# Patient Record
Sex: Female | Born: 1955
Health system: Southern US, Community
[De-identification: ages and names within clinical notes are randomized; demographics above are authoritative.]

## PROBLEM LIST (undated history)

## (undated) DIAGNOSIS — N951 Menopausal and female climacteric states: Secondary | ICD-10-CM

## (undated) DIAGNOSIS — N926 Irregular menstruation, unspecified: Secondary | ICD-10-CM

## (undated) DIAGNOSIS — IMO0001 Reserved for inherently not codable concepts without codable children: Secondary | ICD-10-CM

## (undated) DIAGNOSIS — N762 Acute vulvitis: Secondary | ICD-10-CM

## (undated) DIAGNOSIS — D869 Sarcoidosis, unspecified: Secondary | ICD-10-CM

## (undated) DIAGNOSIS — E538 Deficiency of other specified B group vitamins: Secondary | ICD-10-CM

## (undated) DIAGNOSIS — I872 Venous insufficiency (chronic) (peripheral): Secondary | ICD-10-CM

## (undated) DIAGNOSIS — I10 Essential (primary) hypertension: Secondary | ICD-10-CM

## (undated) DIAGNOSIS — B379 Candidiasis, unspecified: Secondary | ICD-10-CM

## (undated) DIAGNOSIS — Z8619 Personal history of other infectious and parasitic diseases: Secondary | ICD-10-CM

## (undated) DIAGNOSIS — R42 Dizziness and giddiness: Secondary | ICD-10-CM

## (undated) DIAGNOSIS — R51 Headache: Secondary | ICD-10-CM

## (undated) DIAGNOSIS — F419 Anxiety disorder, unspecified: Secondary | ICD-10-CM

## (undated) DIAGNOSIS — I219 Acute myocardial infarction, unspecified: Secondary | ICD-10-CM

## (undated) DIAGNOSIS — K589 Irritable bowel syndrome without diarrhea: Secondary | ICD-10-CM

## (undated) DIAGNOSIS — J209 Acute bronchitis, unspecified: Secondary | ICD-10-CM

## (undated) DIAGNOSIS — H8109 Meniere's disease, unspecified ear: Secondary | ICD-10-CM

## (undated) DIAGNOSIS — G4733 Obstructive sleep apnea (adult) (pediatric): Secondary | ICD-10-CM

## (undated) DIAGNOSIS — E119 Type 2 diabetes mellitus without complications: Secondary | ICD-10-CM

## (undated) DIAGNOSIS — K635 Polyp of colon: Secondary | ICD-10-CM

## (undated) DIAGNOSIS — Z8742 Personal history of other diseases of the female genital tract: Secondary | ICD-10-CM

## (undated) DIAGNOSIS — N200 Calculus of kidney: Secondary | ICD-10-CM

## (undated) DIAGNOSIS — N760 Acute vaginitis: Secondary | ICD-10-CM

## (undated) DIAGNOSIS — B009 Herpesviral infection, unspecified: Secondary | ICD-10-CM

## (undated) DIAGNOSIS — E7800 Pure hypercholesterolemia, unspecified: Secondary | ICD-10-CM

## (undated) DIAGNOSIS — I2510 Atherosclerotic heart disease of native coronary artery without angina pectoris: Secondary | ICD-10-CM

## (undated) DIAGNOSIS — M797 Fibromyalgia: Secondary | ICD-10-CM

## (undated) DIAGNOSIS — J309 Allergic rhinitis, unspecified: Secondary | ICD-10-CM

## (undated) DIAGNOSIS — B9689 Other specified bacterial agents as the cause of diseases classified elsewhere: Secondary | ICD-10-CM

## (undated) DIAGNOSIS — D649 Anemia, unspecified: Secondary | ICD-10-CM

## (undated) DIAGNOSIS — D219 Benign neoplasm of connective and other soft tissue, unspecified: Secondary | ICD-10-CM

## (undated) HISTORY — DX: Meniere's disease, unspecified ear: H81.09

## (undated) HISTORY — DX: Fibromyalgia: M79.7

## (undated) HISTORY — DX: Sarcoidosis, unspecified: D86.9

## (undated) HISTORY — DX: Calculus of kidney: N20.0

## (undated) HISTORY — PX: COLONOSCOPY: SHX174

## (undated) HISTORY — DX: Herpesviral infection, unspecified: B00.9

## (undated) HISTORY — DX: Anxiety disorder, unspecified: F41.9

## (undated) HISTORY — DX: Polyp of colon: K63.5

## (undated) HISTORY — DX: Essential (primary) hypertension: I10

## (undated) HISTORY — DX: Candidiasis, unspecified: B37.9

## (undated) HISTORY — DX: Atherosclerotic heart disease of native coronary artery without angina pectoris: I25.10

## (undated) HISTORY — DX: Acute myocardial infarction, unspecified: I21.9

## (undated) HISTORY — DX: Venous insufficiency (chronic) (peripheral): I87.2

## (undated) HISTORY — DX: Obstructive sleep apnea (adult) (pediatric): G47.33

## (undated) HISTORY — DX: Type 2 diabetes mellitus without complications: E11.9

## (undated) HISTORY — DX: Acute bronchitis, unspecified: J20.9

## (undated) HISTORY — PX: OTHER SURGICAL HISTORY: SHX169

## (undated) HISTORY — DX: Irritable bowel syndrome, unspecified: K58.9

## (undated) HISTORY — DX: Headache: R51

## (undated) HISTORY — DX: Other specified bacterial agents as the cause of diseases classified elsewhere: B96.89

## (undated) HISTORY — DX: Deficiency of other specified B group vitamins: E53.8

## (undated) HISTORY — DX: Pure hypercholesterolemia, unspecified: E78.00

## (undated) HISTORY — DX: Menopausal and female climacteric states: N95.1

## (undated) HISTORY — DX: Reserved for inherently not codable concepts without codable children: IMO0001

## (undated) HISTORY — DX: Anemia, unspecified: D64.9

## (undated) HISTORY — DX: Personal history of other infectious and parasitic diseases: Z86.19

## (undated) HISTORY — DX: Acute vaginitis: N76.0

## (undated) HISTORY — DX: Benign neoplasm of connective and other soft tissue, unspecified: D21.9

## (undated) HISTORY — PX: TONSILLECTOMY: SUR1361

## (undated) HISTORY — DX: Acute vulvitis: N76.2

## (undated) HISTORY — DX: Personal history of other diseases of the female genital tract: Z87.42

## (undated) HISTORY — DX: Allergic rhinitis, unspecified: J30.9

## (undated) HISTORY — DX: Dizziness and giddiness: R42

## (undated) HISTORY — DX: Irregular menstruation, unspecified: N92.6

---

## 1996-06-22 DIAGNOSIS — B9689 Other specified bacterial agents as the cause of diseases classified elsewhere: Secondary | ICD-10-CM

## 1996-06-22 HISTORY — DX: Other specified bacterial agents as the cause of diseases classified elsewhere: B96.89

## 1999-03-26 ENCOUNTER — Ambulatory Visit (HOSPITAL_COMMUNITY): Admission: RE | Admit: 1999-03-26 | Discharge: 1999-03-26 | Payer: Self-pay | Admitting: *Deleted

## 2000-11-10 ENCOUNTER — Ambulatory Visit (HOSPITAL_COMMUNITY): Admission: RE | Admit: 2000-11-10 | Discharge: 2000-11-10 | Payer: Self-pay | Admitting: *Deleted

## 2000-11-10 ENCOUNTER — Encounter: Payer: Self-pay | Admitting: *Deleted

## 2001-02-01 ENCOUNTER — Encounter: Payer: Self-pay | Admitting: Emergency Medicine

## 2001-02-01 ENCOUNTER — Emergency Department (HOSPITAL_COMMUNITY): Admission: EM | Admit: 2001-02-01 | Discharge: 2001-02-01 | Payer: Self-pay | Admitting: Emergency Medicine

## 2001-11-15 DIAGNOSIS — N951 Menopausal and female climacteric states: Secondary | ICD-10-CM

## 2001-11-15 DIAGNOSIS — D219 Benign neoplasm of connective and other soft tissue, unspecified: Secondary | ICD-10-CM

## 2001-11-15 DIAGNOSIS — N926 Irregular menstruation, unspecified: Secondary | ICD-10-CM

## 2001-11-15 HISTORY — DX: Menopausal and female climacteric states: N95.1

## 2001-11-15 HISTORY — DX: Irregular menstruation, unspecified: N92.6

## 2001-11-15 HISTORY — DX: Benign neoplasm of connective and other soft tissue, unspecified: D21.9

## 2002-08-02 ENCOUNTER — Other Ambulatory Visit: Admission: RE | Admit: 2002-08-02 | Discharge: 2002-08-02 | Payer: Self-pay | Admitting: Obstetrics and Gynecology

## 2002-08-15 ENCOUNTER — Ambulatory Visit (HOSPITAL_COMMUNITY): Admission: RE | Admit: 2002-08-15 | Discharge: 2002-08-15 | Payer: Self-pay | Admitting: Obstetrics and Gynecology

## 2002-08-15 ENCOUNTER — Encounter: Payer: Self-pay | Admitting: Obstetrics and Gynecology

## 2002-10-15 ENCOUNTER — Encounter: Payer: Self-pay | Admitting: Pulmonary Disease

## 2002-10-15 ENCOUNTER — Ambulatory Visit (HOSPITAL_COMMUNITY): Admission: RE | Admit: 2002-10-15 | Discharge: 2002-10-15 | Payer: Self-pay | Admitting: Pulmonary Disease

## 2003-11-07 ENCOUNTER — Other Ambulatory Visit: Admission: RE | Admit: 2003-11-07 | Discharge: 2003-11-07 | Payer: Self-pay | Admitting: Obstetrics and Gynecology

## 2004-10-23 ENCOUNTER — Ambulatory Visit: Payer: Self-pay | Admitting: Adult Health

## 2004-11-02 ENCOUNTER — Encounter: Admission: RE | Admit: 2004-11-02 | Discharge: 2005-01-31 | Payer: Self-pay | Admitting: Pulmonary Disease

## 2004-11-30 ENCOUNTER — Ambulatory Visit: Payer: Self-pay | Admitting: Pulmonary Disease

## 2004-12-21 ENCOUNTER — Other Ambulatory Visit: Admission: RE | Admit: 2004-12-21 | Discharge: 2004-12-21 | Payer: Self-pay | Admitting: Obstetrics and Gynecology

## 2004-12-21 ENCOUNTER — Ambulatory Visit: Payer: Self-pay | Admitting: Pulmonary Disease

## 2005-02-18 ENCOUNTER — Encounter: Admission: RE | Admit: 2005-02-18 | Discharge: 2005-05-19 | Payer: Self-pay | Admitting: Pulmonary Disease

## 2005-02-23 ENCOUNTER — Ambulatory Visit: Payer: Self-pay | Admitting: Pulmonary Disease

## 2005-04-30 ENCOUNTER — Ambulatory Visit (HOSPITAL_BASED_OUTPATIENT_CLINIC_OR_DEPARTMENT_OTHER): Admission: RE | Admit: 2005-04-30 | Discharge: 2005-04-30 | Payer: Self-pay | Admitting: Pulmonary Disease

## 2005-05-14 ENCOUNTER — Ambulatory Visit: Payer: Self-pay | Admitting: Pulmonary Disease

## 2005-12-01 ENCOUNTER — Ambulatory Visit: Payer: Self-pay | Admitting: Pulmonary Disease

## 2006-01-21 ENCOUNTER — Other Ambulatory Visit: Admission: RE | Admit: 2006-01-21 | Discharge: 2006-01-21 | Payer: Self-pay | Admitting: Obstetrics and Gynecology

## 2006-01-28 ENCOUNTER — Ambulatory Visit: Payer: Self-pay | Admitting: Pulmonary Disease

## 2006-02-16 ENCOUNTER — Ambulatory Visit: Payer: Self-pay | Admitting: Pulmonary Disease

## 2006-03-01 ENCOUNTER — Ambulatory Visit: Payer: Self-pay | Admitting: Pulmonary Disease

## 2006-03-14 ENCOUNTER — Ambulatory Visit: Payer: Self-pay

## 2006-05-11 ENCOUNTER — Ambulatory Visit (HOSPITAL_BASED_OUTPATIENT_CLINIC_OR_DEPARTMENT_OTHER): Admission: RE | Admit: 2006-05-11 | Discharge: 2006-05-11 | Payer: Self-pay | Admitting: Pulmonary Disease

## 2006-05-13 ENCOUNTER — Ambulatory Visit: Payer: Self-pay | Admitting: Pulmonary Disease

## 2006-06-10 ENCOUNTER — Ambulatory Visit: Payer: Self-pay | Admitting: Pulmonary Disease

## 2006-06-17 ENCOUNTER — Ambulatory Visit: Payer: Self-pay | Admitting: Pulmonary Disease

## 2006-08-01 ENCOUNTER — Ambulatory Visit: Payer: Self-pay | Admitting: Internal Medicine

## 2006-08-09 ENCOUNTER — Ambulatory Visit: Payer: Self-pay | Admitting: Cardiovascular Disease

## 2006-08-17 ENCOUNTER — Ambulatory Visit: Payer: Self-pay | Admitting: Pulmonary Disease

## 2006-09-21 ENCOUNTER — Ambulatory Visit: Payer: Self-pay | Admitting: Pulmonary Disease

## 2006-10-03 ENCOUNTER — Ambulatory Visit: Payer: Self-pay | Admitting: Pulmonary Disease

## 2006-11-10 ENCOUNTER — Ambulatory Visit: Payer: Self-pay | Admitting: Pulmonary Disease

## 2006-11-15 DIAGNOSIS — Z8742 Personal history of other diseases of the female genital tract: Secondary | ICD-10-CM

## 2006-11-15 HISTORY — DX: Personal history of other diseases of the female genital tract: Z87.42

## 2007-03-21 ENCOUNTER — Ambulatory Visit: Payer: Self-pay | Admitting: Pulmonary Disease

## 2007-04-26 ENCOUNTER — Emergency Department (HOSPITAL_COMMUNITY): Admission: EM | Admit: 2007-04-26 | Discharge: 2007-04-26 | Payer: Self-pay | Admitting: Emergency Medicine

## 2007-04-27 ENCOUNTER — Ambulatory Visit: Payer: Self-pay | Admitting: Pulmonary Disease

## 2007-04-27 LAB — CONVERTED CEMR LAB
ALT: 15 units/L (ref 0–40)
AST: 21 units/L (ref 0–37)
Alkaline Phosphatase: 55 units/L (ref 39–117)
BUN: 11 mg/dL (ref 6–23)
Basophils Relative: 0.7 % (ref 0.0–1.0)
Bilirubin, Direct: 0.1 mg/dL (ref 0.0–0.3)
CO2: 26 meq/L (ref 19–32)
Calcium: 9.3 mg/dL (ref 8.4–10.5)
Chloride: 110 meq/L (ref 96–112)
Eosinophils Relative: 0.7 % (ref 0.0–5.0)
GFR calc Af Amer: 113 mL/min
Glucose, Bld: 100 mg/dL — ABNORMAL HIGH (ref 70–99)
Lymphocytes Relative: 31.4 % (ref 12.0–46.0)
Monocytes Relative: 12.8 % — ABNORMAL HIGH (ref 3.0–11.0)
Platelets: 279 10*3/uL (ref 150–400)
RBC: 4.05 M/uL (ref 3.87–5.11)
RDW: 13.5 % (ref 11.5–14.6)
TSH: 1.7 microintl units/mL (ref 0.35–5.50)
Total Protein: 6.4 g/dL (ref 6.0–8.3)
WBC: 5.4 10*3/uL (ref 4.5–10.5)

## 2007-04-28 ENCOUNTER — Ambulatory Visit: Payer: Self-pay

## 2007-05-05 ENCOUNTER — Emergency Department (HOSPITAL_COMMUNITY): Admission: EM | Admit: 2007-05-05 | Discharge: 2007-05-05 | Payer: Self-pay | Admitting: Emergency Medicine

## 2007-05-10 ENCOUNTER — Encounter (HOSPITAL_BASED_OUTPATIENT_CLINIC_OR_DEPARTMENT_OTHER): Admission: RE | Admit: 2007-05-10 | Discharge: 2007-05-26 | Payer: Self-pay | Admitting: Internal Medicine

## 2007-07-31 ENCOUNTER — Ambulatory Visit: Payer: Self-pay | Admitting: Pulmonary Disease

## 2007-07-31 LAB — CONVERTED CEMR LAB
Bilirubin Urine: NEGATIVE
Calcium: 9.2 mg/dL (ref 8.4–10.5)
Chloride: 111 meq/L (ref 96–112)
Creatinine, Ser: 0.7 mg/dL (ref 0.4–1.2)
GFR calc non Af Amer: 93 mL/min
Hgb A1c MFr Bld: 6.6 % — ABNORMAL HIGH (ref 4.6–6.0)
Leukocytes, UA: NEGATIVE
Specific Gravity, Urine: <=1.005 (ref 1.000–1.03)
Total Protein, Urine: NEGATIVE mg/dL
Urobilinogen, UA: 0.2 (ref 0.0–1.0)
pH: 6.5 (ref 5.0–8.0)

## 2007-08-02 ENCOUNTER — Ambulatory Visit: Payer: Self-pay | Admitting: Internal Medicine

## 2007-09-16 DIAGNOSIS — K589 Irritable bowel syndrome without diarrhea: Secondary | ICD-10-CM | POA: Insufficient documentation

## 2007-09-16 DIAGNOSIS — IMO0001 Reserved for inherently not codable concepts without codable children: Secondary | ICD-10-CM | POA: Insufficient documentation

## 2007-09-16 DIAGNOSIS — F411 Generalized anxiety disorder: Secondary | ICD-10-CM | POA: Insufficient documentation

## 2007-09-16 DIAGNOSIS — E7800 Pure hypercholesterolemia, unspecified: Secondary | ICD-10-CM | POA: Insufficient documentation

## 2007-09-16 DIAGNOSIS — I10 Essential (primary) hypertension: Secondary | ICD-10-CM | POA: Insufficient documentation

## 2007-09-16 DIAGNOSIS — J309 Allergic rhinitis, unspecified: Secondary | ICD-10-CM | POA: Insufficient documentation

## 2007-11-07 ENCOUNTER — Encounter: Payer: Self-pay | Admitting: Pulmonary Disease

## 2007-11-16 DIAGNOSIS — B009 Herpesviral infection, unspecified: Secondary | ICD-10-CM

## 2007-11-16 HISTORY — DX: Herpesviral infection, unspecified: B00.9

## 2007-12-12 ENCOUNTER — Ambulatory Visit: Payer: Self-pay | Admitting: Pulmonary Disease

## 2007-12-12 DIAGNOSIS — R42 Dizziness and giddiness: Secondary | ICD-10-CM | POA: Insufficient documentation

## 2007-12-12 DIAGNOSIS — I872 Venous insufficiency (chronic) (peripheral): Secondary | ICD-10-CM | POA: Insufficient documentation

## 2007-12-12 DIAGNOSIS — N200 Calculus of kidney: Secondary | ICD-10-CM | POA: Insufficient documentation

## 2007-12-13 ENCOUNTER — Ambulatory Visit: Payer: Self-pay | Admitting: Pulmonary Disease

## 2007-12-13 LAB — CONVERTED CEMR LAB
ALT: 14 units/L (ref 0–35)
Albumin: 3.4 g/dL — ABNORMAL LOW (ref 3.5–5.2)
BUN: 7 mg/dL (ref 6–23)
Basophils Absolute: 0.1 10*3/uL (ref 0.0–0.1)
Bilirubin, Direct: 0.2 mg/dL (ref 0.0–0.3)
Calcium: 9 mg/dL (ref 8.4–10.5)
Eosinophils Absolute: 0.1 10*3/uL (ref 0.0–0.6)
GFR calc Af Amer: 96 mL/min
GFR calc non Af Amer: 80 mL/min
Glucose, Bld: 112 mg/dL — ABNORMAL HIGH (ref 70–99)
HDL: 48.5 mg/dL (ref >39.0)
Lymphocytes Relative: 27.1 % (ref 12.0–46.0)
MCHC: 33.3 g/dL (ref 30.0–36.0)
MCV: 88.2 fL (ref 78.0–100.0)
Monocytes Relative: 9.9 % (ref 3.0–11.0)
Neutro Abs: 3.8 10*3/uL (ref 1.4–7.7)
Platelets: 293 10*3/uL (ref 150–400)
RBC: 4.44 M/uL (ref 3.87–5.11)
Total CHOL/HDL Ratio: 3.2
Triglycerides: 122 mg/dL (ref 0–149)

## 2007-12-15 ENCOUNTER — Ambulatory Visit: Payer: Self-pay | Admitting: Cardiology

## 2008-02-20 ENCOUNTER — Encounter: Payer: Self-pay | Admitting: Pulmonary Disease

## 2008-02-28 ENCOUNTER — Encounter: Payer: Self-pay | Admitting: Pulmonary Disease

## 2008-03-06 ENCOUNTER — Telehealth: Payer: Self-pay | Admitting: Pulmonary Disease

## 2008-03-08 ENCOUNTER — Telehealth (INDEPENDENT_AMBULATORY_CARE_PROVIDER_SITE_OTHER): Payer: Self-pay | Admitting: *Deleted

## 2008-04-10 ENCOUNTER — Encounter: Payer: Self-pay | Admitting: Pulmonary Disease

## 2008-07-18 ENCOUNTER — Ambulatory Visit (HOSPITAL_COMMUNITY): Admission: RE | Admit: 2008-07-18 | Discharge: 2008-07-18 | Payer: Self-pay | Admitting: Urology

## 2008-07-25 ENCOUNTER — Encounter: Payer: Self-pay | Admitting: Pulmonary Disease

## 2008-09-05 ENCOUNTER — Telehealth (INDEPENDENT_AMBULATORY_CARE_PROVIDER_SITE_OTHER): Payer: Self-pay | Admitting: *Deleted

## 2008-09-09 ENCOUNTER — Telehealth: Payer: Self-pay | Admitting: Pulmonary Disease

## 2008-09-10 ENCOUNTER — Ambulatory Visit: Payer: Self-pay | Admitting: Pulmonary Disease

## 2008-09-10 DIAGNOSIS — J209 Acute bronchitis, unspecified: Secondary | ICD-10-CM | POA: Insufficient documentation

## 2008-11-15 DIAGNOSIS — N762 Acute vulvitis: Secondary | ICD-10-CM

## 2008-11-15 HISTORY — DX: Acute vulvitis: N76.2

## 2008-11-15 LAB — HM COLONOSCOPY: HM Colonoscopy: NORMAL

## 2008-12-04 ENCOUNTER — Telehealth (INDEPENDENT_AMBULATORY_CARE_PROVIDER_SITE_OTHER): Payer: Self-pay | Admitting: *Deleted

## 2008-12-23 ENCOUNTER — Encounter: Payer: Self-pay | Admitting: Pulmonary Disease

## 2009-02-05 ENCOUNTER — Ambulatory Visit: Payer: Self-pay | Admitting: Pulmonary Disease

## 2009-02-05 DIAGNOSIS — E559 Vitamin D deficiency, unspecified: Secondary | ICD-10-CM | POA: Insufficient documentation

## 2009-02-07 ENCOUNTER — Encounter (INDEPENDENT_AMBULATORY_CARE_PROVIDER_SITE_OTHER): Payer: Self-pay | Admitting: *Deleted

## 2009-02-18 ENCOUNTER — Encounter: Payer: Self-pay | Admitting: Pulmonary Disease

## 2009-02-24 ENCOUNTER — Telehealth (INDEPENDENT_AMBULATORY_CARE_PROVIDER_SITE_OTHER): Payer: Self-pay | Admitting: *Deleted

## 2009-02-24 ENCOUNTER — Encounter: Payer: Self-pay | Admitting: Pulmonary Disease

## 2009-02-24 LAB — CONVERTED CEMR LAB
BUN: 10 mg/dL (ref 6–23)
CO2: 26 meq/L (ref 19–32)
Chloride: 105 meq/L (ref 96–112)
Cholesterol: 170 mg/dL (ref 0–200)
HDL: 58.7 mg/dL (ref >39.00)
Hgb A1c MFr Bld: 6.6 % — ABNORMAL HIGH (ref 4.6–6.5)
Potassium: 3.8 meq/L (ref 3.5–5.1)
VLDL: 18.8 mg/dL (ref 0.0–40.0)

## 2009-02-27 ENCOUNTER — Encounter: Payer: Self-pay | Admitting: Pulmonary Disease

## 2009-03-07 ENCOUNTER — Inpatient Hospital Stay (HOSPITAL_BASED_OUTPATIENT_CLINIC_OR_DEPARTMENT_OTHER): Admission: RE | Admit: 2009-03-07 | Discharge: 2009-03-07 | Payer: Self-pay | Admitting: Cardiology

## 2009-04-16 ENCOUNTER — Encounter: Payer: Self-pay | Admitting: Pulmonary Disease

## 2009-04-30 ENCOUNTER — Ambulatory Visit: Payer: Self-pay | Admitting: Pulmonary Disease

## 2009-04-30 DIAGNOSIS — H8109 Meniere's disease, unspecified ear: Secondary | ICD-10-CM | POA: Insufficient documentation

## 2009-05-07 ENCOUNTER — Encounter: Payer: Self-pay | Admitting: Pulmonary Disease

## 2009-08-04 ENCOUNTER — Telehealth (INDEPENDENT_AMBULATORY_CARE_PROVIDER_SITE_OTHER): Payer: Self-pay | Admitting: *Deleted

## 2009-08-27 ENCOUNTER — Ambulatory Visit: Payer: Self-pay | Admitting: Pulmonary Disease

## 2009-09-16 ENCOUNTER — Telehealth: Payer: Self-pay | Admitting: Pulmonary Disease

## 2009-11-24 ENCOUNTER — Encounter: Payer: Self-pay | Admitting: Pulmonary Disease

## 2010-01-22 ENCOUNTER — Encounter: Payer: Self-pay | Admitting: Pulmonary Disease

## 2010-02-08 ENCOUNTER — Emergency Department (HOSPITAL_COMMUNITY): Admission: EM | Admit: 2010-02-08 | Discharge: 2010-02-08 | Payer: Self-pay | Admitting: Family Medicine

## 2010-02-10 ENCOUNTER — Encounter: Payer: Self-pay | Admitting: Pulmonary Disease

## 2010-02-13 ENCOUNTER — Ambulatory Visit: Payer: Self-pay | Admitting: Pulmonary Disease

## 2010-02-16 ENCOUNTER — Telehealth: Payer: Self-pay | Admitting: Pulmonary Disease

## 2010-02-16 LAB — CONVERTED CEMR LAB
BUN: 9 mg/dL (ref 6–23)
Basophils Absolute: 0 10*3/uL (ref 0.0–0.1)
CO2: 28 meq/L (ref 19–32)
Calcium: 9.2 mg/dL (ref 8.4–10.5)
Glucose, Bld: 124 mg/dL — ABNORMAL HIGH (ref 70–99)
HDL: 67.5 mg/dL (ref >39.00)
LDL Cholesterol: 63 mg/dL (ref 0–99)
Lymphocytes Relative: 25.7 % (ref 12.0–46.0)
Monocytes Relative: 7.7 % (ref 3.0–12.0)
Neutrophils Relative %: 65.5 % (ref 43.0–77.0)
Platelets: 312 10*3/uL (ref 150.0–400.0)
Potassium: 3.6 meq/L (ref 3.5–5.1)
RDW: 13.9 % (ref 11.5–14.6)
Sodium: 136 meq/L (ref 135–145)
TSH: 1.13 microintl units/mL (ref 0.35–5.50)
Total CHOL/HDL Ratio: 2
Triglycerides: 74 mg/dL (ref 0.0–149.0)
WBC: 7.6 10*3/uL (ref 4.5–10.5)

## 2010-02-20 ENCOUNTER — Encounter: Admission: RE | Admit: 2010-02-20 | Discharge: 2010-05-13 | Payer: Self-pay | Admitting: Pulmonary Disease

## 2010-03-02 ENCOUNTER — Encounter: Admission: RE | Admit: 2010-03-02 | Discharge: 2010-05-31 | Payer: Self-pay | Admitting: Pulmonary Disease

## 2010-03-02 ENCOUNTER — Encounter: Payer: Self-pay | Admitting: Pulmonary Disease

## 2010-03-03 ENCOUNTER — Encounter: Payer: Self-pay | Admitting: Pulmonary Disease

## 2010-03-09 ENCOUNTER — Encounter: Payer: Self-pay | Admitting: Pulmonary Disease

## 2010-04-20 ENCOUNTER — Telehealth: Payer: Self-pay | Admitting: Pulmonary Disease

## 2010-04-23 ENCOUNTER — Telehealth (INDEPENDENT_AMBULATORY_CARE_PROVIDER_SITE_OTHER): Payer: Self-pay | Admitting: *Deleted

## 2010-05-08 ENCOUNTER — Encounter: Payer: Self-pay | Admitting: Pulmonary Disease

## 2010-05-08 ENCOUNTER — Ambulatory Visit: Payer: Self-pay | Admitting: Endocrinology

## 2010-06-16 ENCOUNTER — Encounter: Payer: Self-pay | Admitting: Pulmonary Disease

## 2010-08-14 ENCOUNTER — Ambulatory Visit: Payer: Self-pay | Admitting: Pulmonary Disease

## 2010-08-15 LAB — CONVERTED CEMR LAB
Basophils Relative: 0.6 % (ref 0.0–3.0)
Chloride: 103 meq/L (ref 96–112)
Creatinine, Ser: 0.7 mg/dL (ref 0.4–1.2)
Eosinophils Relative: 1.7 % (ref 0.0–5.0)
HCT: 35 % — ABNORMAL LOW (ref 36.0–46.0)
Hemoglobin: 11.6 g/dL — ABNORMAL LOW (ref 12.0–15.0)
MCV: 87.2 fL (ref 78.0–100.0)
Monocytes Absolute: 0.7 10*3/uL (ref 0.1–1.0)
Neutrophils Relative %: 66.6 % (ref 43.0–77.0)
Potassium: 4 meq/L (ref 3.5–5.1)
RBC: 4.01 M/uL (ref 3.87–5.11)
Sodium: 138 meq/L (ref 135–145)
WBC: 7.3 10*3/uL (ref 4.5–10.5)

## 2010-08-17 ENCOUNTER — Ambulatory Visit: Payer: Self-pay | Admitting: Endocrinology

## 2010-09-23 ENCOUNTER — Encounter
Admission: RE | Admit: 2010-09-23 | Discharge: 2010-09-23 | Payer: Self-pay | Source: Home / Self Care | Attending: Pulmonary Disease | Admitting: Pulmonary Disease

## 2010-10-12 ENCOUNTER — Telehealth (INDEPENDENT_AMBULATORY_CARE_PROVIDER_SITE_OTHER): Payer: Self-pay | Admitting: *Deleted

## 2010-11-23 ENCOUNTER — Ambulatory Visit
Admission: RE | Admit: 2010-11-23 | Discharge: 2010-11-23 | Payer: Self-pay | Source: Home / Self Care | Attending: Endocrinology | Admitting: Endocrinology

## 2010-11-23 ENCOUNTER — Other Ambulatory Visit: Payer: Self-pay | Admitting: Endocrinology

## 2010-11-23 LAB — HEMOGLOBIN A1C: Hgb A1c MFr Bld: 7.2 % — ABNORMAL HIGH (ref 4.6–6.5)

## 2010-12-07 ENCOUNTER — Telehealth (INDEPENDENT_AMBULATORY_CARE_PROVIDER_SITE_OTHER): Payer: Self-pay | Admitting: *Deleted

## 2010-12-17 NOTE — Assessment & Plan Note (Signed)
Summary: f/u appt/#?cd   Vital Signs:  Patient profile:   55 year old female Height:      62 inches (157.48 cm) Weight:      197.4 pounds (89.73 kg) O2 Sat:      96 % on Room air Temp:     98.0 degrees F (36.67 degrees C) oral Pulse rate:   78 / minute BP sitting:   120 / 72  (left arm) Cuff size:   large  Vitals Entered By: Orlan Leavens RMA (August 17, 2010 3:29 PM)  O2 Flow:  Room air CC: follow-up visit Is Patient Diabetic? Yes Did you bring your meter with you today? No   Referring Provider:  nadel  CC:  follow-up visit.  History of Present Illness: pt stopped januvia due to nausea.  she is back on the amaryl.  she did not tolerate actos due to weight gain.  she wants generic only.  no cbg record, but states cbg's are well-controlled.  Current Medications (verified): 1)  Xopenex Hfa 45 Mcg/act Aero (Levalbuterol Tartrate) .... Inhale 1-2 Sprays Every 6 H As Needed For Wheezing... 2)  Adult Aspirin Low Strength 81 Mg  Tbdp (Aspirin) .[name].. 1 Tab Daily.Marland KitchenMarland Kitchen 3)  Isosorbide Mononitrate Cr 120 Mg Xr24h-Tab (Isosorbide Mononitrate) .... Take 1 Tablet By Mouth Once A Day 4)  Metoprolol Succinate 50 Mg Xr24h-Tab (Metoprolol Succinate) .... Take 1 1/2  Tab By Mouth Once Daily.Marland KitchenMarland Kitchen 5)  Cardizem Cd 240 Mg Xr24h-Cap (Diltiazem Hcl Coated Beads) .... Take 1 Tablet By Mouth Once A Day 6)  Lasix 20 Mg Tabs (Furosemide) .... Take 1 Tab By Mouth Once Daily.Marland KitchenMarland Kitchen 7)  Lipitor 40 Mg Tabs (Atorvastatin Calcium) .... Take 1 Tablet By Mouth Once A Day 8)  Zetia 10 Mg Tabs (Ezetimibe) .... Take 1 Tab By Mouth Once Daily.Marland KitchenMarland Kitchen 9)  Coq10 100 Mg Caps (Coenzyme Q10) .... Take 2 Capsule By Mouth Once Daily 10)  Metformin Hcl 500 Mg Xr24h-Tab (Metformin Hcl) .... Pill Two Times A Day 11)  Protonix 40 Mg Tbec (Pantoprazole Sodium) .... Take 1 Tab By Mouth Once Daily- 30 Min Before The First Meal of The Day... 12)  Alprazolam 0.5 Mg  Tabs (Alprazolam) .... Take 1/2 Tab By Mouth Mid Morning & Mid Afternoon, The 1 Tab  At Bedtime... 13)  Antivert 25 Mg  Tabs (Meclizine Hcl) .... 1/2 To 1 Tab By Mouth Q4h As Needed For Dizziness... 14)  Augmentin 875-125 Mg Tabs (Amoxicillin-Pot Clavulanate) .... Take 1 Tab By Mouth Two Times A Day... 15)  Promethazine-Codeine 6.25-10 Mg/7ml Syrp (Promethazine-Codeine) .... Take 1 Tsp By Mouth Every 6 H As Needed For Cough  Allergies (verified): 1)  ! Actos (Pioglitazone Hcl) 2)  Codeine 3)  Morphine  Past History:  Past Medical History: Last updated: 08/14/2010 MENIERE'S DISEASE (ICD-386.00) ALLERGIC RHINITIS (ICD-477.9) ASTHMATIC BRONCHITIS, ACUTE (ICD-466.0) Hx of SARCOIDOSIS (ICD-135) Hx of OBSTRUCTIVE SLEEP APNEA (ICD-327.23) HYPERTENSION, BENIGN (ICD-401.1) CORONARY ARTERY DISEASE (ICD-414.00) VENOUS INSUFFICIENCY (ICD-459.81) HYPERCHOLESTEROLEMIA (ICD-272.0) DIABETES MELLITUS (ICD-250.00) IRRITABLE BOWEL SYNDROME (ICD-564.1) RENAL CALCULUS (ICD-592.0) FIBROMYALGIA (ICD-729.1) VITAMIN D DEFICIENCY (ICD-268.9) DIZZINESS (ICD-780.4) ANXIETY (ICD-300.00)  Review of Systems       she has lost a few lbs, due to her efforts  Physical Exam  General:  normal appearance.   Extremities:  trace right pedal edema and trace left pedal edema.   Additional Exam:   Hemoglobin A1C       [H]  7.3 %          Impression &  Recommendations:  Problem # 1:  DIABETES MELLITUS (ICD-250.00) needs increased rx  Medications Added to Medication List This Visit: 1)  Bromocriptine Mesylate 2.5 Mg Tabs (Bromocriptine mesylate) .[name].. 1 tab at bedtime  Other Orders: Est. Patient Level III ([address])  Patient Instructions: 1)  change amaryl to bromocriptine 2.5 mg at bedtime.  to start with, just take 1/2 pill, until you are sure there is no nausea. 2)  another option is to change zetia to welchol. 3)  return january 2012 Prescriptions: BROMOCRIPTINE MESYLATE 2.5 MG TABS (BROMOCRIPTINE MESYLATE) 1 tab at bedtime  #30 x 11   Entered and Authorized by:   Minus Breeding MD    Signed by:   Minus Breeding MD on 08/17/2010   Method used:   Electronically to        [facility]. [phone]* (retail)       [address]       [place], [place]  [address]       Ph: [phone]       Fax: [phone]   RxID:   [id number]

## 2010-12-17 NOTE — Assessment & Plan Note (Signed)
Summary: new endo/diabetes/nadel/#/cd   Vital Signs:  Patient profile:   55 year old female Height:      62 inches (157.48 cm) Weight:      197.25 pounds (89.66 kg) BMI:     36.21 O2 Sat:      96 % on Room air Temp:     98.8 degrees F (37.11 degrees C) oral Pulse rate:   76 / minute BP sitting:   132 / 84  (left arm) Cuff size:   large  Vitals Entered By: Brenton Grills MA (May 08, 2010 2:19 PM)  O2 Flow:  Room air CC: new endo pt/diabetes/pt states she is no longer taking xopenex/pt has stopped taking glimepiride because it has dropped bs/aj   Referring Provider:  nadel  CC:  new endo pt/diabetes/pt states she is no longer taking xopenex/pt has stopped taking glimepiride because it has dropped bs/aj.  History of Present Illness: pt states 4 years h/o dm.  it is complicated by cad.  she has never been on insulin .  she had to reduce metformin due to dyspeptic sxs, and had to go off glimepiride due to (mild) hypoglycemia.   pt says her diet and exercise are "better now."   symptomatically, pt states few years of slight discoloration of the feet, but no assoc numbness.   Current Medications (verified): 1)  Xopenex Hfa 45 Mcg/act Aero (Levalbuterol Tartrate) .... Inhale 1-2 Sprays Every 6 H As Needed For Wheezing... 2)  Adult Aspirin Low Strength 81 Mg  Tbdp (Aspirin) .[name].. 1 Tab Daily.Marland KitchenMarland Kitchen 3)  Imdur 30 Mg Xr24h-Tab (Isosorbide Mononitrate) .[name].. 1 By Mouth Once Daily 4)  Metoprolol Succinate 50 Mg Xr24h-Tab (Metoprolol Succinate) .... Take 1 1/2  Tab By Mouth Once Daily.Marland KitchenMarland Kitchen 5)  Cardizem Cd 240 Mg Xr24h-Cap (Diltiazem Hcl Coated Beads) .... Take 1 Tablet By Mouth Once A Day 6)  Lasix 20 Mg Tabs (Furosemide) .... Take 1 Tab By Mouth Once Daily.Marland KitchenMarland Kitchen 7)  Lipitor 20 Mg Tabs (Atorvastatin Calcium) .... Take 1 Tablet By Mouth Once A Day 8)  Metformin Hcl 500 Mg  Tabs (Metformin Hcl) .... Take 1 Tablet By Mouth Two Times A Day 9)  Alprazolam 0.5 Mg  Tabs (Alprazolam) .... Take 1/2 Tab By  Mouth Mid Morning & Mid Afternoon, The 1 Tab At Bedtime... 10)  Antivert 25 Mg  Tabs (Meclizine Hcl) .... 1/2 To 1 Tab By Mouth Q4h As Needed For Dizziness... 11)  Protonix 40 Mg Tbec (Pantoprazole Sodium) .... Take 1 Tab By Mouth Once Daily- 30 Min Before The First Meal of The Day... 12)  Glimepiride 1 Mg Tabs (Glimepiride) .... Take 1 Tablet By Mouth Once A Day  Allergies (verified): 1)  ! Actos (Pioglitazone Hcl) 2)  Codeine 3)  Morphine  Past History:  Past Medical History: Last updated: 02/13/2010  MENIERE'S DISEASE (ICD-386.00) ALLERGIC RHINITIS (ICD-477.9) ASTHMATIC BRONCHITIS, ACUTE (ICD-466.0) Hx of SARCOIDOSIS (ICD-135) Hx of OBSTRUCTIVE SLEEP APNEA (ICD-327.23) HYPERTENSION, BENIGN (ICD-401.1) CORONARY ARTERY DISEASE (ICD-414.00) VENOUS INSUFFICIENCY (ICD-459.81) HYPERCHOLESTEROLEMIA (ICD-272.0) DIABETES MELLITUS (ICD-250.00) IRRITABLE BOWEL SYNDROME (ICD-564.1) RENAL CALCULUS (ICD-592.0) FIBROMYALGIA (ICD-729.1) VITAMIN D DEFICIENCY (ICD-268.9) DIZZINESS (ICD-780.4) ANXIETY (ICD-300.00)  Family History: Reviewed history from 02/13/2010 and no changes required. pt is adopted  Social History: Reviewed history from 02/13/2010 and no changes required. single 1 child never smoked exercises 2-3 times weekly caffeine use  1 daily--tea or pepsi no alcohol use works at [employer]  Review of Systems       The patient complains of headaches.  denies blurry vision, sob, n/v, cramps, excessive diaphoresis, memory loss, menopausal sxs, rhinorrhea, and easy bruising.  she reports fatigue, polyuria (due to lasix), mild depression, and slight pedal edema.  she has lost a few lbs, due to her efforts.  cardiol has eval her chest pain.  Physical Exam  General:  obese.  no distress Head:  head: no deformity eyes: no periorbital swelling, no proptosis external nose and ears are normal mouth: no lesion seen Neck:  Supple without thyroid enlargement or tenderness.    Lungs:  Clear to auscultation bilaterally. Normal respiratory effort.  Heart:  Regular rate and rhythm without murmurs or gallops noted. Normal S1,S2.   Abdomen:  abdomen is soft, nontender.  no hepatosplenomegaly.   not distended.  no hernia  Msk:  muscle bulk and strength are grossly normal.  no obvious joint swelling.  gait is normal and steady  Pulses:  dorsalis pedis intact bilat.  no carotid bruit  Extremities:  no deformity.  no ulcer on the feet.  feet are of normal color (except for varicosities).  and temp.  trace right pedal edema and trace left pedal edema.   Neurologic:  cn 2-12 grossly intact.   readily moves all 4's.   sensation is intact to touch on the feet Skin:  normal texture and temp.  no rash.  not diaphoretic  Cervical Nodes:  No significant adenopathy.  Psych:  Alert and cooperative; normal mood and affect; normal attention span and concentration.   Additional Exam:  Hemoglobin A1C       [H]  7.6 %    Impression & Recommendations:  Problem # 1:  DIABETES MELLITUS (ICD-250.00) needs increased rx  Problem # 2:  CORONARY ARTERY DISEASE (ICD-414.00) in this context, it is important to avoid hypoglycemia  Problem # 3:  VENOUS INSUFFICIENCY (ICD-459.81) a relative contraindication to actos  Medications Added to Medication List This Visit: 1)  Metformin Hcl 500 Mg Xr24h-tab (Metformin hcl) .... Pill two times a day 2)  Januvia 100 Mg Tabs (Sitagliptin phosphate) .[name].. 1 once daily  Other Orders: Consultation Level IV ([address])  Patient Instructions: 1)  good diet and exercise habits significanly improve the control of your diabetes.  please let me know if you wish to be referred to a dietician.  high blood sugar is very risky to your health.  you should see an eye doctor every year. 2)  controlling your blood pressure and cholesterol drastically reduces the damage diabetes does to your body.  this also applies to quitting smoking.  please discuss these with your  doctor.  you should take an aspirin every day, unless you have been advised by a doctor not to. 3)  check your blood sugar 1 time a day.  vary the time of day when you check, between before the 3 meals, and at bedtime.  also check if you have symptoms of your blood sugar being too high or too low.  please keep a record of the readings and bring it to your next appointment here.  please call us sooner if you are having low blood sugar episodes. 4)  for now, change metformin to -xr 500 mg two times a day 5)  add januvia 100 mg each am. 6)  Please schedule a follow-up appointment in 3 months. Prescriptions: METFORMIN HCL 500 MG XR24H-TAB (METFORMIN HCL) pill two times a day  #60 x 11   Entered and Authorized by:   Minus Breeding MD   Signed by:  Minus Breeding MD on 05/08/2010   Method used:   Print then Give to Patient   RxID:   [id number] JANUVIA 100 MG TABS (SITAGLIPTIN PHOSPHATE) 1 once daily  #30 x 11   Entered and Authorized by:   Minus Breeding MD   Signed by:   Minus Breeding MD on 05/08/2010   Method used:   Electronically to        [facility]. [phone]* (retail)       [address]       [place], [place]  [address]       Ph: [phone]       Fax: [phone]   RxID:   [id number] METFORMIN HCL 500 MG XR24H-TAB (METFORMIN HCL) pill two times a day  #60 x 11   Entered and Authorized by:   Minus Breeding MD   Signed by:   Minus Breeding MD on 05/08/2010   Method used:   Electronically to        [facility]. [phone]* (retail)       [address]       [place], [place]  [address]       Ph: [phone]       Fax: [phone]   RxID:   [id number]

## 2010-12-17 NOTE — Medication Information (Signed)
Summary: Tax adviser   Imported By: Valinda Hoar 11/24/2009 11:09:12  _____________________________________________________________________  External Attachment:    Type:   Image     Comment:   External Document

## 2010-12-17 NOTE — Letter (Signed)
Summary: Care Consideration for screening/Health Smart  Care Consideration for screening/Health Smart   Imported By: Sherian Rein 03/30/2010 11:39:27  _____________________________________________________________________  External Attachment:    Type:   Image     Comment:   External Document

## 2010-12-17 NOTE — Assessment & Plan Note (Signed)
Summary: 6 months/apc   CC:  6 month ROV & review of mult medical problems....  History of Present Illness: 55 y/o [name] here for a follow up visit... she has multiple medical problems as noted below...     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 she suddenly got dizzy, felt like she was going to pass out (never lost consciousness), pulled car over but was still in the road partially blocking traffic, got nauseated, sweaty, no vomiting, no CP or palpit... co-workers came to her aide and got her car to work, checked her sugar (130), she felt exhausted, rested for awhile and the whole thing was over in about 2H... she rested and woke w/ a headache stating that her head "felt off" & sl dizzy... eval included:   Labs-  normal;  CTBrain-  negative;  Neurology eval by DrWillis- vertigo episode w/ HA;  MRI/ MRA-  we don't have these results from their office (MRI from 2003 showed sm vessel dis & otherw neg);  Brainstem Auditory Evoked Potentials-  WNL...  Rx= ASA daily.  ~  Oct09:  she has participated in a [facility] heart study w/ EKG= WNL, and labs- all look good w/ norm FLP, sl low VitD @ 30, XRays w/ 5mm left UPJ stone- s/p lithotripsy by DrNesi...   ~  Feb-Mar10:  further dizziness eval from ENT, DrBates= ?Meniere's dis... he rec treatment w/ low sodium diet, Dyazide, & Valium... sl better, she says... but BP's have been elevated in the 150/100 range, feeling chest tightness, and anxious... she would like eval by Cards & several co-workers see  DrTurner.  ~  Jun10:  she had thorough Cards eval by DrTurner w/ Myoview (neg- breast attenuation, no regional wall motion abn, EF= 75%) & Cath (90% small 2nd diag branch of LAD <too sm for PTCA> & luminal irreg up to 20% in RCA, +DD)> Med Rx w/ incr ToprolXL, +Cardizem, +Imdur, incr Lipitor, & ch Dyazide to Lasix20... DrTurner is planning f/u FLP...  ~  Oct10:  she's disappointed that she  hasn't lost weight after diet [place] [facility] [place]... she is sure that her cardiac meds are causing weight gain & she has discussed this w/ DrTurner who rec that she continue the same Rx... given encouragement & told to keep it up... she notes 2 new problems: 1) heartburn/ reflux symptoms- we will Rx w/ PROTONIX;  2) she notes swelling around the lateral malleoli bilat= ?lipomas (they are non-tender, no decr ROM, etc; but she would like ortho eval- refer to [facility])...   ~  February 13, 2010:  she was seen at [facility] 3/27 w/ Vertigo & given shot for nausea  & Rx for Zofran ODT 4mg  Prn... she has had prev thorough evals by Neuro & ENT for dizziness> wonders if Vestib Therapy would be helpful & we will refer... she had recent f/u DrTurner w/ labs- ?FLP & she added Zetia but pt is concerned about $$... BP remains controlled, but she is disappointed that she has gained 6# to 209# today- remains on Metformin 500/d & A1c= 7.6, therefore incr to Bid.   ~  August 14, 2010:  she had nutrition counselling to [facility] & wt is down to 199# now... she wanted Endocrine consult & saw DrEllison 6/11 w/ rec for adding Januvia 100mg /d... she states that she's intol to the Januvia & wonders about ch  to Tajenta since her daugh works for [employer] (she will discuss w/ DrEllison during up=coming visit)...  noted more chest tightness & had another Myoview from DrTurner 8/11- it too was neg, no ischemia, no infarct; & her IMDUR has been incr to 120mg /d... DrTurner follows her Lipids and also incr her Lipitor to 40mg /d & added Zetia 10mg  & CoQ10- she doesn't want [name] to check her FLP, DrTurner does this... she had further dizziness (Meniere's) & ENT sent her to [facility] 7/11- we don't have their records but pt says nothing was found...  today c/o incr chest congestion, cough, yellow sput, sl hoarse/ ST, etc... we discussed Rx w/ Augmentin, Mucinex, etc...   Current Problems:   MENIERE'S DISEASE  (ICD-3 - eval by ENT, DrBates- Rx w/ diuretic, low salt, Xanax vs Valium, & Antivert Prn... dizziness recurred & ENT sent her to [facility] 7/11- we don't have notes but pt reports that nothing was found.  ALLERGIC RHINITIS - increased allergy symptoms in the spring... rec> Claritin, Saline, Flonase...  Hx of ASTHMATIC BRONCHITIS, ACUTE  - requires occas antibiotics and Medrol for infectious exas- last Oct09 & resolved w/ Avelox/ Depo/ Medrol... has not been on regular inhalers, but uses XOPENEX MDI (w/ spacer), MUCINEX & TUSSIONEX Prn...  ~  essentially neg CTChest 9/08 after CXR suggested RULnodule.  ~  CXR 10/09 showed cardiomegaly, clear lungs, min scoliosis, NAD.  ~  CXR 9/11 showed cardiomeg, clear lungs, NAD...  Hx of SARCOIDOSIS  - Dx'd 1980's w/ bronch bx... Rx Pred transiently and improved... no active disease x years...  ~  baseline CXRs showed cardiomegaly, clear lungs, min scoliosis, NAD.  Hx of Mild OBSTRUCTIVE SLEEP APNEA - sleep eval DrClance 2006 w/ RDI= 5 only.  HYPERTENSION, BENIGN  - on TOPROL XL 50mg - 1.5 tabs daily, CARDIZEM 240mg /d, LASIX 20mg /d...  BP=140/80 today, and similar at home per pt... prev OV's 130-140's / 80's... prev noted sl HA, chest tightness, sl SOB, & mult somatic complaints... Labs & renal- all WNL.  CORONARY ARTERY DISEASE - now on ASA 81mg /d, IMDUR 120mg /d, Toprol XL, Cardizem, Lasix as above...  ~  Cath in 2000 showed non-obstructive CAD (20-30% lesions in all 3 vessels) w/ rec for risk factor modification.    ~  NuclearStressTest 4/07 showed no ischemia & EF=73%.  ~  Mar10:  she's concerned about BP and chest tightness, requests Cardiac eval DrTurner & we will refer...  ~  4/10 eval by DrTurner w/ Celine Ahr- neg- breast attenuation, no regional wall motion abn, EF= 75%  ~  4/10 Cath by DrTurner w/ 90% small 2nd diag branch of LAD <too sm for PTCA> & luminal irreg up to 20% in RCA, +DD- Med Rx.  ~  Myoview 8/11 by DrTurner was neg- no ischemia, no  infarct.  VENOUS INSUFFICIENCY - she was referred to the [facility] foot clinic by DrWWoods and seen by DrSevier 6/08= ven insuffic Rx w/ TED's, no salt, elevation, etc.  ~  10/10: notes bilat ankle ?lipoma in front of the lat malleoli- asymptomatic but several people have noticed them and she request refer to Ortho (rec DrBednarz when she is ready).  HYPERCHOLESTEROLEMIA  - on LIPITOR 40mg /d & ZETIA 10mg /d + CoQ10 per DrTurner... Diet & exercise stressed to the pt.  ~  FLP here 12/07 on Lip10 showed TChol 156, TG 94, HDL 51, LDL 86  ~  FLP 1/09 on Lip10 showed TChol 157, TG 122, HDL 49, LDL 84  ~  FLP at [facility] study 5/09  on Lip10 showed TChol 176, TG 116, HDL Jocelyn, LDL 98  ~  FLP 3/10 on Lip10 showed TChol 170, TG 94, HDL 59, LDL 93  ~  FLP 6/10 by DrTurner on Lip20 showed TChol 134, TG 82, HDL Jocelyn, LDL 73  ~  FLP 4/11 here on Lip20+Zetia? showed TChol 145, TG 74, HDL 68, LDL 63  ~  9/11:  she reports that DrTurner incr Lipitor to 40mg , plus the Zetia 10mg , and she does her FLPs.  DIABETES MELLITUS  - on Metformin 500mg Bid + JANUVIA 100mg /d per DrEllison, but she has stopped the Januvia due to side effects & she wonders about Tajenta mentioned by her daughter who works for [employer]... NOTE: Actos pre caused edema.  ~  labs 9/08 showed FBS=119, HgA1c=6.6  ~  labs 5/09 at [facility] study showed BS= 108, HgA1c= 6.5  ~  labs 3/10 showed BS= 113, A1c= 6.6  ~  labs 6/10 by DrTurner showed BS= 92; and 3/11 BS= 127  ~  labs 4/11 showed BS= 124, A1c= 7.6.[name]. rec> incr Metform to Bid.  ~  6/11:  pt requested Endocrine consult & seen by DrEllison w/ Januvia 100mg /d added.  ~  labs 9/11 showed BS= 98, A1c= 7.3.Marland KitchenMarland Kitchen she stopped Januvia due to side effects & wonders about using Tajenta instead.  GERD SYMPTOMS - pt placed on PROTONIX 40mg /d w/ improvement in reflux symptoms...  IRRITABLE BOWEL SYNDROME  - colonoscopy 7/03 by [name] was WNL.  RENAL CALCULUS  - sm stone seen in L kidney on CTScan... she had lithotripsy by  [facility]  ~9/09.  FIBROMYALGIA  - prev Rx by [name] in 2006.  VITAMIN D DEFICIENCY - Vit D level was 30 at [facility] study in fall 2009... supplemented w/ OTC Vit D 1000 u daily.  ANXIETY - on ALPRAZOLAM 0.5mg  Prn...  ~  3/10: w/ her ?Meniere's suggest 1/2 tab Bid days, and 1 tab Qhs.   Preventive Screening-Counseling & Management  Alcohol-Tobacco     Smoking Status: never  Allergies: 1)  ! Actos (Pioglitazone Hcl) 2)  Codeine 3)  Morphine  Comments:  Nurse/Medical Assistant: The patient's medications and allergies were reviewed with the patient and were updated in the Medication and Allergy Lists.  Past History:  Past Medical History: MENIERE'S DISEASE (ICD-386.00) ALLERGIC RHINITIS (ICD-477.9) ASTHMATIC BRONCHITIS, ACUTE (ICD-466.0) Hx of SARCOIDOSIS (ICD-135) Hx of OBSTRUCTIVE SLEEP APNEA (ICD-327.23) HYPERTENSION, BENIGN (ICD-401.1) CORONARY ARTERY DISEASE (ICD-414.00) VENOUS INSUFFICIENCY (ICD-459.81) HYPERCHOLESTEROLEMIA (ICD-272.0) DIABETES MELLITUS (ICD-250.00) IRRITABLE BOWEL SYNDROME (ICD-564.1) RENAL CALCULUS (ICD-592.0) FIBROMYALGIA (ICD-729.1) VITAMIN D DEFICIENCY (ICD-268.9) DIZZINESS (ICD-780.4) ANXIETY (ICD-300.00)  Family History: Reviewed history from 02/13/2010 and no changes required. pt is adopted  Social History: Reviewed history from 05/08/2010 and no changes required. single 1 child never smoked exercises 2-3 times weekly caffeine use  1 daily--tea or [employer]eps[employer] no alcohol use works at [employer]  Review of Systems      See HPI       The patient complains of weight gain, dyspnea on exertion, and peripheral edema.  The patient denies anorexia, fever, weight loss, vision loss, decreased hearing, hoarseness, chest pain, syncope, prolonged cough, headaches, hemoptysis, abdominal pain, melena, hematochezia, severe indigestion/heartburn, hematuria, incontinence, genital sores, suspicious skin lesions, transient blindness, difficulty walking,  depression, unusual weight change, abnormal bleeding, enlarged lymph nodes, and angioedema.    Vital Signs:  Patient profile:   55 year old female Height:      62 inches Weight:      199 pounds O2 Sat:  97 % on Room air Temp:     97.1 degrees F oral Pulse rate:   76 / minute BP sitting:   142 / 80  (right arm) Cuff size:   regular  Vitals Entered By: Randell Loop CMA (August 14, 2010 9:11 AM)  O2 Sat at Rest %:  97 O2 Flow:  Room air CC: 6 month ROV & review of mult medical problems... Is Patient Diabetic? Yes Pain Assessment Patient in pain? no      Comments meds updated today with pt   Physical Exam  Additional Exam:  WD, Overweight, 55 y/o [name] in NAD... GENERAL:  Alert & oriented; pleasant & cooperative. HEENT:  [place]/AT, EOM-wnl, PERRLA, EACs-clear, TMs-wnl, NOSE-clear, THROAT-clear & wnl. NECK:  Supple w/ fairROM; no JVD; normal carotid impulses w/o bruits; no thyromegaly or nodules palpated; no lymphadenopathy. CHEST:  Clear to P & A; without wheezes/ rales/ or rhonchi heard... HEART:  Regular Rhythm; without murmurs/ rubs/ or gallops. ABDOMEN:  Soft & nontender; normal bowel sounds; no organomegaly or masses detected. EXT: without deformities, mild arthritic changes; no varicose veins but +venous insuffic & tr edema. NEURO:  CN's intact;  no focal neuro deficits... DERM:  No lesions noted; no rash etc...    CXR  Procedure date:  08/14/2010  Findings:      CHEST - 2 VIEW Comparison: 09/10/2008   Findings: The heart remains enlarged.  The lungs are clear bilaterally without evidence of pulmonary edema, pleural effusions or confluent airspace opacities.  No adenopathy seen by plain film criteria.  The upper abdomen osseous structures are unchanged.   IMPRESSION: Cardiomegaly without edema.  No adenopathy by plain film.   Read By:  Henrene Pastor,  M.D.   MISC. Report  Procedure date:  08/14/2010  Findings:      BMP (METABOL)   Sodium                     138 mEq/L                   135-145   Potassium                 4.0 mEq/L                   3.5-5.1   Chloride                  103 mEq/L                   96-112   Carbon Dioxide            27 mEq/L                    19-32   Glucose                   98 mg/dL                    81-19   BUN                       14 mg/dL                    1-47   Creatinine                0.7 mg/dL  0.4-1.2   Calcium                   9.2 mg/dL                   1.6-10.9   GFR                       113.27 mL/min               >60  CBC Platelet w/Diff (CBCD)   White Cell Count          7.3 K/uL                    4.5-10.5   Red Cell Count            4.01 Mil/uL                 3.87-5.11   Hemoglobin           [L]  11.6 g/dL                   60.4-54.0   Hematocrit           [L]  35.0 %                      36.0-46.0   MCV                       87.2 fl                     78.0-100.0   Platelet Count            271.0 K/uL                  150.0-400.0   Neutrophil %              66.6 %                      43.0-77.0   Lymphocyte %              21.6 %                      12.0-46.0   Monocyte %                9.5 %                       3.0-12.0   Eosinophils%              1.7 %                       0.0-5.0   Basophils %               0.6 %                       0.0-3.0  Hemoglobin A1C (A1C)   Hemoglobin A1C       [H]  7.3 %                       4.6-6.5    Impression & Recommendations:  Problem # 1:  ACUTE BRONCHITIS (ICD-466.0) We discussed Rx w/ Augmentin & she wants cough syrup...  Her updated medication list for  this problem includes:    Xopenex Hfa 45 Mcg/act Aero (Levalbuterol tartrate) ..... Inhale 1-2 sprays every 6 h as needed for wheezing...    Augmentin 875-125 Mg Tabs (Amoxicillin-pot clavulanate) .[name]... Take 1 tab by mouth two times a day...    Promethazine-codeine 6.25-10 Mg/74ml Syrp (Promethazine-codeine) .[name]... Take 1 tsp by mouth every 6 h as needed for  cough  Orders: T-2 View CXR (71020TC) TLB-BMP (Basic Metabolic Panel-BMET) (80048-METABOL) TLB-CBC Platelet - w/Differential (85025-CBCD) TLB-A1C / Hgb A1C (Glycohemoglobin) (83036-A1C)  Problem # 2:  MENIERE'S DISEASE (ICD-386.00) Followed by DrBates for ENT w/ referral to [facility] for 2nd opinion, and she reports [facility] eval was neg- nothing found...  Problem # 3:  HYPERTENSION, BENIGN (ICD-401.1) Stable on meds from Cards- DrTTurner... continue same. Her updated medication list for this problem includes:    Metoprolol Succinate 50 Mg Xr24h-tab (Metoprolol succinate) .[name]... Take 1 1/2  tab by mouth once daily...    Cardizem Cd 240 Mg Xr24h-cap (Diltiazem hcl coated beads) .[name]... Take 1 tablet by mouth once a day    Lasix 20 Mg Tabs (Furosemide) .[name]... Take 1 tab by mouth once daily...  Problem # 4:  CORONARY ARTERY DISEASE (ICD-414.00) Stable w/ another neg Myoview recently... continue meds per Cards. Her updated medication list for this problem includes:    Adult Aspirin Low Strength 81 Mg Tbdp (Aspirin) .[name]... 1 tab daily...    Isosorbide Mononitrate Cr 120 Mg Xr24h-tab (Isosorbide mononitrate) .[name]... Take 1 tablet by mouth once a day    Metoprolol Succinate 50 Mg Xr24h-tab (Metoprolol succinate) .[name]... Take 1 1/2  tab by mouth once daily...    Cardizem Cd 240 Mg Xr24h-cap (Diltiazem hcl coated beads) .[name]... Take 1 tablet by mouth once a day    Lasix 20 Mg Tabs (Furosemide) .[name]... Take 1 tab by mouth once daily...  Problem # 5:  HYPERCHOLESTEROLEMIA (ICD-272.0) FLPs followed by Cards & she is on Lip40, Zetia10, etc under her direction... Her updated medication list for this problem includes:    Lipitor 40 Mg Tabs (Atorvastatin calcium) .[name]... Take 1 tablet by mouth once a day    Zetia 10 Mg Tabs (Ezetimibe) .[name]... Take 1 tab by mouth once daily...  Problem # 6:  DIABETES MELLITUS (ICD-250.00) Now followed by Drellison>  Glyptin added but she states intol & wants to change to Tajenta... she will  discuss w/ Endocrine during f/u visit soon... The following medications were removed from the medication list:    Januvia 100 Mg Tabs (Sitagliptin phosphate) .[name]... 1 once daily Her updated medication list for this problem includes:    Adult Aspirin Low Strength 81 Mg Tbdp (Aspirin) .[name]... 1 tab daily...    Metformin Hcl 500 Mg Xr24h-tab (Metformin hcl) .[name]... Pill two times a day  Problem # 7:  IRRITABLE BOWEL SYNDROME (ICD-564.1) GI is stable>  continue current RX.  Problem # 8:  OTHER MEDICAL PROBLEMS AS NOTED>>>  Continue same Rx...  Complete Medication List: 1)  Xopenex Hfa 45 Mcg/act Aero (Levalbuterol tartrate) .... Inhale 1-2 sprays every 6 h as needed for wheezing... 2)  Adult Aspirin Low Strength 81 Mg Tbdp (Aspirin) .[name].. 1 tab daily.Marland KitchenMarland Kitchen 3)  Isosorbide Mononitrate Cr 120 Mg Xr24h-tab (Isosorbide mononitrate) .... Take 1 tablet by mouth once a day 4)  Metoprolol Succinate 50 Mg Xr24h-tab (Metoprolol succinate) .... Take 1 1/2  tab by mouth once daily.Marland KitchenMarland Kitchen 5)  Cardizem Cd 240 Mg Xr24h-cap (Diltiazem hcl coated beads) .... Take 1 tablet by mouth once a  day 6)  Lasix 20 Mg Tabs (Furosemide) .... Take 1 tab by mouth once daily.Marland KitchenMarland Kitchen 7)  Lipitor 40 Mg Tabs (Atorvastatin calcium) .... Take 1 tablet by mouth once a day 8)  Zetia 10 Mg Tabs (Ezetimibe) .... Take 1 tab by mouth once daily.Marland KitchenMarland Kitchen 9)  Coq10 100 Mg Caps (Coenzyme q10) .... Take 2 capsule by mouth once daily 10)  Metformin Hcl 500 Mg Xr24h-tab (Metformin hcl) .... Pill two times a day 11)  Protonix 40 Mg Tbec (Pantoprazole sodium) .... Take 1 tab by mouth once daily- 30 min before the first meal of the day... 12)  Alprazolam 0.5 Mg Tabs (Alprazolam) .... Take 1/2 tab by mouth mid morning & mid afternoon, the 1 tab at bedtime... 13)  Antivert 25 Mg Tabs (Meclizine hcl) .... 1/2 to 1 tab by mouth q4h as needed for dizziness... 14)  Augmentin 875-125 Mg Tabs (Amoxicillin-pot clavulanate) .... Take 1 tab by mouth two times a day... 15)   Promethazine-codeine 6.25-10 Mg/47ml Syrp (Promethazine-codeine) .... Take 1 tsp by mouth every 6 h as needed for cough  Patient Instructions: 1)  Today we updated your med list- see below.... 2)  We wrote new perscriptions for AUGMENTIN, a cough syrup, & refilled your XOPENEX inhaler... 3)  Today we did a follow u[p CXR, & your metabolic panel & N5A blood tests.Marland KitchenMarland Kitchen 4)  please call the "phone tree" in a few days for your lab results.Marland KitchenMarland Kitchen 5)  we will send copies to DrTurner for her records... 6)  Keep your follow up appt w/ DrEllison & mention the TRADJENTA to him as an alternative DM medication from [place].Marland KitchenMarland Kitchen 7)  Call for any questions... 8)  Please schedule a follow-up appointment in 6 months. Prescriptions: PROMETHAZINE-CODEINE 6.25-10 MG/5ML SYRP (PROMETHAZINE-CODEINE) take 1 tsp by mouth every 6 H as needed for cough  #6 oz x 2   Entered and Authorized by:   Michele Mcalpine MD   Signed by:   Michele Mcalpine MD on 08/14/2010   Method used:   Print then Give to Patient   RxID:   [id number] AUGMENTIN 875-125 MG TABS (AMOXICILLIN-POT CLAVULANATE) take 1 tab by mouth two times a day...  #14 x 1   Entered and Authorized by:   Michele Mcalpine MD   Signed by:   Michele Mcalpine MD on 08/14/2010   Method used:   Print then Give to Patient   RxID:   [id number] HFA 45 MCG/ACT AERO (LEVALBUTEROL TARTRATE) inhale 1-2 sprays every 6 H as needed for wheezing...  #1 x 12   Entered and Authorized by:   Michele Mcalpine MD   Signed by:   Michele Mcalpine MD on 08/14/2010   Method used:   Print then Give to Patient   RxID:   [id number]

## 2010-12-17 NOTE — Miscellaneous (Signed)
Summary: [place]  [place]   Imported By: Lester Whitewood 06/17/2010 07:31:13  _____________________________________________________________________  External Attachment:    Type:   Image     Comment:   External Document

## 2010-12-17 NOTE — Assessment & Plan Note (Signed)
Summary: FU /BWS #   Vital Signs:  Patient profile:   55 year old female Height:      62 inches (157.48 cm) Weight:      195 pounds (88.64 kg) BMI:     35.79 O2 Sat:      94 % on Room air Temp:     98.8 degrees F (37.11 degrees C) oral Pulse rate:   71 / minute Pulse rhythm:   regular BP sitting:   108 / 68  (left arm) Cuff size:   large  Vitals Entered By: Brenton Grills CMA Duncan Dull) (November 23, 2010 3:16 PM)  O2 Flow:  Room air CC: Follow-up visit/aj Is Patient Diabetic? Yes Comments pt is due for mammogram   Referring Provider:  nadel  CC:  Follow-up visit/aj.  History of Present Illness: pt now takes parlodel two times a day.  she tolerates it well.  no cbg record, but states cbg's are well-controlled.    Current Medications (verified): 1)  Xopenex Hfa 45 Mcg/act Aero (Levalbuterol Tartrate) .... Inhale 1-2 Sprays Every 6 H As Needed For Wheezing... 2)  Adult Aspirin Low Strength 81 Mg  Tbdp (Aspirin) .[name].. 1 Tab Daily.Marland KitchenMarland Kitchen 3)  Isosorbide Mononitrate Cr 120 Mg Xr24h-Tab (Isosorbide Mononitrate) .... Take 1 Tablet By Mouth Once A Day 4)  Metoprolol Succinate 50 Mg Xr24h-Tab (Metoprolol Succinate) .... Take 1 1/2  Tab By Mouth Once Daily.Marland KitchenMarland Kitchen 5)  Cardizem Cd 240 Mg Xr24h-Cap (Diltiazem Hcl Coated Beads) .... Take 1 Tablet By Mouth Once A Day 6)  Lasix 20 Mg Tabs (Furosemide) .... Take 1 Tab By Mouth Once Daily.Marland KitchenMarland Kitchen 7)  Lipitor 40 Mg Tabs (Atorvastatin Calcium) .... Take 1 Tablet By Mouth Once A Day 8)  Zetia 10 Mg Tabs (Ezetimibe) .... Take 1 Tab By Mouth Once Daily.Marland KitchenMarland Kitchen 9)  Coq10 100 Mg Caps (Coenzyme Q10) .... Take 2 Capsule By Mouth Once Daily 10)  Metformin Hcl 500 Mg Xr24h-Tab (Metformin Hcl) .... Pill Two Times A Day 11)  Protonix 40 Mg Tbec (Pantoprazole Sodium) .... Take 1 Tab By Mouth Once Daily- 30 Min Before The First Meal of The Day... 12)  Alprazolam 0.5 Mg  Tabs (Alprazolam) .... Take 1/2 Tab By Mouth Mid Morning & Mid Afternoon, The 1 Tab At Bedtime... 13)  Antivert  25 Mg  Tabs (Meclizine Hcl) .... 1/2 To 1 Tab By Mouth Q4h As Needed For Dizziness... 14)  Promethazine-Codeine 6.25-10 Mg/21ml Syrp (Promethazine-Codeine) .... Take 1 Tsp By Mouth Every 6 H As Needed For Cough 15)  Bromocriptine Mesylate 2.5 Mg Tabs (Bromocriptine Mesylate) .[name].. 1 Tab At Bedtime 16)  Augmentin 875-125 Mg Tabs (Amoxicillin-Pot Clavulanate) .... Take 1 Tablet By Mouth Two Times A Day 17)  Mucinex 600 Mg Xr12h-Tab (Guaifenesin) .... 2 Tabs By Mouth Two Times A Day 18)  Saline Nasal Spray 0.65 % Soln (Saline) .... Per Bittle  Allergies (verified): 1)  ! Actos (Pioglitazone Hcl) 2)  ! Januvia (Sitagliptin Phosphate) 3)  Codeine 4)  Morphine  Past History:  Past Medical History: Last updated: 08/14/2010 MENIERE'S DISEASE (ICD-386.00) ALLERGIC RHINITIS (ICD-477.9) ASTHMATIC BRONCHITIS, ACUTE (ICD-466.0) Hx of SARCOIDOSIS (ICD-135) Hx of OBSTRUCTIVE SLEEP APNEA (ICD-327.23) HYPERTENSION, BENIGN (ICD-401.1) CORONARY ARTERY DISEASE (ICD-414.00) VENOUS INSUFFICIENCY (ICD-459.81) HYPERCHOLESTEROLEMIA (ICD-272.0) DIABETES MELLITUS (ICD-250.00) IRRITABLE BOWEL SYNDROME (ICD-564.1) RENAL CALCULUS (ICD-592.0) FIBROMYALGIA (ICD-729.1) VITAMIN D DEFICIENCY (ICD-268.9) DIZZINESS (ICD-780.4) ANXIETY (ICD-300.00)  Review of Systems  The patient denies syncope.         denies n/v  Physical Exam  General:  normal appearance.   Pulses:  dorsalis pedis intact bilat.   Extremities:  trace right pedal edema and trace left pedal edema. no deformity.  no ulcer on the feet.  feet are of normal color and temp.     Neurologic:  sensation is intact to touch on the feet Additional Exam:   Hemoglobin A1C       [H]  7.2 %      Impression & Recommendations:  Problem # 1:  DIABETES MELLITUS (ICD-250.00) needs increased rx  Medications Added to Medication List This Visit: 1)  Metformin Hcl 500 Mg Xr24h-tab (Metformin hcl) .[name].. 1 pill two times a day  Other Orders: TLB-A1C / Hgb  A1C (Glycohemoglobin) (83036-A1C) Est. Patient Level III (04540)  Patient Instructions: 1)  blood tests are being ordered for you today.  please call [phone] to hear your test results. 2)  if your a1c is high, options are to increase bromocriptine, add onglyza, ot change zetia to welchol. 3)  Please schedule a follow-up appointment in 3 months, with a1c prior 250.00 4)  i left message on phone tree.  i advised addtion of onglyza)   Orders Added: 1)  TLB-A1C / Hgb A1C (Glycohemoglobin) [83036-A1C] 2)  Est. Patient Level III [78295]   Immunization History:  Influenza Immunization History:    Influenza:  historical (08/15/2010)   Immunization History:  Influenza Immunization History:    Influenza:  Historical (08/15/2010)   Preventive Care Screening  Pap Smear:    Date:  11/15/2008    Results:  normal   Mammogram:    Date:  11/15/2008    Results:  normal

## 2010-12-17 NOTE — Progress Notes (Signed)
Summary: sinus infection  Phone Note Call from Patient Call back at Home Phone [phone]   Caller: Patient Call For: nadel Reason for Call: Talk to Nurse Summary of Call: Patient calling saying she has a sinus infection.  She had cough with green sputum and nasal congestion that is green as well.  Patient would like something called in. [facility] [phone] Initial call taken by: Lehman Prom,  December 07, 2010 8:42 AM  Follow-up for Phone Call        spoke with pt and she is c/o head congestion , sore throat, and nasal congestion with green phlegm x 3 days.pt has been using mucinex. please advise.Carron Curie CMA  December 07, 2010 10:45 AM   Additional Follow-up for Phone Call Additional follow up Details #1::        per SN----ok for pt to have augmentin 875mg   #20   1 by mouth two times a day .  thanks Randell Loop CMA  December 07, 2010 3:11 PM   called and spoke with pt and informed her of sn recs. pt aware augmentin was sent to pharmacy. pt verbalized understanding. Carver Fila  December 07, 2010 3:17 PM     New/Updated Medications: AUGMENTIN 875-125 MG TABS (AMOXICILLIN-POT CLAVULANATE) 1 by mouth two times a day Prescriptions: AUGMENTIN 875-125 MG TABS (AMOXICILLIN-POT CLAVULANATE) 1 by mouth two times a day  #20 x 0   Entered by:   Carver Fila   Authorized by:   Michele Mcalpine MD   Signed by:   Carver Fila on 12/07/2010   Method used:   Electronically to        [facility]. [phone]* (retail)       [address]       [place], [place]  [address]       Ph: [phone]       Fax: [phone]   RxID:   [id number]

## 2010-12-17 NOTE — Progress Notes (Signed)
Summary: talk to nurse  Phone Note Call from Patient Call back at Home Phone [phone]   Caller: Patient Call For: Ceri Mayer Summary of Call: States metformin hcl 500mg  not working, wants to try something else, pls advise.//[facility] Initial call taken by: Darletta Moll,  April 20, 2010 11:56 AM  Follow-up for Phone Call        Spoke with pt.  Pt states she is taking Metformin 500mg  two times a day but BS are still high in the am-148 or higher.  Does not have an appt with SN until Sept-would like his recs on this until then.  Would also like to know his thoughts on new BS med Tradjenta and if he thinks it would work for her.  Will forward message to SN-pls advise.  thanks! Follow-up by: Gweneth Dimitri RN,  April 20, 2010 12:04 PM  Additional Follow-up for Phone Call Additional follow up Details #1::        per SN---we dont stop the metformin--only add to it----rec adding glimeperide 1mg   #30  1 by mouth every morning and cont the metformin two times a day   and keep her rov with SN for follow up.  thanks Randell Loop CMA  April 20, 2010 12:54 PM   pt advised and rx sent. Carron Curie CMA  April 20, 2010 2:06 PM     New/Updated Medications: GLIMEPIRIDE 1 MG TABS (GLIMEPIRIDE) Take 1 tablet by mouth once a day Prescriptions: GLIMEPIRIDE 1 MG TABS (GLIMEPIRIDE) Take 1 tablet by mouth once a day  #30 x 0   Entered by:   Carron Curie CMA   Authorized by:   Michele Mcalpine MD   Signed by:   Carron Curie CMA on 04/20/2010   Method used:   Electronically to        [facility]. [phone]* (retail)       [address]       [place], [place]  [address]       Ph: [phone]       Fax: [phone]   RxID:   [id number]

## 2010-12-17 NOTE — Progress Notes (Signed)
Summary: prescription  Phone Note From Pharmacy   Caller: [facility]. #[id number]* Call For: Jocelyn [name]  Request: Speak with Nurse Summary of Call: Pharmacy says pt would like to change her metformin hcl 500mg  to metformin er because she does better on it. Initial call taken by: Darletta Moll,  February 16, 2010 10:12 AM  Follow-up for Phone Call        pt states she has been on Metformin 500mg  hcl ER, but her new rx she picked up was for metformin hcl. She states she was doing well on metformin ER and wants to know if she can stay on that or was rx changed for a reason. Please advise. Carron Curie CMA  February 16, 2010 11:11 AM   Additional Follow-up for Phone Call Additional follow up Details #1::        per SN---needed to increase the metformin dose to two times a day based on the a1c results of 7.6  this is high---but if she feels like she does better on the metformin once daily then we can leave it like that.  thanks Randell Loop CMA  February 16, 2010 2:20 PM   pt advised why med was changed, she is ok to stay on metformin twice daily. Carron Curie CMA  February 16, 2010 2:46 PM

## 2010-12-17 NOTE — Letter (Signed)
Summary: [facility]   Imported By: Lennie Odor 03/10/2010 12:04:57  _____________________________________________________________________  External Attachment:    Type:   Image     Comment:   External Document

## 2010-12-17 NOTE — Progress Notes (Signed)
Summary: talk to nurse  Phone Note Call from Patient Call back at Home Phone [phone]   Caller: Patient Call For: nadel Summary of Call: States glimepiride 1mg  is taking her blood sugars down too low, pls advise. Initial call taken by: Darletta Moll,  April 23, 2010 11:20 AM  Follow-up for Phone Call        called spoke with patient, she states that she began the glimiperide Tues w/ breakfast and metformin, then took it Wed at lunch with meal and metformin.  approx 4 hours later, pt was tired so she checked her BS = 80.  pt worried this med is too strong for her and also states that the metformin is still causing nausea/diarrhea.   please advise, thanks! Follow-up by: Boone Master MA,  April 23, 2010 12:37 PM  Additional Follow-up for Phone Call Additional follow up Details #1::        per SN: looks like it's time to send her to a DM specialist.  appt w/ Dr. Everardo All or her choice of endocrinologist in [place]. Boone Master CNA/MA  April 23, 2010 4:57 PM   pt ok to be referred to Dr. Everardo All, but she wants to know what to do in the meantime with her medications. Shoudl she cont as they are? Please advsie. Carron Curie CMA  April 23, 2010 5:08 PM  Appt scheduled with Dr. Everardo All on 05/08/10 at 2:00. Pt is aware of appt.Rhonda Cobb  April 24, 2010 9:12 AM     Additional Follow-up for Phone Call Additional follow up Details #2::    per SN: if glimiperide is "too strong" for her then stop for now.  continue with the metformin til appt w/ Everardo All. Boone Master CNA/MA  April 24, 2010 9:44 AM   Pt advised. she staets she might try half a tab of glimeperide to see how it does and if it is still too strong them she will stop untils he sees Dr. Everardo All. I will forward message back to SN just to make him aware of what pt said.  Carron Curie CMA  April 24, 2010 10:06 AM  SN aware and okay with this. Boone Master CNA/MA  April 24, 2010 10:29 AM

## 2010-12-17 NOTE — Letter (Signed)
Summary: [facility]  [facility]   Imported By: Sherian Rein 06/17/2010 07:47:10  _____________________________________________________________________  External Attachment:    Type:   Image     Comment:   External Document

## 2010-12-17 NOTE — Assessment & Plan Note (Signed)
Summary: rov 4 months w/ fast labs///kp   CC:  4 month ROV & review of mult medical problems... .  History of Present Illness: 56 y/o BF here for a follow up visit... she has multiple medical problems as noted below...     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 she suddenly got dizzy, felt like she was going to pass out (never lost consciousness), pulled car over but was still in the road partially blocking traffic, got nauseated, sweaty, no vomiting, no CP or palpit... co-workers came to her aide and got her car to work, checked her sugar (130), she felt exhausted, rested for awhile and the whole thing was over in about 2H... she rested and woke w/ a headache stating that her head "felt off" & sl dizzy... eval included:   Labs-  normal;  CTBrain-  negative;  Neurology eval by DrWillis- vertigo episode w/ HA;  MRI/ MRA-  we don't have these results from their office (MRI from 2003 showed sm vessel dis & otherw neg);  Brainstem Auditory Evoked Potentials-  WNL...  Rx= ASA daily.  ~  Oct09:  she has participated in a [facility] heart study w/ EKG= WNL, and labs- all look good w/ norm FLP, sl low VitD @ 30, XRays w/ 5mm left UPJ stone- s/p lithotripsy by DrNesi...   ~  Feb-Mar10:  further dizziness eval from ENT, DrBates= ?Meniere's dis... he rec treatment w/ low sodium diet, Dyazide, & Valium... sl better, she says... but BP's have been elevated in the 150/100 range, feeling chest tightness, and anxious... she would like eval by Cards & several co-workers see  DrTurner.  ~  Jun10:  she had thorough Cards eval by DrTurner w/ Myoview (neg- breast attenuation, no regional wall motion abn, EF= 75%) & Cath (90% small 2nd diag branch of LAD <too sm for PTCA> & luminal irreg up to 20% in RCA, +DD)> Med Rx w/ incr ToprolXL, +Cardizem, +Imdur, incr Lipitor, & ch Dyazide to Lasix20... DrTurner is planning f/u FLP...  ~  Oct10:  she's  disappointed that she hasn't lost weight after diet [place] [facility] [place] [facility] [place] [facility]... she is sure that her cardiac meds are causing weight gain & she has discussed this w/ DrTurner who rec that she continue the same Rx... given encouragement & told to keep it up... she notes 2 new problems: 1) heartburn/ reflux symptoms- we will Rx w/ PROTONIX;  2) she notes swelling around the lateral malleoli bilat= ?lipomas (they are non-tender, no decr ROM, etc; but she would like ortho eval- refer to [facility])...   ~  February 13, 2010:  she was seen at [facility] 3/27 w/ Vertigo & given shot for nausea  & Rx for Zofran ODT 4mg  Prn... she has had prev thorough evals by Neuro & ENT for dizziness> wonders if Vestib Therapy would be helpful & we will refer... she had recent f/u DrTurner w/ labs- ?FLP & she added Zetia but pt is concerned about $$... BP remains controlled, but she is disappointed that she has gained 6# to 209# today- remains on Metformin 500/d & we will f/u A1c...    Current Problems:   MENIERE'S DISEASE (ICD-3 - eval by ENT, DrBates- Rx w/ diuretic, low salt, Xanax vs Valium, & Antivert Prn.  ALLERGIC RHINITIS - increased allergy symptoms in the spring... rec> Claritin, Saline, Flonase...  Hx of ASTHMATIC BRONCHITIS, ACUTE  -  requires occas antibiotics and Medrol for infectious exas- last Oct09 & resolved w/ Avelox/ Depo/ Medrol... has not been on regular inhalers, but uses XOPENEX MDI (w/ spacer), MUCINEX & TUSSIONEX Prn...  ~  essentially neg CTChest 9/08 after CXR suggested RULnodule.  ~  last CXR 10/09 showed cardiomegaly, clear lungs, min scoliosis, NAD.  Hx of SARCOIDOSIS  - Dx'd 1980's w/ bronch bx... Rx Pred transiently and improved... no active disease x years...  ~  last CXR 10/09 showed cardiomegaly, clear lungs, min scoliosis, NAD.  Hx of Mild OBSTRUCTIVE SLEEP APNEA - sleep eval DrClance 2006 w/ RDI= 5 only.  HYPERTENSION, BENIGN  - on TOPROL XL 50mg - 1.5 tabs daily,  CARDIZEM 240mg /d, LASIX 20mg /d...  BP=110/60 today, and similar at home per pt... prev OV's 130-140's / 80's... prev noted sl HA, chest tightness, sl SOB, mult somatic complaints... Labs & renal- all WNL.  CORONARY ARTERY DISEASE - now on ASA 81mg /d, IMDUR 30mg /d, Toprol XL, Cardizem, Lasix as above...  ~  Cath in 2000 showed non-obstructive CAD (20-30% lesions in all 3 vessels) w/ rec for risk factor modification.    ~  NuclearStressTest 4/07 showed no ischemia & EF=73%.  ~  Mar10:  she's concerned about BP and chest tightness, requests Cardiac eval DrTurner & we will refer...  ~  4/10 eval by DrTurner w/ Celine Ahr- neg- breast attenuation, no regional wall motion abn, EF= 75%  ~  4/10 Cath by DrTurner w/ 90% small 2nd diag branch of LAD <too sm for PTCA> & luminal irreg up to 20% in RCA, +DD- Med Rx.  VENOUS INSUFFICIENCY - she was referred to the [facility] foot clinic by DrWWoods and seen by DrSevier 6/08= ven insuffic Rx w/ TED's, no salt, elevation, etc.  ~  10/10: notes bilat ankle ?lipoma in front of the lat malleoli- asymptomatic but several people have noticed them and she request refer to Ortho.  HYPERCHOLESTEROLEMIA  - on LIPITOR 20mg /d per DrTurner, + diet- but weight continues to climb despite her best efforts...  ~  FLP here 12/07 on Lip10 showed TChol 156, TG 94, HDL 51, LDL 86  ~  FLP 1/09 on Lip10 showed TChol 157, TG 122, HDL 49, LDL 84  ~  FLP at [facility] study 5/09 on Lip10 showed TChol 176, TG 116, HDL 55, LDL 98  ~  FLP 3/10 on Lip10 showed TChol 170, TG 94, HDL 59, LDL 93  ~  FLP 6/10 by DrTurner on Lip20 showed TChol 134, TG 82, HDL 55, LDL 73  ~  FLP 4/11 here on Lip20+Zetia? showed TChol 145, TG 74, HDL 68, LDL 63  DIABETES MELLITUS  - on diet + Metformin 500mg /d... weight up to 209#... BS at home have stabilized  ~120 range.  ~  labs 9/08 showed FBS=119, HgA1c=6.6  ~  labs 5/09 at [facility] study showed BS= 108, HgA1c= 6.5  ~  labs 3/10 showed BS= 113, A1c= 6.6  ~  labs 6/10 by  DrTurner showed BS= 92; and 3/11 BS= 127  ~  labs 4/11 showed BS= 124, A1c= 7.6.[name]. rec> incr Metform to Bid.  IRRITABLE BOWEL SYNDROME  - colonoscopy 7/03 by [name] was WNL.  RENAL CALCULUS  - sm stone seen in L kidney on CTScan... she had lithotripsy by [facility]  ~9/09.  FIBROMYALGIA  - prev Rx by [name] in 2006.  VITAMIN D DEFICIENCY - Vit D level was 30 at [facility] study in fall 2009... supplemented w/ OTC Vit D 1000 u daily.  ANXIETY - on ALPRAZOLAM 0.5mg  Prn...  ~  3/10: w/ her ?Meniere's suggest 1/2 tab Bid days, and 1 tab Qhs.    Allergies: 1)  ! Actos (Pioglitazone Hcl) 2)  Codeine 3)  Morphine  Comments:  Nurse/Medical Assistant: The patient's medications and allergies were reviewed with the patient and were updated in the Medication and Allergy Lists.  Past History:  Past Medical History:  MENIERE'S DISEASE (ICD-386.00) ALLERGIC RHINITIS (ICD-477.9) ASTHMATIC BRONCHITIS, ACUTE (ICD-466.0) Hx of SARCOIDOSIS (ICD-135) Hx of OBSTRUCTIVE SLEEP APNEA (ICD-327.23) HYPERTENSION, BENIGN (ICD-401.1) CORONARY ARTERY DISEASE (ICD-414.00) VENOUS INSUFFICIENCY (ICD-459.81) HYPERCHOLESTEROLEMIA (ICD-272.0) DIABETES MELLITUS (ICD-250.00) IRRITABLE BOWEL SYNDROME (ICD-564.1) RENAL CALCULUS (ICD-592.0) FIBROMYALGIA (ICD-729.1) VITAMIN D DEFICIENCY (ICD-268.9) DIZZINESS (ICD-780.4) ANXIETY (ICD-300.00)  Family History: pt is adopted  Social History: single 1 child never smoked exercises 2-3 times weekly caffeine use  1 daily--tea or pepsi no alcohol use  Review of Systems      See HPI       The patient complains of weight gain and dyspnea on exertion.  The patient denies anorexia, fever, weight loss, vision loss, decreased hearing, hoarseness, chest pain, syncope, peripheral edema, prolonged cough, headaches, hemoptysis, abdominal pain, melena, hematochezia, severe indigestion/heartburn, hematuria, incontinence, muscle weakness, suspicious skin lesions, transient  blindness, difficulty walking, depression, unusual weight change, abnormal bleeding, enlarged lymph nodes, and angioedema.    Vital Signs:  Patient profile:   55 year old female Height:      62 inches Weight:      209 pounds BMI:     38.36 O2 Sat:      97 % on Room air Temp:     97.3 degrees F oral Pulse rate:   76 / minute BP sitting:   144 / 84  (right arm) Cuff size:   regular  Vitals Entered By: Randell Loop CMA (February 13, 2010 10:39 AM)  O2 Sat at Rest %:  97 O2 Flow:  Room air CC: 4 month ROV & review of mult medical problems...  Is Patient Diabetic? Yes Pain Assessment Patient in pain? no      Comments meds updated today   Physical Exam  Additional Exam:  WD, Overweight, 55 y/o BF in NAD... GENERAL:  Alert & oriented; pleasant & cooperative. HEENT:  [place]/AT, EOM-wnl, PERRLA, EACs-clear, TMs-wnl, NOSE-clear, THROAT-clear & wnl. NECK:  Supple w/ fairROM; no JVD; normal carotid impulses w/o bruits; no thyromegaly or nodules palpated; no lymphadenopathy. CHEST:  Clear to P & A; without wheezes/ rales/ or rhonchi heard... HEART:  Regular Rhythm; without murmurs/ rubs/ or gallops. ABDOMEN:  Soft & nontender; normal bowel sounds; no organomegaly or masses detected. EXT: without deformities, mild arthritic changes; no varicose veins but +venous insuffic & tr edema. NEURO:  CN's intact;  no focal neuro deficits... DERM:  No lesions noted; no rash etc...    MISC. Report  Procedure date:  02/13/2010  Findings:      BMP (METABOL)   Sodium                    136 mEq/L                   135-145   Potassium                 3.6 mEq/L                   3.5-5.1   Chloride  100 mEq/L                   96-112   Carbon Dioxide            28 mEq/L                    19-32   Glucose              [H]  124 mg/dL                   16-10   BUN                       9 mg/dL                     9-60   Creatinine                0.7 mg/dL                   4.5-4.0    Calcium                   9.2 mg/dL                   9.8-11.9   GFR                       111.61 mL/min               >60  Hemoglobin A1C (A1C)   Hemoglobin A1C       [H]  7.6 %                       4.6-6.5   Lipid Panel (LIPID)   Cholesterol               145 mg/dL                   1-478   Triglycerides             74.0 mg/dL                  2.9-562.1   HDL                       30.86 mg/dL                 >57.84   LDL Cholesterol           63 mg/dL                    6-96  CBC Platelet w/Diff (CBCD)   White Cell Count          7.6 K/uL                    4.5-10.5   Red Cell Count            4.40 Mil/uL                 3.87-5.11   Hemoglobin                12.9 g/dL                   29.5-28.4   Hematocrit  38.4 %    MCV                       87.3 fl                     78.0-100.0   Neutrophil %              65.5 %                      43.0-77.0   Lymphocyte %              25.7 %                      12.0-46.0   Monocyte %                7.7 %                       3.0-12.0  TSH (TSH)   FastTSH                   1.13 uIU/mL                 0.35-5.50    Eosinophils%              0.6 %                       0.0-5.0   Basophils %               0.5 %    Impression & Recommendations:  Problem # 1:  MENIERE'S DISEASE (ICD-386.00) She c/o persist dizziness/ vertigo & went to the ER w/ N/V 3/27...  She's had thorough evals by Neuro & ENT in the past... Try Vestib Rehab therapy... Misc. Referral (Misc. Ref)  Problem # 2:  ASTHMATIC BRONCHITIS, ACUTE (ICD-466.0) She's been doing well-  no recent exac etc... Her updated medication list for this problem includes:    Xopenex Hfa 45 Mcg/act Aero (Levalbuterol tartrate) ..... Inhale 1-2 sprays every 6 h as needed for wheezing...  Orders: TLB-BMP (Basic Metabolic Panel-BMET) (80048-METABOL) TLB-A1C / Hgb A1C (Glycohemoglobin) (83036-A1C) TLB-Lipid Panel (80061-LIPID) TLB-CBC Platelet - w/Differential  (85025-CBCD) TLB-TSH (Thyroid Stimulating Hormone) (84443-TSH)  Problem # 3:  HYPERTENSION, BENIGN (ICD-401.1) BP is reasonable-  discussed the beneficial effect of weight reduction on BP control & she would rather work on weight reduction rather than take more meds. Her updated medication list for this problem includes:    Metoprolol Succinate 50 Mg Xr24h-tab (Metoprolol succinate) .[name]... Take 1 1/2  tab by mouth once daily...    Cardizem Cd 240 Mg Xr24h-cap (Diltiazem hcl coated beads) .[name]... Take 1 tablet by mouth once a day    Lasix 20 Mg Tabs (Furosemide) .[name]... Take 1 tab by mouth once daily...  Problem # 4:  CORONARY ARTERY DISEASE (ICD-414.00) Followed by DrTTurner... Her updated medication list for this problem includes:    Adult Aspirin Low Strength 81 Mg Tbdp (Aspirin) .[name]... 1 tab daily...    Imdur 30 Mg Xr24h-tab (Isosorbide mononitrate) .[name]... 1 by mouth once daily    Metoprolol Succinate 50 Mg Xr24h-tab (Metoprolol succinate) .[name]... Take 1 1/2  tab by mouth once daily...    Cardizem Cd 240 Mg Xr24h-cap (Diltiazem hcl coated beads) .[name]... Take 1 tablet by mouth once a day    Lasix 20 Mg  Tabs (Furosemide) .[name]... Take 1 tab by mouth once daily...  Problem # 5:  HYPERCHOLESTEROLEMIA (ICD-272.0) FLP today looks good- continue same meds, copy yo Cards. Her updated medication list for this problem includes:    Lipitor 20 Mg Tabs (Atorvastatin calcium) .[name]... Take 1 tablet by mouth once a day  Problem # 6:  DIABETES MELLITUS (ICD-250.00) Unfortunately A1c is up to 7.6 w/ her weight gain... REC> increase Metformin to 500mg  Bid... Get on diet & get weight down, then we might be able to decr med again... Her updated medication list for this problem includes:    Adult Aspirin Low Strength 81 Mg Tbdp (Aspirin) .[name]... 1 tab daily...    Metformin Hcl 500 Mg Tabs (Metformin hcl) .[name]... Take 1 tablet by mouth two times a day  Problem # 7:  OTHER MEDICAL PROBLEMS AS NOTED>>>  Complete Medication  List: 1)  Xopenex Hfa 45 Mcg/act Aero (Levalbuterol tartrate) .... Inhale 1-2 sprays every 6 h as needed for wheezing... 2)  Adult Aspirin Low Strength 81 Mg Tbdp (Aspirin) .[name].. 1 tab daily.Marland KitchenMarland Kitchen 3)  Imdur 30 Mg Xr24h-tab (Isosorbide mononitrate) .[name].. 1 by mouth once daily 4)  Metoprolol Succinate 50 Mg Xr24h-tab (Metoprolol succinate) .... Take 1 1/2  tab by mouth once daily.Marland KitchenMarland Kitchen 5)  Cardizem Cd 240 Mg Xr24h-cap (Diltiazem hcl coated beads) .... Take 1 tablet by mouth once a day 6)  Lasix 20 Mg Tabs (Furosemide) .... Take 1 tab by mouth once daily.Marland KitchenMarland Kitchen 7)  Lipitor 20 Mg Tabs (Atorvastatin calcium) .... Take 1 tablet by mouth once a day 8)  Metformin Hcl 500 Mg Tabs (Metformin hcl) .... Take 1 tablet by mouth two times a day 9)  Alprazolam 0.5 Mg Tabs (Alprazolam) .... Take 1/2 tab by mouth mid morning & mid afternoon, the 1 tab at bedtime... 10)  Antivert 25 Mg Tabs (Meclizine hcl) .... 1/2 to 1 tab by mouth q4h as needed for dizziness... 11)  Protonix 40 Mg Tbec (Pantoprazole sodium) .... Take 1 tab by mouth once daily- 30 min before the first meal of the day...  Other Orders: Nutrition Referral (Nutrition)  Patient Instructions: 1)  Today we updated your med list- see below.... 2)  Today we repeated your fasting blood work as discussed... please call the "phone tree" in a few days for your lab results.Marland KitchenMarland Kitchen 3)  Darina, let's get on track w/ our diet + exercise program... you MUST lose 15-20 lbs ASAP.Marland KitchenMarland Kitchen 4)  Please schedule a follow-up appointment in 6 months, sooner as needed. Prescriptions: METFORMIN HCL 500 MG  TABS (METFORMIN HCL) Take 1 tablet by mouth two times a day  #60 x prn   Entered and Authorized by:   Michele Mcalpine MD   Signed by:   Michele Mcalpine MD on 02/13/2010   Method used:   Electronically to        [facility]. [phone]* (retail)       [address]       [place], [place]  [address]       Ph: [phone]       Fax: [phone]   RxID:    [id number]

## 2010-12-17 NOTE — Medication Information (Signed)
Summary: Glucose Testing Supplies/[facility]  Glucose Testing Supplies/[facility]   Imported By: Sherian Rein 02/16/2010 11:00:56  _____________________________________________________________________  External Attachment:    Type:   Image     Comment:   External Document

## 2010-12-17 NOTE — Progress Notes (Signed)
Summary: sick--needs abx > augmentin 875mg   Phone Note Call from Patient Call back at [facility] Phone [phone]   Caller: Patient Call For: nadel Summary of Call: Patient calling c/o congestion, coughing up green mucus, head congestion.  [facility] Initial call taken by: Lehman Prom,  October 12, 2010 2:21 PM  Follow-up for Phone Call        called and spoke with pt---she stated that she has been sick since Thanksgiving---cough with green sputum and nasal congestion that is green as well---having some chills--seems to be getting worse.  requesting abx for this.  please advise.   Randell Loop CMA  October 12, 2010 3:45 PM   ALLERGIES---MORPHINE,CODEINE,ACTOS  Additional Follow-up for Phone Call Additional follow up Details #1::        per SN: augmentin 875mg  #20 sig: 1 by mouth two times a day; mucinex 600mg  sig: 2 by mouth two times a day and saline nasal spray.  called spoke with patient, advised of SN's recs as stated above.  pt verbalized her understanding.  Additional Follow-up by: Boone Master CNA/MA,  October 12, 2010 5:28 PM    New/Updated Medications: AUGMENTIN 875-125 MG TABS (AMOXICILLIN-POT CLAVULANATE) Take 1 tablet by mouth two times a day MUCINEX 600 MG XR12H-TAB (GUAIFENESIN) 2 tabs by mouth two times a day SALINE NASAL SPRAY 0.65 % SOLN (SALINE) per bittle Prescriptions: AUGMENTIN 875-125 MG TABS (AMOXICILLIN-POT CLAVULANATE) Take 1 tablet by mouth two times a day  #20 x 0   Entered by:   Boone Master CNA/MA   Authorized by:   Michele Mcalpine MD   Signed by:   Boone Master CNA/MA on 10/12/2010   Method used:   Electronically to        [facility]. [id number]* (retail)       [address]       [place], [place]  [address]       Ph: [phone]       Fax: [phone]   RxID:   [id number]

## 2010-12-24 ENCOUNTER — Telehealth (INDEPENDENT_AMBULATORY_CARE_PROVIDER_SITE_OTHER): Payer: Self-pay | Admitting: *Deleted

## 2010-12-31 NOTE — Progress Notes (Signed)
Summary: PA Approval for Protonix 40mg  through 12-24-2011  Phone Note From Pharmacy   Caller: Fax from [facility] [phone] Call For: Jodelle Green  Summary of Call: PA for Protonix 40mg  recieved from [facility] [address] [phone] Fax [phone]; I called [phone] and got PA approved from today through 12-24-2011.   Case ID # [id number]  Pharmacy is aware. Initial call taken by: Reynaldo Minium CMA,  December 24, 2010 4:59 PM

## 2011-01-03 ENCOUNTER — Encounter: Payer: Self-pay | Admitting: Pulmonary Disease

## 2011-01-21 NOTE — Medication Information (Signed)
Summary: Testing Supplies/[facility]  Testing Supplies/[facility]   Imported By: Lester [place] 01/12/2011 09:53:13  _____________________________________________________________________  External Attachment:    Type:   Image     Comment:   External Document

## 2011-02-08 LAB — POCT I-STAT, CHEM 8
BUN: 10 mg/dL (ref 6–23)
Calcium, Ion: 1.16 mmol/L (ref 1.12–1.32)
Chloride: 105 mEq/L (ref 96–112)
Sodium: 140 mEq/L (ref 135–145)

## 2011-02-08 LAB — POCT URINALYSIS DIP (DEVICE)
Bilirubin Urine: NEGATIVE
pH: 6.5 (ref 5.0–8.0)

## 2011-02-16 ENCOUNTER — Ambulatory Visit: Payer: Self-pay | Admitting: Pulmonary Disease

## 2011-02-18 ENCOUNTER — Encounter: Payer: Self-pay | Admitting: Pulmonary Disease

## 2011-02-22 ENCOUNTER — Other Ambulatory Visit (INDEPENDENT_AMBULATORY_CARE_PROVIDER_SITE_OTHER): Payer: BC Managed Care – PPO

## 2011-02-22 ENCOUNTER — Ambulatory Visit (INDEPENDENT_AMBULATORY_CARE_PROVIDER_SITE_OTHER): Payer: BC Managed Care – PPO | Admitting: Pulmonary Disease

## 2011-02-22 ENCOUNTER — Telehealth: Payer: Self-pay | Admitting: Pulmonary Disease

## 2011-02-22 ENCOUNTER — Encounter: Payer: Self-pay | Admitting: Pulmonary Disease

## 2011-02-22 DIAGNOSIS — Z23 Encounter for immunization: Secondary | ICD-10-CM

## 2011-02-22 DIAGNOSIS — E7800 Pure hypercholesterolemia, unspecified: Secondary | ICD-10-CM

## 2011-02-22 DIAGNOSIS — E119 Type 2 diabetes mellitus without complications: Secondary | ICD-10-CM

## 2011-02-22 DIAGNOSIS — F32A Depression, unspecified: Secondary | ICD-10-CM

## 2011-02-22 DIAGNOSIS — F3289 Other specified depressive episodes: Secondary | ICD-10-CM

## 2011-02-22 DIAGNOSIS — R531 Weakness: Secondary | ICD-10-CM | POA: Insufficient documentation

## 2011-02-22 DIAGNOSIS — I10 Essential (primary) hypertension: Secondary | ICD-10-CM

## 2011-02-22 DIAGNOSIS — I2510 Atherosclerotic heart disease of native coronary artery without angina pectoris: Secondary | ICD-10-CM

## 2011-02-22 DIAGNOSIS — F329 Major depressive disorder, single episode, unspecified: Secondary | ICD-10-CM

## 2011-02-22 DIAGNOSIS — R5383 Other fatigue: Secondary | ICD-10-CM

## 2011-02-22 DIAGNOSIS — F411 Generalized anxiety disorder: Secondary | ICD-10-CM

## 2011-02-22 DIAGNOSIS — R5381 Other malaise: Secondary | ICD-10-CM

## 2011-02-22 DIAGNOSIS — IMO0001 Reserved for inherently not codable concepts without codable children: Secondary | ICD-10-CM

## 2011-02-22 DIAGNOSIS — J209 Acute bronchitis, unspecified: Secondary | ICD-10-CM

## 2011-02-22 LAB — CBC WITH DIFFERENTIAL/PLATELET
Basophils Absolute: 0 10*3/uL (ref 0.0–0.1)
Basophils Relative: 0.5 % (ref 0.0–3.0)
Eosinophils Absolute: 0 10*3/uL (ref 0.0–0.7)
Hemoglobin: 13.1 g/dL (ref 12.0–15.0)
Lymphocytes Relative: 25.8 % (ref 12.0–46.0)
MCHC: 33.8 g/dL (ref 30.0–36.0)
MCV: 88.8 fl (ref 78.0–100.0)
Monocytes Absolute: 0.6 10*3/uL (ref 0.1–1.0)
Neutro Abs: 4 10*3/uL (ref 1.4–7.7)
RDW: 14.8 % — ABNORMAL HIGH (ref 11.5–14.6)

## 2011-02-22 LAB — HEPATIC FUNCTION PANEL
ALT: 25 U/L (ref 0–35)
Alkaline Phosphatase: 87 U/L (ref 39–117)
Bilirubin, Direct: 0.1 mg/dL (ref 0.0–0.3)
Total Bilirubin: 0.8 mg/dL (ref 0.3–1.2)
Total Protein: 7.1 g/dL (ref 6.0–8.3)

## 2011-02-22 LAB — BASIC METABOLIC PANEL
CO2: 27 mEq/L (ref 19–32)
Calcium: 9.4 mg/dL (ref 8.4–10.5)
Chloride: 103 mEq/L (ref 96–112)
Sodium: 139 mEq/L (ref 135–145)

## 2011-02-22 LAB — LIPID PANEL
LDL Cholesterol: 90 mg/dL (ref 0–99)
Total CHOL/HDL Ratio: 3

## 2011-02-22 MED ORDER — SERTRALINE HCL 50 MG PO TABS: ORAL_TABLET | ORAL | Status: DC

## 2011-02-22 NOTE — Progress Notes (Signed)
Subjective:    Patient ID: Jocelyn [name], female    DOB: 02-Sep-1956, 55 y.o.   MRN: [id number]  HPI 55 y/o BF here for a follow up visit... she has multiple medical problems including:  AR & Asthma;  Hx Sarcoid;  Mild OSA;  HBP;  CAD;  Ven Insuffic;  Hyperchol;  DM;  GERD/ IBS;  Hx Kidney stones;  FM;  Vit D defic;  Anxiety...  ~  August 14, 2010:  she had nutrition counselling to [facility] & wt is down to 199# now... she wanted Endocrine consult & saw DrEllison 6/11 w/ rec for adding Januvia 100mg /d... she states that she's intol to the Januvia & wonders about ch to [place] since her daugh works for [employer] (she will discuss w/ DrEllison during up=coming visit)...  noted more chest tightness & had another Myoview from DrTurner 8/11- it too was neg, no ischemia, no infarct; & her IMDUR has been incr to 120mg /d... DrTurner follows her Lipids and also incr her Lipitor to 40mg /d & added Zetia 10mg  & CoQ10- she doesn't want [name] to check her FLP, DrTurner does this... she had further dizziness (Meniere's) & ENT sent her to [facility] 7/11- we don't have their records but pt says nothing was found...  today c/o incr chest congestion, cough, yellow sput, sl hoarse/ ST, etc... we discussed Rx w/ Augmentin, Mucinex, etc...  ~  February 22, 2011:  48mo ROV- she has been seeing DrEllison for DM management & he placed her on Parlodel (Bromocryptine);  Last seen 1/12 w/ A1c= 7.2 and MetforminER 500mg Bid was added;  Today she c/o feeling sad & anxious all the time- weak, not resting well, under a lot of stress; rec to seek counselling thru her local church or let us refer to [facility], & start RX w/ Zoloft 50==>100mg  Qhs;  Due for fasting labs & TDAP today...        Problems List:  MENIERE'S DISEASE (ICD-3 - eval by ENT, DrBates- Rx w/ diuretic, low salt, Xanax vs Valium, & Antivert Prn... dizziness recurred & ENT sent her to [facility] 7/11- we don't have notes but pt reports that nothing was found.  ALLERGIC  RHINITIS - increased allergy symptoms in the spring... rec> Claritin, Saline, Flonase...  Hx of ASTHMATIC BRONCHITIS, ACUTE  - requires occas Antibiotics and Medrol for infectious exac- last Oct09 & resolved w/ Avelox/ Depo/ Medrol... has not been on regular inhalers, but uses XOPENEX MDI (w/ spacer), MUCINEX & TUSSIONEX Prn... ~  essentially neg CTChest 9/08 after CXR suggested RULnodule. ~  CXR 10/09 showed cardiomegaly, clear lungs, min scoliosis, NAD. ~  CXR 9/11 showed cardiomeg, clear lungs, NAD...  Hx of SARCOIDOSIS  - Dx'd 1980's w/ bronch bx... Rx Pred transiently and improved... no active disease x years... ~  baseline CXRs showed cardiomegaly, clear lungs, min scoliosis, NAD.  Hx of Mild OBSTRUCTIVE SLEEP APNEA - sleep eval DrClance 2006 w/ RDI= 5 only.  HYPERTENSION, BENIGN  - on TOPROL XL 50mg - 1.5 tabs daily, CARDIZEM 240mg /d, LASIX 20mg /d... ~  prev OV's 130-140's / 80's... prev noted sl HA, chest tightness, sl SOB, & mult somatic complaints... Labs & renal- all WNL. ~  4/12:  BP=122/76 today, and similar at home per pt... She denies CP, palpit, SOB, edema, etc... Labs & renal- all WNL.  CORONARY ARTERY DISEASE - now on ASA 81mg /d, IMDUR 120mg /d, Toprol XL, Cardizem, Lasix as above... Followed by DrTTurner. ~  Cath in 2000 showed non-obstructive CAD (20-30% lesions in all  3 vessels) w/ rec for risk factor modification.   ~  NuclearStressTest 4/07 showed no ischemia & EF=73%. ~  Mar10:  she's concerned about BP and chest tightness, requests Cardiac eval DrTurner & we will refer... ~  4/10 eval by DrTurner w/ Celine Ahr- neg- breast attenuation, no regional wall motion abn, EF= 75% ~  4/10 Cath by DrTurner w/ 90% small 2nd diag branch of LAD <too sm for PTCA> & luminal irreg up to 20% in RCA, +DD- Med Rx. ~  Myoview 8/11 by DrTurner was neg- no ischemia, no infarct.  VENOUS INSUFFICIENCY - she was referred to the [facility] foot clinic by DrWWoods and seen by DrSevier 6/08= ven insuffic Rx  w/ TED's, no salt, elevation, etc. ~  10/10: notes bilat ankle ?lipoma in front of the lat malleoli- asymptomatic but several people have noticed them and she request refer to Ortho (rec DrBednarz when she is ready).  HYPERCHOLESTEROLEMIA  - on LIPITOR 40mg /d & ZETIA 10mg /d + CoQ10 per DrTurner... Diet & exercise stressed to the pt. ~  FLP here 12/07 on Lip10 showed TChol 156, TG 94, HDL 51, LDL 86 ~  FLP 1/09 on Lip10 showed TChol 157, TG 122, HDL 49, LDL 84 ~  FLP at [facility] study 5/09 on Lip10 showed TChol 176, TG 116, HDL 55, LDL 98 ~  FLP 3/10 on Lip10 showed TChol 170, TG 94, HDL 59, LDL 93 ~  FLP 6/10 by DrTurner on Lip20 showed TChol 134, TG 82, HDL 55, LDL 73 ~  FLP 4/11 here on Lip20+Zetia? showed TChol 145, TG 74, HDL 68, LDL 63 ~  9/11:  she reports that DrTurner incr Lipitor to 40mg , plus the Zetia 10mg , and she does her FLPs. ~  FLP here 4/12 on Lip40+Zetia showed TChol 153, TG 54, HDL 53, LDL 90... Copy sent to DrTurner  DIABETES MELLITUS  - on Metformin 500mg Bid + PARLODEL 2.5mg /d per DrEllison... She is intol to Actos w/ edema, she stopped Januvia due to ?side effect, she wondered about Tajenta since her daugh works for [employer]. ~  labs 9/08 showed FBS=119, HgA1c=6.6 ~  labs 5/09 at [facility] study showed BS= 108, HgA1c= 6.5 ~  labs 3/10 showed BS= 113, A1c= 6.6 ~  labs 6/10 by DrTurner showed BS= 92; and 3/11 BS= 127 ~  labs 4/11 showed BS= 124, A1c= 7.6.[name]. rec> incr Metform to Bid. ~  6/11:  pt requested Endocrine consult & seen by DrEllison w/ Januvia 100mg /d added. ~  labs 9/11 showed BS= 98, A1c= 7.3.Marland KitchenMarland Kitchen she stopped Januvia due to side effects & wonders about using Tajenta instead. ~  DrEllison started [employer] 2.5mg /d... She is now taking this +Metform 500mg Bid... ~  Labs 4/12 showed Bs= 116, A1c= 7.1  GERD SYMPTOMS - pt placed on PROTONIX 40mg /d w/ improvement in reflux symptoms...  IRRITABLE BOWEL SYNDROME  - colonoscopy 7/03 by [name] was WNL.  RENAL CALCULUS  - sm  stone seen in L kidney on CTScan... she had lithotripsy by [facility] ~9/09.  FIBROMYALGIA  - prev Rx by [name] in 2006.  VITAMIN D DEFICIENCY - Vit D level was 30 at [facility] study in fall 2009... supplemented w/ OTC Vit D 1000 u daily.  ANXIETY - on ALPRAZOLAM 0.5mg  Prn... ~  3/10: w/ her ?Meniere's suggest 1/2 tab Bid days, and 1 tab Qhs.  HEALTH MAINTENANCE: ~  GI:  Followed by [name] & colon due 7/13... ~  GYN:  Followed by DrDillard & she gets yearly PAP, Mammogram, hasn't had  baseline BMD yet... ~  Immunizations:  She gets the yearly Flu shots at school;  Had PNEUMOVAX previously;  OK TDAP today 4/12...   No past surgical history on file - other than Lithotripsy for renal stone...   Outpatient Encounter Prescriptions as of 02/22/2011  Medication Sig Dispense Refill  . ALPRAZolam (XANAX) 0.5 MG tablet Take 0.5 mg by mouth. Take 1/2 tab mouth mid morning and mid afternoon, the 1 tab at bedtime       . aspirin 81 MG tablet Take 81 mg by mouth daily.        [name] atorvastatin (LIPITOR) 40 MG tablet Take 40 mg by mouth daily.        . bromocriptine (PARLODEL) 2.5 MG tablet Take 2.5 mg by mouth daily.        . Coenzyme Q10 (CO Q 10) 100 MG CAPS Take 2 capsules by mouth.        . diltiazem (CARDIZEM CD) 240 MG 24 hr capsule Take 240 mg by mouth daily.        [name] ezetimibe (ZETIA) 10 MG tablet Take 10 mg by mouth daily.        . furosemide (LASIX) 20 MG tablet Take 20 mg by mouth daily.        [name] guaiFENesin (MUCINEX) 600 MG 12 hr tablet Take 1,200 mg by mouth 2 (two) times daily.        . isosorbide mononitrate (IMDUR) 120 MG 24 hr tablet Take 120 mg by mouth daily.        [name] levalbuterol (XOPENEX HFA) 45 MCG/ACT inhaler Inhale 1-2 puffs into the lungs every 4 (four) hours as needed.        . meclizine (ANTIVERT) 25 MG tablet Take 25 mg by mouth. 1/2 to 1 tab by mouth every 4 hours as needed for dizziness       . metFORMIN (GLUCOPHAGE) 500 MG tablet Take 500 mg by mouth 2 (two) times daily with  a meal.        . metoprolol (TOPROL-XL) 50 MG 24 hr tablet Take 50 mg by mouth. 1 1/2 once daily       . pantoprazole (PROTONIX) 40 MG tablet Take 40 mg by mouth daily.        . sodium chloride (OCEAN NASAL SPRAY) 0.65 % nasal spray 1 spray by Nasal route as needed.        . promethazine-codeine (PHENERGAN WITH CODEINE) 6.25-10 MG/5ML syrup Take 5 mLs by mouth every 4 (four) hours as needed.          Allergies  Allergen Reactions  . Codeine     REACTION: vomiting  . Morphine     REACTION: vomiting  . Pioglitazone     REACTION: pt states INTOL w/ edema  . Sitagliptin Phosphate     REACTION: nausea    Review of Systems        See HPI - all other systems neg except as noted...      The patient complains of weight gain, dyspnea on exertion, and peripheral edema.  The patient denies anorexia, fever, weight loss, vision loss, decreased hearing, hoarseness, chest pain, syncope, prolonged cough, headaches, hemoptysis, abdominal pain, melena, hematochezia, severe indigestion/heartburn, hematuria, incontinence, genital sores, suspicious skin lesions, transient blindness, difficulty walking, depression, unusual weight change, abnormal bleeding, enlarged lymph nodes, and angioedema.     Objective:   Physical Exam     WD, Overweight, 54 y/o BF in NAD... GENERAL:  Alert &  oriented; pleasant & cooperative. HEENT:  [place]/AT, EOM-wnl, PERRLA, EACs-clear, TMs-wnl, NOSE-clear, THROAT-clear & wnl. NECK:  Supple w/ fairROM; no JVD; normal carotid impulses w/o bruits; no thyromegaly or nodules palpated; no lymphadenopathy. CHEST:  Clear to P & A; without wheezes/ rales/ or rhonchi heard... HEART:  Regular Rhythm; without murmurs/ rubs/ or gallops. ABDOMEN:  Soft & nontender; normal bowel sounds; no organomegaly or masses detected. EXT: without deformities, mild arthritic changes; no varicose veins but +venous insuffic & tr edema. NEURO:  CN's intact;  no focal neuro deficits... DERM:  No lesions noted;  no rash etc...   Assessment & Plan:   RESP>  Hx AR, Asthma, old sarcoid, mild OSA>  All stable, breathing OK, but she notes some wheezing w/ exercise;  On Mucinex & asked to use her Xopenex prior to exercise...  CARDS>  Followed by DrTTurner on meds liste> Hx HBP, CAD, VI, etc;  Doing satis & we reviewed exercise program etc...  CHOL>  Doing satis on the Lip + Zetia;  Needs better diet & get wt down...  DM>  Followed by DrEllison on Metformin + Parlodel;  BS= 116, A1c=7.1;  rec continue meds & f/u w/ Endocrine; low carb diet, get wt down...  GI>  Stable...  FM>  She states doing well...  Anxiety>  She remains on Alprazolam..[name]

## 2011-02-22 NOTE — Telephone Encounter (Signed)
I spoke with patient-she is requesting results for HA1C-Results are in EPIC but not sure that SN has signed/reviewed them at this time. Please advise.

## 2011-02-22 NOTE — Patient Instructions (Signed)
Today we updated your med list in our EPIC system...    We wrote a new prescription for Zoloft (Sertraline) 50mg - start w/ one at bedtime for 62month & incr to 2 at bedtime thereafter...  Today we did your follow up fasting blood work...    Please call the PHONE TREE in a few days for your results...    Dial [id number] & when prompted enter your patient number followed by the # symbol...    Your patient number is:  [id number]#  We also gave you a combination TETANUS vaccine today called the TDAP (this is good for 57yrs)...  You would also benefit from some counselling to help w/ your sadness & anxiety>    You could get this thru your local church affiliation or we could refer your to [facility] office, just let us know...  Call for any questions...  Let's sche a follow up in 6 months, sooner if necessary.Marland KitchenMarland Kitchen

## 2011-02-24 LAB — POCT I-STAT GLUCOSE: Operator id: 194801

## 2011-02-28 ENCOUNTER — Encounter: Payer: Self-pay | Admitting: Pulmonary Disease

## 2011-03-01 ENCOUNTER — Ambulatory Visit (INDEPENDENT_AMBULATORY_CARE_PROVIDER_SITE_OTHER): Payer: BC Managed Care – PPO | Admitting: Endocrinology

## 2011-03-01 ENCOUNTER — Encounter: Payer: Self-pay | Admitting: Endocrinology

## 2011-03-01 VITALS — BP 112/62 | HR 74 | Temp 98.2°F | Ht 62.0 in | Wt 192.4 lb

## 2011-03-01 DIAGNOSIS — E119 Type 2 diabetes mellitus without complications: Secondary | ICD-10-CM

## 2011-03-01 NOTE — Patient Instructions (Signed)
Add onglyza 1/2 of 2.5 mg each morning.  Here are some samples. Please make a follow-up appointment in 3 months

## 2011-03-01 NOTE — Telephone Encounter (Signed)
Phone tree message was done by SN

## 2011-03-01 NOTE — Progress Notes (Signed)
  Subjective:    Patient ID: Lucienne Minks, female    DOB: [birth year], 55 y.o.   MRN: [id number]  HPI pt states she feels well in general, except for fatigue.  She takes meds as rx'ed. Past Medical History  Diagnosis Date  . Meniere's disease, unspecified   . Allergic rhinitis, cause unspecified   . Acute bronchitis   . Sarcoidosis   . Obstructive sleep apnea (adult) (pediatric)   . Essential hypertension, benign   . Coronary atherosclerosis of unspecified type of vessel, native or graft   . Unspecified venous (peripheral) insufficiency   . Pure hypercholesterolemia   . Type II or unspecified type diabetes mellitus without mention of complication, not stated as uncontrolled   . Irritable bowel syndrome   . Calculus of kidney   . Fibromyalgia   . Vitamin D deficiency   . Dizziness   . Anxiety    No past surgical history on file.  reports that she has never smoked. She has never used smokeless tobacco. She reports that she does not drink alcohol or use illicit drugs. family history is not on file. Allergies  Allergen Reactions  . Codeine     REACTION: vomiting  . Morphine     REACTION: vomiting  . Pioglitazone     REACTION: pt states INTOL w/ edema  . Sitagliptin Phosphate     REACTION: nausea     Review of Systems She has lot a few lbs, due to her efforts.    Objective:   Physical Exam GENERAL: no distress.  Obese. Neck: i do not appreciate a nodule in the thyroid or elsewhere in the neck        Assessment & Plan:  Dm, needs increased rx

## 2011-03-20 ENCOUNTER — Other Ambulatory Visit: Payer: Self-pay | Admitting: Pulmonary Disease

## 2011-03-30 NOTE — Cardiovascular Report (Signed)
NAMEKONNI, KESINGER                ACCOUNT NO.:  [id number]   MEDICAL RECORD NO.:  [id number]          PATIENT TYPE:  OIB   LOCATION:  1963                         FACILITY:  [facility]   PHYSICIAN:  Armanda Magic, M.D.     DATE OF BIRTH:  06-17-1956   DATE OF PROCEDURE:  03/07/2009  DATE OF DISCHARGE:  03/07/2009                            CARDIAC CATHETERIZATION   REFERRING PHYSICIAN:  I do not have the referring physician's name.   PRIMARY PHYSICIAN:  Lonzo Cloud. Kriste Basque, MD   PROCEDURE:  Left heart catheterization, coronary angiography, and left  ventriculography.   OPERATOR:  Armanda Magic, MD   INDICATIONS:  Chest pain.   COMPLICATIONS:  None.   IV ACCESS:  Via right femoral artery 4-French sheath.   IV MEDICATIONS:  Versed 2 mg IV and fentanyl 25 mcg IV.   PROCEDURE:  This is a 55 year old female who presents with chest pain  and negative Cardiolite for inducible ischemia, but because of ongoing  chest pain now presents for cardiac catheterization.   The patient was brought to Cardiac Catheterization Laboratory in a  fasting nonsedated state.  Informed consent was obtained.  The patient  was connected to continuous heart rate, pulse oximetry monitoring, and  intermittent blood pressure might.  The right groin was prepped and  draped in a sterile fashion.  Xylocaine 1% was used for local  anesthesia.  Using modified Seldinger technique, a 4-French sheath was  placed in the right femoral artery.  Under fluoroscopic guidance, a 4-  [place] JL-4 catheter was placed in the left coronary artery.  Multiple  cine films taken at 30-degree RAO and 40-degree LAO views.  This  catheter was then exchanged out over a guidewire for a 4-French 3-D RCA  catheter which successfully engaged in the right coronary ostium.  Multiple cine films were taken at 30-degree RAO and 40-degree LAO views.  This catheter was then exchanged out over a guidewire for a 4-French  angled pigtail catheter which was  placed under fluoroscopic guidance in  the left ventricular cavity.  Left ventriculography was performed in the  30-degree RAO view using a total of 30 mL of contrast at 15 mL per  second.  The catheter was then pulled back across the aortic valve with  no significant gradient noted.  At the end of procedure, all catheters  and sheaths were removed.  Manual compression was performed until  adequate hemostasis was obtained.  The patient was transferred back to  room in stable condition.   RESULTS:  The left main coronary artery is extremely short and is  basically a common ostium for the LAD and left circumflex.   The left anterior descending artery is widely patent throughout its  course of the apex giving rise to 3 diagonal branches.  The first  diagonal branch is a moderate-sized vessel that is widely patent.  The  second diagonal is very small and has a 90% ostial stenosis.  Third  diagonal is widely patent and bifurcates into 2 daughter branches both  of which are widely patent.   The left circumflex  is widely patent and traverses the AV groove.  It  gives rise to a large first obtuse marginal branch which is widely  patent and terminates in a second obtuse marginal branch which is widely  patent and large.   The right coronary artery has luminal irregularities up to 20% in the  proximal and midportion and then distally bifurcates into posterior  descending artery and posterolateral artery both of which are widely  patent.   Left ventriculography shows normal LV function, EF 60%, LV pressure  149/18 mmHg, aortic pressure 141/49 mmHg, LVEDP is 25 mmHg.   ASSESSMENT:  1. One-vessel branch vessel coronary artery disease, 90% small      diagonal 2, too small for percutaneous coronary intervention.  2. Normal left ventricular function.  3. Elevated left ventricular end-diastolic pressure consistent with      diastolic dysfunction.   PLAN:  Start aspirin 81 mg a day.  Start Imdur  30 mg a day.  Change  Norvasc to Cardizem CD 180 mg a day for diastolic dysfunction.  We will  check a fasting lipid panel to make sure her lipids are adequately  controlled.  She will not have her metformin for 48 hours and she will  follow up with me in 2 weeks.      Armanda Magic, M.D.  Electronically Signed     TT/MEDQ  D:  03/07/2009  T:  03/07/2009  Job:  [id number]   cc:   Lonzo Cloud. Kriste Basque, MD

## 2011-03-30 NOTE — Assessment & Plan Note (Signed)
[facility]   NAME:  NAJAT, OLAZABAL                ACCOUNT NO.:  [id number]   MEDICAL RECORD NO.:  [id number]      DATE OF BIRTH:  1956/07/18   PHYSICIAN:  Theresia Majors. Tanda Rockers, M.D. VISIT DATE:  05/17/2007                                   OFFICE VISIT   SUBJECTIVE:  Ms. Sara returns for follow-up of bilateral stasis.  In  the interim she has worn external compression.  She removed the  stockings due to increased tightness.  There has been no laceration or  injury to the lower extremity.  She is accompanied by a friend.   OBJECTIVE:  Blood pressure 154/80, respirations 20, pulse rate 102,  temperature 98.3.  Capillary blood glucose is 95 mg percent.  Inspection  of the lower extremity shows that the legs are shiny and taut,  associated with  2+ edema.  There are no active ulcers, drainage or  signs of infection.   ASSESSMENT:  Chronic venous insufficiency with stasis.   RECOMMENDATIONS:  We will prescribe bilateral below-the-knee open toed  30-40 mm compression hose.  We have given the patient explanations of  the stasis management protocol.  We have given her an opportunity to ask  questions.  She seems to understand.  We are discharging her from active  management in the [facility].  We have indicated a willingness to see  her on a p.r.n. basis, if she develops complications of stasis (i.e.  infection and/or ulceration).  She expresses gratitude for having been  seen in the clinic and indicates that she will be compliant.  She is  discharged.      Harold A. Tanda Rockers, M.D.  Electronically Signed     HAN/MEDQ  D:  05/17/2007  T:  05/18/2007  Job:  [id number]

## 2011-03-30 NOTE — Consult Note (Signed)
Jocelyn [name], Jocelyn [name]                ACCOUNT NO.:  [id number]   MEDICAL RECORD NO.:  [id number]          PATIENT TYPE:  REC   LOCATION:  FOOT                         FACILITY:  [facility]   PHYSICIAN:  Jonelle Sports. Sevier, M.D. DATE OF BIRTH:  September 28, 1956   DATE OF CONSULTATION:  05/11/2007  DATE OF DISCHARGE:                                 CONSULTATION   HISTORY:  This 55 year old black female is seen at the courtesy of Dr.  Joseph Art for treatment of chronic venous insufficiency with edema on the  lower extremities at this time at least without ulceration.   The patient's history is somewhat unusual in that she has had  significant edema in her lower extremities only for about a week or so.  This began in association with some more generalized illness including  pruritus and redness of the skin and in other areas as well.  She was  seen by Dr. Kriste Basque who apparently did a workup including some blood tests  and sent her to Dr. Joseph Art.  He placed her on prednisone and Benadryl for  the pruritus and erythema and she has had considerable improvement.  Her  edema has lessened but persists.  In association with this edema she has  had several hardened and slightly tender areas overlying the veins of  the distal medial aspects of her calves.  She has undergone a venous  Doppler examination which is shown no evidence for venous thrombosis.   PAST MEDICAL HISTORY:  Is notable for tonsillectomy and a cardiac  catheterization, although she is not known to have any heart disease  other than hypertension.  She has had type 2 diabetes of 2 years  duration and that is under reasonable control and she has no  complications from it.   REGULAR MEDICATIONS:  1. Lipitor 10 mg daily.  2. Toprol 25 mg daily.  3. Metformin 500 mg daily.  4. The recent prednisone therapy.   ALLERGIES:  Include CODEINE and MORPHINE.   FAMILY HISTORY:  Again is notable for absence of chronic venous disease  at least of which she is  aware and is otherwise largely unremarkable as  well.   REVIEW OF SYSTEMS:  Shows that she does have hypertension which  apparently been well controlled with her medication.  She has had  diabetes for 2 years as previously indicated.  She has had asthma in the  past but not an active problem.  She has been chronically moderately  obese.   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  VITAL SIGNS:  Her blood pressure is 168/87 suggesting her control may be  suboptimal.  Her heart rate 64, respirations 18.  Her temperature 97.9  and her random blood glucose 103 mg/dL.  HEART:  In that she assures me that her heart has been well evaluated  recently I have not undertaken that portion of examination.  EXTREMITIES:  Her  lower extremities do show fairly tense edema of  moderate degree without pitting from the knees down to the feet and  there is slight discoloration particularly in the medial and gaiter  areas.  There are some darkened palpable slightly tender areas in those  locations which appear to overlie veins.   IMPRESSION:  Suspect that this lady has chronic venous insufficiency  related to her obesity and her edema and venous tenderness is largely on  this basis.  To me her nodularity is not a pattern of erythema nodosum  despite the other generalized symptomatology that went along with this  acute illness nor are they to me suggestive of periarteritis nodosa.  If  she fails respond to limited course of treatment for whatever her acute  pruritic illness is and/or if compressive therapy fails to resolve the  issues, she may need a more complex evaluation.   However, presently we recommend assuming that this is likely a chronic  venous insufficiency and discussion is held with her regarding the  importance of  dealing with it at this stage as opposed to when it has  been present for years and has caused major stasis dermatitis  ulcerations etc.   Accordingly and with her agreement she is today placed in bilateral  lower extremity Profore wraps.  We will plan to leave these for  approximately 6-7 days anticipating that this will probably rid those  lower extremities of edema. At that time she can be transitioned without  interruption into 30/40 compression stockings there knee high.  All of  this is discussed with her and she seems to understand.   Accordingly her return visit here is set for 1 week.           ______________________________  Jonelle Sports. Cheryll Cockayne, M.D.     RES/MEDQ  D:  05/11/2007  T:  05/11/2007  Job:  [id number]   cc:   Karie Soda. Joseph Art, M.D.  Lonzo Cloud. Kriste Basque, MD

## 2011-04-02 NOTE — Procedures (Signed)
[name], Jocelyn                ACCOUNT NO.:  [id number]   MEDICAL RECORD NO.:  [id number]          PATIENT TYPE:  OUT   LOCATION:  SLEEP CENTER                 FACILITY:  [facility]   PHYSICIAN:  Marcelyn Bruins, M.D. [facility] DATE OF BIRTH:  06-23-1956   DATE OF STUDY:  04/30/2005                              NOCTURNAL POLYSOMNOGRAM   REFERRING PHYSICIAN:  Dr. Alroy Dust.   INDICATIONS:  Persistent disorder of initiating and maintaining wakefulness  as well as difficulty initiating and maintaining sleep.   EPWORTH SCORE:  15.   SLEEP ARCHITECTURE:  The patient had a total sleep time of 323 minutes with  decreased REM and never achieved slow-wave sleep. Sleep onset latency was  normal at 25 minutes with REM onset being prolonged at 165 minutes. Sleep  efficiency was 76%.   IMPRESSION:  1.  Very mild obstructive sleep apnea/hypopnea syndrome with a respiratory      disturbance index of five events per hour and O2 desaturation as low as      88%.  All of her events occurred during supine REM. The patient was      noted to have very large numbers of nonspecific arousals and mild to      moderate snoring which raises the question of the upper airway resistant      syndrome.  2.  No clinically significant cardiac arrhythmias.  3.  No significant periodic leg movements noted.  4.  If the patient has significant inappropriate daytime sleepiness, then I      would suggest further sleep evaluation since the sleep study is not      consistent with her symptomatology.     ______________________________  Suzzette Righter    KC/MEDQ  D:  05/13/2005 10:30:22  T:  05/13/2005 11:13:44  Job:  [id number]   cc:   Jocelyn [name], M.D. [facility]

## 2011-04-02 NOTE — Cardiovascular Report (Signed)
[place]  Patient:    Jocelyn [name], Jocelyn [name]                       MRN: [id number] Proc. Date: 11/10/00 Adm. Date:  [id number] Attending:  Veneda Melter CC:         Lonzo Cloud. Kriste Basque, M.D. [facility]  Dietrich Pates, M.D. [facility]  [facility]   Cardiac Catheterization  PROCEDURES PERFORMED: 1. Left heart catheterization. 2. Left ventriculogram. 3. Selective coronary angiography. 4. Intravascular ultrasound, left circumflex artery.  DIAGNOSES: 1. Mild coronary artery disease by angiogram. 2. Normal left ventricular systolic function.  INDICATIONS:  Ms. Hitchman is a 55 year old, black female, with a history of chest discomfort and multiple cardiac risk factors.  The patient has undergone cardiac catheterization on Mar 26, 1999, showing mild coronary artery disease and well preserved LV function.  At that time, she underwent intravascular ultrasound of the left circumflex artery for participation in the REVERSAL study for treatment of hypercholesterolemia.  She presents now for follow-up angiogram and ultrasound.  TECHNIQUE:  After informed consent was obtained, the patient was brought to the cardiac catheterization lab.  Left heart catheterization was performed in the usual fashion via the right femoral artery, a 7 French sheath was used. Intravascular ultrasound was then performed using the Ultra-Cross catheter. Two catheters were used as the first catheter did not appear to be advanced into the distal section of the marginal branch.  The patient tolerated the procedure well and was transferred to the holding area in stable condition where the sheaths was removed and manual pressure applied until an adequate hemostasis was achieved.  FINDINGS:  Findings are as follows: 1. Left main trunk:  The left main trunk is short and normal. 2. Left anterior descending:  This is a medium caliber vessel that provides    three small diagonal branches.   There are mild irregularities in the left    anterior descending artery and its branches. 3. Left circumflex artery:  This is a large caliber vessel that provides a    bifurcating marginal branch in the mid section.  There is a mild narrowing    of 20-30% at the bifurcation.  Further irregularities are noted in the    AV circumflex. 4. Right coronary artery:  The right coronary artery is dominant.  This is a    medium caliber vessel that provides the posterior descending artery and a    small posterior ventricular branch terminally.  There are mild diffuse    disease of 20-30% in the proximal mid section of the RCA.  LEFT VENTRICULOGRAM:  Normal end-systolic and end-diastolic dimensions. Overall left ventricular function is well preserved, ejection fraction of greater than 60%.  No mitral regurgitation.  LV pressure 126/10, aortic is 126/83, LVEDP equals 18.  INTRAVASCULAR ULTRASOUND:  The left circumflex system was performed starting in the distal section of the inferior division of the bifurcating marginal branch.  This showed mild eccentric plaque with a mild amount of calcium.  No critical stenosis.  ASSESSMENT AND PLAN:  Mr. Pizzini is a 55 year old female, with mild coronary artery disease and multiple preserved left ventricular function.  She will be medically managed. DD:  11/10/00 TD:  11/10/00 Job: [id number]

## 2011-04-02 NOTE — Assessment & Plan Note (Signed)
[facility]                           GASTROENTEROLOGY OFFICE NOTE   FARIA, CASELLA                       MRN:          [id number]  DATE:08/01/2006                            DOB:          21-Sep-1956    Ms. Demarinis is a very nice 55 year old African-American female who we saw in  the past for H. 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  Subsequent endoscopy in July, 2003 did not confirm  the presence of granulomatosis.  Her sarcoidosis has been dormant.  Colonoscopy in 2003 was essentially normal.  She was treated with  antispasmodics for irritable bowel syndrome because of diarrhea.  There was  no evidence of inflammatory bowel disease.   She has recently been having a little bit of bloating gas and swelling over  the left part of the abdomen.  She feels it when standing up, that the left  side is pooching out more than the right side.  There has also been some  weight gain.   Her bowel habits are quite regular.  She denies any nausea or vomiting.   MEDICATIONS:  1. Toprol 25 mg daily.  2. Lipitor 10 mg p.o. daily.  3. Actos 30 mg daily.  4. Metformin 500 mg p.o. b.i.d.  5. Birth control pills.  6. Alprazolam 0.25 mg p.r.n.   PAST MEDICAL HISTORY:  Significant for hyperlipidemia, diabetes, high blood  pressure.   OPERATIONS:  Tonsillectomy.   FAMILY HISTORY:  Noncontributory.  Patient was adopted.   SOCIAL HISTORY:  She is single.  Has a college degree.  Works as a Runner, broadcasting/film/video.  She does not smoke or drink alcohol.   REVIEW OF SYSTEMS:  Positive for swelling of her feet, sleeping problems.   PHYSICAL EXAMINATION:  VITAL SIGNS:  Blood pressure 110/60, pulse 68,  respirations 16.  GENERAL:  Patient was in no distress.  She was clearly overweight.  LUNGS:  Clear to auscultation.  COR:  Normal S1, normal S2.  ABDOMEN:  Protuberant.  Obese but very soft.  There was minimal discomfort  diffusely  in the left upper and left middle quadrants but no palpable mass.  No __________ mass.  No CVA tenderness.  Bowel sounds were normoactive.  There was no tympany.  Left lower quadrant was normal.  RECTAL:  Normal rectal tone.  There was no stool specimen in the ampulla.  Mucus was heme negative.   IMPRESSION:  A 55 year old African-American female with nonspecific symptoms  of bloating, abdominal discomfort, and fullness.  The abdominal asymmetry is  questionable.  There is no surgical scar of the abdomen, and the asymmetry  may be just part of her natural habitus, but may need to rule out  possibilities, especially in an obese abdomen, of a pelvic mass.  She had a  pelvic ultrasound four years ago, which showed some ovarian cysts.  Another  problem may be irritable bowel syndrome causing gas and distention.  May  need to rule out bacterial overgrowth or plain constipation.   PLAN:  1. CT scan of the abdomen and pelvis.  2. Benefiber samples 2 tablespoons daily.  3. CT scan of the abdomen and pelvis.  4. Align samples 1 daily for bacterial overgrowth.  5. CBC, sed rate, and sprue panel today.                                   Hedwig Morton. Juanda Chance, MD   DMB/MedQ  DD:  08/01/2006  DT:  08/02/2006  Job #:  [id number]   cc:   Lonzo Cloud. Kriste Basque, MD

## 2011-04-02 NOTE — Procedures (Signed)
Jocelyn [name], Jocelyn [name]                ACCOUNT NO.:  [id number]   MEDICAL RECORD NO.:  [id number]          PATIENT TYPE:  OUT   LOCATION:  SLEEP CENTER                 FACILITY:  [facility]   PHYSICIAN:  Marcelyn Bruins, M.D. [facility] DATE OF BIRTH:  1956-07-15   DATE OF STUDY:  05/11/2006                            MULTIPLE SLEEP LATENCY TEST    REFERRING PHYSICIAN:  Dr. Marcelyn Bruins   INDICATIONS FOR THE STUDY:  Persistent disorder of initiating and  maintaining wakefulness.   EPWORTH SCORE:  23.   NAP TIME:  1.  8:00 a.m. - Sleep latency is 9 minutes.  REM latency is N/A.  2.  10:24 a.m. - Sleep latency 5.5 minutes.  REM latency is N/A.  3.  12:44 p.m. - Sleep latency is 4.6 minutes.  REM latency is N/A.  4.  3:00 p.m. - Sleep latency is 1.8 minutes.  REM latency is N/A.  5.  5:16 p.m. - Sleep latency is 2.5 minutes.  REM latency is N/A.   Mean sleep latency is 4.68 minutes.  Number of REM episodes is 0.   COMMENTS:  The patient has recently undergone nocturnal polysomnography  which showed a respiratory disturbance index less than 5 events per hour.  The patient failed to bring sleep logs to her MSLT.   IMPRESSION/RECOMMENDATION:  Objective daytime sleepiness, documented with a  mean sleep latency of 4.68 minutes.  There were no SOREM's noted.           ______________________________  Marcelyn Bruins, M.D. [facility]  Diplomate, American Board of Sleep  Medicine     KC/MEDQ  D:  06/06/2006 17:23:37  T:  06/06/2006 16:10:96  Job:  [id number]

## 2011-05-06 ENCOUNTER — Other Ambulatory Visit: Payer: Self-pay | Admitting: Endocrinology

## 2011-06-03 ENCOUNTER — Other Ambulatory Visit (INDEPENDENT_AMBULATORY_CARE_PROVIDER_SITE_OTHER): Payer: BC Managed Care – PPO

## 2011-06-03 ENCOUNTER — Other Ambulatory Visit: Payer: Self-pay | Admitting: *Deleted

## 2011-06-03 DIAGNOSIS — E119 Type 2 diabetes mellitus without complications: Secondary | ICD-10-CM

## 2011-06-03 LAB — HEMOGLOBIN A1C: Hgb A1c MFr Bld: 7.2 % — ABNORMAL HIGH (ref 4.6–6.5)

## 2011-06-07 ENCOUNTER — Telehealth: Payer: Self-pay | Admitting: *Deleted

## 2011-06-07 ENCOUNTER — Ambulatory Visit (INDEPENDENT_AMBULATORY_CARE_PROVIDER_SITE_OTHER): Payer: BC Managed Care – PPO | Admitting: Endocrinology

## 2011-06-07 ENCOUNTER — Encounter: Payer: Self-pay | Admitting: Endocrinology

## 2011-06-07 ENCOUNTER — Other Ambulatory Visit: Payer: BC Managed Care – PPO

## 2011-06-07 VITALS — BP 104/68 | HR 80 | Temp 98.7°F | Ht 62.0 in | Wt 185.6 lb

## 2011-06-07 DIAGNOSIS — E119 Type 2 diabetes mellitus without complications: Secondary | ICD-10-CM

## 2011-06-07 LAB — MICROALBUMIN / CREATININE URINE RATIO
Creatinine,U: 300.4 mg/dL
Microalb Creat Ratio: 1.2 mg/g (ref 0.0–30.0)

## 2011-06-07 NOTE — Telephone Encounter (Signed)
Pt is asking MD's advisement on whether Byetta would be an option for pt to try

## 2011-06-07 NOTE — Telephone Encounter (Signed)
i have avoided up till now, due to side-effect of nausea.  i would be happy to rx (this is bid drug.  An alternative is qd, which i would prefer).

## 2011-06-07 NOTE — Progress Notes (Signed)
Subjective:    Patient ID: Jocelyn [name], female    DOB: August 24, 1956, 55 y.o.   MRN: [id number]  HPI Pt says the onglyza did not cause nausea.  no cbg record, but states cbg's are 100-150.  She says her cardiologist advised against adding welchol.  Past Medical History  Diagnosis Date  . Meniere's disease, unspecified   . Allergic rhinitis, cause unspecified   . Acute bronchitis   . Sarcoidosis   . Obstructive sleep apnea (adult) (pediatric)   . Essential hypertension, benign   . Coronary atherosclerosis of unspecified type of vessel, native or graft   . Unspecified venous (peripheral) insufficiency   . Pure hypercholesterolemia   . Type II or unspecified type diabetes mellitus without mention of complication, not stated as uncontrolled   . Irritable bowel syndrome   . Calculus of kidney   . Fibromyalgia   . Vitamin D deficiency   . Dizziness   . Anxiety   . Myalgia and myositis, unspecified   . Calculus of kidney     No past surgical history on file.  History   Social History  . Marital Status: Single    Spouse Name: N/A    Number of Children: 1  . Years of Education: N/A   Occupational History  . Not on file.   Social History Main Topics  . Smoking status: Never Smoker   . Smokeless tobacco: Never Used   Comment: Daily Caffeine - 1  Exercise 2-3 times/weekly  . Alcohol Use: No  . Drug Use: No  . Sexually Active: Not on file   Other Topics Concern  . Not on file   Social History Narrative   Exercises 2-3 times weeklyCaffeine Use: 1 daily (tea or Pepsi)    Current Outpatient Prescriptions on File Prior to Visit  Medication Sig Dispense Refill  . ALPRAZolam (XANAX) 0.5 MG tablet Take 0.5 mg by mouth. Take 1/2 tab mouth mid morning and mid afternoon, the 1 tab at bedtime       . aspirin 81 MG tablet Take 81 mg by mouth daily.        [name] atorvastatin (LIPITOR) 40 MG tablet Take 40 mg by mouth daily.        . bromocriptine (PARLODEL) 2.5 MG tablet Take 2.5 mg  by mouth daily.        . Coenzyme Q10 (CO Q 10) 100 MG CAPS Take 2 capsules by mouth.        . diltiazem (CARDIZEM CD) 240 MG 24 hr capsule Take 240 mg by mouth daily.        [name] ezetimibe (ZETIA) 10 MG tablet Take 10 mg by mouth daily.        . furosemide (LASIX) 20 MG tablet Take 20 mg by mouth daily.        [name] guaiFENesin (MUCINEX) 600 MG 12 hr tablet Take 1,200 mg by mouth 2 (two) times daily.        . isosorbide mononitrate (IMDUR) 120 MG 24 hr tablet Take 120 mg by mouth daily.        [name] levalbuterol (XOPENEX HFA) 45 MCG/ACT inhaler Inhale 1-2 puffs into the lungs every 4 (four) hours as needed.        . meclizine (ANTIVERT) 25 MG tablet Take 25 mg by mouth. 1/2 to 1 tab by mouth every 4 hours as needed for dizziness       . metFORMIN (GLUCOPHAGE-XR) 500 MG 24 hr tablet take 1 tablet by  mouth twice a day  60 tablet  5  . metoprolol (TOPROL-XL) 50 MG 24 hr tablet Take 50 mg by mouth. 1 1/2 once daily       . pantoprazole (PROTONIX) 40 MG tablet TAKE 1 TABLET BY MOUTH ONCE DAILY  . 30 MIN BEFORE THE FIRST MEAL OF THE DAY.  30 tablet  2  . saxagliptin HCl (ONGLYZA) 2.5 MG TABS tablet Take by mouth daily. 1/2 tab each morning       . sertraline (ZOLOFT) 50 MG tablet Take as directed  60 tablet  11  . sodium chloride (OCEAN NASAL SPRAY) 0.65 % nasal spray 1 spray by Nasal route as needed.          Allergies  Allergen Reactions  . Codeine     REACTION: vomiting  . Morphine     REACTION: vomiting  . Pioglitazone     REACTION: pt states INTOL w/ edema  . Sitagliptin Phosphate     REACTION: nausea    Family History  Problem Relation Age of Onset  . Adopted: Yes    BP 104/68  Pulse 80  Temp(Src) 98.7 F (37.1 C) (Oral)  Ht 5\' 2"  (1.575 m)  Wt 185 lb 9.6 oz (84.188 kg)  BMI 33.95 kg/m2  SpO2 96%    Review of Systems Denies diarrhea.     Objective:   Physical Exam Pulses: dorsalis pedis intact bilat.   Feet: no deformity.  no ulcer on the feet.  feet are of normal color and  temp.  1+ bilat leg edema Neuro: sensation is intact to touch on the feet Lab Results  Component Value Date   HGBA1C 7.2* 06/03/2011      Assessment & Plan:  Dm, Needs increased rx, if it can be done with a regimen that avoids or minimizes hypoglycemia.

## 2011-06-07 NOTE — Patient Instructions (Addendum)
Increase onglyza to an entire pill each morning. Please make a follow-up appointment in 3 months check your blood sugar 1 time a day.  vary the time of day when you check, between before the 3 meals, and at bedtime.  also check if you have symptoms of your blood sugar being too high or too low.  please keep a record of the readings and bring it to your next appointment here.  please call us sooner if you are having low blood sugar episodes.

## 2011-06-08 NOTE — Telephone Encounter (Signed)
Pt states that she will try Onglyza first before switching to Byetta and will callback if she feels Onglyza is not working for her.

## 2011-06-10 ENCOUNTER — Other Ambulatory Visit: Payer: Self-pay | Admitting: Pulmonary Disease

## 2011-06-20 ENCOUNTER — Other Ambulatory Visit: Payer: Self-pay | Admitting: Pulmonary Disease

## 2011-07-07 ENCOUNTER — Encounter: Payer: Self-pay | Admitting: Pulmonary Disease

## 2011-08-11 ENCOUNTER — Telehealth: Payer: Self-pay | Admitting: Pulmonary Disease

## 2011-08-11 MED ORDER — AZITHROMYCIN 250 MG PO TABS: ORAL_TABLET | ORAL | Status: AC

## 2011-08-11 NOTE — Telephone Encounter (Signed)
Per SN---ok for pt to have zpak #1  Take as directed with no refills. And use mucinex 2 po bid with plenty of fluids. thanks

## 2011-08-11 NOTE — Telephone Encounter (Signed)
Pt aware of SN recs and rx was sent to the pharmacy

## 2011-08-11 NOTE — Telephone Encounter (Signed)
I spoke with pt and she c/p head congestion, sore throat, bilateral ear pain, frontal headache, sweats, hoarseness x 1 week. Pt is requesting something be called in for her. Pt states she has been taking mucinex BID and a cough syrup (couldn't remember the name). Please advise recs for pt Dr. Kriste Basque. Thanks  Allergies  Allergen Reactions  . Codeine     REACTION: vomiting  . Morphine     REACTION: vomiting  . Pioglitazone     REACTION: pt states INTOL w/ edema  . Sitagliptin Phosphate     REACTION: nausea    Carver Fila, CMA

## 2011-08-16 ENCOUNTER — Telehealth: Payer: Self-pay | Admitting: Pulmonary Disease

## 2011-08-16 DIAGNOSIS — H9209 Otalgia, unspecified ear: Secondary | ICD-10-CM

## 2011-08-16 MED ORDER — DIPHENHYD-HYDROCORT-NYSTATIN MT SUSP: OROMUCOSAL | Status: DC

## 2011-08-16 NOTE — Telephone Encounter (Signed)
I spoke with pt and she states he chest congestion is better. Pt states she is still having right sided ear and throat pain, cough w/ yellow phlem. Pt denies any fever, wheezing, chest tightness. Pt has finished the zpak and is still taking mucinex 2 po BID w/ plenty of fluids. Pt is requesting recs from Dr. Kriste Basque. Please advise Dr. Kriste Basque. Thanks  Allergies  Allergen Reactions  . Codeine     REACTION: vomiting  . Morphine     REACTION: vomiting  . Pioglitazone     REACTION: pt states INTOL w/ edema  . Sitagliptin Phosphate     REACTION: nausea    Carver Fila, CMA

## 2011-08-16 NOTE — Telephone Encounter (Signed)
[facility] [address]. [address]

## 2011-08-16 NOTE — Telephone Encounter (Signed)
Pt aware of SN recs. Order has been sent for ASAP apt and mmw sent to pharmacy. Nothing further was needed

## 2011-08-16 NOTE — Telephone Encounter (Signed)
Per SN----cont the mucinex and add mmw   #4oz  Gargle and swallow four times daily with refills.    She needs ent eval of ear pain to be sure that there is no fluid in middle ear.  Refer to ENT asap.  thanks

## 2011-08-18 ENCOUNTER — Other Ambulatory Visit: Payer: Self-pay | Admitting: Endocrinology

## 2011-08-27 ENCOUNTER — Ambulatory Visit: Payer: BC Managed Care – PPO | Admitting: Pulmonary Disease

## 2011-08-27 ENCOUNTER — Ambulatory Visit: Payer: BC Managed Care – PPO | Admitting: Endocrinology

## 2011-09-08 ENCOUNTER — Ambulatory Visit: Payer: BC Managed Care – PPO

## 2011-09-08 ENCOUNTER — Encounter: Payer: Self-pay | Admitting: Pulmonary Disease

## 2011-09-08 ENCOUNTER — Other Ambulatory Visit: Payer: Self-pay | Admitting: *Deleted

## 2011-09-08 ENCOUNTER — Ambulatory Visit (INDEPENDENT_AMBULATORY_CARE_PROVIDER_SITE_OTHER): Payer: BC Managed Care – PPO | Admitting: Pulmonary Disease

## 2011-09-08 DIAGNOSIS — K219 Gastro-esophageal reflux disease without esophagitis: Secondary | ICD-10-CM

## 2011-09-08 DIAGNOSIS — K589 Irritable bowel syndrome without diarrhea: Secondary | ICD-10-CM

## 2011-09-08 DIAGNOSIS — N200 Calculus of kidney: Secondary | ICD-10-CM

## 2011-09-08 DIAGNOSIS — I10 Essential (primary) hypertension: Secondary | ICD-10-CM

## 2011-09-08 DIAGNOSIS — F411 Generalized anxiety disorder: Secondary | ICD-10-CM

## 2011-09-08 DIAGNOSIS — E119 Type 2 diabetes mellitus without complications: Secondary | ICD-10-CM

## 2011-09-08 DIAGNOSIS — F329 Major depressive disorder, single episode, unspecified: Secondary | ICD-10-CM

## 2011-09-08 DIAGNOSIS — E7800 Pure hypercholesterolemia, unspecified: Secondary | ICD-10-CM

## 2011-09-08 DIAGNOSIS — F32A Depression, unspecified: Secondary | ICD-10-CM

## 2011-09-08 DIAGNOSIS — I2510 Atherosclerotic heart disease of native coronary artery without angina pectoris: Secondary | ICD-10-CM

## 2011-09-08 LAB — HEMOGLOBIN A1C: Hgb A1c MFr Bld: 6.7 % — ABNORMAL HIGH (ref 4.6–6.5)

## 2011-09-08 MED ORDER — ALPRAZOLAM 0.5 MG PO TABS: ORAL_TABLET | ORAL | Status: DC

## 2011-09-08 MED ORDER — PANTOPRAZOLE SODIUM 40 MG PO TBEC: 40.0000 mg | DELAYED_RELEASE_TABLET | Freq: Every day | ORAL | Status: DC

## 2011-09-08 MED ORDER — ESCITALOPRAM OXALATE 10 MG PO TABS: 10.0000 mg | ORAL_TABLET | Freq: Every day | ORAL | Status: DC

## 2011-09-08 NOTE — Progress Notes (Signed)
Subjective:    Patient ID: Jocelyn [name], female    DOB: 11-May-1956, 55 y.o.   MRN: [id number]  HPI  55 y/o BF here for a follow up visit... she has multiple medical problems including:  AR & Asthma;  Hx Sarcoid;  Mild OSA;  HBP;  CAD;  Ven Insuffic;  Hyperchol;  DM;  GERD/ IBS;  Hx Kidney stones;  FM;  Vit D defic;  Anxiety...  ~  August 14, 2010:  she had nutrition counselling to [facility] & wt is down to 199# now... she wanted Endocrine consult & saw DrEllison 6/11 w/ rec for adding Januvia 100mg /d... she states that she's intol to the Januvia & wonders about ch to [place] since her daugh works for [employer] (she will discuss w/ DrEllison during upcoming visit)...  noted more chest tightness & had another Myoview from DrTurner 8/11- it too was neg, no ischemia, no infarct; & her IMDUR has been incr to 120mg /d... DrTurner follows her Lipids and also incr her Lipitor to 40mg /d & added Zetia 10mg  & CoQ10- she doesn't want [name] to check her FLP, DrTurner does this... she had further dizziness (Meniere's) & ENT sent her to [facility] 7/11- we don't have their records but pt says nothing was found...  today c/o incr chest congestion, cough, yellow sput, sl hoarse/ ST, etc... we discussed Rx w/ Augmentin, Mucinex, etc...  ~  February 22, 2011:  79mo ROV- she has been seeing DrEllison for DM management & he placed her on Parlodel (Bromocryptine);  Last seen 1/12 w/ A1c= 7.2 and MetforminER 500mg Bid was added;  Today she c/o feeling sad & anxious all the time- weak, not resting well, under a lot of stress; rec to seek counselling thru her local church or let us refer to [facility], & start RX w/ Zoloft 50==>100mg  Qhs;  Due for fasting labs & TDAP today...  ~  September 08, 2011:  79mo ROV & she has been reasonably stable, no new complaints or concerns;     AR/ AB/ Hx Sarcoid> on Xopenex Prn, Mucinex & MMW; denies cough, sputum, hemoptysis, ch in dyspnea, chest discomfort...    HBP/ CAD> Followed by DrTTurner on  ASA81, Imdur120, ToprolXL75, Cardizem240, Lasix20; BP= 120/80, P= 78/reg; denies CP, palpit, syncope, SOB, edema; she reports check up 7/12 (we don't have note) & doing fine, no changes made...    CHOL> on Lip40, Zetia10, CoQ10; she has lost 9# on diet down to 186# today; FLP here 4/12 was wnl & reviewed w/ pt...    DM> Followed by DrEllison on Metform500Bid, Onglyza2.5, Parlodel2.5hs; A1c= 7.2 in July and repeated today at her request= 6.7; despite this good control she not happy w/ her care & requests second opinion from [facility] & we will refer...    GI> GERD, IBS> on Protonix40; denies abd pain, n/v, d/c/blood...    Anxiety> on Xanax0.5mg  Prn, she was INTOL to Zoloft...        Problems List:  MENIERE'S DISEASE (ICD-3 - eval by ENT, DrBates- Rx w/ diuretic, low salt, Xanax vs Valium, & Antivert Prn... dizziness recurred & ENT sent her to [facility] 7/11- we don't have notes but pt reports that nothing was found.  ALLERGIC RHINITIS - increased allergy symptoms in the spring... rec> Claritin, Saline, Flonase...  Hx of ASTHMATIC BRONCHITIS, ACUTE  - requires occas Antibiotics and Medrol for infectious exac- last Oct09 & resolved w/ Avelox/ Depo/ Medrol... has not been on regular inhalers, but uses XOPENEX MDI (w/ spacer),  MUCINEX & TUSSIONEX Prn... ~  essentially neg CTChest 9/08 after CXR suggested RULnodule. ~  CXR 10/09 showed cardiomegaly, clear lungs, min scoliosis, NAD. ~  CXR 9/11 showed cardiomeg, clear lungs, NAD...  Hx of SARCOIDOSIS  - Dx'd 1980's w/ bronch bx... Rx Pred transiently and improved... no active disease x years... ~  baseline CXRs showed cardiomegaly, clear lungs, min scoliosis, NAD.  Hx of Mild OBSTRUCTIVE SLEEP APNEA - sleep eval DrClance 2006 w/ RDI= 5 only.  HYPERTENSION, BENIGN  - on TOPROL XL 50mg - 1.5 tabs daily, CARDIZEM 240mg /d, LASIX 20mg /d... ~  prev OV's 130-140's / 80's... prev noted sl HA, chest tightness, sl SOB, & mult somatic complaints... Labs &  renal- all WNL. ~  4/12:  BP=122/76 today, and similar at home per pt... She denies CP, palpit, SOB, edema, etc... Labs & renal- all WNL.  CORONARY ARTERY DISEASE - now on ASA 81mg /d, IMDUR 120mg /d, Toprol XL, Cardizem, Lasix as above... Followed by DrTTurner. ~  Cath in 2000 showed non-obstructive CAD (20-30% lesions in all 3 vessels) w/ rec for risk factor modification.   ~  NuclearStressTest 4/07 showed no ischemia & EF=73%. ~  Mar10:  she's concerned about BP and chest tightness, requests Cardiac eval DrTurner & we will refer... ~  4/10 eval by DrTurner w/ Celine Ahr- neg- breast attenuation, no regional wall motion abn, EF= 75% ~  4/10 Cath by DrTurner w/ 90% small 2nd diag branch of LAD <too sm for PTCA> & luminal irreg up to 20% in RCA, +DD- Med Rx. ~  Myoview 8/11 by DrTurner was neg- no ischemia, no infarct.  VENOUS INSUFFICIENCY - she was referred to the [facility] foot clinic by DrWWoods and seen by DrSevier 6/08= ven insuffic Rx w/ TED's, no salt, elevation, etc. ~  10/10: notes bilat ankle ?lipoma in front of the lat malleoli- asymptomatic but several people have noticed them and she request refer to Ortho (rec DrBednarz when she is ready).  HYPERCHOLESTEROLEMIA  - on LIPITOR 40mg /d & ZETIA 10mg /d + CoQ10 per DrTurner... Diet & exercise stressed to the pt. ~  FLP here 12/07 on Lip10 showed TChol 156, TG 94, HDL 51, LDL 86 ~  FLP 1/09 on Lip10 showed TChol 157, TG 122, HDL 49, LDL 84 ~  FLP at [facility] study 5/09 on Lip10 showed TChol 176, TG 116, HDL 55, LDL 98 ~  FLP 3/10 on Lip10 showed TChol 170, TG 94, HDL 59, LDL 93 ~  FLP 6/10 by DrTurner on Lip20 showed TChol 134, TG 82, HDL 55, LDL 73 ~  FLP 4/11 here on Lip20+Zetia? showed TChol 145, TG 74, HDL 68, LDL 63 ~  9/11:  she reports that DrTurner incr Lipitor to 40mg , plus the Zetia 10mg , and she does her FLPs. ~  FLP here 4/12 on Lip40+Zetia showed TChol 153, TG 54, HDL 53, LDL 90... Copy sent to DrTurner  DIABETES MELLITUS  - on Metformin  500mg Bid + PARLODEL 2.5mg /d per DrEllison... She is intol to Actos w/ edema, she stopped Januvia due to ?side effect, she wondered about Tajenta since her daugh works for [employer]. ~  labs 9/08 showed FBS=119, HgA1c=6.6 ~  labs 5/09 at [facility] study showed BS= 108, HgA1c= 6.5 ~  labs 3/10 showed BS= 113, A1c= 6.6 ~  labs 6/10 by DrTurner showed BS= 92; and 3/11 BS= 127 ~  labs 4/11 showed BS= 124, A1c= 7.6.[name]. rec> incr Metform to Bid. ~  6/11:  pt requested Endocrine consult & seen by DrEllison  w/ Januvia 100mg /d added. ~  labs 9/11 showed BS= 98, A1c= 7.3.Marland KitchenMarland Kitchen she stopped Januvia due to side effects & wonders about using Tajenta instead. ~  DrEllison started [employer] 2.5mg /d... She is now taking this +Metform 500mg Bid... ~  Labs 4/12 (wt=195#) showed Bs= 116, A1c= 7.1 ~  Labs 7/12 showed A1c= 7.2;  Umicroalb= 3.5 ~  Labs 10/12 (wt=186#) showed A1c= 6.7 NOTE: She participated in a [facility] trial called the AfricanAmerican- Diabetes Heart Study in 2012; they sent a report indicating: 1) her memory was fine, but her MRI Brain showed sm vessel dis & mild sinus dis, otherw neg; 2) she may have treatable depression but we tried her on Zoloft & she was intol  GERD SYMPTOMS - pt placed on PROTONIX 40mg /d w/ improvement in reflux symptoms...  IRRITABLE BOWEL SYNDROME  - colonoscopy 7/03 by [name] was WNL.  RENAL CALCULUS  - sm stone seen in L kidney on CTScan... she had lithotripsy by [facility] ~9/09.  FIBROMYALGIA  - prev Rx by [name] in 2006.  VITAMIN D DEFICIENCY - Vit D level was 30 at [facility] study in fall 2009... supplemented w/ OTC Vit D 1000 u daily.  ANXIETY - on ALPRAZOLAM 0.5mg  Prn... ~  3/10: w/ her ?Meniere's suggest 1/2 tab Bid days, and 1 tab Qhs.  HEALTH MAINTENANCE: ~  GI:  Followed by [name] & colon due 7/13... ~  GYN:  Followed by DrDillard & she gets yearly PAP, Mammogram, hasn't had baseline BMD yet... ~  Immunizations:  She gets the yearly Flu shots at school;  Had PNEUMOVAX  previously; TDAP given 4/12...   No past surgical history on file - other than Lithotripsy for renal stone...   Outpatient Encounter Prescriptions as of 09/08/2011  Medication Sig Dispense Refill  . ALPRAZolam (XANAX) 0.5 MG tablet take 1/2 tablet by mouth EVERY MID MORNING AND 1/2 TABLET BY MOUTH EVERY MID AFTERNOON then 1 tablet by mouth every evening at bedtime  60 tablet  2  . aspirin 81 MG tablet Take 81 mg by mouth daily.        [name] atorvastatin (LIPITOR) 40 MG tablet Take 40 mg by mouth daily.        . bromocriptine (PARLODEL) 2.5 MG tablet take 1 tablet by mouth at bedtime  30 tablet  5  . Coenzyme Q10 (CO Q 10) 100 MG CAPS Take 2 capsules by mouth.        . diltiazem (CARDIZEM CD) 240 MG 24 hr capsule Take 240 mg by mouth daily.        . Diphenhyd-Hydrocort-Nystatin SUSP Gargle and swallow 4 times a day  4 oz  5  . ezetimibe (ZETIA) 10 MG tablet Take 10 mg by mouth daily.        . furosemide (LASIX) 20 MG tablet Take 20 mg by mouth daily.        [name] guaiFENesin (MUCINEX) 600 MG 12 hr tablet Take 1,200 mg by mouth 2 (two) times daily.        . isosorbide mononitrate (IMDUR) 120 MG 24 hr tablet Take 120 mg by mouth daily.        [name] levalbuterol (XOPENEX HFA) 45 MCG/ACT inhaler Inhale 1-2 puffs into the lungs every 4 (four) hours as needed.        . meclizine (ANTIVERT) 25 MG tablet Take 25 mg by mouth. 1/2 to 1 tab by mouth every 4 hours as needed for dizziness       . metFORMIN (GLUCOPHAGE-XR) 500  MG 24 hr tablet take 1 tablet by mouth twice a day  60 tablet  5  . metoprolol (TOPROL-XL) 50 MG 24 hr tablet Take 50 mg by mouth. 1 1/2 once daily       . pantoprazole (PROTONIX) 40 MG tablet TAKE 1 TABLET BY MOUTH ONCE DAILY  . 30 MIN BEFORE THE FIRST MEAL OF THE DAY.  30 tablet  2  . saxagliptin HCl (ONGLYZA) 2.5 MG TABS tablet Take by mouth daily. 1 tab each morning      . sodium chloride (OCEAN NASAL SPRAY) 0.65 % nasal spray 1 spray by Nasal route as needed.        [name] DISCONTD: sertraline  (ZOLOFT) 50 MG tablet Take as directed  60 tablet  11    Allergies  Allergen Reactions  . Codeine     REACTION: vomiting  . Morphine     REACTION: vomiting  . Pioglitazone     REACTION: pt states INTOL w/ edema  . Sitagliptin Phosphate     REACTION: nausea    Current Medications, Allergies, Past Medical History, Past Surgical History, Family History, and Social History were reviewed in [employer] record.   Review of Systems        See HPI - all other systems neg except as noted...      The patient complains of weight gain, dyspnea on exertion, and peripheral edema.  The patient denies anorexia, fever, weight loss, vision loss, decreased hearing, hoarseness, chest pain, syncope, prolonged cough, headaches, hemoptysis, abdominal pain, melena, hematochezia, severe indigestion/heartburn, hematuria, incontinence, genital sores, suspicious skin lesions, transient blindness, difficulty walking, depression, unusual weight change, abnormal bleeding, enlarged lymph nodes, and angioedema.     Objective:   Physical Exam     WD, Overweight, 54 y/o BF in NAD... GENERAL:  Alert & oriented; pleasant & cooperative. HEENT:  [place] /AT, EOM-wnl, PERRLA, EACs-clear, TMs-wnl, NOSE-clear, THROAT-clear & wnl. NECK:  Supple w/ fairROM; no JVD; normal carotid impulses w/o bruits; no thyromegaly or nodules palpated; no lymphadenopathy. CHEST:  Clear to P & A; without wheezes/ rales/ or rhonchi heard... HEART:  Regular Rhythm; without murmurs/ rubs/ or gallops. ABDOMEN:  Soft & nontender; normal bowel sounds; no organomegaly or masses detected. EXT: without deformities, mild arthritic changes; no varicose veins but +venous insuffic & tr edema. NEURO:  CN's intact;  no focal neuro deficits... DERM:  No lesions noted; no rash etc...   Assessment & Plan:   RESP>  Hx AR, Asthma, old sarcoid, mild OSA>  All stable, breathing OK, but she notes some wheezing w/ exercise;  On Mucinex & asked  to use her Xopenex prior to exercise; we could consider a leukotriene receptor modifier in the future if nec...  CARDS>  Followed by DrTTurner on meds listed> Hx HBP, CAD, VI, etc;  Doing satis & we reviewed exercise program etc...  CHOL>  Doing satis on the Lip + Zetia; good job w/ wt reduction, keep up the good work...  DM>  Followed by DrEllison on Metformin, Onglyza + Parlodel; A1c improved to 6.7 today;  rec continue meds & f/u w/ Endocrine; low carb diet, get wt down...  GI>  Stable on Protonix as needed...  FM>  She states doing well...  Anxiety>  She remains on Alprazolam as needed.Marland KitchenMarland Kitchen

## 2011-09-08 NOTE — Patient Instructions (Signed)
Today we updated your med list in our EPIC system...    Continue your current medications the same...    We decided to try LEXAPRO 10mg  one tab daily as an antidepressant...  Today we did your follow up A1c...    Please call the PHONE [facility]REE in a few days for your results...    Dial [id number] & when prompted enter your patient number followed by the # symbol...    Your patient number is:  [id number]#  Keep up the good work w/ diet/ exercise/ wt reduction!!!  We will seek a second opinion Endocrine consult per your request w/ the [facility] endocrine group...  Let's plan a follow up review in 6 months.Marland KitchenMarland Kitchen

## 2011-09-18 ENCOUNTER — Encounter: Payer: Self-pay | Admitting: Pulmonary Disease

## 2011-10-12 ENCOUNTER — Telehealth: Payer: Self-pay | Admitting: Pulmonary Disease

## 2011-10-12 MED ORDER — DIPHENOXYLATE-ATROPINE 2.5-0.025 MG PO TABS: 1.0000 | ORAL_TABLET | Freq: Four times a day (QID) | ORAL | Status: DC | PRN

## 2011-10-12 MED ORDER — CHOLESTYRAMINE 4 G PO PACK: 1.0000 | PACK | Freq: Every day | ORAL | Status: DC | PRN

## 2011-10-12 NOTE — Telephone Encounter (Signed)
Called and spoke with pt and she stated she has had chills, headache, nausea, diarrhea x 2 days---pt stated that the headache and chills are better today but unable to control the diarrhea with immodium---unable to eat but is taking in some ginger ale.  Pt stated that she thinks she has been running a fever but unable to check her temp.  SN please advise.  Thanks  Allergies  Allergen Reactions  . Codeine     REACTION: vomiting  . Morphine     REACTION: vomiting  . Pioglitazone     REACTION: pt states INTOL w/ edema  . Sitagliptin Phosphate     REACTION: nausea

## 2011-10-12 NOTE — Telephone Encounter (Signed)
Per SN---ok to send in [place] packets  #30   1 packet daily prn  And lomotil #12   1 every 6 hours prn for watery diarrhea.  These meds have been sent to the pharmacy and i called and spoke with pt and she is aware.

## 2011-10-22 ENCOUNTER — Telehealth: Payer: Self-pay | Admitting: Pulmonary Disease

## 2011-10-22 MED ORDER — AZITHROMYCIN 250 MG PO TABS: ORAL_TABLET | ORAL | Status: AC

## 2011-10-22 NOTE — Telephone Encounter (Signed)
Spoke with pt. She c/o prod cough with large amounts of green sputum x 2 days, hoarseness and chills/sweats. She denied any CP, fever, SOB. She would like abx called in. States taking mucinex without relief. Please advise, thanks! Allergies  Allergen Reactions  . Codeine     REACTION: vomiting  . Morphine     REACTION: vomiting  . Pioglitazone     REACTION: pt states INTOL w/ edema  . Sitagliptin Phosphate     REACTION: nausea

## 2011-10-22 NOTE — Telephone Encounter (Signed)
Zpack #1 take as directed, no refills  mucinex DM As needed   Fluids and rest  Tylenol As needed   Please contact office for sooner follow up if symptoms do not improve or worsen or seek emergency care

## 2011-10-22 NOTE — Telephone Encounter (Signed)
The patient is aware of TP recs and will call if her sxs do not improve or seek emergency help. RX sent to [facility].

## 2011-11-06 ENCOUNTER — Other Ambulatory Visit: Payer: Self-pay | Admitting: Endocrinology

## 2011-12-28 ENCOUNTER — Telehealth: Payer: Self-pay | Admitting: Pulmonary Disease

## 2011-12-28 MED ORDER — PREDNISONE (PAK) 5 MG PO TABS: ORAL_TABLET | ORAL | Status: DC

## 2011-12-28 MED ORDER — AMOXICILLIN-POT CLAVULANATE 875-125 MG PO TABS: 1.0000 | ORAL_TABLET | Freq: Two times a day (BID) | ORAL | Status: AC

## 2011-12-28 NOTE — Telephone Encounter (Signed)
Spoke with pt and notified of recs per SN. Pt verbalized understanding and rxs was called to pharm.

## 2011-12-28 NOTE — Telephone Encounter (Signed)
Spoke with pt. She states having sinus pressure/HA and chest congestion for approx 1 month now. She also c/o prod cough with large amounts of yellow to green sputum. I offered ov with TP in [place] and she refused due to location and states prefers recs from SN. Please advise, thanks! Allergies  Allergen Reactions  . Codeine     REACTION: vomiting  . Morphine     REACTION: vomiting  . Pioglitazone     REACTION: pt states INTOL w/ edema  . Sitagliptin Phosphate     REACTION: nausea

## 2011-12-28 NOTE — Telephone Encounter (Signed)
Per SN---ok for pt to have augmentin 875mg   #20   1 po bid until gone, mucinex otc  2 po bid with plenty of fluids,  And pred dosepak  5 mg   6 day pack.  thanks

## 2012-01-13 ENCOUNTER — Telehealth: Payer: Self-pay | Admitting: Pulmonary Disease

## 2012-01-13 NOTE — Telephone Encounter (Signed)
Pantoprazole Pa initiated with [employer] at [id number]. Member ID # [id number]. This has been APPROVED from 01/12/12 through 01/12/2013. Pt and pharmacy have been notified. Case # [id number].

## 2012-03-06 ENCOUNTER — Other Ambulatory Visit: Payer: Self-pay | Admitting: Endocrinology

## 2012-03-08 ENCOUNTER — Ambulatory Visit: Payer: BC Managed Care – PPO | Admitting: Pulmonary Disease

## 2012-03-08 ENCOUNTER — Telehealth: Payer: Self-pay | Admitting: Obstetrics and Gynecology

## 2012-03-08 DIAGNOSIS — Z780 Asymptomatic menopausal state: Secondary | ICD-10-CM

## 2012-03-08 NOTE — Telephone Encounter (Signed)
Routed to niccole 

## 2012-03-09 NOTE — Telephone Encounter (Signed)
Spoke with pt rgd msg pt to have FSH done per nd after 1 month offf bc pt had lab apt 03/09/12 at 3:00 pt voice understanding

## 2012-03-10 DIAGNOSIS — Z780 Asymptomatic menopausal state: Secondary | ICD-10-CM

## 2012-03-10 LAB — FOLLICLE STIMULATING HORMONE: FSH: 85.4 m[IU]/mL

## 2012-03-13 ENCOUNTER — Telehealth: Payer: Self-pay

## 2012-03-13 NOTE — Telephone Encounter (Signed)
Message copied by Rolla Plate on Mon Mar 13, 2012  9:01 AM ------      Message from: Jaymes Graff      Created: Sun Mar 12, 2012 10:36 PM       Please tell the patient that her labs are consistent with menopause and she can stop her birth control.  Please put her chart on my desk. thanks

## 2012-03-13 NOTE — Telephone Encounter (Signed)
Spoke with pt rgd labs informed pt labs consistent with menopause can stop bc pt voice understanding

## 2012-03-23 ENCOUNTER — Encounter: Payer: Self-pay | Admitting: Obstetrics and Gynecology

## 2012-03-23 ENCOUNTER — Ambulatory Visit (INDEPENDENT_AMBULATORY_CARE_PROVIDER_SITE_OTHER): Payer: BC Managed Care – PPO | Admitting: Obstetrics and Gynecology

## 2012-03-23 VITALS — BP 130/88 | Ht 62.0 in | Wt 187.0 lb

## 2012-03-23 DIAGNOSIS — N951 Menopausal and female climacteric states: Secondary | ICD-10-CM

## 2012-03-23 MED ORDER — CONJ ESTROG-MEDROXYPROGEST ACE 0.3-1.5 MG PO TABS: 1.0000 | ORAL_TABLET | Freq: Every day | ORAL | Status: DC

## 2012-03-23 NOTE — Patient Instructions (Signed)

## 2012-03-23 NOTE — Progress Notes (Signed)
Menopausal symptoms:anxiety, decreased libido, dry skin, hot flashes, insomnia, moodiness, no energy, vaginal dryness  The patient is not taking hormone replacement therapy The patient  is taking a Calcium supplement. The patient participates in regular exercise: no. Post-menopausal bleeding:no  The patient is sexually active.  Last Pap: was normal July  2012 Last mammogram: was normal October  2009 Last DEXA scan : T= n/a      History of DVT/PE: No Family history of breast cancer: No Family history of endometrial cancer:No Pt desires HRT Physical Examination: General appearance - alert, well appearing, and in no distress Mental status - alert, oriented to person, place, and time Heart - normal rate and regular rhythm Abdomen - soft, nontender, nondistended, no masses or organomegaly Back exam - full range of motion, no tenderness, palpable spasm or pain on motion Neurological - alert, oriented, normal speech, no focal findings or movement disorder noted Musculoskeletal - no joint tenderness, deformity or swelling Extremities - peripheral pulses normal, no pedal edema, no clubbing or cyanosis Skin - normal coloration and turgor, no rashes, no suspicious skin lesions noted Menopausal symptoms All treatments reviwed with pt Pt chose premro R&B reviewed with the pt rx given for prempro Melatonin prn sleep

## 2012-05-02 ENCOUNTER — Ambulatory Visit: Payer: BC Managed Care – PPO | Admitting: Pulmonary Disease

## 2012-05-19 ENCOUNTER — Other Ambulatory Visit: Payer: Self-pay | Admitting: Endocrinology

## 2012-06-02 ENCOUNTER — Ambulatory Visit (INDEPENDENT_AMBULATORY_CARE_PROVIDER_SITE_OTHER): Payer: BC Managed Care – PPO | Admitting: Pulmonary Disease

## 2012-06-02 ENCOUNTER — Encounter: Payer: Self-pay | Admitting: Pulmonary Disease

## 2012-06-02 VITALS — BP 110/68 | HR 93 | Temp 96.8°F | Ht 62.0 in | Wt 182.8 lb

## 2012-06-02 DIAGNOSIS — F329 Major depressive disorder, single episode, unspecified: Secondary | ICD-10-CM

## 2012-06-02 DIAGNOSIS — E559 Vitamin D deficiency, unspecified: Secondary | ICD-10-CM

## 2012-06-02 DIAGNOSIS — I2510 Atherosclerotic heart disease of native coronary artery without angina pectoris: Secondary | ICD-10-CM

## 2012-06-02 DIAGNOSIS — K589 Irritable bowel syndrome without diarrhea: Secondary | ICD-10-CM

## 2012-06-02 DIAGNOSIS — F411 Generalized anxiety disorder: Secondary | ICD-10-CM

## 2012-06-02 DIAGNOSIS — E119 Type 2 diabetes mellitus without complications: Secondary | ICD-10-CM

## 2012-06-02 DIAGNOSIS — F32A Depression, unspecified: Secondary | ICD-10-CM

## 2012-06-02 DIAGNOSIS — IMO0001 Reserved for inherently not codable concepts without codable children: Secondary | ICD-10-CM

## 2012-06-02 DIAGNOSIS — I872 Venous insufficiency (chronic) (peripheral): Secondary | ICD-10-CM

## 2012-06-02 DIAGNOSIS — K219 Gastro-esophageal reflux disease without esophagitis: Secondary | ICD-10-CM

## 2012-06-02 DIAGNOSIS — I10 Essential (primary) hypertension: Secondary | ICD-10-CM

## 2012-06-02 DIAGNOSIS — E7800 Pure hypercholesterolemia, unspecified: Secondary | ICD-10-CM

## 2012-06-02 MED ORDER — ONDANSETRON 4 MG PO TBDP: 4.0000 mg | ORAL_TABLET | Freq: Four times a day (QID) | ORAL | Status: AC | PRN

## 2012-06-02 MED ORDER — ESCITALOPRAM OXALATE 20 MG PO TABS: 20.0000 mg | ORAL_TABLET | Freq: Every day | ORAL | Status: AC

## 2012-06-02 MED ORDER — ALPRAZOLAM 0.5 MG PO TABS: ORAL_TABLET | ORAL | Status: DC

## 2012-06-02 NOTE — Patient Instructions (Addendum)
Today we updated your med list in our EPIC system...    Continue your current medications the same...  We decided to increase the LEXAPRO to 20mg  daily...  We refilled your Zofran ODT for nausea (as needed)...  Continue your regular follow up visits w/ your Cardiologist, DrTTurner; and your Endocrine, [name]...  Call for any questions or if we can be of service in any way.Marland KitchenMarland Kitchen

## 2012-06-04 ENCOUNTER — Encounter: Payer: Self-pay | Admitting: Pulmonary Disease

## 2012-06-04 NOTE — Progress Notes (Signed)
Subjective:    Patient ID: Jocelyn [name], female    DOB: 01-05-1956, 56 y.o.   MRN: [id number]  HPI 56 y/o BF here for a follow up visit... she has multiple medical problems including:  AR & Asthma;  Hx Sarcoid;  Mild OSA;  HBP;  CAD;  Ven Insuffic;  Hyperchol;  DM;  GERD/ IBS;  Hx Kidney stones;  FM;  Vit D defic;  Anxiety...  ~  August 14, 2010:  she had nutrition counselling to [facility] & wt is down to 199# now... she wanted Endocrine consult & saw DrEllison 6/11 w/ rec for adding Januvia 100mg /d... she states that she's intol to the Januvia & wonders about ch to [place] since her daugh works for [employer] (she will discuss w/ DrEllison during upcoming visit)...  noted more chest tightness & had another Myoview from DrTurner 8/11- it too was neg, no ischemia, no infarct; & her IMDUR has been incr to 120mg /d... DrTurner follows her Lipids and also incr her Lipitor to 40mg /d & added Zetia 10mg  & CoQ10- she doesn't want [name] to check her FLP, DrTurner does this... she had further dizziness (Meniere's) & ENT sent her to [facility] 7/11- we don't have their records but pt says nothing was found...  today c/o incr chest congestion, cough, yellow sput, sl hoarse/ ST, etc... we discussed Rx w/ Augmentin, Mucinex, etc...  ~  February 22, 2011:  42mo ROV- she has been seeing DrEllison for DM management & he placed her on Parlodel (Bromocryptine);  Last seen 1/12 w/ A1c= 7.2 and MetforminER 500mg Bid was added;  Today she c/o feeling sad & anxious all the time- weak, not resting well, under a lot of stress; rec to seek counselling thru her local church or let us refer to [facility], & start RX w/ Zoloft 50==>100mg  Qhs;  Due for fasting labs & TDAP today...  ~  September 08, 2011:  42mo ROV & she has been reasonably stable, no new complaints or concerns;     AR/ AB/ Hx Sarcoid> on Xopenex Prn, Mucinex & MMW; denies cough, sputum, hemoptysis, ch in dyspnea, chest discomfort...    HBP/ CAD> Followed by DrTTurner on  ASA81, Imdur120, ToprolXL75, Cardizem240, Lasix20; BP= 120/80, P= 78/reg; denies CP, palpit, syncope, SOB, edema; she reports check up 7/12 (we don't have note) & doing fine, no changes made...    CHOL> on Lip40, Zetia10, CoQ10; she has lost 9# on diet down to 186# today; FLP here 4/12 was wnl & reviewed w/ pt...    DM> Followed by DrEllison on Metform500Bid, Onglyza2.5, Parlodel2.5hs; A1c= 7.2 in July and repeated today at her request= 6.7; despite this good control she not happy w/ her care & requests second opinion from [facility] & we will refer...    GI> GERD, IBS> on Protonix40; denies abd pain, n/v, d/c/blood...    Anxiety> on Xanax0.5mg  Prn, she was INTOL to Zoloft...  ~  June 02, 2012:  110mo ROV & Jocelyn [name] says she is doing well w/o new complaints or concerns...    AR/ AB/ Hx Sarcoid> on Mucinex & MMW prn; denies cough, sputum, hemoptysis, ch in dyspnea, chest discomfort; she has SOB/DOE but is too sedentary & needs to incr exercise program...    HBP/ CAD> Followed by DrTTurner on ASA81, Imdur120, ToprolXL75, Cardizem240, Lasix20;  BP= 110/68 & denies CP, palpit, syncope, SOB, edema; she reports check up 5/13 (we don't have note) & they added RANEXA 500mg  Bid for her CP.Marland KitchenMarland Kitchen  CHOL> on Lip40, Zetia10, CoQ10; she has lost 3# on diet down to 183# today; FLP is followed by DrTTurner per pt but we don't have copies; pt reports concern for "particle size" & she was rec to enter into a drug trial...    DM> Now followed by [name] on Metform500Bid & Onglyza2.5; last A1c here= 6.7; we don't have notes from [name]; she reports doing ok...    GI> GERD, IBS> on Protonix40; denies abd pain, n/v, d/c/blood...    Anxiety> on Xanax0.5mg  Prn, she was INTOL to Zoloft & on Lexapro10mg  w/ rec to incr to 20mg  today... She saw DrDillard for GYN 5/13> mult menopausal symptoms & she was started on Prempro...        Problems List:  MENIERE'S DISEASE (ICD-3 - eval by ENT, DrBates- Rx w/ diuretic, low salt, Xanax  vs Valium, & Antivert Prn... dizziness recurred & ENT sent her to [facility] 7/11- we don't have notes but pt reports that nothing was found.  ALLERGIC RHINITIS - increased allergy symptoms in the spring... rec> Claritin, Saline, Flonase...  Hx of ASTHMATIC BRONCHITIS, ACUTE  - requires occas Antibiotics and Medrol for infectious exac- last Oct09 & resolved w/ Avelox/ Depo/ Medrol... has not been on regular inhalers, but uses XOPENEX MDI (w/ spacer), MUCINEX & TUSSIONEX Prn... ~  essentially neg CTChest 9/08 after CXR suggested RULnodule. ~  CXR 10/09 showed cardiomegaly, clear lungs, min scoliosis, NAD. ~  CXR 9/11 showed cardiomeg, clear lungs, NAD...  Hx of SARCOIDOSIS  - Dx'd 1980's w/ bronch bx... Rx Pred transiently and improved... no active disease x years... ~  baseline CXRs showed cardiomegaly, clear lungs, min scoliosis, NAD.  Hx of Mild OBSTRUCTIVE SLEEP APNEA - sleep eval DrClance 2006 w/ RDI= 5 only.  HYPERTENSION, BENIGN  - on TOPROL XL 50mg - 1.5 tabs daily, CARDIZEM 240mg /d, LASIX 20mg /d... ~  prev OV's 130-140's / 80's... prev noted sl HA, chest tightness, sl SOB, & mult somatic complaints... Labs & renal- all WNL. ~  4/12:  BP=122/76 today, and similar at home per pt... She denies CP, palpit, SOB, edema, etc... Labs & renal- all WNL.  CORONARY ARTERY DISEASE - now on ASA 81mg /d, IMDUR 120mg /d, Toprol XL, Cardizem, Lasix as above... Followed by DrTTurner. ~  Cath in 2000 showed non-obstructive CAD (20-30% lesions in all 3 vessels) w/ rec for risk factor modification.   ~  NuclearStressTest 4/07 showed no ischemia & EF=73%. ~  Mar10:  she's concerned about BP and chest tightness, requests Cardiac eval DrTurner & we will refer... ~  4/10 eval by DrTurner w/ Celine Ahr- neg- breast attenuation, no regional wall motion abn, EF= 75% ~  4/10 Cath by DrTurner w/ 90% small 2nd diag branch of LAD <too sm for PTCA> & luminal irreg up to 20% in RCA, +DD- Med Rx. ~  Myoview 8/11 by DrTurner was  neg- no ischemia, no infarct.  VENOUS INSUFFICIENCY - she was referred to the [facility] foot clinic by DrWWoods and seen by DrSevier 6/08= ven insuffic Rx w/ TED's, no salt, elevation, etc. ~  10/10: notes bilat ankle ?lipoma in front of the lat malleoli- asymptomatic but several people have noticed them and she request refer to Ortho (rec DrBednarz when she is ready).  HYPERCHOLESTEROLEMIA  - on LIPITOR 40mg /d & ZETIA 10mg /d + CoQ10 per DrTurner... Diet & exercise stressed to the pt. ~  FLP here 12/07 on Lip10 showed TChol 156, TG 94, HDL 51, LDL 86 ~  FLP 1/09 on Lip10 showed TChol  157, TG 122, HDL 49, LDL 84 ~  FLP at [facility] study 5/09 on Lip10 showed TChol 176, TG 116, HDL 55, LDL 98 ~  FLP 3/10 on Lip10 showed TChol 170, TG 94, HDL 59, LDL 93 ~  FLP 6/10 by DrTurner on Lip20 showed TChol 134, TG 82, HDL 55, LDL 73 ~  FLP 4/11 here on Lip20+Zetia? showed TChol 145, TG 74, HDL 68, LDL 63 ~  9/11:  she reports that DrTurner incr Lipitor to 40mg , plus the Zetia 10mg , and she does her FLPs. ~  FLP here 4/12 on Lip40+Zetia showed TChol 153, TG 54, HDL 53, LDL 90... Copy sent to DrTurner  DIABETES MELLITUS  - on Metformin 500mg Bid + PARLODEL 2.5mg /d per DrEllison... She is intol to Actos w/ edema, she stopped Januvia due to ?side effect, she wondered about Tajenta since her daugh works for [employer]. ~  labs 9/08 showed FBS=119, HgA1c=6.6 ~  labs 5/09 at [facility] study showed BS= 108, HgA1c= 6.5 ~  labs 3/10 showed BS= 113, A1c= 6.6 ~  labs 6/10 by DrTurner showed BS= 92; and 3/11 BS= 127 ~  labs 4/11 showed BS= 124, A1c= 7.6.[name]. rec> incr Metform to Bid. ~  6/11:  pt requested Endocrine consult & seen by DrEllison w/ Januvia 100mg /d added. ~  labs 9/11 showed BS= 98, A1c= 7.3.Marland KitchenMarland Kitchen she stopped Januvia due to side effects & wonders about using Tajenta instead. ~  DrEllison started Bromocyptine 2.5mg /d... She is now taking this +Metform 500mg Bid... ~  Labs 4/12 (wt=195#) showed Bs= 116, A1c= 7.1 ~  Labs 7/12 showed  A1c= 7.2;  Umicroalb= 3.5 ~  Labs 10/12 (wt=186#) showed A1c= 6.7 NOTE: She participated in a [facility] trial called the AfricanAmerican- Diabetes Heart Study in 2012; they sent a report indicating: 1) her memory was fine, but her MRI Brain showed sm vessel dis & mild sinus dis, otherw neg; 2) she may have treatable depression but we tried her on Zoloft & she was intol==> ch to Lexapro.  GERD SYMPTOMS - pt placed on PROTONIX 40mg /d w/ improvement in reflux symptoms...  IRRITABLE BOWEL SYNDROME  - colonoscopy 7/03 by [name] was WNL.  RENAL CALCULUS  - sm stone seen in L kidney on CTScan... she had lithotripsy by [facility] ~9/09.  FIBROMYALGIA  - prev Rx by [name] in 2006.  VITAMIN D DEFICIENCY - Vit D level was 30 at [facility] study in fall 2009... supplemented w/ OTC Vit D 1000 u daily.  ANXIETY - on ALPRAZOLAM 0.5mg  Prn... DEPRESSION - on LEXAPRO incr to 20mg /d...  HEALTH MAINTENANCE: ~  GI:  Followed by [name] & colon due 7/13... ~  GYN:  Followed by DrDillard & she gets yearly PAP, Mammogram, hasn't had baseline BMD yet, started on [facility] 5/13... ~  Immunizations:  She gets the yearly Flu shots at school;  Had PNEUMOVAX previously; TDAP given 4/12...   No past surgical history on file - other than Lithotripsy for renal stone...   Outpatient Encounter Prescriptions as of 06/02/2012  Medication Sig Dispense Refill  . ALPRAZolam (XANAX) 0.5 MG tablet Take 1/2 tab let by mouth every mid morning and 1/2 tablet every mid afternoon and 1 tablet at bedtime  60 tablet  5  . aspirin 81 MG tablet Take 81 mg by mouth daily.        [name] atorvastatin (LIPITOR) 40 MG tablet Take 40 mg by mouth daily.        . Coenzyme Q10 (CO Q 10) 100 MG CAPS Take  2 capsules by mouth.        . diltiazem (CARDIZEM CD) 240 MG 24 hr capsule Take 240 mg by mouth daily.        [name] estrogen, conjugated,-medroxyprogesterone (PREMPRO) 0.3-1.5 MG per tablet Take 1 tablet by mouth daily.  30 tablet  11  . ezetimibe (ZETIA) 10 MG  tablet Take 10 mg by mouth daily.        . furosemide (LASIX) 20 MG tablet Take 20 mg by mouth daily.        . isosorbide mononitrate (IMDUR) 120 MG 24 hr tablet Take 120 mg by mouth daily.        . meclizine (ANTIVERT) 25 MG tablet Take 25 mg by mouth. 1/2 to 1 tab by mouth every 4 hours as needed for dizziness       . metFORMIN (GLUCOPHAGE-XR) 500 MG 24 hr tablet take 1 tablet by mouth twice a day  60 tablet  2  . metoprolol (TOPROL-XL) 50 MG 24 hr tablet 1 1/2 once daily      . pantoprazole (PROTONIX) 40 MG tablet Take 1 tablet (40 mg total) by mouth daily. 30 minutes before the first meal of the day  30 tablet  11  . ranolazine (RANEXA) 500 MG 12 hr tablet Take 500 mg by mouth 2 (two) times daily. Per Dr. Mayford Knife      . saxagliptin HCl (ONGLYZA) 2.5 MG TABS tablet Take by mouth daily. 1 tab each morning      . sodium chloride (OCEAN NASAL SPRAY) 0.65 % nasal spray 1 spray by Nasal route as needed.        [name] DISCONTD: ALPRAZolam (XANAX) 0.5 MG tablet Take 1/2 tab let by mouth every mid morning and 1/2 tablet every mid afternoon and 1 tablet at bedtime  60 tablet  5  . escitalopram (LEXAPRO) 20 MG tablet Take 1 tablet (20 mg total) by mouth daily.  30 tablet  11  . ondansetron (ZOFRAN ODT) 4 MG disintegrating tablet Take 1 tablet (4 mg total) by mouth every 6 (six) hours as needed for nausea.  25 tablet  5  . DISCONTD: bromocriptine (PARLODEL) 2.5 MG tablet take 1 tablet by mouth at bedtime  30 tablet  2  . DISCONTD: cholestyramine (QUESTRAN) 4 G packet Take 1 packet by mouth daily as needed. Mix in water or juice as needed for diarrhea  30 each  5  . DISCONTD: Diphenhyd-Hydrocort-Nystatin SUSP Gargle and swallow 4 times a day  4 oz  5  . DISCONTD: diphenoxylate-atropine (LOMOTIL) 2.5-0.025 MG per tablet Take 1 tablet by mouth every 6 (six) hours as needed for diarrhea/loose stools.  12 tablet  0  . DISCONTD: escitalopram (LEXAPRO) 10 MG tablet Take 1 tablet (10 mg total) by mouth daily.  30 tablet   5  . DISCONTD: guaiFENesin (MUCINEX) 600 MG 12 hr tablet Take 1,200 mg by mouth 2 (two) times daily.        [name] DISCONTD: levalbuterol (XOPENEX HFA) 45 MCG/ACT inhaler Inhale 1-2 puffs into the lungs every 4 (four) hours as needed.        [name] DISCONTD: predniSONE (STERAPRED UNI-PAK) 5 MG TABS 6 day pack as directed  21 tablet  0    Allergies  Allergen Reactions  . Codeine     REACTION: vomiting  . Morphine     REACTION: vomiting  . Pioglitazone     REACTION: pt states INTOL w/ edema  . Sitagliptin Phosphate  REACTION: nausea    Current Medications, Allergies, Past Medical History, Past Surgical History, Family History, and Social History were reviewed in [employer] record.   Review of Systems        See HPI - all other systems neg except as noted...      The patient complains of weight gain, dyspnea on exertion, and peripheral edema.  The patient denies anorexia, fever, weight loss, vision loss, decreased hearing, hoarseness, chest pain, syncope, prolonged cough, headaches, hemoptysis, abdominal pain, melena, hematochezia, severe indigestion/heartburn, hematuria, incontinence, genital sores, suspicious skin lesions, transient blindness, difficulty walking, depression, unusual weight change, abnormal bleeding, enlarged lymph nodes, and angioedema.     Objective:   Physical Exam     WD, Overweight, 55 y/o BF in NAD... GENERAL:  Alert & oriented; pleasant & cooperative. HEENT:  /AT, EOM-wnl, PERRLA, EACs-clear, TMs-wnl, NOSE-clear, THROAT-clear & wnl. NECK:  Supple w/ fairROM; no JVD; normal carotid impulses w/o bruits; no thyromegaly or nodules palpated; no lymphadenopathy. CHEST:  Clear to P & A; without wheezes/ rales/ or rhonchi heard... HEART:  Regular Rhythm; without murmurs/ rubs/ or gallops. ABDOMEN:  Soft & nontender; normal bowel sounds; no organomegaly or masses detected. EXT: without deformities, mild arthritic changes; no varicose veins but  +venous insuffic & tr edema. NEURO:  CN's intact;  no focal neuro deficits... DERM:  No lesions noted; no rash etc...  RADIOLOGY DATA:  Reviewed in the EPIC EMR & discussed w/ the patient...  LABORATORY DATA:  Reviewed in the EPIC EMR & discussed w/ the patient...   Assessment & Plan:   RESP>  Hx AR, Asthma, old sarcoid, mild OSA>  All stable, breathing OK, but she notes some wheezing w/ exercise;  On Mucinex & asked to use her Xopenex prior to exercise which she didn't do; we could consider a leukotriene receptor modifier in the future if nec...  CARDS>  Followed by DrTTurner on meds listed> Hx HBP, CAD, VI, etc;  Doing satis & we reviewed exercise program etc...  CHOL>  Doing satis on the Lip + Zetia; good job w/ wt reduction, keep up the good work...  DM>  Followed by [name] on Metformin, Onglyzal; A1c improved to 6.7 in 2012;  rec continue meds & asked to have records sent to [name]...  GI>  Stable on Protonix as needed...  FM>  She states doing well...  Anxiety>  She remains on Alprazolam as needed; & we will increse the Lexapro to 20mg /d....   Patient's Medications  New Prescriptions   ESCITALOPRAM (LEXAPRO) 20 MG TABLET    Take 1 tablet (20 mg total) by mouth daily.   ONDANSETRON (ZOFRAN ODT) 4 MG DISINTEGRATING TABLET    Take 1 tablet (4 mg total) by mouth every 6 (six) hours as needed for nausea.  Previous Medications   ASPIRIN 81 MG TABLET    Take 81 mg by mouth daily.     ATORVASTATIN (LIPITOR) 40 MG TABLET    Take 40 mg by mouth daily.     COENZYME Q10 (CO Q 10) 100 MG CAPS    Take 2 capsules by mouth.     DILTIAZEM (CARDIZEM CD) 240 MG 24 HR CAPSULE    Take 240 mg by mouth daily.     ESTROGEN, CONJUGATED,-MEDROXYPROGESTERONE (PREMPRO) 0.3-1.5 MG PER TABLET    Take 1 tablet by mouth daily.   EZETIMIBE (ZETIA) 10 MG TABLET    Take 10 mg by mouth daily.     FUROSEMIDE (LASIX) 20  MG TABLET    Take 20 mg by mouth daily.     ISOSORBIDE MONONITRATE (IMDUR) 120 MG 24 HR TABLET     Take 120 mg by mouth daily.     MECLIZINE (ANTIVERT) 25 MG TABLET    Take 25 mg by mouth. 1/2 to 1 tab by mouth every 4 hours as needed for dizziness    METFORMIN (GLUCOPHAGE-XR) 500 MG 24 HR TABLET    take 1 tablet by mouth twice a day   METOPROLOL (TOPROL-XL) 50 MG 24 HR TABLET    1 1/2 once daily   PANTOPRAZOLE (PROTONIX) 40 MG TABLET    Take 1 tablet (40 mg total) by mouth daily. 30 minutes before the first meal of the day   RANOLAZINE (RANEXA) 500 MG 12 HR TABLET    Take 500 mg by mouth 2 (two) times daily. Per Dr. Mayford Knife   SAXAGLIPTIN HCL (ONGLYZA) 2.5 MG TABS TABLET    Take by mouth daily. 1 tab each morning   SODIUM CHLORIDE (OCEAN NASAL SPRAY) 0.65 % NASAL SPRAY    1 spray by Nasal route as needed.    Modified Medications   Modified Medication Previous Medication   ALPRAZOLAM (XANAX) 0.5 MG TABLET ALPRAZolam (XANAX) 0.5 MG tablet      Take 1/2 tab let by mouth every mid morning and 1/2 tablet every mid afternoon and 1 tablet at bedtime    Take 1/2 tab let by mouth every mid morning and 1/2 tablet every mid afternoon and 1 tablet at bedtime  Discontinued Medications   BROMOCRIPTINE (PARLODEL) 2.5 MG TABLET    take 1 tablet by mouth at bedtime   CHOLESTYRAMINE (QUESTRAN) 4 G PACKET    Take 1 packet by mouth daily as needed. Mix in water or juice as needed for diarrhea   DIPHENHYD-HYDROCORT-NYSTATIN SUSP    Gargle and swallow 4 times a day   DIPHENOXYLATE-ATROPINE (LOMOTIL) 2.5-0.025 MG PER TABLET    Take 1 tablet by mouth every 6 (six) hours as needed for diarrhea/loose stools.   ESCITALOPRAM (LEXAPRO) 10 MG TABLET    Take 1 tablet (10 mg total) by mouth daily.   GUAIFENESIN (MUCINEX) 600 MG 12 HR TABLET    Take 1,200 mg by mouth 2 (two) times daily.     LEVALBUTEROL (XOPENEX HFA) 45 MCG/ACT INHALER    Inhale 1-2 puffs into the lungs every 4 (four) hours as needed.     PREDNISONE (STERAPRED UNI-PAK) 5 MG TABS    6 day pack as directed

## 2012-06-05 ENCOUNTER — Ambulatory Visit: Payer: BC Managed Care – PPO | Admitting: Obstetrics and Gynecology

## 2012-06-06 ENCOUNTER — Ambulatory Visit: Payer: BC Managed Care – PPO | Admitting: Obstetrics and Gynecology

## 2012-06-06 ENCOUNTER — Encounter: Payer: Self-pay | Admitting: Obstetrics and Gynecology

## 2012-06-06 ENCOUNTER — Ambulatory Visit (INDEPENDENT_AMBULATORY_CARE_PROVIDER_SITE_OTHER): Payer: BC Managed Care – PPO | Admitting: Obstetrics and Gynecology

## 2012-06-06 VITALS — BP 120/80 | Ht 62.0 in | Wt 183.0 lb

## 2012-06-06 DIAGNOSIS — Z124 Encounter for screening for malignant neoplasm of cervix: Secondary | ICD-10-CM

## 2012-06-06 MED ORDER — VALACYCLOVIR HCL 500 MG PO TABS: 500.0000 mg | ORAL_TABLET | Freq: Two times a day (BID) | ORAL | Status: AC

## 2012-06-06 NOTE — Progress Notes (Signed)
Last Pap: 05/2011 WNL: Yes Regular Periods:no Contraception: n/a  Monthly Breast exam:yes Tetanus<57yrs:yes Nl.Bladder Function:yes Daily BMs:yes Healthy Diet:yes Calcium:no Mammogram:yes Date of Mammogram: 2012 wnl per pt Exercise:yes Have often Exercise: occ Seatbelt: yes Abuse at home: no Stressful work:no Sigmoid-colonoscopy: 6 yrs ago wnl per pt Bone Density: No PCP: Dr.Scott Nadel Change in PMH: no change Change in FMH:no change BP 120/80  Ht 5\' 2"  (1.575 m)  Wt 183 lb (83.008 kg)  BMI 33.47 kg/m2 Physical Examination: Neck - supple, no significant adenopathy Chest - clear to auscultation, no wheezes, rales or rhonchi, symmetric air entry Heart - normal rate, regular rhythm, normal S1, S2, no murmurs, rubs, clicks or gallops Abdomen - soft, nontender, nondistended, no masses or organomegaly Breasts - breasts appear normal, no suspicious masses, no skin or nipple changes or axillary nodes Pelvic - normal external genitalia, vulva, vagina, cervix, uterus and adnexa, atrophic.  Scant blood seen at the pts os Rectal - deferred per pt Musculoskeletal - no joint tenderness, deformity or swelling Extremities - peripheral pulses normal, no pedal edema, no clubbing or cyanosis Skin - normal coloration and turgor, no rashes, no suspicious skin lesions noted Normal AEX Pt due for mammogram yes.  It was scheduled colonoscopy due no  Pap done yes RT one year Diet and exercise discussed Pt will try to wean prempro.  If hot flashes return, she will re start it [name] @[place]  poss EMBX .  Spotting seen on exam today

## 2012-06-07 LAB — PAP IG W/ RFLX HPV ASCU

## 2012-06-15 ENCOUNTER — Telehealth: Payer: Self-pay

## 2012-06-15 NOTE — Telephone Encounter (Signed)
Spoke with pt rgd msg pt states need to r/s appt for 06/19/12 pt has appt with breast center for breast biopsy at the same time pt r/s appt 07/13/12 at 3:00 for u/s 3:45 with ND pt voice understanding pt also wanted to know if she should stop prempeo completely due to breast changes or continue advised pt will consult with ND and call her back pt voice understanding

## 2012-06-15 NOTE — Telephone Encounter (Signed)
Continue prempro until biopsy results are back or her next appointment

## 2012-06-16 NOTE — Telephone Encounter (Signed)
Spoke with pt rgd msg informed per ND continue taking Prempro until results of biopsy pt voice understanding

## 2012-06-19 ENCOUNTER — Encounter: Payer: BC Managed Care – PPO | Admitting: Obstetrics and Gynecology

## 2012-06-19 ENCOUNTER — Other Ambulatory Visit: Payer: BC Managed Care – PPO

## 2012-06-19 ENCOUNTER — Other Ambulatory Visit: Payer: Self-pay | Admitting: Radiology

## 2012-06-28 ENCOUNTER — Telehealth: Payer: Self-pay | Admitting: Obstetrics and Gynecology

## 2012-06-28 MED ORDER — VALACYCLOVIR HCL 500 MG PO TABS: 500.0000 mg | ORAL_TABLET | Freq: Every day | ORAL | Status: AC

## 2012-06-28 NOTE — Telephone Encounter (Signed)
Spoke with pt rgd msg pt states need refill on rx valtrex per protocol advised pt rx sent to pharm pt voice understanding

## 2012-06-28 NOTE — Telephone Encounter (Signed)
NICCOLE/RX QUEST.

## 2012-07-06 ENCOUNTER — Other Ambulatory Visit: Payer: BC Managed Care – PPO

## 2012-07-13 ENCOUNTER — Other Ambulatory Visit: Payer: BC Managed Care – PPO

## 2012-07-13 ENCOUNTER — Other Ambulatory Visit: Payer: Self-pay | Admitting: Obstetrics and Gynecology

## 2012-07-13 ENCOUNTER — Encounter: Payer: BC Managed Care – PPO | Admitting: Obstetrics and Gynecology

## 2012-07-13 DIAGNOSIS — N950 Postmenopausal bleeding: Secondary | ICD-10-CM

## 2012-08-17 ENCOUNTER — Other Ambulatory Visit: Payer: Self-pay | Admitting: Endocrinology

## 2012-08-17 NOTE — Telephone Encounter (Signed)
Pt stated that Dr. Kriste Basque was monitoring her meds and was going to get him to call in refill.

## 2012-09-12 ENCOUNTER — Other Ambulatory Visit: Payer: Self-pay | Admitting: Pulmonary Disease

## 2012-12-04 ENCOUNTER — Ambulatory Visit (INDEPENDENT_AMBULATORY_CARE_PROVIDER_SITE_OTHER)
Admission: RE | Admit: 2012-12-04 | Discharge: 2012-12-04 | Disposition: A | Discharge status: Home / Self Care | Payer: BC Managed Care – PPO | Source: Ambulatory Visit | Attending: Pulmonary Disease | Admitting: Pulmonary Disease

## 2012-12-04 ENCOUNTER — Ambulatory Visit (INDEPENDENT_AMBULATORY_CARE_PROVIDER_SITE_OTHER): Payer: BC Managed Care – PPO | Admitting: Pulmonary Disease

## 2012-12-04 ENCOUNTER — Encounter: Payer: Self-pay | Admitting: Pulmonary Disease

## 2012-12-04 VITALS — BP 128/80 | HR 64 | Temp 98.4°F | Ht 62.0 in | Wt 186.2 lb

## 2012-12-04 DIAGNOSIS — E119 Type 2 diabetes mellitus without complications: Secondary | ICD-10-CM

## 2012-12-04 DIAGNOSIS — I10 Essential (primary) hypertension: Secondary | ICD-10-CM

## 2012-12-04 DIAGNOSIS — G4733 Obstructive sleep apnea (adult) (pediatric): Secondary | ICD-10-CM

## 2012-12-04 DIAGNOSIS — N200 Calculus of kidney: Secondary | ICD-10-CM

## 2012-12-04 DIAGNOSIS — K219 Gastro-esophageal reflux disease without esophagitis: Secondary | ICD-10-CM

## 2012-12-04 DIAGNOSIS — F411 Generalized anxiety disorder: Secondary | ICD-10-CM

## 2012-12-04 DIAGNOSIS — I2510 Atherosclerotic heart disease of native coronary artery without angina pectoris: Secondary | ICD-10-CM

## 2012-12-04 DIAGNOSIS — F32A Depression, unspecified: Secondary | ICD-10-CM

## 2012-12-04 DIAGNOSIS — K589 Irritable bowel syndrome without diarrhea: Secondary | ICD-10-CM

## 2012-12-04 DIAGNOSIS — F329 Major depressive disorder, single episode, unspecified: Secondary | ICD-10-CM

## 2012-12-04 DIAGNOSIS — D869 Sarcoidosis, unspecified: Secondary | ICD-10-CM

## 2012-12-04 DIAGNOSIS — E7800 Pure hypercholesterolemia, unspecified: Secondary | ICD-10-CM

## 2012-12-04 DIAGNOSIS — E559 Vitamin D deficiency, unspecified: Secondary | ICD-10-CM

## 2012-12-04 DIAGNOSIS — IMO0001 Reserved for inherently not codable concepts without codable children: Secondary | ICD-10-CM

## 2012-12-04 DIAGNOSIS — I872 Venous insufficiency (chronic) (peripheral): Secondary | ICD-10-CM

## 2012-12-04 MED ORDER — TRAMADOL HCL 50 MG PO TABS: 50.0000 mg | ORAL_TABLET | Freq: Three times a day (TID) | ORAL | Status: DC | PRN

## 2012-12-04 MED ORDER — ESZOPICLONE 2 MG PO TABS: 2.0000 mg | ORAL_TABLET | Freq: Every day | ORAL | Status: DC

## 2012-12-04 NOTE — Patient Instructions (Addendum)
Today we updated your med list in our EPIC system...    Continue your current medications the same...  We decided to add LUNESTA 2mg  at bedtime to aide sleep & treat the Fibromyalgia... In addition we wrote for TRAMADOL 50mg  to take up to three times daily as needed for pain & inflammation...  Remember to add an OTC IRON tab (325mg /d) and an OTC Vit B12 tab (1000mg /d)  Today we did your follow up CXR to be sure the lungs remain clear...    We will call you w/ the results when avail...  For the Fibromyalgia> the rheumatologists tell us that a good night's sleep is imperative!!!    Also helpful is a regular low impact exercise program like yoga, tai chi, stretching, etc...  Call for any questions...  Let's plan a follow up visit in 3-4 months.Marland KitchenMarland Kitchen

## 2012-12-04 NOTE — Progress Notes (Addendum)
Subjective:    Patient ID: Jocelyn [name], female    DOB: [birth year], 57 y.o.   MRN: [id number]  HPI 57 y/o BF here for a follow up visit... she has multiple medical problems including:  AR & Asthma;  Hx Sarcoid;  Mild OSA;  HBP;  CAD;  Ven Insuffic;  Hyperchol;  DM;  GERD/ IBS;  Hx Kidney stones;  FM;  Vit D defic;  Anxiety...  ~  September 08, 2011:  23mo ROV & she has been reasonably stable, no new complaints or concerns;     AR/ AB/ Hx Sarcoid> on Xopenex Prn, Mucinex & MMW; denies cough, sputum, hemoptysis, ch in dyspnea, chest discomfort...    HBP/ CAD> Followed by DrTTurner on ASA81, Imdur120, ToprolXL75, Cardizem240, Lasix20; BP= 120/80, P= 78/reg; denies CP, palpit, syncope, SOB, edema; she reports check up 7/12 (we don't have note) & doing fine, no changes made...    CHOL> on Lip40, Zetia10, CoQ10; she has lost 9# on diet down to 186# today; FLP here 4/12 was wnl & reviewed w/ pt...    DM> Followed by DrEllison on Metform500Bid, Onglyza2.5, Parlodel2.5hs; A1c= 7.2 in July and repeated today at her request= 6.7; despite this good control she not happy w/ her care & requests second opinion from [facility] & we will refer...    GI> GERD, IBS> on Protonix40; denies abd pain, n/v, d/c/blood...    Anxiety> on Xanax0.5mg  Prn, she was INTOL to Zoloft...  ~  June 02, 2012:  77mo ROV & Jocelyn [name] says she is doing well w/o new complaints or concerns...    AR/ AB/ Hx Sarcoid> on Mucinex & MMW prn; denies cough, sputum, hemoptysis, ch in dyspnea, chest discomfort; she has SOB/DOE but is too sedentary & needs to incr exercise program...    HBP/ CAD> Followed by DrTTurner on ASA81, Imdur120, ToprolXL75, Cardizem240, Lasix20;  BP= 110/68 & denies CP, palpit, syncope, SOB, edema; she reports check up 5/13 (we don't have note) & they added RANEXA 500mg  Bid for her CP...    CHOL> on Lip40, Zetia10, CoQ10; she has lost 3# on diet down to 183# today; FLP is followed by DrTTurner per pt but we don't have  copies; pt reports concern for "particle size" & she was rec to enter into a drug trial...    DM> Now followed by [name] on Metform500Bid & Onglyza2.5; last A1c here= 6.7; we don't have notes from [name]; she reports doing ok...    GI> GERD, IBS> on Protonix40; denies abd pain, n/v, d/c/blood...    Anxiety> on Xanax0.5mg  Prn, she was INTOL to Zoloft & on Lexapro10mg  w/ rec to incr to 20mg  today... She saw DrDillard for GYN 5/13> mult menopausal symptoms & she was started on Prempro... LABS 7/13 by [name]> FLP- at goals on Lip+Zetia;  Chems- wnl;  CBC- Hg=12.4, Ferritin low at 14.9, B12= 211; TSH=1.53;   ~  December 04, 2012:  23mo ROV & Jocelyn [name] has mult somatic complaints and they all seem to revolve around not resting well & aching/ sore/ tender in chest wall; we reviewed FM diagnosis which she has carried for yrs and prev saw [name]- Rec trial Lunesta 2mg Qhs & Tramadol Tid prn... We reviewed the following medical problems during today's office visit >>      AR/ AB/ Hx Sarcoid> on Mucinex & MMW prn; denies cough, sputum, hemoptysis, ch in dyspnea- she has SOB/DOE but is too sedentary & needs to incr exercise program; her chest discomfort sounds like CWP from  her FM & we discussed FM rx below...    HBP/ CAD> on ASA81, Imdur120, ToprolXL75, Cardizem240, Lasix20prn (seldom takes); BP=128/80 & she has mult somatic c/o- CWP, tired/no energy, but denies palpit, ch in SOB, edema, etc; followed by [facility]- DrTurner/ [name] & seen recently but we don't have note; they prev added RANEXA 500mg  Bid for her CP (now off this med)...    CHOL> on Lip40, Zetia10, CoQ10; she has gained 4# on diet down to 186# today; FLP is followed by [facility] per pt- pt reports concern for "particle size" & she was rec to enter into a drug trial; last FLP 7/13 showed TChol 137, TG 89, HDL 42, LDL 73...    DM> followed by [name] on Metform500Bid & Onglyza2.5; last labs from [name] 9/13 showed BS=201, A1c=6.0; he rec same  meds...    GI> GERD, IBS> on Protonix40; denies abd pain, n/v, d/c/blood...    FM> This is her CC w/ not resting well, wakes tired, poor energy, aching/ sore/ tender (esp in chest in AM) & we discussed trial Lunesta 2mg Qhs, & Tramadol 50mg Tid; plus rest, heat, etc... States she's INTOL Ambien w/ weird dreams & Benedryl/ Melatonin w/o help...    Anxiety> on Xanax0.5mg  Prn, she was INTOL to Zoloft & on Lexapro10mg  w/ rec to incr to 20mg  today...    MISC> she tells me that [name] treated her low VitD w/ 50K weekly, then switched to daily OTC supplement; she says he wants me to address her other labs that he did 7/13- Ferritin sl low at 14.9 (Rec FeSO4 325mg /d), B12 borderline low at 211 (Rec OTC Vit B12 1000mg  daily) & we will f/u labs later...  We reviewed prob list, meds, xrays and labs> see below for updates >> LABS per [name], [facility]... CXR 1/14 showed cardiomeg, clear lungs, no adenopathy, mild scoliosis...         Problems List:  MENIERE'S DISEASE (ICD-3 - eval by ENT, DrBates- Rx w/ diuretic, low salt, Xanax vs Valium, & Antivert Prn... dizziness recurred & ENT sent her to [facility] 7/11- we don't have notes but pt reports that nothing was found.  ALLERGIC RHINITIS - increased allergy symptoms in the spring... rec> Claritin, Saline, Flonase...  Hx of ASTHMATIC BRONCHITIS, ACUTE  - requires occas Antibiotics and Medrol for infectious exac- last Oct09 & resolved w/ Avelox/ Depo/ Medrol... has not been on regular inhalers, but uses XOPENEX MDI (w/ spacer), MUCINEX & TUSSIONEX Prn... ~  essentially neg CTChest 9/08 after CXR suggested RULnodule. ~  CXR 10/09 showed cardiomegaly, clear lungs, min scoliosis, NAD. ~  CXR 9/11 showed cardiomeg, clear lungs, NAD.[name]. ~  CXR 1/14 showed cardiomeg, clear lungs, no adenopathy, mild scoliosis...  Hx of SARCOIDOSIS  - Dx'd 1980's w/ bronch bx... Rx Pred transiently and improved... no active disease x years... ~  baseline CXRs showed cardiomegaly,  clear lungs, min scoliosis, NAD.  Hx of Mild OBSTRUCTIVE SLEEP APNEA - sleep eval DrClance 2006 w/ RDI= 5 only.  HYPERTENSION, BENIGN  - on TOPROL XL 50mg - 1.5 tabs daily, CARDIZEM 240mg /d, LASIX 20mg  just as needed now... ~  prev OV's 130-140's / 80's... prev noted sl HA, chest tightness, sl SOB, & mult somatic complaints... Labs & renal- all WNL. ~  4/12:  BP=122/76 today, and similar at home per pt... She denies CP, palpit, SOB, edema, etc... Labs & renal- all WNL. ~  1/14: on ASA81, Imdur120, ToprolXL75, Cardizem240, Lasix20prn (seldom takes); BP=128/80 & she has mult somatic c/o- CWP,  tired/no energy, but denies palpit, ch in SOB, edema, etc; followed by [facility]- DrTurner/ [name] & seen recently but we don't have note; they prev added RANEXA 500mg  Bid for her CP (now off this med); we discussed trial Lunesta 2mg Qhs for sleep & Tramadol 50mg Tid for pain...  CORONARY ARTERY DISEASE - on ASA 81mg /d, IMDUR 120mg /d, Toprol XL, Cardizem, Lasix as above... Followed by DrTTurner. ~  Cath in 2000 showed non-obstructive CAD (20-30% lesions in all 3 vessels) w/ rec for risk factor modification.   ~  NuclearStressTest 4/07 showed no ischemia & EF=73%. ~  Mar10:  she's concerned about BP and chest tightness, requests Cardiac eval DrTurner & we will refer... ~  2DEcho 4/10 showed norm LVF w/ EF=55-60%, norm MV, norm AoV, Gr1DD ~  4/10 eval by DrTurner w/ MYOVIEW- neg- breast attenuation, no regional wall motion abn, EF= 75% ~  4/10 Cath by DrTurner w/ 90% small 2nd diag branch of LAD <too sm for PTCA> & luminal irreg up to 20% in RCA, +DD- Med Rx. ~  She had more chest tightness & another Myoview from DrTurner 8/12- it too was neg, no ischemia, no infarct; +breast attenuation; EF=70% & nno regional wall motion abnormalities;  IMDUR has been incr to 120mg /d. ~  1/14: she tells me [facility] to [facility] but we don't have notes from him> EKG sent showed NSR, rate72, wnl, NAD...  VENOUS  INSUFFICIENCY - she was referred to the [facility] foot clinic by DrWWoods and seen by DrSevier 6/08= ven insuffic Rx w/ TED's, no salt, elevation, etc. ~  10/10: notes bilat ankle ?lipoma in front of the lat malleoli- asymptomatic but several people have noticed them and she request refer to Ortho (rec DrBednarz when she is ready).  HYPERCHOLESTEROLEMIA  - on LIPITOR 40mg /d & ZETIA 10mg /d + CoQ10 per DrTurner... Diet & exercise stressed to the pt. ~  FLP here 12/07 on Lip10 showed TChol 156, TG 94, HDL 51, LDL 86 ~  FLP 1/09 on Lip10 showed TChol 157, TG 122, HDL 49, LDL 84 ~  FLP at [facility] study 5/09 on Lip10 showed TChol 176, TG 116, HDL 55, LDL 98 ~  FLP 3/10 on Lip10 showed TChol 170, TG 94, HDL 59, LDL 93 ~  FLP 6/10 by DrTurner on Lip20 showed TChol 134, TG 82, HDL 55, LDL 73 ~  FLP 4/11 here on Lip20+Zetia? showed TChol 145, TG 74, HDL 68, LDL 63 ~  9/11:  she reports that DrTurner incr Lipitor to 40mg , plus the Zetia 10mg , and she does her FLPs. ~  FLP here 4/12 on Lip40+Zetia showed TChol 153, TG 54, HDL 53, LDL 90... Copy sent to DrTurner ~  FLP by [name] 7/13 on Lip40+Zetia10 showed TChol 137, TG 89, HDL 42, LDL 73  DIABETES MELLITUS  - on Metformin 500mg Bid + PARLODEL 2.5mg /d per DrEllison... She is intol to Actos w/ edema, she stopped Januvia due to ?side effect, she wondered about Tajenta since her daugh works for [employer]. ~  labs 9/08 showed FBS=119, HgA1c=6.6 ~  labs 5/09 at [facility] study showed BS= 108, HgA1c= 6.5 ~  labs 3/10 showed BS= 113, A1c= 6.6 ~  labs 6/10 by DrTurner showed BS= 92; and 3/11 BS= 127 ~  labs 4/11 showed BS= 124, A1c= 7.6.[name]. rec> incr Metform to Bid; she had nutrition [facility] at [facility]... ~  6/11:  pt requested Endocrine consult & seen by DrEllison w/ Januvia 100mg /d added. ~  labs 9/11 showed BS= 98,  A1c= 7.3.Marland KitchenMarland Kitchen she stopped Januvia due to side effects & wonders about using Tajenta instead. ~  DrEllison started [employer] 2.5mg /d... She is now taking this  +Metform 500mg Bid... ~  Labs 4/12 (wt=195#) showed Bs= 116, A1c= 7.1 ~  Labs 7/12 showed A1c= 7.2;  Umicroalb= 3.5 ~  Labs 10/12 (wt=186#) showed A1c= 6.7 NOTE: She participated in a [facility] trial called the AfricanAmerican- Diabetes Heart Study in 2012; they sent a report indicating: 1) her memory was fine, but her MRI Brain showed sm vessel dis & mild sinus dis, otherw neg; 2) she may have treatable depression but we tried her on Zoloft & she was intol==> ch to Lexapro. ~  followed by [name] on Metform500Bid & Onglyza2.5; labs from [name] 9/13 showed BS=201, A1c=6.0; he rec same meds...  GERD SYMPTOMS - pt placed on PROTONIX 40mg /d w/ improvement in reflux symptoms...  IRRITABLE BOWEL SYNDROME  - colonoscopy 7/03 by [name] was WNL.  RENAL CALCULUS  - sm stone seen in L kidney on CTScan... she had lithotripsy by [facility] ~9/09.  FIBROMYALGIA  - prev Rx by [name] in 2006... ~  1/14: This is her CC w/ not resting well, wakes tired, poor energy, aching/ sore/ tender (esp in chest in AM) & we discussed trial Lunesta 2mg Qhs, & Tramadol 50mg Tid; plus rest, heat, etc... States she's INTOL Ambien w/ weird dreams & Benedryl/ Melatonin w/o help...  VITAMIN D DEFICIENCY - Vit D level was 30 at [facility] study in fall 2009... supplemented w/ OTC Vit D 1000 u daily. ~  Vit D level was lower from [name] & he treated w/ Vit D 50K weekly for awhile, then switched to OTC supplement...  ANXIETY - on ALPRAZOLAM 0.5mg  Prn... DEPRESSION - prev on Lexapro 20mg /d but she stopped on her own ~10/13 ?cost issues? ~  4/12: c/o feeling sad & anxious all the time- weak, not resting well, under a lot of stress; rec to seek counselling thru her local church or let us refer to [facility], & start RX w/ Zoloft 50==> but she was INTOL... ~  2013: switched to Lexapro & seems to be doing better but didn't stick w/ it due to cost issues...  HEALTH MAINTENANCE: ~  GI:  Followed by [name] & colon due 7/13... ~  GYN:  Followed by  DrDillard & she gets yearly PAP, Mammogram, hasn't had baseline BMD yet, started on [facility] 5/13... ~  Immunizations:  She gets the yearly Flu shots at school;  Had PNEUMOVAX previously; TDAP given 4/12...   No past surgical history on file - other than Lithotripsy for renal stone...   Outpatient Encounter Prescriptions as of 12/04/2012  Medication Sig Dispense Refill  . ALPRAZolam (XANAX) 0.5 MG tablet Take 1/2 tab let by mouth every mid morning and 1/2 tablet every mid afternoon and 1 tablet at bedtime  60 tablet  5  . aspirin 81 MG tablet Take 81 mg by mouth daily.        [name] atorvastatin (LIPITOR) 40 MG tablet Take 40 mg by mouth daily.        [name] diltiazem (CARDIZEM CD) 240 MG 24 hr capsule Take 240 mg by mouth daily.        [name] ezetimibe (ZETIA) 10 MG tablet Take 10 mg by mouth daily.        . furosemide (LASIX) 20 MG tablet Take 20 mg by mouth daily as needed.       . isosorbide mononitrate (IMDUR) 120 MG 24 hr tablet Take 120 mg  by mouth daily.        . meclizine (ANTIVERT) 25 MG tablet Take 25 mg by mouth. 1/2 to 1 tab by mouth every 4 hours as needed for dizziness       . metFORMIN (GLUCOPHAGE-XR) 500 MG 24 hr tablet take 1 tablet by mouth twice a day  60 tablet  2  . metoprolol (TOPROL-XL) 50 MG 24 hr tablet 1 1/2 once daily      . pantoprazole (PROTONIX) 40 MG tablet TAKE 1 TABLET BY MOUTH 30 MINUTES BEFORE THE FIRST MEAL OF THE DAY  30 tablet  5  . saxagliptin HCl (ONGLYZA) 2.5 MG TABS tablet Take by mouth daily. 1 tab each morning      . sodium chloride (OCEAN NASAL SPRAY) 0.65 % nasal spray 1 spray by Nasal route as needed.        [name] estrogen, conjugated,-medroxyprogesterone (PREMPRO) 0.3-1.5 MG per tablet Take 1 tablet by mouth daily.  30 tablet  11  . [DISCONTINUED] Coenzyme Q10 (CO Q 10) 100 MG CAPS Take 2 capsules by mouth.        . [DISCONTINUED] ranolazine (RANEXA) 500 MG 12 hr tablet Take 500 mg by mouth 2 (two) times daily. Per Dr. Mayford Knife        Allergies  Allergen  Reactions  . Codeine     REACTION: vomiting  . Morphine     REACTION: vomiting  . Pioglitazone     REACTION: pt states INTOL w/ edema  . Sitagliptin Phosphate     REACTION: nausea    Current Medications, Allergies, Past Medical History, Past Surgical History, Family History, and Social History were reviewed in [employer] record.   Review of Systems        See HPI - all other systems neg except as noted...      The patient complains of weight gain, dyspnea on exertion, and peripheral edema.  The patient denies anorexia, fever, weight loss, vision loss, decreased hearing, hoarseness, chest pain, syncope, prolonged cough, headaches, hemoptysis, abdominal pain, melena, hematochezia, severe indigestion/heartburn, hematuria, incontinence, genital sores, suspicious skin lesions, transient blindness, difficulty walking, depression, unusual weight change, abnormal bleeding, enlarged lymph nodes, and angioedema.     Objective:   Physical Exam     WD, Overweight, 56 y/o BF in NAD... GENERAL:  Alert & oriented; pleasant & cooperative. HEENT:  [place]/AT, EOM-wnl, PERRLA, EACs-clear, TMs-wnl, NOSE-clear, THROAT-clear & wnl. NECK:  Supple w/ fairROM; no JVD; normal carotid impulses w/o bruits; no thyromegaly or nodules palpated; no lymphadenopathy. CHEST:  Clear to P & A; without wheezes/ rales/ or rhonchi heard; sl tender on palpation of chest wall. HEART:  Regular Rhythm; without murmurs/ rubs/ or gallops. ABDOMEN:  Soft & nontender; normal bowel sounds; no organomegaly or masses detected. EXT: without deformities, mild arthritic changes; no varicose veins but +venous insuffic & tr edema; +trigger points. NEURO:  CN's intact;  no focal neuro deficits... DERM:  No lesions noted; no rash etc...  RADIOLOGY DATA:  Reviewed in the EPIC EMR & discussed w/ the patient...  LABORATORY DATA:  Reviewed in the EPIC EMR & discussed w/ the patient...   Assessment & Plan:    FM>   She has long standing FM which appears to have flaired up recently- not resting, no energy, aches & pains, etc; we discussed getting proper sleep- try Lunesta2mg  as she is intol to [place]; Add Tramadol50 prn pain...  RESP>  Hx AR, Asthma, old sarcoid, mild OSA>  All  stable, breathing OK, but she notes some wheezing w/ exercise;  On Mucinex & asked to use her Xopenex prior to exercise which she didn't do; we could consider a leukotriene receptor modifier in the future if nec... CXR remains stable/ NAD...  CARDS>  Followed by DrTTurner/ Eldridge Dace on meds listed> Hx HBP, CAD, VI, etc;  Chest pain is non-cardiac 7 likely related to her FM; Doing satis & we reviewed exercise program etc...  CHOL>  Doing satis on the Lip + Zetia; good job w/ wt reduction, keep up the good work...  DM>  Followed by [name] on Metformin, Onglyzal; A1c improved to 6.7 in 2012, then 6.0 in 2013;  rec continue meds & asked to have records sent to [name]...  GI>  Stable on Protonix as needed...  Anxiety>  She remains on Alprazolam as needed; & she stopped the Lexapro...   Patient's Medications  New Prescriptions   ESZOPICLONE (LUNESTA) 2 MG TABS    Take 1 tablet (2 mg total) by mouth at bedtime. Take immediately before bedtime   TRAMADOL (ULTRAM) 50 MG TABLET    Take 1 tablet (50 mg total) by mouth 3 (three) times daily as needed for pain.  Previous Medications   ALPRAZOLAM (XANAX) 0.5 MG TABLET    Take 1/2 tab let by mouth every mid morning and 1/2 tablet every mid afternoon and 1 tablet at bedtime   ASPIRIN 81 MG TABLET    Take 81 mg by mouth daily.     ATORVASTATIN (LIPITOR) 40 MG TABLET    Take 40 mg by mouth daily.     DILTIAZEM (CARDIZEM CD) 240 MG 24 HR CAPSULE    Take 240 mg by mouth daily.     ESTROGEN, CONJUGATED,-MEDROXYPROGESTERONE (PREMPRO) 0.3-1.5 MG PER TABLET    Take 1 tablet by mouth daily.   EZETIMIBE (ZETIA) 10 MG TABLET    Take 10 mg by mouth daily.     FERROUS SULFATE 325 (65 FE) MG TABLET    Take 325 mg  by mouth daily with breakfast.   FUROSEMIDE (LASIX) 20 MG TABLET    Take 20 mg by mouth daily as needed.    ISOSORBIDE MONONITRATE (IMDUR) 120 MG 24 HR TABLET    Take 120 mg by mouth daily.     MECLIZINE (ANTIVERT) 25 MG TABLET    Take 25 mg by mouth. 1/2 to 1 tab by mouth every 4 hours as needed for dizziness    METFORMIN (GLUCOPHAGE-XR) 500 MG 24 HR TABLET    take 1 tablet by mouth twice a day   METOPROLOL (TOPROL-XL) 50 MG 24 HR TABLET    1 1/2 once daily   PANTOPRAZOLE (PROTONIX) 40 MG TABLET    TAKE 1 TABLET BY MOUTH 30 MINUTES BEFORE THE FIRST MEAL OF THE DAY   SAXAGLIPTIN HCL (ONGLYZA) 2.5 MG TABS TABLET    Take by mouth daily. 1 tab each morning   SODIUM CHLORIDE (OCEAN NASAL SPRAY) 0.65 % NASAL SPRAY    1 spray by Nasal route as needed.     VITAMIN B-12 (CYANOCOBALAMIN) 1000 MCG TABLET    Take 1,000 mcg by mouth daily.  Modified Medications   No medications on file  Discontinued Medications   COENZYME Q10 (CO Q 10) 100 MG CAPS    Take 2 capsules by mouth.     RANOLAZINE (RANEXA) 500 MG 12 HR TABLET    Take 500 mg by mouth 2 (two) times daily. Per Dr. Mayford Knife

## 2012-12-05 ENCOUNTER — Telehealth: Payer: Self-pay | Admitting: Pulmonary Disease

## 2012-12-05 MED ORDER — TEMAZEPAM 15 MG PO CAPS: 15.0000 mg | ORAL_CAPSULE | Freq: Every day | ORAL | Status: DC

## 2012-12-05 NOTE — Telephone Encounter (Signed)
Per SN---there are no other generic options.   Can try temazepam 15 mg  1 qhs.  thanks

## 2012-12-05 NOTE — Telephone Encounter (Signed)
Spoke with patient informed her of Dr. Kirstie Mirza recs as listed below.  Patient would like to try this medication.  Per Dr. Kriste Basque I have called in Temazepam 15mg  tab #30 with 1 refill 1 tablet qhs.  Patient is aware and nothing further needed at this time.

## 2012-12-05 NOTE — Telephone Encounter (Signed)
Called and spoke with pt and she is aware of her cxr results.  Pt stated that the lunesta is not covered by her insurance and this will cost her $300 and the pharmacy told her she will need a generic medication.  SN please advise. thanks

## 2012-12-29 ENCOUNTER — Other Ambulatory Visit: Payer: Self-pay | Admitting: Pulmonary Disease

## 2013-02-19 ENCOUNTER — Telehealth: Payer: Self-pay | Admitting: Pulmonary Disease

## 2013-02-19 MED ORDER — PANTOPRAZOLE SODIUM 40 MG PO TBEC: 40.0000 mg | DELAYED_RELEASE_TABLET | Freq: Every day | ORAL | Status: DC

## 2013-02-19 NOTE — Telephone Encounter (Signed)
Spoke with patient, patient requesting refill for pantoprazole be called into verified pharmacy. Rx sent and nothing further needed at this time.

## 2013-03-01 ENCOUNTER — Telehealth: Payer: Self-pay | Admitting: Pulmonary Disease

## 2013-03-01 NOTE — Telephone Encounter (Signed)
Called and spoke with the pharmacy---they are aware that we are still waiting for the PA for the protonix.  Pharmacy has tried to run through omeprazole and all other medications but all need PA.  Will call later today to check on the PA.

## 2013-03-01 NOTE — Telephone Encounter (Signed)
Called and spoke with pt and she will call me back on Monday to discuss getting this medication from [facility].  She can get a 6 month supply for 37.00  Will hold message until then.

## 2013-03-05 ENCOUNTER — Telehealth: Payer: Self-pay | Admitting: Pulmonary Disease

## 2013-03-05 MED ORDER — PANTOPRAZOLE SODIUM 40 MG PO TBEC: 40.0000 mg | DELAYED_RELEASE_TABLET | Freq: Every day | ORAL | Status: DC

## 2013-03-05 NOTE — Telephone Encounter (Signed)
Duplicate message.  See other phone note that is open.

## 2013-03-05 NOTE — Telephone Encounter (Signed)
ATC call patient on home and mobile numbers. On both contact numbers received msg that "mailboxes are full and are unable to leave msgs" I have entered a call back number on patients mobile number. Will await return call

## 2013-03-05 NOTE — Telephone Encounter (Signed)
Pt called back again re: same. Jocelyn [name]  °

## 2013-03-05 NOTE — Telephone Encounter (Signed)
Spoke with patient, states she will use [facility] for protonix 6 month supply for 37$ I have sent Rx in and also given patient [facility] telephone number. [phone]) Nothing further needed at this time

## 2013-04-23 ENCOUNTER — Telehealth: Payer: Self-pay | Admitting: Pulmonary Disease

## 2013-04-23 MED ORDER — AMOXICILLIN-POT CLAVULANATE 875-125 MG PO TABS: 1.0000 | ORAL_TABLET | Freq: Two times a day (BID) | ORAL | Status: DC

## 2013-04-23 MED ORDER — METHYLPREDNISOLONE 4 MG PO KIT: PACK | ORAL | Status: DC

## 2013-04-23 NOTE — Telephone Encounter (Signed)
Called, spoke with pt.  C/o sneezing, a little sore throat head/sinus congestion with yellow mucus, bilateral ear pain, and a little wheezing since last Thursday.  Over the weekend, had fever up to 100 and chills with sweats.  No PND< increased SOB, chest pain.  Using mucinex, saline nasal spray, salt water gargles, and honey, lemon tea.  Requesting further recs.  Dr. Kriste Basque, pls advise.  Thank you.  Last OV with SN: 12/04/12; asked to f/u in 3-4 months. No pending OV  [facility] [address]  Allergies verified with pt: Allergies  Allergen Reactions  . Codeine     REACTION: vomiting  . Morphine     REACTION: vomiting  . Pioglitazone     REACTION: pt states INTOL w/ edema  . Sitagliptin Phosphate     REACTION: nausea

## 2013-04-23 NOTE — Telephone Encounter (Signed)
Per SN--  augmentin 875 mg  #14  1 po bid Align once daily Medrol dosepak  #1  Take as directed.  Called and spoke with pt and she is aware of SN recs.  Nothing further is needed.

## 2013-06-01 ENCOUNTER — Telehealth: Payer: Self-pay | Admitting: Pulmonary Disease

## 2013-06-01 NOTE — Telephone Encounter (Signed)
I spoke with the pt and she states [facility] ws supposed to fax over SCAN and lab reports for SN to review. Have you received these? The pt states she has copies and can bring them by if not received. Please advise. Carron Curie, CMA

## 2013-06-06 NOTE — Telephone Encounter (Signed)
I spoke with the pt and she is going to fax her copies to triage fax. I will pass these onto Leigh once received. Carron Curie, CMA

## 2013-06-06 NOTE — Telephone Encounter (Signed)
i have not seen anything come through on this pt. If she can bring by these copies and we can have them scanned into her chart. thanks

## 2013-06-06 NOTE — Telephone Encounter (Signed)
ATC her home number- NA and mailbox was full  Called cell number and also NA and unable to leave [employer] work number and was advised pt not employed there so I deleted this  [employer]

## 2013-06-07 NOTE — Telephone Encounter (Signed)
Called and spoke with pt and she is aware of SN recs from her reports that she faxed over.  appt has been scheduled for pt to see SN on 8-19.  Pt to call back if this does not work for her. Forms have been placed in the scan folder

## 2013-06-07 NOTE — Telephone Encounter (Signed)
Fax received and given to [facility]

## 2013-06-13 ENCOUNTER — Encounter: Payer: Self-pay | Admitting: Infectious Disease

## 2013-07-03 ENCOUNTER — Ambulatory Visit: Payer: BC Managed Care – PPO | Admitting: Pulmonary Disease

## 2013-08-01 ENCOUNTER — Encounter: Payer: Self-pay | Admitting: Pulmonary Disease

## 2013-08-08 ENCOUNTER — Ambulatory Visit (INDEPENDENT_AMBULATORY_CARE_PROVIDER_SITE_OTHER): Payer: BC Managed Care – PPO | Admitting: Pulmonary Disease

## 2013-08-08 ENCOUNTER — Encounter: Payer: Self-pay | Admitting: Pulmonary Disease

## 2013-08-08 ENCOUNTER — Other Ambulatory Visit (INDEPENDENT_AMBULATORY_CARE_PROVIDER_SITE_OTHER): Payer: BC Managed Care – PPO

## 2013-08-08 VITALS — BP 132/74 | HR 66 | Temp 98.5°F | Ht 62.0 in | Wt 190.4 lb

## 2013-08-08 DIAGNOSIS — E559 Vitamin D deficiency, unspecified: Secondary | ICD-10-CM

## 2013-08-08 DIAGNOSIS — E119 Type 2 diabetes mellitus without complications: Secondary | ICD-10-CM

## 2013-08-08 DIAGNOSIS — I872 Venous insufficiency (chronic) (peripheral): Secondary | ICD-10-CM

## 2013-08-08 DIAGNOSIS — I10 Essential (primary) hypertension: Secondary | ICD-10-CM

## 2013-08-08 DIAGNOSIS — G4733 Obstructive sleep apnea (adult) (pediatric): Secondary | ICD-10-CM

## 2013-08-08 DIAGNOSIS — F411 Generalized anxiety disorder: Secondary | ICD-10-CM

## 2013-08-08 DIAGNOSIS — E7800 Pure hypercholesterolemia, unspecified: Secondary | ICD-10-CM

## 2013-08-08 DIAGNOSIS — I2510 Atherosclerotic heart disease of native coronary artery without angina pectoris: Secondary | ICD-10-CM

## 2013-08-08 DIAGNOSIS — D509 Iron deficiency anemia, unspecified: Secondary | ICD-10-CM

## 2013-08-08 DIAGNOSIS — D869 Sarcoidosis, unspecified: Secondary | ICD-10-CM

## 2013-08-08 DIAGNOSIS — K589 Irritable bowel syndrome without diarrhea: Secondary | ICD-10-CM

## 2013-08-08 DIAGNOSIS — E538 Deficiency of other specified B group vitamins: Secondary | ICD-10-CM

## 2013-08-08 DIAGNOSIS — J069 Acute upper respiratory infection, unspecified: Secondary | ICD-10-CM

## 2013-08-08 DIAGNOSIS — IMO0001 Reserved for inherently not codable concepts without codable children: Secondary | ICD-10-CM

## 2013-08-08 DIAGNOSIS — K219 Gastro-esophageal reflux disease without esophagitis: Secondary | ICD-10-CM

## 2013-08-08 DIAGNOSIS — N200 Calculus of kidney: Secondary | ICD-10-CM

## 2013-08-08 LAB — CBC WITH DIFFERENTIAL/PLATELET
Basophils Absolute: 0 10*3/uL (ref 0.0–0.1)
Eosinophils Absolute: 0.2 10*3/uL (ref 0.0–0.7)
Eosinophils Relative: 2.2 % (ref 0.0–5.0)
Hemoglobin: 12.5 g/dL (ref 12.0–15.0)
Lymphocytes Relative: 23.8 % (ref 12.0–46.0)
Lymphs Abs: 1.9 10*3/uL (ref 0.7–4.0)
MCHC: 33.3 g/dL (ref 30.0–36.0)
Monocytes Absolute: 0.7 10*3/uL (ref 0.1–1.0)
Monocytes Relative: 9.4 % (ref 3.0–12.0)
Neutrophils Relative %: 64 % (ref 43.0–77.0)
Platelets: 306 10*3/uL (ref 150.0–400.0)
RDW: 15.1 % — ABNORMAL HIGH (ref 11.5–14.6)
WBC: 7.8 10*3/uL (ref 4.5–10.5)

## 2013-08-08 MED ORDER — ALPRAZOLAM 0.5 MG PO TABS: ORAL_TABLET | ORAL | Status: DC

## 2013-08-08 MED ORDER — PANTOPRAZOLE SODIUM 40 MG PO TBEC: 40.0000 mg | DELAYED_RELEASE_TABLET | Freq: Every day | ORAL | Status: DC

## 2013-08-08 MED ORDER — METHYLPREDNISOLONE ACETATE 80 MG/ML IJ SUSP
80.0000 mg | Freq: Once | INTRAMUSCULAR | Status: AC
  Administered 2013-08-08: 80 mg via INTRAMUSCULAR

## 2013-08-08 MED ORDER — AZITHROMYCIN 250 MG PO TABS: ORAL_TABLET | ORAL | Status: DC

## 2013-08-08 NOTE — Progress Notes (Signed)
Subjective:    Patient ID: Jocelyn [name], female    DOB: [birth year], 57 y.o.   MRN: [id number]  HPI 57 y/o BF here for a follow up visit... she has multiple medical problems including:  AR & Asthma;  Hx Sarcoid;  Mild OSA;  HBP;  CAD;  Ven Insuffic;  Hyperchol;  DM;  GERD/ IBS;  Hx Kidney stones;  FM;  Vit D defic;  Anxiety...  ~  September 08, 2011:  32mo ROV & she has been reasonably stable, no new complaints or concerns;     AR/ AB/ Hx Sarcoid> on Xopenex Prn, Mucinex & MMW; denies cough, sputum, hemoptysis, ch in dyspnea, chest discomfort...    HBP/ CAD> Followed by DrTTurner on ASA81, Imdur120, ToprolXL75, Cardizem240, Lasix20; BP= 120/80, P= 78/reg; denies CP, palpit, syncope, SOB, edema; she reports check up 7/12 (we don't have note) & doing fine, no changes made...    CHOL> on Lip40, Zetia10, CoQ10; she has lost 9# on diet down to 186# today; FLP here 4/12 was wnl & reviewed w/ pt...    DM> Followed by DrEllison on Metform500Bid, Onglyza2.5, Parlodel2.5hs; A1c= 7.2 in July and repeated today at her request= 6.7; despite this good control she not happy w/ her care & requests second opinion from [facility] & we will refer...    GI> GERD, IBS> on Protonix40; denies abd pain, n/v, d/c/blood...    Anxiety> on Xanax0.5mg  Prn, she was INTOL to Zoloft...  ~  June 02, 2012:  38mo ROV & Jocelyn [name] says she is doing well w/o new complaints or concerns...    AR/ AB/ Hx Sarcoid> on Mucinex & MMW prn; denies cough, sputum, hemoptysis, ch in dyspnea, chest discomfort; she has SOB/DOE but is too sedentary & needs to incr exercise program...    HBP/ CAD> Followed by DrTTurner on ASA81, Imdur120, ToprolXL75, Cardizem240, Lasix20;  BP= 110/68 & denies CP, palpit, syncope, SOB, edema; she reports check up 5/13 (we don't have note) & they added RANEXA 500mg  Bid for her CP...    CHOL> on Lip40, Zetia10, CoQ10; she has lost 3# on diet down to 183# today; FLP is followed by DrTTurner per pt but we don't have  copies; pt reports concern for "particle size" & she was rec to enter into a drug trial...    DM> Now followed by [name] on Metform500Bid & Onglyza2.5; last A1c here= 6.7; we don't have notes from [name]; she reports doing ok...    GI> GERD, IBS> on Protonix40; denies abd pain, n/v, d/c/blood...    Anxiety> on Xanax0.5mg  Prn, she was INTOL to Zoloft & on Lexapro10mg  w/ rec to incr to 20mg  today... She saw DrDillard for GYN 5/13> mult menopausal symptoms & she was started on Prempro... LABS 7/13 by [name]> FLP- at goals on Lip+Zetia;  Chems- wnl;  CBC- Hg=12.4, Ferritin low at 14.9, B12= 211; TSH=1.53;   ~  December 04, 2012:  32mo ROV & Jocelyn [name] has mult somatic complaints and they all seem to revolve around not resting well & aching/ sore/ tender in chest wall; we reviewed FM diagnosis which she has carried for yrs and prev saw [name]- Rec trial Lunesta 2mg Qhs & Tramadol Tid prn... We reviewed the following medical problems during today's office visit >>     AR/ AB/ Hx Sarcoid> on Mucinex & MMW prn; denies cough, sputum, hemoptysis, ch in dyspnea- she has SOB/DOE but is too sedentary & needs to incr exercise program; her chest discomfort sounds like CWP from her  FM & we discussed FM rx below...    HBP/ CAD> on ASA81, Imdur120, ToprolXL75, Cardizem240, Lasix20prn (seldom takes); BP=128/80 & she has mult somatic c/o- CWP, tired/no energy, but denies palpit, ch in SOB, edema, etc; followed by [facility]- DrTurner/ [name] & seen recently but we don't have note; they prev added RANEXA 500mg  Bid for her CP (now off this med)...    CHOL> on Lip40, Zetia10, CoQ10; she has gained 4# on diet down to 186# today; FLP is followed by [facility] per pt- pt reports concern for "particle size" & she was rec to enter into a drug trial; last FLP 7/13 showed TChol 137, TG 89, HDL 42, LDL 73...    DM> followed by [name] on Metform500Bid & Onglyza2.5; last labs from [name] 9/13 showed BS=201, A1c=6.0; he rec same  meds...    GI> GERD, IBS> on Protonix40; denies abd pain, n/v, d/c/blood...    FM> This is her CC w/ not resting well, wakes tired, poor energy, aching/ sore/ tender (esp in chest in AM) & we discussed trial Lunesta 2mg Qhs, & Tramadol 50mg Tid; plus rest, heat, etc... States she's INTOL Ambien w/ weird dreams & Benedryl/ Melatonin w/o help...    Anxiety> on Xanax0.5mg  Prn, she was INTOL to Zoloft & on Lexapro10mg  w/ rec to incr to 20mg  today...    MISC> she tells me that [name] treated her low VitD w/ 50K weekly, then switched to daily OTC supplement; she says he wants me to address her other labs that he did 7/13- Ferritin sl low at 14.9 (Rec FeSO4 325mg /d), B12 borderline low at 211 (Rec OTC Vit B12 1000mg  daily) & we will f/u labs later... We reviewed prob list, meds, xrays and labs> see below for updates >> LABS per [name], [facility]... CXR 1/14 showed cardiomeg, clear lungs, no adenopathy, mild scoliosis...   ~  August 08, 2013:  60mo ROV & Jocelyn [name] has several issues to discuss> 1) c/o sinus congestion- unresponsive to her OTC meds & rec to use Nasal saline mist, Mucinex 1200mg Bid, Fluids, and we will treat w/ ZPak & Depo80;  2) told by [name] that Iron was low & needed f/u here but we don't have these labs from him- repeat here today shows Hg=12.5, MCV=88, Fe=47 (12%sat), Ferritin=10.8; she is due for f/u colon w/ [name] & we will refer, rec to start FeSO4 325mg  Bid w/ VitC500 w/ plans for f/u CBC, Fe studies in 2-27mo;  She continues to f/u w/ [name] regularly- We reviewed the following medical problems during today's office visit >>     AR/ AB/ Hx Sarcoid> on Mucinex & MMW prn; denies cough, sputum, hemoptysis, or ch in dyspnea- she has SOB/DOE but is too sedentary & needs to incr exercise program; some CWP from her FM & we discussed FM rx below...    HBP/ CAD> on ASA81, Imdur120, Toprol50-1/2Bid, Cardizem240, Lasix20prn (seldom takes) & ?Amlodip5 from [employer]?; BP=132/74 & she has mult  somatic c/o- CWP, tired/no energy, but denies palpit, ch in SOB, edema, etc; followed by [facility]- DrTurner/ [name], no recent notes.    CHOL> on Lip40 => notes from [facility] says ?Lip80 +[facility] study drug?; she has gained 4# to 190# today; FLP is followed by [facility] per pt & she reports concern for "particle size" & last avail FLP was 7/14 by [name] w/ TChol 190, HDL 100, LDL 40    DM> followed by [name] on Metform500Bid & Onglyza2.5 => ?he changed to Kombiglyze5-1000 daily?; last labs from  [name] 7/14 showed BS=105, A1c=6.5; and she remains on the same meds...    GI> GERD, IBS> on Protonix40; denies abd pain, n/v, d/c, or blood seen; last colonoscopy was 7/03 by [name] and reported wnl; f/u due now esp in light of her low Ferritin level...    FM> This is her CC w/ not resting well, wakes tired, poor energy, aching/ sore/ tender (esp in chest in AM) & we discussed the importance of a good night's rest, plus Tramadol 50mg Tid; States she's INTOL Ambien w/ weird dreams & Benedryl/ Melatonin w/o help...    Anxiety> on Xanax0.5mg  Prn, she was INTOL to Zoloft & she stopped prev Lexapro Rx...    Vit D defic> she tells me that [name] treated her low VitD w/ 50K weekly, then switched to daily OTC supplement...    Vit B12 defic> [name] has her on B12 shots (weekly x4, then monthly) for B12 level done 8/14 = 154...    Borderline anemia & low Ferritin> pt reports that labs from [name] showed this & he wanted [name] to eval & treat; Labs 9/14 showed Hg= 12.5, MCV=88, Fe=47 (12%sat), Ferritin=10.8; she is due for f/u colon w/ [name] & we will refer, rec to start FeSO4 325mg  Bid w/ VitC500 w/ plans for f/u CBC, Fe studies in 2-35mo... We reviewed prob list, meds, xrays and labs> see below for updates >> SHE DID NOT BRING MED BOTTLES OR CURRENT MED LIST TO THE OV TODAY!!! She is not clear on wat meds have been changed by [name] Antony Haste...         Problems List:  MENIERE'S DISEASE (ICD-3 - eval by ENT,  DrBates- Rx w/ diuretic, low salt, Xanax vs Valium, & Antivert Prn... dizziness recurred & ENT sent her to [facility] 7/11- we don't have notes but pt reports that nothing was found.  ALLERGIC RHINITIS - increased allergy symptoms in the spring... rec> Claritin, Saline, Flonase...  Hx of ASTHMATIC BRONCHITIS, ACUTE  - requires occas Antibiotics and Medrol for infectious exac- last Oct09 & resolved w/ Avelox/ Depo/ Medrol... has not been on regular inhalers, but uses XOPENEX MDI (w/ spacer), MUCINEX & TUSSIONEX Prn... ~  essentially neg CTChest 9/08 after CXR suggested RULnodule. ~  CXR 10/09 showed cardiomegaly, clear lungs, min scoliosis, NAD. ~  CXR 9/11 showed cardiomeg, clear lungs, NAD.[name]. ~  CXR 1/14 showed cardiomeg, clear lungs, no adenopathy, mild scoliosis...  Hx of SARCOIDOSIS  - Dx'd 1980's w/ bronch bx... Rx Pred transiently and improved... no active disease x years... ~  baseline CXRs showed cardiomegaly, clear lungs, min scoliosis, NAD.  Hx of Mild OBSTRUCTIVE SLEEP APNEA - sleep eval DrClance 2006 w/ RDI= 5 only.  HYPERTENSION, BENIGN  - on TOPROL XL 50mg - 1.5 tabs daily, CARDIZEM 240mg /d, LASIX 20mg  just as needed now... ~  prev OV's 130-140's / 80's... prev noted sl HA, chest tightness, sl SOB, & mult somatic complaints... Labs & renal- all WNL. ~  4/12:  BP=122/76 today, and similar at home per pt... She denies CP, palpit, SOB, edema, etc... Labs & renal- all WNL. ~  1/14: on ASA81, Imdur120, ToprolXL75, Cardizem240, Lasix20prn (seldom takes); BP=128/80 & she has mult somatic c/o- CWP, tired/no energy, but denies palpit, ch in SOB, edema, etc; followed by [facility]- DrTurner/ [name] & seen recently but we don't have note; they prev added RANEXA 500mg  Bid for her CP (now off this med); we discussed trial Lunesta 2mg Qhs for sleep & Tramadol 50mg Tid for pain... ~  9/14:  on  ASA81, Imdur120, Toprol50-1/2Bid, Cardizem240, Lasix20prn (seldom takes); BP=132/74 & she has mult somatic  c/o- CWP, tired/no energy, but denies palpit, ch in SOB, edema, etc; followed by [facility]- DrTurner/ [name], no recent notes  CORONARY ARTERY DISEASE - on ASA 81mg /d, IMDUR 120mg /d, Toprol XL, Cardizem, Lasix as above... Followed by DrTTurner. ~  Cath in 2000 showed non-obstructive CAD (20-30% lesions in all 3 vessels) w/ rec for risk factor modification.   ~  NuclearStressTest 4/07 showed no ischemia & EF=73%. ~  Mar10:  she's concerned about BP and chest tightness, requests Cardiac eval DrTurner & we will refer... ~  2DEcho 4/10 showed norm LVF w/ EF=55-60%, norm MV, norm AoV, Gr1DD ~  4/10 eval by DrTurner w/ MYOVIEW- neg- breast attenuation, no regional wall motion abn, EF= 75% ~  4/10 Cath by DrTurner w/ 90% small 2nd diag branch of LAD <too sm for PTCA> & luminal irreg up to 20% in RCA, +DD- Med Rx. ~  She had more chest tightness & another Myoview from DrTurner 8/12- it too was neg, no ischemia, no infarct; +breast attenuation; EF=70% & nno regional wall motion abnormalities;  IMDUR has been incr to 120mg /d. ~  1/14: she tells me [facility] switched [facility] to [facility] but we don't have notes from him> EKG sent showed NSR, rate72, wnl, NAD... ~  EKG 5/14 showed NSR, rate 93, WNL, NAD...  VENOUS INSUFFICIENCY - she was referred to the [facility] foot clinic by DrWWoods and seen by DrSevier 6/08= ven insuffic Rx w/ TED's, no salt, elevation, etc. ~  10/10: notes bilat ankle ?lipoma in front of the lat malleoli- asymptomatic but several people have noticed them and she request refer to Ortho (rec DrBednarz when she is ready).  HYPERCHOLESTEROLEMIA  - on LIPITOR 40mg /d & ZETIA 10mg /d + CoQ10 per DrTurner... Diet & exercise stressed to the pt. ~  FLP here 12/07 on Lip10 showed TChol 156, TG 94, HDL 51, LDL 86 ~  FLP 1/09 on Lip10 showed TChol 157, TG 122, HDL 49, LDL 84 ~  FLP at [facility] study 5/09 on Lip10 showed TChol 176, TG 116, HDL 55, LDL 98 ~  FLP 3/10 on Lip10 showed TChol 170, TG 94, HDL 59, LDL  93 ~  FLP 6/10 by DrTurner on Lip20 showed TChol 134, TG 82, HDL 55, LDL 73 ~  FLP 4/11 here on Lip20+Zetia? showed TChol 145, TG 74, HDL 68, LDL 63 ~  9/11:  she reports that DrTurner incr Lipitor to 40mg , plus the Zetia 10mg , and she does her FLPs. ~  FLP here 4/12 on Lip40+Zetia showed TChol 153, TG 54, HDL 53, LDL 90... Copy sent to DrTurner ~  FLP by [name] 7/13 on Lip40+Zetia10 showed TChol 137, TG 89, HDL 42, LDL 73 ~  9/14: on Lip40 => notes from [place] says ?Lip80 +[facility] study drug?; she has gained 4# to 190# today; FLP is followed by [facility] per pt & she reports concern for "particle size" & last avail FLP was 7/14 by [name] w/ TChol 190, HDL 100, LDL 40  DIABETES MELLITUS  - on Metformin 500mg Bid + PARLODEL 2.5mg /d per DrEllison... She is intol to Actos w/ edema, she stopped Januvia due to ?side effect, she wondered about Tajenta since her daugh works for [employer]. ~  labs 9/08 showed FBS=119, HgA1c=6.6 ~  labs 5/09 at [facility] study showed BS= 108, HgA1c= 6.5 ~  labs 3/10 showed BS= 113, A1c= 6.6 ~  labs 6/10 by DrTurner showed BS= 92; and  3/11 BS= 127 ~  labs 4/11 showed BS= 124, A1c= 7.6.[name]. rec> incr Metform to Bid; she [facility] at [facility]... ~  6/11:  pt requested Endocrine consult & seen by DrEllison w/ Januvia 100mg /d added. ~  labs 9/11 showed BS= 98, A1c= 7.3.Marland KitchenMarland Kitchen she stopped Januvia due to side effects & wonders about using Tajenta instead. ~  DrEllison started [employer] 2.5mg /d... She is now taking this +Metform 500mg Bid... ~  Labs 4/12 (wt=195#) showed Bs= 116, A1c= 7.1 ~  Labs 7/12 showed A1c= 7.2;  Umicroalb= 3.5 ~  Labs 10/12 (wt=186#) showed A1c= 6.7 NOTE: She participated in a [facility] trial called the AfricanAmerican- Diabetes Heart Study in 2012; they sent a report indicating: 1) her memory was fine, but her MRI Brain showed sm vessel dis & mild sinus dis, otherw neg; 2) she may have treatable depression but we tried her on Zoloft & she was intol==>  ch to Lexapro. ~  followed by [name] on Metform500Bid & Onglyza2.5; labs from [name] 9/13 showed BS=201, A1c=6.0; he rec same meds... ~  9/14: followed by [name] on Metform500Bid & Onglyza2.5 => ?he changed to Kombiglyze5-1000 daily?; last labs from [name] 7/14 showed BS=105, A1c=6.5; and she remains on the same meds...  GERD SYMPTOMS - pt placed on PROTONIX 40mg /d w/ improvement in reflux symptoms...  IRRITABLE BOWEL SYNDROME  - colonoscopy 7/03 by [name] was WNL.  RENAL CALCULUS  - sm stone seen in L kidney on CTScan... she had lithotripsy by [facility] ~9/09.  FIBROMYALGIA  - prev Rx by [name] in 2006... ~  1/14: This is her CC w/ not resting well, wakes tired, poor energy, aching/ sore/ tender (esp in chest in AM) & we discussed trial Lunesta 2mg Qhs, & Tramadol 50mg Tid; plus rest, heat, etc... States she's INTOL Ambien w/ weird dreams & Benedryl/ Melatonin w/o help...  VITAMIN D DEFICIENCY - Vit D level was 30 at [facility] Dt study in fall 2009... supplemented w/ OTC Vit D 1000 u daily. ~  Vit D level was lower from [name] & he treated w/ Vit D 50K weekly for awhile, then switched to OTC supplement...  ANXIETY - on ALPRAZOLAM 0.5mg  Prn... DEPRESSION - prev on Lexapro 20mg /d but she stopped on her own ~10/13 ?cost issues? ~  4/12: c/o feeling sad & anxious all the time- weak, not resting well, under a lot of stress; rec to seek counselling thru her local church or let us refer to [facility] & start RX w/ Zoloft 50==> but she was INTOL... ~  2013: switched to Lexapro & seems to be doing better but didn't stick w/ it due to cost issues...  ANEMIA & LOW FERRITIN LEVEL >>  ~  9/14: she tells me that Ferritin is low & [name] tried her on Oral Fe prep w/o improvement & wants me to f/u and treat this problem; we discussed need to recheck labs here- LABS 9/14 showed Hg=12.5, MCV=88, Fe=47 (12%sat), Ferritin=10.8; she is due for f/u colon w/ [name] & we will refer, rec to start FeSO4 325mg  Bid w/  VitC500 w/ plans for f/u CBC, Fe studies in 2-80mo;  HEALTH MAINTENANCE: ~  GI:  Followed by [name] & colon due 7/13... ~  GYN:  Followed by DrDillard & she gets yearly PAP, Mammogram, hasn't had baseline BMD yet, started on [facility] 5/13... ~  Immunizations:  She gets the yearly Flu shots at school;  Had PNEUMOVAX previously; TDAP given 4/12...   No past surgical history on file - other than Lithotripsy  for renal stone...   Outpatient Encounter Prescriptions as of 08/08/2013  Medication Sig Dispense Refill  . ALPRAZolam (XANAX) 0.5 MG tablet take 1/2 tablet by mouth MID MORNING AND MID AFTERNOON, and 1 tablet by mouth at bedtime  60 tablet  5  . aspirin 81 MG tablet Take 81 mg by mouth daily.        [name] atorvastatin (LIPITOR) 40 MG tablet Take 40 mg by mouth daily.        . Cyanocobalamin 1000 MCG/ML KIT Inject 1,000 mg as directed every 30 (thirty) days.      [name] diltiazem (CARDIZEM CD) 240 MG 24 hr capsule Take 240 mg by mouth daily.        [name] estrogen, conjugated,-medroxyprogesterone (PREMPRO) 0.3-1.5 MG per tablet Take 1 tablet by mouth daily.  30 tablet  11  . furosemide (LASIX) 20 MG tablet Take 20 mg by mouth daily as needed.       . isosorbide mononitrate (IMDUR) 120 MG 24 hr tablet Take 120 mg by mouth daily.        . meclizine (ANTIVERT) 25 MG tablet Take 25 mg by mouth. 1/2 to 1 tab by mouth every 4 hours as needed for dizziness       . metFORMIN (GLUCOPHAGE-XR) 500 MG 24 hr tablet take 1 tablet by mouth twice a day  60 tablet  2  . metoprolol (TOPROL-XL) 50 MG 24 hr tablet Take 25 mg by mouth 2 (two) times daily. 1 1/2 once daily      . pantoprazole (PROTONIX) 40 MG tablet Take 1 tablet (40 mg total) by mouth daily.  180 tablet  0  . saxagliptin HCl (ONGLYZA) 2.5 MG TABS tablet Take by mouth daily. 1 tab each morning      . sodium chloride (OCEAN NASAL SPRAY) 0.65 % nasal spray 1 spray by Nasal route as needed.        . ferrous sulfate 325 (65 FE) MG tablet Take 325 mg by mouth  daily with breakfast.      . [DISCONTINUED] amoxicillin-clavulanate (AUGMENTIN) 875-125 MG per tablet Take 1 tablet by mouth 2 (two) times daily.  14 tablet  0  . [DISCONTINUED] eszopiclone (LUNESTA) 2 MG TABS Take 1 tablet (2 mg total) by mouth at bedtime. Take immediately before bedtime  30 tablet  5  . [DISCONTINUED] ezetimibe (ZETIA) 10 MG tablet Take 10 mg by mouth daily.        . [DISCONTINUED] methylPREDNISolone (MEDROL, PAK,) 4 MG tablet follow package directions  21 tablet  0  . [DISCONTINUED] temazepam (RESTORIL) 15 MG capsule Take 1 capsule (15 mg total) by mouth at bedtime.  30 capsule  1  . [DISCONTINUED] traMADol (ULTRAM) 50 MG tablet Take 1 tablet (50 mg total) by mouth 3 (three) times daily as needed for pain.  90 tablet  5  . [DISCONTINUED] vitamin B-12 (CYANOCOBALAMIN) 1000 MCG tablet Take 1,000 mcg by mouth daily.       No facility-administered encounter medications on file as of 08/08/2013.    Allergies  Allergen Reactions  . Codeine     REACTION: vomiting  . Morphine     REACTION: vomiting  . Pioglitazone     REACTION: pt states INTOL w/ edema  . Sitagliptin Phosphate     REACTION: nausea    Current Medications, Allergies, Past Medical History, Past Surgical History, Family History, and Social History were reviewed in [employer] record.   Review of Systems  See HPI - all other systems neg except as noted...      The patient complains of weight gain, dyspnea on exertion, and peripheral edema.  The patient denies anorexia, fever, weight loss, vision loss, decreased hearing, hoarseness, chest pain, syncope, prolonged cough, headaches, hemoptysis, abdominal pain, melena, hematochezia, severe indigestion/heartburn, hematuria, incontinence, genital sores, suspicious skin lesions, transient blindness, difficulty walking, depression, unusual weight change, abnormal bleeding, enlarged lymph nodes, and angioedema.     Objective:   Physical  Exam     WD, Overweight, 56 y/o BF in NAD... GENERAL:  Alert & oriented; pleasant & cooperative. HEENT:  [place]/AT, EOM-wnl, PERRLA, EACs-clear, TMs-wnl, NOSE-clear, THROAT-clear & wnl. NECK:  Supple w/ fairROM; no JVD; normal carotid impulses w/o bruits; no thyromegaly or nodules palpated; no lymphadenopathy. CHEST:  Clear to P & A; without wheezes/ rales/ or rhonchi heard; sl tender on palpation of chest wall. HEART:  Regular Rhythm; without murmurs/ rubs/ or gallops. ABDOMEN:  Soft & nontender; normal bowel sounds; no organomegaly or masses detected. EXT: without deformities, mild arthritic changes; no varicose veins but +venous insuffic & tr edema; +trigger points. NEURO:  CN's intact;  no focal neuro deficits... DERM:  No lesions noted; no rash etc...  RADIOLOGY DATA:  Reviewed in the EPIC EMR & discussed w/ the patient...  LABORATORY DATA:  Reviewed in the EPIC EMR & discussed w/ the patient...   Assessment & Plan:    RESP>  Hx AR, Asthma, old sarcoid, mild OSA>  All stable, breathing OK, but she notes some wheezing w/ exercise;  On Mucinex & asked to use her Xopenex prior to exercise which she didn't do; we could consider a leukotriene receptor modifier in the future if nec... CXR remains stable/ NAD...  CARDS>  Followed by DrTTurner/ Leron Croak ?on meds listed? SHE DID NOT BRING MEDS TO THE OV> Hx HBP, CAD, VI, etc;  Chest pain is non-cardiac & likely related to her FM; Doing satis & we reviewed exercise program etc...  CHOL>  Doing satis on the Lip ?40 ?80 + off Zetia; ? Taking a study drug from [facility]...  DM>  Followed by [name] on Metformin, Onglyzal; A1c improved to 6.7 in 2012, then 6.0 in 2013, & 6.5 now;  rec continue meds & asked to have records sent to [name]...  GI>  Stable on Protonix as needed...  FM>  She has long standing FM which appears to have flaired up recently- not resting, no energy, aches & pains, etc; we discussed getting proper sleep- she is intol to  [place]; on Tramadol50 prn pain...  Anxiety>  She remains on Alprazolam as needed; & she stopped the Lexapro..  Anemia, Low Ferritin> .we will initiate therapy w/ FeSO4 325mg  Bid & VitC500Bid, plan f/u LABS in 3 mo & consider systemic therapy if not improving...   Patient's Medications  New Prescriptions   AZITHROMYCIN (ZITHROMAX) 250 MG TABLET    Take as directed  Previous Medications   ASPIRIN 81 MG TABLET    Take 81 mg by mouth daily.     ATORVASTATIN (LIPITOR) 40 MG TABLET    Take 40 mg by mouth daily.     CYANOCOBALAMIN 1000 MCG/ML KIT    Inject 1,000 mg as directed every 30 (thirty) days.   DILTIAZEM (CARDIZEM CD) 240 MG 24 HR CAPSULE    Take 240 mg by mouth daily.     ESTROGEN, CONJUGATED,-MEDROXYPROGESTERONE (PREMPRO) 0.3-1.5 MG PER TABLET    Take 1 tablet by mouth daily.  FERROUS SULFATE 325 (65 FE) MG TABLET    Take 325 mg by mouth daily with breakfast.   FUROSEMIDE (LASIX) 20 MG TABLET    Take 20 mg by mouth daily as needed.    ISOSORBIDE MONONITRATE (IMDUR) 120 MG 24 HR TABLET    Take 120 mg by mouth daily.     MECLIZINE (ANTIVERT) 25 MG TABLET    Take 25 mg by mouth. 1/2 to 1 tab by mouth every 4 hours as needed for dizziness    METFORMIN (GLUCOPHAGE-XR) 500 MG 24 HR TABLET    take 1 tablet by mouth twice a day   METOPROLOL (TOPROL-XL) 50 MG 24 HR TABLET    Take 25 mg by mouth 2 (two) times daily. 1 1/2 once daily   SAXAGLIPTIN HCL (ONGLYZA) 2.5 MG TABS TABLET    Take by mouth daily. 1 tab each morning   SODIUM CHLORIDE (OCEAN NASAL SPRAY) 0.65 % NASAL SPRAY    1 spray by Nasal route as needed.    Modified Medications   Modified Medication Previous Medication   ALPRAZOLAM (XANAX) 0.5 MG TABLET ALPRAZolam (XANAX) 0.5 MG tablet      take 1/2 tablet by mouth MID MORNING AND MID AFTERNOON, and 1 tablet by mouth at bedtime    take 1/2 tablet by mouth MID MORNING AND MID AFTERNOON, and 1 tablet by mouth at bedtime   PANTOPRAZOLE (PROTONIX) 40 MG TABLET pantoprazole (PROTONIX) 40 MG  tablet      Take 1 tablet (40 mg total) by mouth daily.    Take 1 tablet (40 mg total) by mouth daily.  Discontinued Medications   AMOXICILLIN-CLAVULANATE (AUGMENTIN) 875-125 MG PER TABLET    Take 1 tablet by mouth 2 (two) times daily.   ESZOPICLONE (LUNESTA) 2 MG TABS    Take 1 tablet (2 mg total) by mouth at bedtime. Take immediately before bedtime   EZETIMIBE (ZETIA) 10 MG TABLET    Take 10 mg by mouth daily.     METHYLPREDNISOLONE (MEDROL, PAK,) 4 MG TABLET    follow package directions   TEMAZEPAM (RESTORIL) 15 MG CAPSULE    Take 1 capsule (15 mg total) by mouth at bedtime.   TRAMADOL (ULTRAM) 50 MG TABLET    Take 1 tablet (50 mg total) by mouth 3 (three) times daily as needed for pain.   VITAMIN B-12 (CYANOCOBALAMIN) 1000 MCG TABLET    Take 1,000 mcg by mouth daily.

## 2013-08-08 NOTE — Patient Instructions (Addendum)
Today we updated your med list in our EPIC system...    Continue your current medications the same...  For your Upper resp tract>>    Take the ZPak antibiotic...    We gave you a depo shot for the inflammation...    Use the MUCINEX 1200mg  twice daily (2 of the 600mg  tabs twice daily) plus lots of water by mouth...    Continue the nasal saline mist...  For the Anemia (per [name])>>    We are rechecking your blood work today...       We will contact you w/ the results when available... Then we will decide on medication for this & appropriate follow up.    We will refer your cheart to [name] for consideration of your colonoscopy which is due...  Call for any questions.Marland KitchenMarland Kitchen

## 2013-08-09 LAB — IBC PANEL
Iron: 47 ug/dL (ref 42–145)
Transferrin: 288.1 mg/dL (ref 212.0–360.0)

## 2013-08-09 LAB — FERRITIN: Ferritin: 10.8 ng/mL (ref 10.0–291.0)

## 2013-08-24 ENCOUNTER — Telehealth: Payer: Self-pay | Admitting: Pulmonary Disease

## 2013-08-24 NOTE — Telephone Encounter (Signed)
Spoke with pt  She states that someone called her this am and she is unsure why Her mailbox is full so no msg was left  She has already received her lab results and so I can not tell why she would have been called Leigh, please advise, thanks!

## 2013-08-27 NOTE — Telephone Encounter (Signed)
Leigh, did you call patient about anything else besides lab work results? Jocelyn [name] spoke with patient and scheduled her appointment with SN already. Thanks.

## 2013-08-27 NOTE — Telephone Encounter (Signed)
Nothing further was needed.

## 2013-09-04 ENCOUNTER — Telehealth: Payer: Self-pay | Admitting: Pharmacist

## 2013-09-04 NOTE — Telephone Encounter (Signed)
Email from Arsenio Loader:   "Jocelyn [name] (DOB 10-12-2056) came in for her one year visit in the [employer] ACCELERATE study. She was noted to have an elevated CPK of 754. She denied any change in activity, recent injuries, injections etc. I pulled out her previous values and they have been consistently creeping up. V2 07/07/12: 298 (prior to starting study drug) V3 08/11/13: 363 V5 01/05/13: 524 V7 08/29/13: 754 This pattern is concerning for a study drug effect, although I really have not seen much reported about CK elevations with CETP inhibitors. She is on Lipitor 80 mg, so you wonder about the statin or maybe the combination. My thought was to give her a drug holiday from the study medication for 4 weeks and then re-check the value. If no change, then we could address the Lipitor."  Jocelyn [name]'s response:  Dr. Eldridge Dace, I think this is the reasonable intervention to make as patient had a slightly elevated CK at start of study, is on Lipitor 80 mg qd, and little report of CETP  (Evacetrapib in this case) causing this effect.  I agree with holding study drug for 4 weeks and if CK still trending up consider holding lipitor.  Please let me know if you would like anything done differently. I will then let Dr. Dayton Scrape know.  Thanks.

## 2013-09-04 NOTE — Telephone Encounter (Signed)
I agree with the plan.

## 2013-09-05 NOTE — Telephone Encounter (Signed)
[facility] notified.

## 2013-09-20 ENCOUNTER — Other Ambulatory Visit: Payer: Self-pay

## 2013-10-06 ENCOUNTER — Encounter (HOSPITAL_COMMUNITY): Payer: Self-pay | Admitting: Emergency Medicine

## 2013-10-06 ENCOUNTER — Emergency Department (HOSPITAL_COMMUNITY)
Admission: EM | Admit: 2013-10-06 | Discharge: 2013-10-06 | Disposition: A | Discharge status: Home / Self Care | Payer: BC Managed Care – PPO | Source: Home / Self Care | Attending: Family Medicine | Admitting: Family Medicine

## 2013-10-06 DIAGNOSIS — S39012A Strain of muscle, fascia and tendon of lower back, initial encounter: Secondary | ICD-10-CM

## 2013-10-06 DIAGNOSIS — S335XXA Sprain of ligaments of lumbar spine, initial encounter: Secondary | ICD-10-CM

## 2013-10-06 LAB — POCT URINALYSIS DIP (DEVICE)
Bilirubin Urine: NEGATIVE
Glucose, UA: NEGATIVE mg/dL
Hgb urine dipstick: NEGATIVE
Leukocytes, UA: NEGATIVE
Specific Gravity, Urine: 1.01 (ref 1.005–1.030)

## 2013-10-06 MED ORDER — METAXALONE 800 MG PO TABS: 800.0000 mg | ORAL_TABLET | Freq: Three times a day (TID) | ORAL | Status: DC

## 2013-10-06 NOTE — ED Notes (Signed)
57 yr old woman is here today with complaints of lower back pain x last night. She states she has not fallen or been injured. No Hx of UTI; Hx of Kidney Stone x 7 yrs ago - really did not have any pain - showed up on a scan.

## 2013-10-06 NOTE — ED Provider Notes (Signed)
CSN: [id number]     Arrival date & time 10/06/13  1827 History   First MD Initiated Contact with Patient 10/06/13 1838     Chief Complaint  Patient presents with  . Back Pain   (Consider location/radiation/quality/duration/timing/severity/associated sxs/prior Treatment) HPI Comments: Patient reports that when she went to get up from a chair yesterday she developed pain across the lower part of her back. States she took some Aleve and then went to bed. This morning she felt well enough to go to work, but pain returned as she was working. 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 Took another dose of Aleve and once she was finished at work, came to [facility] for evaluation. Denies any changes in strength or sensation in her lower extremities. No incontinence. No fever, flank pain or hematuria.   The history is provided by the patient.    Past Medical History  Diagnosis Date  . Meniere's disease, unspecified   . Allergic rhinitis, cause unspecified   . Acute bronchitis   . Sarcoidosis   . Obstructive sleep apnea (adult) (pediatric)   . Essential hypertension, benign   . Coronary atherosclerosis of unspecified type of vessel, native or graft   . Unspecified venous (peripheral) insufficiency   . Pure hypercholesterolemia   . Type II or unspecified type diabetes mellitus without mention of complication, not stated as uncontrolled   . Irritable bowel syndrome   . Calculus of kidney   . Fibromyalgia   . Vitamin D deficiency   . Dizziness   . Anxiety   . Myalgia and myositis, unspecified   . Calculus of kidney   . BV (bacterial vaginosis) 06/22/1996  . H/O varicella   . Headache(784.0)     frequently  . Yeast infection   . Menses, irregular 2003  . Perimenopausal symptoms 2003  . Fibroid 2003  . H/O dysmenorrhea 2008  . HSV-2 infection 2009  . Vulvitis 2010   History reviewed. No pertinent past surgical history. Family History  Problem  Relation Age of Onset  . Adopted: Yes   History  Substance Use Topics  . Smoking status: Never Smoker   . Smokeless tobacco: Never Used     Comment: Daily Caffeine - 1  Exercise 2-3 times/weekly  . Alcohol Use: No   OB History   Grav Para Term Preterm Abortions TAB SAB Ect Mult Living   1 1 1       1      Review of Systems  Constitutional: Negative.   HENT: Negative.   Respiratory: Negative.   Cardiovascular: Negative.   Gastrointestinal: Negative.   Genitourinary: Negative.   Musculoskeletal: Positive for back pain.  Skin: Negative.   Neurological: Negative.   Psychiatric/Behavioral: Negative.     Allergies  Codeine; Morphine; Pioglitazone; and Sitagliptin phosphate  Home Medications   Current Outpatient Rx  Name  Route  Sig  Dispense  Refill  . ALPRAZolam (XANAX) 0.5 MG tablet      take 1/2 tablet by mouth MID MORNING AND MID AFTERNOON, and 1 tablet by mouth at bedtime   60 tablet   5   . aspirin 81 MG tablet   Oral   Take 81 mg by mouth daily.           [name] atorvastatin (LIPITOR) 40 MG tablet   Oral   Take 40 mg by mouth daily.           . Cyanocobalamin 1000 MCG/ML  KIT   Injection   Inject 1,000 mg as directed every 30 (thirty) days.         [name] diltiazem (CARDIZEM CD) 240 MG 24 hr capsule   Oral   Take 240 mg by mouth daily.           [name] estrogen, conjugated,-medroxyprogesterone (PREMPRO) 0.3-1.5 MG per tablet   Oral   Take 1 tablet by mouth daily.   30 tablet   11   . furosemide (LASIX) 20 MG tablet   Oral   Take 20 mg by mouth daily as needed.          . isosorbide mononitrate (IMDUR) 120 MG 24 hr tablet   Oral   Take 120 mg by mouth daily.           . meclizine (ANTIVERT) 25 MG tablet   Oral   Take 25 mg by mouth. 1/2 to 1 tab by mouth every 4 hours as needed for dizziness          . metoprolol (TOPROL-XL) 50 MG 24 hr tablet   Oral   Take 25 mg by mouth 2 (two) times daily. 1 1/2 once daily         . pantoprazole  (PROTONIX) 40 MG tablet   Oral   Take 1 tablet (40 mg total) by mouth daily.   180 tablet   3   . saxagliptin HCl (ONGLYZA) 2.5 MG TABS tablet   Oral   Take by mouth daily. 1 tab each morning         . Saxagliptin-Metformin (KOMBIGLYZE XR) 03-999 MG TB24   Oral   Take 1 tablet by mouth once.         . sodium chloride (OCEAN NASAL SPRAY) 0.65 % nasal spray   Nasal   1 spray by Nasal route as needed.           [name] azithromycin (ZITHROMAX) 250 MG tablet      Take as directed   6 tablet   0   . ferrous sulfate 325 (65 FE) MG tablet   Oral   Take 325 mg by mouth daily with breakfast.         . metaxalone (SKELAXIN) 800 MG tablet   Oral   Take 1 tablet (800 mg total) by mouth 3 (three) times daily.   21 tablet   0   . metFORMIN (GLUCOPHAGE-XR) 500 MG 24 hr tablet      take 1 tablet by mouth twice a day   60 tablet   2    BP 136/74  Pulse 109  Temp(Src) 97.9 F (36.6 C) (Oral)  Resp 18  SpO2 100% Physical Exam  Nursing note and vitals reviewed. Constitutional: She is oriented to person, place, and time. She appears well-developed and well-nourished. No distress.  Ambulatory into UC without difficulty or assistance.  HENT:  Head: Normocephalic and atraumatic.  Eyes: Conjunctivae are normal.  Neck: Normal range of motion. Neck supple.  Cardiovascular: Regular rhythm and normal heart sounds.   Pulmonary/Chest: Effort normal and breath sounds normal.  Abdominal: Soft. Bowel sounds are normal. She exhibits no distension. There is no tenderness. There is no guarding.  Musculoskeletal:       Lumbar back: She exhibits decreased range of motion and pain.       Back:       Arms: CSM exam of both lower extremities normal/intact.  Neurological: She is alert and oriented to person, place, and time.  Skin: Skin is warm and dry. No pallor.  Psychiatric: She has a normal mood and affect. Her behavior is normal.    ED Course  Procedures (including critical care  time) Labs Review Labs Reviewed  POCT URINALYSIS DIP (DEVICE)   Imaging Review No results found.  EKG Interpretation    Date/Time:    Ventricular Rate:    PR Interval:    QRS Duration:   QT Interval:    QTC Calculation:   R Axis:     Text Interpretation:              MDM   1. Lumbar strain, initial encounter        Jess Barters [place], [place] 10/06/13 2021

## 2013-10-08 NOTE — ED Provider Notes (Signed)
Medical screening examination/treatment/procedure(s) were performed by a resident physician or non-physician practitioner and as the supervising physician I was immediately available for consultation/collaboration.  Anner Baity, MD    Desmond Tufano S Aune Adami, MD 10/08/13 0802 

## 2013-10-09 ENCOUNTER — Telehealth: Payer: Self-pay | Admitting: Pulmonary Disease

## 2013-10-09 MED ORDER — TRAMADOL HCL 50 MG PO TABS: 50.0000 mg | ORAL_TABLET | Freq: Three times a day (TID) | ORAL | Status: DC | PRN

## 2013-10-09 NOTE — Telephone Encounter (Signed)
Called spoke with patient, advised of SN's recs as stated below.  Pt verbalized her understanding and wrote the recommendations down.  Tramadol 50mg  #90, 1 po TID prn telephoned to [facility] to pharmacist Cassie.  Nothing further needed at this time; will sign off.

## 2013-10-09 NOTE — Telephone Encounter (Signed)
Per SN---  Rest Heating pad Apply deep heat cream like myoflex otc Call in tramadol 50 mg  1  Po tid prn pain  #90.  thanks

## 2013-10-09 NOTE — Telephone Encounter (Signed)
I called and spoke with pt. She c/o lower back pain x Friday night. She went to [facility] Saturday night and was giving skelaxin. She reports the pain is sharpe and constant. She reports the skelaxin is not helping. Please advise Dr. Kriste Basque thanks  Allergies  Allergen Reactions  . Codeine     REACTION: vomiting  . Morphine     REACTION: vomiting  . Pioglitazone     REACTION: pt states INTOL w/ edema  . Sitagliptin Phosphate     REACTION: nausea

## 2013-10-09 NOTE — Telephone Encounter (Signed)
Spoke to pt she can not set up GI appt right now will call back later Jocelyn [name]

## 2013-10-16 ENCOUNTER — Encounter: Payer: Self-pay | Admitting: *Deleted

## 2013-10-17 ENCOUNTER — Telehealth: Payer: Self-pay | Admitting: Pharmacist

## 2013-10-17 NOTE — Telephone Encounter (Signed)
Per Dr. Dayton Scrape at [facility]: As you remember, Ms Donati is in the ACCELERATE study (CETP inhibitor). We had recently given her a drug holiday due to a progressive elevation in her CK. 07/07/12 (Screen) 298 08/11/12 363 01/05/13 524 08/29/13 754 We stopped the study drug on 09/03/13. She now returns after being off the study drug for 1 month. Her CK on 10/08/13 is 722. She is currently on Lipitor 80 mg daily. It looks like she has been on Lipitor since 2011.  She remains asymptomatic.  Dr. Eldridge Dace, I have suggested continue to hold study drug, and to hold Lipitor 80 mg for 2-4 weeks, and then rechecking her CK at that time - the study team has agreed to this.  If the CK comes down we may restart study drug but at a lower dose of lipitor.  Patient is not having any muscle aches or weakness.  Mrs. Pritts's sister is in health care, and put the bug in her ear that her elevated CK may be from a heart attach.  Dr. Dayton Scrape has tried to reassure her this isn't the case, but Dr. Dayton Scrape felt a phone call from [name] may help reassure her.    First of all, are you okay with plan of holding statin for 2-4 weeks and rechecking CK at that time? Secondly, do you want me to relay any particular information to patient about elevated CK vs heart disease?  Thanks, Riki Rusk

## 2013-10-17 NOTE — Telephone Encounter (Signed)
I am OK with holding statin for 2-4 weeks and rechecking CK at that time.  I agree that this CK is not from a heart attack. Please explain to her that CK can come from any kind of muscle, anywhere in her body.  If she would feel better, we can check a CK-MB as well.  I personally do not think it is necessary.

## 2013-10-18 NOTE — Telephone Encounter (Signed)
Spoke with patient and she is agreeable with plan to hold lipitor for a few weeks and recheck CK.  I explained the difference b/t CK and CK-MB and she feels better.  She is due to see you anyway in 10 days, so if still having issues or questions at that time, blood work could be drawn then.  To Dr. Eldridge Dace as Lorain Childes.

## 2013-10-23 ENCOUNTER — Ambulatory Visit: Payer: BC Managed Care – PPO | Admitting: Interventional Cardiology

## 2013-10-26 ENCOUNTER — Telehealth: Payer: Self-pay | Admitting: Pulmonary Disease

## 2013-10-26 MED ORDER — AZITHROMYCIN 250 MG PO TABS: 250.0000 mg | ORAL_TABLET | Freq: Every day | ORAL | Status: DC

## 2013-10-26 NOTE — Telephone Encounter (Signed)
Spoke with patient-states she has had increased chest congestion, wheezing/SOB, cough-productive-yellow for about 3-4 days; Denies any fever or chills. Has been taking Mucinex DM 2 po BID since last Friday. Would like Rx called to pharmacy on file.  Allergies  Allergen Reactions  . Codeine     REACTION: vomiting  . Morphine     REACTION: vomiting  . Pioglitazone     REACTION: pt states INTOL w/ edema  . Sitagliptin Phosphate     REACTION: nausea

## 2013-10-26 NOTE — Telephone Encounter (Signed)
Per SN- lets call in Zpak #1 take as directed no refills, Mucinex 2 po BID, rest , fluids, and Tylenol. Pt is aware of recs and that Rx has been sen to pharmacy.

## 2013-10-29 ENCOUNTER — Ambulatory Visit: Payer: BC Managed Care – PPO | Admitting: Interventional Cardiology

## 2013-10-29 ENCOUNTER — Encounter: Payer: Self-pay | Admitting: Interventional Cardiology

## 2013-10-29 ENCOUNTER — Ambulatory Visit (INDEPENDENT_AMBULATORY_CARE_PROVIDER_SITE_OTHER): Payer: BC Managed Care – PPO | Admitting: Interventional Cardiology

## 2013-10-29 VITALS — BP 120/80 | HR 100 | Ht 62.0 in | Wt 190.0 lb

## 2013-10-29 DIAGNOSIS — E782 Mixed hyperlipidemia: Secondary | ICD-10-CM

## 2013-10-29 DIAGNOSIS — I10 Essential (primary) hypertension: Secondary | ICD-10-CM

## 2013-10-29 DIAGNOSIS — I2510 Atherosclerotic heart disease of native coronary artery without angina pectoris: Secondary | ICD-10-CM

## 2013-10-29 DIAGNOSIS — E785 Hyperlipidemia, unspecified: Secondary | ICD-10-CM | POA: Insufficient documentation

## 2013-10-29 DIAGNOSIS — E669 Obesity, unspecified: Secondary | ICD-10-CM

## 2013-10-29 MED ORDER — NITROGLYCERIN 0.4 MG SL SUBL: 0.4000 mg | SUBLINGUAL_TABLET | SUBLINGUAL | Status: DC | PRN

## 2013-10-29 MED ORDER — METOPROLOL TARTRATE 25 MG PO TABS: 25.0000 mg | ORAL_TABLET | Freq: Two times a day (BID) | ORAL | Status: DC

## 2013-10-29 NOTE — Progress Notes (Signed)
Patient ID: Jocelyn [name], female   DOB: 1956-08-18, 57 y.o.   MRN: [id number]    [address] [place], [place]  [address] Phone: [phone] Fax:  [phone]  Date:  10/29/2013   ID:  Jocelyn [name], DOB 10-17-1956, MRN [id number]  PCP:  Michele Mcalpine, MD      History of Present Illness: Jocelyn [name] is a 57 y.o. female who has had mild CAD in a branch vessel. She uses NTG occasionally. SHe does Zumba less than once a week and does walk on the other days. She gets [facility] with this but no CP.  Limited by aching in her muscles.  Lipitor was stopped recently.   BP outside of the MDs office are close to 120/80. CAD/ASCVD:  Denies : Diaphoresis.  Dizziness.  Leg edema.  Palpitations.  Syncope.     Wt Readings from Last 3 Encounters:  10/29/13 190 lb (86.183 kg)  08/08/13 190 lb 6.4 oz (86.365 kg)  12/04/12 186 lb 3.2 oz (84.46 kg)     Past Medical History  Diagnosis Date  . Meniere's disease, unspecified   . Allergic rhinitis, cause unspecified   . Acute bronchitis   . Sarcoidosis   . Obstructive sleep apnea (adult) (pediatric)   . Essential hypertension, benign   . Coronary atherosclerosis of unspecified type of vessel, native or graft   . Unspecified venous (peripheral) insufficiency   . Pure hypercholesterolemia   . Type II or unspecified type diabetes mellitus without mention of complication, not stated as uncontrolled   . Irritable bowel syndrome   . Calculus of kidney   . Fibromyalgia   . Vitamin D deficiency   . Dizziness   . Anxiety   . Myalgia and myositis, unspecified   . Calculus of kidney   . BV (bacterial vaginosis) 06/22/1996  . H/O varicella   . Headache(784.0)     frequently  . Yeast infection   . Menses, irregular 2003  . Perimenopausal symptoms 2003  . Fibroid 2003  . H/O dysmenorrhea 2008  . HSV-2 infection 2009  . Vulvitis 2010  . B12 deficiency   . Anemia     Current Outpatient Prescriptions  Medication Sig Dispense  Refill  . ALPRAZolam (XANAX) 0.5 MG tablet take 1/2 tablet by mouth MID MORNING AND MID AFTERNOON, and 1 tablet by mouth at bedtime  60 tablet  5  . amLODipine (NORVASC) 5 MG tablet Take 5 mg by mouth daily.       [name] aspirin 81 MG tablet Take 81 mg by mouth daily.        [name] azithromycin (ZITHROMAX) 250 MG tablet Take 1 tablet (250 mg total) by mouth daily.  6 tablet  0  . Cyanocobalamin 1000 MCG/ML KIT Inject 1,000 mg as directed every 30 (thirty) days.      [name] diltiazem (CARDIZEM CD) 240 MG 24 hr capsule Take 240 mg by mouth daily.        . ferrous sulfate 325 (65 FE) MG tablet Take 325 mg by mouth daily with breakfast.      . furosemide (LASIX) 20 MG tablet Take 20 mg by mouth daily as needed.       . meclizine (ANTIVERT) 25 MG tablet Take 25 mg by mouth. 1/2 to 1 tab by mouth every 4 hours as needed for dizziness       . pantoprazole (PROTONIX) 40 MG tablet Take 1 tablet (40 mg total) by mouth daily.  180 tablet  3  . Saxagliptin-Metformin (KOMBIGLYZE XR) 03-999 MG TB24 Take 1 tablet by mouth once.      . sodium chloride (OCEAN NASAL SPRAY) 0.65 % nasal spray 1 spray by Nasal route as needed.        . traMADol (ULTRAM) 50 MG tablet Take 1 tablet (50 mg total) by mouth 3 (three) times daily as needed.  90 tablet  0  . valACYclovir (VALTREX) 500 MG tablet Take 500 mg by mouth daily.       [name] estrogen, conjugated,-medroxyprogesterone (PREMPRO) 0.3-1.5 MG per tablet Take 1 tablet by mouth daily.  30 tablet  11  . metoprolol (TOPROL-XL) 50 MG 24 hr tablet Take 25 mg by mouth 2 (two) times daily. 1/2 bid       No current facility-administered medications for this visit.    Allergies:    Allergies  Allergen Reactions  . Codeine     REACTION: vomiting  . Morphine     REACTION: vomiting  . Pioglitazone     REACTION: pt states INTOL w/ edema  . Sitagliptin Phosphate     REACTION: nausea    Social History:  The patient  reports that she has never smoked. She has never used smokeless tobacco.  She reports that she does not drink alcohol or use illicit drugs.   Family History:  The patient's She was adopted. Family history is unknown by patient.   ROS:  Please see the history of present illness.  No nausea, vomiting.  No fevers, chills.  No focal weakness.  No dysuria.    All other systems reviewed and negative.   PHYSICAL EXAM: VS:  BP 120/80  Pulse 100  Ht 5\' 2"  (1.575 m)  Wt 190 lb (86.183 kg)  BMI 34.74 kg/m2 Well nourished, well developed, in no acute distress HEENT: normal Neck: no JVD, no carotid bruits Cardiac:  normal S1, S2; RRR;  Lungs:  clear to auscultation bilaterally, no wheezing, rhonchi or rales Abd: soft, nontender, no hepatomegaly Ext: no edema Skin: warm and dry Neuro:   no focal abnormalities noted      ASSESSMENT AND PLAN:  Coronary atherosclerosis of native coronary artery  Continue Lopressor Tablet, 25 MG, 1 tablet, Orally, 1 tablet in am and 1 tablet in PM Notes: Branch vessel disease. She is off of Ranexa. No angina. Restart Imdur if CP returns. Hold off Imdur at this time. She had headaches in the past with this medicine.    2. Benign hypertensive heart disease without heart failure  Continue Cardizem CD Capsule Extended Release 24 Hour, 240 MG, 1 capsule, Orally, once a day Continue Amlodipine Besylate Tablet, 5 MG, 1 tablet, Orally, Once a day Notes: COntrolled.    3. Hypercholesteremia, pure  stopped Lipitor Tablet, 80 MG, 1 tablet, Orally, once a day stopped Co Q-10 Capsule, 200 MG, 1 capsule with a meal, Orally, Once a day Notes: She is in a [employer] study. Did not tolerate Zetia.Lipitor stopped this due to leg pain, increased CPK. . she is very concerned about her research studies. She is also in a study at [facility]. She received some news from them which she found her alarming. This clearly makes her anxious. I encouraged her to stay in the [employer] study.   Our Pharmacist ,Riki Rusk is following her lab work closely.   4.  obesity: She wanted to start on some type of weight loss drug. She states that her insurance company denied this. She would like to  appeal if we prove it.  I asked her to Call us with the name of the medicine. Preventive Medicine  Adult topics discussed:  Diet: healthy diet.  Exercise: at least 30 minutes of aerobic exercise, 5 days a week.      Signed, Fredric Mare, MD, [facility] 10/29/2013 4:14 PM

## 2013-10-29 NOTE — Patient Instructions (Addendum)
Your physician wants you to follow-up in: 6 months with Dr. Eldridge Dace. You will receive a reminder letter in the mail two months in advance. If you don't receive a letter, please call our office to schedule the follow-up appointment.  Your physician has recommended you make the following change in your medication:   1. Start Metoprolol Tartrate 25 mg 1 tablet by mouth daily.  2. Continue all other medications.   Refilled Nitroglycerin 0.4 mg to use as needed.

## 2013-11-02 ENCOUNTER — Encounter: Payer: Self-pay | Admitting: Internal Medicine

## 2013-11-02 ENCOUNTER — Encounter: Payer: Self-pay | Admitting: Cardiology

## 2013-11-02 ENCOUNTER — Encounter: Payer: Self-pay | Admitting: Pharmacist

## 2013-11-06 ENCOUNTER — Encounter: Payer: Self-pay | Admitting: Pulmonary Disease

## 2013-11-13 ENCOUNTER — Ambulatory Visit: Payer: BC Managed Care – PPO | Admitting: Pulmonary Disease

## 2013-12-07 ENCOUNTER — Other Ambulatory Visit: Payer: Self-pay | Admitting: Interventional Cardiology

## 2013-12-19 ENCOUNTER — Telehealth: Payer: Self-pay | Admitting: Pharmacist

## 2013-12-19 NOTE — Telephone Encounter (Signed)
Patient is in the [employer] [place] study receiving either Evacetrapib or placebo, and also on Lipitor 80 mg qd.  Patient has a h/o CAD and diabetes.  She enrolled into study 06/2012 and CK was 298 at baseline on lipitor 80 mg qd.  Over next few months CK trended up and in 08/2013 it was 754 and the study drug was stopped.  Four weeks later CK was 722 so lipitor was also d/c.  Now (12/2013) CK is 697 despite being off both agents for 2 months.  Patient is asymptomatic.  Don't have any other CK measurements on file from prior to 06/2012 when she was started on study med.  Patient does have a h/o fibromyalgis and sarcoidosis both which could explain some level of elevated CK.    I discussed with Dr. Irish Lack, and want to hold both lipitor and study med for another 2 months and recheck CK at that time.  If CK comes back ~ 400 or less, will likely restart atorvastatin at that time.  Unlikely to restart study med unless we determine a likely etiology for her elevated CK.  Dr. Valere Dross ([place]) is going to call patient today to discuss.  He is also going to make sure she isn't in another study, as there is a mention of another study she may be in that is in [facility].  He will keep of posted of results.  To Dr. Irish Lack as Juluis Rainier.

## 2014-01-04 ENCOUNTER — Ambulatory Visit: Payer: BC Managed Care – PPO | Admitting: Internal Medicine

## 2014-01-18 ENCOUNTER — Telehealth: Payer: Self-pay | Admitting: *Deleted

## 2014-01-18 MED ORDER — AZITHROMYCIN 250 MG PO TABS: ORAL_TABLET | ORAL | Status: AC

## 2014-01-18 MED ORDER — METHYLPREDNISOLONE 4 MG PO KIT: PACK | ORAL | Status: DC

## 2014-01-18 NOTE — Telephone Encounter (Signed)
Per SN---  Cont the same meds that she has been taking zpak #1  Take as directed Medrol dosepak #1  Take as directed

## 2014-01-18 NOTE — Telephone Encounter (Signed)
Pt is aware of SN's recs. Both rx's have been sent in. Nothing further is needed. 

## 2014-01-18 NOTE — Telephone Encounter (Signed)
I attempted to call the pt. Called all the numbers we have on file for the pt. Didn't receive an answer, mail box was full.

## 2014-01-18 NOTE — Telephone Encounter (Signed)
Called and spoke with pt and she has been having nasal congestion and sore throat with light yellow sputum x 2 days.   Pt has been using nasal saline, mucinex, lozenges with out any relief.  Pt has been having headaches as well with no fever.   Pt is requesting something be called in to her pharmacy.   SN please advise. Thanks  Pt to call back to let us know who she would like to be set up with as new primary care.    Allergies  Allergen Reactions  . Codeine     REACTION: vomiting  . Morphine     REACTION: vomiting  . Pioglitazone     REACTION: pt states INTOL w/ edema  . Sitagliptin Phosphate     REACTION: nausea     Current Outpatient Prescriptions on File Prior to Visit  Medication Sig Dispense Refill  . ALPRAZolam (XANAX) 0.5 MG tablet take 1/2 tablet by mouth MID MORNING AND MID AFTERNOON, and 1 tablet by mouth at bedtime  60 tablet  5  . amLODipine (NORVASC) 5 MG tablet Take 5 mg by mouth daily.       [name] aspirin 81 MG tablet Take 81 mg by mouth daily.        [name] azithromycin (ZITHROMAX) 250 MG tablet Take 1 tablet (250 mg total) by mouth daily.  6 tablet  0  . Cyanocobalamin 1000 MCG/ML KIT Inject 1,000 mg as directed every 30 (thirty) days.      [name] diltiazem (CARDIZEM CD) 240 MG 24 hr capsule take 1 capsule by mouth once daily  30 capsule  4  . estrogen, conjugated,-medroxyprogesterone (PREMPRO) 0.3-1.5 MG per tablet Take 1 tablet by mouth daily.  30 tablet  11  . ferrous sulfate 325 (65 FE) MG tablet Take 325 mg by mouth daily with breakfast.      . furosemide (LASIX) 20 MG tablet Take 20 mg by mouth daily as needed.       . meclizine (ANTIVERT) 25 MG tablet Take 25 mg by mouth. 1/2 to 1 tab by mouth every 4 hours as needed for dizziness       . metoprolol tartrate (LOPRESSOR) 25 MG tablet Take 1 tablet (25 mg total) by mouth 2 (two) times daily.  60 tablet  6  . nitroGLYCERIN (NITROSTAT) 0.4 MG SL tablet Place 1 tablet (0.4 mg total) under the tongue every 5 (five) minutes as  needed for chest pain.  25 tablet  5  . pantoprazole (PROTONIX) 40 MG tablet Take 1 tablet (40 mg total) by mouth daily.  180 tablet  3  . Saxagliptin-Metformin (KOMBIGLYZE XR) 03-999 MG TB24 Take 1 tablet by mouth once.      . sodium chloride (OCEAN NASAL SPRAY) 0.65 % nasal spray 1 spray by Nasal route as needed.        . STUDY MEDICATION Evacetrapib (CETP inhibitor) lipid lowering study with [employer]      . traMADol (ULTRAM) 50 MG tablet Take 1 tablet (50 mg total) by mouth 3 (three) times daily as needed.  90 tablet  0  . valACYclovir (VALTREX) 500 MG tablet Take 500 mg by mouth daily.        No current facility-administered medications on file prior to visit.

## 2014-01-23 ENCOUNTER — Ambulatory Visit: Payer: BC Managed Care – PPO | Admitting: Pulmonary Disease

## 2014-02-10 ENCOUNTER — Other Ambulatory Visit: Payer: Self-pay | Admitting: Pulmonary Disease

## 2014-02-13 ENCOUNTER — Ambulatory Visit (INDEPENDENT_AMBULATORY_CARE_PROVIDER_SITE_OTHER): Payer: BC Managed Care – PPO | Admitting: Pulmonary Disease

## 2014-02-13 ENCOUNTER — Other Ambulatory Visit (INDEPENDENT_AMBULATORY_CARE_PROVIDER_SITE_OTHER): Payer: BC Managed Care – PPO

## 2014-02-13 ENCOUNTER — Encounter: Payer: Self-pay | Admitting: Pulmonary Disease

## 2014-02-13 VITALS — BP 124/76 | HR 91 | Temp 98.2°F | Ht 62.0 in | Wt 198.0 lb

## 2014-02-13 DIAGNOSIS — D509 Iron deficiency anemia, unspecified: Secondary | ICD-10-CM

## 2014-02-13 DIAGNOSIS — K589 Irritable bowel syndrome without diarrhea: Secondary | ICD-10-CM

## 2014-02-13 DIAGNOSIS — K219 Gastro-esophageal reflux disease without esophagitis: Secondary | ICD-10-CM

## 2014-02-13 DIAGNOSIS — R0600 Dyspnea, unspecified: Secondary | ICD-10-CM

## 2014-02-13 DIAGNOSIS — E119 Type 2 diabetes mellitus without complications: Secondary | ICD-10-CM

## 2014-02-13 DIAGNOSIS — F411 Generalized anxiety disorder: Secondary | ICD-10-CM

## 2014-02-13 DIAGNOSIS — R609 Edema, unspecified: Secondary | ICD-10-CM | POA: Insufficient documentation

## 2014-02-13 DIAGNOSIS — I10 Essential (primary) hypertension: Secondary | ICD-10-CM

## 2014-02-13 DIAGNOSIS — N200 Calculus of kidney: Secondary | ICD-10-CM

## 2014-02-13 DIAGNOSIS — I2510 Atherosclerotic heart disease of native coronary artery without angina pectoris: Secondary | ICD-10-CM

## 2014-02-13 DIAGNOSIS — E7800 Pure hypercholesterolemia, unspecified: Secondary | ICD-10-CM

## 2014-02-13 DIAGNOSIS — R0989 Other specified symptoms and signs involving the circulatory and respiratory systems: Secondary | ICD-10-CM

## 2014-02-13 DIAGNOSIS — R0609 Other forms of dyspnea: Secondary | ICD-10-CM

## 2014-02-13 DIAGNOSIS — D869 Sarcoidosis, unspecified: Secondary | ICD-10-CM

## 2014-02-13 DIAGNOSIS — IMO0001 Reserved for inherently not codable concepts without codable children: Secondary | ICD-10-CM

## 2014-02-13 DIAGNOSIS — I872 Venous insufficiency (chronic) (peripheral): Secondary | ICD-10-CM

## 2014-02-13 LAB — CBC WITH DIFFERENTIAL/PLATELET
BASOS ABS: 0 10*3/uL (ref 0.0–0.1)
Basophils Relative: 0.3 % (ref 0.0–3.0)
EOS ABS: 0.1 10*3/uL (ref 0.0–0.7)
Eosinophils Relative: 1.1 % (ref 0.0–5.0)
HEMATOCRIT: 37.8 % (ref 36.0–46.0)
HEMOGLOBIN: 12.5 g/dL (ref 12.0–15.0)
LYMPHS ABS: 1.7 10*3/uL (ref 0.7–4.0)
Lymphocytes Relative: 33.7 % (ref 12.0–46.0)
MCHC: 33.2 g/dL (ref 30.0–36.0)
MCV: 90 fl (ref 78.0–100.0)
Monocytes Absolute: 0.6 10*3/uL (ref 0.1–1.0)
Monocytes Relative: 11.3 % (ref 3.0–12.0)
NEUTROS ABS: 2.6 10*3/uL (ref 1.4–7.7)
Neutrophils Relative %: 53.6 % (ref 43.0–77.0)
Platelets: 328 10*3/uL (ref 150.0–400.0)
RBC: 4.2 Mil/uL (ref 3.87–5.11)
RDW: 13.1 % (ref 11.5–14.6)
WBC: 4.9 10*3/uL (ref 4.5–10.5)

## 2014-02-13 LAB — ANGIOTENSIN CONVERTING ENZYME: ANGIOTENSIN-CONVERTING ENZYME: 56 U/L — AB (ref 8–52)

## 2014-02-13 MED ORDER — ONDANSETRON HCL 4 MG PO TABS: 4.0000 mg | ORAL_TABLET | Freq: Three times a day (TID) | ORAL | Status: DC | PRN

## 2014-02-13 MED ORDER — FUROSEMIDE 20 MG PO TABS: ORAL_TABLET | ORAL | Status: DC

## 2014-02-13 MED ORDER — LOSARTAN POTASSIUM 50 MG PO TABS: 50.0000 mg | ORAL_TABLET | Freq: Every day | ORAL | Status: DC

## 2014-02-13 NOTE — Progress Notes (Signed)
Subjective:    Patient ID: Jocelyn [name], female    DOB: 02-14-1956, 58 y.o.   MRN: [id number]  HPI 58 y/o BF here for a follow up visit... she has multiple medical problems including:  AR & Asthma;  Hx Sarcoid;  Mild OSA;  HBP;  CAD;  Ven Insuffic;  Hyperchol;  DM;  GERD/ IBS;  Hx Kidney stones;  FM;  Vit D defic;  Anxiety...  ~  September 08, 2011:  54moROV & she has been reasonably stable, no new complaints or concerns;     AR/ AB/ Hx Sarcoid> on Xopenex Prn, Mucinex & MMW; denies cough, sputum, hemoptysis, ch in dyspnea, chest discomfort...    HBP/ CAD> Followed by DrTTurner on ASA81, Imdur120, ToprolXL75, Cardizem240, Lasix20; BP= 120/80, P= 78/reg; denies CP, palpit, syncope, SOB, edema; she reports check up 7/12 (we don't have note) & doing fine, no changes made...    CHOL> on Lip40, Zetia10, CoQ10; she has lost 9# on diet down to 186# today; FLP here 4/12 was wnl & reviewed w/ pt...    DM> Followed by DrEllison on Metform500Bid, Onglyza2.5, Parlodel2.5hs; A1c= 7.2 in July and repeated today at her request= 6.7; despite this good control she not happy w/ her care & requests second opinion from [place] refer...    GI> GERD, IBS> on Protonix40; denies abd pain, n/v, d/c/blood...    Anxiety> on Xanax0.551mPrn, she was INTOL to Zoloft...  ~  June 02, 2012:  55m655moV & Jocelyn [name] says she is doing well w/o new complaints or concerns...    AR/ AB/ Hx Sarcoid> on Mucinex & MMW prn; denies cough, sputum, hemoptysis, ch in dyspnea, chest discomfort; she has SOB/DOE but is too sedentary & needs to incr exercise program...    HBP/ CAD> Followed by DrTTurner on ASA81, Imdur120, ToprolXL75, Cardizem240, Lasix20;  BP= 110/68 & denies CP, palpit, syncope, SOB, edema; she reports check up 5/13 (we don't have note) & they added RANEXA 500m25md for her CP...    CHOL> on Lip40, Zetia10, CoQ10; she has lost 3# on diet down to 183# today; FLP is followed by DrTTurner per pt but we don't have  copies; pt reports concern for "particle size" & she was rec to enter into a drug trial...    DM> Now followed by [name] on Metform500Bid & Onglyza2.5; last A1c here= 6.7; we don't have notes from DrKeBurke Centree reports doing ok...    GI> GERD, IBS> on Protonix40; denies abd pain, n/v, d/c/blood...    Anxiety> on Xanax0.5mg 57m, she was INTOL to Zoloft & on Lexapro10mg 67mec to incr to 20mg t55m... She saw DrDillard for GYN 5/13> mult menopausal symptoms & she was started on Prempro...  LABS 7/13 by [name]> FLP- at goals on Lip+Zetia;  Chems- wnl;  CBC- Hg=12.4, Ferritin low at 14.9, B12= 211; TSH=1.53;   ~  December 04, 2012:  78mo ROV74moenise has mult somatic complaints and they all seem to revolve around not resting well & aching/ sore/ tender in chest wall; we reviewed FM diagnosis which she has carried for yrs and prev saw [name]- Rec trial Lunesta 2mgQhs &65mamadol Tid prn... We reviewed the following medical problems during today's office visit >>     AR/ AB/ Hx Sarcoid> on Mucinex & MMW prn; denies cough, sputum, hemoptysis, ch in dyspnea- she has SOB/DOE but is too sedentary & needs to incr exercise program; her chest discomfort sounds like CWP from  her FM & we discussed FM rx below...    HBP/ CAD> on ASA81, Imdur120, ToprolXL75, Cardizem240, Lasix20prn (seldom takes); BP=128/80 & she has mult somatic c/o- CWP, tired/no energy, but denies palpit, ch in SOB, edema, etc; followed by [facility]- DrTurner/ [name] & seen recently but we don't have note; they prev added RANEXA 558m Bid for her CP (now off this med)...    CHOL> on Lip40, Zetia10, CoQ10; she has gained 4# on diet down to 186# today; FLP is followed by [facility] per pt- pt reports concern for "particle size" & she was rec to enter into a drug trial; last FLP 7/13 showed TChol 137, TG 89, HDL 42, LDL 73...    DM> followed by [name] on Metform500Bid & Onglyza2.5; last labs from [facility] showed BS=201, A1c=6.0; he rec same  meds...    GI> GERD, IBS> on Protonix40; denies abd pain, n/v, d/c/blood...    FM> This is her CC w/ not resting well, wakes tired, poor energy, aching/ sore/ tender (esp in chest in AM) & we discussed trial Lunesta 244mhs, & Tramadol 5016md; plus rest, heat, etc... States she's INTOL Ambien w/ weird dreams & Benedryl/ Melatonin w/o help...    Anxiety> on Xanax0.5mg54mn, she was INTOL to Zoloft & on Lexapro10mg23mrec to incr to 20mg 13my...    MISC> she tells me that [name] treated her low VitD w/ 50K weekly, then switched to daily OTC supplement; she says he wants me to address her other labs that he did 7/13- Ferritin sl low at 14.9 (Rec FeSO4 325mg/d10m12 borderline low at 211 (Rec OTC Vit B12 1000mg da80m & we will f/u labs later... We reviewed prob list, meds, xrays and labs> see below for updates >> LABS per [name], [facility]...  CXR 1/14 showed cardiomeg, clear lungs, no adenopathy, mild scoliosis...   ~  August 08, 2013:  21mo ROV 31monise haTamiyahral issues to discuss> 1) c/o sinus congestion- unresponsive to her OTC meds & rec to use Nasal saline mist, Mucinex 1200mgBid, 63mds, and we will treat w/ ZPak & Depo80;  2) told by [name] that Iron was low & needed f/u here but we don't have these labs from him- repeat here today shows Hg=12.5, MCV=88, Fe=47 (12%sat), Ferritin=10.8; she is due for f/u colon w/ [name] & we will refer, rec to start FeSO4 325mg Bid w26mtC500 w/ plans for f/u CBC, Fe studies in 2-30mo;  She c70monues to f/u w/ [name] regularly- We reviewed the following medical problems during today's office visit >>     AR/ AB/ Hx Sarcoid> on Mucinex & MMW prn; denies cough, sputum, hemoptysis, or ch in dyspnea- she has SOB/DOE but is too sedentary & needs to incr exercise program; some CWP from her FM & we discussed FM rx below...    HBP/ CAD> on ASA81, Imdur120, Toprol50-1/2Bid, Cardizem240, Lasix20prn (seldom takes) & ?Amlodip5 from [facility]?; BP=13[employer] & she has mult  somatic c/o- CWP, tired/no energy, but denies palpit, ch in SOB, edema, etc; [facility]/ [name], no recent notes.    CHOL> on Lip40 => notes from [facility] says ?[place] [employer] +[employer] study drug?; she has gained 4# to 190# today; [facility] & she reports concern for "particle size" & last avail FLP was 7/14 by [name] w/ TChol 190, HDL 100, LDL 40    DM> followed by [name] on Metform500Bid & Onglyza2.5 => ?he changed to Kombiglyze5-1000 daily?; last  labs from [place] 7/14 showed BS=105, A1c=6.5; and she remains on the same meds...    GI> GERD, IBS> on Protonix40; denies abd pain, n/v, d/c, or blood seen; last colonoscopy was 7/03 by [name] and reported wnl; f/u due now esp in light of her low Ferritin level...    FM> This is her CC w/ not resting well, wakes tired, poor energy, aching/ sore/ tender (esp in chest in AM) & we discussed the importance of a good night's rest, plus Tramadol 83mTid; States she's INTOL Ambien w/ weird dreams & Benedryl/ Melatonin w/o help...    Anxiety> on Xanax0.523mPrn, she was INTOL to Zoloft & she stopped prev Lexapro Rx...    Vit D defic> she tells me that [name] treated her low VitD w/ 50K weekly, then switched to daily OTC supplement...    Vit B12 defic> [name] has her on B12 shots (weekly x4, then monthly) for B12 level done 8/14 = 154...    Borderline anemia & low Ferritin> pt reports that labs from DrMillerhowed this & he wanted usK[place] eval & treat; Labs 9/14 showed Hg= 12.5, MCV=88, Fe=47 (12%sat), Ferritin=10.8; she is due for f/u colon w/ [name] & we will refer, rec to start FeSO4 32545mid w/ VitC500 w/ plans for f/u CBC, Fe studies in 2-61mo70moWe reviewed prob list, meds, xrays and labs> see below for updates >> SHE DID NOT BRING MED BOTTLES OR CURRENT MED LIST TO THE OV TODAY!!! She is not clear on wat meds have been changed by [name] etalHarriett [name] ~  February 13, 2014:  103mo 6103mo& Jocelyn [name] has mult somatic complaints-  recent gastroenteritis (on Zofran & Immodium), c/o swelling all over "they don't know what it is" states she watches salt & exercises by walking 3-4d/wk... We reviewed the following medical problems during today's office visit >>     AR/ AB/ Hx Sarcoid> on Mucinex & MMW prn; denies cough, sputum, hemoptysis, or ch in dyspnea- she has SOB/DOE but is too sedentary & needs to incr exercise program; some CWP from her FM & we discussed FM rx below...    HBP/ CAD> on ASA81, Toprol50-1/2Bid, Cardizem240, Lasix20prn (ave 1/d) & ?Amlodip5 from [facility]?[employer]=124/76 & she has mult somatic c/o- CWP, tired/no energy, but denies palpit, ch in SOB, edema, etc; followed by [facility]- [name], no recent notes.    CHOL> off Lipitor => notes from [facility] [employer] ?Lip80 +[employer] study drug? (both stopped due to elev CPK); she has gained 8# to 198# today; [facility] & she reports concern for "particle size" & last avail FLP was 7/14 by [name] w/ TChol 190, HDL 100, LDL 40...    DM> followed by [name] on Metform500Bid & Onglyza5 => ?he changed to Kombiglyze5-1000 daily?; [place]=105, A1c=6.5; and she remains on the same meds...    GI> GERD, IBS> on Protonix40; denies abd pain, n/v, d/c, or blood seen; last colonoscopy was 7/03 by [name] and reported wnl; f/u due now esp in light of her low Ferritin level...    FM> This is her CC w/ not resting well, wakes tired, poor energy, aching/ sore/ tender (esp in chest in AM) & we discussed the importance of a good night's rest- states she's INTOL Ambien w/ weird dreams & Benedryl/ Melatonin w/o help...    Anxiety> on Xanax0.5mg P29m she was INTOL to Zoloft & she stopped prev Lexapro Rx...    Vit D  defic> she tells me that [name] treated her low VitD w/ 50K weekly, then switched to daily OTC supplement...    Vit B12 defic> [name] has her on B12 shots (weekly x4, then monthly) for B12 level done 8/14 = 154...    Borderline anemia &  low Ferritin> pt reports that labs from [place] showed this & he wanted [name] to eval & treat; Labs 9/14 showed Hg= 12.5, MCV=88, Fe=47 (12%sat), Ferritin=10.8; she is due for f/u colon w/ [name] & we will refer, rec to start FeSO4 $RemoveBef'325mg'adhtOeugGo$  Bid w/ VitC500 w/ plans for f/u CBC- done 4/15 w/ Hg=12.5, Fe=31 (8%sat) but only taking Iron once daily; rec to increase to Bid... We reviewed prob list, meds, xrays and labs> see below for updates >>   CXR 4/15 showed mild cardiomeg, clear lungs, NAD...  LABS 4/15:  Chems- wnl;  CBC- ok w/ Hg=12.5 but Fe=31 (8%);  TSH=1.62;  ACE=56 (8-52);  BNP=26...          Problems List:  MENIERE'S DISEASE (ICD-3 - eval by ENT, DrBates- Rx w/ diuretic, low salt, Xanax vs Valium, & Antivert Prn... dizziness recurred & ENT sent her to [facility] 7/11- we don't have notes but pt reports that nothing was found.  ALLERGIC RHINITIS - increased allergy symptoms in the spring... rec> Claritin, Saline, Flonase...  Hx of ASTHMATIC BRONCHITIS, ACUTE  - requires occas Antibiotics and Medrol for infectious exac- last Oct09 & resolved w/ Avelox/ Depo/ Medrol... has not been on regular inhalers, but uses XOPENEX MDI (w/ spacer), [place] Prn... ~  essentially neg CTChest 9/08 after CXR suggested RULnodule. ~  CXR 10/09 showed cardiomegaly, clear lungs, min scoliosis, NAD. ~  CXR 9/11 showed cardiomeg, clear lungs, NAD.[name]. ~  CXR 1/14 showed cardiomeg, clear lungs, no adenopathy, mild scoliosis... ~  CXR 4/15 showed mild cardiomeg, clear lungs, NAD...  Hx of SARCOIDOSIS  - Dx'd 1980's w/ bronch bx... Rx Pred transiently and improved... no active disease x years... ~  baseline CXRs showed cardiomegaly, clear lungs, min scoliosis, NAD.  Hx of Mild OBSTRUCTIVE SLEEP APNEA - sleep eval DrClance 2006 w/ RDI= 5 only.  HYPERTENSION, BENIGN  - on TOPROL XL $RemoveB'50mg'hcexMGwv$ - 1.5 tabs daily, CARDIZEM $RemoveBeforeD'240mg'DfkuqcjJQywKRA$ /d, LASIX $RemoveB'20mg'iOJsYcvz$  just as needed now... ~  prev OV's 130-140's / 80's... prev noted sl HA, chest  tightness, sl SOB, & mult somatic complaints... Labs & renal- all WNL. ~  4/12:  BP=122/76 today, and similar at home per pt... She denies CP, palpit, SOB, edema, etc... Labs & renal- all WNL. ~  1/14: on ASA81, Imdur120, ToprolXL75, Cardizem240, Lasix20prn (seldom takes); BP=128/80 & she has mult somatic c/o- CWP, tired/no energy, but denies palpit, ch in SOB, edema, etc; followed by [facility]- DrTurner/ [name] & seen recently but we don't have note; they prev added RANEXA $RemoveBefo'500mg'zWGsWQZXfUk$  Bid for her CP (now off this med); we discussed trial Lunesta $RemoveBefor'2mg'IyFzjzXhZXOs$ Qhs for sleep & Tramadol $RemoveBe'50mg'KMakNspqd$ Tid for pain... ~  9/14:  on ASA81, Imdur120, Toprol50-1/2Bid, Cardizem240, Lasix20prn (seldom takes); BP=132/74 & she has mult somatic c/o- CWP, tired/no energy, but denies palpit, ch in SOB, edema, etc; followed by [facility]- DrTurner/ [name], no recent notes ~  4/15:  on ASA81, Toprol50-1/2Bid, Cardizem240, Lasix20prn (ave 1/d) & ?Amlodip5 from [employer]?; BP=124/76 & she has mult somatic c/o- CWP, tired/no energy, but denies palpit, ch in SOB, edema, etc; followed by [facility]- [name], no recent notes.  CORONARY ARTERY DISEASE - on ASA $Remo'81mg'bgKek$ /d, IMDUR $RemoveB'120mg'BKVaLQwN$ /d, Toprol XL, Cardizem, Lasix as above.Marland KitchenMarland Kitchen  Followed by DrTTurner. ~  Cath in 2000 showed non-obstructive CAD (20-30% lesions in all 3 vessels) w/ rec for risk factor modification.   ~  NuclearStressTest 4/07 showed no ischemia & EF=73%. ~  Mar10:  she's concerned about BP and chest tightness, requests Cardiac eval DrTurner & we will refer... ~  2DEcho 4/10 showed norm LVF w/ EF=55-60%, norm MV, norm AoV, Gr1DD ~  4/10 eval by DrTurner w/ MYOVIEW- neg- breast attenuation, no regional wall motion abn, EF= 75% ~  4/10 Cath by DrTurner w/ 90% small 2nd diag branch of LAD <too sm for PTCA> & luminal irreg up to 20% in RCA, +DD- Med Rx. ~  She had more chest tightness & another Myoview from DrTurner 8/12- it too was neg, no ischemia, no infarct; +breast attenuation; EF=70% &  nno regional wall motion abnormalities;  IMDUR has been incr to $Remov'120mg'FECJgo$ /d. ~  1/14: she tells me [facility] switched [facility] to [facility] but we don't have notes from him> EKG sent showed NSR, rate72, wnl, NAD... ~  EKG 5/14 showed NSR, rate 93, WNL, NAD... ~  12/14: she saw [name] for Cards f/u (note reviewed)> HBP, CAD, Chol; off Imdur w/o angina, continue same meds including both Cardizem & Amlodipine, encouraged to continue [employer] study...  VENOUS INSUFFICIENCY - she was referred to the [facility] foot clinic by DrWWoods and seen by DrSevier 6/08= ven insuffic Rx w/ TED's, no salt, elevation, etc. ~  10/10: notes bilat ankle ?lipoma in front of the lat malleoli- asymptomatic but several people have noticed them and she request refer to Ortho (rec DrBednarz when she is ready).  HYPERCHOLESTEROLEMIA  - on LIPITOR $RemoveBe'40mg'ZVrKrexzX$ /d & ZETIA $Remov'10mg'cRKCkF$ /d + CoQ10 per DrTurner... Diet & exercise stressed to the pt. ~  FLP here 12/07 on Lip10 showed TChol 156, TG 94, HDL 51, LDL 86 ~  FLP 1/09 on Lip10 showed TChol 157, TG 122, HDL 49, LDL 84 ~  FLP at [place] study 5/09 on Lip10 showed TChol 176, TG 116, HDL 55, LDL 98 ~  FLP 3/10 on Lip10 showed TChol 170, TG 94, HDL 59, LDL 93 ~  FLP 6/10 by DrTurner on Lip20 showed TChol 134, TG 82, HDL 55, LDL 73 ~  FLP 4/11 here on Lip20+Zetia? showed TChol 145, TG 74, HDL 68, LDL 63 ~  9/11:  she reports that DrTurner incr Lipitor to $RemoveBe'40mg'aqAqTstyd$ , plus the Zetia $RemoveB'10mg'IeoeoExi$ , and she does her FLPs. ~  FLP here 4/12 on Lip40+Zetia showed TChol 153, TG 54, HDL 53, LDL 90... Copy sent to DrTurner ~  [place] by [name] 7/13 on Lip40+Zetia10 showed TChol 137, TG 89, HDL 42, LDL 73 ~  9/14: on Lip40 => notes from [place] says ?Lip80 +[employer] study drug?; she has gained 4# to 190# today; FLP is followed by [facility] per pt & she reports concern for "particle size" & last avail FLP was 7/14 by [name] w/ TChol 190, HDL 100, LDL 40  DIABETES MELLITUS  - on Metformin $RemoveBefo'500mg'OCiaRwdiAmC$ Bid + PARLODEL 2.$RemoveBefo'5mg'FTvVGxeJUZj$ /d per DrEllison... She is  intol to Actos w/ edema, she stopped Januvia due to ?side effect, she wondered about Tajenta since her daugh works for [employer]. ~  labs 9/08 showed FBS=119, HgA1c=6.6 ~  labs 5/09 at [facility] study showed BS= 108, HgA1c= 6.5 ~  labs 3/10 showed BS= 113, A1c= 6.6 ~  labs 6/10 by DrTurner showed BS= 92; and 3/11 BS= 127 ~  labs 4/11 showed BS= 124, A1c= 7.6.[name]. rec> incr Metform to Bid; she had  [facility] at [facility]... ~  6/11:  pt requested Endocrine consult & seen by DrEllison w/ Januvia 100mg /d added. ~  labs 9/11 showed BS= 98, A1c= 7.3.Marland KitchenMarland Kitchen she stopped Januvia due to side effects & wonders about using Tajenta instead. ~  DrEllison started Bromocyptine 2.5mg /d... She is now taking this +Metform 500mg Bid... ~  Labs 4/12 (wt=195#) showed Bs= 116, A1c= 7.1 ~  Labs 7/12 showed A1c= 7.2;  Umicroalb= 3.5 ~  Labs 10/12 (wt=186#) showed A1c= 6.7 NOTE: She participated in a [facility] trial called the AfricanAmerican- Diabetes Heart Study in 2012; they sent a report indicating: 1) her memory was fine, but her MRI Brain showed sm vessel dis & mild sinus dis, otherw neg; 2) she may have treatable depression but we tried her on Zoloft & she was intol==> ch to Lexapro. ~  followed by [name] on Metform500Bid & Onglyza2.5; labs from [place] 9/13 showed BS=201, A1c=6.0; he rec same meds... ~  9/14: followed by [name] on Metform500Bid & Onglyza2.5 => ?he changed to Kombiglyze5-1000 daily?; last labs from [facility] 7/14 showed BS=105, A1c=6.5; and she remains on the same meds... ~  1/15: she had f/u [name] for endocrine> DM, Obesity, Adrenal adenoma, VitB12 defic; labs reviewed, note reviewed- he tried Belviq but she was intol...  GERD SYMPTOMS - pt placed on PROTONIX 40mg /d w/ improvement in reflux symptoms...  IRRITABLE BOWEL SYNDROME  - colonoscopy 7/03 by [name] was WNL. ~  9/14: she was referred to GI for f/u colon but never did it... ~  4/15: we will refer her to GI again...  RENAL CALCULUS  - sm stone  seen in L kidney on CTScan... she had lithotripsy by [facility] ~9/09.  FIBROMYALGIA  - prev Rx by [name] in 2006... ~  1/14: This is her CC w/ not resting well, wakes tired, poor energy, aching/ sore/ tender (esp in chest in AM) & we discussed trial Lunesta 2mg Qhs, & Tramadol 50mg Tid; plus rest, heat, etc... States she's INTOL Ambien w/ weird dreams & Benedryl/ Melatonin w/o help... ~  11/14: she went to the ER w/ LBP> felt to be a lumbar strain...  VITAMIN D DEFICIENCY - Vit D level was 30 at [facility] study in fall 2009... supplemented w/ OTC Vit D 1000 u daily. ~  Vit D level was lower from [name] & he treated w/ Vit D 50K weekly for awhile, then switched to OTC supplement...  ANXIETY - on ALPRAZOLAM 0.5mg  Prn... DEPRESSION - prev on Lexapro 20mg /d but she stopped on her own ~10/13 ?cost issues? ~  4/12: c/o feeling sad & anxious all the time- weak, not resting well, under a lot of stress; rec to seek counselling thru her local church or let us refer to [facility], & start RX w/ Zoloft 50==> but she was INTOL... ~  2013: switched to Lexapro & seems to be doing better but didn't stick w/ it due to cost issues...  ANEMIA & LOW FERRITIN LEVEL >>  B12 DEFICIENCY >> treated by [name] w/ B12 shots ~  9/14: she tells me that Ferritin is low & [name] tried her on Oral Fe prep w/o improvement & wants me to f/u and treat this problem; we discussed need to recheck labs here- LABS 9/14 showed Hg=12.5, MCV=88, Fe=47 (12%sat), Ferritin=10.8; she is due for f/u colon w/ [name] & we will refer, rec to start FeSO4 325mg  Bid w/ VitC500 w/ plans for f/u CBC, Fe studies in 2-62mo; ~  4/15:  Labs showed Hg= 12.5 but Fe=31 (8%);  she was only taking the Fe once daily 7 advised to incr to Bid w/ vitC500; needs f/u colon & we will refer to [name] again...  HEALTH MAINTENANCE: ~  GI:  Followed by [name] & colon due 7/13... ~  GYN:  Followed by DrDillard & she gets yearly PAP, Mammogram, hasn't had baseline BMD  yet, started on [facility] 5/13... ~  Immunizations:  She gets the yearly Flu shots at school;  Had PNEUMOVAX previously; TDAP given 4/12...   No past surgical history on file - other than Lithotripsy for renal stone...   Outpatient Encounter Prescriptions as of 02/13/2014  Medication Sig  . ALPRAZolam (XANAX) 0.5 MG tablet take 1/2 tablet by mouth MID MORNING AND MID AFTERNOON, and 1 tablet by mouth at bedtime  . amLODipine (NORVASC) 5 MG tablet Take 5 mg by mouth daily.   [name] aspirin 81 MG tablet Take 81 mg by mouth daily.    . Cyanocobalamin 1000 MCG/ML KIT Inject 1,000 mg as directed every 30 (thirty) days.  [name] diltiazem (CARDIZEM CD) 240 MG 24 hr capsule take 1 capsule by mouth once daily  . estrogen, conjugated,-medroxyprogesterone (PREMPRO) 0.3-1.5 MG per tablet Take 1 tablet by mouth daily.  . ferrous sulfate 325 (65 FE) MG tablet Take 325 mg by mouth daily with breakfast.  . furosemide (LASIX) 20 MG tablet Take 20 mg by mouth daily as needed.   . meclizine (ANTIVERT) 25 MG tablet Take 25 mg by mouth. 1/2 to 1 tab by mouth every 4 hours as needed for dizziness   . metoprolol tartrate (LOPRESSOR) 25 MG tablet Take 1 tablet (25 mg total) by mouth 2 (two) times daily.  . nitroGLYCERIN (NITROSTAT) 0.4 MG SL tablet Place 1 tablet (0.4 mg total) under the tongue every 5 (five) minutes as needed for chest pain.  . pantoprazole (PROTONIX) 40 MG tablet Take 1 tablet (40 mg total) by mouth daily.  . Saxagliptin-Metformin (KOMBIGLYZE XR) 03-999 MG TB24 Take 1 tablet by mouth once.  . sodium chloride (OCEAN NASAL SPRAY) 0.65 % nasal spray 1 spray by Nasal route as needed.    . valACYclovir (VALTREX) 500 MG tablet Take 500 mg by mouth daily.   . [DISCONTINUED] methylPREDNISolone (MEDROL DOSEPAK) 4 MG tablet follow package directions  . [DISCONTINUED] STUDY MEDICATION Evacetrapib (CETP inhibitor) lipid lowering study with [employer]  . [DISCONTINUED] traMADol (ULTRAM) 50 MG tablet Take 1 tablet (50 mg  total) by mouth 3 (three) times daily as needed.    Allergies  Allergen Reactions  . Codeine     REACTION: vomiting  . Morphine     REACTION: vomiting  . Pioglitazone     REACTION: pt states INTOL w/ edema  . Sitagliptin Phosphate     REACTION: nausea    Current Medications, Allergies, Past Medical History, Past Surgical History, Family History, and Social History were reviewed in [employer] record.   Review of Systems        See HPI - all other systems neg except as noted...      The patient complains of weight gain, dyspnea on exertion, and peripheral edema.  The patient denies anorexia, fever, weight loss, vision loss, decreased hearing, hoarseness, chest pain, syncope, prolonged cough, headaches, hemoptysis, abdominal pain, melena, hematochezia, severe indigestion/heartburn, hematuria, incontinence, genital sores, suspicious skin lesions, transient blindness, difficulty walking, depression, unusual weight change, abnormal bleeding, enlarged lymph nodes, and angioedema.     Objective:   Physical Exam     WD, Overweight, 58  y/o BF in NAD... GENERAL:  Alert & oriented; pleasant & cooperative. HEENT:  [place]/AT, EOM-wnl, PERRLA, EACs-clear, TMs-wnl, NOSE-clear, THROAT-clear & wnl. NECK:  Supple w/ fairROM; no JVD; normal carotid impulses w/o bruits; no thyromegaly or nodules palpated; no lymphadenopathy. CHEST:  Clear to P & A; without wheezes/ rales/ or rhonchi heard; sl tender on palpation of chest wall. HEART:  Regular Rhythm; without murmurs/ rubs/ or gallops. ABDOMEN:  Soft & nontender; normal bowel sounds; no organomegaly or masses detected. EXT: without deformities, mild arthritic changes; no varicose veins but +venous insuffic & tr edema; +trigger points. NEURO:  CN's intact;  no focal neuro deficits... DERM:  No lesions noted; no rash etc...  RADIOLOGY DATA:  Reviewed in the EPIC EMR & discussed w/ the patient...  LABORATORY DATA:  Reviewed in the  EPIC EMR & discussed w/ the patient...   Assessment & Plan:    RESP>  Hx AR, Asthma, old sarcoid, mild OSA>  All stable, breathing OK, but she notes some wheezing w/ exercise;  On Mucinex & asked to use her Xopenex prior to exercise which she didn't do; we could consider a leukotriene receptor modifier in the future if nec... CXR remains stable/ NAD...   CARDS>  Followed by DrTTurner/ [place] [facility] ?on meds listed? SHE DID NOT BRING MEDS TO THE OV> Hx HBP, CAD, VI, etc;  Chest pain is non-cardiac & likely related to her FM; Doing satis & we reviewed exercise program etc...  CHOL>  Managed by [name] for Cards- off Lip + off Zetia; ? Taking a study drug from [place]...  DM>  Followed by [name] on Metformin, Onglyzal; A1c improved to 6.7 in 2012, then 6.0 in 2013, & 6.5  In 2014;  rec continue meds & asked to have records sent to [name]...  GI>  Stable on Protonix as needed...  FM>  She has long standing FM which appears to have flaired up recently- not resting, no energy, aches & pains, etc; we discussed getting proper sleep- she is intol to [place]; on Tramadol50 prn pain...  Anxiety>  She remains on Alprazolam as needed; & she stopped the Lexapro..  Anemia, Low Ferritin> on FeSO4 $Remove'325mg'[id number]$  & VitC500, needs to incr meds to Bid & f/u w/ GI for colonoscopy...   Patient's Medications  New Prescriptions   LOSARTAN (COZAAR) 50 MG TABLET    Take 1 tablet (50 mg total) by mouth daily.   ONDANSETRON (ZOFRAN) 4 MG TABLET    Take 1 tablet (4 mg total) by mouth every 8 (eight) hours as needed for nausea or vomiting.  Previous Medications   ALPRAZOLAM (XANAX) 0.5 MG TABLET    take 1/2 tablet by mouth MID MORNING AND MID AFTERNOON, and 1 tablet by mouth at bedtime   ASPIRIN 81 MG TABLET    Take 81 mg by mouth daily.     CYANOCOBALAMIN 1000 MCG/ML KIT    Inject 1,000 mg as directed every 30 (thirty) days.   DILTIAZEM (CARDIZEM CD) 240 MG 24 HR CAPSULE    take 1 capsule by mouth once daily    ESTROGEN, CONJUGATED,-MEDROXYPROGESTERONE (PREMPRO) 0.3-1.5 MG PER TABLET    Take 1 tablet by mouth daily.   FERROUS SULFATE 325 (65 FE) MG TABLET    Take 325 mg by mouth 2 (two) times daily with a meal.    MECLIZINE (ANTIVERT) 25 MG TABLET    Take 25 mg by mouth. 1/2 to 1 tab by mouth every 4 hours as needed for dizziness  METOPROLOL TARTRATE (LOPRESSOR) 25 MG TABLET    Take 1 tablet (25 mg total) by mouth 2 (two) times daily.   NITROGLYCERIN (NITROSTAT) 0.4 MG SL TABLET    Place 1 tablet (0.4 mg total) under the tongue every 5 (five) minutes as needed for chest pain.   PANTOPRAZOLE (PROTONIX) 40 MG TABLET    Take 1 tablet (40 mg total) by mouth daily.   SAXAGLIPTIN-METFORMIN (KOMBIGLYZE XR) 03-999 MG TB24    Take 1 tablet by mouth once.   SODIUM CHLORIDE (OCEAN NASAL SPRAY) 0.65 % NASAL SPRAY    1 spray by Nasal route as needed.     VALACYCLOVIR (VALTREX) 500 MG TABLET    Take 500 mg by mouth daily.    VITAMIN C (ASCORBIC ACID) 500 MG TABLET    Take 500 mg by mouth 2 (two) times daily. With the iron tablets  Modified Medications   Modified Medication Previous Medication   FUROSEMIDE (LASIX) 20 MG TABLET furosemide (LASIX) 20 MG tablet      Take 2 tablets by mouth every morning    Take 20 mg by mouth daily as needed.   Discontinued Medications   AMLODIPINE (NORVASC) 5 MG TABLET    Take 5 mg by mouth daily.    METHYLPREDNISOLONE (MEDROL DOSEPAK) 4 MG TABLET    follow package directions   STUDY MEDICATION    Evacetrapib (CETP inhibitor) lipid lowering study with [employer]   TRAMADOL (ULTRAM) 50 MG TABLET    Take 1 tablet (50 mg total) by mouth 3 (three) times daily as needed.

## 2014-02-13 NOTE — Patient Instructions (Addendum)
Today we updated your med list in our EPIC system...    We decided to STOP the NORVASC (Amlodipine) due to your edema...  We are adding COZAAR (Losartan) 50mg  one tab daily in it's place to help w/ the swelling and BP etc...  Continue the LASIX (Furosemide) 20mg  tabs- 2 tabs each AM til we seen you back in about 2mo...  Keep up the great work w/ [employer] and exercise...  Remember> no salt, elev the legs when poss and wear support hose...  Today we did your follow up CXR & blood work...    We will contact you w/ the results when available...   Call for any questions.Marland KitchenMarland Kitchen

## 2014-02-14 ENCOUNTER — Telehealth: Payer: Self-pay | Admitting: Pulmonary Disease

## 2014-02-14 ENCOUNTER — Ambulatory Visit (INDEPENDENT_AMBULATORY_CARE_PROVIDER_SITE_OTHER)
Admission: RE | Admit: 2014-02-14 | Discharge: 2014-02-14 | Disposition: A | Discharge status: Home / Self Care | Payer: BC Managed Care – PPO | Source: Ambulatory Visit | Attending: Pulmonary Disease | Admitting: Pulmonary Disease

## 2014-02-14 DIAGNOSIS — D869 Sarcoidosis, unspecified: Secondary | ICD-10-CM

## 2014-02-14 LAB — IBC PANEL
Iron: 31 ug/dL — ABNORMAL LOW (ref 42–145)
Saturation Ratios: 8.2 % — ABNORMAL LOW (ref 20.0–50.0)
TRANSFERRIN: 269.2 mg/dL (ref 212.0–360.0)

## 2014-02-14 LAB — BASIC METABOLIC PANEL
BUN: 11 mg/dL (ref 6–23)
CALCIUM: 8.9 mg/dL (ref 8.4–10.5)
CO2: 26 mEq/L (ref 19–32)
CREATININE: 0.7 mg/dL (ref 0.4–1.2)
Chloride: 107 mEq/L (ref 96–112)
GFR: 103.2 mL/min (ref >60.00)
Glucose, Bld: 98 mg/dL (ref 70–99)
Potassium: 4.2 mEq/L (ref 3.5–5.1)
SODIUM: 142 meq/L (ref 135–145)

## 2014-02-14 LAB — HEPATIC FUNCTION PANEL
ALK PHOS: 82 U/L (ref 39–117)
ALT: 25 U/L (ref 0–35)
AST: 29 U/L (ref 0–37)
Albumin: 3.6 g/dL (ref 3.5–5.2)
BILIRUBIN DIRECT: 0.1 mg/dL (ref 0.0–0.3)
BILIRUBIN TOTAL: 0.5 mg/dL (ref 0.3–1.2)
Total Protein: 6.9 g/dL (ref 6.0–8.3)

## 2014-02-14 LAB — BRAIN NATRIURETIC PEPTIDE: Pro B Natriuretic peptide (BNP): 26 pg/mL (ref 0.0–100.0)

## 2014-02-14 LAB — TSH: TSH: 1.62 u[IU]/mL (ref 0.35–5.50)

## 2014-02-14 NOTE — Telephone Encounter (Signed)
Pt came by to let us know that the rx we called in for her Zofran was incorrect. The rx that was called in was for the tablet and she needs the sublingual tablet. I could not find this medication in Epic. Verbal has been given to the pharmacist at [facility].

## 2014-03-04 ENCOUNTER — Other Ambulatory Visit: Payer: Self-pay | Admitting: Pulmonary Disease

## 2014-03-04 DIAGNOSIS — K219 Gastro-esophageal reflux disease without esophagitis: Secondary | ICD-10-CM

## 2014-03-18 ENCOUNTER — Encounter: Payer: Self-pay | Admitting: Pulmonary Disease

## 2014-03-18 ENCOUNTER — Ambulatory Visit (INDEPENDENT_AMBULATORY_CARE_PROVIDER_SITE_OTHER): Payer: BC Managed Care – PPO | Admitting: Pulmonary Disease

## 2014-03-18 ENCOUNTER — Encounter: Payer: Self-pay | Admitting: *Deleted

## 2014-03-18 VITALS — BP 134/80 | HR 64 | Temp 97.7°F | Ht 62.0 in | Wt 197.4 lb

## 2014-03-18 DIAGNOSIS — D509 Iron deficiency anemia, unspecified: Secondary | ICD-10-CM

## 2014-03-18 DIAGNOSIS — E119 Type 2 diabetes mellitus without complications: Secondary | ICD-10-CM

## 2014-03-18 DIAGNOSIS — F411 Generalized anxiety disorder: Secondary | ICD-10-CM

## 2014-03-18 DIAGNOSIS — K219 Gastro-esophageal reflux disease without esophagitis: Secondary | ICD-10-CM

## 2014-03-18 DIAGNOSIS — I10 Essential (primary) hypertension: Secondary | ICD-10-CM

## 2014-03-18 DIAGNOSIS — I872 Venous insufficiency (chronic) (peripheral): Secondary | ICD-10-CM

## 2014-03-18 DIAGNOSIS — IMO0001 Reserved for inherently not codable concepts without codable children: Secondary | ICD-10-CM

## 2014-03-18 DIAGNOSIS — R609 Edema, unspecified: Secondary | ICD-10-CM

## 2014-03-18 DIAGNOSIS — E782 Mixed hyperlipidemia: Secondary | ICD-10-CM

## 2014-03-18 DIAGNOSIS — I2510 Atherosclerotic heart disease of native coronary artery without angina pectoris: Secondary | ICD-10-CM

## 2014-03-18 DIAGNOSIS — K589 Irritable bowel syndrome without diarrhea: Secondary | ICD-10-CM

## 2014-03-18 DIAGNOSIS — D869 Sarcoidosis, unspecified: Secondary | ICD-10-CM

## 2014-03-18 MED ORDER — ALPRAZOLAM 0.5 MG PO TABS: ORAL_TABLET | ORAL | Status: DC

## 2014-03-18 MED ORDER — PANTOPRAZOLE SODIUM 40 MG PO TBEC: 40.0000 mg | DELAYED_RELEASE_TABLET | Freq: Every day | ORAL | Status: DC

## 2014-03-18 NOTE — Progress Notes (Addendum)
Subjective:    Patient ID: Jocelyn [name] [name], female    DOB: 02-14-1956, 58 [name]   MRN: [id number]  HPI 47 [name]/o BF here for a follow up visit... she has multiple medical problems including:  AR & Asthma;  Hx Sarcoid;  Mild OSA;  HBP;  CAD;  Ven Insuffic;  Hyperchol;  DM;  GERD/ IBS;  Hx Kidney stones;  FM;  Vit D defic;  Anxiety...  ~  September 08, 2011:  54moROV & she has been reasonably stable, no new complaints or concerns;     AR/ AB/ Hx Sarcoid> on Xopenex Prn, Mucinex & MMW; denies cough, sputum, hemoptysis, ch in dyspnea, chest discomfort...    HBP/ CAD> Followed by Jocelyn [name] [name] on ASA81, Imdur120, ToprolXL75, Cardizem240, Lasix20; BP= 120/80, P= 78/reg; denies CP, palpit, syncope, SOB, edema; she reports check up 7/12 (we don't have note) & doing fine, no changes made...    CHOL> on Lip40, Zetia10, CoQ10; she has lost 9# on diet down to 186# today; FLP here 4/12 was wnl & reviewed w/ pt...    DM> Followed by Jocelyn [name] [name] on Metform500Bid, Onglyza2.5, Parlodel2.5hs; A1c= 7.2 in July and repeated today at her request= 6.7; despite this good control she not happy w/ her care & requests second opinion from [place] refer...    GI> GERD, IBS> on Protonix40; denies abd pain, n/v, d/c/blood...    Anxiety> on Xanax0.551mPrn, she was INTOL to Zoloft...  ~  June 02, 2012:  55m655moV & Jocelyn [name] [name] says she is doing well w/o new complaints or concerns...    AR/ AB/ Hx Sarcoid> on Mucinex & MMW prn; denies cough, sputum, hemoptysis, ch in dyspnea, chest discomfort; she has SOB/DOE but is too sedentary & needs to incr exercise program...    HBP/ CAD> Followed by Jocelyn [name] [name] on ASA81, Imdur120, ToprolXL75, Cardizem240, Lasix20;  BP= 110/68 & denies CP, palpit, syncope, SOB, edema; she reports check up 5/13 (we don't have note) & they added RANEXA 500m25md for her CP...    CHOL> on Lip40, Zetia10, CoQ10; she has lost 3# on diet down to 183# today; FLP is followed by Jocelyn [name] [name] per pt but we don't have  copies; pt reports concern for "particle size" & she was rec to enter into a drug trial...    DM> Now followed by Jocelyn [name] on Metform500Bid & Onglyza2.5; last A1c here= 6.7; we don't have notes from DrKeBurke Centree reports doing ok...    GI> GERD, IBS> on Protonix40; denies abd pain, n/v, d/c/blood...    Anxiety> on Xanax0.5mg 57m, she was INTOL to Zoloft & on Lexapro10mg 67mec to incr to 20mg t55m... She saw Jocelyn [name] [name] for GYN 5/13> mult menopausal symptoms & she was started on Prempro...  LABS 7/13 by Jocelyn [name]> FLP- at goals on Lip+Zetia;  Chems- wnl;  CBC- Hg=12.4, Ferritin low at 14.9, B12= 211; TSH=1.53;   ~  December 04, 2012:  78mo ROV74moenise has mult somatic complaints and they all seem to revolve around not resting well & aching/ sore/ tender in chest wall; we reviewed FM diagnosis which she has carried for yrs and prev saw Jocelyn [name] [name]- Rec trial Lunesta 2mgQhs &65mamadol Tid prn... We reviewed the following medical problems during today's office visit >>     AR/ AB/ Hx Sarcoid> on Mucinex & MMW prn; denies cough, sputum, hemoptysis, ch in dyspnea- she has SOB/DOE but is too sedentary & needs to incr exercise program; her chest discomfort sounds like CWP from  her FM & we discussed FM rx below...    HBP/ CAD> on ASA81, Imdur120, ToprolXL75, Cardizem240, Lasix20prn (seldom takes); BP=128/80 & she has mult somatic c/o- CWP, tired/no energy, but denies palpit, ch in SOB, edema, etc; followed by Jocelyn [name] [name] cards- Jocelyn [name] [name]/ Jocelyn [name] [name] & seen recently but we don't have note; they prev added RANEXA 558m Bid for her CP (now off this med)...    CHOL> on Lip40, Zetia10, CoQ10; she has gained 4# on diet down to 186# today; FLP is followed by ESadie HaberCards per pt- pt reports concern for "particle size" & she was rec to enter into a drug trial; last FLP 7/13 showed TChol 137, TG 89, HDL 42, LDL 73...    DM> followed by Jocelyn [name] on Metform500Bid & Onglyza2.5; last labs from [facility]9/13 showed BS=201, A1c=6.0; he rec same  meds...    GI> GERD, IBS> on Protonix40; denies abd pain, n/v, d/c/blood...    FM> This is her CC w/ not resting well, wakes tired, poor energy, aching/ sore/ tender (esp in chest in AM) & we discussed trial Lunesta 244mhs, & Tramadol 5016md; plus rest, heat, etc... States she's INTOL Ambien w/ weird dreams & Benedryl/ Melatonin w/o help...    Anxiety> on Xanax0.5mg54mn, she was INTOL to Zoloft & on Lexapro10mg23mrec to incr to 20mg 13my...    MISC> she tells me that Jocelyn [name] treated her low VitD w/ 50K weekly, then switched to daily OTC supplement; she says he wants me to address her other labs that he did 7/13- Ferritin sl low at 14.9 (Rec FeSO4 325mg/d10m12 borderline low at 211 (Rec OTC Vit B12 1000mg da80m & we will f/u labs later... We reviewed prob list, meds, xrays and labs> see below for updates >> LABS per Jocelyn [name], Jocelyn EnSadie Haberne...  CXR 1/14 showed cardiomeg, clear lungs, no adenopathy, mild scoliosis...   ~  August 08, 2013:  21mo ROV 31monise haTamiyahral issues to discuss> 1) c/o sinus congestion- unresponsive to her OTC meds & rec to use Nasal saline mist, Mucinex 1200mgBid, 63mds, and we will treat w/ ZPak & Depo80;  2) told by Jocelyn [name] that Iron was low & needed f/u here but we don't have these labs from him- repeat here today shows Hg=12.5, MCV=88, Fe=47 (12%sat), Ferritin=10.8; she is due for f/u colon w/ Jocelyn [name] [name] & we will refer, rec to start FeSO4 325mg Bid w26mtC500 w/ plans for f/u CBC, Fe studies in 2-30mo;  She c70monues to f/u w/ Jocelyn [name] regularly- We reviewed the following medical problems during today's office visit >>     AR/ AB/ Hx Sarcoid> on Mucinex & MMW prn; denies cough, sputum, hemoptysis, or ch in dyspnea- she has SOB/DOE but is too sedentary & needs to incr exercise program; some CWP from her FM & we discussed FM rx below...    HBP/ CAD> on ASA81, Imdur120, Toprol50-1/2Bid, Cardizem240, Lasix20prn (seldom takes) & ?Amlodip5 from Jocelyn [name]?; BP=[employer] & she has mult  somatic c/o- CWP, tired/no energy, but denies palpit, ch in SOB, edema, etc; followed by Jocelyn cards-Jocelyn Haberrner/ Jocelyn [name] [name], no recent notes.    CHOL> on Lip40 => notes from Jocelyn [name] says ?[place] Duty +[employer]arm[employer] study drug?; she has gained 4# to 190# today; FLP is followed by Jocelyn Cards [facility] & she reports concern for "particle size" & last avail FLP was 7/14 by Jocelyn [name] w/ TChol 190, HDL 100, LDL 40    DM> followed by Jocelyn [name] on Metform500Bid & Onglyza2.5 => ?he changed to Kombiglyze5-1000 daily?; last  labs from [place] 7/14 showed BS=105, A1c=6.5; and she remains on the same meds...    GI> GERD, IBS> on Protonix40; denies abd pain, n/v, d/c, or blood seen; last colonoscopy was 7/03 by [name] and reported wnl; f/u due now esp in light of her low Ferritin level...    FM> This is her CC w/ not resting well, wakes tired, poor energy, aching/ sore/ tender (esp in chest in AM) & we discussed the importance of a good night's rest, plus Tramadol 83mTid; States she's INTOL Ambien w/ weird dreams & Benedryl/ Melatonin w/o help...    Anxiety> on Xanax0.523mPrn, she was INTOL to Zoloft & she stopped prev Lexapro Rx...    Vit D defic> she tells me that Jocelyn [name] treated her low VitD w/ 50K weekly, then switched to daily OTC supplement...    Vit B12 defic> Jocelyn [name] has her on B12 shots (weekly x4, then monthly) for B12 level done 8/14 = 154...    Borderline anemia & low Ferritin> pt reports that labs from DrMillerhowed this & he wanted usKoreao eval & treat; Labs 9/14 showed Hg= 12.5, MCV=88, Fe=47 (12%sat), Ferritin=10.8; she is due for f/u colon w/ Jocelyn [name] [name] & we will refer, rec to start FeSO4 32545mid w/ VitC500 w/ plans for f/u CBC, Fe studies in 2-61mo70moWe reviewed prob list, meds, xrays and labs> see below for updates >> SHE DID NOT BRING MED BOTTLES OR CURRENT MED LIST TO THE OV TODAY!!! She is not clear on wat meds have been changed by Jocelyn [name] etalHarriett [name] ~  February 13, 2014:  103mo 6103mo& Jocelyn [name] [name] has mult somatic complaints-  recent gastroenteritis (on Zofran & Immodium), c/o swelling all over "they don't know what it is" states she watches salt & exercises by walking 3-4d/wk... We reviewed the following medical problems during today's office visit >>     AR/ AB/ Hx Sarcoid> on Mucinex & MMW prn; denies cough, sputum, hemoptysis, or ch in dyspnea- she has SOB/DOE but is too sedentary & needs to incr exercise program; some CWP from her FM & we discussed FM rx below...    HBP/ CAD> on ASA81, Toprol50-1/2Bid, Cardizem240, Lasix20prn (ave 1/d) & ?Amlodip5 from Jocelyn [name]?[employer]=124/76 & she has mult somatic c/o- CWP, tired/no energy, but denies palpit, ch in SOB, edema, etc; followed by EagleSadie Habers- Jocelyn [name] [name], no recent notes.    CHOL> off Lipitor => notes from Jocelyn [name] Buddy Duty ?Lip80 +Pharm[employer] study drug? (both stopped due to elev CPK); she has gained 8# to 198# today; [facility] & she reports concern for "particle size" & last avail FLP was 7/14 by Jocelyn [name] w/ TChol 190, HDL 100, LDL 40...    DM> followed by Jocelyn [name] on Metform500Bid & Onglyza5 => ?he changed to Kombiglyze5-1000 daily?; [place] showed BS=105, A1c=6.5; and she remains on the same meds...    GI> GERD, IBS> on Protonix40; denies abd pain, n/v, d/c, or blood seen; last colonoscopy was 7/03 by [name] and reported wnl; f/u due now esp in light of her low Ferritin level...    FM> This is her CC w/ not resting well, wakes tired, poor energy, aching/ sore/ tender (esp in chest in AM) & we discussed the importance of a good night's rest- states she's INTOL Ambien w/ weird dreams & Benedryl/ Melatonin w/o help...    Anxiety> on Xanax0.5mg P29m she was INTOL to Zoloft & she stopped prev Lexapro Rx...    Vit D  defic> she tells me that Jocelyn [name] treated her low VitD w/ 50K weekly, then switched to daily OTC supplement...    Vit B12 defic> Jocelyn [name] has her on B12 shots (weekly x4, then monthly) for B12 level done 8/14 = 154...    Borderline anemia &  low Ferritin> pt reports that labs from [place] showed this & he wanted [name] to eval & treat; Labs 9/14 showed Hg= 12.5, MCV=88, Fe=47 (12%sat), Ferritin=10.8; she is due for f/u colon w/ Jocelyn [name] [name] & we will refer, rec to start FeSO4 360m Bid w/ VitC500 w/ plans for f/u CBC- done 4/15 w/ Hg=12.5, Fe=31 (8%sat) but only taking Iron once daily; rec to increase to Bid... We reviewed prob list, meds, xrays and labs> see below for updates >>   CXR 4/15 showed mild cardiomeg, clear lungs, NAD...  LABS 4/15:  Chems- wnl;  CBC- ok w/ Hg=12.5 but Fe=31 (8%);  TSH=1.62;  ACE=56 (8-52);  BNP=26...  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  In the interval she has lost 1#,  BP= 134/80, & she has started FeSO4 1-2/d & GI appt w/ [name] is still pending...  She notes some constip on the Fe and we will rec Ferrosequels or similar product + laxatives prn...         Problems List:  MENIERE'S DISEASE (ICD-3 - eval by ENT, DrBates- Rx w/ diuretic, low salt, Xanax vs Valium, & Antivert Prn... dizziness recurred & ENT [facility]- we don't have notes but pt reports that nothing was found.  ALLERGIC RHINITIS - increased allergy symptoms in the spring... rec> Claritin, Saline, Flonase...  Hx of ASTHMATIC BRONCHITIS, ACUTE  - requires occas Antibiotics and Medrol for infectious exac- last Oct09 & resolved w/ Avelox/ Depo/ Medrol... has not been on regular inhalers, but uses XOPENEX MDI (w/ spacer), [place]rn... ~  essentially neg CTChest 9/08 after CXR suggested RULnodule. ~  CXR 10/09 showed cardiomegaly, clear lungs, min scoliosis, NAD. ~  CXR 9/11 showed cardiomeg, clear lungs, NAD...[name]~  CXR  1/14 showed cardiomeg, clear lungs, no adenopathy, mild scoliosis... ~  CXR 4/15 showed mild cardiomeg, clear lungs, NAD...  Hx of SARCOIDOSIS  - Dx'd 1980's w/ bronch bx... Rx Pred transiently and improved... no active disease x years... ~  baseline CXRs showed cardiomegaly, clear lungs, min scoliosis, NAD.  Hx of Mild OBSTRUCTIVE SLEEP APNEA - sleep eval DrClance 2006 w/ RDI= 5 only.  HYPERTENSION, BENIGN  - on TOPROL XL 5016m1.5 tabs daily, CARDIZEM 240m58m LASIX 20mg67mt as needed now... ~  prev OV's 130-140's / 80's... prev noted sl HA, chest tightness, sl SOB, & mult somatic complaints... Labs & renal- all WNL. ~  4/12:  BP=122/76 today, and similar at home per pt... She denies CP, palpit, SOB, edema, etc... Labs & renal- all WNL. ~  1/14: on ASA81, Imdur120, ToprolXL75, Cardizem240, Lasix20prn (seldom takes); BP=128/80 & she has mult somatic c/o- CWP, tired/no energy, but denies palpit, ch in SOB, edema, etc; followed by EagleSadie Habers- Jocelyn [name] [name]/ Jocelyn [name] [name] & seen recently but we don't have note; they prev added RANEXA 500mg 19mfor her CP (now off this med);  we discussed trial Lunesta 36mQhs for sleep & Tramadol 565mid for pain... ~  9/14:  on ASA81, Imdur120, Toprol50-1/2Bid, Cardizem240, Lasix20prn (seldom takes); BP=132/74 & she has mult somatic c/o- CWP, tired/no energy, but denies palpit, ch in SOB, edema, etc; followed by [name]- Jocelyn [name] [name]/ Jocelyn [name] [name], no recent notes ~  4/15:  on ASA81, Toprol50-1/2Bid, Cardizem240, Lasix20prn (ave 1/d) & ?Amlodip5 from [employer] BP=124/76 & she has mult somatic c/o- CWP, tired/no energy, but denies palpit, ch in SOB, edema, etc; followed by [name]- Jocelyn [name] [name], no recent notes.  CORONARY ARTERY DISEASE - on ASA 8146m, IMDUR 120m18m Toprol XL, Cardizem, Lasix as above... Followed by Jocelyn [name] [name]. ~  Cath in 2000 showed non-obstructive CAD (20-30% lesions in all 3 vessels) w/ rec for risk factor modification.   ~  NuclearStressTest 4/07 showed no  ischemia & EF=73%. ~  Mar10:  she's concerned about BP and chest tightness, requests Cardiac eval Jocelyn [name] [name] & we will refer... ~  2DEcho 4/10 showed norm LVF w/ EF=55-60%, norm MV, norm AoV, Gr1DD ~  4/10 eval by Jocelyn [name] [name] w/ MYOVIEW- neg- breast attenuation, no regional wall motion abn, EF= 75% ~  4/10 Cath by Jocelyn [name] [name] w/ 90% small 2nd diag branch of LAD <too sm for PTCA> & luminal irreg up to 20% in RCA, +DD- Med Rx. ~  She had more chest tightness & another Myoview from Jocelyn [name] [name] 8/12- it too was neg, no ischemia, no infarct; +breast attenuation; EF=70% & nno regional wall motion abnormalities;  IMDUR has been incr to 120mg78m~  1/14: she tells me Jocelyn [facility]'t have notes from him> EKG sent showed NSR, rate72, wnl, NAD... ~  EKG 5/14 showed NSR, rate 93, WNL, NAD... ~  12/14: she saw Jocelyn [name] [name] for Cards f/u (note reviewed)> HBP, CAD, Chol; off Imdur w/o angina, continue same meds including both Cardizem & Amlodipine, encouraged to continue [employer] study...  VENOUS INSUFFICIENCY - [facility] DrWWoods and seen by DrSevier 6/08= ven insuffic Rx w/ TED's, no salt, elevation, etc. ~  10/10: notes bilat ankle ?lipoma in front of the lat malleoli- asymptomatic but several people have noticed them and she request refer to Ortho (rec DrBednarz when she is ready).  HYPERCHOLESTEROLEMIA  - on LIPITOR 40mg/27mZETIA 10mg/d74moQ10 per Jocelyn [name] [name]... Diet & exercise stressed to the pt. ~  FLP here 12/07 on Lip10 showed TChol 156, TG 94, HDL 51, LDL 86 ~  FLP 1/09 on Lip10 showed TChol 157, TG 122, HDL 49, LDL 84 ~  [facility] showed TChol 176, TG 116, HDL 55, LDL 98 ~  FLP 3/10 on Lip10 showed TChol 170, TG 94, HDL 59, LDL 93 ~  FLP 6/10 by Jocelyn [name] [name] on Lip20 showed TChol 134, TG 82, HDL 55, LDL 73 ~  FLP 4/11 here on Lip20+Zetia? showed TChol 145, TG 74, HDL 68, LDL 63 ~  9/11:  she reports that Jocelyn [name] [name] incr Lipitor to  40mg, p22mthe Zetia 10mg, an52me does her FLPs. ~  FLP here 4/12 on Lip40+Zetia showed TChol 153, TG 54, HDL 53, LDL 90... Copy sent to Jocelyn [name] [name] ~  FLP by DrCarlinr 7/13 on Lip40+Zetia10 showed TChol 137, TG 89, HDL 42, LDL 73 ~  9/14: on Lip40 => notes from Jocelyn [name] saysSuttons Bayp80 +Pharm[employer] study drug?; she has gained 4# to 190# today; [facility] & she reports concern for "particle size" &  last avail FLP was 7/14 by Jocelyn [name] w/ TChol 190, HDL 100, LDL 40  DIABETES MELLITUS  - on Metformin 531mBid + PARLODEL 2.526md per Jocelyn [name] [name]... She is intol to Actos w/ edema, she stopped Januvia due to ?side effect, she wondered about Tajenta since her daugh works for [employer]~  labs 9/08 showed FBS=119, HgA1c=6.6 ~  [facility] BS= 108, HgA1c= 6.5 ~  labs 3/10 showed BS= 113, A1c= 6.6 ~  labs 6/10 by Jocelyn [name] [name] showed BS= 92; and 3/11 BS= 127 ~  labs 4/11 showed BS= 124, A1c= 7.6...Marland Kitchenrec> incr Metform to Bid; she [facility] at [facility]... ~  6/11:  pt requested Endocrine consult & seen by Jocelyn [name] [name] w/ Januvia 1007m added. ~  labs 9/11 showed BS= 98, A1c= 7.3... Marland KitchenMarland Kitchene stopped Januvia due to side effects & wonders about using Tajenta instead. ~  Jocelyn [name] [name] started Bro[employer]5mg25m.. She is now taking this +Metform 500mg15m.. ~  Labs 4/12 (wt=195#) showed Bs= 116, A1c= 7.1 ~  Labs 7/12 showed A1c= 7.2;  Umicroalb= 3.5 ~  Labs 10/12 (wt=186#) showed A1c= 6.7 NOTE: She participated in a [facility] trial called the AfricanAmerican- Diabetes Heart Study in 2012; they sent a report indicating: 1) her memory was fine, but her MRI Brain showed sm vessel dis & mild sinus dis, otherw neg; 2) she may have treatable depression but we tried her on Zoloft & she was intol==> ch to Lexapro. ~  followed by Jocelyn [name] on Metform500Bid & Onglyza2.5; labs from DrKerCloster showed BS=201, A1c=6.0; he rec same meds... ~  9/14: followed by Jocelyn [name] on Metform500Bid & Onglyza2.5 => ?he  changed to Kombiglyze5-1000 daily?; [place] showed BS=105, A1c=6.5; and she remains on the same meds... ~  1/15: she had f/u Jocelyn [name] for endocrine> DM, Obesity, Adrenal adenoma, VitB12 defic; labs reviewed, note reviewed- he tried Belviq but she was intol...  GERD SYMPTOMS - pt placed on PROTONIX 40mg/21m improvement in reflux symptoms...    ADDENDUM>> note from [name] indicates> upper endoscopy in July 1994 showed antral gastritis. 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  IRRITABLE BOWEL SYNDROME  - colonoscopy 7/03 by [name] was WNL. ~  9/14: she was referred to GI for f/u colon but never did it... ~  4/15: we will refer her to GI again...  RENAL CALCULUS  - sm stone seen in L kidney on CTScan... [facility]  FIBROMYALGIA  - prev Rx by Jocelyn [name] [name] in 2006... ~  1/14: This is her CC w/ not resting well, wakes tired, poor energy, aching/ sore/ tender (esp in chest in AM) & we discussed trial Lunesta 2mgQhs76m Tramadol 50mgTid96mus rest, heat, etc... States she's INTOL Ambien w/ weird dreams & Benedryl/ Melatonin w/o help... ~  11/14: she went to the ER w/ LBP> felt to be a lumbar strain...  VITAMIN D DEFICIENCY - [facility]... supplemented w/ OTC Vit D 1000 u daily. ~  Vit D level was lower from Jocelyn [name] & he treated w/ Vit D 50K weekly for awhile, then switched to OTC supplement...  ANXIETY - on ALPRAZOLAM 0.5mg Prn.65mDEPRESSION - prev on Lexapro 20mg/d bu24me stopped on her own ~10/13 ?cost issues? ~  4/12: c/o feeling sad & anxious all the time- weak, not resting well, under a lot of stress; rec to seek counselling thru her local church or let us refer[place]  to [place], & start RX w/ Zoloft 50==> but she was INTOL... ~  2013: switched to Lexapro & seems to be doing better but didn't stick w/ it due to cost issues...  ANEMIA & LOW FERRITIN LEVEL >>  B12 DEFICIENCY  >> treated by Jocelyn [name] w/ B12 shots ~  9/14: she tells me that Ferritin is low & Jocelyn [name] tried her on Oral Fe prep w/o improvement & wants me to f/u and treat this problem; we discussed need to recheck labs here- LABS 9/14 showed Hg=12.5, MCV=88, Fe=47 (12%sat), Ferritin=10.8; she is due for f/u colon w/ Jocelyn [name] [name] & we will refer, rec to start FeSO4 358m Bid w/ VitC500 w/ plans for f/u CBC, Fe studies in 2-361mo~  4/15:  Labs showed Hg= 12.5 but Fe=31 (8%); she was only taking the Fe once daily 7 advised to incr to Bid w/ vitC500; needs f/u colon & we will refer to [name] again...  HEALTH MAINTENANCE: ~  GI:  Followed by [name] & colon due 7/13... ~  GYN:  Followed by Jocelyn [name] [name] & she gets yearly PAP, Mammogram, hasn't had baseline BMD yet, [facility]... ~  Immunizations:  She gets the yearly Flu shots at school;  Had PNEUMOVAX previously; TDAP given 4/12...   No past surgical history on file - other than Lithotripsy for renal stone...   Outpatient Encounter Prescriptions as of 03/18/2014  Medication Sig  . ALPRAZolam (XANAX) 0.5 MG tablet take 1/2 tablet by mouth MID MORNING AND MID AFTERNOON, and 1 tablet by mouth at bedtime  . aspirin 81 MG tablet Take 81 mg by mouth daily.    . Cyanocobalamin 1000 MCG/ML KIT Inject 1,000 mg as directed every 30 (thirty) days.  . Marland Kitcheniltiazem (CARDIZEM CD) 240 MG 24 hr capsule take 1 capsule by mouth once daily  . estrogen, conjugated,-medroxyprogesterone (PREMPRO) 0.3-1.5 MG per tablet Take 1 tablet by mouth daily.  . ferrous sulfate 325 (65 FE) MG tablet Take 325 mg by mouth 2 (two) times daily with a meal.   . furosemide (LASIX) 20 MG tablet Take 2 tablets by mouth every morning  . losartan (COZAAR) 50 MG tablet Take 1 tablet (50 mg total) by mouth daily.  . meclizine (ANTIVERT) 25 MG tablet Take 25 mg by mouth. 1/2 to 1 tab by mouth every 4 hours as needed for dizziness   . metoprolol tartrate (LOPRESSOR) 25 MG tablet Take 1 tablet (25 mg  total) by mouth 2 (two) times daily.  . nitroGLYCERIN (NITROSTAT) 0.4 MG SL tablet Place 1 tablet (0.4 mg total) under the tongue every 5 (five) minutes as needed for chest pain.  . Marland Kitchenndansetron (ZOFRAN) 4 MG tablet Take 1 tablet (4 mg total) by mouth every 8 (eight) hours as needed for nausea or vomiting.  . pantoprazole (PROTONIX) 40 MG tablet Take 1 tablet (40 mg total) by mouth daily.  . Saxagliptin-Metformin (KOMBIGLYZE XR) 03-999 MG TB24 Take 1 tablet by mouth once.  . sodium chloride (OCEAN NASAL SPRAY) 0.65 % nasal spray 1 spray by Nasal route as needed.    . valACYclovir (VALTREX) 500 MG tablet Take 500 mg by mouth daily.   . vitamin C (ASCORBIC ACID) 500 MG tablet Take 500 mg by mouth 2 (two) times daily. With the iron tablets    Allergies  Allergen Reactions  . Codeine     REACTION: vomiting  . Morphine     REACTION: vomiting  . Pioglitazone     REACTION: pt states INTOL w/  edema  . Sitagliptin Phosphate     REACTION: nausea    Current Medications, Allergies, Past Medical History, Past Surgical History, Family History, and Social History were reviewed in [employer] record.   Review of Systems        See HPI - all other systems neg except as noted...      The patient complains of weight gain, dyspnea on exertion, and peripheral edema.  The patient denies anorexia, fever, weight loss, vision loss, decreased hearing, hoarseness, chest pain, syncope, prolonged cough, headaches, hemoptysis, abdominal pain, melena, hematochezia, severe indigestion/heartburn, hematuria, incontinence, genital sores, suspicious skin lesions, transient blindness, difficulty walking, depression, unusual weight change, abnormal bleeding, enlarged lymph nodes, and angioedema.     Objective:   Physical Exam     WD, Overweight, 65 [name]/o BF in NAD... GENERAL:  Alert & oriented; pleasant & cooperative. HEENT:  [place]/AT, EOM-wnl, PERRLA, EACs-clear, TMs-wnl, NOSE-clear, THROAT-clear &  wnl. NECK:  Supple w/ fairROM; no JVD; normal carotid impulses w/o bruits; no thyromegaly or nodules palpated; no lymphadenopathy. CHEST:  Clear to P & A; without wheezes/ rales/ or rhonchi heard; sl tender on palpation of chest wall. HEART:  Regular Rhythm; without murmurs/ rubs/ or gallops. ABDOMEN:  Soft & nontender; normal bowel sounds; no organomegaly or masses detected. EXT: without deformities, mild arthritic changes; no varicose veins but +venous insuffic & tr edema; +trigger points. NEURO:  CN's intact;  no focal neuro deficits... DERM:  No lesions noted; no rash etc...  RADIOLOGY DATA:  Reviewed in the EPIC EMR & discussed w/ the patient...  LABORATORY DATA:  Reviewed in the EPIC EMR & discussed w/ the patient...   Assessment & Plan:    RESP>  Hx AR, Asthma, old sarcoid, mild OSA>  All stable, breathing OK, but she notes some wheezing w/ exercise;  On Mucinex & asked to use her Xopenex prior to exercise which she didn't do; we could consider a leukotriene receptor modifier in the future if nec... CXR remains stable/ NAD...   CARDS>  Followed by Jocelyn [name] [name]/ [facility] Mote ?on meds listed? SHE DID NOT BRING MEDS TO THE OV> Hx HBP, CAD, VI, etc;  Chest pain is non-cardiac & likely related to her FM; Doing satis & we reviewed exercise program etc...  CHOL>  Managed by Jocelyn [name] [name] for Cards- off Lip + off Zetia; ? Taking a study drug from [place]...  DM>  Followed by Jocelyn [name] on Metformin, Onglyzal; A1c improved to 6.7 in 2012, then 6.0 in 2013, & 6.5  In 2014;  rec continue meds & asked to have records sent to [name]...  GI>  Stable on Protonix as needed...  FM>  She has long standing FM which appears to have flaired up recently- not resting, no energy, aches & pains, etc; we discussed getting proper sleep- she is intol to [place]; on Tramadol50 prn pain...  Anxiety>  She remains on Alprazolam as needed; & she stopped the Lexapro..  Anemia, Low Ferritin> on FeSO4 385m & VitC500,  needs to incr meds to Bid & f/u w/ GI for colonoscopy...   Patient's Medications  New Prescriptions   No medications on file  Previous Medications   ASPIRIN 81 MG TABLET    Take 81 mg by mouth daily.     CYANOCOBALAMIN 1000 MCG/ML KIT    Inject 1,000 mg as directed every 30 (thirty) days.   DILTIAZEM (CARDIZEM CD) 240 MG 24 HR CAPSULE    take 1 capsule by mouth once daily  ESTROGEN, CONJUGATED,-MEDROXYPROGESTERONE (PREMPRO) 0.3-1.5 MG PER TABLET    Take 1 tablet by mouth daily.   FERROUS SULFATE 325 (65 FE) MG TABLET    Take 325 mg by mouth 2 (two) times daily with a meal.    FUROSEMIDE (LASIX) 20 MG TABLET    Take 2 tablets by mouth every morning   GUAIFENESIN (MUCINEX MAXIMUM STRENGTH) 1200 MG TB12    Take 1 tablet by mouth daily.   LOSARTAN (COZAAR) 50 MG TABLET    Take 1 tablet (50 mg total) by mouth daily.   MECLIZINE (ANTIVERT) 25 MG TABLET    Take 25 mg by mouth. 1/2 to 1 tab by mouth every 4 hours as needed for dizziness    METOPROLOL TARTRATE (LOPRESSOR) 25 MG TABLET    Take 1 tablet (25 mg total) by mouth 2 (two) times daily.   NITROGLYCERIN (NITROSTAT) 0.4 MG SL TABLET    Place 1 tablet (0.4 mg total) under the tongue every 5 (five) minutes as needed for chest pain.   ONDANSETRON (ZOFRAN) 4 MG TABLET    Take 1 tablet (4 mg total) by mouth every 8 (eight) hours as needed for nausea or vomiting.   SAXAGLIPTIN-METFORMIN (KOMBIGLYZE XR) 03-999 MG TB24    Take 1 tablet by mouth once.   SODIUM CHLORIDE (OCEAN NASAL SPRAY) 0.65 % NASAL SPRAY    1 spray by Nasal route as needed.     VALACYCLOVIR (VALTREX) 500 MG TABLET    Take 500 mg by mouth daily.    VITAMIN C (ASCORBIC ACID) 500 MG TABLET    Take 500 mg by mouth 2 (two) times daily. With the iron tablets  Modified Medications   Modified Medication Previous Medication   ALPRAZOLAM (XANAX) 0.5 MG TABLET ALPRAZolam (XANAX) 0.5 MG tablet      take 1/2 tablet by mouth MID MORNING AND MID AFTERNOON, and 1 tablet by mouth at bedtime    take  1/2 tablet by mouth MID MORNING AND MID AFTERNOON, and 1 tablet by mouth at bedtime   PANTOPRAZOLE (PROTONIX) 40 MG TABLET pantoprazole (PROTONIX) 40 MG tablet      Take 1 tablet (40 mg total) by mouth daily.    Take 1 tablet (40 mg total) by mouth daily.  Discontinued Medications   No medications on file

## 2014-03-18 NOTE — Patient Instructions (Signed)
Today we updated your med list in our EPIC system...    Continue your current medications the same...  Let's plan an ROV for recheck of your Sarcoidosis in 4-6 months, sooner if needed for any reason.Marland KitchenMarland Kitchen

## 2014-03-29 ENCOUNTER — Telehealth: Payer: Self-pay | Admitting: Pulmonary Disease

## 2014-03-29 MED ORDER — AZITHROMYCIN 250 MG PO TABS: ORAL_TABLET | ORAL | Status: AC

## 2014-03-29 NOTE — Telephone Encounter (Signed)
Pt is aware of SN's recs. Rx has been sent in. Nothing further is needed.

## 2014-03-29 NOTE — Telephone Encounter (Signed)
Per NS: okay for ZPAK thanks

## 2014-03-29 NOTE — Telephone Encounter (Signed)
C/o chest congestion, prod cough yellow phlem, wheezing, chest tx x 1 week. She is taking mucinex DM daily. Requesting ZPAK. Please advise SN thanks  Allergies  Allergen Reactions  . Codeine     REACTION: vomiting  . Morphine     REACTION: vomiting  . Pioglitazone     REACTION: pt states INTOL w/ edema  . Sitagliptin Phosphate     REACTION: nausea

## 2014-04-12 ENCOUNTER — Encounter: Payer: Self-pay | Admitting: Internal Medicine

## 2014-04-12 ENCOUNTER — Other Ambulatory Visit (INDEPENDENT_AMBULATORY_CARE_PROVIDER_SITE_OTHER): Payer: BC Managed Care – PPO

## 2014-04-12 ENCOUNTER — Ambulatory Visit (INDEPENDENT_AMBULATORY_CARE_PROVIDER_SITE_OTHER): Payer: BC Managed Care – PPO | Admitting: Internal Medicine

## 2014-04-12 VITALS — BP 132/80 | HR 88 | Ht 62.0 in | Wt 202.0 lb

## 2014-04-12 DIAGNOSIS — Z1211 Encounter for screening for malignant neoplasm of colon: Secondary | ICD-10-CM

## 2014-04-12 DIAGNOSIS — K219 Gastro-esophageal reflux disease without esophagitis: Secondary | ICD-10-CM

## 2014-04-12 LAB — IBC PANEL
Iron: 103 ug/dL (ref 42–145)
SATURATION RATIOS: 29.1 % (ref 20.0–50.0)
TRANSFERRIN: 252.8 mg/dL (ref 212.0–360.0)

## 2014-04-12 LAB — CBC WITH DIFFERENTIAL/PLATELET
BASOS ABS: 0 10*3/uL (ref 0.0–0.1)
Basophils Relative: 0.3 % (ref 0.0–3.0)
EOS ABS: 0.1 10*3/uL (ref 0.0–0.7)
Eosinophils Relative: 0.9 % (ref 0.0–5.0)
HCT: 35.2 % — ABNORMAL LOW (ref 36.0–46.0)
Hemoglobin: 12 g/dL (ref 12.0–15.0)
LYMPHS PCT: 31.9 % (ref 12.0–46.0)
Lymphs Abs: 2.3 10*3/uL (ref 0.7–4.0)
MCHC: 34 g/dL (ref 30.0–36.0)
MCV: 88.4 fl (ref 78.0–100.0)
Monocytes Absolute: 0.7 10*3/uL (ref 0.1–1.0)
Monocytes Relative: 10.4 % (ref 3.0–12.0)
Neutro Abs: 4 10*3/uL (ref 1.4–7.7)
Neutrophils Relative %: 56.5 % (ref 43.0–77.0)
Platelets: 259 10*3/uL (ref 150.0–400.0)
RBC: 3.98 Mil/uL (ref 3.87–5.11)
RDW: 13.7 % (ref 11.5–15.5)
WBC: 7.1 10*3/uL (ref 4.0–10.5)

## 2014-04-12 MED ORDER — MOVIPREP 100 G PO SOLR: 1.0000 | Freq: Once | ORAL | Status: DC

## 2014-04-12 NOTE — Progress Notes (Signed)
Jocelyn [name] Apr 21, 1956 [id number]  Note: This dictation was prepared with Dragon digital system. Any transcriptional errors that result from this procedure are unintentional.   History of Present Illness:  This is a 58 year old postmenopausal female with severe iron deficiency. She had a serum iron of 31, iron saturation 8.2 %. She has a history of sarcoidosis. An upper endoscopy in July 1994 showed antral gastritis. 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 She has also had several colonoscopies; in 1994 and again in July 2003 which showed spastic colon but no other abnormalities. Random biopsies of the colon did not show any evidence of granulomas. She has a history of IBS with predominant diarrhea. Her last period was about one year ago. Her hemoglobin has been normal as well as her liver function tests. She denies visible blood per rectum, heartburn, indigestion or abdominal pain. She has been taking iron supplements twice a day which cause her to have severe constipation.    Past Medical History  Diagnosis Date  . Meniere's disease, unspecified   . Allergic rhinitis, cause unspecified   . Acute bronchitis   . Sarcoidosis   . Obstructive sleep apnea (adult) (pediatric)   . Essential hypertension, benign   . Coronary atherosclerosis of unspecified type of vessel, native or graft   . Unspecified venous (peripheral) insufficiency   . Pure hypercholesterolemia   . Type II or unspecified type diabetes mellitus without mention of complication, not stated as uncontrolled   . Irritable bowel syndrome   . Calculus of kidney   . Fibromyalgia   . Vitamin D deficiency   . Dizziness   . Anxiety   . Myalgia and myositis, unspecified   . Calculus of kidney   . BV (bacterial vaginosis) 06/22/1996  . H/O varicella   . Headache(784.0)     frequently  . Yeast infection   . Menses, irregular 2003  . Perimenopausal symptoms 2003  . Fibroid 2003  .  H/O dysmenorrhea 2008  . HSV-2 infection 2009  . Vulvitis 2010  . B12 deficiency   . Anemia     Past Surgical History  Procedure Laterality Date  . Tonsillectomy      Allergies  Allergen Reactions  . Actos [Pioglitazone]     REACTION: pt states INTOL w/ edema  . Codeine     REACTION: vomiting  . Januvia [Sitagliptin] Nausea Only  . Morphine     REACTION: vomiting  . Sitagliptin Phosphate     REACTION: nausea    Family history and social history have been reviewed.  Review of Systems: Denies dysphagia heartburn nausea vomiting or weight loss  The remainder of the 10 point ROS is negative except as outlined in the H&P  Physical Exam: General Appearance Well developed, in no distress Eyes  Non icteric  HEENT  Non traumatic, normocephalic  Mouth No lesion, tongue papillated, no cheilosis Neck Supple without adenopathy, thyroid not enlarged, no carotid bruits, no JVD Lungs Clear to auscultation bilaterally COR Normal S1, normal S2, regular rhythm, no murmur, quiet precordium Abdomen mildly protuberant soft. Nontender. Normoactive bowel sounds. No distention Rectal Brown Hemoccult-negative stool Extremities  No pedal edema Skin No lesions Neurological Alert and oriented x 3 Psychological Normal mood and affect  Assessment and Plan:   Problem #32 58 year old female with heme negative iron deficiency. She is postmenopausal. The hemoglobin is low  Normal. 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 Currently, her sarcoidosis is under good  control. She has no specific GI symptoms. Her last colonoscopy was in 2003. She is due for a recall colonoscopy. We will proceed with an upper endoscopy as well as colonoscopy to look for the source of bleeding to account for the iron deficiency. She will discontinue iron supplements one week prior to the test. She can take magnesium oxide pills daily for constipation caused by iron supplements. We  will be checking her iron levels today.    Lafayette Dragon 04/12/2014

## 2014-04-12 NOTE — Patient Instructions (Signed)
You have been scheduled for an endoscopy and colonoscopy with propofol. Please follow the written instructions given to you at your visit today. Please pick up your prep at the pharmacy within the next 1-3 days. If you use inhalers (even only as needed), please bring them with you on the day of your procedure. Your physician has requested that you go to www.startemmi.com and enter the access code given to you at your visit today. This web site gives a general overview about your procedure. However, you should still follow specific instructions given to you by our office regarding your preparation for the procedure.  Hold iron 5 days prior to your tst.  Your physician has requested that you go to the basement for the following lab work before leaving today: CBC, IBC  Please purchase the following medications over the counter and take as directed: Magnesium Oxide 500 mg-Take 2 tablets every night for constipation  CC:Dr Teressa Lower

## 2014-04-17 ENCOUNTER — Encounter: Payer: Self-pay | Admitting: Internal Medicine

## 2014-05-13 ENCOUNTER — Other Ambulatory Visit: Payer: Self-pay

## 2014-05-13 ENCOUNTER — Telehealth: Payer: Self-pay | Admitting: Interventional Cardiology

## 2014-05-13 MED ORDER — DILTIAZEM HCL ER COATED BEADS 240 MG PO CP24: ORAL_CAPSULE | ORAL | Status: DC

## 2014-05-13 NOTE — Telephone Encounter (Signed)
New Message:  Pt is requesting to be worked in to see Dr. Irish Lack. Pt would prefer to not see a PA. PT is requesting to be worked in to see Dr. Irish Lack sometime before 8/19.. That is the date the pt returns to school.

## 2014-05-14 ENCOUNTER — Telehealth: Payer: Self-pay | Admitting: Internal Medicine

## 2014-05-14 NOTE — Telephone Encounter (Signed)
Appt made for 06/10/14 at 2:45 pm.

## 2014-05-14 NOTE — Telephone Encounter (Signed)
Pt notified of appt 

## 2014-05-14 NOTE — Telephone Encounter (Signed)
No answer - mailbox full.

## 2014-05-14 NOTE — Telephone Encounter (Signed)
I have advised patient that she may keep her colonoscopy appointment on 05/16/14. She should not take any more iron starting today until after her procedure. She verbalizes understanding.

## 2014-05-16 ENCOUNTER — Ambulatory Visit (AMBULATORY_SURGERY_CENTER): Payer: BC Managed Care – PPO | Admitting: Internal Medicine

## 2014-05-16 ENCOUNTER — Encounter: Payer: Self-pay | Admitting: Internal Medicine

## 2014-05-16 VITALS — BP 155/101 | HR 83 | Temp 96.3°F | Resp 19 | Ht 62.0 in | Wt 202.0 lb

## 2014-05-16 DIAGNOSIS — D509 Iron deficiency anemia, unspecified: Secondary | ICD-10-CM

## 2014-05-16 DIAGNOSIS — D126 Benign neoplasm of colon, unspecified: Secondary | ICD-10-CM

## 2014-05-16 DIAGNOSIS — Z1211 Encounter for screening for malignant neoplasm of colon: Secondary | ICD-10-CM

## 2014-05-16 DIAGNOSIS — D131 Benign neoplasm of stomach: Secondary | ICD-10-CM

## 2014-05-16 DIAGNOSIS — K219 Gastro-esophageal reflux disease without esophagitis: Secondary | ICD-10-CM

## 2014-05-16 DIAGNOSIS — K635 Polyp of colon: Secondary | ICD-10-CM

## 2014-05-16 HISTORY — DX: Polyp of colon: K63.5

## 2014-05-16 LAB — GLUCOSE, CAPILLARY: Glucose-Capillary: 109 mg/dL — ABNORMAL HIGH (ref 70–99)

## 2014-05-16 MED ORDER — SODIUM CHLORIDE 0.9 % IV SOLN: 500.0000 mL | INTRAVENOUS | Status: DC

## 2014-05-16 NOTE — Progress Notes (Signed)
A/ox3, pleased with MAC, report to RN 

## 2014-05-16 NOTE — Op Note (Addendum)
[facility]  [address]. [place], [address]   ENDOSCOPY PROCEDURE REPORT  PATIENT: Jocelyn [name], Jocelyn [name]  MR#: [id number] BIRTHDATE: [birth year] , 57  yrs. old GENDER: Female ENDOSCOPIST: Lafayette Dragon, MD REFERRED BY:  Teressa Lower, M.D. PROCEDURE DATE:  05/16/2014 PROCEDURE:  EGD w/ directed submucosal injection(s), any substance and EGD w/ snare technique ASA CLASS:     Class II INDICATIONS:  history of sarcoidosis, granulomatous gastritis on endoscopy in 1994.  Iron deficiency anemia.  Iron saturation 8.2%. Gastroesophageal reflux. MEDICATIONS: MAC sedation, administered by CRNA and Propofol (Diprivan) 240 mg IV TOPICAL ANESTHETIC: none  DESCRIPTION OF PROCEDURE: After the risks benefits and alternatives of the procedure were thoroughly explained, informed consent was obtained.  The LB [id number] endoscope was introduced through the mouth and advanced to the second portion of the duodenum. Without limitations.  The instrument was slowly withdrawn as the mucosa was fully examined.      Esophagus: esophageal mucosa was normal in the proximal mid and distal esophagus. Squamocolumnar junction was normal. On the gastric side of the GE junction was a sessile polyp measuring 9-10 mm it appeared hemorrhagic and hyperemic  Stomach: endoscope traversed into the stomach without resistance. Retroflexion of the endoscope confirmed presence of large gastric polyp on the gastric side of squamocolumnar junction. It was injected with epinephrine, total of 2 cc 1: 10 000 sln and snared with the hot snare and delivered to specimen container. There was no bleeding from post-polypectomy site. The rest of the stomach appeared normal no evidence of gastritis. Gastric outlet was normal  Duodenum: duodenal bulb and descending duodenum was normal[ The scope was then withdrawn from the patient and the procedure completed.  COMPLICATIONS: There were no  complications. ENDOSCOPIC IMPRESSION: gastric polyp at the gastric cardia. Status post snare polypectomy and epinephrine injection with complete hemostasis Polyp may be Seidel for low-grade GI blood loss  RECOMMENDATIONS: 1.  Await pathology results 2.  Continue PPI Proceed with colonoscopy 3.  Continue PPI Proceed with colonoscopy  REPEAT EXAM: for EGD pending biopsy results.  eSigned:  Lafayette Dragon, MD 05/16/2014 3:12 PM   CC:  PATIENT NAME:  Celine, Dishman MR#: [id number]

## 2014-05-16 NOTE — Patient Instructions (Signed)
YOU HAD AN ENDOSCOPIC PROCEDURE TODAY AT THE [facility]: Refer to the procedure report that was given to you for any specific questions about what was found during the examination.  If the procedure report does not answer your questions, please call your gastroenterologist to clarify.  If you requested that your care partner not be given the details of your procedure findings, then the procedure report has been included in a sealed envelope for you to review at your convenience later.  YOU SHOULD EXPECT: Some feelings of bloating in the abdomen. Passage of more gas than usual.  Walking can help get rid of the air that was put into your GI tract during the procedure and reduce the bloating. If you had a lower endoscopy (such as a colonoscopy or flexible sigmoidoscopy) you may notice spotting of blood in your stool or on the toilet paper. If you underwent a bowel prep for your procedure, then you may not have a normal bowel movement for a few days.  DIET: Your first meal following the procedure should be a light meal and then it is ok to progress to your normal diet.  A half-sandwich or bowl of soup is an example of a good first meal.  Heavy or fried foods are harder to digest and may make you feel nauseous or bloated.  Likewise meals heavy in dairy and vegetables can cause extra gas to form and this can also increase the bloating.  Drink plenty of fluids but you should avoid alcoholic beverages for 24 hours.  ACTIVITY: Your care partner should take you home directly after the procedure.  You should plan to take it easy, moving slowly for the rest of the day.  You can resume normal activity the day after the procedure however you should NOT DRIVE or use heavy machinery for 24 hours (because of the sedation medicines used during the test).    SYMPTOMS TO REPORT IMMEDIATELY: A gastroenterologist can be reached at any hour.  During normal business hours, 8:30 AM to 5:00 PM Monday through Friday,  call [phone].  After hours and on weekends, please call the GI answering service at [phone] who will take a message and have the physician on call contact you.   Following lower endoscopy (colonoscopy or flexible sigmoidoscopy):  Excessive amounts of blood in the stool  Significant tenderness or worsening of abdominal pains  Swelling of the abdomen that is new, acute  Fever of 100F or higher  Following upper endoscopy (EGD)  Vomiting of blood or coffee ground material  New chest pain or pain under the shoulder blades  Painful or persistently difficult swallowing  New shortness of breath  Fever of 100F or higher  Black, tarry-looking stools  FOLLOW UP: If any biopsies were taken you will be contacted by phone or by letter within the next 1-3 weeks.  Call your gastroenterologist if you have not heard about the biopsies in 3 weeks.  Our staff will call the home number listed on your records the next business day following your procedure to check on you and address any questions or concerns that you may have at that time regarding the information given to you following your procedure. This is a courtesy call and so if there is no answer at the home number and we have not heard from you through the emergency physician on call, we will assume that you have returned to your regular daily activities without incident.  SIGNATURES/CONFIDENTIALITY: You and/or your care   partner have signed paperwork which will be entered into your electronic medical record.  These signatures attest to the fact that that the information above on your After Visit Summary has been reviewed and is understood.  Full responsibility of the confidentiality of this discharge information lies with you and/or your care-partner.   INFORMATION ON COLON POLYPS AND HIGH FIBER DIET GIVEN TO YOU TODAY  CONTINUE ANTI REFLUX MEDICATION

## 2014-05-16 NOTE — Progress Notes (Signed)
Called to room to assist during endoscopic procedure.  Patient ID and intended procedure confirmed with present staff. Received instructions for my participation in the procedure from the performing physician.  

## 2014-05-16 NOTE — Op Note (Signed)
[facility]  [address]. [place], [address]   COLONOSCOPY PROCEDURE REPORT  PATIENT: Jocelyn [name], Jocelyn [name]  MR#: [id number] BIRTHDATE: 1956/05/15 , 57  yrs. old GENDER: Female ENDOSCOPIST: Lafayette Dragon, MD REFERRED RD:EYCXK [name], M.D. PROCEDURE DATE:  05/16/2014 PROCEDURE:   Colonoscopy with cold biopsy polypectomy First Screening Colonoscopy - Avg.  risk and is 50 yrs.  old or older - No.  Prior Negative Screening - Now for repeat screening. 10 or more years since last screening  History of Adenoma - Now for follow-up colonoscopy & has been > or = to 3 yrs.  N/A  Polyps Removed Today? Yes. ASA CLASS:   Class II INDICATIONS:Average risk patient for colon cancer, Iron Deficiency Anemia, and last colonoscopy 2003 was normal.  history of sarcoidosis. MEDICATIONS: MAC sedation, administered by CRNA and Propofol (Diprivan) 50 mg IV  DESCRIPTION OF PROCEDURE:   After the risks benefits and alternatives of the procedure were thoroughly explained, informed consent was obtained.  A digital rectal exam revealed no abnormalities of the rectum.   The LB [id number]  endoscope was introduced through the anus and advanced to the cecum, which was identified by both the appendix and ileocecal valve. No adverse events experienced.   The quality of the prep was good, using MoviPrep  The instrument was then slowly withdrawn as the colon was fully examined.      COLON FINDINGS: Two sessile polyps ranging between 3-11mm in size were found in the sigmoid colon.  A polypectomy was performed with cold forceps.  The resection was complete and the polyp tissue was completely retrieved.  Retroflexed views revealed no abnormalities. The time to cecum=3 minutes 15 seconds.  Withdrawal time=6 minutes 52 seconds.  The scope was withdrawn and the procedure completed. COMPLICATIONS: There were no complications.  ENDOSCOPIC IMPRESSION: Two sessile polyps ranging between 3-39mm in size  were found in the sigmoid colon; polypectomy was performed with cold forceps  RECOMMENDATIONS: 1.  Await pathology results 2.  high fiber diet Recall colonoscopy pending path report   eSigned:  Lafayette Dragon, MD 05/16/2014 3:04 PM   cc:

## 2014-05-20 ENCOUNTER — Telehealth: Payer: Self-pay | Admitting: *Deleted

## 2014-05-20 LAB — GLUCOSE, CAPILLARY: Glucose-Capillary: 117 mg/dL — ABNORMAL HIGH (ref 70–99)

## 2014-05-20 NOTE — Telephone Encounter (Signed)
  Follow up Call-  Call back number 05/16/2014  Post procedure Call Back phone  # [phone]  Permission to leave phone message Yes     Patient questions:  Do you have a fever, pain , or abdominal swelling? No. Pain Score  0 *  Have you tolerated food without any problems? Yes.    Have you been able to return to your normal activities? Yes.    Do you have any questions about your discharge instructions: Diet   No. Medications  No. Follow up visit  No.  Do you have questions or concerns about your Care? No.  Actions: * If pain score is 4 or above: No action needed, pain <4.

## 2014-05-23 ENCOUNTER — Encounter: Payer: Self-pay | Admitting: Internal Medicine

## 2014-05-28 ENCOUNTER — Telehealth: Payer: Self-pay | Admitting: Pulmonary Disease

## 2014-05-28 DIAGNOSIS — M25569 Pain in unspecified knee: Secondary | ICD-10-CM

## 2014-05-28 NOTE — Telephone Encounter (Signed)
I called spoke with pt. She reports she slipped in the bath tub and hit her knee. She is asking for a referral to orthopedic doc. Please advise SN thanks

## 2014-05-28 NOTE — Telephone Encounter (Signed)
Per SN: okay to refer [place] ortho Dr. Veverly Fells, Dr. Maureen Ralphs. Dr. Maxie Better or Dr. Gerre Pebbles made pt aware. Referral placed. Nothing further needed

## 2014-05-29 ENCOUNTER — Encounter: Payer: Self-pay | Admitting: *Deleted

## 2014-06-10 ENCOUNTER — Ambulatory Visit: Payer: BC Managed Care – PPO | Admitting: Interventional Cardiology

## 2014-06-12 ENCOUNTER — Other Ambulatory Visit: Payer: Self-pay

## 2014-06-12 MED ORDER — METOPROLOL TARTRATE 25 MG PO TABS: 25.0000 mg | ORAL_TABLET | Freq: Two times a day (BID) | ORAL | Status: DC

## 2014-07-03 ENCOUNTER — Telehealth: Payer: Self-pay | Admitting: Pharmacist

## 2014-07-03 DIAGNOSIS — E782 Mixed hyperlipidemia: Secondary | ICD-10-CM

## 2014-07-03 NOTE — Telephone Encounter (Signed)
Amy, please notify patient to restart atorvastatin at low dose of 20 mg once daily.  Recheck lipid panel, hepatic panel, and CK in 4 weeks.   Please notify patient, update meds, and set up labs. Thanks.

## 2014-07-03 NOTE — Telephone Encounter (Signed)
I agree with the lower dose of lipitor.

## 2014-07-03 NOTE — Telephone Encounter (Signed)
Patient is in the [place] study receiving either Evacetrapib or placebo, and also on Lipitor 80 mg qd. Patient has a h/o CAD and diabetes. She enrolled into study 06/2012 and CK was 298 at baseline on lipitor 80 mg qd. Over next few months CK trended up and in 08/2013 it was 754 and the study drug was stopped. Four weeks later CK was 722 so lipitor was also d/c. In 12/2013 CK was 697 despite being off both agents for 2 months. Patient is asymptomatic. Don't have any other CK measurements on file from prior to 06/2012 when she was started on study med. Patient does have a h/o fibromyalgis and sarcoidosis both which could explain some level of elevated CK.  She has remained off statin and study drug since 09/2013.  Today (06/2014) CK is down to 477.  Her LDL is 228 mg/dL.  Given her baseline CK is ~ 300 and her h/o fibromyalgia, I think it is reasonable to restart lipitor but at lower dosage of 20 mg once daily and remain off study drug.  Let me know your thoughts, then we can let both patient and Dr. Valere Dross with [facility] know.  Thanks.

## 2014-07-10 NOTE — Telephone Encounter (Signed)
No answer/ No Vm at home and cell #'s.

## 2014-07-11 MED ORDER — ATORVASTATIN CALCIUM 20 MG PO TABS: 20.0000 mg | ORAL_TABLET | Freq: Every day | ORAL | Status: DC

## 2014-07-11 NOTE — Telephone Encounter (Signed)
Pt notified, rx sent in and labs ordered.

## 2014-07-14 ENCOUNTER — Other Ambulatory Visit: Payer: Self-pay | Admitting: Pulmonary Disease

## 2014-07-19 ENCOUNTER — Ambulatory Visit: Payer: BC Managed Care – PPO | Admitting: Pulmonary Disease

## 2014-08-01 ENCOUNTER — Ambulatory Visit (INDEPENDENT_AMBULATORY_CARE_PROVIDER_SITE_OTHER): Payer: BC Managed Care – PPO | Admitting: Interventional Cardiology

## 2014-08-01 ENCOUNTER — Encounter: Payer: Self-pay | Admitting: Cardiology

## 2014-08-01 ENCOUNTER — Encounter: Payer: Self-pay | Admitting: Interventional Cardiology

## 2014-08-01 VITALS — BP 140/82 | HR 80 | Ht 62.0 in | Wt 201.0 lb

## 2014-08-01 DIAGNOSIS — I2510 Atherosclerotic heart disease of native coronary artery without angina pectoris: Secondary | ICD-10-CM

## 2014-08-01 DIAGNOSIS — E7800 Pure hypercholesterolemia, unspecified: Secondary | ICD-10-CM

## 2014-08-01 DIAGNOSIS — I10 Essential (primary) hypertension: Secondary | ICD-10-CM

## 2014-08-01 DIAGNOSIS — E119 Type 2 diabetes mellitus without complications: Secondary | ICD-10-CM

## 2014-08-01 DIAGNOSIS — I059 Rheumatic mitral valve disease, unspecified: Secondary | ICD-10-CM

## 2014-08-01 DIAGNOSIS — E669 Obesity, unspecified: Secondary | ICD-10-CM

## 2014-08-01 DIAGNOSIS — I340 Nonrheumatic mitral (valve) insufficiency: Secondary | ICD-10-CM

## 2014-08-01 MED ORDER — LOSARTAN POTASSIUM 50 MG PO TABS: 100.0000 mg | ORAL_TABLET | Freq: Every day | ORAL | Status: DC

## 2014-08-01 NOTE — Progress Notes (Signed)
Patient ID: Jocelyn [name], female   DOB: 27-Jan-1956, 58 y.o.   MRN: [id number] Patient ID: Jocelyn [name], female   DOB: [birth year], 58 y.o.   MRN: [id number]    [place], [place] [employer], [place]  [address] Phone: [phone] Fax:  [phone]  Date:  08/01/2014   ID:  Jocelyn [name], DOB 30-Nov-1955, MRN [id number]  PCP:  Noralee Space, MD      History of Present Illness: Jocelyn [name] is a 58 y.o. female who has had mild CAD in a branch vessel. She uses NTG occasionally. Exercise limited by knee problem.   She gets [facility] with this but no CP.  Rare CP. Feels tight in the morning- better as the day goes on.   Lipitor was stopped recently but then restated at lower dose.   BP outside of the MDs office are close to 120/80. CAD/ASCVD:  Denies : Diaphoresis.  Dizziness.  Leg edema.  Palpitations.  Syncope.   Working as Automotive engineer.  Wt Readings from Last 3 Encounters:  08/01/14 201 lb (91.173 kg)  05/16/14 202 lb (91.627 kg)  04/12/14 202 lb (91.627 kg)     Past Medical History  Diagnosis Date  . Meniere's disease, unspecified   . Allergic rhinitis, cause unspecified   . Acute bronchitis   . Sarcoidosis   . Obstructive sleep apnea (adult) (pediatric)   . Essential hypertension, benign   . Coronary atherosclerosis of unspecified type of vessel, native or graft   . Unspecified venous (peripheral) insufficiency   . Pure hypercholesterolemia   . Type II or unspecified type diabetes mellitus without mention of complication, not stated as uncontrolled   . Irritable bowel syndrome   . Calculus of kidney   . Fibromyalgia   . Vitamin D deficiency   . Dizziness   . Anxiety   . Myalgia and myositis, unspecified   . Calculus of kidney   . BV (bacterial vaginosis) 06/22/1996  . H/O varicella   . Headache(784.0)     frequently  . Yeast infection   . Menses, irregular 2003  . Perimenopausal symptoms 2003  . Fibroid 2003  . H/O dysmenorrhea 2008  . HSV-2 infection 2009    . Vulvitis 2010  . B12 deficiency   . Anemia     Current Outpatient Prescriptions  Medication Sig Dispense Refill  . ALPRAZolam (XANAX) 0.5 MG tablet take 1/2 tablet by mouth MID MORNING AND MID AFTERNOON, and 1 tablet by mouth at bedtime  60 tablet  4  . aspirin 81 MG tablet Take 81 mg by mouth daily.        [name] atorvastatin (LIPITOR) 20 MG tablet Take 1 tablet (20 mg total) by mouth daily.  30 tablet  6  . BELVIQ 10 MG TABS       . Cyanocobalamin 1000 MCG/ML KIT Inject 1,000 mg as directed every 30 (thirty) days.      . ferrous sulfate 325 (65 FE) MG tablet Take 325 mg by mouth 2 (two) times daily with a meal.       . furosemide (LASIX) 20 MG tablet Take 2 tablets by mouth every morning  60 tablet  3  . losartan (COZAAR) 50 MG tablet take 1 tablet by mouth once daily  30 tablet  4  . meclizine (ANTIVERT) 25 MG tablet Take 25 mg by mouth. 1/2 to 1 tab by mouth every 4 hours as needed for dizziness       .  metoprolol tartrate (LOPRESSOR) 25 MG tablet Take 1 tablet (25 mg total) by mouth 2 (two) times daily.  60 tablet  6  . nitroGLYCERIN (NITROSTAT) 0.4 MG SL tablet Place 1 tablet (0.4 mg total) under the tongue every 5 (five) minutes as needed for chest pain.  25 tablet  5  . ondansetron (ZOFRAN) 4 MG tablet Take 1 tablet (4 mg total) by mouth every 8 (eight) hours as needed for nausea or vomiting.  20 tablet  1  . pantoprazole (PROTONIX) 40 MG tablet Take 1 tablet (40 mg total) by mouth daily.  180 tablet  1  . Saxagliptin-Metformin (KOMBIGLYZE XR) 03-999 MG TB24 Take 1 tablet by mouth once.      . sodium chloride (OCEAN NASAL SPRAY) 0.65 % nasal spray 1 spray by Nasal route as needed.        . valACYclovir (VALTREX) 500 MG tablet Take 500 mg by mouth daily.       . vitamin C (ASCORBIC ACID) 500 MG tablet Take 500 mg by mouth 2 (two) times daily. With the iron tablets      . diltiazem (CARDIZEM CD) 240 MG 24 hr capsule take 1 capsule by mouth once daily  30 capsule  4   No current  facility-administered medications for this visit.    Allergies:    Allergies  Allergen Reactions  . Actos [Pioglitazone]     REACTION: pt states INTOL w/ edema  . Codeine     REACTION: vomiting  . Januvia [Sitagliptin] Nausea Only  . Lactose Intolerance (Gi)   . Morphine Nausea Only    REACTION: vomiting  . Sitagliptin Phosphate     REACTION: nausea    Social History:  The patient  reports that she has never smoked. She has never used smokeless tobacco. She reports that she does not drink alcohol or use illicit drugs.   Family History:  The patient's family history is negative for Colon cancer, Esophageal cancer, Rectal cancer, and Stomach cancer. She was adopted.   ROS:  Please see the history of present illness.  No nausea, vomiting.  No fevers, chills.  No focal weakness.  No dysuria.    All other systems reviewed and negative.   PHYSICAL EXAM: VS:  BP 140/82  Pulse 80  Ht $R'5\' 2"'SM$  (1.575 m)  Wt 201 lb (91.173 kg)  BMI 36.75 kg/m2 Well nourished, well developed, in no acute distress HEENT: normal Neck: no JVD, no carotid bruits Cardiac:  normal S1, S2; RRR;  Lungs:  clear to auscultation bilaterally, no wheezing, rhonchi or rales Abd: soft, nontender, no hepatomegaly Ext: no edema Skin: warm and dry Neuro:   no focal abnormalities noted      ASSESSMENT AND PLAN:  Coronary atherosclerosis of native coronary artery  Continue Lopressor Tablet, 25 MG, 1 tablet, Orally, 1 tablet in am and 1 tablet in PM Notes: Branch vessel disease. She is off of Ranexa. No angina. Restart Imdur if CP returns. Hold off Imdur at this time. She had headaches in the past with this medicine.  CP now is atypical and does not sound cardiac.    2. Benign hypertensive heart disease without heart failure  Continue Cardizem CD Capsule Extended Release 24 Hour, 240 MG, 1 capsule, Orally, once a day Off Amlodipine Besylate Tablet, 5 MG, 1 tablet, Orally, Once a day Notes: .Above target. Increase  losartan to 100 mg daily. Was better on amlodipine.  Swelling persisting off of amlodipine.    3. Hypercholesteremia,  pure  stopped Lipitor Tablet, 80 MG, 1 tablet, Orally, once a day stopped Co Q-10 Capsule, 200 MG, 1 capsule with a meal, Orally, Once a day Notes: She is in a [employer] study. Did not tolerate Zetia.Lipitor stopped this due to leg pain, increased CPK. . She had increased CPK off of Lipitor as well.  I encouraged her to stay in the [place] study.  Back on low dose atorvastatin.   4. obesity: OK to start Belviq.  Check echo to follow MR which was mild in 2010. Refer to nutritionist.  5. Diabetes- managed by PMD. Preventive Medicine  Adult topics discussed:  Diet: healthy diet.  Exercise: at least 30 minutes of aerobic exercise, 5 days a week.      Signed, Mina Marble, MD, [facility] 08/01/2014 4:08 PM

## 2014-08-01 NOTE — Patient Instructions (Addendum)
Your physician has recommended you make the following change in your medication:   1. Increase Losartan 50 mg to 2 tablets daily.   Your physician recommends that you return for lab work on 08/14/14.  Your physician has requested that you have an echocardiogram. Echocardiography is a painless test that uses sound waves to create images of your heart. It provides your doctor with information about the size and shape of your heart and how well your heart's chambers and valves are working. This procedure takes approximately one hour. There are no restrictions for this procedure.  Your physician wants you to follow-up in: 6 months with Dr. Irish Lack. You will receive a reminder letter in the mail two months in advance. If you don't receive a letter, please call our office to schedule the follow-up appointment.  You have been referred to a Nutritionist.

## 2014-08-14 ENCOUNTER — Ambulatory Visit (HOSPITAL_COMMUNITY): Payer: BC Managed Care – PPO | Attending: Cardiovascular Disease | Admitting: Radiology

## 2014-08-14 ENCOUNTER — Other Ambulatory Visit (INDEPENDENT_AMBULATORY_CARE_PROVIDER_SITE_OTHER): Payer: BC Managed Care – PPO

## 2014-08-14 DIAGNOSIS — I059 Rheumatic mitral valve disease, unspecified: Secondary | ICD-10-CM | POA: Insufficient documentation

## 2014-08-14 DIAGNOSIS — E785 Hyperlipidemia, unspecified: Secondary | ICD-10-CM | POA: Diagnosis not present

## 2014-08-14 DIAGNOSIS — I10 Essential (primary) hypertension: Secondary | ICD-10-CM

## 2014-08-14 DIAGNOSIS — E669 Obesity, unspecified: Secondary | ICD-10-CM | POA: Diagnosis not present

## 2014-08-14 DIAGNOSIS — E119 Type 2 diabetes mellitus without complications: Secondary | ICD-10-CM | POA: Insufficient documentation

## 2014-08-14 DIAGNOSIS — I340 Nonrheumatic mitral (valve) insufficiency: Secondary | ICD-10-CM

## 2014-08-14 DIAGNOSIS — E782 Mixed hyperlipidemia: Secondary | ICD-10-CM

## 2014-08-14 LAB — HEPATIC FUNCTION PANEL
ALT: 26 U/L (ref 0–35)
AST: 31 U/L (ref 0–37)
Albumin: 4 g/dL (ref 3.5–5.2)
Alkaline Phosphatase: 95 U/L (ref 39–117)
BILIRUBIN DIRECT: 0 mg/dL (ref 0.0–0.3)
BILIRUBIN TOTAL: 0.5 mg/dL (ref 0.2–1.2)
Total Protein: 7.5 g/dL (ref 6.0–8.3)

## 2014-08-14 LAB — LIPID PANEL
CHOL/HDL RATIO: 4
Cholesterol: 167 mg/dL (ref 0–200)
HDL: 46.9 mg/dL (ref >39.00)
NonHDL: 120.1
Triglycerides: 215 mg/dL — ABNORMAL HIGH (ref 0.0–149.0)
VLDL: 43 mg/dL — ABNORMAL HIGH (ref 0.0–40.0)

## 2014-08-14 LAB — BASIC METABOLIC PANEL
BUN: 15 mg/dL (ref 6–23)
CO2: 28 meq/L (ref 19–32)
Calcium: 9.5 mg/dL (ref 8.4–10.5)
Chloride: 106 mEq/L (ref 96–112)
Creatinine, Ser: 0.8 mg/dL (ref 0.4–1.2)
GFR: 99.9 mL/min (ref >60.00)
GLUCOSE: 111 mg/dL — AB (ref 70–99)
POTASSIUM: 3.9 meq/L (ref 3.5–5.1)
Sodium: 141 mEq/L (ref 135–145)

## 2014-08-14 LAB — CK: Total CK: 737 U/L — ABNORMAL HIGH (ref 7–177)

## 2014-08-14 LAB — LDL CHOLESTEROL, DIRECT: Direct LDL: 91.6 mg/dL

## 2014-08-14 NOTE — Progress Notes (Signed)
Echocardiogram performed.  

## 2014-08-15 ENCOUNTER — Ambulatory Visit: Payer: BC Managed Care – PPO | Admitting: Pulmonary Disease

## 2014-08-19 ENCOUNTER — Ambulatory Visit: Payer: BC Managed Care – PPO | Admitting: Adult Health

## 2014-08-20 ENCOUNTER — Telehealth: Payer: Self-pay | Admitting: Cardiology

## 2014-08-20 DIAGNOSIS — R748 Abnormal levels of other serum enzymes: Secondary | ICD-10-CM

## 2014-08-20 NOTE — Telephone Encounter (Signed)
Message copied by Alcario Drought on Tue Aug 20, 2014  5:32 PM ------      Message from: Jettie Booze      Created: Tue Aug 20, 2014  5:24 PM       OK to refer to rheumatology ------

## 2014-08-20 NOTE — Telephone Encounter (Signed)
Pt notified. Referral placed

## 2014-08-29 ENCOUNTER — Telehealth: Payer: Self-pay | Admitting: Pulmonary Disease

## 2014-08-29 MED ORDER — AZITHROMYCIN 250 MG PO TABS: ORAL_TABLET | ORAL | Status: DC

## 2014-08-29 NOTE — Telephone Encounter (Signed)
Per SN---  zpak #1  Take as directed  mucinex 2 po bid Fluids Delsym 2 tsp bid Throat lozenges   i called and spoke with pt and she is aware.  Nothing further is needed.

## 2014-08-29 NOTE — Telephone Encounter (Signed)
Called and spoke with pt and she stated that she has been sick x 3 days.  She has congestion, cough and hoarseness.  She has been using the mucinex but this does not seem to be helping.  SN please advise. Thanks  Allergies  Allergen Reactions  . Actos [Pioglitazone]     REACTION: pt states INTOL w/ edema  . Codeine     REACTION: vomiting  . Januvia [Sitagliptin] Nausea Only  . Lactose Intolerance (Gi)   . Morphine Nausea Only    REACTION: vomiting  . Sitagliptin Phosphate     REACTION: nausea    Current Outpatient Prescriptions on File Prior to Visit  Medication Sig Dispense Refill  . ALPRAZolam (XANAX) 0.5 MG tablet take 1/2 tablet by mouth MID MORNING AND MID AFTERNOON, and 1 tablet by mouth at bedtime  60 tablet  4  . aspirin 81 MG tablet Take 81 mg by mouth daily.        [name] atorvastatin (LIPITOR) 20 MG tablet Take 1 tablet (20 mg total) by mouth daily.  30 tablet  6  . BELVIQ 10 MG TABS       . Cyanocobalamin 1000 MCG/ML KIT Inject 1,000 mg as directed every 30 (thirty) days.      [name] diltiazem (CARDIZEM CD) 240 MG 24 hr capsule take 1 capsule by mouth once daily  30 capsule  4  . ferrous sulfate 325 (65 FE) MG tablet Take 325 mg by mouth 2 (two) times daily with a meal.       . furosemide (LASIX) 20 MG tablet Take 2 tablets by mouth every morning  60 tablet  3  . losartan (COZAAR) 50 MG tablet Take 2 tablets (100 mg total) by mouth daily.  30 tablet  4  . meclizine (ANTIVERT) 25 MG tablet Take 25 mg by mouth. 1/2 to 1 tab by mouth every 4 hours as needed for dizziness       . metoprolol tartrate (LOPRESSOR) 25 MG tablet Take 1 tablet (25 mg total) by mouth 2 (two) times daily.  60 tablet  6  . nitroGLYCERIN (NITROSTAT) 0.4 MG SL tablet Place 1 tablet (0.4 mg total) under the tongue every 5 (five) minutes as needed for chest pain.  25 tablet  5  . ondansetron (ZOFRAN) 4 MG tablet Take 1 tablet (4 mg total) by mouth every 8 (eight) hours as needed for nausea or vomiting.  20 tablet  1   . pantoprazole (PROTONIX) 40 MG tablet Take 1 tablet (40 mg total) by mouth daily.  180 tablet  1  . Saxagliptin-Metformin (KOMBIGLYZE XR) 03-999 MG TB24 Take 1 tablet by mouth once.      . sodium chloride (OCEAN NASAL SPRAY) 0.65 % nasal spray 1 spray by Nasal route as needed.        . valACYclovir (VALTREX) 500 MG tablet Take 500 mg by mouth daily.       . vitamin C (ASCORBIC ACID) 500 MG tablet Take 500 mg by mouth 2 (two) times daily. With the iron tablets       No current facility-administered medications on file prior to visit.

## 2014-09-06 ENCOUNTER — Telehealth: Payer: Self-pay | Admitting: Pulmonary Disease

## 2014-09-09 ENCOUNTER — Ambulatory Visit (INDEPENDENT_AMBULATORY_CARE_PROVIDER_SITE_OTHER): Payer: BC Managed Care – PPO | Admitting: Pulmonary Disease

## 2014-09-09 ENCOUNTER — Encounter: Payer: Self-pay | Admitting: Pulmonary Disease

## 2014-09-09 VITALS — BP 138/80 | HR 80 | Temp 98.5°F | Ht 62.0 in | Wt 202.4 lb

## 2014-09-09 DIAGNOSIS — K2100 Gastro-esophageal reflux disease with esophagitis, without bleeding: Secondary | ICD-10-CM

## 2014-09-09 DIAGNOSIS — I10 Essential (primary) hypertension: Secondary | ICD-10-CM

## 2014-09-09 DIAGNOSIS — F411 Generalized anxiety disorder: Secondary | ICD-10-CM

## 2014-09-09 DIAGNOSIS — E7800 Pure hypercholesterolemia, unspecified: Secondary | ICD-10-CM

## 2014-09-09 DIAGNOSIS — D509 Iron deficiency anemia, unspecified: Secondary | ICD-10-CM

## 2014-09-09 DIAGNOSIS — J302 Other seasonal allergic rhinitis: Secondary | ICD-10-CM

## 2014-09-09 DIAGNOSIS — I2510 Atherosclerotic heart disease of native coronary artery without angina pectoris: Secondary | ICD-10-CM

## 2014-09-09 DIAGNOSIS — E119 Type 2 diabetes mellitus without complications: Secondary | ICD-10-CM

## 2014-09-09 DIAGNOSIS — J069 Acute upper respiratory infection, unspecified: Secondary | ICD-10-CM

## 2014-09-09 DIAGNOSIS — K589 Irritable bowel syndrome without diarrhea: Secondary | ICD-10-CM

## 2014-09-09 DIAGNOSIS — IMO0001 Reserved for inherently not codable concepts without codable children: Secondary | ICD-10-CM

## 2014-09-09 DIAGNOSIS — M609 Myositis, unspecified: Secondary | ICD-10-CM

## 2014-09-09 DIAGNOSIS — I872 Venous insufficiency (chronic) (peripheral): Secondary | ICD-10-CM

## 2014-09-09 DIAGNOSIS — M791 Myalgia: Secondary | ICD-10-CM

## 2014-09-09 DIAGNOSIS — D869 Sarcoidosis, unspecified: Secondary | ICD-10-CM

## 2014-09-09 MED ORDER — AZITHROMYCIN 250 MG PO TABS: ORAL_TABLET | ORAL | Status: DC

## 2014-09-09 NOTE — Patient Instructions (Signed)
Today we updated your med list in our EPIC system...    Continue your current medications the same...  We refilled your ZPak for as needed use...  Today we did a Pulmonary function Test >>    This test showed   We wrote a prescription for a Shingles vaccine...    You may get this shot at any time thru [facility] or [facility] shot clinics...  Let's get on tract w/ our diet & exercise program...    You know the low cholesterol/ low fat diet...    To lose the wt you also need a low carb diet...  Call for any questions...  Let's plan a follow up visit in 13mo, sooner if needed for problems.Marland KitchenMarland Kitchen

## 2014-09-09 NOTE — Progress Notes (Addendum)
Subjective:    Patient ID: Jocelyn [name], female    DOB: 02-14-1956, 58 y.o.   MRN: [id number]  HPI 58 y/o BF here for a follow up visit... she has multiple medical problems including:  AR & Asthma;  Hx Sarcoid;  Mild OSA;  HBP;  CAD;  Ven Insuffic;  Hyperchol;  DM;  GERD/ IBS;  Hx Kidney stones;  FM;  Vit D defic;  Anxiety...  ~  September 08, 2011:  54moROV & she has been reasonably stable, no new complaints or concerns;     AR/ AB/ Hx Sarcoid> on Xopenex Prn, Mucinex & MMW; denies cough, sputum, hemoptysis, ch in dyspnea, chest discomfort...    HBP/ CAD> Followed by DrTTurner on ASA81, Imdur120, ToprolXL75, Cardizem240, Lasix20; BP= 120/80, P= 78/reg; denies CP, palpit, syncope, SOB, edema; she reports check up 7/12 (we don't have note) & doing fine, no changes made...    CHOL> on Lip40, Zetia10, CoQ10; she has lost 9# on diet down to 186# today; FLP here 4/12 was wnl & reviewed w/ pt...    DM> Followed by DrEllison on Metform500Bid, Onglyza2.5, Parlodel2.5hs; A1c= 7.2 in July and repeated today at her request= 6.7; despite this good control she not happy w/ her care & requests second opinion from [place]we will refer...    GI> GERD, IBS> on Protonix40; denies abd pain, n/v, d/c/blood...    Anxiety> on Xanax0.551mPrn, she was INTOL to Zoloft...  ~  June 02, 2012:  55m655moV & Breanda says she is doing well w/o new complaints or concerns...    AR/ AB/ Hx Sarcoid> on Mucinex & MMW prn; denies cough, sputum, hemoptysis, ch in dyspnea, chest discomfort; she has SOB/DOE but is too sedentary & needs to incr exercise program...    HBP/ CAD> Followed by DrTTurner on ASA81, Imdur120, ToprolXL75, Cardizem240, Lasix20;  BP= 110/68 & denies CP, palpit, syncope, SOB, edema; she reports check up 5/13 (we don't have note) & they added RANEXA 500m25md for her CP...    CHOL> on Lip40, Zetia10, CoQ10; she has lost 3# on diet down to 183# today; FLP is followed by DrTTurner per pt but we don't have  copies; pt reports concern for "particle size" & she was rec to enter into a drug trial...    DM> Now followed by [name] on Metform500Bid & Onglyza2.5; last A1c here= 6.7; we don't have notes from DrKeBurke Centree reports doing ok...    GI> GERD, IBS> on Protonix40; denies abd pain, n/v, d/c/blood...    Anxiety> on Xanax0.5mg 57m, she was INTOL to Zoloft & on Lexapro10mg 67mec to incr to 20mg t55m... She saw DrDillard for GYN 5/13> mult menopausal symptoms & she was started on Prempro...  LABS 7/13 by [name]> FLP- at goals on Lip+Zetia;  Chems- wnl;  CBC- Hg=12.4, Ferritin low at 14.9, B12= 211; TSH=1.53;   ~  December 04, 2012:  78mo ROV74moenise has mult somatic complaints and they all seem to revolve around not resting well & aching/ sore/ tender in chest wall; we reviewed FM diagnosis which she has carried for yrs and prev saw [name]- Rec trial Lunesta 2mgQhs &65mamadol Tid prn... We reviewed the following medical problems during today's office visit >>     AR/ AB/ Hx Sarcoid> on Mucinex & MMW prn; denies cough, sputum, hemoptysis, ch in dyspnea- she has SOB/DOE but is too sedentary & needs to incr exercise program; her chest discomfort sounds like CWP from  her FM & we discussed FM rx below...    HBP/ CAD> on ASA81, Imdur120, ToprolXL75, Cardizem240, Lasix20prn (seldom takes); BP=128/80 & she has mult somatic c/o- CWP, tired/no energy, but denies palpit, ch in SOB, edema, etc; followed by [facility]- DrTurner/ [name] & seen recently but we don't have note; they prev added RANEXA 558m Bid for her CP (now off this med)...    CHOL> on Lip40, Zetia10, CoQ10; she has gained 4# on diet down to 186# today; FLP is followed by ES[facility]ie [facility] per pt- pt reports concern for "particle size" & she was rec to enter into a drug trial; last FLP 7/13 showed TChol 137, TG 89, HDL 42, LDL 73...    DM> followed by [name] on Metform500Bid & Onglyza2.5; last labs from DPurcell9/13 showed BS=201, A1c=6.0; he rec same  meds...    GI> GERD, IBS> on Protonix40; denies abd pain, n/v, d/c/blood...    FM> This is her CC w/ not resting well, wakes tired, poor energy, aching/ sore/ tender (esp in chest in AM) & we discussed trial Lunesta 244mhs, & Tramadol 5016md; plus rest, heat, etc... States she's INTOL Ambien w/ weird dreams & Benedryl/ Melatonin w/o help...    Anxiety> on Xanax0.5mg54mn, she was INTOL to Zoloft & on Lexapro10mg23mrec to incr to 20mg 13my...    MISC> she tells me that [name] treated her low VitD w/ 50K weekly, then switched to daily OTC supplement; she says he wants me to address her other labs that he did 7/13- Ferritin sl low at 14.9 (Rec FeSO4 325mg/d10m12 borderline low at 211 (Rec OTC Vit B12 1000mg da80m & we will f/u labs later... We reviewed prob list, meds, xrays and labs> see below for updates >> LABS per [name], [facility]...  CXR 1/14 showed cardiomeg, clear lungs, no adenopathy, mild scoliosis...   ~  August 08, 2013:  21mo ROV 31monise haTamiyahral issues to discuss> 1) c/o sinus congestion- unresponsive to her OTC meds & rec to use Nasal saline mist, Mucinex 1200mgBid, 63mds, and we will treat w/ ZPak & Depo80;  2) told by [name] that Iron was low & needed f/u here but we don't have these labs from him- repeat here today shows Hg=12.5, MCV=88, Fe=47 (12%sat), Ferritin=10.8; she is due for f/u colon w/ [name] & we will refer, rec to start FeSO4 325mg Bid w26mtC500 w/ plans for f/u CBC, Fe studies in 2-30mo;  She c70monues to f/u w/ [name] regularly- We reviewed the following medical problems during today's office visit >>     AR/ AB/ Hx Sarcoid> on Mucinex & MMW prn; denies cough, sputum, hemoptysis, or ch in dyspnea- she has SOB/DOE but is too sedentary & needs to incr exercise program; some CWP from her FM & we discussed FM rx below...    HBP/ CAD> on ASA81, Imdur120, Toprol50-1/2Bid, Cardizem240, Lasix20prn (seldom takes) & ?Amlodip5 from [facility]?; BP=[employer] & she has mult  somatic c/o- CWP, tired/no energy, but denies palpit, ch in SOB, edema, etc; [facility]/ [name], no recent notes.    CHOL> on Lip40 => notes from Kerr says ?[employer] [employer] +[facility] study drug?; she has gained 4# to 190# today; [facility] & she reports concern for "particle size" & last avail FLP was 7/14 by [name] w/ TChol 190, HDL 100, LDL 40    DM> followed by [name] on Metform500Bid & Onglyza2.5 => ?he changed to Kombiglyze5-1000 daily?; last  labs from [place] 7/14 showed BS=105, A1c=6.5; and she remains on the same meds...    GI> GERD, IBS> on Protonix40; denies abd pain, n/v, d/c, or blood seen; last colonoscopy was 7/03 by [name] and reported wnl; f/u due now esp in light of her low Ferritin level...    FM> This is her CC w/ not resting well, wakes tired, poor energy, aching/ sore/ tender (esp in chest in AM) & we discussed the importance of a good night's rest, plus Tramadol 83mTid; States she's INTOL Ambien w/ weird dreams & Benedryl/ Melatonin w/o help...    Anxiety> on Xanax0.523mPrn, she was INTOL to Zoloft & she stopped prev Lexapro Rx...    Vit D defic> she tells me that [name] treated her low VitD w/ 50K weekly, then switched to daily OTC supplement...    Vit B12 defic> [name] has her on B12 shots (weekly x4, then monthly) for B12 level done 8/14 = 154...    Borderline anemia & low Ferritin> pt reports that labs from DrMillerhowed this & he wanted usKoreao eval & treat; Labs 9/14 showed Hg= 12.5, MCV=88, Fe=47 (12%sat), Ferritin=10.8; she is due for f/u colon w/ [name] & we will refer, rec to start FeSO4 32545mid w/ VitC500 w/ plans for f/u CBC, Fe studies in 2-61mo70moWe reviewed prob list, meds, xrays and labs> see below for updates >> SHE DID NOT BRING MED BOTTLES OR CURRENT MED LIST TO THE OV TODAY!!! She is not clear on wat meds have been changed by [name] etalHarriett Sine ~  February 13, 2014:  103mo 6103mo& [place] has mult somatic complaints-  recent gastroenteritis (on Zofran & Immodium), c/o swelling all over "they don't know what it is" states she watches salt & exercises by walking 3-4d/wk... We reviewed the following medical problems during today's office visit >>     AR/ AB/ Hx Sarcoid> on Mucinex & MMW prn; denies cough, sputum, hemoptysis, or ch in dyspnea- she has SOB/DOE but is too sedentary & needs to incr exercise program; some CWP from her FM & we discussed FM rx below...    HBP/ CAD> on ASA81, Toprol50-1/2Bid, Cardizem240, Lasix20prn (ave 1/d) & ?Amlodip5 from Kerr?[employer]=124/76 & she has mult somatic c/o- CWP, tired/no energy, but denies palpit, ch in SOB, edema, etc; followed by [facility]- [name], no recent notes.    CHOL> off Lipitor => notes from [facility] Bud[employer] Duty ?Lip80 +[facility] study drug? (both stopped due to elev CPK); she has gained 8# to 198# today; [facility] & she reports concern for "particle size" & last avail FLP was 7/14 by [name] w/ TChol 190, HDL 100, LDL 40...    DM> followed by [name] on Metform500Bid & Onglyza5 => ?he changed to Kombiglyze5-1000 daily?; [place] showed BS=105, A1c=6.5; and she remains on the same meds...    GI> GERD, IBS> on Protonix40; denies abd pain, n/v, d/c, or blood seen; last colonoscopy was 7/03 by [name] and reported wnl; f/u due now esp in light of her low Ferritin level...    FM> This is her CC w/ not resting well, wakes tired, poor energy, aching/ sore/ tender (esp in chest in AM) & we discussed the importance of a good night's rest- states she's INTOL Ambien w/ weird dreams & Benedryl/ Melatonin w/o help...    Anxiety> on Xanax0.5mg P29m she was INTOL to Zoloft & she stopped prev Lexapro Rx...    Vit D  defic> she tells me that [name] treated her low VitD w/ 50K weekly, then switched to daily OTC supplement...    Vit B12 defic> [name] has her on B12 shots (weekly x4, then monthly) for B12 level done 8/14 = 154...    Borderline anemia &  low Ferritin> pt reports that labs from [place] showed this & he wanted [name] to eval & treat; Labs 9/14 showed Hg= 12.5, MCV=88, Fe=47 (12%sat), Ferritin=10.8; she is due for f/u colon w/ [name] & we will refer, rec to start FeSO4 335m Bid w/ VitC500 w/ plans for f/u CBC- done 4/15 w/ Hg=12.5, Fe=31 (8%sat) but only taking Iron once daily; rec to increase to Bid... We reviewed prob list, meds, xrays and labs> see below for updates >>   CXR 4/15 showed mild cardiomeg, clear lungs, NAD...  LABS 4/15:  Chems- wnl;  CBC- ok w/ Hg=12.5 but Fe=31 (8%);  TSH=1.62;  ACE=56 (8-52);  BNP=26...  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  In the interval she has lost 1#,  BP= 134/80, & she has started FeSO4 1-2/d & GI appt w/ [name] is still pending...  She notes some constip on the Fe and we will rec Ferrosequels or similar product + laxatives prn...  ~  September 09, 2014:  45m58moV & here for pulm follow up>  She has hx old Sarcoid in 1980's, no activity x yrs; CXR 4/15 stable, NAD;  She tells me that her daughter noted her to be wheezing this past summer, usually w/ activ but sometimes on the phone; she had recent URI, toook ZPak & improved;  Exam shows scat rhonchi and we discussed need for MUCINEX 1200m28m, Fluids, etc;  PFT today is ok ?min restriction, no signif obstructive dis...    [facility]> seen 9/15 w/ hx HBP, mild CAD in branch vessel, unable to exercise due to knees, c/o SOB but no CP; he increased her Losartan to 100mg82m Currently taking ASA81, Metoprolol25Bid, DiltiazemCD240, Losartan50-2/d, Lasix20-2Qam; BP= 138/80 & she denies CP, palpit, ch in SOB, edema, etc...      Lipid status is complex> she was on Liptor but this was stopped due to elev musc enzymes; then she entered drug study thru [facility] but apparently musc enz were elevated there too & med was stopped after a short time, subseq serial musc enz remained elev according to [name]; he challenged her w/ Lip10 & had to stop that too- he has referred her to Rheum but she hasn't gone yet; I have encouraged her to pursue Rheum consultation in light of elev musc enz off all statin & chol meds; she denies weakness but had some leg pain on statin...     She tried Belviq per [name], ok'd by [name]- but she tells me that it made her "feel funny" therefore she stopped it after just 2wks; wt is up 5# to 202# in our office today...    She continues regular f/u w/ [name] for Endocrine> DM & B12 defic;  last note in Epic is 4/15 w/ BS=85, A1c=6.1 on Kombiglyze 03-999 daily and B12 shots 1000mcg74mthly...     She had  GI f/u [name] 5/15> iron defic anemia, IBS, constip from Fe; she did EGD 7/15 showing gastric polyp in cardia (path=hyperplastic); and Colonoscopy 7/15 showing 2 sessile polyps in sigmoid (path=tubular adenoma) & rec to take Protonix40 & hi fiberr diet. We reviewed prob list, meds, xrays and labs> see below for updates >>   CXR 4/15 showed mild cardiomeg, clear lungs, NAD.Marland KitchenMarland Kitchen  EKG 9/15 showed NSR, rate80, 1st degree AVB, otherw norm EKG...  2DEcho 9/15 showed mild LVH, norm LVF w/ EF=60-65%, norm wall motion, Gr1DD, norm valves w/ trivMR...   PFT 10/15 showed FVC=1.93 (79%), FEV1=1.55 (79%), %1sec=80%, mid-flows=70% predicted...          Problems List:  MENIERE'S DISEASE (ICD-3 - eval by ENT, DrBates- Rx w/ diuretic, low salt, Xanax vs Valium, & Antivert Prn... dizziness recurred & ENT sent her to [facility] 7/11- we don't have notes but pt reports that nothing was found.  ALLERGIC RHINITIS - increased allergy symptoms in the spring... rec> Claritin, Saline, Flonase...  Hx of ASTHMATIC BRONCHITIS, ACUTE  -  requires occas Antibiotics and Medrol for infectious exac- last Oct09 & resolved w/ Avelox/ Depo/ Medrol... has not been on regular inhalers, but uses XOPENEX MDI (w/ spacer), [place] Prn... ~  essentially neg CTChest 9/08 after CXR suggested RULnodule. ~  CXR 10/09 showed cardiomegaly, clear lungs, min scoliosis, NAD. ~  CXR 9/11 showed cardiomeg, clear lungs, NAD.[name]. ~  CXR 1/14 showed cardiomeg, clear lungs, no adenopathy, mild scoliosis... ~  CXR 4/15 showed mild cardiomeg, clear lungs, NAD... ~  PFT 10/15 showed FVC=1.93 (79%), FEV1=1.55 (79%), %1sec=80%, mid-flows=70% predicted...   Hx of SARCOIDOSIS  - Dx'd 1980's w/ bronch bx... Rx Pred transiently and improved... no active disease x years... ~  baseline CXRs showed cardiomegaly, clear lungs, min scoliosis, NAD.  Hx of Mild OBSTRUCTIVE SLEEP APNEA - sleep eval DrClance 2006 w/ RDI= 5 only.   FOLLOWED by [facility] FOR CARDIOLOGY >>   HYPERTENSION, BENIGN  - on TOPROL XL 57m- 1.5 tabs daily, CARDIZEM 2458md, LASIX 2054must as needed now... ~  prev OV's 130-140's / 80's... prev noted sl HA, chest tightness, sl SOB, & mult somatic complaints... Labs & renal- all WNL. ~  4/12:  BP=122/76 today, and similar at home per pt... She denies CP, palpit, SOB, edema, etc... Labs & renal- all WNL. ~  1/14: on ASA81, Imdur120, ToprolXL75, Cardizem240, Lasix20prn (seldom takes); BP=128/80 & she has mult somatic c/o- CWP, tired/no energy, but denies palpit, ch in SOB, edema, etc; followed by [facility]- DrTurner/ [name] & seen recently but we don't have note; they prev added RANEXA 500m51md for her CP (now off this med); we discussed trial Lunesta 2mgQ18mfor sleep & Tramadol 50mgT59mor pain... ~  9/14:  on ASA81, Imdur120, Toprol50-1/2Bid, Cardizem240, Lasix20prn (seldom takes); BP=132/74 & she has mult somatic c/o- CWP, tired/no energy, but denies palpit, ch in SOB, edema, etc; followed by [facility]- DrTurner/ [name], no recent  notes ~  4/15:  on ASA81, Toprol50-1/2Bid, Cardizem240, Lasix20prn (ave 1/d) & ?Amlodip5 from [facility]?;[employer]124/76 & she has mult somatic c/o- CWP, tired/no energy, but denies palpit, ch in SOB, edema, etc; followed by [facility]- [name], no recent notes.  CORONARY ARTERY DISEASE - on ASA 81mg/d31mDUR 120mg/d,67mrol XL, Cardizem, Lasix as above... Followed by DrTTurner. ~  Cath in 2000 showed non-obstructive CAD (20-30% lesions in all 3 vessels) w/ rec for risk factor modification.   ~  NuclearStressTest 4/07 showed no ischemia &  EF=73%. ~  Mar10:  she's concerned about BP and chest tightness, requests Cardiac eval DrTurner & we will refer... ~  2DEcho 4/10 showed norm LVF w/ EF=55-60%, norm MV, norm AoV, Gr1DD ~  4/10 eval by DrTurner w/ MYOVIEW- neg- breast attenuation, no regional wall motion abn, EF= 75% ~  4/10 Cath by DrTurner w/ 90% small 2nd diag branch of LAD <too sm for PTCA> & luminal irreg up to 20% in RCA, +DD- Med Rx. ~  She had more chest tightness & another Myoview from DrTurner 8/12- it too was neg, no ischemia, no infarct; +breast attenuation; EF=70% & nno regional wall motion abnormalities;  IMDUR has been incr to 112m/d. ~  1/14: [facility]> EKG sent showed NSR, rate72, wnl, NAD... ~  EKG 5/14 showed NSR, rate 93, WNL, NAD... ~  12/14: she saw [name] for Cards f/u (note reviewed)> HBP, CAD, Chol; off Imdur w/o angina, continue same meds including both Cardizem & Amlodipine, encouraged to continue [facility] study...  VENOUS INSUFFICIENCY - [facility]WWoods and seen by DrSevier 6/08= ven insuffic Rx w/ TED's, no salt, elevation, etc. ~  10/10: notes bilat ankle ?lipoma in front of the lat malleoli- asymptomatic but several people have noticed them and she request refer to Ortho (rec DrBednarz when she is ready).  HYPERCHOLESTEROLEMIA  - on LIPITOR 473md & ZETIA 1072m +  CoQ10 per DrTurner... Diet & exercise stressed to the pt. ~  FLP here 12/07 on Lip10 showed TChol 156, TG 94, HDL 51, LDL 86 ~  FLP 1/09 on Lip10 showed TChol 157, TG 122, HDL 49, LDL 84 ~  [facility] showed TChol 176, TG 116, HDL 55, LDL 98 ~  FLP 3/10 on Lip10 showed TChol 170, TG 94, HDL 59, LDL 93 ~  FLP 6/10 by DrTurner on Lip20 showed TChol 134, TG 82, HDL 55, LDL 73 ~  FLP 4/11 here on Lip20+Zetia? showed TChol 145, TG 74, HDL 68, LDL 63 ~  9/11:  she reports that DrTurner incr Lipitor to 35m62mlus the Zetia 10mg41md she does her FLPs. ~  FLP here 4/12 on Lip40+Zetia showed TChol 153, TG 54, HDL 53, LDL 90... Copy sent to DrTurner ~  FLP bLawrencerKerr 7/13 on Lip40+Zetia10 showed TChol 137, TG 89, HDL 42, LDL 73 ~  9/14: on Lip40 => notes from [facility] Humphrey ?Lip80 +[facility] study drug?; she has gained 4# to 190# today; [facility] & she reports concern for "particle size" & last avail FLP was 7/14 by [name] w/ TChol 190, HDL 100, LDL 40   FOLLOWED by [name] FOR ENDOCRINOLOGY >>   DIABETES MELLITUS  - on Metformin 500mgB68m PARLODEL 2.5mg/d 62m DrEllison... She is intol to Actos w/ edema, she stopped Januvia due to ?side effect, she wondered about Tajenta since her daugh works for [employer] 9/08 showed FBS=119, HgA1c=6.6 ~  [facility], HgA1c= 6.5 ~  labs 3/10 showed BS= 113, A1c= 6.6 ~  labs 6/10 by DrTurner showed BS= 92; and 3/11 BS= 127 ~  labs 4/11 showed BS= 124, A1c= 7.6... rec>Marland Kitchenincr Metform to Bid; she [facility] nutrition [facility] at [facility]... ~  6/11:  pt requested Endocrine consult & seen by DrEllison w/ Januvia 100mg/d 81md. ~  labs 9/11  showed BS= 98, A1c= 7.3.Marland KitchenMarland Kitchen she stopped Januvia due to side effects & wonders about using Tajenta instead. ~  DrEllison started [employer] 2.18m/d... She is now taking this +Metform 5057mid... ~  Labs 4/12 (wt=195#) showed Bs= 116, A1c= 7.1 ~  Labs 7/12 showed  A1c= 7.2;  Umicroalb= 3.5 ~  Labs 10/12 (wt=186#) showed A1c= 6.7 NOTE: She participated in a [facility] trial called the AfricanAmerican- Diabetes Heart Study in 2012; they sent a report indicating: 1) her memory was fine, but her MRI Brain showed sm vessel dis & mild sinus dis, otherw neg; 2) she may have treatable depression but we tried her on Zoloft & she was intol==> ch to Lexapro. ~  followed by [name] on Metform500Bid & Onglyza2.5; [place] showed BS=201, A1c=6.0; he rec same meds... ~  9/14: followed by [name] on Metform500Bid & Onglyza2.5 => ?he changed to Kombiglyze5-1000 daily?; last labs from DrHalaula/14 showed BS=105, A1c=6.5; and she remains on the same meds... ~  1/15: she had f/u [name] for endocrine> DM, Obesity, Adrenal adenoma, VitB12 defic; labs reviewed, note reviewed- he tried Belviq but she was intol...   FOLLOWED by [name] for GASTORENTEROLOGY >>   GERD SYMPTOMS - pt placed on PROTONIX 4040m w/ improvement in reflux symptoms...    ADDENDUM>> note from [name] indicates> upper endoscopy in July 1994 showed antral gastritis. 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  IRRITABLE BOWEL SYNDROME  - colonoscopy 7/03 by [name] was WNL. ~  9/14: she was referred to GI for f/u colon but never did it... ~  4/15: we will refer her to GI again...  RENAL CALCULUS  - sm stone seen in L kidney on CTScan... [facility]  FIBROMYALGIA  - prev Rx by [name] in 2006... ~  1/14: This is her CC w/ not resting well, wakes tired, poor energy, aching/ sore/ tender (esp in chest in AM) & we discussed trial Lunesta 2mg28m, & Tramadol 50mg25m plus rest, heat, etc... States she's INTOL Ambien w/ weird dreams & Benedryl/ Melatonin w/o help... ~  11/14: she went to the ER w/ LBP> felt to be a lumbar strain...  VITAMIN D DEFICIENCY - [facility]... supplemented w/ OTC Vit D 1000  u daily. ~  Vit D level was lower from [name] & he treated w/ Vit D 50K weekly for awhile, then switched to OTC supplement...  ANXIETY - on ALPRAZOLAM 0.5mg P85m.. DEPRESSION - prev on Lexapro 20mg/d48m she stopped on her own ~10/13 ?cost issues? ~  4/12: c/o feeling sad & anxious all the time- weak, not resting well, under a lot of stress; rec to seek counselling thru her local church or let us refeKoreato [facility] w/ Zoloft 50==> but she was INTOL... ~  2013: switched to Lexapro & seems to be doing better but didn't stick w/ it due to cost issues...  ANEMIA & LOW FERRITIN LEVEL >>  B12 DEFICIENCY >> treated by [name] w/ B12 shots ~  9/14: she tells me that Ferritin is low & [name] tried her on Oral Fe prep w/o improvement & wants me to f/u and treat this problem; we discussed need to recheck labs here- LABS 9/14 showed Hg=12.5, MCV=88, Fe=47 (12%sat), Ferritin=10.8; she is due for f/u colon w/ [name] & we will refer, rec to start FeSO4 325mg Bi76m VitC500 w/ plans for f/u CBC, Fe studies in 2-71mo; ~21mo  4/15:  Labs showed Hg= 12.5 but Fe=31 (8%); she was only taking the Fe once daily 7 advised to incr to Bid w/ vitC500; needs f/u colon & we will refer to [name] again...  HEALTH MAINTENANCE: ~  GI:  Followed by [name] & colon due 7/13... ~  GYN:  Followed by DrDillard & she gets yearly PAP, Mammogram, hasn't had baseline BMD yet, started on [facility] 5/13... ~  Immunizations:  She gets the yearly Flu shots at school;  Had PNEUMOVAX previously; TDAP given 4/12...   No past surgical history on file - other than Lithotripsy for renal stone...   Outpatient Encounter Prescriptions as of 09/09/2014  Medication Sig  . ALPRAZolam (XANAX) 0.5 MG tablet take 1/2 tablet by mouth MID MORNING AND MID AFTERNOON, and 1 tablet by mouth at bedtime  . aspirin 81 MG tablet Take 81 mg by mouth daily.    [name] atorvastatin (LIPITOR) 20 MG tablet Take 1 tablet (20 mg total) by mouth daily.  [name]  azithromycin (ZITHROMAX) 250 MG tablet Take as directed  . BELVIQ 10 MG TABS   . Cyanocobalamin 1000 MCG/ML KIT Inject 1,000 mg as directed every 30 (thirty) days.  [name] diltiazem (CARDIZEM CD) 240 MG 24 hr capsule take 1 capsule by mouth once daily  . ferrous sulfate 325 (65 FE) MG tablet Take 325 mg by mouth 2 (two) times daily with a meal.   . furosemide (LASIX) 20 MG tablet Take 2 tablets by mouth every morning  . losartan (COZAAR) 50 MG tablet Take 2 tablets (100 mg total) by mouth daily.  . meclizine (ANTIVERT) 25 MG tablet Take 25 mg by mouth. 1/2 to 1 tab by mouth every 4 hours as needed for dizziness   . metoprolol tartrate (LOPRESSOR) 25 MG tablet Take 1 tablet (25 mg total) by mouth 2 (two) times daily.  . nitroGLYCERIN (NITROSTAT) 0.4 MG SL tablet Place 1 tablet (0.4 mg total) under the tongue every 5 (five) minutes as needed for chest pain.  [name] ondansetron (ZOFRAN) 4 MG tablet Take 1 tablet (4 mg total) by mouth every 8 (eight) hours as needed for nausea or vomiting.  . pantoprazole (PROTONIX) 40 MG tablet Take 1 tablet (40 mg total) by mouth daily.  . Saxagliptin-Metformin (KOMBIGLYZE XR) 03-999 MG TB24 Take 1 tablet by mouth once.  . sodium chloride (OCEAN NASAL SPRAY) 0.65 % nasal spray 1 spray by Nasal route as needed.    . valACYclovir (VALTREX) 500 MG tablet Take 500 mg by mouth daily.   . vitamin C (ASCORBIC ACID) 500 MG tablet Take 500 mg by mouth 2 (two) times daily. With the iron tablets    Allergies  Allergen Reactions  . Actos [Pioglitazone]     REACTION: pt states INTOL w/ edema  . Codeine     REACTION: vomiting  . Januvia [Sitagliptin] Nausea Only  . Lactose Intolerance (Gi)   . Morphine Nausea Only    REACTION: vomiting  . Sitagliptin Phosphate     REACTION: nausea    Current Medications, Allergies, Past Medical History, Past Surgical History, Family History, and Social History were reviewed in [employer] record.   Review of  Systems        See HPI - all other systems neg except as noted...      The patient complains of weight gain, dyspnea on exertion, and peripheral edema.  The patient denies anorexia, fever, weight loss, vision loss, decreased hearing, hoarseness, chest pain, syncope, prolonged cough,  headaches, hemoptysis, abdominal pain, melena, hematochezia, severe indigestion/heartburn, hematuria, incontinence, genital sores, suspicious skin lesions, transient blindness, difficulty walking, depression, unusual weight change, abnormal bleeding, enlarged lymph nodes, and angioedema.     Objective:   Physical Exam     WD, Overweight, 57 y/o BF in NAD... GENERAL:  Alert & oriented; pleasant & cooperative. HEENT:  [place]/AT, EOM-wnl, PERRLA, EACs-clear, TMs-wnl, NOSE-clear, THROAT-clear & wnl. NECK:  Supple w/ fairROM; no JVD; normal carotid impulses w/o bruits; no thyromegaly or nodules palpated; no lymphadenopathy. CHEST:  Clear to P & A; without wheezes or rales, +scat rhonchi; sl tender on palpation of chest wall. HEART:  Regular Rhythm; without murmurs/ rubs/ or gallops. ABDOMEN:  Soft & nontender; normal bowel sounds; no organomegaly or masses detected. EXT: without deformities, mild arthritic changes; no varicose veins but +venous insuffic & tr edema; +trigger points. NEURO:  CN's intact;  no focal neuro deficits... DERM:  No lesions noted; no rash etc...  RADIOLOGY DATA:  Reviewed in the EPIC EMR & discussed w/ the patient...  LABORATORY DATA:  Reviewed in the EPIC EMR & discussed w/ the patient...   Assessment & Plan:    RESP>  Hx AR, HxAsthma, old sarcoid, mild OSA>  All stable, breathing OK, but she notes some wheezing w/ activity; exam reveals sonme secretions in the airway & rec to take MUCINEX 1231m bid, Fluids, etc;. CXR remains stable/ NAD...   CARDS>  Followed by DrTTurner/ VRica Mote?on meds listed? SHE DID NOT BRING MEDS TO THE OV> Hx HBP, CAD, VI, etc;  Chest pain is non-cardiac &  likely related to her FM; Doing satis & we reviewed exercise program etc...  [facility]>  [facility] + off Zetia; ? Taking a study drug from [facility]..  DM>  Followed by [name] on Metformin, Onglyzal; A1c improved to 6.7 in 2012, then 6.0 in 2013, & 6.5  In 2014;  rec continue meds & [place]  GI>  Stable on Protonix as needed; see GI eval from [name] 7/15...  FM>  She has long standing FM which appears to have flaired up recently- not resting, no energy, aches & pains, etc; we discussed getting proper sleep- [place] pain...  Anxiety>  She remains on Alprazolam as needed; & she stopped the Lexapro..  Anemia, Low Ferritin> on FeSO4 3244m& VitC 500...    Patient's Medications  New Prescriptions   AZITHROMYCIN (ZITHROMAX) 250 MG TABLET    Take as directed  Previous Medications   ALPRAZOLAM (XANAX) 0.5 MG TABLET    take 1/2 tablet by mouth MID MORNING AND MID AFTERNOON, and 1 tablet by mouth at bedtime   ASPIRIN 81 MG TABLET    Take 81 mg by mouth daily.     BELVIQ 10 MG TABS       CYANOCOBALAMIN 1000 MCG/ML KIT    Inject 1,000 mg as directed every 30 (thirty) days.   DILTIAZEM (CARDIZEM CD) 240 MG 24 HR CAPSULE    take 1 capsule by mouth once daily   FERROUS SULFATE 325 (65 FE) MG TABLET    Take 325 mg by mouth daily with breakfast.    LOSARTAN (COZAAR) 50 MG TABLET    Take 2 tablets (100 mg total) by mouth daily.   MECLIZINE (ANTIVERT) 25 MG TABLET    Take 25 mg by mouth. 1/2 to 1 tab by mouth every 4 hours as needed  for dizziness    METOPROLOL TARTRATE (LOPRESSOR) 25 MG TABLET    Take 1 tablet (25 mg total) by mouth 2 (two) times daily.   NITROGLYCERIN (NITROSTAT) 0.4 MG SL TABLET    Place 1 tablet (0.4 mg total) under the tongue every 5 (five) minutes as needed for chest pain.   ONDANSETRON (ZOFRAN) 4 MG TABLET    Take 1 tablet (4 mg total) by mouth every 8 (eight) hours as needed for nausea or  vomiting.   PANTOPRAZOLE (PROTONIX) 40 MG TABLET    Take 1 tablet (40 mg total) by mouth daily.   SAXAGLIPTIN-METFORMIN (KOMBIGLYZE XR) 03-999 MG TB24    Take 1 tablet by mouth once.   SODIUM CHLORIDE (OCEAN NASAL SPRAY) 0.65 % NASAL SPRAY    1 spray by Nasal route as needed.     VALACYCLOVIR (VALTREX) 500 MG TABLET    Take 500 mg by mouth daily.    VITAMIN C (ASCORBIC ACID) 500 MG TABLET    Take 500 mg by mouth daily. With the iron tablets  Modified Medications   Modified Medication Previous Medication   FUROSEMIDE (LASIX) 20 MG TABLET furosemide (LASIX) 20 MG tablet      Take 2 tablets by mouth every morning as needed    Take 2 tablets by mouth every morning  Discontinued Medications   ATORVASTATIN (LIPITOR) 20 MG TABLET    Take 1 tablet (20 mg total) by mouth daily.   AZITHROMYCIN (ZITHROMAX) 250 MG TABLET    Take as directed

## 2014-09-10 NOTE — Telephone Encounter (Signed)
Patient requesting refill on Xanax.  Last refill: 03/18/14; LOV 09/09/14; No OV scheduled.

## 2014-09-16 ENCOUNTER — Encounter: Payer: Self-pay | Admitting: Pulmonary Disease

## 2014-09-19 ENCOUNTER — Other Ambulatory Visit: Payer: Self-pay | Admitting: Pulmonary Disease

## 2014-09-19 MED ORDER — ALPRAZOLAM 0.5 MG PO TABS: ORAL_TABLET | ORAL | Status: DC

## 2014-09-30 ENCOUNTER — Ambulatory Visit: Payer: BC Managed Care – PPO | Admitting: *Deleted

## 2014-10-22 ENCOUNTER — Telehealth: Payer: Self-pay | Admitting: Pharmacist

## 2014-10-22 DIAGNOSIS — IMO0001 Reserved for inherently not codable concepts without codable children: Secondary | ICD-10-CM

## 2014-10-22 MED ORDER — PITAVASTATIN CALCIUM 2 MG PO TABS: 2.0000 mg | ORAL_TABLET | Freq: Every day | ORAL | Status: DC

## 2014-10-22 NOTE — Telephone Encounter (Signed)
Called pt.  We received correspondence from Dr. Jodi Mourning office regarding her elevated CK levels.  Not stated that he did not think it was polymyositis given elevated CK level with normal AST and ALT.  He believes that it is an isolated increased CK and okay to restart statin medication.  Discussed his recommendations with patient.  She is willing to retry statin.  Will try Livalo 2mg  daily given it has less hepatic metabolism.  Will recheck CK in 1 month to ensure no extreme elevation and then lipid panel in 3 months.

## 2014-11-05 ENCOUNTER — Ambulatory Visit: Payer: BC Managed Care – PPO | Admitting: *Deleted

## 2014-11-05 ENCOUNTER — Telehealth: Payer: Self-pay | Admitting: Pulmonary Disease

## 2014-11-05 MED ORDER — AZITHROMYCIN 250 MG PO TABS
ORAL_TABLET | ORAL | Status: DC
Start: 2014-11-05 — End: 2015-01-29

## 2014-11-05 MED ORDER — PREDNISONE 5 MG PO KIT: PACK | ORAL | Status: DC

## 2014-11-05 NOTE — Telephone Encounter (Signed)
Pt aware of recs.  meds sent in.  Nothing further needed.  

## 2014-11-05 NOTE — Telephone Encounter (Signed)
Called and spoke to pt. Pt c/o eye drainage, head and chest congestion, ear pain, sore throat, prod cough with green mucus, mild chest tightness and chills and sweats, unknown if febrile-pt doesn't have thermometer x 7 days. Pt last seen on 09/09/14.   Dr. [name] please advise.  Allergies  Allergen Reactions  . Actos [Pioglitazone]     REACTION: pt states INTOL w/ edema  . Codeine     REACTION: vomiting  . Januvia [Sitagliptin] Nausea Only  . Lactose Intolerance (Gi)   . Morphine Nausea Only    REACTION: vomiting  . Sitagliptin Phosphate     REACTION: nausea    Current Outpatient Prescriptions on File Prior to Visit  Medication Sig Dispense Refill  . ALPRAZolam (XANAX) 0.5 MG tablet take 1/2 tablet by mouth MID MORNING AND MID AFTERNOON, and 1 tablet by mouth at bedtime 60 tablet 5  . aspirin 81 MG tablet Take 81 mg by mouth daily.      [name] azithromycin (ZITHROMAX) 250 MG tablet Take as directed 6 tablet 0  . BELVIQ 10 MG TABS     . Cyanocobalamin 1000 MCG/ML KIT Inject 1,000 mg as directed every 30 (thirty) days.    [name] diltiazem (CARDIZEM CD) 240 MG 24 hr capsule take 1 capsule by mouth once daily 30 capsule 4  . ferrous sulfate 325 (65 FE) MG tablet Take 325 mg by mouth daily with breakfast.     . furosemide (LASIX) 20 MG tablet Take 2 tablets by mouth every morning as needed    . losartan (COZAAR) 50 MG tablet Take 2 tablets (100 mg total) by mouth daily. 30 tablet 4  . meclizine (ANTIVERT) 25 MG tablet Take 25 mg by mouth. 1/2 to 1 tab by mouth every 4 hours as needed for dizziness     . metoprolol tartrate (LOPRESSOR) 25 MG tablet Take 1 tablet (25 mg total) by mouth 2 (two) times daily. 60 tablet 6  . nitroGLYCERIN (NITROSTAT) 0.4 MG SL tablet Place 1 tablet (0.4 mg total) under the tongue every 5 (five) minutes as needed for chest pain. 25 tablet 5  . ondansetron (ZOFRAN) 4 MG tablet Take 1 tablet (4 mg total) by mouth every 8 (eight) hours as needed for nausea or vomiting. 20  tablet 1  . pantoprazole (PROTONIX) 40 MG tablet Take 1 tablet (40 mg total) by mouth daily. 180 tablet 1  . Pitavastatin Calcium (LIVALO) 2 MG TABS Take 1 tablet (2 mg total) by mouth daily. 28 tablet 0  . Saxagliptin-Metformin (KOMBIGLYZE XR) 03-999 MG TB24 Take 1 tablet by mouth once.    . sodium chloride (OCEAN NASAL SPRAY) 0.65 % nasal spray 1 spray by Nasal route as needed.      . valACYclovir (VALTREX) 500 MG tablet Take 500 mg by mouth daily.     . vitamin C (ASCORBIC ACID) 500 MG tablet Take 500 mg by mouth daily. With the iron tablets     No current facility-administered medications on file prior to visit.

## 2014-11-05 NOTE — Telephone Encounter (Signed)
Per SN---  zpak #1  Take as directed pred dosepak  #1  TAD( if she wants this) otc mucinex 600 mg  1 po QID Increase water intake Warm compresses for her eyes Tylenol Throat lozenges

## 2014-11-27 ENCOUNTER — Other Ambulatory Visit (INDEPENDENT_AMBULATORY_CARE_PROVIDER_SITE_OTHER): Payer: BC Managed Care – PPO | Admitting: *Deleted

## 2014-11-27 DIAGNOSIS — M791 Myalgia: Secondary | ICD-10-CM

## 2014-11-27 DIAGNOSIS — M609 Myositis, unspecified: Secondary | ICD-10-CM

## 2014-11-27 DIAGNOSIS — IMO0001 Reserved for inherently not codable concepts without codable children: Secondary | ICD-10-CM

## 2014-11-28 ENCOUNTER — Other Ambulatory Visit: Payer: Self-pay | Admitting: *Deleted

## 2014-11-28 LAB — CK: Total CK: 748 U/L — ABNORMAL HIGH (ref 7–177)

## 2014-11-28 MED ORDER — DILTIAZEM HCL ER COATED BEADS 240 MG PO CP24: ORAL_CAPSULE | ORAL | Status: DC

## 2014-11-29 ENCOUNTER — Telehealth: Payer: Self-pay | Admitting: Interventional Cardiology

## 2014-11-29 NOTE — Telephone Encounter (Signed)
New message     Returning Sally's call regarding her labs

## 2014-12-02 MED ORDER — PITAVASTATIN CALCIUM 2 MG PO TABS: 2.0000 mg | ORAL_TABLET | Freq: Every day | ORAL | Status: DC

## 2014-12-02 NOTE — Telephone Encounter (Signed)
Spoke with pt.  Her CK is stable from last check while on Livalo.  She does not report any problems other than it tasting funny.  Will continue current dose.  She has follow up with Dr. Irish Lack in March and will repeat labs at that time.

## 2014-12-03 ENCOUNTER — Encounter: Payer: Self-pay | Admitting: Dietician

## 2014-12-03 ENCOUNTER — Encounter: Payer: BC Managed Care – PPO | Attending: Interventional Cardiology | Admitting: Dietician

## 2014-12-03 VITALS — Ht 62.0 in | Wt 200.0 lb

## 2014-12-03 DIAGNOSIS — Z713 Dietary counseling and surveillance: Secondary | ICD-10-CM | POA: Diagnosis not present

## 2014-12-03 DIAGNOSIS — E119 Type 2 diabetes mellitus without complications: Secondary | ICD-10-CM | POA: Insufficient documentation

## 2014-12-03 NOTE — Progress Notes (Signed)
  Medical Nutrition Therapy:  Appt start time: 4540 end time:  1700.   Assessment:  Primary concerns today: Patient wants to learn how to eat healthy and lose weight.  Hx of type 2 diabetes about 7 years ago.  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  There is often donuts at [employer].   Eats out with friends but they are not very supportive. Tried Weight Watchers in May of 2015 but did not do well because she "didn't know how to cook".   Has considered rejoining a weight loss group for support of others.  UBW 190's for several years.  Goes to the [facility] and walks about 3 times a week for about 30 minutes each.  Patient lives alone.  Does not keep much food at home.  Eats out 1-2 times per day.  Preferred Learning Style:   Hands on  Learning Readiness:   Ready  MEDICATIONS: see list   DIETARY INTAKE:  Patient is lactose intolerant.  Eats out 1-2 times daily.  24-hr recall:  B ( AM):  1 container instant oatmeal, hot tea with splenda and lemon or panera bagel and a cup of fruit Snk ( AM): none  L ( PM): sandwich from home or fast food with coworkers ([employer]ohn[employer], [place], mcdonalds, bojangles, or chik filet) Snk ( PM): none D ( PM): go out or pick up to go.  (cracker barrel, PJ changs, [employer], arby's, subway, [place] mikes) Snk ( PM): none Beverages: hot tea with splenda, sweet tea, regular pepsi, hot chocolate, water, milkshake rarely with lactaid  Usual physical activity: goes to the [facility] 3 times a week for 30 minutes each.  Estimated energy needs: 1200 calories 135 g carbohydrates 90 g protein 33 g fat  Progress Towards Goal(s):  In progress.   Nutritional Diagnosis:  NB-1.1 Food and nutrition-related knowledge deficit As related to balance of carbohydrates, protein, and fat.  As evidenced by diet hx.    Intervention:  Nutrition counseling and diabetes education initiated. Discussed Carb Counting by food group as method of portion control, reading  food labels, and benefits of increased activity. . Continue the exercise. Consider increasing to 30 minutes 5 x per week.  Rethink your drink.   Be mindful of portion sizes Ask:  what is in my food? Or How is it prepared? Aim for 2 Carb choices per meal (30 grams carb) 0-2 carb for a snack with protein  Teaching Method Utilized:  Visual Auditory Hands on  Handouts given during visit include: Food Label handouts Meal Plan Card My Plate handout Low Carb/protein snack list   Barriers to learning/adherence to lifestyle change: increased eating out secondary to inability to cook, lack of support of peers.  Demonstrated degree of understanding via:  Teach Back   Monitoring/Evaluation:  Dietary intake, exercise, label reading, and body weight in 6 week(s).

## 2014-12-03 NOTE — Patient Instructions (Signed)
Continue the exercise. Consider increasing to 30 minutes 5 x per week.  Rethink your drink.   Be mindful of portion sizes Ask:  what is in my food? Or How is it prepared? Aim for 2 Carb choices per meal (30 grams carb) 0-2 carb for a snack with protein

## 2014-12-05 ENCOUNTER — Telehealth: Payer: Self-pay | Admitting: *Deleted

## 2014-12-05 DIAGNOSIS — E782 Mixed hyperlipidemia: Secondary | ICD-10-CM

## 2014-12-05 NOTE — Telephone Encounter (Signed)
Called pt and spoke with them about lab results and orders, CK stable with addition of Livalo. Pt not having any symptoms of muscle aches. Plan to continue Livalo 2mg  daily and recheck labs in March with Dr. Hassell Done appointment.  Lab appt made for 01/27/15. Pt verbalized understanding and was in agreement with this plan.

## 2014-12-05 NOTE — Telephone Encounter (Signed)
-----   Message from Jettie Booze, MD sent at 12/05/2014 11:18 AM EST ----- Please set up labs per [facility] note.

## 2014-12-29 ENCOUNTER — Other Ambulatory Visit: Payer: Self-pay | Admitting: Pulmonary Disease

## 2015-01-06 ENCOUNTER — Other Ambulatory Visit: Payer: Self-pay

## 2015-01-06 MED ORDER — LOSARTAN POTASSIUM 50 MG PO TABS: 100.0000 mg | ORAL_TABLET | Freq: Every day | ORAL | Status: DC

## 2015-01-08 ENCOUNTER — Other Ambulatory Visit: Payer: Self-pay | Admitting: *Deleted

## 2015-01-08 MED ORDER — METOPROLOL TARTRATE 25 MG PO TABS: 25.0000 mg | ORAL_TABLET | Freq: Two times a day (BID) | ORAL | Status: DC

## 2015-01-13 ENCOUNTER — Encounter: Payer: BC Managed Care – PPO | Attending: Interventional Cardiology | Admitting: Dietician

## 2015-01-13 VITALS — Ht 62.0 in | Wt 201.0 lb

## 2015-01-13 DIAGNOSIS — E669 Obesity, unspecified: Secondary | ICD-10-CM

## 2015-01-13 DIAGNOSIS — E119 Type 2 diabetes mellitus without complications: Secondary | ICD-10-CM | POA: Diagnosis present

## 2015-01-13 DIAGNOSIS — Z713 Dietary counseling and surveillance: Secondary | ICD-10-CM | POA: Diagnosis not present

## 2015-01-13 NOTE — Patient Instructions (Signed)
Continue to drink more water.  Avoid sugar sweetened beverages. Consider increasing exercising to 30 minutes 5 days per week. Be mindful of portion sizes Ask:  what is in my food? Or How is it prepared? Aim for 2 Carb choices per meal (30 grams carb) 0-2 carb for a snack with protein Consider looking up the food facts on foods eaten out (CalorieKing.com or the restaurant's web site.   Breakfast ideas: English muffin with egg and cheese Oatmeal with almonds or walnuts Protein bar and fruit Toast with peanut butter and fruit   Lunch ideas: Sub on flat bread loaded with veges

## 2015-01-13 NOTE — Progress Notes (Signed)
Medical Nutrition Therapy:  Appt start time: 3825 end time:  1700.   Assessment:  12/03/14:  Primary concerns today: Patient wants to learn how to eat healthy and lose weight.  Hx of type 2 diabetes about 7 years ago.  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  There is often donuts at [employer].   Eats out with friends but they are not very supportive. Tried Weight Watchers in May of 2015 but did not do well because she "didn't know how to cook".   Has considered rejoining a weight loss group for support of others.  UBW 190's for several years.  Goes to the [facility] and walks about 3 times a week for about 30 minutes each.  Patient lives alone.  Does not keep much food at home.  Eats out 1-2 times per day.  01/13/15: Patient has not been exercising due to the weather and missing membership at [facility].  She has started to make changes in her diet and increase the amounts of vegetables eaten as well as trying to decrease her carbohydrate intake.  Weight is unchanged.  Patient states that she has considered gastric surgery but her daughter is against this.  TANITA  BODY COMP RESULTS     BMI (kg/m^2) 36.9   Fat Mass (lbs) 83   Fat Free Mass (lbs) 119   Total Body Water (lbs) 87   Preferred Learning Style:   Hands on Learning Readiness:   Ready  MEDICATIONS: see list  DIETARY INTAKE: Patient is lactose intolerant.  Eats out 1-2 times daily.  24-hr recall:  B ( AM):  1 container of oatmeal or protein bar Snk ( AM): none  L ( PM): takes something from home or out to eat Snk ( PM): none D ( PM): go out or pick up to go.  (cracker barrel, PJ changs, [employer], arby's, subway, [place] mikes) Snk ( PM): none Beverages: hot tea with splenda, decreased sweet tea, decreased regular pepsi, no hot chocolate, water Usual physical activity: goes to the [facility] 3 times a week for 30 minutes each.  Estimated energy needs: 1200 calories 135 g carbohydrates 90 g protein 33 g  fat  Progress Towards Goal(s):  In progress.   Nutritional Diagnosis:  NB-1.1 Food and nutrition-related knowledge deficit As related to balance of carbohydrates, protein, and fat.  As evidenced by diet hx.    Intervention:  Nutrition counseling.  Reviewed Carbohydrates and portion size as patient did not understand this.  Discussed eating out and began basics of meal planning.  Continue to drink more water.  Avoid sugar sweetened beverages. Consider increasing exercising to 30 minutes 5 days per week. Be mindful of portion sizes Ask:  what is in my food? Or How is it prepared? Aim for 2 Carb choices per meal (30 grams carb) 0-2 carb for a snack with protein Consider looking up the food facts on foods eaten out (CalorieKing.com or the restaurant's web site.)  Breakfast ideas: English muffin with egg and cheese Oatmeal with almonds or walnuts Protein bar and fruit Toast with peanut butter and fruit  Lunch ideas: Sub on flat bread loaded with veges  Teaching Method Utilized:  Visual Auditory  Handouts given during visit include: Food Label handouts Meal Plan Card My Plate handout  Barriers to learning/adherence to lifestyle change: increased eating out secondary to inability to cook, lack of support of peers.  Demonstrated degree of understanding via:  Teach Back  Monitoring/Evaluation:  Dietary intake, exercise, label reading, and body weight in 6 week(s).

## 2015-01-27 ENCOUNTER — Other Ambulatory Visit (INDEPENDENT_AMBULATORY_CARE_PROVIDER_SITE_OTHER): Payer: BC Managed Care – PPO | Admitting: *Deleted

## 2015-01-27 DIAGNOSIS — E782 Mixed hyperlipidemia: Secondary | ICD-10-CM

## 2015-01-27 LAB — HEPATIC FUNCTION PANEL
ALT: 36 U/L — ABNORMAL HIGH (ref 0–35)
AST: 43 U/L — ABNORMAL HIGH (ref 0–37)
Albumin: 3.9 g/dL (ref 3.5–5.2)
Alkaline Phosphatase: 77 U/L (ref 39–117)
BILIRUBIN DIRECT: 0.1 mg/dL (ref 0.0–0.3)
BILIRUBIN TOTAL: 0.4 mg/dL (ref 0.2–1.2)
Total Protein: 6.6 g/dL (ref 6.0–8.3)

## 2015-01-27 LAB — LIPID PANEL
CHOL/HDL RATIO: 4
CHOLESTEROL: 181 mg/dL (ref 0–200)
HDL: 44.4 mg/dL (ref >39.00)
LDL Cholesterol: 104 mg/dL — ABNORMAL HIGH (ref 0–99)
NonHDL: 136.6
Triglycerides: 162 mg/dL — ABNORMAL HIGH (ref 0.0–149.0)
VLDL: 32.4 mg/dL (ref 0.0–40.0)

## 2015-01-28 ENCOUNTER — Other Ambulatory Visit: Payer: Self-pay | Admitting: Pharmacist

## 2015-01-28 NOTE — Addendum Note (Signed)
Addended by: Elberta Leatherwood R on: 01/28/2015 03:01 PM   Modules accepted: Orders

## 2015-01-29 ENCOUNTER — Ambulatory Visit (INDEPENDENT_AMBULATORY_CARE_PROVIDER_SITE_OTHER): Payer: BC Managed Care – PPO | Admitting: Interventional Cardiology

## 2015-01-29 ENCOUNTER — Encounter: Payer: Self-pay | Admitting: Interventional Cardiology

## 2015-01-29 VITALS — BP 124/70 | HR 80 | Ht 62.0 in | Wt 204.0 lb

## 2015-01-29 DIAGNOSIS — E669 Obesity, unspecified: Secondary | ICD-10-CM

## 2015-01-29 DIAGNOSIS — E782 Mixed hyperlipidemia: Secondary | ICD-10-CM

## 2015-01-29 DIAGNOSIS — I2510 Atherosclerotic heart disease of native coronary artery without angina pectoris: Secondary | ICD-10-CM

## 2015-01-29 LAB — CK TOTAL AND CKMB (NOT AT ARMC): Total CK: 1329 U/L — ABNORMAL HIGH (ref 7–177)

## 2015-01-29 NOTE — Progress Notes (Signed)
Patient ID: Jocelyn [name], female   DOB: 1956-07-26, 59 y.o.   MRN: [id number]    [address] [place], [place]  [address] Phone: [phone] Fax:  [phone]  Date:  01/29/2015   ID:  Jocelyn [name], DOB 15-Dec-1955, MRN [id number]  PCP:  Noralee Space, MD      History of Present Illness: Jocelyn [name] is a 59 y.o. female who has had mild CAD in a branch vessel. She has not used NTG recently. Exercise limited by knee problem.   She gets [facility] with exertional but no CP.  Has an occasional tightness in her chest wall and her thigh muscles. She does use an exercise bike at the [facility].  She can only get there 2 days a week.  She can do it more frequently in the summer.   BP outside of the MDs office are close to 120/80. CAD/ASCVD:  Denies : Diaphoresis.  Dizziness.  Leg edema.  Palpitations.  Syncope.   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     Wt Readings from Last 3 Encounters:  01/29/15 204 lb (92.534 kg)  01/13/15 201 lb (91.173 kg)  12/03/14 200 lb (90.719 kg)     Past Medical History  Diagnosis Date  . Meniere's disease, unspecified   . Allergic rhinitis, cause unspecified   . Acute bronchitis   . Sarcoidosis   . Obstructive sleep apnea (adult) (pediatric)   . Essential hypertension, benign   . Coronary atherosclerosis of unspecified type of vessel, native or graft   . Unspecified venous (peripheral) insufficiency   . Pure hypercholesterolemia   . Type II or unspecified type diabetes mellitus without mention of complication, not stated as uncontrolled   . Irritable bowel syndrome   . Calculus of kidney   . Fibromyalgia   . Vitamin D deficiency   . Dizziness   . Anxiety   . Myalgia and myositis, unspecified   . Calculus of kidney   . BV (bacterial vaginosis) 06/22/1996  . H/O varicella   . Headache(784.0)     frequently  . Yeast infection   . Menses, irregular 2003  . Perimenopausal symptoms 2003  . Fibroid 2003  . H/O  dysmenorrhea 2008  . HSV-2 infection 2009  . Vulvitis 2010  . B12 deficiency   . Anemia     Current Outpatient Prescriptions  Medication Sig Dispense Refill  . ALPRAZolam (XANAX) 0.5 MG tablet take 1/2 tablet by mouth MID MORNING AND MID AFTERNOON, and 1 tablet by mouth at bedtime 60 tablet 5  . aspirin 81 MG tablet Take 81 mg by mouth daily.      . Cyanocobalamin 1000 MCG/ML KIT Inject 1,000 mg as directed every 30 (thirty) days.    [name] diltiazem (CARDIZEM CD) 240 MG 24 hr capsule take 1 capsule by mouth once daily 30 capsule 3  . ferrous sulfate 325 (65 FE) MG tablet Take 325 mg by mouth daily with breakfast.     . furosemide (LASIX) 20 MG tablet Take 2 tablets by mouth every morning as needed    . IRON-VITAMIN C PO Take by mouth daily.    [name] losartan (COZAAR) 50 MG tablet Take 2 tablets (100 mg total) by mouth daily. 60 tablet 4  . metoprolol tartrate (LOPRESSOR) 25 MG tablet Take 1 tablet (25 mg total) by mouth 2 (two) times daily. 60 tablet 3  . nitroGLYCERIN (NITROSTAT) 0.4 MG SL tablet Place 1 tablet (0.4  mg total) under the tongue every 5 (five) minutes as needed for chest pain. 25 tablet 5  . ondansetron (ZOFRAN) 4 MG tablet Take 1 tablet (4 mg total) by mouth every 8 (eight) hours as needed for nausea or vomiting. 20 tablet 1  . pantoprazole (PROTONIX) 40 MG tablet Take 1 tablet (40 mg total) by mouth daily. 180 tablet 1  . Saxagliptin-Metformin (KOMBIGLYZE XR) 03-999 MG TB24 Take 1 tablet by mouth once.    . valACYclovir (VALTREX) 500 MG tablet Take 500 mg by mouth daily.      No current facility-administered medications for this visit.    Allergies:    Allergies  Allergen Reactions  . Actos [Pioglitazone]     REACTION: pt states INTOL w/ edema  . Codeine     REACTION: vomiting  . Januvia [Sitagliptin] Nausea Only  . Lactose Intolerance (Gi)   . Morphine Nausea Only    REACTION: vomiting  . Sitagliptin Phosphate     REACTION: nausea    Social History:  The patient   reports that she has never smoked. She has never used smokeless tobacco. She reports that she does not drink alcohol or use illicit drugs.   Family History:  The patient's family history is negative for Colon cancer, Esophageal cancer, Rectal cancer, and Stomach cancer. She was adopted.   ROS:  Please see the history of present illness.  No nausea, vomiting.  No fevers, chills.  No focal weakness.  No dysuria. She reports some mild LE edema.    All other systems reviewed and negative.   PHYSICAL EXAM: VS:  BP 124/70 mmHg  Pulse 80  Ht 5' 2" (1.575 m)  Wt 204 lb (92.534 kg)  BMI 37.30 kg/m2 Well nourished, well developed, in no acute distress HEENT: normal Neck: no JVD, no carotid bruits Cardiac:  normal S1, S2; RRR;  Lungs:  clear to auscultation bilaterally, no wheezing, rhonchi or rales Abd: soft, nontender, no hepatomegaly Ext: no edema Skin: warm and dry Neuro:   no focal abnormalities noted Psych: normal affect      ASSESSMENT AND PLAN:  Coronary atherosclerosis of native coronary artery  Continue Lopressor Tablet, 25 MG, 1 tablet, Orally, 1 tablet in am and 1 tablet in PM Notes: Branch vessel disease. She is off of Ranexa. No angina. Restart Imdur if CP returns. Hold off Imdur at this time. She had headaches in the past with this medicine.  CP now is atypical , not related to exertion,and does not sound cardiac.    2. Benign hypertensive heart disease without heart failure  Continue Cardizem CD Capsule Extended Release 24 Hour, 240 MG, 1 capsule, Orally, once a day Off Amlodipine Besylate Tablet, 5 MG, 1 tablet, Orally, Once a day Notes: Controlled after increasing losartan to 100 mg daily.   Amlodipine was stopped due to swelling, but the swelling has persisted even off of amlodipine.    3. Hypercholesteremia, pure  stopped Lipitor Tablet, 80 MG, 1 tablet, Orally, once a day stopped Co Q-10 Capsule, 200 MG, 1 capsule with a meal, Orally, Once a day Notes: Stopping statins  for now due to elevated CK. Patient discussed this with our Pharm.D. CK now over thousand. She has had her rheumatology evaluation..  CK to be checked in a month.   4. obesity: No significant weight loss despite using Belviq.  Continue to follow with the nutritionist. Continue to try to increase exercise tolerance.  5. Diabetes- managed by PMD. Preventive Medicine  Adult   topics discussed:  Diet: healthy diet.  Exercise: at least 30 minutes of aerobic exercise, 5 days a week.      Signed, Jay S. Varanasi, MD, FACC 01/29/2015 4:48 PM   

## 2015-01-29 NOTE — Patient Instructions (Signed)
Your physician recommends that you continue on your current medications as directed. Please refer to the Current Medication list given to you today.  Your physician recommends that you return for lab work on 02-24-15 at 4:45 pm for repeat creatinine kinase.  Your physician recommends that you schedule a follow-up appointment as needed.

## 2015-02-24 ENCOUNTER — Other Ambulatory Visit (INDEPENDENT_AMBULATORY_CARE_PROVIDER_SITE_OTHER): Payer: BC Managed Care – PPO | Admitting: *Deleted

## 2015-02-24 ENCOUNTER — Ambulatory Visit: Payer: BC Managed Care – PPO | Admitting: Dietician

## 2015-02-24 DIAGNOSIS — E782 Mixed hyperlipidemia: Secondary | ICD-10-CM

## 2015-02-25 LAB — CK: Total CK: 1413 U/L — ABNORMAL HIGH (ref 7–177)

## 2015-02-26 ENCOUNTER — Telehealth: Payer: Self-pay | Admitting: Interventional Cardiology

## 2015-02-26 NOTE — Telephone Encounter (Signed)
New message      Pt request to talk to Gay Filler about her most recent lab results (CPK).  She said it is going up and Gay Filler has been helping her with this

## 2015-02-28 NOTE — Telephone Encounter (Signed)
Spoke with pt.  Her CK continues to be elevated despite stopping Livalo last month.  She states she is having the same normal aches and pains that she normally has- nothing different.  Told pt I would discuss with Dr. Irish Lack next week to determine the next steps.  She is agreeable to this.

## 2015-03-03 ENCOUNTER — Telehealth: Payer: Self-pay | Admitting: Internal Medicine

## 2015-03-03 ENCOUNTER — Encounter: Payer: Self-pay | Admitting: *Deleted

## 2015-03-03 NOTE — Telephone Encounter (Signed)
Spoke with patient and she started having rectal pain and knot at rectum this weekend. No hx of hemorrhoids. She is using Preparation H and witch hazel without relief. She will try warm soaks, wiping with baby wipes. Scheduled Ov with Nicoletta Ba, PA on 03/04/15 at 3:00 PM.

## 2015-03-04 ENCOUNTER — Encounter: Payer: Self-pay | Admitting: Physician Assistant

## 2015-03-04 ENCOUNTER — Ambulatory Visit (INDEPENDENT_AMBULATORY_CARE_PROVIDER_SITE_OTHER): Payer: BC Managed Care – PPO | Admitting: Physician Assistant

## 2015-03-04 VITALS — BP 150/88 | HR 76 | Ht 62.0 in | Wt 203.5 lb

## 2015-03-04 DIAGNOSIS — K648 Other hemorrhoids: Secondary | ICD-10-CM

## 2015-03-04 DIAGNOSIS — K6289 Other specified diseases of anus and rectum: Secondary | ICD-10-CM

## 2015-03-04 DIAGNOSIS — K644 Residual hemorrhoidal skin tags: Secondary | ICD-10-CM

## 2015-03-04 MED ORDER — HYDROCORTISONE 2.5 % RE CREA: 1.0000 "application " | TOPICAL_CREAM | Freq: Four times a day (QID) | RECTAL | Status: DC

## 2015-03-04 NOTE — Patient Instructions (Addendum)
Take Benefiber  Daily. We sent a prescription to [facility] , [address] 1. Anusol HC Cream  Also, You can get over the counter Recticare 5% ( lidocain).  Appy 4-5 times daily as needed. Use both the Anusol and the Recticare 7-10-days, then as needed.  Take tub soakes with warm water daily.  Due to an elevated blood pressure we suggest you make an appointment with Dr. [name].

## 2015-03-04 NOTE — Progress Notes (Signed)
Patient ID: Jocelyn [name], female   DOB: [birth year], 59 y.o.   MRN: [id number]   Subjective:    Patient ID: Jocelyn [name], female    DOB: January 02, 1956, 59 y.o.   MRN: [id number]  HPI Feliz is a pleasant 59 year old African-American female known to Dr. Delfin Edis. She has history of obesity hyperlipidemia coronary artery disease and sarcoidosis. She had undergone an endoscopy and colonoscopy in July 2015. At colonoscopy she had 2 sessile polyps which were removed and otherwise negative exam these were tubular adenomas. At EGD she had a gastric polyp at the cardia which was biopsied and removed and this was hyperplastic. Patient comes in today with acute complaint of rectal discomfort and a swollen tender bumper on the outside of her rectum. She says she started having symptoms about 4 days ago. She says she is uncomfortable, but not in any severe pain. She does not remember any straining constipation etc. prior to onset of her symptoms. He says she usually takes Benefiber but had run out and may have been constipated last week. She has no complaints of abdominal pain no rectal bleeding. She says she has a tender swollen Place on the outside of her rectum. He has been using over-the-counter witch hazel and preparation H, without any benefit thus far.  Review of Systems Pertinent positive and negative review of systems were noted in the above HPI section.  All other review of systems was otherwise negative.  Outpatient Encounter Prescriptions as of 03/04/2015  Medication Sig  . ALPRAZolam (XANAX) 0.5 MG tablet take 1/2 tablet by mouth MID MORNING AND MID AFTERNOON, and 1 tablet by mouth at bedtime  . aspirin 81 MG tablet Take 81 mg by mouth daily.    . Cyanocobalamin 1000 MCG/ML KIT Inject 1,000 mg as directed every 30 (thirty) days.  [name] diltiazem (CARDIZEM CD) 240 MG 24 hr capsule take 1 capsule by mouth once daily  . ferrous sulfate 325 (65 FE) MG tablet Take 325 mg by mouth daily with breakfast.  Iron supplement does contain vitamin C as well - pt uses OTC brand  . furosemide (LASIX) 20 MG tablet Take 2 tablets by mouth every morning as needed  . losartan (COZAAR) 50 MG tablet Take 2 tablets (100 mg total) by mouth daily.  . metoprolol tartrate (LOPRESSOR) 25 MG tablet Take 1 tablet (25 mg total) by mouth 2 (two) times daily.  . nitroGLYCERIN (NITROSTAT) 0.4 MG SL tablet Place 1 tablet (0.4 mg total) under the tongue every 5 (five) minutes as needed for chest pain.  [name] ondansetron (ZOFRAN) 4 MG tablet Take 1 tablet (4 mg total) by mouth every 8 (eight) hours as needed for nausea or vomiting.  . pantoprazole (PROTONIX) 40 MG tablet Take 1 tablet (40 mg total) by mouth daily.  . Saxagliptin-Metformin (KOMBIGLYZE XR) 03-999 MG TB24 Take 1 tablet by mouth once.  . valACYclovir (VALTREX) 500 MG tablet Take 500 mg by mouth daily.   . [DISCONTINUED] IRON-VITAMIN C PO Take by mouth daily.  . hydrocortisone (ANUSOL-HC) 2.5 % rectal cream Place 1 application rectally 4 (four) times daily.   Allergies  Allergen Reactions  . Actos [Pioglitazone]     REACTION: pt states INTOL w/ edema  . Codeine     REACTION: vomiting  . Januvia [Sitagliptin] Nausea Only  . Lactose Intolerance (Gi)   . Morphine Nausea Only    REACTION: vomiting  . Sitagliptin Phosphate     REACTION: nausea   Patient Active Problem  List   Diagnosis Date Noted  . Obesity 08/01/2014  . Edema 02/13/2014  . Coronary atherosclerosis of native coronary artery 10/29/2013  . Mixed hyperlipidemia 10/29/2013  . Upper respiratory tract infection 08/08/2013  . Iron (Fe) deficiency anemia 08/08/2013  . Vitamin B 12 deficiency 08/08/2013  . Menopausal symptoms 03/23/2012  . GERD (gastroesophageal reflux disease) 09/08/2011  . Weakness 02/22/2011  . Depression 02/22/2011  . [place] DISEASE 04/30/2009  . VITAMIN D DEFICIENCY 02/05/2009  . Acute bronchitis 09/10/2008  . SARCOIDOSIS 12/12/2007  . Diabetes mellitus type 2,  controlled 12/12/2007  . OBSTRUCTIVE SLEEP APNEA 12/12/2007  . Venous (peripheral) insufficiency 12/12/2007  . RENAL CALCULUS 12/12/2007  . DIZZINESS 12/12/2007  . HYPERCHOLESTEROLEMIA 09/16/2007  . Anxiety state 09/16/2007  . HYPERTENSION, BENIGN 09/16/2007  . Allergic rhinitis 09/16/2007  . IRRITABLE BOWEL SYNDROME 09/16/2007  . Myalgia and myositis 09/16/2007   History   Social History  . Marital Status: Single    Spouse Name: N/A  . Number of Children: 1  . Years of Education: N/A   Occupational History  . Not on file.   Social History Main Topics  . Smoking status: Never Smoker   . Smokeless tobacco: Never Used     Comment: Daily Caffeine - 1  Exercise 2-3 times/weekly  . Alcohol Use: No  . Drug Use: No  . Sexual Activity: Yes    Birth Control/ Protection: None   Other Topics Concern  . Not on file   Social History Narrative   Exercises 2-3 times weekly   Caffeine Use: 1 daily (tea or Pepsi)    Ms. Riggio family history is negative for Colon cancer, Esophageal cancer, Rectal cancer, and Stomach cancer. She was adopted.      Objective:    Filed Vitals:   03/04/15 1557  BP: 150/88  Pulse:     Physical Exam  well-developed African-American female in no acute distress, pleasant, blood pressure 150/88 pulse in the 80s. HEENT; nontraumatic normocephalic EOMI PERRLA sclera anicteric, Supple ;no JVD, Cardiovascular; regular rate and rhythm with S1-S2 no murmur or gallop, Pulmonary; clear bilaterally, Abdomen soft nontender nondistended bowel sounds are active, Rectal; exam patient has a small inflamed external hemorrhoid which is not thrombosed, Extremities; no clubbing cyanosis or edema skin warm and dry, Psych; mood and affect appropriate       Assessment & Plan:  #53 59 year old female with symptomatic external hemorrhoid ,edematous but not thrombosed #2 adenomatous colon polyps last colonoscopy July 2015 due for follow-up July 2020 #3 hyperplastic gastric  polyp #4 GERD #5 sarcoidosis #6 hyperlipidemia #7 AODM   #8 obesity #9 coronary artery disease  Plan; continue tub soaks at least twice daily Baby wipes Restart Benefiber one dose each daily with a large glass of water Start recticare 5% lidocaine cream 3-4 times daily over the next 7-10 days then as needed Start Anusol HC 1% cream 3-4 times daily along with lidocaine Patient is advised her symptoms should gradually resolve over the next couple of weeks and she will call should her pain increase. She'll follow up with Dr. Olevia Perches as needed   Oddie Kuhlmann Genia Harold PA-C 03/04/2015   Cc: Noralee Space, MD

## 2015-03-05 NOTE — Progress Notes (Signed)
Reviewed and agree with conservative treatment. Will see in the office if  Treatment not effective

## 2015-03-13 ENCOUNTER — Encounter: Payer: Self-pay | Admitting: Pulmonary Disease

## 2015-03-13 ENCOUNTER — Ambulatory Visit (INDEPENDENT_AMBULATORY_CARE_PROVIDER_SITE_OTHER)
Admission: RE | Admit: 2015-03-13 | Discharge: 2015-03-13 | Disposition: A | Discharge status: Home / Self Care | Payer: BC Managed Care – PPO | Source: Ambulatory Visit | Attending: Pulmonary Disease | Admitting: Pulmonary Disease

## 2015-03-13 ENCOUNTER — Ambulatory Visit (INDEPENDENT_AMBULATORY_CARE_PROVIDER_SITE_OTHER): Payer: BC Managed Care – PPO | Admitting: Pulmonary Disease

## 2015-03-13 VITALS — BP 146/88 | HR 92 | Temp 98.5°F | Wt 205.0 lb

## 2015-03-13 DIAGNOSIS — R748 Abnormal levels of other serum enzymes: Secondary | ICD-10-CM

## 2015-03-13 DIAGNOSIS — M609 Myositis, unspecified: Secondary | ICD-10-CM

## 2015-03-13 DIAGNOSIS — W19XXXD Unspecified fall, subsequent encounter: Secondary | ICD-10-CM

## 2015-03-13 DIAGNOSIS — J069 Acute upper respiratory infection, unspecified: Secondary | ICD-10-CM

## 2015-03-13 DIAGNOSIS — I2510 Atherosclerotic heart disease of native coronary artery without angina pectoris: Secondary | ICD-10-CM

## 2015-03-13 DIAGNOSIS — I872 Venous insufficiency (chronic) (peripheral): Secondary | ICD-10-CM

## 2015-03-13 DIAGNOSIS — IMO0001 Reserved for inherently not codable concepts without codable children: Secondary | ICD-10-CM

## 2015-03-13 DIAGNOSIS — J301 Allergic rhinitis due to pollen: Secondary | ICD-10-CM

## 2015-03-13 DIAGNOSIS — F411 Generalized anxiety disorder: Secondary | ICD-10-CM

## 2015-03-13 DIAGNOSIS — R296 Repeated falls: Secondary | ICD-10-CM | POA: Insufficient documentation

## 2015-03-13 DIAGNOSIS — I10 Essential (primary) hypertension: Secondary | ICD-10-CM

## 2015-03-13 DIAGNOSIS — D869 Sarcoidosis, unspecified: Secondary | ICD-10-CM

## 2015-03-13 DIAGNOSIS — K219 Gastro-esophageal reflux disease without esophagitis: Secondary | ICD-10-CM

## 2015-03-13 DIAGNOSIS — E7800 Pure hypercholesterolemia, unspecified: Secondary | ICD-10-CM

## 2015-03-13 DIAGNOSIS — E119 Type 2 diabetes mellitus without complications: Secondary | ICD-10-CM

## 2015-03-13 DIAGNOSIS — K589 Irritable bowel syndrome without diarrhea: Secondary | ICD-10-CM

## 2015-03-13 DIAGNOSIS — M791 Myalgia: Secondary | ICD-10-CM

## 2015-03-13 MED ORDER — PREDNISONE 5 MG (21) PO TBPK: 5.0000 mg | ORAL_TABLET | Freq: Every day | ORAL | Status: DC

## 2015-03-13 MED ORDER — LEVOFLOXACIN 500 MG PO TABS: 500.0000 mg | ORAL_TABLET | Freq: Every day | ORAL | Status: DC

## 2015-03-13 MED ORDER — PANTOPRAZOLE SODIUM 40 MG PO TBEC: 40.0000 mg | DELAYED_RELEASE_TABLET | Freq: Every day | ORAL | Status: DC

## 2015-03-13 NOTE — Progress Notes (Signed)
Subjective:    Patient ID: Jocelyn [name], female    DOB: [birth year], 59 y.o.   MRN: [id number]  HPI 59 y/o BF here for a follow up visit... she has multiple medical problems including:  AR & Asthma;  Hx Sarcoid;  Mild OSA;  HBP;  CAD;  Ven Insuffic;  Hyperchol;  DM;  GERD/ IBS;  Hx Kidney stones;  FM;  Vit D defic;  Anxiety... ~  SEE PREV EPIC NOTES FOR THE OLDER DATA >>    LABS 7/13 by Jocelyn [name]> FLP- at goals on Lip+Zetia;  Chems- wnl;  CBC- Hg=12.4, Ferritin low at 14.9, B12= 211; TSH=1.53;   December 04, 2012:  Jocelyn [name] has mult somatic complaints and they all seem to revolve around not resting well & aching/ sore/ tender in chest wall; we reviewed FM diagnosis which she has carried for yrs and prev saw Jocelyn [name]- Rec trial Lunesta 53mQhs & Tramadol Tid prn...      CXR 1/14 showed cardiomeg, clear lungs, no adenopathy, mild scoliosis...   ~  August 08, 2013:  8648moOV & [name] several issues to discuss> 1) c/o sinus congestion- unresponsive to her OTC meds & rec to use Nasal saline mist, Mucinex 120067md, Fluids, and we will treat w/ ZPak & Depo80;  2) told by Jocelyn [name] that Iron was low & needed f/u here but we don't have these labs from him- repeat here today shows Hg=12.5, MCV=88, Fe=47 (12%sat), Ferritin=10.8; she is due for f/u colon w/ Jocelyn [name] & we will refer, rec to start FeSO4 325m79md w/ VitC500 w/ plans for f/u CBC, Fe studies in 2-48mo;54moe continues to f/u w/ Jocelyn [name] regularly- We reviewed the following medical problems during today's office visit >>     AR/ AB/ Hx Sarcoid> on Mucinex & MMW prn; denies cough, sputum, hemoptysis, or ch in dyspnea- she has SOB/DOE but is too sedentary & needs to incr exercise program; some CWP from her FM & we discussed FM rx below...    HBP/ CAD> on ASA81, Imdur120, Toprol50-1/2Bid, Cardizem240, Lasix20prn (seldom takes) & ?Amlodip5 from [facility]?[employer]=132/74 & she has mult somatic c/o- CWP, tired/no energy, but denies palpit, ch in SOB, edema, etc;  followed by Jocelyn [name]- Jocelyn [name]/ Jocelyn [name], no recent notes.    CHOL> on Lip40 => notes from [facility] Jocelyn [name] ?Lip80 +[facility] study drug?; she has gained 4# to 190# today; FLP is followed by Jocelyn [name] per pt & she reports concern for "particle size" & last avail FLP was 7/14 by Jocelyn [name] w/ TChol 190, HDL 100, LDL 40    DM> followed by Jocelyn [name] on Metform500Bid & Onglyza2.5 => ?he changed to Kombiglyze5-1000 daily?; [place] showed BS=105, A1c=6.5; and she remains on the same meds...    GI> GERD, IBS> on Protonix40; denies abd pain, n/v, d/c, or blood seen; last colonoscopy was 7/03 by Jocelyn [name] and reported wnl; f/u due now esp in light of her low Ferritin level...    FM> This is her CC w/ not resting well, wakes tired, poor energy, aching/ sore/ tender (esp in chest in AM) & we discussed the importance of a good night's rest, plus Tramadol 50mgT46mStates she's INTOL Ambien w/ weird dreams & Benedryl/ Melatonin w/o help...    Anxiety> on Xanax0.5mg Pr57mshe was INTOL to Zoloft & she stopped prev Lexapro Rx...    Vit D defic> she tells me that Jocelyn [name] treated her low VitD w/ 50K weekly, then switched to daily OTC supplement...Marland KitchenMarland Kitchen  Vit B12 defic> Jocelyn [name] has her on B12 shots (weekly x4, then monthly) for B12 level done 8/14 = 154...    Borderline anemia & low Ferritin> pt reports that labs from [place] showed this & he wanted [name] to eval & treat; Labs 9/14 showed Hg= 12.5, MCV=88, Fe=47 (12%sat), Ferritin=10.8; she is due for f/u colon w/ Jocelyn [name] & we will refer, rec to start FeSO4 330m Bid w/ VitC500 w/ plans for f/u CBC, Fe studies in 2-329mo. We reviewed prob list, meds, xrays and labs> see below for updates >> SHE DID NOT BRING MED BOTTLES OR CURRENT MED LIST TO THE OV TODAY!!! She is not clear on wat meds have been changed by Jocelyn [name] etHarriett [name].  ~  February 13, 2014:  92m75moV & Jocelyn [name] has mult somatic complaints- recent gastroenteritis (on Zofran & Immodium), c/o swelling all over "they don't  know what it is" states she watches salt & exercises by walking 3-4d/wk... We reviewed the following medical problems during today's office visit >>     AR/ AB/ Hx Sarcoid> on Mucinex & MMW prn; denies cough, sputum, hemoptysis, or ch in dyspnea- she has SOB/DOE but is too sedentary & needs to incr exercise program; some CWP from her FM & we discussed FM rx below...    HBP/ CAD> on ASA81, Toprol50-1/2Bid, Cardizem240, Lasix20prn (ave 1/d) & ?Amlodip5 from [employer]=124/76 & she has mult somatic c/o- CWP, tired/no energy, but denies palpit, ch in SOB, edema, etc; followed by EagSadie Haberrds- Jocelyn [name], no recent notes.    CHOL> off Lipitor => notes from [facility] [employer] ?Lip80 +[facility] study drug? (both stopped due to elev CPK); she has gained 8# to 198# today; FLP is followed by Jocelyn [name] per pt & she reports concern for "particle size" & last avail FLP was 7/14 by Jocelyn [name] w/ TChol 190, HDL 100, LDL 40...    DM> followed by Jocelyn [name] on Metform500Bid & Onglyza5 => ?he changed to Kombiglyze5-1000 daily?; last labs from [place]14 showed BS=105, A1c=6.5; and she remains on the same meds...    GI> GERD, IBS> on Protonix40; denies abd pain, n/v, d/c, or blood seen; last colonoscopy was 7/03 by Jocelyn [name] and reported wnl; f/u due now esp in light of her low Ferritin level...    FM> This is her CC w/ not resting well, wakes tired, poor energy, aching/ sore/ tender (esp in chest in AM) & we discussed the importance of a good night's rest- states she's INTOL Ambien w/ weird dreams & Benedryl/ Melatonin w/o help...    Anxiety> on Xanax0.5mg34mn, she was INTOL to Zoloft & she stopped prev Lexapro Rx...    Vit D defic> she tells me that Jocelyn [name] treated her low VitD w/ 50K weekly, then switched to daily OTC supplement...    Vit B12 defic> Jocelyn [name] has her on B12 shots (weekly x4, then monthly) for B12 level done 8/14 = 154...    Borderline anemia & low Ferritin> pt reports that labs from DrKeMcCarrwed this & he wanted us tKoreaeval  & treat; Labs 9/14 showed Hg= 12.5, MCV=88, Fe=47 (12%sat), Ferritin=10.8; she is due for f/u colon w/ Jocelyn [name] & we will refer, rec to start FeSO4 325mg43m w/ VitC500 w/ plans for f/u CBC- done 4/15 w/ Hg=12.5, Fe=31 (8%sat) but only taking Iron once daily; rec to increase to Bid... We reviewed prob list, meds, xrays and labs> see below for updates >>   CXR 4/15 showed mild cardiomeg, clear lungs, NAD...  LABS 4/15:  Chems- wnl;  CBC- ok w/ Hg=12.5 but Fe=31 (8%);  TSH=1.62;  ACE=56 (8-52);  BNP=26...  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  In the interval she has lost 1#,  BP= 134/80, & she has started FeSO4 1-2/d & GI appt w/ Jocelyn [name] is still pending...  She notes some constip on the Fe and we will rec Ferrosequels or similar product + laxatives prn...  ~  September 09, 2014:  632moOV & here for pulm follow up>  She has hx old Sarcoid in 1980's, no activity x yrs; CXR 4/15 stable, NAD;  She tells me that her daughter noted her to be wheezing this past summer, usually w/ activ but sometimes on the phone; she had recent URI, toook ZPak & improved;  Exam shows scat rhonchi and we discussed need for MUCINEX 12009md, Fluids, etc;  PFT today is ok ?min restriction, no signif obstructive dis...    [facility]> seen 9/15 w/ hx HBP, mild CAD in branch vessel, unable to exercise due to knees, c/o SOB but no CP; he increased her Losartan to 100m32m  Currently taking ASA81, Metoprolol25Bid, DiltiazemCD240, Losartan50-2/d, Lasix20-2Qam; BP= 138/80 & she denies CP, palpit, ch in SOB, edema, etc...     Lipid status is complex> she was on Liptor but this was stopped due to elev musc  enzymes; then she entered drug study thru [facility] but apparently musc enz were elevated there too & med was stopped after a short time, subseq serial musc enz remained elev according to [name]; he challenged her w/ Lip10 & had to stop that too- he has referred her to Rheum but she hasn't gone yet; I have encouraged her to pursue Rheum consultation in light of elev musc enz off all statin & chol meds; she denies weakness but had some leg pain on statin...     She tried Belviq per Jocelyn [name], ok'd by [name]- but she tells me that it made her "feel funny" therefore she stopped it after just 2wks; wt is up 5# to 202# in our office today...    She continues regular f/u w/ Jocelyn [name] for Endocrine> DM & B12 defic;  last note in Epic is 4/15 w/ BS=85, A1c=6.1 on Kombiglyze 03-999 daily and B12 shots 1000mc79mnthly...     She had GI f/u Jocelyn [name] 5/15> iron defic anemia, IBS, constip from Fe; she did EGD 7/15 showing gastric polyp in cardia (path=hyperplastic); and Colonoscopy 7/15 showing 2 sessile polyps in sigmoid (path=tubular adenoma) & rec to take Protonix40 & hi fiberr diet. We reviewed prob list, meds, xrays and labs> see below for updates >>   CXR 4/15 showed mild cardiomeg, clear lungs, NAD...  EMarland KitchenMarland Kitchen 9/15 showed NSR, rate80, 1st degree AVB, otherw norm EKG...  2DEcho 9/15 showed mild LVH, norm LVF w/ EF=60-65%, norm wall motion, Gr1DD, norm valves w/ trivMR...   PFT 10/15 showed FVC=1.93 (79%), FEV1=1.55 (79%), %1sec=80%, mid-flows=70% predicted...   ~  March 13, 2015:  44mo R32mo Marymargaret indicates that she's "hanging in there"; today c/o 1wk hx increased congestion w/ cough/ yellow-green sput, +f/c/s, SOB & wheezing she says; she [facility]  and took a ZPak she had on hand but only min better=> we decided to treat w/ Levaquin, Medrol dosepak, Mucinex & fluids...  She has had numerous subspecialty follow up visits over the last 53mo>      She continues f/u w/ Jocelyn [name] for Cards> seen 3/16 for mild  CAD in a branch vessel, exercise lim by knee arthritis, DOE w/ exertion, exam was neg; rec to continue ASA81, Metop25Bid, CardizemCD240, Losar100, Lasix40; she is off Lipitor & CoQ10; she is concerned about just following her abn CPK values...      She continues to f/u w/ Jocelyn [name] for Endocrine> seen 10/15 for DM (A1c=6.3, Umicroalb=neg), left adrenal adenoma (12x169m& stable, no change over time), B12 defic (level now>1500 on shots); on ASA81, KombiglyzeXR5-1000 one daily, B12 shots monthly;  She has also been [facility] "to learn how to eat healthy & lose weight"; she tries to exercise by [employer] (low impact)...      She had GI f/u w/ AE for Jocelyn [name]> she had EGD & Colon 7/15 (as below); presented c/o ext hem- Rx w/ sitz, baby wipes, benefiberAnusolHC...      Rheum- [name] (seen 11/15- note reviewed), Ortho- [name]> bilat knee pain, XRays showed arthritis, prev given shot & knee brace; Rheum thought her elev CPK was an "isolated elevated CPK" as her strength was 5/5, didn't have polymyositis, norm AST/ALT, etc; they agreed w/ the Dx of fibromyalgia & he rec Flexeril & Alprazolam... The CPK issue appears complicated by Statin [facility] a [facility] clinical trial she enrolled in;  She is concerned due to other somatic complaints including right foot numb, & mult falls "I'm clumsy & fall for no reason" => refer to Neuro for their eval...      We reviewed prob list, meds, xrays and labs> see below for updates >>   CXR showed mild cardiomeg & sl tortuous Ao, clear lungs, DDD of Tspine, NAD...   IMP/ PLAN>>  She has a refractory URI/ bronchitis & we decided to treat w/ Levaquin50068m x7d, Align daily, and Medrol dosepak; she will continue her Mucinex600-2Bid + fluids;  She is still very concerned about the elev CPKs and not at all satisfied by Jocelyn [name]'s plan to just watch it, esp since it is rising she says; she has seen Rheum and  no answer forthcoming so she would like to see Neurology for their opinion & consultation=> [facility]         Problems List:  MENIERE'S DISEASE (ICD-3 - eval by ENT, DrBates- Rx w/ diuretic, low salt, Xanax vs Valium, & Antivert Prn... [facility]iness recurred & [facility] we don't have notes but pt reports that nothing was found.  ALLERGIC RHINITIS - increased allergy symptoms in the spring... rec> Claritin, Saline, Flonase...  Hx of ASTHMATIC BRONCHITIS, ACUTE  - requires occas Antibiotics and Medrol for infectious exac- last Oct09 & resolved w/ Avelox/ Depo/ Medrol... has not been on regular inhalers, but uses XOPENEX MDI (w/ spacer), MUCHomern... ~  essentially neg CTChest 9/08 after CXR suggested RULnodule. ~  CXR 10/09 showed cardiomegaly, clear lungs, min scoliosis, NAD. ~  CXR 9/11 showed cardiomeg, clear lungs, NAD... [name]  CXR 1/14 showed cardiomeg, clear lungs, no adenopathy, mild scoliosis... ~  CXR 4/15 showed mild cardiomeg, clear lungs, NAD... ~  PFT 10/15 showed FVC=1.93 (79%), FEV1=1.55 (79%), %1sec=80%, mid-flows=70% predicted...  ~  CXR  showed mild cardiomeg & sl tortuous Ao, clear lungs, DDD of Tspine, NAD...  Hx of SARCOIDOSIS  - Dx'd 1980's w/ bronch bx... Rx Pred transiently and improved... no active disease x years... ~  baseline CXRs showed cardiomegaly, clear lungs, min scoliosis, NAD.  Hx of Mild OBSTRUCTIVE SLEEP APNEA - sleep eval DrClance 2006 w/ RDI= 5 only.   FOLLOWED by [facility] FOR CARDIOLOGY >>   HYPERTENSION, BENIGN  >> followeed by Jocelyn [name] now for CARDS...  ~  on TOPROL XL 65m- 1.5 tabs daily, CARDIZEM 2416md, LASIX 2040must as needed now... ~  prev OV's 130-140's / 80's... prev noted sl HA, chest tightness, sl SOB, & mult somatic complaints... Labs & renal- all WNL. ~  4/12:  BP=122/76 today, and similar at home per pt... She denies CP, palpit, SOB, edema, etc... Labs & renal- all WNL. ~  1/14: on  ASA81, Imdur120, ToprolXL75, Cardizem240, Lasix20prn (seldom takes); BP=128/80 & she has mult somatic c/o- CWP, tired/no energy, but denies palpit, ch in SOB, edema, etc; followed by EagSadie Haberrds- Jocelyn [name]/ Jocelyn [name] & seen recently but we don't have note; they prev added RANEXA 500m37md for her CP (now off this med); we discussed trial Lunesta 2mgQ41mfor sleep & Tramadol 50mgT47mor pain... ~  9/14:  on ASA81, Imdur120, Toprol50-1/2Bid, Cardizem240, Lasix20prn (seldom takes); BP=132/74 & she has mult somatic c/o- CWP, tired/no energy, but denies palpit, ch in SOB, edema, etc; followed by [facility]oc[facility] Haber- Jocelyn [name]/ Jocelyn [name], no recent notes ~  4/15: on ASA81, Toprol50-1/2Bid, Cardizem240, Lasix20prn (ave 1/d) & ?Amlodip5 from [facility]?;[employer]124/76 & she has mult somatic c/o- CWP, tired/no energy, but denies palpit, ch in SOB, edema, etc; followed by [facility] Jocelyn Haber- Jocelyn [name], no recent notes. ~  4/16: on ASA81, Metop25Bid, CardizemCD240, Losar100, Lasix40; BP= 146/88 and reminded to elim sodium & get weight down...  CORONARY ARTERY DISEASE - on ASA 81mg/d3mDUR 120mg/d,57mrol XL, Cardizem, Lasix as above... Followed by DrTTurner. ~  Cath in 2000 showed non-obstructive CAD (20-30% lesions in all 3 vessels) w/ rec for risk factor modification.   ~  NuclearStressTest 4/07 showed no ischemia & EF=73%. ~  Mar10:  she's concerned about BP and chest tightness, requests Cardiac eval Jocelyn [name] & we will refer... ~  2DEcho 4/10 showed norm LVF w/ EF=55-60%, norm MV, norm AoV, Gr1DD ~  4/10 eval by Jocelyn [name] w/ MYOVIEW- neg- breast attenuation, no regional wall motion abn, EF= 75% ~  4/10 Cath by Jocelyn [name] w/ 90% small 2nd diag branch of LAD <too sm for PTCA> & luminal irreg up to 20% in RCA, +DD- Med Rx. ~  She had more chest tightness & another Myoview from Jocelyn [name] 8/12- it too was neg, no ischemia, no infarct; +breast attenuation; EF=70% & nno regional wall motion abnormalities;  IMDUR has been incr to 120mg/d. 87m1/14: she tells me [facility]VananasWilliam [facility] have [facility] from [facility]> EKG sent showed NSR, rate72, wnl, NAD... ~  EKG 5/14 showed NSR, rate 93, WNL, NAD... ~  12/14: she saw Jocelyn [name] for Cards f/u (note reviewed)> HBP, CAD, Chol; off Imdur w/o angina, continue same meds including both Cardizem & Amlodipine, encouraged to continue [facility] study... ~  She continues regular f/u visits w/ Jocelyn [name], CARDS> his notes are reviewed...  VENOUS INSUFFICIENCY - [facility] and seen by DrSevier 6/08= ven insuffic Rx w/ TED's, no salt, elevation, etc. ~  10/10: notes bilat ankle ?lipoma in front  of the lat malleoli- asymptomatic but several people have noticed them and she request refer to Ortho (rec DrBednarz when she is ready).  HYPERCHOLESTEROLEMIA  - on LIPITOR 45m/d & ZETIA 117md + CoQ10 per Jocelyn [name]... Diet & exercise stressed to the pt. ~  FLP here 12/07 on Lip10 showed TChol 156, TG 94, HDL 51, LDL 86 ~  FLP 1/09 on Lip10 showed TChol 157, TG 122, HDL 49, LDL 84 ~  [facility] showed TChol 176, TG 116, HDL 55, LDL 98 ~  FLP 3/10 on Lip10 showed TChol 170, TG 94, HDL 59, LDL 93 ~  FLP 6/10 by Jocelyn [name] on Lip20 showed TChol 134, TG 82, HDL 55, LDL 73 ~  FLP 4/11 here on Lip20+Zetia? showed TChol 145, TG 74, HDL 68, LDL 63 ~  9/11:  she reports that Jocelyn [name] incr Lipitor to 4064mplus the Zetia 77m54mnd she does her FLPs. ~  FLP here 4/12 on Lip40+Zetia showed TChol 153, TG 54, HDL 53, LDL 90... Copy sent to Jocelyn [name] ~  FLP [facility] 7/13 on Lip40+Zetia10 showed TChol 137, TG 89, HDL 42, LDL 73 ~  9/14: on Lip40 => [place] ?Lip80 +[facility] study drug?; she has gained 4# to 190# today; [facility] & she reports concern for "particle size" & last avail FLP was 7/14 by Jocelyn [name] w/ TChol 190, HDL 100, LDL 40 ~  FLP followed by Jocelyn [name] for CARDS, and Jocelyn [name] for  Endocrine...   FOLLOWED by Jocelyn [name] FOR ENDOCRINOLOGY >>   DIABETES MELLITUS  - on Metformin 500mg7m+ PARLODEL 2.5mg/d58mr DrEllison... She is intol to Actos w/ edema, she stopped Januvia due to ?side effect, she wondered about Tajenta since her daugh works for [employer] 9/08 showed FBS=119, HgA1c=6.6 ~  [facility]= 108, HgA1c= 6.5 ~  labs 3/10 showed BS= 113, A1c= 6.6 ~  labs 6/10 by Jocelyn [name] showed BS= 92; and 3/11 BS= 127 ~  labs 4/11 showed BS= 124, A1c= 7.6... recMarland Kitchen incr Metform to Bid; she [facility]... ~  6/11:  pt requested Endocrine consult & seen by DrEllison w/ Januvia 100mg/d53med. ~  labs 9/11 showed BS= 98, A1c= 7.3... she Marland KitchenMarland Kitchenopped Januvia due to side effects & wonders about using Tajenta instead. ~  DrEllison started BromocyAuto-Owners Insuranced..56mhe is now taking this +Metform 500mgBid.4m  Labs 4/12 (wt=195#) showed Bs= 116, A1c= 7.1 ~  Labs 7/12 showed A1c= 7.2;  Umicroalb= 3.5 ~  Labs 10/12 (wt=186#) showed A1c= 6.7 NOTE: She participated in a [facility] trial called the AfricanAmerican- Diabetes Heart Study in 2012; they sent a report indicating: 1) her memory was fine, but her MRI Brain showed sm vessel dis & mild sinus dis, otherw neg; 2) she may have treatable depression but we tried her on Zoloft & she was intol==> ch to Lexapro. ~  followed by Jocelyn [name] on Metform500Bid & Onglyza2.5; labs from Jocelyn [name] 9/[place] BS=201, A1c=6.0; he rec same meds... ~  9/14: followed by Jocelyn [name] on Metform500Bid & Onglyza2.5 => ?he changed to Kombiglyze5-1000 daily?; last labs from Jocelyn [name] 7/[place] BS=105, A1c=6.5; and she remains on the same meds... ~  1/15: she had f/u Jocelyn [name] for Endocrine> DM, Obesity, Adrenal adenoma, VitB12 defic; labs reviewed, note reviewed- he tried Belviq but she was intol... ~  She continues regular follow up w/ Jocelyn [name]...   FOLLOWED by Jocelyn [name] for  GASTORENTEROLOGY >>   GERD SYMPTOMS - pt placed on PROTONIX  7m/d w/ improvement in reflux symptoms...    ADDENDUM>> note from Jocelyn [name] indicates> upper endoscopy in July 1994 showed antral gastritis. 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  IRRITABLE BOWEL SYNDROME  - colonoscopy 7/03 by Jocelyn [name] was WNL. ~  9/14: she was referred to GI for f/u colon but never did it... ~  4/15: we will refer her to GI again...  RENAL CALCULUS  - sm stone seen in L kidney on CTScan... [facility].  FIBROMYALGIA  - prev Rx by Jocelyn [name] in 2006... ~  1/14: This is her CC w/ not resting well, wakes tired, poor energy, aching/ sore/ tender (esp in chest in AM) & we discussed trial Lunesta 273mhs, & Tramadol 5078md; plus rest, heat, etc... States she's INTOL Ambien w/ weird dreams & Benedryl/ Melatonin w/o help... ~  11/14: she went to the ER w/ LBP> felt to be a lumbar strain... ~  She was seen by [name] for Rheum [id number]   VITAMIN D DEFICIENCY - [facility]... supplemented w/ OTC Vit D 1000 u daily. ~  Vit D level was lower from Jocelyn [name] & he treated w/ Vit D 50K weekly for awhile, then switched to OTC supplement...  ANXIETY - on ALPRAZOLAM 0.5mg48mn... DEPRESSION - prev on Lexapro 20mg65mut she stopped on her own ~10/13 ?cost issues? ~  4/12: c/o feeling sad & anxious all the time- weak, not resting well, under a lot of stress; rec to seek counselling thru her local church or let us reKorear to [facility] w/ Zoloft 50==> but she was INTOL... ~  2013: switched to Lexapro & seems to be doing better but didn't stick w/ it due to cost issues...  ANEMIA & LOW FERRITIN LEVEL >>  B12 DEFICIENCY >> treated by Jocelyn [name] w/ B12 shots ~  9/14: she tells me that Ferritin is low & Jocelyn [name] tried her on Oral Fe prep w/o improvement & wants me to f/u and treat this problem; we discussed need to recheck labs here- LABS 9/14 showed Hg=12.5, MCV=88, Fe=47 (12%sat),  Ferritin=10.8; she is due for f/u colon w/ Jocelyn [name] & we will refer, rec to start FeSO4 325mg 75mw/ VitC500 w/ plans for f/u CBC, Fe studies in 2-78mo; ~72mo15:  Labs showed Hg= 12.5 but Fe=31 (8%); she was only taking the Fe once daily 7 advised to incr to Bid w/ vitC500; needs f/u colon & we will refer to Jocelyn [name] again...  HEALTH MAINTENANCE: ~  GI:  Followed by Jocelyn [name] & colon due 7/13... ~  GYN:  Followed by DrDillard & she gets yearly PAP, Mammogram, hasn't had baseline BMD yet, [facility]. ~  Immunizations:  She gets the yearly Flu shots at school;  Had PNEUMOVAX previously; TDAP given 4/12...   No past surgical history on file - other than Lithotripsy for renal stone...   Outpatient Encounter Prescriptions as of 03/13/2015  Medication Sig  . ALPRAZolam (XANAX) 0.5 MG tablet take 1/2 tablet by mouth MID MORNING AND MID AFTERNOON, and 1 tablet by mouth at bedtime  . aspirin 81 MG tablet Take 81 mg by mouth daily.    . Cyanocobalamin 1000 MCG/ML KIT Inject 1,000 mg as directed every 30 (thirty) days.  . diltiMarland Kitchenzem (CARDIZEM CD) 240 MG 24 hr capsule take 1 capsule by mouth once daily  .  ferrous sulfate 325 (65 FE) MG tablet Take 325 mg by mouth daily with breakfast. Iron supplement does contain vitamin C as well - pt uses OTC brand  . furosemide (LASIX) 20 MG tablet Take 2 tablets by mouth every morning as needed  . hydrocortisone (ANUSOL-HC) 2.5 % rectal cream Place 1 application rectally 4 (four) times daily.  [name] losartan (COZAAR) 50 MG tablet Take 2 tablets (100 mg total) by mouth daily.  . metoprolol tartrate (LOPRESSOR) 25 MG tablet Take 1 tablet (25 mg total) by mouth 2 (two) times daily.  . nitroGLYCERIN (NITROSTAT) 0.4 MG SL tablet Place 1 tablet (0.4 mg total) under the tongue every 5 (five) minutes as needed for chest pain.  [name] ondansetron (ZOFRAN) 4 MG tablet Take 1 tablet (4 mg total) by mouth every 8 (eight) hours as needed for nausea or vomiting.  .  pantoprazole (PROTONIX) 40 MG tablet Take 1 tablet (40 mg total) by mouth daily.  . Saxagliptin-Metformin (KOMBIGLYZE XR) 03-999 MG TB24 Take 1 tablet by mouth once.  . valACYclovir (VALTREX) 500 MG tablet Take 500 mg by mouth daily.     Allergies  Allergen Reactions  . Actos [Pioglitazone]     REACTION: pt states INTOL w/ edema  . Codeine     REACTION: vomiting  . Januvia [Sitagliptin] Nausea Only  . Lactose Intolerance (Gi)   . Morphine Nausea Only    REACTION: vomiting  . Sitagliptin Phosphate     REACTION: nausea    Current Medications, Allergies, Past Medical History, Past Surgical History, Family History, and Social History were reviewed in [employer] record.   Review of Systems        See HPI - all other systems neg except as noted...      The patient complains of weight gain, dyspnea on exertion, and peripheral edema.  The patient denies anorexia, fever, weight loss, vision loss, decreased hearing, hoarseness, chest pain, syncope, prolonged cough, headaches, hemoptysis, abdominal pain, melena, hematochezia, severe indigestion/heartburn, hematuria, incontinence, genital sores, suspicious skin lesions, transient blindness, difficulty walking, depression, unusual weight change, abnormal bleeding, enlarged lymph nodes, and angioedema.     Objective:   Physical Exam     WD, Overweight, 59 y/o BF in NAD... GENERAL:  Alert & oriented; pleasant & cooperative. HEENT:  [place]/AT, EOM-wnl, PERRLA, EACs-clear, TMs-wnl, NOSE-clear, THROAT-clear & wnl. NECK:  Supple w/ fairROM; no JVD; normal carotid impulses w/o bruits; no thyromegaly or nodules palpated; no lymphadenopathy. CHEST:  Clear to P & A; without wheezes, rales or rhonchi, sl tender on palpation of chest wall. HEART:  Regular Rhythm; without murmurs/ rubs/ or gallops. ABDOMEN:  Soft & nontender; normal bowel sounds; no organomegaly or masses detected. EXT: without deformities, mild arthritic changes; no  varicose veins but +venous insuffic & tr edema; +trigger points. NEURO:  CN's intact;  no focal neuro deficits... DERM:  No lesions noted; no rash etc...  RADIOLOGY DATA:  Reviewed in the EPIC EMR & discussed w/ the patient...  LABORATORY DATA:  Reviewed in the EPIC EMR & discussed w/ the patient...   Assessment & Plan:    RESP>  Hx AR, HxAsthma, old sarcoid, mild OSA>  All stable, breathing OK, notes some wheezing w/ activity; exam reveals some secretions in the airway & rec to take MUCINEX 1252m bid, Fluids, etc;. CXR remains stable/ NAD...   CARDS>  Followed by DrTTurner/ VRica Mote?on meds listed? SHE DID NOT BRING MEDS TO THE OV> Hx HBP, CAD, VI, etc;  Chest pain is non-cardiac & likely related to her FM; Doing satis & we reviewed exercise program etc...  CHOL>  Managed by [facility] for Cards- off Lip + off Zetia; ? Taking a study drug from [place]...  DM>  Followed by Jocelyn [name] on Metformin, Onglyzal; A1c improved to 6.7 in 2012, then 6.0 in 2013, & 6.5  In 2014;  rec continue meds & asked to have records sent to [name]...  GI>  Stable on Protonix as needed; see GI eval from Jocelyn [name] 7/15...  FM>  She has long standing FM which appears to have flaired up recently- not resting, no energy, aches & pains, etc; we discussed getting proper sleep- she is intol to [place]; on Tramadol50 prn pain...  Anxiety>  She remains on Alprazolam as needed; & she stopped the Lexapro..  Anemia, Low Ferritin> on FeSO4 382m & VitC 500...   B12 Defic> on B12 shots per Jocelyn [name]...   Patient's Medications  New Prescriptions   LEVOFLOXACIN (LEVAQUIN) 500 MG TABLET    Take 1 tablet (500 mg total) by mouth daily.   PREDNISONE (STERAPRED UNI-PAK 21 TAB) 5 MG (21) TBPK TABLET    Take 1 tablet (5 mg total) by mouth daily. Take as directed  Previous Medications   ALPRAZOLAM (XANAX) 0.5 MG TABLET    take 1/2 tablet by mouth MID MORNING AND MID AFTERNOON, and 1 tablet by mouth at bedtime   ASPIRIN 81 MG  TABLET    Take 81 mg by mouth daily.     CYANOCOBALAMIN 1000 MCG/ML KIT    Inject 1,000 mg as directed every 30 (thirty) days.   DILTIAZEM (CARDIZEM CD) 240 MG 24 HR CAPSULE    take 1 capsule by mouth once daily   FERROUS SULFATE 325 (65 FE) MG TABLET    Take 325 mg by mouth daily with breakfast. Iron supplement does contain vitamin C as well - pt uses OTC brand   FUROSEMIDE (LASIX) 20 MG TABLET    Take 2 tablets by mouth every morning as needed   HYDROCORTISONE (ANUSOL-HC) 2.5 % RECTAL CREAM    Place 1 application rectally 4 (four) times daily.   LOSARTAN (COZAAR) 50 MG TABLET    Take 2 tablets (100 mg total) by mouth daily.   METOPROLOL TARTRATE (LOPRESSOR) 25 MG TABLET    Take 1 tablet (25 mg total) by mouth 2 (two) times daily.   NITROGLYCERIN (NITROSTAT) 0.4 MG SL TABLET    Place 1 tablet (0.4 mg total) under the tongue every 5 (five) minutes as needed for chest pain.   ONDANSETRON (ZOFRAN) 4 MG TABLET    Take 1 tablet (4 mg total) by mouth every 8 (eight) hours as needed for nausea or vomiting.   SAXAGLIPTIN-METFORMIN (KOMBIGLYZE XR) 03-999 MG TB24    Take 1 tablet by mouth once.   VALACYCLOVIR (VALTREX) 500 MG TABLET    Take 500 mg by mouth daily.   Modified Medications   Modified Medication Previous Medication   PANTOPRAZOLE (PROTONIX) 40 MG TABLET pantoprazole (PROTONIX) 40 MG tablet      Take 1 tablet (40 mg total) by mouth daily.    Take 1 tablet (40 mg total) by mouth daily.  Discontinued Medications   No medications on file

## 2015-03-13 NOTE — Patient Instructions (Signed)
Today we updated your med list in our EPIC system...    Continue your current medications the same...  Today we checked your follow up CXR...  For your sinuses, cough, phlegm>    We prescribed LEVAQUIN 500mg  take one tab dailyy til gone...    Also take an OTC probiotic like ALIGN daily...    We have also prescribed a Prednisone dosepak for the inflammation- take as directed til gone...  You may continue the [facility] as well...  Work on weight reduction...  We will arrange for a NEUROLOGY consultation regarding your falling, weakness, & CPK problem...   Let's plan a follow up visit in 57mo, sooner if needed for problems.Marland KitchenMarland Kitchen

## 2015-03-14 ENCOUNTER — Ambulatory Visit: Payer: BC Managed Care – PPO | Admitting: Pulmonary Disease

## 2015-03-29 ENCOUNTER — Other Ambulatory Visit: Payer: Self-pay | Admitting: Pulmonary Disease

## 2015-03-31 ENCOUNTER — Other Ambulatory Visit: Payer: Self-pay

## 2015-03-31 ENCOUNTER — Other Ambulatory Visit: Payer: Self-pay | Admitting: Pulmonary Disease

## 2015-03-31 MED ORDER — DILTIAZEM HCL ER COATED BEADS 240 MG PO CP24: ORAL_CAPSULE | ORAL | Status: DC

## 2015-03-31 NOTE — Telephone Encounter (Signed)
Jettie Booze, MD at 01/29/2015 4:48 PM  diltiazem (CARDIZEM CD) 240 MG 24 hr capsule take 1 capsule by mouth once daily 2. Benign hypertensive heart disease without heart failure  Continue Cardizem CD Capsule Extended Release 24 Hour, 240 MG, 1 capsule, Orally, once a day Off Amlodipine Besylate Tablet, 5 MG, 1 tablet, Orally, Once a day Notes: Controlled after increasing losartan to 100 mg daily. Amlodipine was stopped due to swelling, but the swelling has persisted even off of amlodipine

## 2015-04-04 ENCOUNTER — Other Ambulatory Visit: Payer: Self-pay | Admitting: Pulmonary Disease

## 2015-04-04 NOTE — Telephone Encounter (Signed)
Please advise if okay to refill apraz  Last rx 09/19/14 # 60 with 5 rf  Thanks

## 2015-04-08 NOTE — Telephone Encounter (Signed)
Patient requesting refill on xanax 0.5mg  BID  Last filled 09/29/14 # 60 with 5 refills Last OV 03/14/15 Next OV 09/15/15  SN, please advise Thanks

## 2015-05-02 ENCOUNTER — Ambulatory Visit (INDEPENDENT_AMBULATORY_CARE_PROVIDER_SITE_OTHER): Payer: BC Managed Care – PPO | Admitting: Neurology

## 2015-05-02 ENCOUNTER — Other Ambulatory Visit (INDEPENDENT_AMBULATORY_CARE_PROVIDER_SITE_OTHER): Payer: BC Managed Care – PPO

## 2015-05-02 ENCOUNTER — Encounter: Payer: Self-pay | Admitting: Neurology

## 2015-05-02 VITALS — BP 140/78 | HR 77 | Ht 62.0 in | Wt 207.1 lb

## 2015-05-02 DIAGNOSIS — R748 Abnormal levels of other serum enzymes: Secondary | ICD-10-CM | POA: Diagnosis not present

## 2015-05-02 DIAGNOSIS — M791 Myalgia, unspecified site: Secondary | ICD-10-CM

## 2015-05-02 LAB — SEDIMENTATION RATE: Sed Rate: 28 mm/hr — ABNORMAL HIGH (ref 0–22)

## 2015-05-02 LAB — C-REACTIVE PROTEIN: CRP: 0.7 mg/dL (ref 0.5–20.0)

## 2015-05-02 LAB — CK: CK TOTAL: 1584 U/L — AB (ref 7–177)

## 2015-05-02 NOTE — Patient Instructions (Addendum)
1.  Check blood work 2.  Physical therapy 3.  Please call my office if you would like move forward with the nerve conduction studies and electromyography 4.  Return to clinic in 6 weeks

## 2015-05-02 NOTE — Progress Notes (Signed)
[facility] Clinic Note - Initial Visit   Date: 05/05/2015   Jocelyn [name] MRN: [id number] DOB: 1956/07/04   Dear Dr. [name]:  Thank you for your kind referral of Jocelyn [name] for consultation of mylagias and elevated CK. Although her history is well known to you, please allow [name] to reiterate it for the purpose of our medical record. The patient was accompanied to the clinic by self.    History of Present Illness: Jocelyn [name] is a 59 y.o. right-handed African American female with sarcoidosis, CAD, hyperCKemia, diabetes mellitus, GERD, iron deficient anemia, hyperlipidemia, OSA, and hypertension  presenting for evaluation of myalgias and elevated CK.    She reports being in a study with [employer] for cholesterol medication and was found to have elevated CK on surveillance blood work, so was taken off the study medication.  In 2015, her CK was 737 and it has slowly trended up with her last CK in May 1100 (tested elsewhere).  She was taken off lipitor in March.    For the past year, she has developed generalized muscle pain involving the arms, legs, chest, and back.  Since January 2016, she has been stumbling and falling twice per month which is getting more frequent.  She has not suffered any significant injuries.  She walks independently.    She has occasional numbness and tingling of the hands and feet.  There is no cramping, muscle stiffness, or twitches of the muscles.  She denies any dark tea colored urine, shortness of breath, or dysphagia/dysarthria.  She saw Dr. Charlestine Night, rheumatology, whose evaluation was negative.   She has also been evaluated by Dr. Theressa Stamps and was treated for possible fibromyalgia.    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    Out-side paper records, electronic medical record, and images have been reviewed where available and summarized as:   Component     Latest Ref Rng 08/14/2014 11/27/2014  02/24/2015  CK Total     7 - 177 U/L 737 (H) 748 (H) 1413 (H)   Lab Results  Component Value Date   HGBA1C 6.7* 09/08/2011    Lab Results  Component Value Date   ESRSEDRATE 28* 05/02/2015    Lab Results  Component Value Date   TSH 1.62 02/13/2014    CT head 12/18/2007:  normal   Past Medical History  Diagnosis Date  . Meniere's disease, unspecified   . Allergic rhinitis, cause unspecified   . Acute bronchitis   . Sarcoidosis   . Obstructive sleep apnea (adult) (pediatric)   . Essential hypertension, benign   . Coronary atherosclerosis of unspecified type of vessel, native or graft   . Unspecified venous (peripheral) insufficiency   . Pure hypercholesterolemia   . Type II or unspecified type diabetes mellitus without mention of complication, not stated as uncontrolled   . Irritable bowel syndrome   . Calculus of kidney   . Fibromyalgia   . Vitamin D deficiency   . Dizziness   . Anxiety   . Myalgia and myositis, unspecified   . Calculus of kidney   . BV (bacterial vaginosis) 06/22/1996  . H/O varicella   . Headache(784.0)     frequently  . Yeast infection   . Menses, irregular 2003  . Perimenopausal symptoms 2003  . Fibroid 2003  . H/O dysmenorrhea 2008  . HSV-2 infection 2009  . Vulvitis 2010  . B12 deficiency   . Anemia   .  Hyperplastic colon polyp 05/16/2014    Past Surgical History  Procedure Laterality Date  . Tonsillectomy    . Heart catherization       Medications:  Current Outpatient Prescriptions on File Prior to Visit  Medication Sig Dispense Refill  . ALPRAZolam (XANAX) 0.5 MG tablet take 1/2 tablet by mouth IN THE MID-MORNING AND MID-AFTERNOON, and take 1 tablet once daily at bedtime if needed 60 tablet 5  . aspirin 81 MG tablet Take 81 mg by mouth daily.      . Cyanocobalamin 1000 MCG/ML KIT Inject 1,000 mg as directed every 30 (thirty) days.    [name] diltiazem (CARDIZEM CD) 240 MG 24 hr capsule take 1 capsule by mouth once daily 90 capsule 2    . ferrous sulfate 325 (65 FE) MG tablet Take 325 mg by mouth daily with breakfast. Iron supplement does contain vitamin C as well - pt uses OTC brand    . furosemide (LASIX) 20 MG tablet Take 2 tablets by mouth every morning as needed    . hydrocortisone (ANUSOL-HC) 2.5 % rectal cream Place 1 application rectally 4 (four) times daily. 30 g 1  . losartan (COZAAR) 50 MG tablet Take 2 tablets (100 mg total) by mouth daily. 60 tablet 4  . metoprolol tartrate (LOPRESSOR) 25 MG tablet Take 1 tablet (25 mg total) by mouth 2 (two) times daily. 60 tablet 3  . nitroGLYCERIN (NITROSTAT) 0.4 MG SL tablet Place 1 tablet (0.4 mg total) under the tongue every 5 (five) minutes as needed for chest pain. 25 tablet 5  . ondansetron (ZOFRAN) 4 MG tablet Take 1 tablet (4 mg total) by mouth every 8 (eight) hours as needed for nausea or vomiting. 20 tablet 1  . pantoprazole (PROTONIX) 40 MG tablet Take 1 tablet (40 mg total) by mouth daily. 180 tablet 1  . Saxagliptin-Metformin (KOMBIGLYZE XR) 03-999 MG TB24 Take 1 tablet by mouth once.    . valACYclovir (VALTREX) 500 MG tablet Take 500 mg by mouth daily.      No current facility-administered medications on file prior to visit.    Allergies:  Allergies  Allergen Reactions  . Actos [Pioglitazone]     REACTION: pt states INTOL w/ edema  . Codeine     REACTION: vomiting  . Januvia [Sitagliptin] Nausea Only  . Lactose Intolerance (Gi)   . Morphine Nausea Only    REACTION: vomiting  . Sitagliptin Phosphate     REACTION: nausea    Family History: Family History  Problem Relation Age of Onset  . Adopted: Yes  . Colon cancer Neg Hx   . Esophageal cancer Neg Hx   . Rectal cancer Neg Hx   . Stomach cancer Neg Hx     Social History: History   Social History  . Marital Status: Single    Spouse Name: N/A  . Number of Children: 1  . Years of Education: N/A   Occupational History  . Not on file.   Social History Main Topics  . Smoking status: Never  Smoker   . Smokeless tobacco: Never Used     Comment: Daily Caffeine - 1  Exercise 2-3 times/weekly  . Alcohol Use: No  . Drug Use: No  . Sexual Activity: Yes    Birth Control/ Protection: None   Other Topics Concern  . Not on file   Social History Narrative   Exercises 2-3 times weekly   Caffeine Use: 1 daily (tea or [employer]epsi)   Lives alone in  a one story home.  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      Review of Systems:  CONSTITUTIONAL: No fevers, chills, night sweats, or weight loss.   EYES: No visual changes or eye pain ENT: No hearing changes.  No history of nose bleeds.   RESPIRATORY: No cough, wheezing and shortness of breath.   CARDIOVASCULAR: Negative for chest pain, and palpitations.   GI: Negative for abdominal discomfort, blood in stools or black stools.  No recent change in bowel habits.   GU:  No history of incontinence.   MUSCLOSKELETAL: No history of joint pain or swelling.  No myalgias.   SKIN: Negative for lesions, rash, and itching.   HEMATOLOGY/ONCOLOGY: Negative for prolonged bleeding, bruising easily, and swollen nodes.  No history of cancer.   ENDOCRINE: Negative for cold or heat intolerance, polydipsia or goiter.   PSYCH:  No depression or anxiety symptoms.   NEURO: As Above.   Vital Signs:  BP 140/78 mmHg  Pulse 77  Ht 5' 2"  (1.575 m)  Wt 207 lb 2 oz (93.951 kg)  BMI 37.87 kg/m2  SpO2 93%   General Medical Exam:   General:  Well appearing, comfortable.   Eyes/ENT: see cranial nerve examination.   Neck: No masses appreciated.  Full range of motion without tenderness.  No carotid bruits. Respiratory:  Clear to auscultation, good air entry bilaterally.   Cardiac:  Regular rate and rhythm, no murmur.   Extremities:  No deformities, edema, or skin discoloration.  Skin:  No rashes or lesions.  Neurological Exam: MENTAL STATUS including orientation to time, place, person, recent and remote memory, attention span and concentration, language, and  fund of knowledge is normal.  Speech is not dysarthric.  CRANIAL NERVES: II:  No visual field defects.  Unremarkable fundi.   III-IV-VI: Pupils equal round and reactive to light.  Normal conjugate, extra-ocular eye movements in all directions of gaze.  No nystagmus.  No ptosis.   V:  Normal facial sensation.    VII:  Normal facial symmetry and movements.  No pathologic facial reflexes.  VIII:  Normal hearing and vestibular function.   IX-X:  Normal palatal movement.   XI:  Normal shoulder shrug and head rotation.   XII:  Normal tongue strength and range of motion, no deviation or fasciculation.  MOTOR: Tenderness to palpation of nearly all the muscle groups.  Motor strength testing is limited by patient's effort/pain - all muscle groups are at least antigravity and able to resist (> 4+/5).  Neck flexion and extension is 5/5.  No atrophy, fasciculations or abnormal movements.  No pronator drift.  Tone is normal.  No myotonia on exam.  MSRs:  Right                                                                 Left brachioradialis 2+  brachioradialis 2+  biceps 2+  biceps 2+  triceps 2+  triceps 2+  patellar 2+  patellar 2+  ankle jerk 2+  ankle jerk 2+  Hoffman no  Hoffman no  plantar response down  plantar response down   SENSORY:  Normal and symmetric perception of light touch, pinprick, vibration, and proprioception.  Romberg's sign absent.   COORDINATION/GAIT: Normal finger-to- nose-finger and heel-to-shin.  Intact rapid alternating movements bilaterally.  Able to rise from a chair without using arms.  Gait narrow based and stable. Unsteady with tandem and stressed gait.    IMPRESSION: Mrs. Malveaux is a 59 year-old female presenting for evaluation of generalized myalgias and hyperCKemia.  Her exam is non-focal and motor strength testing is limited by patient's effort and pain.  To better investigate her symptoms, laboratory testing will be ordered.  EMG was discussed to look for  myopathic changes, as this may help a potential muscle biopsy, if needed, but patient wishes to hold on doing the EMG because of pain (she has previously had it performed for CTS). Infiltrative diseases such as sarcoidosis of the muscle is a consideration given her history.    PLAN/RECOMMENDATIONS:  1.  Check CK, ESR, CRP, ANA, SPEP/UPEP with IFE, ENA, myositis panel 2.  Discussed NCS/EMG of the right arm and leg, but patient would like to hold off on this 3.  Physical therapy  4.  Return to clinic in 6 weeks.   The duration of this appointment visit was 40 minutes of face-to-face time with the patient.  Greater than 50% of this time was spent in counseling, explanation of diagnosis, planning of further management, and coordination of care.   Thank you for allowing me to participate in patient's care.  If I can answer any additional questions, I would be pleased to do so.    Sincerely,    Lera Gaines K. Posey Pronto, DO

## 2015-05-05 LAB — ENA 9 PANEL
Centromere Ab Screen: <1
ENA SM Ab Ser-aCnc: <1
Jo-1 Antibody, IgG: <1
Ribosomal P Protein Ab: <1
SCLERODERMA (SCL-70) (ENA) ANTIBODY, IGG: NEGATIVE
SM/RNP: NEGATIVE
SSA (Ro) (ENA) Antibody, IgG: <1
SSB (La) (ENA) Antibody, IgG: <1

## 2015-05-05 LAB — ANA: ANA: NEGATIVE

## 2015-05-06 LAB — SPEP & IFE WITH QIG
ALBUMIN ELP: 4 g/dL (ref 3.8–4.8)
Alpha-1-Globulin: 0.3 g/dL (ref 0.2–0.3)
Alpha-2-Globulin: 0.9 g/dL (ref 0.5–0.9)
Beta 2: 0.4 g/dL (ref 0.2–0.5)
Beta Globulin: 0.4 g/dL (ref 0.4–0.6)
Gamma Globulin: 1 g/dL (ref 0.8–1.7)
IgA: 84 mg/dL (ref 69–380)
IgG (Immunoglobin G), Serum: 1060 mg/dL (ref 690–1700)
IgM, Serum: 86 mg/dL (ref 52–322)
Total Protein, Serum Electrophoresis: 6.9 g/dL (ref 6.1–8.1)

## 2015-05-06 LAB — UIFE/LIGHT CHAINS/TP QN, 24-HR UR
ALPHA 1 UR: DETECTED — AB
ALPHA 2 UR: DETECTED — AB
Albumin, U: DETECTED
Beta, Urine: DETECTED — AB
Gamma Globulin, Urine: DETECTED — AB
Total Protein, Urine: 9 mg/dL (ref 5–24)

## 2015-05-08 ENCOUNTER — Other Ambulatory Visit: Payer: Self-pay

## 2015-05-08 MED ORDER — METOPROLOL TARTRATE 25 MG PO TABS: 25.0000 mg | ORAL_TABLET | Freq: Two times a day (BID) | ORAL | Status: DC

## 2015-05-14 NOTE — Progress Notes (Signed)
Addendum  Labs 05/02/2015:  CK 1584*, ESR 28*, CRP 0.7, ANA neg, SPEP/UPEP with IFE no M protein, ENA neg, myositis panel positive for PM-Scl 100 antibody**.  Results discussed with patient.  She is antibody positive for PM-Scl 100 and has exam features consist with myositis.  She is not interested in electrodiagnostic testing or muscle biopsy.  We will refer her to rheumatology with [facility] (Drs. Birdie Sons, or Ouida Sills).  Jayland Null K. Posey Pronto, DO

## 2015-05-15 NOTE — Progress Notes (Signed)
Referral sent 

## 2015-05-22 ENCOUNTER — Telehealth: Payer: Self-pay | Admitting: Neurology

## 2015-05-22 NOTE — Telephone Encounter (Signed)
Called patient's daughter, but there was no answer.  Message left on voicemail to return call.

## 2015-05-22 NOTE — Telephone Encounter (Signed)
Called patient back and let her know that her appointment is going to be with [facility] instead of [facility].  She said that they had already called her with an appointment on August 1.  I will cancel the other appointment for August 30.  She was asking if you wouldn't mind calling her daughter.  [facility] [phone].

## 2015-05-22 NOTE — Telephone Encounter (Signed)
VM-Pt stated she missed a call from Dr Patel/Dawn [phone]

## 2015-05-22 NOTE — Telephone Encounter (Signed)
Pt called in regards top a referral to a rheumatologist/Dawn CB#[phone] or [phone]

## 2015-05-23 NOTE — Telephone Encounter (Signed)
Returned call and answered questions 

## 2015-05-23 NOTE — Telephone Encounter (Signed)
Noted.  Patient called back stating she missed a call from Dr. Posey Pronto.  Note routed to Dr. Posey Pronto.

## 2015-05-23 NOTE — Telephone Encounter (Signed)
Did you call patient?

## 2015-06-09 ENCOUNTER — Other Ambulatory Visit: Payer: Self-pay

## 2015-06-09 MED ORDER — LOSARTAN POTASSIUM 50 MG PO TABS: 100.0000 mg | ORAL_TABLET | Freq: Every day | ORAL | Status: DC

## 2015-06-19 ENCOUNTER — Ambulatory Visit (INDEPENDENT_AMBULATORY_CARE_PROVIDER_SITE_OTHER): Payer: BC Managed Care – PPO | Admitting: Neurology

## 2015-06-19 ENCOUNTER — Encounter: Payer: Self-pay | Admitting: Neurology

## 2015-06-19 VITALS — BP 140/80 | HR 82 | Wt 199.6 lb

## 2015-06-19 DIAGNOSIS — M609 Myositis, unspecified: Secondary | ICD-10-CM

## 2015-06-19 DIAGNOSIS — R748 Abnormal levels of other serum enzymes: Secondary | ICD-10-CM

## 2015-06-19 DIAGNOSIS — M791 Myalgia, unspecified site: Secondary | ICD-10-CM

## 2015-06-19 DIAGNOSIS — IMO0001 Reserved for inherently not codable concepts without codable children: Secondary | ICD-10-CM

## 2015-06-19 NOTE — Patient Instructions (Addendum)
EMG of the right arm and leg, August 23th at 3pm - please arrive 20 minutes earlier. I will call with the results of your testing and share with Dr. Amil Amen

## 2015-06-19 NOTE — Progress Notes (Signed)
Follow-up Visit   Date: 06/19/2015    Jocelyn [name] MRN: [id number] DOB: 1956-03-15   Interim History: Jocelyn [name] is a 59 y.o. right-handed African American female with sarcoidosis, CAD, hyperCKemia, diabetes mellitus, GERD, iron deficient anemia, hyperlipidemia, OSA, and hypertension returning to the clinic for follow-up of myalgias and hyperCKemia.  The patient was accompanied to the clinic by self.  History of present illness: She reports being in a study with [employer] for cholesterol medication and was found to have elevated CK on surveillance blood work, so was taken off the study medication.  In 2015, her CK was 737 and it has slowly trended up with her last CK in May 1100 (tested elsewhere).  She was taken off lipitor in March.    Since 2015, she has developed generalized muscle pain involving the arms, legs, chest, and back and starting in January 2016, she has been stumbling and falling twice per month which is getting more frequent.  She has not suffered any significant injuries.  She walks independently.    She has occasional numbness and tingling of the hands and feet.  There is no cramping, muscle stiffness, or twitches of the muscles.  She denies any dark tea colored urine, shortness of breath, or dysphagia/dysarthria.  She saw Dr. Charlestine Night, rheumatology, whose evaluation was negative.   She has also been evaluated by Dr. Theressa Stamps and was treated for possible fibromyalgia.    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  UPDATE 06/19/2015:  She reports having one fall in [place] this week and needed assistance to stand to raise.  Her last fall prior to that as a month ago. Myalgias seems improved and she is more comfortable today.  She is enjoying physical therapy and really liked her visit with Dr. Amil Amen.  Her recent lab studies continue to show elevated CK.  Her myositis panel was positive for PM-Scl 100 antibody and she was referred to  see Dr. Amil Amen in rheumatology, but I'm not certain that the results of her myositis panel were sent with our referral notes.  He did recommend that she pursue EMG, which she was reluctant about at the last visit.    Medications:  Current Outpatient Prescriptions on File Prior to Visit  Medication Sig Dispense Refill  . ALPRAZolam (XANAX) 0.5 MG tablet take 1/2 tablet by mouth IN THE MID-MORNING AND MID-AFTERNOON, and take 1 tablet once daily at bedtime if needed 60 tablet 5  . aspirin 81 MG tablet Take 81 mg by mouth daily.      . Cyanocobalamin 1000 MCG/ML KIT Inject 1,000 mg as directed every 30 (thirty) days.    [name] diltiazem (CARDIZEM CD) 240 MG 24 hr capsule take 1 capsule by mouth once daily 90 capsule 2  . ferrous sulfate 325 (65 FE) MG tablet Take 325 mg by mouth daily with breakfast. Iron supplement does contain vitamin C as well - pt uses OTC brand    . furosemide (LASIX) 20 MG tablet Take 2 tablets by mouth every morning as needed    . hydrocortisone (ANUSOL-HC) 2.5 % rectal cream Place 1 application rectally 4 (four) times daily. 30 g 1  . losartan (COZAAR) 50 MG tablet Take 2 tablets (100 mg total) by mouth daily. 60 tablet 4  . metoprolol tartrate (LOPRESSOR) 25 MG tablet Take 1 tablet (25 mg total) by mouth 2 (two) times daily. 60 tablet 6  . nitroGLYCERIN (NITROSTAT) 0.4 MG SL tablet Place  1 tablet (0.4 mg total) under the tongue every 5 (five) minutes as needed for chest pain. 25 tablet 5  . ondansetron (ZOFRAN) 4 MG tablet Take 1 tablet (4 mg total) by mouth every 8 (eight) hours as needed for nausea or vomiting. 20 tablet 1  . pantoprazole (PROTONIX) 40 MG tablet Take 1 tablet (40 mg total) by mouth daily. 180 tablet 1  . Saxagliptin-Metformin (KOMBIGLYZE XR) 03-999 MG TB24 Take 1 tablet by mouth once.    . valACYclovir (VALTREX) 500 MG tablet Take 500 mg by mouth daily.      No current facility-administered medications on file prior to visit.    Allergies:  Allergies    Allergen Reactions  . Actos [Pioglitazone]     REACTION: pt states INTOL w/ edema  . Codeine     REACTION: vomiting  . Januvia [Sitagliptin] Nausea Only  . Lactose Intolerance (Gi)   . Morphine Nausea Only    REACTION: vomiting  . Sitagliptin Phosphate     REACTION: nausea    Review of Systems:  CONSTITUTIONAL: No fevers, chills, night sweats, or weight loss.  EYES: No visual changes or eye pain ENT: No hearing changes.  No history of nose bleeds.   RESPIRATORY: No cough, wheezing and shortness of breath.   CARDIOVASCULAR: Negative for chest pain, and palpitations.   GI: Negative for abdominal discomfort, blood in stools or black stools.  No recent change in bowel habits.   GU:  No history of incontinence.   MUSCLOSKELETAL: No history of joint pain or swelling.  +myalgias.   SKIN: Negative for lesions, rash, and itching.   ENDOCRINE: Negative for cold or heat intolerance, polydipsia or goiter.   PSYCH:  No depression or anxiety symptoms.   NEURO: As Above.   Vital Signs:  BP 140/80 mmHg  Pulse 82  Wt 199 lb 9 oz (90.521 kg)  SpO2 98%  Neurological Exam: MENTAL STATUS including orientation to time, place, person, recent and remote memory, attention span and concentration, language, and fund of knowledge is normal.  Speech is not dysarthric.  CRANIAL NERVES: No visual field defects.  Pupils equal round and reactive to light.  Normal conjugate, extra-ocular eye movements in all directions of gaze.  No ptosis.  Face is symmetric. Facial movements intact.  Palate elevates symmetrically.  Tongue is midline.  MOTOR:  No atrophy, fasciculations or abnormal movements.  No pronator drift.  Tone is normal.  There is tenderness to palpation over the proximal limb girdle muscles.   Neck flexion and extension is 5/5 Right Upper Extremity:    Left Upper Extremity:    Deltoid  5-/5   Deltoid  5-/5   Biceps  5-/5   Biceps  5-/5   Triceps  5/5   Triceps  5/5   Wrist extensors  5/5    Wrist extensors  5/5   Wrist flexors  5/5   Wrist flexors  5/5   Finger extensors  5/5   Finger extensors  5/5   Finger flexors  5/5   Finger flexors  5/5   Dorsal interossei  5/5   Dorsal interossei  5/5   Abductor pollicis  5/5   Abductor pollicis  5/5   Tone (Ashworth scale)  0  Tone (Ashworth scale)  0   Right Lower Extremity:    Left Lower Extremity:    Hip flexors  4+/5   Hip flexors  4+/5   Hip extensors  5-/5   Hip extensors  5-/5  Knee flexors  5-/5   Knee flexors  5-/5   Knee extensors  5-/5   Knee extensors  5-/5   Dorsiflexors  5/5   Dorsiflexors  5/5   Plantarflexors  5/5   Plantarflexors  5/5   Toe extensors  5/5   Toe extensors  5/5   Toe flexors  5/5   Toe flexors  5/5   Tone (Ashworth scale)  0  Tone (Ashworth scale)  0   MSRs:  Reflexes are 2+/4 throughout  COORDINATION/GAIT:  She is able to rise from chair without using arms.   Gait narrow based and stable.   Data: Labs 06/16/2015:  CK 1861* Labs 05/02/2015:  CK 1584*, ESR 28*, CRP 0.7, ANA neg, SPEP/UPEP with IFE no M protein, ENA neg, myositis panel positive for PM-Scl 100 antibody**. Component     Latest Ref Rng 08/14/2014 11/27/2014 01/27/2015 02/24/2015 05/02/2015  CK Total     7 - 177 U/L 737 (H) 748 (H) 1329 (H) 1413 (H) 1584 (H)    IMPRESSION/PLAN: Ms. Tinner is a pleasant 60 year-old female returning for evaluation of generalized myalgias and hyperCKemia.  Her recent lab testing indicates PM-Scl 100 antibody positivity, which is seen in individuals with polymyositis/scleroderma. ESR was mildly elevated at 28 and CRP is normal.   I will share these results with Dr. Melissa Noon office to see if he feels symptoms are consistent with polymyosistis. She does not have any skin changes.    At her previous visit, we discussed obtaining EMG but she had declined due to pain.  She is ready to move forward with NCS/EMG of the right arm and leg.  Infiltrative diseases such as sarcoidosis of the muscle is another consideration  given her history.     Results of the EMG will be communicated with patient and Dr. Amil Amen.  I will gladly see patient back after her rheumatological evaluation as needed.    The duration of this appointment visit was 30 minutes of face-to-face time with the patient.  Greater than 50% of this time was spent in counseling, explanation of diagnosis, planning of further management, and coordination of care.   Thank you for allowing me to participate in patient's care.  If I can answer any additional questions, I would be pleased to do so.    Sincerely,    Vashti Bolanos K. Posey Pronto, DO

## 2015-06-20 NOTE — Progress Notes (Signed)
Note and labs faxed

## 2015-07-08 ENCOUNTER — Ambulatory Visit: Payer: BC Managed Care – PPO | Admitting: Neurology

## 2015-07-15 ENCOUNTER — Ambulatory Visit (INDEPENDENT_AMBULATORY_CARE_PROVIDER_SITE_OTHER): Payer: BC Managed Care – PPO | Admitting: Neurology

## 2015-07-15 DIAGNOSIS — M791 Myalgia, unspecified site: Secondary | ICD-10-CM

## 2015-07-15 DIAGNOSIS — R748 Abnormal levels of other serum enzymes: Secondary | ICD-10-CM | POA: Diagnosis not present

## 2015-07-15 DIAGNOSIS — G729 Myopathy, unspecified: Secondary | ICD-10-CM

## 2015-07-16 NOTE — Procedures (Signed)
[facility]  [address], [address]  [place], [place] [address] Tel: [phone] Fax:  [phone] Test Date:  07/15/2015  Patient: Jocelyn [name] DOB: Apr 29, 1956 Physician: Narda Amber  Sex: Female Height: 5\' 2"  Ref Phys: Narda Amber, D.O.  ID#: [id number] Temp: 34.7C Technician: Jerilynn Mages. Dean   Patient Complaints: This is a 59 year old female presenting for evaluation of generalized myalgias, falls, and hyperCKemia.  NCV & EMG Findings: Extensive electrodiagnostic testing of the right upper and lower extremity shows:  1. Right median, radial, sural, and superficial peroneal sensory responses are within normal limits. 2. Bilateral peroneal motor responses recording at the extensor digitorum brevis are reduced, however when recording at the right tibialis anterior, the amplitude is within normal limits. Right median and ulnar motor responses are within normal limits. 3. In the upper extremities, there is no evidence of active or chronic motor axon loss changes affecting any of the tested muscles.  4. In the lower extremities, sparse small, complex, polyphasic motor units are seen affecting the muscles of the lower extremities, which is more prominent proximally. There is no evidence of muscle membrane irritability or early recruitment.  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 Overall, these findings are mild in degree electrically.  Clinical correlation recommended.   ___________________________ Narda Amber, DO    Nerve Conduction Studies Anti Sensory Summary Table   Stim Site NR Peak (ms) Norm Peak (ms) P-T Amp (V) Norm P-T Amp  Right Median Anti Sensory (2nd Digit)  Wrist    3.5 <3.8 18.1 >10  Right Radial Anti Sensory (Base 1st Digit)  34.7C  Wrist    1.8 <2.8 13.3 >10  Right Sup Peroneal Anti Sensory (Ant Lat Mall)  34.7C  12 cm    4.0 <4.6 11.2 >3  Right Sural Anti  Sensory (Lat Mall)  34.7C  Calf    2.7 <4.6 11.2 >3   Motor Summary Table   Stim Site NR Onset (ms) Norm Onset (ms) O-P Amp (mV) Norm O-P Amp Site1 Site2 Delta-0 (ms) Dist (cm) Vel (m/s) Norm Vel (m/s)  Right Median Motor (Abd Poll Brev)  34.7C  Wrist    3.9 <4.0 6.2 >5 Elbow Wrist 3.9 22.0 56 >50  Elbow    7.8  5.8         Left Peroneal Motor (Ext Dig Brev)  34.7C  Ankle    4.4 <6.0 1.5 >2.5 B Fib Ankle 6.3 30.0 48 >40  B Fib    10.7  1.2  Poplt B Fib 1.6 9.0 56 >40  Poplt    12.3  1.2         Right Peroneal Motor (Ext Dig Brev)  Ankle    4.8 <6.0 0.2 >2.5 B Fib Ankle 4.8 23.5 49 >40  B Fib    9.6  0.2  Poplt B Fib 1.8 9.0 50 >40  Poplt    11.4  0.2         Right Peroneal TA Motor (Tib Ant)  Fib Head    2.3 <4.5 3.4 >3 Poplit Fib Head 1.8 10.0 56 >40  Poplit    4.1  3.1         Right Ulnar Motor (Abd Dig Minimi)  34.7C  Wrist    2.1 <3.1 7.3 >7 B Elbow Wrist 3.3 19.0 58 >50  B Elbow    5.4  6.4  A Elbow B Elbow 1.7 10.0 59 >50  A Elbow    7.1  6.2          EMG   Side Muscle Ins Act Fibs Psw Fasc Number Recrt Dur Dur. Amp Amp. Poly Poly. Comment  Right AntTibialis Nml Nml Nml Nml 1+ Nml Few 1+ Few 1- Few 1+ N/A  Right FlexPolLong Nml Nml Nml Nml Nml Nml Nml Nml Nml Nml Nml Nml N/A  Right 1stDorInt Nml Nml Nml Nml Nml Nml Nml Nml Nml Nml Nml Nml N/A  Right BrachioRad Nml Nml Nml Nml Nml Nml Nml Nml Nml Nml Nml Nml N/A  Right PronatorTeres Nml Nml Nml Nml Nml Nml Nml Nml Nml Nml Nml Nml N/A  Right Biceps Nml Nml Nml Nml Nml Nml Nml Nml Nml Nml Nml Nml N/A  Right Triceps Nml Nml Nml Nml Nml Nml Nml Nml Nml Nml Nml Nml N/A  Right Deltoid Nml Nml Nml Nml Nml Nml Nml Nml Nml Nml Nml Nml N/A  Right Ext Indicis Nml Nml Nml Nml Nml Nml Nml Nml Nml Nml Nml Nml N/A  Right Cervical Parasp Low Nml Nml Nml Nml Nml Nml Nml Nml Nml Nml Nml Nml N/A  Right Lumbo Parasp Low Nml Nml Nml Nml Nml Nml Nml Nml Nml Nml Nml Nml N/A  Right Gastroc Nml Nml Nml Nml 1+ Nml Some 1+ Few 1- Few 1+ N/A    Right RectFemoris Nml Nml Nml Nml Nml Nml Some 1+ Some 1- Some 1+ N/A  Right Iliacus Nml Nml Nml Nml Nml Nml Few 1+ Few 1- Nml Nml N/A  Right GluteusMed Nml Nml Nml Nml Nml Nml Some 1+ Some 1- Some 1+ N/A      Waveforms:

## 2015-07-30 LAB — HM DIABETES EYE EXAM

## 2015-09-15 ENCOUNTER — Encounter: Payer: Self-pay | Admitting: Pulmonary Disease

## 2015-09-15 ENCOUNTER — Ambulatory Visit: Payer: BC Managed Care – PPO | Admitting: Pulmonary Disease

## 2015-09-15 ENCOUNTER — Ambulatory Visit (INDEPENDENT_AMBULATORY_CARE_PROVIDER_SITE_OTHER): Payer: BC Managed Care – PPO | Admitting: Pulmonary Disease

## 2015-09-15 VITALS — BP 136/82 | HR 75 | Temp 98.6°F | Wt 205.2 lb

## 2015-09-15 DIAGNOSIS — I10 Essential (primary) hypertension: Secondary | ICD-10-CM | POA: Diagnosis not present

## 2015-09-15 DIAGNOSIS — Z862 Personal history of diseases of the blood and blood-forming organs and certain disorders involving the immune mechanism: Secondary | ICD-10-CM | POA: Diagnosis not present

## 2015-09-15 DIAGNOSIS — F411 Generalized anxiety disorder: Secondary | ICD-10-CM

## 2015-09-15 DIAGNOSIS — E1121 Type 2 diabetes mellitus with diabetic nephropathy: Secondary | ICD-10-CM

## 2015-09-15 DIAGNOSIS — M3320 Polymyositis, organ involvement unspecified: Secondary | ICD-10-CM | POA: Diagnosis not present

## 2015-09-15 DIAGNOSIS — IMO0001 Reserved for inherently not codable concepts without codable children: Secondary | ICD-10-CM

## 2015-09-15 DIAGNOSIS — M791 Myalgia: Secondary | ICD-10-CM

## 2015-09-15 DIAGNOSIS — E7800 Pure hypercholesterolemia, unspecified: Secondary | ICD-10-CM

## 2015-09-15 DIAGNOSIS — I2510 Atherosclerotic heart disease of native coronary artery without angina pectoris: Secondary | ICD-10-CM

## 2015-09-15 DIAGNOSIS — M609 Myositis, unspecified: Secondary | ICD-10-CM

## 2015-09-15 DIAGNOSIS — I872 Venous insufficiency (chronic) (peripheral): Secondary | ICD-10-CM

## 2015-09-15 DIAGNOSIS — E669 Obesity, unspecified: Secondary | ICD-10-CM

## 2015-09-15 MED ORDER — ALBUTEROL SULFATE HFA 108 (90 BASE) MCG/ACT IN AERS: 1.0000 | INHALATION_SPRAY | Freq: Four times a day (QID) | RESPIRATORY_TRACT | Status: DC | PRN

## 2015-09-15 MED ORDER — PANTOPRAZOLE SODIUM 40 MG PO TBEC: 40.0000 mg | DELAYED_RELEASE_TABLET | Freq: Every day | ORAL | Status: DC

## 2015-09-15 NOTE — Patient Instructions (Signed)
Today we updated your med list in our EPIC system...    Continue your current medications the same...  We reviewed your recent eval from Neurology, Rheumatology & Endocrine...    Continue the Prednisone, MTX, & Folic under the direction of DrBeekman...  We wrote for an ALBUTEROL inhaler to use as needed for wheezing...  Continue the Mucinex for the congestion...  Add a PROBIOTIC like ALIGN, [employer] colon health, Digestive advantage, etc for the lower abd discomfort...  Call for any questions...  Let's plan a follow up visit in 56mo, sooner if needed for problems.Marland KitchenMarland Kitchen

## 2015-09-15 NOTE — Progress Notes (Signed)
Subjective:    Patient ID: Jocelyn [name], female    DOB: 10/08/1956, 59 y.o.   MRN: [id number]  HPI 59 y/o BF here for a follow up visit... she has multiple medical problems including:  AR & Asthma;  Hx Sarcoid;  Mild OSA;  HBP;  CAD;  Ven Insuffic;  Hyperchol;  DM;  GERD/ IBS;  Hx Kidney stones;  FM;  Vit D defic;  Anxiety... ~  SEE PREV EPIC NOTES FOR THE OLDER DATA >>    LABS 7/13 by [name]> FLP- at goals on Lip+Zetia;  Chems- wnl;  CBC- Hg=12.4, Ferritin low at 14.9, B12= 211; TSH=1.53;   December 04, 2012:  Wania has mult somatic complaints and they all seem to revolve around not resting well & aching/ sore/ tender in chest wall; we reviewed FM diagnosis which she has carried for yrs and prev saw [name]- Rec trial Lunesta 61mQhs & Tramadol Tid prn...      CXR 1/14 showed cardiomeg, clear lungs, no adenopathy, mild scoliosis..[name]   ~  February 13, 2014:  61moOV & Daune has mult somatic complaints- recent gastroenteritis (on Zofran & Immodium), c/o swelling all over "they don't know what it is" states she watches salt & exercises by walking 3-4d/wk... We reviewed the following medical problems during today's office visit >>     AR/ AB/ Hx Sarcoid> on Mucinex & MMW prn; denies cough, sputum, hemoptysis, or ch in dyspnea- she has SOB/DOE but is too sedentary & needs to incr exercise program; some CWP from her FM & we discussed FM rx below...    HBP/ CAD> on ASA81, Toprol50-1/2Bid, Cardizem240, Lasix20prn (ave 1/d) & ?Amlodip5 from [employer] BP=124/76 & she has mult somatic c/o- CWP, tired/no energy, but denies palpit, ch in SOB, edema, etc; followed by [name]- [name], no recent notes.    CHOL> off Lipitor => notes from [employer] ?Lip80 +[employer] study drug? (both stopped due to elev CPK); she has gained 8# to 198# today; [facility] & she reports concern for "particle size" & last avail FLP was 7/14 by [name] w/ TChol 190, HDL 100, LDL 40...    DM> followed by  [name] on Metform500Bid & Onglyza5 => ?he changed to Kombiglyze5-1000 daily?; last labs from DrChapin/14 showed BS=105, A1c=6.5; and she remains on the same meds...    GI> GERD, IBS> on Protonix40; denies abd pain, n/v, d/c, or blood seen; last colonoscopy was 7/03 by [name] and reported wnl; f/u due now esp in light of her low Ferritin level...    FM> This is her CC w/ not resting well, wakes tired, poor energy, aching/ sore/ tender (esp in chest in AM) & we discussed the importance of a good night's rest- states she's INTOL Ambien w/ weird dreams & Benedryl/ Melatonin w/o help...    Anxiety> on Xanax0.37m63mrn, she was INTOL to Zoloft & she stopped prev Lexapro Rx...    Vit D defic> she tells me that [name] treated her low VitD w/ 50K weekly, then switched to daily OTC supplement...    Vit B12 defic> [name] has her on B12 shots (weekly x4, then monthly) for B12 level done 8/14 = 154...    Borderline anemia & low Ferritin> pt reports that labs from DrKFluvannaowed this & he wanted us [name] eval & treat; Labs 9/14 showed Hg= 12.5, MCV=88, Fe=47 (12%sat), Ferritin=10.8; she is due for f/u colon w/ [name] & we will refer, rec to start  FeSO4 367m Bid w/ VitC500 w/ plans for f/u CBC- done 4/15 w/ Hg=12.5, Fe=31 (8%sat) but only taking Iron once daily; rec to increase to Bid... We reviewed prob list, meds, xrays and labs> see below for updates >>   CXR 4/15 showed mild cardiomeg, clear lungs, NAD...  LABS 4/15:  Chems- wnl;  CBC- ok w/ Hg=12.5 but Fe=31 (8%);  TSH=1.62;  ACE=56 (8-52);  BNP=26...  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  In the interval she has lost 1#,  BP= 134/80, & she has started FeSO4  1-2/d & GI appt w/ [name] is still pending...  She notes some constip on the Fe and we will rec Ferrosequels or similar product + laxatives prn...  ~  September 09, 2014:  61m23moV & here for pulm follow up>  She has hx old Sarcoid in 1980's, no activity x yrs; CXR 4/15 stable, NAD;  She tells me that her daughter noted her to be wheezing this past summer, usually w/ activ but sometimes on the phone; she had recent URI, toook ZPak & improved;  Exam shows scat rhonchi and we discussed need for MUCINEX 1200m476m, Fluids, etc;  PFT today is ok ?min restriction, no signif obstructive dis...    [facility] seen 9/15 w/ hx HBP, mild CAD in branch vessel, unable to exercise due to knees, c/o SOB but no CP; he increased her Losartan to 100mg276m Currently taking ASA81, Metoprolol25Bid, DiltiazemCD240, Losartan50-2/d, Lasix20-2Qam; BP= 138/80 & she denies CP, palpit, ch in SOB, edema, etc...     Lipid status is complex> she was on Liptor but this was stopped due to elev musc enzymes; then she entered drug study thru [facility] but apparently musc enz were elevated there too & med was stopped after a short time, subseq serial musc enz remained elev according to [name]; he challenged her w/ Lip10 & had to stop that too- he has referred her to Rheum but she hasn't gone yet; I have encouraged her to pursue Rheum consultation in light of elev musc enz off all statin & chol meds; she denies weakness but had some leg pain on statin...     She tried Belviq per [name], ok'd by [name]- but she tells me that it made her "feel funny" therefore she stopped it after just 2wks; wt is up 5# to 202# in our office today...    She continues regular f/u w/ [name] for Endocrine> DM & B12 defic;  last note in Epic is 4/15 w/ BS=85, A1c=6.1 on Kombiglyze 03-999 daily and B12 shots 1000mcg40mthly...     She had GI f/u [name] 5/15> iron defic anemia, IBS, constip from Fe; she did EGD 7/15 showing gastric polyp in  cardia (path=hyperplastic); and Colonoscopy 7/15 showing 2 sessile polyps in sigmoid (path=tubular adenoma) & rec to take Protonix40 & hi fiberr diet. We reviewed prob list, meds, xrays and labs> see below for updates >>   CXR 4/15 showed mild cardiomeg, clear lungs, NAD...  EKMarland KitchenMarland Kitchen9/15 showed NSR, rate80, 1st degree AVB, otherw norm EKG...  2DEcho 9/15 showed mild LVH, norm LVF w/ EF=60-65%, norm wall motion, Gr1DD,  norm valves w/ trivMR...   PFT 10/15 showed FVC=1.93 (79%), FEV1=1.55 (79%), %1sec=80%, mid-flows=70% predicted...   ~  March 13, 2015:  30moROV & Bijal indicates that she's "hanging in there"; today c/o 1wk hx increased congestion w/ cough/ yellow-green sput, +f/c/s, SOB & wheezing she says; she started on OTC MUCINEX and took a ZPak she had on hand but only min better=> we decided to treat w/ Levaquin, Medrol dosepak, Mucinex & fluids...  She has had numerous subspecialty follow up visits over the last 63mo      She continues f/u w/ [name] for Cards> seen 3/16 for mild CAD in a branch vessel, exercise lim by knee arthritis, DOE w/ exertion, exam was neg; rec to continue ASA81, Metop25Bid, CardizemCD240, Losar100, Lasix40; she is off Lipitor & CoQ10; she is concerned about just following her abn CPK values...      She continues to f/u w/ [name] for Endocrine> seen 10/15 for DM (A1c=6.3, Umicroalb=neg), left adrenal adenoma (12x1444m stable, no change over time), B12 defic (level now>1500 on shots); on ASA81, KombiglyzeXR5-1000 one daily, B12 shots monthly;  She has also been evaluated by [facility] "to learn how to eat healthy & lose weight"; she tries to exercise by [employer] (low impact)...      She had GI f/u w/ AE for [name]> she had EGD & Colon 7/15 (as below); presented c/o ext hem- Rx w/ sitz, baby wipes, benefiberAnusolHC...      Rheum- [name] (seen 11/15- note reviewed), Ortho- [name]> bilat knee pain, XRays showed arthritis,  prev given shot & knee brace; Rheum thought her elev CPK was an "isolated elevated CPK" as her strength was 5/5, didn't have polymyositis, norm AST/ALT, etc; they agreed w/ the Dx of fibromyalgia & he rec Flexeril & Alprazolam... The CPK issue appears complicated by Statin rx [facility] trial she enrolled in;  She is concerned due to other somatic complaints including right foot numb, & mult falls "I'm clumsy & fall for no reason" => refer to Neuro for their eval...      We reviewed prob list, meds, xrays and labs> see below for updates >>   CXR showed mild cardiomeg & sl tortuous Ao, clear lungs, DDD of Tspine, NAD... IMP/ PLAN>>  She has a refractory URI/ bronchitis & we decided to treat w/ Levaquin500m42mx7d, Align daily, and Medrol dosepak; she will continue her Mucinex600-2Bid + fluids;  She is still very concerned about the elev CPKs and not at all satisfied by [name]'s plan to just watch it, esp since it is rising she says; she has seen Rheum and no answer forthcoming so she would like to see Neurology for their opinion & consultation=> [facility] ~  September 15, 2015:  53mo 67mo& Henya saw [name] in Neuro- felt to have a myopathy and labs showed elev CPK (as hi as 1860, and a pos PM-Scl 100 antibody;  Clinically she had musc aches and some falling episodes, some numbness & tingling, no skin changes;  EMG/NCV was performed (most consistent with a non-necrotizing myopathy affecting the proximal leg muscles)- [facility] who felt she has polymyositis or a PM/scleroderma overlap syndrome, he wanted to get a musc bx but decided to start Rx rather than wait & she is now on Pred & MTX w/ Folic... [name] manages her Endocrine system- DM, adrenal adenoma, B12 defic, VitD defic,  Hx low Fe (seen last 03/2015 w/ A1c=6.3, Hg=12.8, Ferritin =19, B12=551, VitD=20... Since she has started on the Pred & MTX her CPK & aldolase enzymes have  normalized & she has not fallen...     AR/ AB/ Hx Sarcoid> on Mucinex & MMW prn; denies cough, sputum, hemoptysis, or ch in dyspnea- she has SOB/DOE but is too sedentary & needs to incr exercise program; she requests a rescue inhaler for occas wheezing she encounters=> ProairHFA written for prn use...     HBP/ CAD> on ASA81, Toprol50-1/2Bid, Cardizem240, Cozaar50-2/d, Lasix20-2/d; BP=136/82 & she notes some chest discomfort & edema, denies palpit/ ch in SOB etc ; followed by [facility]- [name], no recent notes.    CHOL> off Lipitor w/ hx elev CPK (on diet alone); FLP is followed by [facility] per pt & she reports concern for "particle size" & last avail FLP was 3/16 showed TChol 181, TG 162, HDL 44, LDL 104    DM> followed by [name] on Kombiglyze => [name] follows her labs and his notes are reviewed...    GI> GERD, IBS> on Protonix40; denies abd pain, n/v, d/c, or blood seen; last colonoscopy was 7/03 by [name] and reported wnl; f/u due now esp in light of her low Ferritin level...    Polymyositis, HxFM> SEE ABOVE and notes from [name] & DrBeekman now on Pred, MTX, & Folic...     Anxiety> on Xanax0.17m Prn, she was INTOL to Zoloft & she stopped prev Lexapro Rx...    Vit D defic> she tells me that [name] treated her low VitD w/ 50K weekly, then switched to daily OTC supplement...    Vit B12 defic> [name] has her on B12 shots (weekly x4, then monthly) for B12 level done 8/14 = 154...    Borderline anemia & low Ferritin> she was treated w/ Fe from [name] & improved... EXAM shows Afeb, VSS, O2sat=96% on RA;  HEENT- neg, mallampati2;  Chest- decr BS but clear w/o w/r/r;  Heart- RR gr1/6 SEM w/o r/g;  Abd- soft, neg;  Ext- VI, tr edema... IMP/PLAN>>  DDeshantahas been diagnosed w/ polymyositis & started on Pred+MTX by DGlean Salen  She will continue Rx from him, add a Probiotic daily to aide digestion & we wrote for a rescue inhaler per her request...          Problems List:  MENIERE'S DISEASE  (ICD-3 - eval by ENT, DrBates- Rx w/ diuretic, low salt, Xanax vs Valium, & Antivert Prn... dizziness recurred & [facility] we don't have notes but pt reports that nothing was found.  ALLERGIC RHINITIS - increased allergy symptoms in the spring... rec> Claritin, Saline, Flonase...  Hx of ASTHMATIC BRONCHITIS, ACUTE  - requires occas Antibiotics and Medrol for infectious exac- last Oct09 & resolved w/ Avelox/ Depo/ Medrol... has not been on regular inhalers, but uses XOPENEX MDI (w/ spacer), MAthensPrn... ~  essentially neg CTChest 9/08 after CXR suggested RULnodule. ~  CXR 10/09 showed cardiomegaly, clear lungs, min scoliosis, NAD. ~  CXR 9/11 showed cardiomeg, clear lungs, NAD..[name] ~  CXR 1/14 showed cardiomeg, clear lungs, no adenopathy, mild scoliosis... ~  CXR 4/15 showed mild cardiomeg, clear lungs, NAD... ~  PFT 10/15 showed FVC=1.93 (79%), FEV1=1.55 (79%), %1sec=80%, mid-flows=70% predicted...  ~  CXR showed mild cardiomeg & sl tortuous Ao, clear lungs, DDD of Tspine, NAD... ~  10/16:  We wrote for an AlbutHFA inhaler for prn use at her request for wheezing...  Hx  of SARCOIDOSIS  - Dx'd 1980's w/ bronch bx... Rx Pred transiently and improved... no active disease x years... ~  baseline CXRs showed cardiomegaly, clear lungs, min scoliosis, NAD.  Hx of Mild OBSTRUCTIVE SLEEP APNEA - sleep eval DrClance 2006 w/ RDI= 5 only.   FOLLOWED by [facility] FOR CARDIOLOGY >>   HYPERTENSION, BENIGN  >> followeed by [name] now for CARDS...  ~  on TOPROL XL 26m- 1.5 tabs daily, CARDIZEM 2491md, LASIX 2044must as needed now... ~  prev OV's 130-140's / 80's... prev noted sl HA, chest tightness, sl SOB, & mult somatic complaints... Labs & renal- all WNL. ~  4/12:  BP=122/76 today, and similar at home per pt... She denies CP, palpit, SOB, edema, etc... Labs & renal- all WNL. ~  1/14: on ASA81, Imdur120, ToprolXL75, Cardizem240, Lasix20prn (seldom takes); BP=128/80 & she  has mult somatic c/o- CWP, tired/no energy, but denies palpit, ch in SOB, edema, etc; followed by [facility]- DrTurner/ [name] & seen recently but we don't have note; they prev added RANEXA 500m42md for her CP (now off this med); we discussed trial Lunesta 2mgQ92mfor sleep & Tramadol 50mgT30mor pain... ~  9/14:  on ASA81, Imdur120, Toprol50-1/2Bid, Cardizem240, Lasix20prn (seldom takes); BP=132/74 & she has mult somatic c/o- CWP, tired/no energy, but denies palpit, ch in SOB, edema, etc; followed by [facility]- DrTurner/ [name], no recent notes ~  4/15: on ASA81, Toprol50-1/2Bid, Cardizem240, Lasix20prn (ave 1/d) & ?Amlodip5 from [facility]?[employer]124/76 & she has mult somatic c/o- CWP, tired/no energy, but denies palpit, ch in SOB, edema, etc; followed by [facility]- [name], no recent notes. ~  4/16: on ASA81, Metop25Bid, CardizemCD240, Losar100, Lasix40; BP= 146/88 and reminded to elim sodium & get weight down...  CORONARY ARTERY DISEASE - on ASA 81mg/d8mDUR 120mg/d,88mrol XL, Cardizem, Lasix as above... Followed by DrTTurner. ~  Cath in 2000 showed non-obstructive CAD (20-30% lesions in all 3 vessels) w/ rec for risk factor modification.   ~  NuclearStressTest 4/07 showed no ischemia & EF=73%. ~  Mar10:  she's concerned about BP and chest tightness, requests Cardiac eval DrTurner & we will refer... ~  2DEcho 4/10 showed norm LVF w/ EF=55-60%, norm MV, norm AoV, Gr1DD ~  4/10 eval by DrTurner w/ MYOVIEW- neg- breast attenuation, no regional wall motion abn, EF= 75% ~  4/10 Cath by DrTurner w/ 90% small 2nd diag branch of LAD <too sm for PTCA> & luminal irreg up to 20% in RCA, +DD- Med Rx. ~  She had more chest tightness & another Myoview from DrTurner 8/12- it too was neg, no ischemia, no infarct; +breast attenuation; EF=70% & nno regional wall motion abnormalities;  IMDUR has been incr to 120mg/d. 50m/14: she tells me [facility]anasL[facility]'t have notes from him> EKG  sent showed NSR, rate72, wnl, NAD... ~  EKG 5/14 showed NSR, rate 93, WNL, NAD... ~  12/14: she saw [name] for Cards f/u (note reviewed)> HBP, CAD, Chol; off Imdur w/o angina, continue same meds including both Cardizem & Amlodipine, encouraged to continue [employer] study... ~  She continues regular f/u visits w/ [name], CARDS> his notes are reviewed...  VENOUS INSUFFICIENCY - [facility] by DrWWoods and seen by DrSevier 6/08= ven insuffic Rx w/ TED's, no salt, elevation, etc. ~  10/10: notes bilat ankle ?lipoma in front of the lat malleoli- asymptomatic but several people have noticed them and she request refer  to Ortho Heritage manager when she is ready).  HYPERCHOLESTEROLEMIA  - on LIPITOR 85m/d & ZETIA 174md + CoQ10 per DrTurner... Diet & exercise stressed to the pt. ~  FLP here 12/07 on Lip10 showed TChol 156, TG 94, HDL 51, LDL 86 ~  FLP 1/09 on Lip10 showed TChol 157, TG 122, HDL 49, LDL 84 ~  [facility] Lip10 showed TChol 176, TG 116, HDL 55, LDL 98 ~  FLP 3/10 on Lip10 showed TChol 170, TG 94, HDL 59, LDL 93 ~  FLP 6/10 by DrTurner on Lip20 showed TChol 134, TG 82, HDL 55, LDL 73 ~  FLP 4/11 here on Lip20+Zetia? showed TChol 145, TG 74, HDL 68, LDL 63 ~  9/11:  she reports that DrTurner incr Lipitor to 4037mplus the Zetia 71m56mnd she does her FLPs. ~  FLP here 4/12 on Lip40+Zetia showed TChol 153, TG 54, HDL 53, LDL 90... Copy sent to DrTurner ~  FLP [place] 7/13 on Lip40+Zetia10 showed TChol 137, TG 89, HDL 42, LDL 73 ~  9/14: on Lip40 => notes from KerrDanas ?Lip80 +[facility] study drug?; she has gained 4# to 190# today; [facility] & she reports concern for "particle size" & last avail FLP was 7/14 by [name] w/ TChol 190, HDL 100, LDL 40 ~  FLP followed by [name] for CARDS, and [name] for Endocrine...   FOLLOWED by [name] FOR ENDOCRINOLOGY >>   DIABETES MELLITUS  - on Metformin 500mg23m+  PARLODEL 2.5mg/d83mr DrEllison... She is intol to Actos w/ edema, she stopped Januvia due to ?side effect, she wondered about Tajenta since her daugh works for [employer] 9/08 showed FBS=119, HgA1c=6.6 ~  [facility]= 108, HgA1c= 6.5 ~  labs 3/10 showed BS= 113, A1c= 6.6 ~  labs 6/10 by DrTurner showed BS= 92; and 3/11 BS= 127 ~  labs 4/11 showed BS= 124, A1c= 7.6... recMarland Kitchen incr Metform to Bid; she [facility]... ~  6/11:  pt requested Endocrine consult & seen by DrEllison w/ Januvia 100mg/d99med. ~  labs 9/11 showed BS= 98, A1c= 7.3... she Marland KitchenMarland Kitchenopped Januvia due to side effects & wonders about using Tajenta instead. ~  DrEllison started BromocyAuto-Owners Insuranced..20mhe is now taking this +Metform 500mgBid.88m  Labs 4/12 (wt=195#) showed Bs= 116, A1c= 7.1 ~  Labs 7/12 showed A1c= 7.2;  Umicroalb= 3.5 ~  Labs 10/12 (wt=186#) showed A1c= 6.7 NOTE: She participated in a [facility] trial called the AfricanAmerican- Diabetes Heart Study in 2012; they sent a report indicating: 1) her memory was fine, but her MRI Brain showed sm vessel dis & mild sinus dis, otherw neg; 2) she may have treatable depression but we tried her on Zoloft & she was intol==> ch to Lexapro. ~  followed by [name] on Metform500Bid & Onglyza2.5; labs from [name] 9/Wadewed BS=201, A1c=6.0; he rec same meds... ~  9/14: followed by [name] on Metform500Bid & Onglyza2.5 => ?he changed to Kombiglyze5-1000 daily?; last labs from [name] 7/[place] Summitwed BS=105, A1c=6.5; and she remains on the same meds... ~  1/15: she had f/u [name] for Endocrine> DM, Obesity, Adrenal adenoma, VitB12 defic; labs reviewed, note reviewed- he tried Belviq but she was intol... ~  She continues regular follow up w/ [name]...   FOLLOWED by [name] for GASTORENTEROLOGY >>   GERD SYMPTOMS - pt placed on PROTONIX 40mg/d w/38mrovement in  reflux symptoms...    ADDENDUM>> note from [name] indicates> upper endoscopy in  July 1994 showed antral gastritis. 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  IRRITABLE BOWEL SYNDROME  - colonoscopy 7/03 by [name] was WNL. ~  9/14: she was referred to GI for f/u colon but never did it... ~  4/15: we will refer her to GI again...  RENAL CALCULUS  - sm stone seen in L kidney on CTScan... she had lithotripsy by [facility] ~9/09.  POLYMYOSITIS >>  FIBROMYALGIA  - prev Rx by [name] in 2006... ~  1/14: This is her CC w/ not resting well, wakes tired, poor energy, aching/ sore/ tender (esp in chest in AM) & we discussed trial Lunesta 757mQhs, & Tramadol 57mid; plus rest, heat, etc... States she's INTOL Ambien w/ weird dreams & Benedryl/ Melatonin w/o help... ~  11/14: she went to the ER w/ LBP> felt to be a lumbar strain... ~  She was seen by [name] for Rheum [id number].  ~  2016> DeLangley Gaussaw [name] in Neuro- felt to have a myopathy and labs showed elev CPK (as hi as 1860, and a pos PM-Scl 100 antibody;  Clinically she had musc aches and some falling episodes, some numbness & tingling, no skin changes;  EMG/NCV was performed (most consistent with a non-necrotizing myopathy affecting the proximal leg muscles)- she was [facility] who felt she has polymyositis or a PM/scleroderma overlap syndrome, he wanted to get a musc bx but decided to start Rx rather than wait & she is now on Pred & MTX w/ Folic  VITAMIN D DEFICIENCY - [facility]... supplemented w/ OTC Vit D 1000 u daily. ~  Vit D level was lower from [name] & he treated w/ Vit D 50K weekly for awhile, then switched to OTC supplement...  ANXIETY - on ALPRAZOLAM 0.57m62mrn... DEPRESSION - prev on Lexapro 13m11mbut she stopped on her own ~10/13 ?cost issues? ~  4/12: c/o feeling sad & anxious all the time- weak, not resting well, under a lot of stress; rec to seek counselling thru her local church or [facility] w/ Zoloft 50==> but she was INTOL... ~  2013: switched to Lexapro & seems to be doing better but didn't stick w/ it due to cost issues...  ANEMIA & LOW FERRITIN LEVEL >>  B12 DEFICIENCY >> treated by [name] w/ B12 shots ~  9/14: she tells me that Ferritin is low & [name] tried her on Oral Fe prep w/o improvement & wants me to f/u and treat this problem; we discussed need to recheck labs here- LABS 9/14 showed Hg=12.5, MCV=88, Fe=47 (12%sat), Ferritin=10.8; she is due for f/u colon w/ [name] & we will refer, rec to start FeSO4 3257mg357m w/ VitC500 w/ plans for f/u CBC, Fe studies in 2-62mo; 562mo/15:  Labs showed Hg= 12.5 but Fe=31 (8%); she was only taking the Fe once daily 7 advised to incr to Bid w/ vitC500; needs f/u colon & we will refer to [name] again...  HEALTH MAINTENANCE: ~  GI:  Followed by [name] & colon due 7/13... ~  GYN:  Followed by DrDillard & she gets yearly PAP, Mammogram, hasn't had baseline BMD yet, [facility] ~  Immunizations:  She gets the yearly Flu shots at school;  Had PNEUMOVAX previously; TDAP given 4/12...   No past surgical history on file -  other than Lithotripsy for renal stone...   Outpatient Encounter Prescriptions as of 09/15/2015  Medication Sig  . ALPRAZolam (XANAX) 0.5 MG tablet take 1/2 tablet by mouth IN THE MID-MORNING AND MID-AFTERNOON, and take 1 tablet once daily at bedtime if needed  . aspirin 81 MG tablet Take 81 mg by mouth daily.    . Cyanocobalamin 1000 MCG/ML KIT Inject 1,000 mg as directed every 30 (thirty) days.  [name] diltiazem (CARDIZEM CD) 240 MG 24 hr capsule take 1 capsule by mouth once daily  . ferrous sulfate 325 (65 FE) MG tablet Take 325 mg by mouth daily with breakfast. Iron supplement does contain vitamin C as well - pt uses OTC brand  . folic acid (FOLVITE) 1 MG tablet Take 1 mg by mouth daily.  . furosemide (LASIX) 20 MG tablet Take 2 tablets by mouth every morning as needed  . losartan (COZAAR)  50 MG tablet Take 2 tablets (100 mg total) by mouth daily.  . methotrexate (RHEUMATREX) 2.5 MG tablet Take 12.5 mg by mouth once a week. Caution:Chemotherapy. Protect from light.   PREDNISONE 56m >>                    <<Take as diected by DrBeekman >>   . metoprolol tartrate (LOPRESSOR) 25 MG tablet Take 1 tablet (25 mg total) by mouth 2 (two) times daily.  . nitroGLYCERIN (NITROSTAT) 0.4 MG SL tablet Place 1 tablet (0.4 mg total) under the tongue every 5 (five) minutes as needed for chest pain.  .Marland Kitchenondansetron (ZOFRAN) 4 MG tablet Take 1 tablet (4 mg total) by mouth every 8 (eight) hours as needed for nausea or vomiting.  . pantoprazole (PROTONIX) 40 MG tablet Take 1 tablet (40 mg total) by mouth daily.  . valACYclovir (VALTREX) 500 MG tablet Take 500 mg by mouth daily.   . hydrocortisone (ANUSOL-HC) 2.5 % rectal cream Place 1 application rectally 4 (four) times daily. (Patient not taking: Reported on 09/15/2015)  . Saxagliptin-Metformin (KOMBIGLYZE XR) 03-999 MG TB24 Take 1 tablet by mouth once.   No facility-administered encounter medications on file as of 09/15/2015.    Allergies  Allergen Reactions  . Actos [Pioglitazone]     REACTION: pt states INTOL w/ edema  . Codeine     REACTION: vomiting  . Januvia [Sitagliptin] Nausea Only  . Lactose Intolerance (Gi)   . Morphine Nausea Only    REACTION: vomiting  . Sitagliptin Phosphate     REACTION: nausea    Current Medications, Allergies, Past Medical History, Past Surgical History, Family History, and Social History were reviewed in [employer].   Review of Systems        See HPI - all other systems neg except as noted...      The patient complains of weight gain, dyspnea on exertion, and peripheral edema.  The patient denies anorexia, fever, weight loss, vision loss, decreased hearing, hoarseness, chest pain, syncope, prolonged cough, headaches, hemoptysis, abdominal pain, melena, hematochezia, severe  indigestion/heartburn, hematuria, incontinence, genital sores, suspicious skin lesions, transient blindness, difficulty walking, depression, unusual weight change, abnormal bleeding, enlarged lymph nodes, and angioedema.     Objective:   Physical Exam     WD, Overweight, 59y/o BF in NAD... GENERAL:  Alert & oriented; pleasant & cooperative. HEENT:  [place]/AT, EOM-wnl, PERRLA, EACs-clear, TMs-wnl, NOSE-clear, THROAT-clear & wnl. NECK:  Supple w/ fairROM; no JVD; normal carotid impulses w/o bruits; no thyromegaly or nodules palpated; no lymphadenopathy. CHEST:  Clear to P & A; without wheezes, rales or rhonchi... HEART:  Regular Rhythm; without murmurs/ rubs/ or gallops. ABDOMEN:  Soft & nontender; normal bowel sounds; no organomegaly or masses detected. EXT: without deformities, mild arthritic changes; no varicose veins but +venous insuffic & tr edema; +trigger points. NEURO:  CN's intact;  no focal neuro deficits... DERM:  No lesions noted; no rash etc...  RADIOLOGY DATA:  Reviewed in the EPIC EMR & discussed w/ the patient...  LABORATORY DATA:  Reviewed in the EPIC EMR & discussed w/ the patient...   Assessment & Plan:    RESP>  Hx AR, HxAsthma, old sarcoid, mild OSA>  All stable, breathing OK, notes some wheezing w/ activity; exam reveals some secretions in the airway & rec to take MUCINEX 1243m bid, Fluids, etc;. CXR remains stable/ NAD...   CARDS>  Followed by DrTTurner/ VRica Mote?on meds listed? SHE DID NOT BRING MEDS TO THE OV> Hx HBP, CAD, VI, etc;  Chest pain is non-cardiac & likely related to her FM; Doing satis & we reviewed exercise program etc...  CHOL>  [facility] + off Zet[facility]; ? Taking a [facility] drug [facility]  DM>  Followed by [name] on Metformin, Onglyzal; A1c improved to 6.7 in 2012, then 6.0 in 2013, & 6.5  In 2014;  rec continue meds & [place]  GI>  Stable on Protonix as needed; see GI eval from  [name] 7/15...  POLYMYOSITIS/ FM>  Diagnoses w/ Polymyositis recently (2016) & started on Pred, MTX, folate by DrBeekman...  Anxiety>  She remains on Alprazolam as needed; & she stopped the Lexapro..  Anemia, Low Ferritin> on FeSO4 3250m& VitC 500...   B12 Defic> on B12 shots per [name]...   Patient's Medications  New Prescriptions   ALBUTEROL (PROVENTIL HFA;VENTOLIN HFA) 108 (90 BASE) MCG/ACT INHALER    Inhale 1-2 puffs into the lungs every 6 (six) hours as needed for wheezing or shortness of breath.  Previous Medications   ALPRAZOLAM (XANAX) 0.5 MG TABLET    take 1/2 tablet by mouth IN THE MID-MORNING AND MID-AFTERNOON, and take 1 tablet once daily at bedtime if needed   ASPIRIN 81 MG TABLET    Take 81 mg by mouth daily.     CYANOCOBALAMIN 1000 MCG/ML KIT    Inject 1,000 mg as directed every 30 (thirty) days.   DILTIAZEM (CARDIZEM CD) 240 MG 24 HR CAPSULE    take 1 capsule by mouth once daily   FERROUS SULFATE 325 (65 FE) MG TABLET    Take 325 mg by mouth daily with breakfast. Iron supplement does contain vitamin C as well - pt uses OTC brand   FOLIC ACID (FOLVITE) 1 MG TABLET    Take 1 mg by mouth daily.   FUROSEMIDE (LASIX) 20 MG TABLET    Take 2 tablets by mouth every morning as needed   HYDROCORTISONE (ANUSOL-HC) 2.5 % RECTAL CREAM    Place 1 application rectally 4 (four) times daily.   LOSARTAN (COZAAR) 50 MG TABLET    Take 2 tablets (100 mg total) by mouth daily.   METHOTREXATE (RHEUMATREX) 2.5 MG TABLET    Take 12.5 mg by mouth once a week. Caution:Chemotherapy. Protect from light.                       <<PREDNISONE 20104mabs>>                <<  Take as diected by DrBeekman>>    METOPROLOL TARTRATE (LOPRESSOR) 25 MG TABLET    Take 1 tablet (25 mg total) by mouth 2 (two) times daily.   NITROGLYCERIN (NITROSTAT) 0.4 MG SL TABLET    Place 1 tablet (0.4 mg total) under the tongue every 5 (five) minutes as needed for chest pain.   ONDANSETRON (ZOFRAN) 4 MG TABLET    Take 1 tablet  (4 mg total) by mouth every 8 (eight) hours as needed for nausea or vomiting.   SAXAGLIPTIN-METFORMIN (KOMBIGLYZE XR) 03-999 MG TB24    Take 1 tablet by mouth once.   VALACYCLOVIR (VALTREX) 500 MG TABLET    Take 500 mg by mouth daily.   Modified Medications   Modified Medication Previous Medication   PANTOPRAZOLE (PROTONIX) 40 MG TABLET pantoprazole (PROTONIX) 40 MG tablet      Take 1 tablet (40 mg total) by mouth daily.    Take 1 tablet (40 mg total) by mouth daily.  Discontinued Medications   No medications on file

## 2015-10-10 ENCOUNTER — Other Ambulatory Visit: Payer: Self-pay | Admitting: Pulmonary Disease

## 2015-10-10 NOTE — Telephone Encounter (Signed)
Pt requesting refill on alprazolam . Last refilled 03/2015 #60 x 5 refills : take 1/2 tablet by mouth IN THE MID-MORNING AND MID-AFTERNOON, and take 1 tablet once daily at bedtime if needed  Please advise Dr. [name] thanks

## 2015-10-14 ENCOUNTER — Telehealth: Payer: Self-pay | Admitting: Pulmonary Disease

## 2015-10-14 MED ORDER — AZITHROMYCIN 250 MG PO TABS: ORAL_TABLET | ORAL | Status: DC

## 2015-10-14 NOTE — Telephone Encounter (Signed)
Pt was here 10/31 and was fine.  She is now very congested, chest tight, cough, chest rattling.  Patient is currently taking Prednisone 10mg daily.   [facility] [address]  Allergies  Allergen Reactions  . Actos [Pioglitazone]     REACTION: pt states INTOL w/ edema  . Codeine     REACTION: vomiting  . Januvia [Sitagliptin] Nausea Only  . Lactose Intolerance (Gi)   . Morphine Nausea Only    REACTION: vomiting  . Sitagliptin Phosphate     REACTION: nausea   Current Outpatient Prescriptions on File Prior to Visit  Medication Sig Dispense Refill  . albuterol (PROVENTIL HFA;VENTOLIN HFA) 108 (90 BASE) MCG/ACT inhaler Inhale 1-2 puffs into the lungs every 6 (six) hours as needed for wheezing or shortness of breath. 8 g 2  . ALPRAZolam (XANAX) 0.5 MG tablet take 1/2 tablet by mouth IN THE MID-MORNING AND MID-AFTERNOON, and take 1 tablet once daily at bedtime if needed 60 tablet 5  . aspirin 81 MG tablet Take 81 mg by mouth daily.      . Cyanocobalamin 1000 MCG/ML KIT Inject 1,000 mg as directed every 30 (thirty) days.    . diltiazem (CARDIZEM CD) 240 MG 24 hr capsule take 1 capsule by mouth once daily 90 capsule 2  . ferrous sulfate 325 (65 FE) MG tablet Take 325 mg by mouth daily with breakfast. Iron supplement does contain vitamin C as well - pt uses OTC brand    . folic acid (FOLVITE) 1 MG tablet Take 1 mg by mouth daily.    . furosemide (LASIX) 20 MG tablet Take 2 tablets by mouth every morning as needed    . hydrocortisone (ANUSOL-HC) 2.5 % rectal cream Place 1 application rectally 4 (four) times daily. (Patient not taking: Reported on 09/15/2015) 30 g 1  . losartan (COZAAR) 50 MG tablet Take 2 tablets (100 mg total) by mouth daily. 60 tablet 4  . methotrexate (RHEUMATREX) 2.5 MG tablet Take 12.5 mg by mouth once a week. Caution:Chemotherapy. Protect from light.    . metoprolol tartrate (LOPRESSOR) 25 MG tablet Take 1 tablet (25 mg total) by mouth 2 (two) times daily. 60 tablet 6  .  nitroGLYCERIN (NITROSTAT) 0.4 MG SL tablet Place 1 tablet (0.4 mg total) under the tongue every 5 (five) minutes as needed for chest pain. 25 tablet 5  . ondansetron (ZOFRAN) 4 MG tablet Take 1 tablet (4 mg total) by mouth every 8 (eight) hours as needed for nausea or vomiting. 20 tablet 1  . pantoprazole (PROTONIX) 40 MG tablet Take 1 tablet (40 mg total) by mouth daily. 180 tablet 1  . Saxagliptin-Metformin (KOMBIGLYZE XR) 03-999 MG TB24 Take 1 tablet by mouth once.    . valACYclovir (VALTREX) 500 MG tablet Take 500 mg by mouth daily.      No current facility-administered medications on file prior to visit.    

## 2015-10-14 NOTE — Telephone Encounter (Signed)
Per SN>> Call in [place] to pt's pharmacy and inform pt to OTC mucinex 600mg  and albuterol HFA to help alleviate symptoms. Inform pt to call office back if not feeling better  Pt called and notified of SN rec and medication instructions Pt voiced understanding of rec Orders sent electronically to pt's pharmacy  Nothing further is needed at this time

## 2015-10-20 ENCOUNTER — Encounter: Payer: Self-pay | Admitting: Pulmonary Disease

## 2015-10-20 ENCOUNTER — Ambulatory Visit (INDEPENDENT_AMBULATORY_CARE_PROVIDER_SITE_OTHER): Payer: BC Managed Care – PPO | Admitting: Pulmonary Disease

## 2015-10-20 VITALS — BP 142/84 | HR 99 | Temp 98.1°F | Wt 207.4 lb

## 2015-10-20 DIAGNOSIS — J209 Acute bronchitis, unspecified: Secondary | ICD-10-CM | POA: Diagnosis not present

## 2015-10-20 DIAGNOSIS — J069 Acute upper respiratory infection, unspecified: Secondary | ICD-10-CM

## 2015-10-20 DIAGNOSIS — G4733 Obstructive sleep apnea (adult) (pediatric): Secondary | ICD-10-CM | POA: Diagnosis not present

## 2015-10-20 LAB — HM DIABETES EYE EXAM

## 2015-10-20 MED ORDER — LEVOFLOXACIN 500 MG PO TABS: 500.0000 mg | ORAL_TABLET | Freq: Every day | ORAL | Status: DC

## 2015-10-20 MED ORDER — METHYLPREDNISOLONE ACETATE 80 MG/ML IJ SUSP: 80.0000 mg | Freq: Once | INTRAMUSCULAR | Status: DC

## 2015-10-20 NOTE — Patient Instructions (Signed)
Today we updated your med list in our EPIC system...    Continue your current medications the same...  Today we gave you a Depo shot & a sample of ADVAIR- one inhalation twice daily on a regular basis...  Continue to use the PROAIR-HFA rescue inhaler as needed but do this thru the SPACER as demonstarted in the office.  We are prescribing LEVAQUIN antibiotic- one tab daily til gone...    Be sure to take a probiotic like ALIGN one daily when you are on the antibiotics...  Finally- continue the [facility] 600mg  tabs- one tab 4 times daily followed by a glass of water    This should help loosen up the thick mucus (along w/ the hot tea w/ lemon & honey, plus the chicken soup)!  Rest at home and call for any questions.Marland KitchenMarland Kitchen

## 2015-10-20 NOTE — Telephone Encounter (Signed)
Per SN>> Ok to refill as previously rx'd  Order called into pt's pharmacy  Nothing further is needed

## 2015-10-20 NOTE — Progress Notes (Signed)
Subjective:    Patient ID: Jocelyn [name], female    DOB: [birth year], 59 y.o.   MRN: [id number]  HPI 59 y/o BF here for a follow up visit... she has multiple medical problems including:  AR & Asthma;  Hx Sarcoid;  Mild OSA;  HBP;  CAD;  Ven Insuffic;  Hyperchol;  DM;  GERD/ IBS;  Hx Kidney stones;  FM;  Vit D defic;  Anxiety... ~  SEE PREV EPIC NOTES FOR THE OLDER DATA >>    LABS 7/13 by [name]> FLP- at goals on Lip+Zetia;  Chems- wnl;  CBC- Hg=12.4, Ferritin low at 14.9, B12= 211; TSH=1.53;   December 04, 2012:  [name] has mult somatic complaints and they all seem to revolve around not resting well & aching/ sore/ tender in chest wall; we reviewed FM diagnosis which she has carried for yrs and prev saw [name]- Rec trial Lunesta 30mQhs & Tramadol Tid prn...      CXR 1/14 showed cardiomeg, clear lungs, no adenopathy, mild scoliosis..[name]   ~  February 13, 2014:  647moOV & [name] has mult somatic complaints- recent gastroenteritis (on Zofran & Immodium), c/o swelling all over "they don't know what it is" states she watches salt & exercises by walking 3-4d/wk... We reviewed the following medical problems during today's office visit >>     AR/ AB/ Hx Sarcoid> on Mucinex & MMW prn; denies cough, sputum, hemoptysis, or ch in dyspnea- she has SOB/DOE but is too sedentary & needs to incr exercise program; some CWP from her FM & we discussed FM rx below...    HBP/ CAD> on ASA81, Toprol50-1/2Bid, Cardizem240, Lasix20prn (ave 1/d) & ?Amlodip5 from [employer] BP=124/76 & she has mult somatic c/o- CWP, tired/no energy, but denies palpit, ch in SOB, edema, etc; followed by [name]- [name], no recent notes.    CHOL> off Lipitor => notes from [employer] ?Lip80 +[facility] study drug? (both stopped due to elev CPK); she has gained 8# to 198# today; [facility] & she reports concern for "particle size" & last avail FLP was 7/14 by [name] w/ TChol 190, HDL 100, LDL 40...    DM> followed by  [name] on Metform500Bid & Onglyza5 => ?he changed to Kombiglyze5-1000 daily?; last labs from [name]/14 showed BS=105, A1c=6.5; and she remains on the same meds...    GI> GERD, IBS> on Protonix40; denies abd pain, n/v, d/c, or blood seen; last colonoscopy was 7/03 by [name] and reported wnl; f/u due now esp in light of her low Ferritin level...    FM> This is her CC w/ not resting well, wakes tired, poor energy, aching/ sore/ tender (esp in chest in AM) & we discussed the importance of a good night's rest- states she's INTOL Ambien w/ weird dreams & Benedryl/ Melatonin w/o help...    Anxiety> on Xanax0.8m37mrn, she was INTOL to Zoloft & she stopped prev Lexapro Rx...    Vit D defic> she tells me that [name] treated her low VitD w/ 50K weekly, then switched to daily OTC supplement...    Vit B12 defic> [name] has her on B12 shots (weekly x4, then monthly) for B12 level done 8/14 = 154...    Borderline anemia & low Ferritin> pt reports that labs from DrKRound Topowed this & he wanted us [name] eval & treat; Labs 9/14 showed Hg= 12.5, MCV=88, Fe=47 (12%sat), Ferritin=10.8; she is due for f/u colon w/ [name] & we will refer, rec to start  FeSO4 354m Bid w/ VitC500 w/ plans for f/u CBC- done 4/15 w/ Hg=12.5, Fe=31 (8%sat) but only taking Iron once daily; rec to increase to Bid... We reviewed prob list, meds, xrays and labs> see below for updates >>   CXR 4/15 showed mild cardiomeg, clear lungs, NAD...  LABS 4/15:  Chems- wnl;  CBC- ok w/ Hg=12.5 but Fe=31 (8%);  TSH=1.62;  ACE=56 (8-52);  BNP=26...  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  In the interval she has lost 1#,  BP= 134/80, & she has started FeSO4  1-2/d & GI appt w/ [name] is still pending...  She notes some constip on the Fe and we will rec Ferrosequels or similar product + laxatives prn...  ~  September 09, 2014:  21m51moV & here for pulm follow up>  She has hx old Sarcoid in 1980's, no activity x yrs; CXR 4/15 stable, NAD;  She tells me that her daughter noted her to be wheezing this past summer, usually w/ activ but sometimes on the phone; she had recent URI, toook ZPak & improved;  Exam shows scat rhonchi and we discussed need for MUCINEX 1200m44m, Fluids, etc;  PFT today is ok ?min restriction, no signif obstructive dis...    [name]> seen 9/15 w/ hx HBP, mild CAD in branch vessel, unable to exercise due to knees, c/o SOB but no CP; he increased her Losartan to 100mg35m Currently taking ASA81, Metoprolol25Bid, DiltiazemCD240, Losartan50-2/d, Lasix20-2Qam; BP= 138/80 & she denies CP, palpit, ch in SOB, edema, etc...     Lipid status is complex> she was on Liptor but this was stopped due to elev musc enzymes; then she entered drug study thru [facility] but apparently musc enz were elevated there too & med was stopped after a short time, subseq serial musc enz remained elev according to [name]; he challenged her w/ Lip10 & had to stop that too- he has referred her to Rheum but she hasn't gone yet; I have encouraged her to pursue Rheum consultation in light of elev musc enz off all statin & chol meds; she denies weakness but had some leg pain on statin...     She tried Belviq per [name], ok'd by [name]- but she tells me that it made her "feel funny" therefore she stopped it after just 2wks; wt is up 5# to 202# in our office today...    She continues regular f/u w/ [name] for Endocrine> DM & B12 defic;  last note in Epic is 4/15 w/ BS=85, A1c=6.1 on Kombiglyze 03-999 daily and B12 shots 1000mcg37mthly...     She had GI f/u [name] 5/15> iron defic anemia, IBS, constip from Fe; she did EGD 7/15 showing gastric polyp in  cardia (path=hyperplastic); and Colonoscopy 7/15 showing 2 sessile polyps in sigmoid (path=tubular adenoma) & rec to take Protonix40 & hi fiberr diet. We reviewed prob list, meds, xrays and labs> see below for updates >>   CXR 4/15 showed mild cardiomeg, clear lungs, NAD...  EKMarland KitchenMarland Kitchen9/15 showed NSR, rate80, 1st degree AVB, otherw norm EKG...  2DEcho 9/15 showed mild LVH, norm LVF w/ EF=60-65%, norm wall motion, Gr1DD,  norm valves w/ trivMR...   PFT 10/15 showed FVC=1.93 (79%), FEV1=1.55 (79%), %1sec=80%, mid-flows=70% predicted...   ~  March 13, 2015:  21moROV & Malorie indicates that she's "hanging in there"; today c/o 1wk hx increased congestion w/ cough/ yellow-green sput, +f/c/s, SOB & wheezing she says; she started on OTC MUCINEX and took a ZPak she had on hand but only min better=> we decided to treat w/ Levaquin, Medrol dosepak, Mucinex & fluids...  She has had numerous subspecialty follow up visits over the last 644mo      She continues f/u w/ [name] for Cards> seen 3/16 for mild CAD in a branch vessel, exercise lim by knee arthritis, DOE w/ exertion, exam was neg; rec to continue ASA81, Metop25Bid, CardizemCD240, Losar100, Lasix40; she is off Lipitor & CoQ10; she is concerned about just following her abn CPK values...      She continues to f/u w/ [name] for Endocrine> seen 10/15 for DM (A1c=6.3, Umicroalb=neg), left adrenal adenoma (12x1410m stable, no change over time), B12 defic (level now>1500 on shots); on ASA81, KombiglyzeXR5-1000 one daily, B12 shots monthly;  She has also been evaluated by [facility], [facility]ba[facility] "to learn how to eat healthy & lose weight"; she tries to exercise by [employer] (low impact)...      She had GI f/u w/ AE for [name]> she had EGD & Colon 7/15 (as below); presented c/o ext hem- Rx w/ sitz, baby wipes, benefiberAnusolHC...      Rheum- [name] (seen 11/15- note reviewed), Ortho- [name]> bilat knee pain, XRays showed arthritis,  prev given shot & knee brace; Rheum thought her elev CPK was an "isolated elevated CPK" as her strength was 5/5, didn't have polymyositis, norm AST/ALT, etc; they agreed w/ the Dx of fibromyalgia & he rec Flexeril & Alprazolam... The CPK issue appears complicated by [facility] trial she enrolled in;  She is concerned due to other somatic complaints including right foot numb, & mult falls "I'm clumsy & fall for no reason" => refer to Neuro for their eval...      We reviewed prob list, meds, xrays and labs> see below for updates >>   CXR showed mild cardiomeg & sl tortuous Ao, clear lungs, DDD of Tspine, NAD... IMP/ PLAN>>  She has a refractory URI/ bronchitis & we decided to treat w/ Levaquin500m31mx7d, Align daily, and Medrol dosepak; she will continue her Mucinex600-2Bid + fluids;  She is still very concerned about the elev CPKs and not at all satisfied by [name]'s plan to just watch it, esp since it is rising she says; she has seen Rheum and no answer forthcoming so she would like to see Neurology for their opinion & consultation=> [facility] ~  September 15, 2015:  21mo 58mo& Aubrianne saw [name] in Neuro- felt to have a myopathy and labs showed elev CPK (as hi as 1860, and a pos PM-Scl 100 antibody;  Clinically she had musc aches and some falling episodes, some numbness & tingling, no skin changes;  EMG/NCV was performed (most consistent with a non-necrotizing myopathy affecting the proximal leg muscles)- [facility] who felt she has polymyositis or a PM/scleroderma overlap syndrome, he wanted to get a musc bx but decided to start Rx rather than wait & she is now on Pred & MTX w/ Folic... [name] manages her Endocrine system- DM, adrenal adenoma, B12 defic, VitD defic,  Hx low Fe (seen last 03/2015 w/ A1c=6.3, Hg=12.8, Ferritin =19, B12=551, VitD=20... Since she has started on the Pred & MTX her CPK & aldolase enzymes have  normalized & she has not fallen...     AR/ AB/ Hx Sarcoid> on Mucinex & MMW prn; denies cough, sputum, hemoptysis, or ch in dyspnea- she has SOB/DOE but is too sedentary & needs to incr exercise program; she requests a rescue inhaler for occas wheezing she encounters=> ProairHFA written for prn use...     HBP/ CAD> on ASA81, Toprol50-1/2Bid, Cardizem240, Cozaar50-2/d, Lasix20-2/d; BP=136/82 & she notes some chest discomfort & edema, denies palpit/ ch in SOB etc ; followed by [facility]- [name], no recent notes.    CHOL> off Lipitor w/ hx elev CPK (on diet alone); FLP is followed by [facility] per pt & she reports concern for "particle size" & last avail FLP was 3/16 showed TChol 181, TG 162, HDL 44, LDL 104    DM> followed by [name] on Kombiglyze => [name] follows her labs and his notes are reviewed...    GI> GERD, IBS> on Protonix40; denies abd pain, n/v, d/c, or blood seen; last colonoscopy was 7/03 by [name] and reported wnl; f/u due now esp in light of her low Ferritin level...    Polymyositis, HxFM> SEE ABOVE and notes from [name] & DrBeekman now on Pred, MTX, & Folic...     Anxiety> on Xanax0.70m Prn, she was INTOL to Zoloft & she stopped prev Lexapro Rx...    Vit D defic> she tells me that [name] treated her low VitD w/ 50K weekly, then switched to daily OTC supplement...    Vit B12 defic> [name] has her on B12 shots (weekly x4, then monthly) for B12 level done 8/14 = 154...    Borderline anemia & low Ferritin> she was treated w/ Fe from [name] & improved... EXAM shows Afeb, VSS, O2sat=96% on RA;  HEENT- neg, mallampati2;  Chest- decr BS but clear w/o w/r/r;  Heart- RR gr1/6 SEM w/o r/g;  Abd- soft, neg;  Ext- VI, tr edema... IMP/PLAN>>  DMarleenahas been diagnosed w/ polymyositis & started on Pred+MTX by DGlean Salen  She will continue Rx from him, add a Probiotic daily to aide digestion & we wrote for a rescue inhaler per her request...   ~  October 20, 2015:  135moOV & add-on appt  requested for persistent URI symptoms>  Cough, congestion, wheezing, some yellow mucus, assoc w/ chills & sweats (no fever reported)- "I feel awful", and she's already on Pred from Rheum for her PM/scleroderma overlap syndrome; ZPak called in & no better she says, has Proventil-HFA for prn use;  See prob list above...    EXAM shows Afeb, VSS, O2sat=96% on RA;  HEENT- neg, mallampati3;  Chest- decr BS & few bibasilar rhonchi w/ end-exp wheezing;  Heart- RR gr1/6 SEM w/o r/g;  Abd- soft, neg;  Ext- VI, tr edema. IMP/PLAN>>  We don't want to have to incr Pred- given Depo80, adding Levaquin500 x7d, ADVAIR100Bid, Mucinex600-2Bid + fluids...         Problems List:  MENIERE'S DISEASE (ICD-3 - eval by ENT, DrBates- Rx w/ diuretic, low salt, Xanax vs Valium, & Antivert Prn... dizziness recurred & [facility] we don't have notes but pt reports that nothing was found.  ALLERGIC RHINITIS - increased allergy symptoms in the spring... rec> Claritin, Saline, Flonase...  Hx of ASTHMATIC BRONCHITIS, ACUTE  - requires occas Antibiotics and Medrol for infectious exac-  last Oct09 & resolved w/ Avelox/ Depo/ Medrol... has not been on regular inhalers, but uses XOPENEX MDI (w/ spacer), [place] Prn... ~  essentially neg CTChest 9/08 after CXR suggested RULnodule. ~  CXR 10/09 showed cardiomegaly, clear lungs, min scoliosis, NAD. ~  CXR 9/11 showed cardiomeg, clear lungs, NAD.[name]. ~  CXR 1/14 showed cardiomeg, clear lungs, no adenopathy, mild scoliosis... ~  CXR 4/15 showed mild cardiomeg, clear lungs, NAD... ~  PFT 10/15 showed FVC=1.93 (79%), FEV1=1.55 (79%), %1sec=80%, mid-flows=70% predicted...  ~  CXR showed mild cardiomeg & sl tortuous Ao, clear lungs, DDD of Tspine, NAD... ~  10/16:  We wrote for an AlbutHFA inhaler for prn use at her request for wheezing... ~  12/16: presents w/ refractory AB- given Levaquin, already on Pred per Rheum, added Advair100Bid + Mucinex600-2Bid...  Hx of  SARCOIDOSIS  - Dx'd 1980's w/ bronch bx... Rx Pred transiently and improved... no active disease x years... ~  baseline CXRs showed cardiomegaly, clear lungs, min scoliosis, NAD.  Hx of Mild OBSTRUCTIVE SLEEP APNEA - sleep eval DrClance 2006 w/ RDI= 5 only.   FOLLOWED by [facility] FOR [facility]ARDIOLOGY >>   HYPERTENSION, BENIGN  >> followeed by [name] now for CARDS...  ~  on TOPROL XL 86m- 1.5 tabs daily, CARDIZEM 2458md, LASIX 2032must as needed now... ~  prev OV's 130-140's / 80's... prev noted sl HA, chest tightness, sl SOB, & mult somatic complaints... Labs & renal- all WNL. ~  4/12:  BP=122/76 today, and similar at home per pt... She denies CP, palpit, SOB, edema, etc... Labs & renal- all WNL. ~  1/14: on ASA81, Imdur120, ToprolXL75, Cardizem240, Lasix20prn (seldom takes); BP=128/80 & she has mult somatic c/o- CWP, tired/no energy, but denies palpit, ch in SOB, edema, etc; followed by [facility]- DrTurner/ [name] & seen recently but we don't have note; they prev added RANEXA 500m19md for her CP (now off this med); we discussed trial Lunesta 2mgQ10mfor sleep & Tramadol 50mgT45mor pain... ~  9/14:  on ASA81, Imdur120, Toprol50-1/2Bid, Cardizem240, Lasix20prn (seldom takes); BP=132/74 & she has mult somatic c/o- CWP, tired/no energy, but denies palpit, ch in SOB, edema, etc; followed by [facility]- DrTurner/ [name], no recent notes ~  4/15: on ASA81, Toprol50-1/2Bid, Cardizem240, Lasix20prn (ave 1/d) & ?Amlodip5 from [facility]?[employer]/76 & she has mult somatic c/o- CWP, tired/no energy, but denies palpit, ch in SOB, edema, etc; followed by [facility]- [name], no recent notes. ~  4/16: on ASA81, Metop25Bid, CardizemCD240, Losar100, Lasix40; BP= 146/88 and reminded to elim sodium & get weight down...  CORONARY ARTERY DISEASE - on ASA 81mg/d39mDUR 120mg/d,65mrol XL, Cardizem, Lasix as above... Followed by DrTTurner. ~  Cath in 2000 showed non-obstructive CAD (20-30% lesions in all 3  vessels) w/ rec for risk factor modification.   ~  NuclearStressTest 4/07 showed no ischemia & EF=73%. ~  Mar10:  she's concerned about BP and chest tightness, requests Cardiac eval DrTurner & we will refer... ~  2DEcho 4/10 showed norm LVF w/ EF=55-60%, norm MV, norm AoV, Gr1DD ~  4/10 eval by DrTurner w/ MYOVIEW- neg- breast attenuation, no regional wall motion abn, EF= 75% ~  4/10 Cath by DrTurner w/ 90% small 2nd diag branch of LAD <too sm for PTCA> & luminal irreg up to 20% in RCA, +DD- Med Rx. ~  She had more chest tightness & another Myoview from DrTurner 8/12- it too was neg, no ischemia, no infarct; +breast attenuation; EF=70% & nno  regional wall motion abnormalities;  IMDUR has been incr to 176m/d. ~  1/14: she tells me [facility] have [facility] from [facility]> EKG sent showed NSR, rate72, wnl, NAD... ~  EKG 5/14 showed NSR, rate 93, WNL, NAD... ~  12/14: she saw [name] for Cards f/u (note reviewed)> HBP, CAD, Chol; off Imdur w/o angina, continue same meds including both Cardizem & Amlodipine, encouraged to continue [employer] study... ~  She continues regular f/u visits w/ [name], CARDS> his notes are reviewed...  VENOUS INSUFFICIENCY - [facility] DrWWoods and seen by DrSevier 6/08= ven insuffic Rx w/ TED's, no salt, elevation, etc. ~  10/10: notes bilat ankle ?lipoma in front of the lat malleoli- asymptomatic but several people have noticed them and she request refer to Ortho (rec DrBednarz when she is ready).  HYPERCHOLESTEROLEMIA  - on LIPITOR 432md & ZETIA 1062m + CoQ10 per DrTurner... Diet & exercise stressed to the pt. ~  FLP here 12/07 on Lip10 showed TChol 156, TG 94, HDL 51, LDL 86 ~  FLP 1/09 on Lip10 showed TChol 157, TG 122, HDL 49, LDL 84 ~  [facility] showed TChol 176, TG 116, HDL 55, LDL 98 ~  FLP 3/10 on Lip10 showed TChol 170, TG 94, HDL 59, LDL 93 ~  FLP 6/10 by DrTurner on Lip20  showed TChol 134, TG 82, HDL 55, LDL 73 ~  FLP 4/11 here on Lip20+Zetia? showed TChol 145, TG 74, HDL 68, LDL 63 ~  9/11:  she reports that DrTurner incr Lipitor to 9m18mlus the Zetia 10mg29md she does her FLPs. ~  FLP here 4/12 on Lip40+Zetia showed TChol 153, TG 54, HDL 53, LDL 90... Copy sent to DrTurner ~  FLP [place] 7/13 on Lip40+Zetia10 showed TChol 137, TG 89, HDL 42, LDL 73 ~  9/14: on Lip40 => notes from [facility] ?Lip80 +[facility] study drug?; she has gained 4# to 190# today; [facility] & she reports concern for "particle size" & last avail FLP was 7/14 by [name] w/ TChol 190, HDL 100, LDL 40 ~  FLP followed by [name] for CARDS, and [name] for Endocrine...   FOLLOWED by [name] FOR ENDOCRINOLOGY >>   DIABETES MELLITUS  - on Metformin 500mgB19m PARLODEL 2.5mg/d 34m DrEllison... She is intol to Actos w/ edema, she stopped Januvia due to ?side effect, she wondered about Tajenta since her daugh works for [employer] 9/08 showed FBS=119, HgA1c=6.6 ~  [facility]= 108, HgA1c= 6.5 ~  labs 3/10 showed BS= 113, A1c= 6.6 ~  labs 6/10 by DrTurner showed BS= 92; and 3/11 BS= 127 ~  labs 4/11 showed BS= 124, A1c= 7.6... rec>Marland Kitchenincr Metform to Bid; she [facility]... ~  6/11:  pt requested Endocrine consult & seen by DrEllison w/ Januvia 100mg/d 94md. ~  labs 9/11 showed BS= 98, A1c= 7.3... she sMarland KitchenMarland Kitchenpped Januvia due to side effects & wonders about using Tajenta instead. ~  DrEllison started [employer]...84me is now taking this +Metform 500mgBid..96m Labs 4/12 (wt=195#) showed Bs= 116, A1c= 7.1 ~  Labs 7/12 showed A1c= 7.2;  Umicroalb= 3.5 ~  Labs 10/12 (wt=186#) showed A1c= 6.7 NOTE: She participated in a [facility] trial called the AfricanAmerican- Diabetes Heart Study in 2012; they sent a report indicating: 1) her  memory was fine, but her MRI Brain showed sm vessel dis & mild sinus dis, otherw neg; 2)  she may have treatable depression but we tried her on Zoloft & she was intol==> ch to Lexapro. ~  followed by [name] on Metform500Bid & Onglyza2.5; labs from [place] 9/13 showed BS=201, A1c=6.0; he rec same meds... ~  9/14: followed by [name] on Metform500Bid & Onglyza2.5 => ?he changed to Kombiglyze5-1000 daily?; last labs from [place] 7/14 showed BS=105, A1c=6.5; and she remains on the same meds... ~  1/15: she had f/u [name] for Endocrine> DM, Obesity, Adrenal adenoma, VitB12 defic; labs reviewed, note reviewed- he tried Belviq but she was intol... ~  She continues regular follow up w/ [name]...   FOLLOWED by [name] for GASTORENTEROLOGY >>   GERD SYMPTOMS - pt placed on PROTONIX 39m/d w/ improvement in reflux symptoms...    ADDENDUM>> note from [name] indicates> upper endoscopy in July 1994 showed antral gastritis. 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  IRRITABLE BOWEL SYNDROME  - colonoscopy 7/03 by [name] was WNL. ~  9/14: she was referred to GI for f/u colon but never did it... ~  4/15: we will refer her to GI again...  RENAL CALCULUS  - sm stone seen in L kidney on CTScan... [facility]  POLYMYOSITIS >>  FIBROMYALGIA  - prev Rx by [name] in 2006... ~  1/14: This is her CC w/ not resting well, wakes tired, poor energy, aching/ sore/ tender (esp in chest in AM) & we discussed trial Lunesta 292mhs, & Tramadol 5026md; plus rest, heat, etc... States she's INTOL Ambien w/ weird dreams & Benedryl/ Melatonin w/o help... ~  11/14: she went to the ER w/ LBP> felt to be a lumbar strain... ~  She was seen by [name] for Rheum [id number]  ~  2016> DenLangley Gaussw [name] in Neuro- felt to have a myopathy and labs showed elev CPK (as hi as 1860, and a pos PM-Scl 100 antibody;  Clinically she had musc aches and some falling episodes, some numbness & tingling, no skin changes;  EMG/NCV was performed (most consistent  with a non-necrotizing myopathy affecting the proximal leg muscles)- she was [facility] who felt she has polymyositis or a PM/scleroderma overlap syndrome, he wanted to get a musc bx but decided to start Rx rather than wait & she is now on Pred & MTX w/ Folic  VITAMIN D DEFICIENCY - [facility]... supplemented w/ OTC Vit D 1000 u daily. ~  Vit D level was lower from [name] & he treated w/ Vit D 50K weekly for awhile, then switched to OTC supplement...  ANXIETY - on ALPRAZOLAM 0.5mg66mn... DEPRESSION - prev on Lexapro 20mg68mut she stopped on her own ~10/13 ?cost issues? ~  4/12: c/o feeling sad & anxious all the time- weak, not resting well, under a lot of stress; rec to seek counselling thru her local church or let us reKorear to [facility] w/ Zoloft 50==> but she was INTOL... ~  2013: switched to Lexapro & seems to be doing better but didn't stick w/ it due to cost issues...  ANEMIA & LOW FERRITIN LEVEL >>  B12 DEFICIENCY >> treated by [name] w/ B12 shots ~  9/14: she tells me that Ferritin is low & [name] tried her on Oral Fe prep w/o improvement & wants me to f/u and treat this problem; we  discussed need to recheck labs here- LABS 9/14 showed Hg=12.5, MCV=88, Fe=47 (12%sat), Ferritin=10.8; she is due for f/u colon w/ [name] & we will refer, rec to start FeSO4 323m Bid w/ VitC500 w/ plans for f/u CBC, Fe studies in 2-374mo~  4/15:  Labs showed Hg= 12.5 but Fe=31 (8%); she was only taking the Fe once daily 7 advised to incr to Bid w/ vitC500; needs f/u colon & we will refer to [name] again...  HEALTH MAINTENANCE: ~  GI:  Followed by [name] & colon due 7/13... ~  GYN:  Followed by DrDillard & she gets yearly PAP, Mammogram, hasn't had baseline BMD yet, [facility]... ~  Immunizations:  She gets the yearly Flu shots at school;  Had PNEUMOVAX previously; TDAP given 4/12...   No past surgical history on file -  other than Lithotripsy for renal stone...   Outpatient Encounter Prescriptions as of 10/20/2015  Medication Sig  . albuterol (PROVENTIL HFA;VENTOLIN HFA) 108 (90 BASE) MCG/ACT inhaler Inhale 1-2 puffs into the lungs every 6 (six) hours as needed for wheezing or shortness of breath.  . ALPRAZolam (XANAX) 0.5 MG tablet take 1/2 tablet by mouth IN THE MID-MORNING AND MID-AFTERNOON, and take 1 tablet once daily at bedtime if needed  . aspirin 81 MG tablet Take 81 mg by mouth daily.    . Cyanocobalamin 1000 MCG/ML KIT Inject 1,000 mg as directed every 30 (thirty) days.  . Marland Kitcheniltiazem (CARDIZEM CD) 240 MG 24 hr capsule take 1 capsule by mouth once daily  . ferrous sulfate 325 (65 FE) MG tablet Take 325 mg by mouth daily with breakfast. Iron supplement does contain vitamin C as well - pt uses OTC brand  . folic acid (FOLVITE) 1 MG tablet Take 1 mg by mouth daily.  . furosemide (LASIX) 20 MG tablet Take 2 tablets by mouth every morning as needed  . losartan (COZAAR) 50 MG tablet Take 2 tablets (100 mg total) by mouth daily.  . methotrexate (RHEUMATREX) 2.5 MG tablet Take 12.5 mg by mouth once a week. Caution:Chemotherapy. Protect from light.   PREDNISONE 1059mablets               << Take one tab daily per DrBeekman >>   . metoprolol tartrate (LOPRESSOR) 25 MG tablet Take 1 tablet (25 mg total) by mouth 2 (two) times daily.  . nitroGLYCERIN (NITROSTAT) 0.4 MG SL tablet Place 1 tablet (0.4 mg total) under the tongue every 5 (five) minutes as needed for chest pain.  . oMarland Kitchendansetron (ZOFRAN) 4 MG tablet Take 1 tablet (4 mg total) by mouth every 8 (eight) hours as needed for nausea or vomiting.  . pantoprazole (PROTONIX) 40 MG tablet Take 1 tablet (40 mg total) by mouth daily.  . Saxagliptin-Metformin (KOMBIGLYZE XR) 03-999 MG TB24 Take 1 tablet by mouth once.  . valACYclovir (VALTREX) 500 MG tablet Take 500 mg by mouth daily.   . hydrocortisone (ANUSOL-HC) 2.5 % rectal cream Place 1 application rectally 4  (four) times daily. (Patient not taking: Reported on 10/20/2015)  . [DISCONTINUED] azithromycin (ZITHROMAX Z-PAK) 250 MG tablet Use as directed (Patient not taking: Reported on 10/20/2015)    Allergies  Allergen Reactions  . Actos [Pioglitazone]     REACTION: pt states INTOL w/ edema  . Codeine     REACTION: vomiting  . Januvia [Sitagliptin] Nausea Only  . Lactose Intolerance (Gi)   . Morphine Nausea Only    REACTION: vomiting  . Sitagliptin Phosphate  REACTION: nausea    Current Medications, Allergies, Past Medical History, Past Surgical History, Family History, and Social History were reviewed in [employer] record.   Review of Systems        See HPI - all other systems neg except as noted...      The patient complains of weight gain, dyspnea on exertion, and peripheral edema.  The patient denies anorexia, fever, weight loss, vision loss, decreased hearing, hoarseness, chest pain, syncope, prolonged cough, headaches, hemoptysis, abdominal pain, melena, hematochezia, severe indigestion/heartburn, hematuria, incontinence, genital sores, suspicious skin lesions, transient blindness, difficulty walking, depression, unusual weight change, abnormal bleeding, enlarged lymph nodes, and angioedema.     Objective:   Physical Exam     WD, Overweight, 59 y/o BF in NAD... GENERAL:  Alert & oriented; pleasant & cooperative. HEENT:  [place]/AT, EOM-wnl, PERRLA, EACs-clear, TMs-wnl, NOSE-clear, THROAT-clear & wnl. NECK:  Supple w/ fairROM; no JVD; normal carotid impulses w/o bruits; no thyromegaly or nodules palpated; no lymphadenopathy. CHEST:  Clear to P & A; without wheezes, rales or rhonchi... HEART:  Regular Rhythm; without murmurs/ rubs/ or gallops. ABDOMEN:  Soft & nontender; normal bowel sounds; no organomegaly or masses detected. EXT: without deformities, mild arthritic changes; no varicose veins but +venous insuffic & tr edema; +trigger points. NEURO:  CN's  intact;  no focal neuro deficits... DERM:  No lesions noted; no rash etc...  RADIOLOGY DATA:  Reviewed in the EPIC EMR & discussed w/ the patient...  LABORATORY DATA:  Reviewed in the EPIC EMR & discussed w/ the patient...   Assessment & Plan:    RESP>  Hx AR, HxAsthma, old sarcoid, mild OSA>  All stable, breathing OK, notes some wheezing w/ activity; exam reveals some secretions in the airway & rec to take MUCINEX 1215m bid, Fluids, etc;. CXR remains stable/ NAD... 10/20/15>  Presented w/ refractory AB- already on Pred- given Depo80, adding Levaquin500 x7d, ADVAIR100Bid, Mucinex600-2Bid + fluids...   CARDS>  Followed by DrTTurner/ VRica Mote?on meds listed? SHE DID NOT BRING MEDS TO THE OV> Hx HBP, CAD, VI, etc;  Chest pain is non-cardiac & likely related to her FM; Doing satis & we reviewed exercise program etc...  [facility]>  [facility]; ? [place]  DM>  Followed by [name] on Metformin, Onglyzal; A1c improved to 6.7 in 2012, then 6.0 in 2013, & 6.5  In 2014;  rec continue meds & [place]  GI>  Stable on Protonix as needed; see GI eval from [name] 7/15...  POLYMYOSITIS/ FM>  Diagnoses w/ Polymyositis recently (2016) & started on Pred, MTX, folate by DrBeekman...  Anxiety>  She remains on Alprazolam as needed; & she stopped the Lexapro..  Anemia, Low Ferritin> on FeSO4 3279m& VitC 500...   B12 Defic> on B12 shots per [name]...   Patient's Medications  New Prescriptions   LEVOFLOXACIN (LEVAQUIN) 500 MG TABLET    Take 1 tablet (500 mg total) by mouth daily.  Previous Medications   ALBUTEROL (PROVENTIL HFA;VENTOLIN HFA) 108 (90 BASE) MCG/ACT INHALER    Inhale 1-2 puffs into the lungs every 6 (six) hours as needed for wheezing or shortness of breath.   ALPRAZOLAM (XANAX) 0.5 MG TABLET    take 1/2 tablet by mouth IN THE MID-MORNING AND MID-AFTERNOON, and take 1 tablet once daily at  bedtime if needed   ASPIRIN 81 MG TABLET  Take 81 mg by mouth daily.     CYANOCOBALAMIN 1000 MCG/ML KIT    Inject 1,000 mg as directed every 30 (thirty) days.   DILTIAZEM (CARDIZEM CD) 240 MG 24 HR CAPSULE    take 1 capsule by mouth once daily   FERROUS SULFATE 325 (65 FE) MG TABLET    Take 325 mg by mouth daily with breakfast. Iron supplement does contain vitamin C as well - pt uses OTC brand   FOLIC ACID (FOLVITE) 1 MG TABLET    Take 1 mg by mouth daily.   FUROSEMIDE (LASIX) 20 MG TABLET    Take 2 tablets by mouth every morning as needed   HYDROCORTISONE (ANUSOL-HC) 2.5 % RECTAL CREAM    Place 1 application rectally 4 (four) times daily.   LOSARTAN (COZAAR) 50 MG TABLET    Take 2 tablets (100 mg total) by mouth daily.   METHOTREXATE (RHEUMATREX) 2.5 MG TABLET    Take 12.5 mg by mouth once a week. Caution:Chemotherapy. Protect from light.   METOPROLOL TARTRATE (LOPRESSOR) 25 MG TABLET    Take 1 tablet (25 mg total) by mouth 2 (two) times daily.   NITROGLYCERIN (NITROSTAT) 0.4 MG SL TABLET    Place 1 tablet (0.4 mg total) under the tongue every 5 (five) minutes as needed for chest pain.   ONDANSETRON (ZOFRAN) 4 MG TABLET    Take 1 tablet (4 mg total) by mouth every 8 (eight) hours as needed for nausea or vomiting.   PANTOPRAZOLE (PROTONIX) 40 MG TABLET    Take 1 tablet (40 mg total) by mouth daily.   SAXAGLIPTIN-METFORMIN (KOMBIGLYZE XR) 03-999 MG TB24    Take 1 tablet by mouth once.   VALACYCLOVIR (VALTREX) 500 MG TABLET    Take 500 mg by mouth daily.   Modified Medications   No medications on file  Discontinued Medications   AZITHROMYCIN (ZITHROMAX Z-PAK) 250 MG TABLET    Use as directed

## 2015-11-11 ENCOUNTER — Other Ambulatory Visit: Payer: Self-pay

## 2015-11-11 MED ORDER — LOSARTAN POTASSIUM 50 MG PO TABS: 100.0000 mg | ORAL_TABLET | Freq: Every day | ORAL | Status: DC

## 2015-11-27 ENCOUNTER — Encounter: Payer: Self-pay | Admitting: Pulmonary Disease

## 2015-12-09 ENCOUNTER — Other Ambulatory Visit: Payer: Self-pay | Admitting: Interventional Cardiology

## 2015-12-09 NOTE — Telephone Encounter (Signed)
Jettie Booze, MD at 01/29/2015 4:48 PM  metoprolol tartrate (LOPRESSOR) 25 MG tabletTake 1 tablet (25 mg total) by mouth 2 (two) times da Coronary atherosclerosis of native coronary artery  Continue Lopressor Tablet, 25 MG, 1 tablet, Orally, 1 tablet in am and 1 tablet in PM Notes: Branch vessel disease. She is off of Ranexa. No angina. Restart Imdur if CP returns. Hold off Imdur at this time. She had headaches in the past with this medicine. CP now is atypical , not related to exertion,and does not sound cardiac

## 2016-01-09 ENCOUNTER — Other Ambulatory Visit: Payer: Self-pay | Admitting: Interventional Cardiology

## 2016-01-09 ENCOUNTER — Other Ambulatory Visit: Payer: Self-pay

## 2016-01-09 MED ORDER — DILTIAZEM HCL ER COATED BEADS 240 MG PO CP24: ORAL_CAPSULE | ORAL | Status: DC

## 2016-01-23 ENCOUNTER — Telehealth: Payer: Self-pay | Admitting: Pulmonary Disease

## 2016-01-23 MED ORDER — METHYLPREDNISOLONE 4 MG PO TBPK: ORAL_TABLET | ORAL | Status: DC

## 2016-01-23 MED ORDER — LEVOFLOXACIN 500 MG PO TABS: 500.0000 mg | ORAL_TABLET | Freq: Every day | ORAL | Status: DC

## 2016-01-23 NOTE — Telephone Encounter (Signed)
Patient returned call, CB [phone] or [phone]
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
## 2016-01-23 NOTE — Telephone Encounter (Signed)
Attempted to contact patient, no answer.  Mailbox full, could not leave message. Will call back.

## 2016-01-23 NOTE — Telephone Encounter (Signed)
Spoke with pt, aware of recs.  rx's sent to preferred pharmacy.  Nothing further needed.  

## 2016-01-23 NOTE — Telephone Encounter (Signed)
Pt returned call and can be reached @ same #.Stanley A Dalton ° °

## 2016-01-23 NOTE — Telephone Encounter (Signed)
Per SN:  Please give Levaquin 500mg  #7 1 tab QD. Medrol dose pak 4mg  - 6 day pack. OTC Mucinex 600mg  2 tabs PO BID. OTC Align 1 tab QD and drink plenty of fluids.   LMTCB for pt.

## 2016-02-02 ENCOUNTER — Ambulatory Visit (INDEPENDENT_AMBULATORY_CARE_PROVIDER_SITE_OTHER): Payer: BC Managed Care – PPO | Admitting: Pulmonary Disease

## 2016-02-02 ENCOUNTER — Other Ambulatory Visit (INDEPENDENT_AMBULATORY_CARE_PROVIDER_SITE_OTHER): Payer: BC Managed Care – PPO

## 2016-02-02 ENCOUNTER — Encounter: Payer: Self-pay | Admitting: Pulmonary Disease

## 2016-02-02 ENCOUNTER — Ambulatory Visit (INDEPENDENT_AMBULATORY_CARE_PROVIDER_SITE_OTHER)
Admission: RE | Admit: 2016-02-02 | Discharge: 2016-02-02 | Disposition: A | Discharge status: Home / Self Care | Payer: BC Managed Care – PPO | Source: Ambulatory Visit | Attending: Pulmonary Disease | Admitting: Pulmonary Disease

## 2016-02-02 VITALS — BP 150/92 | HR 82 | Temp 98.3°F | Ht 62.0 in | Wt 210.6 lb

## 2016-02-02 DIAGNOSIS — E119 Type 2 diabetes mellitus without complications: Secondary | ICD-10-CM

## 2016-02-02 DIAGNOSIS — J069 Acute upper respiratory infection, unspecified: Secondary | ICD-10-CM | POA: Diagnosis not present

## 2016-02-02 DIAGNOSIS — G4733 Obstructive sleep apnea (adult) (pediatric): Secondary | ICD-10-CM | POA: Diagnosis not present

## 2016-02-02 DIAGNOSIS — R609 Edema, unspecified: Secondary | ICD-10-CM

## 2016-02-02 DIAGNOSIS — I872 Venous insufficiency (chronic) (peripheral): Secondary | ICD-10-CM

## 2016-02-02 DIAGNOSIS — J209 Acute bronchitis, unspecified: Secondary | ICD-10-CM | POA: Diagnosis not present

## 2016-02-02 DIAGNOSIS — I2510 Atherosclerotic heart disease of native coronary artery without angina pectoris: Secondary | ICD-10-CM

## 2016-02-02 DIAGNOSIS — M3320 Polymyositis, organ involvement unspecified: Secondary | ICD-10-CM

## 2016-02-02 DIAGNOSIS — I10 Essential (primary) hypertension: Secondary | ICD-10-CM | POA: Diagnosis not present

## 2016-02-02 LAB — BASIC METABOLIC PANEL
BUN: 12 mg/dL (ref 6–23)
CALCIUM: 9.6 mg/dL (ref 8.4–10.5)
CO2: 32 meq/L (ref 19–32)
CREATININE: 0.62 mg/dL (ref 0.40–1.20)
Chloride: 103 mEq/L (ref 96–112)
GFR: 125.74 mL/min (ref >60.00)
GLUCOSE: 137 mg/dL — AB (ref 70–99)
Potassium: 4.4 mEq/L (ref 3.5–5.1)
SODIUM: 142 meq/L (ref 135–145)

## 2016-02-02 LAB — CBC WITH DIFFERENTIAL/PLATELET
BASOS ABS: 0 10*3/uL (ref 0.0–0.1)
Basophils Relative: 0.4 % (ref 0.0–3.0)
EOS ABS: 0.1 10*3/uL (ref 0.0–0.7)
Eosinophils Relative: 1.5 % (ref 0.0–5.0)
HCT: 37.3 % (ref 36.0–46.0)
Hemoglobin: 12.6 g/dL (ref 12.0–15.0)
LYMPHS ABS: 1.6 10*3/uL (ref 0.7–4.0)
Lymphocytes Relative: 18.7 % (ref 12.0–46.0)
MCHC: 33.7 g/dL (ref 30.0–36.0)
MCV: 93.8 fl (ref 78.0–100.0)
Monocytes Absolute: 0.7 10*3/uL (ref 0.1–1.0)
Monocytes Relative: 8.3 % (ref 3.0–12.0)
NEUTROS ABS: 6 10*3/uL (ref 1.4–7.7)
NEUTROS PCT: 71.1 % (ref 43.0–77.0)
PLATELETS: 321 10*3/uL (ref 150.0–400.0)
RBC: 3.98 Mil/uL (ref 3.87–5.11)
RDW: 14.6 % (ref 11.5–15.5)
WBC: 8.5 10*3/uL (ref 4.0–10.5)

## 2016-02-02 MED ORDER — AMOXICILLIN-POT CLAVULANATE 875-125 MG PO TABS: 1.0000 | ORAL_TABLET | Freq: Two times a day (BID) | ORAL | Status: DC

## 2016-02-02 MED ORDER — METHYLPREDNISOLONE ACETATE 80 MG/ML IJ SUSP
80.0000 mg | Freq: Once | INTRAMUSCULAR | Status: AC
  Administered 2016-02-02: 80 mg via INTRAMUSCULAR

## 2016-02-02 MED ORDER — METHYLPREDNISOLONE 8 MG PO TABS: ORAL_TABLET | ORAL | Status: DC

## 2016-02-02 NOTE — Patient Instructions (Signed)
Today we updated your med list in our EPIC system...    Continue your current medications the same...  Today we cheked some follow up blood work & a CXR...    We will contact you w/ the results when available...   In the meanwhile>    Continue the [facility]...    We gave you a Depo shot today    We wrote for a diff antibiotic-- AUGMENTIN 875mg  - one tab twice daily til gone...    Be sure to stay on the ALIGN & Activia yogurt to help your bowels...  We also wrote for MEDROL 8mg  tabs-- take as follows>    Start 3/21/ AM w/ one tab twice daily for 3 days...    Then decrease to 1 tab each AM for 3 days...    Then decrease to 1/2 tab each AM for 3 days...    Then decrease to 1/2 tab every other day til gone (1/2, 0, 1/2, 0, etc)  Call for any questions...  Let's keep the planned f/u appt 03/15/16.Marland KitchenMarland Kitchen

## 2016-02-06 NOTE — Progress Notes (Signed)
Quick Note:  Called and spoke to pt. Informed her of the results and recs per SN. Pt verbalized understanding and denied any further questions or concerns at this time. ______ 

## 2016-02-07 NOTE — Progress Notes (Signed)
Subjective:    Patient ID: Jocelyn [name], female    DOB: 1956-04-12, 60 y.o.   MRN: [id number]  HPI 60 y/o BF here for a follow up visit... she has multiple medical problems including:  AR & Asthma;  Hx Sarcoid;  Mild OSA;  HBP;  CAD;  Ven Insuffic;  Hyperchol;  DM;  GERD/ IBS;  Hx Kidney stones;  FM & DX w/ polymyositis/ Scleroderma overlap syndrome in 2016;  Vit D defic;  Anxiety... ~  SEE PREV EPIC NOTES FOR THE OLDER DATA >>    LABS 7/13 by [name]> FLP- at goals on Lip+Zetia;  Chems- wnl;  CBC- Hg=12.4, Ferritin low at 14.9, B12= 211; TSH=1.53;   December 04, 2012:  [name] has mult somatic complaints and they all seem to revolve around not resting well & aching/ sore/ tender in chest wall; we reviewed FM diagnosis which she has carried for yrs and prev saw [name]- Rec trial Lunesta 534mQhs & Tramadol Tid prn...      CXR 1/14 showed cardiomeg, clear lungs, no adenopathy, mild scoliosis..[name]   ~  February 13, 2014:  6234moOV & [name] has mult somatic complaints- recent gastroenteritis (on Zofran & Immodium), c/o swelling all over "they don't know what it is" states she watches salt & exercises by walking 3-4d/wk... We reviewed the following medical problems during today's office visit >>     AR/ AB/ Hx Sarcoid> on Mucinex & MMW prn; denies cough, sputum, hemoptysis, or ch in dyspnea- she has SOB/DOE but is too sedentary & needs to incr exercise program; some CWP from her FM & we discussed FM rx below...    HBP/ CAD> on ASA81, Toprol50-1/2Bid, Cardizem240, Lasix20prn (ave 1/d) & ?Amlodip5 from [employer] BP=124/76 & she has mult somatic c/o- CWP, tired/no energy, but denies palpit, ch in SOB, edema, etc; followed by [name]- [name], no recent notes.    CHOL> off Lipitor => notes from [employer] ?Lip80 +[employer] study drug? (both stopped due to elev CPK); she has gained 8# to 198# today; [facility] & she reports concern for "particle size" & last avail FLP was 7/14 by  [name] w/ TChol 190, HDL 100, LDL 40...    DM> followed by [name] on Metform500Bid & Onglyza5 => ?he changed to Kombiglyze5-1000 daily?; [place] labs [place] showed BS=105, A1c=6.5; and she remains on the same meds...    GI> GERD, IBS> on Protonix40; denies abd pain, n/v, d/c, or blood seen; last colonoscopy was 7/03 by [name] and reported wnl; f/u due now esp in light of her low Ferritin level...    FM> This is her CC w/ not resting well, wakes tired, poor energy, aching/ sore/ tender (esp in chest in AM) & we discussed the importance of a good night's rest- states she's INTOL Ambien w/ weird dreams & Benedryl/ Melatonin w/o help...    Anxiety> on Xanax0.34m28mrn, she was INTOL to Zoloft & she stopped prev Lexapro Rx...    Vit D defic> she tells me that [name] treated her low VitD w/ 50K weekly, then switched to daily OTC supplement...    Vit B12 defic> [name] has her on B12 shots (weekly x4, then monthly) for B12 level done 8/14 = 154...    Borderline anemia & low Ferritin> pt reports that labs from DrKAultowed this & he wanted us [name] eval & treat; Labs 9/14 showed Hg= 12.5, MCV=88, Fe=47 (12%sat), Ferritin=10.8; she is due for f/u colon  w/ [name] & we will refer, rec to start FeSO4 315m Bid w/ VitC500 w/ plans for f/u CBC- done 4/15 w/ Hg=12.5, Fe=31 (8%sat) but only taking Iron once daily; rec to increase to Bid... We reviewed prob list, meds, xrays and labs> see below for updates >>   CXR 4/15 showed mild cardiomeg, clear lungs, NAD...  LABS 4/15:  Chems- wnl;  CBC- ok w/ Hg=12.5 but Fe=31 (8%);  TSH=1.62;  ACE=56 (8-52);  BNP=26...  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  In the  interval she has lost 1#,  BP= 134/80, & she has started FeSO4 1-2/d & GI appt w/ [name] is still pending...  She notes some constip on the Fe and we will rec Ferrosequels or similar product + laxatives prn...  ~  September 09, 2014:  63m103moV & here for pulm follow up>  She has hx old Sarcoid in 1980's, no activity x yrs; CXR 4/15 stable, NAD;  She tells me that her daughter noted her to be wheezing this past summer, usually w/ activ but sometimes on the phone; she had recent URI, toook ZPak & improved;  Exam shows scat rhonchi and we discussed need for MUCINEX 1200m67m, Fluids, etc;  PFT today is ok ?min restriction, no signif obstructive dis...    [facility] seen 9/15 w/ hx HBP, mild CAD in branch vessel, unable to exercise due to knees, c/o SOB but no CP; he increased her Losartan to 100mg64m Currently taking ASA81, Metoprolol25Bid, DiltiazemCD240, Losartan50-2/d, Lasix20-2Qam; BP= 138/80 & she denies CP, palpit, ch in SOB, edema, etc...     Lipid status is complex> she was on Liptor but this was stopped due to elev musc enzymes; then she entered drug study thru [employer] but apparently musc enz were elevated there too & med was stopped after a short time, subseq serial musc enz remained elev according to [name]; he challenged her w/ Lip10 & had to stop that too- he has referred her to Rheum but she hasn't gone yet; I have encouraged her to pursue Rheum consultation in light of elev musc enz off all statin & chol meds; she denies weakness but had some leg pain on statin...     She tried Belviq per [name], ok'd by [name]- but she tells me that it made her "feel funny" therefore she stopped it after just 2wks; wt is up 5# to 202# in our office today...    She continues regular f/u w/ [name] for Endocrine> DM & B12 defic;  last note in Epic is 4/15 w/ BS=85, A1c=6.1 on Kombiglyze 03-999 daily and B12 shots 1000mcg63mthly...     She had GI f/u [name] 5/15> iron defic anemia,  IBS, constip from Fe; she did EGD 7/15 showing gastric polyp in cardia (path=hyperplastic); and Colonoscopy 7/15 showing 2 sessile polyps in sigmoid (path=tubular adenoma) & rec to take Protonix40 & hi fiberr diet. We reviewed prob list, meds, xrays and labs> see below for updates >>   CXR 4/15 showed mild cardiomeg, clear lungs, NAD...  EKMarland KitchenMarland Kitchen9/15 showed NSR, rate80, 1st degree AVB, otherw norm EKG...  2DEcho 9/15 showed mild  LVH, norm LVF w/ EF=60-65%, norm wall motion, Gr1DD, norm valves w/ trivMR...   PFT 10/15 showed FVC=1.93 (79%), FEV1=1.55 (79%), %1sec=80%, mid-flows=70% predicted...   ~  March 13, 2015:  12moROV & Verneal indicates that she's "hanging in there"; today c/o 1wk hx increased congestion w/ cough/ yellow-green sput, +f/c/s, SOB & wheezing she says; she started on OTC MUCINEX and took a ZPak she had on hand but only min better=> we decided to treat w/ Levaquin, Medrol dosepak, Mucinex & fluids...  She has had numerous subspecialty follow up visits over the last 6376mo      She continues f/u w/ [name] for Cards> seen 3/16 for mild CAD in a branch vessel, exercise lim by knee arthritis, DOE w/ exertion, exam was neg; rec to continue ASA81, Metop25Bid, CardizemCD240, Losar100, Lasix40; she is off Lipitor & CoQ10; she is concerned about just following her abn CPK values...      She continues to f/u w/ [name] for Endocrine> seen 10/15 for DM (A1c=6.3, Umicroalb=neg), left adrenal adenoma (12x1468m stable, no change over time), B12 defic (level now>1500 on shots); on ASA81, KombiglyzeXR5-1000 one daily, B12 shots monthly;  She has also been evaluated by the [facility], [facility] "to learn how to eat healthy & lose weight"; she tries to exercise by [employer] (low impact)...      She had GI f/u w/ AE for [name]> she had EGD & Colon 7/15 (as below); presented c/o ext hem- Rx w/ sitz, baby wipes, benefiberAnusolHC...      Rheum- [name] (seen 11/15- note  reviewed), Ortho- [name]> bilat knee pain, XRays showed arthritis, prev given shot & knee brace; Rheum thought her elev CPK was an "isolated elevated CPK" as her strength was 5/5, didn't have polymyositis, norm AST/ALT, etc; they agreed w/ the Dx of fibromyalgia & he rec Flexeril & Alprazolam... The CPK issue appears complicated by Statin rx [facility] trial she enrolled in;  She is concerned due to other somatic complaints including right foot numb, & mult falls "I'm clumsy & fall for no reason" => refer to Neuro for their eval...      We reviewed prob list, meds, xrays and labs> see below for updates >>   CXR showed mild cardiomeg & sl tortuous Ao, clear lungs, DDD of Tspine, NAD... IMP/ PLAN>>  She has a refractory URI/ bronchitis & we decided to treat w/ Levaquin500m68mx7d, Align daily, and Medrol dosepak; she will continue her Mucinex600-2Bid + fluids;  She is still very concerned about the elev CPKs and not at all satisfied by [name]'s plan to just watch it, esp since it is rising she says; she has seen Rheum and no answer forthcoming so she would like to see Neurology for their opinion & consultation=> we will [facility] ~  September 15, 2015:  76mo 28mo& Doris saw [name] in Neuro- felt to have a myopathy and labs showed elev CPK (as hi as 1860, and a pos PM-Scl 100 antibody;  Clinically she had musc aches and some falling episodes, some numbness & tingling, no skin changes;  EMG/NCV was performed (most consistent with a non-necrotizing myopathy affecting the proximal leg muscles)- she was referred to [facility] who felt she has polymyositis or a PM/scleroderma overlap syndrome, he wanted to get a musc bx but decided to start Rx rather than wait & she is now on Pred & MTX w/ Folic... [name] manages her  Endocrine system- DM, adrenal adenoma, B12 defic, VitD defic, Hx low Fe (seen last 03/2015 w/ A1c=6.3, Hg=12.8, Ferritin =19, B12=551, VitD=20...  Since she has started on the Pred & MTX her CPK & aldolase enzymes have normalized & she has not fallen...     AR/ AB/ Hx Sarcoid> on Mucinex & MMW prn; denies cough, sputum, hemoptysis, or ch in dyspnea- she has SOB/DOE but is too sedentary & needs to incr exercise program; she requests a rescue inhaler for occas wheezing she encounters=> ProairHFA written for prn use...     HBP/ CAD> on ASA81, Toprol50-1/2Bid, Cardizem240, Cozaar50-2/d, Lasix20-2/d; BP=136/82 & she notes some chest discomfort & edema, denies palpit/ ch in SOB etc ; followed by [facility]- [name], no recent notes.    CHOL> off Lipitor w/ hx elev CPK (on diet alone); FLP is followed by [facility] Cards per pt & she reports concern for "particle size" & last avail FLP was 3/16 showed TChol 181, TG 162, HDL 44, LDL 104    DM> followed by [name] on Kombiglyze => [name] follows her labs and his notes are reviewed...    GI> GERD, IBS> on Protonix40; denies abd pain, n/v, d/c, or blood seen; last colonoscopy was 7/03 by [name] and reported wnl; f/u due now esp in light of her low Ferritin level...    Polymyositis, HxFM> SEE ABOVE and notes from [name] & DrBeekman now on Pred, MTX, & Folic...     Anxiety> on Xanax0.48m Prn, she was INTOL to Zoloft & she stopped prev Lexapro Rx...    Vit D defic> she tells me that [name] treated her low VitD w/ 50K weekly, then switched to daily OTC supplement...    Vit B12 defic> [name] has her on B12 shots (weekly x4, then monthly) for B12 level done 8/14 = 154...    Borderline anemia & low Ferritin> she was treated w/ Fe from [name] & improved... EXAM shows Afeb, VSS, O2sat=96% on RA;  HEENT- neg, mallampati2;  Chest- decr BS but clear w/o w/r/r;  Heart- RR gr1/6 SEM w/o r/g;  Abd- soft, neg;  Ext- VI, tr edema... IMP/PLAN>>  DKaneeshahas been diagnosed w/ polymyositis & started on Pred+MTX by DGlean Salen  She will continue Rx from him, add a Probiotic daily to aide digestion & we wrote for a rescue inhaler  per her request...   ~  October 20, 2015:  129moOV & add-on appt requested for persistent URI symptoms>  Cough, congestion, wheezing, some yellow mucus, assoc w/ chills & sweats (no fever reported)- "I feel awful", and she's already on Pred from Rheum for her PM/scleroderma overlap syndrome; ZPak called in & no better she says, has Proventil-HFA for prn use;  See prob list above...    EXAM shows Afeb, VSS, O2sat=96% on RA;  HEENT- neg, mallampati3;  Chest- decr BS & few bibasilar rhonchi w/ end-exp wheezing;  Heart- RR gr1/6 SEM w/o r/g;  Abd- soft, neg;  Ext- VI, tr edema. IMP/PLAN>>  We don't want to have to incr Pred- given Depo80, adding Levaquin500 x7d, ADVAIR100Bid, Mucinex600-2Bid + fluids...  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  Rheum recently stopped her PRED & MTX  in favor of IMURAN (we do not have recent notes)...    AR/ AB/ Hx Sarcoid> on Mucinex & Albut-HFA prn; recently w/ recurrent URIs=> refractory AB that's been hard to shake- she has baseline SOB/DOE & is too sedentary & needs to incr exercise program; treated 10/2015 w/ refractoryAB & adding Advair100Bid helped (pt stopped on her own)...     EXAM shows Afeb, VSS, O2sat=96% on RA; Wt=211#, 5'2"Tall, BMI=38; Heent- neg, mallampati3; Chest- decr BS & few bibasilar rhonchi w/ end-exp wheezing; Heart- RR gr1/6 SEM w/o r/g; Abd- soft, neg; Ext- VI, tr edema...  CXR 02/02/16> cardiomeg, mild vasc prom, no focal infiltrate/ NAD....  LABS 02/02/16>  Chems- wnl x BS=137;  CBC- wnl w/ Hg=12.6, WBC=8.5.Marland KitchenMarland Kitchen IMP/PLAN>>  We decided to Rx w/ Depo80, Medrol43m-3d taper sched, Augmentin875Bid & Align; she knows to continue Mucinex600-2Bid,  fluids, & rescue inhaler prn, may need the Advair restarted but holding off for now ($$)...         Problems List:  MENIERE'S DISEASE (ICD-3 - eval by ENT, DrBates- Rx w/ diuretic, low salt, Xanax vs Valium, & Antivert Prn... dizziness recurred & [facility] we don't have notes but pt reports that nothing was found.  ALLERGIC RHINITIS - increased allergy symptoms in the spring... rec> Claritin, Saline, Flonase...  Hx of ASTHMATIC BRONCHITIS, ACUTE  - requires occas Antibiotics and Medrol for infectious exac- last Oct09 & resolved w/ Avelox/ Depo/ Medrol... has not been on regular inhalers, but uses XOPENEX MDI (w/ spacer), MLyndon StationPrn... ~  essentially neg CTChest 9/08 after CXR suggested RULnodule. ~  CXR 10/09 showed cardiomegaly, clear lungs, min scoliosis, NAD. ~  CXR 9/11 showed cardiomeg, clear lungs, NAD..[name] ~  CXR 1/14 showed cardiomeg, clear lungs, no adenopathy, mild scoliosis... ~  CXR 4/15 showed mild cardiomeg, clear lungs, NAD... ~  PFT 10/15 showed FVC=1.93 (79%), FEV1=1.55 (79%), %1sec=80%, mid-flows=70% predicted...  ~  CXR showed mild cardiomeg & sl tortuous Ao, clear lungs, DDD of Tspine, NAD... ~  10/16:  We wrote for an AlbutHFA inhaler for prn use at her request for wheezing... ~  12/16: presents w/ refractory AB- given Levaquin, already on Pred per Rheum, added Advair100Bid + Mucinex600-2Bid... ~  02/02/16: another bout of refractory AB- treated w/ Depo, Medrol taper, Augmentin, Mucinex, Proventil...  Hx of SARCOIDOSIS  - Dx'd 1980's w/ bronch bx... Rx Pred transiently and improved... no active disease x years... ~  baseline CXRs showed cardiomegaly, clear lungs, min scoliosis, NAD.  Hx of Mild OBSTRUCTIVE SLEEP APNEA - sleep eval DrClance 2006 w/ RDI= 5 only.   [facility] >>   HYPERTENSION, BENIGN  >> followeed by [name] now for CARDS...  ~  on TOPROL XL 551m 1.5 tabs daily, CARDIZEM 24032m, LASIX 22m13must as needed now... ~  prev OV's 130-140's / 80's... prev noted sl HA, chest tightness, sl SOB, & mult somatic complaints... Labs & renal- all WNL. ~  4/12:  BP=122/76 today, and similar at home per pt... She denies CP, palpit, SOB, edema, etc... Labs & renal- all WNL. ~  1/14: on ASA81, Imdur120, ToprolXL75, Cardizem240, Lasix20prn (seldom takes); BP=128/80 & she has mult somatic c/o- CWP, tired/no energy, but denies palpit, ch in SOB, edema, etc; followed by [facility]- DrTurner/ [name] & seen recently but we don't have note; they prev added RANEXA 500mg17m for her CP (now off this med); we discussed trial Lunesta 2mgQh58mor sleep & Tramadol 50mgTi75mr  pain... ~  9/14:  on ASA81, Imdur120, Toprol50-1/2Bid, Cardizem240, Lasix20prn (seldom takes); BP=132/74 & she has mult somatic c/o- CWP, tired/no energy, but denies palpit, ch in SOB, edema, etc; followed by [facility]- DrTurner/ [name], no recent notes ~  4/15: on ASA81, Toprol50-1/2Bid, Cardizem240, Lasix20prn (ave 1/d) & ?Amlodip5 from [employer]?; BP=124/76 & she has mult somatic c/o- CWP, tired/no energy, but denies palpit, ch in SOB, edema, etc; followed by [facility]- [name], no recent notes. ~  4/16: on ASA81, Metop25Bid, CardizemCD240, Losar100, Lasix40; BP= 146/88 and reminded to elim sodium & get weight down...  CORONARY ARTERY DISEASE - on ASA 35m/d, IMDUR 121md, Toprol XL, Cardizem, Lasix as above... Followed by DrTTurner. ~  Cath in 2000 showed non-obstructive CAD (20-30% lesions in all 3 vessels) w/ rec for risk factor modification.   ~  NuclearStressTest 4/07 showed no ischemia & EF=73%. ~  Mar10:  she's concerned about BP and chest tightness, requests Cardiac eval DrTurner & we will refer... ~  2DEcho 4/10 showed norm LVF w/ EF=55-60%, norm MV, norm AoV, Gr1DD ~  4/10 eval by DrTurner w/ MYOVIEW- neg- breast attenuation, no regional wall motion abn, EF= 75% ~  4/10 Cath by DrTurner w/ 90% small 2nd diag branch of LAD  <too sm for PTCA> & luminal irreg up to 20% in RCA, +DD- Med Rx. ~  She had more chest tightness & another Myoview from DrTurner 8/12- it too was neg, no ischemia, no infarct; +breast attenuation; EF=70% & nno regional wall motion abnormalities;  IMDUR has been incr to 12058m. ~  1/14: she tells [facility] we don't have notes from him> EKG sent showed NSR, rate72, wnl, NAD... ~  EKG 5/14 showed NSR, rate 93, WNL, NAD... ~  12/14: she saw [name] for Cards f/u (note reviewed)> HBP, CAD, Chol; off Imdur w/o angina, continue same meds including both Cardizem & Amlodipine, encouraged to continue [employer] study... ~  She continues regular f/u visits w/ [name], CARDS> his notes are reviewed...  VENOUS INSUFFICIENCY - [facility] DrWWoods and seen by DrSevier 6/08= ven insuffic Rx w/ TED's, no salt, elevation, etc. ~  10/10: notes bilat ankle ?lipoma in front of the lat malleoli- asymptomatic but several people have noticed them and she request refer to Ortho (rec DrBednarz when she is ready).  HYPERCHOLESTEROLEMIA  - on LIPITOR 46m86m& ZETIA 10mg39m CoQ10 per DrTurner... Diet & exercise stressed to the pt. ~  FLP here 12/07 on Lip10 showed TChol 156, TG 94, HDL 51, LDL 86 ~  FLP 1/09 on Lip10 showed TChol 157, TG 122, HDL 49, LDL 84 ~  [facility] on Lip10 showed TChol 176, TG 116, HDL 55, LDL 98 ~  FLP 3/10 on Lip10 showed TChol 170, TG 94, HDL 59, LDL 93 ~  FLP 6/10 by DrTurner on Lip20 showed TChol 134, TG 82, HDL 55, LDL 73 ~  FLP 4/11 here on Lip20+Zetia? showed TChol 145, TG 74, HDL 68, LDL 63 ~  9/11:  she reports that DrTurner incr Lipitor to 46mg,69ms the Zetia 10mg, 106mshe does her FLPs. ~  FLP here 4/12 on Lip40+Zetia showed TChol 153, TG 54, HDL 53, LDL 90... Copy sent to DrTurner ~  FLP by [place] 7/13 on Lip40+Zetia10 showed TChol 137, TG 89, HDL 42, LDL 73 ~  9/14: on Lip40 => notes from Kerr saBurchinalLip80  +[employer] study drug?; she  has gained 4# to 190# today; FLP is followed by [facility] Cards per pt & she reports concern for "particle size" & last avail FLP was 7/14 by [name] w/ TChol 190, HDL 100, LDL 40 ~  FLP followed by [name] for CARDS, and [name] for Endocrine...   FOLLOWED by [name] FOR ENDOCRINOLOGY >>   DIABETES MELLITUS  - on Metformin 560mBid + PARLODEL 2.553md per DrEllison... She is intol to Actos w/ edema, she stopped Januvia due to ?side effect, she wondered about Tajenta since her daugh works for [employer]~  labs 9/08 showed FBS=119, HgA1c=6.6 ~  [facility] BS= 108, HgA1c= 6.5 ~  labs 3/10 showed BS= 113, A1c= 6.6 ~  labs 6/10 by DrTurner showed BS= 92; and 3/11 BS= 127 ~  labs 4/11 showed BS= 124, A1c= 7.6...Marland Kitchenrec> incr Metform to Bid; [facility]... ~  6/11:  pt requested Endocrine consult & seen by DrEllison w/ Januvia 10020m added. ~  labs 9/11 showed BS= 98, A1c= 7.3... Marland KitchenMarland Kitchene stopped Januvia due to side effects & wonders about using Tajenta instead. ~  DrEllison started Bro[employer]5mg24m.. She is now taking this +Metform 500mg74m.. ~  Labs 4/12 (wt=195#) showed Bs= 116, A1c= 7.1 ~  Labs 7/12 showed A1c= 7.2;  Umicroalb= 3.5 ~  Labs 10/12 (wt=186#) showed A1c= 6.7 NOTE: She participated in a [facility] trial called the AfricanAmerican- Diabetes Heart Study in 2012; they sent a report indicating: 1) her memory was fine, but her MRI Brain showed sm vessel dis & mild sinus dis, otherw neg; 2) she may have treatable depression but we tried her on Zoloft & she was intol==> ch to Lexapro. ~  followed by [name] on Metform500Bid & Onglyza2.5; labs from DrKerTalbotton showed BS=201, A1c=6.0; he rec same meds... ~  9/14: followed by [name] on Metform500Bid & Onglyza2.5 => ?he changed to Kombiglyze5-1000 daily?; last labs from DrKerSlater-Marietta showed BS=105, A1c=6.5; and she remains on the same meds... ~  1/15: she had f/u [name] for  Endocrine> DM, Obesity, Adrenal adenoma, VitB12 defic; labs reviewed, note reviewed- he tried Belviq but she was intol... ~  She continues regular follow up w/ [name]...   FOLLOWED by [name] for GASTORENTEROLOGY >>   GERD SYMPTOMS - pt placed on PROTONIX 40mg/42m improvement in reflux symptoms...    ADDENDUM>> note from [name] indicates> upper endoscopy in July 1994 showed antral gastritis. 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  IRRITABLE BOWEL SYNDROME  - colonoscopy 7/03 by [name] was WNL. ~  9/14: she was referred to GI for f/u colon but never did it... ~  4/15: we will refer her to GI again...  RENAL CALCULUS  - sm stone seen in L kidney on CTScan... [facility]  POLYMYOSITIS >> now followed by DrBeekman FIBROMYALGIA  - prev Rx by [name] in 2006... ~  1/14: This is her CC w/ not resting well, wakes tired, poor energy, aching/ sore/ tender (esp in chest in AM) & we discussed trial Lunesta 2mgQhs68m Tramadol 50mgTid22mus rest, heat, etc... States she's INTOL Ambien w/ weird dreams & Benedryl/ Melatonin w/o help... ~  11/14: she went to the ER w/ LBP> felt to be a lumbar strain... ~  She was seen by [name] for Rheum [id number]> Silas sLangley Gaussatel in Neuro- felt to have a myopathy and labs showed elev CPK (as hi as 1860, and a  pos PM-Scl 100 antibody;  Clinically she had musc aches and some falling episodes, some numbness & tingling, no skin changes;  EMG/NCV was performed (most consistent with a non-necrotizing myopathy affecting the proximal leg muscles)- she was referred to [facility], Rheum who felt she has polymyositis or a PM/scleroderma overlap syndrome, he wanted to get a musc bx but decided to start Rx rather than wait & she is now on Pred & MTX w/ Folic  VITAMIN D DEFICIENCY - Vit D level was 30 at [facility] study in fall 2009... supplemented w/ OTC Vit D 1000 u daily. ~  Vit D level was  lower from [name] & he treated w/ Vit D 50K weekly for awhile, then switched to OTC supplement...  ANXIETY - on ALPRAZOLAM 0.77m Prn... DEPRESSION - prev on Lexapro 246md but she stopped on her own ~10/13 ?cost issues? ~  4/12: c/o feeling sad & anxious all the time- weak, not resting well, under a lot of stress; rec to seek counselling thru her local church or [facility] Zoloft [facility]> but she was INTOL... ~  2013: switched to Lexapro & seems to be doing better but didn't stick w/ it due to cost issues...  ANEMIA & LOW FERRITIN LEVEL >>  B12 DEFICIENCY >> treated by [name] w/ B12 shots ~  9/14: she tells me that Ferritin is low & [name] tried her on Oral Fe prep w/o improvement & wants me to f/u and treat this problem; we discussed need to recheck labs here- LABS 9/14 showed Hg=12.5, MCV=88, Fe=47 (12%sat), Ferritin=10.8; she is due for f/u colon w/ [name] & we will refer, rec to start FeSO4 32516mid w/ VitC500 w/ plans for f/u CBC, Fe studies in 2-31mo22mo 4/15:  Labs showed Hg= 12.5 but Fe=31 (8%); she was only taking the Fe once daily 7 advised to incr to Bid w/ vitC500; needs f/u colon & we will refer to [name] again...  HEALTH MAINTENANCE: ~  GI:  Followed by [name] & colon due 7/13... ~  GYN:  Followed by DrDillard & she gets yearly PAP, Mammogram, hasn't had baseline BMD yet, [facility] ~  Immunizations:  She gets the yearly Flu shots at school;  Had PNEUMOVAX previously; TDAP given 4/12...   No past surgical history on file - other than Lithotripsy for renal stone...   Outpatient Encounter Prescriptions as of 02/02/2016  Medication Sig  . albuterol (PROVENTIL HFA;VENTOLIN HFA) 108 (90 BASE) MCG/ACT inhaler Inhale 1-2 puffs into the lungs every 6 (six) hours as needed for wheezing or shortness of breath.  . ALPRAZolam (XANAX) 0.5 MG tablet take 1/2 tablet by mouth IN THE MID-MORNING AND MID-AFTERNOON, and take 1 tablet once daily  at bedtime if needed  . azaTHIOprine (IMURAN) 50 MG tablet Take 100 mg by mouth 2 (two) times daily.  . Cyanocobalamin 1000 MCG/ML KIT Inject 1,000 mg as directed every 30 (thirty) days.  . diMarland Kitchentiazem (CARDIZEM CD) 240 MG 24 hr capsule take 1 capsule by mouth once daily  . folic acid (FOLVITE) 1 MG tablet Take 1 mg by mouth daily.  . loMarland Kitchenartan (COZAAR) 50 MG tablet Take 2 tablets (100 mg total) by mouth daily.  . metoprolol tartrate (LOPRESSOR) 25 MG tablet take 1 tablet by mouth twice a day  . ondansetron (ZOFRAN) 4 MG tablet Take 1 tablet (4 mg total) by mouth every 8 (eight) hours as needed for nausea or vomiting.  . pantoprazole (  PROTONIX) 40 MG tablet Take 1 tablet (40 mg total) by mouth daily.  . sitaGLIPtin-metformin (JANUMET) 50-1000 MG tablet Take 1 tablet by mouth 2 (two) times daily with a meal.  . valACYclovir (VALTREX) 500 MG tablet Take 500 mg by mouth daily.   . [DISCONTINUED] aspirin 81 MG tablet Take 81 mg by mouth daily.    . [DISCONTINUED] ferrous sulfate 325 (65 FE) MG tablet Take 325 mg by mouth daily with breakfast. Iron supplement does contain vitamin C as well - pt uses OTC brand  . [DISCONTINUED] furosemide (LASIX) 20 MG tablet Take 2 tablets by mouth every morning as needed  . [DISCONTINUED] hydrocortisone (ANUSOL-HC) 2.5 % rectal cream Place 1 application rectally 4 (four) times daily. (Patient not taking: Reported on 10/20/2015)  . [DISCONTINUED] levofloxacin (LEVAQUIN) 500 MG tablet Take 1 tablet (500 mg total) by mouth daily.  . [DISCONTINUED] levofloxacin (LEVAQUIN) 500 MG tablet Take 1 tablet (500 mg total) by mouth daily.  . [DISCONTINUED] methotrexate (RHEUMATREX) 2.5 MG tablet Take 12.5 mg by mouth once a week. Caution:Chemotherapy. Protect from light.  . [DISCONTINUED] methylPREDNISolone (MEDROL DOSEPAK) 4 MG TBPK tablet Take as directed  . [DISCONTINUED] nitroGLYCERIN (NITROSTAT) 0.4 MG SL tablet Place 1 tablet (0.4 mg total) under the tongue every 5 (five)  minutes as needed for chest pain.  . [DISCONTINUED] Saxagliptin-Metformin (KOMBIGLYZE XR) 03-999 MG TB24 Take 1 tablet by mouth once.    Allergies  Allergen Reactions  . Actos [Pioglitazone]     REACTION: pt states INTOL w/ edema  . Codeine     REACTION: vomiting  . Januvia [Sitagliptin] Nausea Only  . Lactose Intolerance (Gi)   . Morphine Nausea Only    REACTION: vomiting  . Sitagliptin Phosphate     REACTION: nausea    Current Medications, Allergies, Past Medical History, Past Surgical History, Family History, and Social History were reviewed in [employer] record.   Review of Systems        See HPI - all other systems neg except as noted...      The patient complains of weight gain, dyspnea on exertion, and peripheral edema.  The patient denies anorexia, fever, weight loss, vision loss, decreased hearing, hoarseness, chest pain, syncope, prolonged cough, headaches, hemoptysis, abdominal pain, melena, hematochezia, severe indigestion/heartburn, hematuria, incontinence, genital sores, suspicious skin lesions, transient blindness, difficulty walking, depression, unusual weight change, abnormal bleeding, enlarged lymph nodes, and angioedema.     Objective:   Physical Exam     WD, Overweight, 60 y/o BF in NAD... GENERAL:  Alert & oriented; pleasant & cooperative. HEENT:  [place]/AT, EOM-wnl, PERRLA, EACs-clear, TMs-wnl, NOSE-clear, THROAT-clear & wnl. NECK:  Supple w/ fairROM; no JVD; normal carotid impulses w/o bruits; no thyromegaly or nodules palpated; no lymphadenopathy. CHEST:  decrBS at bases, few scar rhonchi & end-exp wheezing, no rales, no consolidation... HEART:  Regular Rhythm; without murmurs/ rubs/ or gallops. ABDOMEN:  Soft & nontender; normal bowel sounds; no organomegaly or masses detected. EXT: without deformities, mild arthritic changes; no varicose veins but +venous insuffic & tr edema; +trigger points. NEURO:  CN's intact;  no focal neuro  deficits... DERM:  No lesions noted; no rash etc...  RADIOLOGY DATA:  Reviewed in the EPIC EMR & discussed w/ the patient...  LABORATORY DATA:  Reviewed in the EPIC EMR & discussed w/ the patient...   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 CXR remains stable/ NAD... 10/20/15>  Presented w/ refractory AB- already on Pred- given Depo80, adding Levaquin500 x7d, ADVAIR100Bid, Mucinex600-2Bid + fluids... 02/02/16>  Presents w/ another refractory AB- currently off Pred & MTX, on Imuran; treated w/ Depo, Medrol taper, Augmentin, Mucinex, Albut=HFA...    CARDS>  Followed by DrTTurner/ VRica Mote?on meds listed? SHE DID NOT BRING MEDS TO THE OV> Hx HBP, CAD, VI, etc;  Chest pain is non-cardiac & likely related to her FM; Doing satis & we reviewed exercise program etc...  CHOL>  [facility]- off Lip + off Zetia; ? [place]  DM>  Followed by [name] on Metformin, Onglyzal; A1c improved to 6.7 in 2012, then 6.0 in 2013, & 6.5  In 2014;  rec continue meds & [place]  GI>  Stable on Protonix as needed; see GI eval from [name] 7/15...  POLYMYOSITIS/ FM>  Diagnoses w/ Polymyositis recently (2016) & started on Pred, MTX, folate by DrBeekman...  Anxiety>  She remains on Alprazolam as needed; & she stopped the Lexapro..  Anemia, Low Ferritin> on FeSO4 3292m& VitC 500...   B12 Defic> on B12 shots per [name]...   Patient's Medications  New Prescriptions   AMOXICILLIN-CLAVULANATE (AUGMENTIN) 875-125 MG TABLET    Take 1 tablet by mouth 2 (two) times daily.   METHYLPREDNISOLONE (MEDROL) 8 MG TABLET    Take as directed  Previous Medications   ALBUTEROL (PROVENTIL HFA;VENTOLIN HFA) 108 (90 BASE) MCG/ACT INHALER    Inhale 1-2 puffs into the  lungs every 6 (six) hours as needed for wheezing or shortness of breath.   ALPRAZOLAM (XANAX) 0.5 MG TABLET    take 1/2 tablet by mouth IN THE MID-MORNING AND MID-AFTERNOON, and take 1 tablet once daily at bedtime if needed   AZATHIOPRINE (IMURAN) 50 MG TABLET    Take 100 mg by mouth 2 (two) times daily.   CYANOCOBALAMIN 1000 MCG/ML KIT    Inject 1,000 mg as directed every 30 (thirty) days.   DILTIAZEM (CARDIZEM CD) 240 MG 24 HR CAPSULE    take 1 capsule by mouth once daily   FOLIC ACID (FOLVITE) 1 MG TABLET    Take 1 mg by mouth daily.   LOSARTAN (COZAAR) 50 MG TABLET    Take 2 tablets (100 mg total) by mouth daily.   METOPROLOL TARTRATE (LOPRESSOR) 25 MG TABLET    take 1 tablet by mouth twice a day   ONDANSETRON (ZOFRAN) 4 MG TABLET    Take 1 tablet (4 mg total) by mouth every 8 (eight) hours as needed for nausea or vomiting.   PANTOPRAZOLE (PROTONIX) 40 MG TABLET    Take 1 tablet (40 mg total) by mouth daily.   SITAGLIPTIN-METFORMIN (JANUMET) 50-1000 MG TABLET    Take 1 tablet by mouth 2 (two) times daily with a meal.   VALACYCLOVIR (VALTREX) 500 MG TABLET    Take 500 mg by mouth daily.   Modified Medications   No medications on file  Discontinued Medications   ASPIRIN 81 MG TABLET    Take 81 mg by mouth daily.     FERROUS SULFATE 325 (65 FE) MG TABLET    Take 325 mg by mouth daily with breakfast. Iron supplement does contain vitamin C as well - pt uses OTC brand   FUROSEMIDE (LASIX) 20 MG TABLET  Take 2 tablets by mouth every morning as needed   HYDROCORTISONE (ANUSOL-HC) 2.5 % RECTAL CREAM    Place 1 application rectally 4 (four) times daily.   LEVOFLOXACIN (LEVAQUIN) 500 MG TABLET    Take 1 tablet (500 mg total) by mouth daily.   LEVOFLOXACIN (LEVAQUIN) 500 MG TABLET    Take 1 tablet (500 mg total) by mouth daily.   METHOTREXATE (RHEUMATREX) 2.5 MG TABLET    Take 12.5 mg by mouth once a week. Caution:Chemotherapy. Protect from light.   METHYLPREDNISOLONE (MEDROL DOSEPAK) 4 MG TBPK  TABLET    Take as directed   NITROGLYCERIN (NITROSTAT) 0.4 MG SL TABLET    Place 1 tablet (0.4 mg total) under the tongue every 5 (five) minutes as needed for chest pain.   SAXAGLIPTIN-METFORMIN (KOMBIGLYZE XR) 03-999 MG TB24    Take 1 tablet by mouth once.

## 2016-02-09 ENCOUNTER — Other Ambulatory Visit: Payer: Self-pay | Admitting: Interventional Cardiology

## 2016-02-25 ENCOUNTER — Encounter: Payer: Self-pay | Admitting: Podiatry

## 2016-02-25 ENCOUNTER — Ambulatory Visit: Payer: Self-pay

## 2016-02-25 ENCOUNTER — Ambulatory Visit (INDEPENDENT_AMBULATORY_CARE_PROVIDER_SITE_OTHER): Payer: BC Managed Care – PPO | Admitting: Podiatry

## 2016-02-25 VITALS — BP 128/82 | HR 83 | Resp 16

## 2016-02-25 DIAGNOSIS — L309 Dermatitis, unspecified: Secondary | ICD-10-CM

## 2016-02-25 DIAGNOSIS — L600 Ingrowing nail: Secondary | ICD-10-CM

## 2016-02-25 DIAGNOSIS — M779 Enthesopathy, unspecified: Secondary | ICD-10-CM | POA: Diagnosis not present

## 2016-02-25 DIAGNOSIS — M79675 Pain in left toe(s): Secondary | ICD-10-CM

## 2016-02-25 MED ORDER — TERBINAFINE HCL 250 MG PO TABS: ORAL_TABLET | ORAL | Status: DC

## 2016-02-25 MED ORDER — NEOMYCIN-POLYMYXIN-HC 1 % OT SOLN: OTIC | Status: DC

## 2016-02-25 NOTE — Progress Notes (Signed)
   Subjective:    Patient ID: Jocelyn [name], female    DOB: Jul 10, 1956, 60 y.o.   MRN: [id number]  HPI  Chief Complaint  Patient presents with  . Toe Pain    1st toe left - tender for couple months, more noticeable at the tip of toe and around the toenail, no injury, no treatment      Review of Systems  Respiratory: Positive for wheezing.   All other systems reviewed and are negative.      Objective:   Physical Exam        Assessment & Plan:

## 2016-02-25 NOTE — Patient Instructions (Signed)
ANTIBACTERIAL SOAP INSTRUCTIONS  THE DAY AFTER PROCEDURE   For the first dressing change (soak) Place 3-4 drops of antibacterial liquid soap in a quart of warm tap water.  Submerge foot into water for 20 minutes.  If bandage was applied after your procedure, leave on to allow for easy lift off, then remove and continue with soak for the remaining time.  Next, blot area dry with a soft cloth and apply antibiotic ointment such as polysporin, neosporin, or triple antibiotic ointment.  You may then switch to the instructions below    Shower as usual. Before getting out, place a drop of antibacterial liquid soap (Dial) on a wet, clean washcloth.  Gently wipe washcloth over affected area.  Afterward, rinse the area with warm water.  Blot the area dry with a soft cloth and cover with antibiotic ointment (neosporin, polysporin, bacitracin) and band aid or gauze and tape    Long Term Care Instructions-Post Nail Surgery  You have had your ingrown toenail and root treated with a chemical.  This chemical causes a burn that will drain and ooze like a blister.  1-2 weeks after the procedure you may leave the area open to air at night to help dry it up.  During the day,  It is important to keep this area clean and covered until the toe dries out and forms a scab. Once the scab forms you no longer need to soak or apply a dressing.  If at any time you experience an increase in pain, redness, swelling, or drainage, you should contact the office as soon as possible.    

## 2016-02-25 NOTE — Progress Notes (Signed)
Subjective:     Patient ID: Jocelyn [name], female   DOB: October 27, 1956, 60 y.o.   MRN: [id number]  HPI patient presents with pain in the left big toe of approximate 2 months duration with damage to the big toenail over all   Review of Systems  All other systems reviewed and are negative.      Objective:   Physical Exam  Constitutional: She is oriented to person, place, and time.  Cardiovascular: Intact distal pulses.   Musculoskeletal: Normal range of motion.  Neurological: She is oriented to person, place, and time.  Skin: Skin is warm.  Nursing note and vitals reviewed.  neurovascular status intact muscle strength adequate range of motion within normal limits with incurvated left hallux lateral border with thickness of the nailbed itself. It is very tender when I pressed the lateral border and appears were her pain is coming from with distal redness but no active drainage. The digital perfusion and well oriented 3     Assessment:     Ingrown toenail deformity left hallux lateral border with pain    Plan:     H&P condition reviewed and recommended removal of the corner. Explained procedure to patient and risk and today I infiltrated the left hallux 60 Milligan times like Marcaine mixture remove the lateral corner exposed matrix and applied phenol 3 applications 30 seconds followed by alcohol lavage and sterile dressing. Gave instructions on soaks and reappoint

## 2016-03-03 ENCOUNTER — Other Ambulatory Visit: Payer: Self-pay | Admitting: Physician Assistant

## 2016-03-05 ENCOUNTER — Telehealth: Payer: Self-pay | Admitting: *Deleted

## 2016-03-05 NOTE — Telephone Encounter (Signed)
Called patient at [phone] (Home #) to check to see how they were doing from their ingrown toenail procedure that was performed on Wednesday, February 25, 2016. Pt stated, "Toe is doing okay, but still a little sore". Pt is soaking their toe with some relief. Patient said that she has been wrapping her toe up in coflex stretch tape. I recommended to patient to switch over to a band-aid and wrap around toe loosely. I also recommended to patient to let some air get on the toe when they do not need a band-aid to help with healing. Pt stated she understood.

## 2016-03-15 ENCOUNTER — Ambulatory Visit: Payer: BC Managed Care – PPO | Admitting: Pulmonary Disease

## 2016-03-17 ENCOUNTER — Ambulatory Visit (INDEPENDENT_AMBULATORY_CARE_PROVIDER_SITE_OTHER): Payer: BC Managed Care – PPO | Admitting: *Deleted

## 2016-03-17 DIAGNOSIS — M779 Enthesopathy, unspecified: Secondary | ICD-10-CM

## 2016-03-17 NOTE — Progress Notes (Signed)
Patient ID: Jocelyn [name], female   DOB: [birth year], 60 y.o.   MRN: [id number] Patient presents for orthotic pick up.  Verbal and written break in and wear instructions given.  Patient will follow up in 4 weeks if symptoms worsen or fail to improve.

## 2016-03-17 NOTE — Patient Instructions (Signed)

## 2016-03-23 ENCOUNTER — Telehealth: Payer: Self-pay | Admitting: Interventional Cardiology

## 2016-03-23 NOTE — Telephone Encounter (Signed)
New message       Pt c/o swelling: STAT is pt has developed SOB within 24 hours  1. How long have you been experiencing swelling? 1 wk  2. Where is the swelling located?  Ankles, feet, legs 3.  Are you currently taking a "fluid pill"? Lasix 10mg  daily  4.  Are you currently SOB?  yes 5.  Have you traveled recently? no Pt thinks she needs to be seen sooner than the 04-08-16 appt that was offered

## 2016-03-23 NOTE — Telephone Encounter (Signed)
Pt states she  noticed LE edema the last week.  Dr Hassell Done note from March 2016 notes amlodipine stopped due to LE edema, but the swelling has persisted off amlodipine. Pt does not recall if LE edema is better or worse now than in March 2016. Pt states she noticed LE edema over the weekend that when she took her shoes. Pt states she may have slight increase in shortness of breath, pt does not weigh daily. Pt states she saw her rheumatologist last week. Pt states she had LE edema at that time, the rheumatologist told her she may have some swelling, did not have any new recommendations. Pt states her heart rate and BP were good at that time. Pt states she has lasix to take prn and has taken extra lasix this week without improvement in LE edema.  Pt states there have been no changes in diet or medications in the last week. Pt states she does not eat any salt. Pt states she has had a hard time getting a follow up appt and is asking for an appt to be checked.   Pt advised I have scheduled her to see Kathrene Alu 03/26/16 at [place]. Pt advised to limit salt in her diet, keep feet and legs elevated as much as possible, seek medical care if symptoms get change.

## 2016-03-26 ENCOUNTER — Ambulatory Visit (INDEPENDENT_AMBULATORY_CARE_PROVIDER_SITE_OTHER): Payer: BC Managed Care – PPO | Admitting: Nurse Practitioner

## 2016-03-26 ENCOUNTER — Encounter: Payer: Self-pay | Admitting: Nurse Practitioner

## 2016-03-26 VITALS — BP 144/90 | HR 75 | Ht 62.0 in | Wt 214.0 lb

## 2016-03-26 DIAGNOSIS — R0789 Other chest pain: Secondary | ICD-10-CM | POA: Diagnosis not present

## 2016-03-26 LAB — CBC
HCT: 37.5 % (ref 35.0–45.0)
Hemoglobin: 12.8 g/dL (ref 11.7–15.5)
MCH: 30.5 pg (ref 27.0–33.0)
MCHC: 34.1 g/dL (ref 32.0–36.0)
MCV: 89.5 fL (ref 80.0–100.0)
MPV: 9.5 fL (ref 7.5–12.5)
Platelets: 252 10*3/uL (ref 140–400)
RBC: 4.19 MIL/uL (ref 3.80–5.10)
RDW: 13.6 % (ref 11.0–15.0)
WBC: 6.2 10*3/uL (ref 3.8–10.8)

## 2016-03-26 MED ORDER — FUROSEMIDE 20 MG PO TABS: 20.0000 mg | ORAL_TABLET | Freq: Every day | ORAL | Status: DC

## 2016-03-26 NOTE — Patient Instructions (Addendum)
We will be checking the following labs today - BMET, CBC and BNP   Medication Instructions:    Continue with your current medicines.   Lasix 20 mg daily - I sent this refill in for you    Testing/Procedures To Be Arranged:  Echocardiogram  Follow-Up:   See me in about a month    Other Special Instructions:   No more eating out - really try to restrict your salt  No more Pepsi  Knee high support stockings  No Advil/Aleve   Here are my tips to lose weight:  1. Drink only water. You do not need milk, juice, tea, soda or diet soda.  2. Do not eat anything "white". This includes white bread, potatoes, rice or mayo  3. Stay away from fried foods and sweets  4. Your portion should be the size of the palm of your hand.  5. Know what your weaknesses are and avoid.  6. Find an exercise you like and do it every day for 45 to 60 minutes.           If you need a refill on your cardiac medications before your next appointment, please call your pharmacy.   Call the [place] office at [phone] if you have any questions, problems or concerns.

## 2016-03-26 NOTE — Progress Notes (Signed)
CARDIOLOGY OFFICE NOTE  Date:  03/26/2016    Jocelyn [name] Date of Birth: 1956-08-26 Medical Record #[id number]  PCP:  Noralee Space, MD  Cardiologist:  Irish Lack    Chief Complaint  Patient presents with  . Edema    Work in visit - seen for Dr. Irish Lack    History of Present Illness: Jocelyn [name] is a 60 y.o. female who presents today for a work in visit. Seen for Dr. Irish Lack.   She has a history of obesity, HLD, OSA, sarcoidosis, DM, fibromyalgia and CAD (noted as "mild branch disease and otherwise is undefined).   Has not been seen here since March of 2016.   Comes in today. Here alone. Here for swelling. Was on Norvasc long time ago- this has been stopped. She feels like her BP is well controlled. Diabetes is not. She drinks Pepsi. Eats out most meals. Does not feel like she is using salt - but she is getting too much. No real chest pain. She has DOE. Weight continues to climb. She is quite swollen and notes her legs are discolored. Not using support stockings. Does not really elevate her legs. She is taking one to two lasix every day with no real improvement.   Past Medical History  Diagnosis Date  . Meniere's disease, unspecified   . Allergic rhinitis, cause unspecified   . Acute bronchitis   . Sarcoidosis ([place])   . Obstructive sleep apnea (adult) (pediatric)   . Essential hypertension, benign   . Coronary atherosclerosis of unspecified type of vessel, native or graft   . Unspecified venous (peripheral) insufficiency   . Pure hypercholesterolemia   . Type II or unspecified type diabetes mellitus without mention of complication, not stated as uncontrolled   . Irritable bowel syndrome   . Calculus of kidney   . Fibromyalgia   . Vitamin D deficiency   . Dizziness   . Anxiety   . Myalgia and myositis, unspecified   . Calculus of kidney   . BV (bacterial vaginosis) 06/22/1996  . H/O varicella   . Headache(784.0)     frequently  . Yeast infection   . Menses,  irregular 2003  . Perimenopausal symptoms 2003  . Fibroid 2003  . H/O dysmenorrhea 2008  . HSV-2 infection 2009  . Vulvitis 2010  . B12 deficiency   . Anemia   . Hyperplastic colon polyp 05/16/2014    Past Surgical History  Procedure Laterality Date  . Tonsillectomy    . Heart catherization       Medications: Current Outpatient Prescriptions  Medication Sig Dispense Refill  . albuterol (PROVENTIL HFA;VENTOLIN HFA) 108 (90 BASE) MCG/ACT inhaler Inhale 1-2 puffs into the lungs every 6 (six) hours as needed for wheezing or shortness of breath. 8 g 2  . ALPRAZolam (XANAX) 0.5 MG tablet take 1/2 tablet by mouth IN THE MID-MORNING AND MID-AFTERNOON, and take 1 tablet once daily at bedtime if needed 60 tablet 5  . azaTHIOprine (IMURAN) 50 MG tablet Take 100 mg by mouth 2 (two) times daily.    . Cyanocobalamin 1000 MCG/ML KIT Inject 1,000 mg as directed every 30 (thirty) days.    [name] diltiazem (CARDIZEM CD) 240 MG 24 hr capsule take 1 capsule by mouth once daily 90 capsule 3  . furosemide (LASIX) 20 MG tablet Take 20 mg by mouth as needed for edema.     . hydrocortisone (PROCTOSOL HC) 2.5 % rectal cream Place rectally 4 (four) times daily. 28.35  g 1  . losartan (COZAAR) 50 MG tablet Take 2 tablets (100 mg total) by mouth daily. 60 tablet 11  . metoprolol tartrate (LOPRESSOR) 25 MG tablet take 1 tablet by mouth twice a day 60 tablet 1  . NEOMYCIN-POLYMYXIN-HYDROCORTISONE (CORTISPORIN) 1 % SOLN otic solution Apply 1-2 drops to toe BID after soaking 10 mL 1  . ondansetron (ZOFRAN) 4 MG tablet Take 1 tablet (4 mg total) by mouth every 8 (eight) hours as needed for nausea or vomiting. 20 tablet 1  . pantoprazole (PROTONIX) 40 MG tablet Take 1 tablet (40 mg total) by mouth daily. 180 tablet 1  . predniSONE (DELTASONE) 20 MG tablet Take 60 mg by mouth daily.  0  . PREMPRO 0.3-1.5 MG tablet Take 1 tablet by mouth daily.  0  . sitaGLIPtin-metformin (JANUMET) 50-1000 MG tablet Take 1 tablet by mouth 2  (two) times daily with a meal.    . terbinafine (LAMISIL) 250 MG tablet Take one tablet once daily x 7 days, then repeat every month x 4 months 28 tablet 0  . valACYclovir (VALTREX) 500 MG tablet Take 500 mg by mouth daily.      No current facility-administered medications for this visit.    Allergies: Allergies  Allergen Reactions  . Actos [Pioglitazone]     REACTION: pt states INTOL w/ edema  . Codeine     REACTION: vomiting  . Januvia [Sitagliptin] Nausea Only  . Lactose Intolerance (Gi)   . Morphine Nausea Only    REACTION: vomiting  . Sitagliptin Phosphate     REACTION: nausea    Social History: The patient  reports that she has never smoked. She has never used smokeless tobacco. She reports that she does not drink alcohol or use illicit drugs.   Family History: The patient's family history is negative for Colon cancer, Esophageal cancer, Rectal cancer, and Stomach cancer. She was adopted.   Review of Systems: Please see the history of present illness.   Otherwise, the review of systems is positive for none.   All other systems are reviewed and negative.   Physical Exam: VS:  BP 144/90 mmHg  Pulse 75  Ht 5' 2"  (1.575 m)  Wt 214 lb (97.07 kg)  BMI 39.13 kg/m2 .  BMI Body mass index is 39.13 kg/(m^2).  Wt Readings from Last 3 Encounters:  03/26/16 214 lb (97.07 kg)  02/02/16 210 lb 9.6 oz (95.528 kg)  10/20/15 207 lb 6.4 oz (94.076 kg)    General: Pleasant. Obese black female who looks older than her stated age. She is alert and in no acute distress.  HEENT: Normal. Neck: Supple, no JVD, carotid bruits, or masses noted.  Cardiac: Regular rate and rhythm. Heart tones are distant. 2+ edema. Brawny stasis changes noted.  Respiratory:  Lungs are clear to auscultation bilaterally with normal work of breathing.  GI: Soft and nontender. Obese. MS: No deformity or atrophy. Gait and ROM intact. Skin: Warm and dry. Color is normal.  Neuro:  Strength and sensation are  intact and no gross focal deficits noted.  Psych: Alert, appropriate and with normal affect.   LABORATORY DATA:  EKG:  EKG is ordered today. This demonstrates NSR.  Lab Results  Component Value Date   WBC 8.5 02/02/2016   HGB 12.6 02/02/2016   HCT 37.3 02/02/2016   PLT 321.0 02/02/2016   GLUCOSE 137* 02/02/2016   CHOL 181 01/27/2015   TRIG 162.0* 01/27/2015   HDL 44.40 01/27/2015   LDLDIRECT 91.6 08/14/2014  LDLCALC 104* 01/27/2015   ALT 36* 01/27/2015   AST 43* 01/27/2015   NA 142 02/02/2016   K 4.4 02/02/2016   CL 103 02/02/2016   CREATININE 0.62 02/02/2016   BUN 12 02/02/2016   CO2 32 02/02/2016   TSH 1.62 02/13/2014   HGBA1C 6.7* 09/08/2011   MICROALBUR 3.5* 06/07/2011    BNP (last 3 results) No results for input(s): BNP in the last 8760 hours.  ProBNP (last 3 results) No results for input(s): PROBNP in the last 8760 hours.   Other Studies Reviewed Today:   Cardiac Cath from 2010 ASSESSMENT: 1. One-vessel branch vessel coronary artery disease, 90% small  diagonal 2, too small for percutaneous coronary intervention. 2. Normal left ventricular function. 3. Elevated left ventricular end-diastolic pressure consistent with  diastolic dysfunction.  PLAN: Start aspirin 81 mg a day. Start Imdur 30 mg a day. Change Norvasc to Cardizem CD 180 mg a day for diastolic dysfunction. We will check a fasting lipid panel to make sure her lipids are adequately controlled. She will not have her metformin for 48 hours and she will follow up with me in 2 weeks.     Fransico Him, M.D. Electronically Signed   Echo Study Conclusions from 2015  - Left ventricle: The cavity size was normal. Wall thickness was increased in a pattern of mild LVH. Systolic function was normal. The estimated ejection fraction was in the range of 60% to 65%. Wall motion was normal; there were no regional wall motion abnormalities. Doppler parameters are  consistent with abnormal left ventricular relaxation (grade 1 diastolic dysfunction).  Assessment/Plan: 1. Swelling - most likely from getting too much salt - needs to use support stockings. Discussed diet in depth.  Will let her use her lasix. Stop NSAID. Check echo. Has known diastolic dysfunction. She is on CCB - may need to address on return. I think she could really improve with life style changes.   2. Mild CAD - no active chest pain but lots of risk factors.   3. Obesity - needs to start working on this - discussed at length.   4. Sarcoid  5. DM - uncontrolled.   Current medicines are reviewed with the patient today.  The patient does not have concerns regarding medicines other than what has been noted above.  The following changes have been made:  See above.  Labs/ tests ordered today include:   No orders of the defined types were placed in this encounter.     Disposition:   FU with me in 4 weeks. See Dr. Irish Lack as planned in July. Lab today. Echo ordered.   Patient is agreeable to this plan and will call if any problems develop in the interim.   Signed: Burtis Junes, RN, ANP-C 03/26/2016 3:32 PM  [facility] [address] [place], [place]  [address] Phone: [phone] Fax: [phone]

## 2016-03-27 LAB — BRAIN NATRIURETIC PEPTIDE: Brain Natriuretic Peptide: 8 pg/mL (ref <100)

## 2016-03-27 LAB — BASIC METABOLIC PANEL
BUN: 13 mg/dL (ref 7–25)
CO2: 25 mmol/L (ref 20–31)
Calcium: 9.4 mg/dL (ref 8.6–10.4)
Chloride: 106 mmol/L (ref 98–110)
Creat: 0.7 mg/dL (ref 0.50–0.99)
Glucose, Bld: 125 mg/dL — ABNORMAL HIGH (ref 65–99)
Potassium: 4.1 mmol/L (ref 3.5–5.3)
Sodium: 140 mmol/L (ref 135–146)

## 2016-04-09 ENCOUNTER — Telehealth: Payer: Self-pay | Admitting: Pulmonary Disease

## 2016-04-09 ENCOUNTER — Other Ambulatory Visit: Payer: Self-pay

## 2016-04-09 ENCOUNTER — Other Ambulatory Visit: Payer: Self-pay | Admitting: Interventional Cardiology

## 2016-04-09 ENCOUNTER — Ambulatory Visit (HOSPITAL_COMMUNITY): Payer: BC Managed Care – PPO | Attending: Cardiology

## 2016-04-09 DIAGNOSIS — D869 Sarcoidosis, unspecified: Secondary | ICD-10-CM | POA: Insufficient documentation

## 2016-04-09 DIAGNOSIS — I119 Hypertensive heart disease without heart failure: Secondary | ICD-10-CM | POA: Diagnosis not present

## 2016-04-09 DIAGNOSIS — I340 Nonrheumatic mitral (valve) insufficiency: Secondary | ICD-10-CM | POA: Insufficient documentation

## 2016-04-09 DIAGNOSIS — E119 Type 2 diabetes mellitus without complications: Secondary | ICD-10-CM | POA: Diagnosis not present

## 2016-04-09 DIAGNOSIS — R0789 Other chest pain: Secondary | ICD-10-CM | POA: Insufficient documentation

## 2016-04-09 DIAGNOSIS — E7800 Pure hypercholesterolemia, unspecified: Secondary | ICD-10-CM | POA: Diagnosis not present

## 2016-04-09 DIAGNOSIS — G4733 Obstructive sleep apnea (adult) (pediatric): Secondary | ICD-10-CM | POA: Diagnosis not present

## 2016-04-09 MED ORDER — DOXYCYCLINE HYCLATE 100 MG PO TABS: 100.0000 mg | ORAL_TABLET | Freq: Two times a day (BID) | ORAL | Status: DC

## 2016-04-09 MED ORDER — PREDNISONE 10 MG PO TABS: ORAL_TABLET | ORAL | Status: DC

## 2016-04-09 NOTE — Telephone Encounter (Signed)
Can send script for prednisone 10 mg pill >> 3 pills daily for 2 days, 2 pills daily for 2 days, 1 pill daily for 2 days.  Please send script for doxycycline 100 mg bid for 7 days.

## 2016-04-09 NOTE — Telephone Encounter (Signed)
Spoke with pt. States that she has come down with possible bronchitis. Reports chest congestion, SOB, cough, wheezing and chest tightness. Cough is producing green mucus. Denies fever. Onset of symptoms started 2 days ago. Has been taking Mucinex with no relief. Would like to have something called in.  VS - please advise. Thanks.

## 2016-04-09 NOTE — Telephone Encounter (Signed)
Called spoke with pt. Reviewed VS's recs and verified pharmacy as [facility] on [address]. Pt voiced understanding and had no further questions. Nothing further needed.

## 2016-05-03 ENCOUNTER — Encounter: Payer: Self-pay | Admitting: Nurse Practitioner

## 2016-05-03 ENCOUNTER — Ambulatory Visit (INDEPENDENT_AMBULATORY_CARE_PROVIDER_SITE_OTHER): Payer: BC Managed Care – PPO | Admitting: Nurse Practitioner

## 2016-05-03 VITALS — BP 108/62 | HR 68 | Ht 62.0 in | Wt 201.0 lb

## 2016-05-03 DIAGNOSIS — E669 Obesity, unspecified: Secondary | ICD-10-CM

## 2016-05-03 DIAGNOSIS — R609 Edema, unspecified: Secondary | ICD-10-CM | POA: Diagnosis not present

## 2016-05-03 NOTE — Patient Instructions (Addendum)
We will be checking the following labs today - NONE   Medication Instructions:    Continue with your current medicines.     Testing/Procedures To Be Arranged:  N/A  Follow-Up:   See Dr. Irish Lack in July as planned    Other Special Instructions:   Congrats on all your hard work!!    If you need a refill on your cardiac medications before your next appointment, please call your pharmacy.   Call the [place] office at [phone] if you have any questions, problems or concerns.

## 2016-05-03 NOTE — Progress Notes (Signed)
CARDIOLOGY OFFICE NOTE  Date:  05/03/2016    Jocelyn [name] Date of Birth: Sep 21, 1956 Medical Record #[id number]  PCP:  Noralee Space, MD  Cardiologist:  Starr Lake    Chief Complaint  Patient presents with  . Edema    One month check - seen for Dr. Irish Lack    History of Present Illness: Jocelyn [name] is a 60 y.o. female who presents today for a one month check. Seen for Dr. Irish Lack.   She has a history of obesity, HLD, OSA, sarcoidosis, DM, fibromyalgia and CAD (noted as "mild branch disease and otherwise is undefined).   Had not been seen here since March of 2016. I saw her back in May with swelling and DOE. Lots of excess salt. Also taking NSAID  Comes in today. Here with her daughter again. She says she is "working hard". She is restricting her salt. Has stopped her NSAID. Swelling has improved. Has used the compression stockings. Successful with weight loss. Off Prednisone and getting some other type of therapy for her polymyalgia. Unfortunately, not able to exercise at this time based on recommendations from her rheumatologist - CK levels remain high already. No chest pain. Breathing has improved.   Past Medical History  Diagnosis Date  . Meniere's disease, unspecified   . Allergic rhinitis, cause unspecified   . Acute bronchitis   . Sarcoidosis ([place])   . Obstructive sleep apnea (adult) (pediatric)   . Essential hypertension, benign   . Coronary atherosclerosis of unspecified type of vessel, native or graft   . Unspecified venous (peripheral) insufficiency   . Pure hypercholesterolemia   . Type II or unspecified type diabetes mellitus without mention of complication, not stated as uncontrolled   . Irritable bowel syndrome   . Calculus of kidney   . Fibromyalgia   . Vitamin D deficiency   . Dizziness   . Anxiety   . Myalgia and myositis, unspecified   . Calculus of kidney   . BV (bacterial vaginosis) 06/22/1996  . H/O varicella   . Headache(784.0)      frequently  . Yeast infection   . Menses, irregular 2003  . Perimenopausal symptoms 2003  . Fibroid 2003  . H/O dysmenorrhea 2008  . HSV-2 infection 2009  . Vulvitis 2010  . B12 deficiency   . Anemia   . Hyperplastic colon polyp 05/16/2014    Past Surgical History  Procedure Laterality Date  . Tonsillectomy    . Heart catherization       Medications: Current Outpatient Prescriptions  Medication Sig Dispense Refill  . albuterol (PROVENTIL HFA;VENTOLIN HFA) 108 (90 BASE) MCG/ACT inhaler Inhale 1-2 puffs into the lungs every 6 (six) hours as needed for wheezing or shortness of breath. 8 g 2  . ALPRAZolam (XANAX) 0.5 MG tablet take 1/2 tablet by mouth IN THE MID-MORNING AND MID-AFTERNOON, and take 1 tablet once daily at bedtime if needed 60 tablet 5  . Cyanocobalamin 1000 MCG/ML KIT Inject 1,000 mg as directed every 30 (thirty) days.    [name] diltiazem (CARDIZEM CD) 240 MG 24 hr capsule take 1 capsule by mouth once daily 90 capsule 3  . furosemide (LASIX) 20 MG tablet Take 1 tablet (20 mg total) by mouth daily. 90 tablet 3  . losartan (COZAAR) 50 MG tablet Take 2 tablets (100 mg total) by mouth daily. 60 tablet 11  . metoprolol tartrate (LOPRESSOR) 25 MG tablet take 1 tablet by mouth twice a day 60 tablet 11  .  ondansetron (ZOFRAN) 4 MG tablet Take 1 tablet (4 mg total) by mouth every 8 (eight) hours as needed for nausea or vomiting. 20 tablet 1  . pantoprazole (PROTONIX) 40 MG tablet Take 1 tablet (40 mg total) by mouth daily. 180 tablet 1  . Saxagliptin-Metformin (KOMBIGLYZE XR) 03-999 MG TB24 Take by mouth daily with lunch.    . valACYclovir (VALTREX) 500 MG tablet Take 500 mg by mouth daily.     . NEOMYCIN-POLYMYXIN-HYDROCORTISONE (CORTISPORIN) 1 % SOLN otic solution Apply 1-2 drops to toe BID after soaking 10 mL 1   No current facility-administered medications for this visit.    Allergies: Allergies  Allergen Reactions  . Actos [Pioglitazone]     REACTION: pt states INTOL  w/ edema  . Codeine     REACTION: vomiting  . Januvia [Sitagliptin] Nausea Only  . Lactose Intolerance (Gi)   . Morphine Nausea Only    REACTION: vomiting  . Sitagliptin Phosphate     REACTION: nausea    Social History: The patient  reports that she has never smoked. She has never used smokeless tobacco. She reports that she does not drink alcohol or use illicit drugs.   Family History: The patient's family history is negative for Colon cancer, Esophageal cancer, Rectal cancer, and Stomach cancer. She was adopted.   Review of Systems: Please see the history of present illness.   Otherwise, the review of systems is positive for none.   All other systems are reviewed and negative.   Physical Exam: VS:  BP 108/62 mmHg  Pulse 68  Ht 5' 2"  (1.575 m)  Wt 201 lb (91.173 kg)  BMI 36.75 kg/m2 .  BMI Body mass index is 36.75 kg/(m^2).  Wt Readings from Last 3 Encounters:  05/03/16 201 lb (91.173 kg)  03/26/16 214 lb (97.07 kg)  02/02/16 210 lb 9.6 oz (95.528 kg)    General: Pleasant. Well developed, well nourished and in no acute distress. She has lost 13 pounds since last seen.  HEENT: Normal. Neck: Supple, no JVD, carotid bruits, or masses noted.  Cardiac: Regular rate and rhythm. No murmurs, rubs, or gallops. Trace edema. Has brawny stasis changes. Respiratory:  Lungs are clear to auscultation bilaterally with normal work of breathing.  GI: Soft and nontender.  MS: No deformity or atrophy. Gait and ROM intact. Skin: Warm and dry. Color is normal.  Neuro:  Strength and sensation are intact and no gross focal deficits noted.  Psych: Alert, appropriate and with normal affect.   LABORATORY DATA:  EKG:  EKG is not ordered today.  Lab Results  Component Value Date   WBC 6.2 03/26/2016   HGB 12.8 03/26/2016   HCT 37.5 03/26/2016   PLT 252 03/26/2016   GLUCOSE 125* 03/26/2016   CHOL 181 01/27/2015   TRIG 162.0* 01/27/2015   HDL 44.40 01/27/2015   LDLDIRECT 91.6 08/14/2014     LDLCALC 104* 01/27/2015   ALT 36* 01/27/2015   AST 43* 01/27/2015   NA 140 03/26/2016   K 4.1 03/26/2016   CL 106 03/26/2016   CREATININE 0.70 03/26/2016   BUN 13 03/26/2016   CO2 25 03/26/2016   TSH 1.62 02/13/2014   HGBA1C 6.7* 09/08/2011   MICROALBUR 3.5* 06/07/2011    BNP (last 3 results)  Recent Labs  03/26/16 1609  BNP 8.0    ProBNP (last 3 results) No results for input(s): PROBNP in the last 8760 hours.   Other Studies Reviewed Today:  Echo Study Conclusions from 03/2016  -  Left ventricle: The cavity size was normal. There was moderate  concentric hypertrophy. Systolic function was vigorous. The  estimated ejection fraction was in the range of 65% to 70%. Wall  motion was normal; there were no regional wall motion  abnormalities. Doppler parameters are consistent with abnormal  left ventricular relaxation (grade 1 diastolic dysfunction).  There was no evidence of elevated ventricular filling pressure by  Doppler parameters. - Aortic valve: Trileaflet; normal thickness leaflets. There was no  regurgitation. - Aortic root: The aortic root was normal in size. - Mitral valve: Structurally normal valve. There was trivial  regurgitation. - Right ventricle: The cavity size was normal. Wall thickness was  normal. Systolic function was normal. - Right atrium: The atrium was normal in size. - Tricuspid valve: There was no regurgitation. - Pulmonary arteries: Systolic pressure was within the normal  range. - Inferior vena cava: The vessel was normal in size. - Pericardium, extracardiac: There was no pericardial effusion.  Cardiac Cath from 2010 ASSESSMENT: 1. One-vessel branch vessel coronary artery disease, 90% small  diagonal 2, too small for percutaneous coronary intervention. 2. Normal left ventricular function. 3. Elevated left ventricular end-diastolic pressure consistent with  diastolic dysfunction.  PLAN: Start aspirin 81 mg  a day. Start Imdur 30 mg a day. Change Norvasc to Cardizem CD 180 mg a day for diastolic dysfunction. We will check a fasting lipid panel to make sure her lipids are adequately controlled. She will not have her metformin for 48 hours and she will follow up with me in 2 weeks.   Fransico Him, M.D. Electronically Signed   Assessment/Plan: 1. Swelling - improved with lifestyle changes.   2. Mild CAD - no active chest pain but lots of risk factors. Would continue with CV risk factor modification.   3. Obesity - now actively losing weight.   4. Sarcoid/PMR  5. DM - uncontrolled. Hopefully will improve with her dietary changes.   Current medicines are reviewed with the patient today.  The patient does not have concerns regarding medicines other than what has been noted above.  The following changes have been made:  See above.  Labs/ tests ordered today include:   No orders of the defined types were placed in this encounter.     Disposition:   FU with Dr. Irish Lack in July for her regular visit.   Patient is agreeable to this plan and will call if any problems develop in the interim.   Signed: Burtis Junes, RN, ANP-C 05/03/2016 10:42 AM  [facility] [address] [place], [place]  [address] Phone: [phone] Fax: [phone]

## 2016-05-06 ENCOUNTER — Other Ambulatory Visit: Payer: Self-pay | Admitting: Pulmonary Disease

## 2016-05-06 MED ORDER — ALPRAZOLAM 0.5 MG PO TABS: ORAL_TABLET | ORAL | Status: DC

## 2016-05-10 ENCOUNTER — Ambulatory Visit (INDEPENDENT_AMBULATORY_CARE_PROVIDER_SITE_OTHER): Payer: BC Managed Care – PPO | Admitting: Pulmonary Disease

## 2016-05-10 ENCOUNTER — Encounter: Payer: Self-pay | Admitting: Pulmonary Disease

## 2016-05-10 VITALS — BP 102/68 | HR 69 | Temp 98.7°F | Ht 62.0 in | Wt 198.2 lb

## 2016-05-10 DIAGNOSIS — J449 Chronic obstructive pulmonary disease, unspecified: Secondary | ICD-10-CM

## 2016-05-10 DIAGNOSIS — J45909 Unspecified asthma, uncomplicated: Secondary | ICD-10-CM

## 2016-05-10 DIAGNOSIS — I10 Essential (primary) hypertension: Secondary | ICD-10-CM | POA: Diagnosis not present

## 2016-05-10 DIAGNOSIS — Z862 Personal history of diseases of the blood and blood-forming organs and certain disorders involving the immune mechanism: Secondary | ICD-10-CM | POA: Diagnosis not present

## 2016-05-10 DIAGNOSIS — I872 Venous insufficiency (chronic) (peripheral): Secondary | ICD-10-CM

## 2016-05-10 DIAGNOSIS — I2510 Atherosclerotic heart disease of native coronary artery without angina pectoris: Secondary | ICD-10-CM

## 2016-05-10 DIAGNOSIS — M3320 Polymyositis, organ involvement unspecified: Secondary | ICD-10-CM

## 2016-05-10 DIAGNOSIS — G4733 Obstructive sleep apnea (adult) (pediatric): Secondary | ICD-10-CM

## 2016-05-10 DIAGNOSIS — J4489 Other specified chronic obstructive pulmonary disease: Secondary | ICD-10-CM | POA: Insufficient documentation

## 2016-05-10 NOTE — Patient Instructions (Signed)
Today we updated your med list in our EPIC system...    Continue your current medications the same...  For your allergies>>    Use an OTC Antihistamine like Claritin-10/ Zyrtek-10/ ALLEGRA-180 once daily...    Use the Saline nasal spray as needed for nasal congestion & drainage, but use the OTC FLONASE (Fluticasone) 2 sprays in each nostril at bedtime...  Continue your ADVAIR but try one puff each AM regularly  For your venous insuffic & edema>    NO SALT, elevate your legs when able, wear compression hose whern up 7 about...    Try the 15-20 mmHg compression stockings on-line from [facility] [employer]...  Call for any questions...  Let's plan a follow up visit in 86mo, sooner if needed for problems.Marland KitchenMarland Kitchen

## 2016-05-10 NOTE — Progress Notes (Signed)
Subjective:    Patient ID: Jocelyn [name], female    DOB: 1956/08/04, 60 y.o.   MRN: [id number]  HPI 60 y/o BF here for a follow up visit... she has multiple medical problems including:  AR & Asthma;  Hx Sarcoid;  Mild OSA;  HBP;  CAD;  Ven Insuffic;  Hyperchol;  DM;  GERD/ IBS;  Hx Kidney stones;  FM & DX w/ polymyositis/ Scleroderma overlap syndrome in 2016;  Vit D defic;  Anxiety... ~  SEE PREV EPIC NOTES FOR THE OLDER DATA >>    LABS 7/13 by [name]> FLP- at goals on Lip+Zetia;  Chems- wnl;  CBC- Hg=12.4, Ferritin low at 14.9, B12= 211; TSH=1.53;   December 04, 2012:  Jocelyn [name] has mult somatic complaints and they all seem to revolve around not resting well & aching/ sore/ tender in chest wall; we reviewed FM diagnosis which she has carried for yrs and prev saw [name]- Rec trial Lunesta 19mQhs & Tramadol Tid prn...      CXR 1/14 showed cardiomeg, clear lungs, no adenopathy, mild scoliosis...   CXR 4/15 showed mild cardiomeg, clear lungs, NAD...  LABS 4/15:  Chems- wnl;  CBC- ok w/ Hg=12.5 but Fe=31 (8%);  TSH=1.62;  ACE=56 (8-52);  BNP=26...  CXR 4/15 showed mild cardiomeg, clear lungs, NAD..Marland KitchenMarland Kitchen EKG 9/15 showed NSR, rate80, 1st degree AVB, otherw norm EKG...  2DEcho 9/15 showed mild LVH, norm LVF w/ EF=60-65%, norm wall motion, Gr1DD, norm valves w/ trivMR...   PFT 10/15 showed FVC=1.93 (79%), FEV1=1.55 (79%), %1sec=80%, mid-flows=70% predicted...   ~  March 13, 2015:  680moOV & Ettamae indicates that she's "hanging in there"; today c/o 1wk hx increased congestion w/ cough/ yellow-green sput, +f/c/s, SOB & wheezing she says; she started on OTC MUCINEX and took a ZPak she had on hand but only min better=> we decided to treat w/ Levaquin, Medrol dosepak, Mucinex & fluids...  She has had numerous subspecialty follow up visits over the last 5m45mo     She continues f/u w/ [name] for Cards> seen 3/16 for mild CAD in a branch vessel, exercise lim by knee arthritis, DOE w/ exertion, exam  was neg; rec to continue ASA81, Metop25Bid, CardizemCD240, Losar100, Lasix40; she is off Lipitor & CoQ10; she is concerned about just following her abn CPK values...      She continues to f/u w/ [name] for Endocrine> seen 10/15 for DM (A1c=6.3, Umicroalb=neg), left adrenal adenoma (12x14m55mstable, no change over time), B12 defic (level now>1500 on shots); on ASA81, KombiglyzeXR5-1000 one daily, B12 shots monthly;  She has also been evaluated by [facility], LaurAntonieta Iba "to learn how to eat healthy & lose weight"; she tries to exercise by [employer] (low impact)...      She had GI f/u w/ AE for [name]> she had EGD & Colon 7/15 (as below); presented c/o ext hem- Rx w/ sitz, baby wipes, benefiberAnusolHC...      Rheum- [name] (seen 11/15- note reviewed), Ortho- [name]> bilat knee pain, XRays showed arthritis, prev given shot & knee brace; Rheum thought her elev CPK was an "isolated elevated CPK" as her strength was 5/5, didn't have polymyositis, norm AST/ALT, etc; they agreed w/ the Dx of fibromyalgia & he rec Flexeril & Alprazolam... The CPK issue appears complicated [facility];  She is concerned due to other somatic complaints including right foot numb, & mult falls "I'm  clumsy & fall for no reason" => refer to Neuro for their eval...      We reviewed prob list, meds, xrays and labs> see below for updates >>   CXR showed mild cardiomeg & sl tortuous Ao, clear lungs, DDD of Tspine, NAD... IMP/ PLAN>>  She has a refractory URI/ bronchitis & we decided to treat w/ Levaquin5591m/d x7d, Align daily, and Medrol dosepak; she will continue her Mucinex600-2Bid + fluids;  She is still very concerned about the elev CPKs and not at all satisfied by [name]'s plan to just watch it, esp since it is rising she says; she has seen Rheum and no answer forthcoming so she would like to see Neurology for their opinion &  consultation=> [facility]  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 [name] manages her Endocrine system- DM, adrenal adenoma, B12 defic, VitD defic, Hx low Fe (seen last 03/2015 w/ A1c=6.3, Hg=12.8, Ferritin =19, B12=551, VitD=20... Since she has started on the Pred & MTX her CPK & aldolase enzymes have normalized & she has not fallen...     AR/ AB/ Hx Sarcoid> on Mucinex & MMW prn; denies cough, sputum, hemoptysis, or ch in dyspnea- she has SOB/DOE but is too sedentary & needs to incr exercise program; she requests a rescue inhaler for occas wheezing she encounters=> ProairHFA written for prn use...     HBP/ CAD> on ASA81, Toprol50-1/2Bid, Cardizem240, Cozaar50-2/d, Lasix20-2/d; BP=136/82 & she notes some chest discomfort & edema, denies palpit/ ch in SOB etc ; followed by [name]- [name], no recent notes.    CHOL> off Lipitor w/ hx elev CPK (on diet alone); [name] & she reports concern for "particle size" & last avail FLP was 3/16 showed TChol 181, TG 162, HDL 44, LDL 104    DM> followed by [name] on Kombiglyze => [name] follows her labs and his notes are reviewed...    GI> GERD, IBS> on Protonix40; denies abd pain, n/v, d/c, or blood seen; last colonoscopy was 7/03 by [name] and reported wnl; f/u due now esp in light of her low Ferritin level...    Polymyositis, HxFM> SEE ABOVE and notes from [name] & DrBeekman now on  Pred, MTX, & Folic...     Anxiety> on Xanax0.91m44mrn, she was INTOL to Zoloft & she stopped prev Lexapro Rx...    Vit D defic> she tells me that [name] treated her low VitD w/ 50K weekly, then switched to daily OTC supplement...    Vit B12 defic> [name] has her on B12 shots (weekly x4, then monthly) for B12 level done 8/14 = 154 & f/u level >1500 on the shots...    Borderline anemia & low Ferritin> she was treated w/ Fe from [name] & improved... EXAM shows Afeb, VSS, O2sat=96% on RA;  HEENT- neg, mallampati2;  Chest- decr BS but clear w/o w/r/r;  Heart- RR  gr1/6 SEM w/o r/g;  Abd- soft, neg;  Ext- VI, tr edema... IMP/PLAN>>  Jocelyn [name] has been diagnosed w/ polymyositis & started on Pred+MTX by Glean Salen;  She will continue Rx from him, add a Probiotic daily to aide digestion & we wrote for a rescue inhaler per her request...   ~  October 20, 2015:  50moROV & add-on appt requested for persistent URI symptoms>  Cough, congestion, wheezing, some yellow mucus, assoc w/ chills & sweats (no fever reported)- "I feel awful", and she's already on Pred from Rheum for her PM/scleroderma overlap syndrome; ZPak called in & no better she says, has Proventil-HFA for prn use;  See prob list above...    EXAM shows Afeb, VSS, O2sat=96% on RA;  HEENT- neg, mallampati3;  Chest- decr BS & few bibasilar rhonchi w/ end-exp wheezing;  Heart- RR gr1/6 SEM w/o r/g;  Abd- soft, neg;  Ext- VI, tr edema. IMP/PLAN>>  We don't want to have to incr Pred- given Depo80, adding Levaquin500 x7d, ADVAIR100Bid, Mucinex600-2Bid + fluids...  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  Rheum recently stopped her PRED & MTX in favor of  IMURAN (we do not have recent notes)...    AR/ AB/ Hx Sarcoid> on Mucinex & Albut-HFA prn; recently w/ recurrent URIs=> refractory AB that's been hard to shake- she has baseline SOB/DOE & is too sedentary & needs to incr exercise program; treated 10/2015 w/ refractoryAB & adding Advair100Bid helped (pt stopped on her own)...     EXAM shows Afeb, VSS, O2sat=96% on RA; Wt=211#, 5'2"Tall, BMI=38; Heent- neg, mallampati3; Chest- decr BS & few bibasilar rhonchi w/ end-exp wheezing; Heart- RR gr1/6 SEM w/o r/g; Abd- soft, neg; Ext- VI, tr edema...  CXR 02/02/16> cardiomeg, mild vasc prom, no focal infiltrate/ NAD....  LABS 02/02/16>  Chems- wnl x BS=137;  CBC- wnl w/ Hg=12.6, WBC=8.5...Marland KitchenMarland KitchenMP/PLAN>>  We decided to Rx w/ Depo80, Medrol8m2md taper sched, Augmentin875Bid & Align; she knows to continue Mucinex600-2Bid, fluids, & rescue inhaler prn, may need the Advair restarted but holding off for now.   ~  May 10, 2016:  94mo70mo & she tells me that Rheum is trying ACTHAR for her polymyositis, instead of Pred, but the copay is $2000 per vial (we don't have recent notes from Rheum);  She notes her breathing is about the same- SOB/DOE off & on w/ activities, min cough, small amt whitish sput, no hemoptysis, ?sl tightness but no CP etc...     AR/ AB/ Hx Sarcoid> on Mucinex & Albut-HFA prn; hx refractory AB that's been hard to shake- she has baseline SOB/DOE & is too sedentary & needs to incr exercise program; treated 10/2015 w/ refractoryAB & adding Advair100Bid helped (pt stopped on her own)...     HBP/ CAD> on ASA81, Toprol50-1/2Bid, Cardizem240, Cozaar50-2/d, Lasix20/d; BP=110/68 & she notes some chest discomfort & edema, denies palpit/ ch in SOB etc ; followed by EaglSadie Haberds- [name]/ TTurner et al & 05/03/16 note reviewed=> edema improved, weight down to 201#, 2DEcho 5/17 showed concLVH, norm LVF w/ EF=65-70%, norm wall motion, Gr1DD, valves ok, PAsys ok; theymade several changes to her meds...    CHOL> off  Lipitor w/  hx elev CPK (on diet alone); FLP is followed by [facility] per pt & she reports concern for "particle size" & last avail FLP was 3/16 showed TChol 181, TG 162, HDL 44, LDL 104; but FLP 09/2015 by [name] showed TChol 291, TG 179, HDL 55, LDL 200...    DM> followed by [name] on Kombiglyze => [name] follows her labs and his notes are reviewed=> 04/2016 A1c=7.2.Marland KitchenMarland Kitchen EXAM shows Afeb, VSS, O2sat=96% on RA; Wt=198#, 5'2"Tall, BMI=36; Heent- neg, mallampati3; Chest- decr BS & few bibasilar rhonchi w/ end-exp wheezing; Heart- RR gr1/6 SEM w/o r/g; Abd- soft, neg; Ext- VI, tr edema... IMP/PLAN>>  We reviewed allergy Rx w/ antihist, Flonase, Saline; reminded to use her AdvairBid; fir her VI/edema- no salt, elevation, compression hose, etc; keep up the good work w/ wt reduction, ROV in 7mo...          Problems List:  MENIERE'S DISEASE (ICD-3 - eval by ENT, DrBates- Rx w/ diuretic, low salt, Xanax vs Valium, & Antivert Prn... dizziness recurred & [facility] we don't have notes but pt reports that nothing was found.  ALLERGIC RHINITIS - increased allergy symptoms in the spring... rec> Claritin, Saline, Flonase...  Hx of ASTHMATIC BRONCHITIS, ACUTE  - requires occas Antibiotics and Medrol for infectious exac- last Oct09 & resolved w/ Avelox/ Depo/ Medrol... has not been on regular inhalers, but uses XOPENEX MDI (w/ spacer), MCecilPrn... ~  essentially neg CTChest 9/08 after CXR suggested RULnodule. ~  CXR 10/09 showed cardiomegaly, clear lungs, min scoliosis, NAD. ~  CXR 9/11 showed cardiomeg, clear lungs, NAD..[name] ~  CXR 1/14 showed cardiomeg, clear lungs, no adenopathy, mild scoliosis... ~  CXR 4/15 showed mild cardiomeg, clear lungs, NAD... ~  PFT 10/15 showed FVC=1.93 (79%), FEV1=1.55 (79%), %1sec=80%, mid-flows=70% predicted...  ~  CXR showed mild cardiomeg & sl tortuous Ao, clear lungs, DDD of Tspine, NAD... ~  10/16:  We wrote for an AlbutHFA inhaler for prn use at  her request for wheezing... ~  12/16: presents w/ refractory AB- given Levaquin, already on Pred per Rheum, added Advair100Bid + Mucinex600-2Bid... ~  02/02/16: another bout of refractory AB- treated w/ Depo, Medrol taper, Augmentin, Mucinex, Proventil...  Hx of SARCOIDOSIS  - Dx'd 1980's w/ bronch bx... Rx Pred transiently and improved... no active disease x years... ~  baseline CXRs showed cardiomegaly, clear lungs, min scoliosis, NAD.  Hx of Mild OBSTRUCTIVE SLEEP APNEA - sleep eval DrClance 2006 w/ RDI= 5 only.   [facility] >>   HYPERTENSION, BENIGN  >> followeed by [name] now for CARDS...  ~  on TOPROL XL 530m 1.5 tabs daily, CARDIZEM 24038m, LASIX 58m72mst as needed now... ~  prev OV's 130-140's / 80's... prev noted sl HA, chest tightness, sl SOB, & mult somatic complaints... Labs & renal- all WNL. ~  4/12:  BP=122/76 today, and similar at home per pt... She denies CP, palpit, SOB, edema, etc... Labs & renal- all WNL. ~  1/14: on ASA81, Imdur120, ToprolXL75, Cardizem240, Lasix20prn (seldom takes); BP=128/80 & she has mult somatic c/o- CWP, tired/no energy, but denies palpit, ch in SOB, edema, etc; followed by EaglSadie Haberds- DrTurner/ [name] & seen recently but we don't have note; they prev added RANEXA 500mg28m for her CP (now off this med); we discussed trial Lunesta 2mgQh60mor sleep & Tramadol 50mgTi72mr pain... ~  9/14:  on ASA81, Imdur120, Toprol50-1/2Bid, Cardizem240, Lasix20prn (seldom takes); BP=132/74 & she has  mult somatic c/o- CWP, tired/no energy, but denies palpit, ch in SOB, edema, etc; followed by Sadie Haber cards- DrTurner/ [name], no recent notes ~  4/15: on ASA81, Toprol50-1/2Bid, Cardizem240, Lasix20prn (ave 1/d) & ?Amlodip5 from [employer]?; BP=124/76 & she has mult somatic c/o- CWP, tired/no energy, but denies palpit, ch in SOB, edema, etc; followed by Sadie Haber cards- [name], no recent notes. ~  4/16: on ASA81, Metop25Bid, CardizemCD240,  Losar100, Lasix40; BP= 146/88 and reminded to elim sodium & get weight down...  CORONARY ARTERY DISEASE - on ASA 61m/d, IMDUR 1243md, Toprol XL, Cardizem, Lasix as above... Followed by DrTTurner. ~  Cath in 2000 showed non-obstructive CAD (20-30% lesions in all 3 vessels) w/ rec for risk factor modification.   ~  NuclearStressTest 4/07 showed no ischemia & EF=73%. ~  Mar10:  she's concerned about BP and chest tightness, requests Cardiac eval DrTurner & we will refer... ~  2DEcho 4/10 showed norm LVF w/ EF=55-60%, norm MV, norm AoV, Gr1DD ~  4/10 eval by DrTurner w/ MYOVIEW- neg- breast attenuation, no regional wall motion abn, EF= 75% ~  4/10 Cath by DrTurner w/ 90% small 2nd diag branch of LAD <too sm for PTCA> & luminal irreg up to 20% in RCA, +DD- Med Rx. ~  She had more chest tightness & another Myoview from DrTurner 8/12- it too was neg, no ischemia, no infarct; +breast attenuation; EF=70% & nno regional wall motion abnormalities;  IMDUR has been incr to 12051m. ~  1/14: [facility] have notes from [facility]> EKG sent showed NSR, rate72, wnl, NAD... ~  EKG 5/14 showed NSR, rate 93, WNL, NAD... ~  12/14: she saw [name] for Cards f/u (note reviewed)> HBP, CAD, Chol; off Imdur w/o angina, continue same meds including both Cardizem & Amlodipine, encouraged to continue [employer] study... ~  She continues regular f/u visits w/ [name], CARDS> his notes are reviewed...  VENOUS INSUFFICIENCY - [facility] DrWWoods and seen by DrSevier 6/08= ven insuffic Rx w/ TED's, no salt, elevation, etc. ~  10/10: notes bilat ankle ?lipoma in front of the lat malleoli- asymptomatic but several people have noticed them and she request refer to Ortho (rec DrBednarz when she is ready).  HYPERCHOLESTEROLEMIA  - on LIPITOR 38m36m& ZETIA 10mg78m CoQ10 per DrTurner... Diet & exercise stressed to the pt. ~  FLP here 12/07 on Lip10 showed  TChol 156, TG 94, HDL 51, LDL 86 ~  FLP 1/09 on Lip10 showed TChol 157, TG 122, HDL 49, LDL 84 ~  [facility] showed TChol 176, TG 116, HDL 55, LDL 98 ~  FLP 3/10 on Lip10 showed TChol 170, TG 94, HDL 59, LDL 93 ~  FLP 6/10 by DrTurner on Lip20 showed TChol 134, TG 82, HDL 55, LDL 73 ~  FLP 4/11 here on Lip20+Zetia? showed TChol 145, TG 74, HDL 68, LDL 63 ~  9/11:  she reports that DrTurner incr Lipitor to 38mg,52ms the Zetia 10mg, 95mshe does her FLPs. ~  FLP here 4/12 on Lip40+Zetia showed TChol 153, TG 54, HDL 53, LDL 90... Copy sent to DrTurner ~  FLP by [place] 7/13 on Lip40+Zetia10 showed TChol 137, TG 89, HDL 42, LDL 73 ~  9/14: on Lip40 => notes from Kerr saPortiaLip80 +PharmQuest study drug?; she has gained 4# to 190# today; [facility] & she reports  concern for "particle size" & last avail FLP was 7/14 by [name] w/ TChol 190, HDL 100, LDL 40 ~  FLP followed by [name] for CARDS, and [name] for Endocrine...   FOLLOWED by [name] FOR ENDOCRINOLOGY >>   DIABETES MELLITUS  - on Metformin 566mBid + PARLODEL 2.566md per DrEllison... She is intol to Actos w/ edema, she stopped Januvia due to ?side effect, she wondered about Tajenta since her daugh works for [employer]~  labs 9/08 showed FBS=119, HgA1c=6.6 ~  [facility] BS= 108, HgA1c= 6.5 ~  labs 3/10 showed BS= 113, A1c= 6.6 ~  labs 6/10 by DrTurner showed BS= 92; and 3/11 BS= 127 ~  labs 4/11 showed BS= 124, A1c= 7.6...Marland Kitchenrec> incr Metform to Bid; [facility]... ~  6/11:  pt requested Endocrine consult & seen by DrEllison w/ Januvia 10023m added. ~  labs 9/11 showed BS= 98, A1c= 7.3... Marland KitchenMarland Kitchene stopped Januvia due to side effects & wonders about using Tajenta instead. ~  DrEllison started Bro[employer]5mg50m.. She is now taking this +Metform 500mg26m.. ~  Labs 4/12 (wt=195#) showed Bs= 116, A1c= 7.1 ~  Labs 7/12 showed A1c= 7.2;  Umicroalb=  3.5 ~  Labs 10/12 (wt=186#) showed A1c= 6.7 NOTE: She participated in a [facility] trial called the AfricanAmerican- Diabetes Heart Study in 2012; they sent a report indicating: 1) her memory was fine, but her MRI Brain showed sm vessel dis & mild sinus dis, otherw neg; 2) she may have treatable depression but we tried her on Zoloft & she was intol==> ch to Lexapro. ~  followed by [name] on Metform500Bid & Onglyza2.5; [place] showed BS=201, A1c=6.0; he rec same meds... ~  9/14: followed by [name] on Metform500Bid & Onglyza2.5 => ?he changed to Kombiglyze5-1000 daily?; last labs from DrKerPyote showed BS=105, A1c=6.5; and she remains on the same meds... ~  1/15: she had f/u [name] for Endocrine> DM, Obesity, Adrenal adenoma, VitB12 defic; labs reviewed, note reviewed- he tried Belviq but she was intol... ~  She continues regular follow up w/ [name]...   FOLLOWED by [name] for GASTORENTEROLOGY >>   GERD SYMPTOMS - pt placed on PROTONIX 40mg/56m improvement in reflux symptoms...    ADDENDUM>> note from [name] indicates> upper endoscopy in July 1994 showed antral gastritis. 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  IRRITABLE BOWEL SYNDROME  - colonoscopy 7/03 by [name] was WNL. ~  9/14: she was referred to GI for f/u colon but never did it... ~  4/15: we will refer her to GI again...  RENAL CALCULUS  - sm stone seen in L kidney on CTScan... [facility]  POLYMYOSITIS >> now followed by DrBeekman FIBROMYALGIA  - prev Rx by [name] in 2006... ~  1/14: This is her CC w/ not resting well, wakes tired, poor energy, aching/ sore/ tender (esp in chest in AM) & we discussed trial Lunesta 2mgQhs60m Tramadol 50mgTid80mus rest, heat, etc... States she's INTOL Ambien w/ weird dreams & Benedryl/ Melatonin w/o help... ~  11/14: she went to the ER w/ LBP> felt to be a lumbar strain... ~  She was seen by [name] for  Rheum [id number]> Jocelyn [name] in Neuro- felt to have a myopathy and labs showed elev CPK (as hi as 1860, and a pos PM-Scl 100 antibody;  Clinically she had musc aches and some falling episodes, some numbness &  tingling, no skin changes;  EMG/NCV was performed (most consistent with a non-necrotizing myopathy affecting the proximal leg muscles)- she was referred to [facility], Rheum who felt she has polymyositis or a PM/scleroderma overlap syndrome, he wanted to get a musc bx but decided to start Rx rather than wait & she is now on Pred & MTX w/ Folic  VITAMIN D DEFICIENCY - Vit D level was 30 at [facility] study in fall 2009... supplemented w/ OTC Vit D 1000 u daily. ~  Vit D level was lower from [name] & he treated w/ Vit D 50K weekly for awhile, then switched to OTC supplement...  ANXIETY - on ALPRAZOLAM 0.27m Prn... DEPRESSION - prev on Lexapro 249md but she stopped on her own ~10/13 ?cost issues? ~  4/12: c/o feeling sad & anxious all the time- weak, not resting well, under a lot of stress; rec to seek counselling thru her local church or let usKoreaefer [facility] start RX w/ Zoloft 50==> but she was INTOL... ~  2013: switched to Lexapro & seems to be doing better but didn't stick w/ it due to cost issues...  ANEMIA & LOW FERRITIN LEVEL >>  B12 DEFICIENCY >> treated by [name] w/ B12 shots ~  9/14: she tells me that Ferritin is low & [name] tried her on Oral Fe prep w/o improvement & wants me to f/u and treat this problem; we discussed need to recheck labs here- LABS 9/14 showed Hg=12.5, MCV=88, Fe=47 (12%sat), Ferritin=10.8; she is due for f/u colon w/ [name] & we will refer, rec to start FeSO4 32519mid w/ VitC500 w/ plans for f/u CBC, Fe studies in 2-58mo48mo 4/15:  Labs showed Hg= 12.5 but Fe=31 (8%); she was only taking the Fe once daily 7 advised to incr to Bid w/ vitC500; needs f/u colon & we will refer to [name] again...  HEALTH MAINTENANCE: ~  GI:  Followed by [name]  & colon due 7/13... ~  GYN:  Followed by DrDillard & she gets yearly PAP, Mammogram, hasn't had baseline BMD yet, [facility]... ~  Immunizations:  She gets the yearly Flu shots at school;  Had PNEUMOVAX previously; TDAP given 4/12...   No past surgical history on file - other than Lithotripsy for renal stone...   Outpatient Encounter Prescriptions as of 05/10/2016  Medication Sig  . albuterol (PROVENTIL HFA;VENTOLIN HFA) 108 (90 BASE) MCG/ACT inhaler Inhale 1-2 puffs into the lungs every 6 (six) hours as needed for wheezing or shortness of breath.  . ALPRAZolam (XANAX) 0.5 MG tablet take 1/2 tablet by mouth IN THE MID-MORNING AND MID-AFTERNOON, and take 1 tablet once daily at bedtime if needed  . Cyanocobalamin 1000 MCG/ML KIT Inject 1,000 mg as directed every 30 (thirty) days.  . diMarland Kitchentiazem (CARDIZEM CD) 240 MG 24 hr capsule take 1 capsule by mouth once daily  . furosemide (LASIX) 20 MG tablet Take 1 tablet (20 mg total) by mouth daily.  . loMarland Kitchenartan (COZAAR) 50 MG tablet Take 2 tablets (100 mg total) by mouth daily.  . metoprolol tartrate (LOPRESSOR) 25 MG tablet take 1 tablet by mouth twice a day  . NEOMYCIN-POLYMYXIN-HYDROCORTISONE (CORTISPORIN) 1 % SOLN otic solution Apply 1-2 drops to toe BID after soaking  . ondansetron (ZOFRAN) 4 MG tablet Take 1 tablet (4 mg total) by mouth every 8 (eight) hours as needed for nausea or vomiting.  . pantoprazole (PROTONIX) 40 MG tablet Take 1 tablet (40 mg total) by mouth daily.  .[name]  Saxagliptin-Metformin (KOMBIGLYZE XR) 03-999 MG TB24 Take by mouth daily with lunch.  . valACYclovir (VALTREX) 500 MG tablet Take 500 mg by mouth daily.   [name] PREMPRO 0.3-1.5 MG tablet Take 1 tablet by mouth daily.   No facility-administered encounter medications on file as of 05/10/2016.    Allergies  Allergen Reactions  . Actos [Pioglitazone]     REACTION: pt states INTOL w/ edema  . Codeine     REACTION: vomiting  . Januvia [Sitagliptin] Nausea Only  .  Lactose Intolerance (Gi)   . Morphine Nausea Only    REACTION: vomiting  . Sitagliptin Phosphate     REACTION: nausea    Current Medications, Allergies, Past Medical History, Past Surgical History, Family History, and Social History were reviewed in [employer] record.   Review of Systems        See HPI - all other systems neg except as noted...      The patient complains of weight gain, dyspnea on exertion, and peripheral edema.  The patient denies anorexia, fever, weight loss, vision loss, decreased hearing, hoarseness, chest pain, syncope, prolonged cough, headaches, hemoptysis, abdominal pain, melena, hematochezia, severe indigestion/heartburn, hematuria, incontinence, genital sores, suspicious skin lesions, transient blindness, difficulty walking, depression, unusual weight change, abnormal bleeding, enlarged lymph nodes, and angioedema.     Objective:   Physical Exam     WD, Overweight, 60 y/o BF in NAD... GENERAL:  Alert & oriented; pleasant & cooperative. HEENT:  Roy/AT, EOM-wnl, PERRLA, EACs-clear, TMs-wnl, NOSE-clear, THROAT-clear & wnl. NECK:  Supple w/ fairROM; no JVD; normal carotid impulses w/o bruits; no thyromegaly or nodules palpated; no lymphadenopathy. CHEST:  decrBS at bases, few scar rhonchi & end-exp wheezing, no rales, no consolidation... HEART:  Regular Rhythm; without murmurs/ rubs/ or gallops. ABDOMEN:  Soft & nontender; normal bowel sounds; no organomegaly or masses detected. EXT: without deformities, mild arthritic changes; no varicose veins but +venous insuffic & tr edema; +trigger points. NEURO:  CN's intact;  no focal neuro deficits... DERM:  No lesions noted; no rash etc...  RADIOLOGY DATA:  Reviewed in the EPIC EMR & discussed w/ the patient...  LABORATORY DATA:  Reviewed in the EPIC EMR & discussed w/ the patient...   Assessment & Plan:    RESP>  Hx AR, HxAsthma, old sarcoid, mild OSA>  All stable, breathing OK, notes some  wheezing w/ activity; exam reveals some secretions in the airway & rec to take MUCINEX 1294m bid, Fluids, etc;. CXR remains stable/ NAD... 10/20/15>  Presented w/ refractory AB- already on Pred- given Depo80, adding Levaquin500 x7d, ADVAIR100Bid, Mucinex600-2Bid + fluids... 02/02/16>  Presents w/ another refractory AB- currently off Pred & MTX, on Imuran; treated w/ Depo, Medrol taper, Augmentin, Mucinex, Albut=HFA...  04/2016>  Back to baseline   CARDS>  Followed by DrTTurner/ VRica Mote?on meds listed? SHE DID NOT BRING MEDS TO THE OV> Hx HBP, CAD, VI, etc;  Chest pain is non-cardiac & likely related to her FM; Doing satis & we reviewed exercise program etc...  CH[facility]>  [facility] off [facility] + off Zetia; ? [place]  DM>  Followed by [name] on Metformin, Onglyzal; A1c improved to 6.7 in 2012, then 6.0 in 2013, & 6.5  In 2014;  rec continue meds & [place]  GI>  Stable on Protonix as needed; see GI eval from [name] 7/15...  POLYMYOSITIS/ FM>  Diagnoses w/ Polymyositis recently (2016) &  started on Pred, MTX, folate by DrBeekman...  Anxiety>  She remains on Alprazolam as needed; & she stopped the Lexapro..  Anemia, Low Ferritin> on FeSO4 35m & VitC 500...   B12 Defic> on B12 shots per [name]...   Patient's Medications  New Prescriptions   No medications on file  Previous Medications   ALBUTEROL (PROVENTIL HFA;VENTOLIN HFA) 108 (90 BASE) MCG/ACT INHALER    Inhale 1-2 puffs into the lungs every 6 (six) hours as needed for wheezing or shortness of breath.   ALPRAZOLAM (XANAX) 0.5 MG TABLET    take 1/2 tablet by mouth IN THE MID-MORNING AND MID-AFTERNOON, and take 1 tablet once daily at bedtime if needed   CYANOCOBALAMIN 1000 MCG/ML KIT    Inject 1,000 mg as directed every 30 (thirty) days.   DILTIAZEM (CARDIZEM CD) 240 MG 24 HR CAPSULE    take 1 capsule by mouth once daily   FUROSEMIDE (LASIX) 20 MG TABLET     Take 1 tablet (20 mg total) by mouth daily.   LOSARTAN (COZAAR) 50 MG TABLET    Take 2 tablets (100 mg total) by mouth daily.   METOPROLOL TARTRATE (LOPRESSOR) 25 MG TABLET    take 1 tablet by mouth twice a day   NEOMYCIN-POLYMYXIN-HYDROCORTISONE (CORTISPORIN) 1 % SOLN OTIC SOLUTION    Apply 1-2 drops to toe BID after soaking   ONDANSETRON (ZOFRAN) 4 MG TABLET    Take 1 tablet (4 mg total) by mouth every 8 (eight) hours as needed for nausea or vomiting.   PANTOPRAZOLE (PROTONIX) 40 MG TABLET    Take 1 tablet (40 mg total) by mouth daily.   PREMPRO 0.3-1.5 MG TABLET    Take 1 tablet by mouth daily.   SAXAGLIPTIN-METFORMIN (KOMBIGLYZE XR) 03-999 MG TB24    Take by mouth daily with lunch.   VALACYCLOVIR (VALTREX) 500 MG TABLET    Take 500 mg by mouth daily.   Modified Medications   No medications on file  Discontinued Medications   No medications on file

## 2016-06-01 ENCOUNTER — Ambulatory Visit (INDEPENDENT_AMBULATORY_CARE_PROVIDER_SITE_OTHER): Payer: BC Managed Care – PPO | Admitting: Interventional Cardiology

## 2016-06-01 ENCOUNTER — Encounter: Payer: Self-pay | Admitting: Interventional Cardiology

## 2016-06-01 VITALS — BP 110/60 | HR 80 | Ht 62.0 in | Wt 194.0 lb

## 2016-06-01 DIAGNOSIS — I2510 Atherosclerotic heart disease of native coronary artery without angina pectoris: Secondary | ICD-10-CM | POA: Diagnosis not present

## 2016-06-01 DIAGNOSIS — R600 Localized edema: Secondary | ICD-10-CM

## 2016-06-01 DIAGNOSIS — I10 Essential (primary) hypertension: Secondary | ICD-10-CM | POA: Diagnosis not present

## 2016-06-01 DIAGNOSIS — E7800 Pure hypercholesterolemia, unspecified: Secondary | ICD-10-CM | POA: Diagnosis not present

## 2016-06-01 LAB — COMPREHENSIVE METABOLIC PANEL
ALBUMIN: 4.1 g/dL (ref 3.6–5.1)
ALT: 21 U/L (ref 6–29)
AST: 20 U/L (ref 10–35)
Alkaline Phosphatase: 65 U/L (ref 33–130)
BUN: 18 mg/dL (ref 7–25)
CALCIUM: 9.6 mg/dL (ref 8.6–10.4)
CHLORIDE: 103 mmol/L (ref 98–110)
CO2: 26 mmol/L (ref 20–31)
Creat: 0.83 mg/dL (ref 0.50–0.99)
Glucose, Bld: 152 mg/dL — ABNORMAL HIGH (ref 65–99)
POTASSIUM: 4.4 mmol/L (ref 3.5–5.3)
SODIUM: 143 mmol/L (ref 135–146)
Total Bilirubin: 0.4 mg/dL (ref 0.2–1.2)
Total Protein: 6.9 g/dL (ref 6.1–8.1)

## 2016-06-01 LAB — LIPID PANEL
CHOL/HDL RATIO: 5.6 ratio — AB (ref <=5.0)
Cholesterol: 296 mg/dL — ABNORMAL HIGH (ref 125–200)
HDL: 53 mg/dL (ref >=46)
LDL CALC: 202 mg/dL — AB (ref <130)
TRIGLYCERIDES: 205 mg/dL — AB (ref <150)
VLDL: 41 mg/dL — AB (ref <30)

## 2016-06-01 NOTE — Patient Instructions (Signed)
Medication Instructions:  Same-no changes  Labwork: Today-Lipids and CMET  Testing/Procedures: None  Follow-Up: Your physician wants you to follow-up in: 6 months. You will receive a reminder letter in the mail two months in advance. If you don't receive a letter, please call our office to schedule the follow-up appointment.     If you need a refill on your cardiac medications before your next appointment, please call your pharmacy.

## 2016-06-01 NOTE — Progress Notes (Signed)
Cardiology Office Note   Date:  06/01/2016   ID:  Jocelyn [name], DOB September 05, 1956, MRN [id number]  PCP:  Noralee Space, MD    No chief complaint on file. CAD   Wt Readings from Last 3 Encounters:  06/01/16 194 lb (87.998 kg)  05/10/16 198 lb 3.2 oz (89.903 kg)  05/03/16 201 lb (91.173 kg)       History of Present Illness: Jocelyn [name] is a 60 y.o. female  who has had mild CAD in a branch vessel. She has not used NTG recently. Exercise limited by knee problem. No longer going to [facility].    She was diagnosed with polymyositis. SHe is gettting injections.  HEr CK was increased.  She has also taken prednisone as well.    She is a Automotive engineer.  No chest pain.  She has not used NTG.    Intolerant of statins.  SHe was in a study through [employer] that was stopped early.      Past Medical History  Diagnosis Date  . Meniere's disease, unspecified   . Allergic rhinitis, cause unspecified   . Acute bronchitis   . Sarcoidosis ([place])   . Obstructive sleep apnea (adult) (pediatric)   . Essential hypertension, benign   . Coronary atherosclerosis of unspecified type of vessel, native or graft   . Unspecified venous (peripheral) insufficiency   . Pure hypercholesterolemia   . Type II or unspecified type diabetes mellitus without mention of complication, not stated as uncontrolled   . Irritable bowel syndrome   . Calculus of kidney   . Fibromyalgia   . Vitamin D deficiency   . Dizziness   . Anxiety   . Myalgia and myositis, unspecified   . Calculus of kidney   . BV (bacterial vaginosis) 06/22/1996  . H/O varicella   . Headache(784.0)     frequently  . Yeast infection   . Menses, irregular 2003  . Perimenopausal symptoms 2003  . Fibroid 2003  . H/O dysmenorrhea 2008  . HSV-2 infection 2009  . Vulvitis 2010  . B12 deficiency   . Anemia   . Hyperplastic colon polyp 05/16/2014    Past Surgical History  Procedure Laterality Date  . Tonsillectomy    . Heart  catherization       Current Outpatient Prescriptions  Medication Sig Dispense Refill  . albuterol (PROVENTIL HFA;VENTOLIN HFA) 108 (90 BASE) MCG/ACT inhaler Inhale 1-2 puffs into the lungs every 6 (six) hours as needed for wheezing or shortness of breath. 8 g 2  . ALPRAZolam (XANAX) 0.5 MG tablet Take 0.5 mg by mouth at bedtime as needed for anxiety or sleep (take 1/2 tablet by mouth IN THE MID-MORNING AND MID-AFTERNOON, and take 1 tablet once daily at bedtime if needed).    . Cyanocobalamin 1000 MCG/ML KIT Inject 1,000 mg as directed every 30 (thirty) days.    [name] diltiazem (CARDIZEM CD) 240 MG 24 hr capsule take 1 capsule by mouth once daily 90 capsule 3  . furosemide (LASIX) 20 MG tablet Take 1 tablet (20 mg total) by mouth daily. 90 tablet 3  . HP ACTHAR 80 UNIT/ML injectable gel Take 80 Units by mouth 3 (three) times a week.     . losartan (COZAAR) 50 MG tablet Take 2 tablets (100 mg total) by mouth daily. 60 tablet 11  . metoprolol tartrate (LOPRESSOR) 25 MG tablet take 1 tablet by mouth twice a day 60 tablet 11  . NEOMYCIN-POLYMYXIN-HYDROCORTISONE (CORTISPORIN) 1 %  SOLN otic solution Apply 1-2 drops to toe BID after soaking 10 mL 1  . ondansetron (ZOFRAN) 4 MG tablet Take 1 tablet (4 mg total) by mouth every 8 (eight) hours as needed for nausea or vomiting. 20 tablet 1  . pantoprazole (PROTONIX) 40 MG tablet Take 1 tablet (40 mg total) by mouth daily. 180 tablet 1  . PREMPRO 0.3-1.5 MG tablet Take 1 tablet by mouth daily.  0  . Saxagliptin-Metformin (KOMBIGLYZE XR) 03-999 MG TB24 Take by mouth daily with lunch.    . valACYclovir (VALTREX) 500 MG tablet Take 500 mg by mouth daily.      No current facility-administered medications for this visit.    Allergies:   Actos; Codeine; Januvia; Lactose intolerance (gi); Morphine; and Sitagliptin phosphate    Social History:  The patient  reports that she has never smoked. She has never used smokeless tobacco. She reports that she does not drink  alcohol or use illicit drugs.   Family History:  The patient's family history is negative for Colon cancer, Esophageal cancer, Rectal cancer, and Stomach cancer. She was adopted.    ROS:  Please see the history of present illness.   Otherwise, review of systems are positive for feet itching.   All other systems are reviewed and negative.    PHYSICAL EXAM: VS:  BP 110/60 mmHg  Pulse 80  Ht 5' 2"  (1.575 m)  Wt 194 lb (87.998 kg)  BMI 35.47 kg/m2 , BMI Body mass index is 35.47 kg/(m^2). GEN: Well nourished, well developed, in no acute distress HEENT: normal Neck: no JVD, carotid bruits, or masses Cardiac: RRR; no murmurs, rubs, or gallops,no edema  Respiratory:  clear to auscultation bilaterally, normal work of breathing GI: soft, nontender, nondistended, + BS MS: no deformity or atrophy Skin: warm and dry, no rash Neuro:  Strength and sensation are intact Psych: euthymic mood, full affect   EKG:   The ekg ordered today demonstrates NSR, no ST segment changes   Recent Labs: 03/26/2016: Brain Natriuretic Peptide 8.0; BUN 13; Creat 0.70; Hemoglobin 12.8; Platelets 252; Potassium 4.1; Sodium 140   Lipid Panel    Component Value Date/Time   CHOL 181 01/27/2015 1100   TRIG 162.0* 01/27/2015 1100   HDL 44.40 01/27/2015 1100   CHOLHDL 4 01/27/2015 1100   VLDL 32.4 01/27/2015 1100   LDLCALC 104* 01/27/2015 1100   LDLDIRECT 91.6 08/14/2014 0822     Other studies Reviewed: Additional studies/ records that were reviewed today with results demonstrating: cath results reviewed.   ASSESSMENT AND PLAN:  1. CAD: branch vessel disease.  No angina. Continue aggressive secondary prevention. 2. Hyperlipidemia: Was in a study that was stopped.  Has not been on a cholesterol medicine for some time. Will recheck lipids. If they're elevated, we'll have to reconsider starting some medication. 3. DM: followed by primary care. Continue aggressive diabetes management. 4. Obesity: she has lost  20 pounds by cutting out sugary drinks. I encouraged her to continue with these lifestyle changes. 5. HTN: blood pressure well controlled. Continue current medicines. 6. Edema: elevate legs. Continue use compression stockings and diuretic.   Current medicines are reviewed at length with the patient today.  The patient concerns regarding her medicines were addressed.  The following changes have been made:  No change  Labs/ tests ordered today include: C minute and lipids No orders of the defined types were placed in this encounter.    Recommend 150 minutes/week of aerobic exercise Low fat, low carb,  high fiber diet recommended  Disposition:   FU in 6  months   Signed, Larae Grooms, MD  06/01/2016 12:22 PM    [facility] [place], [place], [place]  [address] Phone: [phone]; Fax: [phone]

## 2016-06-21 ENCOUNTER — Ambulatory Visit (INDEPENDENT_AMBULATORY_CARE_PROVIDER_SITE_OTHER): Payer: BC Managed Care – PPO | Admitting: Pharmacist

## 2016-06-21 DIAGNOSIS — E7800 Pure hypercholesterolemia, unspecified: Secondary | ICD-10-CM | POA: Diagnosis not present

## 2016-06-21 NOTE — Progress Notes (Addendum)
Patient ID: Jocelyn [name]                 DOB: Sep 26, 1956                    MRN: [id number]     HPI: Jocelyn [name] is a 60 y.o. female patient referred to lipid clinic by Dr. Irish Lack. PMH is significant for CAD, HLD, DM2, IBS, and HTN. Cardiac catheterization from 2010 showed one-vessel branch CAD with 90% occlusion, too small for PCI. Goal LDL < 94m/dL. Patient has history of statin intolerance including elevated CK > 10x ULN while on statin therapy and presents today for further management.  Patient was previously in AFreestonelipid lowering study in 2015 where she received either evavetrapib or placebo. She was also on Lipitor 853mdaily at that time. Her CK trended up to 754 and study drug was stopped. 4 weeks later CK was still 722 so Lipitor was also discontinued. Patient later rechallenged with pitavastatin in 2016 - her CK at that time rose to 1,584. Patient reported severe myalgias that resulted in falls during this time. Patient also tried Zetia but experienced myalgias.  Current Medications: none Intolerances: atorvastatin 208mnd 94m23mily - 2014 and 2015, pitavastatin 2mg 23mly - 2015 and 2016, Zetia 10mg 68my - 2014. Myalgias with all and CK elevations > 10x ULN on statins including rechallenges Risk Factors: CAD with 90% occlusion, DM, LDL > 200 LDL goal: < 70mg/d62mamily History: The patient's family history is negative for Colon cancer, Esophageal cancer, Rectal cancer, and Stomach cancer. She was adopted.   Social History: The patient  reports that she has never smoked. She has never used smokeless tobacco. She reports that she does not drink alcohol or use illicit drugs.   Labs: 06/01/16: TC 296, TG 205, HDL 53, LDL 202 (no lipid lowering therapy) CK elevations to 1,584 while on statins  Past Medical History:  Diagnosis Date  . Acute bronchitis   . Allergic rhinitis, cause unspecified   . Anemia   . Anxiety   . B12 deficiency   . BV (bacterial vaginosis)  06/22/1996  . Calculus of kidney   . Calculus of kidney   . Coronary atherosclerosis of unspecified type of vessel, native or graft   . Dizziness   . Essential hypertension, benign   . Fibroid 2003  . Fibromyalgia   . H/O dysmenorrhea 2008  . H/O varicella   . Headache(784.0)    frequently  . HSV-2 infection 2009  . Hyperplastic colon polyp 05/16/2014  . Irritable bowel syndrome   . Meniere's disease, unspecified   . Menses, irregular 2003  . Myalgia and myositis, unspecified   . Obstructive sleep apnea (adult) (pediatric)   . Perimenopausal symptoms 2003  . Pure hypercholesterolemia   . Sarcoidosis ([facility])   [place] or unspecified type diabetes mellitus without mention of complication, not stated as uncontrolled   . Unspecified venous (peripheral) insufficiency   . Vitamin D deficiency   . Vulvitis 2010  . Yeast infection     Current Outpatient Prescriptions on File Prior to Visit  Medication Sig Dispense Refill  . albuterol (PROVENTIL HFA;VENTOLIN HFA) 108 (90 BASE) MCG/ACT inhaler Inhale 1-2 puffs into the lungs every 6 (six) hours as needed for wheezing or shortness of breath. 8 g 2  . ALPRAZolam (XANAX) 0.5 MG tablet Take 0.5 mg by mouth at bedtime as needed for anxiety or sleep (take 1/2 tablet by mouth  IN THE MID-MORNING AND MID-AFTERNOON, and take 1 tablet once daily at bedtime if needed).    . Cyanocobalamin 1000 MCG/ML KIT Inject 1,000 mg as directed every 30 (thirty) days.    [name] diltiazem (CARDIZEM CD) 240 MG 24 hr capsule take 1 capsule by mouth once daily 90 capsule 3  . furosemide (LASIX) 20 MG tablet Take 1 tablet (20 mg total) by mouth daily. 90 tablet 3  . HP ACTHAR 80 UNIT/ML injectable gel Take 80 Units by mouth 3 (three) times a week.     . losartan (COZAAR) 50 MG tablet Take 2 tablets (100 mg total) by mouth daily. 60 tablet 11  . metoprolol tartrate (LOPRESSOR) 25 MG tablet take 1 tablet by mouth twice a day 60 tablet 11  . NEOMYCIN-POLYMYXIN-HYDROCORTISONE  (CORTISPORIN) 1 % SOLN otic solution Apply 1-2 drops to toe BID after soaking 10 mL 1  . ondansetron (ZOFRAN) 4 MG tablet Take 1 tablet (4 mg total) by mouth every 8 (eight) hours as needed for nausea or vomiting. 20 tablet 1  . pantoprazole (PROTONIX) 40 MG tablet Take 1 tablet (40 mg total) by mouth daily. 180 tablet 1  . PREMPRO 0.3-1.5 MG tablet Take 1 tablet by mouth daily.  0  . Saxagliptin-Metformin (KOMBIGLYZE XR) 03-999 MG TB24 Take by mouth daily with lunch.    . valACYclovir (VALTREX) 500 MG tablet Take 500 mg by mouth daily.      No current facility-administered medications on file prior to visit.     Allergies  Allergen Reactions  . Actos [Pioglitazone] Other (See Comments)    REACTION: pt states INTOL w/ edema  . Codeine Other (See Comments)    REACTION: vomiting  . Januvia [Sitagliptin] Nausea Only  . Lactose Intolerance (Gi) Diarrhea  . Morphine Nausea Only    REACTION: vomiting  . Sitagliptin Phosphate Other (See Comments)    REACTION: nausea    Assessment/Plan:  1. Hyperlipidemia - baseline LDL > 200 on no therapy and LDL goal < 47m/dL given CAD with 90% vessel occlusion. Patient intolerant to pitavastatin 237mand atorvastatin 2022mnd 26m22mily - CK elevated to 1,500 while on statin therapy. Also intolerant to Zetia with severe myalgias. Discussed PCSK9i including injection technique, adverse effects, and expected benefits. Will initiate paperwork for Repatha.   Ladeja Pelham E. Oscar Forman, PharmD, CPP [id number] [id number]: [phone]: [phone] 4:00 PM

## 2016-06-23 ENCOUNTER — Telehealth: Payer: Self-pay | Admitting: Pharmacist

## 2016-06-23 MED ORDER — EVOLOCUMAB 140 MG/ML ~~LOC~~ SOAJ: 1.0000 "pen " | SUBCUTANEOUS | 11 refills | Status: DC

## 2016-06-23 NOTE — Telephone Encounter (Signed)
Repatha approved for 1 year through 06/22/17. Pt is aware and will call once she receives shipment so we can set up lab work.

## 2016-07-02 ENCOUNTER — Telehealth: Payer: Self-pay | Admitting: Pharmacist

## 2016-07-02 DIAGNOSIS — E785 Hyperlipidemia, unspecified: Secondary | ICD-10-CM

## 2016-07-02 NOTE — Telephone Encounter (Signed)
Patient injected first Repatha injection into abdomen in clinic. Lab work scheduled for the beginning of October.

## 2016-07-22 ENCOUNTER — Emergency Department (HOSPITAL_COMMUNITY): Payer: No Typology Code available for payment source

## 2016-07-22 ENCOUNTER — Other Ambulatory Visit: Payer: Self-pay

## 2016-07-22 ENCOUNTER — Emergency Department (HOSPITAL_COMMUNITY)
Admission: EM | Admit: 2016-07-22 | Discharge: 2016-07-22 | Disposition: A | Discharge status: Home / Self Care | Payer: No Typology Code available for payment source | Attending: Emergency Medicine | Admitting: Emergency Medicine

## 2016-07-22 ENCOUNTER — Encounter (HOSPITAL_COMMUNITY): Payer: Self-pay | Admitting: *Deleted

## 2016-07-22 DIAGNOSIS — Y92410 Unspecified street and highway as the place of occurrence of the external cause: Secondary | ICD-10-CM | POA: Insufficient documentation

## 2016-07-22 DIAGNOSIS — J449 Chronic obstructive pulmonary disease, unspecified: Secondary | ICD-10-CM | POA: Diagnosis not present

## 2016-07-22 DIAGNOSIS — I2510 Atherosclerotic heart disease of native coronary artery without angina pectoris: Secondary | ICD-10-CM | POA: Insufficient documentation

## 2016-07-22 DIAGNOSIS — I10 Essential (primary) hypertension: Secondary | ICD-10-CM | POA: Diagnosis not present

## 2016-07-22 DIAGNOSIS — S8000XA Contusion of unspecified knee, initial encounter: Secondary | ICD-10-CM | POA: Diagnosis not present

## 2016-07-22 DIAGNOSIS — E119 Type 2 diabetes mellitus without complications: Secondary | ICD-10-CM | POA: Insufficient documentation

## 2016-07-22 DIAGNOSIS — S20219A Contusion of unspecified front wall of thorax, initial encounter: Secondary | ICD-10-CM

## 2016-07-22 DIAGNOSIS — S299XXA Unspecified injury of thorax, initial encounter: Secondary | ICD-10-CM | POA: Diagnosis present

## 2016-07-22 DIAGNOSIS — Y999 Unspecified external cause status: Secondary | ICD-10-CM | POA: Diagnosis not present

## 2016-07-22 DIAGNOSIS — Y939 Activity, unspecified: Secondary | ICD-10-CM | POA: Insufficient documentation

## 2016-07-22 MED ORDER — METHOCARBAMOL 500 MG PO TABS: 500.0000 mg | ORAL_TABLET | Freq: Two times a day (BID) | ORAL | 0 refills | Status: DC

## 2016-07-22 MED ORDER — NAPROXEN 500 MG PO TABS: 500.0000 mg | ORAL_TABLET | Freq: Two times a day (BID) | ORAL | 0 refills | Status: DC

## 2016-07-22 MED ORDER — METHOCARBAMOL 500 MG PO TABS
1000.0000 mg | ORAL_TABLET | Freq: Once | ORAL | Status: AC
  Administered 2016-07-22: 1000 mg via ORAL
  Filled 2016-07-22: qty 2

## 2016-07-22 MED ORDER — IBUPROFEN 800 MG PO TABS
800.0000 mg | ORAL_TABLET | Freq: Once | ORAL | Status: AC
  Administered 2016-07-22: 800 mg via ORAL
  Filled 2016-07-22: qty 1
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
## 2016-08-18 ENCOUNTER — Other Ambulatory Visit: Payer: BC Managed Care – PPO | Admitting: *Deleted

## 2016-08-18 DIAGNOSIS — E785 Hyperlipidemia, unspecified: Secondary | ICD-10-CM

## 2016-08-18 LAB — HEPATIC FUNCTION PANEL
ALBUMIN: 3.9 g/dL (ref 3.6–5.1)
ALK PHOS: 56 U/L (ref 33–130)
ALT: 17 U/L (ref 6–29)
AST: 21 U/L (ref 10–35)
Bilirubin, Direct: 0.1 mg/dL (ref <=0.2)
Indirect Bilirubin: 0.2 mg/dL (ref 0.2–1.2)
Total Bilirubin: 0.3 mg/dL (ref 0.2–1.2)
Total Protein: 6.1 g/dL (ref 6.1–8.1)

## 2016-08-18 LAB — LIPID PANEL
CHOL/HDL RATIO: 4.7 ratio (ref <=5.0)
CHOLESTEROL: 191 mg/dL (ref 125–200)
HDL: 41 mg/dL — AB (ref >=46)
LDL Cholesterol: 107 mg/dL (ref <130)
Triglycerides: 214 mg/dL — ABNORMAL HIGH (ref <150)
VLDL: 43 mg/dL — ABNORMAL HIGH (ref <30)

## 2016-09-13 ENCOUNTER — Telehealth: Payer: Self-pay | Admitting: Pulmonary Disease

## 2016-09-13 MED ORDER — AZITHROMYCIN 250 MG PO TABS: ORAL_TABLET | ORAL | 0 refills | Status: DC

## 2016-09-13 NOTE — Telephone Encounter (Signed)
Per MW- ok to call in zpak. OV if symptoms aren't improved.  Spoke with pt, aware of recs.  rx sent to preferred pharmacy.  Nothing further needed.

## 2016-09-13 NOTE — Telephone Encounter (Signed)
Pt c/o increased SOB, chest congestion, sinus congestion, PND, hoarseness, chills, sweats X3 days  Pt has been taking mucinex and drinking warm liquids. Requesting further recs.    Pt uses [facility] on [address]   Sending to MW as Bethann Goo is unavailable and RB has left.  MW please advise.  Thanks!   Patient Instructions   Today we updated your med list in our EPIC system...    Continue your current medications the same...   For your allergies>>    Use an OTC Antihistamine like Claritin-10/ Zyrtek-10/ ALLEGRA-180 once daily...    Use the Saline nasal spray as needed for nasal congestion & drainage, but use the OTC FLONASE (Fluticasone) 2 sprays in each nostril at bedtime...   Continue your ADVAIR but try one puff each AM regularly   For your venous insuffic & edema>    NO SALT, elevate your legs when able, wear compression hose whern up 7 about...    Try the 15-20 mmHg compression stockings on-line from [facility] [employer]...   Call for any questions...   Let's plan a follow up visit in 51mo, sooner if needed for problems.Marland KitchenMarland Kitchen

## 2016-10-06 ENCOUNTER — Other Ambulatory Visit: Payer: Self-pay | Admitting: Pulmonary Disease

## 2016-11-04 ENCOUNTER — Ambulatory Visit (INDEPENDENT_AMBULATORY_CARE_PROVIDER_SITE_OTHER): Payer: BC Managed Care – PPO | Admitting: Pulmonary Disease

## 2016-11-04 ENCOUNTER — Encounter: Payer: Self-pay | Admitting: Pulmonary Disease

## 2016-11-04 VITALS — BP 124/80 | HR 76 | Temp 97.4°F | Ht 62.0 in | Wt 193.0 lb

## 2016-11-04 DIAGNOSIS — E7800 Pure hypercholesterolemia, unspecified: Secondary | ICD-10-CM

## 2016-11-04 DIAGNOSIS — E1121 Type 2 diabetes mellitus with diabetic nephropathy: Secondary | ICD-10-CM

## 2016-11-04 DIAGNOSIS — J449 Chronic obstructive pulmonary disease, unspecified: Secondary | ICD-10-CM | POA: Diagnosis not present

## 2016-11-04 DIAGNOSIS — G4733 Obstructive sleep apnea (adult) (pediatric): Secondary | ICD-10-CM

## 2016-11-04 DIAGNOSIS — F411 Generalized anxiety disorder: Secondary | ICD-10-CM

## 2016-11-04 DIAGNOSIS — I872 Venous insufficiency (chronic) (peripheral): Secondary | ICD-10-CM

## 2016-11-04 DIAGNOSIS — Z862 Personal history of diseases of the blood and blood-forming organs and certain disorders involving the immune mechanism: Secondary | ICD-10-CM

## 2016-11-04 DIAGNOSIS — I10 Essential (primary) hypertension: Secondary | ICD-10-CM

## 2016-11-04 DIAGNOSIS — I2510 Atherosclerotic heart disease of native coronary artery without angina pectoris: Secondary | ICD-10-CM

## 2016-11-04 DIAGNOSIS — M3320 Polymyositis, organ involvement unspecified: Secondary | ICD-10-CM

## 2016-11-04 MED ORDER — AZITHROMYCIN 250 MG PO TABS: ORAL_TABLET | ORAL | 2 refills | Status: DC

## 2016-11-04 MED ORDER — ONDANSETRON HCL 4 MG PO TABS: 4.0000 mg | ORAL_TABLET | Freq: Three times a day (TID) | ORAL | 2 refills | Status: DC | PRN

## 2016-11-04 MED ORDER — PANTOPRAZOLE SODIUM 40 MG PO TBEC: 40.0000 mg | DELAYED_RELEASE_TABLET | Freq: Every day | ORAL | 11 refills | Status: DC

## 2016-11-04 NOTE — Patient Instructions (Signed)
Today we updated your med list in our EPIC system...    Continue your current medications the same...    We refilled the meds you requested...  We wrote for a ZPak to use for the upper resp infection...   Keep up the good work w/ diet, exercise, & your physical therapy...  Call for any questions...  Let's plan a follow up visit in 69mo, sooner if needed for problems.Marland KitchenMarland Kitchen

## 2016-11-05 ENCOUNTER — Telehealth: Payer: Self-pay | Admitting: Pulmonary Disease

## 2016-11-05 NOTE — Telephone Encounter (Signed)
Called and spoke with pharmacy and they will change the zofran over to the odt.  Med list has been updated and nothing further is needed.

## 2016-11-10 NOTE — Progress Notes (Signed)
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 yearly Flu shots at school;  Had PNEUMOVAX previously; TDAP given 4/12...   No past surgical history on file - other than Lithotripsy for renal stone...   Outpatient Encounter Prescriptions as of 11/04/2016  Medication Sig  . albuterol (PROVENTIL HFA;VENTOLIN HFA) 108 (90 BASE) MCG/ACT inhaler Inhale 1-2 puffs into the lungs every 6 (six) hours as needed for wheezing or shortness of breath.  . ALPRAZolam (XANAX) 0.5 MG tablet take 1/2 tablet by mouth IN THE MID-MORNING AND MID-AFTERNOON, and then take 1 tablet once daily at bedtime if needed  . Cyanocobalamin 1000 MCG/ML KIT Inject 1,000 mg as directed every 30 (thirty) days.  .Marland Kitchendiltiazem (CARDIZEM CD) 240 MG 24 hr capsule take 1 capsule by mouth once daily  . Evolocumab (REPATHA SURECLICK) 1509MG/ML SOAJ Inject 1 pen into the skin every 14 (fourteen) days.  . furosemide (LASIX) 20 MG tablet Take 1 tablet (20 mg total) by mouth daily.  .Marland KitchenHP ACTHAR 80 UNIT/ML injectable gel Inject 80 Units into the skin 3 (three) times a week.   . losartan (COZAAR) 50 MG tablet Take 2 tablets (100 mg total) by mouth daily.  . metoprolol tartrate (LOPRESSOR) 25 MG tablet take 1 tablet by mouth twice a day  . pantoprazole (PROTONIX) 40 MG tablet Take 1 tablet (40 mg total) by mouth daily.  .Marland KitchenPREMPRO 0.3-1.5 MG tablet Take 1 tablet by mouth daily.  .  Saxagliptin-Metformin (KOMBIGLYZE XR) 03-999 MG TB24 Take by mouth daily with lunch.  . valACYclovir (VALTREX) 500 MG tablet Take 500 mg by mouth daily.   . [DISCONTINUED] ondansetron (ZOFRAN) 4 MG tablet Take 1 tablet (4 mg total) by mouth every 8 (eight) hours as needed for nausea or vomiting.  . [DISCONTINUED] ondansetron (ZOFRAN) 4 MG tablet Take 1 tablet (4 mg total) by mouth every 8 (eight) hours as needed for nausea or vomiting. (Patient not taking: Reported on 11/05/2016)  . [DISCONTINUED] pantoprazole (PROTONIX) 40 MG tablet Take 1 tablet (40 mg total) by mouth daily.  .Marland Kitchenazithromycin (ZITHROMAX) 250 MG tablet Take 2 today, then 1 daily until gone. (Patient not taking: Reported on 11/04/2016)  . azithromycin (ZITHROMAX) 250 MG  tablet Take as directed  . [DISCONTINUED] methocarbamol (ROBAXIN) 500 MG tablet Take 1 tablet (500 mg total) by mouth 2 (two) times daily. (Patient not taking: Reported on 11/04/2016)  . [DISCONTINUED] naproxen (NAPROSYN) 500 MG tablet Take 1 tablet (500 mg total) by mouth 2 (two) times daily. (Patient not taking: Reported on 11/04/2016)   No facility-administered encounter medications on file as of 11/04/2016.     Allergies  Allergen Reactions  . Actos [Pioglitazone] Other (See Comments)    REACTION: pt states INTOL w/ edema  . Codeine Other (See Comments)    REACTION: vomiting  . Januvia [Sitagliptin] Nausea Only  . Lactose Intolerance (Gi) Diarrhea  . Morphine Nausea Only    REACTION: vomiting    Current Medications, Allergies, Past Medical History, Past Surgical History, Family History, and Social History were reviewed in [employer] record.   Review of Systems        See HPI - all other systems neg except as noted...      The patient complains of weight gain, dyspnea on exertion, and peripheral edema.  The patient denies anorexia, fever, weight loss, vision loss, decreased hearing, hoarseness, chest pain, syncope, prolonged  cough, headaches, hemoptysis, abdominal pain, melena, hematochezia, severe indigestion/heartburn, hematuria, incontinence, genital sores, suspicious skin lesions, transient blindness, difficulty walking, depression, unusual weight change, abnormal bleeding, enlarged lymph nodes, and angioedema.     Objective:   Physical Exam     WD, Overweight, 60 y/o BF in NAD... GENERAL:  Alert & oriented; pleasant & cooperative. HEENT:  [place]/AT, EOM-wnl, PERRLA, EACs-clear, TMs-wnl, NOSE-clear, THROAT-clear & wnl. NECK:  Supple w/ fairROM; no JVD; normal carotid impulses w/o bruits; no thyromegaly or nodules palpated; no lymphadenopathy. CHEST:  decrBS at bases, few scar rhonchi & end-exp wheezing, no rales, no consolidation... HEART:  Regular Rhythm; without murmurs/ rubs/ or gallops. ABDOMEN:  Soft & nontender; normal bowel sounds; no organomegaly or masses detected. EXT: without deformities, mild arthritic changes; no varicose veins but +venous insuffic & tr edema; +trigger points. NEURO:  CN's intact;  no focal neuro deficits... DERM:  No lesions noted; no rash etc...  RADIOLOGY DATA:  Reviewed in the EPIC EMR & discussed w/ the patient...  LABORATORY DATA:  Reviewed in the EPIC EMR & discussed w/ the patient...   Assessment & Plan:    RESP>  Hx AR, HxAsthma, old sarcoid, mild OSA>  All stable, breathing OK, notes some wheezing w/ activity; exam reveals some secretions in the airway & rec to take [facility] '1200mg'$  bid, Fluids, etc;. CXR remains stable/ NAD... 10/20/15>  Presented w/ refractory AB- already on Pred- given Depo80, adding Levaquin500 x7d, ADVAIR100Bid, Mucinex600-2Bid + fluids... 02/02/16>  Presents w/ another refractory AB- currently off Pred & MTX, on Imuran; treated w/ Depo, Medrol taper, Augmentin, Mucinex, Albut=HFA...  04/2016>  Back to baseline 11/04/16>  She has URI & we wrote for ZPak...   CARDS>  Followed by DrTTurner/ Rica Mote ?on meds listed? SHE DID NOT BRING MEDS TO THE  OV> Hx HBP, CAD, VI, etc;  Chest pain is non-cardiac & likely related to her FM; Doing satis & we reviewed exercise program etc...  CHOL>  Managed by [facility] for Cards- off Lip + off Zetia; ? Taking a study drug from [place]...  DM>  Followed by [name] on Metformin, Onglyzal; A1c improved to 6.7 in 2012, then 6.0 in 2013, & 6.5  In 2014;  rec continue meds & asked to have records sent to [name].Marland KitchenMarland Kitchen  GI>  Stable on Protonix as needed; see GI eval from [name] 7/15...  POLYMYOSITIS/ FM>  Diagnoses w/ Polymyositis recently (2016) & started on Pred, MTX, folate by DrBeekman=> switched to [employer] but insurance doesn't want to pay.  Anxiety>  She remains on Alprazolam as needed; & she stopped the Lexapro..  Anemia, Low Ferritin> on FeSO4 '325mg'$  & VitC 500...   B12 Defic> on B12 shots per [name]...   Patient's Medications  New Prescriptions   AZITHROMYCIN (ZITHROMAX) 250 MG TABLET    Take as directed  Previous Medications   ALBUTEROL (PROVENTIL HFA;VENTOLIN HFA) 108 (90 BASE) MCG/ACT INHALER    Inhale 1-2 puffs into the lungs every 6 (six) hours as needed for wheezing or shortness of breath.   ALPRAZOLAM (XANAX) 0.5 MG TABLET    take 1/2 tablet by mouth IN THE MID-MORNING AND MID-AFTERNOON, and then take 1 tablet once daily at bedtime if needed   AZITHROMYCIN (ZITHROMAX) 250 MG TABLET    Take 2 today, then 1 daily until gone.   CYANOCOBALAMIN 1000 MCG/ML KIT    Inject 1,000 mg as directed every 30 (thirty) days.   DILTIAZEM (CARDIZEM CD) 240 MG 24 HR CAPSULE    take 1 capsule by mouth once daily   EVOLOCUMAB (REPATHA SURECLICK) 505 MG/ML SOAJ    Inject 1 pen into the skin every 14 (fourteen) days.   FUROSEMIDE (LASIX) 20 MG TABLET    Take 1 tablet (20 mg total) by mouth daily.   HP ACTHAR 80 UNIT/ML INJECTABLE GEL    Inject 80 Units into the skin 3 (three) times a week.    LOSARTAN (COZAAR) 50 MG TABLET    Take 2 tablets (100 mg total) by mouth daily.   METOPROLOL TARTRATE (LOPRESSOR) 25 MG  TABLET    take 1 tablet by mouth twice a day   ONDANSETRON (ZOFRAN-ODT) 4 MG DISINTEGRATING TABLET    Take 4 mg by mouth every 8 (eight) hours as needed for nausea or vomiting.   PREMPRO 0.3-1.5 MG TABLET    Take 1 tablet by mouth daily.   SAXAGLIPTIN-METFORMIN (KOMBIGLYZE XR) 03-999 MG TB24    Take by mouth daily with lunch.   VALACYCLOVIR (VALTREX) 500 MG TABLET    Take 500 mg by mouth daily.   Modified Medications   Modified Medication Previous Medication   PANTOPRAZOLE (PROTONIX) 40 MG TABLET pantoprazole (PROTONIX) 40 MG tablet      Take 1 tablet (40 mg total) by mouth daily.    Take 1 tablet (40 mg total) by mouth daily.  Discontinued Medications   METHOCARBAMOL (ROBAXIN) 500 MG TABLET    Take 1 tablet (500 mg total) by mouth 2 (two) times daily.   NAPROXEN (NAPROSYN) 500 MG TABLET    Take 1 tablet (500 mg total) by mouth 2 (two) times daily.   ONDANSETRON (ZOFRAN) 4 MG TABLET    Take 1 tablet (4 mg total) by mouth every 8 (eight) hours as needed for nausea or vomiting.   ONDANSETRON (ZOFRAN) 4 MG TABLET    Take 1 tablet (4 mg total) by mouth every 8 (eight) hours as needed for nausea or vomiting.

## 2016-11-11 ENCOUNTER — Ambulatory Visit: Payer: BC Managed Care – PPO | Admitting: Pulmonary Disease

## 2016-11-11 ENCOUNTER — Other Ambulatory Visit: Payer: Self-pay | Admitting: Interventional Cardiology
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
## 2016-12-09 ENCOUNTER — Other Ambulatory Visit: Payer: Self-pay | Admitting: *Deleted

## 2016-12-09 ENCOUNTER — Encounter: Payer: Self-pay | Admitting: Interventional Cardiology

## 2016-12-09 ENCOUNTER — Ambulatory Visit (INDEPENDENT_AMBULATORY_CARE_PROVIDER_SITE_OTHER): Payer: BC Managed Care – PPO | Admitting: Interventional Cardiology

## 2016-12-09 VITALS — BP 142/84 | HR 99 | Ht 62.0 in | Wt 193.2 lb

## 2016-12-09 DIAGNOSIS — R609 Edema, unspecified: Secondary | ICD-10-CM | POA: Diagnosis not present

## 2016-12-09 DIAGNOSIS — I2510 Atherosclerotic heart disease of native coronary artery without angina pectoris: Secondary | ICD-10-CM | POA: Diagnosis not present

## 2016-12-09 DIAGNOSIS — G4733 Obstructive sleep apnea (adult) (pediatric): Secondary | ICD-10-CM

## 2016-12-09 DIAGNOSIS — R400 Somnolence: Secondary | ICD-10-CM

## 2016-12-09 DIAGNOSIS — E7800 Pure hypercholesterolemia, unspecified: Secondary | ICD-10-CM

## 2016-12-09 DIAGNOSIS — I10 Essential (primary) hypertension: Secondary | ICD-10-CM

## 2016-12-09 MED ORDER — NITROGLYCERIN 0.4 MG SL SUBL: 0.4000 mg | SUBLINGUAL_TABLET | SUBLINGUAL | 3 refills | Status: DC | PRN

## 2016-12-09 NOTE — Patient Instructions (Addendum)
Medication Instructions:  Start using Nitroglycerin as needed for chest discomfort. Please follow instructions on medication bottle and as directed at today's visit. All other medications remain the same.  Labwork: None  Testing/Procedures: None  Follow-Up: Your physician wants you to follow-up in: 6 months. You will receive a reminder letter in the mail two months in advance. If you don't receive a letter, please call our office to schedule the follow-up appointment.  Special instructions: We will be contacting you concerning your sleep study.     If you need a refill on your cardiac medications before your next appointment, please call your pharmacy.

## 2016-12-16 ENCOUNTER — Encounter: Payer: Self-pay | Admitting: Pulmonary Disease

## 2016-12-16 ENCOUNTER — Ambulatory Visit (INDEPENDENT_AMBULATORY_CARE_PROVIDER_SITE_OTHER): Payer: BC Managed Care – PPO | Admitting: Pulmonary Disease

## 2016-12-16 VITALS — BP 140/80 | HR 72 | Temp 97.9°F | Wt 192.1 lb

## 2016-12-16 DIAGNOSIS — Z862 Personal history of diseases of the blood and blood-forming organs and certain disorders involving the immune mechanism: Secondary | ICD-10-CM | POA: Diagnosis not present

## 2016-12-16 DIAGNOSIS — J449 Chronic obstructive pulmonary disease, unspecified: Secondary | ICD-10-CM

## 2016-12-16 DIAGNOSIS — E119 Type 2 diabetes mellitus without complications: Secondary | ICD-10-CM | POA: Insufficient documentation

## 2016-12-16 DIAGNOSIS — J3089 Other allergic rhinitis: Secondary | ICD-10-CM

## 2016-12-16 DIAGNOSIS — F411 Generalized anxiety disorder: Secondary | ICD-10-CM

## 2016-12-16 DIAGNOSIS — G4733 Obstructive sleep apnea (adult) (pediatric): Secondary | ICD-10-CM

## 2016-12-16 DIAGNOSIS — M3320 Polymyositis, organ involvement unspecified: Secondary | ICD-10-CM

## 2016-12-16 MED ORDER — FLUTICASONE PROPIONATE 50 MCG/ACT NA SUSP: 2.0000 | Freq: Every day | NASAL | 2 refills | Status: DC

## 2016-12-16 MED ORDER — AMOXICILLIN-POT CLAVULANATE 875-125 MG PO TABS: 1.0000 | ORAL_TABLET | Freq: Two times a day (BID) | ORAL | 0 refills | Status: DC

## 2016-12-16 NOTE — Progress Notes (Signed)
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 (esp in chest in AM) & we discussed trial Lunesta '2mg'$ Qhs, & Tramadol '50mg'$ Tid; plus rest, heat, etc... States she's INTOL Ambien w/ weird dreams & Benedryl/ Melatonin w/o help... ~  11/14: she went to the ER w/ LBP> felt to be a lumbar strain... ~  She was seen by [name] for Rheum [id number]...  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 supplemented w/ OTC Vit D 1000 u daily. ~  Vit D level was lower from [name] & he treated w/ Vit D 50K weekly for awhile, then switched to OTC supplement...  ANXIETY - on ALPRAZOLAM 0.'5mg'$  Prn... DEPRESSION - prev on Lexapro '20mg'$ /d but she stopped on her own ~10/13 ?cost issues? ~  4/12: c/o feeling sad & anxious all the time- weak, not resting well, under a lot of stress; rec to seek counselling thru her local church or let us refer to  [facility], & start RX w/ Zoloft 50==> but she was INTOL... ~  2013: switched to Lexapro & seems to be doing better but didn't stick w/ it due to cost issues...  ANEMIA & LOW FERRITIN LEVEL >>  B12 DEFICIENCY >> treated by [name] w/ B12 shots ~  9/14: she tells me that Ferritin is low & [name] tried her on Oral Fe prep w/o improvement & wants me to f/u and treat this problem; we discussed need to recheck labs here- LABS 9/14 showed Hg=12.5, MCV=88, Fe=47 (12%sat), Ferritin=10.8; she is due for f/u colon w/ [name] & we will refer, rec to start FeSO4 '325mg'$  Bid w/ VitC500 w/ plans for f/u CBC, Fe studies in 2-16mo ~  4/15:  Labs showed Hg= 12.5 but Fe=31 (8%); she was only taking the Fe once daily 7 advised to incr to Bid w/ vitC500; needs f/u  colon & we will refer to [name] again...  HEALTH MAINTENANCE: ~  GI:  Followed by [name] & colon due 7/13... ~  GYN:  Followed by DrDillard & she gets yearly PAP, Mammogram, hasn't had baseline BMD yet, started on [facility] 5/13... ~  Immunizations:  She gets the yearly Flu shots at school;  Had PNEUMOVAX previously; TDAP given 4/12...   Past Surgical History:  Procedure Laterality Date  . heart catherization    . TONSILLECTOMY    No other past surgical history on file - other than Lithotripsy for renal stone...   Outpatient Encounter Prescriptions as of 12/16/2016  Medication Sig  . albuterol (PROVENTIL HFA;VENTOLIN HFA) 108 (90 BASE) MCG/ACT inhaler Inhale 1-2 puffs into the lungs every 6 (six) hours as needed for wheezing or shortness of breath.  . ALPRAZolam (XANAX) 0.5 MG tablet take 1/2 tablet by mouth IN THE MID-MORNING AND MID-AFTERNOON, and then take 1 tablet once daily at bedtime if needed  . Cyanocobalamin 1000 MCG/ML KIT Inject 1,000 mg as directed every 30 (thirty) days.  [name] diltiazem (CARDIZEM CD) 240 MG 24 hr capsule take 1 capsule by mouth once daily  . Evolocumab (REPATHA SURECLICK) 244 MG/ML SOAJ Inject 1 pen into the skin every  14 (fourteen) days.  . furosemide (LASIX) 20 MG tablet Take 1 tablet (20 mg total) by mouth daily.  [name] HP ACTHAR 80 UNIT/ML injectable gel Inject 80 Units into the skin 3 (three) times a week.   . losartan (COZAAR) 50 MG tablet take 2 tablets by mouth daily  . metoprolol tartrate (LOPRESSOR) 25 MG tablet take 1 tablet by mouth twice a day  . nitroGLYCERIN (NITROSTAT) 0.4 MG SL tablet Place 1 tablet (0.4 mg total) under the tongue every 5 (five) minutes as needed for chest pain.  [name] ondansetron (ZOFRAN-ODT) 4 MG disintegrating tablet Take 4 mg by mouth every 8 (eight) hours as needed for nausea or vomiting.  . pantoprazole (PROTONIX) 40 MG tablet Take 1 tablet (40 mg total) by mouth daily.  [name] PREMPRO 0.3-1.5 MG tablet Take 1 tablet by mouth daily.  . Saxagliptin-Metformin (KOMBIGLYZE XR) 03-999 MG TB24 Take by mouth daily with lunch.  . valACYclovir (VALTREX) 500 MG tablet Take 500 mg by mouth daily.   [name] amoxicillin-clavulanate (AUGMENTIN) 875-125 MG tablet Take 1 tablet by mouth 2 (two) times daily.  . fluticasone (FLONASE) 50 MCG/ACT nasal spray Place 2 sprays into both nostrils at bedtime.   No facility-administered encounter medications on file as of 12/16/2016.     Allergies  Allergen Reactions  . Actos [Pioglitazone] Other (See Comments)    REACTION: pt states INTOL w/ edema  . Codeine Other (See Comments)    REACTION: vomiting  . Januvia [Sitagliptin] Nausea Only  . Lactose Intolerance (Gi) Diarrhea  . Morphine Nausea Only    REACTION: vomiting    Immunization History  Administered Date(s) Administered  . Influenza Split 08/16/2011, 08/16/2012, 08/16/2013, 09/04/2014  . Influenza Whole 09/16/2009, 08/15/2010  . Influenza,inj,Quad PF,36+ Mos 08/30/2016  . Influenza-Unspecified 08/19/2015  . Pneumococcal Polysaccharide-23 11/15/2004  . Tdap 02/22/2011    Current Medications, Allergies, Past Medical History, Past Surgical History, Family History, and Social History were reviewed  in [employer] record.   Review of Systems        See HPI - all other systems neg except as noted...      The patient complains of weight gain, dyspnea on exertion, and peripheral edema.  The patient denies anorexia, fever,  weight loss, vision loss, decreased hearing, hoarseness, chest pain, syncope, prolonged cough, headaches, hemoptysis, abdominal pain, melena, hematochezia, severe indigestion/heartburn, hematuria, incontinence, genital sores, suspicious skin lesions, transient blindness, difficulty walking, depression, unusual weight change, abnormal bleeding, enlarged lymph nodes, and angioedema.     Objective:   Physical Exam     WD, Overweight, 61 y/o BF in NAD... GENERAL:  Alert & oriented; pleasant & cooperative. HEENT:  [place]/AT, EOM-wnl, PERRLA, EACs-clear, TMs-wnl, NOSE-clear, THROAT-clear & wnl. NECK:  Supple w/ fairROM; no JVD; normal carotid impulses w/o bruits; no thyromegaly or nodules palpated; no lymphadenopathy. CHEST:  decrBS at bases, few scar rhonchi & end-exp wheezing, no rales, no consolidation... HEART:  Regular Rhythm; without murmurs/ rubs/ or gallops. ABDOMEN:  Soft & nontender; normal bowel sounds; no organomegaly or masses detected. EXT: without deformities, mild arthritic changes; no varicose veins but +venous insuffic & tr edema; +trigger points. NEURO:  CN's intact;  no focal neuro deficits... DERM:  No lesions noted; no rash etc...  RADIOLOGY DATA:  Reviewed in the EPIC EMR & discussed w/ the patient...  LABORATORY DATA:  Reviewed in the EPIC EMR & discussed w/ the patient...   Assessment & Plan:    RESP>  Hx AR, HxAsthma, old sarcoid, mild OSA>  All stable, breathing OK, notes some wheezing w/ activity; exam reveals some secretions in the airway & rec to take [facility] '1200mg'$  bid, Fluids, etc;. CXR remains stable/ NAD... 10/20/15>  Presented w/ refractory AB- already on Pred- given Depo80, adding Levaquin500 x7d, ADVAIR100Bid,  Mucinex600-2Bid + fluids... 02/02/16>  Presents w/ another refractory AB- currently off Pred & MTX, on Imuran; treated w/ Depo, Medrol taper, Augmentin, Mucinex, Albut=HFA...  04/2016>  Back to baseline 11/04/16>  She has URI & we wrote for ZPak => subseq Augmentin, Saline, flonase, Mucinex...   CARDS>  Followed by DrTTurner/ [place] Mote ?on meds listed? SHE DID NOT BRING MEDS TO THE OV> Hx HBP, CAD, VI, etc;  Chest pain is non-cardiac & likely related to her FM; Doing satis & we reviewed exercise program etc...  CHOL>  Managed by [facility] for Cards- off Lip + off Zetia; ? Taking a study drug from [place]...  DM>  Followed by [name] on Metformin, Onglyzal; A1c improved to 6.7 in 2012, then 6.0 in 2013, & 6.5  In 2014;  rec continue meds & asked to have records sent to [name]...  GI>  Stable on Protonix as needed; see GI eval from [name] 7/15...  POLYMYOSITIS/ FM>  Diagnoses w/ Polymyositis recently (2016) & started on Pred, MTX, folate by DrBeekman=> switched to [employer] but insurance doesn't want to pay.  Anxiety>  She remains on Alprazolam as needed; & she stopped the Lexapro..  Anemia, Low Ferritin> on FeSO4 '325mg'$  & VitC 500...   B12 Defic> on B12 shots per [name]...  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); due to this stress and her mult medical issues including polymyositis (DrBeekman), DM2 ([name]), heart problems ([name])- she tells me she is taking a LOA for the rest of this school yr & planning on retirement...   Patient's Medications  New Prescriptions   AMOXICILLIN-CLAVULANATE (AUGMENTIN) 875-125 MG TABLET    Take 1 tablet by mouth 2 (two) times daily.   FLUTICASONE (FLONASE) 50 MCG/ACT NASAL SPRAY    Place 2 sprays into both nostrils at bedtime.  Previous Medications   ALBUTEROL (PROVENTIL HFA;VENTOLIN HFA) 108 (90 BASE) MCG/ACT INHALER    Inhale 1-2 puffs  into the lungs every 6 (six) hours as  needed for wheezing or shortness of breath.   ALPRAZOLAM (XANAX) 0.5 MG TABLET    take 1/2 tablet by mouth IN THE MID-MORNING AND MID-AFTERNOON, and then take 1 tablet once daily at bedtime if needed   CYANOCOBALAMIN 1000 MCG/ML KIT    Inject 1,000 mg as directed every 30 (thirty) days.   DILTIAZEM (CARDIZEM CD) 240 MG 24 HR CAPSULE    take 1 capsule by mouth once daily   EVOLOCUMAB (REPATHA SURECLICK) 403 MG/ML SOAJ    Inject 1 pen into the skin every 14 (fourteen) days.   FUROSEMIDE (LASIX) 20 MG TABLET    Take 1 tablet (20 mg total) by mouth daily.   HP ACTHAR 80 UNIT/ML INJECTABLE GEL    Inject 80 Units into the skin 3 (three) times a week.    LOSARTAN (COZAAR) 50 MG TABLET    take 2 tablets by mouth daily   METOPROLOL TARTRATE (LOPRESSOR) 25 MG TABLET    take 1 tablet by mouth twice a day   NITROGLYCERIN (NITROSTAT) 0.4 MG SL TABLET    Place 1 tablet (0.4 mg total) under the tongue every 5 (five) minutes as needed for chest pain.   ONDANSETRON (ZOFRAN-ODT) 4 MG DISINTEGRATING TABLET    Take 4 mg by mouth every 8 (eight) hours as needed for nausea or vomiting.   PANTOPRAZOLE (PROTONIX) 40 MG TABLET    Take 1 tablet (40 mg total) by mouth daily.   PREMPRO 0.3-1.5 MG TABLET    Take 1 tablet by mouth daily.   SAXAGLIPTIN-METFORMIN (KOMBIGLYZE XR) 03-999 MG TB24    Take by mouth daily with lunch.   VALACYCLOVIR (VALTREX) 500 MG TABLET    Take 500 mg by mouth daily.   Modified Medications   No medications on file  Discontinued Medications   No medications on file

## 2016-12-16 NOTE — Patient Instructions (Signed)
Today we updated your med list in our EPIC system...    Continue your current medications the same...  We wrote a new prescription for AUGMENTIN a stronger antibiotic for your upper resp infection...    Take one tab twice daily til gone...    Be sure to use on OTC Probiotic like align one cap daily while you are taking the antibiotic...  For your nasal passages>    Use an OTC SALINE MIST like Ocean nasal spray & spray 1-2 sprays in each nostril every 1-2H    Use the FLONASE nasal cortisone spray by spraying 2 puffs in each nostril at bedtime...  I agree that your myositis, DM, & other medical issues makes it very difficult for you to function in your job...    I will write a letter recommending that you take a leave of absence, while you consider your options such as retirement.   Start working on DIET, EXERCISE, WEIGHT REDUCTION as we discussed...  Call for any questions or if we can be of service in any way.Marland KitchenMarland Kitchen

## 2016-12-22 ENCOUNTER — Telehealth: Payer: Self-pay | Admitting: Pulmonary Disease

## 2016-12-22 NOTE — Telephone Encounter (Signed)
Spoke with pt. She states that she dropped forms off with Leigh on Friday. Pt is wanting an update on these forms.  Leigh - please advise. Thanks.

## 2016-12-24 NOTE — Telephone Encounter (Signed)
Patient waiting in the clinic and left dates pertaining to the forms that were left last Friday...  Leave begins- December 27 2016 Leave end- April 29 2017 or end of school year.  Patient states employer is requesting the form as soon as possible...  Contact # [phone].Jocelyn [name]

## 2016-12-24 NOTE — Telephone Encounter (Signed)
Martin Majestic out and spoke with pt and she is aware that she will need to come back on Monday to pick up these forms.  SN will complete them this weekend. Pt voiced her understanding.

## 2016-12-27 ENCOUNTER — Telehealth: Payer: Self-pay | Admitting: Pulmonary Disease

## 2016-12-27 NOTE — Telephone Encounter (Signed)
Leigh do you have papers?

## 2016-12-28 NOTE — Telephone Encounter (Signed)
Spoke with Leigh. She states that SN has completed these froms. Marliss Czar will take these forms to the pt in the lobby.

## 2016-12-28 NOTE — Telephone Encounter (Signed)
Patient is waiting in the clinic to pick up forms.Jocelyn [name]

## 2017-01-02 ENCOUNTER — Encounter (HOSPITAL_BASED_OUTPATIENT_CLINIC_OR_DEPARTMENT_OTHER): Payer: BC Managed Care – PPO

## 2017-01-05 ENCOUNTER — Other Ambulatory Visit: Payer: Self-pay | Admitting: Interventional Cardiology

## 2017-02-16 ENCOUNTER — Encounter (HOSPITAL_BASED_OUTPATIENT_CLINIC_OR_DEPARTMENT_OTHER): Payer: BC Managed Care – PPO

## 2017-03-23 ENCOUNTER — Other Ambulatory Visit: Payer: Self-pay | Admitting: Pulmonary Disease

## 2017-03-23 NOTE — Telephone Encounter (Signed)
Patient is asking for a refill on her alprazolam 0.5mg . Her last refill was on 10/06/16 for 60 tabs with 4 refills.   Her last OV was on 12/16/16.   Is it ok for her to receive a refill? If so, patient wishes to use [facility] on [address]. Thanks!

## 2017-04-07 ENCOUNTER — Other Ambulatory Visit: Payer: Self-pay | Admitting: Interventional Cardiology

## 2017-04-12 ENCOUNTER — Ambulatory Visit (INDEPENDENT_AMBULATORY_CARE_PROVIDER_SITE_OTHER): Payer: BC Managed Care – PPO | Admitting: Adult Health

## 2017-04-12 ENCOUNTER — Ambulatory Visit (INDEPENDENT_AMBULATORY_CARE_PROVIDER_SITE_OTHER)
Admission: RE | Admit: 2017-04-12 | Discharge: 2017-04-12 | Disposition: A | Discharge status: Home / Self Care | Payer: BC Managed Care – PPO | Source: Ambulatory Visit | Attending: Adult Health | Admitting: Adult Health

## 2017-04-12 ENCOUNTER — Encounter: Payer: Self-pay | Admitting: Adult Health

## 2017-04-12 DIAGNOSIS — J449 Chronic obstructive pulmonary disease, unspecified: Secondary | ICD-10-CM

## 2017-04-12 MED ORDER — PREDNISONE 10 MG PO TABS: ORAL_TABLET | ORAL | 0 refills | Status: DC

## 2017-04-12 MED ORDER — BUDESONIDE-FORMOTEROL FUMARATE 80-4.5 MCG/ACT IN AERO: 2.0000 | INHALATION_SPRAY | Freq: Two times a day (BID) | RESPIRATORY_TRACT | 12 refills | Status: DC

## 2017-04-12 MED ORDER — LEVALBUTEROL HCL 0.63 MG/3ML IN NEBU
0.6300 mg | INHALATION_SOLUTION | Freq: Once | RESPIRATORY_TRACT | Status: AC
  Administered 2017-04-12: 0.63 mg via RESPIRATORY_TRACT

## 2017-04-12 MED ORDER — AMOXICILLIN-POT CLAVULANATE 875-125 MG PO TABS: 1.0000 | ORAL_TABLET | Freq: Two times a day (BID) | ORAL | 0 refills | Status: AC

## 2017-04-12 NOTE — Addendum Note (Signed)
Addended by: Chase Picket A on: 04/12/2017 05:47 PM   Modules accepted: Orders

## 2017-04-12 NOTE — Addendum Note (Signed)
Addended by: Chase Picket A on: 04/12/2017 05:29 PM   Modules accepted: Orders

## 2017-04-12 NOTE — Assessment & Plan Note (Signed)
Flare  Check cxr  xopenex neb x 1  Trial of symbicort   Plan  Patient Instructions  Chest xray today .  Augmentin 875mg  Twice daily  For 1 week . Take with food .  Eat yogurt .  Mucinex DM Twice daily  As needed  Cough /congestion  Prednisone taper over next week.  Begin Symbicort 2 puffs Twice daily  Until sample is gone.  Please contact office for sooner follow up if symptoms do not improve or worsen or seek emergency care  follow up Dr. [name]  In 1 month and As needed   Call if Blood sugar is >250 .

## 2017-04-12 NOTE — Patient Instructions (Addendum)
Chest xray today .  Augmentin 875mg  Twice daily  For 1 week . Take with food .  Eat yogurt .  Mucinex DM Twice daily  As needed  Cough /congestion  Prednisone taper over next week.  Begin Symbicort 2 puffs Twice daily  Until sample is gone.  Please contact office for sooner follow up if symptoms do not improve or worsen or seek emergency care  follow up Dr. [name]  In 1 month and As needed   Call if Blood sugar is >250 .

## 2017-04-12 NOTE — Progress Notes (Signed)
_0  ID: Jocelyn [name], female    DOB: June 18, 1956, 61 y.o.   MRN: [id number]  Chief Complaint  Patient presents with  . Acute Visit    Cough     Referring provider: Noralee Space, MD  HPI: 61 year old female with known asthma, allergic rhinitis sarcoid She has polymyositis and scleroderma overlap syndrome  04/12/2017 Acute OV  Pt presents for an acute office visit. Complains of 1 week of cough , congestion , wheezing , rattling cough . Feels achy . Ear pressure. Pain with coughing . Coughing up thick white/yellow mucus .  Took left over zpack , last dose today . Did not help much .  Taking mucinex and claritin D . Advised to avoid sudafed as b/p is elevated today .  Good appetite w/ no n/vd..  No chest pain, orthopnea, edema or fever or hemoptysis.      Allergies  Allergen Reactions  . Actos [Pioglitazone] Other (See Comments)    REACTION: pt states INTOL w/ edema  . Codeine Other (See Comments)    REACTION: vomiting  . Januvia [Sitagliptin] Nausea Only  . Lactose Intolerance (Gi) Diarrhea  . Morphine Nausea Only    REACTION: vomiting    Immunization History  Administered Date(s) Administered  . Influenza Split 08/16/2011, 08/16/2012, 08/16/2013, 09/04/2014  . Influenza Whole 09/16/2009, 08/15/2010  . Influenza,inj,Quad PF,36+ Mos 08/30/2016  . Influenza-Unspecified 08/19/2015  . Pneumococcal Polysaccharide-23 11/15/2004  . Tdap 02/22/2011    Past Medical History:  Diagnosis Date  . Acute bronchitis   . Allergic rhinitis, cause unspecified   . Anemia   . Anxiety   . B12 deficiency   . BV (bacterial vaginosis) 06/22/1996  . Calculus of kidney   . Calculus of kidney   . Coronary atherosclerosis of unspecified type of vessel, native or graft   . Dizziness   . Essential hypertension, benign   . Fibroid 2003  . Fibromyalgia   . H/O dysmenorrhea 2008  . H/O varicella   . Headache(784.0)    frequently  . HSV-2 infection 2009  . Hyperplastic colon  polyp 05/16/2014  . Irritable bowel syndrome   . Meniere's disease, unspecified   . Menses, irregular 2003  . Myalgia and myositis, unspecified   . Obstructive sleep apnea (adult) (pediatric)   . Perimenopausal symptoms 2003  . Pure hypercholesterolemia   . Sarcoidosis   . Type II or unspecified type diabetes mellitus without mention of complication, not stated as uncontrolled   . Unspecified venous (peripheral) insufficiency   . Vitamin D deficiency   . Vulvitis 2010  . Yeast infection     Tobacco History: History  Smoking Status  . Never Smoker  Smokeless Tobacco  . Never Used    Comment: Daily Caffeine - 1  Exercise 2-3 times/weekly   Counseling given: Not Answered   Outpatient Encounter Prescriptions as of 04/12/2017  Medication Sig  . albuterol (PROVENTIL HFA;VENTOLIN HFA) 108 (90 BASE) MCG/ACT inhaler Inhale 1-2 puffs into the lungs every 6 (six) hours as needed for wheezing or shortness of breath.  . ALPRAZolam (XANAX) 0.5 MG tablet take 1/2 tablet by mouth IN THE MID-MORNING AND MID-AFTERNOON, and then take 1 tablet once daily at bedtime if needed  . CARTIA XT 240 MG 24 hr capsule take 1 capsule by mouth once daily  . Cyanocobalamin 1000 MCG/ML KIT Inject 1,000 mg as directed every 30 (thirty) days.  . Evolocumab (REPATHA SURECLICK) 893 MG/ML SOAJ Inject 1 pen into the skin every 14 (  fourteen) days.  . fluticasone (FLONASE) 50 MCG/ACT nasal spray Place 2 sprays into both nostrils at bedtime.  . furosemide (LASIX) 20 MG tablet Take 1 tablet (20 mg total) by mouth daily.  [name] HP ACTHAR 80 UNIT/ML injectable gel Inject 80 Units into the skin 3 (three) times a week.   . losartan (COZAAR) 50 MG tablet take 2 tablets by mouth daily  . metoprolol tartrate (LOPRESSOR) 25 MG tablet take 1 tablet by mouth twice a day  . nitroGLYCERIN (NITROSTAT) 0.4 MG SL tablet Place 1 tablet (0.4 mg total) under the tongue every 5 (five) minutes as needed for chest pain.  [name] ondansetron  (ZOFRAN-ODT) 4 MG disintegrating tablet Take 4 mg by mouth every 8 (eight) hours as needed for nausea or vomiting.  . pantoprazole (PROTONIX) 40 MG tablet Take 1 tablet (40 mg total) by mouth daily.  [name] PREMPRO 0.3-1.5 MG tablet Take 1 tablet by mouth daily.  . Saxagliptin-Metformin (KOMBIGLYZE XR) 03-999 MG TB24 Take by mouth daily with lunch.  . valACYclovir (VALTREX) 500 MG tablet Take 500 mg by mouth daily.   [name] amoxicillin-clavulanate (AUGMENTIN) 875-125 MG tablet Take 1 tablet by mouth 2 (two) times daily.  . predniSONE (DELTASONE) 10 MG tablet 4 tabs for 2 days, then 3 tabs for 2 days, 2 tabs for 2 days, then 1 tab for 2 days, then stop  . [DISCONTINUED] amoxicillin-clavulanate (AUGMENTIN) 875-125 MG tablet Take 1 tablet by mouth 2 (two) times daily.   No facility-administered encounter medications on file as of 04/12/2017.      Review of Systems  Constitutional:   No  weight loss, night sweats,  Fevers, chills,  +fatigue, or  lassitude.  HEENT:   No headaches,  Difficulty swallowing,  Tooth/dental problems, or  Sore throat,                No sneezing, itching, ear ache +, nasal congestion, post nasal drip,   CV:  No chest pain,  Orthopnea, PND, swelling in lower extremities, anasarca, dizziness, palpitations, syncope.   GI  No heartburn, indigestion, abdominal pain, nausea, vomiting, diarrhea, change in bowel habits, loss of appetite, bloody stools.   Resp:    No chest wall deformity  Skin: no rash or lesions.  GU: no dysuria, change in color of urine, no urgency or frequency.  No flank pain, no hematuria   MS:  No joint pain or swelling.  No decreased range of motion.  No back pain.    Physical Exam  BP (!) 150/88 (BP Location: Right Arm, Cuff Size: Normal)   Pulse 78   Ht _0  (1.575 m)   Wt 184 lb 12.8 oz (83.8 kg)   SpO2 97%   BMI 33.80 kg/m   GEN: A/Ox3; pleasant , NAD    HEENT:  [place]/AT,  EACs-clear, TMs-wnl, NOSE-clear, THROAT-clear, no lesions, no postnasal  drip or exudate noted.   NECK:  Supple w/ fair ROM; no JVD; normal carotid impulses w/o bruits; no thyromegaly or nodules palpated; no lymphadenopathy.    RESP  Few rhonchi and exp wheezing  no accessory muscle use, no dullness to percussion Speaks in full sentences   CARD:  RRR, no m/r/g, no peripheral edema, pulses intact, no cyanosis or clubbing.  GI:   Soft & nt; nml bowel sounds; no organomegaly or masses detected.   Musco: Warm bil, no deformities or joint swelling noted.   Neuro: alert, no focal deficits noted.    Skin: Warm, no lesions or  rashes    Lab Results:  CBC    Component Value Date/Time   WBC 6.2 03/26/2016 1609   RBC 4.19 03/26/2016 1609   HGB 12.8 03/26/2016 1609   HCT 37.5 03/26/2016 1609   PLT 252 03/26/2016 1609   MCV 89.5 03/26/2016 1609   MCH 30.5 03/26/2016 1609   MCHC 34.1 03/26/2016 1609   RDW 13.6 03/26/2016 1609   LYMPHSABS 1.6 02/02/2016 1229   MONOABS 0.7 02/02/2016 1229   EOSABS 0.1 02/02/2016 1229   BASOSABS 0.0 02/02/2016 1229    BMET    Component Value Date/Time   NA 143 06/01/2016 1254   K 4.4 06/01/2016 1254   CL 103 06/01/2016 1254   CO2 26 06/01/2016 1254   GLUCOSE 152 (H) 06/01/2016 1254   BUN 18 06/01/2016 1254   CREATININE 0.83 06/01/2016 1254   CALCIUM 9.6 06/01/2016 1254   GFRNONAA 113.27 08/14/2010 1013   GFRAA 96 12/13/2007 0820    BNP    Component Value Date/Time   BNP 8.0 03/26/2016 1609    ProBNP    Component Value Date/Time   PROBNP 26.0 02/13/2014 1726    Imaging: No results found.   Assessment & Plan:   Asthmatic bronchitis , chronic (HCC) Flare  Check cxr  xopenex neb x 1  Trial of symbicort   Plan  Patient Instructions  Chest xray today .  Augmentin 826m Twice daily  For 1 week . Take with food .  Eat yogurt .  Mucinex DM Twice daily  As needed  Cough /congestion  Prednisone taper over next week.  Begin Symbicort 2 puffs Twice daily  Until sample is gone.  Please contact office  for sooner follow up if symptoms do not improve or worsen or seek emergency care  follow up Dr. NLenna Gilford In 1 month and As needed   Call if Blood sugar is >250 .          TRexene Edison NP 04/12/2017

## 2017-04-12 NOTE — Addendum Note (Signed)
Addended by: Chase Picket A on: 04/12/2017 05:44 PM   Modules accepted: Orders

## 2017-04-19 ENCOUNTER — Other Ambulatory Visit: Payer: Self-pay | Admitting: Interventional Cardiology

## 2017-04-27 ENCOUNTER — Telehealth: Payer: Self-pay | Admitting: Pulmonary Disease

## 2017-04-27 MED ORDER — ALPRAZOLAM 0.5 MG PO TABS: ORAL_TABLET | ORAL | 5 refills | Status: DC

## 2017-04-27 NOTE — Telephone Encounter (Signed)
Called and spoke with Cassy and she stated that she received the fax this morning and cannot read what SN wrote on the rx that was sent back.  She did say that the pt has not filled this rx for the xanax as of yet.  Advised cassy I will try and find the rx that was sent in and figure out what was written on it and call her back.

## 2017-04-27 NOTE — Telephone Encounter (Signed)
Called and spoke with Jocelyn [name] and she is aware of ok to refill the alprazolam.  I have called this in for the pt.  Nothing further is needed.

## 2017-05-05 ENCOUNTER — Ambulatory Visit (INDEPENDENT_AMBULATORY_CARE_PROVIDER_SITE_OTHER): Payer: BC Managed Care – PPO | Admitting: Pulmonary Disease

## 2017-05-05 ENCOUNTER — Encounter: Payer: Self-pay | Admitting: Pulmonary Disease

## 2017-05-05 VITALS — BP 118/60 | HR 66 | Temp 97.1°F | Ht 62.0 in | Wt 181.5 lb

## 2017-05-05 DIAGNOSIS — M3320 Polymyositis, organ involvement unspecified: Secondary | ICD-10-CM

## 2017-05-05 DIAGNOSIS — F411 Generalized anxiety disorder: Secondary | ICD-10-CM | POA: Diagnosis not present

## 2017-05-05 DIAGNOSIS — I10 Essential (primary) hypertension: Secondary | ICD-10-CM | POA: Diagnosis not present

## 2017-05-05 DIAGNOSIS — J449 Chronic obstructive pulmonary disease, unspecified: Secondary | ICD-10-CM | POA: Diagnosis not present

## 2017-05-05 DIAGNOSIS — E663 Overweight: Secondary | ICD-10-CM

## 2017-05-05 DIAGNOSIS — I2510 Atherosclerotic heart disease of native coronary artery without angina pectoris: Secondary | ICD-10-CM

## 2017-05-05 DIAGNOSIS — E119 Type 2 diabetes mellitus without complications: Secondary | ICD-10-CM

## 2017-05-05 DIAGNOSIS — E7800 Pure hypercholesterolemia, unspecified: Secondary | ICD-10-CM | POA: Diagnosis not present

## 2017-05-05 NOTE — Progress Notes (Signed)
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 134, TG 82, HDL 55, LDL 73 ~  FLP 4/11 here on Lip20+Zetia? showed TChol 145, TG 74, HDL 68, LDL 63 ~  9/11:  she reports that DrTurner incr Lipitor to '40mg'$ , plus the Zetia '10mg'$ , and she does her FLPs. ~  FLP here 4/12 on Lip40+Zetia showed TChol 153, TG 54, HDL 53, LDL 90... Copy sent to DrTurner ~  [place] by [name] 7/13 on Lip40+Zetia10 showed TChol 137, TG 89, HDL 42, LDL 73 ~  9/14: on Lip40 => notes from [place] says ?Lip80 +[facility] study drug?; she has gained 4# to 190# today; FLP is followed by [facility] per pt & she reports concern for "particle size" & last avail FLP was 7/14 by [name] w/ TChol 190, HDL 100, LDL 40 ~  FLP followed by [name] for CARDS, and [name] for Endocrine...   FOLLOWED by [name] FOR ENDOCRINOLOGY >>   DIABETES MELLITUS  - on Metformin '500mg'$ Bid + PARLODEL 2.'5mg'$ /d per DrEllison... She is intol to Actos w/ edema, she stopped Januvia due to ?side effect, she wondered about Tajenta since her daugh works for [employer]. ~  labs 9/08 showed FBS=119, HgA1c=6.6 ~  labs 5/09 at [facility] study showed BS= 108, HgA1c= 6.5 ~  labs 3/10 showed BS= 113, A1c= 6.6 ~  labs 6/10 by DrTurner showed BS= 92; and 3/11 BS= 127 ~  labs 4/11 showed BS= 124, A1c= 7.6.[name]. rec> incr Metform to Bid; she had nutrition [facility] at [facility]... ~  6/11:  pt requested Endocrine  consult & seen by DrEllison w/ Januvia '100mg'$ /d added. ~  labs 9/11 showed BS= 98, A1c= 7.3.Marland KitchenMarland Kitchen she stopped Januvia due to side effects & wonders about using Tajenta instead. ~  DrEllison started Bromocyptine 2.'5mg'$ /d... She is now taking this +Metform '500mg'$ Bid... ~  Labs 4/12 (wt=195#) showed Bs= 116, A1c= 7.1 ~  Labs 7/12 showed A1c= 7.2;  Umicroalb= 3.5 ~  Labs 10/12 (wt=186#) showed A1c= 6.7 NOTE: She participated in a [facility] trial called the AfricanAmerican- Diabetes Heart Study in 2012; they sent a report indicating: 1) her memory was fine, but her MRI Brain showed sm vessel dis & mild sinus dis, otherw neg; 2) she may have treatable depression but we tried her on Zoloft & she  was intol==> ch to Lexapro. ~  followed by [name] on Metform500Bid & Onglyza2.5; labs from [place] 9/13 showed BS=201, A1c=6.0; he rec same meds... ~  9/14: followed by [name] on Metform500Bid & Onglyza2.5 => ?he changed to Kombiglyze5-1000 daily?; last labs from [place] 7/14 showed BS=105, A1c=6.5; and she remains on the same meds... ~  1/15: she had f/u [name] for Endocrine> DM, Obesity, Adrenal adenoma, VitB12 defic; labs reviewed, note reviewed- he tried Belviq but she was intol... ~  She continues regular follow up w/ [name]...   FOLLOWED by [name] for GASTORENTEROLOGY >>   GERD SYMPTOMS - pt placed on PROTONIX '40mg'$ /d w/ improvement in reflux symptoms...    ADDENDUM>> note from [name] indicates> upper endoscopy in July 1994 showed antral gastritis. 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  IRRITABLE BOWEL SYNDROME  - colonoscopy 7/03 by [name] was WNL. ~  9/14: she was referred to GI for f/u colon but never did it... ~  4/15: we will refer her to GI again...  RENAL CALCULUS  - sm stone seen in L kidney on CTScan... she had lithotripsy by [facility] ~9/09.  POLYMYOSITIS >> now followed by DrBeekman FIBROMYALGIA  - prev Rx by [name] in 2006... ~  1/14: This is  her CC w/ not resting well, wakes tired, poor energy, aching/ sore/ tender (esp in chest in AM) & we discussed trial Lunesta '2mg'$ Qhs, & Tramadol '50mg'$ Tid; plus rest, heat, etc... States she's INTOL Ambien w/ weird dreams & Benedryl/ Melatonin w/o help... ~  11/14: she went to the ER w/ LBP> felt to be a lumbar strain... ~  She was seen by [name] for Rheum [id number]...  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 supplemented w/ OTC Vit D 1000 u daily. ~  Vit D level was lower from [name] & he treated w/ Vit D 50K weekly for awhile, then switched to OTC supplement...  ANXIETY - on ALPRAZOLAM 0.'5mg'$  Prn... DEPRESSION - prev on Lexapro '20mg'$ /d but she stopped on her own ~10/13 ?cost issues? ~  4/12: c/o feeling sad & anxious all the time- weak, not resting well, under a lot of stress; rec to seek counselling thru her local church or let us refer to [facility], & start RX w/ Zoloft 50==> but she was INTOL... ~  2013: switched to Lexapro & seems to be doing better but didn't stick w/ it due to cost issues...  ANEMIA & LOW FERRITIN LEVEL >>  B12 DEFICIENCY >> treated by [name] w/ B12 shots ~  9/14: she tells me that Ferritin is low & [name] tried her on Oral Fe prep w/o improvement & wants me to f/u and treat this problem; we discussed need to recheck labs here- LABS 9/14 showed Hg=12.5, MCV=88, Fe=47 (12%sat), Ferritin=10.8; she is due for  f/u colon w/ [name] & we will refer, rec to  start FeSO4 '325mg'$  Bid w/ VitC500 w/ plans for f/u CBC, Fe studies in 2-44mo ~  4/15:  Labs showed Hg= 12.5 but Fe=31 (8%); she was only taking the Fe once daily 7 advised to incr to Bid w/ vitC500; needs f/u colon & we will refer to [name] again...  HEALTH MAINTENANCE: ~  GI:  Followed by [name] & colon due 7/13... ~  GYN:  Followed by DrDillard & she gets yearly PAP, Mammogram, hasn't had baseline BMD yet, [facility]... ~  Immunizations:  She gets the yearly Flu shots at school;  Had PNEUMOVAX previously; TDAP given 4/12...   Past Surgical History:  Procedure Laterality Date  . heart catherization    . TONSILLECTOMY    No other past surgical history on file - other than Lithotripsy for renal stone...   Outpatient Encounter Prescriptions as of 05/05/2017  Medication Sig  . albuterol (PROVENTIL HFA;VENTOLIN HFA) 108 (90 BASE) MCG/ACT inhaler Inhale 1-2 puffs into the lungs every 6 (six) hours as needed for wheezing or shortness of breath.  . ALPRAZolam (XANAX) 0.5 MG tablet take 1/2 tablet by mouth IN THE MID-MORNING AND MID-AFTERNOON, and then take 1 tablet once daily at bedtime if needed  . budesonide-formoterol (SYMBICORT) 80-4.5 MCG/ACT inhaler Inhale 2 puffs into the lungs 2 (two) times daily.  .Marland KitchenCARTIA XT 240 MG 24 hr capsule take 1 capsule by mouth once daily  . Cyanocobalamin 1000 MCG/ML KIT Inject 1,000 mg as directed every 30 (thirty) days.  . Evolocumab (REPATHA SURECLICK) 1253MG/ML SOAJ Inject 1 pen into the skin every 14 (fourteen) days.  . fluticasone (FLONASE) 50 MCG/ACT nasal spray Place 2 sprays into both nostrils at bedtime.  . furosemide (LASIX) 20 MG tablet Take 1 tablet (20 mg total) by mouth daily.  .Marland KitchenHP ACTHAR 80 UNIT/ML injectable gel Inject 80 Units into the skin 3 (three) times a week.   . Linagliptin-Metformin HCl (JENTADUETO) 2.03-999 MG TABS Take 2 tablets by mouth daily.  .Marland Kitchenlosartan (COZAAR)  50 MG tablet take 2 tablets by mouth daily  . metoprolol tartrate (LOPRESSOR) 25 MG tablet take 1 tablet by mouth twice a day  . nitroGLYCERIN (NITROSTAT) 0.4 MG SL tablet Place 1 tablet (0.4 mg total) under the tongue every 5 (five) minutes as needed for chest pain.  .Marland Kitchenondansetron (ZOFRAN-ODT) 4 MG disintegrating tablet Take 4 mg by mouth every 8 (eight) hours as needed for nausea or vomiting.  . pantoprazole (PROTONIX) 40 MG tablet Take 1 tablet (40 mg total) by mouth daily.  .Marland KitchenPREMPRO 0.3-1.5 MG tablet Take 1 tablet by mouth daily.  . valACYclovir (VALTREX) 500 MG tablet Take 500 mg by mouth daily.   . [DISCONTINUED] predniSONE (DELTASONE) 10 MG tablet 4 tabs for 2 days, then 3 tabs for 2 days, 2 tabs for 2 days, then 1 tab for 2 days, then stop (Patient not taking: Reported on 05/05/2017)  . [DISCONTINUED] Saxagliptin-Metformin (KOMBIGLYZE XR) 03-999 MG TB24 Take by mouth daily with lunch.   No facility-administered encounter medications on file as of 05/05/2017.     Allergies  Allergen Reactions  . Actos [Pioglitazone] Other (See Comments)    REACTION: pt states INTOL w/ edema  . Codeine Other (See Comments)    REACTION: vomiting  . Januvia [Sitagliptin] Nausea Only  . Lactose Intolerance (Gi) Diarrhea  . Morphine Nausea Only    REACTION: vomiting    Immunization History  Administered Date(s) Administered  . Influenza Split 08/16/2011, 08/16/2012, 08/16/2013,  09/04/2014  . Influenza Whole 09/16/2009, 08/15/2010  . Influenza,inj,Quad PF,36+ Mos 08/30/2016  . Influenza-Unspecified 08/19/2015  . Pneumococcal Polysaccharide-23 11/15/2004  . Tdap 02/22/2011    Current Medications, Allergies, Past Medical History, Past Surgical History, Family History, and Social History were reviewed in [employer] record.   Review of Systems        See HPI - all other systems neg except as noted...      The patient complains of weight gain, dyspnea on exertion, and  peripheral edema.  The patient denies anorexia, fever, weight loss, vision loss, decreased hearing, hoarseness, chest pain, syncope, prolonged cough, headaches, hemoptysis, abdominal pain, melena, hematochezia, severe indigestion/heartburn, hematuria, incontinence, genital sores, suspicious skin lesions, transient blindness, difficulty walking, depression, unusual weight change, abnormal bleeding, enlarged lymph nodes, and angioedema.     Objective:   Physical Exam     WD, Overweight, 61 y/o BF in NAD... GENERAL:  Alert & oriented; pleasant & cooperative. HEENT:  [place]/AT, EOM-wnl, PERRLA, EACs-clear, TMs-wnl, NOSE-clear, THROAT-clear & wnl. NECK:  Supple w/ fairROM; no JVD; normal carotid impulses w/o bruits; no thyromegaly or nodules palpated; no lymphadenopathy. CHEST:  decrBS at bases, few scar rhonchi & end-exp wheezing, no rales, no consolidation... HEART:  Regular Rhythm; without murmurs/ rubs/ or gallops. ABDOMEN:  Soft & nontender; normal bowel sounds; no organomegaly or masses detected. EXT: without deformities, mild arthritic changes; no varicose veins but +venous insuffic & tr edema; +trigger points. NEURO:  CN's intact;  no focal neuro deficits... DERM:  No lesions noted; no rash etc...  RADIOLOGY DATA:  Reviewed in the EPIC EMR & discussed w/ the patient...  LABORATORY DATA:  Reviewed in the EPIC EMR & discussed w/ the patient...   Assessment & Plan:    RESP>  Hx AR, HxAsthma, old sarcoid, mild OSA>  All stable, breathing OK, notes some wheezing w/ activity; exam reveals some secretions in the airway & rec to take [facility] '1200mg'$  bid, Fluids, etc;. CXR remains stable/ NAD... 10/20/15>  Presented w/ refractory AB- already on Pred- given Depo80, adding Levaquin500 x7d, ADVAIR100Bid, Mucinex600-2Bid + fluids... 02/02/16>  Presents w/ another refractory AB- currently off Pred & MTX, on Imuran; treated w/ Depo, Medrol taper, Augmentin, Mucinex, Albut=HFA...  04/2016>  Back to  baseline 11/04/16>  She has URI & we wrote for ZPak => subseq Augmentin, Saline, flonase, Mucinex... 05/05/17>   Jocelyn [name] is improved s/p Augmentin & Pred Rx for her URI & AB exac;  She remains on Symbicort80-2spBid + Mucinex600- 1to2Bid w/ extra fluids;  I reminded her to be sure to requests notes sent to [name] from [place] as they are not on Epic;  She will maintain f/u w/ these specialists + [name] for CARDS;  We plan recheck in 3-58mo  CARDS>  Followed by DrTTurner/ VRica Mote?on meds listed? SHE DID NOT BRING MEDS TO THE OV> Hx HBP, CAD, VI, etc;  Chest pain is felt to be non-cardiac & likely related to her FM; Doing satis & we reviewed exercise program etc...  CHOL>  [facility] + Zetia; she took part in a study drug from [facility] => [facility]..  DM>  Followed by [name] on Metformin, Onglyzal; A1c improved to 6.7 in 2012, then 6.0 in 2013, & 6.5  In 2014;  rec continue meds & [place]  GI>  Stable on Protonix as needed; see GI eval from [name]  7/15...  POLYMYOSITIS/ FM>  Diagnoses w/ Polymyositis recently (2016) & started on Pred, MTX, folate by DrBeekman=> switched to [employer] but insurance doesn't want to pay, she is reminded to have records sent to [name]...  Anxiety>  She remains on Alprazolam as needed; & she stopped the Lexapro..  Anemia, Low Ferritin> on FeSO4 '325mg'$  & VitC 500...   B12 Defic> on B12 shots per [name]...  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); due to this stress and her mult medical issues including polymyositis (DrBeekman), DM2 ([name]), heart problems ([name])- she tells me she is taking a LOA for the rest of this school yr & planning on retirement...   Patient's Medications  New Prescriptions   No medications on file  Previous Medications   ALBUTEROL (PROVENTIL HFA;VENTOLIN HFA) 108 (90  BASE) MCG/ACT INHALER    Inhale 1-2 puffs into the lungs every 6 (six) hours as needed for wheezing or shortness of breath.   ALPRAZOLAM (XANAX) 0.5 MG TABLET    take 1/2 tablet by mouth IN THE MID-MORNING AND MID-AFTERNOON, and then take 1 tablet once daily at bedtime if needed   BUDESONIDE-FORMOTEROL (SYMBICORT) 80-4.5 MCG/ACT INHALER    Inhale 2 puffs into the lungs 2 (two) times daily.   CARTIA XT 240 MG 24 HR CAPSULE    take 1 capsule by mouth once daily   CYANOCOBALAMIN 1000 MCG/ML KIT    Inject 1,000 mg as directed every 30 (thirty) days.   EVOLOCUMAB (REPATHA SURECLICK) 614 MG/ML SOAJ    Inject 1 pen into the skin every 14 (fourteen) days.   FLUTICASONE (FLONASE) 50 MCG/ACT NASAL SPRAY    Place 2 sprays into both nostrils at bedtime.   FUROSEMIDE (LASIX) 20 MG TABLET    Take 1 tablet (20 mg total) by mouth daily.   HP ACTHAR 80 UNIT/ML INJECTABLE GEL    Inject 80 Units into the skin 3 (three) times a week.    LINAGLIPTIN-METFORMIN HCL (JENTADUETO) 2.03-999 MG TABS    Take 2 tablets by mouth daily.   LOSARTAN (COZAAR) 50 MG TABLET    take 2 tablets by mouth daily   METOPROLOL TARTRATE (LOPRESSOR) 25 MG TABLET    take 1 tablet by mouth twice a day   NITROGLYCERIN (NITROSTAT) 0.4 MG SL TABLET    Place 1 tablet (0.4 mg total) under the tongue every 5 (five) minutes as needed for chest pain.   ONDANSETRON (ZOFRAN-ODT) 4 MG DISINTEGRATING TABLET    Take 4 mg by mouth every 8 (eight) hours as needed for nausea or vomiting.   PANTOPRAZOLE (PROTONIX) 40 MG TABLET    Take 1 tablet (40 mg total) by mouth daily.   PREMPRO 0.3-1.5 MG TABLET    Take 1 tablet by mouth daily.   VALACYCLOVIR (VALTREX) 500 MG TABLET    Take 500 mg by mouth daily.   Modified Medications   No medications on file  Discontinued Medications   PREDNISONE (DELTASONE) 10 MG TABLET    4 tabs for 2 days, then 3 tabs for 2 days, 2 tabs for 2 days, then 1 tab for 2 days, then stop   SAXAGLIPTIN-METFORMIN (KOMBIGLYZE XR) 03-999 MG  TB24    Take by mouth daily with lunch.

## 2017-05-05 NOTE — Patient Instructions (Signed)
Today we updated your med list in our EPIC system...    Continue your current medications the same...  Continue the SYMBICORT-80 at 2 inhalations twice daily...  For the congestion-- continue the [facility] 1200mg  twice daily w/ extra water by mouth...  Call for any questions...  Let's plan a follow up visit in 3-23mo, sooner if needed for any acute issues.Marland KitchenMarland Kitchen

## 2017-05-09 ENCOUNTER — Encounter: Payer: Self-pay | Admitting: Pulmonary Disease

## 2017-05-09 LAB — HM DIABETES EYE EXAM

## 2017-05-17 ENCOUNTER — Other Ambulatory Visit: Payer: Self-pay | Admitting: Interventional Cardiology

## 2017-06-15 ENCOUNTER — Inpatient Hospital Stay (HOSPITAL_COMMUNITY)
Admission: EM | Admit: 2017-06-15 | Discharge: 2017-06-18 | DRG: 247 | Disposition: A | Discharge status: Home / Self Care | Payer: BC Managed Care – PPO | Attending: Interventional Cardiology | Admitting: Interventional Cardiology

## 2017-06-15 ENCOUNTER — Encounter (HOSPITAL_COMMUNITY)
Admission: EM | Disposition: A | Discharge status: Home / Self Care | Payer: Self-pay | Source: Home / Self Care | Attending: Interventional Cardiology

## 2017-06-15 ENCOUNTER — Encounter (HOSPITAL_COMMUNITY): Payer: Self-pay | Admitting: *Deleted

## 2017-06-15 DIAGNOSIS — F419 Anxiety disorder, unspecified: Secondary | ICD-10-CM | POA: Diagnosis not present

## 2017-06-15 DIAGNOSIS — E782 Mixed hyperlipidemia: Secondary | ICD-10-CM | POA: Diagnosis not present

## 2017-06-15 DIAGNOSIS — E1159 Type 2 diabetes mellitus with other circulatory complications: Secondary | ICD-10-CM | POA: Diagnosis not present

## 2017-06-15 DIAGNOSIS — E119 Type 2 diabetes mellitus without complications: Secondary | ICD-10-CM

## 2017-06-15 DIAGNOSIS — M797 Fibromyalgia: Secondary | ICD-10-CM | POA: Diagnosis present

## 2017-06-15 DIAGNOSIS — E7800 Pure hypercholesterolemia, unspecified: Secondary | ICD-10-CM | POA: Diagnosis present

## 2017-06-15 DIAGNOSIS — I10 Essential (primary) hypertension: Secondary | ICD-10-CM | POA: Diagnosis present

## 2017-06-15 DIAGNOSIS — I340 Nonrheumatic mitral (valve) insufficiency: Secondary | ICD-10-CM | POA: Diagnosis not present

## 2017-06-15 DIAGNOSIS — E669 Obesity, unspecified: Secondary | ICD-10-CM | POA: Diagnosis present

## 2017-06-15 DIAGNOSIS — I2119 ST elevation (STEMI) myocardial infarction involving other coronary artery of inferior wall: Secondary | ICD-10-CM | POA: Diagnosis not present

## 2017-06-15 DIAGNOSIS — Z955 Presence of coronary angioplasty implant and graft: Secondary | ICD-10-CM | POA: Diagnosis not present

## 2017-06-15 DIAGNOSIS — Z79899 Other long term (current) drug therapy: Secondary | ICD-10-CM | POA: Diagnosis not present

## 2017-06-15 DIAGNOSIS — Z6832 Body mass index (BMI) 32.0-32.9, adult: Secondary | ICD-10-CM

## 2017-06-15 DIAGNOSIS — G4733 Obstructive sleep apnea (adult) (pediatric): Secondary | ICD-10-CM | POA: Diagnosis present

## 2017-06-15 DIAGNOSIS — Z7984 Long term (current) use of oral hypoglycemic drugs: Secondary | ICD-10-CM | POA: Diagnosis not present

## 2017-06-15 DIAGNOSIS — I2111 ST elevation (STEMI) myocardial infarction involving right coronary artery: Principal | ICD-10-CM | POA: Diagnosis present

## 2017-06-15 DIAGNOSIS — I2510 Atherosclerotic heart disease of native coronary artery without angina pectoris: Secondary | ICD-10-CM | POA: Diagnosis present

## 2017-06-15 DIAGNOSIS — I213 ST elevation (STEMI) myocardial infarction of unspecified site: Secondary | ICD-10-CM | POA: Diagnosis not present

## 2017-06-15 DIAGNOSIS — Z9889 Other specified postprocedural states: Secondary | ICD-10-CM

## 2017-06-15 DIAGNOSIS — E118 Type 2 diabetes mellitus with unspecified complications: Secondary | ICD-10-CM

## 2017-06-15 HISTORY — PX: CORONARY STENT INTERVENTION: CATH118234

## 2017-06-15 HISTORY — PX: LEFT HEART CATH AND CORONARY ANGIOGRAPHY: CATH118249

## 2017-06-15 LAB — CBC WITH DIFFERENTIAL/PLATELET
Basophils Absolute: 0 10*3/uL (ref 0.0–0.1)
Basophils Relative: 0 %
EOS PCT: 0 %
Eosinophils Absolute: 0 10*3/uL (ref 0.0–0.7)
HCT: 39 % (ref 36.0–46.0)
Hemoglobin: 13.4 g/dL (ref 12.0–15.0)
LYMPHS ABS: 2.8 10*3/uL (ref 0.7–4.0)
LYMPHS PCT: 34 %
MCH: 30.8 pg (ref 26.0–34.0)
MCHC: 34.4 g/dL (ref 30.0–36.0)
MCV: 89.7 fL (ref 78.0–100.0)
MONO ABS: 0.6 10*3/uL (ref 0.1–1.0)
MONOS PCT: 8 %
Neutro Abs: 4.8 10*3/uL (ref 1.7–7.7)
Neutrophils Relative %: 58 %
PLATELETS: 245 10*3/uL (ref 150–400)
RBC: 4.35 MIL/uL (ref 3.87–5.11)
RDW: 13.6 % (ref 11.5–15.5)
WBC: 8.2 10*3/uL (ref 4.0–10.5)

## 2017-06-15 LAB — GLUCOSE, CAPILLARY
GLUCOSE-CAPILLARY: 124 mg/dL — AB (ref 65–99)
Glucose-Capillary: 170 mg/dL — ABNORMAL HIGH (ref 65–99)

## 2017-06-15 LAB — LIPID PANEL
CHOL/HDL RATIO: 3.3 ratio
CHOLESTEROL: 182 mg/dL (ref 0–200)
HDL: 56 mg/dL (ref >40)
LDL Cholesterol: 93 mg/dL (ref 0–99)
TRIGLYCERIDES: 163 mg/dL — AB (ref <150)
VLDL: 33 mg/dL (ref 0–40)

## 2017-06-15 LAB — POCT I-STAT, CHEM 8
BUN: 13 mg/dL (ref 6–20)
CREATININE: 0.5 mg/dL (ref 0.44–1.00)
Calcium, Ion: 1.14 mmol/L — ABNORMAL LOW (ref 1.15–1.40)
Chloride: 103 mmol/L (ref 101–111)
Glucose, Bld: 148 mg/dL — ABNORMAL HIGH (ref 65–99)
HCT: 30 % — ABNORMAL LOW (ref 36.0–46.0)
HEMOGLOBIN: 10.2 g/dL — AB (ref 12.0–15.0)
Potassium: 3.2 mmol/L — ABNORMAL LOW (ref 3.5–5.1)
Sodium: 138 mmol/L (ref 135–145)
TCO2: 22 mmol/L (ref 0–100)

## 2017-06-15 LAB — POCT I-STAT TROPONIN I: Troponin i, poc: 0.01 ng/mL (ref 0.00–0.08)

## 2017-06-15 LAB — BASIC METABOLIC PANEL
ANION GAP: 14 (ref 5–15)
BUN: 13 mg/dL (ref 6–20)
CALCIUM: 9.2 mg/dL (ref 8.9–10.3)
CO2: 19 mmol/L — AB (ref 22–32)
CREATININE: 0.84 mg/dL (ref 0.44–1.00)
Chloride: 108 mmol/L (ref 101–111)
GFR calc Af Amer: >60 mL/min (ref >60)
GFR calc non Af Amer: >60 mL/min (ref >60)
GLUCOSE: 173 mg/dL — AB (ref 65–99)
Potassium: 3.9 mmol/L (ref 3.5–5.1)
Sodium: 141 mmol/L (ref 135–145)

## 2017-06-15 LAB — TROPONIN I

## 2017-06-15 LAB — PROTIME-INR
INR: 1.07
Prothrombin Time: 13.9 seconds (ref 11.4–15.2)

## 2017-06-15 LAB — POCT ACTIVATED CLOTTING TIME: Activated Clotting Time: 307 seconds

## 2017-06-15 LAB — MRSA PCR SCREENING: MRSA BY PCR: NEGATIVE

## 2017-06-15 LAB — APTT: aPTT: 20 seconds — ABNORMAL LOW (ref 24–36)

## 2017-06-15 SURGERY — LEFT HEART CATH AND CORONARY ANGIOGRAPHY
Anesthesia: LOCAL

## 2017-06-15 MED ORDER — MIDAZOLAM HCL 2 MG/2ML IJ SOLN
INTRAMUSCULAR | Status: AC
  Filled 2017-06-15: qty 2

## 2017-06-15 MED ORDER — NITROGLYCERIN 1 MG/10 ML FOR IR/CATH LAB
INTRA_ARTERIAL | Status: AC
  Filled 2017-06-15: qty 10

## 2017-06-15 MED ORDER — NITROGLYCERIN 0.4 MG SL SUBL: 0.4000 mg | SUBLINGUAL_TABLET | SUBLINGUAL | Status: DC | PRN

## 2017-06-15 MED ORDER — ASPIRIN 81 MG PO CHEW
324.0000 mg | CHEWABLE_TABLET | Freq: Once | ORAL | Status: AC
  Administered 2017-06-15: 324 mg via ORAL

## 2017-06-15 MED ORDER — LIDOCAINE HCL 1 % IJ SOLN
INTRAMUSCULAR | Status: AC
  Filled 2017-06-15: qty 20

## 2017-06-15 MED ORDER — ACETAMINOPHEN 325 MG PO TABS
650.0000 mg | ORAL_TABLET | ORAL | Status: DC | PRN
  Administered 2017-06-16 – 2017-06-18 (×4): 650 mg via ORAL
  Filled 2017-06-15 (×3): qty 2

## 2017-06-15 MED ORDER — TIROFIBAN HCL IN NACL 5-0.9 MG/100ML-% IV SOLN
INTRAVENOUS | Status: AC | PRN
  Administered 2017-06-15: 0.15 ug/kg/min via INTRAVENOUS

## 2017-06-15 MED ORDER — TICAGRELOR 90 MG PO TABS
ORAL_TABLET | ORAL | Status: DC | PRN
Start: 2017-06-15 — End: 2017-06-15
  Administered 2017-06-15: 180 mg via ORAL

## 2017-06-15 MED ORDER — ONDANSETRON 4 MG PO TBDP
4.0000 mg | ORAL_TABLET | Freq: Three times a day (TID) | ORAL | Status: DC | PRN
  Filled 2017-06-15: qty 1

## 2017-06-15 MED ORDER — VALACYCLOVIR HCL 500 MG PO TABS
500.0000 mg | ORAL_TABLET | Freq: Every day | ORAL | Status: DC
  Administered 2017-06-15 – 2017-06-18 (×4): 500 mg via ORAL
  Filled 2017-06-15 (×4): qty 1

## 2017-06-15 MED ORDER — NITROGLYCERIN 1 MG/10 ML FOR IR/CATH LAB
INTRA_ARTERIAL | Status: DC | PRN
  Administered 2017-06-15: 400 ug via INTRA_ARTERIAL
  Administered 2017-06-15: 200 ug via INTRACORONARY

## 2017-06-15 MED ORDER — HEPARIN SODIUM (PORCINE) 5000 UNIT/ML IJ SOLN
4000.0000 [IU] | Freq: Once | INTRAMUSCULAR | Status: AC
  Administered 2017-06-15: 4000 [IU] via INTRAVENOUS
  Filled 2017-06-15: qty 1

## 2017-06-15 MED ORDER — ONDANSETRON HCL 4 MG/2ML IJ SOLN
4.0000 mg | Freq: Four times a day (QID) | INTRAMUSCULAR | Status: DC | PRN
  Administered 2017-06-15 – 2017-06-17 (×2): 4 mg via INTRAVENOUS
  Filled 2017-06-15: qty 2

## 2017-06-15 MED ORDER — TICAGRELOR 90 MG PO TABS
ORAL_TABLET | ORAL | Status: AC
  Filled 2017-06-15: qty 2

## 2017-06-15 MED ORDER — HEPARIN (PORCINE) IN NACL 2-0.9 UNIT/ML-% IJ SOLN
INTRAMUSCULAR | Status: DC | PRN
  Administered 2017-06-15 (×2): 10 mL via INTRA_ARTERIAL

## 2017-06-15 MED ORDER — SODIUM CHLORIDE 0.9% FLUSH
3.0000 mL | Freq: Two times a day (BID) | INTRAVENOUS | Status: DC
  Administered 2017-06-15 – 2017-06-17 (×4): 3 mL via INTRAVENOUS

## 2017-06-15 MED ORDER — TIROFIBAN HCL IN NACL 5-0.9 MG/100ML-% IV SOLN: 0.1500 ug/kg/min | INTRAVENOUS | Status: AC

## 2017-06-15 MED ORDER — FENTANYL CITRATE (PF) 100 MCG/2ML IJ SOLN
INTRAMUSCULAR | Status: AC
  Filled 2017-06-15: qty 2

## 2017-06-15 MED ORDER — LABETALOL HCL 5 MG/ML IV SOLN: 10.0000 mg | INTRAVENOUS | Status: AC | PRN

## 2017-06-15 MED ORDER — HEPARIN SODIUM (PORCINE) 1000 UNIT/ML IJ SOLN
INTRAMUSCULAR | Status: AC
  Filled 2017-06-15: qty 1

## 2017-06-15 MED ORDER — HYDRALAZINE HCL 20 MG/ML IJ SOLN: 5.0000 mg | INTRAMUSCULAR | Status: AC | PRN

## 2017-06-15 MED ORDER — PANTOPRAZOLE SODIUM 40 MG PO TBEC
40.0000 mg | DELAYED_RELEASE_TABLET | Freq: Every day | ORAL | Status: DC
  Administered 2017-06-16 – 2017-06-18 (×3): 40 mg via ORAL
  Filled 2017-06-15 (×3): qty 1

## 2017-06-15 MED ORDER — HEPARIN (PORCINE) IN NACL 2-0.9 UNIT/ML-% IJ SOLN
INTRAMUSCULAR | Status: AC | PRN
  Administered 2017-06-15: 1000 mL

## 2017-06-15 MED ORDER — TICAGRELOR 90 MG PO TABS
90.0000 mg | ORAL_TABLET | Freq: Two times a day (BID) | ORAL | Status: DC
  Administered 2017-06-15 – 2017-06-18 (×6): 90 mg via ORAL
  Filled 2017-06-15 (×6): qty 1

## 2017-06-15 MED ORDER — ALBUTEROL SULFATE (2.5 MG/3ML) 0.083% IN NEBU: 2.5000 mg | INHALATION_SOLUTION | Freq: Four times a day (QID) | RESPIRATORY_TRACT | Status: DC | PRN

## 2017-06-15 MED ORDER — VERAPAMIL HCL 2.5 MG/ML IV SOLN
INTRAVENOUS | Status: AC
  Filled 2017-06-15: qty 2

## 2017-06-15 MED ORDER — DILTIAZEM HCL ER COATED BEADS 240 MG PO CP24
240.0000 mg | ORAL_CAPSULE | Freq: Every day | ORAL | Status: DC
  Administered 2017-06-15 – 2017-06-18 (×4): 240 mg via ORAL
  Filled 2017-06-15 (×4): qty 1

## 2017-06-15 MED ORDER — CORTICOTROPIN 80 UNIT/ML IJ GEL: 80.0000 [IU] | INTRAMUSCULAR | Status: DC

## 2017-06-15 MED ORDER — CONJ ESTROG-MEDROXYPROGEST ACE 0.3-1.5 MG PO TABS: 1.0000 | ORAL_TABLET | Freq: Every day | ORAL | Status: DC

## 2017-06-15 MED ORDER — TIROFIBAN HCL IN NACL 5-0.9 MG/100ML-% IV SOLN
INTRAVENOUS | Status: AC
  Filled 2017-06-15: qty 100

## 2017-06-15 MED ORDER — HEPARIN SODIUM (PORCINE) 1000 UNIT/ML IJ SOLN
INTRAMUSCULAR | Status: DC | PRN
  Administered 2017-06-15: 5000 [IU] via INTRAVENOUS

## 2017-06-15 MED ORDER — IOPAMIDOL (ISOVUE-370) INJECTION 76%
INTRAVENOUS | Status: AC
  Filled 2017-06-15: qty 125

## 2017-06-15 MED ORDER — SODIUM CHLORIDE 0.9 % IV SOLN: INTRAVENOUS | Status: AC

## 2017-06-15 MED ORDER — ALPRAZOLAM 0.5 MG PO TABS
0.5000 mg | ORAL_TABLET | Freq: Every evening | ORAL | Status: DC | PRN
  Administered 2017-06-15: 0.5 mg via ORAL
  Filled 2017-06-15: qty 1

## 2017-06-15 MED ORDER — ALPRAZOLAM 0.25 MG PO TABS: 5.0000 mg | ORAL_TABLET | Freq: Every evening | ORAL | Status: DC | PRN

## 2017-06-15 MED ORDER — TIROFIBAN (AGGRASTAT) BOLUS VIA INFUSION
INTRAVENOUS | Status: DC | PRN
  Administered 2017-06-15: 2040 ug via INTRAVENOUS

## 2017-06-15 MED ORDER — INSULIN ASPART 100 UNIT/ML ~~LOC~~ SOLN
0.0000 [IU] | Freq: Three times a day (TID) | SUBCUTANEOUS | Status: DC
  Administered 2017-06-15 – 2017-06-17 (×4): 2 [IU] via SUBCUTANEOUS

## 2017-06-15 MED ORDER — MIDAZOLAM HCL 2 MG/2ML IJ SOLN
INTRAMUSCULAR | Status: DC | PRN
  Administered 2017-06-15: 1 mg via INTRAVENOUS

## 2017-06-15 MED ORDER — METOPROLOL TARTRATE 25 MG PO TABS
25.0000 mg | ORAL_TABLET | Freq: Two times a day (BID) | ORAL | Status: DC
  Administered 2017-06-15 – 2017-06-17 (×5): 25 mg via ORAL
  Filled 2017-06-15 (×5): qty 1

## 2017-06-15 MED ORDER — IOPAMIDOL (ISOVUE-370) INJECTION 76%
INTRAVENOUS | Status: DC | PRN
Start: 2017-06-15 — End: 2017-06-15
  Administered 2017-06-15: 100 mL via INTRA_ARTERIAL

## 2017-06-15 MED ORDER — MOMETASONE FURO-FORMOTEROL FUM 100-5 MCG/ACT IN AERO
2.0000 | INHALATION_SPRAY | Freq: Two times a day (BID) | RESPIRATORY_TRACT | Status: DC
  Administered 2017-06-15 – 2017-06-18 (×5): 2 via RESPIRATORY_TRACT
  Filled 2017-06-15: qty 8.8

## 2017-06-15 MED ORDER — FENTANYL CITRATE (PF) 100 MCG/2ML IJ SOLN
INTRAMUSCULAR | Status: DC | PRN
  Administered 2017-06-15 (×2): 25 ug via INTRAVENOUS

## 2017-06-15 MED ORDER — ASPIRIN 81 MG PO CHEW
81.0000 mg | CHEWABLE_TABLET | Freq: Every day | ORAL | Status: DC
  Administered 2017-06-16 – 2017-06-18 (×3): 81 mg via ORAL
  Filled 2017-06-15 (×3): qty 1

## 2017-06-15 MED ORDER — SODIUM CHLORIDE 0.9 % IV SOLN
INTRAVENOUS | Status: DC
  Administered 2017-06-15: 250 mL via INTRAVENOUS
  Administered 2017-06-15: 12:00:00 via INTRAVENOUS

## 2017-06-15 MED ORDER — ASPIRIN 81 MG PO CHEW
CHEWABLE_TABLET | ORAL | Status: AC
  Filled 2017-06-15: qty 4

## 2017-06-15 MED ORDER — HEPARIN (PORCINE) IN NACL 2-0.9 UNIT/ML-% IJ SOLN
INTRAMUSCULAR | Status: AC
  Filled 2017-06-15: qty 1000

## 2017-06-15 MED ORDER — LIDOCAINE HCL (PF) 1 % IJ SOLN
INTRAMUSCULAR | Status: DC | PRN
  Administered 2017-06-15: 2 mL

## 2017-06-15 MED ORDER — SODIUM CHLORIDE 0.9 % IV SOLN: 250.0000 mL | INTRAVENOUS | Status: DC | PRN

## 2017-06-15 MED ORDER — FLUTICASONE PROPIONATE 50 MCG/ACT NA SUSP
2.0000 | Freq: Every day | NASAL | Status: DC
  Administered 2017-06-15 – 2017-06-17 (×3): 2 via NASAL
  Filled 2017-06-15 (×2): qty 16

## 2017-06-15 MED ORDER — SODIUM CHLORIDE 0.9% FLUSH: 3.0000 mL | INTRAVENOUS | Status: DC | PRN

## 2017-06-15 SURGICAL SUPPLY — 16 items
BALLN SAPPHIRE ~~LOC~~ 3.75X12 (BALLOONS) ×2 IMPLANT
CATH EXTRAC PRONTO 5.5F 138CM (CATHETERS) ×2 IMPLANT
CATH INFINITI 5 FR JL3.5 (CATHETERS) ×2 IMPLANT
CATH LAUNCHER 6FR JR4 (CATHETERS) ×2 IMPLANT
CATHETER LAUNCHER 6FR NOTO (CATHETERS) ×2 IMPLANT
DEVICE RAD COMP TR BAND LRG (VASCULAR PRODUCTS) ×2 IMPLANT
GLIDESHEATH SLEND SS 6F .021 (SHEATH) ×2 IMPLANT
GUIDEWIRE INQWIRE 1.5J.035X260 (WIRE) ×2 IMPLANT
INQWIRE 1.5J .035X260CM (WIRE) ×4
KIT HEART LEFT (KITS) ×2 IMPLANT
PACK CARDIAC CATHETERIZATION (CUSTOM PROCEDURE TRAY) ×2 IMPLANT
STENT PROMUS PREM MR 3.5X16 (Permanent Stent) ×2 IMPLANT
TRANSDUCER W/STOPCOCK (MISCELLANEOUS) ×2 IMPLANT
TUBING CIL FLEX 10 FLL-RA (TUBING) ×2 IMPLANT
VALVE GUARDIAN II ~~LOC~~ HEMO (MISCELLANEOUS) ×2 IMPLANT
WIRE ASAHI PROWATER 180CM (WIRE) ×2 IMPLANT

## 2017-06-15 NOTE — ED Triage Notes (Signed)
Pt reports mid chest discomfort since this am. Has discomfort in both arms. Denies sob or swelling to extremities. Airway intact and ekg done.

## 2017-06-15 NOTE — H&P (Signed)
History & Physical    Patient ID: Jocelyn [name] MRN: [id number], DOB/AGE: 1956-09-22   Admit date: 06/15/2017  Primary Physician: Noralee Space, MD Primary Cardiologist: Irish Lack  Patient Profile    61 yo female with PMH of nonobstructive CAD, HTN, NIDDM, HL and obesity who presented with chest pain and Code STEMI activated.   Past Medical History   Past Medical History:  Diagnosis Date  . Acute bronchitis   . Allergic rhinitis, cause unspecified   . Anemia   . Anxiety   . B12 deficiency   . BV (bacterial vaginosis) 06/22/1996  . Calculus of kidney   . Calculus of kidney   . Coronary atherosclerosis of unspecified type of vessel, native or graft   . Dizziness   . Essential hypertension, benign   . Fibroid 2003  . Fibromyalgia   . H/O dysmenorrhea 2008  . H/O varicella   . Headache(784.0)    frequently  . HSV-2 infection 2009  . Hyperplastic colon polyp 05/16/2014  . Irritable bowel syndrome   . Meniere's disease, unspecified   . Menses, irregular 2003  . Myalgia and myositis, unspecified   . Obstructive sleep apnea (adult) (pediatric)   . Perimenopausal symptoms 2003  . Pure hypercholesterolemia   . Sarcoidosis   . Type II or unspecified type diabetes mellitus without mention of complication, not stated as uncontrolled   . Unspecified venous (peripheral) insufficiency   . Vitamin D deficiency   . Vulvitis 2010  . Yeast infection     Past Surgical History:  Procedure Laterality Date  . heart catherization    . TONSILLECTOMY       Allergies  Allergies  Allergen Reactions  . Actos [Pioglitazone] Other (See Comments)    REACTION: pt states INTOL w/ edema  . Codeine Other (See Comments)    REACTION: vomiting  . Januvia [Sitagliptin] Nausea Only  . Lactose Intolerance (Gi) Diarrhea  . Morphine Nausea Only    REACTION: vomiting    History of Present Illness    Jocelyn [name] is a 61 yo female with PMH of nonobstructive CAD, HTN, NIDDM, HL and obesity. She  is followed by Dr. Irish Lack in the outpatient setting. Previous cath in 2015 with possible disease in branch vessel. Last seen in the office on 1/18 and reported being at her baseline. Hx of statin intolerance and was on Repatha, but stopped 2/2 to cost. Was suppose to resumed once back on insurance.   Reports she developed chest pain last evening that worsened throughout the evening. States the pain woke her up this morning around 4am, and then worsened again at 9am. Presented to the [facility] with symptoms. EKG showed acute ST elevation in the inferior leads. She was given 34m asa, and 4000 units of heparin and brought to the cath lab for emergent cath.   Home Medications    Prior to Admission medications   Medication Sig Start Date End Date Taking? Authorizing Provider  albuterol (PROVENTIL HFA;VENTOLIN HFA) 108 (90 BASE) MCG/ACT inhaler Inhale 1-2 puffs into the lungs every 6 (six) hours as needed for wheezing or shortness of breath. 09/15/15   NNoralee Space MD  ALPRAZolam (Duanne Moron 0.5 MG tablet take 1/2 tablet by mouth IN THE MID-MORNING AND MID-AFTERNOON, and then take 1 tablet once daily at bedtime if needed 04/27/17   NNoralee Space MD  budesonide-formoterol ([facility] 80-4.5 MCG/ACT inhaler Inhale 2 puffs into the lungs 2 (two) times daily. 04/12/17   Parrett, TFonnie Mu  NP  CARTIA XT 240 MG 24 hr capsule take 1 capsule by mouth once daily 01/06/17   Jettie Booze, MD  Cyanocobalamin 1000 MCG/ML KIT Inject 1,000 mg as directed every 30 (thirty) days.    [provider]  Evolocumab (REPATHA SURECLICK) 694 MG/ML SOAJ Inject 1 pen into the skin every 14 (fourteen) days. 04/07/17   Jettie Booze, MD  fluticasone [facility]) 50 MCG/ACT nasal spray Place 2 sprays into both nostrils at bedtime. 12/16/16   Noralee Space, MD  furosemide (LASIX) 20 MG tablet Take 1 tablet (20 mg total) by mouth daily. 03/26/16   Burtis Junes, NP  HP ACTHAR 80 UNIT/ML injectable gel Inject 80  Units into the skin 3 (three) times a week.  05/27/16   [provider]  Linagliptin-Metformin HCl (JENTADUETO) 2.03-999 MG TABS Take 2 tablets by mouth daily.    [provider]  losartan (COZAAR) 50 MG tablet take 2 tablets by mouth once daily 05/17/17   Jettie Booze, MD  metoprolol tartrate (LOPRESSOR) 25 MG tablet take 1 tablet by mouth twice a day 04/19/17   Jettie Booze, MD  nitroGLYCERIN (NITROSTAT) 0.4 MG SL tablet Place 1 tablet (0.4 mg total) under the tongue every 5 (five) minutes as needed for chest pain. 12/09/16   Jettie Booze, MD  ondansetron (ZOFRAN-ODT) 4 MG disintegrating tablet Take 4 mg by mouth every 8 (eight) hours as needed for nausea or vomiting.    [provider]  pantoprazole (PROTONIX) 40 MG tablet Take 1 tablet (40 mg total) by mouth daily. 11/04/16   Noralee Space, MD  PREMPRO 0.3-1.5 MG tablet Take 1 tablet by mouth daily. 05/09/16   [provider]  valACYclovir (VALTREX) 500 MG tablet Take 500 mg by mouth daily.  10/01/13   [provider]    Family History    Family History  Problem Relation Age of Onset  . Adopted: Yes  . Other Unknown        ADOPTED  . Colon cancer Neg Hx   . Esophageal cancer Neg Hx   . Rectal cancer Neg Hx   . Stomach cancer Neg Hx     Social History    Social History   Social History  . Marital status: Single    Spouse name: N/A  . Number of children: 1  . Years of education: N/A   Occupational History  . Not on file.   Social History Main Topics  . Smoking status: Never Smoker  . Smokeless tobacco: Never Used     Comment: Daily Caffeine - 1  Exercise 2-3 times/weekly  . Alcohol use No  . Drug use: No  . Sexual activity: Yes    Birth control/ protection: None   Other Topics Concern  . Not on file   Social History Narrative   Exercises 2-3 times weekly   Caffeine Use: 1 daily (tea or Pepsi)   Lives alone in a one story home.  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   Teaches kindergarten.       Review of Systems    See HPI  All other systems reviewed and are otherwise negative except as noted above.  Physical Exam    Blood pressure (!) 152/99, pulse 86, temperature 98.3 F (36.8 C), temperature source Oral, resp. rate 18, height 5' 2" (1.575 m), weight 180 lb (81.6 kg), SpO2 95 %.  General: Older obese AAF NAD Psych: Normal affect. Neuro: Alert and oriented X 3.  Moves all extremities spontaneously. HEENT: Normal  Neck: Supple without bruits or JVD. Lungs:  Resp regular and unlabored, CTA. Heart: RRR no s3, s4, or murmurs. Abdomen: Soft, non-tender, non-distended, BS + x 4.  Extremities: No clubbing, cyanosis or edema. DP/PT/Radials 2+ and equal bilaterally.  Labs    Troponin (Point of Care Test) No results for input(s): TROPIPOC in the last 72 hours. No results for input(s): CKTOTAL, CKMB, TROPONINI in the last 72 hours. Lab Results  Component Value Date   WBC 6.2 03/26/2016   HGB 12.8 03/26/2016   HCT 37.5 03/26/2016   MCV 89.5 03/26/2016   PLT 252 03/26/2016   No results for input(s): NA, K, CL, CO2, BUN, CREATININE, CALCIUM, PROT, BILITOT, ALKPHOS, ALT, AST, GLUCOSE in the last 168 hours.  Invalid input(s): LABALBU Lab Results  Component Value Date   CHOL 191 08/18/2016   HDL 41 (L) 08/18/2016   LDLCALC 107 08/18/2016   TRIG 214 (H) 08/18/2016   No results found for: [facility]   Radiology Studies    No results found.  ECG & Cardiac Imaging    EKG: SR with acute ST elevation in inferior leads  Echo: 04/09/16  Study Conclusions  - Left ventricle: The cavity size was normal. There was moderate   concentric hypertrophy. Systolic function was vigorous. The   estimated ejection fraction was in the range of 65% to 70%. Wall   motion was normal; there were no regional wall motion   abnormalities. Doppler parameters are consistent with abnormal   left ventricular relaxation (grade 1 diastolic dysfunction).   There was no  evidence of elevated ventricular filling pressure by   Doppler parameters. - Aortic valve: Trileaflet; normal thickness leaflets. There was no   regurgitation. - Aortic root: The aortic root was normal in size. - Mitral valve: Structurally normal valve. There was trivial   regurgitation. - Right ventricle: The cavity size was normal. Wall thickness was   normal. Systolic function was normal. - Right atrium: The atrium was normal in size. - Tricuspid valve: There was no regurgitation. - Pulmonary arteries: Systolic pressure was within the normal   range. - Inferior vena cava: The vessel was normal in size. - Pericardium, extracardiac: There was no pericardial effusion.  Assessment & Plan    61 yo female with PMH of nonobstructive CAD, HTN, NIDDM, HL and obesity who presented with chest pain and Code STEMI activated.  1.STEMI: Chest pain that started last evening and continued into this morning. EKG showed ST elevation in the inferior leads. Given 355m ASA, and 4000units heparin in the ED. Brought to the lab for emergent cardiac cath with Dr. VIrish Lack  -- further recommendations post cath.   2. HTN: Home medication include BB, ARB. Resume post cath if blood pressure stable  3. HL: statin intolerant, on repatha as outpatient  4. NIDDM: hold jentadueto (linagliptin/metformin) with recent contrast dye -- SSI while inpatient  Signed, LReino Bellis NP-C Pager [phone] 09268/11/2016, 12:23 PM   I have examined the patient and reviewed assessment and plan and discussed with patient.  Agree with above as stated.  Patient with acute inferior ST elevation MI. I personally reviewed the ECG prior to her cardiac cath. She was having pain that started last night but that resolved. She then had another episode at about 11 AM which prompted her to come to the emergency room. She has been having some back pain over the past few weeks. The pain that she had bring her to  the hospital was chest pain  associated with significant diaphoresis.  In the Cath Lab, she underwent PCI of the RCA. She now has a drug-eluting stent to distal RCA. She will continue IV tirofiban in the ICU for 2 hours. Continue aggressive secondary prevention as her blood pressure allows. Blood pressure was somewhat low so she was not initially started on a beta blocker. ARB is being held. She has been statin intolerant. She is taking Repatha. Last dose of Repatha was yesterday.  Larae Grooms

## 2017-06-15 NOTE — ED Provider Notes (Signed)
[place] DEPT Provider Note   CSN: [id number] Arrival date & time: 06/15/17  1145     History   Chief Complaint Chief Complaint  Patient presents with  . Code STEMI  . Shortness of Breath  . Chest Pain    HPI Jocelyn [name] is a 61 y.o. female.  Patient w hx cad, states awoke with severe mid chest pain this AM. Pain constant, dull, severe, non radiating, not pleuritic.  +mild sob. Nausea. No vomiting. States had cath many years ago with blockage, but no stent.  No other recent cp but intermittent mid back for 1 month.     The history is provided by the patient.    Past Medical History:  Diagnosis Date  . Acute bronchitis   . Allergic rhinitis, cause unspecified   . Anemia   . Anxiety   . B12 deficiency   . BV (bacterial vaginosis) 06/22/1996  . Calculus of kidney   . Calculus of kidney   . Coronary atherosclerosis of unspecified type of vessel, native or graft   . Dizziness   . Essential hypertension, benign   . Fibroid 2003  . Fibromyalgia   . H/O dysmenorrhea 2008  . H/O varicella   . Headache(784.0)    frequently  . HSV-2 infection 2009  . Hyperplastic colon polyp 05/16/2014  . Irritable bowel syndrome   . Meniere's disease, unspecified   . Menses, irregular 2003  . Myalgia and myositis, unspecified   . Obstructive sleep apnea (adult) (pediatric)   . Perimenopausal symptoms 2003  . Pure hypercholesterolemia   . Sarcoidosis   . Type II or unspecified type diabetes mellitus without mention of complication, not stated as uncontrolled   . Unspecified venous (peripheral) insufficiency   . Vitamin D deficiency   . Vulvitis 2010  . Yeast infection     Patient Active Problem List   Diagnosis Date Noted  . Overweight 05/05/2017  . Diabetes mellitus type 2, controlled, without complications ([place]) 84/53/6468  . Asthmatic bronchitis , chronic ([place]) 05/10/2016  . Polymyositis ([place]) 09/15/2015  . History of sarcoidosis 09/15/2015  . Falling 03/13/2015  .  Elevated CPK 03/13/2015  . Edema 02/13/2014  . Coronary atherosclerosis of native coronary artery 10/29/2013  . Mixed hyperlipidemia 10/29/2013  . Upper respiratory tract infection 08/08/2013  . Iron (Fe) deficiency anemia 08/08/2013  . Vitamin B 12 deficiency 08/08/2013  . Menopausal symptoms 03/23/2012  . GERD (gastroesophageal reflux disease) 09/08/2011  . Weakness 02/22/2011  . Depression 02/22/2011  . [place] DISEASE 04/30/2009  . VITAMIN D DEFICIENCY 02/05/2009  . Acute bronchitis 09/10/2008  . Obstructive sleep apnea 12/12/2007  . Venous (peripheral) insufficiency 12/12/2007  . RENAL CALCULUS 12/12/2007  . DIZZINESS 12/12/2007  . HYPERCHOLESTEROLEMIA 09/16/2007  . Anxiety state 09/16/2007  . HYPERTENSION, BENIGN 09/16/2007  . Allergic rhinitis 09/16/2007  . IRRITABLE BOWEL SYNDROME 09/16/2007  . Myalgia and myositis 09/16/2007    Past Surgical History:  Procedure Laterality Date  . heart catherization    . TONSILLECTOMY      OB History    Gravida Para Term Preterm AB Living   _0 SAB TAB Ectopic Multiple Live Births           1       Home Medications    Prior to Admission medications   Medication Sig Start Date End Date Taking? Authorizing Provider  albuterol (PROVENTIL HFA;VENTOLIN HFA) 108 (90 BASE) MCG/ACT inhaler Inhale 1-2 puffs  into the lungs every 6 (six) hours as needed for wheezing or shortness of breath. 09/15/15   Noralee Space, MD  ALPRAZolam Duanne Moron) 0.5 MG tablet take 1/2 tablet by mouth IN THE MID-MORNING AND MID-AFTERNOON, and then take 1 tablet once daily at bedtime if needed 04/27/17   Noralee Space, MD  budesonide-formoterol [facility]) 80-4.5 MCG/ACT inhaler Inhale 2 puffs into the lungs 2 (two) times daily. 04/12/17   Parrett, Fonnie Mu, NP  CARTIA XT 240 MG 24 hr capsule take 1 capsule by mouth once daily 01/06/17   Jettie Booze, MD  Cyanocobalamin 1000 MCG/ML KIT Inject 1,000 mg as directed every 30 (thirty) days.     [provider]  Evolocumab (REPATHA SURECLICK) 416 MG/ML SOAJ Inject 1 pen into the skin every 14 (fourteen) days. 04/07/17   Jettie Booze, MD  fluticasone [facility]) 50 MCG/ACT nasal spray Place 2 sprays into both nostrils at bedtime. 12/16/16   Noralee Space, MD  furosemide (LASIX) 20 MG tablet Take 1 tablet (20 mg total) by mouth daily. 03/26/16   Burtis Junes, NP  HP ACTHAR 80 UNIT/ML injectable gel Inject 80 Units into the skin 3 (three) times a week.  05/27/16   [provider]  Linagliptin-Metformin HCl (JENTADUETO) 2.03-999 MG TABS Take 2 tablets by mouth daily.    [provider]  losartan (COZAAR) 50 MG tablet take 2 tablets by mouth once daily 05/17/17   Jettie Booze, MD  metoprolol tartrate (LOPRESSOR) 25 MG tablet take 1 tablet by mouth twice a day 04/19/17   Jettie Booze, MD  nitroGLYCERIN (NITROSTAT) 0.4 MG SL tablet Place 1 tablet (0.4 mg total) under the tongue every 5 (five) minutes as needed for chest pain. 12/09/16   Jettie Booze, MD  ondansetron (ZOFRAN-ODT) 4 MG disintegrating tablet Take 4 mg by mouth every 8 (eight) hours as needed for nausea or vomiting.    [provider]  pantoprazole (PROTONIX) 40 MG tablet Take 1 tablet (40 mg total) by mouth daily. 11/04/16   Noralee Space, MD  PREMPRO 0.3-1.5 MG tablet Take 1 tablet by mouth daily. 05/09/16   [provider]  valACYclovir (VALTREX) 500 MG tablet Take 500 mg by mouth daily.  10/01/13   [provider]    Family History Family History  Problem Relation Age of Onset  . Adopted: Yes  . Other Unknown        ADOPTED  . Colon cancer Neg Hx   . Esophageal cancer Neg Hx   . Rectal cancer Neg Hx   . Stomach cancer Neg Hx     Social History Social History  Substance Use Topics  . Smoking status: Never Smoker  . Smokeless tobacco: Never Used     Comment: Daily Caffeine - 1  Exercise 2-3 times/weekly  . Alcohol use No     Allergies    Actos [pioglitazone]; Codeine; Januvia [sitagliptin]; Lactose intolerance (gi); and Morphine   Review of Systems Review of Systems  Constitutional: Negative for chills and fever.  Eyes: Negative for redness.  Respiratory: Positive for shortness of breath. Negative for cough.   Cardiovascular: Positive for chest pain.  Gastrointestinal: Positive for nausea. Negative for abdominal pain.  Endocrine: Negative for polyuria.  Genitourinary: Negative for flank pain.  Musculoskeletal: Negative for back pain and neck pain.  Skin: Negative for rash.  Neurological: Negative for headaches.  Hematological: Does not bruise/bleed easily.  Psychiatric/Behavioral: Negative for confusion.  Physical Exam Updated Vital Signs BP (!) 152/99 (BP Location: Right Arm)   Pulse 86   Temp 98.3 F (36.8 C) (Oral)   Resp 18   Ht 1.575 m (_0 )   Wt 81.6 kg (180 lb)   SpO2 98%   BMI 32.92 kg/m   Physical Exam  Constitutional: She appears well-developed and well-nourished.  HENT:  Mouth/Throat: Oropharynx is clear and moist.  Eyes: Conjunctivae are normal. No scleral icterus.  Neck: Neck supple. No tracheal deviation present.  Cardiovascular: Normal rate, regular rhythm, normal heart sounds and intact distal pulses.  Exam reveals no gallop and no friction rub.   No murmur heard. Pulmonary/Chest: Effort normal and breath sounds normal. No respiratory distress.  Abdominal: Soft. Normal appearance and bowel sounds are normal. She exhibits no distension. There is no tenderness.  Genitourinary:  Genitourinary Comments: No cva tenderness  Musculoskeletal: She exhibits no edema.  Neurological: She is alert.  Skin: Skin is warm and dry. No rash noted.  Psychiatric: She has a normal mood and affect.  Nursing note and vitals reviewed.    ED Treatments / Results  Labs (all labs ordered are listed, but only abnormal results are displayed) Results for orders placed or performed in visit on 05/09/17    HM DIABETES EYE EXAM  Result Value Ref Range   HM Diabetic Eye Exam No Retinopathy No Retinopathy    EKG St elev inferiorly/lat c/w stemi.   Radiology No results found.  Procedures Procedures (including critical care time)  Medications Ordered in ED Medications  0.9 %  sodium chloride infusion ( Intravenous New Bag/Given 06/15/17 1210)  aspirin 81 MG chewable tablet (not administered)  aspirin chewable tablet 324 mg (324 mg Oral Given 06/15/17 1209)  heparin injection 4,000 Units (4,000 Units Intravenous Given 06/15/17 1209)     Initial Impression / Assessment and Plan / ED Course  I have reviewed the triage vital signs and the nursing notes.  Pertinent labs & imaging results that were available during my care of the patient were reviewed by me and considered in my medical decision making (see chart for details).  Code stemi called.  Chewable asa.   Labs.   Cath lab activated.  Cardiology evaluated in ED.  Heparin per pharmacy.    Reviewed nursing notes and prior charts for additional history.     Final Clinical Impressions(s) / ED Diagnoses   Final diagnoses:  Acute ST elevation myocardial infarction (STEMI), unspecified artery [facility])    New Prescriptions New Prescriptions   No medications on file     Lajean Saver, MD 06/15/17 1214

## 2017-06-16 ENCOUNTER — Inpatient Hospital Stay (HOSPITAL_COMMUNITY): Payer: BC Managed Care – PPO

## 2017-06-16 DIAGNOSIS — I340 Nonrheumatic mitral (valve) insufficiency: Secondary | ICD-10-CM

## 2017-06-16 DIAGNOSIS — E1159 Type 2 diabetes mellitus with other circulatory complications: Secondary | ICD-10-CM

## 2017-06-16 LAB — GLUCOSE, CAPILLARY
GLUCOSE-CAPILLARY: 107 mg/dL — AB (ref 65–99)
GLUCOSE-CAPILLARY: 143 mg/dL — AB (ref 65–99)
GLUCOSE-CAPILLARY: 124 mg/dL — AB (ref 65–99)
Glucose-Capillary: 130 mg/dL — ABNORMAL HIGH (ref 65–99)

## 2017-06-16 LAB — BASIC METABOLIC PANEL
ANION GAP: 9 (ref 5–15)
BUN: 7 mg/dL (ref 6–20)
CALCIUM: 8.3 mg/dL — AB (ref 8.9–10.3)
CO2: 23 mmol/L (ref 22–32)
Chloride: 106 mmol/L (ref 101–111)
Creatinine, Ser: 0.71 mg/dL (ref 0.44–1.00)
GFR calc Af Amer: >60 mL/min (ref >60)
Glucose, Bld: 129 mg/dL — ABNORMAL HIGH (ref 65–99)
POTASSIUM: 3.3 mmol/L — AB (ref 3.5–5.1)
SODIUM: 138 mmol/L (ref 135–145)

## 2017-06-16 LAB — CBC
HCT: 34.8 % — ABNORMAL LOW (ref 36.0–46.0)
Hemoglobin: 11.6 g/dL — ABNORMAL LOW (ref 12.0–15.0)
MCH: 29.9 pg (ref 26.0–34.0)
MCHC: 33.3 g/dL (ref 30.0–36.0)
MCV: 89.7 fL (ref 78.0–100.0)
Platelets: 235 10*3/uL (ref 150–400)
RBC: 3.88 MIL/uL (ref 3.87–5.11)
RDW: 13.8 % (ref 11.5–15.5)
WBC: 9.5 10*3/uL (ref 4.0–10.5)

## 2017-06-16 LAB — ECHOCARDIOGRAM COMPLETE
HEIGHTINCHES: 62 in
Weight: 2880 oz

## 2017-06-16 LAB — TROPONIN I: TROPONIN I: 36.83 ng/mL — AB (ref <0.03)

## 2017-06-16 MED ORDER — ALPRAZOLAM 0.5 MG PO TABS: 0.5000 mg | ORAL_TABLET | Freq: Every evening | ORAL | Status: DC | PRN

## 2017-06-16 MED ORDER — POTASSIUM CHLORIDE CRYS ER 20 MEQ PO TBCR
40.0000 meq | EXTENDED_RELEASE_TABLET | Freq: Once | ORAL | Status: AC
  Administered 2017-06-16: 40 meq via ORAL
  Filled 2017-06-16: qty 2

## 2017-06-16 MED ORDER — LOSARTAN POTASSIUM 25 MG PO TABS
25.0000 mg | ORAL_TABLET | Freq: Every day | ORAL | Status: DC
  Administered 2017-06-16 – 2017-06-18 (×3): 25 mg via ORAL
  Filled 2017-06-16 (×3): qty 1

## 2017-06-16 MED FILL — Gentamicin Sulfate Inj 40 MG/ML: INTRAMUSCULAR | Qty: 2 | Status: AC

## 2017-06-16 MED FILL — Sodium Chloride Irrigation Soln 0.9%: Qty: 500 | Status: AC

## 2017-06-16 NOTE — Progress Notes (Signed)
Per Clinical biochemist for [employer] # [facility]. S/W [place] @ [facility] # [phone]    BRILINTA  90 MG BID   COVER- YES  CO-PAY- ZERO DOLLARS  PRIOR APPROVAL- NO

## 2017-06-16 NOTE — Care Management Note (Signed)
Case Management Note Marvetta Gibbons RN, BSN Unit 4E-Case Manager-- [place] coverage [phone]  Patient Details  Name: RYLAH FUKUDA MRN: [id number] Date of Birth: 26-Jul-1956  Subjective/Objective:  Pt admitted with STEMI s/p DES                  Action/Plan: PTA Pt lived at home- anticipate return home, has good family support- started on Brilinta- insurance coverage checked- copay $0 - spoke with pt at bedside-coverage info shared - per pt she uses [facility] for some specialty meds- otherwise local pharmacy is [facility] on [address]-  She has a Scientist, forensic with [facility] prescription plan- pt given the Brilinta card for info will not need to use copay assist card and 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   Expected Discharge Date:                  Expected Discharge Plan:  Home/Self Care  In-House Referral:     Discharge planning Services  CM Consult, Medication Assistance  Post Acute Care Choice:  NA Choice offered to:  NA  DME Arranged:    DME Agency:     HH Arranged:    [place] Agency:     Status of Service:  Completed, signed off  If discussed at [employer] of Stay Meetings, dates discussed:    Additional Comments:  Dawayne Patricia, RN 06/16/2017, 4:33 PM

## 2017-06-16 NOTE — Progress Notes (Signed)
CARDIAC REHAB PHASE I   PRE:  Rate/Rhythm: 88 SR    BP: sitting 114/70    SaO2:   MODE:  Ambulation: 410 ft   POST:  Rate/Rhythm: 107 ST    BP: sitting 106/86     SaO2:   Pt tolerated fairly well with slow pace. She does c/o right mid back pain that wraps around her ribs to front. Sts its 6/10 at times. She sts this has actually been present 2 weeks and she had initially saw her urologist without findings. She thought this pain went away with her stent but now feels it with walking. Worse with some movements. Ed began with pt with good understanding. Will refer to [place]. Understands importance of Brilinta. [place], ACSM 06/16/2017 1:20 PM

## 2017-06-16 NOTE — Progress Notes (Signed)
Progress Note  Patient Name: Jocelyn [name] Date of Encounter: 06/16/2017  Primary Cardiologist: Irish Lack  Subjective   Minimal tightness in her chest. Nothing compared to what brought her into the hospital.  Inpatient Medications    Scheduled Meds: . aspirin  81 mg Oral Daily  . corticotropin  80 Units Subcutaneous Once per day on Mon Wed Fri  . diltiazem  240 mg Oral Daily  . estrogen (conjugated)-medroxyprogesterone  1 tablet Oral Daily  . fluticasone  2 spray Each Nare QHS  . insulin aspart  0-15 Units Subcutaneous TID WC  . metoprolol tartrate  25 mg Oral BID  . mometasone-formoterol  2 puff Inhalation BID  . pantoprazole  40 mg Oral Daily  . sodium chloride flush  3 mL Intravenous Q12H  . ticagrelor  90 mg Oral BID  . valACYclovir  500 mg Oral Daily   Continuous Infusions: . sodium chloride 250 mL (06/15/17 1256)  . sodium chloride     PRN Meds: sodium chloride, acetaminophen, albuterol, ALPRAZolam, nitroGLYCERIN, ondansetron (ZOFRAN) IV, ondansetron, sodium chloride flush   Vital Signs    Vitals:   06/16/17 0600 06/16/17 0700 06/16/17 0716 06/16/17 0833  BP:      Pulse:    74  Resp: (!) 24 (!) 27  (!) 24  Temp:   98 F (36.7 C)   TempSrc:   Oral   SpO2:    96%  Weight:      Height:        Intake/Output Summary (Last 24 hours) at 06/16/17 0952 Last data filed at 06/15/17 1800  Gross per 24 hour  Intake              640 ml  Output              750 ml  Net             -110 ml   Filed Weights   06/15/17 1151  Weight: 180 lb (81.6 kg)    Telemetry    Normal sinus rhythm, rare PVCs - Personally Reviewed  ECG     Normal sinus rhythm, inferior Q waves with mild residual ST elevation inferiorly- Personally Reviewed  Physical Exam   GEN: No acute distress.   Neck: No JVD Cardiac: RRR, no murmurs, rubs, or gallops.  Respiratory: Clear to auscultation bilaterally. GI: Soft, nontender, non-distended  MS: No edema; No deformity. Right wrist  bruised. 2+ pulse Neuro:  Nonfocal  Psych: Normal affect   Labs    Chemistry Recent Labs Lab 06/15/17 1205 06/15/17 1251 06/16/17 0150  NA 141 138 138  K 3.9 3.2* 3.3*  CL 108 103 106  CO2 19*  --  23  GLUCOSE 173* 148* 129*  BUN 13 13 7   CREATININE 0.84 0.50 0.71  CALCIUM 9.2  --  8.3*  GFRNONAA >60  --  >60  GFRAA >60  --  >60  ANIONGAP 14  --  9     Hematology Recent Labs Lab 06/15/17 1205 06/15/17 1251 06/16/17 0150  WBC 8.2  --  9.5  RBC 4.35  --  3.88  HGB 13.4 10.2* 11.6*  HCT 39.0 30.0* 34.8*  MCV 89.7  --  89.7  MCH 30.8  --  29.9  MCHC 34.4  --  33.3  RDW 13.6  --  13.8  PLT 245  --  235    Cardiac Enzymes Recent Labs Lab 06/15/17 1205 06/16/17 0150  TROPONINI <0.03 36.83*  Recent Labs Lab 06/15/17 1210  TROPIPOC 0.01     BNPNo results for input(s): BNP, PROBNP in the last 168 hours.   DDimer No results for input(s): DDIMER in the last 168 hours.   Radiology    No results found.  Cardiac Studies   Cardiac cath showing occluded RCA and chronic disease in a diagonal  Patient Profile     61 y.o. female s/p RCA PCI  Assessment & Plan    1) CAD: Doing well status post inferior MI. Continue dual antiplatelet therapy along with aggressive secondary prevention. Metoprolol added. Continue aspirin and Brilinta. She is on Repatha for lipid lowering therapy. Will have her walk with cardiac rehabilitation.  2) diabetes: Continue sliding scale insulin. Resume home meds tomorrow. She will need to try for aggressive diabetes control.  3) hypertension: Add back Cozaar at low dose today. Difficult stick. We'll try to minimize blood draws.  Plan to move out to the telemetry floor today. Possible discharge tomorrow depending on how she's feeling.  Signed, Larae Grooms, MD  06/16/2017, 9:52 AM

## 2017-06-16 NOTE — Progress Notes (Signed)
  Echocardiogram 2D Echocardiogram has been performed.  Jocelyn [name] 06/16/2017, 1:32 PM

## 2017-06-17 ENCOUNTER — Telehealth: Payer: Self-pay | Admitting: Interventional Cardiology

## 2017-06-17 LAB — GLUCOSE, CAPILLARY
GLUCOSE-CAPILLARY: 119 mg/dL — AB (ref 65–99)
GLUCOSE-CAPILLARY: 129 mg/dL — AB (ref 65–99)
GLUCOSE-CAPILLARY: 127 mg/dL — AB (ref 65–99)
GLUCOSE-CAPILLARY: 146 mg/dL — AB (ref 65–99)

## 2017-06-17 MED ORDER — METOPROLOL TARTRATE 25 MG PO TABS
25.0000 mg | ORAL_TABLET | Freq: Once | ORAL | Status: AC
  Administered 2017-06-17: 25 mg via ORAL
  Filled 2017-06-17: qty 1

## 2017-06-17 MED ORDER — ACETAMINOPHEN 325 MG PO TABS
650.0000 mg | ORAL_TABLET | ORAL | Status: DC | PRN
  Filled 2017-06-17: qty 2

## 2017-06-17 MED ORDER — ONDANSETRON HCL 4 MG/2ML IJ SOLN
4.0000 mg | Freq: Four times a day (QID) | INTRAMUSCULAR | Status: DC | PRN
  Filled 2017-06-17: qty 2

## 2017-06-17 MED ORDER — METOPROLOL TARTRATE 50 MG PO TABS
50.0000 mg | ORAL_TABLET | Freq: Two times a day (BID) | ORAL | Status: DC
  Administered 2017-06-17 – 2017-06-18 (×2): 50 mg via ORAL
  Filled 2017-06-17 (×2): qty 1

## 2017-06-17 NOTE — Plan of Care (Signed)
Problem: Physical Regulation: Goal: Ability to maintain clinical measurements within normal limits will improve Outcome: Progressing VSS remain stable throughout evening. Patient has had no complaints of chest discomfort this evening. Bruising to radial site, Level 1, soft to touch.

## 2017-06-17 NOTE — Progress Notes (Signed)
Progress Note  Patient Name: Jocelyn [name] Date of Encounter: 06/17/2017  Primary Cardiologist: Irish Lack  Subjective   No significant chest pain.  Did well walking with cardiac rehab.  Inpatient Medications    Scheduled Meds: . aspirin  81 mg Oral Daily  . diltiazem  240 mg Oral Daily  . fluticasone  2 spray Each Nare QHS  . insulin aspart  0-15 Units Subcutaneous TID WC  . losartan  25 mg Oral Daily  . metoprolol tartrate  50 mg Oral BID  . mometasone-formoterol  2 puff Inhalation BID  . pantoprazole  40 mg Oral Daily  . sodium chloride flush  3 mL Intravenous Q12H  . ticagrelor  90 mg Oral BID  . valACYclovir  500 mg Oral Daily   Continuous Infusions: . sodium chloride 250 mL (06/15/17 1256)  . sodium chloride     PRN Meds: sodium chloride, acetaminophen, albuterol, ALPRAZolam, nitroGLYCERIN, ondansetron (ZOFRAN) IV, ondansetron, sodium chloride flush   Vital Signs    Vitals:   06/16/17 2055 06/17/17 0400 06/17/17 0839 06/17/17 1257  BP: 114/80 111/65 (!) 132/96 116/83  Pulse: 78 82    Resp: 16 16    Temp: 98.2 F (36.8 C) 98.2 F (36.8 C)    TempSrc: Oral Oral    SpO2: 97% 99%    Weight:  183 lb 6.4 oz (83.2 kg)    Height:        Intake/Output Summary (Last 24 hours) at 06/17/17 1328 Last data filed at 06/17/17 0900  Gross per 24 hour  Intake              640 ml  Output                0 ml  Net              640 ml   Filed Weights   06/15/17 1151 06/17/17 0400  Weight: 180 lb (81.6 kg) 183 lb 6.4 oz (83.2 kg)    Telemetry    NSR - Personally Reviewed  ECG    NSR, inferior Q waves, inferior T wave changes, ST changes improved  Physical Exam   GEN: No acute distress.   Neck: No JVD Cardiac: RRR, no murmurs, rubs, or gallops.  Respiratory: Clear to auscultation bilaterally. GI: Soft, nontender, non-distended  MS: No edema; No deformity. Right wrist bruised with palpable pulse Neuro:  Nonfocal  Psych: Normal affect   Labs     Chemistry Recent Labs Lab 06/15/17 1205 06/15/17 1251 06/16/17 0150  NA 141 138 138  K 3.9 3.2* 3.3*  CL 108 103 106  CO2 19*  --  23  GLUCOSE 173* 148* 129*  BUN 13 13 7   CREATININE 0.84 0.50 0.71  CALCIUM 9.2  --  8.3*  GFRNONAA >60  --  >60  GFRAA >60  --  >60  ANIONGAP 14  --  9     Hematology Recent Labs Lab 06/15/17 1205 06/15/17 1251 06/16/17 0150  WBC 8.2  --  9.5  RBC 4.35  --  3.88  HGB 13.4 10.2* 11.6*  HCT 39.0 30.0* 34.8*  MCV 89.7  --  89.7  MCH 30.8  --  29.9  MCHC 34.4  --  33.3  RDW 13.6  --  13.8  PLT 245  --  235    Cardiac Enzymes Recent Labs Lab 06/15/17 1205 06/16/17 0150  TROPONINI <0.03 36.83*    Recent Labs Lab 06/15/17 1210  TROPIPOC  0.01     BNPNo results for input(s): BNP, PROBNP in the last 168 hours.   DDimer No results for input(s): DDIMER in the last 168 hours.   Radiology    No results found.  Cardiac Studies   Cath with DES to the RCA.  Patient Profile     61 y.o. female with inferior STEMI  Assessment & Plan    CAD/MI: increase beta blocker to 50 mg twice daily. Repatha for lipid lowering therapy.  She has been on this for some time. She is considering the [place] study.   Encourage cardiac rehabilitation. Stressed the importance of dual antiplatelet therapy. She needs aggressive diabetes control.  Consider discharge later today depending on how she is feeling otherwise, she should be ready for discharge tomorrow.  Signed, Larae Grooms, MD  06/17/2017, 1:28 PM

## 2017-06-17 NOTE — Discharge Summary (Signed)
Discharge Summary    Patient ID: Jocelyn [name],  MRN: [id number], DOB/AGE: 11/17/1955 61 y.o.  Admit date: 06/15/2017 Discharge date: 06/18/2017  Primary Care Provider: Noralee Space Primary Cardiologist: Dr. Irish Lack  Discharge Diagnoses    Active Problems:   Acute ST elevation myocardial infarction (STEMI) involving right coronary artery [facility])   Acute MI, inferior wall, initial episode of care [facility])   Allergies Allergies  Allergen Reactions  . Actos [Pioglitazone] Other (See Comments)    REACTION: pt states INTOL w/ edema  . Codeine Other (See Comments)    REACTION: vomiting  . Januvia [Sitagliptin] Nausea Only  . Lactose Intolerance (Gi) Diarrhea  . Morphine Nausea Only    REACTION: vomiting    Diagnostic Studies/Procedures    CORONARY STENT INTERVENTION   06/15/17  LEFT HEART CATH AND CORONARY ANGIOGRAPHY  Conclusion     Mid LAD lesion, 40 %stenosed.  Ost 2nd Diag to 2nd Diag lesion, 80 %stenosed. This appears to be old based on prior cath report.  Dist RCA-1 lesion, 20 %stenosed.  Mid RCA lesion, 10 %stenosed.  Dist RCA-2 lesion, 100 %stenosed. This is the culprit lesion and was treated with aspiration thrombectomy and stent placement.  A STENT PROMUS PREM MR [id number] drug eluting stent was successfully placed, postdilated to 3.8 mm.  Post intervention, there is a 0% residual stenosis.  LV end diastolic pressure is normal.  There is no aortic valve stenosis.  Severe radial spasm and subclavian tortuosity. Would consider femoral approach if angiogram was needed in the future.   Continue DAPT for at least a year along with secondary prevention.    Statin intolerant.  Taking Repatha.    Will need DM control.    Watch in ICU.  Continue tirofiban for 2 hours.  Will start beta blocker as BP tolerates.  Holding ARB.   Cath 06/16/17 Study Conclusions  - Left ventricle: The cavity size was normal. There was mild   concentric hypertrophy. Systolic  function was normal. The   estimated ejection fraction was in the range of 50% to 55%. Wall   motion was normal; there were no regional wall motion   abnormalities. Doppler parameters are consistent with abnormal   left ventricular relaxation (grade 1 diastolic dysfunction). - Aortic valve: There was no regurgitation. - Mitral valve: There was mild regurgitation. - Right ventricle: The cavity size was normal. Wall thickness was   normal. Systolic function was normal. - Atrial septum: No defect or patent foramen ovale was identified   by color flow Doppler. - Tricuspid valve: There was no regurgitation.   History of Present Illness     61 yo female with PMH of nonobstructive CAD, HTN, NIDDM, HL and obesity who presented 06/15/17 with chest pain and Code STEMI activated.   Previous cath in 2015 with possible disease in branch vessel. Last seen in the office on 1/18 and reported being at her baseline. Hx of statin intolerance and was on Repatha.  Reports she developed chest pain PM of 7/31 that worsened throughout the evening. States the pain woke her up  next morning around 4am, and then worsened again at 9am. Presented to the [facility] with symptoms. EKG showed acute ST elevation in the inferior leads. She was given 358m asa, and 4000 units of heparin and brought to the cath lab for emergent cath  Hospital Course     Consultants: None  1. STEMI - Cath showed 100% dRCA s/p aspiration thrombectomy & DES.  80% ost 2nd dig lesion (old compared to prior cath). Peak of troponin 36.83. Continue DAPT for at least a year along with secondary prevention. Echo showed normal EF. Continue BB and ARB. ASA + Brilinta. Rx for PRN SL NTG ordered. Continue Repatha for HLD.   2. HLD - Statin intolerance. She is on Repatha.  3. DM - Treated with SSI while inpatient. Resumed home meds.   4. HTN - Resumed home Cozaar, Cardizem and metoprolol    The patient has been seen by Dr. Irish Lack today and deemed  ready for discharge home. All follow-up appointments have been scheduled. Discharge medications are listed below.  _____________   Discharge Vitals Blood pressure 109/75, pulse 88, temperature 98.2 F (36.8 C), resp. rate 18, height _0  (1.575 m), weight 180 lb 14.4 oz (82.1 kg), SpO2 99 %.  Filed Weights   06/15/17 1151 06/17/17 0400 06/18/17 0521  Weight: 180 lb (81.6 kg) 183 lb 6.4 oz (83.2 kg) 180 lb 14.4 oz (82.1 kg)    Labs & Radiologic Studies     CBC  Recent Labs  06/15/17 1205 06/15/17 1251 06/16/17 0150  WBC 8.2  --  9.5  NEUTROABS 4.8  --   --   HGB 13.4 10.2* 11.6*  HCT 39.0 30.0* 34.8*  MCV 89.7  --  89.7  PLT 245  --  606   Basic Metabolic Panel  Recent Labs  06/15/17 1205 06/15/17 1251 06/16/17 0150  NA 141 138 138  K 3.9 3.2* 3.3*  CL 108 103 106  CO2 19*  --  23  GLUCOSE 173* 148* 129*  BUN _1 CREATININE 0.84 0.50 0.71  CALCIUM 9.2  --  8.3*   Liver Function Tests No results for input(s): AST, ALT, ALKPHOS, BILITOT, PROT, ALBUMIN in the last 72 hours. No results for input(s): LIPASE, AMYLASE in the last 72 hours. Cardiac Enzymes  Recent Labs  06/15/17 1205 06/16/17 0150  TROPONINI <0.03 36.83*   BNP Invalid input(s): POCBNP D-Dimer No results for input(s): DDIMER in the last 72 hours. Hemoglobin A1C No results for input(s): HGBA1C in the last 72 hours. Fasting Lipid Panel  Recent Labs  06/15/17 1205  CHOL 182  HDL 56  LDLCALC 93  TRIG 163*  CHOLHDL 3.3   Thyroid Function Tests No results for input(s): TSH, T4TOTAL, T3FREE, THYROIDAB in the last 72 hours.  Invalid input(s): FREET3  No results found.  Disposition   Pt is being discharged home today in good condition.  Follow-up Plans & Appointments    Follow-up Information    Imogene Burn, PA-C. Go on 06/27/2017.   Specialty:  Cardiology Why:  _2 :15 pm for TCM Contact information: [address] [place]  [address] [phone]          Discharge Instructions    Amb Referral to Cardiac Rehabilitation    Complete by:  As directed    Diagnosis:   Coronary Stents STEMI PTCA     Diet - low sodium heart healthy    Complete by:  As directed    Increase activity slowly    Complete by:  As directed       Discharge Medications   Current Discharge Medication List    START taking these medications   Details  aspirin 81 MG chewable tablet Chew 1 tablet (81 mg total) by mouth daily.    !! ticagrelor (BRILINTA) 90 MG TABS tablet Take 1 tablet (90 mg total) by  mouth 2 (two) times daily. Qty: 180 tablet, Refills: 3    !! ticagrelor (BRILINTA) 90 MG TABS tablet Take 1 tablet (90 mg total) by mouth 2 (two) times daily. Qty: 60 tablet, Refills: 0     !! - Potential duplicate medications found. Please discuss with provider.    CONTINUE these medications which have CHANGED   Details  Linagliptin-Metformin HCl (JENTADUETO) 2.03-999 MG TABS Take 2 tablets by mouth daily. Qty: 60 tablet    losartan (COZAAR) 25 MG tablet Take 1 tablet (25 mg total) by mouth daily. Qty: 30 tablet, Refills: 6    metoprolol tartrate (LOPRESSOR) 50 MG tablet Take 1 tablet (50 mg total) by mouth 2 (two) times daily. Qty: 60 tablet, Refills: 5    nitroGLYCERIN (NITROSTAT) 0.4 MG SL tablet Place 1 tablet (0.4 mg total) under the tongue every 5 (five) minutes as needed for chest pain. Qty: 25 tablet, Refills: 2      CONTINUE these medications which have NOT CHANGED   Details  albuterol (PROVENTIL HFA;VENTOLIN HFA) 108 (90 BASE) MCG/ACT inhaler Inhale 1-2 puffs into the lungs every 6 (six) hours as needed for wheezing or shortness of breath. Qty: 8 g, Refills: 2    ALPRAZolam (XANAX) 0.5 MG tablet Take 0.5 mg by mouth at bedtime as needed for anxiety.    budesonide-formoterol (SYMBICORT) 80-4.5 MCG/ACT inhaler Inhale 2 puffs into the lungs 2 (two) times daily. Qty: 1 Inhaler, Refills: 12   Associated  Diagnoses: Asthmatic bronchitis , chronic (HCC)    CARTIA XT 240 MG 24 hr capsule take 1 capsule by mouth once daily Qty: 90 capsule, Refills: 3    Cyanocobalamin 1000 MCG/ML KIT Inject 1,000 mg as directed every 30 (thirty) days.    Evolocumab (REPATHA SURECLICK) 381 MG/ML SOAJ Inject 1 pen into the skin every 14 (fourteen) days. Qty: 2 pen, Refills: 11    fluticasone (FLONASE) 50 MCG/ACT nasal spray Place 2 sprays into both nostrils at bedtime. Qty: 16 g, Refills: 2    furosemide (LASIX) 20 MG tablet Take 1 tablet (20 mg total) by mouth daily. Qty: 90 tablet, Refills: 3    HP ACTHAR 80 UNIT/ML injectable gel Inject 80 Units into the skin 3 (three) times a week.     ondansetron (ZOFRAN-ODT) 4 MG disintegrating tablet Take 4 mg by mouth every 8 (eight) hours as needed for nausea or vomiting.    pantoprazole (PROTONIX) 40 MG tablet Take 1 tablet (40 mg total) by mouth daily. Qty: 30 tablet, Refills: 11    PREMPRO 0.3-1.5 MG tablet Take 1 tablet by mouth daily. Refills: 0    valACYclovir (VALTREX) 500 MG tablet Take 500 mg by mouth daily.          Aspirin prescribed at discharge?  Yes High Intensity Statin Prescribed? (Lipitor 40-67m or Crestor 20-435m: on Rapatha due to statin intolerance Beta Blocker Prescribed? Yes For EF 45% or less, Was ACEI/ARB Prescribed? Yes ADP Receptor Inhibitor Prescribed? (i.e. Plavix etc.-Includes Medically Managed Patients): Yes For EF <40%, Aldosterone Inhibitor Prescribed? N/A Was EF assessed during THIS hospitalization? Yes Was Cardiac Rehab II ordered? (Included Medically managed Patients): Yes   Outstanding Labs/Studies   None  Duration of Discharge Encounter   Greater than 30 minutes including physician time.  Signed, BrLyda JesterA-C   06/18/2017, 8:26 AM   I have examined the patient and reviewed assessment and plan and discussed with patient.  Agree with above as stated.  COntinue DAPT, beta blocker and ARB.  Statin  intolerant, but on Repatha.  Feels well today.  Ready for discharge.  Please see my note from today.    Larae Grooms

## 2017-06-17 NOTE — Progress Notes (Signed)
CARDIAC REHAB PHASE I   PRE:  Rate/Rhythm: 84 SR  BP:  Sitting: 127/88        SaO2: 96 RA  MODE:  Ambulation: 550 ft   POST:  Rate/Rhythm: 106 ST  BP:  Sitting: 132/96         SaO2: 99 RA  Pt ambulated 550 ft on RA, independent, assist x1, slow, steady gait, tolerated well with no complaints. Completed MI/stent education. Reviewed/reinforced anti-platelet therapy, stent card, activity restrictions, discussed ntg, exercise, heart healthy and diabetes diet handouts and phase 2 cardiac rehab. Pt verbalized understanding. Phase 2 cardiac rehab referral, sent to [facility]. Pt to recliner after walk, call bell within reach. Will follow if pt does not discharge today.      [phone] Lenna Sciara, RN, BSN 06/17/2017 9:05 AM

## 2017-06-17 NOTE — Telephone Encounter (Signed)
New message      TCM appt made on 06-27-17 at 12:15 with Estella Husk per Cephus Shelling

## 2017-06-17 NOTE — Telephone Encounter (Signed)
Per review of Pt chart, Pt inpatient at this time.  Pt to discharge 06/17/2017.  Will continue to follow for TCM needs.

## 2017-06-18 DIAGNOSIS — I2119 ST elevation (STEMI) myocardial infarction involving other coronary artery of inferior wall: Secondary | ICD-10-CM

## 2017-06-18 DIAGNOSIS — E119 Type 2 diabetes mellitus without complications: Secondary | ICD-10-CM

## 2017-06-18 DIAGNOSIS — Z9889 Other specified postprocedural states: Secondary | ICD-10-CM

## 2017-06-18 DIAGNOSIS — Z955 Presence of coronary angioplasty implant and graft: Secondary | ICD-10-CM

## 2017-06-18 LAB — GLUCOSE, CAPILLARY
GLUCOSE-CAPILLARY: 136 mg/dL — AB (ref 65–99)
Glucose-Capillary: 115 mg/dL — ABNORMAL HIGH (ref 65–99)

## 2017-06-18 MED ORDER — NITROGLYCERIN 0.4 MG SL SUBL: 0.4000 mg | SUBLINGUAL_TABLET | SUBLINGUAL | 2 refills | Status: DC | PRN

## 2017-06-18 MED ORDER — TICAGRELOR 90 MG PO TABS: 90.0000 mg | ORAL_TABLET | Freq: Two times a day (BID) | ORAL | 3 refills | Status: DC

## 2017-06-18 MED ORDER — LOSARTAN POTASSIUM 25 MG PO TABS: 25.0000 mg | ORAL_TABLET | Freq: Every day | ORAL | 6 refills | Status: DC

## 2017-06-18 MED ORDER — METOPROLOL TARTRATE 50 MG PO TABS: 50.0000 mg | ORAL_TABLET | Freq: Two times a day (BID) | ORAL | 5 refills | Status: DC

## 2017-06-18 MED ORDER — ASPIRIN 81 MG PO CHEW: 81.0000 mg | CHEWABLE_TABLET | Freq: Every day | ORAL | Status: DC

## 2017-06-18 MED ORDER — TICAGRELOR 90 MG PO TABS: 90.0000 mg | ORAL_TABLET | Freq: Two times a day (BID) | ORAL | 0 refills | Status: DC

## 2017-06-18 MED ORDER — LINAGLIPTIN-METFORMIN HCL 2.5-1000 MG PO TABS: 2.0000 | ORAL_TABLET | Freq: Every day | ORAL | Status: DC

## 2017-06-18 NOTE — Progress Notes (Signed)
Discharge instructions reviewed with pt. Pt has no questions at this time. IV d/c. Pt stated she is ready to go. Waiting on daughter to pick her up.

## 2017-06-18 NOTE — Progress Notes (Signed)
Progress Note  Patient Name: Jocelyn [name] Date of Encounter: 06/18/2017  Primary Cardiologist: Irish Lack  Subjective   No chest pain.  Nausea is improved.  Inpatient Medications    Scheduled Meds: . aspirin  81 mg Oral Daily  . diltiazem  240 mg Oral Daily  . fluticasone  2 spray Each Nare QHS  . insulin aspart  0-15 Units Subcutaneous TID WC  . losartan  25 mg Oral Daily  . metoprolol tartrate  50 mg Oral BID  . mometasone-formoterol  2 puff Inhalation BID  . pantoprazole  40 mg Oral Daily  . sodium chloride flush  3 mL Intravenous Q12H  . ticagrelor  90 mg Oral BID  . valACYclovir  500 mg Oral Daily   Continuous Infusions: . sodium chloride 250 mL (06/15/17 1256)  . sodium chloride     PRN Meds: sodium chloride, acetaminophen, acetaminophen, albuterol, ALPRAZolam, nitroGLYCERIN, ondansetron (ZOFRAN) IV, ondansetron (ZOFRAN) IV, ondansetron, sodium chloride flush   Vital Signs    Vitals:   06/17/17 1257 06/17/17 1334 06/17/17 2047 06/18/17 0521  BP: 116/83 109/75 (!) 127/91 109/75  Pulse:  91 94 88  Resp:   18 18  Temp:  98 F (36.7 C) 98.2 F (36.8 C) 98.2 F (36.8 C)  TempSrc:  Oral    SpO2:  99% 100% 99%  Weight:    180 lb 14.4 oz (82.1 kg)  Height:        Intake/Output Summary (Last 24 hours) at 06/18/17 0735 Last data filed at 06/18/17 0522  Gross per 24 hour  Intake              580 ml  Output                0 ml  Net              580 ml   Filed Weights   06/15/17 1151 06/17/17 0400 06/18/17 0521  Weight: 180 lb (81.6 kg) 183 lb 6.4 oz (83.2 kg) 180 lb 14.4 oz (82.1 kg)    Telemetry    NSR - Personally Reviewed  ECG    NSR, inferior ST changes improved. - Personally Reviewed  Physical Exam   GEN: No acute distress.   Neck: No JVD Cardiac: RRR, no murmurs, rubs, or gallops.  Respiratory: Clear to auscultation bilaterally. GI: Soft, nontender, non-distended  MS: No edema; No deformity. Bruising at right wrist and left  forearm. Neuro:  Nonfocal  Psych: Normal affect   Labs    Chemistry Recent Labs Lab 06/15/17 1205 06/15/17 1251 06/16/17 0150  NA 141 138 138  K 3.9 3.2* 3.3*  CL 108 103 106  CO2 19*  --  23  GLUCOSE 173* 148* 129*  BUN 13 13 7   CREATININE 0.84 0.50 0.71  CALCIUM 9.2  --  8.3*  GFRNONAA >60  --  >60  GFRAA >60  --  >60  ANIONGAP 14  --  9     Hematology Recent Labs Lab 06/15/17 1205 06/15/17 1251 06/16/17 0150  WBC 8.2  --  9.5  RBC 4.35  --  3.88  HGB 13.4 10.2* 11.6*  HCT 39.0 30.0* 34.8*  MCV 89.7  --  89.7  MCH 30.8  --  29.9  MCHC 34.4  --  33.3  RDW 13.6  --  13.8  PLT 245  --  235    Cardiac Enzymes Recent Labs Lab 06/15/17 1205 06/16/17 0150  TROPONINI <0.03 36.83*  Recent Labs Lab 06/15/17 1210  TROPIPOC 0.01     BNPNo results for input(s): BNP, PROBNP in the last 168 hours.   DDimer No results for input(s): DDIMER in the last 168 hours.   Radiology    No results found.  Cardiac Studies   Cath report reviewed  Patient Profile     61 y.o. female s/p inferior STEMI  Assessment & Plan    1) CAD / MI: COntinue metoprolol 50 mg BID, DAPT with Brilinta, Repatha for lipids.  Slight increase in HR around 11:30 PM correlated to when she was walking.  Cardiac rehab.  Rx for SL NTG.  Discharge today.  Signed, Larae Grooms, MD  06/18/2017, 7:35 AM

## 2017-06-20 NOTE — Telephone Encounter (Signed)
Patient contacted regarding discharge from [facility] on 06/18/2017.  Patient understands to follow up with provider Jocelyn [name] on 06/27/2017 at 1215 at [facility] office. Patient understands discharge instructions? yes Patient understands medications and regiment? yes Patient understands to bring all medications to this visit? Yes-notified Pt to bring her AVS with her to appt.  Pt states she has been doing ok.  Last night she developed a rash on her lower leg, area is warm.  States it feels like a couple knots on her inside lower leg.  Notified Pt to continue to monitor area-if she develops pain, increased swelling, area becomes red and hot or she develops a fever she should call office to be seen.  Pt indicates understanding.  Pt states she has been tolerating Brilinta well.  Some wheezing but that is not outside her norm.  Pt also states she has been having back pain, which has been ongoing for awhile.    Pt states she will monitor leg and call office if needed.  This nurse name left for any further questions/issues.

## 2017-06-22 ENCOUNTER — Telehealth (HOSPITAL_COMMUNITY): Payer: Self-pay

## 2017-06-22 NOTE — Telephone Encounter (Signed)
Patient insurance is active and benefits verified. Patient insurance is [employer] - no co-payment, deductible 41250/$1250 has been met, out of pocket $4350/$624.96 has been met, 20% co-insurance, no pre-authorization and no limit on visit. Passport/reference [phone].  Patient will be contacted and scheduled after their follow up appointment with the cardiologist on 06/27/17, upon review by [facility] RN navigator.

## 2017-06-27 ENCOUNTER — Encounter: Payer: Self-pay | Admitting: Physician Assistant

## 2017-06-27 ENCOUNTER — Encounter (INDEPENDENT_AMBULATORY_CARE_PROVIDER_SITE_OTHER): Payer: Self-pay

## 2017-06-27 ENCOUNTER — Ambulatory Visit (INDEPENDENT_AMBULATORY_CARE_PROVIDER_SITE_OTHER): Payer: BC Managed Care – PPO | Admitting: Physician Assistant

## 2017-06-27 VITALS — BP 142/98 | HR 90 | Ht 62.0 in | Wt 171.4 lb

## 2017-06-27 DIAGNOSIS — E782 Mixed hyperlipidemia: Secondary | ICD-10-CM | POA: Diagnosis not present

## 2017-06-27 DIAGNOSIS — I10 Essential (primary) hypertension: Secondary | ICD-10-CM

## 2017-06-27 DIAGNOSIS — I2510 Atherosclerotic heart disease of native coronary artery without angina pectoris: Secondary | ICD-10-CM | POA: Diagnosis not present

## 2017-06-27 DIAGNOSIS — E119 Type 2 diabetes mellitus without complications: Secondary | ICD-10-CM

## 2017-06-27 NOTE — Patient Instructions (Signed)
Medication Instructions:  Your physician recommends that you continue on your current medications as directed. Please refer to the Current Medication list given to you today.   Labwork: None Ordered   Testing/Procedures: None Ordered   Follow-Up: Your physician recommends that you schedule a follow-up appointment in: keep appointment with Dr. Irish Lack.  Any Other Special Instructions Will Be Listed Below (If Applicable).   Heart-Healthy Eating Plan Heart-healthy meal planning includes:  Limiting unhealthy fats.  Increasing healthy fats.  Making other small dietary changes.  You may need to talk with your doctor or a diet specialist (dietitian) to create an eating plan that is right for you. What types of fat should I choose?  Choose healthy fats. These include olive oil and canola oil, flaxseeds, walnuts, almonds, and seeds.  Eat more omega-3 fats. These include salmon, mackerel, sardines, tuna, flaxseed oil, and ground flaxseeds. Try to eat fish at least twice each week.  Limit saturated fats. ? Saturated fats are often found in animal products, such as meats, butter, and cream. ? Plant sources of saturated fats include palm oil, palm kernel oil, and coconut oil.  Avoid foods with partially hydrogenated oils in them. These include stick margarine, some tub margarines, cookies, crackers, and other baked goods. These contain trans fats. What general guidelines do I need to follow?  Check food labels carefully. Identify foods with trans fats or high amounts of saturated fat.  Fill one half of your plate with vegetables and green salads. Eat 4-5 servings of vegetables per day. A serving of vegetables is: ? 1 cup of raw leafy vegetables. ?  cup of raw or cooked cut-up vegetables. ?  cup of vegetable juice.  Fill one fourth of your plate with whole grains. Look for the word "whole" as the first word in the ingredient list.  Fill one fourth of your plate with lean  protein foods.  Eat 4-5 servings of fruit per day. A serving of fruit is: ? One medium whole fruit. ?  cup of dried fruit. ?  cup of fresh, frozen, or canned fruit. ?  cup of 100% fruit juice.  Eat more foods that contain soluble fiber. These include apples, broccoli, carrots, beans, peas, and barley. Try to get 20-30 g of fiber per day.  Eat more home-cooked food. Eat less restaurant, buffet, and fast food.  Limit or avoid alcohol.  Limit foods high in starch and sugar.  Avoid fried foods.  Avoid frying your food. Try baking, boiling, grilling, or broiling it instead. You can also reduce fat by: ? Removing the skin from poultry. ? Removing all visible fats from meats. ? Skimming the fat off of stews, soups, and gravies before serving them. ? Steaming vegetables in water or broth.  Lose weight if you are overweight.  Eat 4-5 servings of nuts, legumes, and seeds per week: ? One serving of dried beans or legumes equals  cup after being cooked. ? One serving of nuts equals 1 ounces. ? One serving of seeds equals  ounce or one tablespoon.  You may need to keep track of how much salt or sodium you eat. This is especially true if you have high blood pressure. Talk with your doctor or dietitian to get more information. What foods can I eat? Grains Breads, including [place], white, pita, wheat, raisin, rye, oatmeal, and [place]. Tortillas that are neither fried nor made with lard or trans fat. Low-fat rolls, including hotdog and hamburger buns and English muffins. Biscuits. Muffins. Waffles.  Pancakes. Light popcorn. Whole-grain cereals. Flatbread. Melba toast. Pretzels. Breadsticks. Rusks. Low-fat snacks. Low-fat crackers, including oyster, saltine, matzo, graham, animal, and rye. Rice and pasta, including brown rice and pastas that are made with whole wheat. Vegetables All vegetables. Fruits All fruits, but limit coconut. Meats and Other Protein Sources Lean, well-trimmed beef,  veal, pork, and lamb. Chicken and [place] without skin. All fish and shellfish. Wild duck, rabbit, pheasant, and venison. Egg whites or low-cholesterol egg substitutes. Dried beans, peas, lentils, and tofu. Seeds and most nuts. Dairy Low-fat or nonfat cheeses, including ricotta, string, and mozzarella. Skim or 1% milk that is liquid, powdered, or evaporated. Buttermilk that is made with low-fat milk. Nonfat or low-fat yogurt. Beverages Mineral water. Diet carbonated beverages. Sweets and Desserts Sherbets and fruit ices. Honey, jam, marmalade, jelly, and syrups. Meringues and gelatins. Pure sugar candy, such as hard candy, jelly beans, gumdrops, mints, marshmallows, and small amounts of dark chocolate. [employer] Eat all sweets and desserts in moderation. Fats and Oils Nonhydrogenated (trans-free) margarines. Vegetable oils, including soybean, sesame, sunflower, olive, peanut, safflower, corn, canola, and cottonseed. Salad dressings or mayonnaise made with a vegetable oil. Limit added fats and oils that you use for cooking, baking, salads, and as spreads. Other Cocoa powder. Coffee and tea. All seasonings and condiments. The items listed above may not be a complete list of recommended foods or beverages. Contact your dietitian for more options. What foods are not recommended? Grains Breads that are made with saturated or trans fats, oils, or whole milk. Croissants. Butter rolls. Cheese breads. Sweet rolls. Donuts. Buttered popcorn. Chow mein noodles. High-fat crackers, such as cheese or butter crackers. Meats and Other Protein Sources Fatty meats, such as hotdogs, short ribs, sausage, spareribs, bacon, rib eye roast or steak, and mutton. High-fat deli meats, such as salami and bologna. Caviar. Domestic duck and goose. Organ meats, such as kidney, liver, sweetbreads, and heart. Dairy Cream, sour cream, cream cheese, and creamed cottage cheese. Whole-milk cheeses, including blue (bleu),  Monte[place] Jack, Browns, [place], American, [place], Swiss, cheddar, Start, and [place]. Whole or 2% milk that is liquid, evaporated, or condensed. Whole buttermilk. Cream sauce or high-fat cheese sauce. Yogurt that is made from whole milk. Beverages Regular sodas and juice drinks with added sugar. Sweets and Desserts Frosting. Pudding. Cookies. Cakes other than angel food cake. Candy that has milk chocolate or white chocolate, hydrogenated fat, butter, coconut, or unknown ingredients. Buttered syrups. Full-fat ice cream or ice cream drinks. Fats and Oils Gravy that has suet, meat fat, or shortening. Cocoa butter, hydrogenated oils, palm oil, coconut oil, palm kernel oil. These can often be found in baked products, candy, fried foods, nondairy creamers, and whipped toppings. Solid fats and shortenings, including bacon fat, salt pork, lard, and butter. Nondairy cream substitutes, such as coffee creamers and sour cream substitutes. Salad dressings that are made of unknown oils, cheese, or sour cream. The items listed above may not be a complete list of foods and beverages to avoid. Contact your dietitian for more information. This information is not intended to replace advice given to you by your health care provider. Make sure you discuss any questions you have with your health care provider. Document Released: 05/02/2012 Document Revised: 04/08/2016 Document Reviewed: 04/25/2014 Elsevier Interactive Patient Education  [employer].    If you need a refill on your cardiac medications before your next appointment, please call your pharmacy.

## 2017-06-27 NOTE — Progress Notes (Signed)
Cardiology Office Note    Date:  06/27/2017   ID:  Jocelyn [name], DOB 1955-12-19, MRN [id number]  PCP:  Noralee Space, MD  Cardiologist: Dr. Irish Lack  Chief Complaint  Patient presents with  . Follow-up    seen for Dr.Varanasi    History of Present Illness:  Jocelyn [name] is a 61 y.o. female history of CAD status post STEMI 06/15/17 treated with stent to the RCA with residual 40% mid LAD, 80% diagonal 2. She had severe radial spasm and subclavian tortuosity.DAPT for at least a year. Statin intolerant on repatha. 2-D echo LVEF 50-55% with grade 1 DD. Previous cath in 2015 with disease in diagonal branch.  Patient comes in today accompanied by her sister for transition of care appointment. Overall she is feeling well. She is walking 15-30 minutes daily but slowly. Her blood pressure is up today and she thinks her losartan was decreased in the hospital. She also ate out yesterday and had locked herself out of her house so had a lot of stress. She denies any chest pain, palpitations, dizziness or presyncope. She is using the inhalers for dyspnea on exertion. She has a history of sarcoidosis but never had a history of asthma or used inhalers until his hospitalization. She is also had a low-grade headache that she's taken Tylenol for. She has not started back on any of her diabetic medications. Her sugars are beginning to climb at home.    Past Medical History:  Diagnosis Date  . Acute bronchitis   . Allergic rhinitis, cause unspecified   . Anemia   . Anxiety   . B12 deficiency   . BV (bacterial vaginosis) 06/22/1996  . Calculus of kidney   . Calculus of kidney   . Coronary atherosclerosis of unspecified type of vessel, native or graft   . Dizziness   . Essential hypertension, benign   . Fibroid 2003  . Fibromyalgia   . H/O dysmenorrhea 2008  . H/O varicella   . Headache(784.0)    frequently  . HSV-2 infection 2009  . Hyperplastic colon polyp 05/16/2014  . Irritable bowel  syndrome   . Meniere's disease, unspecified   . Menses, irregular 2003  . Myalgia and myositis, unspecified   . Obstructive sleep apnea (adult) (pediatric)   . Perimenopausal symptoms 2003  . Pure hypercholesterolemia   . Sarcoidosis   . Type II or unspecified type diabetes mellitus without mention of complication, not stated as uncontrolled   . Unspecified venous (peripheral) insufficiency   . Vitamin D deficiency   . Vulvitis 2010  . Yeast infection     Past Surgical History:  Procedure Laterality Date  . CORONARY STENT INTERVENTION N/A 06/15/2017   Procedure: CORONARY STENT INTERVENTION;  Surgeon: Jettie Booze, MD;  Location: [facility];  Service: Cardiovascular;  Laterality: N/A;  . heart catherization    . LEFT HEART CATH AND CORONARY ANGIOGRAPHY N/A 06/15/2017   Procedure: LEFT HEART CATH AND CORONARY ANGIOGRAPHY;  Surgeon: Jettie Booze, MD;  Location: [facility];  Service: Cardiovascular;  Laterality: N/A;  . TONSILLECTOMY      Current Medications: Current Meds  Medication Sig  . albuterol (PROVENTIL HFA;VENTOLIN HFA) 108 (90 BASE) MCG/ACT inhaler Inhale 1-2 puffs into the lungs every 6 (six) hours as needed for wheezing or shortness of breath.  . ALPRAZolam (XANAX) 0.5 MG tablet Take 0.25 mg by mouth 2 (two) times daily. Take 1 tablet by mouth daily at bedtime  .  aspirin 81 MG chewable tablet Chew 1 tablet (81 mg total) by mouth daily.  . budesonide-formoterol (SYMBICORT) 80-4.5 MCG/ACT inhaler Inhale 2 puffs into the lungs 2 (two) times daily.  [name] CARTIA XT 240 MG 24 hr capsule take 1 capsule by mouth once daily  . Cyanocobalamin 1000 MCG/ML KIT Inject 1,000 mg as directed every 30 (thirty) days.  . Evolocumab (REPATHA SURECLICK) 497 MG/ML SOAJ Inject 1 pen into the skin every 14 (fourteen) days.  . fluticasone (FLONASE) 50 MCG/ACT nasal spray Place 2 sprays into both nostrils at bedtime.  . furosemide (LASIX) 20 MG tablet Take 20 mg by mouth  daily as needed for fluid or edema.  [name] HP ACTHAR 80 UNIT/ML injectable gel Inject 80 Units into the skin 3 (three) times a week.   . JENTADUETO XR 2.03-999 MG TB24 Take 1 tablet by mouth daily.  [name] losartan (COZAAR) 25 MG tablet Take 1 tablet (25 mg total) by mouth daily.  . metoprolol tartrate (LOPRESSOR) 50 MG tablet Take 1 tablet (50 mg total) by mouth 2 (two) times daily.  . nitroGLYCERIN (NITROSTAT) 0.4 MG SL tablet Place 1 tablet (0.4 mg total) under the tongue every 5 (five) minutes as needed for chest pain.  [name] ondansetron (ZOFRAN-ODT) 4 MG disintegrating tablet Take 4 mg by mouth every 8 (eight) hours as needed for nausea or vomiting.  . pantoprazole (PROTONIX) 40 MG tablet Take 1 tablet (40 mg total) by mouth daily.  [name] PREMPRO 0.3-1.5 MG tablet Take 1 tablet by mouth daily.  . ticagrelor (BRILINTA) 90 MG TABS tablet Take 1 tablet (90 mg total) by mouth 2 (two) times daily.  . ticagrelor (BRILINTA) 90 MG TABS tablet Take 1 tablet (90 mg total) by mouth 2 (two) times daily.  . TRINTELLIX 10 MG TABS Take 1 tablet by mouth daily.  . valACYclovir (VALTREX) 500 MG tablet Take 500 mg by mouth daily.      Allergies:   Actos [pioglitazone]; Codeine; Januvia [sitagliptin]; Lactose intolerance (gi); and Morphine   Social History   Social History  . Marital status: Single    Spouse name: N/A  . Number of children: 1  . Years of education: N/A   Social History Main Topics  . Smoking status: Never Smoker  . Smokeless tobacco: Never Used     Comment: Daily Caffeine - 1  Exercise 2-3 times/weekly  . Alcohol use No  . Drug use: No  . Sexual activity: Yes    Birth control/ protection: None   Other Topics Concern  . None   Social History Narrative   Exercises 2-3 times weekly   Caffeine Use: 1 daily (tea or Pepsi)   Lives alone in a one story home.  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       Family History:  The patient's family history includes Other in her unknown relative. She  was adopted.   ROS:   Please see the history of present illness.    Review of Systems  Constitution: Positive for malaise/fatigue.  HENT: Negative.   Eyes: Negative.   Cardiovascular: Positive for dyspnea on exertion.  Respiratory: Negative.   Hematologic/Lymphatic: Negative.   Musculoskeletal: Positive for back pain. Negative for joint pain.  Gastrointestinal: Negative.   Genitourinary: Negative.   Neurological: Positive for headaches.   All other systems reviewed and are negative.   PHYSICAL EXAM:   VS:  BP (!) 142/98   Pulse 90   Ht _0  (1.575 m)   Wt 171  lb 6.4 oz (77.7 kg)   BMI 31.35 kg/m   Physical Exam  GEN: Well nourished, well developed, in no acute distress  Neck: no JVD, carotid bruits, or masses Cardiac:RRR; no murmurs, rubs, or gallops  Respiratory:  clear to auscultation bilaterally, normal work of breathing GI: soft, nontender, nondistended, + BS Ext: Right arm and cath site with small hematoma, no widened pulse or bruit, good radial and brachial pulses, lower extremity mild edema, decreased distal pulses bilaterally Neuro:  Alert and Oriented x 3 Psych: euthymic mood, full affect  Wt Readings from Last 3 Encounters:  06/27/17 171 lb 6.4 oz (77.7 kg)  06/18/17 180 lb 14.4 oz (82.1 kg)  05/05/17 181 lb 8 oz (82.3 kg)      Studies/Labs Reviewed:   EKG:  EKG is not ordered today.    Recent Labs: 08/18/2016: ALT 17 06/16/2017: BUN 7; Creatinine, Ser 0.71; Hemoglobin 11.6; Platelets 235; Potassium 3.3; Sodium 138   Lipid Panel    Component Value Date/Time   CHOL 182 06/15/2017 1205   TRIG 163 (H) 06/15/2017 1205   HDL 56 06/15/2017 1205   CHOLHDL 3.3 06/15/2017 1205   VLDL 33 06/15/2017 1205   LDLCALC 93 06/15/2017 1205   LDLDIRECT 91.6 08/14/2014 0822    Additional studies/ records that were reviewed today include:   CORONARY STENT INTERVENTION   06/15/17  LEFT HEART CATH AND CORONARY ANGIOGRAPHY  Conclusion       Mid LAD lesion, 40  %stenosed.  Ost 2nd Diag to 2nd Diag lesion, 80 %stenosed. This appears to be old based on prior cath report.  Dist RCA-1 lesion, 20 %stenosed.  Mid RCA lesion, 10 %stenosed.  Dist RCA-2 lesion, 100 %stenosed. This is the culprit lesion and was treated with aspiration thrombectomy and stent placement.  A STENT PROMUS PREM MR 3.5X16 drug eluting stent was successfully placed, postdilated to 3.8 mm.  Post intervention, there is a 0% residual stenosis.  LV end diastolic pressure is normal.  There is no aortic valve stenosis.  Severe radial spasm and subclavian tortuosity. Would consider femoral approach if angiogram was needed in the future.   Continue DAPT for at least a year along with secondary prevention.     Statin intolerant.  Taking Repatha.     Will need DM control.     Watch in ICU.  Continue tirofiban for 2 hours.  Will start beta blocker as BP tolerates.  Holding ARB.    Cath 06/16/17 Study Conclusions   - Left ventricle: The cavity size was normal. There was mild   concentric hypertrophy. Systolic function was normal. The   estimated ejection fraction was in the range of 50% to 55%. Wall   motion was normal; there were no regional wall motion   abnormalities. Doppler parameters are consistent with abnormal   left ventricular relaxation (grade 1 diastolic dysfunction). - Aortic valve: There was no regurgitation. - Mitral valve: There was mild regurgitation. - Right ventricle: The cavity size was normal. Wall thickness was   normal. Systolic function was normal. - Atrial septum: No defect or patent foramen ovale was identified   by color flow Doppler. - Tricuspid valve: There was no regurgitation.      ASSESSMENT:    1. Atherosclerosis of native coronary artery of native heart without angina pectoris   2. HYPERTENSION, BENIGN   3. Mixed hyperlipidemia      PLAN:  In order of problems listed above:   CAD status post STEMI 06/15/17 treated  with stenting to  the RCA with residual disease as listed above. Recommend DAPT for at least one year.Recommend she start cardiac rehabilitation. Has f/u with Dr. Irish Lack later this month she'd like to keep.  Hypertension blood pressure elevated today. She is also having headaches. I've asked her to take her blood pressure daily at home and call us if it remains elevated. We could increase her Cozaar to 50 mg daily if needed. 2 g sodium diet given.  Mixed hyperlipidemia statin intolerant on Repatha  Diabetes mellitus patient stopped her diabetes medications in the hospital and hasn't restarted them. Have asked her to restart her meds.  Medication Adjustments/Labs and Tests Ordered: Current medicines are reviewed at length with the patient today.  Concerns regarding medicines are outlined above.  Medication changes, Labs and Tests ordered today are listed in the Patient Instructions below. There are no Patient Instructions on file for this visit.   Sumner Boast, PA-C  06/27/2017 12:40 PM    [facility] [place], [place], [place]  [address] Phone: [phone]; Fax: [phone]

## 2017-06-29 ENCOUNTER — Telehealth: Payer: Self-pay | Admitting: Interventional Cardiology

## 2017-06-29 MED ORDER — LOSARTAN POTASSIUM 50 MG PO TABS: 50.0000 mg | ORAL_TABLET | Freq: Every day | ORAL | Status: DC

## 2017-06-29 NOTE — Telephone Encounter (Signed)
New message    Pt states she came in and saw Jocelyn [name] earlier in the week and was told to call if BP keeps going up.   Pt c/o BP issue: STAT if pt c/o blurred vision, one-sided weakness or slurred speech  1. What are your last 5 BP readings? 151/101, 152/80, 163/96  2. Are you having any other symptoms (ex. Dizziness, headache, blurred vision, passed out)? headache  3. What is your BP issue? BP high.

## 2017-06-29 NOTE — Telephone Encounter (Signed)
Called patient about her message. Patient reports BP's 151/101, 152/80, 163/96, and HR 90. Patient complaining of headache. Per Estella Husk PA office visit note on 06/27/17- "Hypertension blood pressure elevated today. She is also having headaches. I've asked her to take her blood pressure daily at home and call us if it remains elevated. We could increase her Cozaar to 50 mg daily if needed. 2 g sodium diet given." Consulted Dr. Angelena Form (DOD), okay to start Cozaar 50 mg by mouth daily and check BMET in one week. Patient has appointment next Friday with Dr. Irish Lack, will have patient get BMET on Friday at her appointment. Patient verbalized understanding.

## 2017-06-30 ENCOUNTER — Telehealth: Payer: Self-pay | Admitting: Interventional Cardiology

## 2017-06-30 NOTE — Telephone Encounter (Signed)
Patient calling and states that she has had high BP and was advised when she was seen on Monday that if her BP continues to be high to call the office for recommendations. Patient called the office yesterday for recommendations and was instructed by DOD to increase her Cozaar to 50 mg daily and keep her appointment next Friday 8/24 with Dr. Irish Lack and have a BMET at this appointment. Patient calling back today and asking if she should be seen sooner and to find out if there are any additional recommendations. Patient currently takes Cartia 240 mg QD, metoprolol 50 mg BID, and increased her Cozaar to 50 mg QD yesterday. Patient states that her BP this morning was 161/92 and after she took her medicine that it went down to 151/82 with HR in the 90s. Patient denies having any symptoms other than a HA for which she is taking Tylenol for. Patient advised to continue the increased dose of losartan allowing time for it to work, restrict the amount of sodium in her diet to 2 grams per day, and to keep her appointment with Dr. Irish Lack on 8/24. Patient made aware that the information would be forwarded to Dr. Irish Lack for review and that if there were any additional recommendations that we would call her. Patient verbalized understanding and thanked me for the call.

## 2017-06-30 NOTE — Telephone Encounter (Signed)
New Message  Pt call requesting to speak with RN about getting a sooner appt for her high bp. Please call back to Discuss.

## 2017-06-30 NOTE — Telephone Encounter (Signed)
Patient made aware of Dr. Hassell Done recommendations. Patient verbalizes understanding and is in agreement with this plan.

## 2017-06-30 NOTE — Telephone Encounter (Signed)
If her BP stays high after a few more days ( > 140/90), can increase losartan to 100 mg daily.

## 2017-07-04 MED ORDER — TRAMADOL HCL 50 MG PO TABS: 50.0000 mg | ORAL_TABLET | Freq: Four times a day (QID) | ORAL | 0 refills | Status: DC | PRN

## 2017-07-04 MED ORDER — LOSARTAN POTASSIUM 100 MG PO TABS: 100.0000 mg | ORAL_TABLET | Freq: Every day | ORAL | 0 refills | Status: DC

## 2017-07-04 NOTE — Telephone Encounter (Signed)
Called and made patient aware of Dr. Hassell Done recommendations. Patient verbalized understanding.Tramadol called in to patient's preferred pharmacy ([facility] on [address]).

## 2017-07-04 NOTE — Telephone Encounter (Signed)
Can try tramadol 50 mg tab PO q 6 hour prn severe pain.  Disp 30, no efills.  Hopefully, BP coming down will decrease headaches.

## 2017-07-04 NOTE — Telephone Encounter (Signed)
Called and spoke to patient who states that her BP is still elevated as follows:  Friday: 181/107 Saturday: 159/101 Sunday: 157/103 Today: 152/98  Per 8/16 phone note patient was advised to continue to monitor BP for a few days since losartan was just increased and let us know if her BP remains elevated >140/90. Per Dr. Irish Lack if BP remained elevated we could increase losartan to 100 mg QD. Advised for patient to limit her sodium intake, increase her losartan to 100 mg QD, and keep her appointment on Friday. Patient states that she still is having Has with her BP elevated and wants to know if she can take something other than Tylenol. Made patient aware that I would make Dr. Irish Lack aware and let her know if he has any further recommendations. Patient verbalized understanding and thanked me for the call.

## 2017-07-04 NOTE — Telephone Encounter (Signed)
Follow Up:     Pt said she was supposed to call you back today.

## 2017-07-07 ENCOUNTER — Telehealth (HOSPITAL_COMMUNITY): Payer: Self-pay

## 2017-07-07 NOTE — Telephone Encounter (Signed)
Cardiac Rehab Medication Review by a Pharmacist  Does the patient  feel that his/her medications are working for him/her?  yes  Has the patient been experiencing any side effects to the medications prescribed?  no  Does the patient measure his/her own blood pressure or blood glucose at home?  yes   Does the patient have any problems obtaining medications due to transportation or finances?   no  Understanding of regimen: good Understanding of indications: good Potential of compliance: good    Pharmacist comments: Patient was able to tell me her medication regimen. She said she has been experiencing headaches and higher blood pressures. She has a doctor's appt tomorrow and will bring up those issues then. She reports no issues with med costs or transportation.     Blaine Hamper Jarryd Gratz 07/07/2017 5:46 PM

## 2017-07-08 ENCOUNTER — Ambulatory Visit (INDEPENDENT_AMBULATORY_CARE_PROVIDER_SITE_OTHER): Payer: BC Managed Care – PPO | Admitting: Interventional Cardiology

## 2017-07-08 ENCOUNTER — Encounter: Payer: Self-pay | Admitting: *Deleted

## 2017-07-08 ENCOUNTER — Encounter (INDEPENDENT_AMBULATORY_CARE_PROVIDER_SITE_OTHER): Payer: Self-pay

## 2017-07-08 ENCOUNTER — Encounter: Payer: Self-pay | Admitting: Interventional Cardiology

## 2017-07-08 VITALS — BP 110/80 | HR 84 | Ht 62.0 in | Wt 179.2 lb

## 2017-07-08 DIAGNOSIS — E1159 Type 2 diabetes mellitus with other circulatory complications: Secondary | ICD-10-CM

## 2017-07-08 DIAGNOSIS — E782 Mixed hyperlipidemia: Secondary | ICD-10-CM

## 2017-07-08 DIAGNOSIS — I2111 ST elevation (STEMI) myocardial infarction involving right coronary artery: Secondary | ICD-10-CM

## 2017-07-08 DIAGNOSIS — I10 Essential (primary) hypertension: Secondary | ICD-10-CM | POA: Diagnosis not present

## 2017-07-08 DIAGNOSIS — Z006 Encounter for examination for normal comparison and control in clinical research program: Secondary | ICD-10-CM

## 2017-07-08 NOTE — Progress Notes (Signed)
Jocelyn [name] Study Patient was consented to the [employer]. Genotype blood drawn for the study today.   All elements of the informed consent form,study requirements and expectations were reviewed with the patient,and all questions and concerns were identified and addressed prior to thwe signing of the consent. No procedures were performed prior to consenting the patient.The patient was given an adequate amount of time to make an informed decision. A copy of the informed consent was provided to the patient to take home. 07/08/17 14:30

## 2017-07-08 NOTE — Progress Notes (Signed)
Cardiology Office Note   Date:  07/08/2017   ID:  Jocelyn [name], DOB Mar 17, 1956, MRN [id number]  PCP:  Jocelyn Space, MD    Chief Complaint  Patient presents with  . Follow-up   CAD/MI  Wt Readings from Last 3 Encounters:  07/08/17 179 lb 3.2 oz (81.3 kg)  06/27/17 171 lb 6.4 oz (77.7 kg)  06/18/17 180 lb 14.4 oz (82.1 kg)       History of Present Illness: Jocelyn [name] is a 61 y.o. female  With CAD.  CAth several years ago Showed branch vessel disease. She has had difficulty tolerating statins. Eventually, she was put on Repatha.  She had an inferior STEMI on 06/15/2017. She received a drug-eluting stent to her distal right coronary artery. She had mild LAD disease. Her diagonal vessel disease has persisted over the years. Of note, she had severe radial spasm and it was noted that she had subclavian artery lusoria variant which made right radial approach quite difficult. This anatomic abnormality had been noted on a prior CT scan done at [facility].  She has had issues with blood pressure since leaving the hospital. Her losartan has been increased back to her usual dose. She has had some headaches as well. Tylenol was not sufficient. A prescription for Ultram was called in.  Denies : Chest pain. Dizziness. Leg edema. Nitroglycerin use. Orthopnea. Palpitations. Paroxysmal nocturnal dyspnea. Shortness of breath. Syncope.   Headaches are better since the Ultram started.  Home BP cuff reading higher than ofice reading.       Past Medical History:  Diagnosis Date  . Acute bronchitis   . Allergic rhinitis, cause unspecified   . Anemia   . Anxiety   . B12 deficiency   . BV (bacterial vaginosis) 06/22/1996  . Calculus of kidney   . Calculus of kidney   . Coronary atherosclerosis of unspecified type of vessel, native or graft   . Dizziness   . Essential hypertension, benign   . Fibroid 2003  . Fibromyalgia   . H/O dysmenorrhea 2008  . H/O varicella   .  Headache(784.0)    frequently  . HSV-2 infection 2009  . Hyperplastic colon polyp 05/16/2014  . Irritable bowel syndrome   . Meniere's disease, unspecified   . Menses, irregular 2003  . Myalgia and myositis, unspecified   . Obstructive sleep apnea (adult) (pediatric)   . Perimenopausal symptoms 2003  . Pure hypercholesterolemia   . Sarcoidosis   . Type II or unspecified type diabetes mellitus without mention of complication, not stated as uncontrolled   . Unspecified venous (peripheral) insufficiency   . Vitamin D deficiency   . Vulvitis 2010  . Yeast infection     Past Surgical History:  Procedure Laterality Date  . CORONARY STENT INTERVENTION N/A 06/15/2017   Procedure: CORONARY STENT INTERVENTION;  Surgeon: Jocelyn Booze, MD;  Location: [facility];  Service: Cardiovascular;  Laterality: N/A;  . heart catherization    . LEFT HEART CATH AND CORONARY ANGIOGRAPHY N/A 06/15/2017   Procedure: LEFT HEART CATH AND CORONARY ANGIOGRAPHY;  Surgeon: Jocelyn Booze, MD;  Location: [facility];  Service: Cardiovascular;  Laterality: N/A;  . TONSILLECTOMY       Current Outpatient Prescriptions  Medication Sig Dispense Refill  . ALPRAZolam (XANAX) 0.5 MG tablet Take 0.25 mg by mouth 2 (two) times daily. Take 1 tablet by mouth daily at bedtime    . aspirin 81 MG chewable  tablet Chew 1 tablet (81 mg total) by mouth daily.    [name] CARTIA XT 240 MG 24 hr capsule take 1 capsule by mouth once daily 90 capsule 3  . Cyanocobalamin 1000 MCG/ML KIT Inject 1,000 mg as directed every 30 (thirty) days.    . Evolocumab (REPATHA SURECLICK) 950 MG/ML SOAJ Inject 1 pen into the skin every 14 (fourteen) days. 2 pen 11  . fluticasone (FLONASE) 50 MCG/ACT nasal spray Place 2 sprays into both nostrils at bedtime. 16 g 2  . furosemide (LASIX) 20 MG tablet Take 20 mg by mouth daily as needed for fluid or edema.    [name] HP ACTHAR 80 UNIT/ML injectable gel Inject 80 Units into the skin 3 (three)  times a week.     . JENTADUETO XR 2.03-999 MG TB24 Take 1 tablet by mouth daily.  0  . losartan (COZAAR) 100 MG tablet Take 1 tablet (100 mg total) by mouth daily. 30 tablet 0  . metoprolol tartrate (LOPRESSOR) 50 MG tablet Take 1 tablet (50 mg total) by mouth 2 (two) times daily. 60 tablet 5  . mometasone-formoterol (DULERA) 100-5 MCG/ACT AERO Inhale 2 puffs into the lungs 2 (two) times daily.    . nitroGLYCERIN (NITROSTAT) 0.4 MG SL tablet Place 1 tablet (0.4 mg total) under the tongue every 5 (five) minutes as needed for chest pain. 25 tablet 2  . ondansetron (ZOFRAN-ODT) 4 MG disintegrating tablet Take 4 mg by mouth every 8 (eight) hours as needed for nausea or vomiting.    . pantoprazole (PROTONIX) 40 MG tablet Take 1 tablet (40 mg total) by mouth daily. 30 tablet 11  . PREMPRO 0.3-1.5 MG tablet Take 1 tablet by mouth daily.  0  . ticagrelor (BRILINTA) 90 MG TABS tablet Take 1 tablet (90 mg total) by mouth 2 (two) times daily. 180 tablet 3  . traMADol (ULTRAM) 50 MG tablet Take 1 tablet (50 mg total) by mouth every 6 (six) hours as needed for severe pain. 30 tablet 0  . TRINTELLIX 10 MG TABS Take 1 tablet by mouth daily.  0  . valACYclovir (VALTREX) 500 MG tablet Take 500 mg by mouth daily.     [name] albuterol (PROVENTIL HFA;VENTOLIN HFA) 108 (90 BASE) MCG/ACT inhaler Inhale 1-2 puffs into the lungs every 6 (six) hours as needed for wheezing or shortness of breath. (Patient not taking: Reported on 07/08/2017) 8 g 2   No current facility-administered medications for this visit.     Allergies:   Actos [pioglitazone]; Codeine; Januvia [sitagliptin]; Lactose intolerance (gi); and Morphine    Social History:  The patient  reports that she has never smoked. She has never used smokeless tobacco. She reports that she does not drink alcohol or use drugs.   Family History:  The patient's family history includes Other in her unknown relative. She was adopted.    ROS:  Please see the history of present  illness.   Otherwise, review of systems are positive for headaches.   All other systems are reviewed and negative.    PHYSICAL EXAM: VS:  BP 110/80   Pulse 84   Ht 5' 2"  (1.575 m)   Wt 179 lb 3.2 oz (81.3 kg)   BMI 32.78 kg/m  , BMI Body mass index is 32.78 kg/m. GEN: Well nourished, well developed, in no acute distress  HEENT: normal  Neck: no JVD, carotid bruits, or masses Cardiac: RRR; no murmurs, rubs, or gallops,; 1 + pitting edema in bilateral lower extremities Respiratory:  clear to auscultation bilaterally, normal work of breathing GI: soft, nontender, nondistended, + BS MS: no deformity or atrophy ; no radial hematoma Skin: warm and dry, no rash Neuro:  Strength and sensation are intact Psych: euthymic mood, full affect    Recent Labs: 08/18/2016: ALT 17 06/16/2017: BUN 7; Creatinine, Ser 0.71; Hemoglobin 11.6; Platelets 235; Potassium 3.3; Sodium 138   Lipid Panel    Component Value Date/Time   CHOL 182 06/15/2017 1205   TRIG 163 (H) 06/15/2017 1205   HDL 56 06/15/2017 1205   CHOLHDL 3.3 06/15/2017 1205   VLDL 33 06/15/2017 1205   LDLCALC 93 06/15/2017 1205   LDLDIRECT 91.6 08/14/2014 0822     Other studies Reviewed: Additional studies/ records that were reviewed today with results demonstrating: cath results reviewed.   ASSESSMENT AND PLAN:  1. CAD/recent inferior MI : s/p PCI of distal RCA with a DES.  Continue dual antiplatelet therapy.  No bleeding problems.  She will start cardiac rehab in a few days.   2. HTN: Better today.  126/72 with large cuff in the office by my check.  COntinue increased dose ARB.  3. Hyperlipidemia:  Continue Repatha.  Statin intolerant.  LDL target 70.  4. DM: COntinue aggressive DM control.    Current medicines are reviewed at length with the patient today.  The patient concerns regarding her medicines were addressed.  The following changes have been made:  No change  Labs/ tests ordered today include:  No orders of the  defined types were placed in this encounter.   Recommend 150 minutes/week of aerobic exercise Low fat, low carb, high fiber diet recommended  Disposition:   FU in 6 months   Signed, Larae Grooms, MD  07/08/2017 3:43 PM    [facility] [address], [place], [place]  [address] Phone: [phone]; Fax: [phone]

## 2017-07-08 NOTE — Patient Instructions (Signed)

## 2017-07-09 LAB — BASIC METABOLIC PANEL
BUN / CREAT RATIO: 16 (ref 12–28)
BUN: 17 mg/dL (ref 8–27)
CO2: 17 mmol/L — ABNORMAL LOW (ref 20–29)
CREATININE: 1.05 mg/dL — AB (ref 0.57–1.00)
Calcium: 9.7 mg/dL (ref 8.7–10.3)
Chloride: 105 mmol/L (ref 96–106)
GFR, EST AFRICAN AMERICAN: 66 mL/min/{1.73_m2} (ref >59)
GFR, EST NON AFRICAN AMERICAN: 57 mL/min/{1.73_m2} — AB (ref >59)
Glucose: 104 mg/dL — ABNORMAL HIGH (ref 65–99)
POTASSIUM: 4 mmol/L (ref 3.5–5.2)
SODIUM: 142 mmol/L (ref 134–144)

## 2017-07-11 ENCOUNTER — Telehealth (HOSPITAL_COMMUNITY): Payer: Self-pay

## 2017-07-12 ENCOUNTER — Encounter (HOSPITAL_COMMUNITY): Payer: Self-pay

## 2017-07-12 ENCOUNTER — Encounter (HOSPITAL_COMMUNITY)
Admission: RE | Admit: 2017-07-12 | Discharge: 2017-07-12 | Disposition: A | Discharge status: Home / Self Care | Payer: BC Managed Care – PPO | Source: Ambulatory Visit | Attending: Interventional Cardiology | Admitting: Interventional Cardiology

## 2017-07-12 VITALS — BP 110/78 | HR 89 | Ht 62.5 in | Wt 181.0 lb

## 2017-07-12 DIAGNOSIS — Z955 Presence of coronary angioplasty implant and graft: Secondary | ICD-10-CM | POA: Diagnosis present

## 2017-07-12 DIAGNOSIS — Z48812 Encounter for surgical aftercare following surgery on the circulatory system: Secondary | ICD-10-CM | POA: Insufficient documentation

## 2017-07-12 DIAGNOSIS — I2111 ST elevation (STEMI) myocardial infarction involving right coronary artery: Secondary | ICD-10-CM | POA: Diagnosis not present

## 2017-07-12 DIAGNOSIS — I213 ST elevation (STEMI) myocardial infarction of unspecified site: Secondary | ICD-10-CM

## 2017-07-12 NOTE — Progress Notes (Signed)
Cardiac Individual Treatment Plan  Patient Details  Name: Jocelyn [name] MRN: [id number] Date of Birth: December 17, 1955 Referring Provider:     CARDIAC REHAB PHASE II ORIENTATION from 07/12/2017 in [place]  Referring Provider  Thamas Jaegers MD      Initial Encounter Date:    CARDIAC REHAB PHASE II ORIENTATION from 07/12/2017 in [place]  Date  07/12/17  Referring Provider  Thamas Jaegers MD      Visit Diagnosis: 06/15/17 ST elevation myocardial infarction (STEMI), unspecified artery ([place])  06/15/17 Status post coronary artery stent placement  Patient's Home Medications on Admission:  Current Outpatient Prescriptions:  .  albuterol (PROVENTIL HFA;VENTOLIN HFA) 108 (90 BASE) MCG/ACT inhaler, Inhale 1-2 puffs into the lungs every 6 (six) hours as needed for wheezing or shortness of breath. (Patient not taking: Reported on 07/08/2017), Disp: 8 g, Rfl: 2 .  ALPRAZolam (XANAX) 0.5 MG tablet, Take 0.25 mg by mouth 2 (two) times daily. Take 1 tablet by mouth daily at bedtime, Disp: , Rfl:  .  aspirin 81 MG chewable tablet, Chew 1 tablet (81 mg total) by mouth daily., Disp: , Rfl:  .  CARTIA XT 240 MG 24 hr capsule, take 1 capsule by mouth once daily, Disp: 90 capsule, Rfl: 3 .  Cyanocobalamin 1000 MCG/ML KIT, Inject 1,000 mg as directed every 30 (thirty) days., Disp: , Rfl:  .  Evolocumab (REPATHA SURECLICK) 481 MG/ML SOAJ, Inject 1 pen into the skin every 14 (fourteen) days., Disp: 2 pen, Rfl: 11 .  fluticasone (FLONASE) 50 MCG/ACT nasal spray, Place 2 sprays into both nostrils at bedtime., Disp: 16 g, Rfl: 2 .  furosemide (LASIX) 20 MG tablet, Take 20 mg by mouth daily as needed for fluid or edema., Disp: , Rfl:  .  HP ACTHAR 80 UNIT/ML injectable gel, Inject 80 Units into the skin 3 (three) times a week. , Disp: , Rfl:  .  JENTADUETO XR 2.03-999 MG TB24, Take 1 tablet by mouth daily., Disp: , Rfl: 0 .  losartan (COZAAR) 100  MG tablet, Take 1 tablet (100 mg total) by mouth daily., Disp: 30 tablet, Rfl: 0 .  metoprolol tartrate (LOPRESSOR) 50 MG tablet, Take 1 tablet (50 mg total) by mouth 2 (two) times daily., Disp: 60 tablet, Rfl: 5 .  mometasone-formoterol (DULERA) 100-5 MCG/ACT AERO, Inhale 2 puffs into the lungs 2 (two) times daily., Disp: , Rfl:  .  nitroGLYCERIN (NITROSTAT) 0.4 MG SL tablet, Place 1 tablet (0.4 mg total) under the tongue every 5 (five) minutes as needed for chest pain., Disp: 25 tablet, Rfl: 2 .  ondansetron (ZOFRAN-ODT) 4 MG disintegrating tablet, Take 4 mg by mouth every 8 (eight) hours as needed for nausea or vomiting., Disp: , Rfl:  .  pantoprazole (PROTONIX) 40 MG tablet, Take 1 tablet (40 mg total) by mouth daily., Disp: 30 tablet, Rfl: 11 .  PREMPRO 0.3-1.5 MG tablet, Take 1 tablet by mouth daily., Disp: , Rfl: 0 .  ticagrelor (BRILINTA) 90 MG TABS tablet, Take 1 tablet (90 mg total) by mouth 2 (two) times daily., Disp: 180 tablet, Rfl: 3 .  traMADol (ULTRAM) 50 MG tablet, Take 1 tablet (50 mg total) by mouth every 6 (six) hours as needed for severe pain., Disp: 30 tablet, Rfl: 0 .  TRINTELLIX 10 MG TABS, Take 1 tablet by mouth daily., Disp: , Rfl: 0 .  valACYclovir (VALTREX) 500 MG tablet, Take 500 mg by mouth daily. , Disp: ,  Rfl:   Past Medical History: Past Medical History:  Diagnosis Date  . Acute bronchitis   . Allergic rhinitis, cause unspecified   . Anemia   . Anxiety   . B12 deficiency   . BV (bacterial vaginosis) 06/22/1996  . Calculus of kidney   . Calculus of kidney   . Coronary atherosclerosis of unspecified type of vessel, native or graft   . Dizziness   . Essential hypertension, benign   . Fibroid 2003  . Fibromyalgia   . H/O dysmenorrhea 2008  . H/O varicella   . Headache(784.0)    frequently  . HSV-2 infection 2009  . Hyperplastic colon polyp 05/16/2014  . Irritable bowel syndrome   . Meniere's disease, unspecified   . Menses, irregular 2003  . Myalgia and  myositis, unspecified   . Obstructive sleep apnea (adult) (pediatric)   . Perimenopausal symptoms 2003  . Pure hypercholesterolemia   . Sarcoidosis   . Type II or unspecified type diabetes mellitus without mention of complication, not stated as uncontrolled   . Unspecified venous (peripheral) insufficiency   . Vitamin D deficiency   . Vulvitis 2010  . Yeast infection     Tobacco Use: History  Smoking Status  . Never Smoker  Smokeless Tobacco  . Never Used    Comment: Daily Caffeine - 1  Exercise 2-3 times/weekly    Labs: Recent Review Flowsheet Data    Labs for ITP Cardiac and Pulmonary Rehab Latest Ref Rng & Units 08/14/2014 01/27/2015 06/01/2016 08/18/2016 06/15/2017   Cholestrol 0 - 200 mg/dL 167 181 296(H) 191 182   LDLCALC 0 - 99 mg/dL - 104(H) 202(H) 107 93   LDLDIRECT mg/dL 91.6 - - - -   HDL >40 mg/dL 46.90 44.40 53 41(L) 56   Trlycerides <150 mg/dL 215.0(H) 162.0(H) 205(H) 214(H) 163(H)   Hemoglobin A1c 4.6 - 6.5 % - - - - -   TCO2 0 - 100 mmol/L - - - - 22      Capillary Blood Glucose: Lab Results  Component Value Date   GLUCAP 136 (H) 06/18/2017   GLUCAP 115 (H) 06/18/2017   GLUCAP 146 (H) 06/17/2017   GLUCAP 127 (H) 06/17/2017   GLUCAP 129 (H) 06/17/2017     Exercise Target Goals: Date: 07/12/17  Exercise Program Goal: Individual exercise prescription set with THRR, safety & activity barriers. Participant demonstrates ability to understand and report RPE using BORG scale, to self-measure pulse accurately, and to acknowledge the importance of the exercise prescription.  Exercise Prescription Goal: Starting with aerobic activity 30 plus minutes a day, 3 days per week for initial exercise prescription. Provide home exercise prescription and guidelines that participant acknowledges understanding prior to discharge.  Activity Barriers & Risk Stratification:     Activity Barriers & Cardiac Risk Stratification - 07/12/17 1452      Activity Barriers & Cardiac  Risk Stratification   Activity Barriers Back Problems;Balance Concerns;Other (comment)   Comments B knee pain R>L   Cardiac Risk Stratification High      6 Minute Walk:     6 Minute Walk    Row Name 07/12/17 1429 07/12/17 1506 07/12/17 1513     6 Minute Walk   Phase Initial  -  -   Distance 1246 feet  -  -   Walk Time 6 minutes  -  -   # of Rest Breaks 0  -  -   MPH 2.4  -  -   METS 2.8  -  -  RPE 12  -  -   VO2 Peak 9.8  -  -   Symptoms  -  - Yes (comment)   Comments  -  - fatigue at end of walk test   Resting HR 89 bpm  -  -   Resting BP 110/78  -  -   Resting Oxygen Saturation  98 %  -  -   Exercise Oxygen Saturation  during 6 min walk 97 %  -  -   Max Ex. HR 101 bpm  -  -   Max Ex. BP 118/70  -  -   2 Minute Post BP  - 100/70  -      Oxygen Initial Assessment:   Oxygen Re-Evaluation:   Oxygen Discharge (Final Oxygen Re-Evaluation):   Initial Exercise Prescription:     Initial Exercise Prescription - 07/12/17 1500      Date of Initial Exercise RX and Referring Provider   Date 07/12/17   Referring Provider Thamas Jaegers MD     Treadmill   MPH 2.2   Grade 0   Minutes 10   METs 2.68     Recumbant Bike   Level 2   Watts 10   Minutes 10   METs 2.38     NuStep   Level 2   SPM 70   Minutes 10   METs 2     Track   Laps 8   Minutes 10   METs 2.39     Prescription Details   Frequency (times per week) 3   Duration Progress to 30 minutes of continuous aerobic without signs/symptoms of physical distress     Intensity   THRR 40-80% of Max Heartrate 63-126   Ratings of Perceived Exertion 11-13   Perceived Dyspnea 0-4     Progression   Progression Continue to progress workloads to maintain intensity without signs/symptoms of physical distress.     Resistance Training   Training Prescription Yes   Weight 2lbs   Reps 10-15      Perform Capillary Blood Glucose checks as needed.  Exercise Prescription Changes:   Exercise  Comments:   Exercise Goals and Review:     Exercise Goals    Row Name 07/12/17 1507 07/12/17 1508           Exercise Goals   Increase Physical Activity Yes  -      Intervention Provide advice, education, support and counseling about physical activity/exercise needs.;Develop an individualized exercise prescription for aerobic and resistive training based on initial evaluation findings, risk stratification, comorbidities and participant's personal goals.  -      Expected Outcomes Achievement of increased cardiorespiratory fitness and enhanced flexibility, muscular endurance and strength shown through measurements of functional capacity and personal statement of participant.  -      Increase Strength and Stamina Yes  -      Intervention Provide advice, education, support and counseling about physical activity/exercise needs.;Develop an individualized exercise prescription for aerobic and resistive training based on initial evaluation findings, risk stratification, comorbidities and participant's personal goals.  -      Expected Outcomes Achievement of increased cardiorespiratory fitness and enhanced flexibility, muscular endurance and strength shown through measurements of functional capacity and personal statement of participant.  -      Able to understand and use rate of perceived exertion (RPE) scale  - Yes      Intervention  - Provide education and explanation on how to use RPE scale  Expected Outcomes  - Long Term:  Able to use RPE to guide intensity level when exercising independently;Short Term: Able to use RPE daily in rehab to express subjective intensity level      Knowledge and understanding of Target Heart Rate Range (THRR)  - Yes      Intervention  - Provide education and explanation of THRR including how the numbers were predicted and where they are located for reference      Expected Outcomes  - Short Term: Able to state/look up THRR;Short Term: Able to use daily as guideline  for intensity in rehab;Long Term: Able to use THRR to govern intensity when exercising independently      Able to check pulse independently  - Yes      Understanding of Exercise Prescription  - Yes      Intervention  - Provide education, explanation, and written materials on patient's individual exercise prescription      Expected Outcomes  - Long Term: Able to explain home exercise prescription to exercise independently;Short Term: Able to explain program exercise prescription         Exercise Goals Re-Evaluation :    Discharge Exercise Prescription (Final Exercise Prescription Changes):   Nutrition:  Target Goals: Understanding of nutrition guidelines, daily intake of sodium <1557m, cholesterol <2029m calories 30% from fat and 7% or less from saturated fats, daily to have 5 or more servings of fruits and vegetables.  Biometrics:     Pre Biometrics - 07/12/17 1512      Pre Biometrics   % Body Fat 32.2 %   Flexibility 0 in       Nutrition Therapy Plan and Nutrition Goals:   Nutrition Discharge: Nutrition Scores:   Nutrition Goals Re-Evaluation:   Nutrition Goals Re-Evaluation:   Nutrition Goals Discharge (Final Nutrition Goals Re-Evaluation):   Psychosocial: Target Goals: Acknowledge presence or absence of significant depression and/or stress, maximize coping skills, provide positive support system. Participant is able to verbalize types and ability to use techniques and skills needed for reducing stress and depression.  Initial Review & Psychosocial Screening:     Initial Psych Review & Screening - 07/12/17 1556      Initial Review   Current issues with None Identified     Family Dynamics   Good Support System? Yes  family    Comments upon brief assessment, no psychosocial needs identified, no interventions necessary      Barriers   Psychosocial barriers to participate in program There are no identifiable barriers or psychosocial needs.     Screening  Interventions   Interventions Encouraged to exercise      Quality of Life Scores:     Quality of Life - 07/12/17 1412      Quality of Life Scores   Health/Function Pre 19.85 %   Socioeconomic Pre 24.75 %   Psych/Spiritual Pre 23.93 %   Family Pre 28.5 %   GLOBAL Pre 22.93 %      PHQ-9: Recent Review Flowsheet Data    [facility]   Decreased Interest 0   Down, Depressed, Hopeless 0   PHQ - 2 Score 0     Interpretation of Total Score  Total Score Depression Severity:  1-4 = Minimal depression, 5-9 = Mild depression, 10-14 = Moderate depression, 15-19 = Moderately severe depression, 20-27 = Severe depression   Psychosocial Evaluation and Intervention:   Psychosocial Re-Evaluation:   Psychosocial Discharge (Final Psychosocial Re-Evaluation):   Vocational Rehabilitation: Provide vocational  rehab assistance to qualifying candidates.   Vocational Rehab Evaluation & Intervention:   Education: Education Goals: Education classes will be provided on a weekly basis, covering required topics. Participant will state understanding/return demonstration of topics presented.  Learning Barriers/Preferences:     Learning Barriers/Preferences - 07/12/17 1452      Learning Barriers/Preferences   Learning Barriers Sight   Learning Preferences Pictoral;Video      Education Topics: Count Your Pulse:  -Group instruction provided by verbal instruction, demonstration, patient participation and written materials to support subject.  Instructors address importance of being able to find your pulse and how to count your pulse when at home without a heart monitor.  Patients get hands on experience counting their pulse with staff help and individually.   Heart Attack, Angina, and Risk Factor Modification:  -Group instruction provided by verbal instruction, video, and written materials to support subject.  Instructors address signs and symptoms of angina and  heart attacks.    Also discuss risk factors for heart disease and how to make changes to improve heart health risk factors.   Functional Fitness:  -Group instruction provided by verbal instruction, demonstration, patient participation, and written materials to support subject.  Instructors address safety measures for doing things around the house.  Discuss how to get up and down off the floor, how to pick things up properly, how to safely get out of a chair without assistance, and balance training.   Meditation and Mindfulness:  -Group instruction provided by verbal instruction, patient participation, and written materials to support subject.  Instructor addresses importance of mindfulness and meditation practice to help reduce stress and improve awareness.  Instructor also leads participants through a meditation exercise.    Stretching for Flexibility and Mobility:  -Group instruction provided by verbal instruction, patient participation, and written materials to support subject.  Instructors lead participants through series of stretches that are designed to increase flexibility thus improving mobility.  These stretches are additional exercise for major muscle groups that are typically performed during regular warm up and cool down.   Hands Only CPR:  -Group verbal, video, and participation provides a basic overview of AHA guidelines for community CPR. Role-play of emergencies allow participants the opportunity to practice calling for help and chest compression technique with discussion of AED use.   Hypertension: -Group verbal and written instruction that provides a basic overview of hypertension including the most recent diagnostic guidelines, risk factor reduction with self-care instructions and medication management.    Nutrition I class: Heart Healthy Eating:  -Group instruction provided by PowerPoint slides, verbal discussion, and written materials to support subject matter. The  instructor gives an explanation and review of the Therapeutic Lifestyle Changes diet recommendations, which includes a discussion on lipid goals, dietary fat, sodium, fiber, plant stanol/sterol esters, sugar, and the components of a well-balanced, healthy diet.   Nutrition II class: Lifestyle Skills:  -Group instruction provided by PowerPoint slides, verbal discussion, and written materials to support subject matter. The instructor gives an explanation and review of label reading, grocery shopping for heart health, heart healthy recipe modifications, and ways to make healthier choices when eating out.   Diabetes Question & Answer:  -Group instruction provided by PowerPoint slides, verbal discussion, and written materials to support subject matter. The instructor gives an explanation and review of diabetes co-morbidities, pre- and post-prandial blood glucose goals, pre-exercise blood glucose goals, signs, symptoms, and treatment of hypoglycemia and hyperglycemia, and foot care basics.   Diabetes Blitz:  -Group instruction  provided by PowerPoint slides, verbal discussion, and written materials to support subject matter. The instructor gives an explanation and review of the physiology behind type 1 and type 2 diabetes, diabetes medications and rational behind using different medications, pre- and post-prandial blood glucose recommendations and Hemoglobin A1c goals, diabetes diet, and exercise including blood glucose guidelines for exercising safely.    Portion Distortion:  -Group instruction provided by PowerPoint slides, verbal discussion, written materials, and food models to support subject matter. The instructor gives an explanation of serving size versus portion size, changes in portions sizes over the last 20 years, and what consists of a serving from each food group.   Stress Management:  -Group instruction provided by verbal instruction, video, and written materials to support subject  matter.  Instructors review role of stress in heart disease and how to cope with stress positively.     Exercising on Your Own:  -Group instruction provided by verbal instruction, power point, and written materials to support subject.  Instructors discuss benefits of exercise, components of exercise, frequency and intensity of exercise, and end points for exercise.  Also discuss use of nitroglycerin and activating EMS.  Review options of places to exercise outside of rehab.  Review guidelines for sex with heart disease.   Cardiac Drugs I:  -Group instruction provided by verbal instruction and written materials to support subject.  Instructor reviews cardiac drug classes: antiplatelets, anticoagulants, beta blockers, and statins.  Instructor discusses reasons, side effects, and lifestyle considerations for each drug class.   Cardiac Drugs II:  -Group instruction provided by verbal instruction and written materials to support subject.  Instructor reviews cardiac drug classes: angiotensin converting enzyme inhibitors (ACE-I), angiotensin II receptor blockers (ARBs), nitrates, and calcium channel blockers.  Instructor discusses reasons, side effects, and lifestyle considerations for each drug class.   Anatomy and Physiology of the Circulatory System:  Group verbal and written instruction and models provide basic cardiac anatomy and physiology, with the coronary electrical and arterial systems. Review of: AMI, Angina, Valve disease, Heart Failure, Peripheral Artery Disease, Cardiac Arrhythmia, Pacemakers, and the ICD.   Other Education:  -Group or individual verbal, written, or video instructions that support the educational goals of the cardiac rehab program.   Knowledge Questionnaire Score:     Knowledge Questionnaire Score - 07/12/17 1412      Knowledge Questionnaire Score   Pre Score 22/24      Core Components/Risk Factors/Patient Goals at Admission:     Personal Goals and Risk  Factors at Admission - 07/12/17 1433      Core Components/Risk Factors/Patient Goals on Admission    Weight Management Weight Loss;Yes   Intervention Obesity: Provide education and appropriate resources to help participant work on and attain dietary goals.;Weight Management/Obesity: Establish reasonable short term and long term weight goals.;Weight Management: Provide education and appropriate resources to help participant work on and attain dietary goals.;Weight Management: Develop a combined nutrition and exercise program designed to reach desired caloric intake, while maintaining appropriate intake of nutrient and fiber, sodium and fats, and appropriate energy expenditure required for the weight goal.   Admit Weight 181 lb (82.1 kg)   Expected Outcomes Weight Loss: Understanding of general recommendations for a balanced deficit meal plan, which promotes 1-2 lb weight loss per week and includes a negative energy balance of [phone] kcal/d;Long Term: Adherence to nutrition and physical activity/exercise program aimed toward attainment of established weight goal;Short Term: Continue to assess and modify interventions until short term weight is achieved  Diabetes Yes   Intervention Provide education about signs/symptoms and action to take for hypo/hyperglycemia.;Provide education about proper nutrition, including hydration, and aerobic/resistive exercise prescription along with prescribed medications to achieve blood glucose in normal ranges: Fasting glucose 65-99 mg/dL   Expected Outcomes Short Term: Participant verbalizes understanding of the signs/symptoms and immediate care of hyper/hypoglycemia, proper foot care and importance of medication, aerobic/resistive exercise and nutrition plan for blood glucose control.;Long Term: Attainment of HbA1C < 7%.   Hypertension Yes   Intervention Provide education on lifestyle modifcations including regular physical activity/exercise, weight management, moderate  sodium restriction and increased consumption of fresh fruit, vegetables, and low fat dairy, alcohol moderation, and smoking cessation.;Monitor prescription use compliance.   Expected Outcomes Short Term: Continued assessment and intervention until BP is < 140/49m HG in hypertensive participants. < 130/879mHG in hypertensive participants with diabetes, heart failure or chronic kidney disease.;Long Term: Maintenance of blood pressure at goal levels.   Lipids Yes   Intervention Provide education and support for participant on nutrition & aerobic/resistive exercise along with prescribed medications to achieve LDL <7064mHDL >40m56m Expected Outcomes Short Term: Participant states understanding of desired cholesterol values and is compliant with medications prescribed. Participant is following exercise prescription and nutrition guidelines.;Long Term: Cholesterol controlled with medications as prescribed, with individualized exercise RX and with personalized nutrition plan. Value goals: LDL < 70mg85mL > 40 mg.      Core Components/Risk Factors/Patient Goals Review:    Core Components/Risk Factors/Patient Goals at Discharge (Final Review):    ITP Comments:     ITP Comments    Row Name 07/12/17 1402           ITP Comments Medical Director- Dr. TraciFransico Him          Comments: Patient attended orientation from 1359  to 1525  to review rules and guidelines for program. Completed 6 minute walk test, Intitial ITP, and exercise prescription.  VSS. Telemetry-sinus rhythm, IBBB,   Asymptomatic.

## 2017-07-13 NOTE — Progress Notes (Addendum)
Jocelyn [name] 61 y.o. female DOB: [birth year] MRN: [id number]      Nutrition Note  1. 06/15/17 ST elevation myocardial infarction (STEMI), unspecified artery ([place])   2. 06/15/17 Status post coronary artery stent placement    Past Medical History:  Diagnosis Date  . Acute bronchitis   . Allergic rhinitis, cause unspecified   . Anemia   . Anxiety   . B12 deficiency   . BV (bacterial vaginosis) 06/22/1996  . Calculus of kidney   . Calculus of kidney   . Coronary atherosclerosis of unspecified type of vessel, native or graft   . Dizziness   . Essential hypertension, benign   . Fibroid 2003  . Fibromyalgia   . H/O dysmenorrhea 2008  . H/O varicella   . Headache(784.0)    frequently  . HSV-2 infection 2009  . Hyperplastic colon polyp 05/16/2014  . Irritable bowel syndrome   . Meniere's disease, unspecified   . Menses, irregular 2003  . Myalgia and myositis, unspecified   . Obstructive sleep apnea (adult) (pediatric)   . Perimenopausal symptoms 2003  . Pure hypercholesterolemia   . Sarcoidosis   . Type II or unspecified type diabetes mellitus without mention of complication, not stated as uncontrolled   . Unspecified venous (peripheral) insufficiency   . Vitamin D deficiency   . Vulvitis 2010  . Yeast infection    Meds reviewed. Jentadueto XR (Linagliptin/Metformin) noted.   HT: Ht Readings from Last 1 Encounters:  07/12/17 5' 2.5" (1.588 m)    WT: Wt Readings from Last 3 Encounters:  07/12/17 181 lb (82.1 kg)  07/08/17 179 lb 3.2 oz (81.3 kg)  06/27/17 171 lb 6.4 oz (77.7 kg)     BMI 32.6   Current tobacco use? No  Labs:  Lipid Panel     Component Value Date/Time   CHOL 182 06/15/2017 1205   TRIG 163 (H) 06/15/2017 1205   HDL 56 06/15/2017 1205   CHOLHDL 3.3 06/15/2017 1205   VLDL 33 06/15/2017 1205   LDLCALC 93 06/15/2017 1205   LDLDIRECT 91.6 08/14/2014 0822    Lab Results  Component Value Date   HGBA1C 6.7 (H) 09/08/2011   CBG (last 3)  No results for  input(s): GLUCAP in the last 72 hours.  Nutrition Note Spoke with pt. Nutrition plan and goals reviewed with pt. Pt is following Step 1 of the Therapeutic Lifestyle Changes diet. Pt wants to lose wt.  Wt loss tips reviewed.  Pt is diabetic. Last A1c indicates blood glucose well-controlled. Pt checks fasting CBG's once daily; CBG this morning reportedly 110 mg/dL. Pt expressed understanding of the information reviewed. Pt aware of nutrition education classes offered and plans on attending nutrition classes.  Nutrition Diagnosis ? Food-and nutrition-related knowledge deficit related to lack of exposure to information as related to diagnosis of: ? CVD ? DM  ? Obesity related to excessive energy intake as evidenced by a BMI of 32.6  Nutrition Intervention ? Pt's individual nutrition plan and goals reviewed with pt.  Nutrition Goal(s):  ? Pt to identify and limit food sources of saturated fat, trans fat, and sodium ? Pt to identify food quantities necessary to achieve weight loss of 6-24 lb (2.7-10.9 kg) at graduation from cardiac rehab.  Plan:  Pt to attend nutrition classes ? Nutrition I ? Nutrition II ? Portion Distortion ? Diabetes Blitz ? Diabetes Q & A Will provide client-centered nutrition education as part of interdisciplinary care.   Monitor and evaluate progress toward nutrition  goal with team.  Derek Mound, M.Ed, RD, LDN, CDE 07/13/2017 11:11 AM

## 2017-07-20 ENCOUNTER — Encounter (HOSPITAL_COMMUNITY)
Admission: RE | Admit: 2017-07-20 | Discharge: 2017-07-20 | Disposition: A | Discharge status: Home / Self Care | Payer: BC Managed Care – PPO | Source: Ambulatory Visit | Attending: Interventional Cardiology | Admitting: Interventional Cardiology

## 2017-07-20 DIAGNOSIS — I2111 ST elevation (STEMI) myocardial infarction involving right coronary artery: Secondary | ICD-10-CM | POA: Insufficient documentation

## 2017-07-20 DIAGNOSIS — Z48812 Encounter for surgical aftercare following surgery on the circulatory system: Secondary | ICD-10-CM | POA: Insufficient documentation

## 2017-07-20 DIAGNOSIS — Z955 Presence of coronary angioplasty implant and graft: Secondary | ICD-10-CM | POA: Insufficient documentation

## 2017-07-20 DIAGNOSIS — I213 ST elevation (STEMI) myocardial infarction of unspecified site: Secondary | ICD-10-CM

## 2017-07-20 LAB — GLUCOSE, CAPILLARY
Glucose-Capillary: 111 mg/dL — ABNORMAL HIGH (ref 65–99)
Glucose-Capillary: 118 mg/dL — ABNORMAL HIGH (ref 65–99)

## 2017-07-20 NOTE — Progress Notes (Signed)
Daily Session Note  Patient Details  Name: Jocelyn [name] MRN: [id number] Date of Birth: 1956-06-29 Referring Provider:     CARDIAC REHAB PHASE II ORIENTATION from 07/12/2017 in [place]  Referring Provider  Thamas Jaegers MD      Encounter Date: 07/20/2017  Check In:     Session Check In - 07/20/17 1349      Check-In   Location [facility]   Staff Present Dorna Bloom, MS, ACSM RCEP, Exercise Physiologist;Carlette Wilber Oliphant, RN, Deland Pretty, MS, ACSM CEP, Exercise Physiologist;Maria Whitaker, RN, BSN   Supervising physician immediately available to respond to emergencies [facility] immediately available   Physician(s) Dr. Bonner Puna   Medication changes reported     No   Fall or balance concerns reported    No   Tobacco Cessation No Change   Warm-up and Cool-down Performed as group-led instruction   Resistance Training Performed No   VAD Patient? No     Pain Assessment   Currently in Pain? No/denies   Multiple Pain Sites No      Capillary Blood Glucose: Results for orders placed or performed during the hospital encounter of 07/20/17 (from the past 24 hour(s))  Glucose, capillary     Status: Abnormal   Collection Time: 07/20/17  1:34 PM  Result Value Ref Range   Glucose-Capillary 111 (H) 65 - 99 mg/dL  Glucose, capillary     Status: Abnormal   Collection Time: 07/20/17  2:15 PM  Result Value Ref Range   Glucose-Capillary 118 (H) 65 - 99 mg/dL      History  Smoking Status  . Never Smoker  Smokeless Tobacco  . Never Used    Comment: Daily Caffeine - 1  Exercise 2-3 times/weekly    Goals Met:  No report of cardiac concerns or symptoms  Goals Unmet:  Not Applicable  Comments: Eiko started cardiac rehab today.  Pt tolerated light exercise without difficulty. VSS, telemetry-Sinus Rhythm,t wave inversion asymptomatic.  Medication list reconciled. Pt denies barriers to medicaiton compliance.   PSYCHOSOCIAL ASSESSMENT:  PHQ-0. Pt exhibits positive coping skills, hopeful outlook with supportive family. No psychosocial needs identified at this time, no psychosocial interventions necessary.    Pt enjoys spending time with her family.   Pt oriented to exercise equipment and routine.    Understanding verbalized.Barnet Pall, RN,BSN 07/20/2017 4:41 PM   Dr. Fransico Him is Medical Director for Cardiac Rehab at [facility].

## 2017-07-22 ENCOUNTER — Encounter (HOSPITAL_COMMUNITY)
Admission: RE | Admit: 2017-07-22 | Discharge: 2017-07-22 | Disposition: A | Discharge status: Home / Self Care | Payer: BC Managed Care – PPO | Source: Ambulatory Visit | Attending: Interventional Cardiology | Admitting: Interventional Cardiology

## 2017-07-22 ENCOUNTER — Telehealth: Payer: Self-pay | Admitting: *Deleted

## 2017-07-22 DIAGNOSIS — I213 ST elevation (STEMI) myocardial infarction of unspecified site: Secondary | ICD-10-CM

## 2017-07-22 DIAGNOSIS — Z955 Presence of coronary angioplasty implant and graft: Secondary | ICD-10-CM | POA: Diagnosis not present

## 2017-07-22 LAB — GLUCOSE, CAPILLARY
GLUCOSE-CAPILLARY: 115 mg/dL — AB (ref 65–99)
Glucose-Capillary: 119 mg/dL — ABNORMAL HIGH (ref 65–99)

## 2017-07-22 NOTE — Telephone Encounter (Signed)
I tried to call patient and e-mail her about having her [employer]-Gene blood re-drawn.The original sample was not accepted by lab because of error with labeling.Patient did not respond to my calls or e-mail. I will make patient a screen failure.

## 2017-07-25 ENCOUNTER — Encounter (HOSPITAL_COMMUNITY)
Admission: RE | Admit: 2017-07-25 | Discharge: 2017-07-25 | Disposition: A | Discharge status: Home / Self Care | Payer: BC Managed Care – PPO | Source: Ambulatory Visit | Attending: Interventional Cardiology | Admitting: Interventional Cardiology

## 2017-07-25 DIAGNOSIS — Z955 Presence of coronary angioplasty implant and graft: Secondary | ICD-10-CM | POA: Diagnosis not present

## 2017-07-25 DIAGNOSIS — I213 ST elevation (STEMI) myocardial infarction of unspecified site: Secondary | ICD-10-CM

## 2017-07-25 LAB — GLUCOSE, CAPILLARY: GLUCOSE-CAPILLARY: 167 mg/dL — AB (ref 65–99)

## 2017-07-27 ENCOUNTER — Encounter (HOSPITAL_COMMUNITY)
Admission: RE | Admit: 2017-07-27 | Discharge: 2017-07-27 | Disposition: A | Discharge status: Home / Self Care | Payer: BC Managed Care – PPO | Source: Ambulatory Visit | Attending: Interventional Cardiology | Admitting: Interventional Cardiology

## 2017-07-27 DIAGNOSIS — Z955 Presence of coronary angioplasty implant and graft: Secondary | ICD-10-CM | POA: Diagnosis not present

## 2017-07-27 DIAGNOSIS — I213 ST elevation (STEMI) myocardial infarction of unspecified site: Secondary | ICD-10-CM

## 2017-07-27 LAB — GLUCOSE, CAPILLARY: Glucose-Capillary: 131 mg/dL — ABNORMAL HIGH (ref 65–99)

## 2017-07-27 NOTE — Progress Notes (Signed)
Cardiac Individual Treatment Plan  Patient Details  Name: Jocelyn [name] MRN: [id number] Date of Birth: 1956-10-14 Referring Provider:     CARDIAC REHAB PHASE II ORIENTATION from 07/12/2017 in [place]  Referring Provider  Thamas Jaegers MD      Initial Encounter Date:    CARDIAC REHAB PHASE II ORIENTATION from 07/12/2017 in [place]  Date  07/12/17  Referring Provider  Thamas Jaegers MD      Visit Diagnosis: 06/15/17 Status post coronary artery stent placement  06/15/17 ST elevation myocardial infarction (STEMI), unspecified artery ([place])  Patient's Home Medications on Admission:  Current Outpatient Prescriptions:  .  albuterol (PROVENTIL HFA;VENTOLIN HFA) 108 (90 BASE) MCG/ACT inhaler, Inhale 1-2 puffs into the lungs every 6 (six) hours as needed for wheezing or shortness of breath., Disp: 8 g, Rfl: 2 .  ALPRAZolam (XANAX) 0.5 MG tablet, Take 0.25 mg by mouth 2 (two) times daily. Take 1 tablet by mouth daily at bedtime, Disp: , Rfl:  .  aspirin 81 MG chewable tablet, Chew 1 tablet (81 mg total) by mouth daily., Disp: , Rfl:  .  CARTIA XT 240 MG 24 hr capsule, take 1 capsule by mouth once daily, Disp: 90 capsule, Rfl: 3 .  Cyanocobalamin 1000 MCG/ML KIT, Inject 1,000 mg as directed every 30 (thirty) days., Disp: , Rfl:  .  Evolocumab (REPATHA SURECLICK) 103 MG/ML SOAJ, Inject 1 pen into the skin every 14 (fourteen) days., Disp: 2 pen, Rfl: 11 .  fluticasone (FLONASE) 50 MCG/ACT nasal spray, Place 2 sprays into both nostrils at bedtime., Disp: 16 g, Rfl: 2 .  furosemide (LASIX) 20 MG tablet, Take 20 mg by mouth daily as needed for fluid or edema., Disp: , Rfl:  .  HP ACTHAR 80 UNIT/ML injectable gel, Inject 80 Units into the skin 3 (three) times a week. , Disp: , Rfl:  .  JENTADUETO XR 2.03-999 MG TB24, Take 1 tablet by mouth daily., Disp: , Rfl: 0 .  losartan (COZAAR) 100 MG tablet, Take 1 tablet (100 mg total) by  mouth daily., Disp: 30 tablet, Rfl: 0 .  metoprolol tartrate (LOPRESSOR) 50 MG tablet, Take 1 tablet (50 mg total) by mouth 2 (two) times daily., Disp: 60 tablet, Rfl: 5 .  mometasone-formoterol (DULERA) 100-5 MCG/ACT AERO, Inhale 2 puffs into the lungs 2 (two) times daily., Disp: , Rfl:  .  nitroGLYCERIN (NITROSTAT) 0.4 MG SL tablet, Place 1 tablet (0.4 mg total) under the tongue every 5 (five) minutes as needed for chest pain., Disp: 25 tablet, Rfl: 2 .  ondansetron (ZOFRAN-ODT) 4 MG disintegrating tablet, Take 4 mg by mouth every 8 (eight) hours as needed for nausea or vomiting., Disp: , Rfl:  .  pantoprazole (PROTONIX) 40 MG tablet, Take 1 tablet (40 mg total) by mouth daily., Disp: 30 tablet, Rfl: 11 .  PREMPRO 0.3-1.5 MG tablet, Take 1 tablet by mouth daily., Disp: , Rfl: 0 .  ticagrelor (BRILINTA) 90 MG TABS tablet, Take 1 tablet (90 mg total) by mouth 2 (two) times daily., Disp: 180 tablet, Rfl: 3 .  traMADol (ULTRAM) 50 MG tablet, Take 1 tablet (50 mg total) by mouth every 6 (six) hours as needed for severe pain., Disp: 30 tablet, Rfl: 0 .  TRINTELLIX 10 MG TABS, Take 1 tablet by mouth daily., Disp: , Rfl: 0 .  valACYclovir (VALTREX) 500 MG tablet, Take 500 mg by mouth daily. , Disp: , Rfl:   Past Medical  History: Past Medical History:  Diagnosis Date  . Acute bronchitis   . Allergic rhinitis, cause unspecified   . Anemia   . Anxiety   . B12 deficiency   . BV (bacterial vaginosis) 06/22/1996  . Calculus of kidney   . Calculus of kidney   . Coronary atherosclerosis of unspecified type of vessel, native or graft   . Dizziness   . Essential hypertension, benign   . Fibroid 2003  . Fibromyalgia   . H/O dysmenorrhea 2008  . H/O varicella   . Headache(784.0)    frequently  . HSV-2 infection 2009  . Hyperplastic colon polyp 05/16/2014  . Irritable bowel syndrome   . Meniere's disease, unspecified   . Menses, irregular 2003  . Myalgia and myositis, unspecified   . Obstructive  sleep apnea (adult) (pediatric)   . Perimenopausal symptoms 2003  . Pure hypercholesterolemia   . Sarcoidosis   . Type II or unspecified type diabetes mellitus without mention of complication, not stated as uncontrolled   . Unspecified venous (peripheral) insufficiency   . Vitamin D deficiency   . Vulvitis 2010  . Yeast infection     Tobacco Use: History  Smoking Status  . Never Smoker  Smokeless Tobacco  . Never Used    Comment: Daily Caffeine - 1  Exercise 2-3 times/weekly    Labs: Recent Review Flowsheet Data    Labs for ITP Cardiac and Pulmonary Rehab Latest Ref Rng & Units 08/14/2014 01/27/2015 06/01/2016 08/18/2016 06/15/2017   Cholestrol 0 - 200 mg/dL 167 181 296(H) 191 182   LDLCALC 0 - 99 mg/dL - 104(H) 202(H) 107 93   LDLDIRECT mg/dL 91.6 - - - -   HDL >40 mg/dL 46.90 44.40 53 41(L) 56   Trlycerides <150 mg/dL 215.0(H) 162.0(H) 205(H) 214(H) 163(H)   Hemoglobin A1c 4.6 - 6.5 % - - - - -   TCO2 0 - 100 mmol/L - - - - 22      Capillary Blood Glucose: Lab Results  Component Value Date   GLUCAP 131 (H) 07/27/2017   GLUCAP 167 (H) 07/25/2017   GLUCAP 115 (H) 07/22/2017   GLUCAP 119 (H) 07/22/2017   GLUCAP 118 (H) 07/20/2017     Exercise Target Goals:    Exercise Program Goal: Individual exercise prescription set with THRR, safety & activity barriers. Participant demonstrates ability to understand and report RPE using BORG scale, to self-measure pulse accurately, and to acknowledge the importance of the exercise prescription.  Exercise Prescription Goal: Starting with aerobic activity 30 plus minutes a day, 3 days per week for initial exercise prescription. Provide home exercise prescription and guidelines that participant acknowledges understanding prior to discharge.  Activity Barriers & Risk Stratification:     Activity Barriers & Cardiac Risk Stratification - 07/12/17 1452      Activity Barriers & Cardiac Risk Stratification   Activity Barriers Back  Problems;Balance Concerns;Other (comment)   Comments B knee pain R>L   Cardiac Risk Stratification High      6 Minute Walk:     6 Minute Walk    Row Name 07/12/17 1429 07/12/17 1506 07/12/17 1513     6 Minute Walk   Phase Initial  -  -   Distance 1246 feet  -  -   Walk Time 6 minutes  -  -   # of Rest Breaks 0  -  -   MPH 2.4  -  -   METS 2.8  -  -  RPE 12  -  -   VO2 Peak 9.8  -  -   Symptoms  -  - Yes (comment)   Comments  -  - fatigue at end of walk test   Resting HR 89 bpm  -  -   Resting BP 110/78  -  -   Resting Oxygen Saturation  98 %  -  -   Exercise Oxygen Saturation  during 6 min walk 97 %  -  -   Max Ex. HR 101 bpm  -  -   Max Ex. BP 118/70  -  -   2 Minute Post BP  - 100/70  -      Oxygen Initial Assessment:   Oxygen Re-Evaluation:   Oxygen Discharge (Final Oxygen Re-Evaluation):   Initial Exercise Prescription:     Initial Exercise Prescription - 07/12/17 1500      Date of Initial Exercise RX and Referring Provider   Date 07/12/17   Referring Provider Thamas Jaegers MD     Treadmill   MPH 2.2   Grade 0   Minutes 10   METs 2.68     Recumbant Bike   Level 2   Watts 10   Minutes 10   METs 2.38     NuStep   Level 2   SPM 70   Minutes 10   METs 2     Track   Laps 8   Minutes 10   METs 2.39     Prescription Details   Frequency (times per week) 3   Duration Progress to 30 minutes of continuous aerobic without signs/symptoms of physical distress     Intensity   THRR 40-80% of Max Heartrate 63-126   Ratings of Perceived Exertion 11-13   Perceived Dyspnea 0-4     Progression   Progression Continue to progress workloads to maintain intensity without signs/symptoms of physical distress.     Resistance Training   Training Prescription Yes   Weight 2lbs   Reps 10-15      Perform Capillary Blood Glucose checks as needed.  Exercise Prescription Changes:     Exercise Prescription Changes    Row Name 07/20/17 1704  07/25/17 1700           Response to Exercise   Blood Pressure (Admit) 122/76 116/70      Blood Pressure (Exercise) 140/82 140/70      Blood Pressure (Exit) 122/80 110/70      Heart Rate (Admit) 72 bpm 88 bpm      Heart Rate (Exercise) 91 bpm 110 bpm      Heart Rate (Exit) 72 bpm 82 bpm      Rating of Perceived Exertion (Exercise) 12 12      Symptoms none c/o lightheadedness after walking on treadmill      Comments pt was oriented to exercise equipment discussed slowing down speed vs completely stopping on TM      Duration Continue with 30 min of aerobic exercise without signs/symptoms of physical distress. Continue with 30 min of aerobic exercise without signs/symptoms of physical distress.      Intensity THRR unchanged THRR unchanged        Progression   Progression Continue to progress workloads to maintain intensity without signs/symptoms of physical distress. Continue to progress workloads to maintain intensity without signs/symptoms of physical distress.      Average METs 1.6 2.3        Resistance Training   Training Prescription Yes  Yes      Weight 2lbs 2lbs      Reps 10-15 10-15      Time 10 Minutes 10 Minutes        Treadmill   MPH  - 2.2      Grade  - 0      Minutes  - 15      METs  - 2.68        NuStep   Level 2 2      SPM 60 70      Minutes 30 15      METs 1.6 1.8         Exercise Comments:     Exercise Comments    Row Name 07/26/17 1435           Exercise Comments Pt has completed 3 exercise sessions in cardiac, to soon to evaluate goals but pt is making great progress.          Exercise Goals and Review:     Exercise Goals    Row Name 07/12/17 1507 07/12/17 1508           Exercise Goals   Increase Physical Activity Yes  -      Intervention Provide advice, education, support and counseling about physical activity/exercise needs.;Develop an individualized exercise prescription for aerobic and resistive training based on initial evaluation  findings, risk stratification, comorbidities and participant's personal goals.  -      Expected Outcomes Achievement of increased cardiorespiratory fitness and enhanced flexibility, muscular endurance and strength shown through measurements of functional capacity and personal statement of participant.  -      Increase Strength and Stamina Yes  -      Intervention Provide advice, education, support and counseling about physical activity/exercise needs.;Develop an individualized exercise prescription for aerobic and resistive training based on initial evaluation findings, risk stratification, comorbidities and participant's personal goals.  -      Expected Outcomes Achievement of increased cardiorespiratory fitness and enhanced flexibility, muscular endurance and strength shown through measurements of functional capacity and personal statement of participant.  -      Able to understand and use rate of perceived exertion (RPE) scale  - Yes      Intervention  - Provide education and explanation on how to use RPE scale      Expected Outcomes  - Long Term:  Able to use RPE to guide intensity level when exercising independently;Short Term: Able to use RPE daily in rehab to express subjective intensity level      Knowledge and understanding of Target Heart Rate Range (THRR)  - Yes      Intervention  - Provide education and explanation of THRR including how the numbers were predicted and where they are located for reference      Expected Outcomes  - Short Term: Able to state/look up THRR;Short Term: Able to use daily as guideline for intensity in rehab;Long Term: Able to use THRR to govern intensity when exercising independently      Able to check pulse independently  - Yes      Understanding of Exercise Prescription  - Yes      Intervention  - Provide education, explanation, and written materials on patient's individual exercise prescription      Expected Outcomes  - Long Term: Able to explain home exercise  prescription to exercise independently;Short Term: Able to explain program exercise prescription         Exercise Goals Re-Evaluation :  Exercise Goals Re-Evaluation    [place] Name 07/26/17 1436             Exercise Goal Re-Evaluation   Exercise Goals Review Increase Physical Activity;Able to understand and use rate of perceived exertion (RPE) scale;Knowledge and understanding of Target Heart Rate Range (THRR);Understanding of Exercise Prescription;Increase Strength and Stamina       Comments Pt is exercising for 30 minutes and is doing well. Pt did report dizziness after treadmill. Pt abruptly stopped machine, express to pt to slow speed down and come to a stop slowly to avoid abrupt stop and dizziness.       Expected Outcomes Pt will be able to use treadmill safely and feel more confident with using treadmill machine or change to walking track.            Discharge Exercise Prescription (Final Exercise Prescription Changes):     Exercise Prescription Changes - 07/25/17 1700      Response to Exercise   Blood Pressure (Admit) 116/70   Blood Pressure (Exercise) 140/70   Blood Pressure (Exit) 110/70   Heart Rate (Admit) 88 bpm   Heart Rate (Exercise) 110 bpm   Heart Rate (Exit) 82 bpm   Rating of Perceived Exertion (Exercise) 12   Symptoms c/o lightheadedness after walking on treadmill   Comments discussed slowing down speed vs completely stopping on TM   Duration Continue with 30 min of aerobic exercise without signs/symptoms of physical distress.   Intensity THRR unchanged     Progression   Progression Continue to progress workloads to maintain intensity without signs/symptoms of physical distress.   Average METs 2.3     Resistance Training   Training Prescription Yes   Weight 2lbs   Reps 10-15   Time 10 Minutes     Treadmill   MPH 2.2   Grade 0   Minutes 15   METs 2.68     NuStep   Level 2   SPM 70   Minutes 15   METs 1.8      Nutrition:  Target  Goals: Understanding of nutrition guidelines, daily intake of sodium <1566m, cholesterol <2069m calories 30% from fat and 7% or less from saturated fats, daily to have 5 or more servings of fruits and vegetables.  Biometrics:     Pre Biometrics - 07/12/17 1512      Pre Biometrics   % Body Fat 32.2 %   Flexibility 0 in       Nutrition Therapy Plan and Nutrition Goals:     Nutrition Therapy & Goals - 07/13/17 1118      Nutrition Therapy   Diet Carb Modified, Therapeutic Lifestyle Changes     Personal Nutrition Goals   Nutrition Goal Pt to identify and limit food sources of saturated fat, trans fat, and sodium   Personal Goal #2 Pt to identify food quantities necessary to achieve weight loss of 6-24 lb (2.7-10.9 kg) at graduation from cardiac rehab.      Intervention Plan   Intervention Prescribe, educate and counsel regarding individualized specific dietary modifications aiming towards targeted core components such as weight, hypertension, lipid management, diabetes, heart failure and other comorbidities.   Expected Outcomes Short Term Goal: Understand basic principles of dietary content, such as calories, fat, sodium, cholesterol and nutrients.;Long Term Goal: Adherence to prescribed nutrition plan.      Nutrition Discharge: Nutrition Scores:     Nutrition Assessments - 07/13/17 1118      MEDFICTS Scores   Pre  Score 44      Nutrition Goals Re-Evaluation:   Nutrition Goals Re-Evaluation:   Nutrition Goals Discharge (Final Nutrition Goals Re-Evaluation):   Psychosocial: Target Goals: Acknowledge presence or absence of significant depression and/or stress, maximize coping skills, provide positive support system. Participant is able to verbalize types and ability to use techniques and skills needed for reducing stress and depression.  Initial Review & Psychosocial Screening:     Initial Psych Review & Screening - 07/12/17 1556      Initial Review   Current  issues with None Identified     Family Dynamics   Good Support System? Yes  family    Comments upon brief assessment, no psychosocial needs identified, no interventions necessary      Barriers   Psychosocial barriers to participate in program There are no identifiable barriers or psychosocial needs.     Screening Interventions   Interventions Encouraged to exercise      Quality of Life Scores:     Quality of Life - 07/12/17 1412      Quality of Life Scores   Health/Function Pre 19.85 %   Socioeconomic Pre 24.75 %   Psych/Spiritual Pre 23.93 %   Family Pre 28.5 %   GLOBAL Pre 22.93 %      PHQ-9: Recent Review Flowsheet Data    Depression screen [facility] 2/9 07/20/2017 12/03/2014   Decreased Interest 0 0   Down, Depressed, Hopeless 0 0   PHQ - 2 Score 0 0     Interpretation of Total Score  Total Score Depression Severity:  1-4 = Minimal depression, 5-9 = Mild depression, 10-14 = Moderate depression, 15-19 = Moderately severe depression, 20-27 = Severe depression   Psychosocial Evaluation and Intervention:     Psychosocial Evaluation - 07/27/17 1802      Psychosocial Evaluation & Interventions   Interventions Encouraged to exercise with the program and follow exercise prescription   Comments no psychhosocial needs identified, no interventions necessary    Expected Outcomes pt will exhibit positive outlook with good coping skills.    Continue Psychosocial Services  No Follow up required      Psychosocial Re-Evaluation:     Psychosocial Re-Evaluation    [place] Name 07/27/17 1803             Psychosocial Re-Evaluation   Current issues with None Identified       Comments no psychosocial needs identified, no interventions necessary        Expected Outcomes pt will exhibit positive outlook wtih good coping skills.        Interventions Encouraged to attend Cardiac Rehabilitation for the exercise;Stress management education;Relaxation education       Continue  Psychosocial Services  No Follow up required          Psychosocial Discharge (Final Psychosocial Re-Evaluation):     Psychosocial Re-Evaluation - 07/27/17 1803      Psychosocial Re-Evaluation   Current issues with None Identified   Comments no psychosocial needs identified, no interventions necessary    Expected Outcomes pt will exhibit positive outlook wtih good coping skills.    Interventions Encouraged to attend Cardiac Rehabilitation for the exercise;Stress management education;Relaxation education   Continue Psychosocial Services  No Follow up required      Vocational Rehabilitation: Provide vocational rehab assistance to qualifying candidates.   Vocational Rehab Evaluation & Intervention:   Education: Education Goals: Education classes will be provided on a weekly basis, covering required topics. Participant will state understanding/return demonstration  of topics presented.  Learning Barriers/Preferences:     Learning Barriers/Preferences - 07/12/17 1452      Learning Barriers/Preferences   Learning Barriers Sight   Learning Preferences Pictoral;Video      Education Topics: Count Your Pulse:  -Group instruction provided by verbal instruction, demonstration, patient participation and written materials to support subject.  Instructors address importance of being able to find your pulse and how to count your pulse when at home without a heart monitor.  Patients get hands on experience counting their pulse with staff help and individually.   CARDIAC REHAB PHASE II EXERCISE from 07/27/2017 in [place]  Date  07/22/17  Instruction Review Code  2- meets goals/outcomes      Heart Attack, Angina, and Risk Factor Modification:  -Group instruction provided by verbal instruction, video, and written materials to support subject.  Instructors address signs and symptoms of angina and heart attacks.    Also discuss risk factors for heart disease  and how to make changes to improve heart health risk factors.   Functional Fitness:  -Group instruction provided by verbal instruction, demonstration, patient participation, and written materials to support subject.  Instructors address safety measures for doing things around the house.  Discuss how to get up and down off the floor, how to pick things up properly, how to safely get out of a chair without assistance, and balance training.   Meditation and Mindfulness:  -Group instruction provided by verbal instruction, patient participation, and written materials to support subject.  Instructor addresses importance of mindfulness and meditation practice to help reduce stress and improve awareness.  Instructor also leads participants through a meditation exercise.    Stretching for Flexibility and Mobility:  -Group instruction provided by verbal instruction, patient participation, and written materials to support subject.  Instructors lead participants through series of stretches that are designed to increase flexibility thus improving mobility.  These stretches are additional exercise for major muscle groups that are typically performed during regular warm up and cool down.   Hands Only CPR:  -Group verbal, video, and participation provides a basic overview of AHA guidelines for community CPR. Role-play of emergencies allow participants the opportunity to practice calling for help and chest compression technique with discussion of AED use.   Hypertension: -Group verbal and written instruction that provides a basic overview of hypertension including the most recent diagnostic guidelines, risk factor reduction with self-care instructions and medication management.    Nutrition I class: Heart Healthy Eating:  -Group instruction provided by PowerPoint slides, verbal discussion, and written materials to support subject matter. The instructor gives an explanation and review of the Therapeutic  Lifestyle Changes diet recommendations, which includes a discussion on lipid goals, dietary fat, sodium, fiber, plant stanol/sterol esters, sugar, and the components of a well-balanced, healthy diet.   CARDIAC REHAB PHASE II EXERCISE from 07/27/2017 in [place]  Date  07/19/17  Educator  RD  Instruction Review Code  2- meets goals/outcomes      Nutrition II class: Lifestyle Skills:  -Group instruction provided by PowerPoint slides, verbal discussion, and written materials to support subject matter. The instructor gives an explanation and review of label reading, grocery shopping for heart health, heart healthy recipe modifications, and ways to make healthier choices when eating out.   Diabetes Question & Answer:  -Group instruction provided by PowerPoint slides, verbal discussion, and written materials to support subject matter. The instructor gives an explanation and review  of diabetes co-morbidities, pre- and post-prandial blood glucose goals, pre-exercise blood glucose goals, signs, symptoms, and treatment of hypoglycemia and hyperglycemia, and foot care basics.   Diabetes Blitz:  -Group instruction provided by PowerPoint slides, verbal discussion, and written materials to support subject matter. The instructor gives an explanation and review of the physiology behind type 1 and type 2 diabetes, diabetes medications and rational behind using different medications, pre- and post-prandial blood glucose recommendations and Hemoglobin A1c goals, diabetes diet, and exercise including blood glucose guidelines for exercising safely.    Portion Distortion:  -Group instruction provided by PowerPoint slides, verbal discussion, written materials, and food models to support subject matter. The instructor gives an explanation of serving size versus portion size, changes in portions sizes over the last 20 years, and what consists of a serving from each food  group.   Stress Management:  -Group instruction provided by verbal instruction, video, and written materials to support subject matter.  Instructors review role of stress in heart disease and how to cope with stress positively.     Exercising on Your Own:  -Group instruction provided by verbal instruction, power point, and written materials to support subject.  Instructors discuss benefits of exercise, components of exercise, frequency and intensity of exercise, and end points for exercise.  Also discuss use of nitroglycerin and activating EMS.  Review options of places to exercise outside of rehab.  Review guidelines for sex with heart disease.   CARDIAC REHAB PHASE II EXERCISE from 07/27/2017 in [place]  Date  07/20/17  Educator  EP  Instruction Review Code  2- meets goals/outcomes      Cardiac Drugs I:  -Group instruction provided by verbal instruction and written materials to support subject.  Instructor reviews cardiac drug classes: antiplatelets, anticoagulants, beta blockers, and statins.  Instructor discusses reasons, side effects, and lifestyle considerations for each drug class.   CARDIAC REHAB PHASE II EXERCISE from 07/27/2017 in [place]  Date  07/27/17  Educator  pharmacy  Instruction Review Code  2- meets goals/outcomes      Cardiac Drugs II:  -Group instruction provided by verbal instruction and written materials to support subject.  Instructor reviews cardiac drug classes: angiotensin converting enzyme inhibitors (ACE-I), angiotensin II receptor blockers (ARBs), nitrates, and calcium channel blockers.  Instructor discusses reasons, side effects, and lifestyle considerations for each drug class.   Anatomy and Physiology of the Circulatory System:  Group verbal and written instruction and models provide basic cardiac anatomy and physiology, with the coronary electrical and arterial systems. Review of: AMI,  Angina, Valve disease, Heart Failure, Peripheral Artery Disease, Cardiac Arrhythmia, Pacemakers, and the ICD.   Other Education:  -Group or individual verbal, written, or video instructions that support the educational goals of the cardiac rehab program.   Knowledge Questionnaire Score:     Knowledge Questionnaire Score - 07/12/17 1412      Knowledge Questionnaire Score   Pre Score 22/24      Core Components/Risk Factors/Patient Goals at Admission:     Personal Goals and Risk Factors at Admission - 07/12/17 1433      Core Components/Risk Factors/Patient Goals on Admission    Weight Management Weight Loss;Yes   Intervention Obesity: Provide education and appropriate resources to help participant work on and attain dietary goals.;Weight Management/Obesity: Establish reasonable short term and long term weight goals.;Weight Management: Provide education and appropriate resources to help participant work on and attain dietary goals.;Weight Management:  Develop a combined nutrition and exercise program designed to reach desired caloric intake, while maintaining appropriate intake of nutrient and fiber, sodium and fats, and appropriate energy expenditure required for the weight goal.   Admit Weight 181 lb (82.1 kg)   Expected Outcomes Weight Loss: Understanding of general recommendations for a balanced deficit meal plan, which promotes 1-2 lb weight loss per week and includes a negative energy balance of [phone] kcal/d;Long Term: Adherence to nutrition and physical activity/exercise program aimed toward attainment of established weight goal;Short Term: Continue to assess and modify interventions until short term weight is achieved   Diabetes Yes   Intervention Provide education about signs/symptoms and action to take for hypo/hyperglycemia.;Provide education about proper nutrition, including hydration, and aerobic/resistive exercise prescription along with prescribed medications to achieve blood  glucose in normal ranges: Fasting glucose 65-99 mg/dL   Expected Outcomes Short Term: Participant verbalizes understanding of the signs/symptoms and immediate care of hyper/hypoglycemia, proper foot care and importance of medication, aerobic/resistive exercise and nutrition plan for blood glucose control.;Long Term: Attainment of HbA1C < 7%.   Hypertension Yes   Intervention Provide education on lifestyle modifcations including regular physical activity/exercise, weight management, moderate sodium restriction and increased consumption of fresh fruit, vegetables, and low fat dairy, alcohol moderation, and smoking cessation.;Monitor prescription use compliance.   Expected Outcomes Short Term: Continued assessment and intervention until BP is < 140/44m HG in hypertensive participants. < 130/820mHG in hypertensive participants with diabetes, heart failure or chronic kidney disease.;Long Term: Maintenance of blood pressure at goal levels.   Lipids Yes   Intervention Provide education and support for participant on nutrition & aerobic/resistive exercise along with prescribed medications to achieve LDL <7043mHDL >60m49m Expected Outcomes Short Term: Participant states understanding of desired cholesterol values and is compliant with medications prescribed. Participant is following exercise prescription and nutrition guidelines.;Long Term: Cholesterol controlled with medications as prescribed, with individualized exercise RX and with personalized nutrition plan. Value goals: LDL < 70mg65mL > 40 mg.      Core Components/Risk Factors/Patient Goals Review:      Goals and Risk Factor Review    Row Name 07/27/17 1759             Core Components/Risk Factors/Patient Goals Review   Personal Goals Review Weight Management/Obesity;Hypertension;Diabetes;Lipids       Review pt with multiple RF demonstrates eagerness to participate in CR exercise,nutrition and education .       Expected Outcomes pt will  participate in CR exercise, nutrition and lifestyle modification to decrease overall RF.            Core Components/Risk Factors/Patient Goals at Discharge (Final Review):      Goals and Risk Factor Review - 07/27/17 1759      Core Components/Risk Factors/Patient Goals Review   Personal Goals Review Weight Management/Obesity;Hypertension;Diabetes;Lipids   Review pt with multiple RF demonstrates eagerness to participate in CR exercise,nutrition and education .   Expected Outcomes pt will participate in CR exercise, nutrition and lifestyle modification to decrease overall RF.        ITP Comments:     ITP Comments    Row Name 07/12/17 1402 07/27/17 1756         ITP Comments Medical Director- Dr. TraciFransico Him Medical Director- Dr. TraciFransico Him         Comments: Pt is making expected progress toward personal goals after completing 3 sessions. Recommend continued exercise and life style modification education including  stress management and relaxation techniques to decrease cardiac risk profile.

## 2017-07-29 ENCOUNTER — Encounter (HOSPITAL_COMMUNITY): Payer: BC Managed Care – PPO

## 2017-07-31 ENCOUNTER — Other Ambulatory Visit: Payer: Self-pay | Admitting: Interventional Cardiology

## 2017-08-01 ENCOUNTER — Encounter (HOSPITAL_COMMUNITY)
Admission: RE | Admit: 2017-08-01 | Discharge: 2017-08-01 | Disposition: A | Discharge status: Home / Self Care | Payer: BC Managed Care – PPO | Source: Ambulatory Visit | Attending: Interventional Cardiology | Admitting: Interventional Cardiology

## 2017-08-01 DIAGNOSIS — Z955 Presence of coronary angioplasty implant and graft: Secondary | ICD-10-CM | POA: Diagnosis not present

## 2017-08-01 DIAGNOSIS — I213 ST elevation (STEMI) myocardial infarction of unspecified site: Secondary | ICD-10-CM

## 2017-08-03 ENCOUNTER — Encounter (HOSPITAL_COMMUNITY): Payer: BC Managed Care – PPO

## 2017-08-05 ENCOUNTER — Encounter (HOSPITAL_COMMUNITY)
Admission: RE | Admit: 2017-08-05 | Discharge: 2017-08-05 | Disposition: A | Discharge status: Home / Self Care | Payer: BC Managed Care – PPO | Source: Ambulatory Visit | Attending: Interventional Cardiology | Admitting: Interventional Cardiology

## 2017-08-05 DIAGNOSIS — Z955 Presence of coronary angioplasty implant and graft: Secondary | ICD-10-CM

## 2017-08-05 DIAGNOSIS — I213 ST elevation (STEMI) myocardial infarction of unspecified site: Secondary | ICD-10-CM

## 2017-08-05 LAB — GLUCOSE, CAPILLARY: GLUCOSE-CAPILLARY: 146 mg/dL — AB (ref 65–99)

## 2017-08-05 NOTE — Progress Notes (Signed)
Reviewed home exercise with pt today.  Pt plans to walk or go to [facility] for exercise.  Pt plans to exercise 2x/week in addition to coming got cardiac rehab. Reviewed THR, pulse, RPE, sign and symptoms, NTG use, and when to call 911 or MD.  Also discussed weather considerations and indoor options.  Pt voiced understanding.    Jocelyn [name]

## 2017-08-08 ENCOUNTER — Encounter (HOSPITAL_COMMUNITY)
Admission: RE | Admit: 2017-08-08 | Discharge: 2017-08-08 | Disposition: A | Discharge status: Home / Self Care | Payer: BC Managed Care – PPO | Source: Ambulatory Visit | Attending: Interventional Cardiology | Admitting: Interventional Cardiology

## 2017-08-08 DIAGNOSIS — Z955 Presence of coronary angioplasty implant and graft: Secondary | ICD-10-CM | POA: Diagnosis not present

## 2017-08-08 DIAGNOSIS — I213 ST elevation (STEMI) myocardial infarction of unspecified site: Secondary | ICD-10-CM

## 2017-08-10 ENCOUNTER — Encounter (HOSPITAL_COMMUNITY)
Admission: RE | Admit: 2017-08-10 | Discharge: 2017-08-10 | Disposition: A | Discharge status: Home / Self Care | Payer: BC Managed Care – PPO | Source: Ambulatory Visit | Attending: Interventional Cardiology | Admitting: Interventional Cardiology

## 2017-08-10 DIAGNOSIS — Z955 Presence of coronary angioplasty implant and graft: Secondary | ICD-10-CM | POA: Diagnosis not present

## 2017-08-10 DIAGNOSIS — I213 ST elevation (STEMI) myocardial infarction of unspecified site: Secondary | ICD-10-CM

## 2017-08-12 ENCOUNTER — Encounter (HOSPITAL_COMMUNITY)
Admission: RE | Admit: 2017-08-12 | Discharge: 2017-08-12 | Disposition: A | Discharge status: Home / Self Care | Payer: BC Managed Care – PPO | Source: Ambulatory Visit | Attending: Interventional Cardiology | Admitting: Interventional Cardiology

## 2017-08-12 DIAGNOSIS — Z955 Presence of coronary angioplasty implant and graft: Secondary | ICD-10-CM

## 2017-08-12 DIAGNOSIS — I213 ST elevation (STEMI) myocardial infarction of unspecified site: Secondary | ICD-10-CM

## 2017-08-15 ENCOUNTER — Encounter (HOSPITAL_COMMUNITY)
Admission: RE | Admit: 2017-08-15 | Discharge: 2017-08-15 | Disposition: A | Discharge status: Home / Self Care | Payer: BC Managed Care – PPO | Source: Ambulatory Visit | Attending: Interventional Cardiology | Admitting: Interventional Cardiology

## 2017-08-15 DIAGNOSIS — Z48812 Encounter for surgical aftercare following surgery on the circulatory system: Secondary | ICD-10-CM | POA: Insufficient documentation

## 2017-08-15 DIAGNOSIS — I213 ST elevation (STEMI) myocardial infarction of unspecified site: Secondary | ICD-10-CM

## 2017-08-15 DIAGNOSIS — Z955 Presence of coronary angioplasty implant and graft: Secondary | ICD-10-CM | POA: Insufficient documentation

## 2017-08-15 DIAGNOSIS — I2111 ST elevation (STEMI) myocardial infarction involving right coronary artery: Secondary | ICD-10-CM | POA: Insufficient documentation

## 2017-08-15 LAB — GLUCOSE, CAPILLARY: GLUCOSE-CAPILLARY: 141 mg/dL — AB (ref 65–99)

## 2017-08-17 ENCOUNTER — Encounter (HOSPITAL_COMMUNITY)
Admission: RE | Admit: 2017-08-17 | Discharge: 2017-08-17 | Disposition: A | Discharge status: Home / Self Care | Payer: BC Managed Care – PPO | Source: Ambulatory Visit | Attending: Interventional Cardiology | Admitting: Interventional Cardiology

## 2017-08-17 DIAGNOSIS — Z955 Presence of coronary angioplasty implant and graft: Secondary | ICD-10-CM

## 2017-08-17 DIAGNOSIS — I213 ST elevation (STEMI) myocardial infarction of unspecified site: Secondary | ICD-10-CM

## 2017-08-19 ENCOUNTER — Encounter (HOSPITAL_COMMUNITY)
Admission: RE | Admit: 2017-08-19 | Discharge: 2017-08-19 | Disposition: A | Discharge status: Home / Self Care | Payer: BC Managed Care – PPO | Source: Ambulatory Visit | Attending: Interventional Cardiology | Admitting: Interventional Cardiology

## 2017-08-19 DIAGNOSIS — Z955 Presence of coronary angioplasty implant and graft: Secondary | ICD-10-CM | POA: Diagnosis not present

## 2017-08-19 DIAGNOSIS — I213 ST elevation (STEMI) myocardial infarction of unspecified site: Secondary | ICD-10-CM

## 2017-08-19 LAB — GLUCOSE, CAPILLARY: Glucose-Capillary: 123 mg/dL — ABNORMAL HIGH (ref 65–99)

## 2017-08-22 ENCOUNTER — Encounter (HOSPITAL_COMMUNITY)
Admission: RE | Admit: 2017-08-22 | Discharge: 2017-08-22 | Disposition: A | Discharge status: Home / Self Care | Payer: BC Managed Care – PPO | Source: Ambulatory Visit | Attending: Interventional Cardiology | Admitting: Interventional Cardiology

## 2017-08-22 ENCOUNTER — Encounter: Payer: Self-pay | Admitting: Pulmonary Disease

## 2017-08-22 ENCOUNTER — Ambulatory Visit (INDEPENDENT_AMBULATORY_CARE_PROVIDER_SITE_OTHER): Payer: BC Managed Care – PPO | Admitting: Pulmonary Disease

## 2017-08-22 VITALS — BP 126/74 | HR 62 | Temp 98.3°F | Ht 62.0 in | Wt 174.5 lb

## 2017-08-22 DIAGNOSIS — I10 Essential (primary) hypertension: Secondary | ICD-10-CM

## 2017-08-22 DIAGNOSIS — Z862 Personal history of diseases of the blood and blood-forming organs and certain disorders involving the immune mechanism: Secondary | ICD-10-CM

## 2017-08-22 DIAGNOSIS — Z955 Presence of coronary angioplasty implant and graft: Secondary | ICD-10-CM

## 2017-08-22 DIAGNOSIS — E538 Deficiency of other specified B group vitamins: Secondary | ICD-10-CM

## 2017-08-22 DIAGNOSIS — I872 Venous insufficiency (chronic) (peripheral): Secondary | ICD-10-CM

## 2017-08-22 DIAGNOSIS — J449 Chronic obstructive pulmonary disease, unspecified: Secondary | ICD-10-CM

## 2017-08-22 DIAGNOSIS — E119 Type 2 diabetes mellitus without complications: Secondary | ICD-10-CM

## 2017-08-22 DIAGNOSIS — E7800 Pure hypercholesterolemia, unspecified: Secondary | ICD-10-CM

## 2017-08-22 DIAGNOSIS — R609 Edema, unspecified: Secondary | ICD-10-CM

## 2017-08-22 DIAGNOSIS — I213 ST elevation (STEMI) myocardial infarction of unspecified site: Secondary | ICD-10-CM

## 2017-08-22 DIAGNOSIS — I252 Old myocardial infarction: Secondary | ICD-10-CM

## 2017-08-22 DIAGNOSIS — I2510 Atherosclerotic heart disease of native coronary artery without angina pectoris: Secondary | ICD-10-CM

## 2017-08-22 DIAGNOSIS — E559 Vitamin D deficiency, unspecified: Secondary | ICD-10-CM

## 2017-08-22 DIAGNOSIS — E663 Overweight: Secondary | ICD-10-CM

## 2017-08-22 DIAGNOSIS — M3320 Polymyositis, organ involvement unspecified: Secondary | ICD-10-CM

## 2017-08-22 LAB — GLUCOSE, CAPILLARY: Glucose-Capillary: 134 mg/dL — ABNORMAL HIGH (ref 65–99)

## 2017-08-22 MED ORDER — ALPRAZOLAM 0.5 MG PO TABS: ORAL_TABLET | ORAL | 5 refills | Status: DC

## 2017-08-22 NOTE — Progress Notes (Signed)
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 LASIX '20mg'$  just as needed now... ~  prev OV's 130-140's / 80's... prev noted sl HA, chest tightness, sl SOB, & mult somatic complaints... Labs & renal- all WNL. ~  4/12:  BP=122/76 today, and similar at home per pt... She denies CP, palpit, SOB, edema, etc... Labs & renal- all WNL. ~  1/14: on ASA81, Imdur120, ToprolXL75, Cardizem240, Lasix20prn (seldom takes); BP=128/80 & she has mult somatic c/o- CWP, tired/no energy, but denies palpit, ch in SOB, edema, etc; followed by [facility] cards- DrTurner/ [name] & seen recently but we don't have note; they prev added RANEXA '500mg'$  Bid for her CP (now off this med); we discussed trial Lunesta '2mg'$ Qhs for sleep & Tramadol '50mg'$ Tid for pain... ~  9/14:  on ASA81, Imdur120, Toprol50-1/2Bid, Cardizem240, Lasix20prn (seldom takes); BP=132/74 & she has mult somatic c/o- CWP, tired/no energy, but denies palpit, ch in SOB, edema, etc; followed by [facility] cards- DrTurner/ [name], no recent notes ~  4/15: on ASA81, Toprol50-1/2Bid, Cardizem240, Lasix20prn (ave 1/d) & ?Amlodip5 from [employer]?; BP=124/76 & she has mult somatic c/o- CWP, tired/no energy, but denies palpit, ch in SOB, edema, etc; followed by [facility] cards- [name], no recent notes. ~   4/16: on ASA81, Metop25Bid, CardizemCD240, Losar100, Lasix40; BP= 146/88 and reminded to elim sodium & get weight down...  CAD- s/p IWMI 8/18 w/ thrombectomy/stent by [name] - prev followed by DrTTurner... ~  Cath in 2000 showed non-obstructive CAD (20-30% lesions in all 3 vessels) w/ rec for risk factor modification.   ~  NuclearStressTest 4/07 showed no ischemia & EF=73%. ~  Mar10:  she's concerned about BP and chest tightness, requests Cardiac eval DrTurner & we will refer... ~  2DEcho 4/10 showed norm LVF w/ EF=55-60%, norm MV, norm AoV, Gr1DD ~  4/10 eval by DrTurner w/ MYOVIEW- neg- breast attenuation, no regional wall motion abn, EF= 75% ~  4/10 Cath by DrTurner w/ 90% small 2nd diag branch of LAD <too sm for PTCA> & luminal irreg up to 20% in RCA, +DD- Med Rx. ~  She had more chest tightness & another  Myoview from DrTurner 8/12- it too was neg, no ischemia, no infarct; +breast attenuation; EF=70% & nno regional wall motion abnormalities;  IMDUR has been incr to '120mg'$ /d. ~  1/14: she tells me [facility] but we don't have notes from him> EKG sent showed NSR, rate72, wnl, NAD... ~  EKG 5/14 showed NSR, rate 93, WNL, NAD... ~  12/14: she saw [name] for Cards f/u (note reviewed)> HBP, CAD, Chol; off Imdur w/o angina, continue same meds including both Cardizem & Amlodipine, encouraged to continue [facility] study... ~  She continues regular f/u visits w/ [name], CARDS> his notes are reviewed... ~  08/22/17>  Now on ASA81, Brillinta90Bid, Metop50Bid, CardizemCD240, Losar100, Lasix20prn + Repatha; followed by [place] - she was referred to the [facility] foot clinic by DrWWoods and seen by DrSevier 6/08= ven insuffic Rx w/ TED's, no salt, elevation, etc. ~  10/10: notes bilat ankle ?lipoma in front of the lat malleoli- asymptomatic but several people have noticed them and she request refer to Ortho (rec DrBednarz when she is  ready).  HYPERCHOLESTEROLEMIA  - on LIPITOR '40mg'$ /d & ZETIA '10mg'$ /d + CoQ10 per DrTurner... Diet & exercise stressed to the pt. ~  FLP here 12/07 on Lip10 showed TChol 156, TG 94, HDL 51, LDL 86 ~  FLP 1/09 on Lip10 showed TChol 157, TG 122, HDL 49, LDL 84 ~  FLP at [place] study 5/09 on Lip10 showed TChol 176, TG 116, HDL 55, LDL 98 ~  FLP 3/10 on Lip10 showed TChol 170, TG 94, HDL 59, LDL 93 ~  FLP 6/10 by DrTurner on Lip20 showed TChol 134, TG 82, HDL 55, LDL 73 ~  FLP 4/11 here on Lip20+Zetia? showed TChol 145, TG 74, HDL 68, LDL 63 ~  9/11:  she reports that DrTurner incr Lipitor to '40mg'$ , plus the Zetia '10mg'$ , and she does her FLPs. ~  FLP here 4/12 on Lip40+Zetia showed TChol 153, TG 54, HDL 53, LDL 90... Copy sent to DrTurner ~  [place] by [name] 7/13 on Lip40+Zetia10 showed TChol 137, TG 89, HDL 42, LDL 73 ~  9/14: on Lip40 => notes from [place] says ?Lip80 +[facility] study drug?; she has gained 4# to 190# today; FLP is followed by [facility] Cards per pt & she reports concern for "particle size" & last avail FLP was 7/14 by [name] w/ TChol 190, HDL 100, LDL 40 ~  FLP followed by [name] for CARDS, and [name] for Endocrine... ~  Now on REPATHA shots Q14d...   FOLLOWED by [name] FOR ENDOCRINOLOGY >>   DIABETES MELLITUS  - on Metformin '500mg'$ Bid + PARLODEL 2.'5mg'$ /d per DrEllison... She is intol to Actos w/ edema, she stopped Januvia due to ?side effect, she wondered about Tajenta since her daugh works for [employer]. ~  labs 9/08 showed FBS=119, HgA1c=6.6 ~  labs 5/09 at [facility] study showed BS= 108, HgA1c= 6.5 ~  labs 3/10 showed BS= 113, A1c= 6.6 ~  labs 6/10 by DrTurner showed BS= 92; and 3/11 BS= 127 ~  labs 4/11 showed BS= 124, A1c= 7.6.[name]. rec> incr Metform to Bid; she had nutrition counseling at [facility]... ~  6/11:  pt requested Endocrine consult & seen by DrEllison w/ Januvia '100mg'$ /d added. ~  labs 9/11 showed BS= 98, A1c= 7.3.Marland KitchenMarland Kitchen she stopped Januvia due to side effects & wonders about using  Tajenta instead. ~  DrEllison started Bromocyptine 2.'5mg'$ /d... She is now taking this +Metform '500mg'$ Bid... ~  Labs 4/12 (wt=195#) showed  Bs= 116, A1c= 7.1 ~  Labs 7/12 showed A1c= 7.2;  Umicroalb= 3.5 ~  Labs 10/12 (wt=186#) showed A1c= 6.7 NOTE: She participated in a [facility] trial called the AfricanAmerican- Diabetes Heart Study in 2012; they sent a report indicating: 1) her memory was fine, but her MRI Brain showed sm vessel dis & mild sinus dis, otherw neg; 2) she may have treatable depression but we tried her on Zoloft & she was intol==> ch to Lexapro. ~  followed by [name] on Metform500Bid & Onglyza2.5; labs from [place] 9/13 showed BS=201, A1c=6.0; he rec same meds... ~  9/14: followed by [name] on Metform500Bid & Onglyza2.5 => ?he changed to Kombiglyze5-1000 daily?; last labs from [place] 7/14 showed BS=105, A1c=6.5; and she remains on the same meds... ~  1/15: she had f/u [name] for Endocrine> DM, Obesity, Adrenal adenoma, VitB12 defic; labs reviewed, note reviewed- he tried Belviq but she was intol... ~  She continues regular follow up w/ [name]... ~  Switched to [facility] [id number] Qam after her IWMI 8/18... A1c=6.8   FOLLOWED by [name] et al for GASTORENTEROLOGY >>   GERD SYMPTOMS - pt placed on PROTONIX '40mg'$ /d w/ improvement in reflux symptoms...    ADDENDUM>> note from [name] indicates> upper endoscopy in July 1994 showed antral gastritis. 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  IRRITABLE BOWEL SYNDROME  - colonoscopy 7/03 by [name] was WNL. ~  9/14: she was referred to GI for f/u colon but never did it... ~  4/15: we will refer her to GI again...  RENAL CALCULUS  - sm stone seen in L kidney on CTScan... she had lithotripsy by [facility] ~9/09.  POLYMYOSITIS >> now followed by DrBeekman FIBROMYALGIA  - prev Rx by [name] in 2006... ~  1/14: This is her CC w/ not resting well, wakes tired, poor energy, aching/ sore/ tender (esp in  chest in AM) & we discussed trial Lunesta '2mg'$ Qhs, & Tramadol '50mg'$ Tid; plus rest, heat, etc... States she's INTOL Ambien w/ weird dreams & Benedryl/ Melatonin w/o help... ~  11/14: she went to the ER w/ LBP> felt to be a lumbar strain... ~  She was seen by [name] for Rheum [id number]...  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 supplemented w/ OTC Vit D 1000 u daily. ~  Vit D level was lower from [name] & he treated w/ Vit D 50K weekly for awhile, then switched to OTC supplement...  ANXIETY - on ALPRAZOLAM 0.'5mg'$  Prn... DEPRESSION - prev on Lexapro '20mg'$ /d but she stopped on her own ~10/13 ?cost issues? ~  4/12: c/o feeling sad & anxious all the time- weak, not resting well, under a lot of stress; rec to seek counselling thru her local church or let us refer to [facility], & start RX w/ Zoloft 50==> but she was INTOL... ~  2013: switched to Lexapro & seems  to be doing better but didn't stick w/ it due to cost issues...  ANEMIA & LOW FERRITIN LEVEL >>  B12 DEFICIENCY >> treated by [name] w/ B12 shots ~  9/14: she tells me that Ferritin is low & [name] tried her on Oral Fe prep w/o improvement & wants me to f/u and treat this problem; we discussed need to recheck labs here- LABS 9/14 showed Hg=12.5, MCV=88, Fe=47 (12%sat), Ferritin=10.8; she is due for f/u colon w/ [name] & we will refer, rec to start FeSO4 '325mg'$  Bid w/ VitC500  w/ plans for f/u CBC, Fe studies in 2-98mo ~  4/15:  Labs showed Hg= 12.5 but Fe=31 (8%); she was only taking the Fe once daily 7 advised to incr to Bid w/ vitC500; needs f/u colon & we will refer to [name] again...  HEALTH MAINTENANCE: ~  GI:  Followed by [name] & colon due 7/13... ~  GYN:  Followed by DrDillard & she gets yearly PAP, Mammogram, hasn't had baseline BMD yet, [facility]... ~  Immunizations:  She gets the yearly Flu shots at school;  Had PNEUMOVAX previously; TDAP given 4/12...   Past Surgical History:  Procedure Laterality Date  . CORONARY STENT INTERVENTION N/A 06/15/2017   Procedure: CORONARY STENT INTERVENTION;  Surgeon: VJettie Booze MD;  [facility];  Service: Cardiovascular;  Laterality: N/A;  . heart catherization    . LEFT HEART CATH AND CORONARY ANGIOGRAPHY N/A 06/15/2017   Procedure: LEFT HEART CATH AND CORONARY ANGIOGRAPHY;  Surgeon: VJettie Booze MD;  [facility];  Service: Cardiovascular;  Laterality: N/A;  . TONSILLECTOMY    No other past surgical history on file - other than Lithotripsy for renal stone...   Outpatient Encounter Prescriptions as of 08/22/2017  Medication Sig  . albuterol (PROVENTIL HFA;VENTOLIN HFA) 108 (90 BASE) MCG/ACT inhaler Inhale 1-2 puffs into the lungs every 6 (six) hours as needed for wheezing or shortness of breath.  . ALPRAZolam (XANAX) 0.5 MG tablet Take 1 tablet by mouth daily at bedtime  . aspirin 81 MG chewable tablet Chew 1 tablet (81 mg total) by mouth daily.  .Marland KitchenCARTIA XT 240 MG 24 hr capsule take 1 capsule by mouth once daily  . Cyanocobalamin 1000 MCG/ML KIT Inject 1,000 mg as directed every 30 (thirty) days.  . Empagliflozin-Metformin HCl (SYNJARDY) 03-999 MG TABS Take 1 tablet by mouth daily.  . Evolocumab (REPATHA SURECLICK) 1096MG/ML SOAJ Inject 1 pen into the skin every 14 (fourteen) days.  . fluticasone (FLONASE) 50 MCG/ACT nasal spray Place 2 sprays  into both nostrils at bedtime.  . furosemide (LASIX) 20 MG tablet Take 20 mg by mouth daily as needed for fluid or edema.  .Marland KitchenHP ACTHAR 80 UNIT/ML injectable gel Inject 80 Units into the skin 3 (three) times a week.   . losartan (COZAAR) 100 MG tablet take 1 tablet by mouth once daily  . metoprolol tartrate (LOPRESSOR) 50 MG tablet Take 1 tablet (50 mg total) by mouth 2 (two) times daily.  . mometasone-formoterol (DULERA) 100-5 MCG/ACT AERO Inhale 2 puffs into the lungs 2 (two) times daily.  . nitroGLYCERIN (NITROSTAT) 0.4 MG SL tablet Place 1 tablet (0.4 mg total) under the tongue every 5 (five) minutes as needed for chest pain.  .Marland Kitchenondansetron (ZOFRAN-ODT) 4 MG disintegrating tablet Take 4 mg by mouth every 8 (eight) hours as needed for nausea or vomiting.  . pantoprazole (PROTONIX) 40 MG tablet Take 1 tablet (40  mg total) by mouth daily.  . ticagrelor (BRILINTA) 90 MG TABS tablet Take 1 tablet (90 mg total) by mouth 2 (two) times daily.  . traMADol (ULTRAM) 50 MG tablet Take 1 tablet (50 mg total) by mouth every 6 (six) hours as needed for severe pain.  . valACYclovir (VALTREX) 500 MG tablet Take 500 mg by mouth daily.   . [DISCONTINUED] ALPRAZolam (XANAX) 0.5 MG tablet Take 0.25 mg by mouth 2 (two) times daily. Take 1 tablet by mouth daily at bedtime  . [DISCONTINUED] JENTADUETO XR 2.03-999 MG TB24 Take 1 tablet by mouth daily.  . [DISCONTINUED] PREMPRO 0.3-1.5 MG tablet Take 1 tablet by mouth daily.  . [DISCONTINUED] TRINTELLIX 10 MG TABS Take 1 tablet by mouth daily.   No facility-administered encounter medications on file as of 08/22/2017.     Allergies  Allergen Reactions  . Actos [Pioglitazone] Other (See Comments)    REACTION: pt states INTOL w/ edema  . Codeine Other (See Comments)    REACTION: vomiting  . Januvia [Sitagliptin] Nausea Only  . Lactose Intolerance (Gi) Diarrhea  . Morphine Nausea Only    REACTION: vomiting    Immunization History  Administered Date(s)  Administered  . Influenza Split 08/16/2011, 08/16/2012, 08/16/2013, 09/04/2014  . Influenza Whole 09/16/2009, 08/15/2010  . Influenza,inj,Quad PF,6+ Mos 08/30/2016, 06/16/2017  . Influenza-Unspecified 08/19/2015  . Pneumococcal Polysaccharide-23 11/15/2004  . Tdap 02/22/2011    Current Medications, Allergies, Past Medical History, Past Surgical History, Family History, and Social History were reviewed in [employer] record.   Review of Systems        See HPI - all other systems neg except as noted...      The patient complains of weight gain, dyspnea on exertion, and peripheral edema.  The patient denies anorexia, fever, weight loss, vision loss, decreased hearing, hoarseness, chest pain, syncope, prolonged cough, headaches, hemoptysis, abdominal pain, melena, hematochezia, severe indigestion/heartburn, hematuria, incontinence, genital sores, suspicious skin lesions, transient blindness, difficulty walking, depression, unusual weight change, abnormal bleeding, enlarged lymph nodes, and angioedema.     Objective:   Physical Exam     WD, Overweight, 61 y/o BF in NAD... GENERAL:  Alert & oriented; pleasant & cooperative. HEENT:  [place]/AT, EOM-wnl, PERRLA, EACs-clear, TMs-wnl, NOSE-clear, THROAT-clear & wnl. NECK:  Supple w/ fairROM; no JVD; normal carotid impulses w/o bruits; no thyromegaly or nodules palpated; no lymphadenopathy. CHEST:  decrBS at bases, few scar rhonchi & end-exp wheezing, no rales, no consolidation... HEART:  Regular Rhythm; without murmurs/ rubs/ or gallops. ABDOMEN:  Soft & nontender; normal bowel sounds; no organomegaly or masses detected. EXT: without deformities, mild arthritic changes; no varicose veins but +venous insuffic & tr edema; +trigger points. NEURO:  CN's intact;  no focal neuro deficits... DERM:  No lesions noted; no rash etc...  RADIOLOGY DATA:  Reviewed in the EPIC EMR & discussed w/ the patient...  LABORATORY DATA:  Reviewed  in the EPIC EMR & discussed w/ the patient...   Assessment & Plan:    RESP>  Hx AR, HxAsthma, old sarcoid, mild OSA>  All stable, breathing OK, notes some wheezing w/ activity; exam reveals some secretions in the airway & rec to take [place] '1200mg'$  bid, Fluids, etc;. CXR remains stable/ NAD... 10/20/15>  Presented w/ refractory AB- already on Pred- given Depo80, adding Levaquin500 x7d, ADVAIR100Bid, Mucinex600-2Bid + fluids... 02/02/16>  Presents w/ another refractory AB- currently off Pred & MTX, on Imuran; treated w/ Depo, Medrol taper, Augmentin, Mucinex, Albut=HFA...  04/2016>  Back to baseline 11/04/16>  She has URI & we wrote for ZPak => subseq Augmentin, Saline, flonase, Mucinex... 05/05/17>   Leita is improved s/p Augmentin & Pred Rx for her URI & AB exac;  She remains on Symbicort80-2spBid + Mucinex600- 1to2Bid w/ extra fluids;  I reminded her to be sure to requests notes sent to [name] from [place] as they are not on Epic;  She will maintain f/u w/ these specialists + [name] for CARDS;  We plan recheck in 3-10mo  CARDS>  Followed by DrTTurner/ VRica Mote?on meds listed? SHE DID NOT BRING MEDS TO THE OV> Hx HBP, CAD, VI, etc;  Chest pain is felt to be non-cardiac & likely related to her FM; Doing satis & we reviewed exercise program etc...  CHOL>  [facility] intol Lip + Zetia; she took part in a study drug from [facility] => [facility]  DM>  Followed by [name] on Metformin, Onglyzal; A1c improved to 6.7 in 2012, then 6.0 in 2013, & 6.5  In 2014;  rec continue meds & [place]  GI>  Stable on Protonix as needed; see GI eval from [name] 7/15...  POLYMYOSITIS/ FM>  Diagnoses w/ Polymyositis recently (2016) & started on Pred, MTX, folate by DrBeekman=> switched to [employer]but insurance doesn't want to pay, she is reminded [place]  Anxiety>  She remains on Alprazolam as needed;  & she stopped the Lexapro..  Anemia, Low Ferritin> on FeSO4 '325mg'$  & VitC 500...   B12 Defic> on B12 shots per [name]...  ANXIETY>  DErionnahas been having progressive difficulties at work (Youth workerat [employer]; due to this stress and her mult medical issues including polymyositis (DrBeekman), DM2 ([name]), heart problems ([name])- she tells me she is taking a LOA for the rest of this school yr & planning on retirement...   Patient's Medications  New Prescriptions   No medications on file  Previous Medications   ALBUTEROL (PROVENTIL HFA;VENTOLIN HFA) 108 (90 BASE) MCG/ACT INHALER    Inhale 1-2 puffs into the lungs every 6 (six) hours as needed for wheezing or shortness of breath.   ASPIRIN 81 MG CHEWABLE TABLET    Chew 1 tablet (81 mg total) by mouth daily.   CARTIA XT 240 MG 24 HR CAPSULE    take 1 capsule by mouth once daily   CYANOCOBALAMIN 1000 MCG/ML KIT    Inject 1,000 mg as directed every 30 (thirty) days.   EMPAGLIFLOZIN-METFORMIN HCL (SYNJARDY) 03-999 MG TABS    Take 1 tablet by mouth daily.   EVOLOCUMAB (REPATHA SURECLICK) 1401MG/ML SOAJ    Inject 1 pen into the skin every 14 (fourteen) days.   FLUTICASONE (FLONASE) 50 MCG/ACT NASAL SPRAY    Place 2 sprays into both nostrils at bedtime.   FUROSEMIDE (LASIX) 20 MG TABLET    Take 20 mg by mouth daily as needed for fluid or edema.   HP ACTHAR 80 UNIT/ML INJECTABLE GEL    Inject 80 Units into the skin 3 (three) times a week.    LOSARTAN (COZAAR) 100 MG TABLET    take 1 tablet by mouth once daily   METOPROLOL TARTRATE (LOPRESSOR) 50 MG TABLET    Take 1 tablet (50 mg total) by mouth 2 (two) times daily.   MOMETASONE-FORMOTEROL (DULERA) 100-5 MCG/ACT AERO    Inhale 2 puffs into the lungs 2 (two) times  daily.   NITROGLYCERIN (NITROSTAT) 0.4 MG SL TABLET    Place 1 tablet (0.4 mg total) under the tongue every 5 (five) minutes as needed for chest pain.   ONDANSETRON (ZOFRAN-ODT) 4 MG DISINTEGRATING TABLET    Take 4  mg by mouth every 8 (eight) hours as needed for nausea or vomiting.   PANTOPRAZOLE (PROTONIX) 40 MG TABLET    Take 1 tablet (40 mg total) by mouth daily.   TICAGRELOR (BRILINTA) 90 MG TABS TABLET    Take 1 tablet (90 mg total) by mouth 2 (two) times daily.   TRAMADOL (ULTRAM) 50 MG TABLET    Take 1 tablet (50 mg total) by mouth every 6 (six) hours as needed for severe pain.   VALACYCLOVIR (VALTREX) 500 MG TABLET    Take 500 mg by mouth daily.   Modified Medications   Modified Medication Previous Medication   ALPRAZOLAM (XANAX) 0.5 MG TABLET ALPRAZolam (XANAX) 0.5 MG tablet      Take 1 tablet by mouth daily at bedtime    Take 0.25 mg by mouth 2 (two) times daily. Take 1 tablet by mouth daily at bedtime  Discontinued Medications   JENTADUETO XR 2.03-999 MG TB24    Take 1 tablet by mouth daily.   PREMPRO 0.3-1.5 MG TABLET    Take 1 tablet by mouth daily.   TRINTELLIX 10 MG TABS    Take 1 tablet by mouth daily.

## 2017-08-22 NOTE — Patient Instructions (Signed)
Today we updated your med list in our EPIC system...    Continue your current medications the same...  We refilled your ALPRAZOLAM per request...  Keep up the good work w/ diet, exercise, cardiac rehab, etc...  Call for any questions...  Let's plan a follow up visit in 71mo, sooner if needed for problems.Marland KitchenMarland Kitchen

## 2017-08-23 ENCOUNTER — Encounter (HOSPITAL_COMMUNITY): Payer: Self-pay

## 2017-08-23 NOTE — Progress Notes (Deleted)
Cardiac Individual Treatment Plan  Patient Details  Name: Jocelyn [name] MRN: [id number] Date of Birth: 10-23-1956 Referring Provider:     CARDIAC REHAB PHASE II ORIENTATION from 07/12/2017 in [facility]  Referring Provider  Thamas Jaegers MD      Initial Encounter Date:    CARDIAC REHAB PHASE II ORIENTATION from 07/12/2017 in [facility]  Date  07/12/17  Referring Provider  Thamas Jaegers MD      Visit Diagnosis: 06/15/17 Status post coronary artery stent placement  06/15/17 ST elevation myocardial infarction (STEMI), unspecified artery ([place])  Patient's Home Medications on Admission:  Current Outpatient Prescriptions:  .  albuterol (PROVENTIL HFA;VENTOLIN HFA) 108 (90 BASE) MCG/ACT inhaler, Inhale 1-2 puffs into the lungs every 6 (six) hours as needed for wheezing or shortness of breath., Disp: 8 g, Rfl: 2 .  ALPRAZolam (XANAX) 0.5 MG tablet, Take 1 tablet by mouth daily at bedtime, Disp: 30 tablet, Rfl: 5 .  aspirin 81 MG chewable tablet, Chew 1 tablet (81 mg total) by mouth daily., Disp: , Rfl:  .  CARTIA XT 240 MG 24 hr capsule, take 1 capsule by mouth once daily, Disp: 90 capsule, Rfl: 3 .  Cyanocobalamin 1000 MCG/ML KIT, Inject 1,000 mg as directed every 30 (thirty) days., Disp: , Rfl:  .  Empagliflozin-Metformin HCl (SYNJARDY) 03-999 MG TABS, Take 1 tablet by mouth daily., Disp: , Rfl:  .  Evolocumab (REPATHA SURECLICK) 809 MG/ML SOAJ, Inject 1 pen into the skin every 14 (fourteen) days., Disp: 2 pen, Rfl: 11 .  fluticasone (FLONASE) 50 MCG/ACT nasal spray, Place 2 sprays into both nostrils at bedtime., Disp: 16 g, Rfl: 2 .  furosemide (LASIX) 20 MG tablet, Take 20 mg by mouth daily as needed for fluid or edema., Disp: , Rfl:  .  HP ACTHAR 80 UNIT/ML injectable gel, Inject 80 Units into the skin 3 (three) times a week. , Disp: , Rfl:  .  losartan (COZAAR) 100 MG tablet, take 1 tablet by mouth once daily, Disp: 30  tablet, Rfl: 10 .  metoprolol tartrate (LOPRESSOR) 50 MG tablet, Take 1 tablet (50 mg total) by mouth 2 (two) times daily., Disp: 60 tablet, Rfl: 5 .  mometasone-formoterol (DULERA) 100-5 MCG/ACT AERO, Inhale 2 puffs into the lungs 2 (two) times daily., Disp: , Rfl:  .  nitroGLYCERIN (NITROSTAT) 0.4 MG SL tablet, Place 1 tablet (0.4 mg total) under the tongue every 5 (five) minutes as needed for chest pain., Disp: 25 tablet, Rfl: 2 .  ondansetron (ZOFRAN-ODT) 4 MG disintegrating tablet, Take 4 mg by mouth every 8 (eight) hours as needed for nausea or vomiting., Disp: , Rfl:  .  pantoprazole (PROTONIX) 40 MG tablet, Take 1 tablet (40 mg total) by mouth daily., Disp: 30 tablet, Rfl: 11 .  ticagrelor (BRILINTA) 90 MG TABS tablet, Take 1 tablet (90 mg total) by mouth 2 (two) times daily., Disp: 180 tablet, Rfl: 3 .  traMADol (ULTRAM) 50 MG tablet, Take 1 tablet (50 mg total) by mouth every 6 (six) hours as needed for severe pain., Disp: 30 tablet, Rfl: 0 .  valACYclovir (VALTREX) 500 MG tablet, Take 500 mg by mouth daily. , Disp: , Rfl:   Past Medical History: Past Medical History:  Diagnosis Date  . Acute bronchitis   . Allergic rhinitis, cause unspecified   . Anemia   . Anxiety   . B12 deficiency   . BV (bacterial vaginosis) 06/22/1996  . Calculus  of kidney   . Calculus of kidney   . Coronary atherosclerosis of unspecified type of vessel, native or graft   . Dizziness   . Essential hypertension, benign   . Fibroid 2003  . Fibromyalgia   . H/O dysmenorrhea 2008  . H/O varicella   . Headache(784.0)    frequently  . HSV-2 infection 2009  . Hyperplastic colon polyp 05/16/2014  . Irritable bowel syndrome   . Meniere's disease, unspecified   . Menses, irregular 2003  . Myalgia and myositis, unspecified   . Obstructive sleep apnea (adult) (pediatric)   . Perimenopausal symptoms 2003  . Pure hypercholesterolemia   . Sarcoidosis   . Type II or unspecified type diabetes mellitus without  mention of complication, not stated as uncontrolled   . Unspecified venous (peripheral) insufficiency   . Vitamin D deficiency   . Vulvitis 2010  . Yeast infection     Tobacco Use: History  Smoking Status  . Never Smoker  Smokeless Tobacco  . Never Used    Comment: Daily Caffeine - 1  Exercise 2-3 times/weekly    Labs: Recent Review Flowsheet Data    Labs for ITP Cardiac and Pulmonary Rehab Latest Ref Rng & Units 08/14/2014 01/27/2015 06/01/2016 08/18/2016 06/15/2017   Cholestrol 0 - 200 mg/dL 167 181 296(H) 191 182   LDLCALC 0 - 99 mg/dL - 104(H) 202(H) 107 93   LDLDIRECT mg/dL 91.6 - - - -   HDL >40 mg/dL 46.90 44.40 53 41(L) 56   Trlycerides <150 mg/dL 215.0(H) 162.0(H) 205(H) 214(H) 163(H)   Hemoglobin A1c 4.6 - 6.5 % - - - - -   TCO2 0 - 100 mmol/L - - - - 22      Capillary Blood Glucose: Lab Results  Component Value Date   GLUCAP 134 (H) 08/22/2017   GLUCAP 123 (H) 08/19/2017   GLUCAP 141 (H) 08/15/2017   GLUCAP 146 (H) 08/05/2017   GLUCAP 131 (H) 07/27/2017     Exercise Target Goals:    Exercise Program Goal: Individual exercise prescription set with THRR, safety & activity barriers. Participant demonstrates ability to understand and report RPE using BORG scale, to self-measure pulse accurately, and to acknowledge the importance of the exercise prescription.  Exercise Prescription Goal: Starting with aerobic activity 30 plus minutes a day, 3 days per week for initial exercise prescription. Provide home exercise prescription and guidelines that participant acknowledges understanding prior to discharge.  Activity Barriers & Risk Stratification:     Activity Barriers & Cardiac Risk Stratification - 07/12/17 1452      Activity Barriers & Cardiac Risk Stratification   Activity Barriers Back Problems;Balance Concerns;Other (comment)   Comments B knee pain R>L   Cardiac Risk Stratification High      6 Minute Walk:     6 Minute Walk    Row Name 07/12/17 1429  07/12/17 1506 07/12/17 1513     6 Minute Walk   Phase Initial  -  -   Distance 1246 feet  -  -   Walk Time 6 minutes  -  -   # of Rest Breaks 0  -  -   MPH 2.4  -  -   METS 2.8  -  -   RPE 12  -  -   VO2 Peak 9.8  -  -   Symptoms  -  - Yes (comment)   Comments  -  - fatigue at end of walk test   Resting HR  89 bpm  -  -   Resting BP 110/78  -  -   Resting Oxygen Saturation  98 %  -  -   Exercise Oxygen Saturation  during 6 min walk 97 %  -  -   Max Ex. HR 101 bpm  -  -   Max Ex. BP 118/70  -  -   2 Minute Post BP  - 100/70  -      Oxygen Initial Assessment:   Oxygen Re-Evaluation:   Oxygen Discharge (Final Oxygen Re-Evaluation):   Initial Exercise Prescription:     Initial Exercise Prescription - 07/12/17 1500      Date of Initial Exercise RX and Referring Provider   Date 07/12/17   Referring Provider Thamas Jaegers MD     Treadmill   MPH 2.2   Grade 0   Minutes 10   METs 2.68     Recumbant Bike   Level 2   Watts 10   Minutes 10   METs 2.38     NuStep   Level 2   SPM 70   Minutes 10   METs 2     Track   Laps 8   Minutes 10   METs 2.39     Prescription Details   Frequency (times per week) 3   Duration Progress to 30 minutes of continuous aerobic without signs/symptoms of physical distress     Intensity   THRR 40-80% of Max Heartrate 63-126   Ratings of Perceived Exertion 11-13   Perceived Dyspnea 0-4     Progression   Progression Continue to progress workloads to maintain intensity without signs/symptoms of physical distress.     Resistance Training   Training Prescription Yes   Weight 2lbs   Reps 10-15      Perform Capillary Blood Glucose checks as needed.  Exercise Prescription Changes:     Exercise Prescription Changes    Row Name 07/20/17 1704 07/25/17 1700           Response to Exercise   Blood Pressure (Admit) 122/76 116/70      Blood Pressure (Exercise) 140/82 140/70      Blood Pressure (Exit) 122/80 110/70       Heart Rate (Admit) 72 bpm 88 bpm      Heart Rate (Exercise) 91 bpm 110 bpm      Heart Rate (Exit) 72 bpm 82 bpm      Rating of Perceived Exertion (Exercise) 12 12      Symptoms none c/o lightheadedness after walking on treadmill      Comments pt was oriented to exercise equipment discussed slowing down speed vs completely stopping on TM      Duration Continue with 30 min of aerobic exercise without signs/symptoms of physical distress. Continue with 30 min of aerobic exercise without signs/symptoms of physical distress.      Intensity THRR unchanged THRR unchanged        Progression   Progression Continue to progress workloads to maintain intensity without signs/symptoms of physical distress. Continue to progress workloads to maintain intensity without signs/symptoms of physical distress.      Average METs 1.6 2.3        Resistance Training   Training Prescription Yes Yes      Weight 2lbs 2lbs      Reps 10-15 10-15      Time 10 Minutes 10 Minutes        Treadmill   MPH  - 2.2  Grade  - 0      Minutes  - 15      METs  - 2.68        NuStep   Level 2 2      SPM 60 70      Minutes 30 15      METs 1.6 1.8         Exercise Comments:     Exercise Comments    Row Name 07/26/17 1435           Exercise Comments Pt has completed 3 exercise sessions in cardiac, to soon to evaluate goals but pt is making great progress.          Exercise Goals and Review:     Exercise Goals    Row Name 07/12/17 1507 07/12/17 1508           Exercise Goals   Increase Physical Activity Yes  -      Intervention Provide advice, education, support and counseling about physical activity/exercise needs.;Develop an individualized exercise prescription for aerobic and resistive training based on initial evaluation findings, risk stratification, comorbidities and participant's personal goals.  -      Expected Outcomes Achievement of increased cardiorespiratory fitness and enhanced flexibility,  muscular endurance and strength shown through measurements of functional capacity and personal statement of participant.  -      Increase Strength and Stamina Yes  -      Intervention Provide advice, education, support and counseling about physical activity/exercise needs.;Develop an individualized exercise prescription for aerobic and resistive training based on initial evaluation findings, risk stratification, comorbidities and participant's personal goals.  -      Expected Outcomes Achievement of increased cardiorespiratory fitness and enhanced flexibility, muscular endurance and strength shown through measurements of functional capacity and personal statement of participant.  -      Able to understand and use rate of perceived exertion (RPE) scale  - Yes      Intervention  - Provide education and explanation on how to use RPE scale      Expected Outcomes  - Long Term:  Able to use RPE to guide intensity level when exercising independently;Short Term: Able to use RPE daily in rehab to express subjective intensity level      Knowledge and understanding of Target Heart Rate Range (THRR)  - Yes      Intervention  - Provide education and explanation of THRR including how the numbers were predicted and where they are located for reference      Expected Outcomes  - Short Term: Able to state/look up THRR;Short Term: Able to use daily as guideline for intensity in rehab;Long Term: Able to use THRR to govern intensity when exercising independently      Able to check pulse independently  - Yes      Understanding of Exercise Prescription  - Yes      Intervention  - Provide education, explanation, and written materials on patient's individual exercise prescription      Expected Outcomes  - Long Term: Able to explain home exercise prescription to exercise independently;Short Term: Able to explain program exercise prescription         Exercise Goals Re-Evaluation :     Exercise Goals Re-Evaluation    Row  Name 07/26/17 1436 08/05/17 1742           Exercise Goal Re-Evaluation   Exercise Goals Review Increase Physical Activity;Able to understand and use rate of perceived exertion (  RPE) scale;Knowledge and understanding of Target Heart Rate Range (THRR);Understanding of Exercise Prescription;Increase Strength and Stamina Increase Physical Activity;Able to understand and use rate of perceived exertion (RPE) scale;Knowledge and understanding of Target Heart Rate Range (THRR);Understanding of Exercise Prescription;Increase Strength and Stamina;Able to check pulse independently      Comments Pt is exercising for 30 minutes and is doing well. Pt did report dizziness after treadmill. Pt abruptly stopped machine, express to pt to slow speed down and come to a stop slowly to avoid abrupt stop and dizziness. Reviewed home exercise with pt today.  Pt plans to walk or go to [facility] for exercise.  Pt plans to exercise 2x/week in addition to coming got cardiac rehab. Reviewed THR, pulse, RPE, sign and symptoms, NTG use, and when to call 911 or MD.  Also discussed weather considerations and indoor options.  Pt voiced understanding.      Expected Outcomes Pt will be able to use treadmill safely and feel more confident with using treadmill machine or change to walking track.  Pt will be compliant with HEP and improve in cardiorespiratory fitness.          Discharge Exercise Prescription (Final Exercise Prescription Changes):     Exercise Prescription Changes - 07/25/17 1700      Response to Exercise   Blood Pressure (Admit) 116/70   Blood Pressure (Exercise) 140/70   Blood Pressure (Exit) 110/70   Heart Rate (Admit) 88 bpm   Heart Rate (Exercise) 110 bpm   Heart Rate (Exit) 82 bpm   Rating of Perceived Exertion (Exercise) 12   Symptoms c/o lightheadedness after walking on treadmill   Comments discussed slowing down speed vs completely stopping on TM   Duration Continue with 30 min of aerobic exercise  without signs/symptoms of physical distress.   Intensity THRR unchanged     Progression   Progression Continue to progress workloads to maintain intensity without signs/symptoms of physical distress.   Average METs 2.3     Resistance Training   Training Prescription Yes   Weight 2lbs   Reps 10-15   Time 10 Minutes     Treadmill   MPH 2.2   Grade 0   Minutes 15   METs 2.68     NuStep   Level 2   SPM 70   Minutes 15   METs 1.8      Nutrition:  Target Goals: Understanding of nutrition guidelines, daily intake of sodium <152m, cholesterol <206m calories 30% from fat and 7% or less from saturated fats, daily to have 5 or more servings of fruits and vegetables.  Biometrics:     Pre Biometrics - 07/12/17 1512      Pre Biometrics   % Body Fat 32.2 %   Flexibility 0 in       Nutrition Therapy Plan and Nutrition Goals:     Nutrition Therapy & Goals - 07/13/17 1118      Nutrition Therapy   Diet Carb Modified, Therapeutic Lifestyle Changes     Personal Nutrition Goals   Nutrition Goal Pt to identify and limit food sources of saturated fat, trans fat, and sodium   Personal Goal #2 Pt to identify food quantities necessary to achieve weight loss of 6-24 lb (2.7-10.9 kg) at graduation from cardiac rehab.      Intervention Plan   Intervention Prescribe, educate and counsel regarding individualized specific dietary modifications aiming towards targeted core components such as weight, hypertension, lipid management, diabetes, heart failure and other  comorbidities.   Expected Outcomes Short Term Goal: Understand basic principles of dietary content, such as calories, fat, sodium, cholesterol and nutrients.;Long Term Goal: Adherence to prescribed nutrition plan.      Nutrition Discharge: Nutrition Scores:     Nutrition Assessments - 07/13/17 1118      MEDFICTS Scores   Pre Score 44      Nutrition Goals Re-Evaluation:   Nutrition Goals  Re-Evaluation:   Nutrition Goals Discharge (Final Nutrition Goals Re-Evaluation):   Psychosocial: Target Goals: Acknowledge presence or absence of significant depression and/or stress, maximize coping skills, provide positive support system. Participant is able to verbalize types and ability to use techniques and skills needed for reducing stress and depression.  Initial Review & Psychosocial Screening:     Initial Psych Review & Screening - 07/12/17 1556      Initial Review   Current issues with None Identified     Family Dynamics   Good Support System? Yes  family    Comments upon brief assessment, no psychosocial needs identified, no interventions necessary      Barriers   Psychosocial barriers to participate in program There are no identifiable barriers or psychosocial needs.     Screening Interventions   Interventions Encouraged to exercise      Quality of Life Scores:     Quality of Life - 07/12/17 1412      Quality of Life Scores   Health/Function Pre 19.85 %   Socioeconomic Pre 24.75 %   Psych/Spiritual Pre 23.93 %   Family Pre 28.5 %   GLOBAL Pre 22.93 %      PHQ-9: Recent Review Flowsheet Data    Depression screen [facility] 2/9 07/20/2017 12/03/2014   Decreased Interest 0 0   Down, Depressed, Hopeless 0 0   PHQ - 2 Score 0 0     Interpretation of Total Score  Total Score Depression Severity:  1-4 = Minimal depression, 5-9 = Mild depression, 10-14 = Moderate depression, 15-19 = Moderately severe depression, 20-27 = Severe depression   Psychosocial Evaluation and Intervention:     Psychosocial Evaluation - 07/27/17 1802      Psychosocial Evaluation & Interventions   Interventions Encouraged to exercise with the program and follow exercise prescription   Comments no psychhosocial needs identified, no interventions necessary    Expected Outcomes pt will exhibit positive outlook with good coping skills.    Continue Psychosocial Services  No Follow up  required      Psychosocial Re-Evaluation:     Psychosocial Re-Evaluation    [place] Name 07/27/17 1803 08/23/17 0712           Psychosocial Re-Evaluation   Current issues with None Identified None Identified      Comments no psychosocial needs identified, no interventions necessary  no psychosocial needs identified, no interventions necessary       Expected Outcomes pt will exhibit positive outlook wtih good coping skills.  pt will exhibit positive outlook wtih good coping skills.       Interventions Encouraged to attend Cardiac Rehabilitation for the exercise;Stress management education;Relaxation education Encouraged to attend Cardiac Rehabilitation for the exercise;Stress management education;Relaxation education      Continue Psychosocial Services  No Follow up required No Follow up required         Psychosocial Discharge (Final Psychosocial Re-Evaluation):     Psychosocial Re-Evaluation - 08/23/17 0712      Psychosocial Re-Evaluation   Current issues with None Identified   Comments no psychosocial  needs identified, no interventions necessary    Expected Outcomes pt will exhibit positive outlook wtih good coping skills.    Interventions Encouraged to attend Cardiac Rehabilitation for the exercise;Stress management education;Relaxation education   Continue Psychosocial Services  No Follow up required      Vocational Rehabilitation: Provide vocational rehab assistance to qualifying candidates.   Vocational Rehab Evaluation & Intervention:   Education: Education Goals: Education classes will be provided on a weekly basis, covering required topics. Participant will state understanding/return demonstration of topics presented.  Learning Barriers/Preferences:     Learning Barriers/Preferences - 07/12/17 1452      Learning Barriers/Preferences   Learning Barriers Sight   Learning Preferences Pictoral;Video      Education Topics: Count Your Pulse:  -Group instruction  provided by verbal instruction, demonstration, patient participation and written materials to support subject.  Instructors address importance of being able to find your pulse and how to count your pulse when at home without a heart monitor.  Patients get hands on experience counting their pulse with staff help and individually.   CARDIAC REHAB PHASE II EXERCISE from 08/19/2017 in [place]  Date  07/22/17  Instruction Review Code  2- meets goals/outcomes      Heart Attack, Angina, and Risk Factor Modification:  -Group instruction provided by verbal instruction, video, and written materials to support subject.  Instructors address signs and symptoms of angina and heart attacks.    Also discuss risk factors for heart disease and how to make changes to improve heart health risk factors.   Functional Fitness:  -Group instruction provided by verbal instruction, demonstration, patient participation, and written materials to support subject.  Instructors address safety measures for doing things around the house.  Discuss how to get up and down off the floor, how to pick things up properly, how to safely get out of a chair without assistance, and balance training.   Meditation and Mindfulness:  -Group instruction provided by verbal instruction, patient participation, and written materials to support subject.  Instructor addresses importance of mindfulness and meditation practice to help reduce stress and improve awareness.  Instructor also leads participants through a meditation exercise.    CARDIAC REHAB PHASE II EXERCISE from 08/19/2017 in [place]  Date  08/17/17  Instruction Review Code  2- meets goals/outcomes      Stretching for Flexibility and Mobility:  -Group instruction provided by verbal instruction, patient participation, and written materials to support subject.  Instructors lead participants through series of stretches  that are designed to increase flexibility thus improving mobility.  These stretches are additional exercise for major muscle groups that are typically performed during regular warm up and cool down.   CARDIAC REHAB PHASE II EXERCISE from 08/19/2017 in [place]  Date  08/19/17  Educator  Seward Carol  Instruction Review Code  2- meets goals/outcomes      Hands Only CPR:  -Group verbal, video, and participation provides a basic overview of AHA guidelines for community CPR. Role-play of emergencies allow participants the opportunity to practice calling for help and chest compression technique with discussion of AED use.   Hypertension: -Group verbal and written instruction that provides a basic overview of hypertension including the most recent diagnostic guidelines, risk factor reduction with self-care instructions and medication management.    Nutrition I class: Heart Healthy Eating:  -Group instruction provided by PowerPoint slides, verbal discussion, and written materials to support  subject matter. The instructor gives an explanation and review of the Therapeutic Lifestyle Changes diet recommendations, which includes a discussion on lipid goals, dietary fat, sodium, fiber, plant stanol/sterol esters, sugar, and the components of a well-balanced, healthy diet.   CARDIAC REHAB PHASE II EXERCISE from 08/19/2017 in [place]  Date  07/19/17  Educator  RD  Instruction Review Code  2- meets goals/outcomes      Nutrition II class: Lifestyle Skills:  -Group instruction provided by PowerPoint slides, verbal discussion, and written materials to support subject matter. The instructor gives an explanation and review of label reading, grocery shopping for heart health, heart healthy recipe modifications, and ways to make healthier choices when eating out.   Diabetes Question & Answer:  -Group instruction provided by PowerPoint  slides, verbal discussion, and written materials to support subject matter. The instructor gives an explanation and review of diabetes co-morbidities, pre- and post-prandial blood glucose goals, pre-exercise blood glucose goals, signs, symptoms, and treatment of hypoglycemia and hyperglycemia, and foot care basics.   Diabetes Blitz:  -Group instruction provided by PowerPoint slides, verbal discussion, and written materials to support subject matter. The instructor gives an explanation and review of the physiology behind type 1 and type 2 diabetes, diabetes medications and rational behind using different medications, pre- and post-prandial blood glucose recommendations and Hemoglobin A1c goals, diabetes diet, and exercise including blood glucose guidelines for exercising safely.    Portion Distortion:  -Group instruction provided by PowerPoint slides, verbal discussion, written materials, and food models to support subject matter. The instructor gives an explanation of serving size versus portion size, changes in portions sizes over the last 20 years, and what consists of a serving from each food group.   Stress Management:  -Group instruction provided by verbal instruction, video, and written materials to support subject matter.  Instructors review role of stress in heart disease and how to cope with stress positively.     Exercising on Your Own:  -Group instruction provided by verbal instruction, power point, and written materials to support subject.  Instructors discuss benefits of exercise, components of exercise, frequency and intensity of exercise, and end points for exercise.  Also discuss use of nitroglycerin and activating EMS.  Review options of places to exercise outside of rehab.  Review guidelines for sex with heart disease.   CARDIAC REHAB PHASE II EXERCISE from 08/19/2017 in [place]  Date  07/20/17  Educator  EP  Instruction Review Code  2- meets  goals/outcomes      Cardiac Drugs I:  -Group instruction provided by verbal instruction and written materials to support subject.  Instructor reviews cardiac drug classes: antiplatelets, anticoagulants, beta blockers, and statins.  Instructor discusses reasons, side effects, and lifestyle considerations for each drug class.   CARDIAC REHAB PHASE II EXERCISE from 08/19/2017 in [place]  Date  07/27/17  Educator  pharmacy  Instruction Review Code  2- meets goals/outcomes      Cardiac Drugs II:  -Group instruction provided by verbal instruction and written materials to support subject.  Instructor reviews cardiac drug classes: angiotensin converting enzyme inhibitors (ACE-I), angiotensin II receptor blockers (ARBs), nitrates, and calcium channel blockers.  Instructor discusses reasons, side effects, and lifestyle considerations for each drug class.   Anatomy and Physiology of the Circulatory System:  Group verbal and written instruction and models provide basic cardiac anatomy and physiology, with the coronary electrical and arterial systems. Review  of: AMI, Angina, Valve disease, Heart Failure, Peripheral Artery Disease, Cardiac Arrhythmia, Pacemakers, and the ICD.   Other Education:  -Group or individual verbal, written, or video instructions that support the educational goals of the cardiac rehab program.   Knowledge Questionnaire Score:     Knowledge Questionnaire Score - 07/12/17 1412      Knowledge Questionnaire Score   Pre Score 22/24      Core Components/Risk Factors/Patient Goals at Admission:     Personal Goals and Risk Factors at Admission - 07/12/17 1433      Core Components/Risk Factors/Patient Goals on Admission    Weight Management Weight Loss;Yes   Intervention Obesity: Provide education and appropriate resources to help participant work on and attain dietary goals.;Weight Management/Obesity: Establish reasonable short term and  long term weight goals.;Weight Management: Provide education and appropriate resources to help participant work on and attain dietary goals.;Weight Management: Develop a combined nutrition and exercise program designed to reach desired caloric intake, while maintaining appropriate intake of nutrient and fiber, sodium and fats, and appropriate energy expenditure required for the weight goal.   Admit Weight 181 lb (82.1 kg)   Expected Outcomes Weight Loss: Understanding of general recommendations for a balanced deficit meal plan, which promotes 1-2 lb weight loss per week and includes a negative energy balance of [phone] kcal/d;Long Term: Adherence to nutrition and physical activity/exercise program aimed toward attainment of established weight goal;Short Term: Continue to assess and modify interventions until short term weight is achieved   Diabetes Yes   Intervention Provide education about signs/symptoms and action to take for hypo/hyperglycemia.;Provide education about proper nutrition, including hydration, and aerobic/resistive exercise prescription along with prescribed medications to achieve blood glucose in normal ranges: Fasting glucose 65-99 mg/dL   Expected Outcomes Short Term: Participant verbalizes understanding of the signs/symptoms and immediate care of hyper/hypoglycemia, proper foot care and importance of medication, aerobic/resistive exercise and nutrition plan for blood glucose control.;Long Term: Attainment of HbA1C < 7%.   Hypertension Yes   Intervention Provide education on lifestyle modifcations including regular physical activity/exercise, weight management, moderate sodium restriction and increased consumption of fresh fruit, vegetables, and low fat dairy, alcohol moderation, and smoking cessation.;Monitor prescription use compliance.   Expected Outcomes Short Term: Continued assessment and intervention until BP is < 140/54m HG in hypertensive participants. < 130/865mHG in  hypertensive participants with diabetes, heart failure or chronic kidney disease.;Long Term: Maintenance of blood pressure at goal levels.   Lipids Yes   Intervention Provide education and support for participant on nutrition & aerobic/resistive exercise along with prescribed medications to achieve LDL <7020mHDL >71m2m Expected Outcomes Short Term: Participant states understanding of desired cholesterol values and is compliant with medications prescribed. Participant is following exercise prescription and nutrition guidelines.;Long Term: Cholesterol controlled with medications as prescribed, with individualized exercise RX and with personalized nutrition plan. Value goals: LDL < 70mg64mL > 40 mg.      Core Components/Risk Factors/Patient Goals Review:      Goals and Risk Factor Review    Row Name 07/27/17 1759 08/23/17 0712 [id number]      Core Components/Risk Factors/Patient Goals Review   Personal Goals Review Weight Management/Obesity;Hypertension;Diabetes;Lipids Weight Management/Obesity;Hypertension;Diabetes;Lipids      Review pt with multiple RF demonstrates eagerness to participate in CR exercise,nutrition and education . pt with multiple RF demonstrates eagerness to participate in CR exercise,nutrition and education . pt c/o continued DOE with more than usual activity, however has noticed it  is easier to perform household chores.       Expected Outcomes pt will participate in CR exercise, nutrition and lifestyle modification to decrease overall RF.   pt will participate in CR exercise, nutrition and lifestyle modification to decrease overall RF.           Core Components/Risk Factors/Patient Goals at Discharge (Final Review):      Goals and Risk Factor Review - 08/23/17 0712      Core Components/Risk Factors/Patient Goals Review   Personal Goals Review Weight Management/Obesity;Hypertension;Diabetes;Lipids   Review pt with multiple RF demonstrates eagerness to participate in CR  exercise,nutrition and education . pt c/o continued DOE with more than usual activity, however has noticed it is easier to perform household chores.    Expected Outcomes pt will participate in CR exercise, nutrition and lifestyle modification to decrease overall RF.        ITP Comments:     ITP Comments    Row Name 07/12/17 1402 07/27/17 1756 08/23/17 0711 08/23/17 1434     ITP Comments Medical Director- Dr. Fransico Him, MD. Medical Director- Dr. Fransico Him, MD. 30 day ITP review.  pt with good participation and attendance. -       Comments:

## 2017-08-24 ENCOUNTER — Encounter (HOSPITAL_COMMUNITY)
Admission: RE | Admit: 2017-08-24 | Discharge: 2017-08-24 | Disposition: A | Discharge status: Home / Self Care | Payer: BC Managed Care – PPO | Source: Ambulatory Visit | Attending: Interventional Cardiology | Admitting: Interventional Cardiology

## 2017-08-24 DIAGNOSIS — Z955 Presence of coronary angioplasty implant and graft: Secondary | ICD-10-CM

## 2017-08-24 DIAGNOSIS — I213 ST elevation (STEMI) myocardial infarction of unspecified site: Secondary | ICD-10-CM

## 2017-08-24 LAB — GLUCOSE, CAPILLARY
GLUCOSE-CAPILLARY: 104 mg/dL — AB (ref 65–99)
GLUCOSE-CAPILLARY: 84 mg/dL (ref 65–99)
GLUCOSE-CAPILLARY: 101 mg/dL — AB (ref 65–99)

## 2017-08-24 NOTE — Progress Notes (Signed)
Cardiac Individual Treatment Plan  Patient Details  Name: Jocelyn [name] MRN: [id number] Date of Birth: 10-23-1956 Referring Provider:     CARDIAC REHAB PHASE II ORIENTATION from 07/12/2017 in [facility]  Referring Provider  Thamas Jaegers MD      Initial Encounter Date:    CARDIAC REHAB PHASE II ORIENTATION from 07/12/2017 in [facility]  Date  07/12/17  Referring Provider  Thamas Jaegers MD      Visit Diagnosis: 06/15/17 Status post coronary artery stent placement  06/15/17 ST elevation myocardial infarction (STEMI), unspecified artery ([place])  Patient's Home Medications on Admission:  Current Outpatient Prescriptions:  .  albuterol (PROVENTIL HFA;VENTOLIN HFA) 108 (90 BASE) MCG/ACT inhaler, Inhale 1-2 puffs into the lungs every 6 (six) hours as needed for wheezing or shortness of breath., Disp: 8 g, Rfl: 2 .  ALPRAZolam (XANAX) 0.5 MG tablet, Take 1 tablet by mouth daily at bedtime, Disp: 30 tablet, Rfl: 5 .  aspirin 81 MG chewable tablet, Chew 1 tablet (81 mg total) by mouth daily., Disp: , Rfl:  .  CARTIA XT 240 MG 24 hr capsule, take 1 capsule by mouth once daily, Disp: 90 capsule, Rfl: 3 .  Cyanocobalamin 1000 MCG/ML KIT, Inject 1,000 mg as directed every 30 (thirty) days., Disp: , Rfl:  .  Empagliflozin-Metformin HCl (SYNJARDY) 03-999 MG TABS, Take 1 tablet by mouth daily., Disp: , Rfl:  .  Evolocumab (REPATHA SURECLICK) 809 MG/ML SOAJ, Inject 1 pen into the skin every 14 (fourteen) days., Disp: 2 pen, Rfl: 11 .  fluticasone (FLONASE) 50 MCG/ACT nasal spray, Place 2 sprays into both nostrils at bedtime., Disp: 16 g, Rfl: 2 .  furosemide (LASIX) 20 MG tablet, Take 20 mg by mouth daily as needed for fluid or edema., Disp: , Rfl:  .  HP ACTHAR 80 UNIT/ML injectable gel, Inject 80 Units into the skin 3 (three) times a week. , Disp: , Rfl:  .  losartan (COZAAR) 100 MG tablet, take 1 tablet by mouth once daily, Disp: 30  tablet, Rfl: 10 .  metoprolol tartrate (LOPRESSOR) 50 MG tablet, Take 1 tablet (50 mg total) by mouth 2 (two) times daily., Disp: 60 tablet, Rfl: 5 .  mometasone-formoterol (DULERA) 100-5 MCG/ACT AERO, Inhale 2 puffs into the lungs 2 (two) times daily., Disp: , Rfl:  .  nitroGLYCERIN (NITROSTAT) 0.4 MG SL tablet, Place 1 tablet (0.4 mg total) under the tongue every 5 (five) minutes as needed for chest pain., Disp: 25 tablet, Rfl: 2 .  ondansetron (ZOFRAN-ODT) 4 MG disintegrating tablet, Take 4 mg by mouth every 8 (eight) hours as needed for nausea or vomiting., Disp: , Rfl:  .  pantoprazole (PROTONIX) 40 MG tablet, Take 1 tablet (40 mg total) by mouth daily., Disp: 30 tablet, Rfl: 11 .  ticagrelor (BRILINTA) 90 MG TABS tablet, Take 1 tablet (90 mg total) by mouth 2 (two) times daily., Disp: 180 tablet, Rfl: 3 .  traMADol (ULTRAM) 50 MG tablet, Take 1 tablet (50 mg total) by mouth every 6 (six) hours as needed for severe pain., Disp: 30 tablet, Rfl: 0 .  valACYclovir (VALTREX) 500 MG tablet, Take 500 mg by mouth daily. , Disp: , Rfl:   Past Medical History: Past Medical History:  Diagnosis Date  . Acute bronchitis   . Allergic rhinitis, cause unspecified   . Anemia   . Anxiety   . B12 deficiency   . BV (bacterial vaginosis) 06/22/1996  . Calculus  of kidney   . Calculus of kidney   . Coronary atherosclerosis of unspecified type of vessel, native or graft   . Dizziness   . Essential hypertension, benign   . Fibroid 2003  . Fibromyalgia   . H/O dysmenorrhea 2008  . H/O varicella   . Headache(784.0)    frequently  . HSV-2 infection 2009  . Hyperplastic colon polyp 05/16/2014  . Irritable bowel syndrome   . Meniere's disease, unspecified   . Menses, irregular 2003  . Myalgia and myositis, unspecified   . Obstructive sleep apnea (adult) (pediatric)   . Perimenopausal symptoms 2003  . Pure hypercholesterolemia   . Sarcoidosis   . Type II or unspecified type diabetes mellitus without  mention of complication, not stated as uncontrolled   . Unspecified venous (peripheral) insufficiency   . Vitamin D deficiency   . Vulvitis 2010  . Yeast infection     Tobacco Use: History  Smoking Status  . Never Smoker  Smokeless Tobacco  . Never Used    Comment: Daily Caffeine - 1  Exercise 2-3 times/weekly    Labs: Recent Review Flowsheet Data    Labs for ITP Cardiac and Pulmonary Rehab Latest Ref Rng & Units 08/14/2014 01/27/2015 06/01/2016 08/18/2016 06/15/2017   Cholestrol 0 - 200 mg/dL 167 181 296(H) 191 182   LDLCALC 0 - 99 mg/dL - 104(H) 202(H) 107 93   LDLDIRECT mg/dL 91.6 - - - -   HDL >40 mg/dL 46.90 44.40 53 41(L) 56   Trlycerides <150 mg/dL 215.0(H) 162.0(H) 205(H) 214(H) 163(H)   Hemoglobin A1c 4.6 - 6.5 % - - - - -   TCO2 0 - 100 mmol/L - - - - 22      Capillary Blood Glucose: Lab Results  Component Value Date   GLUCAP 101 (H) 08/24/2017   GLUCAP 84 08/24/2017   GLUCAP 104 (H) 08/24/2017   GLUCAP 134 (H) 08/22/2017   GLUCAP 123 (H) 08/19/2017     Exercise Target Goals:    Exercise Program Goal: Individual exercise prescription set with THRR, safety & activity barriers. Participant demonstrates ability to understand and report RPE using BORG scale, to self-measure pulse accurately, and to acknowledge the importance of the exercise prescription.  Exercise Prescription Goal: Starting with aerobic activity 30 plus minutes a day, 3 days per week for initial exercise prescription. Provide home exercise prescription and guidelines that participant acknowledges understanding prior to discharge.  Activity Barriers & Risk Stratification:     Activity Barriers & Cardiac Risk Stratification - 07/12/17 1452      Activity Barriers & Cardiac Risk Stratification   Activity Barriers Back Problems;Balance Concerns;Other (comment)   Comments B knee pain R>L   Cardiac Risk Stratification High      6 Minute Walk:     6 Minute Walk    Row Name 07/12/17 1429  07/12/17 1506 07/12/17 1513     6 Minute Walk   Phase Initial  -  -   Distance 1246 feet  -  -   Walk Time 6 minutes  -  -   # of Rest Breaks 0  -  -   MPH 2.4  -  -   METS 2.8  -  -   RPE 12  -  -   VO2 Peak 9.8  -  -   Symptoms  -  - Yes (comment)   Comments  -  - fatigue at end of walk test   Resting HR 89  bpm  -  -   Resting BP 110/78  -  -   Resting Oxygen Saturation  98 %  -  -   Exercise Oxygen Saturation  during 6 min walk 97 %  -  -   Max Ex. HR 101 bpm  -  -   Max Ex. BP 118/70  -  -   2 Minute Post BP  - 100/70  -      Oxygen Initial Assessment:   Oxygen Re-Evaluation:   Oxygen Discharge (Final Oxygen Re-Evaluation):   Initial Exercise Prescription:     Initial Exercise Prescription - 07/12/17 1500      Date of Initial Exercise RX and Referring Provider   Date 07/12/17   Referring Provider Thamas Jaegers MD     Treadmill   MPH 2.2   Grade 0   Minutes 10   METs 2.68     Recumbant Bike   Level 2   Watts 10   Minutes 10   METs 2.38     NuStep   Level 2   SPM 70   Minutes 10   METs 2     Track   Laps 8   Minutes 10   METs 2.39     Prescription Details   Frequency (times per week) 3   Duration Progress to 30 minutes of continuous aerobic without signs/symptoms of physical distress     Intensity   THRR 40-80% of Max Heartrate 63-126   Ratings of Perceived Exertion 11-13   Perceived Dyspnea 0-4     Progression   Progression Continue to progress workloads to maintain intensity without signs/symptoms of physical distress.     Resistance Training   Training Prescription Yes   Weight 2lbs   Reps 10-15      Perform Capillary Blood Glucose checks as needed.  Exercise Prescription Changes:     Exercise Prescription Changes    Row Name 07/20/17 1704 07/25/17 1700 08/08/17 1625 08/23/17 1600       Response to Exercise   Blood Pressure (Admit) 122/76 116/70 130/72 112/70    Blood Pressure (Exercise) 140/82 140/70 140/70 160/80     Blood Pressure (Exit) 122/80 110/70 112/60 126/80    Heart Rate (Admit) 72 bpm 88 bpm 99 bpm 91 bpm    Heart Rate (Exercise) 91 bpm 110 bpm 113 bpm 98 bpm    Heart Rate (Exit) 72 bpm 82 bpm 88 bpm 72 bpm    Rating of Perceived Exertion (Exercise) _0 Symptoms none c/o lightheadedness after walking on treadmill none none    Comments pt was oriented to exercise equipment discussed slowing down speed vs completely stopping on TM  -  -    Duration Continue with 30 min of aerobic exercise without signs/symptoms of physical distress. Continue with 30 min of aerobic exercise without signs/symptoms of physical distress. Continue with 30 min of aerobic exercise without signs/symptoms of physical distress. Continue with 30 min of aerobic exercise without signs/symptoms of physical distress.    Intensity THRR unchanged THRR unchanged THRR unchanged THRR unchanged      Progression   Progression Continue to progress workloads to maintain intensity without signs/symptoms of physical distress. Continue to progress workloads to maintain intensity without signs/symptoms of physical distress. Continue to progress workloads to maintain intensity without signs/symptoms of physical distress. Continue to progress workloads to maintain intensity without signs/symptoms of physical distress.    Average METs 1.6 2.3 2.5 2.5  Resistance Training   Training Prescription Yes Yes Yes Yes    Weight 2lbs 2lbs 2lbs 2lbs    Reps 10-15 10-15 10-15 10-15    Time 10 Minutes 10 Minutes 10 Minutes 10 Minutes      Treadmill   MPH  - 2.2 2.2  -    Grade  - 0 2  -    Minutes  - 15 15  -    METs  - 2.68 3.29  -      Recumbant Bike   Level  -  -  - 2    Watts  -  -  - 16    Minutes  -  -  - 15    METs  -  -  - 2.6      NuStep   Level _0 SPM 60 70 70 80    Minutes _1 METs 1.6 1.8 1.9 2.4      Home Exercise Plan   Plans to continue exercise at  -  - Home (comment)  walking and going  to [facility] (comment)  walk or go to [facility]    Frequency  -  - Add 2 additional days to program exercise sessions. Add 2 additional days to program exercise sessions.    Initial Home Exercises Provided  -  - 08/05/17 08/05/17       Exercise Comments:     Exercise Comments    Row Name 07/26/17 1435 08/23/17 1624         Exercise Comments Pt has completed 3 exercise sessions in cardiac, to soon to evaluate goals but pt is making great progress. Reviewed METs and goals. Pt is tolerating exercise well; will continue to monitor pt's progress and activity levels.          Exercise Goals and Review:     Exercise Goals    Row Name 07/12/17 1507 07/12/17 1508           Exercise Goals   Increase Physical Activity Yes  -      Intervention Provide advice, education, support and counseling about physical activity/exercise needs.;Develop an individualized exercise prescription for aerobic and resistive training based on initial evaluation findings, risk stratification, comorbidities and participant's personal goals.  -      Expected Outcomes Achievement of increased cardiorespiratory fitness and enhanced flexibility, muscular endurance and strength shown through measurements of functional capacity and personal statement of participant.  -      Increase Strength and Stamina Yes  -      Intervention Provide advice, education, support and counseling about physical activity/exercise needs.;Develop an individualized exercise prescription for aerobic and resistive training based on initial evaluation findings, risk stratification, comorbidities and participant's personal goals.  -      Expected Outcomes Achievement of increased cardiorespiratory fitness and enhanced flexibility, muscular endurance and strength shown through measurements of functional capacity and personal statement of participant.  -      Able to understand and use rate of perceived exertion (RPE) scale  - Yes       Intervention  - Provide education and explanation on how to use RPE scale      Expected Outcomes  - Long Term:  Able to use RPE to guide intensity level when exercising independently;Short Term: Able to use RPE daily in rehab to express subjective intensity level      Knowledge and understanding of  Target Heart Rate Range (THRR)  - Yes      Intervention  - Provide education and explanation of THRR including how the numbers were predicted and where they are located for reference      Expected Outcomes  - Short Term: Able to state/look up THRR;Short Term: Able to use daily as guideline for intensity in rehab;Long Term: Able to use THRR to govern intensity when exercising independently      Able to check pulse independently  - Yes      Understanding of Exercise Prescription  - Yes      Intervention  - Provide education, explanation, and written materials on patient's individual exercise prescription      Expected Outcomes  - Long Term: Able to explain home exercise prescription to exercise independently;Short Term: Able to explain program exercise prescription         Exercise Goals Re-Evaluation :     Exercise Goals Re-Evaluation    Row Name 07/26/17 1436 08/05/17 1742 08/23/17 1628         Exercise Goal Re-Evaluation   Exercise Goals Review Increase Physical Activity;Able to understand and use rate of perceived exertion (RPE) scale;Knowledge and understanding of Target Heart Rate Range (THRR);Understanding of Exercise Prescription;Increase Strength and Stamina Increase Physical Activity;Able to understand and use rate of perceived exertion (RPE) scale;Knowledge and understanding of Target Heart Rate Range (THRR);Understanding of Exercise Prescription;Increase Strength and Stamina;Able to check pulse independently Increase Physical Activity;Able to understand and use rate of perceived exertion (RPE) scale;Knowledge and understanding of Target Heart Rate Range (THRR);Understanding of Exercise  Prescription;Increase Strength and Stamina;Able to check pulse independently     Comments Pt is exercising for 30 minutes and is doing well. Pt did report dizziness after treadmill. Pt abruptly stopped machine, express to pt to slow speed down and come to a stop slowly to avoid abrupt stop and dizziness. Reviewed home exercise with pt today.  Pt plans to walk or go to [facility] for exercise.  Pt plans to exercise 2x/week in addition to coming got cardiac rehab. Reviewed THR, pulse, RPE, sign and symptoms, NTG use, and when to call 911 or MD.  Also discussed weather considerations and indoor options.  Pt voiced understanding. Pt is up to 30 minutes of walking at home and notice that stamina and energy levels are improving. Pt did stated having some mild SOB with walking from time to time. Discussed slowing down pace, temperature precautions, and consider taking rest breaks if need be.     Expected Outcomes Pt will be able to use treadmill safely and feel more confident with using treadmill machine or change to walking track.  Pt will be compliant with HEP and improve in cardiorespiratory fitness. Pt will be compliant with HEP and improve in cardiorespiratory fitness.         Discharge Exercise Prescription (Final Exercise Prescription Changes):     Exercise Prescription Changes - 08/23/17 1600      Response to Exercise   Blood Pressure (Admit) 112/70   Blood Pressure (Exercise) 160/80   Blood Pressure (Exit) 126/80   Heart Rate (Admit) 91 bpm   Heart Rate (Exercise) 98 bpm   Heart Rate (Exit) 72 bpm   Rating of Perceived Exertion (Exercise) 11   Symptoms none   Duration Continue with 30 min of aerobic exercise without signs/symptoms of physical distress.   Intensity THRR unchanged     Progression   Progression Continue to progress workloads to maintain intensity without signs/symptoms  of physical distress.   Average METs 2.5     Resistance Training   Training Prescription Yes   Weight  2lbs   Reps 10-15   Time 10 Minutes     Recumbant Bike   Level 2   Watts 16   Minutes 15   METs 2.6     NuStep   Level 4   SPM 80   Minutes 15   METs 2.4     Home Exercise Plan   Plans to continue exercise at Home (comment)  walk or go to [facility]   Frequency Add 2 additional days to program exercise sessions.   Initial Home Exercises Provided 08/05/17      Nutrition:  Target Goals: Understanding of nutrition guidelines, daily intake of sodium <1517m, cholesterol <2056m calories 30% from fat and 7% or less from saturated fats, daily to have 5 or more servings of fruits and vegetables.  Biometrics:     Pre Biometrics - 07/12/17 1512      Pre Biometrics   % Body Fat 32.2 %   Flexibility 0 in       Nutrition Therapy Plan and Nutrition Goals:     Nutrition Therapy & Goals - 07/13/17 1118      Nutrition Therapy   Diet Carb Modified, Therapeutic Lifestyle Changes     Personal Nutrition Goals   Nutrition Goal Pt to identify and limit food sources of saturated fat, trans fat, and sodium   Personal Goal #2 Pt to identify food quantities necessary to achieve weight loss of 6-24 lb (2.7-10.9 kg) at graduation from cardiac rehab.      Intervention Plan   Intervention Prescribe, educate and counsel regarding individualized specific dietary modifications aiming towards targeted core components such as weight, hypertension, lipid management, diabetes, heart failure and other comorbidities.   Expected Outcomes Short Term Goal: Understand basic principles of dietary content, such as calories, fat, sodium, cholesterol and nutrients.;Long Term Goal: Adherence to prescribed nutrition plan.      Nutrition Discharge: Nutrition Scores:     Nutrition Assessments - 07/13/17 1118      MEDFICTS Scores   Pre Score 44      Nutrition Goals Re-Evaluation:   Nutrition Goals Re-Evaluation:   Nutrition Goals Discharge (Final Nutrition Goals  Re-Evaluation):   Psychosocial: Target Goals: Acknowledge presence or absence of significant depression and/or stress, maximize coping skills, provide positive support system. Participant is able to verbalize types and ability to use techniques and skills needed for reducing stress and depression.  Initial Review & Psychosocial Screening:     Initial Psych Review & Screening - 07/12/17 1556      Initial Review   Current issues with None Identified     Family Dynamics   Good Support System? Yes  family    Comments upon brief assessment, no psychosocial needs identified, no interventions necessary      Barriers   Psychosocial barriers to participate in program There are no identifiable barriers or psychosocial needs.     Screening Interventions   Interventions Encouraged to exercise      Quality of Life Scores:     Quality of Life - 07/12/17 1412      Quality of Life Scores   Health/Function Pre 19.85 %   Socioeconomic Pre 24.75 %   Psych/Spiritual Pre 23.93 %   Family Pre 28.5 %   GLOBAL Pre 22.93 %      PHQ-9: Recent Review Flowsheet Data    Depression  screen [facility] 2/9 07/20/2017 12/03/2014   Decreased Interest 0 0   Down, Depressed, Hopeless 0 0   PHQ - 2 Score 0 0     Interpretation of Total Score  Total Score Depression Severity:  1-4 = Minimal depression, 5-9 = Mild depression, 10-14 = Moderate depression, 15-19 = Moderately severe depression, 20-27 = Severe depression   Psychosocial Evaluation and Intervention:     Psychosocial Evaluation - 07/27/17 1802      Psychosocial Evaluation & Interventions   Interventions Encouraged to exercise with the program and follow exercise prescription   Comments no psychhosocial needs identified, no interventions necessary    Expected Outcomes pt will exhibit positive outlook with good coping skills.    Continue Psychosocial Services  No Follow up required      Psychosocial Re-Evaluation:     Psychosocial  Re-Evaluation    [place] Name 07/27/17 1803 08/23/17 0712           Psychosocial Re-Evaluation   Current issues with None Identified None Identified      Comments no psychosocial needs identified, no interventions necessary  no psychosocial needs identified, no interventions necessary       Expected Outcomes pt will exhibit positive outlook wtih good coping skills.  pt will exhibit positive outlook wtih good coping skills.       Interventions Encouraged to attend Cardiac Rehabilitation for the exercise;Stress management education;Relaxation education Encouraged to attend Cardiac Rehabilitation for the exercise;Stress management education;Relaxation education      Continue Psychosocial Services  No Follow up required No Follow up required         Psychosocial Discharge (Final Psychosocial Re-Evaluation):     Psychosocial Re-Evaluation - 08/23/17 0712      Psychosocial Re-Evaluation   Current issues with None Identified   Comments no psychosocial needs identified, no interventions necessary    Expected Outcomes pt will exhibit positive outlook wtih good coping skills.    Interventions Encouraged to attend Cardiac Rehabilitation for the exercise;Stress management education;Relaxation education   Continue Psychosocial Services  No Follow up required      Vocational Rehabilitation: Provide vocational rehab assistance to qualifying candidates.   Vocational Rehab Evaluation & Intervention:   Education: Education Goals: Education classes will be provided on a weekly basis, covering required topics. Participant will state understanding/return demonstration of topics presented.  Learning Barriers/Preferences:     Learning Barriers/Preferences - 07/12/17 1452      Learning Barriers/Preferences   Learning Barriers Sight   Learning Preferences Pictoral;Video      Education Topics: Count Your Pulse:  -Group instruction provided by verbal instruction, demonstration, patient  participation and written materials to support subject.  Instructors address importance of being able to find your pulse and how to count your pulse when at home without a heart monitor.  Patients get hands on experience counting their pulse with staff help and individually.   CARDIAC REHAB PHASE II EXERCISE from 08/19/2017 in [facility]  Date  07/22/17  Instruction Review Code  2- meets goals/outcomes      Heart Attack, Angina, and Risk Factor Modification:  -Group instruction provided by verbal instruction, video, and written materials to support subject.  Instructors address signs and symptoms of angina and heart attacks.    Also discuss risk factors for heart disease and how to make changes to improve heart health risk factors.   Functional Fitness:  -Group instruction provided by verbal instruction, demonstration, patient participation, and written materials to  support subject.  Instructors address safety measures for doing things around the house.  Discuss how to get up and down off the floor, how to pick things up properly, how to safely get out of a chair without assistance, and balance training.   Meditation and Mindfulness:  -Group instruction provided by verbal instruction, patient participation, and written materials to support subject.  Instructor addresses importance of mindfulness and meditation practice to help reduce stress and improve awareness.  Instructor also leads participants through a meditation exercise.    CARDIAC REHAB PHASE II EXERCISE from 08/19/2017 in [place]  Date  08/17/17  Instruction Review Code  2- meets goals/outcomes      Stretching for Flexibility and Mobility:  -Group instruction provided by verbal instruction, patient participation, and written materials to support subject.  Instructors lead participants through series of stretches that are designed to increase flexibility thus improving  mobility.  These stretches are additional exercise for major muscle groups that are typically performed during regular warm up and cool down.   CARDIAC REHAB PHASE II EXERCISE from 08/19/2017 in [place]  Date  08/19/17  Educator  Seward Carol  Instruction Review Code  2- meets goals/outcomes      Hands Only CPR:  -Group verbal, video, and participation provides a basic overview of AHA guidelines for community CPR. Role-play of emergencies allow participants the opportunity to practice calling for help and chest compression technique with discussion of AED use.   Hypertension: -Group verbal and written instruction that provides a basic overview of hypertension including the most recent diagnostic guidelines, risk factor reduction with self-care instructions and medication management.    Nutrition I class: Heart Healthy Eating:  -Group instruction provided by PowerPoint slides, verbal discussion, and written materials to support subject matter. The instructor gives an explanation and review of the Therapeutic Lifestyle Changes diet recommendations, which includes a discussion on lipid goals, dietary fat, sodium, fiber, plant stanol/sterol esters, sugar, and the components of a well-balanced, healthy diet.   CARDIAC REHAB PHASE II EXERCISE from 08/19/2017 in [place]  Date  07/19/17  Educator  RD  Instruction Review Code  2- meets goals/outcomes      Nutrition II class: Lifestyle Skills:  -Group instruction provided by PowerPoint slides, verbal discussion, and written materials to support subject matter. The instructor gives an explanation and review of label reading, grocery shopping for heart health, heart healthy recipe modifications, and ways to make healthier choices when eating out.   Diabetes Question & Answer:  -Group instruction provided by PowerPoint slides, verbal discussion, and written materials to support  subject matter. The instructor gives an explanation and review of diabetes co-morbidities, pre- and post-prandial blood glucose goals, pre-exercise blood glucose goals, signs, symptoms, and treatment of hypoglycemia and hyperglycemia, and foot care basics.   Diabetes Blitz:  -Group instruction provided by PowerPoint slides, verbal discussion, and written materials to support subject matter. The instructor gives an explanation and review of the physiology behind type 1 and type 2 diabetes, diabetes medications and rational behind using different medications, pre- and post-prandial blood glucose recommendations and Hemoglobin A1c goals, diabetes diet, and exercise including blood glucose guidelines for exercising safely.    Portion Distortion:  -Group instruction provided by PowerPoint slides, verbal discussion, written materials, and food models to support subject matter. The instructor gives an explanation of serving size versus portion size, changes in portions sizes over the last 20  years, and what consists of a serving from each food group.   Stress Management:  -Group instruction provided by verbal instruction, video, and written materials to support subject matter.  Instructors review role of stress in heart disease and how to cope with stress positively.     Exercising on Your Own:  -Group instruction provided by verbal instruction, power point, and written materials to support subject.  Instructors discuss benefits of exercise, components of exercise, frequency and intensity of exercise, and end points for exercise.  Also discuss use of nitroglycerin and activating EMS.  Review options of places to exercise outside of rehab.  Review guidelines for sex with heart disease.   CARDIAC REHAB PHASE II EXERCISE from 08/19/2017 in [place]  Date  07/20/17  Educator  EP  Instruction Review Code  2- meets goals/outcomes      Cardiac Drugs I:  -Group instruction  provided by verbal instruction and written materials to support subject.  Instructor reviews cardiac drug classes: antiplatelets, anticoagulants, beta blockers, and statins.  Instructor discusses reasons, side effects, and lifestyle considerations for each drug class.   CARDIAC REHAB PHASE II EXERCISE from 08/19/2017 in [place]  Date  07/27/17  Educator  pharmacy  Instruction Review Code  2- meets goals/outcomes      Cardiac Drugs II:  -Group instruction provided by verbal instruction and written materials to support subject.  Instructor reviews cardiac drug classes: angiotensin converting enzyme inhibitors (ACE-I), angiotensin II receptor blockers (ARBs), nitrates, and calcium channel blockers.  Instructor discusses reasons, side effects, and lifestyle considerations for each drug class.   Anatomy and Physiology of the Circulatory System:  Group verbal and written instruction and models provide basic cardiac anatomy and physiology, with the coronary electrical and arterial systems. Review of: AMI, Angina, Valve disease, Heart Failure, Peripheral Artery Disease, Cardiac Arrhythmia, Pacemakers, and the ICD.   Other Education:  -Group or individual verbal, written, or video instructions that support the educational goals of the cardiac rehab program.   Knowledge Questionnaire Score:     Knowledge Questionnaire Score - 07/12/17 1412      Knowledge Questionnaire Score   Pre Score 22/24      Core Components/Risk Factors/Patient Goals at Admission:     Personal Goals and Risk Factors at Admission - 07/12/17 1433      Core Components/Risk Factors/Patient Goals on Admission    Weight Management Weight Loss;Yes   Intervention Obesity: Provide education and appropriate resources to help participant work on and attain dietary goals.;Weight Management/Obesity: Establish reasonable short term and long term weight goals.;Weight Management: Provide education  and appropriate resources to help participant work on and attain dietary goals.;Weight Management: Develop a combined nutrition and exercise program designed to reach desired caloric intake, while maintaining appropriate intake of nutrient and fiber, sodium and fats, and appropriate energy expenditure required for the weight goal.   Admit Weight 181 lb (82.1 kg)   Expected Outcomes Weight Loss: Understanding of general recommendations for a balanced deficit meal plan, which promotes 1-2 lb weight loss per week and includes a negative energy balance of [phone] kcal/d;Long Term: Adherence to nutrition and physical activity/exercise program aimed toward attainment of established weight goal;Short Term: Continue to assess and modify interventions until short term weight is achieved   Diabetes Yes   Intervention Provide education about signs/symptoms and action to take for hypo/hyperglycemia.;Provide education about proper nutrition, including hydration, and aerobic/resistive exercise prescription along with prescribed medications  to achieve blood glucose in normal ranges: Fasting glucose 65-99 mg/dL   Expected Outcomes Short Term: Participant verbalizes understanding of the signs/symptoms and immediate care of hyper/hypoglycemia, proper foot care and importance of medication, aerobic/resistive exercise and nutrition plan for blood glucose control.;Long Term: Attainment of HbA1C < 7%.   Hypertension Yes   Intervention Provide education on lifestyle modifcations including regular physical activity/exercise, weight management, moderate sodium restriction and increased consumption of fresh fruit, vegetables, and low fat dairy, alcohol moderation, and smoking cessation.;Monitor prescription use compliance.   Expected Outcomes Short Term: Continued assessment and intervention until BP is < 140/44m HG in hypertensive participants. < 130/868mHG in hypertensive participants with diabetes, heart failure or chronic  kidney disease.;Long Term: Maintenance of blood pressure at goal levels.   Lipids Yes   Intervention Provide education and support for participant on nutrition & aerobic/resistive exercise along with prescribed medications to achieve LDL <7076mHDL >30m4m Expected Outcomes Short Term: Participant states understanding of desired cholesterol values and is compliant with medications prescribed. Participant is following exercise prescription and nutrition guidelines.;Long Term: Cholesterol controlled with medications as prescribed, with individualized exercise RX and with personalized nutrition plan. Value goals: LDL < 70mg81mL > 40 mg.      Core Components/Risk Factors/Patient Goals Review:      Goals and Risk Factor Review    Row Name 07/27/17 1759 08/23/17 0712 0221      Core Components/Risk Factors/Patient Goals Review   Personal Goals Review Weight Management/Obesity;Hypertension;Diabetes;Lipids Weight Management/Obesity;Hypertension;Diabetes;Lipids      Review pt with multiple RF demonstrates eagerness to participate in CR exercise,nutrition and education . pt with multiple RF demonstrates eagerness to participate in CR exercise,nutrition and education . pt c/o continued DOE with more than usual activity, however has noticed it is easier to perform household chores.       Expected Outcomes pt will participate in CR exercise, nutrition and lifestyle modification to decrease overall RF.   pt will participate in CR exercise, nutrition and lifestyle modification to decrease overall RF.           Core Components/Risk Factors/Patient Goals at Discharge (Final Review):      Goals and Risk Factor Review - 08/23/17 0712      Core Components/Risk Factors/Patient Goals Review   Personal Goals Review Weight Management/Obesity;Hypertension;Diabetes;Lipids   Review pt with multiple RF demonstrates eagerness to participate in CR exercise,nutrition and education . pt c/o continued DOE with more  than usual activity, however has noticed it is easier to perform household chores.    Expected Outcomes pt will participate in CR exercise, nutrition and lifestyle modification to decrease overall RF.        ITP Comments:     ITP Comments    Row Name 07/12/17 1402 07/27/17 1756 08/23/17 0711 08/23/17 1434     ITP Comments Medical Director- Dr. TraciFransico Him Medical Director- Dr. TraciFransico Him 30 day ITP review.  pt with good participation and attendance. -       Comments:

## 2017-08-26 ENCOUNTER — Encounter (HOSPITAL_COMMUNITY)
Admission: RE | Admit: 2017-08-26 | Discharge: 2017-08-26 | Disposition: A | Discharge status: Home / Self Care | Payer: BC Managed Care – PPO | Source: Ambulatory Visit | Attending: Interventional Cardiology | Admitting: Interventional Cardiology

## 2017-08-26 DIAGNOSIS — Z955 Presence of coronary angioplasty implant and graft: Secondary | ICD-10-CM

## 2017-08-26 DIAGNOSIS — I213 ST elevation (STEMI) myocardial infarction of unspecified site: Secondary | ICD-10-CM

## 2017-08-29 ENCOUNTER — Encounter (HOSPITAL_COMMUNITY)
Admission: RE | Admit: 2017-08-29 | Discharge: 2017-08-29 | Disposition: A | Discharge status: Home / Self Care | Payer: BC Managed Care – PPO | Source: Ambulatory Visit | Attending: Interventional Cardiology | Admitting: Interventional Cardiology

## 2017-08-29 DIAGNOSIS — I213 ST elevation (STEMI) myocardial infarction of unspecified site: Secondary | ICD-10-CM

## 2017-08-29 DIAGNOSIS — Z955 Presence of coronary angioplasty implant and graft: Secondary | ICD-10-CM

## 2017-08-29 LAB — GLUCOSE, CAPILLARY: Glucose-Capillary: 132 mg/dL — ABNORMAL HIGH (ref 65–99)

## 2017-08-31 ENCOUNTER — Encounter (HOSPITAL_COMMUNITY)
Admission: RE | Admit: 2017-08-31 | Discharge: 2017-08-31 | Disposition: A | Discharge status: Home / Self Care | Payer: BC Managed Care – PPO | Source: Ambulatory Visit | Attending: Interventional Cardiology | Admitting: Interventional Cardiology

## 2017-08-31 DIAGNOSIS — Z955 Presence of coronary angioplasty implant and graft: Secondary | ICD-10-CM | POA: Diagnosis not present

## 2017-08-31 DIAGNOSIS — I213 ST elevation (STEMI) myocardial infarction of unspecified site: Secondary | ICD-10-CM

## 2017-08-31 LAB — GLUCOSE, CAPILLARY: Glucose-Capillary: 101 mg/dL — ABNORMAL HIGH (ref 65–99)

## 2017-09-02 ENCOUNTER — Encounter (HOSPITAL_COMMUNITY)
Admission: RE | Admit: 2017-09-02 | Discharge: 2017-09-02 | Disposition: A | Discharge status: Home / Self Care | Payer: BC Managed Care – PPO | Source: Ambulatory Visit | Attending: Interventional Cardiology | Admitting: Interventional Cardiology

## 2017-09-02 DIAGNOSIS — Z955 Presence of coronary angioplasty implant and graft: Secondary | ICD-10-CM | POA: Diagnosis not present

## 2017-09-02 DIAGNOSIS — I213 ST elevation (STEMI) myocardial infarction of unspecified site: Secondary | ICD-10-CM

## 2017-09-05 ENCOUNTER — Encounter (HOSPITAL_COMMUNITY)
Admission: RE | Admit: 2017-09-05 | Discharge: 2017-09-05 | Disposition: A | Discharge status: Home / Self Care | Payer: BC Managed Care – PPO | Source: Ambulatory Visit | Attending: Interventional Cardiology | Admitting: Interventional Cardiology

## 2017-09-05 DIAGNOSIS — I213 ST elevation (STEMI) myocardial infarction of unspecified site: Secondary | ICD-10-CM

## 2017-09-05 DIAGNOSIS — Z955 Presence of coronary angioplasty implant and graft: Secondary | ICD-10-CM

## 2017-09-07 ENCOUNTER — Encounter (HOSPITAL_COMMUNITY)
Admission: RE | Admit: 2017-09-07 | Discharge: 2017-09-07 | Disposition: A | Discharge status: Home / Self Care | Payer: BC Managed Care – PPO | Source: Ambulatory Visit | Attending: Interventional Cardiology | Admitting: Interventional Cardiology

## 2017-09-07 DIAGNOSIS — I213 ST elevation (STEMI) myocardial infarction of unspecified site: Secondary | ICD-10-CM

## 2017-09-07 DIAGNOSIS — Z955 Presence of coronary angioplasty implant and graft: Secondary | ICD-10-CM

## 2017-09-09 ENCOUNTER — Encounter (HOSPITAL_COMMUNITY)
Admission: RE | Admit: 2017-09-09 | Discharge: 2017-09-09 | Disposition: A | Discharge status: Home / Self Care | Payer: BC Managed Care – PPO | Source: Ambulatory Visit | Attending: Interventional Cardiology | Admitting: Interventional Cardiology

## 2017-09-09 DIAGNOSIS — Z955 Presence of coronary angioplasty implant and graft: Secondary | ICD-10-CM

## 2017-09-09 DIAGNOSIS — I213 ST elevation (STEMI) myocardial infarction of unspecified site: Secondary | ICD-10-CM

## 2017-09-12 ENCOUNTER — Encounter (HOSPITAL_COMMUNITY)
Admission: RE | Admit: 2017-09-12 | Discharge: 2017-09-12 | Disposition: A | Discharge status: Home / Self Care | Payer: BC Managed Care – PPO | Source: Ambulatory Visit | Attending: Interventional Cardiology | Admitting: Interventional Cardiology

## 2017-09-12 DIAGNOSIS — Z955 Presence of coronary angioplasty implant and graft: Secondary | ICD-10-CM | POA: Diagnosis not present

## 2017-09-12 DIAGNOSIS — I213 ST elevation (STEMI) myocardial infarction of unspecified site: Secondary | ICD-10-CM

## 2017-09-14 ENCOUNTER — Encounter (HOSPITAL_COMMUNITY)
Admission: RE | Admit: 2017-09-14 | Discharge: 2017-09-14 | Disposition: A | Discharge status: Home / Self Care | Payer: BC Managed Care – PPO | Source: Ambulatory Visit | Attending: Interventional Cardiology | Admitting: Interventional Cardiology

## 2017-09-14 DIAGNOSIS — Z955 Presence of coronary angioplasty implant and graft: Secondary | ICD-10-CM | POA: Diagnosis not present

## 2017-09-14 DIAGNOSIS — I213 ST elevation (STEMI) myocardial infarction of unspecified site: Secondary | ICD-10-CM

## 2017-09-16 ENCOUNTER — Encounter (HOSPITAL_COMMUNITY)
Admission: RE | Admit: 2017-09-16 | Discharge: 2017-09-16 | Disposition: A | Discharge status: Home / Self Care | Payer: BC Managed Care – PPO | Source: Ambulatory Visit | Attending: Interventional Cardiology | Admitting: Interventional Cardiology

## 2017-09-16 DIAGNOSIS — Z955 Presence of coronary angioplasty implant and graft: Secondary | ICD-10-CM

## 2017-09-16 DIAGNOSIS — I213 ST elevation (STEMI) myocardial infarction of unspecified site: Secondary | ICD-10-CM

## 2017-09-16 DIAGNOSIS — I2111 ST elevation (STEMI) myocardial infarction involving right coronary artery: Secondary | ICD-10-CM | POA: Insufficient documentation

## 2017-09-16 DIAGNOSIS — Z48812 Encounter for surgical aftercare following surgery on the circulatory system: Secondary | ICD-10-CM | POA: Diagnosis not present

## 2017-09-16 NOTE — Progress Notes (Signed)
Cardiac Individual Treatment Plan  Patient Details  Name: Jocelyn [name] MRN: [id number] Date of Birth: 1956/05/22 Referring Provider:     CARDIAC REHAB PHASE II ORIENTATION from 07/12/2017 in [place]  Referring Provider  Thamas Jaegers MD      Initial Encounter Date:    CARDIAC REHAB PHASE II ORIENTATION from 07/12/2017 in [place]  Date  07/12/17  Referring Provider  Thamas Jaegers MD      Visit Diagnosis: No diagnosis found.  Patient's Home Medications on Admission:  Current Outpatient Prescriptions:  .  albuterol (PROVENTIL HFA;VENTOLIN HFA) 108 (90 BASE) MCG/ACT inhaler, Inhale 1-2 puffs into the lungs every 6 (six) hours as needed for wheezing or shortness of breath., Disp: 8 g, Rfl: 2 .  ALPRAZolam (XANAX) 0.5 MG tablet, Take 1 tablet by mouth daily at bedtime, Disp: 30 tablet, Rfl: 5 .  aspirin 81 MG chewable tablet, Chew 1 tablet (81 mg total) by mouth daily., Disp: , Rfl:  .  CARTIA XT 240 MG 24 hr capsule, take 1 capsule by mouth once daily, Disp: 90 capsule, Rfl: 3 .  Cyanocobalamin 1000 MCG/ML KIT, Inject 1,000 mg as directed every 30 (thirty) days., Disp: , Rfl:  .  Empagliflozin-Metformin HCl (SYNJARDY) 03-999 MG TABS, Take 1 tablet by mouth daily., Disp: , Rfl:  .  Evolocumab (REPATHA SURECLICK) 482 MG/ML SOAJ, Inject 1 pen into the skin every 14 (fourteen) days., Disp: 2 pen, Rfl: 11 .  fluticasone (FLONASE) 50 MCG/ACT nasal spray, Place 2 sprays into both nostrils at bedtime., Disp: 16 g, Rfl: 2 .  furosemide (LASIX) 20 MG tablet, Take 20 mg by mouth daily as needed for fluid or edema., Disp: , Rfl:  .  HP ACTHAR 80 UNIT/ML injectable gel, Inject 80 Units into the skin 3 (three) times a week. , Disp: , Rfl:  .  losartan (COZAAR) 100 MG tablet, take 1 tablet by mouth once daily, Disp: 30 tablet, Rfl: 10 .  metoprolol tartrate (LOPRESSOR) 50 MG tablet, Take 1 tablet (50 mg total) by mouth 2  (two) times daily., Disp: 60 tablet, Rfl: 5 .  mometasone-formoterol (DULERA) 100-5 MCG/ACT AERO, Inhale 2 puffs into the lungs 2 (two) times daily., Disp: , Rfl:  .  nitroGLYCERIN (NITROSTAT) 0.4 MG SL tablet, Place 1 tablet (0.4 mg total) under the tongue every 5 (five) minutes as needed for chest pain., Disp: 25 tablet, Rfl: 2 .  ondansetron (ZOFRAN-ODT) 4 MG disintegrating tablet, Take 4 mg by mouth every 8 (eight) hours as needed for nausea or vomiting., Disp: , Rfl:  .  pantoprazole (PROTONIX) 40 MG tablet, Take 1 tablet (40 mg total) by mouth daily., Disp: 30 tablet, Rfl: 11 .  ticagrelor (BRILINTA) 90 MG TABS tablet, Take 1 tablet (90 mg total) by mouth 2 (two) times daily., Disp: 180 tablet, Rfl: 3 .  traMADol (ULTRAM) 50 MG tablet, Take 1 tablet (50 mg total) by mouth every 6 (six) hours as needed for severe pain., Disp: 30 tablet, Rfl: 0 .  valACYclovir (VALTREX) 500 MG tablet, Take 500 mg by mouth daily. , Disp: , Rfl:   Past Medical History: Past Medical History:  Diagnosis Date  . Acute bronchitis   . Allergic rhinitis, cause unspecified   . Anemia   . Anxiety   . B12 deficiency   . BV (bacterial vaginosis) 06/22/1996  . Calculus of kidney   . Calculus of kidney   . Coronary atherosclerosis of  unspecified type of vessel, native or graft   . Dizziness   . Essential hypertension, benign   . Fibroid 2003  . Fibromyalgia   . H/O dysmenorrhea 2008  . H/O varicella   . Headache(784.0)    frequently  . HSV-2 infection 2009  . Hyperplastic colon polyp 05/16/2014  . Irritable bowel syndrome   . Meniere's disease, unspecified   . Menses, irregular 2003  . Myalgia and myositis, unspecified   . Obstructive sleep apnea (adult) (pediatric)   . Perimenopausal symptoms 2003  . Pure hypercholesterolemia   . Sarcoidosis   . Type II or unspecified type diabetes mellitus without mention of complication, not stated as uncontrolled   . Unspecified venous (peripheral) insufficiency   .  Vitamin D deficiency   . Vulvitis 2010  . Yeast infection     Tobacco Use: History  Smoking Status  . Never Smoker  Smokeless Tobacco  . Never Used    Comment: Daily Caffeine - 1  Exercise 2-3 times/weekly    Labs: Recent Review Flowsheet Data    Labs for ITP Cardiac and Pulmonary Rehab Latest Ref Rng & Units 08/14/2014 01/27/2015 06/01/2016 08/18/2016 06/15/2017   Cholestrol 0 - 200 mg/dL 167 181 296(H) 191 182   LDLCALC 0 - 99 mg/dL - 104(H) 202(H) 107 93   LDLDIRECT mg/dL 91.6 - - - -   HDL >40 mg/dL 46.90 44.40 53 41(L) 56   Trlycerides <150 mg/dL 215.0(H) 162.0(H) 205(H) 214(H) 163(H)   Hemoglobin A1c 4.6 - 6.5 % - - - - -   TCO2 0 - 100 mmol/L - - - - 22      Capillary Blood Glucose: Lab Results  Component Value Date   GLUCAP 101 (H) 08/31/2017   GLUCAP 132 (H) 08/29/2017   GLUCAP 101 (H) 08/24/2017   GLUCAP 84 08/24/2017   GLUCAP 104 (H) 08/24/2017     Exercise Target Goals:    Exercise Program Goal: Individual exercise prescription set with THRR, safety & activity barriers. Participant demonstrates ability to understand and report RPE using BORG scale, to self-measure pulse accurately, and to acknowledge the importance of the exercise prescription.  Exercise Prescription Goal: Starting with aerobic activity 30 plus minutes a day, 3 days per week for initial exercise prescription. Provide home exercise prescription and guidelines that participant acknowledges understanding prior to discharge.  Activity Barriers & Risk Stratification:     Activity Barriers & Cardiac Risk Stratification - 07/12/17 1452      Activity Barriers & Cardiac Risk Stratification   Activity Barriers Back Problems;Balance Concerns;Other (comment)   Comments B knee pain R>L   Cardiac Risk Stratification High      6 Minute Walk:     6 Minute Walk    Row Name 07/12/17 1429 07/12/17 1506 07/12/17 1513     6 Minute Walk   Phase Initial  -  -   Distance 1246 feet  -  -   Walk Time 6  minutes  -  -   # of Rest Breaks 0  -  -   MPH 2.4  -  -   METS 2.8  -  -   RPE 12  -  -   VO2 Peak 9.8  -  -   Symptoms  -  - Yes (comment)   Comments  -  - fatigue at end of walk test   Resting HR 89 bpm  -  -   Resting BP 110/78  -  -  Resting Oxygen Saturation  98 %  -  -   Exercise Oxygen Saturation  during 6 min walk 97 %  -  -   Max Ex. HR 101 bpm  -  -   Max Ex. BP 118/70  -  -   2 Minute Post BP  - 100/70  -      Oxygen Initial Assessment:   Oxygen Re-Evaluation:   Oxygen Discharge (Final Oxygen Re-Evaluation):   Initial Exercise Prescription:     Initial Exercise Prescription - 07/12/17 1500      Date of Initial Exercise RX and Referring Provider   Date 07/12/17   Referring Provider Thamas Jaegers MD     Treadmill   MPH 2.2   Grade 0   Minutes 10   METs 2.68     Recumbant Bike   Level 2   Watts 10   Minutes 10   METs 2.38     NuStep   Level 2   SPM 70   Minutes 10   METs 2     Track   Laps 8   Minutes 10   METs 2.39     Prescription Details   Frequency (times per week) 3   Duration Progress to 30 minutes of continuous aerobic without signs/symptoms of physical distress     Intensity   THRR 40-80% of Max Heartrate 63-126   Ratings of Perceived Exertion 11-13   Perceived Dyspnea 0-4     Progression   Progression Continue to progress workloads to maintain intensity without signs/symptoms of physical distress.     Resistance Training   Training Prescription Yes   Weight 2lbs   Reps 10-15      Perform Capillary Blood Glucose checks as needed.  Exercise Prescription Changes:     Exercise Prescription Changes    Row Name 07/20/17 1704 07/25/17 1700 08/08/17 1625 08/23/17 1600 09/05/17 1600     Response to Exercise   Blood Pressure (Admit) 122/76 116/70 130/72 112/70 128/80   Blood Pressure (Exercise) 140/82 140/70 140/70 160/80 138/74   Blood Pressure (Exit) 122/80 110/70 112/60 126/80 118/80   Heart Rate (Admit) 72 bpm  88 bpm 99 bpm 91 bpm 71 bpm   Heart Rate (Exercise) 91 bpm 110 bpm 113 bpm 98 bpm 98 bpm   Heart Rate (Exit) 72 bpm 82 bpm 88 bpm 72 bpm 61 bpm   Rating of Perceived Exertion (Exercise) 12 12 12 11 13    Symptoms none c/o lightheadedness after walking on treadmill none none none   Comments pt was oriented to exercise equipment discussed slowing down speed vs completely stopping on TM  -  -  -   Duration Continue with 30 min of aerobic exercise without signs/symptoms of physical distress. Continue with 30 min of aerobic exercise without signs/symptoms of physical distress. Continue with 30 min of aerobic exercise without signs/symptoms of physical distress. Continue with 30 min of aerobic exercise without signs/symptoms of physical distress. Continue with 30 min of aerobic exercise without signs/symptoms of physical distress.   Intensity THRR unchanged THRR unchanged THRR unchanged THRR unchanged THRR unchanged     Progression   Progression Continue to progress workloads to maintain intensity without signs/symptoms of physical distress. Continue to progress workloads to maintain intensity without signs/symptoms of physical distress. Continue to progress workloads to maintain intensity without signs/symptoms of physical distress. Continue to progress workloads to maintain intensity without signs/symptoms of physical distress. Continue to progress workloads to maintain intensity without signs/symptoms  of physical distress.   Average METs 1.6 2.3 2.5 2.5 2.9     Resistance Training   Training Prescription Yes Yes Yes Yes Yes   Weight 2lbs 2lbs 2lbs 2lbs 2lbs   Reps 10-15 10-15 10-15 10-15 10-15   Time 10 Minutes 10 Minutes 10 Minutes 10 Minutes 10 Minutes     Treadmill   MPH  - 2.2 2.2  - 2.3   Grade  - 0 2  - 2   Minutes  - 15 15  - 15   METs  - 2.68 3.29  - 3.39     Recumbant Bike   Level  -  -  - 2 2   Watts  -  -  - 16 16   Minutes  -  -  - 15 15   METs  -  -  - 2.6 2.6     NuStep    Level 2 2 3 4 4    SPM 60 70 70 80 80   Minutes 30 15 15 15 15    METs 1.6 1.8 1.9 2.4 2.4     Home Exercise Plan   Plans to continue exercise at  -  - Home (comment)  walking and going to [facility] (comment)  walk or go to [facility] (comment)  walk or go to [facility]   Frequency  -  - Add 2 additional days to program exercise sessions. Add 2 additional days to program exercise sessions. Add 2 additional days to program exercise sessions.   Initial Home Exercises Provided  -  - 08/05/17 08/05/17 08/05/17      Exercise Comments:     Exercise Comments    Row Name 07/26/17 1435 08/23/17 1624 09/14/17 1116       Exercise Comments Pt has completed 3 exercise sessions in cardiac, to soon to evaluate goals but pt is making great progress. Reviewed METs and goals. Pt is tolerating exercise well; will continue to monitor pt's progress and activity levels.  Reviewed METs and goals. Pt is tolerating exercise well; will continue to monitor pt's progress and activity levels.         Exercise Goals and Review:     Exercise Goals    Row Name 07/12/17 1507 07/12/17 1508           Exercise Goals   Increase Physical Activity Yes  -      Intervention Provide advice, education, support and counseling about physical activity/exercise needs.;Develop an individualized exercise prescription for aerobic and resistive training based on initial evaluation findings, risk stratification, comorbidities and participant's personal goals.  -      Expected Outcomes Achievement of increased cardiorespiratory fitness and enhanced flexibility, muscular endurance and strength shown through measurements of functional capacity and personal statement of participant.  -      Increase Strength and Stamina Yes  -      Intervention Provide advice, education, support and counseling about physical activity/exercise needs.;Develop an individualized exercise prescription for aerobic and resistive training based on  initial evaluation findings, risk stratification, comorbidities and participant's personal goals.  -      Expected Outcomes Achievement of increased cardiorespiratory fitness and enhanced flexibility, muscular endurance and strength shown through measurements of functional capacity and personal statement of participant.  -      Able to understand and use rate of perceived exertion (RPE) scale  - Yes      Intervention  - Provide education and explanation on  how to use RPE scale      Expected Outcomes  - Long Term:  Able to use RPE to guide intensity level when exercising independently;Short Term: Able to use RPE daily in rehab to express subjective intensity level      Knowledge and understanding of Target Heart Rate Range (THRR)  - Yes      Intervention  - Provide education and explanation of THRR including how the numbers were predicted and where they are located for reference      Expected Outcomes  - Short Term: Able to state/look up THRR;Short Term: Able to use daily as guideline for intensity in rehab;Long Term: Able to use THRR to govern intensity when exercising independently      Able to check pulse independently  - Yes      Understanding of Exercise Prescription  - Yes      Intervention  - Provide education, explanation, and written materials on patient's individual exercise prescription      Expected Outcomes  - Long Term: Able to explain home exercise prescription to exercise independently;Short Term: Able to explain program exercise prescription         Exercise Goals Re-Evaluation :     Exercise Goals Re-Evaluation    Row Name 07/26/17 1436 08/05/17 1742 08/23/17 1628 09/14/17 1116       Exercise Goal Re-Evaluation   Exercise Goals Review Increase Physical Activity;Able to understand and use rate of perceived exertion (RPE) scale;Knowledge and understanding of Target Heart Rate Range (THRR);Understanding of Exercise Prescription;Increase Strength and Stamina Increase Physical  Activity;Able to understand and use rate of perceived exertion (RPE) scale;Knowledge and understanding of Target Heart Rate Range (THRR);Understanding of Exercise Prescription;Increase Strength and Stamina;Able to check pulse independently Increase Physical Activity;Able to understand and use rate of perceived exertion (RPE) scale;Knowledge and understanding of Target Heart Rate Range (THRR);Understanding of Exercise Prescription;Increase Strength and Stamina;Able to check pulse independently Increase Physical Activity;Able to understand and use rate of perceived exertion (RPE) scale;Knowledge and understanding of Target Heart Rate Range (THRR);Understanding of Exercise Prescription;Increase Strength and Stamina;Able to check pulse independently    Comments Pt is exercising for 30 minutes and is doing well. Pt did report dizziness after treadmill. Pt abruptly stopped machine, express to pt to slow speed down and come to a stop slowly to avoid abrupt stop and dizziness. Reviewed home exercise with pt today.  Pt plans to walk or go to [facility] for exercise.  Pt plans to exercise 2x/week in addition to coming got cardiac rehab. Reviewed THR, pulse, RPE, sign and symptoms, NTG use, and when to call 911 or MD.  Also discussed weather considerations and indoor options.  Pt voiced understanding. Pt is up to 30 minutes of walking at home and notice that stamina and energy levels are improving. Pt did stated having some mild SOB with walking from time to time. Discussed slowing down pace, temperature precautions, and consider taking rest breaks if need be. Pt has noticed an improvement in walking tolerance and symptoms of SOB with activity. Pt has been walking indoors at grocery stores. Pt is also losing weight.    Expected Outcomes Pt will be able to use treadmill safely and feel more confident with using treadmill machine or change to walking track.  Pt will be compliant with HEP and improve in cardiorespiratory  fitness. Pt will be compliant with HEP and improve in cardiorespiratory fitness. Pt will be compliant with HEP, decrease in body fat percentage/weight and improve in  cardiorespiratory fitness.        Discharge Exercise Prescription (Final Exercise Prescription Changes):     Exercise Prescription Changes - 09/05/17 1600      Response to Exercise   Blood Pressure (Admit) 128/80   Blood Pressure (Exercise) 138/74   Blood Pressure (Exit) 118/80   Heart Rate (Admit) 71 bpm   Heart Rate (Exercise) 98 bpm   Heart Rate (Exit) 61 bpm   Rating of Perceived Exertion (Exercise) 13   Symptoms none   Duration Continue with 30 min of aerobic exercise without signs/symptoms of physical distress.   Intensity THRR unchanged     Progression   Progression Continue to progress workloads to maintain intensity without signs/symptoms of physical distress.   Average METs 2.9     Resistance Training   Training Prescription Yes   Weight 2lbs   Reps 10-15   Time 10 Minutes     Treadmill   MPH 2.3   Grade 2   Minutes 15   METs 3.39     Recumbant Bike   Level 2   Watts 16   Minutes 15   METs 2.6     NuStep   Level 4   SPM 80   Minutes 15   METs 2.4     Home Exercise Plan   Plans to continue exercise at Home (comment)  walk or go to [facility]   Frequency Add 2 additional days to program exercise sessions.   Initial Home Exercises Provided 08/05/17      Nutrition:  Target Goals: Understanding of nutrition guidelines, daily intake of sodium <1574m, cholesterol <203m calories 30% from fat and 7% or less from saturated fats, daily to have 5 or more servings of fruits and vegetables.  Biometrics:     Pre Biometrics - 07/12/17 1512      Pre Biometrics   % Body Fat 32.2 %   Flexibility 0 in       Nutrition Therapy Plan and Nutrition Goals:     Nutrition Therapy & Goals - 07/13/17 1118      Nutrition Therapy   Diet Carb Modified, Therapeutic Lifestyle Changes      Personal Nutrition Goals   Nutrition Goal Pt to identify and limit food sources of saturated fat, trans fat, and sodium   Personal Goal #2 Pt to identify food quantities necessary to achieve weight loss of 6-24 lb (2.7-10.9 kg) at graduation from cardiac rehab.      Intervention Plan   Intervention Prescribe, educate and counsel regarding individualized specific dietary modifications aiming towards targeted core components such as weight, hypertension, lipid management, diabetes, heart failure and other comorbidities.   Expected Outcomes Short Term Goal: Understand basic principles of dietary content, such as calories, fat, sodium, cholesterol and nutrients.;Long Term Goal: Adherence to prescribed nutrition plan.      Nutrition Discharge: Nutrition Scores:     Nutrition Assessments - 07/13/17 1118      MEDFICTS Scores   Pre Score 44      Nutrition Goals Re-Evaluation:   Nutrition Goals Re-Evaluation:   Nutrition Goals Discharge (Final Nutrition Goals Re-Evaluation):   Psychosocial: Target Goals: Acknowledge presence or absence of significant depression and/or stress, maximize coping skills, provide positive support system. Participant is able to verbalize types and ability to use techniques and skills needed for reducing stress and depression.  Initial Review & Psychosocial Screening:     Initial Psych Review & Screening - 07/12/17 1556      Initial  Review   Current issues with None Identified     Family Dynamics   Good Support System? Yes  family    Comments upon brief assessment, no psychosocial needs identified, no interventions necessary      Barriers   Psychosocial barriers to participate in program There are no identifiable barriers or psychosocial needs.     Screening Interventions   Interventions Encouraged to exercise      Quality of Life Scores:     Quality of Life - 07/12/17 1412      Quality of Life Scores   Health/Function Pre 19.85 %    Socioeconomic Pre 24.75 %   Psych/Spiritual Pre 23.93 %   Family Pre 28.5 %   GLOBAL Pre 22.93 %      PHQ-9: Recent Review Flowsheet Data    Depression screen [facility] 2/9 07/20/2017 12/03/2014   Decreased Interest 0 0   Down, Depressed, Hopeless 0 0   PHQ - 2 Score 0 0     Interpretation of Total Score  Total Score Depression Severity:  1-4 = Minimal depression, 5-9 = Mild depression, 10-14 = Moderate depression, 15-19 = Moderately severe depression, 20-27 = Severe depression   Psychosocial Evaluation and Intervention:     Psychosocial Evaluation - 07/27/17 1802      Psychosocial Evaluation & Interventions   Interventions Encouraged to exercise with the program and follow exercise prescription   Comments no psychhosocial needs identified, no interventions necessary    Expected Outcomes pt will exhibit positive outlook with good coping skills.    Continue Psychosocial Services  No Follow up required      Psychosocial Re-Evaluation:     Psychosocial Re-Evaluation    [place] Name 07/27/17 1803 08/23/17 3846 09/16/17 1155         Psychosocial Re-Evaluation   Current issues with None Identified None Identified None Identified     Comments no psychosocial needs identified, no interventions necessary  no psychosocial needs identified, no interventions necessary  no psychosocial needs identified, no interventions necessary      Expected Outcomes pt will exhibit positive outlook wtih good coping skills.  pt will exhibit positive outlook wtih good coping skills.  pt will exhibit positive outlook wtih good coping skills.      Interventions Encouraged to attend Cardiac Rehabilitation for the exercise;Stress management education;Relaxation education Encouraged to attend Cardiac Rehabilitation for the exercise;Stress management education;Relaxation education Encouraged to attend Cardiac Rehabilitation for the exercise;Stress management education;Relaxation education     Continue Psychosocial  Services  No Follow up required No Follow up required No Follow up required        Psychosocial Discharge (Final Psychosocial Re-Evaluation):     Psychosocial Re-Evaluation - 09/16/17 1155      Psychosocial Re-Evaluation   Current issues with None Identified   Comments no psychosocial needs identified, no interventions necessary    Expected Outcomes pt will exhibit positive outlook wtih good coping skills.    Interventions Encouraged to attend Cardiac Rehabilitation for the exercise;Stress management education;Relaxation education   Continue Psychosocial Services  No Follow up required      Vocational Rehabilitation: Provide vocational rehab assistance to qualifying candidates.   Vocational Rehab Evaluation & Intervention:   Education: Education Goals: Education classes will be provided on a weekly basis, covering required topics. Participant will state understanding/return demonstration of topics presented.  Learning Barriers/Preferences:     Learning Barriers/Preferences - 07/12/17 1452      Learning Barriers/Preferences   Learning Barriers Sight  Learning Preferences Pictoral;Video      Education Topics: Count Your Pulse:  -Group instruction provided by verbal instruction, demonstration, patient participation and written materials to support subject.  Instructors address importance of being able to find your pulse and how to count your pulse when at home without a heart monitor.  Patients get hands on experience counting their pulse with staff help and individually.   CARDIAC REHAB PHASE II EXERCISE from 09/14/2017 in [place]  Date  07/22/17  Instruction Review Code  2- meets goals/outcomes      Heart Attack, Angina, and Risk Factor Modification:  -Group instruction provided by verbal instruction, video, and written materials to support subject.  Instructors address signs and symptoms of angina and heart attacks.    Also discuss  risk factors for heart disease and how to make changes to improve heart health risk factors.   CARDIAC REHAB PHASE II EXERCISE from 09/14/2017 in [place]  Date  09/14/17  Instruction Review Code  2- meets goals/outcomes      Functional Fitness:  -Group instruction provided by verbal instruction, demonstration, patient participation, and written materials to support subject.  Instructors address safety measures for doing things around the house.  Discuss how to get up and down off the floor, how to pick things up properly, how to safely get out of a chair without assistance, and balance training.   CARDIAC REHAB PHASE II EXERCISE from 09/14/2017 in [place]  Date  09/02/17  Educator  [place] EP  Instruction Review Code  2- meets goals/outcomes      Meditation and Mindfulness:  -Group instruction provided by verbal instruction, patient participation, and written materials to support subject.  Instructor addresses importance of mindfulness and meditation practice to help reduce stress and improve awareness.  Instructor also leads participants through a meditation exercise.    CARDIAC REHAB PHASE II EXERCISE from 09/14/2017 in [place]  Date  08/17/17  Instruction Review Code  2- meets goals/outcomes      Stretching for Flexibility and Mobility:  -Group instruction provided by verbal instruction, patient participation, and written materials to support subject.  Instructors lead participants through series of stretches that are designed to increase flexibility thus improving mobility.  These stretches are additional exercise for major muscle groups that are typically performed during regular warm up and cool down.   CARDIAC REHAB PHASE II EXERCISE from 09/14/2017 in [place]  Date  09/07/17  Educator  Seward Carol  Instruction Review Code  2- meets  goals/outcomes      Hands Only CPR:  -Group verbal, video, and participation provides a basic overview of AHA guidelines for community CPR. Role-play of emergencies allow participants the opportunity to practice calling for help and chest compression technique with discussion of AED use.   Hypertension: -Group verbal and written instruction that provides a basic overview of hypertension including the most recent diagnostic guidelines, risk factor reduction with self-care instructions and medication management.    Nutrition I class: Heart Healthy Eating:  -Group instruction provided by PowerPoint slides, verbal discussion, and written materials to support subject matter. The instructor gives an explanation and review of the Therapeutic Lifestyle Changes diet recommendations, which includes a discussion on lipid goals, dietary fat, sodium, fiber, plant stanol/sterol esters, sugar, and the components of a well-balanced, healthy diet.   CARDIAC REHAB PHASE II EXERCISE from 09/14/2017  in [place]  Date  07/19/17  Educator  RD  Instruction Review Code  2- meets goals/outcomes      Nutrition II class: Lifestyle Skills:  -Group instruction provided by PowerPoint slides, verbal discussion, and written materials to support subject matter. The instructor gives an explanation and review of label reading, grocery shopping for heart health, heart healthy recipe modifications, and ways to make healthier choices when eating out.   Diabetes Question & Answer:  -Group instruction provided by PowerPoint slides, verbal discussion, and written materials to support subject matter. The instructor gives an explanation and review of diabetes co-morbidities, pre- and post-prandial blood glucose goals, pre-exercise blood glucose goals, signs, symptoms, and treatment of hypoglycemia and hyperglycemia, and foot care basics.   CARDIAC REHAB PHASE II EXERCISE from 09/14/2017 in [place]  Date  08/26/17  Educator  RD  Instruction Review Code  2- meets goals/outcomes      Diabetes Blitz:  -Group instruction provided by PowerPoint slides, verbal discussion, and written materials to support subject matter. The instructor gives an explanation and review of the physiology behind type 1 and type 2 diabetes, diabetes medications and rational behind using different medications, pre- and post-prandial blood glucose recommendations and Hemoglobin A1c goals, diabetes diet, and exercise including blood glucose guidelines for exercising safely.    Portion Distortion:  -Group instruction provided by PowerPoint slides, verbal discussion, written materials, and food models to support subject matter. The instructor gives an explanation of serving size versus portion size, changes in portions sizes over the last 20 years, and what consists of a serving from each food group.   CARDIAC REHAB PHASE II EXERCISE from 09/14/2017 in [place]  Date  08/31/17  Educator  RD  Instruction Review Code  2- meets goals/outcomes      Stress Management:  -Group instruction provided by verbal instruction, video, and written materials to support subject matter.  Instructors review role of stress in heart disease and how to cope with stress positively.     CARDIAC REHAB PHASE II EXERCISE from 09/14/2017 in [place]  Date  09/09/17  Instruction Review Code  2- meets goals/outcomes      Exercising on Your Own:  -Group instruction provided by verbal instruction, power point, and written materials to support subject.  Instructors discuss benefits of exercise, components of exercise, frequency and intensity of exercise, and end points for exercise.  Also discuss use of nitroglycerin and activating EMS.  Review options of places to exercise outside of rehab.  Review guidelines for sex with heart disease.    CARDIAC REHAB PHASE II EXERCISE from 09/14/2017 in [place]  Date  07/20/17  Educator  EP  Instruction Review Code  2- meets goals/outcomes      Cardiac Drugs I:  -Group instruction provided by verbal instruction and written materials to support subject.  Instructor reviews cardiac drug classes: antiplatelets, anticoagulants, beta blockers, and statins.  Instructor discusses reasons, side effects, and lifestyle considerations for each drug class.   CARDIAC REHAB PHASE II EXERCISE from 09/14/2017 in [place]  Date  07/27/17  Educator  pharmacy  Instruction Review Code  2- meets goals/outcomes      Cardiac Drugs II:  -Group instruction provided by verbal instruction and written materials to support subject.  Instructor reviews cardiac drug classes: angiotensin converting enzyme inhibitors (ACE-I), angiotensin  II receptor blockers (ARBs), nitrates, and calcium channel blockers.  Instructor discusses reasons, side effects, and lifestyle considerations for each drug class.   CARDIAC REHAB PHASE II EXERCISE from 09/14/2017 in [place]  Date  08/24/17  Instruction Review Code  2- meets goals/outcomes      Anatomy and Physiology of the Circulatory System:  Group verbal and written instruction and models provide basic cardiac anatomy and physiology, with the coronary electrical and arterial systems. Review of: AMI, Angina, Valve disease, Heart Failure, Peripheral Artery Disease, Cardiac Arrhythmia, Pacemakers, and the ICD.   Other Education:  -Group or individual verbal, written, or video instructions that support the educational goals of the cardiac rehab program.   Knowledge Questionnaire Score:     Knowledge Questionnaire Score - 07/12/17 1412      Knowledge Questionnaire Score   Pre Score 22/24      Core Components/Risk Factors/Patient Goals at Admission:     Personal Goals  and Risk Factors at Admission - 07/12/17 1433      Core Components/Risk Factors/Patient Goals on Admission    Weight Management Weight Loss;Yes   Intervention Obesity: Provide education and appropriate resources to help participant work on and attain dietary goals.;Weight Management/Obesity: Establish reasonable short term and long term weight goals.;Weight Management: Provide education and appropriate resources to help participant work on and attain dietary goals.;Weight Management: Develop a combined nutrition and exercise program designed to reach desired caloric intake, while maintaining appropriate intake of nutrient and fiber, sodium and fats, and appropriate energy expenditure required for the weight goal.   Admit Weight 181 lb (82.1 kg)   Expected Outcomes Weight Loss: Understanding of general recommendations for a balanced deficit meal plan, which promotes 1-2 lb weight loss per week and includes a negative energy balance of [phone] kcal/d;Long Term: Adherence to nutrition and physical activity/exercise program aimed toward attainment of established weight goal;Short Term: Continue to assess and modify interventions until short term weight is achieved   Diabetes Yes   Intervention Provide education about signs/symptoms and action to take for hypo/hyperglycemia.;Provide education about proper nutrition, including hydration, and aerobic/resistive exercise prescription along with prescribed medications to achieve blood glucose in normal ranges: Fasting glucose 65-99 mg/dL   Expected Outcomes Short Term: Participant verbalizes understanding of the signs/symptoms and immediate care of hyper/hypoglycemia, proper foot care and importance of medication, aerobic/resistive exercise and nutrition plan for blood glucose control.;Long Term: Attainment of HbA1C < 7%.   Hypertension Yes   Intervention Provide education on lifestyle modifcations including regular physical activity/exercise, weight management,  moderate sodium restriction and increased consumption of fresh fruit, vegetables, and low fat dairy, alcohol moderation, and smoking cessation.;Monitor prescription use compliance.   Expected Outcomes Short Term: Continued assessment and intervention until BP is < 140/56m HG in hypertensive participants. < 130/865mHG in hypertensive participants with diabetes, heart failure or chronic kidney disease.;Long Term: Maintenance of blood pressure at goal levels.   Lipids Yes   Intervention Provide education and support for participant on nutrition & aerobic/resistive exercise along with prescribed medications to achieve LDL <7023mHDL >86m90m Expected Outcomes Short Term: Participant states understanding of desired cholesterol values and is compliant with medications prescribed. Participant is following exercise prescription and nutrition guidelines.;Long Term: Cholesterol controlled with medications as prescribed, with individualized exercise RX and with personalized nutrition plan. Value goals: LDL < 70mg79mL > 40 mg.      Core Components/Risk Factors/Patient Goals Review:      Goals  and Risk Factor Review    Row Name 07/27/17 1759 08/23/17 9806 09/16/17 1154         Core Components/Risk Factors/Patient Goals Review   Personal Goals Review Weight Management/Obesity;Hypertension;Diabetes;Lipids Weight Management/Obesity;Hypertension;Diabetes;Lipids Weight Management/Obesity;Hypertension;Diabetes;Lipids     Review pt with multiple RF demonstrates eagerness to participate in CR exercise,nutrition and education . pt with multiple RF demonstrates eagerness to participate in CR exercise,nutrition and education . pt c/o continued DOE with more than usual activity, however has noticed it is easier to perform household chores.  pt with multiple RF demonstrates eagerness to participate in CR exercise,nutrition and education . pt c/o continued DOE with more than usual activity, however continues to see  improvements with  performing  household chores.      Expected Outcomes pt will participate in CR exercise, nutrition and lifestyle modification to decrease overall RF.   pt will participate in CR exercise, nutrition and lifestyle modification to decrease overall RF.   pt will participate in CR exercise, nutrition and lifestyle modification to decrease overall RF.          Core Components/Risk Factors/Patient Goals at Discharge (Final Review):      Goals and Risk Factor Review - 09/16/17 1154      Core Components/Risk Factors/Patient Goals Review   Personal Goals Review Weight Management/Obesity;Hypertension;Diabetes;Lipids   Review pt with multiple RF demonstrates eagerness to participate in CR exercise,nutrition and education . pt c/o continued DOE with more than usual activity, however continues to see improvements with  performing  household chores.    Expected Outcomes pt will participate in CR exercise, nutrition and lifestyle modification to decrease overall RF.        ITP Comments:     ITP Comments    Row Name 07/12/17 1402 07/27/17 1756 08/23/17 0711 08/23/17 1434 09/16/17 1154   ITP Comments Medical Director- Dr. Fransico Him, MD. Medical Director- Dr. Fransico Him, MD. 30 day ITP review.  pt with good participation and attendance. - 30 day ITP review.  pt with good participation and attendance.      Comments:

## 2017-09-19 ENCOUNTER — Encounter (HOSPITAL_COMMUNITY)
Admission: RE | Admit: 2017-09-19 | Discharge: 2017-09-19 | Disposition: A | Discharge status: Home / Self Care | Payer: BC Managed Care – PPO | Source: Ambulatory Visit | Attending: Interventional Cardiology | Admitting: Interventional Cardiology

## 2017-09-19 DIAGNOSIS — Z955 Presence of coronary angioplasty implant and graft: Secondary | ICD-10-CM | POA: Diagnosis not present

## 2017-09-19 DIAGNOSIS — I213 ST elevation (STEMI) myocardial infarction of unspecified site: Secondary | ICD-10-CM

## 2017-09-21 ENCOUNTER — Encounter (HOSPITAL_COMMUNITY)
Admission: RE | Admit: 2017-09-21 | Discharge: 2017-09-21 | Disposition: A | Discharge status: Home / Self Care | Payer: BC Managed Care – PPO | Source: Ambulatory Visit | Attending: Interventional Cardiology | Admitting: Interventional Cardiology

## 2017-09-21 DIAGNOSIS — Z955 Presence of coronary angioplasty implant and graft: Secondary | ICD-10-CM

## 2017-09-21 DIAGNOSIS — I213 ST elevation (STEMI) myocardial infarction of unspecified site: Secondary | ICD-10-CM

## 2017-09-23 ENCOUNTER — Encounter (HOSPITAL_COMMUNITY)
Admission: RE | Admit: 2017-09-23 | Discharge: 2017-09-23 | Disposition: A | Discharge status: Home / Self Care | Payer: BC Managed Care – PPO | Source: Ambulatory Visit | Attending: Interventional Cardiology | Admitting: Interventional Cardiology

## 2017-09-23 DIAGNOSIS — Z955 Presence of coronary angioplasty implant and graft: Secondary | ICD-10-CM

## 2017-09-23 DIAGNOSIS — I213 ST elevation (STEMI) myocardial infarction of unspecified site: Secondary | ICD-10-CM

## 2017-09-26 ENCOUNTER — Encounter (HOSPITAL_COMMUNITY)
Admission: RE | Admit: 2017-09-26 | Discharge: 2017-09-26 | Disposition: A | Discharge status: Home / Self Care | Payer: BC Managed Care – PPO | Source: Ambulatory Visit | Attending: Interventional Cardiology | Admitting: Interventional Cardiology

## 2017-09-26 DIAGNOSIS — Z955 Presence of coronary angioplasty implant and graft: Secondary | ICD-10-CM

## 2017-09-26 DIAGNOSIS — I213 ST elevation (STEMI) myocardial infarction of unspecified site: Secondary | ICD-10-CM

## 2017-09-28 ENCOUNTER — Encounter (HOSPITAL_COMMUNITY)
Admission: RE | Admit: 2017-09-28 | Discharge: 2017-09-28 | Disposition: A | Discharge status: Home / Self Care | Payer: BC Managed Care – PPO | Source: Ambulatory Visit | Attending: Interventional Cardiology | Admitting: Interventional Cardiology

## 2017-09-28 DIAGNOSIS — I213 ST elevation (STEMI) myocardial infarction of unspecified site: Secondary | ICD-10-CM

## 2017-09-28 DIAGNOSIS — Z955 Presence of coronary angioplasty implant and graft: Secondary | ICD-10-CM

## 2017-09-30 ENCOUNTER — Encounter (HOSPITAL_COMMUNITY)
Admission: RE | Admit: 2017-09-30 | Discharge: 2017-09-30 | Disposition: A | Discharge status: Home / Self Care | Payer: BC Managed Care – PPO | Source: Ambulatory Visit | Attending: Interventional Cardiology | Admitting: Interventional Cardiology

## 2017-09-30 DIAGNOSIS — Z955 Presence of coronary angioplasty implant and graft: Secondary | ICD-10-CM

## 2017-09-30 DIAGNOSIS — I213 ST elevation (STEMI) myocardial infarction of unspecified site: Secondary | ICD-10-CM

## 2017-09-30 LAB — GLUCOSE, CAPILLARY: GLUCOSE-CAPILLARY: 169 mg/dL — AB (ref 65–99)

## 2017-10-03 ENCOUNTER — Encounter (HOSPITAL_COMMUNITY)
Admission: RE | Admit: 2017-10-03 | Discharge: 2017-10-03 | Disposition: A | Discharge status: Home / Self Care | Payer: BC Managed Care – PPO | Source: Ambulatory Visit | Attending: Interventional Cardiology | Admitting: Interventional Cardiology

## 2017-10-03 DIAGNOSIS — Z955 Presence of coronary angioplasty implant and graft: Secondary | ICD-10-CM

## 2017-10-03 DIAGNOSIS — I213 ST elevation (STEMI) myocardial infarction of unspecified site: Secondary | ICD-10-CM

## 2017-10-05 ENCOUNTER — Encounter (HOSPITAL_COMMUNITY)
Admission: RE | Admit: 2017-10-05 | Discharge: 2017-10-05 | Disposition: A | Discharge status: Home / Self Care | Payer: BC Managed Care – PPO | Source: Ambulatory Visit | Attending: Interventional Cardiology | Admitting: Interventional Cardiology

## 2017-10-05 DIAGNOSIS — Z955 Presence of coronary angioplasty implant and graft: Secondary | ICD-10-CM | POA: Diagnosis not present

## 2017-10-05 DIAGNOSIS — I213 ST elevation (STEMI) myocardial infarction of unspecified site: Secondary | ICD-10-CM

## 2017-10-07 ENCOUNTER — Encounter (HOSPITAL_COMMUNITY): Payer: BC Managed Care – PPO

## 2017-10-10 ENCOUNTER — Encounter (HOSPITAL_COMMUNITY)
Admission: RE | Admit: 2017-10-10 | Discharge: 2017-10-10 | Disposition: A | Discharge status: Home / Self Care | Payer: BC Managed Care – PPO | Source: Ambulatory Visit | Attending: Interventional Cardiology | Admitting: Interventional Cardiology

## 2017-10-10 VITALS — Ht 62.5 in | Wt 173.7 lb

## 2017-10-10 DIAGNOSIS — Z955 Presence of coronary angioplasty implant and graft: Secondary | ICD-10-CM | POA: Diagnosis not present

## 2017-10-10 DIAGNOSIS — I213 ST elevation (STEMI) myocardial infarction of unspecified site: Secondary | ICD-10-CM

## 2017-10-12 ENCOUNTER — Encounter (HOSPITAL_COMMUNITY)
Admission: RE | Admit: 2017-10-12 | Discharge: 2017-10-12 | Disposition: A | Discharge status: Home / Self Care | Payer: BC Managed Care – PPO | Source: Ambulatory Visit | Attending: Interventional Cardiology | Admitting: Interventional Cardiology

## 2017-10-12 DIAGNOSIS — Z955 Presence of coronary angioplasty implant and graft: Secondary | ICD-10-CM | POA: Diagnosis not present

## 2017-10-12 DIAGNOSIS — I213 ST elevation (STEMI) myocardial infarction of unspecified site: Secondary | ICD-10-CM

## 2017-10-12 NOTE — Addendum Note (Signed)
Encounter addended by: Dorna Bloom D on: 10/12/2017 11:22 AM  Actions taken: Visit Navigator Flowsheet section accepted

## 2017-10-14 ENCOUNTER — Ambulatory Visit (HOSPITAL_COMMUNITY): Payer: Self-pay | Admitting: Cardiac Rehabilitation

## 2017-10-14 ENCOUNTER — Encounter (HOSPITAL_COMMUNITY): Payer: BC Managed Care – PPO

## 2017-10-14 NOTE — Progress Notes (Signed)
Cardiac Individual Treatment Plan  Patient Details  Name: Jocelyn [name] MRN: [id number] Date of Birth: 10-02-1956 Referring Provider:     CARDIAC REHAB PHASE II ORIENTATION from 07/12/2017 in [place]  Referring Provider  Thamas Jaegers MD      Initial Encounter Date:    CARDIAC REHAB PHASE II ORIENTATION from 07/12/2017 in [place]  Date  07/12/17  Referring Provider  Thamas Jaegers MD      Visit Diagnosis: No diagnosis found.  Patient's Home Medications on Admission:  Current Outpatient Medications:  .  albuterol (PROVENTIL HFA;VENTOLIN HFA) 108 (90 BASE) MCG/ACT inhaler, Inhale 1-2 puffs into the lungs every 6 (six) hours as needed for wheezing or shortness of breath., Disp: 8 g, Rfl: 2 .  ALPRAZolam (XANAX) 0.5 MG tablet, Take 1 tablet by mouth daily at bedtime, Disp: 30 tablet, Rfl: 5 .  aspirin 81 MG chewable tablet, Chew 1 tablet (81 mg total) by mouth daily., Disp: , Rfl:  .  CARTIA XT 240 MG 24 hr capsule, take 1 capsule by mouth once daily, Disp: 90 capsule, Rfl: 3 .  Cyanocobalamin 1000 MCG/ML KIT, Inject 1,000 mg as directed every 30 (thirty) days., Disp: , Rfl:  .  Empagliflozin-Metformin HCl (SYNJARDY) 03-999 MG TABS, Take 1 tablet by mouth daily., Disp: , Rfl:  .  Evolocumab (REPATHA SURECLICK) 939 MG/ML SOAJ, Inject 1 pen into the skin every 14 (fourteen) days., Disp: 2 pen, Rfl: 11 .  fluticasone (FLONASE) 50 MCG/ACT nasal spray, Place 2 sprays into both nostrils at bedtime., Disp: 16 g, Rfl: 2 .  furosemide (LASIX) 20 MG tablet, Take 20 mg by mouth daily as needed for fluid or edema., Disp: , Rfl:  .  HP ACTHAR 80 UNIT/ML injectable gel, Inject 80 Units into the skin 3 (three) times a week. , Disp: , Rfl:  .  losartan (COZAAR) 100 MG tablet, take 1 tablet by mouth once daily, Disp: 30 tablet, Rfl: 10 .  metoprolol tartrate (LOPRESSOR) 50 MG tablet, Take 1 tablet (50 mg total) by mouth 2 (two)  times daily., Disp: 60 tablet, Rfl: 5 .  mometasone-formoterol (DULERA) 100-5 MCG/ACT AERO, Inhale 2 puffs into the lungs 2 (two) times daily., Disp: , Rfl:  .  nitroGLYCERIN (NITROSTAT) 0.4 MG SL tablet, Place 1 tablet (0.4 mg total) under the tongue every 5 (five) minutes as needed for chest pain., Disp: 25 tablet, Rfl: 2 .  ondansetron (ZOFRAN-ODT) 4 MG disintegrating tablet, Take 4 mg by mouth every 8 (eight) hours as needed for nausea or vomiting., Disp: , Rfl:  .  pantoprazole (PROTONIX) 40 MG tablet, Take 1 tablet (40 mg total) by mouth daily., Disp: 30 tablet, Rfl: 11 .  ticagrelor (BRILINTA) 90 MG TABS tablet, Take 1 tablet (90 mg total) by mouth 2 (two) times daily., Disp: 180 tablet, Rfl: 3 .  traMADol (ULTRAM) 50 MG tablet, Take 1 tablet (50 mg total) by mouth every 6 (six) hours as needed for severe pain., Disp: 30 tablet, Rfl: 0 .  valACYclovir (VALTREX) 500 MG tablet, Take 500 mg by mouth daily. , Disp: , Rfl:   Past Medical History: Past Medical History:  Diagnosis Date  . Acute bronchitis   . Allergic rhinitis, cause unspecified   . Anemia   . Anxiety   . B12 deficiency   . BV (bacterial vaginosis) 06/22/1996  . Calculus of kidney   . Calculus of kidney   . Coronary atherosclerosis of  unspecified type of vessel, native or graft   . Dizziness   . Essential hypertension, benign   . Fibroid 2003  . Fibromyalgia   . H/O dysmenorrhea 2008  . H/O varicella   . Headache(784.0)    frequently  . HSV-2 infection 2009  . Hyperplastic colon polyp 05/16/2014  . Irritable bowel syndrome   . Meniere's disease, unspecified   . Menses, irregular 2003  . Myalgia and myositis, unspecified   . Obstructive sleep apnea (adult) (pediatric)   . Perimenopausal symptoms 2003  . Pure hypercholesterolemia   . Sarcoidosis   . Type II or unspecified type diabetes mellitus without mention of complication, not stated as uncontrolled   . Unspecified venous (peripheral) insufficiency   . Vitamin  D deficiency   . Vulvitis 2010  . Yeast infection     Tobacco Use: Social History   Tobacco Use  Smoking Status Never Smoker  Smokeless Tobacco Never Used  Tobacco Comment   Daily Caffeine - 1  Exercise 2-3 times/weekly    Labs: Recent Review Flowsheet Data    Labs for ITP Cardiac and Pulmonary Rehab Latest Ref Rng & Units 08/14/2014 01/27/2015 06/01/2016 08/18/2016 06/15/2017   Cholestrol 0 - 200 mg/dL 167 181 296(H) 191 182   LDLCALC 0 - 99 mg/dL - 104(H) 202(H) 107 93   LDLDIRECT mg/dL 91.6 - - - -   HDL >40 mg/dL 46.90 44.40 53 41(L) 56   Trlycerides <150 mg/dL 215.0(H) 162.0(H) 205(H) 214(H) 163(H)   Hemoglobin A1c 4.6 - 6.5 % - - - - -   TCO2 0 - 100 mmol/L - - - - 22      Capillary Blood Glucose: Lab Results  Component Value Date   GLUCAP 169 (H) 09/30/2017   GLUCAP 101 (H) 08/31/2017   GLUCAP 132 (H) 08/29/2017   GLUCAP 101 (H) 08/24/2017   GLUCAP 84 08/24/2017     Exercise Target Goals:    Exercise Program Goal: Individual exercise prescription set with THRR, safety & activity barriers. Participant demonstrates ability to understand and report RPE using BORG scale, to self-measure pulse accurately, and to acknowledge the importance of the exercise prescription.  Exercise Prescription Goal: Starting with aerobic activity 30 plus minutes a day, 3 days per week for initial exercise prescription. Provide home exercise prescription and guidelines that participant acknowledges understanding prior to discharge.  Activity Barriers & Risk Stratification:   6 Minute Walk: 6 Minute Walk    Row Name 10/10/17 1631         6 Minute Walk   Phase  Discharge     Distance  1362 feet     Distance % Change  9.3 %     Distance Feet Change  116 ft     Walk Time  6 minutes     # of Rest Breaks  0     MPH  2.58     METS  3.1     RPE  13     Perceived Dyspnea   2     VO2 Peak  10.9     Symptoms  Yes (comment)     Comments  Pt reported mild SOB and fatigue      Resting  HR  86 bpm     Resting BP  120/80     Max Ex. HR  100 bpm     Max Ex. BP  122/80     2 Minute Post BP  108/60  Oxygen Initial Assessment:   Oxygen Re-Evaluation:   Oxygen Discharge (Final Oxygen Re-Evaluation):   Initial Exercise Prescription:   Perform Capillary Blood Glucose checks as needed.  Exercise Prescription Changes: Exercise Prescription Changes    Row Name 08/08/17 1625 08/23/17 1600 09/05/17 1600 09/28/17 1551       Response to Exercise   Blood Pressure (Admit)  130/72  112/70  128/80  108/62    Blood Pressure (Exercise)  140/70  160/80  138/74  134/70    Blood Pressure (Exit)  112/60  126/80  118/80  108/70    Heart Rate (Admit)  99 bpm  91 bpm  71 bpm  95 bpm    Heart Rate (Exercise)  113 bpm  98 bpm  98 bpm  110 bpm    Heart Rate (Exit)  88 bpm  72 bpm  61 bpm  76 bpm    Rating of Perceived Exertion (Exercise)  12  11  13  12     Symptoms  none  none  none  none    Duration  Continue with 30 min of aerobic exercise without signs/symptoms of physical distress.  Continue with 30 min of aerobic exercise without signs/symptoms of physical distress.  Continue with 30 min of aerobic exercise without signs/symptoms of physical distress.  Continue with 30 min of aerobic exercise without signs/symptoms of physical distress.    Intensity  THRR unchanged  THRR unchanged  THRR unchanged  THRR unchanged      Progression   Progression  Continue to progress workloads to maintain intensity without signs/symptoms of physical distress.  Continue to progress workloads to maintain intensity without signs/symptoms of physical distress.  Continue to progress workloads to maintain intensity without signs/symptoms of physical distress.  Continue to progress workloads to maintain intensity without signs/symptoms of physical distress.    Average METs  2.5  2.5  2.9  2      Resistance Training   Training Prescription  Yes  Yes  Yes  No Relaxation Day     Weight  2lbs  2lbs  2lbs   -    Reps  10-15  10-15  10-15  -    Time  10 Minutes  10 Minutes  10 Minutes  -      Treadmill   MPH  2.2  -  2.3  2.3    Grade  2  -  2  2    Minutes  15  -  15  15    METs  3.29  -  3.39  3.39      Recumbant Bike   Level  -  2  2  2     Watts  -  16  16  16     Minutes  -  15  15  15     METs  -  2.6  2.6  1.8      NuStep   Level  3  4  4  4     SPM  70  80  80  85    Minutes  15  15  15  15     METs  1.9  2.4  2.4  2.6      Home Exercise Plan   Plans to continue exercise at  Home (comment) walking and going to [facility] (comment) walk or go to [facility] (comment) walk or go to [facility] (comment) walk or go to [facility]  Frequency  Add 2 additional days to program exercise sessions.  Add 2 additional days to program exercise sessions.  Add 2 additional days to program exercise sessions.  Add 2 additional days to program exercise sessions.    Initial Home Exercises Provided  08/05/17  08/05/17  08/05/17  08/05/17       Exercise Comments: Exercise Comments    Row Name 08/23/17 1624 09/14/17 1116 10/11/17 1651       Exercise Comments  Reviewed METs and goals. Pt is tolerating exercise well; will continue to monitor pt's progress and activity levels.   Reviewed METs and goals. Pt is tolerating exercise well; will continue to monitor pt's progress and activity levels.   Reviewed METs and goals. Pt is tolerating exercise well; will continue to monitor pt's progress and activity levels.         Exercise Goals and Review:   Exercise Goals Re-Evaluation : Exercise Goals Re-Evaluation    Row Name 08/23/17 1628 09/14/17 1116 10/11/17 1648 10/11/17 1649       Exercise Goal Re-Evaluation   Exercise Goals Review  Increase Physical Activity;Able to understand and use rate of perceived exertion (RPE) scale;Knowledge and understanding of Target Heart Rate Range (THRR);Understanding of Exercise Prescription;Increase Strength and Stamina;Able to check pulse  independently  Increase Physical Activity;Able to understand and use rate of perceived exertion (RPE) scale;Knowledge and understanding of Target Heart Rate Range (THRR);Understanding of Exercise Prescription;Increase Strength and Stamina;Able to check pulse independently  Increase Physical Activity;Able to understand and use rate of perceived exertion (RPE) scale;Knowledge and understanding of Target Heart Rate Range (THRR);Understanding of Exercise Prescription;Increase Strength and Stamina;Able to check pulse independently  -    Comments  Pt is up to 30 minutes of walking at home and notice that stamina and energy levels are improving. Pt did stated having some mild SOB with walking from time to time. Discussed slowing down pace, temperature precautions, and consider taking rest breaks if need be.  Pt has noticed an improvement in walking tolerance and symptoms of SOB with activity. Pt has been walking indoors at grocery stores. Pt is also losing weight.  Pt has increased walking tolerance since starting the program. Pt exit walk test  Pt has increased walking tolerance since starting the program. Pt exit walk test 116 ft further than entrance walk test. Pt is also down in waist/hip circumference and body fat percentage.     Expected Outcomes  Pt will be compliant with HEP and improve in cardiorespiratory fitness.  Pt will be compliant with HEP, decrease in body fat percentage/weight and improve in cardiorespiratory fitness.  -  Pt will be compliant with HEP, decrease in body fat percentage/weight and improve in cardiorespiratory fitness.        Discharge Exercise Prescription (Final Exercise Prescription Changes): Exercise Prescription Changes - 09/28/17 1551      Response to Exercise   Blood Pressure (Admit)  108/62    Blood Pressure (Exercise)  134/70    Blood Pressure (Exit)  108/70    Heart Rate (Admit)  95 bpm    Heart Rate (Exercise)  110 bpm    Heart Rate (Exit)  76 bpm    Rating of  Perceived Exertion (Exercise)  12    Symptoms  none    Duration  Continue with 30 min of aerobic exercise without signs/symptoms of physical distress.    Intensity  THRR unchanged      Progression   Progression  Continue to progress workloads to  maintain intensity without signs/symptoms of physical distress.    Average METs  2      Resistance Training   Training Prescription  No Relaxation Day       Treadmill   MPH  2.3    Grade  2    Minutes  15    METs  3.39      Recumbant Bike   Level  2    Watts  16    Minutes  15    METs  1.8      NuStep   Level  4    SPM  85    Minutes  15    METs  2.6      Home Exercise Plan   Plans to continue exercise at  Home (comment) walk or go to [facility]    Frequency  Add 2 additional days to program exercise sessions.    Initial Home Exercises Provided  08/05/17       Nutrition:  Target Goals: Understanding of nutrition guidelines, daily intake of sodium <158m, cholesterol <2069m calories 30% from fat and 7% or less from saturated fats, daily to have 5 or more servings of fruits and vegetables.  Biometrics:  Post Biometrics - 10/12/17 1123       Post  Biometrics   % Body Fat  41.6 %       Nutrition Therapy Plan and Nutrition Goals:   Nutrition Discharge: Nutrition Scores:   Nutrition Goals Re-Evaluation:   Nutrition Goals Re-Evaluation:   Nutrition Goals Discharge (Final Nutrition Goals Re-Evaluation):   Psychosocial: Target Goals: Acknowledge presence or absence of significant depression and/or stress, maximize coping skills, provide positive support system. Participant is able to verbalize types and ability to use techniques and skills needed for reducing stress and depression.  Initial Review & Psychosocial Screening:   Quality of Life Scores:   PHQ-9: Recent Review Flowsheet Data    [facility] 07/20[facility]2017 12/03/2014   Decreased Interest 0 0   Down, Depressed, Hopeless 0 0   PHQ - 2 Score  0 0     Interpretation of Total Score  Total Score Depression Severity:  1-4 = Minimal depression, 5-9 = Mild depression, 10-14 = Moderate depression, 15-19 = Moderately severe depression, 20-27 = Severe depression   Psychosocial Evaluation and Intervention:   Psychosocial Re-Evaluation: Psychosocial Re-Evaluation    Row Name 08/23/17 [id number] 1155 10/11/17 1654         Psychosocial Re-Evaluation   Current issues with  None Identified  None Identified  None Identified     Comments  no psychosocial needs identified, no interventions necessary   no psychosocial needs identified, no interventions necessary   no psychosocial needs identified, no interventions necessary      Expected Outcomes  pt will exhibit positive outlook wtih good coping skills.   pt will exhibit positive outlook wtih good coping skills.   pt will exhibit positive outlook wtih good coping skills.      Interventions  Encouraged to attend Cardiac Rehabilitation for the exercise;Stress management education;Relaxation education  Encouraged to attend Cardiac Rehabilitation for the exercise;Stress management education;Relaxation education  Encouraged to attend Cardiac Rehabilitation for the exercise;Stress management education;Relaxation education     Continue Psychosocial Services   No Follow up required  No Follow up required  No Follow up required        Psychosocial Discharge (Final Psychosocial Re-Evaluation): Psychosocial Re-Evaluation - 10/11/17 1654      Psychosocial Re-Evaluation  Current issues with  None Identified    Comments  no psychosocial needs identified, no interventions necessary     Expected Outcomes  pt will exhibit positive outlook wtih good coping skills.     Interventions  Encouraged to attend Cardiac Rehabilitation for the exercise;Stress management education;Relaxation education    Continue Psychosocial Services   No Follow up required       Vocational Rehabilitation: Provide vocational  rehab assistance to qualifying candidates.   Vocational Rehab Evaluation & Intervention:   Education: Education Goals: Education classes will be provided on a weekly basis, covering required topics. Participant will state understanding/return demonstration of topics presented.  Learning Barriers/Preferences:   Education Topics: Count Your Pulse:  -Group instruction provided by verbal instruction, demonstration, patient participation and written materials to support subject.  Instructors address importance of being able to find your pulse and how to count your pulse when at home without a heart monitor.  Patients get hands on experience counting their pulse with staff help and individually.   CARDIAC REHAB PHASE II EXERCISE from 10/12/2017 in [place]  Date  07/22/17  Instruction Review Code  2- meets goals/outcomes      Heart Attack, Angina, and Risk Factor Modification:  -Group instruction provided by verbal instruction, video, and written materials to support subject.  Instructors address signs and symptoms of angina and heart attacks.    Also discuss risk factors for heart disease and how to make changes to improve heart health risk factors.   CARDIAC REHAB PHASE II EXERCISE from 10/12/2017 in [place]  Date  09/14/17  Instruction Review Code  2- meets goals/outcomes      Functional Fitness:  -Group instruction provided by verbal instruction, demonstration, patient participation, and written materials to support subject.  Instructors address safety measures for doing things around the house.  Discuss how to get up and down off the floor, how to pick things up properly, how to safely get out of a chair without assistance, and balance training.   CARDIAC REHAB PHASE II EXERCISE from 10/12/2017 in [place]  Date  09/02/17  Educator  [place] EP  Instruction Review Code  2- meets  goals/outcomes      Meditation and Mindfulness:  -Group instruction provided by verbal instruction, patient participation, and written materials to support subject.  Instructor addresses importance of mindfulness and meditation practice to help reduce stress and improve awareness.  Instructor also leads participants through a meditation exercise.    CARDIAC REHAB PHASE II EXERCISE from 10/12/2017 in [place]  Date  08/17/17  Instruction Review Code  2- meets goals/outcomes      Stretching for Flexibility and Mobility:  -Group instruction provided by verbal instruction, patient participation, and written materials to support subject.  Instructors lead participants through series of stretches that are designed to increase flexibility thus improving mobility.  These stretches are additional exercise for major muscle groups that are typically performed during regular warm up and cool down.   CARDIAC REHAB PHASE II EXERCISE from 10/12/2017 in [place]  Date  09/07/17  Educator  [place] Carol  Instruction Review Code  2- meets goals/outcomes      Hands Only CPR:  -Group verbal, video, and participation provides a basic overview of AHA guidelines for community CPR. Role-play of emergencies allow participants the opportunity to practice calling for help and chest compression  technique with discussion of AED use.   Hypertension: -Group verbal and written instruction that provides a basic overview of hypertension including the most recent diagnostic guidelines, risk factor reduction with self-care instructions and medication management.    Nutrition I class: Heart Healthy Eating:  -Group instruction provided by PowerPoint slides, verbal discussion, and written materials to support subject matter. The instructor gives an explanation and review of the Therapeutic Lifestyle Changes diet recommendations, which includes a  discussion on lipid goals, dietary fat, sodium, fiber, plant stanol/sterol esters, sugar, and the components of a well-balanced, healthy diet.   CARDIAC REHAB PHASE II EXERCISE from 10/12/2017 in [place]  Date  07/19/17  Educator  RD  Instruction Review Code  2- meets goals/outcomes      Nutrition II class: Lifestyle Skills:  -Group instruction provided by PowerPoint slides, verbal discussion, and written materials to support subject matter. The instructor gives an explanation and review of label reading, grocery shopping for heart health, heart healthy recipe modifications, and ways to make healthier choices when eating out.   Diabetes Question & Answer:  -Group instruction provided by PowerPoint slides, verbal discussion, and written materials to support subject matter. The instructor gives an explanation and review of diabetes co-morbidities, pre- and post-prandial blood glucose goals, pre-exercise blood glucose goals, signs, symptoms, and treatment of hypoglycemia and hyperglycemia, and foot care basics.   CARDIAC REHAB PHASE II EXERCISE from 10/12/2017 in [place]  Date  08/26/17  Educator  RD  Instruction Review Code  2- meets goals/outcomes      Diabetes Blitz:  -Group instruction provided by PowerPoint slides, verbal discussion, and written materials to support subject matter. The instructor gives an explanation and review of the physiology behind type 1 and type 2 diabetes, diabetes medications and rational behind using different medications, pre- and post-prandial blood glucose recommendations and Hemoglobin A1c goals, diabetes diet, and exercise including blood glucose guidelines for exercising safely.    Portion Distortion:  -Group instruction provided by PowerPoint slides, verbal discussion, written materials, and food models to support subject matter. The instructor gives an explanation of serving size versus  portion size, changes in portions sizes over the last 20 years, and what consists of a serving from each food group.   CARDIAC REHAB PHASE II EXERCISE from 10/12/2017 in [place]  Date  08/31/17  Educator  RD  Instruction Review Code  2- meets goals/outcomes      Stress Management:  -Group instruction provided by verbal instruction, video, and written materials to support subject matter.  Instructors review role of stress in heart disease and how to cope with stress positively.     CARDIAC REHAB PHASE II EXERCISE from 10/12/2017 in [place]  Date  09/09/17  Instruction Review Code  2- meets goals/outcomes      Exercising on Your Own:  -Group instruction provided by verbal instruction, power point, and written materials to support subject.  Instructors discuss benefits of exercise, components of exercise, frequency and intensity of exercise, and end points for exercise.  Also discuss use of nitroglycerin and activating EMS.  Review options of places to exercise outside of rehab.  Review guidelines for sex with heart disease.   CARDIAC REHAB PHASE II EXERCISE from 10/12/2017 in [place]  Date  09/21/17  Educator  EP  Instruction Review Code  R- Review/reinforce  Cardiac Drugs I:  -Group instruction provided by verbal instruction and written materials to support subject.  Instructor reviews cardiac drug classes: antiplatelets, anticoagulants, beta blockers, and statins.  Instructor discusses reasons, side effects, and lifestyle considerations for each drug class.   CARDIAC REHAB PHASE II EXERCISE from 10/12/2017 in [place]  Date  09/28/17  Educator  pharmacy  Instruction Review Code  2- meets goals/outcomes      Cardiac Drugs II:  -Group instruction provided by verbal instruction and written materials to support subject.  Instructor reviews  cardiac drug classes: angiotensin converting enzyme inhibitors (ACE-I), angiotensin II receptor blockers (ARBs), nitrates, and calcium channel blockers.  Instructor discusses reasons, side effects, and lifestyle considerations for each drug class.   CARDIAC REHAB PHASE II EXERCISE from 10/12/2017 in [place]  Date  08/24/17  Instruction Review Code  2- meets goals/outcomes      Anatomy and Physiology of the Circulatory System:  Group verbal and written instruction and models provide basic cardiac anatomy and physiology, with the coronary electrical and arterial systems. Review of: AMI, Angina, Valve disease, Heart Failure, Peripheral Artery Disease, Cardiac Arrhythmia, Pacemakers, and the ICD.   CARDIAC REHAB PHASE II EXERCISE from 10/12/2017 in [place]  Date  10/12/17  Instruction Review Code  2- meets goals/outcomes      Other Education:  -Group or individual verbal, written, or video instructions that support the educational goals of the cardiac rehab program.   CARDIAC REHAB PHASE II EXERCISE from 10/12/2017 in [place]  Date  09/23/17 [Holiday Eating Survival Tips]  Educator  RD  Instruction Review Code  2- Demonstrated Understanding      Knowledge Questionnaire Score:   Core Components/Risk Factors/Patient Goals at Admission:   Core Components/Risk Factors/Patient Goals Review:  Goals and Risk Factor Review    Row Name 08/23/17 2542 09/16/17 1154 10/11/17 1653         Core Components/Risk Factors/Patient Goals Review   Personal Goals Review  Weight Management/Obesity;Hypertension;Diabetes;Lipids  Weight Management/Obesity;Hypertension;Diabetes;Lipids  Weight Management/Obesity;Hypertension;Diabetes;Lipids     Review  pt with multiple RF demonstrates eagerness to participate in CR exercise,nutrition and education . pt c/o continued DOE with more than usual activity,  however has noticed it is easier to perform household chores.   pt with multiple RF demonstrates eagerness to participate in CR exercise,nutrition and education . pt c/o continued DOE with more than usual activity, however continues to see improvements with  performing  household chores.   pt with multiple RF demonstrates eagerness to participate in CR exercise,nutrition and education . pt c/o continued DOE with more than usual activity, however continues to see improvements with  performing  household chores.      Expected Outcomes  pt will participate in CR exercise, nutrition and lifestyle modification to decrease overall RF.    pt will participate in CR exercise, nutrition and lifestyle modification to decrease overall RF.    pt will participate in CR exercise, nutrition and lifestyle modification to decrease overall RF.          Core Components/Risk Factors/Patient Goals at Discharge (Final Review):  Goals and Risk Factor Review - 10/11/17 1653      Core Components/Risk Factors/Patient Goals Review   Personal Goals Review  Weight Management/Obesity;Hypertension;Diabetes;Lipids    Review  pt with multiple RF demonstrates eagerness to participate in CR exercise,nutrition and education . pt c/o continued DOE with  more than usual activity, however continues to see improvements with  performing  household chores.     Expected Outcomes  pt will participate in CR exercise, nutrition and lifestyle modification to decrease overall RF.         ITP Comments: ITP Comments    Row Name 08/23/17 0711 08/23/17 1434 09/16/17 1154 10/11/17 1653     ITP Comments  30 day ITP review.  pt with good participation and attendance.  -  30 day ITP review.  pt with good participation and attendance.  30 day ITP review.  pt with good participation and attendance. pt exhibits positive interactions with peers       Comments:

## 2017-10-17 ENCOUNTER — Encounter (HOSPITAL_COMMUNITY)
Admission: RE | Admit: 2017-10-17 | Discharge: 2017-10-17 | Disposition: A | Discharge status: Home / Self Care | Payer: BC Managed Care – PPO | Source: Ambulatory Visit | Attending: Interventional Cardiology | Admitting: Interventional Cardiology

## 2017-10-17 DIAGNOSIS — Z955 Presence of coronary angioplasty implant and graft: Secondary | ICD-10-CM | POA: Diagnosis not present

## 2017-10-17 DIAGNOSIS — I2111 ST elevation (STEMI) myocardial infarction involving right coronary artery: Secondary | ICD-10-CM | POA: Insufficient documentation

## 2017-10-17 DIAGNOSIS — I213 ST elevation (STEMI) myocardial infarction of unspecified site: Secondary | ICD-10-CM

## 2017-10-17 DIAGNOSIS — Z48812 Encounter for surgical aftercare following surgery on the circulatory system: Secondary | ICD-10-CM | POA: Insufficient documentation

## 2017-10-19 ENCOUNTER — Encounter (HOSPITAL_COMMUNITY)
Admission: RE | Admit: 2017-10-19 | Discharge: 2017-10-19 | Disposition: A | Discharge status: Home / Self Care | Payer: BC Managed Care – PPO | Source: Ambulatory Visit | Attending: Interventional Cardiology | Admitting: Interventional Cardiology

## 2017-10-19 ENCOUNTER — Encounter (HOSPITAL_COMMUNITY): Payer: Self-pay

## 2017-10-19 DIAGNOSIS — I213 ST elevation (STEMI) myocardial infarction of unspecified site: Secondary | ICD-10-CM

## 2017-10-19 DIAGNOSIS — Z955 Presence of coronary angioplasty implant and graft: Secondary | ICD-10-CM | POA: Diagnosis not present

## 2017-10-21 ENCOUNTER — Encounter (HOSPITAL_COMMUNITY): Payer: BC Managed Care – PPO

## 2017-10-24 ENCOUNTER — Encounter (HOSPITAL_COMMUNITY): Payer: Self-pay

## 2017-10-25 NOTE — Progress Notes (Deleted)
Discharge Progress Report  Patient Details  Name: Jocelyn [name] MRN: [id number] Date of Birth: [birth year] Referring Provider:     [place] from 07/12/2017 in [place]  Referring Provider  Thamas Jaegers MD       Number of Visits: 36   Reason for Discharge:  Patient reached a stable level of exercise.  Smoking History:  Social History   Tobacco Use  Smoking Status Never Smoker  Smokeless Tobacco Never Used  Tobacco Comment   Daily Caffeine - 1  Exercise 2-3 times/weekly    Diagnosis:  06/15/17 Status post coronary artery stent placement  06/15/17 ST elevation myocardial infarction (STEMI), unspecified artery ([place])  ADL [facility]:   Initial Exercise Prescription: Initial Exercise Prescription - 07/12/17 1500      Date of Initial Exercise RX and Referring Provider   Date  07/12/17    Referring Provider  Thamas Jaegers MD      Treadmill   MPH  2.2    Grade  0    Minutes  10    METs  2.68      Recumbant Bike   Level  2    Watts  10    Minutes  10    METs  2.38      NuStep   Level  2    SPM  70    Minutes  10    METs  2      Track   Laps  8    Minutes  10    METs  2.39      Prescription Details   Frequency (times per week)  3    Duration  Progress to 30 minutes of continuous aerobic without signs/symptoms of physical distress      Intensity   THRR 40-80% of Max Heartrate  63-126    Ratings of Perceived Exertion  11-13    Perceived Dyspnea  0-4      Progression   Progression  Continue to progress workloads to maintain intensity without signs/symptoms of physical distress.      Resistance Training   Training Prescription  Yes    Weight  2lbs    Reps  10-15       Discharge Exercise Prescription (Final Exercise Prescription Changes): Exercise Prescription Changes - 10/19/17 1400      Response to Exercise   Blood Pressure (Admit)  104/64    Blood Pressure (Exercise)  138/70    Blood  Pressure (Exit)  114/70    Heart Rate (Admit)  64 bpm    Heart Rate (Exercise)  72 bpm    Heart Rate (Exit)  70 bpm    Rating of Perceived Exertion (Exercise)  12    Symptoms  none    Duration  Continue with 30 min of aerobic exercise without signs/symptoms of physical distress.    Intensity  THRR unchanged      Progression   Progression  Continue to progress workloads to maintain intensity without signs/symptoms of physical distress.      Resistance Training   Training Prescription  No      Treadmill   MPH  2.4    Grade  3    Minutes  15    METs  3.83      Recumbant Bike   Level  2.5    Minutes  15    METs  1.9      Home Exercise Plan  Plans to continue exercise at  Home (comment)    Frequency  Add 2 additional days to program exercise sessions.    Initial Home Exercises Provided  08/05/17       Functional Capacity: 6 Minute Walk    Row Name 07/12/17 1429 07/12/17 1506 07/12/17 1513     6 Minute Walk   Phase  Initial  -  -   Distance  1246 feet  -  -   Walk Time  6 minutes  -  -   # of Rest Breaks  0  -  -   MPH  2.4  -  -   METS  2.8  -  -   RPE  12  -  -   VO2 Peak  9.8  -  -   Symptoms  -  -  Yes (comment)   Comments  -  -  fatigue at end of walk test   Resting HR  89 bpm  -  -   Resting BP  110/78  -  -   Resting Oxygen Saturation   98 %  -  -   Exercise Oxygen Saturation  during 6 min walk  97 %  -  -   Max Ex. HR  101 bpm  -  -   Max Ex. BP  118/70  -  -   2 Minute Post BP  -  100/70  -   Row Name 10/10/17 1631         6 Minute Walk   Phase  Discharge     Distance  1362 feet     Distance % Change  9.3 %     Distance Feet Change  116 ft     Walk Time  6 minutes     # of Rest Breaks  0     MPH  2.58     METS  3.1     RPE  13     Perceived Dyspnea   2     VO2 Peak  10.9     Symptoms  Yes (comment)     Comments  Pt reported mild SOB and fatigue      Resting HR  86 bpm     Resting BP  120/80     Max Ex. HR  100 bpm     Max Ex. BP  122/80      2 Minute Post BP  108/60        Psychological, QOL, Others - Outcomes: PHQ 2/9: Depression screen [facility] 2/9 10/25/2017 07/20/2017 12/03/2014  Decreased Interest 0 0 0  Down, Depressed, Hopeless 0 0 0  PHQ - 2 Score 0 0 0  Some recent data might be hidden    Quality of Life: Quality of Life - 07/12/17 1412      Quality of Life Scores   Health/Function Pre  19.85 %    Socioeconomic Pre  24.75 %    Psych/Spiritual Pre  23.93 %    Family Pre  28.5 %    GLOBAL Pre  22.93 %       Personal Goals: Goals established at orientation with interventions provided to work toward goal. Personal Goals and Risk Factors at Admission - 07/12/17 1433      Core Components/Risk Factors/Patient Goals on Admission    Weight Management  Weight Loss;Yes    Intervention  Obesity: Provide education and appropriate resources to help participant work on and attain dietary goals.;Weight  Management/Obesity: Establish reasonable short term and long term weight goals.;Weight Management: Provide education and appropriate resources to help participant work on and attain dietary goals.;Weight Management: Develop a combined nutrition and exercise program designed to reach desired caloric intake, while maintaining appropriate intake of nutrient and fiber, sodium and fats, and appropriate energy expenditure required for the weight goal.    Admit Weight  180 lb 16 oz (82.1 kg)    Expected Outcomes  Weight Loss: Understanding of general recommendations for a balanced deficit meal plan, which promotes 1-2 lb weight loss per week and includes a negative energy balance of [phone] kcal/d;Long Term: Adherence to nutrition and physical activity/exercise program aimed toward attainment of established weight goal;Short Term: Continue to assess and modify interventions until short term weight is achieved    Diabetes  Yes    Intervention  Provide education about signs/symptoms and action to take for hypo/hyperglycemia.;Provide  education about proper nutrition, including hydration, and aerobic/resistive exercise prescription along with prescribed medications to achieve blood glucose in normal ranges: Fasting glucose 65-99 mg/dL    Expected Outcomes  Short Term: Participant verbalizes understanding of the signs/symptoms and immediate care of hyper/hypoglycemia, proper foot care and importance of medication, aerobic/resistive exercise and nutrition plan for blood glucose control.;Long Term: Attainment of HbA1C < 7%.    Hypertension  Yes    Intervention  Provide education on lifestyle modifcations including regular physical activity/exercise, weight management, moderate sodium restriction and increased consumption of fresh fruit, vegetables, and low fat dairy, alcohol moderation, and smoking cessation.;Monitor prescription use compliance.    Expected Outcomes  Short Term: Continued assessment and intervention until BP is < 140/21mm HG in hypertensive participants. < 130/21mm HG in hypertensive participants with diabetes, heart failure or chronic kidney disease.;Long Term: Maintenance of blood pressure at goal levels.    Lipids  Yes    Intervention  Provide education and support for participant on nutrition & aerobic/resistive exercise along with prescribed medications to achieve LDL 70mg , HDL >40mg .    Expected Outcomes  Short Term: Participant states understanding of desired cholesterol values and is compliant with medications prescribed. Participant is following exercise prescription and nutrition guidelines.;Long Term: Cholesterol controlled with medications as prescribed, with individualized exercise RX and with personalized nutrition plan. Value goals: LDL < 70mg , HDL > 40 mg.        Personal Goals Discharge: Goals and Risk Factor Review    Row Name 07/27/17 1759 08/23/17 3009 09/16/17 1154 10/11/17 1653 10/19/17 1521     Core Components/Risk Factors/Patient Goals Review   Personal Goals Review  Weight  Management/Obesity;Hypertension;Diabetes;Lipids  Weight Management/Obesity;Hypertension;Diabetes;Lipids  Weight Management/Obesity;Hypertension;Diabetes;Lipids  Weight Management/Obesity;Hypertension;Diabetes;Lipids  Weight Management/Obesity;Hypertension;Diabetes;Lipids   Review  pt with multiple RF demonstrates eagerness to participate in CR exercise,nutrition and education .  pt with multiple RF demonstrates eagerness to participate in CR exercise,nutrition and education . pt c/o continued DOE with more than usual activity, however has noticed it is easier to perform household chores.   pt with multiple RF demonstrates eagerness to participate in CR exercise,nutrition and education . pt c/o continued DOE with more than usual activity, however continues to see improvements with  performing  household chores.   pt with multiple RF demonstrates eagerness to participate in CR exercise,nutrition and education . pt c/o continued DOE with more than usual activity, however continues to see improvements with  performing  household chores.   pt feels she has made significant improvement in strength/stamina.  pt DOE continues however improved.  pt plans to  continue exercising in cardiac maintenance program.    Expected Outcomes  pt will participate in CR exercise, nutrition and lifestyle modification to decrease overall RF.    pt will participate in CR exercise, nutrition and lifestyle modification to decrease overall RF.    pt will participate in CR exercise, nutrition and lifestyle modification to decrease overall RF.    pt will participate in CR exercise, nutrition and lifestyle modification to decrease overall RF.    pt will participate in CR maintenance exercise, nutrition and lifestyle modification to decrease overall RF.     [place] Name 10/21/17 0756             Core Components/Risk Factors/Patient Goals Review   Personal Goals Review  Weight Management/Obesity       Review  See nutrition section           Exercise Goals and Review: Exercise Goals    Row Name 07/12/17 1507 07/12/17 1508           Exercise Goals   Increase Physical Activity  Yes  -      Intervention  Provide advice, education, support and counseling about physical activity/exercise needs.;Develop an individualized exercise prescription for aerobic and resistive training based on initial evaluation findings, risk stratification, comorbidities and participant's personal goals.  -      Expected Outcomes  Achievement of increased cardiorespiratory fitness and enhanced flexibility, muscular endurance and strength shown through measurements of functional capacity and personal statement of participant.  -      Increase Strength and Stamina  Yes  -      Intervention  Provide advice, education, support and counseling about physical activity/exercise needs.;Develop an individualized exercise prescription for aerobic and resistive training based on initial evaluation findings, risk stratification, comorbidities and participant's personal goals.  -      Expected Outcomes  Achievement of increased cardiorespiratory fitness and enhanced flexibility, muscular endurance and strength shown through measurements of functional capacity and personal statement of participant.  -      Able to understand and use rate of perceived exertion (RPE) scale  -  Yes      Intervention  -  Provide education and explanation on how to use RPE scale      Expected Outcomes  -  Long Term:  Able to use RPE to guide intensity level when exercising independently;Short Term: Able to use RPE daily in rehab to express subjective intensity level      Knowledge and understanding of Target Heart Rate Range (THRR)  -  Yes      Intervention  -  Provide education and explanation of THRR including how the numbers were predicted and where they are located for reference      Expected Outcomes  -  Short Term: Able to state/look up THRR;Short Term: Able to use daily as guideline for  intensity in rehab;Long Term: Able to use THRR to govern intensity when exercising independently      Able to check pulse independently  -  Yes      Understanding of Exercise Prescription  -  Yes      Intervention  -  Provide education, explanation, and written materials on patient's individual exercise prescription      Expected Outcomes  -  Long Term: Able to explain home exercise prescription to exercise independently;Short Term: Able to explain program exercise prescription         Nutrition & Weight - Outcomes: Pre Biometrics - 10/12/17 1122  Pre Biometrics   % Body Fat  43.2 %      Post Biometrics - 10/12/17 1123       Post  Biometrics   % Body Fat  41.6 %       Nutrition: Nutrition Therapy & Goals - 07/13/17 1118      Nutrition Therapy   Diet  Carb Modified, Therapeutic Lifestyle Changes      Personal Nutrition Goals   Nutrition Goal  Pt to identify and limit food sources of saturated fat, trans fat, and sodium    Personal Goal #2  Pt to identify food quantities necessary to achieve weight loss of 6-24 lb (2.7-10.9 kg) at graduation from cardiac rehab.       Intervention Plan   Intervention  Prescribe, educate and counsel regarding individualized specific dietary modifications aiming towards targeted core components such as weight, hypertension, lipid management, diabetes, heart failure and other comorbidities.    Expected Outcomes  Short Term Goal: Understand basic principles of dietary content, such as calories, fat, sodium, cholesterol and nutrients.;Long Term Goal: Adherence to prescribed nutrition plan.       Nutrition Discharge: Nutrition Assessments - 07/13/17 1118      MEDFICTS Scores   Pre Score  44       Education Questionnaire Score: Knowledge Questionnaire Score - 07/12/17 1412      Knowledge Questionnaire Score   Pre Score  22/24       Goals reviewed with patient; copy given to patient.

## 2017-10-25 NOTE — Addendum Note (Signed)
Encounter addended by: Lowell Guitar, RN on: 10/25/2017 12:08 PM  Actions taken: Visit Navigator Flowsheet section accepted, Sign clinical note, Episode resolved

## 2017-10-25 NOTE — Progress Notes (Signed)
Discharge Progress Report  Patient Details  Name: Jocelyn [name] MRN: [id number] Date of Birth: 02-19-1956 Referring Provider:     [place] from 07/12/2017 in [facility]  Referring Provider  Thamas Jaegers MD       Number of Visits: 36  Reason for Discharge:  Patient reached a stable level of exercise. pt met program and personal goals.  Pt plans to continue exercise in cardiac maintenance program.    Smoking History:  Social History   Tobacco Use  Smoking Status Never Smoker  Smokeless Tobacco Never Used  Tobacco Comment   Daily Caffeine - 1  Exercise 2-3 times/weekly    Diagnosis:  06/15/17 ST elevation myocardial infarction (STEMI), unspecified artery ([place])  06/15/17 Status post coronary artery stent placement  ADL [facility]:   Initial Exercise Prescription: Initial Exercise Prescription - 07/12/17 1500      Date of Initial Exercise RX and Referring Provider   Date  07/12/17    Referring Provider  Thamas Jaegers MD      Treadmill   MPH  2.2    Grade  0    Minutes  10    METs  2.68      Recumbant Bike   Level  2    Watts  10    Minutes  10    METs  2.38      NuStep   Level  2    SPM  70    Minutes  10    METs  2      Track   Laps  8    Minutes  10    METs  2.39      Prescription Details   Frequency (times per week)  3    Duration  Progress to 30 minutes of continuous aerobic without signs/symptoms of physical distress      Intensity   THRR 40-80% of Max Heartrate  63-126    Ratings of Perceived Exertion  11-13    Perceived Dyspnea  0-4      Progression   Progression  Continue to progress workloads to maintain intensity without signs/symptoms of physical distress.      Resistance Training   Training Prescription  Yes    Weight  2lbs    Reps  10-15       Discharge Exercise Prescription (Final Exercise Prescription Changes): Exercise Prescription Changes - 10/19/17 1400       Response to Exercise   Blood Pressure (Admit)  104/64    Blood Pressure (Exercise)  138/70    Blood Pressure (Exit)  114/70    Heart Rate (Admit)  64 bpm    Heart Rate (Exercise)  72 bpm    Heart Rate (Exit)  70 bpm    Rating of Perceived Exertion (Exercise)  12    Symptoms  none    Duration  Continue with 30 min of aerobic exercise without signs/symptoms of physical distress.    Intensity  THRR unchanged      Progression   Progression  Continue to progress workloads to maintain intensity without signs/symptoms of physical distress.      Resistance Training   Training Prescription  No      Treadmill   MPH  2.4    Grade  3    Minutes  15    METs  3.83      Recumbant Bike   Level  2.5    Minutes  15    METs  1.9      Home Exercise Plan   Plans to continue exercise at  Home (comment)    Frequency  Add 2 additional days to program exercise sessions.    Initial Home Exercises Provided  08/05/17       Functional Capacity: 6 Minute Walk    Row Name 07/12/17 1429 07/12/17 1506 07/12/17 1513     6 Minute Walk   Phase  Initial  -  -   Distance  1246 feet  -  -   Walk Time  6 minutes  -  -   # of Rest Breaks  0  -  -   MPH  2.4  -  -   METS  2.8  -  -   RPE  12  -  -   VO2 Peak  9.8  -  -   Symptoms  -  -  Yes (comment)   Comments  -  -  fatigue at end of walk test   Resting HR  89 bpm  -  -   Resting BP  110/78  -  -   Resting Oxygen Saturation   98 %  -  -   Exercise Oxygen Saturation  during 6 min walk  97 %  -  -   Max Ex. HR  101 bpm  -  -   Max Ex. BP  118/70  -  -   2 Minute Post BP  -  100/70  -   Row Name 10/10/17 1631         6 Minute Walk   Phase  Discharge     Distance  1362 feet     Distance % Change  9.3 %     Distance Feet Change  116 ft     Walk Time  6 minutes     # of Rest Breaks  0     MPH  2.58     METS  3.1     RPE  13     Perceived Dyspnea   2     VO2 Peak  10.9     Symptoms  Yes (comment)     Comments  Pt reported mild SOB and  fatigue      Resting HR  86 bpm     Resting BP  120/80     Max Ex. HR  100 bpm     Max Ex. BP  122/80     2 Minute Post BP  108/60        Psychological, QOL, Others - Outcomes: PHQ 2/9: Depression screen [facility] 2/9 10/25/2017 07/20/2017 12/03/2014  Decreased Interest 0 0 0  Down, Depressed, Hopeless 0 0 0  PHQ - 2 Score 0 0 0  Some recent data might be hidden    Quality of Life: Quality of Life - 07/12/17 1412      Quality of Life Scores   Health/Function Pre  19.85 %    Socioeconomic Pre  24.75 %    Psych/Spiritual Pre  23.93 %    Family Pre  28.5 %    GLOBAL Pre  22.93 %       Personal Goals: Goals established at orientation with interventions provided to work toward goal. Personal Goals and Risk Factors at Admission - 07/12/17 1433      Core Components/Risk Factors/Patient Goals on Admission    Weight Management  Weight Loss;Yes  Intervention  Obesity: Provide education and appropriate resources to help participant work on and attain dietary goals.;Weight Management/Obesity: Establish reasonable short term and long term weight goals.;Weight Management: Provide education and appropriate resources to help participant work on and attain dietary goals.;Weight Management: Develop a combined nutrition and exercise program designed to reach desired caloric intake, while maintaining appropriate intake of nutrient and fiber, sodium and fats, and appropriate energy expenditure required for the weight goal.    Admit Weight  180 lb 16 oz (82.1 kg)    Expected Outcomes  Weight Loss: Understanding of general recommendations for a balanced deficit meal plan, which promotes 1-2 lb weight loss per week and includes a negative energy balance of [phone] kcal/d;Long Term: Adherence to nutrition and physical activity/exercise program aimed toward attainment of established weight goal;Short Term: Continue to assess and modify interventions until short term weight is achieved    Diabetes  Yes     Intervention  Provide education about signs/symptoms and action to take for hypo/hyperglycemia.;Provide education about proper nutrition, including hydration, and aerobic/resistive exercise prescription along with prescribed medications to achieve blood glucose in normal ranges: Fasting glucose 65-99 mg/dL    Expected Outcomes  Short Term: Participant verbalizes understanding of the signs/symptoms and immediate care of hyper/hypoglycemia, proper foot care and importance of medication, aerobic/resistive exercise and nutrition plan for blood glucose control.;Long Term: Attainment of HbA1C < 7%.    Hypertension  Yes    Intervention  Provide education on lifestyle modifcations including regular physical activity/exercise, weight management, moderate sodium restriction and increased consumption of fresh fruit, vegetables, and low fat dairy, alcohol moderation, and smoking cessation.;Monitor prescription use compliance.    Expected Outcomes  Short Term: Continued assessment and intervention until BP is < 140/82m HG in hypertensive participants. < 130/826mHG in hypertensive participants with diabetes, heart failure or chronic kidney disease.;Long Term: Maintenance of blood pressure at goal levels.    Lipids  Yes    Intervention  Provide education and support for participant on nutrition & aerobic/resistive exercise along with prescribed medications to achieve LDL <7055mHDL >53m42m  Expected Outcomes  Short Term: Participant states understanding of desired cholesterol values and is compliant with medications prescribed. Participant is following exercise prescription and nutrition guidelines.;Long Term: Cholesterol controlled with medications as prescribed, with individualized exercise RX and with personalized nutrition plan. Value goals: LDL < 70mg69mL > 40 mg.        Personal Goals Discharge: Goals and Risk Factor Review    Row Name 07/27/17 1759 08/23/17 0712 32952/[id number] 1154 10/11/17 1653 10/19/17 1521      Core Components/Risk Factors/Patient Goals Review   Personal Goals Review  Weight Management/Obesity;Hypertension;Diabetes;Lipids  Weight Management/Obesity;Hypertension;Diabetes;Lipids  Weight Management/Obesity;Hypertension;Diabetes;Lipids  Weight Management/Obesity;Hypertension;Diabetes;Lipids  Weight Management/Obesity;Hypertension;Diabetes;Lipids   Review  pt with multiple RF demonstrates eagerness to participate in CR exercise,nutrition and education .  pt with multiple RF demonstrates eagerness to participate in CR exercise,nutrition and education . pt c/o continued DOE with more than usual activity, however has noticed it is easier to perform household chores.   pt with multiple RF demonstrates eagerness to participate in CR exercise,nutrition and education . pt c/o continued DOE with more than usual activity, however continues to see improvements with  performing  household chores.   pt with multiple RF demonstrates eagerness to participate in CR exercise,nutrition and education . pt c/o continued DOE with more than usual activity, however continues to see improvements with  performing  household chores.   pt feels  she has made significant improvement in strength/stamina.  pt DOE continues however improved.  pt plans to continue exercising in cardiac maintenance program.    Expected Outcomes  pt will participate in CR exercise, nutrition and lifestyle modification to decrease overall RF.    pt will participate in CR exercise, nutrition and lifestyle modification to decrease overall RF.    pt will participate in CR exercise, nutrition and lifestyle modification to decrease overall RF.    pt will participate in CR exercise, nutrition and lifestyle modification to decrease overall RF.    pt will participate in CR maintenance exercise, nutrition and lifestyle modification to decrease overall RF.     [place] Name 10/21/17 0756             Core Components/Risk Factors/Patient Goals Review   Personal  Goals Review  Weight Management/Obesity       Review  See nutrition section          Exercise Goals and Review: Exercise Goals    Row Name 07/12/17 1507 07/12/17 1508           Exercise Goals   Increase Physical Activity  Yes  -      Intervention  Provide advice, education, support and counseling about physical activity/exercise needs.;Develop an individualized exercise prescription for aerobic and resistive training based on initial evaluation findings, risk stratification, comorbidities and participant's personal goals.  -      Expected Outcomes  Achievement of increased cardiorespiratory fitness and enhanced flexibility, muscular endurance and strength shown through measurements of functional capacity and personal statement of participant.  -      Increase Strength and Stamina  Yes  -      Intervention  Provide advice, education, support and counseling about physical activity/exercise needs.;Develop an individualized exercise prescription for aerobic and resistive training based on initial evaluation findings, risk stratification, comorbidities and participant's personal goals.  -      Expected Outcomes  Achievement of increased cardiorespiratory fitness and enhanced flexibility, muscular endurance and strength shown through measurements of functional capacity and personal statement of participant.  -      Able to understand and use rate of perceived exertion (RPE) scale  -  Yes      Intervention  -  Provide education and explanation on how to use RPE scale      Expected Outcomes  -  Long Term:  Able to use RPE to guide intensity level when exercising independently;Short Term: Able to use RPE daily in rehab to express subjective intensity level      Knowledge and understanding of Target Heart Rate Range (THRR)  -  Yes      Intervention  -  Provide education and explanation of THRR including how the numbers were predicted and where they are located for reference      Expected Outcomes  -   Short Term: Able to state/look up THRR;Short Term: Able to use daily as guideline for intensity in rehab;Long Term: Able to use THRR to govern intensity when exercising independently      Able to check pulse independently  -  Yes      Understanding of Exercise Prescription  -  Yes      Intervention  -  Provide education, explanation, and written materials on patient's individual exercise prescription      Expected Outcomes  -  Long Term: Able to explain home exercise prescription to exercise independently;Short Term: Able to explain program exercise prescription  Nutrition & Weight - Outcomes: Pre Biometrics - 10/12/17 1122      Pre Biometrics   % Body Fat  43.2 %      Post Biometrics - 10/12/17 1123       Post  Biometrics   % Body Fat  41.6 %       Nutrition: Nutrition Therapy & Goals - 07/13/17 1118      Nutrition Therapy   Diet  Carb Modified, Therapeutic Lifestyle Changes      Personal Nutrition Goals   Nutrition Goal  Pt to identify and limit food sources of saturated fat, trans fat, and sodium    Personal Goal #2  Pt to identify food quantities necessary to achieve weight loss of 6-24 lb (2.7-10.9 kg) at graduation from cardiac rehab.       Intervention Plan   Intervention  Prescribe, educate and counsel regarding individualized specific dietary modifications aiming towards targeted core components such as weight, hypertension, lipid management, diabetes, heart failure and other comorbidities.    Expected Outcomes  Short Term Goal: Understand basic principles of dietary content, such as calories, fat, sodium, cholesterol and nutrients.;Long Term Goal: Adherence to prescribed nutrition plan.       Nutrition Discharge: Nutrition Assessments - 07/13/17 1118      MEDFICTS Scores   Pre Score  44       Education Questionnaire Score: Knowledge Questionnaire Score - 07/12/17 1412      Knowledge Questionnaire Score   Pre Score  22/24       Goals reviewed with  patient; copy given to patient.

## 2017-10-25 NOTE — Addendum Note (Signed)
Encounter addended by: Lowell Guitar, RN on: 10/25/2017 12:11 PM  Actions taken: Delete clinical note

## 2017-10-26 ENCOUNTER — Encounter (HOSPITAL_COMMUNITY): Payer: Self-pay

## 2017-10-26 ENCOUNTER — Telehealth (HOSPITAL_COMMUNITY): Payer: Self-pay | Admitting: *Deleted

## 2017-10-26 DIAGNOSIS — Z955 Presence of coronary angioplasty implant and graft: Secondary | ICD-10-CM | POA: Insufficient documentation

## 2017-10-26 DIAGNOSIS — I2111 ST elevation (STEMI) myocardial infarction involving right coronary artery: Secondary | ICD-10-CM | POA: Insufficient documentation

## 2017-10-28 ENCOUNTER — Encounter (HOSPITAL_COMMUNITY): Payer: Self-pay

## 2017-10-28 ENCOUNTER — Encounter (HOSPITAL_COMMUNITY)
Admission: RE | Admit: 2017-10-28 | Discharge: 2017-10-28 | Disposition: A | Discharge status: Home / Self Care | Payer: Self-pay | Source: Ambulatory Visit | Attending: Interventional Cardiology | Admitting: Interventional Cardiology

## 2017-10-28 NOTE — Patient Instructions (Addendum)
Discharge Instructions  Patient Details  Name: Jocelyn [name] MRN: [id number] Date of Birth: [birth year] Referring Provider:  Noralee Space, MD   Number of Visits: 36  Reason for Discharge:  Patient has met program and personal goals.  Smoking History:  Social History   Tobacco Use  Smoking Status Never Smoker  Smokeless Tobacco Never Used  Tobacco Comment   Daily Caffeine - 1  Exercise 2-3 times/weekly    Diagnosis:  06/15/17 ST elevation myocardial infarction (STEMI), unspecified artery ([place])  06/15/17 Status post coronary artery stent placement  Initial Exercise Prescription: Initial Exercise Prescription - 07/12/17 1500      Date of Initial Exercise RX and Referring Provider   Date  07/12/17    Referring Provider  Thamas Jaegers MD      Treadmill   MPH  2.2    Grade  0    Minutes  10    METs  2.68      Recumbant Bike   Level  2    Watts  10    Minutes  10    METs  2.38      NuStep   Level  2    SPM  70    Minutes  10    METs  2      Track   Laps  8    Minutes  10    METs  2.39      Prescription Details   Frequency (times per week)  3    Duration  Progress to 30 minutes of continuous aerobic without signs/symptoms of physical distress      Intensity   THRR 40-80% of Max Heartrate  63-126    Ratings of Perceived Exertion  11-13    Perceived Dyspnea  0-4      Progression   Progression  Continue to progress workloads to maintain intensity without signs/symptoms of physical distress.      Resistance Training   Training Prescription  Yes    Weight  2lbs    Reps  10-15       Discharge Exercise Prescription (Final Exercise Prescription Changes): Exercise Prescription Changes - 10/19/17 1400      Response to Exercise   Blood Pressure (Admit)  104/64    Blood Pressure (Exercise)  138/70    Blood Pressure (Exit)  114/70    Heart Rate (Admit)  64 bpm    Heart Rate (Exercise)  72 bpm    Heart Rate (Exit)  70 bpm    Rating of Perceived  Exertion (Exercise)  12    Symptoms  none    Duration  Continue with 30 min of aerobic exercise without signs/symptoms of physical distress.    Intensity  THRR unchanged      Progression   Progression  Continue to progress workloads to maintain intensity without signs/symptoms of physical distress.      Resistance Training   Training Prescription  No      Treadmill   MPH  2.4    Grade  3    Minutes  15    METs  3.83      Recumbant Bike   Level  2.5    Minutes  15    METs  1.9      Home Exercise Plan   Plans to continue exercise at  Home (comment)    Frequency  Add 2 additional days to program exercise sessions.    Initial Home Exercises Provided  08/05/17  Functional Capacity: 6 Minute Walk    Row Name 07/12/17 1429 07/12/17 1506 07/12/17 1513     6 Minute Walk   Phase  Initial  -  -   Distance  1246 feet  -  -   Walk Time  6 minutes  -  -   # of Rest Breaks  0  -  -   MPH  2.4  -  -   METS  2.8  -  -   RPE  12  -  -   VO2 Peak  9.8  -  -   Symptoms  -  -  Yes (comment)   Comments  -  -  fatigue at end of walk test   Resting HR  89 bpm  -  -   Resting BP  110/78  -  -   Resting Oxygen Saturation   98 %  -  -   Exercise Oxygen Saturation  during 6 min walk  97 %  -  -   Max Ex. HR  101 bpm  -  -   Max Ex. BP  118/70  -  -   2 Minute Post BP  -  100/70  -   Row Name 10/10/17 1631         6 Minute Walk   Phase  Discharge     Distance  1362 feet     Distance % Change  9.3 %     Distance Feet Change  116 ft     Walk Time  6 minutes     # of Rest Breaks  0     MPH  2.58     METS  3.1     RPE  13     Perceived Dyspnea   2     VO2 Peak  10.9     Symptoms  Yes (comment)     Comments  Pt reported mild SOB and fatigue      Resting HR  86 bpm     Resting BP  120/80     Max Ex. HR  100 bpm     Max Ex. BP  122/80     2 Minute Post BP  108/60        Quality of Life: Quality of Life - 07/12/17 1412      Quality of Life Scores   Health/Function Pre   19.85 %    Socioeconomic Pre  24.75 %    Psych/Spiritual Pre  23.93 %    Family Pre  28.5 %    GLOBAL Pre  22.93 %       Personal Goals: Goals established at orientation with interventions provided to work toward goal. Personal Goals and Risk Factors at Admission - 07/12/17 1433      Core Components/Risk Factors/Patient Goals on Admission    Weight Management  Weight Loss;Yes    Intervention  Obesity: Provide education and appropriate resources to help participant work on and attain dietary goals.;Weight Management/Obesity: Establish reasonable short term and long term weight goals.;Weight Management: Provide education and appropriate resources to help participant work on and attain dietary goals.;Weight Management: Develop a combined nutrition and exercise program designed to reach desired caloric intake, while maintaining appropriate intake of nutrient and fiber, sodium and fats, and appropriate energy expenditure required for the weight goal.    Admit Weight  180 lb 16 oz (82.1 kg)    Expected Outcomes  Weight Loss: Understanding of  general recommendations for a balanced deficit meal plan, which promotes 1-2 lb weight loss per week and includes a negative energy balance of [phone] kcal/d;Long Term: Adherence to nutrition and physical activity/exercise program aimed toward attainment of established weight goal;Short Term: Continue to assess and modify interventions until short term weight is achieved    Diabetes  Yes    Intervention  Provide education about signs/symptoms and action to take for hypo/hyperglycemia.;Provide education about proper nutrition, including hydration, and aerobic/resistive exercise prescription along with prescribed medications to achieve blood glucose in normal ranges: Fasting glucose 65-99 mg/dL    Expected Outcomes  Short Term: Participant verbalizes understanding of the signs/symptoms and immediate care of hyper/hypoglycemia, proper foot care and importance of  medication, aerobic/resistive exercise and nutrition plan for blood glucose control.;Long Term: Attainment of HbA1C < 7%.    Hypertension  Yes    Intervention  Provide education on lifestyle modifcations including regular physical activity/exercise, weight management, moderate sodium restriction and increased consumption of fresh fruit, vegetables, and low fat dairy, alcohol moderation, and smoking cessation.;Monitor prescription use compliance.    Expected Outcomes  Short Term: Continued assessment and intervention until BP is < 140/86m HG in hypertensive participants. < 130/887mHG in hypertensive participants with diabetes, heart failure or chronic kidney disease.;Long Term: Maintenance of blood pressure at goal levels.    Lipids  Yes    Intervention  Provide education and support for participant on nutrition & aerobic/resistive exercise along with prescribed medications to achieve LDL <7025mHDL >10m58m  Expected Outcomes  Short Term: Participant states understanding of desired cholesterol values and is compliant with medications prescribed. Participant is following exercise prescription and nutrition guidelines.;Long Term: Cholesterol controlled with medications as prescribed, with individualized exercise RX and with personalized nutrition plan. Value goals: LDL < 70mg47mL > 40 mg.        Personal Goals Discharge: Goals and Risk Factor Review - 10/21/17 0756      Core Components/Risk Factors/Patient Goals Review   Personal Goals Review  Weight Management/Obesity    Review  See nutrition section       Exercise Goals and Review: Exercise Goals    Row Name 07/12/17 1507 07/12/17 1508           Exercise Goals   Increase Physical Activity  Yes  -      Intervention  Provide advice, education, support and counseling about physical activity/exercise needs.;Develop an individualized exercise prescription for aerobic and resistive training based on initial evaluation findings, risk  stratification, comorbidities and participant's personal goals.  -      Expected Outcomes  Achievement of increased cardiorespiratory fitness and enhanced flexibility, muscular endurance and strength shown through measurements of functional capacity and personal statement of participant.  -      Increase Strength and Stamina  Yes  -      Intervention  Provide advice, education, support and counseling about physical activity/exercise needs.;Develop an individualized exercise prescription for aerobic and resistive training based on initial evaluation findings, risk stratification, comorbidities and participant's personal goals.  -      Expected Outcomes  Achievement of increased cardiorespiratory fitness and enhanced flexibility, muscular endurance and strength shown through measurements of functional capacity and personal statement of participant.  -      Able to understand and use rate of perceived exertion (RPE) scale  -  Yes      Intervention  -  Provide education and explanation on how to use RPE scale  Expected Outcomes  -  Long Term:  Able to use RPE to guide intensity level when exercising independently;Short Term: Able to use RPE daily in rehab to express subjective intensity level      Knowledge and understanding of Target Heart Rate Range (THRR)  -  Yes      Intervention  -  Provide education and explanation of THRR including how the numbers were predicted and where they are located for reference      Expected Outcomes  -  Short Term: Able to state/look up THRR;Short Term: Able to use daily as guideline for intensity in rehab;Long Term: Able to use THRR to govern intensity when exercising independently      Able to check pulse independently  -  Yes      Understanding of Exercise Prescription  -  Yes      Intervention  -  Provide education, explanation, and written materials on patient's individual exercise prescription      Expected Outcomes  -  Long Term: Able to explain home exercise  prescription to exercise independently;Short Term: Able to explain program exercise prescription         Nutrition & Weight - Outcomes: Pre Biometrics - 10/12/17 1122      Pre Biometrics   % Body Fat  43.2 %      Post Biometrics - 10/12/17 1123       Post  Biometrics   % Body Fat  41.6 %       Nutrition: Nutrition Therapy & Goals - 07/13/17 1118      Nutrition Therapy   Diet  Carb Modified, Therapeutic Lifestyle Changes      Personal Nutrition Goals   Nutrition Goal  Pt to identify and limit food sources of saturated fat, trans fat, and sodium    Personal Goal #2  Pt to identify food quantities necessary to achieve weight loss of 6-24 lb (2.7-10.9 kg) at graduation from cardiac rehab.       Intervention Plan   Intervention  Prescribe, educate and counsel regarding individualized specific dietary modifications aiming towards targeted core components such as weight, hypertension, lipid management, diabetes, heart failure and other comorbidities.    Expected Outcomes  Short Term Goal: Understand basic principles of dietary content, such as calories, fat, sodium, cholesterol and nutrients.;Long Term Goal: Adherence to prescribed nutrition plan.       Nutrition Discharge: Nutrition Assessments - 07/13/17 1118      MEDFICTS Scores   Pre Score  44       Education Questionnaire Score: Knowledge Questionnaire Score - 07/12/17 1412      Knowledge Questionnaire Score   Pre Score  22/24       Goals reviewed with patient, copy given to patient.

## 2017-10-30 ENCOUNTER — Other Ambulatory Visit: Payer: Self-pay | Admitting: Pulmonary Disease

## 2017-10-31 ENCOUNTER — Encounter (HOSPITAL_COMMUNITY)
Admission: RE | Admit: 2017-10-31 | Discharge: 2017-10-31 | Disposition: A | Discharge status: Home / Self Care | Payer: Self-pay | Source: Ambulatory Visit | Attending: Interventional Cardiology | Admitting: Interventional Cardiology

## 2017-11-02 ENCOUNTER — Encounter (HOSPITAL_COMMUNITY)
Admission: RE | Admit: 2017-11-02 | Discharge: 2017-11-02 | Disposition: A | Discharge status: Home / Self Care | Payer: Self-pay | Source: Ambulatory Visit | Attending: Interventional Cardiology | Admitting: Interventional Cardiology

## 2017-11-04 ENCOUNTER — Encounter (HOSPITAL_COMMUNITY): Payer: Self-pay

## 2017-11-09 ENCOUNTER — Encounter (HOSPITAL_COMMUNITY): Payer: Self-pay

## 2017-11-11 ENCOUNTER — Encounter (HOSPITAL_COMMUNITY): Payer: Self-pay

## 2017-11-14 ENCOUNTER — Encounter (HOSPITAL_COMMUNITY): Payer: Self-pay

## 2017-11-16 ENCOUNTER — Encounter (HOSPITAL_COMMUNITY)
Admission: RE | Admit: 2017-11-16 | Discharge: 2017-11-16 | Disposition: A | Discharge status: Home / Self Care | Payer: Self-pay | Source: Ambulatory Visit | Attending: Interventional Cardiology | Admitting: Interventional Cardiology

## 2017-11-16 DIAGNOSIS — I2111 ST elevation (STEMI) myocardial infarction involving right coronary artery: Secondary | ICD-10-CM | POA: Insufficient documentation

## 2017-11-16 DIAGNOSIS — Z955 Presence of coronary angioplasty implant and graft: Secondary | ICD-10-CM | POA: Insufficient documentation

## 2017-11-18 ENCOUNTER — Encounter (HOSPITAL_COMMUNITY): Payer: Self-pay

## 2017-11-21 ENCOUNTER — Encounter (HOSPITAL_COMMUNITY): Payer: Self-pay

## 2017-11-23 ENCOUNTER — Encounter (HOSPITAL_COMMUNITY)
Admission: RE | Admit: 2017-11-23 | Discharge: 2017-11-23 | Disposition: A | Discharge status: Home / Self Care | Payer: Self-pay | Source: Ambulatory Visit | Attending: Interventional Cardiology | Admitting: Interventional Cardiology

## 2017-11-25 ENCOUNTER — Encounter (HOSPITAL_COMMUNITY)
Admission: RE | Admit: 2017-11-25 | Discharge: 2017-11-25 | Disposition: A | Discharge status: Home / Self Care | Payer: Self-pay | Source: Ambulatory Visit | Attending: Interventional Cardiology | Admitting: Interventional Cardiology

## 2017-11-28 ENCOUNTER — Encounter (HOSPITAL_COMMUNITY)
Admission: RE | Admit: 2017-11-28 | Discharge: 2017-11-28 | Disposition: A | Discharge status: Home / Self Care | Payer: Self-pay | Source: Ambulatory Visit | Attending: Interventional Cardiology | Admitting: Interventional Cardiology

## 2017-11-30 ENCOUNTER — Encounter (HOSPITAL_COMMUNITY)
Admission: RE | Admit: 2017-11-30 | Discharge: 2017-11-30 | Disposition: A | Discharge status: Home / Self Care | Payer: Self-pay | Source: Ambulatory Visit | Attending: Interventional Cardiology | Admitting: Interventional Cardiology

## 2017-12-02 ENCOUNTER — Encounter (HOSPITAL_COMMUNITY)
Admission: RE | Admit: 2017-12-02 | Discharge: 2017-12-02 | Disposition: A | Discharge status: Home / Self Care | Payer: Self-pay | Source: Ambulatory Visit | Attending: Interventional Cardiology | Admitting: Interventional Cardiology

## 2017-12-05 ENCOUNTER — Encounter (HOSPITAL_COMMUNITY)
Admission: RE | Admit: 2017-12-05 | Discharge: 2017-12-05 | Disposition: A | Discharge status: Home / Self Care | Payer: BC Managed Care – PPO | Source: Ambulatory Visit | Attending: Interventional Cardiology | Admitting: Interventional Cardiology

## 2017-12-07 ENCOUNTER — Encounter (HOSPITAL_COMMUNITY)
Admission: RE | Admit: 2017-12-07 | Discharge: 2017-12-07 | Disposition: A | Discharge status: Home / Self Care | Payer: Self-pay | Source: Ambulatory Visit | Attending: Interventional Cardiology | Admitting: Interventional Cardiology

## 2017-12-09 ENCOUNTER — Encounter (HOSPITAL_COMMUNITY)
Admission: RE | Admit: 2017-12-09 | Discharge: 2017-12-09 | Disposition: A | Discharge status: Home / Self Care | Payer: BC Managed Care – PPO | Source: Ambulatory Visit | Attending: Interventional Cardiology | Admitting: Interventional Cardiology

## 2017-12-12 ENCOUNTER — Encounter (HOSPITAL_COMMUNITY)
Admission: RE | Admit: 2017-12-12 | Discharge: 2017-12-12 | Disposition: A | Discharge status: Home / Self Care | Payer: Self-pay | Source: Ambulatory Visit | Attending: Interventional Cardiology | Admitting: Interventional Cardiology

## 2017-12-14 ENCOUNTER — Encounter (HOSPITAL_COMMUNITY)
Admission: RE | Admit: 2017-12-14 | Discharge: 2017-12-14 | Disposition: A | Discharge status: Home / Self Care | Payer: Self-pay | Source: Ambulatory Visit | Attending: Interventional Cardiology | Admitting: Interventional Cardiology

## 2017-12-16 ENCOUNTER — Encounter (HOSPITAL_COMMUNITY)
Admission: RE | Admit: 2017-12-16 | Discharge: 2017-12-16 | Disposition: A | Discharge status: Home / Self Care | Payer: Self-pay | Source: Ambulatory Visit | Attending: Interventional Cardiology | Admitting: Interventional Cardiology

## 2017-12-16 DIAGNOSIS — I2111 ST elevation (STEMI) myocardial infarction involving right coronary artery: Secondary | ICD-10-CM | POA: Insufficient documentation

## 2017-12-16 DIAGNOSIS — Z955 Presence of coronary angioplasty implant and graft: Secondary | ICD-10-CM | POA: Insufficient documentation

## 2017-12-19 ENCOUNTER — Encounter (HOSPITAL_COMMUNITY)
Admission: RE | Admit: 2017-12-19 | Discharge: 2017-12-19 | Disposition: A | Discharge status: Home / Self Care | Payer: Self-pay | Source: Ambulatory Visit | Attending: Interventional Cardiology | Admitting: Interventional Cardiology

## 2017-12-21 ENCOUNTER — Encounter (HOSPITAL_COMMUNITY)
Admission: RE | Admit: 2017-12-21 | Discharge: 2017-12-21 | Disposition: A | Discharge status: Home / Self Care | Payer: Self-pay | Source: Ambulatory Visit | Attending: Interventional Cardiology | Admitting: Interventional Cardiology

## 2017-12-23 ENCOUNTER — Encounter (HOSPITAL_COMMUNITY)
Admission: RE | Admit: 2017-12-23 | Discharge: 2017-12-23 | Disposition: A | Discharge status: Home / Self Care | Payer: Self-pay | Source: Ambulatory Visit | Attending: Interventional Cardiology | Admitting: Interventional Cardiology

## 2017-12-26 ENCOUNTER — Encounter (HOSPITAL_COMMUNITY)
Admission: RE | Admit: 2017-12-26 | Discharge: 2017-12-26 | Disposition: A | Discharge status: Home / Self Care | Payer: Self-pay | Source: Ambulatory Visit | Attending: Interventional Cardiology | Admitting: Interventional Cardiology

## 2017-12-28 ENCOUNTER — Encounter (HOSPITAL_COMMUNITY)
Admission: RE | Admit: 2017-12-28 | Discharge: 2017-12-28 | Disposition: A | Discharge status: Home / Self Care | Payer: Self-pay | Source: Ambulatory Visit | Attending: Interventional Cardiology | Admitting: Interventional Cardiology

## 2017-12-30 ENCOUNTER — Encounter (HOSPITAL_COMMUNITY)
Admission: RE | Admit: 2017-12-30 | Discharge: 2017-12-30 | Disposition: A | Discharge status: Home / Self Care | Payer: Self-pay | Source: Ambulatory Visit | Attending: Interventional Cardiology | Admitting: Interventional Cardiology

## 2018-01-02 ENCOUNTER — Encounter (HOSPITAL_COMMUNITY)
Admission: RE | Admit: 2018-01-02 | Discharge: 2018-01-02 | Disposition: A | Discharge status: Home / Self Care | Payer: Self-pay | Source: Ambulatory Visit | Attending: Interventional Cardiology | Admitting: Interventional Cardiology

## 2018-01-04 ENCOUNTER — Encounter (HOSPITAL_COMMUNITY)
Admission: RE | Admit: 2018-01-04 | Discharge: 2018-01-04 | Disposition: A | Discharge status: Home / Self Care | Payer: Self-pay | Source: Ambulatory Visit | Attending: Interventional Cardiology | Admitting: Interventional Cardiology

## 2018-01-05 ENCOUNTER — Encounter: Payer: Self-pay | Admitting: Interventional Cardiology

## 2018-01-06 ENCOUNTER — Encounter (HOSPITAL_COMMUNITY)
Admission: RE | Admit: 2018-01-06 | Discharge: 2018-01-06 | Disposition: A | Discharge status: Home / Self Care | Payer: BC Managed Care – PPO | Source: Ambulatory Visit | Attending: Interventional Cardiology | Admitting: Interventional Cardiology

## 2018-01-09 ENCOUNTER — Encounter (HOSPITAL_COMMUNITY): Payer: Self-pay

## 2018-01-11 ENCOUNTER — Encounter (HOSPITAL_COMMUNITY)
Admission: RE | Admit: 2018-01-11 | Discharge: 2018-01-11 | Disposition: A | Discharge status: Home / Self Care | Payer: Self-pay | Source: Ambulatory Visit | Attending: Interventional Cardiology | Admitting: Interventional Cardiology

## 2018-01-13 ENCOUNTER — Encounter (HOSPITAL_COMMUNITY): Payer: Self-pay

## 2018-01-13 DIAGNOSIS — Z955 Presence of coronary angioplasty implant and graft: Secondary | ICD-10-CM | POA: Insufficient documentation

## 2018-01-13 DIAGNOSIS — I2111 ST elevation (STEMI) myocardial infarction involving right coronary artery: Secondary | ICD-10-CM | POA: Insufficient documentation

## 2018-01-16 ENCOUNTER — Ambulatory Visit: Payer: BC Managed Care – PPO | Admitting: Interventional Cardiology

## 2018-01-16 ENCOUNTER — Encounter (HOSPITAL_COMMUNITY)
Admission: RE | Admit: 2018-01-16 | Discharge: 2018-01-16 | Disposition: A | Discharge status: Home / Self Care | Payer: BC Managed Care – PPO | Source: Ambulatory Visit | Attending: Interventional Cardiology | Admitting: Interventional Cardiology

## 2018-01-18 ENCOUNTER — Other Ambulatory Visit: Payer: Self-pay | Admitting: Cardiology

## 2018-01-18 ENCOUNTER — Encounter (HOSPITAL_COMMUNITY)
Admission: RE | Admit: 2018-01-18 | Discharge: 2018-01-18 | Disposition: A | Discharge status: Home / Self Care | Payer: Self-pay | Source: Ambulatory Visit | Attending: Interventional Cardiology | Admitting: Interventional Cardiology

## 2018-01-19 ENCOUNTER — Other Ambulatory Visit: Payer: Self-pay | Admitting: *Deleted

## 2018-01-19 ENCOUNTER — Telehealth: Payer: Self-pay | Admitting: Interventional Cardiology

## 2018-01-19 MED ORDER — DILTIAZEM HCL ER COATED BEADS 240 MG PO CP24: 240.0000 mg | ORAL_CAPSULE | Freq: Every day | ORAL | 1 refills | Status: DC

## 2018-01-19 NOTE — Telephone Encounter (Signed)
New Message    *STAT* If patient is at the pharmacy, call can be transferred to refill team.   1. Which medications need to be refilled? (please list name of each medication and dose if known) CARTIA XT 240 MG 24 hr capsule  2. Which pharmacy/location (including street and city if local pharmacy) is medication to be sent to?[facility]    3. Do they need a 30 day or 90 day supply? Jocelyn [name]

## 2018-01-19 NOTE — Telephone Encounter (Signed)
REFILL 

## 2018-01-20 ENCOUNTER — Encounter (HOSPITAL_COMMUNITY)
Admission: RE | Admit: 2018-01-20 | Discharge: 2018-01-20 | Disposition: A | Discharge status: Home / Self Care | Payer: Self-pay | Source: Ambulatory Visit | Attending: Interventional Cardiology | Admitting: Interventional Cardiology

## 2018-01-23 ENCOUNTER — Encounter (HOSPITAL_COMMUNITY): Payer: Self-pay

## 2018-01-25 ENCOUNTER — Encounter (HOSPITAL_COMMUNITY)
Admission: RE | Admit: 2018-01-25 | Discharge: 2018-01-25 | Disposition: A | Discharge status: Home / Self Care | Payer: Self-pay | Source: Ambulatory Visit | Attending: Interventional Cardiology | Admitting: Interventional Cardiology

## 2018-01-27 ENCOUNTER — Encounter (HOSPITAL_COMMUNITY)
Admission: RE | Admit: 2018-01-27 | Discharge: 2018-01-27 | Disposition: A | Discharge status: Home / Self Care | Payer: BC Managed Care – PPO | Source: Ambulatory Visit | Attending: Interventional Cardiology | Admitting: Interventional Cardiology

## 2018-01-30 ENCOUNTER — Encounter (HOSPITAL_COMMUNITY)
Admission: RE | Admit: 2018-01-30 | Discharge: 2018-01-30 | Disposition: A | Discharge status: Home / Self Care | Payer: BC Managed Care – PPO | Source: Ambulatory Visit | Attending: Interventional Cardiology | Admitting: Interventional Cardiology

## 2018-01-31 NOTE — Progress Notes (Signed)
Cardiology Office Note   Date:  02/02/2018   ID:  Jocelyn [name], DOB 08-30-1956, MRN [id number]  PCP:  Noralee Space, MD    No chief complaint on file.  CAD/Old MI  Wt Readings from Last 3 Encounters:  02/02/18 171 lb 12.8 oz (77.9 kg)  10/10/17 173 lb 11.6 oz (78.8 kg)  08/22/17 174 lb 8 oz (79.2 kg)       History of Present Illness: Jocelyn [name] is a 62 y.o. female  With CAD.  Cath several years ago Showed branch vessel disease. She has had difficulty tolerating statins. Eventually, she was put on Repatha.  She had an inferior STEMI on 06/15/2017. She received a drug-eluting stent to her distal right coronary artery. She had mild LAD disease. Her diagonal vessel disease has persisted over the years. Of note, she had severe radial spasm and it was noted that she had subclavian artery lusoria variant which made right radial approach quite difficult. This anatomic abnormality had been noted on a prior CT scan done at [facility].  She had arm pain, heartburn, back pain when she had her MI.    Echo in 8/18 showed: Left ventricle: The cavity size was normal. There was mild   concentric hypertrophy. Systolic function was normal. The   estimated ejection fraction was in the range of 50% to 55%. Wall   motion was normal; there were no regional wall motion   abnormalities. Doppler parameters are consistent with abnormal   left ventricular relaxation (grade 1 diastolic dysfunction). - Aortic valve: There was no regurgitation. - Mitral valve: There was mild regurgitation. - Right ventricle: The cavity size was normal. Wall thickness was   normal. Systolic function was normal. - Atrial septum: No defect or patent foramen ovale was identified   by color flow Doppler. - Tricuspid valve: There was no regurgitation.  She has had issues with HTN and headaches.  THis seems to have resolved.  If she rushes, systolic may be in the 485I, but is better by the time she leaves rehab when  it is rechecked.  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  Denies :  Dizziness. Leg edema. Orthopnea. Palpitations. Paroxysmal nocturnal dyspnea. Shortness of breath. Syncope.   She occasionally has chest ain, but this is different than her prior angina.    SHOB is chronic and persists.      Past Medical History:  Diagnosis Date  . Acute bronchitis   . Allergic rhinitis, cause unspecified   . Anemia   . Anxiety   . B12 deficiency   . BV (bacterial vaginosis) 06/22/1996  . Calculus of kidney   . Calculus of kidney   . Coronary atherosclerosis of unspecified type of vessel, native or graft   . Dizziness   . Essential hypertension, benign   . Fibroid 2003  . Fibromyalgia   . H/O dysmenorrhea 2008  . H/O varicella   . Headache(784.0)    frequently  . HSV-2 infection 2009  . Hyperplastic colon polyp 05/16/2014  . Irritable bowel syndrome   . Meniere's disease, unspecified   . Menses, irregular 2003  . Myalgia and myositis, unspecified   . Obstructive sleep apnea (adult) (pediatric)   . Perimenopausal symptoms 2003  . Pure hypercholesterolemia   . Sarcoidosis   . Type II or unspecified type diabetes mellitus without mention of complication, not stated as uncontrolled   . Unspecified venous (peripheral) insufficiency   . Vitamin D deficiency   .  Vulvitis 2010  . Yeast infection     Past Surgical History:  Procedure Laterality Date  . CORONARY STENT INTERVENTION N/A 06/15/2017   Procedure: CORONARY STENT INTERVENTION;  Surgeon: Jettie Booze, MD;  Location: [facility];  Service: Cardiovascular;  Laterality: N/A;  . heart catherization    . LEFT HEART CATH AND CORONARY ANGIOGRAPHY N/A 06/15/2017   Procedure: LEFT HEART CATH AND CORONARY ANGIOGRAPHY;  Surgeon: Jettie Booze, MD;  Location: [facility];  Service: Cardiovascular;  Laterality: N/A;  . TONSILLECTOMY       Current Outpatient Medications  Medication Sig Dispense Refill  . ALPRAZolam  (XANAX) 0.5 MG tablet Take 1 tablet by mouth daily at bedtime 30 tablet 5  . aspirin 81 MG chewable tablet Chew 1 tablet (81 mg total) by mouth daily.    . Cyanocobalamin 1000 MCG/ML KIT Inject 1,000 mg as directed every 30 (thirty) days.    [name] diltiazem (CARTIA XT) 240 MG 24 hr capsule Take 1 capsule (240 mg total) by mouth daily. 90 capsule 1  . Empagliflozin-Metformin HCl (SYNJARDY) 03-999 MG TABS Take 1 tablet by mouth daily.    . Evolocumab (REPATHA SURECLICK) 607 MG/ML SOAJ Inject 1 pen into the skin every 14 (fourteen) days. 2 pen 11  . fluticasone (FLONASE) 50 MCG/ACT nasal spray Place 2 sprays into both nostrils at bedtime. 16 g 2  . furosemide (LASIX) 20 MG tablet Take 20 mg by mouth daily as needed for fluid or edema.    [name] HP ACTHAR 80 UNIT/ML injectable gel Inject 80 Units into the skin 3 (three) times a week.     . losartan (COZAAR) 100 MG tablet take 1 tablet by mouth once daily 30 tablet 10  . metoprolol tartrate (LOPRESSOR) 50 MG tablet TAKE 1 TABLET BY MOUTH TWICE A DAY 60 tablet 2  . mometasone-formoterol (DULERA) 100-5 MCG/ACT AERO Inhale 2 puffs into the lungs 2 (two) times daily.    . nitroGLYCERIN (NITROSTAT) 0.4 MG SL tablet Place 1 tablet (0.4 mg total) under the tongue every 5 (five) minutes as needed for chest pain. 25 tablet 2  . ondansetron (ZOFRAN-ODT) 4 MG disintegrating tablet Take 4 mg by mouth every 8 (eight) hours as needed for nausea or vomiting.    Glory Rosebush VERIO test strip   1  . pantoprazole (PROTONIX) 40 MG tablet take 1 tablet by mouth once daily 90 tablet 3  . ticagrelor (BRILINTA) 90 MG TABS tablet Take 1 tablet (90 mg total) by mouth 2 (two) times daily. 180 tablet 3  . traMADol (ULTRAM) 50 MG tablet Take 1 tablet (50 mg total) by mouth every 6 (six) hours as needed for severe pain. 30 tablet 0  . valACYclovir (VALTREX) 500 MG tablet Take 500 mg by mouth daily.      No current facility-administered medications for this visit.     Allergies:   Actos  [pioglitazone]; Codeine; Januvia [sitagliptin]; Lactose intolerance (gi); and Morphine    Social History:  The patient  reports that she has never smoked. She has never used smokeless tobacco. She reports that she does not drink alcohol or use drugs.   Family History:  The patient's family history includes Other in her unknown relative. She was adopted.    ROS:  Please see the history of present illness.   Otherwise, review of systems are positive for easy bruising.   All other systems are reviewed and negative.    PHYSICAL EXAM: VS:  BP 128/88   Pulse 64   Ht 5' 2.5" (1.588 m)   Wt 171 lb 12.8 oz (77.9 kg)   SpO2 98%   BMI 30.92 kg/m  , BMI Body mass index is 30.92 kg/m. GEN: Well nourished, well developed, in no acute distress  HEENT: normal  Neck: no JVD, carotid bruits, or masses Cardiac: RRR; no murmurs, rubs, or gallops,no edema  Respiratory:  clear to auscultation bilaterally, normal work of breathing GI: soft, nontender, nondistended, + BS MS: no deformity or atrophy  Skin: warm and dry, no rash Neuro:  Strength and sensation are intact Psych: euthymic mood, full affect    Recent Labs: 06/16/2017: Hemoglobin 11.6; Platelets 235 07/08/2017: BUN 17; Creatinine, Ser 1.05; Potassium 4.0; Sodium 142   Lipid Panel    Component Value Date/Time   CHOL 182 06/15/2017 1205   TRIG 163 (H) 06/15/2017 1205   HDL 56 06/15/2017 1205   CHOLHDL 3.3 06/15/2017 1205   VLDL 33 06/15/2017 1205   LDLCALC 93 06/15/2017 1205   LDLDIRECT 91.6 08/14/2014 0822     Other studies Reviewed: Additional studies/ records that were reviewed today with results demonstrating: EF 50-55% by echo in 8/18.   ASSESSMENT AND PLAN:  1. CAD/Old MI: RCA stent in place.  Diagonal disease has been managed with medication for several years. 2. HTN: BP controlled. Continue current meds. 3. Hyperlipidemia: LDL 93 on Repatha.  4. DM: A1C 7.3 in Jan 2019.  Synjardy was increased.  Healthy diet.  COntinue  with regular exercise 5. Sleep study is pending for daytime somnolence. 6. SHOB: This is chronic.  We discussed switching Brilinta to Plavix to see if this helped.  She will stay on the [place].  Plan to change to plavix in 8/19.     Current medicines are reviewed at length with the patient today.  The patient concerns regarding her medicines were addressed.  The following changes have been made:  No change  Labs/ tests ordered today include:  No orders of the defined types were placed in this encounter.   Recommend 150 minutes/week of aerobic exercise Low fat, low carb, high fiber diet recommended  Disposition:   FU in 5-6 months   Signed, Larae Grooms, MD  02/02/2018 4:05 PM    [facility] [address], [place], [place]  [address] Phone: [phone]; Fax: [phone]

## 2018-02-01 ENCOUNTER — Encounter (HOSPITAL_COMMUNITY)
Admission: RE | Admit: 2018-02-01 | Discharge: 2018-02-01 | Disposition: A | Discharge status: Home / Self Care | Payer: Self-pay | Source: Ambulatory Visit | Attending: Interventional Cardiology | Admitting: Interventional Cardiology

## 2018-02-02 ENCOUNTER — Encounter: Payer: Self-pay | Admitting: Interventional Cardiology

## 2018-02-02 ENCOUNTER — Ambulatory Visit: Payer: BC Managed Care – PPO | Admitting: Interventional Cardiology

## 2018-02-02 VITALS — BP 128/88 | HR 64 | Ht 62.5 in | Wt 171.8 lb

## 2018-02-02 DIAGNOSIS — E119 Type 2 diabetes mellitus without complications: Secondary | ICD-10-CM

## 2018-02-02 DIAGNOSIS — I252 Old myocardial infarction: Secondary | ICD-10-CM

## 2018-02-02 DIAGNOSIS — R400 Somnolence: Secondary | ICD-10-CM

## 2018-02-02 DIAGNOSIS — I25118 Atherosclerotic heart disease of native coronary artery with other forms of angina pectoris: Secondary | ICD-10-CM | POA: Diagnosis not present

## 2018-02-02 DIAGNOSIS — E782 Mixed hyperlipidemia: Secondary | ICD-10-CM

## 2018-02-02 NOTE — Patient Instructions (Signed)
Medication Instructions:  Your physician recommends that you continue on your current medications as directed. Please refer to the Current Medication list given to you today.   Labwork: None ordered  Testing/Procedures: None ordered  Follow-Up: Your physician wants you to follow-up in: August with Dr. Irish Lack. You will receive a reminder letter in the mail two months in advance. If you don't receive a letter, please call our office to schedule the follow-up appointment.   Any Other Special Instructions Will Be Listed Below (If Applicable).     If you need a refill on your cardiac medications before your next appointment, please call your pharmacy.

## 2018-02-03 ENCOUNTER — Encounter (HOSPITAL_COMMUNITY)
Admission: RE | Admit: 2018-02-03 | Discharge: 2018-02-03 | Disposition: A | Discharge status: Home / Self Care | Payer: Self-pay | Source: Ambulatory Visit | Attending: Interventional Cardiology | Admitting: Interventional Cardiology

## 2018-02-06 ENCOUNTER — Encounter (HOSPITAL_COMMUNITY)
Admission: RE | Admit: 2018-02-06 | Discharge: 2018-02-06 | Disposition: A | Discharge status: Home / Self Care | Payer: Self-pay | Source: Ambulatory Visit | Attending: Interventional Cardiology | Admitting: Interventional Cardiology

## 2018-02-08 ENCOUNTER — Encounter (HOSPITAL_COMMUNITY)
Admission: RE | Admit: 2018-02-08 | Discharge: 2018-02-08 | Disposition: A | Discharge status: Home / Self Care | Payer: Self-pay | Source: Ambulatory Visit | Attending: Interventional Cardiology | Admitting: Interventional Cardiology

## 2018-02-10 ENCOUNTER — Encounter (HOSPITAL_COMMUNITY)
Admission: RE | Admit: 2018-02-10 | Discharge: 2018-02-10 | Disposition: A | Discharge status: Home / Self Care | Payer: BC Managed Care – PPO | Source: Ambulatory Visit | Attending: Interventional Cardiology | Admitting: Interventional Cardiology

## 2018-02-13 ENCOUNTER — Encounter (HOSPITAL_COMMUNITY)
Admission: RE | Admit: 2018-02-13 | Discharge: 2018-02-13 | Disposition: A | Discharge status: Home / Self Care | Payer: Self-pay | Source: Ambulatory Visit | Attending: Interventional Cardiology | Admitting: Interventional Cardiology

## 2018-02-13 DIAGNOSIS — Z955 Presence of coronary angioplasty implant and graft: Secondary | ICD-10-CM | POA: Insufficient documentation

## 2018-02-13 DIAGNOSIS — I2111 ST elevation (STEMI) myocardial infarction involving right coronary artery: Secondary | ICD-10-CM | POA: Insufficient documentation

## 2018-02-15 ENCOUNTER — Encounter (HOSPITAL_COMMUNITY)
Admission: RE | Admit: 2018-02-15 | Discharge: 2018-02-15 | Disposition: A | Discharge status: Home / Self Care | Payer: BC Managed Care – PPO | Source: Ambulatory Visit | Attending: Interventional Cardiology | Admitting: Interventional Cardiology

## 2018-02-17 ENCOUNTER — Encounter (HOSPITAL_COMMUNITY)
Admission: RE | Admit: 2018-02-17 | Discharge: 2018-02-17 | Disposition: A | Discharge status: Home / Self Care | Payer: BC Managed Care – PPO | Source: Ambulatory Visit | Attending: Interventional Cardiology | Admitting: Interventional Cardiology

## 2018-02-20 ENCOUNTER — Ambulatory Visit: Payer: BC Managed Care – PPO | Admitting: Pulmonary Disease

## 2018-02-20 ENCOUNTER — Encounter: Payer: Self-pay | Admitting: Pulmonary Disease

## 2018-02-20 ENCOUNTER — Encounter (HOSPITAL_COMMUNITY): Payer: Self-pay

## 2018-02-20 VITALS — BP 142/70 | HR 80 | Temp 98.2°F | Ht 62.5 in | Wt 172.8 lb

## 2018-02-20 DIAGNOSIS — I872 Venous insufficiency (chronic) (peripheral): Secondary | ICD-10-CM

## 2018-02-20 DIAGNOSIS — Z862 Personal history of diseases of the blood and blood-forming organs and certain disorders involving the immune mechanism: Secondary | ICD-10-CM | POA: Diagnosis not present

## 2018-02-20 DIAGNOSIS — M3320 Polymyositis, organ involvement unspecified: Secondary | ICD-10-CM | POA: Diagnosis not present

## 2018-02-20 DIAGNOSIS — R609 Edema, unspecified: Secondary | ICD-10-CM | POA: Diagnosis not present

## 2018-02-20 DIAGNOSIS — Z955 Presence of coronary angioplasty implant and graft: Secondary | ICD-10-CM | POA: Diagnosis not present

## 2018-02-20 DIAGNOSIS — E538 Deficiency of other specified B group vitamins: Secondary | ICD-10-CM

## 2018-02-20 DIAGNOSIS — E119 Type 2 diabetes mellitus without complications: Secondary | ICD-10-CM | POA: Diagnosis not present

## 2018-02-20 DIAGNOSIS — E663 Overweight: Secondary | ICD-10-CM | POA: Diagnosis not present

## 2018-02-20 DIAGNOSIS — J449 Chronic obstructive pulmonary disease, unspecified: Secondary | ICD-10-CM

## 2018-02-20 DIAGNOSIS — I10 Essential (primary) hypertension: Secondary | ICD-10-CM

## 2018-02-20 DIAGNOSIS — I2111 ST elevation (STEMI) myocardial infarction involving right coronary artery: Secondary | ICD-10-CM | POA: Diagnosis not present

## 2018-02-20 DIAGNOSIS — I2510 Atherosclerotic heart disease of native coronary artery without angina pectoris: Secondary | ICD-10-CM | POA: Diagnosis not present

## 2018-02-20 DIAGNOSIS — E7800 Pure hypercholesterolemia, unspecified: Secondary | ICD-10-CM

## 2018-02-20 DIAGNOSIS — R531 Weakness: Secondary | ICD-10-CM

## 2018-02-20 DIAGNOSIS — E559 Vitamin D deficiency, unspecified: Secondary | ICD-10-CM

## 2018-02-20 MED ORDER — ALBUTEROL SULFATE HFA 108 (90 BASE) MCG/ACT IN AERS: 1.0000 | INHALATION_SPRAY | Freq: Four times a day (QID) | RESPIRATORY_TRACT | 3 refills | Status: DC | PRN

## 2018-02-20 MED ORDER — TRAMADOL HCL 50 MG PO TABS: 50.0000 mg | ORAL_TABLET | Freq: Three times a day (TID) | ORAL | 0 refills | Status: DC | PRN

## 2018-02-20 MED ORDER — ALPRAZOLAM 0.5 MG PO TABS: ORAL_TABLET | ORAL | 0 refills | Status: DC

## 2018-02-20 MED ORDER — FLUTICASONE-SALMETEROL 232-14 MCG/ACT IN AEPB: 2.0000 | INHALATION_SPRAY | Freq: Two times a day (BID) | RESPIRATORY_TRACT | 5 refills | Status: DC

## 2018-02-20 NOTE — Patient Instructions (Signed)
Today we updated your med list in our EPIC system...     We decided to adjust the following meds & provided refills >>  NEW - stop the [facility] & start the new AIRDUO 232-14 using 2 sprays twice daily...  We refilled your RESCUE inhaler = PROAIR-HFA use 1-2 sprays every 6H as needed for wheezing...  We refilled your TRAMADOL 50mg  for pain - take one tab up to 3 times daily as needed for pain (remember to take this with Tylenol to boost its effect)...  We refilled your XANAX (Alprazolam) 0.5mg  tabs taking 1/2 tab in AM, 1/2 tab in the afternoon, & one tab at bedtime...  Remember to call your prescription drug provider for a copy of their DRUG FORMULARY...  Also don't forget to check out [place] on your computer...  Call for any questions...  Let's plan a follow up visit in 3-41mo sooner if needed for acute problems.Marland KitchenMarland Kitchen

## 2018-02-20 NOTE — Progress Notes (Signed)
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 today's office visit>      AR/ AB/ Hx Sarcoid>  On Dulera100-2spBid (but ran out- too $$)& Albut rescue inhaler prn & Flonase; we discussed Rx w/ MUCINEX 600mg - 1-2Bid w/ fluids...     HBP/ CAD- s/p IWMI 8/18 w/ cath thrombectomy & drug eluting stent in RCA>  Now on ASA81, Brillinta90Bid, Metop50Bid, CardizemCD240, Losar100, Lasix20prn;  BP= 126/74, no recurrent CP, no palpit, syncope, edema; followed by [name] & last seen 07/08/17- post hosp check & no change in meds, rec to continue aggressive secondary prevention...    VI & chr stasis changes> on low salt diet, Lasix20 prn, compression hose; she was seen in the [facility] wound clinic yrs ago by [employer] & DrSevier...    Chol>  Followed in the [facility]- [name]; hx statin intol, prev in the [facility] lipid lowering study in 2015, CPK elev & she was taken off the drug, she's also had myalgias from Zetia; currently on REPATHA shots Q14d;  LAB f/u FLP on Repatha 08/18/16 showed TChol 191, TG 214, HDL 41, LDL 107...    DM, Obesity>  Followed by [name], note 07/21/17 reviewed, switched to [facility] 11-998 one daily;  A1c= 6.8    GI- GERD, IBS>  On Protonix40, Zofran4 prn; prev followed by [name]- she had EGD & Colon 7/15; presented c/o ext hem- Rx w/ sitz, baby wipes, benefiberAnusolHC...    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 We will call for updated notes from  him... She is also c/o LBP & wants TRAMADOL refilled...     Anxiety>  On Xanax 0,5mg  1/2-1 tab Tid...    VitB12 & VitD defic>  On B12 IM Qmo per [name] &  B12 level done 8/14 = 154 w/ f/u B12 level >1500 on the shots;  she tells me that [name] treated her low VitD w/ 50K weekly, then switched to daily OTC supplement, we do not have notes from [name]...    Borderline anemia & low Ferritin>  she was treated w/ Fe from [name] & improved... EXAM shows Afeb, VSS, O2sat=99% on RA; Wt=173#, 5'2"Tall, BMI=32;  Heent- neg, mallampati2; Chest- decr BS & few bibasilar rhonchi w/o w/r; Heart- RR gr1/6 SEM w/o r/g; Abd- soft, neg; Ext- VI, tr edema; Neuro- mild wkness. IMP/PLAN>>  We refilled XANAX 0.5mg - take 1/2 tab bid & 1Qhs #60/mo;  We refilled PROAIR-HFA rescue inhaler, and rec trial AIRDUO 232-14 2spBid ($50 cash price from [employer]);  We also refilled her TRAMADOL per request;  She will maintain f/u w/ DrBeekman, [name], [name], and her Gyn; she will seek a new PCP as I am going to retire later this yr...          Problems List:  MENIERE'S DISEASE (ICD-3 - eval by ENT, DrBates- Rx w/ diuretic, low salt, Xanax vs Valium, & Antivert Prn... dizziness recurred & ENT sent her to [facility] 7/11- we don't have notes but pt reports that nothing was found.  ALLERGIC RHINITIS - increased allergy symptoms in the spring... rec> Claritin, Saline, Flonase...  Hx of ASTHMATIC BRONCHITIS, ACUTE  - requires occas Antibiotics and Medrol for infectious exac- last Oct09 & resolved w/ Avelox/ Depo/ Medrol... has not been on regular inhalers, but uses XOPENEX MDI (w/ spacer), MUCINEX & TUSSIONEX Prn... ~  essentially neg CTChest 9/08 after CXR suggested RULnodule. ~  CXR 10/09 showed cardiomegaly, clear lungs, min scoliosis, NAD. ~  CXR 9/11 showed cardiomeg, clear lungs, NAD.[name]. ~  CXR 1/14 showed cardiomeg, clear lungs, no adenopathy, mild scoliosis... ~  CXR 4/15 showed mild cardiomeg, clear lungs, NAD... ~  PFT  10/15 showed FVC=1.93 (79%), FEV1=1.55 (79%), %1sec=80%, mid-flows=70% predicted...  ~  CXR showed mild cardiomeg & sl tortuous Ao, clear lungs, DDD of Tspine, NAD... ~  10/16:  We wrote for an AlbutHFA inhaler for prn use at her request for wheezing... ~  12/16: presents w/ refractory AB- given Levaquin, already on Pred per Rheum, added Advair100Bid + Mucinex600-2Bid... ~  02/02/16: another bout of refractory AB- treated w/ Depo, Medrol taper, Augmentin, Mucinex, Proventil...  Hx of SARCOIDOSIS  - Dx'd 1980's w/ bronch bx... Rx Pred transiently and improved... no active disease x years... ~  baseline CXRs showed cardiomegaly, clear lungs, min scoliosis, NAD.  Hx of Mild OBSTRUCTIVE SLEEP APNEA - sleep eval DrClance 2006 w/ RDI= 5 only.   FOLLOWED by [facility] FOR CARDIOLOGY >>   HYPERTENSION, BENIGN  >> followeed by [name] now for CARDS...  ~  on TOPROL XL 50mg - 1.5 tabs daily, CARDIZEM 240mg /d, LASIX 20mg  just as needed now... ~  prev OV's 130-140's / 80's... prev noted sl HA, chest tightness, sl SOB, & mult somatic complaints... Labs & renal- all WNL. ~  4/12:  BP=122/76 today, and similar at home per pt... She denies CP, palpit, SOB, edema, etc... Labs & renal- all WNL. ~  1/14: on ASA81, Imdur120, ToprolXL75, Cardizem240, Lasix20prn (seldom takes); BP=128/80 & she has mult somatic c/o- CWP, tired/no energy, but denies palpit, ch in SOB, edema, etc; followed by [facility]- DrTurner/ [name] & seen recently but we don't have note; they prev added RANEXA 500mg  Bid for her CP (now off this med); we discussed trial Lunesta 2mg Qhs for sleep & Tramadol 50mg Tid for pain... ~  9/14:  on ASA81, Imdur120, Toprol50-1/2Bid, Cardizem240, Lasix20prn (seldom takes); BP=132/74 & she has mult somatic c/o- CWP, tired/no energy, but denies palpit, ch in SOB, edema, etc; followed by [facility] cards- DrTurner/ [name], no recent notes ~  4/15: on ASA81, Toprol50-1/2Bid, Cardizem240, Lasix20prn (ave 1/d) &  ?Amlodip5 from [employer]?; BP=124/76 & she has mult somatic c/o- CWP, tired/no energy, but denies  palpit, ch in SOB, edema, etc; followed by [facility] cards- [name], no recent notes. ~  4/16: on ASA81, Metop25Bid, CardizemCD240, Losar100, Lasix40; BP= 146/88 and reminded to elim sodium & get weight down...  CAD- s/p IWMI 8/18 w/ thrombectomy/stent by [name] - prev followed by DrTTurner... ~  Cath in 2000 showed non-obstructive CAD (20-30% lesions in all 3 vessels) w/ rec for risk factor modification.   ~  NuclearStressTest 4/07 showed no ischemia & EF=73%. ~  Mar10:  she's concerned about BP and chest tightness, requests Cardiac eval DrTurner & we will refer... ~  2DEcho 4/10 showed norm LVF w/ EF=55-60%, norm MV, norm AoV, Gr1DD ~  4/10 eval by DrTurner w/ MYOVIEW- neg- breast attenuation, no regional wall motion abn, EF= 75% ~  4/10 Cath by DrTurner w/ 90% small 2nd diag branch of LAD <too sm for PTCA> & luminal irreg up to 20% in RCA, +DD- Med Rx. ~  She had more chest tightness & another Myoview from DrTurner 8/12- it too was neg, no ischemia, no infarct; +breast attenuation; EF=70% & nno regional wall motion abnormalities;  IMDUR has been incr to 120mg /d. ~  1/14: she tells me [facility] but we don't have notes from him> EKG sent showed NSR, rate72, wnl, NAD... ~  EKG 5/14 showed NSR, rate 93, WNL, NAD... ~  12/14: she saw [name] for Cards f/u (note reviewed)> HBP, CAD, Chol; off Imdur w/o angina, continue same meds including both Cardizem & Amlodipine, encouraged to continue [employer] study... ~  She continues regular f/u visits w/ [name], CARDS> his notes are reviewed... ~  08/22/17>  Now on ASA81, Brillinta90Bid, Metop50Bid, CardizemCD240, Losar100, Lasix20prn + Repatha; followed by [facility] & [name]  VENOUS INSUFFICIENCY - she was referred to the [facility] foot clinic by DrWWoods and seen by DrSevier 6/08= ven insuffic Rx w/ TED's, no salt, elevation, etc. ~   10/10: notes bilat ankle ?lipoma in front of the lat malleoli- asymptomatic but several people have noticed them and she request refer to Ortho (rec DrBednarz when she is ready).  HYPERCHOLESTEROLEMIA  - on LIPITOR 40mg /d & ZETIA 10mg /d + CoQ10 per DrTurner... Diet & exercise stressed to the pt. ~  FLP here 12/07 on Lip10 showed TChol 156, TG 94, HDL 51, LDL 86 ~  FLP 1/09 on Lip10 showed TChol 157, TG 122, HDL 49, LDL 84 ~  FLP at [facility] study 5/09 on Lip10 showed TChol 176, TG 116, HDL 55, LDL 98 ~  FLP 3/10 on Lip10 showed TChol 170, TG 94, HDL 59, LDL 93 ~  FLP 6/10 by DrTurner on Lip20 showed TChol 134, TG 82, HDL 55, LDL 73 ~  FLP 4/11 here on Lip20+Zetia? showed TChol 145, TG 74, HDL 68, LDL 63 ~  9/11:  she reports that DrTurner incr Lipitor to 40mg , plus the Zetia 10mg , and she does her FLPs. ~  FLP here 4/12 on Lip40+Zetia showed TChol 153, TG 54, HDL 53, LDL 90... Copy sent to DrTurner ~  FLP by [name] 7/13 on Lip40+Zetia10 showed TChol 137, TG 89, HDL 42, LDL 73 ~  9/14: on Lip40 => notes from [place] says ?Lip80 +[facility] study drug?; she has gained 4# to 190# today; FLP is followed by [facility] per pt & she reports concern for "particle size" & last avail FLP was 7/14 by [name] w/ TChol 190, HDL 100, LDL 40 ~  FLP followed by [name] for CARDS, and [name] for Endocrine... ~  Now on REPATHA  shots Q14d...   FOLLOWED by [name] FOR ENDOCRINOLOGY >>   DIABETES MELLITUS  - on Metformin 500mg Bid + PARLODEL 2.5mg /d per DrEllison... She is intol to Actos w/ edema, she stopped Januvia due to ?side effect, she wondered about Tajenta since her daugh works for [employer]. ~  labs 9/08 showed FBS=119, HgA1c=6.6 ~  labs 5/09 at [facility] study showed BS= 108, HgA1c= 6.5 ~  labs 3/10 showed BS= 113, A1c= 6.6 ~  labs 6/10 by DrTurner showed BS= 92; and 3/11 BS= 127 ~  labs 4/11 showed BS= 124, A1c= 7.6.[name]. rec> incr Metform to Bid; she had nutrition counseling at [facility]... ~  6/11:  pt requested  Endocrine consult & seen by DrEllison w/ Januvia 100mg /d added. ~  labs 9/11 showed BS= 98, A1c= 7.3.Marland KitchenMarland Kitchen she stopped Januvia due to side effects & wonders about using Tajenta instead. ~  DrEllison started Bromocyptine 2.5mg /d... She is now taking this +Metform 500mg Bid... ~  Labs 4/12 (wt=195#) showed Bs= 116, A1c= 7.1 ~  Labs 7/12 showed A1c= 7.2;  Umicroalb= 3.5 ~  Labs 10/12 (wt=186#) showed A1c= 6.7 NOTE: She participated in a [facility] trial called the AfricanAmerican- Diabetes Heart Study in 2012; they sent a report indicating: 1) her memory was fine, but her MRI Brain showed sm vessel dis & mild sinus dis, otherw neg; 2) she may have treatable depression but we tried her on Zoloft & she was intol==> ch to Lexapro. ~  followed by [name] on Metform500Bid & Onglyza2.5; labs from [name] 9/13 showed BS=201, A1c=6.0; he rec same meds... ~  9/14: followed by [name] on Metform500Bid & Onglyza2.5 => ?he changed to Kombiglyze5-1000 daily?; last labs from [name] 7/14 showed BS=105, A1c=6.5; and she remains on the same meds... ~  1/15: she had f/u [name] for Endocrine> DM, Obesity, Adrenal adenoma, VitB12 defic; labs reviewed, note reviewed- he tried Belviq but she was intol... ~  She continues regular follow up w/ [name]... ~  Switched to [facility] 08-999 Qam after her IWMI 8/18... A1c=6.8   FOLLOWED by [name] et al for GASTORENTEROLOGY >>   GERD SYMPTOMS - pt placed on PROTONIX 40mg /d w/ improvement in reflux symptoms...    ADDENDUM>> note from [name] indicates> upper endoscopy in July 1994 showed antral gastritis. 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  IRRITABLE BOWEL SYNDROME  - colonoscopy 7/03 by [name] was WNL. ~  9/14: she was referred to GI for f/u colon but never did it... ~  4/15: we will refer her to GI again...  RENAL CALCULUS  - sm stone seen in L kidney on CTScan... she had lithotripsy by [facility] ~9/09.  POLYMYOSITIS >> now  followed by DrBeekman FIBROMYALGIA  - prev Rx by [name] in 2006... ~  1/14: This is her CC w/ not resting well, wakes tired, poor energy, aching/ sore/ tender (esp in chest in AM) & we discussed trial Lunesta 2mg Qhs, & Tramadol 50mg Tid; plus rest, heat, etc... States she's INTOL Ambien w/ weird dreams & Benedryl/ Melatonin w/o help... ~  11/14: she went to the ER w/ LBP> felt to be a lumbar strain... ~  She was seen by [name] for Rheum [id number]...  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 supplemented w/ OTC Vit D 1000 u daily. ~  Vit D level was lower from [name] & he treated w/ Vit D 50K weekly for awhile, then switched to OTC supplement...  ANXIETY - on ALPRAZOLAM 0.5mg  Prn... DEPRESSION - prev on Lexapro 20mg /d but she stopped on her own ~10/13 ?cost issues? ~  4/12: c/o feeling sad & anxious all the time- weak, not resting well, under a lot of stress; rec to seek counselling thru her local church or let us refer to [facility], & start RX w/ Zoloft 50==> but she was INTOL... ~  2013: switched to Lexapro & seems to be doing better but didn't stick w/ it due to cost issues...  ANEMIA & LOW FERRITIN LEVEL >>  B12 DEFICIENCY >> treated by [name] w/ B12 shots ~  9/14: she tells me that Ferritin is low & [name] tried her on Oral Fe prep w/o improvement & wants me to f/u and treat this problem; we discussed need to recheck  labs here- LABS 9/14 showed Hg=12.5, MCV=88, Fe=47 (12%sat), Ferritin=10.8; she is due for f/u colon w/ [name] & we will refer, rec to start FeSO4 325mg  Bid w/ VitC500 w/ plans for f/u CBC, Fe studies in 2-62mo; ~  4/15:  Labs showed Hg= 12.5 but Fe=31 (8%); she was only taking the Fe once daily 7 advised to incr to Bid w/ vitC500; needs f/u colon & we will refer to [name] again...  HEALTH MAINTENANCE: ~  GI:  Followed by [name] & colon due 7/13... ~  GYN:  Followed by DrDillard & she gets yearly PAP, Mammogram, hasn't had baseline BMD yet, started on [facility] 5/13... ~  Immunizations:  She gets the yearly Flu shots at school;  Had PNEUMOVAX previously; TDAP given 4/12...   Past Surgical History:  Procedure Laterality Date  . CORONARY STENT INTERVENTION N/A 06/15/2017   Procedure: CORONARY STENT INTERVENTION;  Surgeon: Corky Crafts, MD;  Location: [facility] INVASIVE CV LAB;  Service: Cardiovascular;  Laterality: N/A;  . heart catherization    . LEFT HEART CATH AND CORONARY ANGIOGRAPHY N/A 06/15/2017   Procedure: LEFT HEART CATH AND CORONARY ANGIOGRAPHY;  Surgeon: Corky Crafts, MD;  Location: [facility] INVASIVE CV LAB;  Service: Cardiovascular;  Laterality: N/A;  . TONSILLECTOMY    No other past surgical history on file - other than Lithotripsy for renal stone...   Outpatient Encounter Medications as of 02/20/2018  Medication Sig  . ALPRAZolam (XANAX) 0.5 MG tablet Take 1 tablet by mouth daily at bedtime (Patient taking differently: Take 1/2 tab by mouth in mid morning and mid afternoon and then take 1 tab once daily at bedtime if needed)  . aspirin 81 MG chewable tablet Chew 1 tablet (81 mg total) by mouth daily.  . Cyanocobalamin 1000 MCG/ML KIT Inject 1,000 mg as directed every 30 (thirty) days.  [name] diltiazem (CARTIA XT) 240 MG 24 hr capsule Take 1 capsule (240 mg total) by mouth daily.  . Empagliflozin-metFORMIN HCl ER (SYNJARDY XR) 25-1000 MG TB24 Take 1 tablet by mouth daily.  .  Evolocumab (REPATHA SURECLICK) 140 MG/ML SOAJ Inject 1 pen into the skin every  14 (fourteen) days.  . furosemide (LASIX) 20 MG tablet Take 20 mg by mouth daily as needed for fluid or edema.  [name] HP ACTHAR 80 UNIT/ML injectable gel Inject 80 Units into the skin 3 (three) times a week.   . losartan (COZAAR) 100 MG tablet take 1 tablet by mouth once daily  . metoprolol tartrate (LOPRESSOR) 50 MG tablet TAKE 1 TABLET BY MOUTH TWICE A DAY  . nitroGLYCERIN (NITROSTAT) 0.4 MG SL tablet Place 1 tablet (0.4 mg total) under the tongue every 5 (five) minutes as needed for chest pain.  [name] ondansetron (ZOFRAN-ODT) 4 MG disintegrating tablet Take 4 mg by mouth every 8 (eight) hours as needed for nausea or vomiting.  Letta Pate VERIO test strip   . pantoprazole (PROTONIX) 40 MG tablet take 1 tablet by mouth once daily  . ticagrelor (BRILINTA) 90 MG TABS tablet Take 1 tablet (90 mg total) by mouth 2 (two) times daily.  . valACYclovir (VALTREX) 500 MG tablet Take 500 mg by mouth daily.   . [DISCONTINUED] Empagliflozin-Metformin HCl (SYNJARDY) 03-999 MG TABS Take 1 tablet by mouth daily.  . [DISCONTINUED] Empagliflozin-metFORMIN HCl ER (SYNJARDY XR) 25-1000 MG TB24 Take 1 tablet by mouth daily.  . [DISCONTINUED] traMADol (ULTRAM) 50 MG tablet Take 1 tablet (50 mg total) by mouth every 6 (six) hours as needed for severe pain.  [name] albuterol (PROAIR HFA) 108 (90 Base) MCG/ACT inhaler Inhale 1-2 puffs into the lungs every 6 (six) hours as needed for wheezing or shortness of breath.  . ALPRAZolam (XANAX) 0.5 MG tablet 1/2 tab in morning,1/2 tab in afternoon, 1/2 tab at bedtime  . fluticasone (FLONASE) 50 MCG/ACT nasal spray Place 2 sprays into both nostrils at bedtime. (Patient not taking: Reported on 02/20/2018)  . Fluticasone-Salmeterol (AIRDUO RESPICLICK 232/14) 232-14 MCG/ACT AEPB Inhale 2 puffs into the lungs 2 (two) times daily.  . mometasone-formoterol (DULERA) 100-5 MCG/ACT AERO Inhale 2 puffs into the lungs 2 (two)  times daily.  . traMADol (ULTRAM) 50 MG tablet Take 1 tablet (50 mg total) by mouth 3 (three) times daily as needed.   No facility-administered encounter medications on file as of 02/20/2018.     Allergies  Allergen Reactions  . Actos [Pioglitazone] Other (See Comments)    REACTION: pt states INTOL w/ edema  . Codeine Other (See Comments)    REACTION: vomiting  . Januvia [Sitagliptin] Nausea Only  . Lactose Intolerance (Gi) Diarrhea  . Morphine Nausea Only and Nausea And Vomiting    REACTION: vomiting REACTION: vomiting    Immunization History  Administered Date(s) Administered  . Influenza Split 08/16/2011, 08/16/2012, 08/16/2013, 09/04/2014  . Influenza Whole 09/16/2009, 08/15/2010  . Influenza,inj,Quad PF,6+ Mos 08/30/2016, 06/16/2017  . Influenza-Unspecified 08/19/2015  . Pneumococcal Polysaccharide-23 11/15/2004  . Tdap 02/22/2011    Current Medications, Allergies, Past Medical History, Past Surgical History, Family History, and Social History were reviewed in [employer] record.   Review of Systems        See HPI - all other systems neg except as noted...      The patient complains of weight gain, dyspnea on exertion, and peripheral edema.  The patient denies anorexia, fever, weight loss, vision loss, decreased hearing, hoarseness, chest pain, syncope, prolonged cough, headaches, hemoptysis, abdominal pain, melena, hematochezia, severe indigestion/heartburn, hematuria, incontinence, genital sores, suspicious skin lesions, transient blindness, difficulty walking, depression, unusual weight change, abnormal bleeding, enlarged lymph nodes, and angioedema.     Objective:   Physical Exam  WD, Overweight, 61 y/o BF in NAD... GENERAL:  Alert & oriented; pleasant & cooperative. HEENT:  [place]/AT, EOM-wnl, PERRLA, EACs-clear, TMs-wnl, NOSE-clear, THROAT-clear & wnl. NECK:  Supple w/ fairROM; no JVD; normal carotid impulses w/o bruits; no thyromegaly or  nodules palpated; no lymphadenopathy. CHEST:  decrBS at bases, few scar rhonchi & end-exp wheezing, no rales, no consolidation... HEART:  Regular Rhythm; without murmurs/ rubs/ or gallops. ABDOMEN:  Soft & nontender; normal bowel sounds; no organomegaly or masses detected. EXT: without deformities, mild arthritic changes; no varicose veins but +venous insuffic & tr edema; +trigger points. NEURO:  CN's intact;  no focal neuro deficits... DERM:  No lesions noted; no rash etc...  RADIOLOGY DATA:  Reviewed in the EPIC EMR & discussed w/ the patient...  LABORATORY DATA:  Reviewed in the EPIC EMR & discussed w/ the patient...   Assessment & Plan:    RESP>  Hx AR, HxAsthma, old sarcoid, mild OSA>  All stable, breathing OK, notes some wheezing w/ activity; exam reveals some secretions in the airway & rec to take MUCINEX 1200mg  bid, Fluids, etc;. CXR remains stable/ NAD... 10/20/15>  Presented w/ refractory AB- already on Pred- given Depo80, adding Levaquin500 x7d, ADVAIR100Bid, Mucinex600-2Bid + fluids... 02/02/16>  Presents w/ another refractory AB- currently off Pred & MTX, on Imuran; treated w/ Depo, Medrol taper, Augmentin, Mucinex, Albut=HFA...  04/2016>  Back to baseline 11/04/16>  She has URI & we wrote for ZPak => subseq Augmentin, Saline, flonase, Mucinex... 05/05/17>   Jocelyn [name] is improved s/p Augmentin & Pred Rx for her URI & AB exac;  She remains on Symbicort80-2spBid + Mucinex600- 1to2Bid w/ extra fluids;  I reminded her to be sure to requests notes sent to [name] from [facility] [name] as they are not on Epic;  She will maintain f/u w/ these specialists + [name] for CARDS;  We plan recheck in 3-24mo 02/20/18>   We refilled XANAX 0.5mg - take 1/2 tab bid & 1Qhs #60/mo;  We refilled PROAIR-HFA rescue inhaler, and rec trial AIRDUO 232-14 2spBid ($50 cash price from [employer]);  We also refilled her TRAMADOL per request;  She will maintain f/u w/ DrBeekman, [name], [name], and her Gyn; she will seek  a new PCP as I am going to retire later this yr.   CARDS>  Followed by DrTTurner/ Leron Croak ?on meds listed? SHE DID NOT BRING MEDS TO THE OV> Hx HBP, CAD, VI, etc;  Chest pain is felt to be non-cardiac & likely related to her FM; Doing satis & we reviewed exercise program etc...  CHOL>  Managed by [facility] for Cards- intol Lip + Zetia; she took part in a study drug from [facility] => now on PRALUENT thru [facility]id Clinic...  DM>  Followed by [name] on Metformin, Onglyzal; A1c improved to 6.7 in 2012, then 6.0 in 2013, & 6.5  In 2014;  rec continue meds & asked to have records sent to [name]...  GI>  Stable on Protonix as needed; see GI eval from [name] 7/15...  POLYMYOSITIS/ FM>  Diagnoses w/ Polymyositis recently (2016) & started on Pred, MTX, folate by DrBeekman=> switched to [employer] but insurance doesn't want to pay, she is reminded to have records sent to [name]...  Anxiety>  She remains on Alprazolam as needed; & she stopped the Lexapro..  Anemia, Low Ferritin> on FeSO4 325mg  & VitC 500...   B12 Defic> on B12 shots per [name]...  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   Patient's Medications  New Prescriptions   ALBUTEROL (PROAIR HFA) 108 (90 BASE) MCG/ACT INHALER    Inhale 1-2 puffs into the lungs every 6 (six) hours as needed for wheezing or shortness of breath.   ALPRAZOLAM (XANAX) 0.5 MG TABLET    1/2 tab in morning,1/2 tab in afternoon, 1/2 tab at bedtime   FLUTICASONE-SALMETEROL (AIRDUO RESPICLICK 232/14) 232-14 MCG/ACT AEPB    Inhale 2 puffs into the lungs 2 (two) times daily.   TRAMADOL (ULTRAM) 50 MG TABLET    Take 1 tablet (50 mg total) by mouth 3 (three) times daily as needed.  Previous  Medications   ALPRAZOLAM (XANAX) 0.5 MG TABLET    Take 1 tablet by mouth daily at bedtime   ASPIRIN 81 MG CHEWABLE TABLET    Chew 1 tablet (81 mg total) by mouth daily.   CYANOCOBALAMIN 1000 MCG/ML KIT    Inject 1,000 mg as directed every 30 (thirty) days.   DILTIAZEM (CARTIA XT) 240 MG 24 HR CAPSULE    Take 1 capsule (240 mg total) by mouth daily.   EMPAGLIFLOZIN-METFORMIN HCL ER (SYNJARDY XR) 25-1000 MG TB24    Take 1 tablet by mouth daily.   EVOLOCUMAB (REPATHA SURECLICK) 140 MG/ML SOAJ    Inject 1 pen into the skin every 14 (fourteen) days.   FLUTICASONE (FLONASE) 50 MCG/ACT NASAL SPRAY    Place 2 sprays into both nostrils at bedtime.   FUROSEMIDE (LASIX) 20 MG TABLET    Take 20 mg by mouth daily as needed for fluid or edema.   HP ACTHAR 80 UNIT/ML INJECTABLE GEL    Inject 80 Units into the skin 2-3 (three) times a week.    LOSARTAN (COZAAR) 100 MG TABLET    take 1 tablet by mouth once daily   METOPROLOL TARTRATE (LOPRESSOR) 50 MG TABLET    TAKE 1 TABLET BY MOUTH TWICE A DAY   NITROGLYCERIN (NITROSTAT) 0.4 MG SL TABLET    Place 1 tablet (0.4 mg total) under the tongue every 5 (five) minutes as needed for chest pain.   ONDANSETRON (ZOFRAN-ODT) 4 MG DISINTEGRATING TABLET    Take 4 mg by mouth every 8 (eight) hours as needed for nausea or vomiting.   ONETOUCH VERIO TEST STRIP       PANTOPRAZOLE (PROTONIX) 40 MG TABLET    take 1 tablet by mouth once daily   TICAGRELOR (BRILINTA) 90 MG TABS TABLET    Take 1 tablet (90 mg total) by mouth 2 (two) times daily.   VALACYCLOVIR (VALTREX) 500 MG TABLET    Take 500 mg by mouth daily.   Modified Medications   No medications on file  Discontinued Medications   EMPAGLIFLOZIN-METFORMIN HCL (SYNJARDY) 03-999 MG TABS    Take 1 tablet by mouth daily.   EMPAGLIFLOZIN-METFORMIN HCL ER (SYNJARDY XR) 25-1000 MG TB24    Take 1 tablet by mouth daily.   TRAMADOL (ULTRAM) 50 MG TABLET    Take 1 tablet (50 mg total) by mouth every 6 (six) hours as needed for severe  pain.

## 2018-02-22 ENCOUNTER — Encounter (HOSPITAL_COMMUNITY): Payer: Self-pay

## 2018-02-24 ENCOUNTER — Encounter (HOSPITAL_COMMUNITY)
Admission: RE | Admit: 2018-02-24 | Discharge: 2018-02-24 | Disposition: A | Discharge status: Home / Self Care | Payer: Self-pay | Source: Ambulatory Visit | Attending: Interventional Cardiology | Admitting: Interventional Cardiology

## 2018-02-27 ENCOUNTER — Encounter (HOSPITAL_COMMUNITY): Payer: Self-pay

## 2018-03-01 ENCOUNTER — Encounter (HOSPITAL_COMMUNITY)
Admission: RE | Admit: 2018-03-01 | Discharge: 2018-03-01 | Disposition: A | Discharge status: Home / Self Care | Payer: Self-pay | Source: Ambulatory Visit | Attending: Interventional Cardiology | Admitting: Interventional Cardiology

## 2018-03-03 ENCOUNTER — Encounter (HOSPITAL_COMMUNITY): Payer: Self-pay

## 2018-03-06 ENCOUNTER — Encounter (HOSPITAL_COMMUNITY)
Admission: RE | Admit: 2018-03-06 | Discharge: 2018-03-06 | Disposition: A | Discharge status: Home / Self Care | Payer: BC Managed Care – PPO | Source: Ambulatory Visit | Attending: Interventional Cardiology | Admitting: Interventional Cardiology

## 2018-03-08 ENCOUNTER — Encounter (HOSPITAL_COMMUNITY)
Admission: RE | Admit: 2018-03-08 | Discharge: 2018-03-08 | Disposition: A | Discharge status: Home / Self Care | Payer: BC Managed Care – PPO | Source: Ambulatory Visit | Attending: Interventional Cardiology | Admitting: Interventional Cardiology

## 2018-03-10 ENCOUNTER — Encounter (HOSPITAL_COMMUNITY): Payer: Self-pay

## 2018-03-13 ENCOUNTER — Encounter (HOSPITAL_COMMUNITY): Payer: Self-pay

## 2018-03-15 ENCOUNTER — Encounter (HOSPITAL_COMMUNITY): Payer: Self-pay

## 2018-03-15 DIAGNOSIS — I2111 ST elevation (STEMI) myocardial infarction involving right coronary artery: Secondary | ICD-10-CM | POA: Insufficient documentation

## 2018-03-15 DIAGNOSIS — Z955 Presence of coronary angioplasty implant and graft: Secondary | ICD-10-CM | POA: Insufficient documentation

## 2018-03-17 ENCOUNTER — Encounter (HOSPITAL_COMMUNITY): Payer: Self-pay

## 2018-03-20 ENCOUNTER — Encounter (HOSPITAL_COMMUNITY): Payer: Self-pay

## 2018-03-22 ENCOUNTER — Encounter (HOSPITAL_COMMUNITY)
Admission: RE | Admit: 2018-03-22 | Discharge: 2018-03-22 | Disposition: A | Discharge status: Home / Self Care | Payer: Self-pay | Source: Ambulatory Visit | Attending: Interventional Cardiology | Admitting: Interventional Cardiology

## 2018-03-24 ENCOUNTER — Encounter (HOSPITAL_COMMUNITY)
Admission: RE | Admit: 2018-03-24 | Discharge: 2018-03-24 | Disposition: A | Discharge status: Home / Self Care | Payer: Self-pay | Source: Ambulatory Visit | Attending: Interventional Cardiology | Admitting: Interventional Cardiology

## 2018-03-27 ENCOUNTER — Encounter (HOSPITAL_COMMUNITY): Payer: Self-pay

## 2018-03-29 ENCOUNTER — Encounter (HOSPITAL_COMMUNITY)
Admission: RE | Admit: 2018-03-29 | Discharge: 2018-03-29 | Disposition: A | Discharge status: Home / Self Care | Payer: Self-pay | Source: Ambulatory Visit | Attending: Interventional Cardiology | Admitting: Interventional Cardiology

## 2018-03-31 ENCOUNTER — Encounter (HOSPITAL_COMMUNITY)
Admission: RE | Admit: 2018-03-31 | Discharge: 2018-03-31 | Disposition: A | Discharge status: Home / Self Care | Payer: BC Managed Care – PPO | Source: Ambulatory Visit | Attending: Interventional Cardiology | Admitting: Interventional Cardiology

## 2018-04-03 ENCOUNTER — Encounter (HOSPITAL_COMMUNITY)
Admission: RE | Admit: 2018-04-03 | Discharge: 2018-04-03 | Disposition: A | Discharge status: Home / Self Care | Payer: Self-pay | Source: Ambulatory Visit | Attending: Interventional Cardiology | Admitting: Interventional Cardiology

## 2018-04-05 ENCOUNTER — Encounter (HOSPITAL_COMMUNITY): Payer: Self-pay

## 2018-04-06 ENCOUNTER — Telehealth: Payer: Self-pay | Admitting: Pulmonary Disease

## 2018-04-06 NOTE — Telephone Encounter (Signed)
PA initiated on www.covermymeds.com today.  Key: [id number] for Fluticasone/Salmeterol 232/14cg It takes 7-10 business days for decision of approval or denial.

## 2018-04-07 ENCOUNTER — Encounter (HOSPITAL_COMMUNITY)
Admission: RE | Admit: 2018-04-07 | Discharge: 2018-04-07 | Disposition: A | Discharge status: Home / Self Care | Payer: Self-pay | Source: Ambulatory Visit | Attending: Interventional Cardiology | Admitting: Interventional Cardiology

## 2018-04-12 ENCOUNTER — Encounter (HOSPITAL_COMMUNITY): Payer: Self-pay

## 2018-04-14 ENCOUNTER — Encounter (HOSPITAL_COMMUNITY)
Admission: RE | Admit: 2018-04-14 | Discharge: 2018-04-14 | Disposition: A | Discharge status: Home / Self Care | Payer: BC Managed Care – PPO | Source: Ambulatory Visit | Attending: Interventional Cardiology | Admitting: Interventional Cardiology

## 2018-04-17 ENCOUNTER — Encounter (HOSPITAL_COMMUNITY): Payer: Self-pay

## 2018-04-17 DIAGNOSIS — I2111 ST elevation (STEMI) myocardial infarction involving right coronary artery: Secondary | ICD-10-CM | POA: Insufficient documentation

## 2018-04-17 DIAGNOSIS — Z955 Presence of coronary angioplasty implant and graft: Secondary | ICD-10-CM | POA: Insufficient documentation

## 2018-04-19 ENCOUNTER — Encounter (HOSPITAL_COMMUNITY)
Admission: RE | Admit: 2018-04-19 | Discharge: 2018-04-19 | Disposition: A | Discharge status: Home / Self Care | Payer: Self-pay | Source: Ambulatory Visit | Attending: Interventional Cardiology | Admitting: Interventional Cardiology

## 2018-04-20 ENCOUNTER — Other Ambulatory Visit: Payer: Self-pay | Admitting: Pulmonary Disease

## 2018-04-21 ENCOUNTER — Other Ambulatory Visit: Payer: Self-pay | Admitting: Interventional Cardiology

## 2018-04-21 ENCOUNTER — Encounter (HOSPITAL_COMMUNITY): Payer: Self-pay

## 2018-04-21 NOTE — Telephone Encounter (Signed)
Refill request for Xanax 0.58m- patient takes 1/2 tablet in am 1/2 tablet mid afternoon and 1 tablet at bedtime as needed.  Last OV: 02/20/2018 Next OV: 06/06/2018  Last ordered by : Dr. NLenna Gilford Quantity: 30 tabs  Dr. NLenna Gilfordplease advise if can refill  Allergies  Allergen Reactions  . Actos [Pioglitazone] Other (See Comments)    REACTION: pt states INTOL w/ edema  . Codeine Other (See Comments)    REACTION: vomiting  . Januvia [Sitagliptin] Nausea Only  . Lactose Intolerance (Gi) Diarrhea  . Morphine Nausea Only and Nausea And Vomiting    REACTION: vomiting REACTION: vomiting    Current Outpatient Medications on File Prior to Visit  Medication Sig Dispense Refill  . albuterol (PROAIR HFA) 108 (90 Base) MCG/ACT inhaler Inhale 1-2 puffs into the lungs every 6 (six) hours as needed for wheezing or shortness of breath. 1 Inhaler 3  . ALPRAZolam (XANAX) 0.5 MG tablet Take 1 tablet by mouth daily at bedtime (Patient taking differently: Take 1/2 tab by mouth in mid morning and mid afternoon and then take 1 tab once daily at bedtime if needed) 30 tablet 5  . ALPRAZolam (XANAX) 0.5 MG tablet 1/2 tab in morning,1/2 tab in afternoon, 1/2 tab at bedtime 60 tablet 0  . aspirin 81 MG chewable tablet Chew 1 tablet (81 mg total) by mouth daily.    . Cyanocobalamin 1000 MCG/ML KIT Inject 1,000 mg as directed every 30 (thirty) days.    .Marland Kitchendiltiazem (CARTIA XT) 240 MG 24 hr capsule Take 1 capsule (240 mg total) by mouth daily. 90 capsule 1  . Empagliflozin-metFORMIN HCl ER (SYNJARDY XR) 25-1000 MG TB24 Take 1 tablet by mouth daily.    . Evolocumab (REPATHA SURECLICK) 1800MG/ML SOAJ Inject 1 pen into the skin every 14 (fourteen) days. 2 pen 11  . fluticasone (FLONASE) 50 MCG/ACT nasal spray Place 2 sprays into both nostrils at bedtime. (Patient not taking: Reported on 02/20/2018) 16 g 2  . Fluticasone-Salmeterol (AIRDUO RESPICLICK 2349/17 2915-05MCG/ACT AEPB Inhale 2 puffs into the lungs 2 (two) times daily. 1  each 5  . furosemide (LASIX) 20 MG tablet Take 20 mg by mouth daily as needed for fluid or edema.    .Marland KitchenHP ACTHAR 80 UNIT/ML injectable gel Inject 80 Units into the skin 3 (three) times a week.     . losartan (COZAAR) 100 MG tablet take 1 tablet by mouth once daily 30 tablet 10  . metoprolol tartrate (LOPRESSOR) 50 MG tablet TAKE 1 TABLET BY MOUTH TWICE A DAY 60 tablet 2  . mometasone-formoterol (DULERA) 100-5 MCG/ACT AERO Inhale 2 puffs into the lungs 2 (two) times daily.    . nitroGLYCERIN (NITROSTAT) 0.4 MG SL tablet Place 1 tablet (0.4 mg total) under the tongue every 5 (five) minutes as needed for chest pain. 25 tablet 2  . ondansetron (ZOFRAN-ODT) 4 MG disintegrating tablet Take 4 mg by mouth every 8 (eight) hours as needed for nausea or vomiting.    .Glory RosebushVERIO test strip   1  . pantoprazole (PROTONIX) 40 MG tablet take 1 tablet by mouth once daily 90 tablet 3  . ticagrelor (BRILINTA) 90 MG TABS tablet Take 1 tablet (90 mg total) by mouth 2 (two) times daily. 180 tablet 3  . traMADol (ULTRAM) 50 MG tablet Take 1 tablet (50 mg total) by mouth 3 (three) times daily as needed. 90 tablet 0  . valACYclovir (VALTREX) 500 MG tablet Take 500 mg by mouth  daily.      No current facility-administered medications on file prior to visit.

## 2018-04-24 ENCOUNTER — Encounter (HOSPITAL_COMMUNITY): Payer: Self-pay

## 2018-04-24 ENCOUNTER — Other Ambulatory Visit: Payer: Self-pay | Admitting: *Deleted

## 2018-04-24 MED ORDER — ALPRAZOLAM 0.5 MG PO TABS: ORAL_TABLET | ORAL | 3 refills | Status: DC

## 2018-04-25 ENCOUNTER — Other Ambulatory Visit: Payer: Self-pay | Admitting: Cardiology

## 2018-04-26 ENCOUNTER — Encounter (HOSPITAL_COMMUNITY)
Admission: RE | Admit: 2018-04-26 | Discharge: 2018-04-26 | Disposition: A | Discharge status: Home / Self Care | Payer: Self-pay | Source: Ambulatory Visit | Attending: Interventional Cardiology | Admitting: Interventional Cardiology

## 2018-04-28 ENCOUNTER — Encounter (HOSPITAL_COMMUNITY): Payer: Self-pay

## 2018-05-01 ENCOUNTER — Encounter (HOSPITAL_COMMUNITY)
Admission: RE | Admit: 2018-05-01 | Discharge: 2018-05-01 | Disposition: A | Discharge status: Home / Self Care | Payer: Self-pay | Source: Ambulatory Visit | Attending: Interventional Cardiology | Admitting: Interventional Cardiology

## 2018-05-03 ENCOUNTER — Encounter (HOSPITAL_COMMUNITY)
Admission: RE | Admit: 2018-05-03 | Discharge: 2018-05-03 | Disposition: A | Discharge status: Home / Self Care | Payer: Self-pay | Source: Ambulatory Visit | Attending: Interventional Cardiology | Admitting: Interventional Cardiology

## 2018-05-05 ENCOUNTER — Encounter (HOSPITAL_COMMUNITY)
Admission: RE | Admit: 2018-05-05 | Discharge: 2018-05-05 | Disposition: A | Discharge status: Home / Self Care | Payer: Self-pay | Source: Ambulatory Visit | Attending: Interventional Cardiology | Admitting: Interventional Cardiology

## 2018-05-08 ENCOUNTER — Encounter (HOSPITAL_COMMUNITY): Payer: Self-pay

## 2018-05-10 ENCOUNTER — Encounter (HOSPITAL_COMMUNITY): Payer: Self-pay

## 2018-05-12 ENCOUNTER — Encounter (HOSPITAL_COMMUNITY)
Admission: RE | Admit: 2018-05-12 | Discharge: 2018-05-12 | Disposition: A | Discharge status: Home / Self Care | Payer: BC Managed Care – PPO | Source: Ambulatory Visit | Attending: Interventional Cardiology | Admitting: Interventional Cardiology

## 2018-05-15 ENCOUNTER — Encounter (HOSPITAL_COMMUNITY)
Admission: RE | Admit: 2018-05-15 | Discharge: 2018-05-15 | Disposition: A | Discharge status: Home / Self Care | Payer: Self-pay | Source: Ambulatory Visit | Attending: Interventional Cardiology | Admitting: Interventional Cardiology

## 2018-05-15 DIAGNOSIS — I2111 ST elevation (STEMI) myocardial infarction involving right coronary artery: Secondary | ICD-10-CM | POA: Insufficient documentation

## 2018-05-15 DIAGNOSIS — Z955 Presence of coronary angioplasty implant and graft: Secondary | ICD-10-CM | POA: Insufficient documentation

## 2018-05-17 ENCOUNTER — Encounter (HOSPITAL_COMMUNITY)
Admission: RE | Admit: 2018-05-17 | Discharge: 2018-05-17 | Disposition: A | Discharge status: Home / Self Care | Payer: Self-pay | Source: Ambulatory Visit | Attending: Interventional Cardiology | Admitting: Interventional Cardiology

## 2018-05-19 ENCOUNTER — Encounter (HOSPITAL_COMMUNITY)
Admission: RE | Admit: 2018-05-19 | Discharge: 2018-05-19 | Disposition: A | Discharge status: Home / Self Care | Payer: Self-pay | Source: Ambulatory Visit | Attending: Interventional Cardiology | Admitting: Interventional Cardiology

## 2018-05-22 ENCOUNTER — Encounter (HOSPITAL_COMMUNITY)
Admission: RE | Admit: 2018-05-22 | Discharge: 2018-05-22 | Disposition: A | Discharge status: Home / Self Care | Payer: Self-pay | Source: Ambulatory Visit | Attending: Interventional Cardiology | Admitting: Interventional Cardiology

## 2018-05-24 ENCOUNTER — Encounter (HOSPITAL_COMMUNITY): Payer: Self-pay

## 2018-05-26 ENCOUNTER — Encounter (HOSPITAL_COMMUNITY): Payer: Self-pay

## 2018-05-29 ENCOUNTER — Encounter (HOSPITAL_COMMUNITY): Payer: Self-pay

## 2018-05-30 ENCOUNTER — Other Ambulatory Visit (INDEPENDENT_AMBULATORY_CARE_PROVIDER_SITE_OTHER): Payer: BC Managed Care – PPO

## 2018-05-30 ENCOUNTER — Ambulatory Visit: Payer: BC Managed Care – PPO | Admitting: Pulmonary Disease

## 2018-05-30 ENCOUNTER — Encounter: Payer: Self-pay | Admitting: Pulmonary Disease

## 2018-05-30 VITALS — BP 134/76 | HR 83 | Temp 98.2°F | Ht 62.5 in | Wt 174.6 lb

## 2018-05-30 DIAGNOSIS — E7800 Pure hypercholesterolemia, unspecified: Secondary | ICD-10-CM

## 2018-05-30 DIAGNOSIS — F411 Generalized anxiety disorder: Secondary | ICD-10-CM

## 2018-05-30 DIAGNOSIS — J449 Chronic obstructive pulmonary disease, unspecified: Secondary | ICD-10-CM

## 2018-05-30 DIAGNOSIS — Z862 Personal history of diseases of the blood and blood-forming organs and certain disorders involving the immune mechanism: Secondary | ICD-10-CM

## 2018-05-30 LAB — COMPREHENSIVE METABOLIC PANEL
ALT: 15 U/L (ref 0–35)
AST: 17 U/L (ref 0–37)
Albumin: 3.9 g/dL (ref 3.5–5.2)
Alkaline Phosphatase: 57 U/L (ref 39–117)
BUN: 15 mg/dL (ref 6–23)
CALCIUM: 9.4 mg/dL (ref 8.4–10.5)
CHLORIDE: 106 meq/L (ref 96–112)
CO2: 29 mEq/L (ref 19–32)
CREATININE: 0.81 mg/dL (ref 0.40–1.20)
GFR: 91.67 mL/min (ref >60.00)
Glucose, Bld: 118 mg/dL — ABNORMAL HIGH (ref 70–99)
POTASSIUM: 3.6 meq/L (ref 3.5–5.1)
Sodium: 143 mEq/L (ref 135–145)
Total Bilirubin: 0.5 mg/dL (ref 0.2–1.2)
Total Protein: 6.8 g/dL (ref 6.0–8.3)

## 2018-05-30 LAB — CBC WITH DIFFERENTIAL/PLATELET
Basophils Absolute: 0.1 10*3/uL (ref 0.0–0.1)
Basophils Relative: 0.6 % (ref 0.0–3.0)
Eosinophils Absolute: 0 10*3/uL (ref 0.0–0.7)
Eosinophils Relative: 0.4 % (ref 0.0–5.0)
HEMATOCRIT: 39.6 % (ref 36.0–46.0)
HEMOGLOBIN: 13.2 g/dL (ref 12.0–15.0)
LYMPHS PCT: 19.1 % (ref 12.0–46.0)
Lymphs Abs: 1.8 10*3/uL (ref 0.7–4.0)
MCHC: 33.4 g/dL (ref 30.0–36.0)
MCV: 92.6 fl (ref 78.0–100.0)
MONOS PCT: 8.7 % (ref 3.0–12.0)
Monocytes Absolute: 0.8 10*3/uL (ref 0.1–1.0)
NEUTROS ABS: 6.7 10*3/uL (ref 1.4–7.7)
Neutrophils Relative %: 71.2 % (ref 43.0–77.0)
PLATELETS: 326 10*3/uL (ref 150.0–400.0)
RBC: 4.27 Mil/uL (ref 3.87–5.11)
RDW: 14.1 % (ref 11.5–15.5)
WBC: 9.5 10*3/uL (ref 4.0–10.5)

## 2018-05-30 LAB — LIPID PANEL
CHOL/HDL RATIO: 4
CHOLESTEROL: 202 mg/dL — AB (ref 0–200)
HDL: 56.3 mg/dL (ref >39.00)
LDL CALC: 109 mg/dL — AB (ref 0–99)
NonHDL: 145.32
Triglycerides: 181 mg/dL — ABNORMAL HIGH (ref 0.0–149.0)
VLDL: 36.2 mg/dL (ref 0.0–40.0)

## 2018-05-30 LAB — TSH: TSH: 0.41 u[IU]/mL (ref 0.35–4.50)

## 2018-05-30 MED ORDER — ALPRAZOLAM 0.5 MG PO TABS: ORAL_TABLET | ORAL | 5 refills | Status: DC

## 2018-05-30 NOTE — Patient Instructions (Signed)
Today we updated your med list in our EPIC system...    Continue your current medications the same...  Today we checked your follow up FASTING blood work    We will contact you w/ the results when available...   We discussed TINNITUS & using a background noice to help block the noise...  We discussed benign forgetfulness & using puzzles, or LUMOSITY on the computer to help train your brain...  Call for any questions...  Let's plan a follow up visit in 4-5 months, sooner if needed for problems.Marland KitchenMarland Kitchen

## 2018-05-31 ENCOUNTER — Encounter (HOSPITAL_COMMUNITY)
Admission: RE | Admit: 2018-05-31 | Discharge: 2018-05-31 | Disposition: A | Discharge status: Home / Self Care | Payer: Self-pay | Source: Ambulatory Visit | Attending: Interventional Cardiology | Admitting: Interventional Cardiology

## 2018-05-31 ENCOUNTER — Ambulatory Visit (HOSPITAL_COMMUNITY)
Admission: RE | Admit: 2018-05-31 | Discharge: 2018-05-31 | Disposition: A | Discharge status: Home / Self Care | Payer: BC Managed Care – PPO | Source: Ambulatory Visit | Attending: Primary Care | Admitting: Primary Care

## 2018-05-31 ENCOUNTER — Telehealth: Payer: Self-pay | Admitting: Primary Care

## 2018-05-31 ENCOUNTER — Other Ambulatory Visit: Payer: Self-pay | Admitting: Primary Care

## 2018-05-31 ENCOUNTER — Ambulatory Visit: Payer: BC Managed Care – PPO | Admitting: Primary Care

## 2018-05-31 ENCOUNTER — Encounter: Payer: Self-pay | Admitting: Primary Care

## 2018-05-31 ENCOUNTER — Ambulatory Visit: Payer: BC Managed Care – PPO | Admitting: Pulmonary Disease

## 2018-05-31 ENCOUNTER — Encounter (HOSPITAL_COMMUNITY): Payer: Self-pay

## 2018-05-31 VITALS — BP 120/82 | HR 87 | Ht 62.0 in | Wt 176.0 lb

## 2018-05-31 DIAGNOSIS — T1490XA Injury, unspecified, initial encounter: Secondary | ICD-10-CM | POA: Insufficient documentation

## 2018-05-31 DIAGNOSIS — W19XXXA Unspecified fall, initial encounter: Secondary | ICD-10-CM | POA: Diagnosis not present

## 2018-05-31 DIAGNOSIS — R51 Headache: Secondary | ICD-10-CM | POA: Diagnosis not present

## 2018-05-31 DIAGNOSIS — I7090 Unspecified atherosclerosis: Secondary | ICD-10-CM | POA: Insufficient documentation

## 2018-05-31 DIAGNOSIS — D320 Benign neoplasm of cerebral meninges: Secondary | ICD-10-CM | POA: Diagnosis not present

## 2018-05-31 DIAGNOSIS — W101XXA Fall (on)(from) sidewalk curb, initial encounter: Secondary | ICD-10-CM | POA: Diagnosis not present

## 2018-05-31 DIAGNOSIS — Z043 Encounter for examination and observation following other accident: Secondary | ICD-10-CM | POA: Insufficient documentation

## 2018-05-31 NOTE — Telephone Encounter (Signed)
Head CT completed, no acute intracranial findings. I called patient with results. Ok to resume Brilinta.   Can you please send a note to [facility] cardiopulmonary rehab stating she is cleared to return to therapy on Monday 7/22.

## 2018-05-31 NOTE — Progress Notes (Addendum)
@Patient  ID: Jocelyn [name], female    DOB: 30-Mar-1956, 62 y.o.   MRN: [id number]  Chief Complaint  Patient presents with  . Acute Visit    Fell and hit chin ,chest soreness    Referring provider: Noralee Space, MD  HPI: 62 year old female. Hx asthma, sarcoid, mild OSA, high blood pressure, CAD, DM and GERD. Patient of Dr. [name], seen yesterday.    05/31/2018 Patient fell yesterday while trying to avoid a Bee. States that she fell sideways onto her left side, hitting her chin and chest. EMS arrived at scene. Patient is anticoagulated on Brilinta. Unsure if she hit her head. Denies LOC. Reports HA yesterday, she took tylenol and tramadol. She has a large hematoma to left chin, scattered ecchymosis to left chest wall, upper abdomen and small abrasions to bilateral knees. No increase in confusion. Neuos intact.     Allergies  Allergen Reactions  . Actos [Pioglitazone] Other (See Comments)    REACTION: pt states INTOL w/ edema  . Codeine Other (See Comments)    REACTION: vomiting  . Januvia [Sitagliptin] Nausea Only  . Lactose Intolerance (Gi) Diarrhea  . Morphine Nausea Only and Nausea And Vomiting    REACTION: vomiting REACTION: vomiting    Immunization History  Administered Date(s) Administered  . Influenza Split 08/16/2011, 08/16/2012, 08/16/2013, 09/04/2014  . Influenza Whole 09/16/2009, 08/15/2010  . Influenza,inj,Quad PF,6+ Mos 08/30/2016, 06/16/2017  . Influenza-Unspecified 08/19/2015  . Pneumococcal Polysaccharide-23 11/15/2004  . Tdap 02/22/2011    Past Medical History:  Diagnosis Date  . Acute bronchitis   . Allergic rhinitis, cause unspecified   . Anemia   . Anxiety   . B12 deficiency   . BV (bacterial vaginosis) 06/22/1996  . Calculus of kidney   . Calculus of kidney   . Coronary atherosclerosis of unspecified type of vessel, native or graft   . Dizziness   . Essential hypertension, benign   . Fibroid 2003  . Fibromyalgia   . H/O dysmenorrhea 2008  .  H/O varicella   . Headache(784.0)    frequently  . HSV-2 infection 2009  . Hyperplastic colon polyp 05/16/2014  . Irritable bowel syndrome   . Meniere's disease, unspecified   . Menses, irregular 2003  . Myalgia and myositis, unspecified   . Obstructive sleep apnea (adult) (pediatric)   . Perimenopausal symptoms 2003  . Pure hypercholesterolemia   . Sarcoidosis   . Type II or unspecified type diabetes mellitus without mention of complication, not stated as uncontrolled   . Unspecified venous (peripheral) insufficiency   . Vitamin D deficiency   . Vulvitis 2010  . Yeast infection     Tobacco History: Social History   Tobacco Use  Smoking Status Never Smoker  Smokeless Tobacco Never Used  Tobacco Comment   Daily Caffeine - 1  Exercise 2-3 times/weekly   Counseling given: Not Answered Comment: Daily Caffeine - 1  Exercise 2-3 times/weekly   Outpatient Medications Prior to Visit  Medication Sig Dispense Refill  . albuterol (PROAIR HFA) 108 (90 Base) MCG/ACT inhaler Inhale 1-2 puffs into the lungs every 6 (six) hours as needed for wheezing or shortness of breath. 1 Inhaler 3  . ALPRAZolam (XANAX) 0.5 MG tablet 1/2 morning, 1/2 in afternoon, and 1 at bedtime 60 tablet 5  . aspirin 81 MG chewable tablet Chew 1 tablet (81 mg total) by mouth daily.    . Cyanocobalamin 1000 MCG/ML KIT Inject 1,000 mg as directed every 30 (thirty) days.    [name]  diltiazem (CARTIA XT) 240 MG 24 hr capsule Take 1 capsule (240 mg total) by mouth daily. 90 capsule 1  . Empagliflozin-metFORMIN HCl ER (SYNJARDY XR) 25-1000 MG TB24 Take 1 tablet by mouth daily.    . fluticasone (FLONASE) 50 MCG/ACT nasal spray Place 2 sprays into both nostrils at bedtime. 16 g 2  . Fluticasone-Salmeterol (AIRDUO RESPICLICK 295/62) 130-86 MCG/ACT AEPB Inhale 2 puffs into the lungs 2 (two) times daily. 1 each 5  . furosemide (LASIX) 20 MG tablet Take 20 mg by mouth daily as needed for fluid or edema.    [name] HP ACTHAR 80 UNIT/ML  injectable gel Inject 80 Units into the skin 3 (three) times a week.     . losartan (COZAAR) 100 MG tablet take 1 tablet by mouth once daily 30 tablet 10  . metoprolol tartrate (LOPRESSOR) 50 MG tablet TAKE 1 TABLET BY MOUTH TWICE A DAY 60 tablet 6  . mometasone-formoterol (DULERA) 100-5 MCG/ACT AERO Inhale 2 puffs into the lungs 2 (two) times daily.    . nitroGLYCERIN (NITROSTAT) 0.4 MG SL tablet Place 1 tablet (0.4 mg total) under the tongue every 5 (five) minutes as needed for chest pain. 25 tablet 2  . ondansetron (ZOFRAN-ODT) 4 MG disintegrating tablet Take 4 mg by mouth every 8 (eight) hours as needed for nausea or vomiting.    Glory Rosebush VERIO test strip   1  . pantoprazole (PROTONIX) 40 MG tablet take 1 tablet by mouth once daily 90 tablet 3  . REPATHA SURECLICK 578 MG/ML SOAJ INJECT 1 PEN INTO THE SKIN EVERY 14 (FOURTEEN) DAYS. REFRIGERATE. 2 pen 11  . ticagrelor (BRILINTA) 90 MG TABS tablet Take 1 tablet (90 mg total) by mouth 2 (two) times daily. 180 tablet 3  . traMADol (ULTRAM) 50 MG tablet Take 1 tablet (50 mg total) by mouth 3 (three) times daily as needed. 90 tablet 0  . valACYclovir (VALTREX) 500 MG tablet Take 500 mg by mouth daily.      No facility-administered medications prior to visit.       Review of Systems  Review of Systems  Constitutional: Negative.   HENT: Negative.   Respiratory: Negative.   Musculoskeletal: Negative for back pain and neck pain.       Sore all over from fall     Physical Exam  BP 120/82   Pulse 87   Ht 5' 2"  (1.575 m)   Wt 176 lb (79.8 kg)   SpO2 97%   BMI 32.19 kg/m  Physical Exam  Constitutional: She is oriented to person, place, and time. She appears well-developed and well-nourished.  HENT:  Head: Normocephalic.  Eyes: Conjunctivae, EOM and lids are normal.  Neck: Normal range of motion and full passive range of motion without pain. Neck supple.  Cardiovascular: Normal rate and regular rhythm.  Pulmonary/Chest: Effort  normal and breath sounds normal.  Neurological: She is alert and oriented to person, place, and time. She has normal strength. She is not disoriented.  Skin: Skin is warm and dry. Abrasion, bruising and ecchymosis noted.     Psychiatric: She has a normal mood and affect. Her speech is normal and behavior is normal.     Lab Results:  CBC    Component Value Date/Time   WBC 9.5 05/30/2018 1444   RBC 4.27 05/30/2018 1444   HGB 13.2 05/30/2018 1444   HCT 39.6 05/30/2018 1444   PLT 326.0 05/30/2018 1444   MCV 92.6 05/30/2018 1444   MCH 29.9  06/16/2017 0150   MCHC 33.4 05/30/2018 1444   RDW 14.1 05/30/2018 1444   LYMPHSABS 1.8 05/30/2018 1444   MONOABS 0.8 05/30/2018 1444   EOSABS 0.0 05/30/2018 1444   BASOSABS 0.1 05/30/2018 1444    BMET    Component Value Date/Time   NA 143 05/30/2018 1444   NA 142 07/08/2017 1611   K 3.6 05/30/2018 1444   CL 106 05/30/2018 1444   CO2 29 05/30/2018 1444   GLUCOSE 118 (H) 05/30/2018 1444   BUN 15 05/30/2018 1444   BUN 17 07/08/2017 1611   CREATININE 0.81 05/30/2018 1444   CREATININE 0.83 06/01/2016 1254   CALCIUM 9.4 05/30/2018 1444   GFRNONAA 57 (L) 07/08/2017 1611   GFRAA 66 07/08/2017 1611    BNP    Component Value Date/Time   BNP 8.0 03/26/2016 1609    ProBNP    Component Value Date/Time   PROBNP 26.0 02/13/2014 1726    Imaging: No results found.   Assessment & Plan:    62 year old female, sustained mechanical fall yesterday 7/16. Patient unsure if she hit the back of her head. She is anticoagulated on Brilinta. Neuros remain intact, exam is benign. Large hematoma to left chin. Reports HA last night. Needs stat Head CT d/t trauma to r/o bleed. Holding anticoag until scan resulted. Patient instructed to take it easy for the next 3 days; rest with light activity is ok. If pt notices change in MS or increase in confusion patient needs to present to ED.   Fall on or from sidewalk curb, initial encounter [facility] on 7/16,  sustained mild head trauma. No LOC.  - Patient is anticoagulated on brilinta - Needs Head CT, hold brilinta until resulted  CT head negative, patient may return to cardiopulmonary rehab on Monday, July 22.   Martyn Ehrich, NP 05/31/2018

## 2018-05-31 NOTE — Patient Instructions (Signed)
Needs Head CT due to head trauma and current blood thinner therapy Take tylenol as needed for pain, ice chin for 108mins 4 times a day  Rest for next 3 days, light activity is ok  If CT head is normal will clear to return to cardio rehab  Any change in mental status or increased confusion present to ED

## 2018-05-31 NOTE — Assessment & Plan Note (Signed)
-   Fell on 7/16, sustained mild head trauma. No LOC.  - Patient is anticoagulated on brilinta - Needs Head CT, hold brilinta until resulted

## 2018-06-01 NOTE — Telephone Encounter (Signed)
I have [facility] and they stated  That all they need is the note from yesterday to say that the patient is okay to resume therapy again and that she is cleared.

## 2018-06-02 ENCOUNTER — Encounter (HOSPITAL_COMMUNITY): Payer: Self-pay

## 2018-06-05 ENCOUNTER — Encounter (HOSPITAL_COMMUNITY): Payer: Self-pay

## 2018-06-06 ENCOUNTER — Ambulatory Visit: Payer: BC Managed Care – PPO | Admitting: Pulmonary Disease

## 2018-06-07 ENCOUNTER — Encounter (HOSPITAL_COMMUNITY): Payer: Self-pay

## 2018-06-09 ENCOUNTER — Encounter (HOSPITAL_COMMUNITY): Payer: Self-pay

## 2018-06-12 ENCOUNTER — Encounter (HOSPITAL_COMMUNITY): Payer: Self-pay

## 2018-06-14 ENCOUNTER — Encounter (HOSPITAL_COMMUNITY): Payer: Self-pay

## 2018-06-16 ENCOUNTER — Encounter (HOSPITAL_COMMUNITY): Payer: Self-pay

## 2018-06-16 DIAGNOSIS — Z955 Presence of coronary angioplasty implant and graft: Secondary | ICD-10-CM | POA: Insufficient documentation

## 2018-06-16 DIAGNOSIS — I2111 ST elevation (STEMI) myocardial infarction involving right coronary artery: Secondary | ICD-10-CM | POA: Insufficient documentation

## 2018-06-19 ENCOUNTER — Encounter: Payer: Self-pay | Admitting: Primary Care

## 2018-06-19 ENCOUNTER — Encounter (HOSPITAL_COMMUNITY): Payer: Self-pay

## 2018-06-19 LAB — HM DIABETES EYE EXAM

## 2018-06-21 ENCOUNTER — Encounter (HOSPITAL_COMMUNITY): Payer: Self-pay

## 2018-06-23 ENCOUNTER — Encounter (HOSPITAL_COMMUNITY): Payer: Self-pay

## 2018-06-26 ENCOUNTER — Encounter (HOSPITAL_COMMUNITY): Payer: Self-pay

## 2018-06-28 ENCOUNTER — Encounter (HOSPITAL_COMMUNITY): Payer: Self-pay

## 2018-06-30 ENCOUNTER — Encounter (HOSPITAL_COMMUNITY): Payer: Self-pay

## 2018-07-03 ENCOUNTER — Encounter (HOSPITAL_COMMUNITY): Payer: Self-pay

## 2018-07-05 ENCOUNTER — Encounter (HOSPITAL_COMMUNITY): Payer: Self-pay

## 2018-07-07 ENCOUNTER — Encounter (HOSPITAL_COMMUNITY): Payer: Self-pay

## 2018-07-10 ENCOUNTER — Encounter (HOSPITAL_COMMUNITY)
Admission: RE | Admit: 2018-07-10 | Discharge: 2018-07-10 | Disposition: A | Discharge status: Home / Self Care | Payer: BC Managed Care – PPO | Source: Ambulatory Visit | Attending: Interventional Cardiology | Admitting: Interventional Cardiology

## 2018-07-10 ENCOUNTER — Other Ambulatory Visit: Payer: Self-pay | Admitting: Interventional Cardiology

## 2018-07-12 ENCOUNTER — Encounter (HOSPITAL_COMMUNITY)
Admission: RE | Admit: 2018-07-12 | Discharge: 2018-07-12 | Disposition: A | Discharge status: Home / Self Care | Payer: BC Managed Care – PPO | Source: Ambulatory Visit | Attending: Interventional Cardiology | Admitting: Interventional Cardiology

## 2018-07-14 ENCOUNTER — Encounter (HOSPITAL_COMMUNITY): Payer: Self-pay

## 2018-07-15 ENCOUNTER — Other Ambulatory Visit: Payer: Self-pay | Admitting: Interventional Cardiology

## 2018-07-18 ENCOUNTER — Other Ambulatory Visit: Payer: Self-pay | Admitting: Cardiology

## 2018-07-19 ENCOUNTER — Encounter (HOSPITAL_COMMUNITY)
Admission: RE | Admit: 2018-07-19 | Discharge: 2018-07-19 | Disposition: A | Discharge status: Home / Self Care | Payer: BC Managed Care – PPO | Source: Ambulatory Visit | Attending: Interventional Cardiology | Admitting: Interventional Cardiology

## 2018-07-19 DIAGNOSIS — I2111 ST elevation (STEMI) myocardial infarction involving right coronary artery: Secondary | ICD-10-CM | POA: Insufficient documentation

## 2018-07-19 DIAGNOSIS — Z955 Presence of coronary angioplasty implant and graft: Secondary | ICD-10-CM | POA: Insufficient documentation

## 2018-07-21 ENCOUNTER — Encounter (HOSPITAL_COMMUNITY)
Admission: RE | Admit: 2018-07-21 | Discharge: 2018-07-21 | Disposition: A | Discharge status: Home / Self Care | Payer: Self-pay | Source: Ambulatory Visit | Attending: Interventional Cardiology | Admitting: Interventional Cardiology

## 2018-07-24 ENCOUNTER — Encounter (HOSPITAL_COMMUNITY): Payer: Self-pay

## 2018-07-26 ENCOUNTER — Encounter (HOSPITAL_COMMUNITY)
Admission: RE | Admit: 2018-07-26 | Discharge: 2018-07-26 | Disposition: A | Discharge status: Home / Self Care | Payer: Self-pay | Source: Ambulatory Visit | Attending: Interventional Cardiology | Admitting: Interventional Cardiology

## 2018-07-28 ENCOUNTER — Encounter (HOSPITAL_COMMUNITY): Payer: Self-pay

## 2018-07-31 ENCOUNTER — Encounter (HOSPITAL_COMMUNITY): Payer: Self-pay

## 2018-08-02 ENCOUNTER — Encounter (HOSPITAL_COMMUNITY)
Admission: RE | Admit: 2018-08-02 | Discharge: 2018-08-02 | Disposition: A | Discharge status: Home / Self Care | Payer: Self-pay | Source: Ambulatory Visit | Attending: Interventional Cardiology | Admitting: Interventional Cardiology

## 2018-08-04 ENCOUNTER — Encounter (HOSPITAL_COMMUNITY): Payer: Self-pay

## 2018-08-07 ENCOUNTER — Encounter (HOSPITAL_COMMUNITY)
Admission: RE | Admit: 2018-08-07 | Discharge: 2018-08-07 | Disposition: A | Discharge status: Home / Self Care | Payer: BC Managed Care – PPO | Source: Ambulatory Visit | Attending: Interventional Cardiology | Admitting: Interventional Cardiology

## 2018-08-09 ENCOUNTER — Encounter (HOSPITAL_COMMUNITY)
Admission: RE | Admit: 2018-08-09 | Discharge: 2018-08-09 | Disposition: A | Discharge status: Home / Self Care | Payer: Self-pay | Source: Ambulatory Visit | Attending: Interventional Cardiology | Admitting: Interventional Cardiology

## 2018-08-11 ENCOUNTER — Encounter (HOSPITAL_COMMUNITY): Payer: Self-pay

## 2018-08-11 ENCOUNTER — Telehealth: Payer: Self-pay | Admitting: Pulmonary Disease

## 2018-08-11 MED ORDER — METHYLPREDNISOLONE 4 MG PO TBPK: ORAL_TABLET | ORAL | 0 refills | Status: DC

## 2018-08-11 MED ORDER — AZITHROMYCIN 250 MG PO TABS: ORAL_TABLET | ORAL | 0 refills | Status: DC

## 2018-08-11 NOTE — Telephone Encounter (Signed)
Per SN:  Zpak Medrol Dose Pak   Called pt and advised message from the provider. Pt understood and verbalized understanding. Nothing further is needed.   Rx's sent in .

## 2018-08-11 NOTE — Telephone Encounter (Signed)
Spoke with pt pt, she has been fighting the congestion all week. She is coughing up colored mucus. She is coughing more and it is making her SOB. She has been using Mucinex and albuterol but still no relief.  She doesn't know if she has fever but feel hot when she wakes. Denies sore throat and states she can hear it in her chest. She would like to try an antibiotic before she has to come in. SN please advise.   [facility] [address]   Current Outpatient Medications on File Prior to Visit  Medication Sig Dispense Refill  . albuterol (PROAIR HFA) 108 (90 Base) MCG/ACT inhaler Inhale 1-2 puffs into the lungs every 6 (six) hours as needed for wheezing or shortness of breath. 1 Inhaler 3  . ALPRAZolam (XANAX) 0.5 MG tablet 1/2 morning, 1/2 in afternoon, and 1 at bedtime 60 tablet 5  . aspirin 81 MG chewable tablet Chew 1 tablet (81 mg total) by mouth daily.    [name] BRILINTA 90 MG TABS tablet TAKE 1 TABLET BY MOUTH TWICE A DAY 180 tablet 0  . CARTIA XT 240 MG 24 hr capsule TAKE 1 CAPSULE(240 MG) BY MOUTH DAILY 90 capsule 1  . Cyanocobalamin 1000 MCG/ML KIT Inject 1,000 mg as directed every 30 (thirty) days.    . Empagliflozin-metFORMIN HCl ER (SYNJARDY XR) 25-1000 MG TB24 Take 1 tablet by mouth daily.    . fluticasone (FLONASE) 50 MCG/ACT nasal spray Place 2 sprays into both nostrils at bedtime. 16 g 2  . Fluticasone-Salmeterol (AIRDUO RESPICLICK 270/62) 376-28 MCG/ACT AEPB Inhale 2 puffs into the lungs 2 (two) times daily. 1 each 5  . furosemide (LASIX) 20 MG tablet Take 20 mg by mouth daily as needed for fluid or edema.    [name] HP ACTHAR 80 UNIT/ML injectable gel Inject 80 Units into the skin 3 (three) times a week.     . losartan (COZAAR) 100 MG tablet TAKE 1 TABLET BY MOUTH ONCE DAILY 30 tablet 7  . metoprolol tartrate (LOPRESSOR) 50 MG tablet TAKE 1 TABLET BY MOUTH TWICE A DAY 60 tablet 6  . mometasone-formoterol (DULERA) 100-5 MCG/ACT AERO Inhale 2 puffs into the lungs 2 (two) times daily.    .  nitroGLYCERIN (NITROSTAT) 0.4 MG SL tablet Place 1 tablet (0.4 mg total) under the tongue every 5 (five) minutes as needed for chest pain. 25 tablet 2  . ondansetron (ZOFRAN-ODT) 4 MG disintegrating tablet Take 4 mg by mouth every 8 (eight) hours as needed for nausea or vomiting.    Glory Rosebush VERIO test strip   1  . pantoprazole (PROTONIX) 40 MG tablet take 1 tablet by mouth once daily 90 tablet 3  . REPATHA SURECLICK 315 MG/ML SOAJ INJECT 1 PEN INTO THE SKIN EVERY 14 (FOURTEEN) DAYS. REFRIGERATE. 2 pen 11  . traMADol (ULTRAM) 50 MG tablet Take 1 tablet (50 mg total) by mouth 3 (three) times daily as needed. 90 tablet 0  . valACYclovir (VALTREX) 500 MG tablet Take 500 mg by mouth daily.      No current facility-administered medications on file prior to visit.    Allergies  Allergen Reactions  . Actos [Pioglitazone] Other (See Comments)    REACTION: pt states INTOL w/ edema  . Codeine Other (See Comments)    REACTION: vomiting  . Januvia [Sitagliptin] Nausea Only  . Lactose Intolerance (Gi) Diarrhea  . Morphine Nausea Only and Nausea And Vomiting    REACTION: vomiting REACTION: vomiting

## 2018-08-14 ENCOUNTER — Encounter (HOSPITAL_COMMUNITY): Payer: Self-pay

## 2018-08-16 ENCOUNTER — Ambulatory Visit: Payer: BC Managed Care – PPO | Admitting: Pulmonary Disease

## 2018-08-16 ENCOUNTER — Ambulatory Visit (INDEPENDENT_AMBULATORY_CARE_PROVIDER_SITE_OTHER)
Admission: RE | Admit: 2018-08-16 | Discharge: 2018-08-16 | Disposition: A | Discharge status: Home / Self Care | Payer: BC Managed Care – PPO | Source: Ambulatory Visit | Attending: Pulmonary Disease | Admitting: Pulmonary Disease

## 2018-08-16 ENCOUNTER — Encounter: Payer: Self-pay | Admitting: Pulmonary Disease

## 2018-08-16 ENCOUNTER — Other Ambulatory Visit (INDEPENDENT_AMBULATORY_CARE_PROVIDER_SITE_OTHER): Payer: BC Managed Care – PPO

## 2018-08-16 ENCOUNTER — Encounter (HOSPITAL_COMMUNITY): Payer: Self-pay | Attending: Interventional Cardiology

## 2018-08-16 VITALS — BP 130/80 | HR 87 | Temp 98.0°F | Ht 62.0 in | Wt 171.6 lb

## 2018-08-16 DIAGNOSIS — E119 Type 2 diabetes mellitus without complications: Secondary | ICD-10-CM

## 2018-08-16 DIAGNOSIS — R0600 Dyspnea, unspecified: Secondary | ICD-10-CM | POA: Insufficient documentation

## 2018-08-16 DIAGNOSIS — Z862 Personal history of diseases of the blood and blood-forming organs and certain disorders involving the immune mechanism: Secondary | ICD-10-CM

## 2018-08-16 DIAGNOSIS — I2510 Atherosclerotic heart disease of native coronary artery without angina pectoris: Secondary | ICD-10-CM

## 2018-08-16 DIAGNOSIS — R0789 Other chest pain: Secondary | ICD-10-CM | POA: Diagnosis not present

## 2018-08-16 DIAGNOSIS — R0602 Shortness of breath: Secondary | ICD-10-CM | POA: Diagnosis not present

## 2018-08-16 DIAGNOSIS — I252 Old myocardial infarction: Secondary | ICD-10-CM

## 2018-08-16 DIAGNOSIS — E7800 Pure hypercholesterolemia, unspecified: Secondary | ICD-10-CM

## 2018-08-16 DIAGNOSIS — Z955 Presence of coronary angioplasty implant and graft: Secondary | ICD-10-CM

## 2018-08-16 DIAGNOSIS — J449 Chronic obstructive pulmonary disease, unspecified: Secondary | ICD-10-CM

## 2018-08-16 DIAGNOSIS — I10 Essential (primary) hypertension: Secondary | ICD-10-CM

## 2018-08-16 DIAGNOSIS — I2111 ST elevation (STEMI) myocardial infarction involving right coronary artery: Secondary | ICD-10-CM | POA: Insufficient documentation

## 2018-08-16 DIAGNOSIS — F411 Generalized anxiety disorder: Secondary | ICD-10-CM

## 2018-08-16 DIAGNOSIS — M3320 Polymyositis, organ involvement unspecified: Secondary | ICD-10-CM

## 2018-08-16 LAB — TSH: TSH: 0.9 u[IU]/mL (ref 0.35–4.50)

## 2018-08-16 LAB — BASIC METABOLIC PANEL
BUN: 26 mg/dL — ABNORMAL HIGH (ref 6–23)
CALCIUM: 10 mg/dL (ref 8.4–10.5)
CO2: 25 meq/L (ref 19–32)
Chloride: 104 mEq/L (ref 96–112)
Creatinine, Ser: 0.93 mg/dL (ref 0.40–1.20)
GFR: 78.11 mL/min (ref >60.00)
Glucose, Bld: 123 mg/dL — ABNORMAL HIGH (ref 70–99)
Potassium: 3.9 mEq/L (ref 3.5–5.1)
SODIUM: 141 meq/L (ref 135–145)

## 2018-08-16 LAB — CBC WITH DIFFERENTIAL/PLATELET
Basophils Absolute: 0 10*3/uL (ref 0.0–0.1)
Basophils Relative: 0.2 % (ref 0.0–3.0)
Eosinophils Absolute: 0 10*3/uL (ref 0.0–0.7)
Eosinophils Relative: 0 % (ref 0.0–5.0)
HCT: 43.6 % (ref 36.0–46.0)
HEMOGLOBIN: 14.6 g/dL (ref 12.0–15.0)
LYMPHS ABS: 1.9 10*3/uL (ref 0.7–4.0)
Lymphocytes Relative: 17.3 % (ref 12.0–46.0)
MCHC: 33.5 g/dL (ref 30.0–36.0)
MCV: 90.7 fl (ref 78.0–100.0)
MONOS PCT: 6 % (ref 3.0–12.0)
Monocytes Absolute: 0.7 10*3/uL (ref 0.1–1.0)
NEUTROS PCT: 76.5 % (ref 43.0–77.0)
Neutro Abs: 8.4 10*3/uL — ABNORMAL HIGH (ref 1.4–7.7)
Platelets: 434 10*3/uL — ABNORMAL HIGH (ref 150.0–400.0)
RBC: 4.8 Mil/uL (ref 3.87–5.11)
RDW: 13.7 % (ref 11.5–15.5)
WBC: 11 10*3/uL — AB (ref 4.0–10.5)

## 2018-08-16 LAB — SEDIMENTATION RATE: SED RATE: 35 mm/h — AB (ref 0–30)

## 2018-08-16 LAB — BRAIN NATRIURETIC PEPTIDE: PRO B NATRI PEPTIDE: 36 pg/mL (ref 0.0–100.0)

## 2018-08-16 MED ORDER — DOXYCYCLINE HYCLATE 100 MG PO TABS: 100.0000 mg | ORAL_TABLET | Freq: Two times a day (BID) | ORAL | 0 refills | Status: DC

## 2018-08-16 MED ORDER — PREDNISONE 10 MG PO TABS: ORAL_TABLET | ORAL | 0 refills | Status: DC

## 2018-08-16 NOTE — Patient Instructions (Signed)
Today we updated your med list in our EPIC system...    Continue your current medications the same...  Continue the AIRDUO 232- 2 sprays twice daily & the PROAIR-HFA 1-2 sprays every 6H as needed for wheezing...  Part of the problem with your sore/tender chest wall and the shortness of breath is from spasm in the chest wall muscles (making them tight, sore, & tender)...    I recommend resting the chest wall (rest at home, no heavy lifting or strenuous activity)...    Apply HEAT- heating pad, hot soaks, Myoflex cream etc...    And take the chest wall muscle relaxer (on your med list it's the ALPRAZOLAM 0,5mg  tabs) taking 1/2 tab in AM, 1/2 tab in afternoon, and 1 tab at bedtime...  For any severe pain/ discomfort-- use the TRAMADOL 50mg  - one tab every 6H as needed for pain (& take it with extra strength Tylenol to boost it's effect)...   We are checking a Spirometry breathing test, an ambulatory oximetry test, and a follow up CXR & blood work...    We will contact you w/ the results when available...   Finally- since the [place] initially seemed to help somewhat-- we will follow up w/ a round of DOXYCYCLINE 100mg  twice daily til gone, and PREDNISONE 10mg  tabs as follows:  Start with one tab twice daily for 4 days...  Then decrease to 1 tab each AM for 4 days...    Then decrease to 1/2 tab daily for 4 days...  Then decrease to 1/2 tab every other day til gone (1/2, 0, 1/2, 0, etc).  Call for any questions...  Let's plan a follow up visit in 3 weeks, sooner if needed for problems.Marland KitchenMarland Kitchen

## 2018-08-16 NOTE — Progress Notes (Addendum)
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 CardizemCD240, Losar100, Lasix20prn;  BP= 126/74, no recurrent CP, no palpit, syncope, edema; followed by [name] & last seen 07/08/17- post hosp check & no change in meds, rec to continue aggressive secondary prevention...    VI & chr stasis changes> on low salt diet, Lasix20 prn, compression hose; she was seen in the [facility] wound clinic yrs ago by DrEaton Corporation DrSevier...    Chol>  Followed in the [place] statin intol, prev in the ACCELERATE lipid lowering study in 2015, CPK elev & she was taken off the drug, she's also had myalgias from Zetia; currently on REPATHA shots Q14d;  LAB f/u FLP on Repatha 08/18/16 showed TChol 191, TG 214, HDL 41, LDL 107...    DM, Obesity>  Followed by [name], note 07/21/17 reviewed, [place] to [place]-1000 one daily;  A1c= 6.8    GI- GERD, IBS>  On Protonix40, Zofran4 prn; prev followed by [name]- she had EGD & Colon 7/15; presented c/o ext hem- Rx w/ sitz, baby wipes, benefiberAnusolHC...    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 We will call for updated notes from  him... She is also c/o LBP & wants TRAMADOL refilled...     Anxiety>  On Xanax 0,32m532m/2-1 tab Tid...    VitB12 & VitD defic>  On B12 1000m11mM Qmo per [name] &  B12 level done 8/14 = 154 w/ f/u B12 level >1500 on the shots;  she tells me that [name] treated her low VitD w/ 50K weekly, then switched to daily OTC supplement, we do not have notes from DrKeAkron   Borderline anemia & low Ferritin>  she was treated w/ Fe from [name] & improved... EXAM shows Afeb, VSS, O2sat=99% on RA; Wt=173#, 5'2"Tall, BMI=32; Heent-  neg, mallampati2; Chest- decr BS & few bibasilar rhonchi w/o w/r; Heart- RR gr1/6 SEM w/o r/g; Abd- soft, neg; Ext- VI, tr edema; Neuro- mild wkness. IMP/PLAN>>  We refilled XANAX 0.77m- take 1/2 tab bid & 1Qhs #60/mo;  We refilled PROAIR-HFA rescue inhaler, and rec trial AIRDUO 232-14 2spBid ($50 cash price from [employer];  We also refilled her TRAMADOL per request;  She will maintain f/u w/ DrBeekman, [name], [name], and her Gyn; she will seek a new PCP as I am going to retire later this yr...    ~  May 30, 2018:  354moOV & Keashia returns stating that her breathing is the same- still feels SOB, can't get a deep breath, tight, can't get the air "IN";  She has a hx asthma/ AB/ inactive sarcoid - on [id number] + ProairHFA rescue as needed;  She has multisystem disease (see above prob list) and followed by [name] for CARDS- HBP/ CAD- s/p IWMI w/ thrombectomy & stent, HL- on ASA81, Brillinta, Metoprolol, CardizemCD, Repatha;  DrBeekman for RHEUM- polymyositis/ myopathy/ DJD/ DDD- on Acthar injections;  [name] for ENDOCRINE- DM2/ left adrenal adenoma/ VitB12 & VitD defic- on SynjardyXR, VitD, B12 shots;  She has also seen DrWolicki for hearing loss & tinnitus...  We reviewed the following medical problems during today's office visit>      AR/ AB/ Hx Sarcoid>  On [id number] & Albut rescue inhaler prn & Flonase; we discussed Rx w/ MUCINEX 60057m1-2Bid w/ fluids...     HBP/ CAD-  s/p IWMI 8/18 w/ cath thrombectomy & drug eluting stent in RCA>  Now on ASA81, Brillinta90Bid, Metop50Bid, CardizemCD240, Losar100, Lasix20prn;  BP= 134/76, no recurrent CP, no palpit, syncope, edema; followed by [name] & last seen 01/2018- no change in meds, rec to continue aggressive secondary prevention...    VI & chr stasis changes> on low salt diet, Lasix20 prn, compression hose; she was seen in the [facility] yrs ago by DrNEaton CorporationDrSevier...    Chol>  [place] stat[place] intol, prev in the ACCELERATE lipid lowering study in 2015, CPK elev & she was taken off the drug, she's also had myalgias from Zetia; currently on REPATHA shots Q14d;  LAB f/u FLP on Repatha 05/30/18 showed TChol 202, TG 181, HDL 56, LDL 109...    DM, Obesity>  Followed by [name], note 11/2017 reviewed, [facility] one daily;  A1c= 6.8    GI- GERD, IBS>  On Protonix40, Zofran4 prn; prev followed by [name]- she had EGD & Colon 7/15; presented c/o ext hem- Rx w/ sitz, baby wipes, benefiberAnusolHC...    Rheum- Polymyositis, hx FM>  Dx & followed by DrBMarylouise Stacks ACTHAR MWF; last note 02/2018 reviewed, weakness & musc pain said to be better on the Acthar; MTX & Folic was also started, later DC'd... She is also c/o LBP & wants TRAMADOL refilled...     Anxiety>  On Xanax 0,5mg70m2-1 tab Tid...    VitB12 & VitD defic>  On B12 1000mc35m Qmo per [name] &  B12 level done 8/14 = 154 w/ f/u B12 level >1500 on the shots;  she tells me that [name] treated her low VitD w/ 50K weekly, then switched to daily OTC supplement, we do not have notes from DrKerArpin  Borderline anemia & low Ferritin>  she was treated w/ Fe from [name] & improved... EXAM shows Afeb, VSS, O2sat=95% on RA; Wt=175#, 5'2"Tall, BMI=32; Heent- neg,  mallampati2; Chest- decr BS & few bibasilar rhonchi w/o w/r; Heart- RR gr1/6 SEM w/o r/g; Abd- soft, neg; Ext- VI, tr edema; Neuro- mild wkness.  LABS  05/30/18>  FLP- ok on Repatha shots per CARDS;  Chems- ok w/ K=3.6, Cr=0.81, BS=118, LFTs wnl;  CBC- wnl w/ Hg=13.2;  TSH=0.41 IMP/PLAN>>  Ila is stable but persists w/ mult somatic complaints attributed to mult organ systems- she is advised to continue her current meds and maintain f/u w/ her numerous medical specialists...   NOTE:  >50% of this 53mn ROV was spent in counseling & coordination of care...          Problems List:  MENIERE'S DISEASE (ICD-3 - eval by ENT, DrBates- Rx w/ diuretic, low salt, Xanax vs Valium, & Antivert Prn... dizz[facility] recurred [facility] we don't have notes but pt reports that nothing was found.  ALLERGIC RHINITIS - increased allergy symptoms in the spring... rec> Claritin, Saline, Flonase...  Hx of ASTHMATIC BRONCHITIS, ACUTE  - requires occas Antibiotics and Medrol for infectious exac- last Oct09 & resolved w/ Avelox/ Depo/ Medrol... has not been on regular inhalers, but uses XOPENEX MDI (w/ spacer), MSimpsonvillePrn... ~  essentially neg CTChest 9/08 after CXR suggested RULnodule. ~  CXR 10/09 showed cardiomegaly, clear lungs, min scoliosis, NAD. ~  CXR 9/11 showed cardiomeg, clear lungs, NAD..[name] ~  CXR 1/14 showed cardiomeg, clear lungs, no adenopathy, mild scoliosis... ~  CXR 4/15 showed mild cardiomeg, clear lungs, NAD... ~  PFT 10/15 showed FVC=1.93 (79%), FEV1=1.55 (79%), %1sec=80%, mid-flows=70% predicted...  ~  CXR showed mild cardiomeg & sl tortuous Ao, clear lungs, DDD of Tspine, NAD... ~  10/16:  We wrote for an AlbutHFA inhaler for prn use at her request for wheezing... ~  12/16: presents w/ refractory AB- given Levaquin, already on Pred per Rheum, added Advair100Bid + Mucinex600-2Bid... ~  02/02/16: another bout of refractory AB- treated w/ Depo, Medrol taper, Augmentin, Mucinex, Proventil...  Hx of SARCOIDOSIS  - Dx'd 1980's w/ bronch bx... Rx Pred transiently and improved... no active disease x years... ~  baseline CXRs  showed cardiomegaly, clear lungs, min scoliosis, NAD.  Hx of Mild OBSTRUCTIVE SLEEP APNEA - sleep eval DrClance 2006 w/ RDI= 5 only.   [facility] >>   HYPERTENSION, BENIGN  >> followeed by [name] now for CARDS...  ~  on TOPROL XL 517m 1.5 tabs daily, CARDIZEM 24038m, LASIX 64m7mst as needed now... ~  prev OV's 130-140's / 80's... prev noted sl HA, chest tightness, sl SOB, & mult somatic complaints... Labs & renal- all WNL. ~  4/12:  BP=122/76 today, and similar at home per pt... She denies CP, palpit, SOB, edema, etc... Labs & renal- all WNL. ~  1/14: on ASA81, Imdur120, ToprolXL75, Cardizem240, Lasix20prn (seldom takes); BP=128/80 & she has mult somatic c/o- CWP, tired/no energy, but denies palpit, ch in SOB, edema, etc; followed by EaglSadie Haberds- DrTurner/ [name] & seen recently but we don't have note; they prev added RANEXA 500mg58m for her CP (now off this med); we discussed trial Lunesta 2mgQh58mor sleep & Tramadol 50mgTi19mr pain... ~  9/14:  on ASA81, Imdur120, Toprol50-1/2Bid, Cardizem240, Lasix20prn (seldom takes); BP=132/74 & she has mult somatic c/o- CWP, tired/no energy, but denies palpit, ch in SOB, edema, etc; [facility] [name], no recent notes ~  4/15: on ASA81, Toprol50-1/2Bid, Cardizem240, Lasix20prn (ave 1/d) & ?Amlodip5 from Kerr?; [place]24/76 & she  has mult somatic c/o- CWP, tired/no energy, but denies palpit, ch in SOB, edema, etc; followed by Sadie Haber cards- [name], no recent notes. ~  4/16: on ASA81, Metop25Bid, CardizemCD240, Losar100, Lasix40; BP= 146/88 and reminded to elim sodium & get weight down...  CAD- s/p IWMI 8/18 w/ thrombectomy/stent by [name] - prev followed by DrTTurner... ~  Cath in 2000 showed non-obstructive CAD (20-30% lesions in all 3 vessels) w/ rec for risk factor modification.   ~  NuclearStressTest 4/07 showed no ischemia & EF=73%. ~  Mar10:  she's concerned about BP and chest  tightness, requests Cardiac eval DrTurner & we will refer... ~  2DEcho 4/10 showed norm LVF w/ EF=55-60%, norm MV, norm AoV, Gr1DD ~  4/10 eval by DrTurner w/ MYOVIEW- neg- breast attenuation, no regional wall motion abn, EF= 75% ~  4/10 Cath by DrTurner w/ 90% small 2nd diag branch of LAD <too sm for PTCA> & luminal irreg up to 20% in RCA, +DD- Med Rx. ~  She had more chest tightness & another Myoview from DrTurner 8/12- it too was neg, no ischemia, no infarct; +breast attenuation; EF=70% & nno regional wall motion abnormalities;  IMDUR has been incr to 157m/d. ~  1/14: [facility] have [facility]> EKG sent showed NSR, rate72, wnl, NAD... ~  EKG 5/14 showed NSR, rate 93, WNL, NAD... ~  12/14: she saw [name] for Cards f/u (note reviewed)> HBP, CAD, Chol; off Imdur w/o angina, continue same meds including both Cardizem & Amlodipine, encouraged to continue [employer] study... ~  She continues regular f/u visits w/ [name], CARDS> his notes are reviewed... ~  08/22/17>  Now on ASA81, Brillinta90Bid, Metop50Bid, CardizemCD240, Losar100, Lasix20prn + Repatha; followed by DAhtanum- [facility] DrWWoods and seen by DrSevier 6/08= ven insuffic Rx w/ TED's, no salt, elevation, etc. ~  10/10: notes bilat ankle ?lipoma in front of the lat malleoli- asymptomatic but several people have noticed them and she request refer to Ortho (rec DrBednarz when she is ready).  HYPERCHOLESTEROLEMIA  - on LIPITOR 49md & ZETIA 1046m + CoQ10 per DrTurner... Diet & exercise stressed to the pt. ~  FLP here 12/07 on Lip10 showed TChol 156, TG 94, HDL 51, LDL 86 ~  FLP 1/09 on Lip10 showed TChol 157, TG 122, HDL 49, LDL 84 ~  [facility] on Lip10 showed TChol 176, TG 116, HDL 55, LDL 98 ~  FLP 3/10 on Lip10 showed TChol 170, TG 94, HDL 59, LDL 93 ~  FLP 6/10 by DrTurner on Lip20 showed TChol 134,  TG 82, HDL 55, LDL 73 ~  FLP 4/11 here on Lip20+Zetia? showed TChol 145, TG 74, HDL 68, LDL 63 ~  9/11:  she reports that DrTurner incr Lipitor to 48m62mlus the Zetia 10mg71md she does her FLPs. ~  FLP here 4/12 on Lip40+Zetia showed TChol 153, TG 54, HDL 53, LDL 90... Copy sent to DrTurner ~  FLP bWellsrKerr 7/13 on Lip40+Zetia10 showed TChol 137, TG 89, HDL 42, LDL 73 ~  9/14: on Lip40 => notes from [facility] [place] ?Lip80 +PharmQuest study drug?; she has gained 4# to 190# today; [facility] & she reports concern for "particle size" & last avail FLP was 7/14 by [name] w/ TChol 190, HDL 100, LDL 40 ~  FLP followed by [name] for  CARDS, and [name] for Endocrine... ~  Now on REPATHA shots Q14d...   FOLLOWED by [name] FOR ENDOCRINOLOGY >>   DIABETES MELLITUS  - on Metformin 537mBid + PARLODEL 2.546md per DrEllison... She is intol to Actos w/ edema, she stopped Januvia due to ?side effect, she wondered about Tajenta since her daugh works for [employer]~  labs 9/08 showed FBS=119, HgA1c=6.6 ~  [facility]= 108, HgA1c= 6.5 ~  labs 3/10 showed BS= 113, A1c= 6.6 ~  labs 6/10 by DrTurner showed BS= 92; and 3/11 BS= 127 ~  labs 4/11 showed BS= 124, A1c= 7.6...Marland Kitchenrec> incr Metform to Bid; she [facility]... ~  6/11:  pt requested Endocrine consult & seen by DrEllison w/ Januvia 10082m added. ~  labs 9/11 showed BS= 98, A1c= 7.3... Marland KitchenMarland Kitchene stopped Januvia due to side effects & wonders about using Tajenta instead. ~  DrEllison started Bro[employer]5mg15m.. She is now taking this +Metform 500mg73m.. ~  Labs 4/12 (wt=195#) showed Bs= 116, A1c= 7.1 ~  Labs 7/12 showed A1c= 7.2;  Umicroalb= 3.5 ~  Labs 10/12 (wt=186#) showed A1c= 6.7 NOTE: She participated in a [facility] trial called the AfricanAmerican- Diabetes Heart Study in 2012; they sent a report indicating: 1) her memory was fine, but her MRI Brain showed sm vessel dis & mild sinus dis,  otherw neg; 2) she may have treatable depression but we tried her on Zoloft & she was intol==> ch to Lexapro. ~  followed by [name] on Metform500Bid & Onglyza2.5; labs [place]Ker[place] showed BS=201, A1c=6.0; he rec same meds... ~  9/14: followed by [name] on Metform500Bid & Onglyza2.5 => ?he changed to Kombiglyze5-1000 daily?; last labs from DrKerStrathcona showed BS=105, A1c=6.5; and she remains on the same meds... ~  1/15: she had f/u [name] for Endocrine> DM, Obesity, Adrenal adenoma, VitB12 defic; labs reviewed, note reviewed- he tried Belviq but she was intol... ~  She continues regular follow up w/ [name]... ~  Sw[facility] to [facility] Qam after her IWMI 8/18... A1c=6.8   FOLLOWED by [name] et al for GASTORENTEROLOGY >>   GERD SYMPTOMS - pt placed on PROTONIX 40mg/27m improvement in reflux symptoms...    ADDENDUM>> note from [name] indicates> upper endoscopy in July 1994 showed antral gastritis. 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  IRRITABLE BOWEL SYNDROME  - colonoscopy 7/03 by [name] was WNL. ~  9/14: she was referred to GI for f/u colon but never did it... ~  4/15: we will refer her to GI again...  RENAL CALCULUS  - sm stone seen in L kidney on CTScan... [facility]  POLYMYOSITIS >> now followed by DrBeekman FIBROMYALGIA  - prev Rx by [name] in 2006... ~  1/14: This is her CC w/ not resting well, wakes tired, poor energy, aching/ sore/ tender (esp in chest in AM) & we discussed trial Lunesta 2mgQhs10m Tramadol 50mgTid86mus rest, heat, etc... States she's INTOL Ambien w/ weird dreams & Benedryl/ Melatonin w/o help... ~  11/14: she went to the ER w/ LBP> felt to be a lumbar strain... 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 supplemented w/ OTC Vit D 1000 u daily. ~  Vit D level was lower from [name] & he treated w/ Vit D 50K weekly for awhile, then switched to OTC supplement...  ANXIETY - on ALPRAZOLAM 0.30m Prn... DEPRESSION - prev on Lexapro 238md but she stopped on her own ~10/13 ?cost issues? ~  4/12: c/o feeling sad & anxious all the time- weak, not resting well, under a lot of stress; rec to seek counselling thru her local church or let usKoreaefer to [facility]& start RX w/ Zoloft 50==> but she was INTOL... ~  2013: switched to Lexapro & seems to be doing better but didn't stick w/ it due to cost issues...  ANEMIA & LOW FERRITIN LEVEL >>  B12 DEFICIENCY >> treated by [name] w/ B12 shots ~  9/14: she tells me that Ferritin is low & [name] tried her on Oral Fe prep w/o improvement & wants me to f/u and treat this problem; we discussed need to recheck labs here- LABS 9/14 showed Hg=12.5, MCV=88, Fe=47 (12%sat), Ferritin=10.8; she is due for f/u colon w/ [name] & we will refer, rec to start FeSO4 32559mid w/ VitC500 w/ plans for f/u CBC, Fe studies in 2-8mo48mo 4/15:  Labs showed Hg= 12.5 but Fe=31 (8%); she was only taking the Fe once daily 7 advised to incr to Bid w/ vitC500; needs f/u colon & we will refer to [name] again...  HEALTH MAINTENANCE: ~  GI:  Followed by [name] & colon due 7/13... ~  GYN:  Followed by DrDillard & she gets yearly PAP, Mammogram, hasn't had baseline BMD yet, [facility] on [facility]... ~  Immunizations:  She gets the  yearly Flu shots at school;  Had PNEUMOVAX previously; TDAP given 4/12...   Past Surgical History:  Procedure Laterality Date  . CORONARY STENT INTERVENTION N/A 06/15/2017   Procedure: CORONARY STENT INTERVENTION;  Surgeon: VaraJettie Booze;  [facility];  Service: Cardiovascular;  Laterality: N/A;  . heart catherization    . LEFT HEART CATH AND CORONARY ANGIOGRAPHY N/A 06/15/2017   Procedure: LEFT HEART CATH AND CORONARY ANGIOGRAPHY;  Surgeon: VaraJettie Booze;  [facility];  Service: Cardiovascular;  Laterality: N/A;  . TONSILLECTOMY    No other past surgical history on file - other than Lithotripsy for renal stone...   Outpatient Encounter Medications as of 05/30/2018  Medication Sig  . albuterol (PROAIR HFA) 108 (90 Base) MCG/ACT inhaler Inhale 1-2 puffs into the lungs every 6 (six) hours as needed for wheezing or shortness of breath.  . ALPRAZolam (XANAX) 0.5 MG tablet 1/2 morning, 1/2 in afternoon, and 1 at bedtime  . aspirin 81 MG chewable tablet Chew 1 tablet (81 mg total) by mouth daily.  . Cyanocobalamin 1000 MCG/ML KIT Inject 1,000 mg as directed every 30 (thirty) days.  . Empagliflozin-metFORMIN HCl ER (SYNJARDY XR) 25-1000 MG TB24 Take 1 tablet by mouth daily.  . Fluticasone-Salmeterol (AIRDUO RESPICLICK 232/784/692-629-52/ACT AEPB Inhale 2 puffs into the lungs 2 (two) times daily.  . furosemide (  LASIX) 20 MG tablet Take 20 mg by mouth daily as needed for fluid or edema.  [name] HP ACTHAR 80 UNIT/ML injectable gel Inject 80 Units into the skin 3 (three) times a week.   . metoprolol tartrate (LOPRESSOR) 50 MG tablet TAKE 1 TABLET BY MOUTH TWICE A DAY  . mometasone-formoterol (DULERA) 100-5 MCG/ACT AERO Inhale 2 puffs into the lungs 2 (two) times daily.  . nitroGLYCERIN (NITROSTAT) 0.4 MG SL tablet Place 1 tablet (0.4 mg total) under the tongue every 5 (five) minutes as needed for chest pain.  [name] ondansetron (ZOFRAN-ODT) 4 MG disintegrating  tablet Take 4 mg by mouth every 8 (eight) hours as needed for nausea or vomiting.  Jocelyn [name] VERIO test strip   . pantoprazole (PROTONIX) 40 MG tablet take 1 tablet by mouth once daily  . REPATHA SURECLICK 841 MG/ML SOAJ INJECT 1 PEN INTO THE SKIN EVERY 14 (FOURTEEN) DAYS. REFRIGERATE.  [name] traMADol (ULTRAM) 50 MG tablet Take 1 tablet (50 mg total) by mouth 3 (three) times daily as needed.  . valACYclovir (VALTREX) 500 MG tablet Take 500 mg by mouth daily.   . [DISCONTINUED] ALPRAZolam (XANAX) 0.5 MG tablet Take 1/2 tablet by mouth in morning,1/2 tab in afternoon, 1/2 tab  at bedtime  . [DISCONTINUED] ALPRAZolam (XANAX) 0.5 MG tablet Take 0.5 mg by mouth. 1/2 morning, 1/2 in afternoon, and 1 at bedtime  . [DISCONTINUED] diltiazem (CARTIA XT) 240 MG 24 hr capsule Take 1 capsule (240 mg total) by mouth daily.  . [DISCONTINUED] losartan (COZAAR) 100 MG tablet take 1 tablet by mouth once daily  . [DISCONTINUED] ticagrelor (BRILINTA) 90 MG TABS tablet Take 1 tablet (90 mg total) by mouth 2 (two) times daily.  . fluticasone (FLONASE) 50 MCG/ACT nasal spray Place 2 sprays into both nostrils at bedtime.  . [DISCONTINUED] ALPRAZolam (XANAX) 0.5 MG tablet 1/2 tab in morning,1/2 tab in afternoon, 1/2 tab at bedtime (Patient taking differently: 0.5 mg. 1/2 tab in morning,1/2 tab in afternoon, 1 tab at bedtime)   No facility-administered encounter medications on file as of 05/30/2018.     Allergies  Allergen Reactions  . Actos [Pioglitazone] Other (See Comments)    REACTION: pt states INTOL w/ edema  . Codeine Other (See Comments)    REACTION: vomiting  . Januvia [Sitagliptin] Nausea Only  . Lactose Intolerance (Gi) Diarrhea  . Morphine Nausea Only and Nausea And Vomiting    REACTION: vomiting REACTION: vomiting    Immunization History  Administered Date(s) Administered  . Influenza Split 08/16/2011, 08/16/2012, 08/16/2013, 09/04/2014  . Influenza Whole 09/16/2009, 08/15/2010  . Influenza,inj,Quad  PF,6+ Mos 08/30/2016, 06/16/2017  . Influenza-Unspecified 08/19/2015  . Pneumococcal Polysaccharide-23 11/15/2004  . Tdap 02/22/2011    Current Medications, Allergies, Past Medical History, Past Surgical History, Family History, and Social History were reviewed in [employer] record.   Review of Systems        See HPI - all other systems neg except as noted...      The patient complains of weight gain, dyspnea on exertion, and peripheral edema.  The patient denies anorexia, fever, weight loss, vision loss, decreased hearing, hoarseness, chest pain, syncope, prolonged cough, headaches, hemoptysis, abdominal pain, melena, hematochezia, severe indigestion/heartburn, hematuria, incontinence, genital sores, suspicious skin lesions, transient blindness, difficulty walking, depression, unusual weight change, abnormal bleeding, enlarged lymph nodes, and angioedema.     Objective:   Physical Exam     WD, Overweight, 62 y/o BF in NAD... GENERAL:  Alert & oriented;  pleasant & cooperative. HEENT:  [place]/AT, EOM-wnl, PERRLA, EACs-clear, TMs-wnl, NOSE-clear, THROAT-clear & wnl. NECK:  Supple w/ fairROM; no JVD; normal carotid impulses w/o bruits; no thyromegaly or nodules palpated; no lymphadenopathy. CHEST:  decrBS at bases, few scar rhonchi & end-exp wheezing, no rales, no consolidation... HEART:  Regular Rhythm; without murmurs/ rubs/ or gallops. ABDOMEN:  Soft & nontender; normal bowel sounds; no organomegaly or masses detected. EXT: without deformities, mild arthritic changes; no varicose veins but +venous insuffic & tr edema; +trigger points. NEURO:  CN's intact;  no focal neuro deficits... DERM:  No lesions noted; no rash etc...  RADIOLOGY DATA:  Reviewed in the EPIC EMR & discussed w/ the patient...  LABORATORY DATA:  Reviewed in the EPIC EMR & discussed w/ the patient...   Assessment & Plan:    RESP>  Hx AR, HxAsthma, old sarcoid, mild OSA>  All stable, breathing  OK, notes some wheezing w/ activity; exam reveals some secretions in the airway & rec to take MUCINEX 1266m bid, Fluids, etc;. CXR remains stable/ NAD... 10/20/15>  Presented w/ refractory AB- already on Pred- given Depo80, adding Levaquin500 x7d, ADVAIR100Bid, Mucinex600-2Bid + fluids... 02/02/16>  Presents w/ another refractory AB- currently off Pred & MTX, on Imuran; treated w/ Depo, Medrol taper, Augmentin, Mucinex, Albut=HFA...  04/2016>  Back to baseline 11/04/16>  She has URI & we wrote for ZPak => subseq Augmentin, Saline, flonase, Mucinex... 05/05/17>   DYohanais improved s/p Augmentin & Pred Rx for her URI & AB exac;  She remains on Symbicort80-2spBid + Mucinex600- 1to2Bid w/ extra fluids;  I reminded her to be sure to requests notes sent [place] [facility] they are not on Epic;  She will maintain f/u w/ these specialists + [name] for CARDS;  We plan recheck in 3-4361mo/8/19>   We refilled XANAX 0.61m46mtake 1/2 tab bid & 1Qhs #60/mo;  We refilled PROAIR-HFA rescue inhaler, and rec trial AIRDUO 232-14 2spBid ($50 cash price from [employer] We also refilled her TRAMADOL per request;  She will maintain f/u w/ DrBeekman, [name], [name], and her GynConcha [place]he will seek a new PCP as I am going to retire later this yr. 05/30/18>   DenVirgen stable but persists w/ mult somatic complaints attributed to mult organ systems- she is advised to continue her current meds and maintain f/u w/ her numerous medical specialists...    CARDS>  Followed by DrTTurner/ VarRica Moten meds listed? SHE DID NOT BRING MEDS TO THE OV> Hx HBP, CAD, VI, etc;  Chest pain is felt to be non-cardiac & likely related to her FM; Doing satis & we reviewed exercise program etc...  CHOL>  [facility]- intol Lip + Zetia; she took part in a study drug from [facility] => [facility]  DM>  Followed by [name] on Metformin, Onglyzal; A1c improved to 6.7 in 2012, then 6.0 in  2013, & 6.5  In 2014;  rec continue meds & asked to have records sent to us.[name]  GI>  Stable on Protonix as needed; see GI eval from [name] 7/15...  POLYMYOSITIS/ FM>  Diagnoses w/ Polymyositis recently (2016) & started on Pred, MTX, folate by DrBeekman=> switched to [employer] insurance doesn't want to pay, she is reminded to have records sent to us.[name]  Anxiety>  She remains on Alprazolam as needed; & she stopped the Lexapro..  Anemia, Low Ferritin> on FeSO4 [id number] 500...   B12  Defic> on B12 shots per [name]...  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); due to this stress and her mult medical issues including polymyositis (DrBeekman), DM2 ([name]), heart problems ([name])- she tells me she is taking a LOA for the rest of this school yr & planning on retirement...   Patient's Medications  New Prescriptions   AZITHROMYCIN (ZITHROMAX) 250 MG TABLET    Take 2 pills today then one a day for 4 additional days   METHYLPREDNISOLONE (MEDROL DOSEPAK) 4 MG TBPK TABLET    follow package directions  Previous Medications   ALBUTEROL (PROAIR HFA) 108 (90 BASE) MCG/ACT INHALER    Inhale 1-2 puffs into the lungs every 6 (six) hours as needed for wheezing or shortness of breath.   ASPIRIN 81 MG CHEWABLE TABLET    Chew 1 tablet (81 mg total) by mouth daily.   CYANOCOBALAMIN 1000 MCG/ML KIT    Inject 1,000 mg as directed every 30 (thirty) days.   EMPAGLIFLOZIN-METFORMIN HCL ER (SYNJARDY XR) 25-1000 MG TB24    Take 1 tablet by mouth daily.   FLUTICASONE (FLONASE) 50 MCG/ACT NASAL SPRAY    Place 2 sprays into both nostrils at bedtime.   FLUTICASONE-SALMETEROL (AIRDUO RESPICLICK 793/90) 300-92 MCG/ACT AEPB    Inhale 2 puffs into the lungs 2 (two) times daily.   FUROSEMIDE (LASIX) 20 MG TABLET    Take 20 mg by mouth daily as needed for fluid or edema.   HP ACTHAR 80 UNIT/ML INJECTABLE GEL    Inject 80 Units into the skin 3 (three)  times a week.    METOPROLOL TARTRATE (LOPRESSOR) 50 MG TABLET    TAKE 1 TABLET BY MOUTH TWICE A DAY   MOMETASONE-FORMOTEROL (DULERA) 100-5 MCG/ACT AERO    Inhale 2 puffs into the lungs 2 (two) times daily.   NITROGLYCERIN (NITROSTAT) 0.4 MG SL TABLET    Place 1 tablet (0.4 mg total) under the tongue every 5 (five) minutes as needed for chest pain.   ONDANSETRON (ZOFRAN-ODT) 4 MG DISINTEGRATING TABLET    Take 4 mg by mouth every 8 (eight) hours as needed for nausea or vomiting.   ONETOUCH VERIO TEST STRIP       PANTOPRAZOLE (PROTONIX) 40 MG TABLET    take 1 tablet by mouth once daily   REPATHA SURECLICK 330 MG/ML SOAJ    INJECT 1 PEN INTO THE SKIN EVERY 14 (FOURTEEN) DAYS. REFRIGERATE.   TRAMADOL (ULTRAM) 50 MG TABLET    Take 1 tablet (50 mg total) by mouth 3 (three) times daily as needed.   VALACYCLOVIR (VALTREX) 500 MG TABLET    Take 500 mg by mouth daily.   Modified Medications   Modified Medication Previous Medication   ALPRAZOLAM (XANAX) 0.5 MG TABLET ALPRAZolam (XANAX) 0.5 MG tablet      1/2 morning, 1/2 in afternoon, and 1 at bedtime    Take 0.5 mg by mouth. 1/2 morning, 1/2 in afternoon, and 1 at bedtime   BRILINTA 90 MG TABS TABLET ticagrelor (BRILINTA) 90 MG TABS tablet      TAKE 1 TABLET BY MOUTH TWICE A DAY    Take 1 tablet (90 mg total) by mouth 2 (two) times daily.   CARTIA XT 240 MG 24 HR CAPSULE diltiazem (CARTIA XT) 240 MG 24 hr capsule      TAKE 1 CAPSULE(240 MG) BY MOUTH DAILY    Take 1 capsule (240 mg total) by mouth daily.   LOSARTAN (COZAAR)  100 MG TABLET losartan (COZAAR) 100 MG tablet      TAKE 1 TABLET BY MOUTH ONCE DAILY    take 1 tablet by mouth once daily  Discontinued Medications   ALPRAZOLAM (XANAX) 0.5 MG TABLET    1/2 tab in morning,1/2 tab in afternoon, 1/2 tab at bedtime   ALPRAZOLAM (XANAX) 0.5 MG TABLET    Take 1/2 tablet by mouth in morning,1/2 tab in afternoon, 1/2 tab  at bedtime

## 2018-08-16 NOTE — Progress Notes (Addendum)
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 CardizemCD240, Losar100, Lasix20prn;  BP= 126/74, no recurrent CP, no palpit, syncope, edema; followed by [name] & last seen 07/08/17- post hosp check & no change in meds, rec to continue aggressive secondary prevention...    VI & chr stasis changes> on low salt diet, Lasix20 prn, compression hose; she was seen in the [facility] wound clinic yrs ago by DrEaton Corporation DrSevier...    Chol>  Followed in the [place] statin intol, prev in the ACCELERATE lipid lowering study in 2015, CPK elev & she was taken off the drug, she's also had myalgias from Zetia; currently on REPATHA shots Q14d;  LAB f/u FLP on Repatha 08/18/16 showed TChol 191, TG 214, HDL 41, LDL 107...    DM, Obesity>  Followed by [name], note 07/21/17 reviewed, [place]1000 one daily;  A1c= 6.8    GI- GERD, IBS>  On Protonix40, Zofran4 prn; prev followed by [name]- she had EGD & Colon 7/15; presented c/o ext hem- Rx w/ sitz, baby wipes, benefiberAnusolHC...    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 We will call for updated notes from  him... She is also c/o LBP & wants TRAMADOL refilled...     Anxiety>  On Xanax 0,32m532m/2-1 tab Tid...    VitB12 & VitD defic>  On B12 1000m11mM Qmo per [name] &  B12 level done 8/14 = 154 w/ f/u B12 level >1500 on the shots;  she tells me that [name] treated her low VitD w/ 50K weekly, then switched to daily OTC supplement, we do not have notes from DrKeAkron   Borderline anemia & low Ferritin>  she was treated w/ Fe from [name] & improved... EXAM shows Afeb, VSS, O2sat=99% on RA; Wt=173#, 5'2"Tall, BMI=32; Heent-  neg, mallampati2; Chest- decr BS & few bibasilar rhonchi w/o w/r; Heart- RR gr1/6 SEM w/o r/g; Abd- soft, neg; Ext- VI, tr edema; Neuro- mild wkness. IMP/PLAN>>  We refilled XANAX 0.7m- take 1/2 tab bid & 1Qhs #60/mo;  We refilled PROAIR-HFA rescue inhaler, and rec trial AIRDUO 232-14 2spBid ($50 cash price from [employer];  We also refilled her TRAMADOL per request;  She will maintain f/u w/ DrBeekman, [name], [name], and her Gyn; she will seek a new PCP as I am going to retire later this yr...   ~  May 30, 2018:  335moOV & Jocelyn [name] returns stating that her breathing is the same- still feels SOB, can't get a deep breath, tight, can't get the air "IN";  She has a hx asthma/ AB/ inactive sarcoid - on AIBTDHRC1632spBid + ProairHFA rescue as needed;  She has multisystem disease (see above prob list) and followed by [name] for CARDS- HBP/ CAD- s/p IWMI w/ thrombectomy & stent, HL- on ASA81, Brillinta, Metoprolol, CardizemCD, Repatha;  DrBeekman for RHEUM- polymyositis/ myopathy/ DJD/ DDD- on Acthar injections;  [name] for ENDOCRINE- DM2/ left adrenal adenoma/ VitB12 & VitD defic- on SynjardyXR, VitD, B12 shots;  She has also seen DrWolicki for hearing loss & tinnitus...  We reviewed the following medical problems during today's office visit>      AR/ AB/ Hx Sarcoid>  On [id number] & Albut rescue inhaler prn & Flonase; we discussed Rx w/ MUCINEX 60074m1-2Bid w/ fluids...     HBP/ CAD-  s/p IWMI 8/18 w/ cath thrombectomy & drug eluting stent in RCA>  Now on ASA81, Brillinta90Bid, Metop50Bid, CardizemCD240, Losar100, Lasix20prn;  BP= 134/76, no recurrent CP, no palpit, syncope, edema; followed by [name] & last seen 01/2018- no change in meds, rec to continue aggressive secondary prevention...    VI & chr stasis changes> on low salt diet, Lasix20 prn, compression hose; she was seen in the [facility] yrs ago by DrNEaton CorporationDrSevier...    Chol>  [place] the [place] stat[place] intol, prev in the ACCELERATE lipid lowering study in 2015, CPK elev & she was taken off the drug, she's also had myalgias from Zetia; currently on REPATHA shots Q14d;  LAB f/u FLP on Repatha 05/30/18 showed TChol 202, TG 181, HDL 56, LDL 109...    DM, Obesity>  Followed by [name], note 11/2017 reviewed, [facility]-999 one daily;  A1c= 6.8    GI- GERD, IBS>  On Protonix40, Zofran4 prn; prev followed by [name]- she had EGD & Colon 7/15; presented c/o ext hem- Rx w/ sitz, baby wipes, benefiberAnusolHC...    Rheum- Polymyositis, hx FM>  Dx & followed by DrBMarylouise Stacks ACTHAR MWF; last note 02/2018 reviewed, weakness & musc pain said to be better on the Acthar; MTX & Folic was also started, later DC'd... She is also c/o LBP & wants TRAMADOL refilled...     Anxiety>  On Xanax 0,5mg89m2-1 tab Tid...    VitB12 & VitD defic>  On B12 1000mc18m Qmo per [name] &  B12 level done 8/14 = 154 w/ f/u B12 level >1500 on the shots;  she tells me that [name] treated her low VitD w/ 50K weekly, then switched to daily OTC supplement, we do not have notes from DrKerFreeport  Borderline anemia & low Ferritin>  she was treated w/ Fe from [name] & improved... EXAM shows Afeb, VSS, O2sat=95% on RA; Wt=175#, 5'2"Tall, BMI=32; Heent- neg, mallampati2;  Chest- decr BS & few bibasilar rhonchi w/o w/r; Heart- RR gr1/6 SEM w/o r/g; Abd- soft, neg; Ext- VI, tr edema; Neuro- mild wkness.  LABS  05/30/18>  FLP- ok on Repatha shots per CARDS;  Chems- ok w/ K=3.6, Cr=0.81, BS=118, LFTs wnl;  CBC- wnl w/ Hg=13.2;  TSH=0.41 IMP/PLAN>>  Jocelyn [name] is stable but persists w/ mult somatic complaints attributed to mult organ systems- she is advised to continue her current meds and maintain f/u w/ her numerous medical specialists...    ~  August 16, 2018:  2-70moROV & add-on appt requested for dyspnea and chest discomfort>  Pt called 9/27 stating she'd been fighting congestion all week w/ cough, beige phlegm, no hemoptysis, incr SOB; no relief from Mucinex & AlbutHFA inhaler; didn't take temp but felt hot- she did not want appt & ZPak + Medrol Dosepak called in for pt... She presents to the office today c/o mostly dry cough, small amt sput, and chest discomfort esp on left side along sternal border & costal margin- sore 7 tender to touch, no known trauma etc;  She notes incr SOB walking in the house, feels tight & notes occas wheeze- says it's hard to get the air "IN" and "out"; not much improvement from the Zithromax or Medrol so far... She is folowed for general medical purposes w/ hx AR/ AB/ and remote hx sarcoidosis along w/ mult medical issues including polymyositis w/ myopathy managed by DGlean Salenon ACTHAR shots 3x/wk (she also had +PM-scl-70 suggesting poss polymyositis/scleroderma overlap syndrome...  We reviewed the following interval medical notes in Epic>      She saw RHEUM- DrBeekman on 06/14/18>  F/u polymyositis w/ myopathy- had been tried on Pred, MTX, Imuran, and started on ACTHAR 04/2016; still c/o fatigue & weakness, enzymes had normalized on this med; f/u CPK sl elev at 236, CBC/ Chems ok x BS=123; same med continued and Cyclobenzaprine 566mQhs added for musc spasm...    She saw ENDOCRINE- [name] on 07/06/18>  followed for DM2, left adrenal adenoma, Vit B12 defic, VitD defic, Obesity; on SynjardyXR 25-1000 Qam;  A1c=6.8, VitB12=470,  VitD=22.7    She saw PULM-EWalsh,NP on 05/31/18>  Pt fell on the  day prior to appt trying to avoid a ?bee- hit left side, EMS called- didn't go to ER, pt is on Brillinta from Cards, had ecchymoses on left chest wall/ abd, hematoma on chin, & small knee abrasion; Chest was clear, exam otherw NEG; Labs OK; CT Head showed no acute intracranial abnormalities, small calcif meningioma along left vertex, atherosclerotic changes, no hemorrhage/ mass effect/ or CVA;  Treated w/ rest/ Tramadol/ Tylenol & improved slowly back to baseline... We reviewed the following medical problems during today's office visit>      AR/ AB/ Hx Sarcoid>  On [id number] & Albut rescue inhaler prn & Flonase; we discussed Rx w/ MUCINEX 60020m1-2Bid w/ fluids... c/o incr cough, dyspnea, chest discomfort 08/16/18 - see additional work up below...    HBP/ CAD- s/p IWMI 8/18 w/ cath thrombectomy & drug eluting stent in RCA>  Now on ASA81, Brillinta90Bid, Metop50Bid, CardizemCD240, Losar100, Lasix20prn;  BP= 130/80, no recurrent CP, no palpit, syncope, edema; followed by [name] & last seen 01/2018- no change in meds, rec to continue aggressive secondary prevention...    VI & chr stasis changes> on low salt diet, Lasix20 prn, compression hose; she was seen in the [facility] wound [facility] yrs ago by DrNEaton CorporationDrSevier...    Chol>  Followed in the Lipid  [place]; hx statin intol, prev in the ACCELERATE lipid lowering study in 2015, CPK elev & she was taken off the drug, she's also had myalgias from Zetia; currently on REPATHA shots Q14d;  LAB f/u FLP on Repatha 05/30/18 showed TChol 202, TG 181, HDL 56, LDL 109...    DM, Obesity>  Followed by [name], note 06/2018 reviewed, on SYNJARDY 2.03-999 one daily;  A1c= 6.8    GI- GERD, IBS>  On Protonix40, Zofran4 prn; prev followed by [name]- she had EGD & Colon 7/15; presented c/o ext hem- Rx w/ sitz, baby wipes, benefiberAnusolHC...    Rheum- Polymyositis, hx FM>  Dx & followed by Glean Salen- on ACTHAR twice weekly now; last note 05/2018 reviewed,  weakness & musc pain said to be better on the Acthar rx; She is also c/o LBP & wants TRAMADOL refilled- ok...     Anxiety>  On Xanax 0,94m 1/2-1 tab Tid...    VitB12 & VitD defic>  On B12 10023m IM Qmo per [name] &  B12 level 154 in 2014, last done 8/19 = 470;  she tells me that [name] treated her low VitD w/ 50K weekly, then switched to daily OTC supplement 7 last VitD level =23...    Borderline anemia & low Ferritin>  she was treated w/ Fe from [name] & improved... EXAM shows Afeb, VSS, O2sat=96% on RA; Wt=172#, 5'2"Tall, BMI=32; Heent- neg, mallampati2; Chest- clear w/o w/w/r no dullness or consolidation, +chest wall tender to palpation; Heart- RR gr1/6 SEM w/o r/g; Abd- soft, neg; Ext- VI, tr edema; Neuro- mild wkness.  CXR 08/16/18>  Borderline heart size, abnormal opacity at left medial lung base ?atx, nothing seen on lateral view, no effusion noted, part compression T9 => suggest CT Chest for further eval (ordered & pending)...  Spirometry 08/16/18>  Poor effort & difficulty w/ testing procedure - FVC=0.7 (30%), FEV1=0.6 (31%), %1sec=79%, mid-flows=31% predicted... Invalid numbers.  Ambulatory Oximetry 08/16/18>  O2sat=96% on RA at rest w/ pulse=95/min;  She ambulated 3 laps in the office (185'each) w/ lowest O2sat=94% w/ pulse=104/min... No desaturations.  LABS 08/16/18>  Chems- ok w/ BS=123, BUN=26, Cr=0.93, BNP=36;  CBC- ok w/ Hg=14.6, WBC=11.0, Sed=35;  TSH=0.90 IMP/PLAN>>  DeYarahas some incr cough, dyspnea, chest discomfort but not much to help w/ dx x some chest wall tenderness on exam=> we will proceed w/ CT Chest for further eval;  At this juncture we will change the ZPak/ Medrol dosepak to DOXY10026md x10d & PRED10m44mdTaper, and advise rest, heat, Tramadol+Tylenol for the discomfort;  She has AIRDKGMWNU272Bid & ProairHFA rescue inhaler for prn use;  She understandably has some anxiety & rec incr Alprazolam0.5mg 72mm 1Qhs to add extra 1/2tab morn & afternoon; we will hopefully know more  once the CT is done...          Problems List:  MENIERE'S DISEASE (ICD-3 - eval by ENT, DrBates- Rx w/ diuretic, low salt, Xanax vs Valium, & Antivert Prn... dizziness recurred & ENT sent her to [facility]- we don't have notes but pt reports that nothing was found.  ALLERGIC RHINITIS - increased allergy symptoms in the spring... rec> Claritin, Saline, Flonase...  Hx of ASTHMATIC BRONCHITIS, ACUTE  - requires occas Antibiotics and Medrol for infectious exac- last Oct09 & resolved w/ Avelox/ Depo/ Medrol... has not been on regular inhalers, but uses XOPENEX MDI (w/ spacer), MUCINWestmont.. ~  essentially neg CTChest 9/08 after CXR suggested RULnodule. ~  CXR 10/09 showed cardiomegaly, clear lungs, min  scoliosis, NAD. ~  CXR 9/11 showed cardiomeg, clear lungs, NAD.[name]. ~  CXR 1/14 showed cardiomeg, clear lungs, no adenopathy, mild scoliosis... ~  CXR 4/15 showed mild cardiomeg, clear lungs, NAD... ~  PFT 10/15 showed FVC=1.93 (79%), FEV1=1.55 (79%), %1sec=80%, mid-flows=70% predicted...  ~  CXR showed mild cardiomeg & sl tortuous Ao, clear lungs, DDD of Tspine, NAD... ~  10/16:  We wrote for an AlbutHFA inhaler for prn use at her request for wheezing... ~  12/16: presents w/ refractory AB- given Levaquin, already on Pred per Rheum, added Advair100Bid + Mucinex600-2Bid... ~  02/02/16: another bout of refractory AB- treated w/ Depo, Medrol taper, Augmentin, Mucinex, Proventil...  Hx of SARCOIDOSIS  - Dx'd 1980's w/ bronch bx... Rx Pred transiently and improved... no active disease x years... ~  baseline CXRs showed cardiomegaly, clear lungs, min scoliosis, NAD.  Hx of Mild OBSTRUCTIVE SLEEP APNEA - sleep eval DrClance 2006 w/ RDI= 5 only.   FOLLOWED by [facility] FOR CARDIOLOGY >>   HYPERTENSION, BENIGN  >> followeed by [name] now for CARDS...  ~  on TOPROL XL 83m- 1.5 tabs daily, CARDIZEM 2470md, LASIX 2032must as needed now... ~  prev OV's 130-140's / 80's... prev noted sl  HA, chest tightness, sl SOB, & mult somatic complaints... Labs & renal- all WNL. ~  4/12:  BP=122/76 today, and similar at home per pt... She denies CP, palpit, SOB, edema, etc... Labs & renal- all WNL. ~  1/14: on ASA81, Imdur120, ToprolXL75, Cardizem240, Lasix20prn (seldom takes); BP=128/80 & she has mult somatic c/o- CWP, tired/no energy, but denies palpit, ch in SOB, edema, etc; followed by [facility]- DrTurner/ [name] & seen recently but we don't have note; they prev added RANEXA 500m30md for her CP (now off this med); we discussed trial Lunesta 2mgQ64mfor sleep & Tramadol 50mgT32mor pain... ~  9/14:  on ASA81, Imdur120, Toprol50-1/2Bid, Cardizem240, Lasix20prn (seldom takes); BP=132/74 & she has mult somatic c/o- CWP, tired/no energy, but denies palpit, ch in SOB, edema, etc; followed by [facility]- DrTurner/ [name], no recent notes ~  4/15: on ASA81, Toprol50-1/2Bid, Cardizem240, Lasix20prn (ave 1/d) & ?Amlodip5 from [place]?;[employer]124/76 & she has mult somatic c/o- CWP, tired/no energy, but denies palpit, ch in SOB, edema, etc; followed by [facility]- [name], no recent notes. ~  4/16: on ASA81, Metop25Bid, CardizemCD240, Losar100, Lasix40; BP= 146/88 and reminded to elim sodium & get weight down...  CAD- s/p IWMI 8/18 w/ thrombectomy/stent by [name] - prev followed by DrTTurner... ~  Cath in 2000 showed non-obstructive CAD (20-30% lesions in all 3 vessels) w/ rec for risk factor modification.   ~  NuclearStressTest 4/07 showed no ischemia & EF=73%. ~  Mar10:  she's concerned about BP and chest tightness, requests Cardiac eval DrTurner & we will refer... ~  2DEcho 4/10 showed norm LVF w/ EF=55-60%, norm MV, norm AoV, Gr1DD ~  4/10 eval by DrTurner w/ MYOVIEW- neg- breast attenuation, no regional wall motion abn, EF= 75% ~  4/10 Cath by DrTurner w/ 90% small 2nd diag branch of LAD <too sm for PTCA> & luminal irreg up to 20% in RCA, +DD- Med Rx. ~  She had more chest tightness &  another Myoview from DrTurner 8/12- it too was neg, no ischemia, no infarct; +breast attenuation; EF=70% & nno regional wall motion abnormalities;  IMDUR has been incr to 120mg/d90m 1/14: she tells me [facility] don't have notes from him> EKG sent showed  NSR, rate72, wnl, NAD... ~  EKG 5/14 showed NSR, rate 93, WNL, NAD... ~  12/14: she saw [name] for Cards f/u (note reviewed)> HBP, CAD, Chol; off Imdur w/o angina, continue same meds including both Cardizem & Amlodipine, encouraged to continue [employer] study... ~  She continues regular f/u visits w/ [name], CARDS> his notes are reviewed... ~  08/22/17>  Now on ASA81, Brillinta90Bid, Metop50Bid, CardizemCD240, Losar100, Lasix20prn + Repatha; followed by [place] - she was referred to the [facility] foot clinic by DrWWoods and seen by DrSevier 6/08= ven insuffic Rx w/ TED's, no salt, elevation, etc. ~  10/10: notes bilat ankle ?lipoma in front of the lat malleoli- asymptomatic but several people have noticed them and she request refer to Ortho (rec DrBednarz when she is ready).  HYPERCHOLESTEROLEMIA  - on LIPITOR 51m/d & ZETIA 159md + CoQ10 per DrTurner... Diet & exercise stressed to the pt. ~  FLP here 12/07 on Lip10 showed TChol 156, TG 94, HDL 51, LDL 86 ~  FLP 1/09 on Lip10 showed TChol 157, TG 122, HDL 49, LDL 84 ~  [facility] Lip[facility] showed TChol 176, TG 116, HDL 55, LDL 98 ~  FLP 3/10 on Lip10 showed TChol 170, TG 94, HDL 59, LDL 93 ~  FLP 6/10 by DrTurner on Lip20 showed TChol 134, TG 82, HDL 55, LDL 73 ~  FLP 4/11 here on Lip20+Zetia? showed TChol 145, TG 74, HDL 68, LDL 63 ~  9/11:  she reports that DrTurner incr Lipitor to 4038mplus the Zetia 25m71mnd she does her FLPs. ~  FLP here 4/12 on Lip40+Zetia showed TChol 153, TG 54, HDL 53, LDL 90... Copy sent to DrTurner ~  FLP [place]DrK[place] 7/13 on Lip40+Zetia10 showed TChol 137, TG 89, HDL 42, LDL 73 ~  9/14: on  Lip40 => notes from [place] ?Lip80 +PharmQuest study drug?; she has gained 4# to 190# today; FLP is [facility] pt & she reports concern for "particle size" & last avail FLP was 7/14 by [name] w/ TChol 190, HDL 100, LDL 40 ~  FLP followed by [name] for CARDS, and [name] for Endocrine... ~  Now on REPATHA shots Q14d...   FOLLOWED by [name] FOR ENDOCRINOLOGY >>   DIABETES MELLITUS  - on Metformin 500mg59m+ PARLODEL 2.5mg/d89mr DrEllison... She is intol to Actos w/ edema, she stopped Januvia due to ?side effect, she wondered about Tajenta since her daugh works for [employer] 9/08 showed FBS=119, HgA1c=6.6 ~  [facility] BS= 108, HgA1c= 6.5 ~  labs 3/10 showed BS= 113, A1c= 6.6 ~  labs 6/10 by DrTurner showed BS= 92; and 3/11 BS= 127 ~  labs 4/11 showed BS= 124, A1c= 7.6... recMarland Kitchen incr Metform to Bid; she [facility]... ~  6/11:  pt requested Endocrine consult & seen by DrEllison w/ Januvia 100mg/d14med. ~  labs 9/11 showed BS= 98, A1c= 7.3... she Marland KitchenMarland Kitchenopped Januvia due to side effects & wonders about using Tajenta instead. ~  DrEllison started BromocyAuto-Owners Insuranced..66mhe is now taking this +Metform 500mgBid.67m  Labs 4/12 (wt=195#) showed Bs= 116, A1c= 7.1 ~  Labs 7/12 showed A1c= 7.2;  Umicroalb= 3.5 ~  Labs 10/12 (wt=186#) showed A1c= 6.7 NOTE: She participated in a [facility] trial called the AfricanAmerican- Diabetes Heart Study in 2012; they sent a report indicating: 1) her memory was fine, but her MRI  Brain showed sm vessel dis & mild sinus dis, otherw neg; 2) she may have treatable depression but we tried her on Zoloft & she was intol==> ch to Lexapro. ~  followed by [name] on Metform500Bid & Onglyza2.5; labs from [place] 9/13 showed BS=201, A1c=6.0; he rec same meds... ~  9/14: followed by [name] on Metform500Bid & Onglyza2.5 => ?he changed to Kombiglyze5-1000 daily?; last labs from [place] 7/14 showed BS=105, A1c=6.5; and she  remains on the same meds... ~  1/15: she had f/u [name] for Endocrine> DM, Obesity, Adrenal adenoma, VitB12 defic; labs reviewed, note reviewed- he tried Belviq but she was intol... ~  She continues regular follow up w/ [name]... ~  Switched to [facility] 08-999 Qam after her IWMI 8/18... A1c=6.8   FOLLOWED by [name] et al for GASTORENTEROLOGY >>   GERD SYMPTOMS - pt placed on PROTONIX 58m/d w/ improvement in reflux symptoms...    ADDENDUM>> note from [name] indicates> upper endoscopy in July 1994 showed antral gastritis. 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  IRRITABLE BOWEL SYNDROME  - colonoscopy 7/03 by [name] was WNL. ~  9/14: she was referred to GI for f/u colon but never did it... ~  4/15: we will refer her to GI again...  RENAL CALCULUS  - sm stone seen in L kidney on CTScan... [facility].  POLYMYOSITIS >> now followed by DrBeekman FIBROMYALGIA  - prev Rx by [name] in 2006... ~  1/14: This is her CC w/ not resting well, wakes tired, poor energy, aching/ sore/ tender (esp in chest in AM) & we discussed trial Lunesta 21mhs, & Tramadol 5059md; plus rest, heat, etc... States she's INTOL Ambien w/ weird dreams & Benedryl/ Melatonin w/o help... ~  11/14: she went to the ER w/ LBP> felt to be a lumbar strain... 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... supplemented w/ OTC Vit D 1000 u daily. ~  Vit D level was lower from [name] & he treated w/ Vit D 50K weekly for awhile, then switched to OTC supplement...  ANXIETY - on ALPRAZOLAM 0.5mg52mn... DEPRESSION - prev on Lexapro 20mg102mut she stopped on her own ~10/13 ?cost issues? ~  4/12: c/o feeling sad & anxious all the time- weak, not resting well, under a lot of stress; rec to seek counselling thru her local church or let us reKorear [facility] w/ Zoloft 50==> but she was INTOL... ~  2013: switched to Lexapro & seems to be doing better but didn't stick w/ it due to cost issues...  ANEMIA & LOW FERRITIN LEVEL >>  B12 DEFICIENCY >> treated by [name] w/ B12 shots ~  9/14: she tells me that Ferritin is low & [name] tried her on Oral Fe prep w/o  improvement & wants me to f/u and treat this problem; we discussed need to recheck labs here- LABS 9/14 showed Hg=12.5, MCV=88, Fe=47 (12%sat), Ferritin=10.8; she is due for f/u colon w/ [name] & we will refer, rec to start FeSO4 329m Bid w/ VitC500 w/ plans for f/u CBC, Fe studies in 2-373mo~  4/15:  Labs showed Hg= 12.5 but Fe=31 (8%); she was only taking the Fe once daily 7 advised to incr to Bid w/ vitC500; needs f/u colon & we will refer to [name] again...  HEALTH MAINTENANCE: ~  GI:  Followed by [name] & colon due 7/13... ~  GYN:  Followed by DrDillard & she gets yearly PAP, Mammogram, hasn't had baseline BMD yet, [facility]... ~  Immunizations:  She gets the yearly Flu shots at school;  Had PNEUMOVAX previously; TDAP given 4/12...   Past Surgical History:  Procedure Laterality Date  . CORONARY STENT INTERVENTION N/A 06/15/2017   Procedure: CORONARY STENT INTERVENTION;  Surgeon: VaJettie BoozeMD;  [facility];  Service: Cardiovascular;  Laterality: N/A;  . heart catherization    . LEFT HEART CATH AND CORONARY  ANGIOGRAPHY N/A 06/15/2017   Procedure: LEFT HEART CATH AND CORONARY ANGIOGRAPHY;  Surgeon: VaJettie BoozeMD;  Location: [facility];  Service: Cardiovascular;  Laterality: N/A;  . TONSILLECTOMY    No other past surgical history on file - other than Lithotripsy for renal stone...   Outpatient Encounter Medications as of 08/16/2018  Medication Sig  . albuterol (PROAIR HFA) 108 (90 Base) MCG/ACT inhaler Inhale 1-2 puffs into the lungs every 6 (six) hours as needed for wheezing or shortness of breath.  . ALPRAZolam (XANAX) 0.5 MG tablet 1/2 morning, 1/2 in afternoon, and 1 at bedtime  . aspirin 81 MG chewable tablet Chew 1 tablet (81 mg total) by mouth daily.  . Marland Kitchenzithromycin (ZITHROMAX) 250 MG tablet Take 2 pills today then one a day for 4 additional days  . BRILINTA 90 MG TABS tablet TAKE 1 TABLET BY MOUTH TWICE A DAY  . CARTIA XT 240 MG 24 hr capsule TAKE 1 CAPSULE(240 MG) BY MOUTH DAILY  . Cyanocobalamin 1000 MCG/ML KIT Inject 1,000 mg as directed every 30 (thirty) days.  . Marland Kitchenoxycycline (VIBRA-TABS) 100 MG tablet Take 1 tablet (100 mg total) by mouth 2 (two) times daily.  . Empagliflozin-metFORMIN HCl ER (SYNJARDY XR) 25-1000 MG TB24 Take 1 tablet by mouth daily.  . fluticasone (FLONASE) 50 MCG/ACT nasal spray Place 2 sprays into both nostrils at bedtime.  . Fluticasone-Salmeterol (AIRDUO RESPICLICK 23409/8123191-47CG/ACT AEPB Inhale 2 puffs into the lungs 2 (two) times daily.  . furosemide (LASIX) 20 MG tablet Take 20 mg by mouth daily as needed for fluid or edema.  . Marland KitchenP ACTHAR 80 UNIT/ML injectable gel Inject 80 Units into the skin 3 (three) times a week.   . losartan (COZAAR) 100 MG tablet TAKE 1 TABLET BY MOUTH ONCE DAILY  . methylPREDNISolone (MEDROL DOSEPAK) 4 MG TBPK tablet follow package directions  . metoprolol tartrate (LOPRESSOR) 50 MG tablet TAKE 1 TABLET BY MOUTH TWICE A DAY  . nitroGLYCERIN (NITROSTAT) 0.4 MG SL tablet Place 1 tablet (0.4 mg total) under the tongue  every 5 (five) minutes as needed for chest pain.  . Marland Kitchenndansetron (ZOFRAN-ODT) 4 MG disintegrating tablet Take 4 mg by mouth every 8 (eight) hours as needed for nausea or vomiting.  . Glory RosebushERIO test strip   . pantoprazole (PROTONIX) 40 MG  tablet take 1 tablet by mouth once daily  . predniSONE (DELTASONE) 10 MG tablet 1 tab twicedailyx's4 day,1 tabx4 day,1/2 tab x 4 day,1/2 tab everyother day til gone  . REPATHA SURECLICK 876 MG/ML SOAJ INJECT 1 PEN INTO THE SKIN EVERY 14 (FOURTEEN) DAYS. REFRIGERATE.  [name] traMADol (ULTRAM) 50 MG tablet Take 1 tablet (50 mg total) by mouth 3 (three) times daily as needed.  . valACYclovir (VALTREX) 500 MG tablet Take 500 mg by mouth daily.   . [DISCONTINUED] mometasone-formoterol (DULERA) 100-5 MCG/ACT AERO Inhale 2 puffs into the lungs 2 (two) times daily.   No facility-administered encounter medications on file as of 08/16/2018.     Allergies  Allergen Reactions  . Actos [Pioglitazone] Other (See Comments)    REACTION: pt states INTOL w/ edema  . Codeine Other (See Comments)    REACTION: vomiting  . Januvia [Sitagliptin] Nausea Only  . Lactose Intolerance (Gi) Diarrhea  . Morphine Nausea Only and Nausea And Vomiting    REACTION: vomiting REACTION: vomiting    Immunization History  Administered Date(s) Administered  . Influenza Split 08/16/2011, 08/16/2012, 08/16/2013, 09/04/2014  . Influenza Whole 09/16/2009, 08/15/2010  . Influenza,inj,Quad PF,6+ Mos 08/30/2016, 06/16/2017  . Influenza-Unspecified 08/19/2015  . Pneumococcal Polysaccharide-23 11/15/2004  . Tdap 02/22/2011    Current Medications, Allergies, Past Medical History, Past Surgical History, Family History, and Social History were reviewed in [employer] record.   Review of Systems        See HPI - all other systems neg except as noted...      The patient complains of weight gain, dyspnea on exertion, and peripheral edema.  The patient denies anorexia, fever,  weight loss, vision loss, decreased hearing, hoarseness, chest pain, syncope, prolonged cough, headaches, hemoptysis, abdominal pain, melena, hematochezia, severe indigestion/heartburn, hematuria, incontinence, genital sores, suspicious skin lesions, transient blindness, difficulty walking, depression, unusual weight change, abnormal bleeding, enlarged lymph nodes, and angioedema.     Objective:   Physical Exam     WD, Overweight, 62 y/o BF in NAD... GENERAL:  Alert & oriented; pleasant & cooperative. HEENT:  [place]/AT, EOM-wnl, PERRLA, EACs-clear, TMs-wnl, NOSE-clear, THROAT-clear & wnl. NECK:  Supple w/ fairROM; no JVD; normal carotid impulses w/o bruits; no thyromegaly or nodules palpated; no lymphadenopathy. CHEST:  decrBS at bases, few scar rhonchi & end-exp wheezing, no rales, no consolidation... HEART:  Regular Rhythm; without murmurs/ rubs/ or gallops. ABDOMEN:  Soft & nontender; normal bowel sounds; no organomegaly or masses detected. EXT: without deformities, mild arthritic changes; no varicose veins but +venous insuffic & tr edema; +trigger points. NEURO:  CN's intact;  no focal neuro deficits... DERM:  No lesions noted; no rash etc...  RADIOLOGY DATA:  Reviewed in the EPIC EMR & discussed w/ the patient...  LABORATORY DATA:  Reviewed in the EPIC EMR & discussed w/ the patient...   Assessment & Plan:    RESP>  Hx AR, HxAsthma, old sarcoid, mild OSA>  All stable, breathing OK, notes some wheezing w/ activity; exam reveals some secretions in the airway & rec to take MUCINEX 1247m bid, Fluids, etc;. CXR remains stable/ NAD... 10/20/15>  Presented w/ refractory AB- already on Pred- given Depo80, adding Levaquin500 x7d, ADVAIR100Bid, Mucinex600-2Bid + fluids... 02/02/16>  Presents w/ another refractory AB- currently off Pred & MTX, on Imuran; treated w/ Depo, Medrol taper, Augmentin, Mucinex, Albut=HFA...  04/2016>  Back to baseline 11/04/16>  She has URI & we wrote for ZPak => subseq  Augmentin, Saline, flonase, Mucinex... 05/05/17>  Patsie is improved s/p Augmentin & Pred Rx for her URI & AB exac;  She remains on Symbicort80-2spBid + Mucinex600- 1to2Bid w/ extra fluids;  I reminded her to be sure to requests notes sent to [name] from [facility] as they are not on Epic;  She will maintain f/u w/ these specialists + [name] for CARDS;  We plan recheck in 3-33mo4/8/19>   We refilled XANAX 0.587m take 1/2 tab bid & 1Qhs #60/mo;  We refilled PROAIR-HFA rescue inhaler, and rec trial AIRDUO 232-14 2spBid ($50 cash price from [employer]  We also refilled her TRAMADOL per request;  She will maintain f/u w/ DrBeekman, [name], [name], and her GyConcha Norwayshe will seek a new PCP as I am going to retire later this yr. 05/30/18>   DeMarilouises stable but persists w/ mult somatic complaints attributed to mult organ systems- she is advised to continue her current meds and maintain f/u w/ her numerous medical specialists...  08/16/18>   DeWayneshaas some incr cough, dyspnea, chest discomfort but not much to help w/ dx x some chest wall tenderness on exam=> we will proceed w/ CT Chest for further eval;  At this juncture we will change the ZPak/ Medrol dosepak to DOXY10022md x10d & PRED10m8mdTaper, and advise rest, heat, Tramadol+Tylenol for the discomfort;  She has [id number] & ProairHFA rescue inhaler for prn use;  She understandably has some anxiety & rec incr Alprazolam0.5mg 48mm 1Qhs to add extra 1/2tab morn & afternoon; we will hopefully know more once the CT is done.   CARDS>  Followed by DrTTurner/ VaranRica [facility] listed? SHE DID NOT BRING MEDS TO THE OV> Hx HBP, CAD, VI, etc;  Chest pain is felt to be non-cardiac & likely related to her FM; Doing satis & we reviewed exercise program etc...  CHOL>  [facility]- intol Lip + Zetia; she took part in a study drug from [facility] => [facility]>  Followed by [name] on Metformin, Onglyzal;  A1c improved to 6.7 in 2012, then 6.0 in 2013, & 6.5  In 2014;  rec continue meds & asked to have records sent to us...[place]>  Stable on Protonix as needed; see GI eval from [name] 7/15...  POLYMYOSITIS/ FM>  Diagnoses w/ Polymyositis recently (2016) & started on Pred, MTX, folate by DrBeekman=> switched to ACTHAAMR Corporationinsurance doesn't want to pay, she is reminded to have records sent to us...[place]>  She remains on Alprazolam as needed; & she stopped the Lexapro..  Anemia, Low Ferritin> on FeSO4 325mg 42mtC 500...   B12 Defic> on B12 shots per [name]...  ANXIETY>  DeniseSharlizeeen having progressive difficulties at work ([employer] to this stress and her mult medical issues including polymyositis (DrBeekman), DM2 ([name]), heart problems ([name])- she tells me she is taking a LOA for the rest of this school yr & planning on retirement...   Patient's Medications  New Prescriptions   DOXYCYCLINE (VIBRA-TABS) 100 MG TABLET    Take 1 tablet (100 mg total) by mouth 2 (two) times daily.   PREDNISONE (DELTASONE) 10 MG TABLET    1 tab twicedaily x4 day,1 tabx4 day,1/2 tab x 4 day,1/2 tab everyother day til gone  Previous Medications   ALBUTEROL (PROAIR HFA) 108 (90 BASE) MCG/ACT INHALER    Inhale 1-2 puffs into the lungs every 6 (six) hours as needed for wheezing or shortness  of breath.   ALPRAZOLAM (XANAX) 0.5 MG TABLET    1/2 morning, 1/2 in afternoon, and 1 at bedtime   ASPIRIN 81 MG CHEWABLE TABLET    Chew 1 tablet (81 mg total) by mouth daily.    BRILINTA 90 MG TABS TABLET    TAKE 1 TABLET BY MOUTH TWICE A DAY   CARTIA XT 240 MG 24 HR CAPSULE    TAKE 1 CAPSULE(240 MG) BY MOUTH DAILY   CYANOCOBALAMIN 1000 MCG/ML KIT    Inject 1,000 mg as directed every 30 (thirty) days.   EMPAGLIFLOZIN-METFORMIN HCL ER (SYNJARDY XR) 25-1000 MG TB24    Take 1 tablet by mouth daily.   FLUTICASONE (FLONASE) 50 MCG/ACT NASAL SPRAY    Place 2 sprays into both nostrils  at bedtime.   FLUTICASONE-SALMETEROL (AIRDUO RESPICLICK 867/61) 950-93 MCG/ACT AEPB    Inhale 2 puffs into the lungs 2 (two) times daily.   FUROSEMIDE (LASIX) 20 MG TABLET    Take 20 mg by mouth daily as needed for fluid or edema.   HP ACTHAR 80 UNIT/ML INJECTABLE GEL    Inject 80 Units into the skin 3 (three) times a week.    LOSARTAN (COZAAR) 100 MG TABLET    TAKE 1 TABLET BY MOUTH ONCE DAILY    METOPROLOL TARTRATE (LOPRESSOR) 50 MG TABLET    TAKE 1 TABLET BY MOUTH TWICE A DAY   NITROGLYCERIN (NITROSTAT) 0.4 MG SL TABLET    Place 1 tablet (0.4 mg total) under the tongue every 5 (five) minutes as needed for chest pain.   ONDANSETRON (ZOFRAN-ODT) 4 MG DISINTEGRATING TABLET    Take 4 mg by mouth every 8 (eight) hours as needed for nausea or vomiting.   ONETOUCH VERIO TEST STRIP       PANTOPRAZOLE (PROTONIX) 40 MG TABLET    take 1 tablet by mouth once daily   REPATHA SURECLICK 267 MG/ML SOAJ    INJECT 1 PEN INTO THE SKIN EVERY 14 (FOURTEEN) DAYS. REFRIGERATE.   TRAMADOL (ULTRAM) 50 MG TABLET    Take 1 tablet (50 mg total) by mouth 3 (three) times daily as needed.   VALACYCLOVIR (VALTREX) 500 MG TABLET    Take 500 mg by mouth daily.   Modified Medications   No medications on file  Discontinued Medications   MOMETASONE-FORMOTEROL (DULERA) 100-5 MCG/ACT AERO    Inhale 2 puffs into the lungs 2 (two) times daily.

## 2018-08-17 ENCOUNTER — Emergency Department (HOSPITAL_COMMUNITY): Payer: BC Managed Care – PPO

## 2018-08-17 ENCOUNTER — Telehealth: Payer: Self-pay | Admitting: Interventional Cardiology

## 2018-08-17 ENCOUNTER — Other Ambulatory Visit: Payer: Self-pay | Admitting: Pulmonary Disease

## 2018-08-17 ENCOUNTER — Inpatient Hospital Stay (HOSPITAL_COMMUNITY)
Admission: EM | Admit: 2018-08-17 | Discharge: 2018-08-21 | DRG: 187 | Disposition: A | Discharge status: Home / Self Care | Payer: BC Managed Care – PPO | Attending: Family Medicine | Admitting: Family Medicine

## 2018-08-17 ENCOUNTER — Other Ambulatory Visit: Payer: Self-pay

## 2018-08-17 ENCOUNTER — Encounter (HOSPITAL_COMMUNITY): Payer: Self-pay | Admitting: Emergency Medicine

## 2018-08-17 DIAGNOSIS — F329 Major depressive disorder, single episode, unspecified: Secondary | ICD-10-CM | POA: Diagnosis present

## 2018-08-17 DIAGNOSIS — Z9889 Other specified postprocedural states: Secondary | ICD-10-CM

## 2018-08-17 DIAGNOSIS — M797 Fibromyalgia: Secondary | ICD-10-CM | POA: Diagnosis present

## 2018-08-17 DIAGNOSIS — H8109 Meniere's disease, unspecified ear: Secondary | ICD-10-CM | POA: Diagnosis present

## 2018-08-17 DIAGNOSIS — M3320 Polymyositis, organ involvement unspecified: Secondary | ICD-10-CM | POA: Diagnosis present

## 2018-08-17 DIAGNOSIS — R0789 Other chest pain: Secondary | ICD-10-CM

## 2018-08-17 DIAGNOSIS — J189 Pneumonia, unspecified organism: Secondary | ICD-10-CM

## 2018-08-17 DIAGNOSIS — Z87442 Personal history of urinary calculi: Secondary | ICD-10-CM

## 2018-08-17 DIAGNOSIS — N179 Acute kidney failure, unspecified: Secondary | ICD-10-CM | POA: Diagnosis present

## 2018-08-17 DIAGNOSIS — K219 Gastro-esophageal reflux disease without esophagitis: Secondary | ICD-10-CM | POA: Diagnosis present

## 2018-08-17 DIAGNOSIS — Z6831 Body mass index (BMI) 31.0-31.9, adult: Secondary | ICD-10-CM

## 2018-08-17 DIAGNOSIS — Z888 Allergy status to other drugs, medicaments and biological substances status: Secondary | ICD-10-CM

## 2018-08-17 DIAGNOSIS — J181 Lobar pneumonia, unspecified organism: Secondary | ICD-10-CM | POA: Diagnosis not present

## 2018-08-17 DIAGNOSIS — Z7951 Long term (current) use of inhaled steroids: Secondary | ICD-10-CM

## 2018-08-17 DIAGNOSIS — I2510 Atherosclerotic heart disease of native coronary artery without angina pectoris: Secondary | ICD-10-CM

## 2018-08-17 DIAGNOSIS — Z91011 Allergy to milk products: Secondary | ICD-10-CM

## 2018-08-17 DIAGNOSIS — E785 Hyperlipidemia, unspecified: Secondary | ICD-10-CM | POA: Diagnosis not present

## 2018-08-17 DIAGNOSIS — J90 Pleural effusion, not elsewhere classified: Secondary | ICD-10-CM | POA: Diagnosis not present

## 2018-08-17 DIAGNOSIS — Z8709 Personal history of other diseases of the respiratory system: Secondary | ICD-10-CM

## 2018-08-17 DIAGNOSIS — Z8719 Personal history of other diseases of the digestive system: Secondary | ICD-10-CM

## 2018-08-17 DIAGNOSIS — D869 Sarcoidosis, unspecified: Secondary | ICD-10-CM | POA: Diagnosis present

## 2018-08-17 DIAGNOSIS — Z885 Allergy status to narcotic agent status: Secondary | ICD-10-CM

## 2018-08-17 DIAGNOSIS — I10 Essential (primary) hypertension: Secondary | ICD-10-CM | POA: Diagnosis present

## 2018-08-17 DIAGNOSIS — J9811 Atelectasis: Secondary | ICD-10-CM | POA: Diagnosis present

## 2018-08-17 DIAGNOSIS — J942 Hemothorax: Secondary | ICD-10-CM | POA: Diagnosis present

## 2018-08-17 DIAGNOSIS — Z7902 Long term (current) use of antithrombotics/antiplatelets: Secondary | ICD-10-CM

## 2018-08-17 DIAGNOSIS — I252 Old myocardial infarction: Secondary | ICD-10-CM

## 2018-08-17 DIAGNOSIS — F411 Generalized anxiety disorder: Secondary | ICD-10-CM | POA: Diagnosis present

## 2018-08-17 DIAGNOSIS — K589 Irritable bowel syndrome without diarrhea: Secondary | ICD-10-CM | POA: Diagnosis present

## 2018-08-17 DIAGNOSIS — I872 Venous insufficiency (chronic) (peripheral): Secondary | ICD-10-CM | POA: Diagnosis present

## 2018-08-17 DIAGNOSIS — E559 Vitamin D deficiency, unspecified: Secondary | ICD-10-CM | POA: Diagnosis present

## 2018-08-17 DIAGNOSIS — Z79899 Other long term (current) drug therapy: Secondary | ICD-10-CM

## 2018-08-17 DIAGNOSIS — J309 Allergic rhinitis, unspecified: Secondary | ICD-10-CM | POA: Diagnosis present

## 2018-08-17 DIAGNOSIS — Z955 Presence of coronary angioplasty implant and graft: Secondary | ICD-10-CM

## 2018-08-17 DIAGNOSIS — Z7982 Long term (current) use of aspirin: Secondary | ICD-10-CM

## 2018-08-17 DIAGNOSIS — E782 Mixed hyperlipidemia: Secondary | ICD-10-CM | POA: Diagnosis present

## 2018-08-17 DIAGNOSIS — G4733 Obstructive sleep apnea (adult) (pediatric): Secondary | ICD-10-CM | POA: Diagnosis present

## 2018-08-17 DIAGNOSIS — E119 Type 2 diabetes mellitus without complications: Secondary | ICD-10-CM | POA: Diagnosis present

## 2018-08-17 DIAGNOSIS — Z79891 Long term (current) use of opiate analgesic: Secondary | ICD-10-CM

## 2018-08-17 DIAGNOSIS — E1165 Type 2 diabetes mellitus with hyperglycemia: Secondary | ICD-10-CM

## 2018-08-17 DIAGNOSIS — Z7984 Long term (current) use of oral hypoglycemic drugs: Secondary | ICD-10-CM

## 2018-08-17 LAB — CBC
HEMATOCRIT: 38.1 % (ref 36.0–46.0)
HEMOGLOBIN: 12.2 g/dL (ref 12.0–15.0)
MCH: 30.5 pg (ref 26.0–34.0)
MCHC: 32 g/dL (ref 30.0–36.0)
MCV: 95.3 fL (ref 78.0–100.0)
Platelets: 400 10*3/uL (ref 150–400)
RBC: 4 MIL/uL (ref 3.87–5.11)
RDW: 13.3 % (ref 11.5–15.5)
WBC: 16.6 10*3/uL — ABNORMAL HIGH (ref 4.0–10.5)

## 2018-08-17 LAB — BASIC METABOLIC PANEL
Anion gap: 12 (ref 5–15)
BUN: 27 mg/dL — ABNORMAL HIGH (ref 8–23)
CHLORIDE: 107 mmol/L (ref 98–111)
CO2: 22 mmol/L (ref 22–32)
CREATININE: 1.13 mg/dL — AB (ref 0.44–1.00)
Calcium: 9.3 mg/dL (ref 8.9–10.3)
GFR calc non Af Amer: 51 mL/min — ABNORMAL LOW (ref >60)
GFR, EST AFRICAN AMERICAN: 59 mL/min — AB (ref >60)
Glucose, Bld: 232 mg/dL — ABNORMAL HIGH (ref 70–99)
Potassium: 4.3 mmol/L (ref 3.5–5.1)
Sodium: 141 mmol/L (ref 135–145)

## 2018-08-17 LAB — I-STAT TROPONIN, ED: Troponin i, poc: 0 ng/mL (ref 0.00–0.08)

## 2018-08-17 LAB — TROPONIN I: Troponin I: <0.03 ng/mL (ref <0.03)

## 2018-08-17 LAB — GLUCOSE, CAPILLARY: GLUCOSE-CAPILLARY: 202 mg/dL — AB (ref 70–99)

## 2018-08-17 MED ORDER — DILTIAZEM HCL ER COATED BEADS 240 MG PO CP24
240.0000 mg | ORAL_CAPSULE | Freq: Every day | ORAL | Status: DC
  Administered 2018-08-18 – 2018-08-21 (×4): 240 mg via ORAL
  Filled 2018-08-17 (×4): qty 1

## 2018-08-17 MED ORDER — IOHEXOL 300 MG/ML  SOLN
75.0000 mL | Freq: Once | INTRAMUSCULAR | Status: AC | PRN
  Administered 2018-08-17: 75 mL via INTRAVENOUS

## 2018-08-17 MED ORDER — NITROGLYCERIN 0.4 MG SL SUBL: 0.4000 mg | SUBLINGUAL_TABLET | SUBLINGUAL | Status: DC | PRN

## 2018-08-17 MED ORDER — POLYETHYLENE GLYCOL 3350 17 G PO PACK
17.0000 g | PACK | Freq: Every day | ORAL | Status: DC
  Administered 2018-08-19 – 2018-08-21 (×3): 17 g via ORAL
  Filled 2018-08-17 (×4): qty 1

## 2018-08-17 MED ORDER — FLUTICASONE FUROATE-VILANTEROL 200-25 MCG/INH IN AEPB
1.0000 | INHALATION_SPRAY | Freq: Every day | RESPIRATORY_TRACT | Status: DC
  Administered 2018-08-18 – 2018-08-21 (×4): 1 via RESPIRATORY_TRACT
  Filled 2018-08-17: qty 28

## 2018-08-17 MED ORDER — TRAMADOL HCL 50 MG PO TABS
50.0000 mg | ORAL_TABLET | Freq: Three times a day (TID) | ORAL | Status: DC | PRN
  Administered 2018-08-18 – 2018-08-19 (×2): 50 mg via ORAL
  Filled 2018-08-17 (×2): qty 1

## 2018-08-17 MED ORDER — ASPIRIN 81 MG PO CHEW
81.0000 mg | CHEWABLE_TABLET | Freq: Every day | ORAL | Status: DC
  Administered 2018-08-18 – 2018-08-21 (×4): 81 mg via ORAL
  Filled 2018-08-17 (×4): qty 1

## 2018-08-17 MED ORDER — METOPROLOL TARTRATE 50 MG PO TABS
50.0000 mg | ORAL_TABLET | Freq: Two times a day (BID) | ORAL | Status: DC
  Administered 2018-08-17 – 2018-08-21 (×8): 50 mg via ORAL
  Filled 2018-08-17 (×8): qty 1

## 2018-08-17 MED ORDER — PANTOPRAZOLE SODIUM 40 MG PO TBEC
40.0000 mg | DELAYED_RELEASE_TABLET | Freq: Every day | ORAL | Status: DC
  Administered 2018-08-18 – 2018-08-21 (×4): 40 mg via ORAL
  Filled 2018-08-17 (×4): qty 1

## 2018-08-17 MED ORDER — ALBUTEROL SULFATE (2.5 MG/3ML) 0.083% IN NEBU: 2.5000 mg | INHALATION_SOLUTION | Freq: Four times a day (QID) | RESPIRATORY_TRACT | Status: DC | PRN

## 2018-08-17 MED ORDER — HYDROMORPHONE HCL 1 MG/ML IJ SOLN: 0.5000 mg | INTRAMUSCULAR | Status: DC | PRN

## 2018-08-17 MED ORDER — DEXTROSE 5 % IV SOLN
250.0000 mg | INTRAVENOUS | Status: DC
  Administered 2018-08-18 – 2018-08-19 (×2): 250 mg via INTRAVENOUS
  Filled 2018-08-17 (×3): qty 250

## 2018-08-17 MED ORDER — SODIUM CHLORIDE 0.9 % IV SOLN
500.0000 mg | Freq: Once | INTRAVENOUS | Status: AC
  Administered 2018-08-17: 500 mg via INTRAVENOUS
  Filled 2018-08-17: qty 500

## 2018-08-17 MED ORDER — ONDANSETRON 4 MG PO TBDP
4.0000 mg | ORAL_TABLET | Freq: Three times a day (TID) | ORAL | Status: DC | PRN
  Administered 2018-08-17 – 2018-08-21 (×3): 4 mg via ORAL
  Filled 2018-08-17 (×3): qty 1

## 2018-08-17 MED ORDER — ALBUTEROL SULFATE HFA 108 (90 BASE) MCG/ACT IN AERS: 1.0000 | INHALATION_SPRAY | Freq: Four times a day (QID) | RESPIRATORY_TRACT | Status: DC | PRN

## 2018-08-17 MED ORDER — FLUTICASONE PROPIONATE 50 MCG/ACT NA SUSP
2.0000 | Freq: Every day | NASAL | Status: DC | PRN
  Administered 2018-08-18 – 2018-08-20 (×2): 2 via NASAL
  Filled 2018-08-17: qty 16

## 2018-08-17 MED ORDER — HYDRALAZINE HCL 20 MG/ML IJ SOLN: 5.0000 mg | INTRAMUSCULAR | Status: DC | PRN

## 2018-08-17 MED ORDER — ACETAMINOPHEN 325 MG PO TABS
325.0000 mg | ORAL_TABLET | Freq: Once | ORAL | Status: AC
  Administered 2018-08-17: 325 mg via ORAL
  Filled 2018-08-17: qty 1

## 2018-08-17 MED ORDER — INSULIN ASPART 100 UNIT/ML ~~LOC~~ SOLN
0.0000 [IU] | Freq: Three times a day (TID) | SUBCUTANEOUS | Status: DC
  Administered 2018-08-18 – 2018-08-19 (×3): 1 [IU] via SUBCUTANEOUS
  Administered 2018-08-20: 2 [IU] via SUBCUTANEOUS
  Administered 2018-08-21: 1 [IU] via SUBCUTANEOUS

## 2018-08-17 MED ORDER — HYDROMORPHONE HCL 1 MG/ML IJ SOLN
1.0000 mg | Freq: Once | INTRAMUSCULAR | Status: AC
  Administered 2018-08-17: 1 mg via INTRAVENOUS
  Filled 2018-08-17: qty 1

## 2018-08-17 MED ORDER — SODIUM CHLORIDE 0.9 % IV SOLN
1.0000 g | INTRAVENOUS | Status: DC
  Administered 2018-08-18 – 2018-08-19 (×2): 1 g via INTRAVENOUS
  Filled 2018-08-17 (×3): qty 10

## 2018-08-17 MED ORDER — INSULIN ASPART 100 UNIT/ML ~~LOC~~ SOLN
0.0000 [IU] | Freq: Every day | SUBCUTANEOUS | Status: DC
  Administered 2018-08-17: 2 [IU] via SUBCUTANEOUS

## 2018-08-17 MED ORDER — ACETAMINOPHEN 325 MG PO TABS
650.0000 mg | ORAL_TABLET | ORAL | Status: DC | PRN
  Administered 2018-08-19 – 2018-08-20 (×4): 650 mg via ORAL
  Filled 2018-08-17 (×5): qty 2

## 2018-08-17 MED ORDER — SODIUM CHLORIDE 0.9 % IV SOLN
1.0000 g | Freq: Once | INTRAVENOUS | Status: AC
  Administered 2018-08-17: 1 g via INTRAVENOUS
  Filled 2018-08-17: qty 10

## 2018-08-17 NOTE — ED Notes (Signed)
Patient transported to CT 

## 2018-08-17 NOTE — H&P (Addendum)
[facility] Admission History and Physical Service Pager: [phone]  Patient name: Jocelyn [name] Medical record number: [id number] Date of birth: 09/03/1956 Age: 62 y.o. Gender: female  Primary Care Provider: Noralee Space, MD Consultants: none Code Status: full  Chief Complaint: left sided chest/back pain  Assessment and Plan: Jocelyn [name] is a 62 y.o. female presenting with left loculated pleural effusion and LLL and lingula consolidation, with atelectasis. PMH is significant for sarcoidosis, CAD s/p PCI w/stent placement, DM, hyperlipidemia, asthma, GERD, OSA.  Pleural effusion- presents with acute onset left sided chest pain with increased WBC 16.6, CXRAY and chest CT significant for Significant loculated pleural effusion on left side with consolidation of lower left lobe and lingula and significant atelectasis. Significant worsening from CXRAY at pulmonology appointment yesterday. Patient has no new oxygen requirement, does not complain of dyspnea, and has stable vitals including afebrile. She was started on CTX and zithromax in ED. Will likely need chest tube drainage +tpa. Etiology of effusion possibly secondary to pleural mass along left mediastinal border and extending into left lung apex which is possible neoplasm- primary or metastatic unclear. Infection also possible contribution to the effusion.  - admit to telemetry, attending Dr. Andria Frames - consulted pulmonology (will see in am) - consult to respiratory therapy for metnebs - continuous cardiac monitoring and pulse ox - incentive spirometer - continue IV CTX and zithromax - IV dilaudid PRN pain - tramadol PRN - tylenol PRN - flonase PRN -zofran PRN - SCDs and hold brilinta temporarily in case of potential procedure  Sarcoidosis-  - consulted pulmonology (to see her in am) - patient is due for her two weekly medications(acthar/repatha), appreciate pulmonology recs - albuterol PRN -  Breo-ellipta daily  CAD- initial troponins negative, EKG no ischemia -continue home meds  -ASA  -diltiazem - continuous cardiac monitoring - trend troponin - repeat EKG in am -again, holding brilinta temporarily in light of potential precedure  HTN- hypertensive on admission as high as 178/98 - continue home meds  -metoprolol tartrate  -nitro SL - hydralazine PRN  DM- on home meotformin/empagaflozin.  CBGs mids 200s on admission - hold home meds - CBG checks - SSI - check A1c (last documented in our system 2012 >6)  OSA- patient denies being prescribed a CPAP after sleep study  GERD -pantoprazole PRN  FEN/GI: saline lock, heart healthy/carb modified diet Prophylaxis: SCDs  Disposition: telemetry   History of Present Illness:  Jocelyn [name] is a 62 y.o. female presenting with acute onset left sided, constant, sharp pain. Patient was otherwise healthy prior to Friday. She began experiencing "chest congestion" at that time. She took OTC mucinex with no improvement in symptoms. She remained afebrile with no dyspnea but did have increased yellow/mucoid production with increased frequency of dry cough. Today, patient began experiencing constant, sharp pain worsening throughout the day on the left side of her entire thorax from anterior to posterior. The pain decreased patient's ability to move. She states that the pain is 10/10 constant sharp pain on left side of chest with no changes in severity related to positioning. Not improved with tramadol, tylenol, or nitro. She states that it is worse with coughing and talking. She has never experienced a pain like this before. It caused nausea, but no vomiting. She was seen by her pulmonologist yesterday who got an xray showing small lower left pleural effusion- no intervention started at that time. She presented to ED today after speaking to cardiologist's nurse about  continued chest pain who suggested going to hospital. In the ED patient was  given dilaudid which she states did decrease the pain to a tolerable level. Patient denies any skin changes or rashes in distribution of the pain. She has 1 week of "spider bites" on her left thigh which has improved with topical OTC antibiotic cream.  Review Of Systems: Per HPI with the following additions:   Review of Systems  Constitutional: Negative for chills, diaphoresis, fever, malaise/fatigue and weight loss.  HENT: Positive for congestion. Negative for sinus pain and sore throat.   Respiratory: Positive for cough, sputum production (yellow, mucoid), shortness of breath and wheezing.   Cardiovascular: Positive for chest pain. Negative for leg swelling.  Gastrointestinal: Positive for abdominal pain and nausea. Negative for constipation, diarrhea and vomiting.  Musculoskeletal: Positive for back pain and neck pain.  Skin: Positive for itching (on bug bites) and rash (6 bug bites on left thigh).  Neurological: Positive for dizziness and headaches.    Patient Active Problem List   Diagnosis Date Noted  . Pneumonia 08/17/2018  . Dyspnea 08/16/2018  . Chest wall pain 08/16/2018  . Fall on or from sidewalk curb, initial encounter 05/31/2018  . History of acute inferior wall MI 08/22/2017  . Status post coronary artery stent placement   . Diabetes mellitus ([place])   . Acute ST elevation myocardial infarction (STEMI) involving right coronary artery ([place])   . Overweight 05/05/2017  . Diabetes mellitus type 2, controlled, without complications ([place]) 03/55/9741  . Asthmatic bronchitis , chronic ([place]) 05/10/2016  . Polymyositis ([place]) 09/15/2015  . History of sarcoidosis 09/15/2015  . Falling 03/13/2015  . Edema 02/13/2014  . Coronary atherosclerosis of native coronary artery 10/29/2013  . Mixed hyperlipidemia 10/29/2013  . Upper respiratory tract infection 08/08/2013  . Iron (Fe) deficiency anemia 08/08/2013  . Vitamin B 12 deficiency 08/08/2013  . GERD (gastroesophageal reflux  disease) 09/08/2011  . Weakness 02/22/2011  . Depression 02/22/2011  . [place] DISEASE 04/30/2009  . Vitamin D deficiency 02/05/2009  . Acute bronchitis 09/10/2008  . Venous (peripheral) insufficiency 12/12/2007  . RENAL CALCULUS 12/12/2007  . DIZZINESS 12/12/2007  . HYPERCHOLESTEROLEMIA 09/16/2007  . Anxiety state 09/16/2007  . HYPERTENSION, BENIGN 09/16/2007  . Allergic rhinitis 09/16/2007  . IRRITABLE BOWEL SYNDROME 09/16/2007  . Myalgia and myositis 09/16/2007    Past Medical History: Past Medical History:  Diagnosis Date  . Acute bronchitis   . Allergic rhinitis, cause unspecified   . Anemia   . Anxiety   . B12 deficiency   . BV (bacterial vaginosis) 06/22/1996  . Calculus of kidney   . Calculus of kidney   . Coronary atherosclerosis of unspecified type of vessel, native or graft   . Dizziness   . Essential hypertension, benign   . Fibroid 2003  . Fibromyalgia   . H/O dysmenorrhea 2008  . H/O varicella   . Headache(784.0)    frequently  . HSV-2 infection 2009  . Hyperplastic colon polyp 05/16/2014  . Irritable bowel syndrome   . Meniere's disease, unspecified   . Menses, irregular 2003  . Myalgia and myositis, unspecified   . Obstructive sleep apnea (adult) (pediatric)   . Perimenopausal symptoms 2003  . Pure hypercholesterolemia   . Sarcoidosis   . Type II or unspecified type diabetes mellitus without mention of complication, not stated as uncontrolled   . Unspecified venous (peripheral) insufficiency   . Vitamin D deficiency   . Vulvitis 2010  . Yeast infection  Past Surgical History: Past Surgical History:  Procedure Laterality Date  . CORONARY STENT INTERVENTION N/A 06/15/2017   Procedure: CORONARY STENT INTERVENTION;  Surgeon: Jettie Booze, MD;  Location: [facility];  Service: Cardiovascular;  Laterality: N/A;  . heart catherization    . LEFT HEART CATH AND CORONARY ANGIOGRAPHY N/A 06/15/2017   Procedure: LEFT HEART CATH AND  CORONARY ANGIOGRAPHY;  Surgeon: Jettie Booze, MD;  Location: [facility];  Service: Cardiovascular;  Laterality: N/A;  . TONSILLECTOMY      Social History: Social History   Tobacco Use  . Smoking status: Never Smoker  . Smokeless tobacco: Never Used  . Tobacco comment: Daily Caffeine - 1  Exercise 2-3 times/weekly  Substance Use Topics  . Alcohol use: No    Alcohol/week: 0.0 standard drinks  . Drug use: No   Additional social history: daughter works in [employer]".  Please also refer to relevant sections of EMR.  Family History: Family History  Adopted: Yes  Problem Relation Age of Onset  . Other Unknown        ADOPTED  . Colon cancer Neg Hx   . Esophageal cancer Neg Hx   . Rectal cancer Neg Hx   . Stomach cancer Neg Hx    Allergies and Medications: Allergies  Allergen Reactions  . Actos [Pioglitazone] Other (See Comments)    REACTION: pt states INTOL w/ edema  . Codeine Other (See Comments)    REACTION: vomiting  . Januvia [Sitagliptin] Nausea Only  . Lactose Intolerance (Gi) Diarrhea  . Morphine Nausea Only and Nausea And Vomiting    REACTION: vomiting REACTION: vomiting   No current facility-administered medications on file prior to encounter.    Current Outpatient Medications on File Prior to Encounter  Medication Sig Dispense Refill  . albuterol (PROAIR HFA) 108 (90 Base) MCG/ACT inhaler Inhale 1-2 puffs into the lungs every 6 (six) hours as needed for wheezing or shortness of breath. 1 Inhaler 3  . ALPRAZolam (XANAX) 0.5 MG tablet 1/2 morning, 1/2 in afternoon, and 1 at bedtime 60 tablet 5  . aspirin 81 MG chewable tablet Chew 1 tablet (81 mg total) by mouth daily.    [name] BRILINTA 90 MG TABS tablet TAKE 1 TABLET BY MOUTH TWICE A DAY 180 tablet 0  . CARTIA XT 240 MG 24 hr capsule TAKE 1 CAPSULE(240 MG) BY MOUTH DAILY 90 capsule 1  . Cyanocobalamin 1000 MCG/ML KIT Inject 1,000 mg as directed every 30 (thirty) days.    .  Empagliflozin-metFORMIN HCl ER (SYNJARDY XR) 25-1000 MG TB24 Take 1 tablet by mouth daily.    . fluticasone (FLONASE) 50 MCG/ACT nasal spray Place 2 sprays into both nostrils at bedtime. (Patient taking differently: Place 2 sprays into both nostrils daily as needed for allergies or rhinitis. ) 16 g 2  . Fluticasone-Salmeterol (AIRDUO RESPICLICK 239/53) 202-33 MCG/ACT AEPB Inhale 2 puffs into the lungs 2 (two) times daily. 1 each 5  . furosemide (LASIX) 20 MG tablet Take 20 mg by mouth daily as needed for fluid or edema.    [name] HP ACTHAR 80 UNIT/ML injectable gel Inject 80 Units into the skin 3 (three) times a week.     . losartan (COZAAR) 100 MG tablet TAKE 1 TABLET BY MOUTH ONCE DAILY 30 tablet 7  . metoprolol tartrate (LOPRESSOR) 50 MG tablet TAKE 1 TABLET BY MOUTH TWICE A DAY 60 tablet 6  . nitroGLYCERIN (NITROSTAT) 0.4 MG SL tablet Place 1 tablet (0.4  mg total) under the tongue every 5 (five) minutes as needed for chest pain. 25 tablet 2  . ondansetron (ZOFRAN-ODT) 4 MG disintegrating tablet Take 4 mg by mouth every 8 (eight) hours as needed for nausea or vomiting.    . pantoprazole (PROTONIX) 40 MG tablet take 1 tablet by mouth once daily 90 tablet 3  . REPATHA SURECLICK 102 MG/ML SOAJ INJECT 1 PEN INTO THE SKIN EVERY 14 (FOURTEEN) DAYS. REFRIGERATE. 2 pen 11  . traMADol (ULTRAM) 50 MG tablet Take 1 tablet (50 mg total) by mouth 3 (three) times daily as needed. 90 tablet 0  . valACYclovir (VALTREX) 500 MG tablet Take 500 mg by mouth daily.     [name] azithromycin (ZITHROMAX) 250 MG tablet Take 2 pills today then one a day for 4 additional days (Patient not taking: Reported on 08/17/2018) 6 tablet 0  . doxycycline (VIBRA-TABS) 100 MG tablet Take 1 tablet (100 mg total) by mouth 2 (two) times daily. 14 tablet 0  . methylPREDNISolone (MEDROL DOSEPAK) 4 MG TBPK tablet follow package directions (Patient not taking: Reported on 08/17/2018) 21 tablet 0  . predniSONE (DELTASONE) 10 MG tablet 1 tab twicedailyx's4  day,1 tabx4 day,1/2 tab x 4 day,1/2 tab everyother day til gone 20 tablet 0    Objective: BP (!) 131/98   Pulse 94   Resp (!) 24   Ht 5' 2"  (1.575 m)   Wt 77.8 kg   SpO2 95%   BMI 31.37 kg/m  Exam: General: well-appearing, resting comfortably in bed Neck: soft, supple, no tenderness to palpation, no lymphadenitis Cardiovascular: RRR, no murmurs appreciated Respiratory: clear to auscultation on right, diminished lung sounds on left, no wheezing, no increased WOB or accessory muscle use Chest: acutely tender to palpation along left chest from about T5-epigastric area, no rashes Back: acutely tender to palpation along left back from about T5-lumbar area, no rashes Gastrointestinal: abdomen soft, non-tender, non-distended, no masses MSK: developmentally normal Derm: no rashes, 2 solitary healing lesions on left thigh with scab <62m diameter, non infectious appearing Neuro: alert and oriented, answers questions appropriately, no focal deficits, moves all extremities equally and appropriately Psych: pleasant and cooperative  Labs and Imaging: CBC BMET  Recent Labs  Lab 08/17/18 1620  WBC 16.6*  HGB 12.2  HCT 38.1  PLT 400   Recent Labs  Lab 08/17/18 1620  NA 141  K 4.3  CL 107  CO2 22  BUN 27*  CREATININE 1.13*  GLUCOSE 232*  CALCIUM 9.3     Dg Chest 2 View  Result Date: 08/17/2018 CLINICAL DATA:  Shortness of breath and chest pain EXAM: CHEST - 2 VIEW COMPARISON:  August 16, 2018 FINDINGS: There is a sizable pleural effusion on the left, representing a significant change from 1 day prior. There is probable compressive atelectasis in the left lower lobe and lingula. A degree of consolidation superimposed cannot be excluded in these areas. The right lung is clear. Heart is mildly enlarged with pulmonary vascularity normal. No adenopathy. No bone lesions. IMPRESSION: Sizable left pleural effusion. Compressive atelectasis with questionable degree of consolidation in the  lingula and left lower lobe as well. Right clear. Stable cardiac enlargement. Electronically Signed   By: WLowella GripIII M.D.   On: 08/17/2018 16:58   Dg Chest 2 View  Result Date: 08/16/2018 CLINICAL DATA:  Short of breath, left anterior chest pain, cough EXAM: CHEST - 2 VIEW COMPARISON:  Chest x-ray of 04/12/2017 FINDINGS: There is abnormal opacity at the medial  left lung base which is not seen on the lateral view. This may represent partial atelectasis of left lower lobe. No paramediastinal mass is seen on the lateral view. No effusion is noted. CT of the chest with IV contrast media is recommended to assess further. Mild cardiomegaly is stable. No acute bony abnormality is seen. There is a partially compressed lower thoracic vertebral body, probably T9, which does appear to have occurred since the chest x-ray of 84/53/6468 and is of uncertain acuity. IMPRESSION: 1. Abnormal opacity medially at the left lung base on the frontal view, probably due to atelectasis of the left lower lobe. No abnormality is seen on the lateral view. Recommend CT of the chest with IV contrast to assess further. 2. Partially compressed T9 vertebral body has occurred since the prior chest x-ray from 2018. Correlate clinically. Electronically Signed   By: Ivar Drape M.D.   On: 08/16/2018 16:47   Ct Chest W Contrast  Result Date: 08/17/2018 CLINICAL DATA:  Left-sided chest pain EXAM: CT CHEST WITH CONTRAST TECHNIQUE: Multidetector CT imaging of the chest was performed during intravenous contrast administration. CONTRAST:  54m OMNIPAQUE IOHEXOL 300 MG/ML  SOLN COMPARISON:  Chest x-ray 08/17/2018, 08/16/2018, 05/31/2018 FINDINGS: Cardiovascular: Nonaneurysmal aorta. Left-sided aortic arch with aberrant right subclavian artery with retroesophageal course. Mild aortic atherosclerosis. Coronary artery calcification. Normal heart size. No pericardial effusion. Mediastinum/Nodes: Midline trachea. Multiple hypodense thyroid  nodules, measuring up to 11 mm in the inferior right lobe. No significant mediastinal adenopathy. Narrowed appearance of left lower lobe bronchi. Lungs/Pleura: Moderate slightly loculated left pleural effusion. Consolidation within the left lower lobe and partial consolidation in the lingula. Masslike density along the anterior aspect of the left mediastinum measuring approximately 7.8 cm cranial caudad by 5.3 cm AP by 3 cm transverse, extends to the apical portion of the left lung. Upper Abdomen: No acute abnormality. Subcentimeter hypodense lesions in the left kidney too small to further characterize. 15 mm left adrenal gland nodule, indeterminate. Musculoskeletal: No acute or suspicious osseous lesion. IMPRESSION: 1. Lobulated masslike thickening along the left mediastinal contour and extending to the left lung apex is concerning for large pleural based mass. Not certain if this represents metastatic disease or primary pleural based mass. 2. Moderate loculated left pleural effusion. Consolidation in the left lower lobe and lingula with slightly nodular appearance of consolidations at the lingula. 3. Indeterminate 1.5 cm left adrenal gland nodule 4. Left-sided aortic arch with aberrancy right subclavian artery with retroesophageal course. Aortic Atherosclerosis (ICD10-I70.0). Electronically Signed   By: KDonavan FoilM.D.   On: 08/17/2018 19:27    CDoristine MangoDO 08/17/2018, 8:59 PM PGY-1, [facility] pager: [phone] text pages welcome  FPTS Upper-Level Resident Addendum   I have independently interviewed and examined the patient. I have discussed the above with the original author and agree with their documentation. My edits for correction/addition/clarification are in blue. Please see also any attending notes.    SSherene Sires DO PGY-2, [facility] 08/18/2018 6:08 AM  FPTS Service pager: [phone](text pages welcome through [facility]

## 2018-08-17 NOTE — ED Provider Notes (Addendum)
[facility] EMERGENCY DEPARTMENT Provider Note   CSN: [id number] Arrival date & time: 08/17/18  1553     History   Chief Complaint Chief Complaint  Patient presents with  . Chest Pain    HPI Jocelyn [name] is a 62 y.o. female with medical history significant for asthma, sarcoidosis, OSA, hypertension, CAD, diabetes mellitus, hyperlipidemia, GERD and anxiety presenting for evaluation of chest pain.  She reports that this morning she began experiencing left-sided chest pain which radiates to the back.  Rates the pain at 10/10 and was typically worse with movement, breathing and cough.  At onset she experienced shortness of breath, nausea and clammy and sensation diaphoresis but denies vomiting, palpitation, dysrhythmia, fevers, chills.  She does not report of a productive cough.  She states that about a week ago she had what seems to be congestion in her chest and was given a prescription of azithromycin by her pulmonologist.  She was seen by her pulmonologist yesterday with similar complaints and was thought to have chest wall muscle spasm, soreness and tenderness.  She was prescribed prednisone and was started on doxycycline.  She was also advised to take tramadol as needed for pain.  Patient called her cardiologist this morning with ongoing chest pain and described 8/10 left-sided chest pain that was not relieved with wearing Xanax and tramadol.  While on the phone with cardiologist patient to compare nitroglycerin with no relief after 5 minutes.  Per her cardiologist, the pain was most likely noncardiac related but however was advised to report to the ER for further evaluation.   Past Medical History:  Diagnosis Date  . Acute bronchitis   . Allergic rhinitis, cause unspecified   . Anemia   . Anxiety   . B12 deficiency   . BV (bacterial vaginosis) 06/22/1996  . Calculus of kidney   . Calculus of kidney   . Coronary atherosclerosis of unspecified type of vessel,  native or graft   . Dizziness   . Essential hypertension, benign   . Fibroid 2003  . Fibromyalgia   . H/O dysmenorrhea 2008  . H/O varicella   . Headache(784.0)    frequently  . HSV-2 infection 2009  . Hyperplastic colon polyp 05/16/2014  . Irritable bowel syndrome   . Meniere's disease, unspecified   . Menses, irregular 2003  . Myalgia and myositis, unspecified   . Obstructive sleep apnea (adult) (pediatric)   . Perimenopausal symptoms 2003  . Pure hypercholesterolemia   . Sarcoidosis   . Type II or unspecified type diabetes mellitus without mention of complication, not stated as uncontrolled   . Unspecified venous (peripheral) insufficiency   . Vitamin D deficiency   . Vulvitis 2010  . Yeast infection     Patient Active Problem List   Diagnosis Date Noted  . Dyspnea 08/16/2018  . Chest wall pain 08/16/2018  . Fall on or from sidewalk curb, initial encounter 05/31/2018  . History of acute inferior wall MI 08/22/2017  . Status post coronary artery stent placement   . Diabetes mellitus ([place])   . Acute ST elevation myocardial infarction (STEMI) involving right coronary artery ([place])   . Overweight 05/05/2017  . Diabetes mellitus type 2, controlled, without complications ([place]) 44/96/7591  . Asthmatic bronchitis , chronic ([place]) 05/10/2016  . Polymyositis ([place]) 09/15/2015  . History of sarcoidosis 09/15/2015  . Falling 03/13/2015  . Edema 02/13/2014  . Coronary atherosclerosis of native coronary artery 10/29/2013  . Mixed hyperlipidemia 10/29/2013  . Upper  respiratory tract infection 08/08/2013  . Iron (Fe) deficiency anemia 08/08/2013  . Vitamin B 12 deficiency 08/08/2013  . GERD (gastroesophageal reflux disease) 09/08/2011  . Weakness 02/22/2011  . Depression 02/22/2011  . [place] DISEASE 04/30/2009  . Vitamin D deficiency 02/05/2009  . Acute bronchitis 09/10/2008  . Venous (peripheral) insufficiency 12/12/2007  . RENAL CALCULUS 12/12/2007  . DIZZINESS 12/12/2007    . HYPERCHOLESTEROLEMIA 09/16/2007  . Anxiety state 09/16/2007  . HYPERTENSION, BENIGN 09/16/2007  . Allergic rhinitis 09/16/2007  . IRRITABLE BOWEL SYNDROME 09/16/2007  . Myalgia and myositis 09/16/2007    Past Surgical History:  Procedure Laterality Date  . CORONARY STENT INTERVENTION N/A 06/15/2017   Procedure: CORONARY STENT INTERVENTION;  Surgeon: Jettie Booze, MD;  Location: [facility];  Service: Cardiovascular;  Laterality: N/A;  . heart catherization    . LEFT HEART CATH AND CORONARY ANGIOGRAPHY N/A 06/15/2017   Procedure: LEFT HEART CATH AND CORONARY ANGIOGRAPHY;  Surgeon: Jettie Booze, MD;  Location: [facility];  Service: Cardiovascular;  Laterality: N/A;  . TONSILLECTOMY       OB History    Gravida  1   Para  1   Term  1   Preterm      AB      Living  1     SAB      TAB      Ectopic      Multiple      Live Births  1            Home Medications    Prior to Admission medications   Medication Sig Start Date End Date Taking? Authorizing Provider  albuterol (PROAIR HFA) 108 (90 Base) MCG/ACT inhaler Inhale 1-2 puffs into the lungs every 6 (six) hours as needed for wheezing or shortness of breath. 02/20/18  Yes Noralee Space, MD  ALPRAZolam Duanne Moron) 0.5 MG tablet 1/2 morning, 1/2 in afternoon, and 1 at bedtime 05/30/18  Yes Noralee Space, MD  aspirin 81 MG chewable tablet Chew 1 tablet (81 mg total) by mouth daily. 06/18/17  Yes Simmons, Brittainy M, PA-C  BRILINTA 90 MG TABS tablet TAKE 1 TABLET BY MOUTH TWICE A DAY 07/18/18  Yes Jettie Booze, MD  CARTIA XT 240 MG 24 hr capsule TAKE 1 CAPSULE(240 MG) BY MOUTH DAILY 07/19/18  Yes Jettie Booze, MD  Cyanocobalamin 1000 MCG/ML KIT Inject 1,000 mg as directed every 30 (thirty) days.   Yes [provider]  Empagliflozin-metFORMIN HCl ER (SYNJARDY XR) 25-1000 MG TB24 Take 1 tablet by mouth daily.   Yes [provider]  fluticasone (FLONASE) 50 MCG/ACT  nasal spray Place 2 sprays into both nostrils at bedtime. Patient taking differently: Place 2 sprays into both nostrils daily as needed for allergies or rhinitis.  12/16/16  Yes Noralee Space, MD  Fluticasone-Salmeterol (AIRDUO RESPICLICK 235/36) 144-31 MCG/ACT AEPB Inhale 2 puffs into the lungs 2 (two) times daily. 02/20/18  Yes Noralee Space, MD  furosemide (LASIX) 20 MG tablet Take 20 mg by mouth daily as needed for fluid or edema.   Yes [provider]  HP ACTHAR 80 UNIT/ML injectable gel Inject 80 Units into the skin 3 (three) times a week.  05/27/16  Yes [provider]  losartan (COZAAR) 100 MG tablet TAKE 1 TABLET BY MOUTH ONCE DAILY 07/11/18  Yes Jettie Booze, MD  metoprolol tartrate (LOPRESSOR) 50 MG tablet TAKE 1 TABLET BY MOUTH TWICE A DAY 04/25/18  Yes Lyda Jester M, PA-C  nitroGLYCERIN (NITROSTAT) 0.4 MG SL tablet Place 1 tablet (0.4 mg total) under the tongue every 5 (five) minutes as needed for chest pain. 06/18/17  Yes Rosita Fire, Brittainy M, PA-C  ondansetron (ZOFRAN-ODT) 4 MG disintegrating tablet Take 4 mg by mouth every 8 (eight) hours as needed for nausea or vomiting.   Yes [provider]  pantoprazole (PROTONIX) 40 MG tablet take 1 tablet by mouth once daily 10/31/17  Yes Nadel, Deborra Medina, MD  REPATHA SURECLICK 161 MG/ML SOAJ INJECT 1 PEN INTO THE SKIN EVERY 14 (FOURTEEN) DAYS. REFRIGERATE. 04/21/18  Yes Jettie Booze, MD  traMADol (ULTRAM) 50 MG tablet Take 1 tablet (50 mg total) by mouth 3 (three) times daily as needed. 02/20/18  Yes Noralee Space, MD  valACYclovir (VALTREX) 500 MG tablet Take 500 mg by mouth daily.  10/01/13  Yes [provider]  azithromycin (ZITHROMAX) 250 MG tablet Take 2 pills today then one a day for 4 additional days Patient not taking: Reported on 08/17/2018 08/11/18   Noralee Space, MD  doxycycline (VIBRA-TABS) 100 MG tablet Take 1 tablet (100 mg total) by mouth 2 (two) times daily. 08/16/18   Noralee Space,  MD  methylPREDNISolone (MEDROL DOSEPAK) 4 MG TBPK tablet follow package directions Patient not taking: Reported on 08/17/2018 08/11/18   Noralee Space, MD  predniSONE (DELTASONE) 10 MG tablet 1 tab twicedailyx's4 day,1 tabx4 day,1/2 tab x 4 day,1/2 tab everyother day til gone 08/16/18   Noralee Space, MD    Family History Family History  Adopted: Yes  Problem Relation Age of Onset  . Other Unknown        ADOPTED  . Colon cancer Neg Hx   . Esophageal cancer Neg Hx   . Rectal cancer Neg Hx   . Stomach cancer Neg Hx     Social History Social History   Tobacco Use  . Smoking status: Never Smoker  . Smokeless tobacco: Never Used  . Tobacco comment: Daily Caffeine - 1  Exercise 2-3 times/weekly  Substance Use Topics  . Alcohol use: No    Alcohol/week: 0.0 standard drinks  . Drug use: No     Allergies   Actos [pioglitazone]; Codeine; Januvia [sitagliptin]; Lactose intolerance (gi); and Morphine   Review of Systems Review of Systems  Constitutional: Positive for appetite change and diaphoresis. Negative for chills, fatigue and fever.  HENT: Negative.   Eyes: Negative.   Respiratory: Positive for cough and chest tightness. Negative for shortness of breath and wheezing.   Cardiovascular: Positive for chest pain. Negative for palpitations and leg swelling.  Gastrointestinal: Negative.   Musculoskeletal: Negative.   Skin: Positive for wound ("insect bite on her left lateral thigh").  Neurological: Positive for light-headedness. Negative for weakness, numbness and headaches.  Psychiatric/Behavioral: Negative.      Physical Exam Updated Vital Signs BP (!) 131/98   Pulse 94   Resp (!) 24   Ht 5' 2"  (1.575 m)   Wt 77.8 kg   SpO2 95%   BMI 31.37 kg/m   Physical Exam  Constitutional: She appears well-developed and well-nourished.  Non-toxic appearance. She does not appear ill. No distress.  HENT:  Head: Normocephalic and atraumatic.  Cardiovascular: Regular rhythm and  normal pulses. Exam reveals no gallop, no S3, no S4, no distant heart sounds and no friction rub.  No murmur heard. Pulmonary/Chest: Effort normal. No respiratory distress. She has decreased breath sounds. She has no wheezes. She  has no rhonchi. She has no rales. Chest wall is dull to percussion. She exhibits tenderness.  Decreased breath sounds posteriorly at the left lower lobe with dullness to percussion  Musculoskeletal:  Left-sided chest wall tenderness  Skin: Skin is warm. No pallor.  Psychiatric: She has a normal mood and affect. Her speech is normal and behavior is normal. Thought content normal. Cognition and memory are normal.     ED Treatments / Results  Labs (all labs ordered are listed, but only abnormal results are displayed) Labs Reviewed  BASIC METABOLIC PANEL - Abnormal; Notable for the following components:      Result Value   Glucose, Bld 232 (*)    BUN 27 (*)    Creatinine, Ser 1.13 (*)    GFR calc non Af Amer 51 (*)    GFR calc Af Amer 59 (*)    All other components within normal limits  CBC - Abnormal; Notable for the following components:   WBC 16.6 (*)    All other components within normal limits  I-STAT TROPONIN, ED    EKG EKG Interpretation  Date/Time:  Thursday August 17 2018 16:17:13 EDT Ventricular Rate:  109 PR Interval:  152 QRS Duration: 84 QT Interval:  318 QTC Calculation: 428 R Axis:   -4 Text Interpretation:  Sinus tachycardia Moderate voltage criteria for LVH, may be normal variant Abnormal ECG No significant change was found Confirmed by Jola Schmidt [phone]) on 08/17/2018 6:18:41 PM   Radiology Dg Chest 2 View  Result Date: 08/17/2018 CLINICAL DATA:  Shortness of breath and chest pain EXAM: CHEST - 2 VIEW COMPARISON:  August 16, 2018 FINDINGS: There is a sizable pleural effusion on the left, representing a significant change from 1 day prior. There is probable compressive atelectasis in the left lower lobe and lingula. A degree of  consolidation superimposed cannot be excluded in these areas. The right lung is clear. Heart is mildly enlarged with pulmonary vascularity normal. No adenopathy. No bone lesions. IMPRESSION: Sizable left pleural effusion. Compressive atelectasis with questionable degree of consolidation in the lingula and left lower lobe as well. Right clear. Stable cardiac enlargement. Electronically Signed   By: Lowella Grip III M.D.   On: 08/17/2018 16:58   Dg Chest 2 View  Result Date: 08/16/2018 CLINICAL DATA:  Short of breath, left anterior chest pain, cough EXAM: CHEST - 2 VIEW COMPARISON:  Chest x-ray of 04/12/2017 FINDINGS: There is abnormal opacity at the medial left lung base which is not seen on the lateral view. This may represent partial atelectasis of left lower lobe. No paramediastinal mass is seen on the lateral view. No effusion is noted. CT of the chest with IV contrast media is recommended to assess further. Mild cardiomegaly is stable. No acute bony abnormality is seen. There is a partially compressed lower thoracic vertebral body, probably T9, which does appear to have occurred since the chest x-ray of 37/16/9678 and is of uncertain acuity. IMPRESSION: 1. Abnormal opacity medially at the left lung base on the frontal view, probably due to atelectasis of the left lower lobe. No abnormality is seen on the lateral view. Recommend CT of the chest with IV contrast to assess further. 2. Partially compressed T9 vertebral body has occurred since the prior chest x-ray from 2018. Correlate clinically. Electronically Signed   By: Ivar Drape M.D.   On: 08/16/2018 16:47   Ct Chest W Contrast  Result Date: 08/17/2018 CLINICAL DATA:  Left-sided chest pain EXAM: CT CHEST WITH  CONTRAST TECHNIQUE: Multidetector CT imaging of the chest was performed during intravenous contrast administration. CONTRAST:  40m OMNIPAQUE IOHEXOL 300 MG/ML  SOLN COMPARISON:  Chest x-ray 08/17/2018, 08/16/2018, 05/31/2018 FINDINGS:  Cardiovascular: Nonaneurysmal aorta. Left-sided aortic arch with aberrant right subclavian artery with retroesophageal course. Mild aortic atherosclerosis. Coronary artery calcification. Normal heart size. No pericardial effusion. Mediastinum/Nodes: Midline trachea. Multiple hypodense thyroid nodules, measuring up to 11 mm in the inferior right lobe. No significant mediastinal adenopathy. Narrowed appearance of left lower lobe bronchi. Lungs/Pleura: Moderate slightly loculated left pleural effusion. Consolidation within the left lower lobe and partial consolidation in the lingula. Masslike density along the anterior aspect of the left mediastinum measuring approximately 7.8 cm cranial caudad by 5.3 cm AP by 3 cm transverse, extends to the apical portion of the left lung. Upper Abdomen: No acute abnormality. Subcentimeter hypodense lesions in the left kidney too small to further characterize. 15 mm left adrenal gland nodule, indeterminate. Musculoskeletal: No acute or suspicious osseous lesion. IMPRESSION: 1. Lobulated masslike thickening along the left mediastinal contour and extending to the left lung apex is concerning for large pleural based mass. Not certain if this represents metastatic disease or primary pleural based mass. 2. Moderate loculated left pleural effusion. Consolidation in the left lower lobe and lingula with slightly nodular appearance of consolidations at the lingula. 3. Indeterminate 1.5 cm left adrenal gland nodule 4. Left-sided aortic arch with aberrancy right subclavian artery with retroesophageal course. Aortic Atherosclerosis (ICD10-I70.0). Electronically Signed   By: KDonavan FoilM.D.   On: 08/17/2018 19:27    Procedures Procedures (including critical care time)  Medications Ordered in ED Medications  HYDROmorphone (DILAUDID) injection 1 mg (has no administration in time range)  cefTRIAXone (ROCEPHIN) 1 g in sodium chloride 0.9 % 100 mL IVPB (0 g Intravenous Stopped 08/17/18 2006)   azithromycin (ZITHROMAX) 500 mg in sodium chloride 0.9 % 250 mL IVPB (500 mg Intravenous New Bag/Given 08/17/18 1830)  iohexol (OMNIPAQUE) 300 MG/ML solution 75 mL (75 mLs Intravenous Contrast Given 08/17/18 1841)  acetaminophen (TYLENOL) tablet 325 mg (325 mg Oral Given 08/17/18 2022)     Initial Impression / Assessment and Plan / ED Course  I have reviewed the triage vital signs and the nursing notes.  Pertinent labs & imaging results that were available during my care of the patient were reviewed by me and considered in my medical decision making (see chart for details).    62year old woman with asthma, sarcoidosis, OSA, hypertension, CAD, diabetes mellitus, hyperlipidemia, GERD and anxiety presenting with left-sided pleuritic chest pain and nonproductive cough.  Blood pressure is within normal limits, tachypneic to 24, normal heart rate, O2 saturation of 97% on ambient air.  Physical exam noticeable for decreased breath sounds posteriorly at the left lower lobe and dullness to percussion.  EKG reveals sinus tachycardia with no ST abnormalities. Labs significant for leukocytosis of 16.6  Chest x-ray reveals left-sided pleural effusion with questionable consolidation in the lingula and the left lower lobe.  Follow-up CT chest with contrast reviewed lobulated masslike thickening along the left mediastinal contour extending to the left lung apex that is concerning for large pleural based mass, moderate loculated left pleural effusion and consolidation in the left lower lobe.   Will empirically treat patient for community-acquired pneumonia with parapneumonic infection and start ceftriaxone and azithromycin.   [facility] and require thoracocentesis with pleural fluid analysis and possible cytology.    Final Clinical Impressions(s) /  ED Diagnoses   Final diagnoses:  Community acquired pneumonia of left lower lobe of lung ([place])    Pleural effusion    ED Discharge Orders    None       Jean Rosenthal, MD 08/17/18 2050    Jean Rosenthal, MD 08/17/18 2115    Jola Schmidt, MD 08/18/18 [phone]

## 2018-08-17 NOTE — Telephone Encounter (Signed)
Pt c/o of Chest Pain: STAT if CP now or developed within 24 hours  1. Are you having CP right now? Chest pain and chest discomfort  2. Are you experiencing any other symptoms (ex. SOB, nausea, vomiting, sweating)?SOB-now  3. How long have you been experiencing CP? Last weekend it started like a congestion for Bronchitis- went to Pulmonary doctor yesterday*  4. Is your CP continuous or coming and going? This Morning- constant 5. Have you taken Nitroglycerin? no ?

## 2018-08-17 NOTE — Telephone Encounter (Signed)
Pt calls in reporting constant CP on the left side.  States pain is 8 out of 10. She took her morning Xanax.   Took Tramadol about 9 am. Pt took nitro while on phone with me.  Pain unrelieved after 5 min. Pt seen by Pulmonary yesterday, abx & prednisone started.  Discussed w/ Dr. Marlou Porch in our office. Although we don't feel this is cardiac CP, pt advised to go to ER for evaluation knowing there are many reasons she could be experiencing this. Pt agreeable to plan.

## 2018-08-17 NOTE — Progress Notes (Signed)
Family Medicine Teaching Service Daily Progress Note Intern Pager: [phone]  Patient name: Jocelyn [name] Medical record number: [id number] Date of birth: [birth year] Age: 62 y.o. Gender: female  Primary Care Provider: Noralee Space, MD Consultants: Pulmonology Code Status: Full code  Pt Overview and Major Events to Date:  08/17/2018 Admitted 08/18/2018 Thoracentesis Hospital Day: 2   Assessment and Plan: Jocelyn [name] is a 62 y.o. female presenting with left loculated pleural effusion and LLL and lingula consolidation, with atelectasis. PMH is significant for sarcoidosis, CAD s/p PCI w/stent placement, DM, hyperlipidemia, asthma, GERD, OSA.  Pleural effusion- presents with acute onset left sided chest pain with increased WBC 16.6 and cough productive of yellow/green sputum. CXR and chest CT significant for significant loculated pleural effusion on left side with consolidation of lower left lobe and lingula and significant atelectasis. Significant worsening from prior CXR at pulmonology appointment yesterday. Pulmonology consulted. Started on IV ceftriaxone and azithromycin. Overnight patient remained hemodynamically stable on RA and afebrile. Patient appears well and non-toxic. Chest CT also significant for mass like density along anterior aspect of the left mediastinum that extends up to apical portion of left lung. Not clear if this pleural effusion is secondary to a neoplasm (primary or metastatic) vs infection.  - Transfer to inpatient status - Pulmonary consulted - appreciate recs  - Thoracentesis scheduled and fluid analysis ordered  - Follow-up chest x-ray ordered  - Repeat CT without contrast s/p thoracentesis to re-evaluate lung mass -Follow-up on fluid analysis - Follow-up on CXR -Follow-up on CT chest without contrast  - Continue IV Ceftriaxone and azithromycin  - Will consider stopping once fluid analysis returns - Respiratory therapy consulted for metnebs/CT PT - continuous  cardiac monitoring and pulse ox - incentive spirometer - IV dilaudid PRN pain - Home tramadol PRN - tylenol PRN - Home flonase PRN - zofran PRN - miralax daily - Will update her pulmonologist  Anxiety: Patient prescribed Xanax 5mg  PRN. She notes using it nightly to help with sleep and as needed for situational anxiety and her vertigo. - Home Xanax 5mg  PRN  CAD s/p stent: Troponins neg x 3. EKG no ischemia. Repeat EKG negative. Patient is without chest pain today. -continue home meds             -ASA             -diltiazem -Hold Brilenta for now - continuous cardiac monitoring  HTN- hypertensive on admission as high as 178/98. BP overnight: 106-269'S systolic and 85-46'E diastolic. BMP at admission showed Cr. 1.13 > 0.99 (last Cr 08/2018 0.93). - continue home meds             -metoprolol tartrate  -losartan 100mg              -nitro SL - hydralazine PRN - Will continue to monitor BP and kidney function  DM- on home metformin/empagaflozin.  Overnight BS [id number]. Hemoglobin A1C: pending (last documed >6.8 in 2019). Home meds held due to [place] on admission - Follow up Hemoglobin A1C - hold home meds - CBG checks - SSI  Sarcoidosis: Stable No active disease x 30 years  Patient on AirDuo BID, rescue inhaler, and Flonase. - albuterol PRN - Breo-ellipta daily  OSA- patient denies being prescribed a CPAP after sleep study -Will continue to monitor ON vitals  GERD -pantoprazole PRN   Fluids:  . azithromycin    . cefTRIAXone (ROCEPHIN)  IV    Electrolytes: Replace PRN Nutrition: Heart healthy/carb  modified diet GI ppx: Pantoprazole DVT ppx: SCDs  Future labs: none Disposition: home pending improvement   Medications: Scheduled Meds: . aspirin  81 mg Oral Daily  . diltiazem  240 mg Oral Daily  . fluticasone furoate-vilanterol  1 puff Inhalation Daily  . insulin aspart  0-9 Units Subcutaneous TID WC  . losartan  100 mg Oral Daily  . metoprolol  tartrate  50 mg Oral BID  . pantoprazole  40 mg Oral Daily  . polyethylene glycol  17 g Oral Daily   Continuous Infusions: . azithromycin    . cefTRIAXone (ROCEPHIN)  IV     PRN Meds: acetaminophen, albuterol, ALPRAZolam, fluticasone, hydrALAZINE, HYDROmorphone (DILAUDID) injection, nitroGLYCERIN, ondansetron, traMADol  ================================================= ================================================= Subjective:  Patient reports doing well overnight. Pt has had good appetite and normal voids and bowel movements. Patient only complains of some pain along left side of chest and a little SOB. Denies any current chest pain. Cough has improved some. Denies nausea, vomiting, abdominal pain.   Objective: Vital Signs Temp:  [97.7 F (36.5 C)-98.2 F (36.8 C)] 98.2 F (36.8 C) (10/04 0818) Pulse Rate:  [80-101] 85 (10/04 0818) Resp:  [16-24] 18 (10/03 2358) BP: (98-178)/(82-98) 125/83 (10/04 0818) SpO2:  [91 %-100 %] 96 % (10/04 0827) Weight:  [77.8 kg-78.2 kg] 78.2 kg (10/03 2156)  Intake/Output No intake/output data recorded.  Physical Exam:  Gen: NAD, alert, non-toxic, well-appearing, sitting comfortably  Skin: Warm and dry. No obvious rashes, lesions, or trauma. HEENT: NCAT No conjunctival pallor or injection. No scleral icterus or injection.  MMM.  CV: RRR.  <2s capillary refill bilaterally.  RP & DPs 2+ bilaterally. No bilateral LE edema Resp: Decreased breat sounds on the left. No wheezing, rales, abnormal lung sounds.  No increased WOB Abd: NTND on palpation to all 4 quadrants.  Positive bowel sounds. Psych: Cooperative with exam. Pleasant. Makes eye contact. Speech normal. Extremities: Moves all extremities spontaneously   Laboratory: Recent Labs  Lab 08/16/18 1350 08/17/18 1620  WBC 11.0* 16.6*  HGB 14.6 12.2  HCT 43.6 38.1  PLT 434.0* 400   Recent Labs  Lab 08/16/18 1350 08/17/18 1620 08/18/18 0440 08/18/18 1207  NA 141 141 140  --   K 3.9  4.3 4.0  --   CL 104 107 104  --   CO2 25 22 21*  --   BUN 26* 27* 24*  --   CREATININE 0.93 1.13* 0.99  --   CALCIUM 10.0 9.3 8.6*  --   PROT  --   --   --  6.0*  GLUCOSE 123* 232* 157*  --    Imaging/Diagnostic Tests: Dg Chest 2 View  Result Date: 08/17/2018 CLINICAL DATA:  Shortness of breath and chest pain EXAM: CHEST - 2 VIEW COMPARISON:  August 16, 2018 FINDINGS: There is a sizable pleural effusion on the left, representing a significant change from 1 day prior. There is probable compressive atelectasis in the left lower lobe and lingula. A degree of consolidation superimposed cannot be excluded in these areas. The right lung is clear. Heart is mildly enlarged with pulmonary vascularity normal. No adenopathy. No bone lesions. IMPRESSION: Sizable left pleural effusion. Compressive atelectasis with questionable degree of consolidation in the lingula and left lower lobe as well. Right clear. Stable cardiac enlargement. Electronically Signed   By: Lowella Grip III M.D.   On: 08/17/2018 16:58   Dg Chest 2 View  Result Date: 08/16/2018 CLINICAL DATA:  Short of breath, left anterior chest pain,  cough EXAM: CHEST - 2 VIEW COMPARISON:  Chest x-ray of 04/12/2017 FINDINGS: There is abnormal opacity at the medial left lung base which is not seen on the lateral view. This may represent partial atelectasis of left lower lobe. No paramediastinal mass is seen on the lateral view. No effusion is noted. CT of the chest with IV contrast media is recommended to assess further. Mild cardiomegaly is stable. No acute bony abnormality is seen. There is a partially compressed lower thoracic vertebral body, probably T9, which does appear to have occurred since the chest x-ray of 61/44/3154 and is of uncertain acuity. IMPRESSION: 1. Abnormal opacity medially at the left lung base on the frontal view, probably due to atelectasis of the left lower lobe. No abnormality is seen on the lateral view. Recommend CT of the  chest with IV contrast to assess further. 2. Partially compressed T9 vertebral body has occurred since the prior chest x-ray from 2018. Correlate clinically. Electronically Signed   By: Ivar Drape M.D.   On: 08/16/2018 16:47   Ct Chest W Contrast  Result Date: 08/17/2018 CLINICAL DATA:  Left-sided chest pain EXAM: CT CHEST WITH CONTRAST TECHNIQUE: Multidetector CT imaging of the chest was performed during intravenous contrast administration. CONTRAST:  45mL OMNIPAQUE IOHEXOL 300 MG/ML  SOLN COMPARISON:  Chest x-ray 08/17/2018, 08/16/2018, 05/31/2018 FINDINGS: Cardiovascular: Nonaneurysmal aorta. Left-sided aortic arch with aberrant right subclavian artery with retroesophageal course. Mild aortic atherosclerosis. Coronary artery calcification. Normal heart size. No pericardial effusion. Mediastinum/Nodes: Midline trachea. Multiple hypodense thyroid nodules, measuring up to 11 mm in the inferior right lobe. No significant mediastinal adenopathy. Narrowed appearance of left lower lobe bronchi. Lungs/Pleura: Moderate slightly loculated left pleural effusion. Consolidation within the left lower lobe and partial consolidation in the lingula. Masslike density along the anterior aspect of the left mediastinum measuring approximately 7.8 cm cranial caudad by 5.3 cm AP by 3 cm transverse, extends to the apical portion of the left lung. Upper Abdomen: No acute abnormality. Subcentimeter hypodense lesions in the left kidney too small to further characterize. 15 mm left adrenal gland nodule, indeterminate. Musculoskeletal: No acute or suspicious osseous lesion. IMPRESSION: 1. Lobulated masslike thickening along the left mediastinal contour and extending to the left lung apex is concerning for large pleural based mass. Not certain if this represents metastatic disease or primary pleural based mass. 2. Moderate loculated left pleural effusion. Consolidation in the left lower lobe and lingula with slightly nodular appearance  of consolidations at the lingula. 3. Indeterminate 1.5 cm left adrenal gland nodule 4. Left-sided aortic arch with aberrancy right subclavian artery with retroesophageal course. Aortic Atherosclerosis (ICD10-I70.0). Electronically Signed   By: Donavan Foil M.D.   On: 08/17/2018 19:27   Dg Chest Port 1 View  Result Date: 08/18/2018 CLINICAL DATA:  62 year old female status post left side thoracentesis. Left pleural effusion with evidence of pleural based mass on chest CT yesterday. EXAM: PORTABLE CHEST 1 VIEW COMPARISON:  Chest CT 08/17/2018 and earlier. FINDINGS: Portable AP upright view at 1210 hours. Left pleural effusion volume appears regressed since the CT yesterday. No pneumothorax identified. Continued dense left lung base opacification. The right lung remains clear allowing for portable technique. Stable visible mediastinal contours. Visualized tracheal air column is within normal limits. Negative visible bowel gas pattern. IMPRESSION: No pneumothorax or new cardiopulmonary abnormality status post left side thoracentesis. Electronically Signed   By: Genevie Ann M.D.   On: 08/18/2018 12:27    Danna Hefty, DO 08/18/2018, 1:46 PM PGY-1, [facility] Intern pager: [phone], text pages welcome

## 2018-08-17 NOTE — ED Triage Notes (Signed)
Pt here from home with c/o  Left sided chest pain that started this morning radiating to her back , also c/o sob and nausea

## 2018-08-18 ENCOUNTER — Other Ambulatory Visit: Payer: Self-pay

## 2018-08-18 ENCOUNTER — Observation Stay (HOSPITAL_COMMUNITY): Payer: BC Managed Care – PPO

## 2018-08-18 ENCOUNTER — Encounter (HOSPITAL_COMMUNITY): Payer: Self-pay

## 2018-08-18 ENCOUNTER — Inpatient Hospital Stay (HOSPITAL_COMMUNITY): Payer: BC Managed Care – PPO

## 2018-08-18 ENCOUNTER — Telehealth: Payer: Self-pay | Admitting: Pulmonary Disease

## 2018-08-18 DIAGNOSIS — D869 Sarcoidosis, unspecified: Secondary | ICD-10-CM | POA: Diagnosis not present

## 2018-08-18 DIAGNOSIS — J90 Pleural effusion, not elsewhere classified: Secondary | ICD-10-CM | POA: Diagnosis present

## 2018-08-18 DIAGNOSIS — F411 Generalized anxiety disorder: Secondary | ICD-10-CM | POA: Diagnosis present

## 2018-08-18 DIAGNOSIS — K219 Gastro-esophageal reflux disease without esophagitis: Secondary | ICD-10-CM | POA: Diagnosis present

## 2018-08-18 DIAGNOSIS — R918 Other nonspecific abnormal finding of lung field: Secondary | ICD-10-CM | POA: Diagnosis not present

## 2018-08-18 DIAGNOSIS — I872 Venous insufficiency (chronic) (peripheral): Secondary | ICD-10-CM | POA: Diagnosis present

## 2018-08-18 DIAGNOSIS — I252 Old myocardial infarction: Secondary | ICD-10-CM | POA: Diagnosis not present

## 2018-08-18 DIAGNOSIS — F329 Major depressive disorder, single episode, unspecified: Secondary | ICD-10-CM | POA: Diagnosis present

## 2018-08-18 DIAGNOSIS — J181 Lobar pneumonia, unspecified organism: Secondary | ICD-10-CM | POA: Diagnosis not present

## 2018-08-18 DIAGNOSIS — Z9889 Other specified postprocedural states: Secondary | ICD-10-CM | POA: Diagnosis not present

## 2018-08-18 DIAGNOSIS — N179 Acute kidney failure, unspecified: Secondary | ICD-10-CM | POA: Diagnosis present

## 2018-08-18 DIAGNOSIS — M797 Fibromyalgia: Secondary | ICD-10-CM | POA: Diagnosis present

## 2018-08-18 DIAGNOSIS — Z7902 Long term (current) use of antithrombotics/antiplatelets: Secondary | ICD-10-CM | POA: Diagnosis not present

## 2018-08-18 DIAGNOSIS — R079 Chest pain, unspecified: Secondary | ICD-10-CM | POA: Diagnosis not present

## 2018-08-18 DIAGNOSIS — M3320 Polymyositis, organ involvement unspecified: Secondary | ICD-10-CM | POA: Diagnosis present

## 2018-08-18 DIAGNOSIS — Z79891 Long term (current) use of opiate analgesic: Secondary | ICD-10-CM | POA: Diagnosis not present

## 2018-08-18 DIAGNOSIS — K589 Irritable bowel syndrome without diarrhea: Secondary | ICD-10-CM | POA: Diagnosis present

## 2018-08-18 DIAGNOSIS — E119 Type 2 diabetes mellitus without complications: Secondary | ICD-10-CM | POA: Diagnosis present

## 2018-08-18 DIAGNOSIS — E785 Hyperlipidemia, unspecified: Secondary | ICD-10-CM | POA: Diagnosis not present

## 2018-08-18 DIAGNOSIS — I2510 Atherosclerotic heart disease of native coronary artery without angina pectoris: Secondary | ICD-10-CM | POA: Diagnosis present

## 2018-08-18 DIAGNOSIS — Z8709 Personal history of other diseases of the respiratory system: Secondary | ICD-10-CM

## 2018-08-18 DIAGNOSIS — E782 Mixed hyperlipidemia: Secondary | ICD-10-CM | POA: Diagnosis present

## 2018-08-18 DIAGNOSIS — Z7982 Long term (current) use of aspirin: Secondary | ICD-10-CM | POA: Diagnosis not present

## 2018-08-18 DIAGNOSIS — J9811 Atelectasis: Secondary | ICD-10-CM | POA: Diagnosis present

## 2018-08-18 DIAGNOSIS — J189 Pneumonia, unspecified organism: Secondary | ICD-10-CM | POA: Diagnosis not present

## 2018-08-18 DIAGNOSIS — J942 Hemothorax: Secondary | ICD-10-CM | POA: Diagnosis present

## 2018-08-18 DIAGNOSIS — I10 Essential (primary) hypertension: Secondary | ICD-10-CM | POA: Diagnosis present

## 2018-08-18 DIAGNOSIS — E559 Vitamin D deficiency, unspecified: Secondary | ICD-10-CM | POA: Diagnosis present

## 2018-08-18 DIAGNOSIS — R9389 Abnormal findings on diagnostic imaging of other specified body structures: Secondary | ICD-10-CM | POA: Diagnosis not present

## 2018-08-18 DIAGNOSIS — H8109 Meniere's disease, unspecified ear: Secondary | ICD-10-CM | POA: Diagnosis present

## 2018-08-18 DIAGNOSIS — J309 Allergic rhinitis, unspecified: Secondary | ICD-10-CM | POA: Diagnosis present

## 2018-08-18 DIAGNOSIS — G4733 Obstructive sleep apnea (adult) (pediatric): Secondary | ICD-10-CM | POA: Diagnosis present

## 2018-08-18 LAB — HIV ANTIBODY (ROUTINE TESTING W REFLEX): HIV SCREEN 4TH GENERATION: NONREACTIVE

## 2018-08-18 LAB — BODY FLUID CELL COUNT WITH DIFFERENTIAL
Eos, Fluid: 1 %
Lymphs, Fluid: 26 %
Monocyte-Macrophage-Serous Fluid: 10 % — ABNORMAL LOW (ref 50–90)
Neutrophil Count, Fluid: 63 % — ABNORMAL HIGH (ref 0–25)
Total Nucleated Cell Count, Fluid: 4086 cu mm — ABNORMAL HIGH (ref 0–1000)

## 2018-08-18 LAB — BASIC METABOLIC PANEL
Anion gap: 15 (ref 5–15)
BUN: 24 mg/dL — ABNORMAL HIGH (ref 8–23)
CALCIUM: 8.6 mg/dL — AB (ref 8.9–10.3)
CO2: 21 mmol/L — ABNORMAL LOW (ref 22–32)
Chloride: 104 mmol/L (ref 98–111)
Creatinine, Ser: 0.99 mg/dL (ref 0.44–1.00)
GFR, EST NON AFRICAN AMERICAN: 59 mL/min — AB (ref >60)
Glucose, Bld: 157 mg/dL — ABNORMAL HIGH (ref 70–99)
Potassium: 4 mmol/L (ref 3.5–5.1)
SODIUM: 140 mmol/L (ref 135–145)

## 2018-08-18 LAB — TROPONIN I: Troponin I: <0.03 ng/mL (ref <0.03)

## 2018-08-18 LAB — PROTEIN, TOTAL: Total Protein: 6 g/dL — ABNORMAL LOW (ref 6.5–8.1)

## 2018-08-18 LAB — GLUCOSE, CAPILLARY
GLUCOSE-CAPILLARY: 116 mg/dL — AB (ref 70–99)
Glucose-Capillary: 121 mg/dL — ABNORMAL HIGH (ref 70–99)
Glucose-Capillary: 116 mg/dL — ABNORMAL HIGH (ref 70–99)
Glucose-Capillary: 178 mg/dL — ABNORMAL HIGH (ref 70–99)

## 2018-08-18 LAB — CHOLESTEROL, TOTAL: Cholesterol: 191 mg/dL (ref 0–200)

## 2018-08-18 LAB — LACTATE DEHYDROGENASE: LDH: 160 U/L (ref 98–192)

## 2018-08-18 MED ORDER — ALPRAZOLAM 0.5 MG PO TABS: 0.5000 mg | ORAL_TABLET | Freq: Every evening | ORAL | Status: DC | PRN

## 2018-08-18 MED ORDER — TICAGRELOR 90 MG PO TABS: 90.0000 mg | ORAL_TABLET | Freq: Two times a day (BID) | ORAL | Status: DC

## 2018-08-18 MED ORDER — LOSARTAN POTASSIUM 50 MG PO TABS
100.0000 mg | ORAL_TABLET | Freq: Every day | ORAL | Status: DC
  Administered 2018-08-18 – 2018-08-21 (×4): 100 mg via ORAL
  Filled 2018-08-18 (×4): qty 2

## 2018-08-18 NOTE — Telephone Encounter (Signed)
SN is aware of admission.

## 2018-08-18 NOTE — Consult Note (Signed)
NAME:  Jocelyn [name], MRN:  [id number], DOB:  10/18/1956, LOS: 0 ADMISSION DATE:  08/17/2018, CONSULTATION DATE:  08/18/2018 REFERRING MD:  Docia Barrier - Hensel, CHIEF COMPLAINT:  Pleural effusion   Brief History   62 year old female non-smoker presenting with 2 days of chest pain.  Chest CT was done that I reviewed myself with pleural effusion noted.  No fever but cough productive of yellow/green sputum.  No weight loss and no hemoptysis.  Kindergarten teacher retired.  No sick contact.  Past Medical History  DM HTN CAD s/p stent placement  Significant Hospital Events   10/3 admission to the hospital  Consults: date of consult/date signed off & final recs:  N/A  Procedures (surgical and bedside):  Thora 10/4>>>  Significant Diagnostic Tests:  Thora 10/4>>>  Micro Data:  Pleural fluid 10/4>>>  Antimicrobials:  Zithromax 10/3>>> Rocephin 10/3>>>  Subjective:  No events, no new complaints but chest pain  Objective   Blood pressure 125/83, pulse 85, temperature 98.2 F (36.8 C), temperature source Oral, resp. rate 18, height 5' 2"  (1.575 m), weight 78.2 kg, SpO2 96 %.       No intake or output data in the 24 hours ending 08/18/18 1120 Filed Weights   08/17/18 1809 08/17/18 2156  Weight: 77.8 kg 78.2 kg    Examination: General: Well appearing, NAD HENT: [place]/AT, PERRL, EOM-I and MMM Lungs: Decreased BS on the left Cardiovascular: RRR, Nl S1/S2 and -M/R/G Abdomen: Soft, NT, ND and +BS Extremities: -edema and -tenderness Neuro: Moving all ext to command Skin: Intact  Resolved Hospital Problem list   N/A  Assessment & Plan:  62 year old female with CP and pleural effusion.  Discussed with PCCM-NP.  Pleural effusion:  - Thora today  - Fluid analysis  CP:  - NSAIDS  Hemothorax:  - ?if can hold brilinta, primary team to discuss with cards  ?lung mass:  - Repeat CT post thora without contrast  Disposition / Summary of Today's Plan 08/18/18    Labs    CBC: Recent Labs  Lab 08/16/18 1350 08/17/18 1620  WBC 11.0* 16.6*  NEUTROABS 8.4*  --   HGB 14.6 12.2  HCT 43.6 38.1  MCV 90.7 95.3  PLT 434.0* 371    Basic Metabolic Panel: Recent Labs  Lab 08/16/18 1350 08/17/18 1620 08/18/18 0440  NA 141 141 140  K 3.9 4.3 4.0  CL 104 107 104  CO2 25 22 21*  GLUCOSE 123* 232* 157*  BUN 26* 27* 24*  CREATININE 0.93 1.13* 0.99  CALCIUM 10.0 9.3 8.6*   GFR: Estimated Creatinine Clearance: 56.3 mL/min (by C-G formula based on SCr of 0.99 mg/dL). Recent Labs  Lab 08/16/18 1350 08/17/18 1620  WBC 11.0* 16.6*    Liver Function Tests: No results for input(s): AST, ALT, ALKPHOS, BILITOT, PROT, ALBUMIN in the last 168 hours. No results for input(s): LIPASE, AMYLASE in the last 168 hours. No results for input(s): AMMONIA in the last 168 hours.  ABG    Component Value Date/Time   TCO2 22 06/15/2017 1251     Coagulation Profile: No results for input(s): INR, PROTIME in the last 168 hours.  Cardiac Enzymes: Recent Labs  Lab 08/17/18 2222 08/18/18 0055 08/18/18 0440  TROPONINI <0.03 <0.03 <0.03    HbA1C: Hgb A1c MFr Bld  Date/Time Value Ref Range Status  09/08/2011 03:53 PM 6.7 (H) 4.6 - 6.5 % Final    Comment:    Glycemic Control Guidelines for People with  Diabetes:Non Diabetic:  <6%Goal of Therapy: <7%Additional Action Suggested:  >8%   06/03/2011 02:43 PM 7.2 (H) 4.6 - 6.5 % Final    Comment:    Glycemic Control Guidelines for People with Diabetes:Non Diabetic:  <6%Goal of Therapy: <7%Additional Action Suggested:  >8%     CBG: Recent Labs  Lab 08/17/18 2240 08/18/18 0817  GLUCAP 202* 116*    Admitting History of Present Illness.     Review of Systems:   Negative other than above  Past Medical History  She,  has a past medical history of Acute bronchitis, Allergic rhinitis, cause unspecified, Anemia, Anxiety, B12 deficiency, BV (bacterial vaginosis) (06/22/1996), Calculus of kidney, Calculus of kidney,  Coronary atherosclerosis of unspecified type of vessel, native or graft, Dizziness, Essential hypertension, benign, Fibroid (2003), Fibromyalgia, H/O dysmenorrhea (2008), H/O varicella, Headache(784.0), HSV-2 infection (2009), Hyperplastic colon polyp (05/16/2014), Irritable bowel syndrome, Meniere's disease, unspecified, Menses, irregular (2003), Myalgia and myositis, unspecified, Obstructive sleep apnea (adult) (pediatric), Perimenopausal symptoms (2003), Pure hypercholesterolemia, Sarcoidosis, Type II or unspecified type diabetes mellitus without mention of complication, not stated as uncontrolled, Unspecified venous (peripheral) insufficiency, Vitamin D deficiency, Vulvitis (2010), and Yeast infection.   Surgical History    Past Surgical History:  Procedure Laterality Date  . CORONARY STENT INTERVENTION N/A 06/15/2017   Procedure: CORONARY STENT INTERVENTION;  Surgeon: Jettie Booze, MD;  Location: [facility];  Service: Cardiovascular;  Laterality: N/A;  . heart catherization    . LEFT HEART CATH AND CORONARY ANGIOGRAPHY N/A 06/15/2017   Procedure: LEFT HEART CATH AND CORONARY ANGIOGRAPHY;  Surgeon: Jettie Booze, MD;  Location: [facility];  Service: Cardiovascular;  Laterality: N/A;  . TONSILLECTOMY       Social History   Social History   Socioeconomic History  . Marital status: Single    Spouse name: Not on file  . Number of children: 1  . Years of education: Not on file  . Highest education level: Not on file  Occupational History  . Not on file  Social Needs  . Financial resource strain: Not on file  . Food insecurity:    Worry: Not on file    Inability: Not on file  . Transportation needs:    Medical: Not on file    Non-medical: Not on file  Tobacco Use  . Smoking status: Never Smoker  . Smokeless tobacco: Never Used  . Tobacco comment: Daily Caffeine - 1  Exercise 2-3 times/weekly  Substance and Sexual Activity  . Alcohol use: No     Alcohol/week: 0.0 standard drinks  . Drug use: No  . Sexual activity: Yes    Birth control/protection: None  Lifestyle  . Physical activity:    Days per week: Not on file    Minutes per session: Not on file  . Stress: Not on file  Relationships  . Social connections:    Talks on phone: Not on file    Gets together: Not on file    Attends religious service: Not on file    Active member of club or organization: Not on file    Attends meetings of clubs or organizations: Not on file    Relationship status: Not on file  . Intimate partner violence:    Fear of current or ex partner: Not on file    Emotionally abused: Not on file    Physically abused: Not on file    Forced sexual activity: Not on file  Other Topics Concern  .  Not on file  Social History Narrative   Exercises 2-3 times weekly   Caffeine Use: 1 daily (tea or Pepsi)   Lives alone in a one story home.  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    ,  reports that she has never smoked. She has never used smokeless tobacco. She reports that she does not drink alcohol or use drugs.   Family History   Her family history includes Other in her unknown relative. There is no history of Colon cancer, Esophageal cancer, Rectal cancer, or Stomach cancer. She was adopted.   Allergies Allergies  Allergen Reactions  . Actos [Pioglitazone] Other (See Comments)    REACTION: pt states INTOL w/ edema  . Codeine Other (See Comments)    REACTION: vomiting  . Januvia [Sitagliptin] Nausea Only  . Lactose Intolerance (Gi) Diarrhea  . Morphine Nausea Only and Nausea And Vomiting    REACTION: vomiting REACTION: vomiting     Home Medications  Prior to Admission medications   Medication Sig Start Date End Date Taking? Authorizing Provider  albuterol (PROAIR HFA) 108 (90 Base) MCG/ACT inhaler Inhale 1-2 puffs into the lungs every 6 (six) hours as needed for wheezing or shortness of breath. 02/20/18  Yes Noralee Space, MD  ALPRAZolam Duanne Moron)  0.5 MG tablet 1/2 morning, 1/2 in afternoon, and 1 at bedtime 05/30/18  Yes Noralee Space, MD  aspirin 81 MG chewable tablet Chew 1 tablet (81 mg total) by mouth daily. 06/18/17  Yes Simmons, Brittainy M, PA-C  BRILINTA 90 MG TABS tablet TAKE 1 TABLET BY MOUTH TWICE A DAY 07/18/18  Yes Jettie Booze, MD  CARTIA XT 240 MG 24 hr capsule TAKE 1 CAPSULE(240 MG) BY MOUTH DAILY 07/19/18  Yes Jettie Booze, MD  Cyanocobalamin 1000 MCG/ML KIT Inject 1,000 mg as directed every 30 (thirty) days.   Yes [provider]  Empagliflozin-metFORMIN HCl ER (SYNJARDY XR) 25-1000 MG TB24 Take 1 tablet by mouth daily.   Yes [provider]  fluticasone (FLONASE) 50 MCG/ACT nasal spray Place 2 sprays into both nostrils at bedtime. Patient taking differently: Place 2 sprays into both nostrils daily as needed for allergies or rhinitis.  12/16/16  Yes Noralee Space, MD  Fluticasone-Salmeterol (AIRDUO RESPICLICK 993/57) 017-79 MCG/ACT AEPB Inhale 2 puffs into the lungs 2 (two) times daily. 02/20/18  Yes Noralee Space, MD  furosemide (LASIX) 20 MG tablet Take 20 mg by mouth daily as needed for fluid or edema.   Yes [provider]  HP ACTHAR 80 UNIT/ML injectable gel Inject 80 Units into the skin 3 (three) times a week.  05/27/16  Yes [provider]  losartan (COZAAR) 100 MG tablet TAKE 1 TABLET BY MOUTH ONCE DAILY 07/11/18  Yes Jettie Booze, MD  metoprolol tartrate (LOPRESSOR) 50 MG tablet TAKE 1 TABLET BY MOUTH TWICE A DAY 04/25/18  Yes Lyda Jester M, PA-C  nitroGLYCERIN (NITROSTAT) 0.4 MG SL tablet Place 1 tablet (0.4 mg total) under the tongue every 5 (five) minutes as needed for chest pain. 06/18/17  Yes Rosita Fire, Brittainy M, PA-C  ondansetron (ZOFRAN-ODT) 4 MG disintegrating tablet Take 4 mg by mouth every 8 (eight) hours as needed for nausea or vomiting.   Yes [provider]  pantoprazole (PROTONIX) 40 MG tablet take 1 tablet by mouth once daily 10/31/17  Yes  Nadel, Deborra Medina, MD  REPATHA SURECLICK 390 MG/ML SOAJ INJECT 1 PEN INTO THE SKIN EVERY 14 (FOURTEEN) DAYS. REFRIGERATE. 04/21/18  Yes Jettie Booze, MD  traMADol (ULTRAM) 50 MG tablet Take 1 tablet (50 mg total) by mouth 3 (three) times daily as needed. 02/20/18  Yes Noralee Space, MD  valACYclovir (VALTREX) 500 MG tablet Take 500 mg by mouth daily.  10/01/13  Yes [provider]  azithromycin (ZITHROMAX) 250 MG tablet Take 2 pills today then one a day for 4 additional days Patient not taking: Reported on 08/17/2018 08/11/18   Noralee Space, MD  doxycycline (VIBRA-TABS) 100 MG tablet Take 1 tablet (100 mg total) by mouth 2 (two) times daily. 08/16/18   Noralee Space, MD  methylPREDNISolone (MEDROL DOSEPAK) 4 MG TBPK tablet follow package directions Patient not taking: Reported on 08/17/2018 08/11/18   Noralee Space, MD  predniSONE (DELTASONE) 10 MG tablet 1 tab twicedailyx's4 day,1 tabx4 day,1/2 tab x 4 day,1/2 tab everyother day til gone 08/16/18   Noralee Space, MD     Rush Farmer, M.D. [facility] Pulmonary/Critical Care Medicine. Pager: [phone]. After hours pager: [phone].

## 2018-08-18 NOTE — Progress Notes (Addendum)
Family Medicine Teaching Service Daily Progress Note Intern Pager: [phone]  Patient name: Jocelyn [name] Medical record number: [id number] Date of birth: 1956/01/26 Age: 62 y.o. Gender: female  Primary Care Provider: Noralee Space, MD Consultants: pulmonology Code Status: full  Pt Overview and Major Events to Date:  08/17/2018 Admitted 08/18/2018 Thoracentesis Hospital Day: 3  Assessment and Plan:  Pleural effusion. CBC today showed improved WBC count 11.6, hgb dropped to 10.0, total protein 6.0, lactate dehydrogenase wnl 160, thoracentesis yesterday resulted in 1120mL of bloody fluid obtained. fluid results: WBC= 4,086, neutrophils 63, no organisms, culture pending, pH pending, cholesterol pending. Repeat CXRAY showed no pneumothorac or new cardiopulmonary abnormalities s/p thoracentesis. Repeat chest CT showed no change in pleural mass, improved pleural effusion but still see some fluid. -f/u pulmonology recs -f/u fluid analysis from thoracentesis - continue IV CTX and azithromax - continuous pulse ox - monitor breathing status and pain for indication of increasing effusion - continue to encourage incentive spirometer use - IV dilaudid for pain - home tramadol for pain -increased frequency to q6hr - benadryl PRN - tylenol PRN - home flonase PRN - zofran PRN - miralax daily - added heating pad and diclofenac for continued pain  CAD s/p stent troponins neg x3 - continue home ASA and diltiazem - holding home brilenta - monitor vitals  HTN- stable, WNL - continue home metoprolol tartrate, losartan 100mg , nitro SL PRN - hydralazine PRN - monitor vitals  DM- well controlled. CBGs have been in low 100's mostly. A1c 6.5 - consider restarting home metformin/empagaflozin now AKI is resolved - SSI  AKI- resolved. Creatinine 0.99 yesterday - continue to monitor  Sarcoidosis- stable - continue home breo-ellipta daily - albuterol PRN - flonase PRN  GERD -pantoprazole  PRN  Anxiety - continue home xanax 5mg  daily  FEN/GI: heart healthy, carb modified diet, saline lock PPx: SCDs  Disposition: continue med surg  Subjective:  Overnight: No acute events. Patient woke up from chest pain this morning  Today: patient reports improved pain and breathing status but is still experiencing moderate pain in left chest/shoulder/back described as a "catch" (cramping). She was sitting up and leaning forward for exam for improvement in pain. She states the tramadol helps with the pain. She states she is going to try to walk today. We talked about using incentive spirometer, walking, using heating pad, tylenol, tramadol and diclofenac topical today.  Objective: Temp:  [97.7 F (36.5 C)-98.5 F (36.9 C)] 98.4 F (36.9 C) (10/04 2251) Pulse Rate:  [82-94] 94 (10/04 2251) Resp:  [18] 18 (10/04 2251) BP: (100-126)/(73-85) 115/73 (10/04 2251) SpO2:  [91 %-96 %] 94 % (10/04 2251)  Physical Exam: General: no acute distress, sitting up and leaning forward in bed Cardiovascular: RRR, no murmur appreciated Respiratory: clear to auscultation with good air movement on right. Clear to auscultation on left but diminished lung sounds from mid lung field and inferior Derm: no rashes seen or palpation along distribution of pain Abdomen: non-tender,  Extremities: no edema, moving all equally and appropriately.  Psych: patient is calm, pleasant, and cooperative  Laboratory: Recent Labs  Lab 08/16/18 1350 08/17/18 1620  WBC 11.0* 16.6*  HGB 14.6 12.2  HCT 43.6 38.1  PLT 434.0* 400   Recent Labs  Lab 08/16/18 1350 08/17/18 1620 08/18/18 0440 08/18/18 1207  NA 141 141 140  --   K 3.9 4.3 4.0  --   CL 104 107 104  --   CO2 25 22 21*  --  BUN 26* 27* 24*  --   CREATININE 0.93 1.13* 0.99  --   CALCIUM 10.0 9.3 8.6*  --   PROT  --   --   --  6.0*  GLUCOSE 123* 232* 157*  --     Imaging/Diagnostic Tests: Chest Xray and CT showed improved pleural effusion s/p  thoracentesis 10/4  Richarda Osmond, DO 08/18/2018, 11:35 PM PGY-1, [place] Intern pager: [phone], text pages welcome

## 2018-08-18 NOTE — Procedures (Signed)
Thoracentesis Procedure Note  Pre-operative Diagnosis: Left sided pleural effusion  Post-operative Diagnosis: same  Indications: Pleural effusion  Procedure Details  Consent: Informed consent was obtained. Risks of the procedure were discussed including: infection, bleeding, pain, pneumothorax.  Under sterile conditions the patient was positioned. Betadine solution and sterile drapes were utilized.  1% plain lidocaine was used to anesthetize the 6 rib space. Fluid was obtained without any difficulties and minimal blood loss.  A dressing was applied to the wound and wound care instructions were provided.   Findings 1100 ml of bloody pleural fluid was obtained. A sample was sent to Pathology for cytogenetics, flow, and cell counts, as well as for infection analysis.  Complications:  None; patient tolerated the procedure well.          Condition: stable  Plan A follow up chest x-ray was ordered. Bed Rest for 0 hours. Tylenol 650 mg. for pain.  Attending Attestation: I was present and scrubbed for the entire procedure.  Rush Farmer, M.D. [facility] Pulmonary/Critical Care Medicine. Pager: [phone]. After hours pager: [phone].

## 2018-08-18 NOTE — Telephone Encounter (Signed)
Called and spoke to patient, patient wanted to let us know she was admitted into the hospital last night. Will route to SN as FYI.

## 2018-08-18 NOTE — Care Management Note (Addendum)
Case Management Note  Patient Details  Name: Jocelyn [name] MRN: [id number] Date of Birth: [birth year]  Subjective/Objective:    From home alone, presents with pl effusion (s/p thoracentesis 10/4 ), sarcoidosis, cad, htn, dm, osa, on iv abx.                 Action/Plan: NCM will follow for transition of care needs.  Expected Discharge Date:                  Expected Discharge Plan:     In-House Referral:     Discharge planning Services  CM Consult  Post Acute Care Choice:    Choice offered to:     DME Arranged:    DME Agency:     HH Arranged:    HH Agency:     Status of Service:  In process, will continue to follow  If discussed at Long Length of Stay Meetings, dates discussed:    Additional Comments:  Zenon Mayo, RN 08/18/2018, 9:24 AM

## 2018-08-19 DIAGNOSIS — I2510 Atherosclerotic heart disease of native coronary artery without angina pectoris: Secondary | ICD-10-CM

## 2018-08-19 LAB — CBC WITH DIFFERENTIAL/PLATELET
Abs Immature Granulocytes: 0.2 K/uL — ABNORMAL HIGH (ref 0.0–0.1)
Abs Immature Granulocytes: 0.1 K/uL (ref 0.0–0.1)
Basophils Absolute: 0 K/uL (ref 0.0–0.1)
Basophils Absolute: 0 K/uL (ref 0.0–0.1)
Basophils Relative: 0 %
Basophils Relative: 0 %
Eosinophils Absolute: 0.1 K/uL (ref 0.0–0.7)
Eosinophils Absolute: 0.1 K/uL (ref 0.0–0.7)
Eosinophils Relative: 1 %
Eosinophils Relative: 1 %
HCT: 31.1 % — ABNORMAL LOW (ref 36.0–46.0)
HCT: 31.4 % — ABNORMAL LOW (ref 36.0–46.0)
Hemoglobin: 10 g/dL — ABNORMAL LOW (ref 12.0–15.0)
Hemoglobin: 10 g/dL — ABNORMAL LOW (ref 12.0–15.0)
Immature Granulocytes: 1 %
Immature Granulocytes: 1 %
Lymphocytes Relative: 29 %
Lymphocytes Relative: 25 %
Lymphs Abs: 3.3 K/uL (ref 0.7–4.0)
Lymphs Abs: 2.8 K/uL (ref 0.7–4.0)
MCH: 30.5 pg (ref 26.0–34.0)
MCH: 30.4 pg (ref 26.0–34.0)
MCHC: 32.2 g/dL (ref 30.0–36.0)
MCHC: 31.8 g/dL (ref 30.0–36.0)
MCV: 94.8 fL (ref 78.0–100.0)
MCV: 95.4 fL (ref 78.0–100.0)
Monocytes Absolute: 1.4 K/uL — ABNORMAL HIGH (ref 0.1–1.0)
Monocytes Absolute: 1.2 K/uL — ABNORMAL HIGH (ref 0.1–1.0)
Monocytes Relative: 12 %
Monocytes Relative: 10 %
Neutro Abs: 6.6 K/uL (ref 1.7–7.7)
Neutro Abs: 7 K/uL (ref 1.7–7.7)
Neutrophils Relative %: 57 %
Neutrophils Relative %: 63 %
Platelets: 288 K/uL (ref 150–400)
Platelets: 296 K/uL (ref 150–400)
RBC: 3.28 MIL/uL — ABNORMAL LOW (ref 3.87–5.11)
RBC: 3.29 MIL/uL — ABNORMAL LOW (ref 3.87–5.11)
RDW: 13.5 % (ref 11.5–15.5)
RDW: 13.4 % (ref 11.5–15.5)
WBC: 11.6 K/uL — ABNORMAL HIGH (ref 4.0–10.5)
WBC: 11.1 K/uL — ABNORMAL HIGH (ref 4.0–10.5)

## 2018-08-19 LAB — PROTEIN, PLEURAL OR PERITONEAL FLUID: Total protein, fluid: 4.7 g/dL

## 2018-08-19 LAB — GLUCOSE, CAPILLARY
GLUCOSE-CAPILLARY: 124 mg/dL — AB (ref 70–99)
Glucose-Capillary: 115 mg/dL — ABNORMAL HIGH (ref 70–99)
Glucose-Capillary: 134 mg/dL — ABNORMAL HIGH (ref 70–99)
Glucose-Capillary: 116 mg/dL — ABNORMAL HIGH (ref 70–99)

## 2018-08-19 LAB — HEMOGLOBIN A1C
Hgb A1c MFr Bld: 6.5 % — ABNORMAL HIGH (ref 4.8–5.6)
MEAN PLASMA GLUCOSE: 140 mg/dL

## 2018-08-19 LAB — LACTATE DEHYDROGENASE, PLEURAL OR PERITONEAL FLUID: LD, Fluid: 271 U/L — ABNORMAL HIGH (ref 3–23)

## 2018-08-19 MED ORDER — DICLOFENAC SODIUM 1 % TD GEL
2.0000 g | Freq: Four times a day (QID) | TRANSDERMAL | Status: DC
  Administered 2018-08-19 – 2018-08-21 (×7): 2 g via TOPICAL
  Filled 2018-08-19: qty 100

## 2018-08-19 MED ORDER — DIPHENHYDRAMINE HCL 25 MG PO CAPS: 25.0000 mg | ORAL_CAPSULE | Freq: Four times a day (QID) | ORAL | Status: DC | PRN

## 2018-08-19 MED ORDER — TRAMADOL HCL 50 MG PO TABS
50.0000 mg | ORAL_TABLET | Freq: Four times a day (QID) | ORAL | Status: DC | PRN
  Administered 2018-08-19 – 2018-08-20 (×5): 50 mg via ORAL
  Filled 2018-08-19 (×5): qty 1

## 2018-08-19 NOTE — Plan of Care (Signed)
  Problem: Pain Managment: Goal: General experience of comfort will improve Outcome: Not Progressing   Problem: Clinical Measurements: Goal: Cardiovascular complication will be avoided Outcome: Progressing   Problem: Education: Goal: Knowledge of General Education information will improve Description Including pain rating scale, medication(s)/side effects and non-pharmacologic comfort measures Outcome: Progressing

## 2018-08-20 DIAGNOSIS — R9389 Abnormal findings on diagnostic imaging of other specified body structures: Secondary | ICD-10-CM

## 2018-08-20 DIAGNOSIS — J189 Pneumonia, unspecified organism: Secondary | ICD-10-CM

## 2018-08-20 DIAGNOSIS — Z9889 Other specified postprocedural states: Secondary | ICD-10-CM

## 2018-08-20 LAB — BASIC METABOLIC PANEL
ANION GAP: 10 (ref 5–15)
BUN: 17 mg/dL (ref 8–23)
CALCIUM: 8.7 mg/dL — AB (ref 8.9–10.3)
CO2: 23 mmol/L (ref 22–32)
Chloride: 105 mmol/L (ref 98–111)
Creatinine, Ser: 0.84 mg/dL (ref 0.44–1.00)
GFR calc non Af Amer: >60 mL/min (ref >60)
Glucose, Bld: 111 mg/dL — ABNORMAL HIGH (ref 70–99)
Potassium: 3.5 mmol/L (ref 3.5–5.1)
Sodium: 138 mmol/L (ref 135–145)

## 2018-08-20 LAB — CBC
HEMATOCRIT: 31.6 % — AB (ref 36.0–46.0)
Hemoglobin: 9.9 g/dL — ABNORMAL LOW (ref 12.0–15.0)
MCH: 30 pg (ref 26.0–34.0)
MCHC: 31.3 g/dL (ref 30.0–36.0)
MCV: 95.8 fL (ref 78.0–100.0)
PLATELETS: 258 10*3/uL (ref 150–400)
RBC: 3.3 MIL/uL — ABNORMAL LOW (ref 3.87–5.11)
RDW: 13.6 % (ref 11.5–15.5)
WBC: 10.1 10*3/uL (ref 4.0–10.5)

## 2018-08-20 LAB — GLUCOSE, CAPILLARY
GLUCOSE-CAPILLARY: 107 mg/dL — AB (ref 70–99)
GLUCOSE-CAPILLARY: 169 mg/dL — AB (ref 70–99)
Glucose-Capillary: 119 mg/dL — ABNORMAL HIGH (ref 70–99)
Glucose-Capillary: 140 mg/dL — ABNORMAL HIGH (ref 70–99)

## 2018-08-20 LAB — PH, BODY FLUID: PH, BODY FLUID: 7.5

## 2018-08-20 MED ORDER — AZITHROMYCIN 250 MG PO TABS: 250.0000 mg | ORAL_TABLET | Freq: Every day | ORAL | Status: DC

## 2018-08-20 MED ORDER — HYDROCODONE-ACETAMINOPHEN 5-325 MG PO TABS
1.0000 | ORAL_TABLET | Freq: Four times a day (QID) | ORAL | Status: DC | PRN
  Administered 2018-08-20 – 2018-08-21 (×2): 1 via ORAL
  Filled 2018-08-20 (×2): qty 1

## 2018-08-20 NOTE — Progress Notes (Addendum)
NAME:  Jocelyn [name], MRN:  [id number], DOB:  1956/07/16, LOS: 2 ADMISSION DATE:  08/17/2018, CONSULTATION DATE:  08/18/2018 REFERRING MD:  Docia Barrier - Hensel, CHIEF COMPLAINT:  Pleural effusion   Brief History   62 year old female non-smoker presenting with 2 days of chest pain.  Chest CT was done that I reviewed myself with pleural effusion noted.  No fever but cough productive of yellow/green sputum.  No weight loss and no hemoptysis.  Kindergarten teacher retired.  No sick contact.  Past Medical History  DM HTN CAD s/p stent placement  Significant Hospital Events   10/3 admission to the hospital  Consults: date of consult/date signed off & final recs:  N/A  Procedures (surgical and bedside):  Thora 10/4>>>hemothorax  Significant Diagnostic Tests:  Thora 10/4>>>Hemothorax  Micro Data:  Pleural fluid 10/4>>>Exudative, hemothorax  Antimicrobials:  Zithromax 10/3>>> Rocephin 10/3>>>  Subjective:  No events overnight, feels better this AM, no new complaints   Objective   Blood pressure 105/70, pulse 90, temperature 98.4 F (36.9 C), temperature source Oral, resp. rate 16, height 5\' 2"  (1.575 m), weight 78.2 kg, SpO2 93 %.       No intake or output data in the 24 hours ending 08/20/18 1405 Filed Weights   08/17/18 1809 08/17/18 2156  Weight: 77.8 kg 78.2 kg    Examination: General: Well appearing, NAD HENT: [place]/AT, PERRL, EOM-I and MMM Lungs: Decrease BS on the left, clear on the right Cardiovascular: RRR, Nl S1/S2 and -M/R/G Abdomen: Soft, NT, ND and +BS Extremities: -edema and -tenderness Neuro: Moving all ext to command Skin: Intact  Resolved Hospital Problem list   N/A  I reviewed chest CT myself, pleural based mass vs loculation vs a pleural rind  Assessment & Plan:  62 year old female with CP and pleural effusion.  Discussed with resident.  Pleural effusion: hemothorax with pleural rind  - CXR if sympotomatic  Pleurisy:  - NSAIDS as discussed with  the patient and resident  Hemothorax:  - Would consult with cardiology to hold brilanta  - May continue ASA  Abnormal CT with pleural rind vs mass  - Repeat CT in 6-8 wks and f/u with pulmonary  Disposition / Summary of Today's Plan 08/20/18    Labs   CBC: Recent Labs  Lab 08/16/18 1350 08/17/18 1620 08/19/18 0245 08/19/18 1524 08/20/18 0259  WBC 11.0* 16.6* 11.6* 11.1* 10.1  NEUTROABS 8.4*  --  6.6 7.0  --   HGB 14.6 12.2 10.0* 10.0* 9.9*  HCT 43.6 38.1 31.1* 31.4* 31.6*  MCV 90.7 95.3 94.8 95.4 95.8  PLT 434.0* 400 288 296 921    Basic Metabolic Panel: Recent Labs  Lab 08/16/18 1350 08/17/18 1620 08/18/18 0440 08/20/18 0259  NA 141 141 140 138  K 3.9 4.3 4.0 3.5  CL 104 107 104 105  CO2 25 22 21* 23  GLUCOSE 123* 232* 157* 111*  BUN 26* 27* 24* 17  CREATININE 0.93 1.13* 0.99 0.84  CALCIUM 10.0 9.3 8.6* 8.7*   GFR: Estimated Creatinine Clearance: 66.3 mL/min (by C-G formula based on SCr of 0.84 mg/dL). Recent Labs  Lab 08/17/18 1620 08/19/18 0245 08/19/18 1524 08/20/18 0259  WBC 16.6* 11.6* 11.1* 10.1    Liver Function Tests: Recent Labs  Lab 08/18/18 1207  PROT 6.0*   No results for input(s): LIPASE, AMYLASE in the last 168 hours. No results for input(s): AMMONIA in the last 168 hours.  ABG    Component Value Date/Time  TCO2 22 06/15/2017 1251     Coagulation Profile: No results for input(s): INR, PROTIME in the last 168 hours.  Cardiac Enzymes: Recent Labs  Lab 08/17/18 2222 08/18/18 0055 08/18/18 0440  TROPONINI <0.03 <0.03 <0.03    HbA1C: Hgb A1c MFr Bld  Date/Time Value Ref Range Status  08/17/2018 04:20 PM 6.5 (H) 4.8 - 5.6 % Final    Comment:    (NOTE)         Prediabetes: 5.7 - 6.4         Diabetes: >6.4         Glycemic control for adults with diabetes: <7.0   09/08/2011 03:53 PM 6.7 (H) 4.6 - 6.5 % Final    Comment:    Glycemic Control Guidelines for People with Diabetes:Non Diabetic:  <6%Goal of Therapy:  <7%Additional Action Suggested:  >8%     CBG: Recent Labs  Lab 08/19/18 1208 08/19/18 1739 08/19/18 2142 08/20/18 0748 08/20/18 1253  GLUCAP 124* 134* 116* 107* 169*   PCCM will sign off, please call back if needed.  Rush Farmer, M.D. [facility] Pulmonary/Critical Care Medicine. Pager: [phone]. After hours pager: [phone].

## 2018-08-20 NOTE — Progress Notes (Signed)
Family Medicine Teaching Service Daily Progress Note Intern Pager: [phone]  Patient name: Jocelyn [name] Medical record number: [id number] Date of birth: [birth year] Age: 62 y.o. Gender: female  Primary Care Provider: Noralee Space, MD Consultants: Pulmonology Code Status: Full code  Pt Overview and Major Events to Date:  08/17/2018 Admitted 08/18/2018 Thoracentesis  Hospital Day: 4   Assessment and Plan: Jocelyn [name] a 62 y.o.femalepresenting with left loculated pleural effusion and LLL and lingula consolidation, with atelectasis. PMH is significant forsarcoidosis, CAD s/p PCI w/stent placement, DM, hyperlipidemia, asthma, GERD, OSA.  Pleural Effusion: hemothorax Overnight patient remained hemodynamically stable and afebrile. She still complains of left sided shoulder and back pain that is somewhat improved with Tramadol, tylenol, diclofenac and a heating pad. Has not requested dilaudid due to side effects. WBC improved 16.6>11.6>10.1. Potassium dropped 4.0 > 3.5. Will continue to monitor. Hemoglobin remained stable overnight, however significant drop since admission (14.6 > 12.2 > 10 > 9.9 > 9.9). Will continue to monitor closely and hold Brilenta. Fluid results: WBC= 4,086, neutrophils 63, gram stain neg, culture neg x 2 days, pH pending, cholesterol pending, cytology pending. Pleural/serum LDH: 271:160 and pleural/serum Protein 4.7:6.0. Lights criteria positive. Differential includes hemothorax vs malignancy. Due to negative culture to date, will stop antibiotics. For the pain, will try Vicodin.  - Follow Pulmonology recs:   -Follow up pleural fluid cytology   -Cytology (-): recommend outpatient f/u with pulm for repeat CT w/o contrast in 6-8 wks to re-evaluate mass ([place] to make appt: [phone]).   -Cytology (+): consult heme/onc - Follow-up fluid analysis from thoracentesis - Discontinue IV Ceftriaxone and azithromycin - Continue to monitor K+, repleat as necessary -  Continue to monitor hemoglobin  - Discontinue Dilaudid - Start Vicodin 5-325mg  q6hrs PRN for pain  - Home tramadol 50mg  q6 PRN for pain - Tylenol PRN - Benadryl PRN - Zofran prn - K-pad and diclofenac gel for pain - AM CBC and CMP  CAD s/p Stent: troponins neg x 3 - continue home ASA and diltiazem  - Holding home brilenta due to bloody pleural effusion  - Will contact cardiologist Dr. Irish [name] in AM to discuss holding Brilenta long term  - monitor vitals  HTN: Stable BP overnight -Continue home metoprolol, losartan 100mg , nitro SL PRN -Hydralazine PRN -Monitor vitals  DM: well controlled CBG's 107-178. A1c 6.5 - Hold home Metformin/empagaflozin, will restart at discharge - SSI   AKI: resolved (Cr: 0.84 today) - continue to monitor  Sarcoidosis: stable, no active disease x 30 years - Continue Breo-ellipta QD, home Flonase PRN, and albuterol PRN  GERD:  - Continue pantoprazole PRN  Anxiety:  -Continue home xanax 0.5mg  PRN  Fluids: Saline lock Electrolytes: Repleat as necessary Nutrition: Heart healthy, carb modified diet GI: miralax QD DVT ppx: SCDs  Future labs: 10/7 CBC and CMP Disposition: Med surg. Home pending improvement   Medications: Scheduled Meds: . aspirin  81 mg Oral Daily  . diclofenac sodium  2 g Topical QID  . diltiazem  240 mg Oral Daily  . fluticasone furoate-vilanterol  1 puff Inhalation Daily  . insulin aspart  0-9 Units Subcutaneous TID WC  . losartan  100 mg Oral Daily  . metoprolol tartrate  50 mg Oral BID  . pantoprazole  40 mg Oral Daily  . polyethylene glycol  17 g Oral Daily   Continuous Infusions:  PRN Meds: acetaminophen, albuterol, ALPRAZolam, diphenhydrAMINE, fluticasone, hydrALAZINE, HYDROcodone-acetaminophen, nitroGLYCERIN, ondansetron, traMADol  ================================================= ================================================= Subjective:  Patient reports doing  doing well overnight. Pt has had good  appetite and normal voids and bowel movements. Patient notes her upper back/shoulder pain is still present, only somewhat relieved with tylenol, tramadol, heating pad and diclofenac. Denies any nausea, vomiting, constipation, diarrhea, chest pain, SOB or cough. Ambulating well.  Objective: Vital Signs Temp:  [98.1 F (36.7 C)-98.8 F (37.1 C)] 98.4 F (36.9 C) (10/06 1219) Pulse Rate:  [81-97] 90 (10/06 1219) Resp:  [16-18] 16 (10/06 1219) BP: (95-118)/(70-75) 105/70 (10/06 1219) SpO2:  [93 %-100 %] 93 % (10/06 1219)  Intake/Output No intake/output data recorded.  Physical Exam:  Gen: NAD, alert, non-toxic, well-appearing, sitting comfortably in bed Skin: Warm and dry. No obvious rashes, lesions, or trauma. HEENT: NCAT No conjunctival pallor or injection. No scleral icterus or injection.  MMM.  CV: RRR. <2s capillary refill bilaterally.  RP & DPs 2+ bilaterally. No bilateral LE edema Resp: CTAB.  No wheezing, rales, abnormal lung sounds.  No increased WOB Abd: NTND on palpation to all 4 quadrants.  Positive bowel sounds. Psych: Cooperative with exam. Pleasant. Makes eye contact. Speech normal. Extremities: Moves all extremities spontaneously  MSK: TTP along upper left back and along top of shoulder and to upper left chest. Nontender on right. No erythema or warmth along skin.    Laboratory: Recent Labs  Lab 08/19/18 0245 08/19/18 1524 08/20/18 0259  WBC 11.6* 11.1* 10.1  HGB 10.0* 10.0* 9.9*  HCT 31.1* 31.4* 31.6*  PLT 288 296 258   Recent Labs  Lab 08/17/18 1620 08/18/18 0440 08/18/18 1207 08/20/18 0259  NA 141 140  --  138  K 4.3 4.0  --  3.5  CL 107 104  --  105  CO2 22 21*  --  23  BUN 27* 24*  --  17  CREATININE 1.13* 0.99  --  0.84  CALCIUM 9.3 8.6*  --  8.7*  PROT  --   --  6.0*  --   GLUCOSE 232* 157*  --  111*   Imaging/Diagnostic Tests: Ct Chest Wo Contrast  Result Date: 08/18/2018 CLINICAL DATA:  Chest wall pain.  Re-evaluate lung mass. EXAM: CT  CHEST WITHOUT CONTRAST TECHNIQUE: Multidetector CT imaging of the chest was performed following the standard protocol without IV contrast. COMPARISON:  08/17/2018 FINDINGS: Cardiovascular: Normal heart size. No pericardial effusion. Aortic and coronary atherosclerosis with coronary stent in the RCA. Retroesophageal course of the right subclavian artery. Mediastinum/Nodes: Negative for adenopathy. Lungs/Pleura: Unchanged somewhat dense plaque-like mass in the medial left chest which is pleural based. There are no internal air bronchograms. The mass is lobulated measures up to 3 cm in thickness. The extent is from the apex to the left heart border. The left lower lobe remains collapsed. Pleural fluid has been evacuated. No primary parenchymal mass is seen. Upper Abdomen: Vicarious excretion of contrast into the biliary tree. There is still contrast seen within the renal collecting system. Musculoskeletal: No acute or aggressive finding. Thoracic disc narrowing and endplate spurring. IMPRESSION: 1. Unchanged pleural based mass in the medial left chest. 2. No pulmonary parenchymal mass is seen, although the left lower lobe remains collapsed. 3. No adenopathy or thymic mass. 4. Indeterminate left adrenal nodule measuring 15 mm, significance to be determined based on thoracentesis histology. Electronically Signed   By: Monte Fantasia M.D.   On: 08/18/2018 15:56    Danna Hefty, DO 08/20/2018, 2:44 PM PGY-1, [place] Intern pager: [phone], text pages welcome

## 2018-08-21 ENCOUNTER — Telehealth: Payer: Self-pay | Admitting: Interventional Cardiology

## 2018-08-21 ENCOUNTER — Encounter (HOSPITAL_COMMUNITY): Payer: Self-pay

## 2018-08-21 LAB — COMPREHENSIVE METABOLIC PANEL
ALBUMIN: 2.8 g/dL — AB (ref 3.5–5.0)
ALT: 14 U/L (ref 0–44)
ANION GAP: 8 (ref 5–15)
AST: 16 U/L (ref 15–41)
Alkaline Phosphatase: 62 U/L (ref 38–126)
BUN: 13 mg/dL (ref 8–23)
CALCIUM: 8.8 mg/dL — AB (ref 8.9–10.3)
CHLORIDE: 103 mmol/L (ref 98–111)
CO2: 25 mmol/L (ref 22–32)
Creatinine, Ser: 0.82 mg/dL (ref 0.44–1.00)
GFR calc Af Amer: >60 mL/min (ref >60)
GFR calc non Af Amer: >60 mL/min (ref >60)
Glucose, Bld: 128 mg/dL — ABNORMAL HIGH (ref 70–99)
Potassium: 3.8 mmol/L (ref 3.5–5.1)
SODIUM: 136 mmol/L (ref 135–145)
TOTAL PROTEIN: 5.7 g/dL — AB (ref 6.5–8.1)
Total Bilirubin: 0.5 mg/dL (ref 0.3–1.2)

## 2018-08-21 LAB — CBC
HCT: 30.9 % — ABNORMAL LOW (ref 36.0–46.0)
Hemoglobin: 9.9 g/dL — ABNORMAL LOW (ref 12.0–15.0)
MCH: 30.6 pg (ref 26.0–34.0)
MCHC: 32 g/dL (ref 30.0–36.0)
MCV: 95.4 fL (ref 78.0–100.0)
Platelets: 303 10*3/uL (ref 150–400)
RBC: 3.24 MIL/uL — ABNORMAL LOW (ref 3.87–5.11)
RDW: 13.5 % (ref 11.5–15.5)
WBC: 12 10*3/uL — ABNORMAL HIGH (ref 4.0–10.5)

## 2018-08-21 LAB — GLUCOSE, CAPILLARY
GLUCOSE-CAPILLARY: 140 mg/dL — AB (ref 70–99)
Glucose-Capillary: 110 mg/dL — ABNORMAL HIGH (ref 70–99)

## 2018-08-21 MED ORDER — HYDROCODONE-ACETAMINOPHEN 5-325 MG PO TABS: 1.0000 | ORAL_TABLET | Freq: Three times a day (TID) | ORAL | 0 refills | Status: AC | PRN

## 2018-08-21 MED ORDER — DICLOFENAC SODIUM 1 % TD GEL: 2.0000 g | Freq: Four times a day (QID) | TRANSDERMAL | 0 refills | Status: DC

## 2018-08-21 NOTE — Care Management Note (Signed)
Case Management Note  Patient Details  Name: Jocelyn [name] MRN: [id number] Date of Birth: 08-Jul-1956  Subjective/Objective:   Patient for dc today, per MD, no needs.                  Action/Plan: DC home when ready.  Expected Discharge Date:  08/21/18               Expected Discharge Plan:  Home/Self Care  In-House Referral:     Discharge planning Services  CM Consult  Post Acute Care Choice:    Choice offered to:     DME Arranged:    DME Agency:     HH Arranged:    HH Agency:     Status of Service:  Completed, signed off  If discussed at [employer] of Stay Meetings, dates discussed:    Additional Comments:  Zenon Mayo, RN 08/21/2018, 3:23 PM

## 2018-08-21 NOTE — Telephone Encounter (Signed)
Returned call to patient. Patient states that she has been in the hospital for pneumonia and a pleural effusion. Patient states that they drained 1100 mL of fluid. Patient states that she has not had any Brilinta since Thursday of last week. Patient states that she has continued to receive ASA. Patient wanting to make Dr. Irish Lack aware. Made patient aware that Dr. Tarry Kos has reached out and requested a phone call back from Dr. Irish Lack after 1:30 PM today. Made patient aware that I would let Dr. Irish Lack know and contact her back after he speaks to Dr. Tarry Kos. Patient verbalized understanding and thanked me for calling her back.

## 2018-08-21 NOTE — Telephone Encounter (Signed)
° °  Dr Tarry Kos [facility] requesting call from Dr Irish Lack, after 1:30 today. Declined to wait on hold Please call [phone]

## 2018-08-21 NOTE — Progress Notes (Signed)
Dc instructions given with rx. Went over [employer] instructions and give pt note to return to work. Pt stated she understood instructions. Dc home in stable condition. With friend

## 2018-08-21 NOTE — Telephone Encounter (Signed)
Called and made patient aware that Dr. Irish Lack spoke to Dr. Tarry Kos regarding her [place].

## 2018-08-21 NOTE — Discharge Instructions (Signed)
Dear Jocelyn [name],   Thank you for letting us participate in your care! In this section, you will find a brief hospital admission summary of why you were admitted to the hospital, what happened, and any further follow up we think would benefit you and your health:   You were admitted because you were experiencing shortness of breath and chest pain. Your initial work up was significant for pleural effusion of your left lung that was found to consist of blood.   The fluid was drained and was sent to be further evaluated. It did not show any signs of infection. We are currently waiting on the cytology results.   Pulmonology would like you to follow-up with them in 6 weeks for a repeat CT scan.   I have prescribed you Vicodin and Voltaren gel for your left shoulder pain. Please see your PCP in 1 week for follow-up and to have your pain reevaluated.    I spoke to your cardiologist, Dr. Irish Lack, and we decided to stop your Brilenta at this time. Please continue your Aspirin. Be sure to follow-up with Dr. Irish Lack at your scheduled appointment.  Your breathing improved and you were discharged from the hospital for meeting this goal. We will contact you with the results of your cytology.   POST-HOSPITAL/FOLLOW-UP CARE INSTRUCTIONS Home instructions:  1. Please take your [place] and Voltaren gel for your pain. 2. Please see your PCP in 1 week for follow-up and reevaluation of your pain 3. Your repeat CT scan is scheduled for 08/25/2018 at 3:45 pm at [facility] 4. You have an appointment with Dr. [name] on 09/06/2018 at 12 pm  5. Please stop taking your Brilenta. Continue taking your Aspirin and Diltiazem  Thank you for choosing [facility]! Take care and be well!  [facility]  [place], [place] [address] [phone]

## 2018-08-21 NOTE — Progress Notes (Signed)
Paged family med intern to ask about pt going home today. Pt requested I ask MD.

## 2018-08-21 NOTE — Telephone Encounter (Signed)
New Message :      Pt c/o medication issue:  1. Name of Medication: BRILINTA 90 MG TABS tablet  2. How are you currently taking this medication (dosage and times per day)? TAKE 1 TABLET BY MOUTH TWICE A DAY  3. Are you having a reaction (difficulty breathing--STAT)? No   4. What is your medication issue? Patient has questions about medication

## 2018-08-21 NOTE — Progress Notes (Signed)
FPTS Interim Progress Note  Spoke to patient's cardiologist Dr. Irish Lack concerning holding patients Brilenta. Informed Dr. Irish Lack about patient's hospital course, her resulting hemothorax and planned further pulmonary workup. After discussion, it was decided to hold her Brilenta at this time. Continue her aspirin. He will follow up with her at her scheduled appointment and may consider adding Plavix.  Mina Marble [place]on, DO 08/21/2018, 3:03 PM PGY-1, [place] Medicine Service pager [phone]

## 2018-08-22 LAB — BODY FLUID CULTURE
CULTURE: NO GROWTH
SPECIAL REQUESTS: NORMAL

## 2018-08-22 NOTE — Discharge Summary (Signed)
[facility] Discharge Summary  Patient name: Jocelyn [name] Medical record number: [id number] Date of birth: [birth year] Age: 62 y.o. Gender: female Date of Admission: 08/17/2018  Date of Discharge: 08/21/2018 Admitting Physician: Zenia Resides, MD  Primary Care Provider: Noralee Space, MD Consultants: Pulmonology  Indication for Hospitalization: Pleural Effusion  Discharge Diagnoses/Problem List:  Pleural Effusion Coronary Artery Disease   Disposition: Home  Discharge Condition: Improved  Discharge Exam:  Physical Exam:  Gen: NAD, alert, non-toxic, well-appearing, sitting comfortably in bed Skin: Warm and dry. No obvious rashes, lesions, or trauma. HEENT: NCAT No conjunctival pallor or injection. No scleral icterus or injection.  MMM.  CV: RRR. <2s capillary refill bilaterally.  RP & DPs 2+ bilaterally. No bilateral LE edema Resp: Improved lung sounds, some decreased lungs sounds in left lower lobe.  No wheezing, rales, abnormal lung sounds.  No increased WOB Abd: NTND on palpation to all 4 quadrants.  Positive bowel sounds. Psych: Cooperative with exam. Pleasant. Makes eye contact. Speech normal. Extremities: Moves all extremities spontaneously  MSK: TTP along left shoulder and upper back   Brief Hospital Course:  OVETA IDRIS is a 62 y.o. female with past medical history significant for sarcoidosis, CAD s/p PCI with stent placement, DM, HLD, asthma, GERD, and OSA, who presented with shortness of breath and left sided upper chest pain and found to have a left loculated pleural effusion and left lower lobe and lingula consolidation. Initial work up was significant for elevated WBC of 16.6, EKG significant for sinus tachycardia, negative troponin x 3, and a CT and chest x-ray positive for a pleural effusion of the left lower lobe. CT chest also found a lobulated mass like thickening along the left mediastinal contour extending to the left lung apex  that was concerning for large pleural based mass. Patient was intially admitted to telemetry and started on Ceftriaxone and Azithromycin for empiric therapy for CAP 2/2 to possible parapneumonia infection.   Consulting providers included pulmonology. Thoracentesis was performed on 10/4, 1100 ml of bloody pleural fluid was obtained and sent for analysis. Repeat CT revealed improved pleural effusion however pleural based mass within the medial left chest remained unchanged. Through the remainder of her admission, patient's shortness of breath improved. Her antibiotics were discontinued on 10/6 as the fluid culture remained negative for >48 hours. The remainder of the fluid analysis results were significant for exudative pleural effusion although cause was not identified. Cytology was negative for malignancy.  Other hospital stay findings included acute kidney injury that resolved by discharge.  Lastly, during her hospital stay patient's Luna Kitchens was discontinued due to the presence of the hemothorax. After discussion with patient's cardiologist, Dr. Irish Lack, it was decided against continuing the Brilenta at this time. Patient to follow-up with Dr. Irish Lack.  During the admission, patient's SOB continued to resolve and Sharmel was improved and stable by the day of discharge with close follow-up. She had residual left shoulder pain that was treated with short course of Vicodin and Voltaren gel.  Recommendations were as follows: Repeat CT chest without contrast in 6 weeks.  Issues for Follow Up:  1. Repeat CT chest without contrast on October 11th  2. Small left sided 18m nodule was noted on CT chest from 2008. This 2008 finding was not compared to most recent mass found on CT during admission. Would recommend that next CT chest note the 2008 CT findings. 3. Follow up with pulmonology, Dr. NLenna Gilford on Oct. 23  4.  Patient to follow-up with PCP for further management of her left shoulder pain.     Significant Procedures: CT chest, thoracentesis  Significant Labs and Imaging:  Recent Labs  Lab 08/19/18 1524 08/20/18 0259 08/21/18 0251  WBC 11.1* 10.1 12.0*  HGB 10.0* 9.9* 9.9*  HCT 31.4* 31.6* 30.9*  PLT 296 258 303   Recent Labs  Lab 08/17/18 1620 08/18/18 0440 08/20/18 0259 08/21/18 0251  NA 141 140 138 136  K 4.3 4.0 3.5 3.8  CL 107 104 105 103  CO2 22 21* 23 25  GLUCOSE 232* 157* 111* 128*  BUN 27* 24* 17 13  CREATININE 1.13* 0.99 0.84 0.82  CALCIUM 9.3 8.6* 8.7* 8.8*  ALKPHOS  --   --   --  62  AST  --   --   --  16  ALT  --   --   --  14  ALBUMIN  --   --   --  2.8*    Results/Tests Pending at Time of Discharge: None  Discharge Medications:  Allergies as of 08/21/2018      Reactions   Actos [pioglitazone] Other (See Comments)   REACTION: pt states INTOL w/ edema   Codeine Other (See Comments)   REACTION: vomiting   Januvia [sitagliptin] Nausea Only   Lactose Intolerance (gi) Diarrhea   Morphine Nausea Only, Nausea And Vomiting   REACTION: vomiting REACTION: vomiting      Medication List    STOP taking these medications   azithromycin 250 MG tablet Commonly known as:  ZITHROMAX   BRILINTA 90 MG Tabs tablet Generic drug:  ticagrelor   doxycycline 100 MG tablet Commonly known as:  VIBRA-TABS   methylPREDNISolone 4 MG Tbpk tablet Commonly known as:  MEDROL DOSEPAK   predniSONE 10 MG tablet Commonly known as:  DELTASONE     TAKE these medications   ACTHAR 80 UNIT/ML injectable gel Generic drug:  corticotropin Inject 80 Units into the skin 3 (three) times a week.   albuterol 108 (90 Base) MCG/ACT inhaler Commonly known as:  PROVENTIL HFA;VENTOLIN HFA Inhale 1-2 puffs into the lungs every 6 (six) hours as needed for wheezing or shortness of breath.   ALPRAZolam 0.5 MG tablet Commonly known as:  XANAX 1/2 morning, 1/2 in afternoon, and 1 at bedtime   aspirin 81 MG chewable tablet Chew 1 tablet (81 mg total) by mouth daily.    CARTIA XT 240 MG 24 hr capsule Generic drug:  diltiazem TAKE 1 CAPSULE(240 MG) BY MOUTH DAILY   Cyanocobalamin 1000 MCG/ML Kit Inject 1,000 mg as directed every 30 (thirty) days.   diclofenac sodium 1 % Gel Commonly known as:  VOLTAREN Apply 2 g topically 4 (four) times daily.   fluticasone 50 MCG/ACT nasal spray Commonly known as:  FLONASE Place 2 sprays into both nostrils at bedtime. What changed:    when to take this  reasons to take this   Fluticasone-Salmeterol 232-14 MCG/ACT Aepb Inhale 2 puffs into the lungs 2 (two) times daily.   furosemide 20 MG tablet Commonly known as:  LASIX Take 20 mg by mouth daily as needed for fluid or edema.   HYDROcodone-acetaminophen 5-325 MG tablet Commonly known as:  NORCO/VICODIN Take 1 tablet by mouth every 8 (eight) hours as needed for up to 5 days for moderate pain or severe pain.   losartan 100 MG tablet Commonly known as:  COZAAR TAKE 1 TABLET BY MOUTH ONCE DAILY   metoprolol tartrate 50 MG tablet Commonly  known as:  LOPRESSOR TAKE 1 TABLET BY MOUTH TWICE A DAY   nitroGLYCERIN 0.4 MG SL tablet Commonly known as:  NITROSTAT Place 1 tablet (0.4 mg total) under the tongue every 5 (five) minutes as needed for chest pain.   ondansetron 4 MG disintegrating tablet Commonly known as:  ZOFRAN-ODT Take 4 mg by mouth every 8 (eight) hours as needed for nausea or vomiting.   pantoprazole 40 MG tablet Commonly known as:  PROTONIX take 1 tablet by mouth once daily   REPATHA SURECLICK 197 MG/ML Soaj Generic drug:  Evolocumab INJECT 1 PEN INTO THE SKIN EVERY 14 (FOURTEEN) DAYS. REFRIGERATE.   SYNJARDY XR 25-1000 MG Tb24 Generic drug:  Empagliflozin-metFORMIN HCl ER Take 1 tablet by mouth daily.   traMADol 50 MG tablet Commonly known as:  ULTRAM Take 1 tablet (50 mg total) by mouth 3 (three) times daily as needed.   valACYclovir 500 MG tablet Commonly known as:  VALTREX Take 500 mg by mouth daily.       Discharge  Instructions: Please refer to Patient Instructions section of EMR for full details.  Patient was counseled important signs and symptoms that should prompt return to medical care, changes in medications, dietary instructions, activity restrictions, and follow up appointments.   Follow-Up Appointments:   Ct Chest W Contrast  Friday Aug 25, 2018 4:00 PM  Liquids only 4 hrs prior, arrive 15 mins prior to scheduled appt [place]  [place], [place] [address]  [phone]    OFFICE VISIT 30 with Teressa Lower, MD  Wednesday Sep 06, 2018 12:00 PM  Please arrive 15 minutes prior to your appointment. This will allow [name] to verify and update your medical record and ensure a full appointment for you within the time allotted. [facility]  [place] [address]  [phone]    OFFICE VISIT 20 with Larae Grooms, MD  Wednesday Sep 13, 2018 3:40 PM  Please arrive 15 minutes prior to your appointment. This will allow [name] to verify and update your medical record and ensure a full appointment for you within the time allotted. [facility]  [address], [address] [place] [address]  [phone]    Danna Hefty, DO 08/23/2018, 3:38 PM PGY-1, [place]

## 2018-08-23 ENCOUNTER — Encounter (HOSPITAL_COMMUNITY): Payer: Self-pay

## 2018-08-23 LAB — CHOLESTEROL, BODY FLUID: Cholesterol, Fluid: 193 mg/dL

## 2018-08-23 NOTE — Progress Notes (Signed)
FPTS Interim Progress Note  Spoke to patient to inform her of her cytology results. Informed her to keep her scheduled appointments and to see her PCP for further management of her pain. Patient understood and agreed. Return precautions discussed.  Mina Marble [place], DO 08/23/2018, 7:24 PM PGY-1, [place] [facility] pager [phone]

## 2018-08-24 ENCOUNTER — Telehealth: Payer: Self-pay | Admitting: Pulmonary Disease

## 2018-08-24 NOTE — Telephone Encounter (Signed)
Called spoke with Marzetta Board at [facility], she states patient was in the ER and had a CT with contrast on 08/17/2018 and then another CT without contrast 08/18/2018 to evaluate a lung mass.  Patient scheduled for CT with contrast tomorrow. Should we cancel? Or have patient keep CT?  Dr. [name] please advise

## 2018-08-24 NOTE — Telephone Encounter (Signed)
Per SN - pt does not need another CT done, you can cancel the one for tomorrow. Pt's appointment needs to be moved to next week.  Called CT and left a message for them to cancel this CT.  Spoke with pt. She is aware of the above information. Her OV has been moved up to 08/30/18. Nothing further was needed.

## 2018-08-24 NOTE — Telephone Encounter (Signed)
Pt is calling back [phone]

## 2018-08-25 ENCOUNTER — Encounter (HOSPITAL_COMMUNITY): Payer: Self-pay

## 2018-08-25 ENCOUNTER — Inpatient Hospital Stay: Admission: RE | Admit: 2018-08-25 | Payer: BC Managed Care – PPO | Source: Ambulatory Visit

## 2018-08-28 ENCOUNTER — Telehealth: Payer: Self-pay | Admitting: Pulmonary Disease

## 2018-08-28 ENCOUNTER — Encounter (HOSPITAL_COMMUNITY): Payer: Self-pay

## 2018-08-28 NOTE — Telephone Encounter (Signed)
Called spoke with patient. She states she has a dry cough, sob, chills, fever, and has for 2 days. Patient does not want to see NP, SN has no opening today. Patient is still wanting only to see SN, so appt made tomorrow 08/29/2018 at 0930.    Will route to SN as FYI

## 2018-08-29 ENCOUNTER — Ambulatory Visit: Payer: BC Managed Care – PPO | Admitting: Pulmonary Disease

## 2018-08-30 ENCOUNTER — Encounter (HOSPITAL_COMMUNITY): Payer: Self-pay

## 2018-08-30 ENCOUNTER — Telehealth: Payer: Self-pay | Admitting: Pulmonary Disease

## 2018-08-30 ENCOUNTER — Ambulatory Visit (INDEPENDENT_AMBULATORY_CARE_PROVIDER_SITE_OTHER)
Admission: RE | Admit: 2018-08-30 | Discharge: 2018-08-30 | Disposition: A | Discharge status: Home / Self Care | Payer: BC Managed Care – PPO | Source: Ambulatory Visit | Attending: Pulmonary Disease | Admitting: Pulmonary Disease

## 2018-08-30 ENCOUNTER — Ambulatory Visit (INDEPENDENT_AMBULATORY_CARE_PROVIDER_SITE_OTHER): Payer: BC Managed Care – PPO | Admitting: Pulmonary Disease

## 2018-08-30 ENCOUNTER — Encounter: Payer: Self-pay | Admitting: Pulmonary Disease

## 2018-08-30 VITALS — BP 132/70 | HR 105 | Temp 99.1°F | Ht 62.0 in | Wt 168.2 lb

## 2018-08-30 DIAGNOSIS — I2510 Atherosclerotic heart disease of native coronary artery without angina pectoris: Secondary | ICD-10-CM

## 2018-08-30 DIAGNOSIS — J90 Pleural effusion, not elsewhere classified: Secondary | ICD-10-CM

## 2018-08-30 DIAGNOSIS — E7800 Pure hypercholesterolemia, unspecified: Secondary | ICD-10-CM

## 2018-08-30 DIAGNOSIS — M3320 Polymyositis, organ involvement unspecified: Secondary | ICD-10-CM

## 2018-08-30 DIAGNOSIS — J449 Chronic obstructive pulmonary disease, unspecified: Secondary | ICD-10-CM

## 2018-08-30 DIAGNOSIS — J181 Lobar pneumonia, unspecified organism: Secondary | ICD-10-CM

## 2018-08-30 DIAGNOSIS — R9389 Abnormal findings on diagnostic imaging of other specified body structures: Secondary | ICD-10-CM | POA: Diagnosis not present

## 2018-08-30 DIAGNOSIS — I10 Essential (primary) hypertension: Secondary | ICD-10-CM

## 2018-08-30 DIAGNOSIS — F411 Generalized anxiety disorder: Secondary | ICD-10-CM

## 2018-08-30 DIAGNOSIS — I252 Old myocardial infarction: Secondary | ICD-10-CM

## 2018-08-30 DIAGNOSIS — Z862 Personal history of diseases of the blood and blood-forming organs and certain disorders involving the immune mechanism: Secondary | ICD-10-CM

## 2018-08-30 DIAGNOSIS — J189 Pneumonia, unspecified organism: Secondary | ICD-10-CM

## 2018-08-30 DIAGNOSIS — E119 Type 2 diabetes mellitus without complications: Secondary | ICD-10-CM

## 2018-08-30 DIAGNOSIS — Z955 Presence of coronary angioplasty implant and graft: Secondary | ICD-10-CM

## 2018-08-30 MED ORDER — PREDNISONE 20 MG PO TABS: ORAL_TABLET | ORAL | 0 refills | Status: DC

## 2018-08-30 MED ORDER — HYDROCODONE-GUAIFENESIN 2.5-200 MG/5ML PO SOLN: 5.0000 mL | ORAL | 0 refills | Status: DC | PRN

## 2018-08-30 MED ORDER — GUAIFENESIN-CODEINE 100-10 MG/5ML PO SYRP: 5.0000 mL | ORAL_SOLUTION | ORAL | 0 refills | Status: DC | PRN

## 2018-08-30 MED ORDER — AMOXICILLIN-POT CLAVULANATE 875-125 MG PO TABS: 1.0000 | ORAL_TABLET | Freq: Two times a day (BID) | ORAL | 0 refills | Status: DC

## 2018-08-30 MED ORDER — PREDNISONE 10 MG PO TABS: ORAL_TABLET | ORAL | 0 refills | Status: DC

## 2018-08-30 MED ORDER — HYDROCODONE-HOMATROPINE 5-1.5 MG/5ML PO SYRP: 5.0000 mL | ORAL_SOLUTION | ORAL | 0 refills | Status: DC | PRN

## 2018-08-30 NOTE — Telephone Encounter (Signed)
Called and spoke with Cassie letting her know to cancel the pred 10mg  tablet Rx that had been sent in and stated to her that I was sending a new Rx for pred 20 in to pharmacy for pt.  Cassie expressed understanding. Rx has been sent in. Nothing further needed.

## 2018-08-30 NOTE — Telephone Encounter (Signed)
Per SN:  Change pt's cough syrup from the hydrocodone-guaifenesin to hycodan.  I called and spoke with Cassie letting her know of the change. When I tried to see if it would send electronically, the Rx printed.  Called and spoke with pt letting her know the prednisone Rx was ready for pick up at the pharmacy but stated to her when we changed the cough syrup, it is one that she has to pick up the hard copy and take to her pharmacy.  Pt expressed understanding and stated either herself or someone will come by the office tomorrow, 10/17 to pick up the Rx.  The Rx has been placed up front for pt.nothing further needed.

## 2018-08-30 NOTE — Telephone Encounter (Signed)
Instructions from pt's OV today, 10/16 in regards to prednisone: Start the PREDNISONE 20mg  tabs - one tab twice daily x 3 days, then one tab each AM til return visit in 1week...  Looking at the med that was sent in, it was a pred 10mg  tablet but with the stated above instructions.  Dr. [name], please advise if you meant to say start prednisone 10mg  tabs with the instructions for pt to take 1 tab twice daily x3 days and then 1 tablet daily until pt returns back to the office in 1 week. Thanks!

## 2018-08-30 NOTE — Patient Instructions (Addendum)
Today we updated your med list in our EPIC system...    Continue your current medications the same...  Today we did a follow up CXR...    We will contact you w/ the results when available...   We decided to place you on an empiric antibiotic/ anti-inflamm treatment course>>    Start the AUGMENTIN 875 tabs -  One tab twice daily til gone...    Start the PREDNISONE 20mg  tabs - one tab twice daily x 3 days, then one tab each AM til return visit in 1week...  We also wrote for a cough syrup - use the HYCOTUSS cough syrup -  one tsp every 4H as needed for cough...  Rest at home, fluids and Tylenol, OK to place a heating pad on left hest wall...    Call for any questions...       Let's plan a follow up OV & CXR in 1week.Marland KitchenMarland Kitchen

## 2018-08-31 ENCOUNTER — Other Ambulatory Visit: Payer: Self-pay | Admitting: Pulmonary Disease

## 2018-08-31 DIAGNOSIS — R918 Other nonspecific abnormal finding of lung field: Secondary | ICD-10-CM

## 2018-08-31 DIAGNOSIS — R229 Localized swelling, mass and lump, unspecified: Principal | ICD-10-CM

## 2018-08-31 DIAGNOSIS — IMO0002 Reserved for concepts with insufficient information to code with codable children: Secondary | ICD-10-CM

## 2018-09-01 ENCOUNTER — Encounter (HOSPITAL_COMMUNITY): Payer: Self-pay

## 2018-09-01 ENCOUNTER — Encounter: Payer: Self-pay | Admitting: Pulmonary Disease

## 2018-09-01 NOTE — Progress Notes (Addendum)
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 Brillinta90Bid, Metop50Bid, CardizemCD240, Losar100, Lasix20prn;  BP= 126/74, no recurrent CP, no palpit, syncope, edema; followed by [name] & last seen 07/08/17- post hosp check & no change in meds, rec to continue aggressive secondary prevention...    VI & chr stasis changes> on low salt diet, Lasix20 prn, compression hose; she was seen in the [facility] wound clinic yrs ago by DrEaton Corporation DrSevier...    Chol>  Followed in the Li[facility]re[facility]x statin intol, prev in the ACCELERATE lipid lowering study in 2015, CPK elev & she was taken off the drug, she's also had myalgias from Zetia; currently on REPATHA shots Q14d;  LAB f/u FLP on Repatha 08/18/16 showed TChol 191, TG 214, HDL 41, LDL 107...    DM, Obesity>  Followed by [name], note 07/21/17 reviewed, [place] to [place]-1000 one daily;  A1c= 6.8    GI- GERD, IBS>  On Protonix40, Zofran4 prn; prev followed by [name]- she had EGD & Colon 7/15; presented c/o ext hem- Rx w/ sitz, baby wipes, benefiberAnusolHC...    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 We will call for updated notes from  him... She is also c/o LBP & wants TRAMADOL refilled...     Anxiety>  On Xanax 0,32m532m/2-1 tab Tid...    VitB12 & VitD defic>  On B12 1000m11mM Qmo per [name] &  B12 level done 8/14 = 154 w/ f/u B12 level >1500 on the shots;  she tells me that [name] treated her low VitD w/ 50K weekly, then switched to daily OTC supplement, we do not have notes from DrKeAkron   Borderline anemia & low Ferritin>  she was treated w/ Fe from [name] & improved... EXAM shows Afeb, VSS, O2sat=99% on RA; Wt=173#, 5'2"Tall, BMI=32; Heent-  neg, mallampati2; Chest- decr BS & few bibasilar rhonchi w/o w/r; Heart- RR gr1/6 SEM w/o r/g; Abd- soft, neg; Ext- VI, tr edema; Neuro- mild wkness. IMP/PLAN>>  We refilled XANAX 0.70m- take 1/2 tab bid & 1Qhs #60/mo;  We refilled PROAIR-HFA rescue inhaler, and rec trial AIRDUO 232-14 2spBid ($50 cash price from [employer];  We also refilled her TRAMADOL per request;  She will maintain f/u w/ DrBeekman, [name], [name], and her Gyn; she will seek a new PCP as I am going to retire later this yr...   ~  May 30, 2018:  387moOV & Jocelyn [name] returns stating that her breathing is the same- still feels SOB, can't get a deep breath, tight, can't get the air "IN";  She has a hx asthma/ AB/ inactive sarcoid - on AIBTDHRC1632spBid + ProairHFA rescue as needed;  She has multisystem disease (see above prob list) and followed by [name] for CARDS- HBP/ CAD- s/p [name] w/ thrombectomy & stent, HL- on ASA81, Brillinta, Metoprolol, CardizemCD, Repatha;  DrBeekman for RHEUM- polymyositis/ myopathy/ DJD/ DDD- on Acthar injections;  [name] for ENDOCRINE- DM2/ left adrenal adenoma/ VitB12 & VitD defic- on SynjardyXR, VitD, B12 shots;  She has also seen DrWolicki for hearing loss & tinnitus...  We reviewed the following medical problems during today's office visit>      AR/ AB/ Hx Sarcoid>  On AIAGTXMI6802spBid & Albut rescue inhaler prn & Flonase; we discussed Rx w/ MUCINEX 60028m1-2Bid w/ fluids...     HBP/ CAD-  s/p [name] 8/18 w/ cath thrombectomy & drug eluting stent in RCA>  Now on ASA81, Brillinta90Bid, Metop50Bid, CardizemCD240, Losar100, Lasix20prn;  BP= 134/76, no recurrent CP, no palpit, syncope, edema; followed by [name] & last seen 01/2018- no change in meds, rec to continue aggressive secondary prevention...    VI & chr stasis changes> on low salt diet, Lasix20 prn, compression hose; she was seen in the [facility] yrs ago by DrNEaton CorporationDrSevier...    Chol>  Followed in the Lip[place]x statin intol, prev in the ACCELERATE lipid lowering study in 2015, CPK elev & she was taken off the drug, she's also had myalgias from Zetia; currently on REPATHA shots Q14d;  LAB f/u FLP on Repatha 05/30/18 showed TChol 202, TG 181, HDL 56, LDL 109...    DM, Obesity>  Followed by [name], note 11/2017 reviewed, [facility] to [facility]-1000 one daily;  A1c= 6.8    GI- GERD, IBS>  On Protonix40, Zofran4 prn; prev followed by [name]- she had EGD & Colon 7/15; presented c/o ext hem- Rx w/ sitz, baby wipes, benefiberAnusolHC...    Rheum- Polymyositis, hx FM>  Dx & followed by DrBMarylouise Stacks ACTHAR MWF; last note 02/2018 reviewed, weakness & musc pain said to be better on the Acthar; MTX & Folic was also started, later DC'd... She is also c/o LBP & wants TRAMADOL refilled...     Anxiety>  On Xanax 0,5mg65m2-1 tab Tid...    VitB12 & VitD defic>  On B12 1000mc62m Qmo per [name] &  B12 level done 8/14 = 154 w/ f/u B12 level >1500 on the shots;  she tells me that [name] treated her low VitD w/ 50K weekly, then switched to daily OTC supplement, we do not have notes from DrKerPineville  Borderline anemia & low Ferritin>  she was treated w/ Fe from [name] & improved... EXAM shows Afeb, VSS, O2sat=95% on RA; Wt=175#, 5'2"Tall, BMI=32; Heent- neg, mallampati2;  Chest- decr BS & few bibasilar rhonchi w/o w/r; Heart- RR gr1/6 SEM w/o r/g; Abd- soft, neg; Ext- VI, tr edema; Neuro- mild wkness.  LABS  05/30/18>  FLP- ok on Repatha shots per CARDS;  Chems- ok w/ K=3.6, Cr=0.81, BS=118, LFTs wnl;  CBC- wnl w/ Hg=13.2;  TSH=0.41 IMP/PLAN>>  Ryen is stable but persists w/ mult somatic complaints attributed to mult organ systems- she is advised to continue her current meds and maintain f/u w/ her numerous medical specialists...   ~  August 16, 2018:  2-20moROV & add-on appt requested for dyspnea and chest discomfort>  Pt called 9/27 stating she'd been fighting congestion all week w/ cough, beige phlegm, no hemoptysis, incr SOB; no relief from Mucinex & AlbutHFA inhaler; didn't take temp but felt hot- she did not want appt & ZPak + Medrol Dosepak called in for pt... She presents to the office today c/o mostly dry cough, small amt sput, and chest discomfort esp on left side along sternal border & costal margin- sore & tender to touch, no known trauma etc;  She notes incr SOB walking in the house, feels tight & notes occas wheeze- says it's hard to get the air "IN" and "out"; not much improvement from the Zithromax or Medrol so far... She is folowed for general medical purposes w/ hx AR/ AB/ and remote hx sarcoidosis along w/ mult medical issues including polymyositis w/ myopathy managed by DGlean Salenon ACTHAR shots 3x/wk (she also had +PM-scl-70 suggesting poss polymyositis/scleroderma overlap syndrome)...  We reviewed the following interval medical notes in Epic>      She saw RHEUM- DrBeekman on 06/14/18>  F/u polymyositis w/ myopathy- had been tried on Pred, MTX, Imuran, and started on ACTHAR 04/2016; still c/o fatigue & weakness, enzymes had normalized on this med; f/u CPK sl elev at 236, CBC/ Chems ok x BS=123; same med continued and Cyclobenzaprine 584mQhs added for musc spasm...    She saw ENDOCRINE- [name] on 07/06/18>  followed for DM2, left adrenal adenoma, Vit B12 defic, VitD defic, Obesity; on SynjardyXR 25-1000 Qam;  A1c=6.8, VitB12=470,  VitD=22.7    She saw PULM-EWalsh,NP on 05/31/18>  Pt fell on the  day prior to appt trying to avoid a ?bee- hit left side, EMS called- didn't go to ER, pt is on Brillinta from Cards, had ecchymoses on left chest wall/ abd, hematoma on chin, & small knee abrasion; Chest was clear, exam otherw NEG; Labs OK; CT Head showed no acute intracranial abnormalities, small calcif meningioma along left vertex, atherosclerotic changes, no hemorrhage/ mass effect/ or CVA;  Treated w/ rest/ Tramadol/ Tylenol & improved slowly back to baseline... We reviewed the following medical problems during today's office visit>      AR/ AB/ Hx Sarcoid>  On AIMMHWKG8812spBid & Albut rescue inhaler prn & Flonase; we discussed Rx w/ MUCINEX 60063m1-2Bid w/ fluids... c/o incr cough, dyspnea, chest discomfort 08/16/18 - see additional work up below...    HBP/ CAD- s/p [name] 8/18 w/ cath thrombectomy & drug eluting stent in RCA>  Now on ASA81, Brillinta90Bid, Metop50Bid, CardizemCD240, Losar100, Lasix20prn;  BP= 130/80, no recurrent CP, no palpit, syncope, edema; followed by [name] & last seen 01/2018- no change in meds, rec to continue aggressive secondary prevention...    VI & chr stasis changes> on low salt diet, Lasix20 prn, compression hose; she was seen in the [facility] yrs ago by DrNEaton CorporationDrSevier...    Chol>  Followed in the [facility]id Clinic &  Cards- [name]; hx statin intol, prev in the ACCELERATE lipid lowering study in 2015, CPK elev & she was taken off the drug, she's also had myalgias from Zetia; currently on REPATHA shots Q14d;  LAB f/u FLP on Repatha 05/30/18 showed TChol 202, TG 181, HDL 56, LDL 109...    DM, Obesity>  Followed by [name], note 06/2018 reviewed, on SYNJARDY 2.03-999 one daily;  A1c= 6.8    GI- GERD, IBS>  On Protonix40, Zofran4 prn; prev followed by [name]- she had EGD & Colon 7/15; presented c/o ext hem- Rx w/ sitz, baby wipes, benefiberAnusolHC...    Rheum- Polymyositis, hx FM>  Dx & followed by Glean Salen- on ACTHAR twice weekly now; last note 05/2018 reviewed,  weakness & musc pain said to be better on the Acthar rx; She is also c/o LBP & wants TRAMADOL refilled- ok...     Meningioma>  She fell 05/2018 w/o serious injury but CT Head showed no acute intracranial abnormalities, small calcif meningioma along left vertex, atherosclerotic changes, no hemorrhage/ mass effect/ or CVA    Anxiety>  On Xanax 0.29m 1/2-1 tab Tid...    VitB12 & VitD defic>  On B12 10039m IM Qmo per [name] &  B12 level 154 in 2014, last done 8/19 = 470;  she tells me that [name] treated her low VitD w/ 50K weekly, then switched to daily OTC supplement 7 last VitD level =23...    Borderline anemia & low Ferritin>  she was treated w/ Fe from [name] & improved... EXAM shows Afeb, VSS, O2sat=96% on RA; Wt=172#, 5'2"Tall, BMI=32; Heent- neg, mallampati2; Chest- clear w/o w/w/r no dullness or consolidation, +chest wall tender to palpation; Heart- RR gr1/6 SEM w/o r/g; Abd- soft, neg; Ext- VI, tr edema; Neuro- mild wkness.  CXR 08/16/18>  Borderline heart size, abnormal opacity at left medial lung base ?atx, nothing seen on lateral view, no effusion noted, part compression T9 => suggest CT Chest for further eval (ordered & pending)...  Spirometry 08/16/18>  Poor effort & difficulty w/ testing procedure - FVC=0.7 (30%), FEV1=0.6 (31%), %1sec=79%, mid-flows=31% predicted... Invalid numbers.  Ambulatory Oximetry 08/16/18>  O2sat=96% on RA at rest w/ pulse=95/min;  She ambulated 3 laps in the office (185'each) w/ lowest O2sat=94% w/ pulse=104/min... No desaturations.  LABS 08/16/18>  Chems- ok w/ BS=123, BUN=26, Cr=0.93, BNP=36;  CBC- ok w/ Hg=14.6, WBC=11.0, Sed=35;  TSH=0.90 IMP/PLAN>>  DeJenyaas some incr cough, dyspnea, chest discomfort but not much to help w/ dx x some chest wall tenderness on exam=> we will proceed w/ CT Chest for further eval;  At this juncture we will change the ZPak/ Medrol dosepak to DOXY10059md x10d & PRED10m38mdTaper, and advise rest, heat, Tramadol+Tylenol for the  discomfort;  She has AIRDIWOEHO122Bid & ProairHFA rescue inhaler for prn use;  She understandably has some anxiety & rec incr Alprazolam0.5mg 17mm 1Qhs to add extra 1/2tab morn & afternoon; we will hopefully know more once the CT is done...    ~  August 30, 2018:  2wk ROV & post hospital follow up visit>  After her visit 10/2 w/ abn CXR we switched her empiric coverage to Doxy/ Pred taper but the very next day 1/3 she went to the ER & was ADM by FPTS w/ abn CXR/ CT scan felt to have LLL & lingular pneumonia and a parapneumonic effusion;  The CT scans also revealed a lobular mass-like thickening along the left upper mediastium ?etiology?  She was given Rocephin & Zithromax, and Pulmonary consulted-  Seen by DrYacoub who noted her cough, yellow/green sput, and left effusion w/ decr BS on left;  On Rocephin & Zithromax, he rec thoracentesis w/ repeat CT Chest after fluid removed;  Thoracentesis 10/4 yielded 1100cc of bloody fluid=> exudate, neg cytology, neg stains and culture... Repeat CT Chest on 08/18/18 showed:  IMPRESSION: 1. Unchanged pleural based mass in the medial left chest. 2. No pulmonary parenchymal mass is seen, although the left lower lobe remains collapsed. 3. No adenopathy or thymic mass. 4. Indeterminate left adrenal nodule measuring 15 mm, significance to be determined based on thoracentesis histology.       She was disch w/o antibiotics or Pred, prev Brilinta rx was HELD, other meds continued including Airduo232- 2spBid, ProairHFA prn, ACTHAR 80u 3x/wk as before, Vicodin5 vs Tramadol for pain or cough; rec for outpt f/u ASAP... She returns feeling no better & very anxious about her problem and lack of diagnosis; says she's had Temp to 101, chiils and sweats, mostly dry irritative cough, w/o sput, w/o blood; +left lat CWP & dyspnea; Exam revealed Afeb, VSS, dull w/ decr BS left base; we performed f/u CXR (see below for XRay & discussion)...  EXAM shows Afeb, VSS, O2sat=96% on RA; Wt=168#,  5'2"Tall, BMI=31-2; Heent- neg, mallampati2; Chest- clear w/o w/w/r, no consolidation but dull & decr BS left base, +chest wall tender to palpation; Heart- RR gr1/6 SEM w/o r/g; Abd- soft, neg; Ext- VI, tr edema; Neuro- mild wkness.  CXR 08/30/18>  Persistent prominent left pleural thickening/pleural base mass. Associated pleural effusion cannot be excluded. Persistent left base atelectasis. No change from prior exams  LABS 08/2018>  Chems- ok, BS=110-130, Cr=0.82, Alb=2.8, LFTs wnl;  CBC- Hg~10, WBC~11K  IMP/PLAN>>  Etiology is not apparent but clinical course most compatible w/ infection (viral vs bact), poss LLL pneumonia and parapneumonic effusion;  However the pleural surface has irreg thickening and poss mass-like thickening in medial LUL surface, thiis needs further evaluation & she is quite anxious;  REC to start back on empiric AUGMENTIN 872mBid and PREDNISONE tapering schedule;  We could consider PET scan, and discussion w/ radiology says they could do needle bx of the left medial apical pleural based mass;  I am concerned re: poss trapped lung & feel that TSurg consult w/ consideration of VATS exploration and pleurectomy may be the better approach=> TSurg consult placed...          Problems List:  MENIERE'S DISEASE (ICD-3 - eval by ENT, DrBates- Rx w/ diuretic, low salt, Xanax vs Valium, & Antivert Prn... [facility] recurred [facility] we don't have notes but pt reports that nothing was found.  ALLERGIC RHINITIS - increased allergy symptoms in the spring... rec> Claritin, Saline, Flonase...  Hx of ASTHMATIC BRONCHITIS, ACUTE  - requires occas Antibiotics and Medrol for infectious exac- last Oct09 & resolved w/ Avelox/ Depo/ Medrol... has not been on regular inhalers, but uses XOPENEX MDI (w/ spacer), MWeskanPrn... ~  essentially neg CTChest 9/08 after CXR suggested RULnodule. ~  CXR 10/09 showed cardiomegaly, clear lungs, min scoliosis, NAD. ~  CXR 9/11  showed cardiomeg, clear lungs, NAD..[name] ~  CXR 1/14 showed cardiomeg, clear lungs, no adenopathy, mild scoliosis... ~  CXR 4/15 showed mild cardiomeg, clear lungs, NAD... ~  PFT 10/15 showed FVC=1.93 (79%), FEV1=1.55 (79%), %1sec=80%, mid-flows=70% predicted...  ~  CXR showed mild cardiomeg & sl tortuous Ao, clear lungs, DDD of Tspine, NAD... ~  10/16:  We wrote for an AlbutHFA  inhaler for prn use at her request for wheezing... ~  12/16: presents w/ refractory AB- given Levaquin, already on Pred per Rheum, added Advair100Bid + Mucinex600-2Bid... ~  02/02/16: another bout of refractory AB- treated w/ Depo, Medrol taper, Augmentin, Mucinex, Proventil...  Hx of SARCOIDOSIS  - Dx'd 1980's w/ bronch bx... Rx Pred transiently and improved... no active disease x years... ~  baseline CXRs showed cardiomegaly, clear lungs, min scoliosis, NAD.  Hx of Mild OBSTRUCTIVE SLEEP APNEA - sleep eval DrClance 2006 w/ RDI= 5 only.   FOLLOWED by [facility] >>   HYPERTENSION, BENIGN  >> followeed by [name] now for CARDS...  ~  on TOPROL XL 90m- 1.5 tabs daily, CARDIZEM 2431md, LASIX 2043must as needed now... ~  prev OV's 130-140's / 80's... prev noted sl HA, chest tightness, sl SOB, & mult somatic complaints... Labs & renal- all WNL. ~  4/12:  BP=122/76 today, and similar at home per pt... She denies CP, palpit, SOB, edema, etc... Labs & renal- all WNL. ~  1/14: on ASA81, Imdur120, ToprolXL75, Cardizem240, Lasix20prn (seldom takes); BP=128/80 & she has mult somatic c/o- CWP, tired/no energy, but denies palpit, ch in SOB, edema, etc; followed by [facility]- DrTurner/ [name] & seen recently but we don't have note; they prev added RANEXA 500m15md for her CP (now off this med); we discussed trial Lunesta 2mgQ31mfor sleep & Tramadol 50mgT84mor pain... ~  9/14:  on ASA81, Imdur120, Toprol50-1/2Bid, Cardizem240, Lasix20prn (seldom takes); BP=132/74 & she has mult somatic c/o- CWP, tired/no energy,  but denies palpit, ch in SOB, edema, etc; followed by [facility]- DrTurner/ [name], no recent notes ~  4/15: on ASA81, Toprol50-1/2Bid, Cardizem240, Lasix20prn (ave 1/d) & ?Amlodip5 from [place]?;[employer]124/76 & she has mult somatic c/o- CWP, tired/no energy, but denies palpit, ch in SOB, edema, etc; followed by [facility]- [name], no recent notes. ~  4/16: on ASA81, Metop25Bid, CardizemCD240, Losar100, Lasix40; BP= 146/88 and reminded to elim sodium & get weight down...  CAD- s/p [name] 8/18 w/ thrombectomy/stent by [name] - prev followed by DrTTurner... ~  Cath in 2000 showed non-obstructive CAD (20-30% lesions in all 3 vessels) w/ rec for risk factor modification.   ~  NuclearStressTest 4/07 showed no ischemia & EF=73%. ~  Mar10:  she's concerned about BP and chest tightness, requests Cardiac eval DrTurner & we will refer... ~  2DEcho 4/10 showed norm LVF w/ EF=55-60%, norm MV, norm AoV, Gr1DD ~  4/10 eval by DrTurner w/ MYOVIEW- neg- breast attenuation, no regional wall motion abn, EF= 75% ~  4/10 Cath by DrTurner w/ 90% small 2nd diag branch of LAD <too sm for PTCA> & luminal irreg up to 20% in RCA, +DD- Med Rx. ~  She had more chest tightness & another Myoview from DrTurner 8/12- it too was neg, no ischemia, no infarct; +breast attenuation; EF=70% & nno regional wall motion abnormalities;  IMDUR has been incr to 120mg/d39m 1/14: she [facility]'t have notes from him> EKG sent showed NSR, rate72, wnl, NAD... ~  EKG 5/14 showed NSR, rate 93, WNL, NAD... ~  12/14: she saw [name] for Cards f/u (note reviewed)> HBP, CAD, Chol; off Imdur w/o angina, continue same meds including both Cardizem & Amlodipine, encouraged to continue [employer] study... ~  She continues regular f/u visits w/ [name], CARDS> his notes are reviewed... ~  08/22/17>  Now on ASA81, Brillinta90Bid, Metop50Bid, CardizemCD240, Losar100, Lasix20prn +  Repatha; followed by  [name] & [name]  VENOUS INSUFFICIENCY - she was referred to [facility] foot clinic by DrWWoods and seen by DrSevier 6/08= ven insuffic Rx w/ TED's, no salt, elevation, etc. ~  10/10: notes bilat ankle ?lipoma in front of the lat malleoli- asymptomatic but several people have noticed them and she request refer to Ortho (rec DrBednarz when she is ready).  HYPERCHOLESTEROLEMIA  - on LIPITOR 65m/d & ZETIA 128md + CoQ10 per DrTurner... Diet & exercise stressed to the pt. ~  FLP here 12/07 on Lip10 showed TChol 156, TG 94, HDL 51, LDL 86 ~  FLP 1/09 on Lip10 showed TChol 157, TG 122, HDL 49, LDL 84 ~  [facility] showed TChol 176, TG 116, HDL 55, LDL 98 ~  FLP 3/10 on Lip10 showed TChol 170, TG 94, HDL 59, LDL 93 ~  FLP 6/10 by DrTurner on Lip20 showed TChol 134, TG 82, HDL 55, LDL 73 ~  FLP 4/11 here on Lip20+Zetia? showed TChol 145, TG 74, HDL 68, LDL 63 ~  9/11:  she reports that DrTurner incr Lipitor to 4017mplus the Zetia 35m65mnd she does her FLPs. ~  FLP here 4/12 on Lip40+Zetia showed TChol 153, TG 54, HDL 53, LDL 90... Copy sent to DrTurner ~  FLP [facility]ie[place]DrKerr 7/13 on Lip40+Zetia10 showed TChol 137, TG 89, HDL 42, LDL 73 ~  9/14: on Lip40 => [place] from [place] ?Lip80 +Pharm[employer] study drug?; she has gained 4# to 190# today; [facility] & she reports concern for "particle size" & last avail FLP was 7/14 by [name] w/ TChol 190, HDL 100, LDL 40 ~  FLP followed by [name] for CARDS, and [name] for Endocrine... ~  Now on REPATHA shots Q14d...   FOLLOWED by [name] FOR ENDOCRINOLOGY >>   DIABETES MELLITUS  - on Metformin 500mg83m+ PARLODEL 2.5mg/d24mr DrEllison... She is intol to Actos w/ edema, she stopped Januvia due to ?side effect, she wondered about Tajenta since her daugh works for [employer] 9/08 showed FBS=119, HgA1c=6.6 ~  [facility]= 108, HgA1c= 6.5 ~  labs 3/10 showed BS= 113, A1c= 6.6 ~  labs 6/10 by  DrTurner showed BS= 92; and 3/11 BS= 127 ~  labs 4/11 showed BS= 124, A1c= 7.6... recMarland Kitchen incr Metform to Bid; she [facility]... ~  6/11:  pt requested Endocrine consult & seen by DrEllison w/ Januvia 100mg/d72med. ~  labs 9/11 showed BS= 98, A1c= 7.3... she Marland KitchenMarland Kitchenopped Januvia due to side effects & wonders about using Tajenta instead. ~  DrEllison started Bromocy[employer]ers Insuranced..45mhe is now taking this +Metform 500mgBid.44m  Labs 4/12 (wt=195#) showed Bs= 116, A1c= 7.1 ~  Labs 7/12 showed A1c= 7.2;  Umicroalb= 3.5 ~  Labs 10/12 (wt=186#) showed A1c= 6.7 NOTE: She participated in a [facility] trial called the AfricanAmerican- Diabetes Heart Study in 2012; they sent a report indicating: 1) her memory was fine, but her MRI Brain showed sm vessel dis & mild sinus dis, otherw neg; 2) she may have treatable depression but we tried her on Zoloft & she was intol==> ch to Lexapro. ~  followed by [name] on Metform500Bid & Onglyza2.5; labs from [name] 9/[place]wed BS=201, A1c=6.0; he rec same meds... ~  9/14: followed by [name] on Metform500Bid & Onglyza2.5 => ?he changed to Kombiglyze5-1000 daily?; last labs from [name] 7/Micro  showed BS=105, A1c=6.5; and she remains on the same meds... ~  1/15: she had f/u [name] for Endocrine> DM, Obesity, Adrenal adenoma, VitB12 defic; labs reviewed, note reviewed- he tried Belviq but she was intol... ~  She continues regular follow up w/ [name]... ~  Switched to [facility] 08-999 Qam after her [name] 8/18... A1c=6.8   FOLLOWED by [name] et al for GASTORENTEROLOGY >>   GERD SYMPTOMS - pt placed on PROTONIX 65m/d w/ improvement in reflux symptoms...    ADDENDUM>> note from [name] indicates> upper endoscopy in July 1994 showed antral gastritis. 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  IRRITABLE BOWEL SYNDROME  - colonoscopy 7/03 by [name] was WNL. ~  9/14: she was referred to GI for f/u colon but  never did it... ~  4/15: we will refer her to GI again...  RENAL CALCULUS  - sm stone seen in L kidney on CTScan... [facility].  POLYMYOSITIS >> now followed by DrBeekman FIBROMYALGIA  - prev Rx by [name] in 2006... ~  1/14: This is her CC w/ not resting well, wakes tired, poor energy, aching/ sore/ tender (esp in chest in AM) & we discussed trial Lunesta 250mhs, & Tramadol 5072md; plus rest, heat, etc... States she's INTOL Ambien w/ weird dreams & Benedryl/ Melatonin w/o help... ~  11/14: she went to the ER w/ LBP> felt to be a lumbar strain... ~  She was seen by [name] for Rheum [id number]  ~  2016> DenLangley Gaussw [name] in Neuro- felt to have a myopathy and labs showed elev CPK (as hi as 1860, and a pos PM-Scl 100 antibody;  Clinically she had musc aches and some falling episodes, some numbness & tingling, no skin changes;  EMG/NCV was performed (most consistent with a non-necrotizing myopathy affecting the proximal leg muscles)- [facility] who felt she has polymyositis or a PM/scleroderma overlap syndrome, he wanted to get a musc bx but decided to start Rx rather than wait & she is now on Pred & MTX w/ Folic  VITAMIN D DEFICIENCY - [facility]... supplemented w/ OTC Vit D 1000 u daily. ~  Vit D level was lower from [name] & he treated w/ Vit D 50K weekly for awhile, then switched to OTC supplement...  ANXIETY - on ALPRAZOLAM 0.5mg1mn... DEPRESSION - prev on Lexapro 20mg74mut she stopped on her own ~10/13 ?cost issues? ~  4/12: c/o feeling sad & anxious all the time- weak, not resting well, under a lot of stress; rec to seek counselling thru her local church or [facility]/ Zoloft 50==> but she was INTOL... ~  2013: switched to Lexapro & seems to be doing better but didn't stick w/ it due to cost issues...  ANEMIA & LOW FERRITIN LEVEL >>  B12 DEFICIENCY >> treated by [name]  w/ B12 shots ~  9/14: she tells me that Ferritin is low & [name] tried her on Oral Fe prep w/o improvement & wants me to f/u and treat this problem; we discussed need to recheck labs here- LABS 9/14 showed Hg=12.5, MCV=88, Fe=47 (12%sat), Ferritin=10.8; she is due for f/u colon w/ [name] & we will refer, rec to start FeSO4 325mg 27mw/ VitC500 w/ plans for f/u CBC, Fe studies in 2-21mo; ~69mo15:  Labs showed Hg= 12.5 but Fe=31 (8%); she was only taking the Fe  once daily 7 advised to incr to Bid w/ vitC500; needs f/u colon & we will refer to [name] again...  HEALTH MAINTENANCE: ~  GI:  Followed by [name] & colon due 7/13... ~  GYN:  Followed by DrDillard & she gets yearly PAP, Mammogram, hasn't had baseline BMD yet, started on [facility] 5/13... ~  Immunizations:  She gets the yearly Flu shots at school;  Had PNEUMOVAX previously; TDAP given 4/12...   Past Surgical History:  Procedure Laterality Date  . CORONARY STENT INTERVENTION N/A 06/15/2017   Procedure: CORONARY STENT INTERVENTION;  Surgeon: Jettie Booze, MD;  Location: [place] [facility];  Service: Cardiovascular;  Laterality: N/A;  . heart catherization    . LEFT HEART CATH AND CORONARY ANGIOGRAPHY N/A 06/15/2017   Procedure: LEFT HEART CATH AND CORONARY ANGIOGRAPHY;  Surgeon: Jettie Booze, MD;  Location: [place] [facility];  Service: Cardiovascular;  Laterality: N/A;  . TONSILLECTOMY    No other past surgical history on file - other than Lithotripsy for renal stone...   Outpatient Encounter Medications as of 08/30/2018  Medication Sig  . albuterol (PROAIR HFA) 108 (90 Base) MCG/ACT inhaler Inhale 1-2 puffs into the lungs every 6 (six) hours as needed for wheezing or shortness of breath.  . ALPRAZolam (XANAX) 0.5 MG tablet 1/2 morning, 1/2 in afternoon, and 1 at bedtime  . aspirin 81 MG chewable tablet Chew 1 tablet (81 mg total) by mouth daily.  [name] CARTIA XT 240 MG 24 hr capsule TAKE 1 CAPSULE(240 MG) BY MOUTH  DAILY  . Cyanocobalamin 1000 MCG/ML KIT Inject 1,000 mg as directed every 30 (thirty) days.  . diclofenac sodium (VOLTAREN) 1 % GEL Apply 2 g topically 4 (four) times daily.  . Empagliflozin-metFORMIN HCl ER (SYNJARDY XR) 25-1000 MG TB24 Take 1 tablet by mouth daily.  . fluticasone (FLONASE) 50 MCG/ACT nasal spray Place 2 sprays into both nostrils at bedtime. (Patient taking differently: Place 2 sprays into both nostrils daily as needed for allergies or rhinitis. )  . Fluticasone-Salmeterol (AIRDUO RESPICLICK 161/09) 604-54 MCG/ACT AEPB Inhale 2 puffs into the lungs 2 (two) times daily.  . furosemide (LASIX) 20 MG tablet Take 20 mg by mouth daily as needed for fluid or edema.  [name] HP ACTHAR 80 UNIT/ML injectable gel Inject 80 Units into the skin 3 (three) times a week.   . losartan (COZAAR) 100 MG tablet TAKE 1 TABLET BY MOUTH ONCE DAILY  . metoprolol tartrate (LOPRESSOR) 50 MG tablet TAKE 1 TABLET BY MOUTH TWICE A DAY  . nitroGLYCERIN (NITROSTAT) 0.4 MG SL tablet Place 1 tablet (0.4 mg total) under the tongue every 5 (five) minutes as needed for chest pain.  [name] ondansetron (ZOFRAN-ODT) 4 MG disintegrating tablet Take 4 mg by mouth every 8 (eight) hours as needed for nausea or vomiting.  . pantoprazole (PROTONIX) 40 MG tablet take 1 tablet by mouth once daily  . REPATHA SURECLICK 098 MG/ML SOAJ INJECT 1 PEN INTO THE SKIN EVERY 14 (FOURTEEN) DAYS. REFRIGERATE.  [name] traMADol (ULTRAM) 50 MG tablet Take 1 tablet (50 mg total) by mouth 3 (three) times daily as needed.  . valACYclovir (VALTREX) 500 MG tablet Take 500 mg by mouth daily.   [name] amoxicillin-clavulanate (AUGMENTIN) 875-125 MG tablet Take 1 tablet by mouth 2 (two) times daily.  . [DISCONTINUED] guaiFENesin-codeine (CHERATUSSIN AC) 100-10 MG/5ML syrup Take 5 mLs by mouth every 4 (four) hours as needed for cough.  . [DISCONTINUED] HYDROcodone-guaiFENesin 2.5-200 MG/5ML SOLN Take 5 mLs by mouth  every 4 (four) hours as needed.  . [DISCONTINUED]  predniSONE (DELTASONE) 10 MG tablet Take 1 39m tab twice per dayx's 3, then 1 269mtab every morning until done   No facility-administered encounter medications on file as of 08/30/2018.     Allergies  Allergen Reactions  . Actos [Pioglitazone] Other (See Comments)    REACTION: pt states INTOL w/ edema  . Codeine Other (See Comments)    REACTION: vomiting  . Januvia [Sitagliptin] Nausea Only  . Lactose Intolerance (Gi) Diarrhea  . Morphine Nausea Only and Nausea And Vomiting    REACTION: vomiting REACTION: vomiting    Immunization History  Administered Date(s) Administered  . Influenza Split 08/16/2011, 08/16/2012, 08/16/2013, 09/04/2014  . Influenza Whole 09/16/2009, 08/15/2010  . Influenza,inj,Quad PF,6+ Mos 08/30/2016, 06/16/2017  . Influenza-Unspecified 08/19/2015  . Pneumococcal Polysaccharide-23 11/15/2004  . Tdap 02/22/2011    Current Medications, Allergies, Past Medical History, Past Surgical History, Family History, and Social History were reviewed in [employer].   Review of Systems        See HPI - all other systems neg except as noted...      The patient complains of weight gain, dyspnea on exertion, and peripheral edema.  The patient denies anorexia, fever, weight loss, vision loss, decreased hearing, hoarseness, chest pain, syncope, prolonged cough, headaches, hemoptysis, abdominal pain, melena, hematochezia, severe indigestion/heartburn, hematuria, incontinence, genital sores, suspicious skin lesions, transient blindness, difficulty walking, depression, unusual weight change, abnormal bleeding, enlarged lymph nodes, and angioedema.     Objective:   Physical Exam     WD, Overweight, 6328/o BF in NAD... GENERAL:  Alert & oriented; pleasant & cooperative. HEENT:  [place]/AT, EOM-wnl, PERRLA, EACs-clear, TMs-wnl, NOSE-clear, THROAT-clear & wnl. NECK:  Supple w/ fairROM; no JVD; normal carotid impulses w/o bruits; no thyromegaly or  nodules palpated; no lymphadenopathy. CHEST:  decrBS at bases, few scar rhonchi & end-exp wheezing, no rales, no consolidation... HEART:  Regular Rhythm; without murmurs/ rubs/ or gallops. ABDOMEN:  Soft & nontender; normal bowel sounds; no organomegaly or masses detected. EXT: without deformities, mild arthritic changes; no varicose veins but +venous insuffic & tr edema; +trigger points. NEURO:  CN's intact;  no focal neuro deficits... DERM:  No lesions noted; no rash etc...  RADIOLOGY DATA:  Reviewed in the EPIC EMR & discussed w/ the patient...  LABORATORY DATA:  Reviewed in the EPIC EMR & discussed w/ the patient...   Assessment & Plan:    RESP>  Hx AR, HxAsthma, old sarcoid, mild OSA>  All stable, breathing OK, notes some wheezing w/ activity; exam reveals some secretions in the airway & rec to take MUCINEX 120052mid, Fluids, etc;. CXR remains stable/ NAD... 10/20/15>  Presented w/ refractory AB- already on Pred- given Depo80, adding Levaquin500 x7d, ADVAIR100Bid, Mucinex600-2Bid + fluids... 02/02/16>  Presents w/ another refractory AB- currently off Pred & MTX, on Imuran; treated w/ Depo, Medrol taper, Augmentin, Mucinex, Albut=HFA...  04/2016>  Back to baseline 11/04/16>  She has URI & we wrote for ZPak => subseq Augmentin, Saline, flonase, Mucinex... 05/05/17>   DenLachell improved s/p Augmentin & Pred Rx for her URI & AB exac;  She remains on Symbicort80-2spBid + Mucinex600- 1to2Bid w/ extra fluids;  I reminded her to be sure to requests notes sent to us [place] they are not on Epic;  She will maintain f/u w/ these specialists + [name] for CARDS;  We plan recheck in 3-48mo68mo/19>   We  refilled XANAX 0.25m- take 1/2 tab bid & 1Qhs #60/mo;  We refilled PROAIR-HFA rescue inhaler, and rec trial AIRDUO 232-14 2spBid ($50 cash price from [employer];  We also refilled her TRAMADOL per request;  She will maintain f/u w/ DrBeekman, [name], [name], and her GConcha [place] she will seek  a new PCP as I am going to retire later this yr. 05/30/18>   DMellais stable but persists w/ mult somatic complaints attributed to mult organ systems- she is advised to continue her current meds and maintain f/u w/ her numerous medical specialists...  08/16/18>   Jocelyn [name] some incr cough, dyspnea, chest discomfort but not much to help w/ dx x some chest wall tenderness on exam=> we will proceed w/ CT Chest for further eval;  At this juncture we will change the ZPak/ Medrol dosepak to DOXY1051mid x10d & PRED1033m4dTaper, and advise rest, heat, Tramadol+Tylenol for the discomfort;  She has AIRELFYBO175pBid & ProairHFA rescue inhaler for prn use;  She understandably has some anxiety & rec incr Alprazolam0.5mg35mom 1Qhs to add extra 1/2tab morn & afternoon; we will hopefully know more once the CT is done. 08/30/18>   Etiology is not apparent but clinical course most compatible w/ infection (viral vs bact), poss LLL pneumonia and parapneumonic effusion;  However the pleural surface has irreg thickening and poss mass-like thickening in medial LUL surface, thiis needs further evaluation & she is quite anxious;  REC to start back on empiric AUGMENTIN 875mg69mand PREDNISONE tapering schedule;  We could consider PET scan, and discussion w/ radiology says they could do needle bx of the left medial apical pleural based mass;  I am concerned re: poss trapped lung & feel that TSurg consult w/ consideration of VATS exploration and pleurectomy may be the better approach=> TSurg consult placed   CARDS>  Followed by DrTTurner/ VaranRica Motemeds listed? SHE DID NOT BRING MEDS TO THE OV> Hx HBP, CAD, VI, etc;  Chest pain is felt to be non-cardiac & likely related to her FM; Doing satis & we reviewed exercise program etc...  CHOL>  Managed by DrVarEfthemios Raphtis Md PcCards- intol Lip + Zetia; she took part in a study drug from [facility] => now [facility]UENT [facility]>  Followed by [name] on Metformin, Onglyzal;  A1c improved to 6.7 in 2012, then 6.0 in 2013, & 6.5  In 2014;  rec continue meds & asked to have records sent to us...[place]>  Stable on Protonix as needed; see GI eval from [name] 7/15...  POLYMYOSITIS/ FM>  Diagnoses w/ Polymyositis recently (2016) & started on Pred, MTX, folate by DrBeekman=> switched to ACTHAAMR Corporationinsurance doesn't want to pay, she is reminded to have records sent to us...[place]Anxiety>  She remains on Alprazolam as needed; & she stopped the Lexapro..  Anemia, Low Ferritin> on FeSO4 325mg 74mtC 500...   B12 Defic> on B12 shots per [name]...  ANXIETY>  Jocelyn [name] having progressive difficulties at work ([employer] to this stress and her mult medical issues including polymyositis (DrBeekman), DM2 ([name]), heart problems ([name])- she tells me she is taking a LOA for the rest of this school yr & planning on retirement...   Patient's Medications  New Prescriptions   AMOXICILLIN-CLAVULANATE (AUGMENTIN) 875-125 MG TABLET    Take 1 tablet by mouth 2 (two) times daily.   HYDROCODONE-HOMATROPINE (HYCODAN) 5-1.5 MG/5ML SYRUP    Take 5 mLs by mouth every 4 (  four) hours as needed for cough.   PREDNISONE (DELTASONE) 20 MG TABLET    Take one tab twice daily x3days, then one tab daily until return back to office  Previous Medications   ALBUTEROL (PROAIR HFA) 108 (90 BASE) MCG/ACT INHALER    Inhale 1-2 puffs into the lungs every 6 (six) hours as needed for wheezing or shortness of breath.   ALPRAZOLAM (XANAX) 0.5 MG TABLET    1/2 morning, 1/2 in afternoon, and 1 at bedtime   ASPIRIN 81 MG CHEWABLE TABLET    Chew 1 tablet (81 mg total) by mouth daily.   CARTIA XT 240 MG 24 HR CAPSULE    TAKE 1 CAPSULE(240 MG) BY MOUTH DAILY   CYANOCOBALAMIN 1000 MCG/ML KIT    Inject 1,000 mg as directed every 30 (thirty) days.   DICLOFENAC SODIUM (VOLTAREN) 1 % GEL    Apply 2 g topically 4 (four) times daily.   EMPAGLIFLOZIN-METFORMIN HCL ER (SYNJARDY XR)  25-1000 MG TB24    Take 1 tablet by mouth daily.   FLUTICASONE (FLONASE) 50 MCG/ACT NASAL SPRAY    Place 2 sprays into both nostrils at bedtime.   FLUTICASONE-SALMETEROL (AIRDUO RESPICLICK 431/54) 008-67 MCG/ACT AEPB    Inhale 2 puffs into the lungs 2 (two) times daily.   FUROSEMIDE (LASIX) 20 MG TABLET    Take 20 mg by mouth daily as needed for fluid or edema.   HP ACTHAR 80 UNIT/ML INJECTABLE GEL    Inject 80 Units into the skin 3 (three) times a week.    LOSARTAN (COZAAR) 100 MG TABLET    TAKE 1 TABLET BY MOUTH ONCE DAILY   METOPROLOL TARTRATE (LOPRESSOR) 50 MG TABLET    TAKE 1 TABLET BY MOUTH TWICE A DAY   NITROGLYCERIN (NITROSTAT) 0.4 MG SL TABLET    Place 1 tablet (0.4 mg total) under the tongue every 5 (five) minutes as needed for chest pain.   ONDANSETRON (ZOFRAN-ODT) 4 MG DISINTEGRATING TABLET    Take 4 mg by mouth every 8 (eight) hours as needed for nausea or vomiting.   PANTOPRAZOLE (PROTONIX) 40 MG TABLET    take 1 tablet by mouth once daily   REPATHA SURECLICK 619 MG/ML SOAJ    INJECT 1 PEN INTO THE SKIN EVERY 14 (FOURTEEN) DAYS. REFRIGERATE.   TRAMADOL (ULTRAM) 50 MG TABLET    Take 1 tablet (50 mg total) by mouth 3 (three) times daily as needed.   VALACYCLOVIR (VALTREX) 500 MG TABLET    Take 500 mg by mouth daily.   Modified Medications   No medications on file  Discontinued Medications   HYDROCODONE-GUAIFENESIN 2.5-200 MG/5ML SOLN    Take 5 mLs by mouth every 4 (four) hours as needed.   PREDNISONE (DELTASONE) 10 MG TABLET    Take 1 44m tab twice per dayx's 3, then 1 268mtab every morning until done

## 2018-09-04 ENCOUNTER — Encounter (HOSPITAL_COMMUNITY): Payer: Self-pay

## 2018-09-06 ENCOUNTER — Ambulatory Visit: Payer: BC Managed Care – PPO | Admitting: Pulmonary Disease

## 2018-09-06 ENCOUNTER — Other Ambulatory Visit: Payer: Self-pay | Admitting: Pulmonary Disease

## 2018-09-06 ENCOUNTER — Encounter (HOSPITAL_COMMUNITY): Payer: Self-pay

## 2018-09-06 ENCOUNTER — Ambulatory Visit (INDEPENDENT_AMBULATORY_CARE_PROVIDER_SITE_OTHER)
Admission: RE | Admit: 2018-09-06 | Discharge: 2018-09-06 | Disposition: A | Discharge status: Home / Self Care | Payer: BC Managed Care – PPO | Source: Ambulatory Visit | Attending: Pulmonary Disease | Admitting: Pulmonary Disease

## 2018-09-06 ENCOUNTER — Encounter: Payer: Self-pay | Admitting: *Deleted

## 2018-09-06 ENCOUNTER — Encounter: Payer: Self-pay | Admitting: Pulmonary Disease

## 2018-09-06 VITALS — BP 128/76 | HR 81 | Temp 98.1°F | Ht 62.0 in | Wt 172.8 lb

## 2018-09-06 DIAGNOSIS — J189 Pneumonia, unspecified organism: Secondary | ICD-10-CM

## 2018-09-06 DIAGNOSIS — R9389 Abnormal findings on diagnostic imaging of other specified body structures: Secondary | ICD-10-CM

## 2018-09-06 DIAGNOSIS — Z862 Personal history of diseases of the blood and blood-forming organs and certain disorders involving the immune mechanism: Secondary | ICD-10-CM | POA: Diagnosis not present

## 2018-09-06 DIAGNOSIS — I252 Old myocardial infarction: Secondary | ICD-10-CM

## 2018-09-06 DIAGNOSIS — J90 Pleural effusion, not elsewhere classified: Secondary | ICD-10-CM

## 2018-09-06 DIAGNOSIS — J181 Lobar pneumonia, unspecified organism: Secondary | ICD-10-CM

## 2018-09-06 DIAGNOSIS — I2510 Atherosclerotic heart disease of native coronary artery without angina pectoris: Secondary | ICD-10-CM

## 2018-09-06 DIAGNOSIS — F411 Generalized anxiety disorder: Secondary | ICD-10-CM

## 2018-09-06 DIAGNOSIS — M3320 Polymyositis, organ involvement unspecified: Secondary | ICD-10-CM

## 2018-09-06 DIAGNOSIS — I10 Essential (primary) hypertension: Secondary | ICD-10-CM

## 2018-09-06 DIAGNOSIS — J948 Other specified pleural conditions: Secondary | ICD-10-CM | POA: Insufficient documentation

## 2018-09-06 DIAGNOSIS — E119 Type 2 diabetes mellitus without complications: Secondary | ICD-10-CM

## 2018-09-06 DIAGNOSIS — E7800 Pure hypercholesterolemia, unspecified: Secondary | ICD-10-CM

## 2018-09-06 DIAGNOSIS — Z955 Presence of coronary angioplasty implant and graft: Secondary | ICD-10-CM

## 2018-09-06 MED ORDER — AMOXICILLIN-POT CLAVULANATE 875-125 MG PO TABS: 1.0000 | ORAL_TABLET | Freq: Two times a day (BID) | ORAL | 0 refills | Status: DC

## 2018-09-06 MED ORDER — PREDNISONE 10 MG PO TABS: ORAL_TABLET | ORAL | 0 refills | Status: DC

## 2018-09-06 NOTE — Progress Notes (Addendum)
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 during today's office visit>      AR/ AB/ Hx Sarcoid>  On Dulera100-2spBid (but ran out- too $$)& Albut rescue inhaler prn & Flonase; we discussed Rx w/ MUCINEX 600mg - 1-2Bid w/ fluids...     HBP/ CAD- s/p IWMI 8/18 w/ cath thrombectomy & drug eluting stent in RCA>  Now on ASA81, Brillinta90Bid, Metop50Bid, CardizemCD240, Losar100, Lasix20prn;  BP= 126/74, no recurrent CP, no palpit, syncope, edema; followed by [name] & last seen 07/08/17- post hosp check & no change in meds, rec to continue aggressive secondary prevention...    VI & chr stasis changes> on low salt diet, Lasix20 prn, compression hose; she was seen in the [facility] wound clinic yrs ago by [employer] & DrSevier...    Chol>  Followed in the [facility]- [name]; hx statin intol, prev in the ACCELERATE lipid lowering study in 2015, CPK elev & she was taken off the drug, she's also had myalgias from Zetia; currently on REPATHA shots Q14d;  LAB f/u FLP on Repatha 08/18/16 showed TChol 191, TG 214, HDL 41, LDL 107...    DM, Obesity>  Followed by [name], note 07/21/17 reviewed, switched to [facility] 11-998 one daily;  A1c= 6.8    GI- GERD, IBS>  On Protonix40, Zofran4 prn; prev followed by [name]- she had EGD & Colon 7/15; presented c/o ext hem- Rx w/ sitz, baby wipes, benefiberAnusolHC...    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 We will call for updated notes from  him... She is also c/o LBP & wants TRAMADOL refilled...     Anxiety>  On Xanax 0,5mg  1/2-1 tab Tid...    VitB12 & VitD defic>  On B12 IM Qmo per [name] &  B12 level done 8/14 = 154 w/ f/u B12 level >1500 on the shots;  she tells me that [name] treated her low VitD w/ 50K weekly, then switched to daily OTC supplement, we do not have notes from [name]...    Borderline anemia & low Ferritin>  she was treated w/ Fe from [name] & improved... EXAM shows Afeb, VSS, O2sat=99% on RA; Wt=173#, 5'2"Tall, BMI=32; Heent-  neg, mallampati2; Chest- decr BS & few bibasilar rhonchi w/o w/r; Heart- RR gr1/6 SEM w/o r/g; Abd- soft, neg; Ext- VI, tr edema; Neuro- mild wkness. IMP/PLAN>>  We refilled XANAX 0.5mg - take 1/2 tab bid & 1Qhs #60/mo;  We refilled PROAIR-HFA rescue inhaler, and rec trial AIRDUO 232-14 2spBid ($50 cash price from [employer]);  We also refilled her TRAMADOL per request;  She will maintain f/u w/ DrBeekman, [name], [name], and her Gyn; she will seek a new PCP as I am going to retire later this yr...   ~  May 30, 2018:  39mo ROV & Jocelyn [name] returns stating that her breathing is the same- still feels SOB, can't get a deep breath, tight, can't get the air "IN";  She has a hx asthma/ AB/ inactive sarcoid - on WUJWJX914- 2spBid + ProairHFA rescue as needed;  She has multisystem disease (see above prob list) and followed by [name] for CARDS- HBP/ CAD- s/p IWMI w/ thrombectomy & stent, HL- on ASA81, Brillinta, Metoprolol, CardizemCD, Repatha;  DrBeekman for RHEUM- polymyositis/ myopathy/ DJD/ DDD- on Acthar injections;  [name] for ENDOCRINE- DM2/ left adrenal adenoma/ VitB12 & VitD defic- on SynjardyXR, VitD, B12 shots;  She has also seen DrWolicki for hearing loss & tinnitus...  We reviewed the following medical problems during today's office visit>      AR/ AB/ Hx Sarcoid>  On NWGNFA213- 2spBid & Albut rescue inhaler prn & Flonase; we discussed Rx w/ MUCINEX 600mg - 1-2Bid w/ fluids...     HBP/ CAD-  s/p IWMI 8/18 w/ cath thrombectomy & drug eluting stent in RCA>  Now on ASA81, Brillinta90Bid, Metop50Bid, CardizemCD240, Losar100, Lasix20prn;  BP= 134/76, no recurrent CP, no palpit, syncope, edema; followed by [name] & last seen 01/2018- no change in meds, rec to continue aggressive secondary prevention...    VI & chr stasis changes> on low salt diet, Lasix20 prn, compression hose; she was seen in the [facility] yrs ago by [employer] & DrSevier...    Chol>  Followed in the [facility]- [name]; hx statin intol, prev in the ACCELERATE lipid lowering study in 2015, CPK elev & she was taken off the drug, she's also had myalgias from Zetia; currently on REPATHA shots Q14d;  LAB f/u FLP on Repatha 05/30/18 showed TChol 202, TG 181, HDL 56, LDL 109...    DM, Obesity>  Followed by [name], note 11/2017 reviewed, switched to [facility] 2.03-999 one daily;  A1c= 6.8    GI- GERD, IBS>  On Protonix40, Zofran4 prn; prev followed by [name]- she had EGD & Colon 7/15; presented c/o ext hem- Rx w/ sitz, baby wipes, benefiberAnusolHC...    Rheum- Polymyositis, hx FM>  Dx & followed by Sandre Kitty on ACTHAR MWF; last note 02/2018 reviewed, weakness & musc pain said to be better on the Acthar; MTX & Folic was also started, later DC'd... She is also c/o LBP & wants TRAMADOL refilled...     Anxiety>  On Xanax 0,5mg  1/2-1 tab Tid...    VitB12 & VitD defic>  On B12 IM Qmo per [name] &  B12 level done 8/14 = 154 w/ f/u B12 level >1500 on the shots;  she tells me that [name] treated her low VitD w/ 50K weekly, then switched to daily OTC supplement, we do not have notes from [name]...    Borderline anemia & low Ferritin>  she was treated w/ Fe from [name] & improved... EXAM shows Afeb, VSS, O2sat=95% on RA; Wt=175#, 5'2"Tall, BMI=32; Heent- neg, mallampati2;  Chest- decr BS & few bibasilar rhonchi w/o w/r; Heart- RR gr1/6 SEM w/o r/g; Abd- soft, neg; Ext- VI, tr edema; Neuro- mild wkness.  LABS  05/30/18>  FLP- ok on Repatha shots per CARDS;  Chems- ok w/ K=3.6, Cr=0.81, BS=118, LFTs wnl;  CBC- wnl w/ Hg=13.2;  TSH=0.41 IMP/PLAN>>  Tamra is stable but persists w/ mult somatic complaints attributed to mult organ systems- she is advised to continue her current meds and maintain f/u w/ her numerous medical specialists...   ~  August 16, 2018:  2-27mo ROV & add-on appt requested for dyspnea and chest discomfort>  Pt called 9/27 stating she'd been fighting congestion all week w/ cough, beige phlegm, no hemoptysis, incr SOB; no relief from Mucinex & AlbutHFA inhaler; didn't take temp but felt hot- she did not want appt & ZPak + Medrol Dosepak called in for pt... She presents to the office today c/o mostly dry cough, small amt sput, and chest discomfort esp on left side along sternal border & costal margin- sore & tender to touch, no known trauma etc;  She notes incr SOB walking in the house, feels tight & notes occas wheeze- says it's hard to get the air "IN" and "out"; not much improvement from the Zithromax or Medrol so far... She is folowed for general medical purposes w/ hx AR/ AB/ and remote hx sarcoidosis along w/ mult medical issues including polymyositis w/ myopathy managed by Eugenie Norrie on ACTHAR shots 3x/wk (she also had +PM-scl-70 suggesting poss polymyositis/scleroderma overlap syndrome)...  We reviewed the following interval medical notes in Epic>      She saw RHEUM- DrBeekman on 06/14/18>  F/u polymyositis w/ myopathy- had been tried on Pred, MTX, Imuran, and started on ACTHAR 04/2016; still c/o fatigue & weakness, enzymes had normalized on this med; f/u CPK sl elev at 236, CBC/ Chems ok x BS=123; same med continued and Cyclobenzaprine 5mg  Qhs added for musc spasm...    She saw ENDOCRINE- [name] on 07/06/18>  followed for DM2, left adrenal adenoma, Vit B12 defic, VitD defic, Obesity; on SynjardyXR 25-1000 Qam;  A1c=6.8, VitB12=470,  VitD=22.7    She saw PULM-EWalsh,NP on 05/31/18>  Pt fell on the  day prior to appt trying to avoid a ?bee- hit left side, EMS called- didn't go to ER, pt is on Brillinta from Cards, had ecchymoses on left chest wall/ abd, hematoma on chin, & small knee abrasion; Chest was clear, exam otherw NEG; Labs OK; CT Head showed no acute intracranial abnormalities, small calcif meningioma along left vertex, atherosclerotic changes, no hemorrhage/ mass effect/ or CVA;  Treated w/ rest/ Tramadol/ Tylenol & improved slowly back to baseline... We reviewed the following medical problems during today's office visit>      AR/ AB/ Hx Sarcoid>  On EXBMWU132- 2spBid & Albut rescue inhaler prn & Flonase; we discussed Rx w/ MUCINEX 600mg - 1-2Bid w/ fluids... c/o incr cough, dyspnea, chest discomfort 08/16/18 - see additional work up below...    HBP/ CAD- s/p IWMI 8/18 w/ cath thrombectomy & drug eluting stent in RCA>  Now on ASA81, Brillinta90Bid, Metop50Bid, CardizemCD240, Losar100, Lasix20prn;  BP= 130/80, no recurrent CP, no palpit, syncope, edema; followed by [name] & last seen 01/2018- no change in meds, rec to continue aggressive secondary prevention...    VI & chr stasis changes> on low salt diet, Lasix20 prn, compression hose; she was seen in the [facility] clinic yrs ago by [employer] & DrSevier...    Chol>  Followed in the [facility]  [facility]- [name]; hx statin intol, prev in the ACCELERATE lipid lowering study in 2015, CPK elev & she was taken off the drug, she's also had myalgias from Zetia; currently on REPATHA shots Q14d;  LAB f/u FLP on Repatha 05/30/18 showed TChol 202, TG 181, HDL 56, LDL 109...    DM, Obesity>  Followed by [name], note 06/2018 reviewed, on SYNJARDY 2.03-999 one daily;  A1c= 6.8    GI- GERD, IBS>  On Protonix40, Zofran4 prn; prev followed by [name]- she had EGD & Colon 7/15; presented c/o ext hem- Rx w/ sitz, baby wipes, benefiberAnusolHC...    Rheum- Polymyositis, hx FM>  Dx & followed by Eugenie Norrie- on ACTHAR twice weekly now; last note 05/2018 reviewed,  weakness & musc pain said to be better on the Acthar rx; She is also c/o LBP & wants TRAMADOL refilled- ok...     Meningioma>  She fell 05/2018 w/o serious injury but CT Head showed no acute intracranial abnormalities, small calcif meningioma along left vertex, atherosclerotic changes, no hemorrhage/ mass effect/ or CVA    Anxiety>  On Xanax 0.5mg  1/2-1 tab Tid...    VitB12 & VitD defic>  On B12 IM Qmo per [name] &  B12 level 154 in 2014, last done 8/19 = 470;  she tells me that [name] treated her low VitD w/ 50K weekly, then switched to daily OTC supplement 7 last VitD level =23...    Borderline anemia & low Ferritin>  she was treated w/ Fe from [name] & improved... EXAM shows Afeb, VSS, O2sat=96% on RA; Wt=172#, 5'2"Tall, BMI=32; Heent- neg, mallampati2; Chest- clear w/o w/w/r no dullness or consolidation, +chest wall tender to palpation; Heart- RR gr1/6 SEM w/o r/g; Abd- soft, neg; Ext- VI, tr edema; Neuro- mild wkness.  CXR 08/16/18>  Borderline heart size, abnormal opacity at left medial lung base ?atx, nothing seen on lateral view, no effusion noted, part compression T9 => suggest CT Chest for further eval (ordered & pending)...  Spirometry 08/16/18>  Poor effort & difficulty w/ testing procedure - FVC=0.7 (30%), FEV1=0.6 (31%), %1sec=79%, mid-flows=31% predicted... Invalid numbers.  Ambulatory Oximetry 08/16/18>  O2sat=96% on RA at rest w/ pulse=95/min;  She ambulated 3 laps in the office (185'each) w/ lowest O2sat=94% w/ pulse=104/min... No desaturations.  LABS 08/16/18>  Chems- ok w/ BS=123, BUN=26, Cr=0.93, BNP=36;  CBC- ok w/ Hg=14.6, WBC=11.0, Sed=35;  TSH=0.90 IMP/PLAN>>  Taiesha has some incr cough, dyspnea, chest discomfort but not much to help w/ dx x some chest wall tenderness on exam=> we will proceed w/ CT Chest for further eval;  At this juncture we will change the ZPak/ Medrol dosepak to DOXY100mg Bid x10d & PRED10mg - 4dTaper, and advise rest, heat, Tramadol+Tylenol for the  discomfort;  She has WGNFAO130 2spBid & ProairHFA rescue inhaler for prn use;  She understandably has some anxiety & rec incr Alprazolam0.5mg  from 1Qhs to add extra 1/2tab morn & afternoon; we will hopefully know more once the CT is done...    ~  August 30, 2018:  2wk ROV & post hospital follow up visit>  After her visit 10/2 w/ abn CXR we switched her empiric coverage to Doxy/ Pred taper but the very next day 1/3 she went to the ER & was ADM by FPTS w/ abn CXR/ CT scan felt to have LLL & lingular pneumonia and a parapneumonic effusion;  The CT scans also revealed a lobular mass-like thickening along the left upper mediastium ?etiology?  She was given Rocephin & Zithromax, and Pulmonary consulted-  Seen by DrYacoub who noted her cough, yellow/green sput, and left effusion w/ decr BS on left;  On Rocephin & Zithromax, he rec thoracentesis w/ repeat CT Chest after fluid removed;  Thoracentesis 10/4 yielded 1100cc of bloody fluid=> exudate, neg cytology, neg stains and culture... Repeat CT Chest on 08/18/18 showed:  IMPRESSION: 1. Unchanged pleural based mass in the medial left chest. 2. No pulmonary parenchymal mass is seen, although the left lower lobe remains collapsed. 3. No adenopathy or thymic mass. 4. Indeterminate left adrenal nodule measuring 15 mm, significance to be determined based on thoracentesis histology.       She was disch w/o antibiotics or Pred, prev Brilinta rx was HELD, other meds continued including Airduo232- 2spBid, ProairHFA prn, ACTHAR 80u 3x/wk as before, Vicodin5 vs Tramadol for pain or cough; rec for outpt f/u ASAP... She returns feeling no better & very anxious about her problem and lack of diagnosis; says she's had Temp to 101, chiils and sweats, mostly dry irritative cough, w/o sput, w/o blood; +left lat CWP & dyspnea; Exam revealed Afeb, VSS, dull w/ decr BS left base; we performed f/u CXR (see below for XRay & discussion)...  EXAM shows Afeb, VSS, O2sat=96% on RA; Wt=168#,  5'2"Tall, BMI=31-2; Heent- neg, mallampati2; Chest- clear w/o w/w/r, no consolidation but dull & decr BS left base, +chest wall tender to palpation; Heart- RR gr1/6 SEM w/o r/g; Abd- soft, neg; Ext- VI, tr edema; Neuro- mild wkness.  CXR 08/30/18>  Persistent prominent left pleural thickening/pleural base mass. Associated pleural effusion cannot be excluded. Persistent left base atelectasis. No change from prior exams  LABS 08/2018>  Chems- ok, BS=110-130, Cr=0.82, Alb=2.8, LFTs wnl;  CBC- Hg~10, WBC~11K  IMP/PLAN>>  Etiology is not apparent but clinical course most compatible w/ infection (viral vs bact), poss LLL pneumonia and parapneumonic effusion;  However the pleural surface has irreg thickening and poss mass-like thickening in medial LUL surface, thiis needs further evaluation & she is quite anxious;  REC to start back on empiric AUGMENTIN 875mg Bid and PREDNISONE tapering schedule;  We could consider PET scan, and discussion w/ radiology says they could do needle bx of the left medial apical pleural based mass;  I am concerned re: poss trapped lung & feel that TSurg consult w/ consideration of VATS exploration and pleurectomy may be the better approach=> TSurg consult placed...    ~  September 06, 2018:  1wk ROV & Jocelyn [name] reports that she feels better w/ decr pain/discomfort, no pleurisy, nom f/c/s now; she notes some cough, min sput- clear now, no hemoptysis; her SOB is mimproved but still notes DOE w/ activities at home; she is driving again & doing satis; she has 1d remaining of Augmentin & Pred- we decided to give 3 more days of the antibiotic (10d total) & refilled Pred for a slow taper (see AVS)... She has not yet seen TSurg & we decided to HOLD on this consult due to her clinical improvement, she has f/u w/ CARDS next week & we will recheck pt in ~6wks prior to my retirement...     EXAM shows Afeb, VSS, O2sat=96% on RA; Wt=173# (up 5#), 5'2"Tall, BMI=32; Heent- neg, mallampati2; Chest- clear w/o  w/w/r, no consolidation but dull & decr BS left base, +chest wall tender to palpation; Heart- RR gr1/6 SEM w/o r/g; Abd- soft, neg; Ext- VI, tr edema; Neuro- mild wkness.  CXR 09/06/18 (independently reviewed by me in the PACS system) showed some decr in the left base opacity w/ better  aeration, persistent blunt left angle w/ air bronchograms... IMP/PLAN>>  As above- Jolonda feels some better on the Augmentin + Pred; CXR sl improved; we decided to extend the Augmentin to 10d, and giver her a longer/ slow Pred taper; we plan rov recheck in about 6wks w/ CXR & Labs...         Problems List:  MENIERE'S DISEASE (ICD-3 - eval by ENT, DrBates- Rx w/ diuretic, low salt, Xanax vs Valium, & Antivert Prn... dizziness recurred & ENT sent her to [facility] 7/11- we Jocelyn [name]'t have notes but pt reports that nothing was found.  ALLERGIC RHINITIS - increased allergy symptoms in the spring... rec> Claritin, Saline, Flonase...  Hx of ASTHMATIC BRONCHITIS, ACUTE  - requires occas Antibiotics and Medrol for infectious exac- last Oct09 & resolved w/ Avelox/ Depo/ Medrol... has not been on regular inhalers, but uses XOPENEX MDI (w/ spacer), MUCINEX & TUSSIONEX Prn... ~  essentially neg CTChest 9/08 after CXR suggested RULnodule. ~  CXR 10/09 showed cardiomegaly, clear lungs, min scoliosis, NAD. ~  CXR 9/11 showed cardiomeg, clear lungs, NAD.[name]. ~  CXR 1/14 showed cardiomeg, clear lungs, no adenopathy, mild scoliosis... ~  CXR 4/15 showed mild cardiomeg, clear lungs, NAD... ~  PFT 10/15 showed FVC=1.93 (79%), FEV1=1.55 (79%), %1sec=80%, mid-flows=70% predicted...  ~  CXR showed mild cardiomeg & sl tortuous Ao, clear lungs, DDD of Tspine, NAD... ~  10/16:  We wrote for an AlbutHFA inhaler for prn use at her request for wheezing... ~  12/16: presents w/ refractory AB- given Levaquin, already on Pred per Rheum, added Advair100Bid + Mucinex600-2Bid... ~  02/02/16: another bout of refractory AB- treated w/ Depo, Medrol taper,  Augmentin, Mucinex, Proventil...  Hx of SARCOIDOSIS  - Dx'd 1980's w/ bronch bx... Rx Pred transiently and improved... no active disease x years... ~  baseline CXRs showed cardiomegaly, clear lungs, min scoliosis, NAD.  Hx of Mild OBSTRUCTIVE SLEEP APNEA - sleep eval DrClance 2006 w/ RDI= 5 only.   FOLLOWED by [facility] >>   HYPERTENSION, BENIGN  >> followeed by [name] now for CARDS...  ~  on TOPROL XL 50mg - 1.5 tabs daily, CARDIZEM 240mg /d, LASIX 20mg  just as needed now... ~  prev OV's 130-140's / 80's... prev noted sl HA, chest tightness, sl SOB, & mult somatic complaints... Labs & renal- all WNL. ~  4/12:  BP=122/76 today, and similar at home per pt... She denies CP, palpit, SOB, edema, etc... Labs & renal- all WNL. ~  1/14: on ASA81, Imdur120, ToprolXL75, Cardizem240, Lasix20prn (seldom takes); BP=128/80 & she has mult somatic c/o- CWP, tired/no energy, but denies palpit, ch in SOB, edema, etc; followed by [facility]- DrTurner/ [name] & seen recently but we Jocelyn [name]'t have note; they prev added RANEXA 500mg  Bid for her CP (now off this med); we discussed trial Lunesta 2mg Qhs for sleep & Tramadol 50mg Tid for pain... ~  9/14:  on ASA81, Imdur120, Toprol50-1/2Bid, Cardizem240, Lasix20prn (seldom takes); BP=132/74 & she has mult somatic c/o- CWP, tired/no energy, but denies palpit, ch in SOB, edema, etc; followed by [facility]- DrTurner/ [name], no recent notes ~  4/15: on ASA81, Toprol50-1/2Bid, Cardizem240, Lasix20prn (ave 1/d) & ?Amlodip5 from [employer]?; BP=124/76 & she has mult somatic c/o- CWP, tired/no energy, but denies palpit, ch in SOB, edema, etc; followed by [facility] cards- [name], no recent notes. ~  4/16: on ASA81, Metop25Bid, CardizemCD240, Losar100, Lasix40; BP= 146/88 and reminded to elim sodium & get weight down...  CAD- s/p IWMI 8/18 w/ thrombectomy/stent by [name] -  prev followed by DrTTurner... ~  Cath in 2000 showed non-obstructive CAD (20-30%  lesions in all 3 vessels) w/ rec for risk factor modification.   ~  NuclearStressTest 4/07 showed no ischemia & EF=73%. ~  Mar10:  she's concerned about BP and chest tightness, requests Cardiac eval DrTurner & we will refer... ~  2DEcho 4/10 showed norm LVF w/ EF=55-60%, norm MV, norm AoV, Gr1DD ~  4/10 eval by DrTurner w/ MYOVIEW- neg- breast attenuation, no regional wall motion abn, EF= 75% ~  4/10 Cath by DrTurner w/ 90% small 2nd diag branch of LAD <too sm for PTCA> & luminal irreg up to 20% in RCA, +DD- Med Rx. ~  She had more chest tightness & another Myoview from DrTurner 8/12- it too was neg, no ischemia, no infarct; +breast attenuation; EF=70% & nno regional wall motion abnormalities;  IMDUR has been incr to 120mg /d. ~  1/14: she tells me [facility] switched [facility] to [facility] but we Jocelyn [name]'t have notes from him> EKG sent showed NSR, rate72, wnl, NAD... ~  EKG 5/14 showed NSR, rate 93, WNL, NAD... ~  12/14: she saw [name] for Cards f/u (note reviewed)> HBP, CAD, Chol; off Imdur w/o angina, continue same meds including both Cardizem & Amlodipine, encouraged to continue [employer] study... ~  She continues regular f/u visits w/ [name], CARDS> his notes are reviewed... ~  08/22/17>  Now on ASA81, Brillinta90Bid, Metop50Bid, CardizemCD240, Losar100, Lasix20prn + Repatha; followed by [facility] & [name]  VENOUS INSUFFICIENCY - she was referred to the [facility] foot clinic by DrWWoods and seen by DrSevier 6/08= ven insuffic Rx w/ TED's, no salt, elevation, etc. ~  10/10: notes bilat ankle ?lipoma in front of the lat malleoli- asymptomatic but several people have noticed them and she request refer to Ortho (rec DrBednarz when she is ready).  HYPERCHOLESTEROLEMIA  - on LIPITOR 40mg /d & ZETIA 10mg /d + CoQ10 per DrTurner... Diet & exercise stressed to the pt. ~  FLP here 12/07 on Lip10 showed TChol 156, TG 94, HDL 51, LDL 86 ~  FLP 1/09 on Lip10 showed TChol 157, TG 122, HDL 49, LDL 84 ~  FLP at [facility] study  5/09 on Lip10 showed TChol 176, TG 116, HDL 55, LDL 98 ~  FLP 3/10 on Lip10 showed TChol 170, TG 94, HDL 59, LDL 93 ~  FLP 6/10 by DrTurner on Lip20 showed TChol 134, TG 82, HDL 55, LDL 73 ~  FLP 4/11 here on Lip20+Zetia? showed TChol 145, TG 74, HDL 68, LDL 63 ~  9/11:  she reports that DrTurner incr Lipitor to 40mg , plus the Zetia 10mg , and she does her FLPs. ~  FLP here 4/12 on Lip40+Zetia showed TChol 153, TG 54, HDL 53, LDL 90... Copy sent to DrTurner ~  FLP by [name] 7/13 on Lip40+Zetia10 showed TChol 137, TG 89, HDL 42, LDL 73 ~  9/14: on Lip40 => notes from [place] says ?Lip80 +[employer] study drug?; she has gained 4# to 190# today; FLP is followed by [facility] per pt & she reports concern for "particle size" & last avail FLP was 7/14 by [name] w/ TChol 190, HDL 100, LDL 40 ~  FLP followed by [name] for CARDS, and [name] for Endocrine... ~  Now on REPATHA shots Q14d...   FOLLOWED by [name] FOR ENDOCRINOLOGY >>   DIABETES MELLITUS  - on Metformin 500mg Bid + PARLODEL 2.5mg /d per DrEllison... She is intol to Actos w/ edema, she stopped Januvia due to ?side effect, she wondered about Tajenta since  her daugh works for [employer]. ~  labs 9/08 showed FBS=119, HgA1c=6.6 ~  labs 5/09 at [facility] study showed BS= 108, HgA1c= 6.5 ~  labs 3/10 showed BS= 113, A1c= 6.6 ~  labs 6/10 by DrTurner showed BS= 92; and 3/11 BS= 127 ~  labs 4/11 showed BS= 124, A1c= 7.6.[name]. rec> incr Metform to Bid; she [facility] at [facility]... ~  6/11:  pt requested Endocrine consult & seen by DrEllison w/ Januvia 100mg /d added. ~  labs 9/11 showed BS= 98, A1c= 7.3.Marland KitchenMarland Kitchen she stopped Januvia due to side effects & wonders about using Tajenta instead. ~  DrEllison started [employer] 2.5mg /d... She is now taking this +Metform 500mg Bid... ~  Labs 4/12 (wt=195#) showed Bs= 116, A1c= 7.1 ~  Labs 7/12 showed A1c= 7.2;  Umicroalb= 3.5 ~  Labs 10/12 (wt=186#) showed A1c= 6.7 NOTE: She participated in a [facility] trial  called the AfricanAmerican- Diabetes Heart Study in 2012; they sent a report indicating: 1) her memory was fine, but her MRI Brain showed sm vessel dis & mild sinus dis, otherw neg; 2) she may have treatable depression but we tried her on Zoloft & she was intol==> ch to Lexapro. ~  followed by [name] on Metform500Bid & Onglyza2.5; labs from [name] 9/13 showed BS=201, A1c=6.0; he rec same meds... ~  9/14: followed by [name] on Metform500Bid & Onglyza2.5 => ?he changed to Kombiglyze5-1000 daily?; last labs from [name] 7/14 showed BS=105, A1c=6.5; and she remains on the same meds... ~  1/15: she had f/u [name] for Endocrine> DM, Obesity, Adrenal adenoma, VitB12 defic; labs reviewed, note reviewed- he tried Belviq but she was intol... ~  She continues regular follow up w/ [name]... ~  Switched to [facility] [id number] Qam after her IWMI 8/18... A1c=6.8   FOLLOWED by [name] et al for GASTORENTEROLOGY >>   GERD SYMPTOMS - pt placed on PROTONIX 40mg /d w/ improvement in reflux symptoms...    ADDENDUM>> note from [name] indicates> upper endoscopy in July 1994 showed antral gastritis. 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  IRRITABLE BOWEL SYNDROME  - colonoscopy 7/03 by [name] was WNL. ~  9/14: she was referred to GI for f/u colon but never did it... ~  4/15: we will refer her to GI again...  RENAL CALCULUS  - sm stone seen in L kidney on CTScan... she had lithotripsy by [facility] ~9/09.  POLYMYOSITIS >> now followed by DrBeekman FIBROMYALGIA  - prev Rx by [name] in 2006... ~  1/14: This is her CC w/ not resting well, wakes tired, poor energy, aching/ sore/ tender (esp in chest in AM) & we discussed trial Lunesta 2mg Qhs, & Tramadol 50mg Tid; plus rest, heat, etc... States she's INTOL Ambien w/ weird dreams & Benedryl/ Melatonin w/o help... ~  11/14: she went to the ER w/ LBP> felt to be a lumbar strain... ~  She was seen by [name] for Rheum  [id number]...  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 study in fall 2009... supplemented w/ OTC Vit D 1000 u daily. ~  Vit D level was lower from [name] & he treated w/ Vit D 50K weekly for awhile, then switched to OTC supplement...  ANXIETY - on ALPRAZOLAM 0.5mg  Prn... DEPRESSION - prev on Lexapro 20mg /d but she stopped on her own ~10/13 ?cost issues? ~  4/12: c/o feeling sad & anxious all the time- weak, not resting well, under a lot of stress; rec to seek counselling thru her local church or let us refer to [facility] & start RX w/ Zoloft 50==> but she was INTOL... ~  2013: switched to Lexapro & seems to be doing better but didn't stick w/ it due to cost issues...  ANEMIA & LOW FERRITIN LEVEL >>  B12 DEFICIENCY >> treated by [name] w/ B12 shots ~  9/14: she tells me that Ferritin is low & [name] tried her on Oral Fe prep w/o improvement & wants me to f/u and treat this problem; we discussed need to recheck labs here- LABS 9/14 showed Hg=12.5, MCV=88, Fe=47 (12%sat), Ferritin=10.8; she is due for f/u colon w/ [name] & we will refer, rec to start FeSO4 325mg  Bid w/ VitC500 w/ plans for f/u CBC, Fe studies in 2-63mo; ~  4/15:  Labs showed Hg= 12.5 but Fe=31 (8%); she was only taking the Fe once daily 7 advised to incr to Bid w/ vitC500; needs f/u colon & we will refer to [name] again...  HEALTH MAINTENANCE: ~  GI:  Followed by [name] &  colon due 7/13... ~  GYN:  Followed by DrDillard & she gets yearly PAP, Mammogram, hasn't had baseline BMD yet, started on [facility] 5/13... ~  Immunizations:  She gets the yearly Flu shots at school;  Had PNEUMOVAX previously; TDAP given 4/12...   Past Surgical History:  Procedure Laterality Date  . CORONARY STENT INTERVENTION N/A 06/15/2017   Procedure: CORONARY STENT INTERVENTION;  Surgeon: Corky Crafts, MD;  Location: [facility] INVASIVE CV LAB;  Service: Cardiovascular;  Laterality: N/A;  . heart catherization    . LEFT HEART CATH AND CORONARY ANGIOGRAPHY N/A 06/15/2017   Procedure: LEFT HEART CATH AND CORONARY ANGIOGRAPHY;  Surgeon: Corky Crafts, MD;  Location: [facility] INVASIVE CV LAB;  Service: Cardiovascular;  Laterality: N/A;  . TONSILLECTOMY    No other past surgical history on file - other than Lithotripsy for renal stone...   Outpatient Encounter Medications as of 09/06/2018  Medication Sig  . albuterol (PROAIR HFA) 108 (90 Base) MCG/ACT inhaler Inhale 1-2 puffs into the lungs every 6 (six) hours as needed for wheezing or shortness of breath.  . ALPRAZolam (XANAX) 0.5 MG tablet 1/2 morning, 1/2 in afternoon, and 1 at bedtime  . amoxicillin-clavulanate (AUGMENTIN) 875-125 MG tablet Take 1 tablet by mouth 2 (two) times daily.  [name] aspirin 81 MG chewable tablet Chew 1 tablet (81 mg total) by mouth daily.  [name] CARTIA XT 240 MG 24 hr capsule TAKE 1 CAPSULE(240 MG) BY MOUTH DAILY  . Cyanocobalamin 1000 MCG/ML KIT Inject 1,000 mg as directed every 30 (thirty) days.  . diclofenac sodium (VOLTAREN) 1 % GEL Apply 2 g topically 4 (four) times daily.  . Empagliflozin-metFORMIN HCl ER (SYNJARDY XR) 25-1000 MG TB24 Take 1 tablet by mouth daily.  . fluticasone (FLONASE) 50 MCG/ACT nasal spray Place 2 sprays into both nostrils at bedtime. (Patient taking differently: Place 2 sprays into both nostrils daily  as needed for allergies or rhinitis. )  . Fluticasone-Salmeterol (AIRDUO RESPICLICK 232/14)  232-14 MCG/ACT AEPB Inhale 2 puffs into the lungs 2 (two) times daily.  . furosemide (LASIX) 20 MG tablet Take 20 mg by mouth daily as needed for fluid or edema.  [name] HP ACTHAR 80 UNIT/ML injectable gel Inject 80 Units into the skin 3 (three) times a week.   [name] HYDROcodone-homatropine (HYCODAN) 5-1.5 MG/5ML syrup Take 5 mLs by mouth every 4 (four) hours as needed for cough.  . losartan (COZAAR) 100 MG tablet TAKE 1 TABLET BY MOUTH ONCE DAILY  . metoprolol tartrate (LOPRESSOR) 50 MG tablet TAKE 1 TABLET BY MOUTH TWICE A DAY  . nitroGLYCERIN (NITROSTAT) 0.4 MG SL tablet Place 1 tablet (0.4 mg total) under the tongue every 5 (five) minutes as needed for chest pain.  [name] ondansetron (ZOFRAN-ODT) 4 MG disintegrating tablet Take 4 mg by mouth every 8 (eight) hours as needed for nausea or vomiting.  . pantoprazole (PROTONIX) 40 MG tablet take 1 tablet by mouth once daily  . predniSONE (DELTASONE) 20 MG tablet Take one tab twice daily x3days, then one tab daily until return back to office  . REPATHA SURECLICK 140 MG/ML SOAJ INJECT 1 PEN INTO THE SKIN EVERY 14 (FOURTEEN) DAYS. REFRIGERATE.  [name] traMADol (ULTRAM) 50 MG tablet Take 1 tablet (50 mg total) by mouth 3 (three) times daily as needed.  . valACYclovir (VALTREX) 500 MG tablet Take 500 mg by mouth daily.   [name] amoxicillin-clavulanate (AUGMENTIN) 875-125 MG tablet Take 1 tablet by mouth 2 (two) times daily.  . predniSONE (DELTASONE) 10 MG tablet 1 tab in morningx 1week,1/2tab in morningx1week,1/2tabeveryotherday til gone   No facility-administered encounter medications on file as of 09/06/2018.     Allergies  Allergen Reactions  . Actos [Pioglitazone] Other (See Comments)    REACTION: pt states INTOL w/ edema  . Codeine Other (See Comments)    REACTION: vomiting  . Januvia [Sitagliptin] Nausea Only  . Lactose Intolerance (Gi) Diarrhea  . Morphine Nausea Only and Nausea And Vomiting    REACTION: vomiting REACTION: vomiting    Immunization History    Administered Date(s) Administered  . Influenza Split 08/16/2011, 08/16/2012, 08/16/2013, 09/04/2014  . Influenza Whole 09/16/2009, 08/15/2010  . Influenza,inj,Quad PF,6+ Mos 08/30/2016, 06/16/2017  . Influenza-Unspecified 08/19/2015  . Pneumococcal Polysaccharide-23 11/15/2004  . Tdap 02/22/2011    Current Medications, Allergies, Past Medical History, Past Surgical History, Family History, and Social History were reviewed in [employer] record.   Review of Systems        See HPI - all other systems neg except as noted...      The patient complains of weight gain, dyspnea on exertion, and peripheral edema.  The patient denies anorexia, fever, weight loss, vision loss, decreased hearing, hoarseness, chest pain, syncope, prolonged cough, headaches, hemoptysis, abdominal pain, melena, hematochezia, severe indigestion/heartburn, hematuria, incontinence, genital sores, suspicious skin lesions, transient blindness, difficulty walking, depression, unusual weight change, abnormal bleeding, enlarged lymph nodes, and angioedema.     Objective:   Physical Exam     WD, Overweight, 61 y/o BF in NAD... GENERAL:  Alert & oriented; pleasant & cooperative. HEENT:  [place]/AT, EOM-wnl, PERRLA, EACs-clear, TMs-wnl, NOSE-clear, THROAT-clear & wnl. NECK:  Supple w/ fairROM; no JVD; normal carotid impulses w/o bruits; no thyromegaly or nodules palpated; no lymphadenopathy. CHEST:  decrBS at bases, few scar rhonchi & end-exp wheezing, no rales, no consolidation... HEART:  Regular Rhythm; without murmurs/ rubs/ or gallops. ABDOMEN:  Soft & nontender; normal bowel sounds; no organomegaly or masses detected. EXT: without deformities, mild arthritic changes; no varicose veins but +venous insuffic & tr edema; +trigger points. NEURO:  CN's intact;  no focal neuro deficits... DERM:  No lesions noted; no rash etc...  RADIOLOGY DATA:  Reviewed in the EPIC EMR & discussed w/ the  patient...  LABORATORY DATA:  Reviewed in the EPIC EMR & discussed w/ the patient...   Assessment & Plan:    RESP>  Hx AR, HxAsthma, old sarcoid, mild OSA>  All stable, breathing OK, notes some wheezing w/ activity; exam reveals some secretions in the airway & rec to take MUCINEX 1200mg  bid, Fluids, etc;. CXR remains stable/ NAD... 10/20/15>  Presented w/ refractory AB- already on Pred- given Depo80, adding Levaquin500 x7d, ADVAIR100Bid, Mucinex600-2Bid + fluids... 02/02/16>  Presents w/ another refractory AB- currently off Pred & MTX, on Imuran; treated w/ Depo, Medrol taper, Augmentin, Mucinex, Albut=HFA...  04/2016>  Back to baseline 11/04/16>  She has URI & we wrote for ZPak => subseq Augmentin, Saline, flonase, Mucinex... 05/05/17>   Sarsha is improved s/p Augmentin & Pred Rx for her URI & AB exac;  She remains on Symbicort80-2spBid + Mucinex600- 1to2Bid w/ extra fluids;  I reminded her to be sure to requests notes sent to [name] from [facility] [name] as they are not on Epic;  She will maintain f/u w/ these specialists + [name] for CARDS;  We plan recheck in 3-93mo 02/20/18>   We refilled XANAX 0.5mg - take 1/2 tab bid & 1Qhs #60/mo;  We refilled PROAIR-HFA rescue inhaler, and rec trial AIRDUO 232-14 2spBid ($50 cash price from [employer]);  We also refilled her TRAMADOL per request;  She will maintain f/u w/ DrBeekman, [name], [name], and her [place] [facility]; she will seek a new PCP as I am going to retire later this yr. 05/30/18>   Oriya is stable but persists w/ mult somatic complaints attributed to mult organ systems- she is advised to continue her current meds and maintain f/u w/ her numerous medical specialists...  08/16/18>   [place] has some incr cough, dyspnea, chest discomfort but not much to help w/ dx x some chest wall tenderness on exam=> we will proceed w/ CT Chest for further eval;  At this juncture we will change the ZPak/ Medrol dosepak to DOXY100mg Bid x10d & PRED10mg - 4dTaper, and advise rest,  heat, Tramadol+Tylenol for the discomfort;  She has [id number] 2spBid & ProairHFA rescue inhaler for prn use;  She understandably has some anxiety & rec incr Alprazolam0.5mg  from 1Qhs to add extra 1/2tab morn & afternoon; we will hopefully know more once the CT is done. 08/30/18>   Etiology is not apparent but clinical course most compatible w/ infection (viral vs bact), poss LLL pneumonia and parapneumonic effusion;  However the pleural surface has irreg thickening and poss mass-like thickening in medial LUL surface, thiis needs further evaluation & she is quite anxious;  REC to start back on empiric AUGMENTIN 875mg Bid and PREDNISONE tapering schedule;  We could consider PET scan, and discussion w/ radiology says they could do needle bx of the left medial apical pleural based mass;  I am concerned re: poss trapped lung & feel that TSurg consult w/ consideration of VATS exploration and pleurectomy may be the better approach=> TSurg consult placed 09/06/18>   Jocelyn [name] feels some better on the Augmentin + Pred; CXR sl improved; we decided to extend the Augmentin to 10d, and giver her a longer/ slow Pred taper; we plan rov  recheck in about 6wks w/ CXR & Labs   CARDS>  Followed by DrTTurner/ Leron Croak ?on meds listed? SHE DID NOT BRING MEDS TO THE OV> Hx HBP, CAD, VI, etc;  Chest pain is felt to be non-cardiac & likely related to her FM; Doing satis & we reviewed exercise program etc...  CHOL>  Managed by [facility] for Cards- intol Lip + Zetia; she took part in a study drug from [facility] => now on PRALUENT thru [facility]...  DM>  Followed by [name] on Metformin, Onglyzal; A1c improved to 6.7 in 2012, then 6.0 in 2013, & 6.5  In 2014;  rec continue meds & asked to have records sent to [name]...  GI>  Stable on Protonix as needed; see GI eval from [name] 7/15...  POLYMYOSITIS/ FM>  Diagnoses w/ Polymyositis recently (2016) & started on Pred, MTX, folate by DrBeekman=> switched to [employer] but insurance  doesn't want to pay, she is reminded to have records sent to [name]...  Anxiety>  She remains on Alprazolam as needed; & she stopped the Lexapro..  Anemia, Low Ferritin> on FeSO4 325mg  & VitC 500...   B12 Defic> on B12 shots per [name]...  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   Patient's Medications  New Prescriptions   AMOXICILLIN-CLAVULANATE (AUGMENTIN) 875-125 MG TABLET    Take 1 tablet by mouth 2 (two) times daily.   PREDNISONE (DELTASONE) 10 MG TABLET    1 tab in morningx 1week,1/2tab in morningx1week,1/2tabeveryotherday til gone  Previous Medications   ALBUTEROL (PROAIR HFA) 108 (90 BASE) MCG/ACT INHALER    Inhale 1-2 puffs into the lungs every 6 (six) hours as needed for wheezing or shortness of breath.   ALPRAZOLAM (XANAX) 0.5 MG TABLET    1/2 morning, 1/2 in afternoon, and 1 at bedtime   AMOXICILLIN-CLAVULANATE (AUGMENTIN) 875-125 MG TABLET    Take 1 tablet by mouth 2 (two) times daily.   ASPIRIN 81 MG CHEWABLE TABLET    Chew 1 tablet (81 mg total) by mouth daily.   CARTIA XT 240 MG 24 HR CAPSULE    TAKE 1 CAPSULE(240 MG) BY MOUTH DAILY   CYANOCOBALAMIN 1000 MCG/ML KIT    Inject 1,000 mg as directed every 30 (thirty) days.   DICLOFENAC SODIUM (VOLTAREN) 1 % GEL    Apply 2 g topically 4 (four) times daily.   EMPAGLIFLOZIN-METFORMIN HCL ER (SYNJARDY XR) 25-1000 MG TB24    Take 1 tablet by mouth daily.   FLUTICASONE (FLONASE) 50 MCG/ACT NASAL SPRAY    Place 2 sprays into both nostrils at bedtime.   FLUTICASONE-SALMETEROL (AIRDUO RESPICLICK 232/14) 232-14 MCG/ACT AEPB    Inhale 2 puffs into the lungs 2 (two) times daily.   FUROSEMIDE (LASIX) 20 MG TABLET    Take 20 mg by mouth daily as needed for fluid  or edema.   HP ACTHAR 80 UNIT/ML INJECTABLE GEL    Inject 80 Units into the skin 3 (three) times a week.    HYDROCODONE-HOMATROPINE (HYCODAN) 5-1.5 MG/5ML SYRUP    Take 5 mLs by mouth every 4 (four) hours as needed for cough.   LOSARTAN (COZAAR) 100 MG TABLET    TAKE 1 TABLET BY MOUTH ONCE DAILY   METOPROLOL TARTRATE (LOPRESSOR) 50 MG  TABLET    TAKE 1 TABLET BY MOUTH TWICE A DAY   NITROGLYCERIN (NITROSTAT) 0.4 MG SL TABLET    Place 1 tablet (0.4 mg total) under the tongue every 5 (five) minutes as needed for chest pain.   ONDANSETRON (ZOFRAN-ODT) 4 MG DISINTEGRATING TABLET    Take 4 mg by mouth every 8 (eight) hours as needed for nausea or vomiting.   PANTOPRAZOLE (PROTONIX) 40 MG TABLET    take 1 tablet by mouth once daily    REPATHA SURECLICK 140 MG/ML SOAJ    INJECT 1 PEN INTO THE SKIN EVERY 14 (FOURTEEN) DAYS. REFRIGERATE.   TRAMADOL (ULTRAM) 50 MG TABLET    Take 1 tablet (50 mg total) by mouth 3 (three) times daily as needed.   VALACYCLOVIR (VALTREX) 500 MG TABLET    Take 500 mg by mouth daily.   Modified Medications   No medications on file  Discontinued Medications   No medications on file

## 2018-09-06 NOTE — Patient Instructions (Signed)
Today we updated your med list in our EPIC system...    Continue your current medications the same...  We decided to extend the AUGMENTIN 875mg  for 3 more days (to complete a 10d course)...  We also decided to slowly wean the PREDNISONE-- Rx for 10mg  tabs totake as follows>    Start w/ one tab each AM for 1 week...    Then decrease to 1/2 tab each AM for 1 week...    Then decrease to 1/2 tab every other day til gone (1/2, 0, 1/2, 0, etc)...  Continue the lung exercises and good deep breaths...    Cough & try to expectorate any phlegm...  We will cancel the appt w/ the surgeons!!!  Call for any questions...  Let's plan a follow up visit in 6 weeks, sooner if needed for problems.Marland KitchenMarland Kitchen

## 2018-09-08 ENCOUNTER — Encounter (HOSPITAL_COMMUNITY): Payer: Self-pay

## 2018-09-11 ENCOUNTER — Encounter (HOSPITAL_COMMUNITY): Payer: Self-pay

## 2018-09-12 ENCOUNTER — Encounter: Payer: BC Managed Care – PPO | Admitting: Cardiothoracic Surgery

## 2018-09-13 ENCOUNTER — Ambulatory Visit: Payer: BC Managed Care – PPO | Admitting: Interventional Cardiology

## 2018-09-13 ENCOUNTER — Encounter (HOSPITAL_COMMUNITY): Payer: Self-pay

## 2018-09-13 ENCOUNTER — Encounter

## 2018-09-13 ENCOUNTER — Encounter: Payer: Self-pay | Admitting: Interventional Cardiology

## 2018-09-13 VITALS — BP 132/76 | HR 83 | Ht 62.0 in | Wt 176.0 lb

## 2018-09-13 DIAGNOSIS — I25118 Atherosclerotic heart disease of native coronary artery with other forms of angina pectoris: Secondary | ICD-10-CM

## 2018-09-13 DIAGNOSIS — I252 Old myocardial infarction: Secondary | ICD-10-CM | POA: Diagnosis not present

## 2018-09-13 DIAGNOSIS — E119 Type 2 diabetes mellitus without complications: Secondary | ICD-10-CM

## 2018-09-13 DIAGNOSIS — E782 Mixed hyperlipidemia: Secondary | ICD-10-CM | POA: Diagnosis not present

## 2018-09-13 NOTE — Patient Instructions (Signed)

## 2018-09-13 NOTE — Progress Notes (Signed)
Cardiology Office Note   Date:  09/13/2018   ID:  Jocelyn [name], DOB 11-19-1955, MRN [id number]  PCP:  Noralee Space, MD    No chief complaint on file.  CAD  Wt Readings from Last 3 Encounters:  09/13/18 176 lb (79.8 kg)  09/06/18 172 lb 12.8 oz (78.4 kg)  08/30/18 168 lb 3.2 oz (76.3 kg)       History of Present Illness: Jocelyn [name] is a 62 y.o. female   With CAD. Cath many years ago Showed branch vessel disease. She has had difficulty tolerating statins. Eventually, she was put on Repatha.  She had an inferior STEMI on 06/15/2017. She received a drug-eluting stent to her distal right coronary artery. She had mild LAD disease. Her diagonal vessel disease has persisted over the years. Of note, she had severe radial spasm and it was noted that she had subclavian artery lusoria variant which made right radial approach quite difficult. This anatomic abnormality had been noted on a prior CT scan done at [facility].  She had arm pain, heartburn, back pain when she had her MI.    Echo in 8/18 showed: Left ventricle: The cavity size was normal. There was mild concentric hypertrophy. Systolic function was normal. The estimated ejection fraction was in the range of 50% to 55%. Wall motion was normal; there were no regional wall motion abnormalities. Doppler parameters are consistent with abnormal left ventricular relaxation (grade 1 diastolic dysfunction). - Aortic valve: There was no regurgitation. - Mitral valve: There was mild regurgitation. - Right ventricle: The cavity size was normal. Wall thickness was normal. Systolic function was normal. - Atrial septum: No defect or patent foramen ovale was identified by color flow Doppler. - Tricuspid valve: There was no regurgitation.  She has had issues with HTN and headaches.  THis seems to have resolved.  She had a pneumonia and pleural effusion in 10/18.  It was bloody.  Her Brilinta was stopped at that  time for thoracentesis.  Denies : exertional Chest pain. Dizziness. Leg edema. Nitroglycerin use. Orthopnea. Palpitations. Paroxysmal nocturnal dyspnea. Shortness of breath. Syncope.     Past Medical History:  Diagnosis Date  . Acute bronchitis   . Allergic rhinitis, cause unspecified   . Anemia   . Anxiety   . B12 deficiency   . BV (bacterial vaginosis) 06/22/1996  . Calculus of kidney   . Calculus of kidney   . Coronary atherosclerosis of unspecified type of vessel, native or graft   . Dizziness   . Essential hypertension, benign   . Fibroid 2003  . Fibromyalgia   . H/O dysmenorrhea 2008  . H/O varicella   . Headache(784.0)    frequently  . HSV-2 infection 2009  . Hyperplastic colon polyp 05/16/2014  . Irritable bowel syndrome   . Meniere's disease, unspecified   . Menses, irregular 2003  . Myalgia and myositis, unspecified   . Obstructive sleep apnea (adult) (pediatric)   . Perimenopausal symptoms 2003  . Pure hypercholesterolemia   . Sarcoidosis   . Type II or unspecified type diabetes mellitus without mention of complication, not stated as uncontrolled   . Unspecified venous (peripheral) insufficiency   . Vitamin D deficiency   . Vulvitis 2010  . Yeast infection     Past Surgical History:  Procedure Laterality Date  . CORONARY STENT INTERVENTION N/A 06/15/2017   Procedure: CORONARY STENT INTERVENTION;  Surgeon: Jettie Booze, MD;  Location: [facility];  Service: Cardiovascular;  Laterality: N/A;  . heart catherization    . LEFT HEART CATH AND CORONARY ANGIOGRAPHY N/A 06/15/2017   Procedure: LEFT HEART CATH AND CORONARY ANGIOGRAPHY;  Surgeon: Jettie Booze, MD;  Location: [facility];  Service: Cardiovascular;  Laterality: N/A;  . TONSILLECTOMY       Current Outpatient Medications  Medication Sig Dispense Refill  . albuterol (PROAIR HFA) 108 (90 Base) MCG/ACT inhaler Inhale 1-2 puffs into the lungs every 6 (six) hours as needed for  wheezing or shortness of breath. 1 Inhaler 3  . ALPRAZolam (XANAX) 0.5 MG tablet 1/2 morning, 1/2 in afternoon, and 1 at bedtime 60 tablet 5  . aspirin 81 MG chewable tablet Chew 1 tablet (81 mg total) by mouth daily.    [name] CARTIA XT 240 MG 24 hr capsule TAKE 1 CAPSULE(240 MG) BY MOUTH DAILY 90 capsule 1  . Cyanocobalamin 1000 MCG/ML KIT Inject 1,000 mg as directed every 30 (thirty) days.    . diclofenac sodium (VOLTAREN) 1 % GEL Apply 2 g topically as needed (for pain).    . Empagliflozin-metFORMIN HCl ER (SYNJARDY XR) 25-1000 MG TB24 Take 1 tablet by mouth daily.    . fluticasone (FLONASE) 50 MCG/ACT nasal spray Place 2 sprays into both nostrils as needed for allergies or rhinitis.    . Fluticasone-Salmeterol (AIRDUO RESPICLICK 195/09) 326-71 MCG/ACT AEPB Inhale 2 puffs into the lungs 2 (two) times daily. 1 each 5  . furosemide (LASIX) 20 MG tablet Take 20 mg by mouth daily as needed for fluid or edema.    [name] HP ACTHAR 80 UNIT/ML injectable gel Inject 80 Units into the skin 3 (three) times a week.     [name] HYDROcodone-homatropine (HYCODAN) 5-1.5 MG/5ML syrup Take 5 mLs by mouth every 4 (four) hours as needed for cough. 240 mL 0  . losartan (COZAAR) 100 MG tablet TAKE 1 TABLET BY MOUTH ONCE DAILY 30 tablet 7  . metoprolol tartrate (LOPRESSOR) 50 MG tablet TAKE 1 TABLET BY MOUTH TWICE A DAY 60 tablet 6  . nitroGLYCERIN (NITROSTAT) 0.4 MG SL tablet Place 1 tablet (0.4 mg total) under the tongue every 5 (five) minutes as needed for chest pain. 25 tablet 2  . ondansetron (ZOFRAN-ODT) 4 MG disintegrating tablet Take 4 mg by mouth every 8 (eight) hours as needed for nausea or vomiting.    . pantoprazole (PROTONIX) 40 MG tablet take 1 tablet by mouth once daily 90 tablet 3  . predniSONE (DELTASONE) 10 MG tablet 1 tab in morningx 1week,1/2tab in morningx1week,1/2tabeveryotherday til gone 20 tablet 0  . REPATHA SURECLICK 245 MG/ML SOAJ INJECT 1 PEN INTO THE SKIN EVERY 14 (FOURTEEN) DAYS. REFRIGERATE. 2 pen 11    . traMADol (ULTRAM) 50 MG tablet Take 1 tablet (50 mg total) by mouth 3 (three) times daily as needed. 90 tablet 0  . valACYclovir (VALTREX) 500 MG tablet Take 500 mg by mouth daily.      No current facility-administered medications for this visit.     Allergies:   Actos [pioglitazone]; Codeine; Januvia [sitagliptin]; Lactose intolerance (gi); and Morphine    Social History:  The patient  reports that she has never smoked. She has never used smokeless tobacco. She reports that she does not drink alcohol or use drugs.   Family History:  The patient's family history includes Other in her unknown relative. She was adopted.    ROS:  Please see the history of present illness.   Otherwise, review of systems are  positive for anxiety.   All other systems are reviewed and negative.    PHYSICAL EXAM: VS:  BP 132/76   Pulse 83   Ht 5' 2"  (1.575 m)   Wt 176 lb (79.8 kg)   SpO2 98%   BMI 32.19 kg/m  , BMI Body mass index is 32.19 kg/m. GEN: Well nourished, well developed, in no acute distress  HEENT: normal  Neck: no JVD, carotid bruits, or masses Cardiac: RRR; no murmurs, rubs, or gallops,no edema  Respiratory:  clear to auscultation bilaterally, normal work of breathing GI: soft, nontender, nondistended, + BS MS: no deformity or atrophy  Skin: warm and dry, no rash Neuro:  Strength and sensation are intact Psych: euthymic mood, full affect    Recent Labs: 08/16/2018: Pro B Natriuretic peptide (BNP) 36.0; TSH 0.90 08/21/2018: ALT 14; BUN 13; Creatinine, Ser 0.82; Hemoglobin 9.9; Platelets 303; Potassium 3.8; Sodium 136   Lipid Panel    Component Value Date/Time   CHOL 191 08/18/2018 1207   TRIG 181.0 (H) 05/30/2018 1444   HDL 56.30 05/30/2018 1444   CHOLHDL 4 05/30/2018 1444   VLDL 36.2 05/30/2018 1444   LDLCALC 109 (H) 05/30/2018 1444   LDLDIRECT 91.6 08/14/2014 0822     Other studies Reviewed: Additional studies/ records that were reviewed today with results  demonstrating: hospital records.   ASSESSMENT AND PLAN:  1. CAD/Old MI: OK to continue aspirin monotherapy. Continue aggressive secondary prevention.  Once her lung issues are resolved, would consider switching her aspirin to clopidogrel monotherapy. 2. Hyperlipidemia:  Resume Repatha.  This was on hold during her lung issues. 3. DM: Resume exercise.  COntinue healthy diet.  Appetite was poor, but getting better.  4. OK to restart cardiac rehab.   Current medicines are reviewed at length with the patient today.  The patient concerns regarding her medicines were addressed.  The following changes have been made:  No change  Labs/ tests ordered today include:  No orders of the defined types were placed in this encounter.   Recommend 150 minutes/week of aerobic exercise Low fat, low carb, high fiber diet recommended  Disposition:   FU in 6 months   Signed, Larae Grooms, MD  09/13/2018 [facility] [place], [place], [place]  [address] Phone: [phone]; Fax: [phone]

## 2018-09-15 ENCOUNTER — Encounter (HOSPITAL_COMMUNITY): Payer: Self-pay | Attending: Interventional Cardiology

## 2018-09-15 DIAGNOSIS — Z955 Presence of coronary angioplasty implant and graft: Secondary | ICD-10-CM | POA: Insufficient documentation

## 2018-09-15 DIAGNOSIS — I2111 ST elevation (STEMI) myocardial infarction involving right coronary artery: Secondary | ICD-10-CM | POA: Insufficient documentation

## 2018-09-18 ENCOUNTER — Encounter (HOSPITAL_COMMUNITY): Payer: Self-pay

## 2018-09-20 ENCOUNTER — Encounter (HOSPITAL_COMMUNITY): Payer: Self-pay

## 2018-09-22 ENCOUNTER — Encounter (HOSPITAL_COMMUNITY): Payer: Self-pay

## 2018-09-25 ENCOUNTER — Encounter (HOSPITAL_COMMUNITY): Payer: Self-pay

## 2018-09-27 ENCOUNTER — Encounter (HOSPITAL_COMMUNITY): Payer: Self-pay

## 2018-09-29 ENCOUNTER — Encounter (HOSPITAL_COMMUNITY): Payer: Self-pay

## 2018-10-02 ENCOUNTER — Encounter (HOSPITAL_COMMUNITY): Payer: Self-pay

## 2018-10-03 ENCOUNTER — Ambulatory Visit: Payer: BC Managed Care – PPO | Admitting: Pulmonary Disease

## 2018-10-04 ENCOUNTER — Encounter (HOSPITAL_COMMUNITY): Payer: Self-pay

## 2018-10-06 ENCOUNTER — Other Ambulatory Visit: Payer: Self-pay | Admitting: Pharmacist

## 2018-10-06 ENCOUNTER — Encounter (HOSPITAL_COMMUNITY): Payer: Self-pay

## 2018-10-06 MED ORDER — EVOLOCUMAB 140 MG/ML ~~LOC~~ SOAJ: 1.0000 "pen " | SUBCUTANEOUS | 11 refills | Status: DC

## 2018-10-09 ENCOUNTER — Encounter (HOSPITAL_COMMUNITY): Payer: Self-pay

## 2018-10-11 ENCOUNTER — Encounter (HOSPITAL_COMMUNITY): Payer: Self-pay

## 2018-10-16 ENCOUNTER — Other Ambulatory Visit: Payer: Self-pay | Admitting: Interventional Cardiology

## 2018-10-16 ENCOUNTER — Encounter (HOSPITAL_COMMUNITY): Payer: Self-pay | Attending: Interventional Cardiology

## 2018-10-16 ENCOUNTER — Other Ambulatory Visit: Payer: Self-pay | Admitting: Pulmonary Disease

## 2018-10-16 DIAGNOSIS — I2111 ST elevation (STEMI) myocardial infarction involving right coronary artery: Secondary | ICD-10-CM | POA: Insufficient documentation

## 2018-10-16 DIAGNOSIS — Z955 Presence of coronary angioplasty implant and graft: Secondary | ICD-10-CM | POA: Insufficient documentation

## 2018-10-17 ENCOUNTER — Telehealth (HOSPITAL_COMMUNITY): Payer: Self-pay | Admitting: *Deleted

## 2018-10-17 NOTE — Telephone Encounter (Signed)
Per 08/21/18 hospital d/c summary  --Lastly, during her hospital stay patient's Jocelyn [name] was discontinued due to the presence of the hemothorax. After discussion with patient's cardiologist, Dr. Irish Lack, it was decided against continuing the Brilenta at this time. Patient to follow-up with Dr. Irish Lack.

## 2018-10-18 ENCOUNTER — Encounter (HOSPITAL_COMMUNITY): Payer: Self-pay

## 2018-10-20 ENCOUNTER — Encounter (HOSPITAL_COMMUNITY): Payer: Self-pay

## 2018-10-20 ENCOUNTER — Telehealth: Payer: Self-pay | Admitting: Pulmonary Disease

## 2018-10-20 DIAGNOSIS — J449 Chronic obstructive pulmonary disease, unspecified: Secondary | ICD-10-CM

## 2018-10-20 NOTE — Telephone Encounter (Signed)
Patient is requesting a CXR prior to her appt. I called and let the patient know that because she called so late in the afternoon this would have to be addressed Monday prior to her appt. I did verify with patient that she would have to go to [place] location for her CXR. Patient voices understanding.   SN please advise if patient can have a CXR prior to appt on Monday at 12. Thanks.

## 2018-10-23 ENCOUNTER — Encounter: Payer: Self-pay | Admitting: Pulmonary Disease

## 2018-10-23 ENCOUNTER — Ambulatory Visit (INDEPENDENT_AMBULATORY_CARE_PROVIDER_SITE_OTHER): Payer: BC Managed Care – PPO

## 2018-10-23 ENCOUNTER — Encounter: Payer: Self-pay | Admitting: *Deleted

## 2018-10-23 ENCOUNTER — Encounter (HOSPITAL_COMMUNITY): Payer: Self-pay

## 2018-10-23 ENCOUNTER — Ambulatory Visit (INDEPENDENT_AMBULATORY_CARE_PROVIDER_SITE_OTHER)
Admission: RE | Admit: 2018-10-23 | Discharge: 2018-10-23 | Disposition: A | Discharge status: Home / Self Care | Payer: BC Managed Care – PPO | Source: Ambulatory Visit | Attending: Pulmonary Disease | Admitting: Pulmonary Disease

## 2018-10-23 ENCOUNTER — Ambulatory Visit: Payer: BC Managed Care – PPO | Admitting: Pulmonary Disease

## 2018-10-23 VITALS — BP 126/74 | HR 71 | Temp 98.0°F | Ht 62.0 in | Wt 175.0 lb

## 2018-10-23 DIAGNOSIS — I252 Old myocardial infarction: Secondary | ICD-10-CM

## 2018-10-23 DIAGNOSIS — Z955 Presence of coronary angioplasty implant and graft: Secondary | ICD-10-CM

## 2018-10-23 DIAGNOSIS — M3320 Polymyositis, organ involvement unspecified: Secondary | ICD-10-CM

## 2018-10-23 DIAGNOSIS — R9389 Abnormal findings on diagnostic imaging of other specified body structures: Secondary | ICD-10-CM

## 2018-10-23 DIAGNOSIS — I2510 Atherosclerotic heart disease of native coronary artery without angina pectoris: Secondary | ICD-10-CM

## 2018-10-23 DIAGNOSIS — J189 Pneumonia, unspecified organism: Secondary | ICD-10-CM

## 2018-10-23 DIAGNOSIS — E7800 Pure hypercholesterolemia, unspecified: Secondary | ICD-10-CM

## 2018-10-23 DIAGNOSIS — F411 Generalized anxiety disorder: Secondary | ICD-10-CM

## 2018-10-23 DIAGNOSIS — I10 Essential (primary) hypertension: Secondary | ICD-10-CM

## 2018-10-23 DIAGNOSIS — Z23 Encounter for immunization: Secondary | ICD-10-CM

## 2018-10-23 DIAGNOSIS — E119 Type 2 diabetes mellitus without complications: Secondary | ICD-10-CM

## 2018-10-23 DIAGNOSIS — Z862 Personal history of diseases of the blood and blood-forming organs and certain disorders involving the immune mechanism: Secondary | ICD-10-CM | POA: Diagnosis not present

## 2018-10-23 DIAGNOSIS — J90 Pleural effusion, not elsewhere classified: Secondary | ICD-10-CM

## 2018-10-23 DIAGNOSIS — J449 Chronic obstructive pulmonary disease, unspecified: Secondary | ICD-10-CM

## 2018-10-23 DIAGNOSIS — J181 Lobar pneumonia, unspecified organism: Secondary | ICD-10-CM

## 2018-10-23 NOTE — Telephone Encounter (Signed)
Per SN- ok for CXR before appointment.  CXR ordered.  Patient notified of order.  Instructed to go to [place] office for CXR and OV at [facility]. Understanding stated. Nothing further at this time.

## 2018-10-23 NOTE — Addendum Note (Signed)
Addended by: Noralee Space on: 10/23/2018 07:09 AM   Modules accepted: Level of Service

## 2018-10-23 NOTE — Progress Notes (Addendum)
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 05/2018 reviewed, weakness & musc pain said to be better on the Acthar rx; She is also c/o LBP & wants TRAMADOL refilled- ok...     Meningioma>  She fell 05/2018 w/o serious injury but CT Head showed no acute intracranial abnormalities, small calcif meningioma along left vertex, atherosclerotic changes, no hemorrhage/ mass effect/ or CVA    Anxiety>  On Xanax 0.30m 1/2-1 tab Tid...    VitB12 & VitD defic>  On B12 10040m IM Qmo per [name] &  B12 level 154 in 2014, last done 8/19 = 470;  she tells me that [name] treated her low VitD w/ 50K weekly, then switched to daily OTC supplement & last VitD level =23, followed by him.    Borderline anemia & low Ferritin>  she was treated w/ Fe from [name] & improved... EXAM shows Afeb, VSS, O2sat=97% on RA; Wt=175#, 5'2"Tall, BMI=32; Heent- neg, mallampati2; Chest- clear w/o w/w/r, no consolidation, sl chest wall tender to palpation; Heart- RR gr1/6 SEM w/o r/g; Abd- soft, neg; Ext- VI, tr edema...  CXR 10/23/18>  Cardiomegaly, interval clearing of the left effusion and basilar airspace disease...   IMP/PLAN>>  DeMulans improved & her recent LLL pneumonia & effusion have resolved;  She has mult chronic medical issues as noted above &  they are maintained by a bevy of specialty physicians> Pulm- AB, hx Sarcoid, Cards- CAD/ s/p MI/ HL on Repatha, Endo- [name], Rheum- DrBeekman on Acthar for polymyositis, etc;  We discussed my up-coming retirement & need to select a new PCP & we will have her f/u w/ DrJEllison in 61m81mo         Problems List:  MENIERE'S DISEASE (ICD-3 - eval by ENT, DrBates- Rx w/ diuretic, low salt, Xanax vs Valium, & Antivert Prn... [facility] recurred & [facility] we don't have notes but pt reports that nothing was found.  ALLERGIC RHINITIS - increased allergy symptoms in the spring... rec> Claritin, Saline, Flonase...  Hx of ASTHMATIC BRONCHITIS, ACUTE  - requires occas Antibiotics and Medrol for infectious exac- last Oct09 & resolved w/ Avelox/ Depo/ Medrol... has not been on regular inhalers, but uses XOPENEX MDI (w/ spacer), MUCNew Bostonn... ~  essentially neg CTChest 9/08 after CXR suggested RULnodule. ~  CXR 10/09 showed cardiomegaly, clear lungs, min scoliosis, NAD. ~  CXR 9/11 showed cardiomeg, clear lungs, NAD... [name]  CXR 1/14 showed cardiomeg, clear lungs, no adenopathy, mild scoliosis... ~  CXR  4/15 showed mild cardiomeg, clear lungs, NAD... ~  PFT 10/15 showed FVC=1.93 (79%), FEV1=1.55 (79%), %1sec=80%, mid-flows=70% predicted...  ~  CXR showed mild cardiomeg & sl tortuous Ao, clear lungs, DDD of Tspine, NAD... ~  10/16:  We wrote for an AlbutHFA inhaler for prn use at her request for wheezing... ~  12/16: presents w/ refractory AB- given Levaquin, already on Pred per Rheum, added Advair100Bid + Mucinex600-2Bid... ~  02/02/16: another bout of refractory AB- treated w/ Depo, Medrol taper, Augmentin, Mucinex, Proventil...  Hx of SARCOIDOSIS  - Dx'd 1980's w/ bronch bx... Rx Pred transiently and improved... no active disease x years... ~  baseline CXRs showed cardiomegaly, clear lungs, min scoliosis, NAD.  Hx of Mild OBSTRUCTIVE SLEEP APNEA - sleep eval DrClance 2006 w/ RDI=  5 only.   FOLLOWED by [facility] FOR CARDIOLOGY >>   HYPERTENSION, BENIGN  >> followeed by [name] now for CARDS...  ~  on TOPROL XL 71m- 1.5 tabs daily, CARDIZEM 2416md, LASIX 2023must as needed now... ~  prev OV's 130-140's / 80's... prev noted sl HA, chest tightness, sl SOB, & mult somatic complaints... Labs & renal- all WNL. ~  4/12:  BP=122/76 today, and similar at home per pt... She denies CP, palpit, SOB, edema, etc... Labs & renal- all WNL. ~  1/14: on ASA81, Imdur120, ToprolXL75, Cardizem240, Lasix20prn (seldom takes); BP=128/80 & she has mult somatic c/o- CWP, tired/no energy, but denies palpit, ch in SOB, edema, etc; followed by [facility]- DrTurner/ [name] & seen recently but we don't have note; they prev added RANEXA 500m3md for her CP (now off this med); we discussed trial Lunesta 2mgQ34mfor sleep & Tramadol 50mgT5mor pain... ~  9/14:  on ASA81, Imdur120, Toprol50-1/2Bid, Cardizem240, Lasix20prn (seldom takes); BP=132/74 & she has mult somatic c/o- CWP, tired/no energy, but denies palpit, ch in SOB, edema, etc; followed by [facility]- DrTurner/ [name], no recent notes ~  4/15: on ASA81, Toprol50-1/2Bid, Cardizem240, Lasix20prn (ave 1/d) & ?Amlodip5 from Kerr?;[employer]124/76 & she has mult somatic c/o- CWP, tired/no energy, but denies palpit, ch in SOB, edema, etc; followed by [facility]- [name], no recent notes. ~  4/16: on ASA81, Metop25Bid, CardizemCD240, Losar100, Lasix40; BP= 146/88 and reminded to elim sodium & get weight down...  CAD- s/p IWMI 8/18 w/ thrombectomy/stent by [name] - prev followed by DrTTurner... ~  Cath in 2000 showed non-obstructive CAD (20-30% lesions in all 3 vessels) w/ rec for risk factor modification.   ~  NuclearStressTest 4/07 showed no ischemia & EF=73%. ~  Mar10:  she's concerned about BP and chest tightness, requests Cardiac eval DrTurner & we will refer... ~  2DEcho 4/10 showed norm LVF w/ EF=55-60%, norm MV, norm AoV,  Gr1DD ~  4/10 eval by DrTurner w/ MYOVIEW- neg- breast attenuation, no regional wall motion abn, EF= 75% ~  4/10 Cath by DrTurner w/ 90% small 2nd diag branch of LAD <too sm for PTCA> & luminal irreg up to 20% in RCA, +DD- Med Rx. ~  She had more chest tightness & another Myoview from DrTurner 8/12- it too was neg, no ischemia, no infarct; +breast attenuation; EF=70% & nno regional wall motion abnormalities;  IMDUR has been incr to 120mg/d22m 1/14: [facility]'t have notes from him> EKG sent showed NSR, rate72, wnl, NAD... ~  EKG 5/14 showed NSR, rate 93, WNL, NAD... ~  12/14: she saw [name] for Cards f/u (note reviewed)> HBP,  CAD, Chol; off Imdur w/o angina, continue same meds including both Cardizem & Amlodipine, encouraged to continue [employer] study... ~  She continues regular f/u visits w/ [name], CARDS> his notes are reviewed... ~  08/22/17>  Now on ASA81, Brillinta90Bid, Metop50Bid, CardizemCD240, Losar100, Lasix20prn + Repatha; followed by [place] - she was referred to the [facility] foot clinic by DrWWoods and seen by DrSevier 6/08= ven insuffic Rx w/ TED's, no salt, elevation, etc. ~  10/10: notes bilat ankle ?lipoma in front of the lat malleoli- asymptomatic but several people have noticed them and she request refer to Ortho (rec DrBednarz when she is ready).  HYPERCHOLESTEROLEMIA  - on LIPITOR 92m/d & ZETIA 152md + CoQ10 per DrTurner... Diet & exercise stressed to the pt. ~  FLP here 12/07 on Lip10 showed TChol 156, TG 94, HDL 51, LDL 86 ~  FLP 1/09 on Lip10 showed TChol 157, TG 122, HDL 49, LDL 84 ~  [facility] on Lip10 showed TChol 176, TG 116, HDL 55, LDL 98 ~  FLP 3/10 on Lip10 showed TChol 170, TG 94, HDL 59, LDL 93 ~  FLP 6/10 by DrTurner on Lip20 showed TChol 134, TG 82, HDL 55, LDL 73 ~  FLP 4/11 here on Lip20+Zetia? showed TChol 145, TG 74, HDL 68, LDL 63 ~  9/11:  she reports that  DrTurner incr Lipitor to 402mplus the Zetia 18m81mnd she does her FLPs. ~  FLP here 4/12 on Lip40+Zetia showed TChol 153, TG 54, HDL 53, LDL 90... Copy sent to DrTurner ~  FLP PlatinumDrKerr 7/13 on Lip40+Zetia10 showed TChol 137, TG 89, HDL 42, LDL 73 ~  9/14: on Lip40 => [place] ?Lip80 +[facility] study drug?; she has gained 4# to 190# today; [facility] & she reports concern for "particle size" & last avail FLP was 7/14 by [name] w/ TChol 190, HDL 100, LDL 40 ~  FLP followed by [name] for CARDS, and [name] for Endocrine... ~  Now on REPATHA shots Q14d...   FOLLOWED by [name] FOR ENDOCRINOLOGY >>   DIABETES MELLITUS  - on Metformin 500mg35m+ PARLODEL 2.5mg/d64mr DrEllison... She is intol to Actos w/ edema, she stopped Januvia due to ?side effect, she wondered about Tajenta since her daugh works for [employer] 9/08 showed FBS=119, HgA1c=6.6 ~  [facility] BS= 108, HgA1c= 6.5 ~  labs 3/10 showed BS= 113, A1c= 6.6 ~  labs 6/10 by DrTurner showed BS= 92; and 3/11 BS= 127 ~  labs 4/11 showed BS= 124, A1c= 7.6... recMarland Kitchen incr Metform to Bid; she [facility] at [facility]... ~  6/11:  pt requested Endocrine consult & seen by DrEllison w/ Januvia 100mg/d18med. ~  labs 9/11 showed BS= 98, A1c= 7.3... she Marland KitchenMarland Kitchenopped Januvia due to side effects & wonders about using Tajenta instead. ~  DrEllison started BromocyAuto-Owners Insuranced..79mhe is now taking this +Metform 500mgBid.53m  Labs 4/12 (wt=195#) showed Bs= 116, A1c= 7.1 ~  Labs 7/12 showed A1c= 7.2;  Umicroalb= 3.5 ~  Labs 10/12 (wt=186#) showed A1c= 6.7 NOTE: She participated in a [facility] trial called the AfricanAmerican- Diabetes Heart Study in 2012; they sent a report indicating: 1) her memory was fine, but her MRI Brain showed sm vessel dis & mild sinus dis, otherw neg; 2) she may have treatable depression but we tried her on Zoloft & she was  intol==> ch to Lexapro. ~  followed  by [name] on Metform500Bid & Onglyza2.5; labs from [place] 9/13 showed BS=201, A1c=6.0; he rec same meds... ~  9/14: followed by [name] on Metform500Bid & Onglyza2.5 => ?he changed to Kombiglyze5-1000 daily?; last labs from [place] 7/14 showed BS=105, A1c=6.5; and she remains on the same meds... ~  1/15: she had f/u [name] for Endocrine> DM, Obesity, Adrenal adenoma, VitB12 defic; labs reviewed, note reviewed- he tried Belviq but she was intol... ~  She continues regular follow up w/ [name]... ~  Switched to [facility] 08-999 Qam after her IWMI 8/18... A1c=6.8   FOLLOWED by [name] et al for GASTORENTEROLOGY >>   GERD SYMPTOMS - pt placed on PROTONIX 47m/d w/ improvement in reflux symptoms...    ADDENDUM>> note from [name] indicates> upper endoscopy in July 1994 showed antral gastritis. 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  IRRITABLE BOWEL SYNDROME  - colonoscopy 7/03 by [name] was WNL. ~  9/14: she was referred to GI for f/u colon but never did it... ~  4/15: we will refer her to GI again...  RENAL CALCULUS  - sm stone seen in L kidney on CTScan... [facility].  POLYMYOSITIS >> now followed by DrBeekman FIBROMYALGIA  - prev Rx by [name] in 2006... ~  1/14: This is her CC w/ not resting well, wakes tired, poor energy, aching/ sore/ tender (esp in chest in AM) & we discussed trial Lunesta 279mhs, & Tramadol 5058md; plus rest, heat, etc... States she's INTOL Ambien w/ weird dreams & Benedryl/ Melatonin w/o help... ~  11/14: she went to the ER w/ LBP> felt to be a lumbar strain... 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 supplemented w/ OTC Vit D 1000 u daily. ~  Vit D level was lower from [name] & he treated w/ Vit D 50K weekly for awhile, then switched to OTC supplement...  ANXIETY - on ALPRAZOLAM 0.5mg28mn... DEPRESSION - prev on Lexapro 20mg58mut she stopped on her own ~10/13 ?cost issues? ~  4/12: c/o feeling sad & anxious all the time- weak, not resting well, under a lot of stress; rec to seek counselling thru her local church or let us reKorear to [facility] w/ Zoloft 50==> but she was INTOL... ~  2013: switched to Lexapro & seems to be doing better but didn't stick w/ it due to cost issues...  ANEMIA & LOW FERRITIN LEVEL >>  B12 DEFICIENCY >> treated by [name] w/ B12 shots ~  9/14: she tells me that Ferritin is low & [name] tried her on Oral Fe prep w/o improvement & wants me to f/u and treat this problem; we discussed need to recheck labs here- LABS 9/14 showed Hg=12.5, MCV=88, Fe=47 (12%sat), Ferritin=10.8; she  is due for f/u colon w/ [name] & we will refer, rec to start FeSO4 352m Bid w/ VitC500 w/ plans for f/u CBC, Fe studies in 2-343mo~  4/15:  Labs showed Hg= 12.5 but Fe=31 (8%); she was only taking the Fe once daily 7 advised to incr to Bid w/ vitC500; needs f/u colon & we will refer to [name] again...  HEALTH MAINTENANCE: ~  GI:  Followed by [name] & colon due 7/13... ~  GYN:  Followed by DrDillard & she gets yearly PAP, Mammogram, hasn't had baseline BMD yet, [facility]... ~  Immunizations:  She gets the yearly Flu shots at school;  Had PNEUMOVAX previously; TDAP given 4/12...   Past Surgical History:  Procedure Laterality  Date  . CORONARY STENT INTERVENTION N/A 06/15/2017   Procedure: CORONARY STENT INTERVENTION;  Surgeon: VaJettie BoozeMD;  [facility];  Service: Cardiovascular;  Laterality: N/A;  . heart catherization    . LEFT HEART CATH AND CORONARY ANGIOGRAPHY N/A 06/15/2017   Procedure: LEFT HEART CATH AND CORONARY ANGIOGRAPHY;  Surgeon: VaJettie BoozeMD;  [facility];  Service: Cardiovascular;  Laterality: N/A;  . TONSILLECTOMY    No other past surgical history on file - other than Lithotripsy for renal stone...   Outpatient Encounter Medications as of 10/23/2018  Medication Sig  . albuterol (PROAIR HFA) 108 (90 Base) MCG/ACT inhaler Inhale 1-2 puffs into the lungs every 6 (six) hours as needed for wheezing or shortness of breath.  . ALPRAZolam (XANAX) 0.5 MG tablet 1/2 morning, 1/2 in afternoon, and 1 at bedtime  . aspirin 81 MG chewable tablet Chew 1 tablet (81 mg total) by mouth daily.  . Marland KitchenARTIA XT 240 MG 24 hr capsule TAKE 1 CAPSULE(240 MG) BY MOUTH DAILY  . Cyanocobalamin 1000 MCG/ML KIT Inject 1,000 mg as directed every 30 (thirty) days.  . diclofenac sodium (VOLTAREN) 1 % GEL Apply 2 g topically as needed (for pain).  . Empagliflozin-metFORMIN HCl ER (SYNJARDY XR) 25-1000 MG TB24 Take 1 tablet by mouth daily.  . Evolocumab (REPATHA SURECLICK) 14229G/ML SOAJ Inject 1 pen into the skin every 14 (fourteen) days.  . fluticasone (FLONASE) 50 MCG/ACT nasal spray Place 2 sprays into both nostrils as needed for allergies or rhinitis.  . Fluticasone-Salmeterol (AIRDUO RESPICLICK 23798/9223119-41CG/ACT AEPB Inhale 2 puffs into the lungs 2 (two) times daily.  . furosemide (LASIX) 20 MG tablet Take 20 mg by mouth daily as needed for fluid or edema.  . Marland KitchenP ACTHAR 80 UNIT/ML injectable gel Inject 80 Units into the skin 3 (three) times a week.   . Marland KitchenYDROcodone-homatropine (HYCODAN) 5-1.5 MG/5ML syrup Take 5 mLs by mouth every 4 (four) hours as needed for cough.  .  losartan (COZAAR) 100 MG tablet TAKE 1 TABLET BY MOUTH ONCE DAILY  . metoprolol tartrate (LOPRESSOR) 50 MG tablet TAKE 1 TABLET BY MOUTH TWICE A DAY  . nitroGLYCERIN (NITROSTAT) 0.4 MG SL tablet Place 1 tablet (0.4 mg total) under the tongue every 5 (five) minutes as needed for chest pain.  . Marland Kitchenndansetron (ZOFRAN-ODT) 4 MG disintegrating tablet Take 4 mg by mouth every 8 (eight) hours as needed for nausea or vomiting.  . pantoprazole (PROTONIX) 40 MG tablet TAKE 1 TABLET BY MOUTH ONCE DAILY  . traMADol (ULTRAM) 50 MG tablet Take 1 tablet (50 mg total) by mouth 3 (three) times daily as needed.  . valACYclovir (VALTREX) 500 MG tablet Take 500 mg by  mouth daily.   . [DISCONTINUED] predniSONE (DELTASONE) 10 MG tablet 1 tab in morningx 1week,1/2tab in morningx1week,1/2tabeveryotherday til gone   No facility-administered encounter medications on file as of 10/23/2018.     Allergies  Allergen Reactions  . Actos [Pioglitazone] Other (See Comments)    REACTION: pt states INTOL w/ edema  . Codeine Other (See Comments)    REACTION: vomiting  . Januvia [Sitagliptin] Nausea Only  . Lactose Intolerance (Gi) Diarrhea  . Morphine Nausea Only and Nausea And Vomiting    REACTION: vomiting REACTION: vomiting    Immunization History  Administered Date(s) Administered  . Influenza Split 08/16/2011, 08/16/2012, 08/16/2013, 09/04/2014  . Influenza Whole 09/16/2009, 08/15/2010  . Influenza,inj,Quad PF,6+ Mos 08/30/2016, 06/16/2017  . Influenza-Unspecified 08/19/2015  . Pneumococcal Polysaccharide-23 11/15/2004  . Tdap 02/22/2011    Current Medications, Allergies, Past Medical History, Past Surgical History, Family History, and Social History were reviewed in [employer] record.   Review of Systems        See HPI - all other systems neg except as noted...      The patient complains of weight gain, dyspnea on exertion, and peripheral edema.  The patient denies anorexia, fever,  weight loss, vision loss, decreased hearing, hoarseness, chest pain, syncope, prolonged cough, headaches, hemoptysis, abdominal pain, melena, hematochezia, severe indigestion/heartburn, hematuria, incontinence, genital sores, suspicious skin lesions, transient blindness, difficulty walking, depression, unusual weight change, abnormal bleeding, enlarged lymph nodes, and angioedema.     Objective:   Physical Exam     WD, Overweight, 62 y/o BF in NAD... GENERAL:  Alert & oriented; pleasant & cooperative. HEENT:  [place]/AT, EOM-wnl, PERRLA, EACs-clear, TMs-wnl, NOSE-clear, THROAT-clear & wnl. NECK:  Supple w/ fairROM; no JVD; normal carotid impulses w/o bruits; no thyromegaly or nodules palpated; no lymphadenopathy. CHEST:  decrBS at bases, few scar rhonchi & end-exp wheezing, no rales, no consolidation... HEART:  Regular Rhythm; without murmurs/ rubs/ or gallops. ABDOMEN:  Soft & nontender; normal bowel sounds; no organomegaly or masses detected. EXT: without deformities, mild arthritic changes; no varicose veins but +venous insuffic & tr edema; +trigger points. NEURO:  CN's intact;  no focal neuro deficits... DERM:  No lesions noted; no rash etc...  RADIOLOGY DATA:  Reviewed in the EPIC EMR & discussed w/ the patient...  LABORATORY DATA:  Reviewed in the EPIC EMR & discussed w/ the patient...   Assessment & Plan:    RESP>  Hx AR, HxAsthma, old sarcoid, mild OSA>  All stable, breathing OK, notes some wheezing w/ activity; exam reveals some secretions in the airway & rec to take MUCINEX 1268m bid, Fluids, etc;. CXR remains stable/ NAD...  10/20/15>  Presented w/ refractory AB- already on Pred- given Depo80, adding Levaquin500 x7d, ADVAIR100Bid, Mucinex600-2Bid + fluids... 02/02/16>  Presents w/ another refractory AB- currently off Pred & MTX, on Imuran; treated w/ Depo, Medrol taper, Augmentin, Mucinex, Albut=HFA...  04/2016>  Back to baseline 11/04/16>  She has URI & we wrote for ZPak => subseq  Augmentin, Saline, flonase, Mucinex... 05/05/17>   DLexineis improved s/p Augmentin & Pred Rx for her URI & AB exac;  She remains on Symbicort80-2spBid + Mucinex600- 1to2Bid w/ extra fluids;  I reminded her to be sure to requests notes sent to [place] they are not on Epic;  She will maintain f/u w/ these specialists + [name] for CARDS;  We plan recheck in 3-421mo4/8/19>   We refilled XANAX 0.67m867mtake 1/2 tab bid &  1Qhs #60/mo;  We refilled PROAIR-HFA rescue inhaler, and rec trial AIRDUO 232-14 2spBid ($50 cash price from [employer]);  We also refilled her TRAMADOL per request;  She will maintain f/u w/ DrBeekman, [name], [name], and her Concha [place]; she will seek a new PCP as I am going to retire later this yr. 05/30/18>   Novella is stable but persists w/ mult somatic complaints attributed to mult organ systems- she is advised to continue her current meds and maintain f/u w/ her numerous medical specialists...  08/16/18>   Yarelin has some incr cough, dyspnea, chest discomfort but not much to help w/ dx x some chest wall tenderness on exam=> we will proceed w/ CT Chest for further eval;  At this juncture we will change the ZPak/ Medrol dosepak to DOXY169mBid x10d & PRED126m 4dTaper, and advise rest, heat, Tramadol+Tylenol for the discomfort;  She has AINPYYFR102spBid & ProairHFA rescue inhaler for prn use;  She understandably has some anxiety & rec incr Alprazolam0.42m22mrom 1Qhs to add extra 1/2tab morn & afternoon; we will hopefully know more once the CT is done. 08/30/18>   Etiology is not apparent but clinical course most compatible w/ infection (viral vs bact), poss LLL pneumonia and parapneumonic effusion;  However the pleural surface has irreg thickening and poss mass-like thickening in medial LUL surface, thiis needs further evaluation & she is quite anxious;  REC to start back on empiric AUGMENTIN 8742m2m and PREDNISONE tapering schedule;  We could consider PET scan, and discussion w/  radiology says they could do needle bx of the left medial apical pleural based mass;  I am concerned re: poss trapped lung & feel that TSurg consult w/ consideration of VATS exploration and pleurectomy may be the better approach=> TSurg consult placed 09/06/18>   DeniYairels some better on the Augmentin + Pred; CXR sl improved; we decided to extend the Augmentin to 10d, and giver her a longer/ slow Pred taper; we plan rov recheck in about 6wks w/ CXR & Labs 10/23/18>   DeniLanaiyaimproved & her recent LLL pneumonia & effusion have resolved;  She has mult chronic medical issues as noted above & they are maintained by a bevy of specialty physicians> Pulm- AB, hx Sarcoid, Cards- CAD/ s/p MI/ HL on Repatha, Endo- [name], Rheum- DrBeekman on Acthar for polymyositis, etc;  We discussed my up-coming retirement & need to select a new PCP & we will have her f/u w/ DrJEllison in 8mo.842moARDS>  Followed by DrTTurner/ VaranRica Motemeds listed? SHE DID NOT BRING MEDS TO THE OV> Hx HBP, CAD, VI, etc;  Chest pain is felt to be non-cardiac & likely related to her FM; Doing satis & we reviewed exercise program etc...  CHOL>  Managed by [facility]Var[facility] CenterCards- intol Lip + Zetia; she took part in a study drug from [employer] [facility] => now on PRALUENT thru [facility]Williams[facility] ClinicDM>  Followed by [name] on Metformin, Onglyzal; A1c improved to 6.7 in 2012, then 6.0 in 2013, & 6.5  In 2014;  rec continue meds & asked to have records sent to us...[place]GI>  Stable on Protonix as needed; see GI eval from [name] 7/15...  POLYMYOSITIS/ FM>  Diagnoses w/ Polymyositis recently (2016) & started on Pred, MTX, folate by DrBeekman=> switched to ACTHAAMR Corporationinsurance doesn't want to pay, she is reminded to have records sent to us...KoreaAnxiety>  She remains on Alprazolam as needed; & she stopped the Lexapro..  Anemia, Low  Ferritin> on FeSO4 356m & VitC 500...   B12 Defic> on B12 shots per [name]...  ANXIETY>  DViciehas been having  progressive difficulties at work (Youth workerat [employer]; due to this stress and her mult medical issues including polymyositis (DrBeekman), DM2 ([name]), heart problems ([name])- she tells me she is taking a LOA for the rest of this school yr & planning on retirement...   Patient's Medications  New Prescriptions   No medications on file  Previous Medications   ALBUTEROL (PROAIR HFA) 108 (90 BASE) MCG/ACT INHALER    Inhale 1-2 puffs into the lungs every 6 (six) hours as needed for wheezing or shortness of breath.   ALPRAZOLAM (XANAX) 0.5 MG TABLET    1/2 morning, 1/2 in afternoon, and 1 at bedtime   ASPIRIN 81 MG CHEWABLE TABLET    Chew 1 tablet (81 mg total) by mouth daily.   CARTIA XT 240 MG 24 HR CAPSULE    TAKE 1 CAPSULE(240 MG) BY MOUTH DAILY   CYANOCOBALAMIN 1000 MCG/ML KIT    Inject 1,000 mg as directed every 30 (thirty) days.   DICLOFENAC SODIUM (VOLTAREN) 1 % GEL    Apply 2 g topically as needed (for pain).   EMPAGLIFLOZIN-METFORMIN HCL ER (SYNJARDY XR) 25-1000 MG TB24    Take 1 tablet by mouth daily.   EVOLOCUMAB (REPATHA SURECLICK) 1010MG/ML SOAJ    Inject 1 pen into the skin every 14 (fourteen) days.   FLUTICASONE (FLONASE) 50 MCG/ACT NASAL SPRAY    Place 2 sprays into both nostrils as needed for allergies or rhinitis.   FLUTICASONE-SALMETEROL (AIRDUO RESPICLICK 2071/21 2975-88MCG/ACT AEPB    Inhale 2 puffs into the lungs 2 (two) times daily.   FUROSEMIDE (LASIX) 20 MG TABLET    Take 20 mg by mouth daily as needed for fluid or edema.   HP ACTHAR 80 UNIT/ML INJECTABLE GEL    Inject 80 Units into the skin 3 (three) times a week.    HYDROCODONE-HOMATROPINE (HYCODAN) 5-1.5 MG/5ML SYRUP    Take 5 mLs by mouth every 4 (four) hours as needed for cough.   LOSARTAN (COZAAR) 100 MG TABLET    TAKE 1 TABLET BY MOUTH ONCE DAILY   METOPROLOL TARTRATE (LOPRESSOR) 50 MG TABLET    TAKE 1 TABLET BY MOUTH TWICE A DAY   NITROGLYCERIN (NITROSTAT) 0.4 MG SL TABLET    Place 1  tablet (0.4 mg total) under the tongue every 5 (five) minutes as needed for chest pain.   ONDANSETRON (ZOFRAN-ODT) 4 MG DISINTEGRATING TABLET    Take 4 mg by mouth every 8 (eight) hours as needed for nausea or vomiting.   PANTOPRAZOLE (PROTONIX) 40 MG TABLET    TAKE 1 TABLET BY MOUTH ONCE DAILY   TRAMADOL (ULTRAM) 50 MG TABLET    Take 1 tablet (50 mg total) by mouth 3 (three) times daily as needed.   VALACYCLOVIR (VALTREX) 500 MG TABLET    Take 500 mg by mouth daily.   Modified Medications   No medications on file  Discontinued Medications   PREDNISONE (DELTASONE) 10 MG TABLET    1 tab in morningx 1week,1/2tab in morningx1week,1/2tabeveryotherday til gone

## 2018-10-23 NOTE — Patient Instructions (Addendum)
Today we updated your med list in our EPIC system...    Continue your current medications the same...  Today we rechecked your CXR & it is much improved!!!  We gave you the 2019 FLU vaccine today...  We discussed my up-coming retirement & the need to select a new PRIMARY CARE provider...    We reviewed the available primary care office sites with you today- try the [place] office.  We will also arrange for a follow up PULMONARY appt w/ my young partner- Dr. Rodman Pickle in about 2-3 months time...  Call for any questions or problems in the interim...  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    Wishing you & your family a very merry Christmas & a happy and health new year!!

## 2018-10-24 ENCOUNTER — Telehealth: Payer: Self-pay | Admitting: Pulmonary Disease

## 2018-10-24 MED ORDER — PANTOPRAZOLE SODIUM 40 MG PO TBEC: 40.0000 mg | DELAYED_RELEASE_TABLET | Freq: Every day | ORAL | 3 refills | Status: DC

## 2018-10-24 NOTE — Telephone Encounter (Addendum)
Spoke with pt, she states she was needing a note from Dr. [name] to go back to work and refills of her medication. She wasn't sure when she was able to go back to work. SN please advise.   Jocelyn [name] the patient stated she told you what needed to be in the letter. She also brought in a rib belt and wanted to know if that was the correct item that SN told her to get for her back.   Current Outpatient Medications on File Prior to Visit  Medication Sig Dispense Refill  . albuterol (PROAIR HFA) 108 (90 Base) MCG/ACT inhaler Inhale 1-2 puffs into the lungs every 6 (six) hours as needed for wheezing or shortness of breath. 1 Inhaler 3  . ALPRAZolam (XANAX) 0.5 MG tablet 1/2 morning, 1/2 in afternoon, and 1 at bedtime 60 tablet 5  . aspirin 81 MG chewable tablet Chew 1 tablet (81 mg total) by mouth daily.    [name] CARTIA XT 240 MG 24 hr capsule TAKE 1 CAPSULE(240 MG) BY MOUTH DAILY 90 capsule 1  . Cyanocobalamin 1000 MCG/ML KIT Inject 1,000 mg as directed every 30 (thirty) days.    . diclofenac sodium (VOLTAREN) 1 % GEL Apply 2 g topically as needed (for pain).    . Empagliflozin-metFORMIN HCl ER (SYNJARDY XR) 25-1000 MG TB24 Take 1 tablet by mouth daily.    . Evolocumab (REPATHA SURECLICK) 383 MG/ML SOAJ Inject 1 pen into the skin every 14 (fourteen) days. 2 pen 11  . fluticasone (FLONASE) 50 MCG/ACT nasal spray Place 2 sprays into both nostrils as needed for allergies or rhinitis.    . Fluticasone-Salmeterol (AIRDUO RESPICLICK 291/91) 660-60 MCG/ACT AEPB Inhale 2 puffs into the lungs 2 (two) times daily. 1 each 5  . furosemide (LASIX) 20 MG tablet Take 20 mg by mouth daily as needed for fluid or edema.    [name] HP ACTHAR 80 UNIT/ML injectable gel Inject 80 Units into the skin 3 (three) times a week.     [name] HYDROcodone-homatropine (HYCODAN) 5-1.5 MG/5ML syrup Take 5 mLs by mouth every 4 (four) hours as needed for cough. 240 mL 0  . losartan (COZAAR) 100 MG tablet TAKE 1 TABLET BY MOUTH ONCE DAILY 30 tablet 7  .  metoprolol tartrate (LOPRESSOR) 50 MG tablet TAKE 1 TABLET BY MOUTH TWICE A DAY 60 tablet 6  . nitroGLYCERIN (NITROSTAT) 0.4 MG SL tablet Place 1 tablet (0.4 mg total) under the tongue every 5 (five) minutes as needed for chest pain. 25 tablet 2  . ondansetron (ZOFRAN-ODT) 4 MG disintegrating tablet Take 4 mg by mouth every 8 (eight) hours as needed for nausea or vomiting.    . pantoprazole (PROTONIX) 40 MG tablet TAKE 1 TABLET BY MOUTH ONCE DAILY 90 tablet 0  . traMADol (ULTRAM) 50 MG tablet Take 1 tablet (50 mg total) by mouth 3 (three) times daily as needed. 90 tablet 0  . valACYclovir (VALTREX) 500 MG tablet Take 500 mg by mouth daily.      No current facility-administered medications on file prior to visit.    Allergies  Allergen Reactions  . Actos [Pioglitazone] Other (See Comments)    REACTION: pt states INTOL w/ edema  . Codeine Other (See Comments)    REACTION: vomiting  . Januvia [Sitagliptin] Nausea Only  . Lactose Intolerance (Gi) Diarrhea  . Morphine Nausea Only and Nausea And Vomiting    REACTION: vomiting REACTION: vomiting

## 2018-10-25 ENCOUNTER — Encounter (HOSPITAL_COMMUNITY): Payer: Self-pay

## 2018-10-25 MED ORDER — CLOTRIMAZOLE-BETAMETHASONE 1-0.05 % EX CREA: 1.0000 "application " | TOPICAL_CREAM | CUTANEOUS | 6 refills | Status: DC

## 2018-10-25 NOTE — Telephone Encounter (Signed)
Patient called back to follow up on this message; to see if she is able to pick up the note; pt contact # [phone]

## 2018-10-25 NOTE — Telephone Encounter (Signed)
Spoke with pt, she wants to be out until Dec 15th and return on 10/30/2018. I called pt to advise her that letter was ready to pick up.   She also requested a hydrocortisone cream for itching. Lattie Haw was given the message and will discuss with Dr. [name].

## 2018-10-25 NOTE — Telephone Encounter (Signed)
Per SN- lotrisone cream, as directed. Prescription sent to preferred pharmacy.  Nothing further at this time.

## 2018-10-26 ENCOUNTER — Ambulatory Visit: Payer: BC Managed Care – PPO | Admitting: Pulmonary Disease

## 2018-10-27 ENCOUNTER — Encounter (HOSPITAL_COMMUNITY): Payer: Self-pay

## 2018-10-30 ENCOUNTER — Encounter (HOSPITAL_COMMUNITY): Payer: Self-pay

## 2018-10-31 ENCOUNTER — Ambulatory Visit: Payer: BC Managed Care – PPO | Admitting: Pulmonary Disease

## 2018-11-01 ENCOUNTER — Encounter (HOSPITAL_COMMUNITY): Payer: Self-pay

## 2018-11-03 ENCOUNTER — Encounter (HOSPITAL_COMMUNITY): Payer: Self-pay

## 2018-11-06 ENCOUNTER — Encounter (HOSPITAL_COMMUNITY): Payer: Self-pay

## 2018-11-10 ENCOUNTER — Encounter (HOSPITAL_COMMUNITY): Payer: Self-pay

## 2018-11-13 ENCOUNTER — Encounter (HOSPITAL_COMMUNITY): Payer: Self-pay

## 2018-11-15 ENCOUNTER — Other Ambulatory Visit: Payer: Self-pay | Admitting: Cardiology

## 2018-11-17 ENCOUNTER — Encounter (HOSPITAL_COMMUNITY): Payer: Self-pay | Attending: Interventional Cardiology

## 2018-11-20 ENCOUNTER — Encounter (HOSPITAL_COMMUNITY): Payer: Self-pay

## 2018-11-21 ENCOUNTER — Telehealth (HOSPITAL_COMMUNITY): Payer: Self-pay | Admitting: *Deleted

## 2018-11-21 NOTE — Telephone Encounter (Signed)
Spoke with patient today regarding participation in the cardiac rehab maintenance program. Pt would like to discharge from the program at this time due to back & knee problems. Pt is aware that she is responsible for the Nov & Dec bills and will be discharged as of January. Pt was concerned that she was not getting the self-pay discount of $61.20/month but is working with patient accounting to get the correct billing for November and December.  Pt wants to return to the program, possibly in February. Pt will call to re-enroll when she is physically able.  Sol Passer, MS, ACSM CEP

## 2018-11-22 ENCOUNTER — Encounter (HOSPITAL_COMMUNITY): Payer: Self-pay

## 2018-11-24 ENCOUNTER — Encounter (HOSPITAL_COMMUNITY): Payer: Self-pay

## 2018-11-27 ENCOUNTER — Encounter (HOSPITAL_COMMUNITY): Payer: Self-pay

## 2018-11-29 ENCOUNTER — Encounter (HOSPITAL_COMMUNITY): Payer: Self-pay

## 2018-12-01 ENCOUNTER — Encounter (HOSPITAL_COMMUNITY): Payer: Self-pay

## 2018-12-04 ENCOUNTER — Encounter (HOSPITAL_COMMUNITY): Payer: Self-pay

## 2018-12-06 ENCOUNTER — Encounter (HOSPITAL_COMMUNITY): Payer: Self-pay

## 2018-12-08 ENCOUNTER — Encounter (HOSPITAL_COMMUNITY): Payer: Self-pay

## 2018-12-11 ENCOUNTER — Encounter (HOSPITAL_COMMUNITY): Payer: Self-pay

## 2018-12-12 ENCOUNTER — Other Ambulatory Visit: Payer: Self-pay | Admitting: Pulmonary Disease

## 2018-12-12 NOTE — Telephone Encounter (Signed)
Per Lattie Haw, will route to her to follow up on.

## 2018-12-13 ENCOUNTER — Encounter (HOSPITAL_COMMUNITY): Payer: Self-pay

## 2018-12-15 ENCOUNTER — Encounter (HOSPITAL_COMMUNITY): Payer: Self-pay

## 2018-12-18 ENCOUNTER — Telehealth: Payer: Self-pay | Admitting: Interventional Cardiology

## 2018-12-18 ENCOUNTER — Encounter (HOSPITAL_COMMUNITY): Payer: Self-pay

## 2018-12-18 ENCOUNTER — Encounter: Payer: Self-pay | Admitting: Interventional Cardiology

## 2018-12-18 MED ORDER — NITROGLYCERIN 0.4 MG SL SUBL: 0.4000 mg | SUBLINGUAL_TABLET | SUBLINGUAL | 2 refills | Status: DC | PRN

## 2018-12-18 NOTE — Telephone Encounter (Signed)
Patient calling complaining of intermittent episodes of tightness in the center of her chest and back that started on Friday. She states that she was up moving around cleaning the house when it happened. She states that she has been SOB on exertion as well. Patient states that she tried to use her NTG but she thinks that it was expired. New Rx for NTG sent to preferred pharmacy. Patient denies any pain at this time. Appointment made for tomorrow 2/4 at 3:20 PM. ER precautions reviewed with the patient. Patient verbalized understanding and thanked me for the call.

## 2018-12-18 NOTE — Telephone Encounter (Signed)
Pt's medication has already been sent to pt's pharmacy as requested. Confirmation received.  

## 2018-12-18 NOTE — Telephone Encounter (Signed)
New Message   Pt c/o of Chest Pain: STAT if CP now or developed within 24 hours  1. Are you having CP right now? No   2. Are you experiencing any other symptoms (ex. SOB, nausea, vomiting, sweating)?  Shortness of breath   3. How long have you been experiencing CP? Past few days  4. Is your CP continuous or coming and going? Coming and going   5. Have you taken Nitroglycerin? Yes, took it yesterday but realized it was expired and that it did not seem to work    Pt c/o Shortness Of Breath: STAT if SOB developed within the last 24 hours or pt is noticeably SOB on the phone  1. Are you currently SOB (can you hear that pt is SOB on the phone)? Yes, can hear it slightly when speaking with patient but not real heavy   2. How long have you been experiencing SOB? Few days   3. Are you SOB when sitting or when up moving around? Both sitting and moving   4. Are you currently experiencing any other symptoms? Chest tightness    ?

## 2018-12-18 NOTE — Telephone Encounter (Signed)
New Message    *STAT* If patient is at the pharmacy, call can be transferred to refill team.   1. Which medications need to be refilled? (please list name of each medication and dose if known)   nitroGLYCERIN (NITROSTAT) 0.4 MG SL tablet     2. Which pharmacy/location (including street and city if local pharmacy) is medication to be sent to?[facility] [phone] - [place], [place]  3. Do they need a 30 day or 90 day supply? [place]

## 2018-12-19 ENCOUNTER — Ambulatory Visit: Payer: BC Managed Care – PPO | Admitting: Interventional Cardiology

## 2018-12-19 ENCOUNTER — Encounter: Payer: Self-pay | Admitting: Interventional Cardiology

## 2018-12-19 VITALS — BP 114/76 | HR 85 | Ht 62.0 in | Wt 176.0 lb

## 2018-12-19 DIAGNOSIS — R0602 Shortness of breath: Secondary | ICD-10-CM

## 2018-12-19 DIAGNOSIS — R0789 Other chest pain: Secondary | ICD-10-CM

## 2018-12-19 DIAGNOSIS — I25118 Atherosclerotic heart disease of native coronary artery with other forms of angina pectoris: Secondary | ICD-10-CM

## 2018-12-19 DIAGNOSIS — E782 Mixed hyperlipidemia: Secondary | ICD-10-CM | POA: Diagnosis not present

## 2018-12-19 DIAGNOSIS — I252 Old myocardial infarction: Secondary | ICD-10-CM

## 2018-12-19 NOTE — Patient Instructions (Signed)
Medication Instructions:  Your physician recommends that you continue on your current medications as directed. Please refer to the Current Medication list given to you today.  If you need a refill on your cardiac medications before your next appointment, please call your pharmacy.   Lab work: None Ordered  If you have labs (blood work) drawn today and your tests are completely normal, you will receive your results only by: . MyChart Message (if you have MyChart) OR . A paper copy in the mail If you have any lab test that is abnormal or we need to change your treatment, we will call you to review the results.  Testing/Procedures: Your physician has requested that you have en exercise stress myoview. For further information please visit [email or web address]. Please follow instruction sheet, as given.  Follow-Up: . Based on test results  Any Other Special Instructions Will Be Listed Below (If Applicable).    

## 2018-12-19 NOTE — Addendum Note (Signed)
Addended by: Drue Novel I on: 12/19/2018 04:55 PM   Modules accepted: Orders

## 2018-12-19 NOTE — Progress Notes (Addendum)
Cardiology Office Note   Date:  12/19/2018   ID:  Jocelyn [name], DOB October 03, 1956, MRN [id number]  PCP:  Noralee Space, MD    No chief complaint on file.  CAD  Wt Readings from Last 3 Encounters:  12/19/18 176 lb (79.8 kg)  10/23/18 175 lb (79.4 kg)  09/13/18 176 lb (79.8 kg)       History of Present Illness: Jocelyn [name] is a 63 y.o. female  With CAD. Cath many years ago Showed branch vessel disease. She has had difficulty tolerating statins. Eventually, she was put on Repatha.  She had an inferior STEMI on 06/15/2017. She received a drug-eluting stent to her distal right coronary artery. She had mild LAD disease. Her diagonal vessel disease has persisted over the years. Of note, she had severe radial spasm and it was noted that she had subclavian artery lusoria variant which made right radial approach quite difficult. This anatomic abnormality had been noted on a prior CT scan done at [facility].She had arm pain, heartburn, back pain when she had her MI.   Echo in 8/18 showed: Left ventricle: The cavity size was normal. There was mild concentric hypertrophy. Systolic function was normal. The estimated ejection fraction was in the range of 50% to 55%. Wall motion was normal; there were no regional wall motion abnormalities. Doppler parameters are consistent with abnormal left ventricular relaxation (grade 1 diastolic dysfunction). - Aortic valve: There was no regurgitation. - Mitral valve: There was mild regurgitation. - Right ventricle: The cavity size was normal. Wall thickness was normal. Systolic function was normal. - Atrial septum: No defect or patent foramen ovale was identified by color flow Doppler. - Tricuspid valve: There was no regurgitation.  She has had issues with HTN and headaches.THis seems to have resolved.  She had a pneumonia and pleural effusion in 10/18.  It was bloody.  Her Brilinta was stopped at that time for  thoracentesis.  Since the last visit, she has had some SHOB.  It is worse with walking.  She tries to walk for exercise, but she is only walking the dog.    She has had some tightness in the chest and upper back, with exertion.      Past Medical History:  Diagnosis Date  . Acute bronchitis   . Allergic rhinitis, cause unspecified   . Anemia   . Anxiety   . B12 deficiency   . BV (bacterial vaginosis) 06/22/1996  . Calculus of kidney   . Calculus of kidney   . Coronary atherosclerosis of unspecified type of vessel, native or graft   . Dizziness   . Essential hypertension, benign   . Fibroid 2003  . Fibromyalgia   . H/O dysmenorrhea 2008  . H/O varicella   . Headache(784.0)    frequently  . HSV-2 infection 2009  . Hyperplastic colon polyp 05/16/2014  . Irritable bowel syndrome   . Meniere's disease, unspecified   . Menses, irregular 2003  . Myalgia and myositis, unspecified   . Obstructive sleep apnea (adult) (pediatric)   . Perimenopausal symptoms 2003  . Pure hypercholesterolemia   . Sarcoidosis   . Type II or unspecified type diabetes mellitus without mention of complication, not stated as uncontrolled   . Unspecified venous (peripheral) insufficiency   . Vitamin D deficiency   . Vulvitis 2010  . Yeast infection     Past Surgical History:  Procedure Laterality Date  . CORONARY STENT INTERVENTION N/A 06/15/2017  Procedure: CORONARY STENT INTERVENTION;  Surgeon: Jettie Booze, MD;  Location: [place] [facility];  Service: Cardiovascular;  Laterality: N/A;  . heart catherization    . LEFT HEART CATH AND CORONARY ANGIOGRAPHY N/A 06/15/2017   Procedure: LEFT HEART CATH AND CORONARY ANGIOGRAPHY;  Surgeon: Jettie Booze, MD;  Location: [place] [facility];  Service: Cardiovascular;  Laterality: N/A;  . TONSILLECTOMY       Current Outpatient Medications  Medication Sig Dispense Refill  . albuterol (PROAIR HFA) 108 (90 Base) MCG/ACT inhaler Inhale 1-2 puffs into  the lungs every 6 (six) hours as needed for wheezing or shortness of breath. 1 Inhaler 3  . ALPRAZolam (XANAX) 0.5 MG tablet TAKE 1/2 TABLET IN THE MORNING AND AFTERNOON, AND 1 TABLET AT BEDTIME 60 tablet 1  . aspirin 81 MG chewable tablet Chew 1 tablet (81 mg total) by mouth daily.    [name] CARTIA XT 240 MG 24 hr capsule TAKE 1 CAPSULE(240 MG) BY MOUTH DAILY 90 capsule 1  . Empagliflozin-metFORMIN HCl ER (SYNJARDY XR) 25-1000 MG TB24 Take 1 tablet by mouth daily.    . Evolocumab (REPATHA SURECLICK) 542 MG/ML SOAJ Inject 1 pen into the skin every 14 (fourteen) days. 2 pen 11  . furosemide (LASIX) 20 MG tablet Take 20 mg by mouth daily as needed for fluid or edema.    [name] HP ACTHAR 80 UNIT/ML injectable gel Inject 80 Units into the skin 3 (three) times a week.     . losartan (COZAAR) 100 MG tablet TAKE 1 TABLET BY MOUTH ONCE DAILY 30 tablet 7  . metoprolol tartrate (LOPRESSOR) 50 MG tablet TAKE 1 TABLET BY MOUTH TWICE A DAY 60 tablet 9  . nitroGLYCERIN (NITROSTAT) 0.4 MG SL tablet Place 1 tablet (0.4 mg total) under the tongue every 5 (five) minutes as needed for chest pain. 25 tablet 2  . ondansetron (ZOFRAN-ODT) 4 MG disintegrating tablet Take 4 mg by mouth every 8 (eight) hours as needed for nausea or vomiting.    . pantoprazole (PROTONIX) 40 MG tablet Take 1 tablet (40 mg total) by mouth daily. 90 tablet 3  . traMADol (ULTRAM) 50 MG tablet Take 1 tablet (50 mg total) by mouth 3 (three) times daily as needed. 90 tablet 0  . valACYclovir (VALTREX) 500 MG tablet Take 500 mg by mouth daily.      No current facility-administered medications for this visit.     Allergies:   Actos [pioglitazone]; Codeine; Januvia [sitagliptin]; Lactose intolerance (gi); and Morphine    Social History:  The patient  reports that she has never smoked. She has never used smokeless tobacco. She reports that she does not drink alcohol or use drugs.   Family History:  The patient's family history includes Other in an other  family member. She was adopted.    ROS:  Please see the history of present illness.   Otherwise, review of systems are positive for chest pain, SHOB.   All other systems are reviewed and negative.    PHYSICAL EXAM: VS:  BP 114/76   Pulse 85   Ht 5\' 2"  (1.575 m)   Wt 176 lb (79.8 kg)   SpO2 99%   BMI 32.19 kg/m  , BMI Body mass index is 32.19 kg/m. GEN: Well nourished, well developed, in no acute distress  HEENT: normal  Neck: no JVD, carotid bruits, or masses Cardiac: RRR; no murmurs, rubs, or gallops,no edema  Respiratory:  clear to auscultation bilaterally, normal work of breathing  GI: soft, nontender, nondistended, + BS MS: no deformity or atrophy  Skin: warm and dry, no rash Neuro:  Strength and sensation are intact Psych: euthymic mood, full affect   EKG:   The ekg ordered today demonstrates NSR, no ST changes, poor R wave progression   Recent Labs: 08/16/2018: Pro B Natriuretic peptide (BNP) 36.0; TSH 0.90 08/21/2018: ALT 14; BUN 13; Creatinine, Ser 0.82; Hemoglobin 9.9; Platelets 303; Potassium 3.8; Sodium 136   Lipid Panel    Component Value Date/Time   CHOL 191 08/18/2018 1207   TRIG 181.0 (H) 05/30/2018 1444   HDL 56.30 05/30/2018 1444   CHOLHDL 4 05/30/2018 1444   VLDL 36.2 05/30/2018 1444   LDLCALC 109 (H) 05/30/2018 1444   LDLDIRECT 91.6 08/14/2014 0822     Other studies Reviewed: Additional studies/ records that were reviewed today with results demonstrating: prior cath reviewed.   ASSESSMENT AND PLAN:  1. CAD/Old MI: Some chest pain, some atypical features.  Will plan for nuclear stress test.  If stress test is ok, wil have her try to exercise more.  Cardiac rehab is expensive so she may be better off joining a gym.   2. Hyperlipidemia: LDL 109, on PCSK9 inhibitor- will recheck lipids at time of stress test.  3. DM: A1C 6.5.   COntinue healthy diet.     Current medicines are reviewed at length with the patient today.  The patient concerns  regarding her medicines were addressed.  The following changes have been made:  No change  Labs/ tests ordered today include:  No orders of the defined types were placed in this encounter.   Recommend 150 minutes/week of aerobic exercise Low fat, low carb, high fiber diet recommended  Disposition:   FU in for stress test   Signed, Larae Grooms, MD  12/19/2018 4:03 PM    [facility] [address], [place], [place]  [address] Phone: [phone]; Fax: [phone]

## 2018-12-20 ENCOUNTER — Encounter (HOSPITAL_COMMUNITY): Payer: Self-pay

## 2018-12-22 ENCOUNTER — Encounter: Payer: Self-pay | Admitting: Pulmonary Disease

## 2018-12-22 ENCOUNTER — Ambulatory Visit: Payer: BC Managed Care – PPO | Admitting: Pulmonary Disease

## 2018-12-22 VITALS — BP 116/60 | HR 70 | Ht 62.0 in | Wt 175.6 lb

## 2018-12-22 DIAGNOSIS — R0602 Shortness of breath: Secondary | ICD-10-CM

## 2018-12-22 DIAGNOSIS — J948 Other specified pleural conditions: Secondary | ICD-10-CM | POA: Diagnosis not present

## 2018-12-22 NOTE — Patient Instructions (Signed)
-  CT chest high resolution in 2 months. -Follow-up in 2 months with Dr. Loanne Drilling after CT chest.

## 2018-12-22 NOTE — Progress Notes (Signed)
Synopsis: Referred in 12/2018 for chronic bronchitis  Subjective:   PATIENT ID: Jocelyn [name] GENDER: female DOB: Jan 05, 1956, MRN: [id number]   HPI  Chief Complaint  Patient presents with  . Consult    former Jocelyn [name] pt, mild SOB, mild chest tightness, cough in AM    Ms. Jocelyn [name] is a 63 year old female with chronic bronchitis, ?asthma, ?sarcoid diagnosed via bronchoscopy in 1980s without recurrence, ?OSA, HTN, CAD s/p DES to RCA in 5852, chronic diastolic heart failure and polymyositis previously on methotrexate and anxiety who presents for chronic bronchitis and management of pleural lung mass in left medial chest.  She is a former patient of Dr. Lenna [name] and is transferring her care to me after his retirement. On review of her records, she has followed Dr. Lenna [name] for yearly episodes of acute bronchitis treated with antibiotics, inhalers and steroids. Notes briefly mention hx of sarcoid and asthma though it does not seem Pulmonary is currently managing those issues. She is followed by Cardiology and planned for nuclear stress test. She is compliant with her Airduo Respiclick (Fluticasone-salmetorol). She has chronic dyspnea on exertion and is sedentary at baseline  Of note, she was recently hospitalized in October 2020 for pneumonia with pleural effusions s/p left thoracentesis. She was last seen in December with Dr. Lenna [name] and reported improving symptoms during that visit however still had persistent mass-thickening. CTS consult was placed at the time and patient was placed on prolonged course of antibiotics.. She still has shortness of breath and intermittent chest tightness on exertion. Denies fevers, chills, weight loss, night sweats, nausea.   Social History: Never smoker. 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  I have personally reviewed patient's past  medical/family/social history, allergies, current medications.  Past Medical History:  Diagnosis Date  . Acute bronchitis   . Allergic rhinitis, cause unspecified   . Anemia   . Anxiety   . B12 deficiency   . BV (bacterial vaginosis) 06/22/1996  . Calculus of kidney   . Calculus of kidney   . Coronary atherosclerosis of unspecified type of vessel, native or graft   . Dizziness   . Essential hypertension, benign   . Fibroid 2003  . Fibromyalgia   . H/O dysmenorrhea 2008  . H/O varicella   . Headache(784.0)    frequently  . HSV-2 infection 2009  . Hyperplastic colon polyp 05/16/2014  . Irritable bowel syndrome   . Meniere's disease, unspecified   . Menses, irregular 2003  . Myalgia and myositis, unspecified   . Obstructive sleep apnea (adult) (pediatric)   . Perimenopausal symptoms 2003  . Pure hypercholesterolemia   . Sarcoidosis   . Type II or unspecified type diabetes mellitus without mention of complication, not stated as uncontrolled   . Unspecified venous (peripheral) insufficiency   . Vitamin D deficiency   . Vulvitis 2010  . Yeast infection      Family History  Adopted: Yes  Problem Relation Age of Onset  . Other Other        ADOPTED  . Colon cancer Neg Hx   . Esophageal cancer Neg Hx   . Rectal cancer Neg Hx   . Stomach cancer Neg Hx      Social History   Occupational History  . Not on file  Tobacco Use  . Smoking status: Never Smoker  . Smokeless tobacco: Never Used  .  Tobacco comment: Daily Caffeine - 1  Exercise 2-3 times/weekly  Substance and Sexual Activity  . Alcohol use: No    Alcohol/week: 0.0 standard drinks  . Drug use: No  . Sexual activity: Yes    Birth control/protection: None    Allergies  Allergen Reactions  . Actos [Pioglitazone] Other (See Comments)    REACTION: pt states INTOL w/ edema  . Codeine Other (See Comments)    REACTION: vomiting  . Januvia [Sitagliptin] Nausea Only  . Lactose Intolerance (Gi) Diarrhea  . Morphine  Nausea Only and Nausea And Vomiting    REACTION: vomiting REACTION: vomiting     Outpatient Medications Prior to Visit  Medication Sig Dispense Refill  . albuterol (PROAIR HFA) 108 (90 Base) MCG/ACT inhaler Inhale 1-2 puffs into the lungs every 6 (six) hours as needed for wheezing or shortness of breath. 1 Inhaler 3  . ALPRAZolam (XANAX) 0.5 MG tablet TAKE 1/2 TABLET IN THE MORNING AND AFTERNOON, AND 1 TABLET AT BEDTIME 60 tablet 1  . aspirin 81 MG chewable tablet Chew 1 tablet (81 mg total) by mouth daily.    [name] CARTIA XT 240 MG 24 hr capsule TAKE 1 CAPSULE(240 MG) BY MOUTH DAILY 90 capsule 1  . Empagliflozin-metFORMIN HCl ER (SYNJARDY XR) 25-1000 MG TB24 Take 1 tablet by mouth daily.    . Evolocumab (REPATHA SURECLICK) 824 MG/ML SOAJ Inject 1 pen into the skin every 14 (fourteen) days. 2 pen 11  . HP ACTHAR 80 UNIT/ML injectable gel Inject 80 Units into the skin 3 (three) times a week.     . losartan (COZAAR) 100 MG tablet TAKE 1 TABLET BY MOUTH ONCE DAILY 30 tablet 7  . metoprolol tartrate (LOPRESSOR) 50 MG tablet TAKE 1 TABLET BY MOUTH TWICE A DAY 60 tablet 9  . nitroGLYCERIN (NITROSTAT) 0.4 MG SL tablet Place 1 tablet (0.4 mg total) under the tongue every 5 (five) minutes as needed for chest pain. 25 tablet 2  . ondansetron (ZOFRAN-ODT) 4 MG disintegrating tablet Take 4 mg by mouth every 8 (eight) hours as needed for nausea or vomiting.    . pantoprazole (PROTONIX) 40 MG tablet Take 1 tablet (40 mg total) by mouth daily. 90 tablet 3  . traMADol (ULTRAM) 50 MG tablet Take 1 tablet (50 mg total) by mouth 3 (three) times daily as needed. 90 tablet 0  . valACYclovir (VALTREX) 500 MG tablet Take 500 mg by mouth daily.     . furosemide (LASIX) 20 MG tablet Take 20 mg by mouth daily as needed for fluid or edema.     No facility-administered medications prior to visit.     ROS   Objective:   Vitals:   12/22/18 1437  BP: 116/60  Pulse: 70  SpO2: 97%  Weight: 175 lb 9.6 oz (79.7 kg)    Height: 5\' 2"  (1.575 m)   SpO2: 97 % O2 Device: None (Room air)  Physical Exam General: Well-appearing, no acute distress HENT: , AT, OP clear, MMM Eyes: EOMI, no scleral icterus Respiratory: Clear to auscultation bilaterally.  No crackles, wheezing or rales Cardiovascular: RRR, -M/R/G, no JVD GI: BS+, soft, nontender Extremities:-Edema,-tenderness Neuro: AAO x4, CNII-XII grossly intact Skin: Intact, no rashes or bruising Psych: Normal mood, normal affect  Chest imaging: CXR 10/23/18 - Resolved left pleural effusion. CT Chest 08/18/18- 3cm pleural mass on the medial left chest, LLL collapse  PFT: 09/09/14- Borderline mild obstructive defect  Ratio 80 (LLN 80) FEV1 79% 08/16/18 - Poor quality study  Imaging, labs and tests noted above have been reviewed independently by me.    Assessment & Plan:   Discussion: 63 year old female with multiple medical issues who presents for follow-up of lung mass s/p antibiotics. Symptomatically improved however with persistent dyspnea. Scheduled for stress test with Cardiology. Lung mass since 08/2018 with unclear etiology with no associated lymphadenopathy or B symptoms. Will re-evaluate with CT Chest with contrast. Pending results will re-refer to CTS to consider biopsy.  Pleural left medial lung mass - CT Chest with and without contrast in April 2020  Chronic Bronchitis ?Asthma - Continue bronchodilators  Mild OSA Sleep study in 2006 - Will address at next visit  Hx sarcoid Diagnosed on bronchoscopy in 1980s without recurrence  Orders Placed This Encounter  Procedures  . CT Chest High Resolution    Standing Status:   Future    Standing Expiration Date:   02/20/2020    Scheduling Instructions:     Patient needs high res CT chest with and without contrast. This is two separate tests that need to be completed. One with contrast at high res and one without.    Order Specific Question:   Preferred imaging location?    Answer:    [facility]    Order Specific Question:   Radiology Contrast Protocol - do NOT remove file path    Answer:   \\[email or web address].pdf  . CT Chest High Resolution    Standing Status:   Future    Standing Expiration Date:   02/20/2020    Scheduling Instructions:     Patient needs high res CT chest with and without contrast. This is two separate tests that need to be completed. One with contrast at high res and one without.    Order Specific Question:   Preferred imaging location?    Answer:   [facility]    Order Specific Question:   Radiology Contrast Protocol - do NOT remove file path    Answer:   \\[email or web address]  No orders of the defined types were placed in this encounter.   Return in about 2 months (around 02/20/2019).   Greater than 50% of this patient 45-minute office visit was spent face-to-face in counseling with the patient/family. We discussed medical diagnosis and treatment plan as noted.   Daiquan Resnik Rodman Pickle, MD [facility] 12/24/2018 4:55 PM  Personal pager: [phone] If unanswered, please page CCM On-call: [phone]

## 2018-12-25 ENCOUNTER — Encounter (HOSPITAL_COMMUNITY): Payer: Self-pay

## 2018-12-25 ENCOUNTER — Other Ambulatory Visit: Payer: Self-pay

## 2018-12-25 DIAGNOSIS — R0602 Shortness of breath: Secondary | ICD-10-CM

## 2018-12-25 NOTE — Progress Notes (Addendum)
New order for CT placed as requested by Dr. Loanne Drilling re: Alm Bustard.  Previous order from last week had to be cancelled and changed.  Order entered as specified by [facility] after speaking with Dr. Loanne Drilling today.

## 2018-12-26 ENCOUNTER — Telehealth (HOSPITAL_COMMUNITY): Payer: Self-pay | Admitting: *Deleted

## 2018-12-26 NOTE — Telephone Encounter (Signed)
Patient given detailed instructions per Myocardial Perfusion Study Information Sheet for the test on 12/29/18 at 1030. Patient notified to arrive 15 minutes early and that it is imperative to arrive on time for appointment to keep from having the test rescheduled.  If you need to cancel or reschedule your appointment, please call the office within 24 hours of your appointment. . Patient verbalized understanding.Jocelyn [name], Lucius Conn letter sent

## 2018-12-27 ENCOUNTER — Encounter (HOSPITAL_COMMUNITY): Payer: Self-pay

## 2018-12-29 ENCOUNTER — Other Ambulatory Visit: Payer: BC Managed Care – PPO | Admitting: *Deleted

## 2018-12-29 ENCOUNTER — Ambulatory Visit (HOSPITAL_COMMUNITY): Payer: BC Managed Care – PPO | Attending: Internal Medicine

## 2018-12-29 ENCOUNTER — Encounter (HOSPITAL_COMMUNITY): Payer: Self-pay

## 2018-12-29 DIAGNOSIS — R0789 Other chest pain: Secondary | ICD-10-CM | POA: Diagnosis present

## 2018-12-29 DIAGNOSIS — R0602 Shortness of breath: Secondary | ICD-10-CM | POA: Diagnosis present

## 2018-12-29 DIAGNOSIS — I25118 Atherosclerotic heart disease of native coronary artery with other forms of angina pectoris: Secondary | ICD-10-CM

## 2018-12-29 DIAGNOSIS — I252 Old myocardial infarction: Secondary | ICD-10-CM

## 2018-12-29 DIAGNOSIS — E782 Mixed hyperlipidemia: Secondary | ICD-10-CM

## 2018-12-29 LAB — MYOCARDIAL PERFUSION IMAGING
Estimated workload: 4.6 METS
Exercise duration (min): 3 min
Exercise duration (sec): 41 s
LV dias vol: 68 mL (ref 46–106)
LV sys vol: 25 mL
MPHR: 156 {beats}/min
Peak HR: 151 {beats}/min
Percent HR: 96 %
Rest HR: 78 {beats}/min
SDS: 5
SRS: 0
SSS: 5
TID: 1.2

## 2018-12-29 LAB — LIPID PANEL
Chol/HDL Ratio: 3.2 ratio (ref 0.0–4.4)
Cholesterol, Total: 183 mg/dL (ref 100–199)
HDL: 57 mg/dL (ref >39)
LDL Calculated: 88 mg/dL (ref 0–99)
Triglycerides: 191 mg/dL — ABNORMAL HIGH (ref 0–149)
VLDL Cholesterol Cal: 38 mg/dL (ref 5–40)

## 2018-12-29 MED ORDER — TECHNETIUM TC 99M TETROFOSMIN IV KIT
10.1000 | PACK | Freq: Once | INTRAVENOUS | Status: AC | PRN
  Administered 2018-12-29: 10.1 via INTRAVENOUS
  Filled 2018-12-29: qty 11

## 2018-12-29 MED ORDER — TECHNETIUM TC 99M TETROFOSMIN IV KIT
30.7000 | PACK | Freq: Once | INTRAVENOUS | Status: AC | PRN
  Administered 2018-12-29: 30.7 via INTRAVENOUS
  Filled 2018-12-29: qty 31

## 2019-01-01 ENCOUNTER — Encounter (HOSPITAL_COMMUNITY): Payer: Self-pay

## 2019-01-01 ENCOUNTER — Other Ambulatory Visit: Payer: Self-pay

## 2019-01-01 ENCOUNTER — Ambulatory Visit (HOSPITAL_COMMUNITY)
Admission: RE | Admit: 2019-01-01 | Discharge: 2019-01-01 | Disposition: A | Discharge status: Home / Self Care | Payer: BC Managed Care – PPO | Source: Ambulatory Visit | Attending: Pulmonary Disease | Admitting: Pulmonary Disease

## 2019-01-01 ENCOUNTER — Telehealth: Payer: Self-pay | Admitting: Pharmacist

## 2019-01-01 DIAGNOSIS — R0602 Shortness of breath: Secondary | ICD-10-CM

## 2019-01-01 MED ORDER — ICOSAPENT ETHYL 1 G PO CAPS: 2.0000 g | ORAL_CAPSULE | Freq: Two times a day (BID) | ORAL | 11 refills | Status: DC

## 2019-01-01 NOTE — Telephone Encounter (Signed)
I called [facility] and was advised that this is concerning the pts Repatha. Will forward to our pharmacist.

## 2019-01-01 NOTE — Telephone Encounter (Signed)
New Message   Dora from [facility] called about needing office to call patients insurance to obtain prior authorization.    [employer] [phone] I.D. #[id number]

## 2019-01-01 NOTE — Telephone Encounter (Signed)
Already have an approved PA on file through 04/26/19. Will fax approval letter to pharmacy.

## 2019-01-02 ENCOUNTER — Ambulatory Visit (HOSPITAL_COMMUNITY)
Admission: RE | Admit: 2019-01-02 | Discharge: 2019-01-02 | Disposition: A | Discharge status: Home / Self Care | Payer: BC Managed Care – PPO | Source: Ambulatory Visit | Attending: Pulmonary Disease | Admitting: Pulmonary Disease

## 2019-01-02 ENCOUNTER — Telehealth: Payer: Self-pay

## 2019-01-02 DIAGNOSIS — R0602 Shortness of breath: Secondary | ICD-10-CM | POA: Insufficient documentation

## 2019-01-02 MED ORDER — IOHEXOL 300 MG/ML  SOLN
100.0000 mL | Freq: Once | INTRAMUSCULAR | Status: AC | PRN
  Administered 2019-01-02: 75 mL via INTRAVENOUS

## 2019-01-02 NOTE — Telephone Encounter (Signed)
I have done a Vascepa PA through covermymeds. Key: [id number]

## 2019-01-02 NOTE — Progress Notes (Signed)
Please contact patient regarding CT results: The area in her lung we were monitoring has resolved. Please advise to keep her next appointment as we continue our evaluation for her dyspnea.

## 2019-01-02 NOTE — Telephone Encounter (Signed)
**Note De-Identified  Obfuscation** Letter received from [employer] stating that they have approved the pts Vascepa PA. Approval good from 01/02/2019 until 01/02/2022.  I have notified the pts pharmacy.

## 2019-01-03 ENCOUNTER — Encounter (HOSPITAL_COMMUNITY): Payer: Self-pay

## 2019-01-05 ENCOUNTER — Encounter (HOSPITAL_COMMUNITY): Payer: Self-pay

## 2019-01-08 ENCOUNTER — Encounter (HOSPITAL_COMMUNITY): Payer: Self-pay

## 2019-01-09 ENCOUNTER — Ambulatory Visit: Payer: BC Managed Care – PPO | Admitting: Pulmonary Disease

## 2019-01-10 ENCOUNTER — Encounter (HOSPITAL_COMMUNITY): Payer: Self-pay

## 2019-01-12 ENCOUNTER — Ambulatory Visit: Payer: BC Managed Care – PPO | Admitting: Pulmonary Disease

## 2019-01-12 ENCOUNTER — Encounter: Payer: Self-pay | Admitting: Pulmonary Disease

## 2019-01-12 ENCOUNTER — Encounter (HOSPITAL_COMMUNITY): Payer: Self-pay

## 2019-01-12 VITALS — BP 142/88 | HR 69 | Ht 62.0 in | Wt 175.0 lb

## 2019-01-12 DIAGNOSIS — G4733 Obstructive sleep apnea (adult) (pediatric): Secondary | ICD-10-CM | POA: Diagnosis not present

## 2019-01-12 MED ORDER — ALPRAZOLAM 0.5 MG PO TABS: ORAL_TABLET | ORAL | 1 refills | Status: DC

## 2019-01-12 MED ORDER — ALBUTEROL SULFATE HFA 108 (90 BASE) MCG/ACT IN AERS: 1.0000 | INHALATION_SPRAY | Freq: Four times a day (QID) | RESPIRATORY_TRACT | 3 refills | Status: DC | PRN

## 2019-01-12 MED ORDER — FLUTICASONE-SALMETEROL 100-50 MCG/DOSE IN AEPB: 2.0000 | INHALATION_SPRAY | Freq: Two times a day (BID) | RESPIRATORY_TRACT | 3 refills | Status: DC

## 2019-01-12 NOTE — Patient Instructions (Signed)
OSA -Sleep study ordered (split night)  Bronchitis -Change inhaler to Advair Diskus 100-50 mcg 2 puffs twice a day -Refill Albuterol inhaler  Anxiety -Refilled alprazolam. Discussed risks of sedation.

## 2019-01-12 NOTE — Progress Notes (Signed)
Subjective:   PATIENT ID: Jocelyn [name] GENDER: female DOB: 1956/07/18, MRN: [id number]   HPI  Chief Complaint  Patient presents with  . Follow-up    Visit to discuss Chest CT.    Ms. Jocelyn [name] is a 63 year old female with chronic bronchitis, ?asthma, sarcoid diagnosed via bronchoscopy in 1980s without recurrence, OSA, HTN, CAD s/p DES to RCA in 1941, chronic diastolic heart failure and polymyositis previously on methotrexate and anxiety who presents for follow-up of lung mass and shortness of breath.  [name] patient: Followed for annual episodes of acute bronchitis treated with antibiotics, inhalers and steroids. She has chronic dyspnea on exertion and is sedentary at baseline. Of note, during hospitalization 08/2019 found with left paramediastinal mass.  Interim Since our last visit, repeat CT demonstrated resolution of paramediastinal mass. She reports her shortness of breath has improved some but remains. Worsened with activity and anxiety, improved with rest. Denies associated chest pain, cough or wheezing. She underwent stress test was negative for ischemia with normal LV function. She reports fatigue with some daytime sleepiness. Denies headache, difficulty concentrating or focusing.  Epworth Score 9  Social History: Never smoker. 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  I have personally reviewed patient's past medical/family/social history, allergies, current medications.  Past Medical History:  Diagnosis Date  . Acute bronchitis   . Allergic rhinitis, cause unspecified   . Anemia   . Anxiety   . B12 deficiency   . BV (bacterial vaginosis) 06/22/1996  . Calculus of kidney   . Calculus of kidney   . Coronary atherosclerosis of unspecified type of vessel, native or graft   . Dizziness   . Essential hypertension, benign   . Fibroid  2003  . Fibromyalgia   . H/O dysmenorrhea 2008  . H/O varicella   . Headache(784.0)    frequently  . HSV-2 infection 2009  . Hyperplastic colon polyp 05/16/2014  . Irritable bowel syndrome   . Meniere's disease, unspecified   . Menses, irregular 2003  . Myalgia and myositis, unspecified   . Obstructive sleep apnea (adult) (pediatric)   . Perimenopausal symptoms 2003  . Pure hypercholesterolemia   . Sarcoidosis   . Type II or unspecified type diabetes mellitus without mention of complication, not stated as uncontrolled   . Unspecified venous (peripheral) insufficiency   . Vitamin D deficiency   . Vulvitis 2010  . Yeast infection      Family History  Adopted: Yes  Problem Relation Age of Onset  . Other Other        ADOPTED  . Colon cancer Neg Hx   . Esophageal cancer Neg Hx   . Rectal cancer Neg Hx   . Stomach cancer Neg Hx      Social History   Occupational History  . Not on file  Tobacco Use  . Smoking status: Never Smoker  . Smokeless tobacco: Never Used  . Tobacco comment: Daily Caffeine - 1  Exercise 2-3 times/weekly  Substance and Sexual Activity  . Alcohol use: No    Alcohol/week: 0.0 standard drinks  . Drug use: No  . Sexual activity: Yes    Birth control/protection: None    Allergies  Allergen Reactions  . Actos [Pioglitazone] Other (See Comments)    REACTION: pt states INTOL w/ edema  . Codeine Other (See Comments)  REACTION: vomiting  . Januvia [Sitagliptin] Nausea Only  . Lactose Intolerance (Gi) Diarrhea  . Morphine Nausea Only and Nausea And Vomiting    REACTION: vomiting REACTION: vomiting     Outpatient Medications Prior to Visit  Medication Sig Dispense Refill  . aspirin 81 MG chewable tablet Chew 1 tablet (81 mg total) by mouth daily.    . Empagliflozin-metFORMIN HCl ER (SYNJARDY XR) 25-1000 MG TB24 Take 1 tablet by mouth daily.    . Evolocumab (REPATHA SURECLICK) 371 MG/ML SOAJ Inject 1 pen into the skin every 14 (fourteen) days. 2  pen 11  . furosemide (LASIX) 20 MG tablet Take 20 mg by mouth daily as needed for fluid or edema.    [name] HP ACTHAR 80 UNIT/ML injectable gel Inject 80 Units into the skin 3 (three) times a week.     Vanessa Kick Ethyl (VASCEPA) 1 g CAPS Take 2 capsules (2 g total) by mouth 2 (two) times daily. 120 capsule 11  . losartan (COZAAR) 100 MG tablet TAKE 1 TABLET BY MOUTH ONCE DAILY 30 tablet 7  . metoprolol tartrate (LOPRESSOR) 50 MG tablet TAKE 1 TABLET BY MOUTH TWICE A DAY 60 tablet 9  . nitroGLYCERIN (NITROSTAT) 0.4 MG SL tablet Place 1 tablet (0.4 mg total) under the tongue every 5 (five) minutes as needed for chest pain. 25 tablet 2  . ondansetron (ZOFRAN-ODT) 4 MG disintegrating tablet Take 4 mg by mouth every 8 (eight) hours as needed for nausea or vomiting.    . pantoprazole (PROTONIX) 40 MG tablet Take 1 tablet (40 mg total) by mouth daily. 90 tablet 3  . traMADol (ULTRAM) 50 MG tablet Take 1 tablet (50 mg total) by mouth 3 (three) times daily as needed. 90 tablet 0  . valACYclovir (VALTREX) 500 MG tablet Take 500 mg by mouth daily.     [name] albuterol (PROAIR HFA) 108 (90 Base) MCG/ACT inhaler Inhale 1-2 puffs into the lungs every 6 (six) hours as needed for wheezing or shortness of breath. 1 Inhaler 3  . ALPRAZolam (XANAX) 0.5 MG tablet TAKE 1/2 TABLET IN THE MORNING AND AFTERNOON, AND 1 TABLET AT BEDTIME 60 tablet 1  . CARTIA XT 240 MG 24 hr capsule TAKE 1 CAPSULE(240 MG) BY MOUTH DAILY 90 capsule 1   No facility-administered medications prior to visit.     Review of Systems  Constitutional: Positive for malaise/fatigue. Negative for chills, diaphoresis, fever and weight loss.  HENT: Negative for congestion, ear pain and sore throat.   Respiratory: Positive for shortness of breath. Negative for cough, hemoptysis, sputum production and wheezing.   Cardiovascular: Negative for chest pain, palpitations and leg swelling.  Gastrointestinal: Negative for abdominal pain, heartburn and nausea.    Genitourinary: Negative for frequency.  Musculoskeletal: Negative for joint pain and myalgias.  Skin: Negative for itching and rash.  Neurological: Negative for dizziness, weakness and headaches.  Endo/Heme/Allergies: Does not bruise/bleed easily.  Psychiatric/Behavioral: Negative for depression. The patient is not nervous/anxious.     Objective:   Vitals:   01/12/19 1044  BP: (!) 142/88  Pulse: 69  SpO2: 94%  Weight: 175 lb (79.4 kg)  Height: 5\' 2"  (1.575 m)   SpO2: 94 %  Physical Exam: General: Well-appearing, no acute distress HENT: [place], AT, OP clear, MMM, Mallampati IV Eyes: EOMI, no scleral icterus Respiratory: Clear to auscultation bilaterally.  No crackles, wheezing or rales Cardiovascular: RRR, -M/R/G, no JVD GI: BS+, soft, nontender Extremities:-Edema,-tenderness Neuro: AAO x4, CNII-XII grossly intact Skin: Intact, no  rashes or bruising Psych: Anxious mood, normal affect  Chest imaging: CXR 10/23/18 - Resolved left pleural effusion. CT Chest 08/18/18- 3cm pleural mass on the medial left chest, LLL collapse CT chest 01/02/2019-paramediastinal consolidation in the left upper lung has resolved  PFT: 09/09/14- Borderline mild obstructive defect  Ratio 80 (LLN 80) FEV1 79% 08/16/18 - Poor quality study  Labs CBC 08/22/19 reviewed - Anemia and leukocytosis  I have personally reviewed patient's past medical/family/social history/allergies/current medications.    Assessment & Plan:   Discussion: 63 year old female with multiple medical issues who presents for follow-up of lung mass and dyspnea.  Repeat CT demonstrated resolution of lung mass.  I suspect this is related to atelectasis and may have improved with bronchodilator therapy.  Pleural left medial lung mass Resolved on last chest imaging.  Likely related to atelectasis. Reviewed chest imaging with patient and addressed questions. -Continue bronchodilator therapy as noted below -No further follow-up imaging  recommended  Chronic Bronchitis ?Asthma -Change inhaler to Advair Diskus 100-50 mcg 2 puffs twice a day -CONTINUE albuterol inhaler 2 puffs every 4 hours as needed for shortness of breath or wheezing.  Inhaler refilled  Mild OSA Sleep study in 2006 -Split-night sleep study ordered  Anxiety -Refilled alprazolam.  Discussed risks of oversedation  Hx sarcoid Diagnosed on bronchoscopy in 1980s without recurrence -No evidence of active disease  Orders Placed This Encounter  Procedures  . Split night study    Standing Status:   Future    Standing Expiration Date:   01/13/2020    Order Specific Question:   Where should this test be performed:    Answer:   [facility] ordered this encounter  Medications  . Fluticasone-Salmeterol (ADVAIR DISKUS) 100-50 MCG/DOSE AEPB    Sig: Inhale 2 puffs into the lungs 2 (two) times daily.    Dispense:  60 each    Refill:  3  . ALPRAZolam (XANAX) 0.5 MG tablet    Sig: TAKE 1/2 TABLET IN THE MORNING AND AFTERNOON, AND 1 TABLET AT BEDTIME    Dispense:  60 tablet    Refill:  1  . albuterol (PROAIR HFA) 108 (90 Base) MCG/ACT inhaler    Sig: Inhale 1-2 puffs into the lungs every 6 (six) hours as needed for wheezing or shortness of breath.    Dispense:  3 Inhaler    Refill:  3    Return in about 3 months (around 04/12/2019).   Greater than 50% of this patient 25-minute office visit was spent face-to-face in counseling with the patient/family. We discussed medical diagnosis and treatment plan as noted.  Lux Skilton Rodman Pickle, MD [facility] 01/27/2019 3:15 PM  Personal pager: [phone] If unanswered, please page CCM On-call: [phone]

## 2019-01-14 ENCOUNTER — Other Ambulatory Visit: Payer: Self-pay | Admitting: Interventional Cardiology

## 2019-01-15 ENCOUNTER — Encounter (HOSPITAL_COMMUNITY): Payer: Self-pay

## 2019-01-17 ENCOUNTER — Encounter (HOSPITAL_COMMUNITY): Payer: Self-pay

## 2019-01-19 ENCOUNTER — Encounter (HOSPITAL_COMMUNITY): Payer: Self-pay

## 2019-01-22 ENCOUNTER — Encounter (HOSPITAL_COMMUNITY): Payer: Self-pay

## 2019-01-24 ENCOUNTER — Encounter (HOSPITAL_COMMUNITY): Payer: Self-pay

## 2019-01-26 ENCOUNTER — Encounter (HOSPITAL_COMMUNITY): Payer: Self-pay

## 2019-01-29 ENCOUNTER — Encounter (HOSPITAL_COMMUNITY): Payer: Self-pay

## 2019-01-31 ENCOUNTER — Encounter (HOSPITAL_COMMUNITY): Payer: Self-pay

## 2019-02-02 ENCOUNTER — Encounter (HOSPITAL_COMMUNITY): Payer: Self-pay

## 2019-02-05 ENCOUNTER — Encounter (HOSPITAL_COMMUNITY): Payer: Self-pay

## 2019-02-07 ENCOUNTER — Encounter (HOSPITAL_COMMUNITY): Payer: Self-pay

## 2019-02-09 ENCOUNTER — Encounter (HOSPITAL_COMMUNITY): Payer: Self-pay

## 2019-02-12 ENCOUNTER — Encounter (HOSPITAL_COMMUNITY): Payer: Self-pay

## 2019-02-14 ENCOUNTER — Encounter (HOSPITAL_COMMUNITY): Payer: Self-pay

## 2019-02-16 ENCOUNTER — Encounter (HOSPITAL_COMMUNITY): Payer: Self-pay

## 2019-02-19 ENCOUNTER — Encounter (HOSPITAL_COMMUNITY): Payer: Self-pay

## 2019-02-21 ENCOUNTER — Encounter (HOSPITAL_COMMUNITY): Payer: Self-pay

## 2019-02-23 ENCOUNTER — Encounter (HOSPITAL_COMMUNITY): Payer: Self-pay

## 2019-02-26 ENCOUNTER — Encounter (HOSPITAL_COMMUNITY): Payer: Self-pay

## 2019-02-28 ENCOUNTER — Encounter (HOSPITAL_COMMUNITY): Payer: Self-pay

## 2019-03-02 ENCOUNTER — Encounter (HOSPITAL_BASED_OUTPATIENT_CLINIC_OR_DEPARTMENT_OTHER): Payer: BC Managed Care – PPO

## 2019-03-02 ENCOUNTER — Encounter (HOSPITAL_COMMUNITY): Payer: Self-pay

## 2019-03-05 ENCOUNTER — Encounter (HOSPITAL_COMMUNITY): Payer: Self-pay

## 2019-03-07 ENCOUNTER — Encounter (HOSPITAL_COMMUNITY): Payer: Self-pay

## 2019-03-09 ENCOUNTER — Encounter (HOSPITAL_COMMUNITY): Payer: Self-pay

## 2019-03-12 ENCOUNTER — Encounter (HOSPITAL_COMMUNITY): Payer: Self-pay

## 2019-03-14 ENCOUNTER — Encounter (HOSPITAL_COMMUNITY): Payer: Self-pay

## 2019-03-15 ENCOUNTER — Other Ambulatory Visit: Payer: Self-pay | Admitting: Interventional Cardiology

## 2019-03-16 ENCOUNTER — Encounter (HOSPITAL_COMMUNITY): Payer: Self-pay

## 2019-03-19 ENCOUNTER — Encounter (HOSPITAL_COMMUNITY): Payer: Self-pay

## 2019-03-21 ENCOUNTER — Encounter (HOSPITAL_COMMUNITY): Payer: Self-pay

## 2019-03-23 ENCOUNTER — Encounter (HOSPITAL_COMMUNITY): Payer: Self-pay

## 2019-03-26 ENCOUNTER — Encounter (HOSPITAL_COMMUNITY): Payer: Self-pay

## 2019-03-28 ENCOUNTER — Encounter (HOSPITAL_COMMUNITY): Payer: Self-pay

## 2019-03-30 ENCOUNTER — Encounter (HOSPITAL_COMMUNITY): Payer: Self-pay

## 2019-04-02 ENCOUNTER — Encounter (HOSPITAL_COMMUNITY): Payer: Self-pay

## 2019-04-04 ENCOUNTER — Encounter (HOSPITAL_COMMUNITY): Payer: Self-pay

## 2019-04-06 ENCOUNTER — Encounter (HOSPITAL_COMMUNITY): Payer: Self-pay

## 2019-04-11 ENCOUNTER — Encounter (HOSPITAL_COMMUNITY): Payer: Self-pay

## 2019-04-12 ENCOUNTER — Ambulatory Visit: Payer: BC Managed Care – PPO | Admitting: Pulmonary Disease

## 2019-04-13 ENCOUNTER — Encounter (HOSPITAL_COMMUNITY): Payer: Self-pay

## 2019-04-16 ENCOUNTER — Encounter (HOSPITAL_COMMUNITY): Payer: Self-pay

## 2019-04-18 ENCOUNTER — Encounter (HOSPITAL_COMMUNITY): Payer: Self-pay

## 2019-04-18 ENCOUNTER — Other Ambulatory Visit: Payer: Self-pay | Admitting: Pulmonary Disease

## 2019-04-20 ENCOUNTER — Encounter (HOSPITAL_COMMUNITY): Payer: Self-pay

## 2019-04-23 ENCOUNTER — Encounter (HOSPITAL_COMMUNITY): Payer: Self-pay

## 2019-04-25 ENCOUNTER — Encounter (HOSPITAL_COMMUNITY): Payer: Self-pay

## 2019-04-27 ENCOUNTER — Encounter (HOSPITAL_COMMUNITY): Payer: Self-pay

## 2019-04-30 ENCOUNTER — Encounter (HOSPITAL_COMMUNITY): Payer: Self-pay

## 2019-05-02 ENCOUNTER — Encounter (HOSPITAL_COMMUNITY): Payer: Self-pay

## 2019-05-04 ENCOUNTER — Encounter (HOSPITAL_COMMUNITY): Payer: Self-pay

## 2019-05-04 ENCOUNTER — Encounter: Payer: Self-pay | Admitting: Gastroenterology

## 2019-05-04 ENCOUNTER — Telehealth: Payer: Self-pay | Admitting: Interventional Cardiology

## 2019-05-04 MED ORDER — REPATHA SURECLICK 140 MG/ML ~~LOC~~ SOAJ: 1.0000 "pen " | SUBCUTANEOUS | 11 refills | Status: DC

## 2019-05-04 NOTE — Telephone Encounter (Signed)
PA approved through 05/03/2020. [facility] made aware. New rx sent.

## 2019-05-04 NOTE — Telephone Encounter (Signed)
I submitted the PA a few min ago. Waiting on response. I will call once it results.

## 2019-05-04 NOTE — Telephone Encounter (Signed)
Follow Up:    Virgilio Belling from [facility], calling to see if Prior Authorization was received yesterday for pt's Repatha?

## 2019-05-07 ENCOUNTER — Encounter (HOSPITAL_COMMUNITY): Payer: Self-pay

## 2019-05-09 ENCOUNTER — Encounter (HOSPITAL_COMMUNITY): Payer: Self-pay

## 2019-05-11 ENCOUNTER — Encounter (HOSPITAL_COMMUNITY): Payer: Self-pay

## 2019-05-14 ENCOUNTER — Other Ambulatory Visit: Payer: Self-pay | Admitting: Interventional Cardiology

## 2019-05-14 ENCOUNTER — Encounter (HOSPITAL_COMMUNITY): Payer: Self-pay

## 2019-05-14 MED ORDER — LOSARTAN POTASSIUM 100 MG PO TABS: 100.0000 mg | ORAL_TABLET | Freq: Every day | ORAL | 2 refills | Status: DC

## 2019-06-01 ENCOUNTER — Other Ambulatory Visit: Payer: Self-pay | Admitting: Pulmonary Disease

## 2019-06-01 DIAGNOSIS — F419 Anxiety disorder, unspecified: Secondary | ICD-10-CM

## 2019-06-01 NOTE — Telephone Encounter (Signed)
Dr. Loanne Drilling, please advise if it is okay to refill med for pt. Thanks!

## 2019-06-13 ENCOUNTER — Ambulatory Visit: Payer: BC Managed Care – PPO | Admitting: Pulmonary Disease

## 2019-06-13 ENCOUNTER — Other Ambulatory Visit: Payer: Self-pay

## 2019-06-13 ENCOUNTER — Encounter: Payer: Self-pay | Admitting: Pulmonary Disease

## 2019-06-13 VITALS — BP 142/76 | HR 82 | Temp 98.3°F | Ht 62.0 in | Wt 170.0 lb

## 2019-06-13 DIAGNOSIS — G4733 Obstructive sleep apnea (adult) (pediatric): Secondary | ICD-10-CM | POA: Diagnosis not present

## 2019-06-13 DIAGNOSIS — F419 Anxiety disorder, unspecified: Secondary | ICD-10-CM

## 2019-06-13 DIAGNOSIS — J42 Unspecified chronic bronchitis: Secondary | ICD-10-CM

## 2019-06-13 MED ORDER — ONDANSETRON 4 MG PO TBDP: 4.0000 mg | ORAL_TABLET | Freq: Three times a day (TID) | ORAL | 1 refills | Status: DC | PRN

## 2019-06-13 MED ORDER — ALPRAZOLAM 0.5 MG PO TABS: ORAL_TABLET | ORAL | 1 refills | Status: DC

## 2019-06-13 NOTE — Progress Notes (Signed)
Subjective:   PATIENT ID: Jocelyn [name] GENDER: female DOB: [birth year], MRN: [id number]   HPI  Chief Complaint  Patient presents with  . Follow-up   Jocelyn [name] is a 63 year old female with chronic bronchitis, hx of sarcoid diagnosed via bronchoscopy in 1980s with recurrence, OSA, HTN, CAD s/p DES to RCA in 0045, chronic diastolic heart failure and polymyositis previously on methotrexate and anxiety who presents for follow-up of shortness of breath.  [name] patient: Followed for annual episodes of acute bronchitis treated with antibiotics, inhalers and steroids. She has chronic dyspnea on exertion and is sedentary at baseline. Of note, during hospitalization 08/2019 found with left paramediastinal mass. Repeat imaging demonstrated complete resolution after initiating bronchodilator therapy so this abnormality likely represented atelectasis.  Interim Since our last visit in 12/2018, her bronchodilator was changed to Advair. She does not feel any different, no worse or better. She reports her shortness of breath is minimal but states she limits her activity so not sure if she is doing well. Also ordered sleep study however not completed due to pandemic. Has not had to use her albuterol. She continues to report excessive daytime sleepiness and fatigue. Denies falling asleep when talking to people however will sleep easily if she is left alone and/or laying down. Her sleep hours are irregular and does not have a routine bed ritual. Considering returning to her part time job for limited hours at [employer].    Epworth: 9  Social History: Never smoker. 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  I have personally reviewed patient's past medical/family/social history, allergies, current medications.  Past Medical History:  Diagnosis Date  . Acute  bronchitis   . Allergic rhinitis, cause unspecified   . Anemia   . Anxiety   . B12 deficiency   . BV (bacterial vaginosis) 06/22/1996  . Calculus of kidney   . Calculus of kidney   . Coronary atherosclerosis of unspecified type of vessel, native or graft   . Dizziness   . Essential hypertension, benign   . Fibroid 2003  . Fibromyalgia   . H/O dysmenorrhea 2008  . H/O varicella   . Headache(784.0)    frequently  . HSV-2 infection 2009  . Hyperplastic colon polyp 05/16/2014  . Irritable bowel syndrome   . Meniere's disease, unspecified   . Menses, irregular 2003  . Myalgia and myositis, unspecified   . Obstructive sleep apnea (adult) (pediatric)   . Perimenopausal symptoms 2003  . Pure hypercholesterolemia   . Sarcoidosis   . Type II or unspecified type diabetes mellitus without mention of complication, not stated as uncontrolled   . Unspecified venous (peripheral) insufficiency   . Vitamin D deficiency   . Vulvitis 2010  . Yeast infection      Family History  Adopted: Yes  Problem Relation Age of Onset  . Other Other        ADOPTED  . Colon cancer Neg Hx   . Esophageal cancer Neg Hx   . Rectal cancer Neg Hx   . Stomach cancer Neg Hx      Social History   Occupational History  . Not on file  Tobacco Use  . Smoking status: Never Smoker  . Smokeless tobacco: Never Used  . Tobacco comment: Daily Caffeine - 1  Exercise 2-3 times/weekly  Substance and Sexual Activity  .  Alcohol use: No    Alcohol/week: 0.0 standard drinks  . Drug use: No  . Sexual activity: Yes    Birth control/protection: None    Allergies  Allergen Reactions  . Actos [Pioglitazone] Other (See Comments)    REACTION: pt states INTOL w/ edema  . Codeine Other (See Comments)    REACTION: vomiting  . Januvia [Sitagliptin] Nausea Only  . Lactose Intolerance (Gi) Diarrhea  . Morphine Nausea Only and Nausea And Vomiting    REACTION: vomiting REACTION: vomiting     Outpatient Medications Prior  to Visit  Medication Sig Dispense Refill  . albuterol (PROAIR HFA) 108 (90 Base) MCG/ACT inhaler Inhale 1-2 puffs into the lungs every 6 (six) hours as needed for wheezing or shortness of breath. 3 Inhaler 3  . aspirin 81 MG chewable tablet Chew 1 tablet (81 mg total) by mouth daily.    [name] diltiazem (CARDIZEM CD) 240 MG 24 hr capsule TAKE 1 CAPSULE(240 MG) BY MOUTH DAILY 90 capsule 3  . Empagliflozin-metFORMIN HCl ER (SYNJARDY XR) 25-1000 MG TB24 Take 1 tablet by mouth daily.    . Evolocumab (REPATHA SURECLICK) 841 MG/ML SOAJ Inject 1 pen into the skin every 14 (fourteen) days. 2 pen 11  . Fluticasone-Salmeterol (ADVAIR DISKUS) 100-50 MCG/DOSE AEPB Inhale 2 puffs into the lungs 2 (two) times daily. 60 each 3  . HP ACTHAR 80 UNIT/ML injectable gel Inject 80 Units into the skin 3 (three) times a week.     Vanessa Kick Ethyl (VASCEPA) 1 g CAPS Take 2 capsules (2 g total) by mouth 2 (two) times daily. 120 capsule 11  . losartan (COZAAR) 100 MG tablet Take 1 tablet (100 mg total) by mouth daily. 90 tablet 2  . metoprolol tartrate (LOPRESSOR) 50 MG tablet TAKE 1 TABLET BY MOUTH TWICE A DAY 60 tablet 9  . nitroGLYCERIN (NITROSTAT) 0.4 MG SL tablet Place 1 tablet (0.4 mg total) under the tongue every 5 (five) minutes as needed for chest pain. 25 tablet 2  . pantoprazole (PROTONIX) 40 MG tablet Take 1 tablet (40 mg total) by mouth daily. 90 tablet 3  . traMADol (ULTRAM) 50 MG tablet Take 1 tablet (50 mg total) by mouth 3 (three) times daily as needed. 90 tablet 0  . ALPRAZolam (XANAX) 0.5 MG tablet TAKE 1/2 TABLET IN THE MORNING AND AFTERNOON, AND 1 TABLET AT BEDTIME 60 tablet 1  . ondansetron (ZOFRAN-ODT) 4 MG disintegrating tablet Take 4 mg by mouth every 8 (eight) hours as needed for nausea or vomiting.    . furosemide (LASIX) 20 MG tablet Take 20 mg by mouth daily as needed for fluid or edema.    . valACYclovir (VALTREX) 500 MG tablet Take 500 mg by mouth daily.      No facility-administered medications  prior to visit.     Review of Systems  Constitutional: Positive for malaise/fatigue. Negative for chills, diaphoresis, fever and weight loss.  HENT: Negative for congestion, ear pain and sore throat.   Respiratory: Positive for shortness of breath. Negative for cough, hemoptysis, sputum production and wheezing.   Cardiovascular: Negative for chest pain, palpitations and leg swelling.  Gastrointestinal: Negative for abdominal pain, heartburn and nausea.  Genitourinary: Negative for frequency.  Musculoskeletal: Negative for joint pain and myalgias.  Skin: Negative for itching and rash.  Neurological: Negative for dizziness, weakness and headaches.  Endo/Heme/Allergies: Does not bruise/bleed easily.  Psychiatric/Behavioral: Negative for depression. The patient is nervous/anxious.     Objective:   Vitals:  06/13/19 1121  BP: (!) 142/76  Pulse: 82  Temp: 98.3 F (36.8 C)  TempSrc: Oral  SpO2: 99%  Weight: 170 lb (77.1 kg)  Height: 5\' 2"  (1.575 m)   SpO2: 99 % O2 Device: None (Room air)  Physical Exam: General: Well-appearing, no acute distress HENT: [place], AT Eyes: EOMI, no scleral icterus Respiratory: Clear to auscultation bilaterally.  No crackles, wheezing or rales Cardiovascular: RRR, -M/R/G, no JVD GI: BS+, soft, nontender Extremities:-Edema,-tenderness Neuro: AAO x4, CNII-XII grossly intact Skin: Intact, no rashes or bruising Psych: Normal mood, normal affect  Chest imaging: CXR 10/23/18 - Resolved left pleural effusion. CT Chest 08/18/18- 3cm pleural mass on the medial left chest, LLL collapse CT chest 01/02/2019-paramediastinal consolidation in the left upper lung has resolved  PFT: 09/09/14- Borderline mild obstructive defect  Ratio 80 (LLN 80) FEV1 79% 08/16/18 - Poor quality study  Labs CBC 08/22/19 reviewed - Anemia and leukocytosis  I have personally reviewed patient's past medical/family/social history/allergies/current medications.    Assessment & Plan:    Discussion: 63 year old female who presents for follow-up for shortness of breath, excessive daytime sleepiness and fatigue. Concerned that deconditioning may be contributing to her symptoms. Will evaluate for uncontrolled sleep apnea.  Chronic Bronchitis Recommend trial off Advair. If tolerated, ok to hold Discussed COVID precautions including limiting exposures, social distancing and proper handwashing.  Mild OSA Sleep study in 2006. Epworth score 9 -Home sleep study ordered however insurance denied. Offer in-lab study for evaluation   Anxiety Refilled xanax. Discussed risk of oversedation  Follow-up in 3 months with Dr. Loanne Drilling  for flu vaccine and check-up  Hx sarcoid Diagnosed on bronchoscopy in 1980s without recurrence -No evidence of active disease  Health Maintenance Pneumovax 2006 Influenza 2019 Next visit. Administer Prevnar followed by Pneumovax  Orders Placed This Encounter  Procedures  . Home sleep test    Standing Status:   Future    Standing Expiration Date:   06/12/2020    Scheduling Instructions:     Sooner than later please    Order Specific Question:   Where should this test be performed:    Answer:   [facility] - Pulmonary   Meds ordered this encounter  Medications  . ALPRAZolam (XANAX) 0.5 MG tablet    Sig: TAKE 1/2 TABLET IN THE MORNING AND AFTERNOON, AND 1 TABLET AT BEDTIME    Dispense:  60 tablet    Refill:  1  . ondansetron (ZOFRAN-ODT) 4 MG disintegrating tablet    Sig: Take 1 tablet (4 mg total) by mouth every 8 (eight) hours as needed for nausea or vomiting.    Dispense:  20 tablet    Refill:  1    Return in about 3 months (around 09/13/2019).    Liandro Thelin Rodman Pickle, MD [facility] 06/20/2019 4:44 PM  Personal pager: [phone] If unanswered, please page CCM On-call: [phone]

## 2019-06-13 NOTE — Patient Instructions (Addendum)
Chronic Bronchitis Since you are doing better, try to stop Advair inhaler for 2 days. If you are doing ok, you can continue holding it.  Mild OSA We will order home sleep study  Anxiety Refill xanax and zofran  Follow-up in 3 months with Dr. Loanne Drilling  for flu vaccine and check-up

## 2019-06-22 ENCOUNTER — Telehealth: Payer: Self-pay | Admitting: *Deleted

## 2019-06-22 NOTE — Telephone Encounter (Signed)
The out of pocket cost for the HST is $341.

## 2019-06-22 NOTE — Telephone Encounter (Signed)
-----   Message from [place], MD sent at 06/21/2019 10:41 AM EDT ----- Regarding: RE: sleep study Please let patient know HST was denied by insurance and that Dr. Loanne Drilling still recommends sleep study in-lab. If she is agreeable, order split night. ----- Message ----- From: Amado Coe, RN Sent: 06/21/2019  10:21 AM EDT To: Margaretha Seeds, MD Subject: sleep study                                    Dr. Loanne Drilling,   I received paperwork on patient's insurance denying coverage for the HST and saying she needs to have the in lab study. What would you like to do moving forward?  Thank you  Ander Purpura

## 2019-06-22 NOTE — Telephone Encounter (Signed)
Spoke with patient she wants to know how much the HST would be out of pocket. Patient is also going to see how much it is going to cost her for the in lab sleep study after insurance coverage.   Will route to Kathlee Nations to let us know prices for out of pocket for HST for patient.   Patient will call back to let us know which one she is willing to have  Will route to self and JE as FYI and to keep follow up on

## 2019-09-07 ENCOUNTER — Other Ambulatory Visit: Payer: Self-pay | Admitting: Interventional Cardiology

## 2019-09-07 MED ORDER — METOPROLOL TARTRATE 50 MG PO TABS: 50.0000 mg | ORAL_TABLET | Freq: Two times a day (BID) | ORAL | 3 refills | Status: DC

## 2019-09-12 ENCOUNTER — Telehealth: Payer: Self-pay | Admitting: Pulmonary Disease

## 2019-09-12 NOTE — Telephone Encounter (Signed)
Spoke with pt. She has been scheduled for her flu shot on 09/14/2019 at 1530. Nothing further was needed.

## 2019-09-14 ENCOUNTER — Ambulatory Visit: Payer: BC Managed Care – PPO

## 2019-09-14 ENCOUNTER — Other Ambulatory Visit: Payer: Self-pay

## 2019-09-14 ENCOUNTER — Ambulatory Visit (INDEPENDENT_AMBULATORY_CARE_PROVIDER_SITE_OTHER): Payer: BC Managed Care – PPO

## 2019-09-14 DIAGNOSIS — Z23 Encounter for immunization: Secondary | ICD-10-CM

## 2019-09-24 ENCOUNTER — Telehealth: Payer: Self-pay | Admitting: Physician Assistant

## 2019-09-24 NOTE — Telephone Encounter (Addendum)
Had a recent spell of diarrhea. She states she is lactose intolerant and she had drank a milk shake. The diarrhea cause her to have raw and uncomfortable feeling at her rectum. She has tried Preparation H, Cortaid ointment and petroleum jelly without relief. She is hurting more now. She is uncomfortable to the point that she has taken tylenol. No blood noted on the tissue. Patient was last seen 4/19/ 2016 for hemorrhoids. She was a patient of Dr Delfin Edis. Her next colonoscopy is to be in 2022. Patient requests a female provider. Agrees to see Dr Thornton Park tomorrow for evaluation.

## 2019-09-25 ENCOUNTER — Encounter: Payer: Self-pay | Admitting: Gastroenterology

## 2019-09-25 ENCOUNTER — Ambulatory Visit: Payer: BC Managed Care – PPO | Admitting: Gastroenterology

## 2019-09-25 VITALS — BP 132/80 | HR 72 | Temp 96.0°F | Ht 62.0 in | Wt 171.0 lb

## 2019-09-25 DIAGNOSIS — K644 Residual hemorrhoidal skin tags: Secondary | ICD-10-CM | POA: Diagnosis not present

## 2019-09-25 DIAGNOSIS — K602 Anal fissure, unspecified: Secondary | ICD-10-CM

## 2019-09-25 NOTE — Patient Instructions (Addendum)
Add a daily stool bulking agent such as metamucil to keep your stools soft Sitz baths two to three times daily for pain relief High fiber diet - increasing both dietary fiber (35 grams daily) and water intake  Nitroglycerin 0.125% gel applied to the rectum TID x 6-8 weeks Please call me next week if things aren't better Colonoscopy in 2022 Continue to use Lactaid as needed for lactose intolerance

## 2019-09-25 NOTE — Progress Notes (Signed)
Referring Provider: No ref. provider found Primary Care Physician:  Patient, No Pcp Per  Reason for Consultation:  Perirectal pain   IMPRESSION:  Posterior fissure exam today History of colon polyps    -One tubular adenoma and one mucosal prolapse polyp on colonoscopy 2015    -Dr. Maurene Capes recommended surveillance in 5 years    -Dr. Nyoka Cowden changed the recommendation for surveillance to 7 years based on new MSFT guidelines Internal hemorrhoids Lactose intolerance No known family history of colon cancer or polyps   Posterior fissure seen on examination today.     PLAN: Add a daily stool bulking agent such as metamucil Continue to use [employer]	[place] as needed Sitz baths two to three times daily for pain relief High fiber diet - increasing both dietary fiber (35 grams daily) and water intake  Citrucel or Metamucil daily, add Miralax 17 g daily if needed to keep stools soft Nitroglycerin 0.125% gel applied to the rectum TID x 6-8 weeks Please call me next week if symptoms aren't improving Colonoscopy in 2022 Continue to use Lactaid   HPI: Jocelyn [name] is a 63 y.o. female self-referred for further evaluation of rectal pain.  She was previously under the care of Dr. Olevia Perches,  last seen in 2016.  She has a history of obesity, diabetes, hypertension, hyperlipidemia, coronary artery disease, and sarcoidosis.  She had an upper endoscopy and colonoscopy in July 2015.  At the time of colonoscopy 2 polyps were removed, one was a tubular adenoma.  The other was a hyperplastic polyp.  She had a hyperplastic gastric polyp as well. She was seen for hemorrhoids in 2016.  Had a milkshake over the weekend and despite her Lactaid she developed severe diarrhea. Now with perianal irritation. She is concerned that a painful bump or area of swelling developed. The diarrhea has resolved, but, the bump is still there.  No blood or mucous. No constipation or straining. No foreign body or trauma. No fevers,  chills, or warmth at the anus.  Using Preparation H and Witch Hazel without improvement.  No other associated symptoms. No identified exacerbating or relieving features.   Two poyps in 2015: tubular adenoma, mucosal prolapse polyp Dr. Hilda Lias has previously reviewed her records and recommended surveillance colonoscopy 2022 earlier this year.   No known family history of colon cancer or polyps. No family history of uterine/endometrial cancer, pancreatic cancer or gastric/stomach cancer.   Past Medical History:  Diagnosis Date  . Acute bronchitis   . Allergic rhinitis, cause unspecified   . Anemia   . Anxiety   . B12 deficiency   . BV (bacterial vaginosis) 06/22/1996  . Calculus of kidney   . Calculus of kidney   . Coronary atherosclerosis of unspecified type of vessel, native or graft   . Dizziness   . Essential hypertension, benign   . Fibroid 2003  . Fibromyalgia   . H/O dysmenorrhea 2008  . H/O varicella   . Headache(784.0)    frequently  . HSV-2 infection 2009  . Hyperplastic colon polyp 05/16/2014  . Irritable bowel syndrome   . Meniere's disease, unspecified   . Menses, irregular 2003  . Myalgia and myositis, unspecified   . Obstructive sleep apnea (adult) (pediatric)   . Perimenopausal symptoms 2003  . Pure hypercholesterolemia   . Sarcoidosis   . Type II or unspecified type diabetes mellitus without mention of complication, not stated as uncontrolled   . Unspecified venous (peripheral) insufficiency   . Vitamin D deficiency   .  Vulvitis 2010  . Yeast infection     Past Surgical History:  Procedure Laterality Date  . COLONOSCOPY    . CORONARY STENT INTERVENTION N/A 06/15/2017   Procedure: CORONARY STENT INTERVENTION;  Surgeon: Jettie Booze, MD;  Location: [facility];  Service: Cardiovascular;  Laterality: N/A;  . heart catherization    . LEFT HEART CATH AND CORONARY ANGIOGRAPHY N/A 06/15/2017   Procedure: LEFT HEART CATH AND CORONARY ANGIOGRAPHY;   Surgeon: Jettie Booze, MD;  Location: [facility];  Service: Cardiovascular;  Laterality: N/A;  . TONSILLECTOMY      Current Outpatient Medications  Medication Sig Dispense Refill  . albuterol (PROAIR HFA) 108 (90 Base) MCG/ACT inhaler Inhale 1-2 puffs into the lungs every 6 (six) hours as needed for wheezing or shortness of breath. 3 Inhaler 3  . ALPRAZolam (XANAX) 0.5 MG tablet TAKE 1/2 TABLET IN THE MORNING AND AFTERNOON, AND 1 TABLET AT BEDTIME 60 tablet 1  . aspirin 81 MG chewable tablet Chew 1 tablet (81 mg total) by mouth daily.    [name] diltiazem (CARDIZEM CD) 240 MG 24 hr capsule TAKE 1 CAPSULE(240 MG) BY MOUTH DAILY 90 capsule 3  . Empagliflozin-metFORMIN HCl ER (SYNJARDY XR) 25-1000 MG TB24 Take 1 tablet by mouth daily.    . Evolocumab (REPATHA SURECLICK) XX123456 MG/ML SOAJ Inject 1 pen into the skin every 14 (fourteen) days. 2 pen 11  . Fluticasone-Salmeterol (ADVAIR DISKUS) 100-50 MCG/DOSE AEPB Inhale 2 puffs into the lungs 2 (two) times daily. 60 each 3  . furosemide (LASIX) 20 MG tablet Take 20 mg by mouth daily as needed for fluid or edema.    [name] HP ACTHAR 80 UNIT/ML injectable gel Inject 80 Units into the skin 3 (three) times a week.     Vanessa Kick Ethyl (VASCEPA) 1 g CAPS Take 2 capsules (2 g total) by mouth 2 (two) times daily. 120 capsule 11  . losartan (COZAAR) 100 MG tablet Take 1 tablet (100 mg total) by mouth daily. 90 tablet 2  . metoprolol tartrate (LOPRESSOR) 50 MG tablet Take 1 tablet (50 mg total) by mouth 2 (two) times daily. 60 tablet 3  . nitroGLYCERIN (NITROSTAT) 0.4 MG SL tablet Place 1 tablet (0.4 mg total) under the tongue every 5 (five) minutes as needed for chest pain. 25 tablet 2  . ondansetron (ZOFRAN-ODT) 4 MG disintegrating tablet Take 1 tablet (4 mg total) by mouth every 8 (eight) hours as needed for nausea or vomiting. 20 tablet 1  . pantoprazole (PROTONIX) 40 MG tablet Take 1 tablet (40 mg total) by mouth daily. 90 tablet 3  . traMADol (ULTRAM)  50 MG tablet Take 1 tablet (50 mg total) by mouth 3 (three) times daily as needed. 90 tablet 0  . valACYclovir (VALTREX) 500 MG tablet Take 500 mg by mouth daily.      No current facility-administered medications for this visit.     Allergies as of 09/25/2019 - Review Complete 09/25/2019  Allergen Reaction Noted  . Actos [pioglitazone] Other (See Comments)   . Codeine Other (See Comments)   . Januvia [sitagliptin] Nausea Only 04/12/2014  . Lactose intolerance (gi) Diarrhea 05/16/2014  . Morphine Nausea Only and Nausea And Vomiting     Family History  Adopted: Yes  Problem Relation Age of Onset  . Other Other        ADOPTED  . Colon cancer Neg Hx   . Esophageal cancer Neg Hx   . Rectal cancer Neg  Hx   . Stomach cancer Neg Hx     Social History   Socioeconomic History  . Marital status: Single    Spouse name: Not on file  . Number of children: 1  . Years of education: Not on file  . Highest education level: Not on file  Occupational History  . Occupation: Retired Management consultant  . Financial resource strain: Not on file  . Food insecurity    Worry: Not on file    Inability: Not on file  . Transportation needs    Medical: Not on file    Non-medical: Not on file  Tobacco Use  . Smoking status: Never Smoker  . Smokeless tobacco: Never Used  . Tobacco comment: Daily Caffeine - 1  Exercise 2-3 times/weekly  Substance and Sexual Activity  . Alcohol use: No    Alcohol/week: 0.0 standard drinks  . Drug use: No  . Sexual activity: Yes    Birth control/protection: None  Lifestyle  . Physical activity    Days per week: Not on file    Minutes per session: Not on file  . Stress: Not on file  Relationships  . Social Herbalist on phone: Not on file    Gets together: Not on file    Attends religious service: Not on file    Active member of club or organization: Not on file    Attends meetings of clubs or organizations: Not on file    Relationship  status: Not on file  . Intimate partner violence    Fear of current or ex partner: Not on file    Emotionally abused: Not on file    Physically abused: Not on file    Forced sexual activity: Not on file  Other Topics Concern  . Not on file  Social History Narrative   Exercises 2-3 times weekly   Caffeine Use: 1 daily (tea or Pepsi)   Lives alone in a one story home.  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      Review of Systems: 12 system ROS is negative except as noted above except for back pain.   Physical Exam: General:   Alert,  well-nourished, pleasant and cooperative in NAD Head:  Normocephalic and atraumatic. Eyes:  Sclera clear, no icterus.   Conjunctiva pink. Ears:  Normal auditory acuity. Nose:  No deformity, discharge,  or lesions. Mouth:  No deformity or lesions.   Neck:  Supple; no masses or thyromegaly. Lungs:  Clear throughout to auscultation.   No wheezes. Heart:  Regular rate and rhythm; no murmurs. Abdomen:  Soft,nontender, nondistended, normal bowel sounds, no rebound or guarding. No hepatosplenomegaly.   Rectal:   No chemical dermatitis. Small, non-thrombosed external hemorrhoids. A non-bleeding posterior fissure is present. No fistula. No prolapsing hemorrhoids. No rectal prolapse. Normal anocutaneous reflex. No stool in the rectal vault. No mass or fecal impaction. Normal anal resting tone.  Chaperone: Rovonda Msk:  Symmetrical. No boney deformities LAD: No inguinal or umbilical LAD Extremities:  No clubbing or edema. Neurologic:  Alert and  oriented x4;  grossly nonfocal Skin:  Intact without significant lesions or rashes. Psych:  Alert and cooperative. Normal mood and affect.     Ashford Clouse L. Tarri Glenn, MD, MPH 09/25/2019, 2:26 PM

## 2019-10-18 ENCOUNTER — Ambulatory Visit: Payer: BC Managed Care – PPO | Admitting: Interventional Cardiology

## 2019-10-19 ENCOUNTER — Other Ambulatory Visit: Payer: Self-pay

## 2019-10-19 ENCOUNTER — Encounter: Payer: Self-pay | Admitting: Interventional Cardiology

## 2019-10-19 ENCOUNTER — Ambulatory Visit (INDEPENDENT_AMBULATORY_CARE_PROVIDER_SITE_OTHER): Payer: Medicare Other | Admitting: Interventional Cardiology

## 2019-10-19 VITALS — BP 160/84 | HR 66 | Ht 62.0 in | Wt 173.8 lb

## 2019-10-19 DIAGNOSIS — I25118 Atherosclerotic heart disease of native coronary artery with other forms of angina pectoris: Secondary | ICD-10-CM | POA: Diagnosis not present

## 2019-10-19 DIAGNOSIS — E782 Mixed hyperlipidemia: Secondary | ICD-10-CM | POA: Diagnosis not present

## 2019-10-19 DIAGNOSIS — I252 Old myocardial infarction: Secondary | ICD-10-CM | POA: Diagnosis not present

## 2019-10-19 DIAGNOSIS — E119 Type 2 diabetes mellitus without complications: Secondary | ICD-10-CM | POA: Diagnosis not present

## 2019-10-19 MED ORDER — CLOPIDOGREL BISULFATE 75 MG PO TABS: 75.0000 mg | ORAL_TABLET | Freq: Every day | ORAL | 3 refills | Status: DC

## 2019-10-19 NOTE — Patient Instructions (Signed)
Medication Instructions:  Your physician has recommended you make the following change in your medication:   1. STOP: Aspirin  2. START: clopidogrel (plavix) 75 mg tablet: Take 1 tablet by mouth once a day  *If you need a refill on your cardiac medications before your next appointment, please call your pharmacy*  Lab Work: None ordered  If you have labs (blood work) drawn today and your tests are completely normal, you will receive your results only by: [name] MyChart Message (if you have MyChart) OR . A paper copy in the mail If you have any lab test that is abnormal or we need to change your treatment, we will call you to review the results.  Testing/Procedures: None ordered  Follow-Up: At [facility], you and your health needs are our priority.  As part of our continuing mission to provide you with exceptional heart care, we have created designated Provider Care Teams.  These Care Teams include your primary Cardiologist (physician) and Advanced Practice Providers (APPs -  Physician Assistants and Nurse Practitioners) who all work together to provide you with the care you need, when you need it.  Your next appointment:   6 month(s)  The format for your next appointment:   In Person  Provider:   You may see Larae Grooms, MD or one of the following Advanced Practice Providers on your designated Care Team:    Melina Copa, PA-C  Ermalinda Barrios, PA-C   Other Instructions Your physician discussed the importance of regular exercise and recommended that you start or continue a regular exercise program for good health. Your provider recommends that you maintain 150 minutes per week of moderate aerobic activity.  Your physician has requested that you regularly monitor and record your blood pressure readings at home. Please use the same machine at the same time of day to check your readings and record them. Let us know if your blood pressure is consistently greater than  140/90.

## 2019-10-19 NOTE — Progress Notes (Signed)
Cardiology Office Note   Date:  10/19/2019   ID:  Jocelyn [name], DOB [birth year], MRN [id number]  PCP:  Patient, No Pcp Per    No chief complaint on file.  CAD/MI  Wt Readings from Last 3 Encounters:  10/19/19 173 lb 12.8 oz (78.8 kg)  09/25/19 171 lb (77.6 kg)  06/13/19 170 lb (77.1 kg)       History of Present Illness: Jocelyn [name] is a 64 y.o. female  With CAD. Cath manyyears ago Showed branch vessel disease. She has had difficulty tolerating statins. Eventually, she was put on Repatha.  She had an inferior STEMI on 06/15/2017. She received a drug-eluting stent to her distal right coronary artery. She had mild LAD disease. Her diagonal vessel disease has persisted over the years. Of note, she had severe radial spasm and it was noted that she had subclavian artery lusoria variant which made right radial approach quite difficult. This anatomic abnormality had been noted on a prior CT scan done at [facility].She had arm pain, heartburn, back pain when she had her MI.   Echo in 8/18 showed: Left ventricle: The cavity size was normal. There was mild concentric hypertrophy. Systolic function was normal. The estimated ejection fraction was in the range of 50% to 55%. Wall motion was normal; there were no regional wall motion abnormalities. Doppler parameters are consistent with abnormal left ventricular relaxation (grade 1 diastolic dysfunction). - Aortic valve: There was no regurgitation. - Mitral valve: There was mild regurgitation. - Right ventricle: The cavity size was normal. Wall thickness was normal. Systolic function was normal. - Atrial septum: No defect or patent foramen ovale was identified by color flow Doppler. - Tricuspid valve: There was no regurgitation.  She has had issues with HTN and headaches.THis seems to have resolved.  She had a pneumonia and pleural effusion in 10/18. It was bloody. Her Brilinta was stopped at that time  for thoracentesis.  Denies : Chest pain. Dizziness. Leg edema.  Orthopnea. Palpitations. Paroxysmal nocturnal dyspnea. Shortness of breath. Syncope.   Rare Nitroglycerin use with emotional stress.   Knee pain limits her walking.  Exercise is absent because of this.   She tries to eat healthy.  Stays away from fast food.    Past Medical History:  Diagnosis Date  . Acute bronchitis   . Allergic rhinitis, cause unspecified   . Anemia   . Anxiety   . B12 deficiency   . BV (bacterial vaginosis) 06/22/1996  . Calculus of kidney   . Calculus of kidney   . Coronary atherosclerosis of unspecified type of vessel, native or graft   . Dizziness   . Essential hypertension, benign   . Fibroid 2003  . Fibromyalgia   . H/O dysmenorrhea 2008  . H/O varicella   . Headache(784.0)    frequently  . HSV-2 infection 2009  . Hyperplastic colon polyp 05/16/2014  . Irritable bowel syndrome   . Meniere's disease, unspecified   . Menses, irregular 2003  . Myalgia and myositis, unspecified   . Obstructive sleep apnea (adult) (pediatric)   . Perimenopausal symptoms 2003  . Pure hypercholesterolemia   . Sarcoidosis   . Type II or unspecified type diabetes mellitus without mention of complication, not stated as uncontrolled   . Unspecified venous (peripheral) insufficiency   . Vitamin D deficiency   . Vulvitis 2010  . Yeast infection     Past Surgical History:  Procedure Laterality Date  . COLONOSCOPY    .  CORONARY STENT INTERVENTION N/A 06/15/2017   Procedure: CORONARY STENT INTERVENTION;  Surgeon: Jettie Booze, MD;  Location: [facility];  Service: Cardiovascular;  Laterality: N/A;  . heart catherization    . LEFT HEART CATH AND CORONARY ANGIOGRAPHY N/A 06/15/2017   Procedure: LEFT HEART CATH AND CORONARY ANGIOGRAPHY;  Surgeon: Jettie Booze, MD;  Location: [facility];  Service: Cardiovascular;  Laterality: N/A;  . TONSILLECTOMY       Current Outpatient Medications   Medication Sig Dispense Refill  . ALPRAZolam (XANAX) 0.5 MG tablet TAKE 1/2 TABLET IN THE MORNING AND AFTERNOON, AND 1 TABLET AT BEDTIME 60 tablet 1  . aspirin 81 MG chewable tablet Chew 1 tablet (81 mg total) by mouth daily.    [name] diltiazem (CARDIZEM CD) 240 MG 24 hr capsule TAKE 1 CAPSULE(240 MG) BY MOUTH DAILY 90 capsule 3  . Empagliflozin-metFORMIN HCl ER (SYNJARDY XR) 25-1000 MG TB24 Take 1 tablet by mouth daily.    . Evolocumab (REPATHA SURECLICK) [id number] MG/ML SOAJ Inject 1 pen into the skin every 14 (fourteen) days. 2 pen 11  . HP ACTHAR 80 UNIT/ML injectable gel Inject 80 Units into the skin 3 (three) times a week.     Vanessa Kick Ethyl (VASCEPA) 1 g CAPS Take 2 capsules (2 g total) by mouth 2 (two) times daily. 120 capsule 11  . losartan (COZAAR) 100 MG tablet Take 1 tablet (100 mg total) by mouth daily. 90 tablet 2  . metoprolol tartrate (LOPRESSOR) 50 MG tablet Take 1 tablet (50 mg total) by mouth 2 (two) times daily. 60 tablet 3  . nitroGLYCERIN (NITROSTAT) 0.4 MG SL tablet Place 1 tablet (0.4 mg total) under the tongue every 5 (five) minutes as needed for chest pain. 25 tablet 2  . ondansetron (ZOFRAN-ODT) 4 MG disintegrating tablet Take 1 tablet (4 mg total) by mouth every 8 (eight) hours as needed for nausea or vomiting. 20 tablet 1  . pantoprazole (PROTONIX) 40 MG tablet Take 1 tablet (40 mg total) by mouth daily. 90 tablet 3  . traMADol (ULTRAM) 50 MG tablet Take 1 tablet (50 mg total) by mouth 3 (three) times daily as needed. 90 tablet 0  . valACYclovir (VALTREX) 500 MG tablet Take 500 mg by mouth daily.      No current facility-administered medications for this visit.     Allergies:   Actos [pioglitazone], Codeine, Januvia [sitagliptin], Lactose intolerance (gi), and Morphine    Social History:  The patient  reports that she has never smoked. She has never used smokeless tobacco. She reports that she does not drink alcohol or use drugs.   Family History:  The patient's family  history includes Other in an other family member. She was adopted.    ROS:  Please see the history of present illness.   Otherwise, review of systems are positive for knee pain.   All other systems are reviewed and negative.    PHYSICAL EXAM: VS:  BP (!) 160/84   Pulse 66   Ht 5\' 2"  (1.575 m)   Wt 173 lb 12.8 oz (78.8 kg)   SpO2 95%   BMI 31.79 kg/m  , BMI Body mass index is 31.79 kg/m. GEN: Well nourished, well developed, in no acute distress  HEENT: normal  Neck: no JVD, carotid bruits, or masses Cardiac: RRR; no murmurs, rubs, or gallops,no edema  Respiratory:  clear to auscultation bilaterally, normal work of breathing GI: soft, nontender, nondistended, + BS MS: no deformity  or atrophy  Skin: warm and dry, no rash Neuro:  Strength and sensation are intact Psych: euthymic mood, full affect   EKG:   The ekg ordered today demonstrates NSR, inferior Q waves.    Recent Labs: No results found for requested labs within last 8760 hours.   Lipid Panel    Component Value Date/Time   CHOL 183 12/29/2018 1026   TRIG 191 (H) 12/29/2018 1026   HDL 57 12/29/2018 1026   CHOLHDL 3.2 12/29/2018 1026   CHOLHDL 4 05/30/2018 1444   VLDL 36.2 05/30/2018 1444   LDLCALC 88 12/29/2018 1026   LDLDIRECT 91.6 08/14/2014 0822     Other studies Reviewed: Additional studies/ records that were reviewed today with results demonstrating: .   ASSESSMENT AND PLAN:  1. CAD/Old MI: No angina.  Continue aggressive secondary prevention.  Needs more exercise. Will switch to clopidogrel monotherapy.  2. Hyperlipidemia: LDL 88.  3. DM: A1C 7.1 in 12/2018. 4. HTN: Check BP at home.  High in the office today.  SHe needs to increase her exercise.  Minimize salt intake. If home readings are increased, will have to add another BP agent, perhaps aldactone.    Current medicines are reviewed at length with the patient today.  The patient concerns regarding her medicines were addressed.  The following  changes have been made:  No change  Labs/ tests ordered today include:  No orders of the defined types were placed in this encounter.   Recommend 150 minutes/week of aerobic exercise Low fat, low carb, high fiber diet recommended  Disposition:   FU in 6 months   Signed, Larae Grooms, MD  10/19/2019 4:43 PM    [facility] [address], [place], [place]  [address] Phone: [phone]; Fax: [phone]

## 2019-10-24 ENCOUNTER — Other Ambulatory Visit: Payer: Self-pay

## 2019-10-24 ENCOUNTER — Ambulatory Visit: Payer: Self-pay

## 2019-10-24 ENCOUNTER — Encounter: Payer: Self-pay | Admitting: Orthopaedic Surgery

## 2019-10-24 ENCOUNTER — Ambulatory Visit: Payer: Medicare Other | Admitting: Orthopaedic Surgery

## 2019-10-24 DIAGNOSIS — M1712 Unilateral primary osteoarthritis, left knee: Secondary | ICD-10-CM | POA: Diagnosis not present

## 2019-10-24 DIAGNOSIS — M1711 Unilateral primary osteoarthritis, right knee: Secondary | ICD-10-CM

## 2019-10-24 MED ORDER — METHYLPREDNISOLONE ACETATE 40 MG/ML IJ SUSP
40.0000 mg | INTRAMUSCULAR | Status: AC | PRN
  Administered 2019-10-24: 17:00:00 40 mg via INTRA_ARTICULAR

## 2019-10-24 MED ORDER — MELOXICAM 7.5 MG PO TABS: 7.5000 mg | ORAL_TABLET | Freq: Two times a day (BID) | ORAL | 2 refills | Status: DC | PRN

## 2019-10-24 MED ORDER — LIDOCAINE HCL 1 % IJ SOLN
2.0000 mL | INTRAMUSCULAR | Status: AC | PRN
  Administered 2019-10-24: 2 mL

## 2019-10-24 MED ORDER — BUPIVACAINE HCL 0.5 % IJ SOLN
2.0000 mL | INTRAMUSCULAR | Status: AC | PRN
  Administered 2019-10-24: 2 mL via INTRA_ARTICULAR

## 2019-10-24 NOTE — Progress Notes (Signed)
Office Visit Note   Patient: Jocelyn [name]           Date of Birth: January 30, 1956           MRN: [id number] Visit Date: 10/24/2019              Requested by: No referring provider defined for this encounter. PCP: Patient, No Pcp Per   Assessment & Plan: Visit Diagnoses:  1. Primary osteoarthritis of right knee   2. Primary osteoarthritis of left knee     Plan: Impression is right greater than left knee DJD.  She does have an effusion of the right knee which was aspirated and injected with cortisone today.  We obtained approximately 20 cc of fluid.  Patient tolerated this well.  The daughter was updated as well.  Prescription for meloxicam was given today and patient was instructed to use very sparingly given history of cardiac disease.  Follow-Up Instructions: Return if symptoms worsen or fail to improve.   Orders:  Orders Placed This Encounter  Procedures  . Large Joint Inj: R knee  . XR Knee Complete 4 Views Right  . XR Knee Complete 4 Views Left  . Ambulatory referral to Physical Therapy   Meds ordered this encounter  Medications  . meloxicam (MOBIC) 7.5 MG tablet    Sig: Take 1 tablet (7.5 mg total) by mouth 2 (two) times daily as needed for pain.    Dispense:  30 tablet    Refill:  2      Procedures: Large Joint Inj: R knee on 10/24/2019 5:00 PM Indications: pain Details: 22 G needle  Arthrogram: No  Medications: 2 mL bupivacaine 0.5 %; 2 mL lidocaine 1 %; 40 mg methylPREDNISolone acetate 40 MG/ML Consent was given by the patient. Patient was prepped and draped in the usual sterile fashion.       Clinical Data: No additional findings.   Subjective: Chief Complaint  Patient presents with  . Right Knee - Pain  . Left Knee - Pain    Jocelyn [name] is a very pleasant 63 year old female comes in for evaluation of bilateral knee pain mainly at the right knee.  She fell during the summer and since then her right knee has been very painful when ambulating.  She  endorses a weak feeling in her knee and it feels like it wants to give out.  Denies any numbness or tingling or mechanical symptoms.  She states that she can live with the pain in her left knee.   Review of Systems  Constitutional: Negative.   HENT: Negative.   Eyes: Negative.   Respiratory: Negative.   Cardiovascular: Negative.   Endocrine: Negative.   Musculoskeletal: Negative.   Neurological: Negative.   Hematological: Negative.   Psychiatric/Behavioral: Negative.   All other systems reviewed and are negative.    Objective: Vital Signs: There were no vitals taken for this visit.  Physical Exam Vitals signs and nursing note reviewed.  Constitutional:      Appearance: She is well-developed.  HENT:     Head: Normocephalic and atraumatic.  Neck:     Musculoskeletal: Neck supple.  Pulmonary:     Effort: Pulmonary effort is normal.  Abdominal:     Palpations: Abdomen is soft.  Skin:    General: Skin is warm.     Capillary Refill: Capillary refill takes less than 2 seconds.  Neurological:     Mental Status: She is alert and oriented to person, place, and time.  Psychiatric:  Behavior: Behavior normal.        Thought Content: Thought content normal.        Judgment: Judgment normal.     Ortho Exam Right knee exam shows a small joint effusion.  Collaterals and cruciates are stable.  Patellofemoral crepitus.  Slightly restricted range of motion with mild pain.  Left knee exam shows no joint effusion.  Collaterals and cruciates are stable.  Normal range of motion. Specialty Comments:  No specialty comments available.  Imaging: Xr Knee Complete 4 Views Left  Result Date: 10/24/2019 Moderate tricompartmental DJD.  Xr Knee Complete 4 Views Right  Result Date: 10/24/2019 Moderately severe tricompartmental DJD with significant medial compartment joint space narrowing.    PMFS History: Patient Active Problem List   Diagnosis Date Noted  . Hx of pleural  effusion   . Dyspnea 08/16/2018  . Fall on or from sidewalk curb, initial encounter 05/31/2018  . History of acute inferior wall MI 08/22/2017  . Diabetes mellitus ([place])   . S/P right coronary artery (RCA) stent placement   . Overweight 05/05/2017  . Diabetes mellitus type 2, controlled, without complications ([place]) 99991111  . Asthmatic bronchitis , chronic ([place]) 05/10/2016  . Polymyositis ([place]) 09/15/2015  . History of sarcoidosis 09/15/2015  . Falling 03/13/2015  . Edema 02/13/2014  . Coronary artery disease involving native heart without angina pectoris 10/29/2013  . Hyperlipidemia 10/29/2013  . Iron (Fe) deficiency anemia 08/08/2013  . Vitamin B 12 deficiency 08/08/2013  . GERD (gastroesophageal reflux disease) 09/08/2011  . Weakness 02/22/2011  . Depression 02/22/2011  . [place] DISEASE 04/30/2009  . Vitamin D deficiency 02/05/2009  . Venous (peripheral) insufficiency 12/12/2007  . RENAL CALCULUS 12/12/2007  . DIZZINESS 12/12/2007  . HYPERCHOLESTEROLEMIA 09/16/2007  . Anxiety state 09/16/2007  . HYPERTENSION, BENIGN 09/16/2007  . Allergic rhinitis 09/16/2007  . IRRITABLE BOWEL SYNDROME 09/16/2007  . Myalgia and myositis 09/16/2007   Past Medical History:  Diagnosis Date  . Acute bronchitis   . Allergic rhinitis, cause unspecified   . Anemia   . Anxiety   . B12 deficiency   . BV (bacterial vaginosis) 06/22/1996  . Calculus of kidney   . Calculus of kidney   . Coronary atherosclerosis of unspecified type of vessel, native or graft   . Dizziness   . Essential hypertension, benign   . Fibroid 2003  . Fibromyalgia   . H/O dysmenorrhea 2008  . H/O varicella   . Headache(784.0)    frequently  . HSV-2 infection 2009  . Hyperplastic colon polyp 05/16/2014  . Irritable bowel syndrome   . Meniere's disease, unspecified   . Menses, irregular 2003  . Myalgia and myositis, unspecified   . Obstructive sleep apnea (adult) (pediatric)   . Perimenopausal symptoms 2003   . Pure hypercholesterolemia   . Sarcoidosis   . Type II or unspecified type diabetes mellitus without mention of complication, not stated as uncontrolled   . Unspecified venous (peripheral) insufficiency   . Vitamin D deficiency   . Vulvitis 2010  . Yeast infection     Family History  Adopted: Yes  Problem Relation Age of Onset  . Other Other        ADOPTED  . Colon cancer Neg Hx   . Esophageal cancer Neg Hx   . Rectal cancer Neg Hx   . Stomach cancer Neg Hx     Past Surgical History:  Procedure Laterality Date  . COLONOSCOPY    . CORONARY STENT INTERVENTION N/A 06/15/2017  Procedure: CORONARY STENT INTERVENTION;  Surgeon: Jettie Booze, MD;  Location: [facility];  Service: Cardiovascular;  Laterality: N/A;  . heart catherization    . LEFT HEART CATH AND CORONARY ANGIOGRAPHY N/A 06/15/2017   Procedure: LEFT HEART CATH AND CORONARY ANGIOGRAPHY;  Surgeon: Jettie Booze, MD;  Location: [facility];  Service: Cardiovascular;  Laterality: N/A;  . TONSILLECTOMY     Social History   Occupational History  . Occupation: Retired Pharmacist, hospital   Tobacco Use  . Smoking status: Never Smoker  . Smokeless tobacco: Never Used  . Tobacco comment: Daily Caffeine - 1  Exercise 2-3 times/weekly  Substance and Sexual Activity  . Alcohol use: No    Alcohol/week: 0.0 standard drinks  . Drug use: No  . Sexual activity: Yes    Birth control/protection: None

## 2019-10-26 ENCOUNTER — Telehealth: Payer: Self-pay | Admitting: Interventional Cardiology

## 2019-10-26 DIAGNOSIS — I10 Essential (primary) hypertension: Secondary | ICD-10-CM

## 2019-10-26 NOTE — Telephone Encounter (Signed)
Patient called saying she wants to speak with Dr.Varanasi's nurse in reference to her BP.

## 2019-10-29 NOTE — Telephone Encounter (Signed)
Called and spoke to patient. She states that her BP is still elevated (158/89, 160/91, 167/95, 153/85). HR 70-80s. Patient is taking her meds as prescribed. C/O of mild HA. Denies chest pain, lightheadedness, dizziness, syncope, changes in vision, speech, or strength, or any other Sx. Patient is avoiding salt in her diet and is trying to increase exercise, but this is limited d/t her knee pain. Will forward to Dr. Irish Lack to see if he would like to start spironolactone or another agent for BP control.

## 2019-10-29 NOTE — Telephone Encounter (Signed)
OK to start aldactone 25 mg daily.  BMet in 1 week.

## 2019-10-29 NOTE — Telephone Encounter (Signed)
Patient was calling back asking for Jocelyn [name], Dr. Hassell Done Nurse. Please call the patient

## 2019-10-30 MED ORDER — SPIRONOLACTONE 25 MG PO TABS: 25.0000 mg | ORAL_TABLET | Freq: Every day | ORAL | 3 refills | Status: DC

## 2019-10-30 NOTE — Telephone Encounter (Signed)
Called and made patient aware of Dr. Hassell Done recommendations to start spironolactone 25 mg QD and recheck BMET in 1 week. Rx sent to preferred pharmacy and lab appt made for 12/22. Instructed patient to continue to monitor BP. Patient verbalized understanding and thanked me for the call.

## 2019-11-06 ENCOUNTER — Other Ambulatory Visit: Payer: Medicare Other

## 2019-11-06 ENCOUNTER — Other Ambulatory Visit: Payer: Self-pay

## 2019-11-06 DIAGNOSIS — I10 Essential (primary) hypertension: Secondary | ICD-10-CM

## 2019-11-07 LAB — BASIC METABOLIC PANEL
BUN/Creatinine Ratio: 19 (ref 12–28)
BUN: 13 mg/dL (ref 8–27)
CO2: 24 mmol/L (ref 20–29)
Calcium: 9.8 mg/dL (ref 8.7–10.3)
Chloride: 105 mmol/L (ref 96–106)
Creatinine, Ser: 0.7 mg/dL (ref 0.57–1.00)
GFR calc Af Amer: 105 mL/min/{1.73_m2} (ref >59)
GFR calc non Af Amer: 91 mL/min/{1.73_m2} (ref >59)
Glucose: 87 mg/dL (ref 65–99)
Potassium: 4.5 mmol/L (ref 3.5–5.2)
Sodium: 146 mmol/L — ABNORMAL HIGH (ref 134–144)

## 2019-11-14 ENCOUNTER — Telehealth: Payer: Self-pay | Admitting: Pharmacist

## 2019-11-14 NOTE — Telephone Encounter (Signed)
Patient is now on medicare. New prior authorization was submitted to [facility]. However they denied it. Appeals letter has been faxed over. Called patient and let her know. Will contact patient once determination is made.

## 2019-11-22 NOTE — Telephone Encounter (Signed)
Appeals for [place] overturned. Approved through 05/14/2020 Patient made aware

## 2019-11-27 ENCOUNTER — Other Ambulatory Visit: Payer: Self-pay

## 2019-11-27 ENCOUNTER — Encounter: Payer: Self-pay | Admitting: Physical Therapy

## 2019-11-27 ENCOUNTER — Ambulatory Visit (INDEPENDENT_AMBULATORY_CARE_PROVIDER_SITE_OTHER): Payer: Medicare PPO | Admitting: Physical Therapy

## 2019-11-27 DIAGNOSIS — M25561 Pain in right knee: Secondary | ICD-10-CM | POA: Diagnosis not present

## 2019-11-27 DIAGNOSIS — G8929 Other chronic pain: Secondary | ICD-10-CM | POA: Diagnosis not present

## 2019-11-27 DIAGNOSIS — M25562 Pain in left knee: Secondary | ICD-10-CM | POA: Diagnosis not present

## 2019-11-27 DIAGNOSIS — M6281 Muscle weakness (generalized): Secondary | ICD-10-CM | POA: Diagnosis not present

## 2019-11-27 NOTE — Patient Instructions (Signed)
Access Code: [email or web address]  URL: [email or web address]  Date: 11/27/2019  Prepared by: Elsie Ra   Exercises  Supine Hamstring Stretch with Strap - 3 reps - 1 sets - 30 hold - 2x daily - 6x weekly  Supine Quadriceps Stretch with Strap on Table - 2 sets - 30 hold - 2x daily - 6x weekly  Supine Bridge - 10 reps - 1-2 sets - 5 hold - 2x daily - 6x weekly  Supine Active Straight Leg Raise - 10 reps - 1-2 sets - 2x daily - 6x weekly  Hooklying Clamshell with Resistance - 10 reps - 1-2 sets - 2x daily - 6x weekly  Sitting Knee Extension with Resistance - 10 reps - 1-2 sets - 2x daily - 6x weekly  Seated Hamstring Curls with Resistance - 10 reps - 1-2 sets - 2x daily - 6x weekly  Mini Squat with Counter Support - 10 reps - 1-2 sets - 2x daily - 6x weekly
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
## 2019-12-07 ENCOUNTER — Telehealth: Payer: Self-pay | Admitting: Pharmacist

## 2019-12-07 DIAGNOSIS — I252 Old myocardial infarction: Secondary | ICD-10-CM

## 2019-12-07 NOTE — Telephone Encounter (Signed)
Returned call to patient. New insurance as follows: ID: [id number] PCN: [id number]  Prior auth has been submitted and approved through 11/14/20.  Pt states copay will be $40/month under new insurance plan. This will be a bit pricy since she takes other brand name medications. Applied for [employer] which would help with cost of Repatha and Vascepa. Pt was conditionally approved but needs to provide proof of annual income. She will search for this and email it to me for submission to [place].

## 2019-12-07 NOTE — Telephone Encounter (Signed)
Patient calling to let Melissa know that she is now with [facility] for her Repatha.

## 2019-12-07 NOTE — Telephone Encounter (Addendum)
Income information has been received and faxed to [facility].

## 2019-12-10 ENCOUNTER — Other Ambulatory Visit: Payer: Self-pay | Admitting: *Deleted

## 2019-12-10 DIAGNOSIS — I10 Essential (primary) hypertension: Secondary | ICD-10-CM

## 2019-12-14 ENCOUNTER — Other Ambulatory Visit: Payer: Self-pay

## 2019-12-14 ENCOUNTER — Ambulatory Visit: Payer: Medicare PPO | Admitting: Physical Therapy

## 2019-12-14 DIAGNOSIS — M6281 Muscle weakness (generalized): Secondary | ICD-10-CM

## 2019-12-14 DIAGNOSIS — M25561 Pain in right knee: Secondary | ICD-10-CM

## 2019-12-14 DIAGNOSIS — M25562 Pain in left knee: Secondary | ICD-10-CM | POA: Diagnosis not present

## 2019-12-14 DIAGNOSIS — G8929 Other chronic pain: Secondary | ICD-10-CM

## 2019-12-14 NOTE — Therapy (Signed)
[facility], [place], [address] Phone: [phone]   Fax:  [phone]  Physical Therapy Treatment  Patient Details  Name: Jocelyn [name] MRN: [id number] Date of Birth: [birth year] Referring Provider (PT): Leandrew Koyanagi, MD   Encounter Date: 12/14/2019  PT End of Session - 12/14/19 1455    Visit Number  2    Number of Visits  8    Date for PT Re-Evaluation  01/08/20    Authorization Type  [employer] MCR    PT Start Time  1400    PT Stop Time  1445    PT Time Calculation (min)  45 min    Activity Tolerance  Patient tolerated treatment well    Behavior During Therapy  [facility] for tasks assessed/performed       Past Medical History:  Diagnosis Date  . Acute bronchitis   . Allergic rhinitis, cause unspecified   . Anemia   . Anxiety   . B12 deficiency   . BV (bacterial vaginosis) 06/22/1996  . Calculus of kidney   . Calculus of kidney   . Coronary atherosclerosis of unspecified type of vessel, native or graft   . Dizziness   . Essential hypertension, benign   . Fibroid 2003  . Fibromyalgia   . H/O dysmenorrhea 2008  . H/O varicella   . Headache(784.0)    frequently  . HSV-2 infection 2009  . Hyperplastic colon polyp 05/16/2014  . Irritable bowel syndrome   . Meniere's disease, unspecified   . Menses, irregular 2003  . Myalgia and myositis, unspecified   . Obstructive sleep apnea (adult) (pediatric)   . Perimenopausal symptoms 2003  . Pure hypercholesterolemia   . Sarcoidosis   . Type II or unspecified type diabetes mellitus without mention of complication, not stated as uncontrolled   . Unspecified venous (peripheral) insufficiency   . Vitamin D deficiency   . Vulvitis 2010  . Yeast infection     Past Surgical History:  Procedure Laterality Date  . COLONOSCOPY    . CORONARY STENT INTERVENTION N/A 06/15/2017   Procedure: CORONARY STENT INTERVENTION;  Surgeon: Jettie Booze, MD;  Location: [facility];  Service: Cardiovascular;  Laterality: N/A;  . heart catherization    . LEFT HEART CATH AND CORONARY ANGIOGRAPHY N/A 06/15/2017   Procedure: LEFT HEART CATH AND CORONARY ANGIOGRAPHY;  Surgeon: Jettie Booze, MD;  Location: [facility];  Service: Cardiovascular;  Laterality: N/A;  . TONSILLECTOMY      There were no vitals filed for this visit.  Subjective Assessment - 12/14/19 1410    Subjective  relays knee pain is now off and on. Has about 3/10 pain in her knees today    How long can you stand comfortably?  hour at most    How long can you walk comfortably?  30 min    Diagnostic tests  XR Bilat knee OA right greater than left knee.    Patient Stated Goals  be able to walk with less pain    Pain Onset  More than a month ago        [facility] Adult PT Treatment/Exercise - 12/14/19 0001      Exercises   Exercises  Knee/Hip      Knee/Hip Exercises: Stretches   Active Hamstring Stretch  2 reps;30 seconds;Both    Quad Stretch  Both;2 reps;30 seconds    Quad Stretch Limitations  supine with strap 30 sec X 2  Other Knee/Hip Stretches  heelslides AAROM 10 sec X 5 reps bilat      Knee/Hip Exercises: Aerobic   Recumbent Bike  6 min       Knee/Hip Exercises: Machines for Strengthening   Total Gym Leg Press  bilat leg press 37 lbs X 15 reps      Knee/Hip Exercises: Standing   Knee Flexion  Both;10 reps      Knee/Hip Exercises: Seated   Long Arc Quad  Both;15 reps      Knee/Hip Exercises: Supine   Short Arc Quad Sets  Both;10 reps    Hip Adduction Isometric  10 reps    Hip Adduction Isometric Limitations  5 sec hold    Bridges  10 reps    Straight Leg Raises  Both;10 reps      Modalities   Modalities  Cryotherapy;Electrical Stimulation      Cryotherapy   Number Minutes Cryotherapy  10 Minutes    Cryotherapy Location  Knee    Type of Cryotherapy  Ice pack      Electrical Stimulation   Electrical Stimulation Location  bilat knees    Electrical Stimulation  Action  premod    Electrical Stimulation Parameters  tolerance    Electrical Stimulation Goals  Pain                  PT Long Term Goals - 11/27/19 1425      PT LONG TERM GOAL #1   Title  Pt will be I and compliant with HEP. (Target for all goals 6 week 01/08/20)    Status  New      PT LONG TERM GOAL #2   Title  Pt will improve hip strength to 4+ and knee strength to 5/5 MMT in sitting to show improved strength for functional abilites.    Status  New      PT LONG TERM GOAL #3   Title  Pt will improve bilat knee AROM to >125 deg to improve function    Status  New      PT LONG TERM GOAL #4   Title  Pt willl report overall less than 3-4 pain with ususal activity.            Plan - 12/14/19 1456    Clinical Impression Statement  Session focused on bilat knee ROM and strengthening as tolerated without complaints. HEP was reviewed with her and she showed good return demonstration but may need further instruction. Continued with TENS and ice post tx which did appear to reduce pain/swelling. Continue POC    Personal Factors and Comorbidities  Time since onset of injury/illness/exacerbation;Comorbidity 1    Comorbidities  knee OA    Examination-Activity Limitations  Bend;Squat;Stairs;Stand;Lift;Locomotion Level    Examination-Participation Restrictions  Cleaning;Community Activity;Shop;Laundry    Stability/Clinical Decision Making  Evolving/Moderate complexity    Rehab Potential  Good    PT Frequency  2x / week   1-2   PT Treatment/Interventions  ADLs/Self Care Home Management;Aquatic Therapy;Cryotherapy;Electrical Stimulation;Iontophoresis 4mg /ml Dexamethasone;Moist Heat;Ultrasound;Stair training;Therapeutic activities;Therapeutic exercise;Balance training;Neuromuscular re-education;Manual techniques;Passive range of motion;Dry needling;Joint Manipulations;Taping    PT Next Visit Plan  review and update HEP PRN, needs hip/knee strength and progression of standing activity  as able    PT Home Exercise Plan  Access Code: [email or web address]    Consulted and Agree with Plan of Care  Patient       Patient will benefit from skilled therapeutic intervention in order to improve the following deficits and  impairments:  Abnormal gait, Decreased strength, Decreased range of motion, Difficulty walking, Impaired flexibility, Pain  Visit Diagnosis: Chronic pain of right knee  Chronic pain of left knee  Muscle weakness (generalized)     Problem List Patient Active Problem List   Diagnosis Date Noted  . Hx of pleural effusion   . Dyspnea 08/16/2018  . Fall on or from sidewalk curb, initial encounter 05/31/2018  . History of acute inferior wall MI 08/22/2017  . Diabetes mellitus ([place])   . S/P right coronary artery (RCA) stent placement   . Overweight 05/05/2017  . Diabetes mellitus type 2, controlled, without complications ([place]) 99991111  . Asthmatic bronchitis , chronic ([place]) 05/10/2016  . Polymyositis ([place]) 09/15/2015  . History of sarcoidosis 09/15/2015  . Falling 03/13/2015  . Edema 02/13/2014  . Coronary artery disease involving native heart without angina pectoris 10/29/2013  . Hyperlipidemia 10/29/2013  . Iron (Fe) deficiency anemia 08/08/2013  . Vitamin B 12 deficiency 08/08/2013  . GERD (gastroesophageal reflux disease) 09/08/2011  . Weakness 02/22/2011  . Depression 02/22/2011  . [place] DISEASE 04/30/2009  . Vitamin D deficiency 02/05/2009  . Venous (peripheral) insufficiency 12/12/2007  . RENAL CALCULUS 12/12/2007  . DIZZINESS 12/12/2007  . HYPERCHOLESTEROLEMIA 09/16/2007  . Anxiety state 09/16/2007  . HYPERTENSION, BENIGN 09/16/2007  . Allergic rhinitis 09/16/2007  . IRRITABLE BOWEL SYNDROME 09/16/2007  . Myalgia and myositis 09/16/2007    Silvestre Mesi 12/14/2019, 2:58 PM  [facility] [address] [place], [place], [address] Phone: [phone]   Fax:  [phone]  Name: Jocelyn [name] MRN: [id number] Date of Birth: 1956/06/02

## 2019-12-19 ENCOUNTER — Ambulatory Visit (INDEPENDENT_AMBULATORY_CARE_PROVIDER_SITE_OTHER): Payer: Medicare PPO | Admitting: Physical Therapy

## 2019-12-19 ENCOUNTER — Other Ambulatory Visit: Payer: Self-pay

## 2019-12-19 DIAGNOSIS — G8929 Other chronic pain: Secondary | ICD-10-CM

## 2019-12-19 DIAGNOSIS — M25561 Pain in right knee: Secondary | ICD-10-CM | POA: Diagnosis not present

## 2019-12-19 DIAGNOSIS — M25562 Pain in left knee: Secondary | ICD-10-CM | POA: Diagnosis not present

## 2019-12-19 DIAGNOSIS — M6281 Muscle weakness (generalized): Secondary | ICD-10-CM

## 2019-12-19 NOTE — Therapy (Signed)
[facility] [place], [place], [address] Phone: [phone]   Fax:  [phone]  Physical Therapy Treatment  Patient Details  Name: Jocelyn [name] MRN: [id number] Date of Birth: 1956/02/19 Referring Provider (PT): Leandrew Koyanagi, MD   Encounter Date: 12/19/2019  PT End of Session - 12/19/19 1409    Visit Number  3    Number of Visits  8    Date for PT Re-Evaluation  01/08/20    Authorization Type  [employer] MCR    PT Start Time  1326    PT Stop Time  1415    PT Time Calculation (min)  49 min    Activity Tolerance  Patient tolerated treatment well    Behavior During Therapy  [facility] for tasks assessed/performed       Past Medical History:  Diagnosis Date  . Acute bronchitis   . Allergic rhinitis, cause unspecified   . Anemia   . Anxiety   . B12 deficiency   . BV (bacterial vaginosis) 06/22/1996  . Calculus of kidney   . Calculus of kidney   . Coronary atherosclerosis of unspecified type of vessel, native or graft   . Dizziness   . Essential hypertension, benign   . Fibroid 2003  . Fibromyalgia   . H/O dysmenorrhea 2008  . H/O varicella   . Headache(784.0)    frequently  . HSV-2 infection 2009  . Hyperplastic colon polyp 05/16/2014  . Irritable bowel syndrome   . Meniere's disease, unspecified   . Menses, irregular 2003  . Myalgia and myositis, unspecified   . Obstructive sleep apnea (adult) (pediatric)   . Perimenopausal symptoms 2003  . Pure hypercholesterolemia   . Sarcoidosis   . Type II or unspecified type diabetes mellitus without mention of complication, not stated as uncontrolled   . Unspecified venous (peripheral) insufficiency   . Vitamin D deficiency   . Vulvitis 2010  . Yeast infection     Past Surgical History:  Procedure Laterality Date  . COLONOSCOPY    . CORONARY STENT INTERVENTION N/A 06/15/2017   Procedure: CORONARY STENT INTERVENTION;  Surgeon: Jettie Booze, MD;  Location: [place] [facility];  Service: Cardiovascular;  Laterality: N/A;  . heart catherization    . LEFT HEART CATH AND CORONARY ANGIOGRAPHY N/A 06/15/2017   Procedure: LEFT HEART CATH AND CORONARY ANGIOGRAPHY;  Surgeon: Jettie Booze, MD;  Location: [place] [facility];  Service: Cardiovascular;  Laterality: N/A;  . TONSILLECTOMY      There were no vitals filed for this visit.  Subjective Assessment - 12/19/19 1348    Subjective  My Lt knee is doing good but Rt knee is 6/10 pain and it hurts when I do the steps at home    How long can you stand comfortably?  hour at most    How long can you walk comfortably?  30 min    Diagnostic tests  XR Bilat knee OA right greater than left knee.    Patient Stated Goals  be able to walk with less pain    Pain Onset  More than a month ago           [facility] Adult PT Treatment/Exercise - 12/19/19 0001      Therapeutic Activites    Therapeutic Activities  Other Therapeutic Activities    Other Therapeutic Activities  stairs, worked on educating and return demonstraton for going up with her good leg and down with her bad leg so  she can do the stairs with less pain, but then also had her do the stairs reciprocal pattern for when she is not having much knee pain as a strengthening exercise for home.      Knee/Hip Exercises: Stretches   Active Hamstring Stretch  2 reps;30 seconds;Right    Quad Stretch  Right;2 reps;30 seconds    Quad Stretch Limitations  supine with strap 30 sec X 2    Other Knee/Hip Stretches  heelslides AAROM 10 sec X 10 reps on Rt      Knee/Hip Exercises: Aerobic   Nustep  6 min L5       Knee/Hip Exercises: Machines for Strengthening   Total Gym Leg Press  bilat leg press 37 lbs X 2X10 reps      Knee/Hip Exercises: Standing   Knee Flexion  Both;10 reps    Knee Flexion Limitations  2 lbs    Hip Flexion  Both;10 reps    Hip Flexion Limitations  2 lbs    Hip Abduction  Both;10 reps    Abduction Limitations  2 lbs      Knee/Hip Exercises: Seated    Long Arc Quad  Both;10 reps    Long Arc Quad Weight  2 lbs.    Long [employer] Limitations  with ball squeeze      Knee/Hip Exercises: Supine   Short Arc [employer]  Both;10 reps    Short Arc [employer] Limitations  2 lbs      Film/video editor Parameters  tolerance    Electrical Stimulation Goals  Pain      Manual Therapy   Manual therapy comments  KT tape for Rt knee support anteriorly 2 I stips vertical horseshoe support then one horizontal I strip at inferior knee             PT Education - 12/19/19 1408    Education Details  stair training    Person(s) Educated  Patient    Methods  Explanation;Demonstration;Verbal cues    Comprehension  Verbalized understanding;Returned demonstration          PT Long Term Goals - 11/27/19 1425      PT LONG TERM GOAL #1   Title  Pt will be I and compliant with HEP. (Target for all goals 6 week 01/08/20)    Status  New      PT LONG TERM GOAL #2   Title  Pt will improve hip strength to 4+ and knee strength to 5/5 MMT in sitting to show improved strength for functional abilites.    Status  New      PT LONG TERM GOAL #3   Title  Pt will improve bilat knee AROM to >125 deg to improve function    Status  New      PT LONG TERM GOAL #4   Title  Pt willl report overall less than 3-4 pain with ususal activity.            Plan - 12/19/19 1409    Clinical Impression Statement  Stair training performed today as she relays pain with stairs, she is able to modify this by going up with the good leg and down with the bad leg to perform without pain although encouraged her to perform as able reciprocally as a strengthening exercise at home as long as this is not causing pain.  Progressed resistance slightly with her strength program with good tolerance. Trialed KT tape for Rt knee anterior support in efforts to reduce  pain/strain on her Rt knee.    Personal Factors and Comorbidities  Time since onset of injury/illness/exacerbation;Comorbidity 1    Comorbidities  knee OA    Examination-Activity Limitations  Bend;Squat;Stairs;Stand;Lift;Locomotion Level    Examination-Participation Restrictions  Cleaning;Community Activity;Shop;Laundry    Stability/Clinical Decision Making  Evolving/Moderate complexity    Rehab Potential  Good    PT Frequency  2x / week   1-2   PT Treatment/Interventions  ADLs/Self Care Home Management;Aquatic Therapy;Cryotherapy;Electrical Stimulation;Iontophoresis 4mg /ml Dexamethasone;Moist Heat;Ultrasound;Stair training;Therapeutic activities;Therapeutic exercise;Balance training;Neuromuscular re-education;Manual techniques;Passive range of motion;Dry needling;Joint Manipulations;Taping    PT Next Visit Plan  review and update HEP PRN, needs hip/knee strength and progression of standing activity as able    PT Home Exercise Plan  Access Code: [email or web address]    Consulted and Agree with Plan of Care  Patient       Patient will benefit from skilled therapeutic intervention in order to improve the following deficits and impairments:  Abnormal gait, Decreased strength, Decreased range of motion, Difficulty walking, Impaired flexibility, Pain  Visit Diagnosis: Chronic pain of right knee  Chronic pain of left knee  Muscle weakness (generalized)     Problem List Patient Active Problem List   Diagnosis Date Noted  . Hx of pleural effusion   . Dyspnea 08/16/2018  . Fall on or from sidewalk curb, initial encounter 05/31/2018  . History of acute inferior wall MI 08/22/2017  . Diabetes mellitus ([place])   . S/P right coronary artery (RCA) stent placement   . Overweight 05/05/2017  . Diabetes mellitus type 2, controlled, without complications ([place]) 99991111  . Asthmatic bronchitis , chronic ([place]) 05/10/2016  . Polymyositis ([place]) 09/15/2015  . History of sarcoidosis 09/15/2015  . Falling  03/13/2015  . Edema 02/13/2014  . Coronary artery disease involving native heart without angina pectoris 10/29/2013  . Hyperlipidemia 10/29/2013  . Iron (Fe) deficiency anemia 08/08/2013  . Vitamin B 12 deficiency 08/08/2013  . GERD (gastroesophageal reflux disease) 09/08/2011  . Weakness 02/22/2011  . Depression 02/22/2011  . [place] DISEASE 04/30/2009  . Vitamin D deficiency 02/05/2009  . Venous (peripheral) insufficiency 12/12/2007  . RENAL CALCULUS 12/12/2007  . DIZZINESS 12/12/2007  . HYPERCHOLESTEROLEMIA 09/16/2007  . Anxiety state 09/16/2007  . HYPERTENSION, BENIGN 09/16/2007  . Allergic rhinitis 09/16/2007  . IRRITABLE BOWEL SYNDROME 09/16/2007  . Myalgia and myositis 09/16/2007    Silvestre Mesi 12/19/2019, 2:31 PM  [facility] [address] [place], [place], [address] Phone: [phone]   Fax:  [phone]  Name: Jocelyn [name] MRN: [id number] Date of Birth: 07/26/1956

## 2019-12-20 NOTE — Telephone Encounter (Signed)
Still have not heard back from [facility] - faxing income info again.

## 2019-12-24 ENCOUNTER — Other Ambulatory Visit: Payer: Self-pay

## 2019-12-24 ENCOUNTER — Ambulatory Visit: Payer: Medicare PPO

## 2019-12-24 ENCOUNTER — Other Ambulatory Visit: Payer: Self-pay | Admitting: *Deleted

## 2019-12-24 ENCOUNTER — Encounter: Payer: Self-pay | Admitting: *Deleted

## 2019-12-24 DIAGNOSIS — I10 Essential (primary) hypertension: Secondary | ICD-10-CM

## 2019-12-24 NOTE — Progress Notes (Signed)
Patient ID: Jocelyn [name], female   DOB: 1956/09/17, 64 y.o.   MRN: [id number] 24 hour ambulatory blood pressure monitor applied with standard adult cuff.

## 2019-12-26 ENCOUNTER — Other Ambulatory Visit: Payer: Self-pay

## 2019-12-26 ENCOUNTER — Ambulatory Visit (INDEPENDENT_AMBULATORY_CARE_PROVIDER_SITE_OTHER): Payer: Medicare PPO | Admitting: Physical Therapy

## 2019-12-26 DIAGNOSIS — M25561 Pain in right knee: Secondary | ICD-10-CM | POA: Diagnosis not present

## 2019-12-26 DIAGNOSIS — M6281 Muscle weakness (generalized): Secondary | ICD-10-CM | POA: Diagnosis not present

## 2019-12-26 DIAGNOSIS — G8929 Other chronic pain: Secondary | ICD-10-CM | POA: Diagnosis not present

## 2019-12-26 DIAGNOSIS — M25562 Pain in left knee: Secondary | ICD-10-CM | POA: Diagnosis not present

## 2019-12-26 NOTE — Progress Notes (Signed)
Cardiology Office Note   Date:  12/27/2019   ID:  Jocelyn [name], DOB 1956/04/21, MRN [id number]  PCP:  Patient, No Pcp Per    No chief complaint on file.  CAD/Old MI  Wt Readings from Last 3 Encounters:  12/27/19 165 lb 9.6 oz (75.1 kg)  10/19/19 173 lb 12.8 oz (78.8 kg)  09/25/19 171 lb (77.6 kg)       History of Present Illness: Jocelyn [name] is a 64 y.o. female  With CAD. Cath manyyears ago Showed branch vessel disease. She has had difficulty tolerating statins. Eventually, she was put on Repatha.  She had an inferior STEMI on 06/15/2017. She received a drug-eluting stent to her distal right coronary artery. She had mild LAD disease. Her diagonal vessel disease has persisted over the years. Of note, she had severe radial spasm and it was noted that she had subclavian artery lusoria variant which made right radial approach quite difficult. This anatomic abnormality had been noted on a prior CT scan done at [facility].She had arm pain, heartburn, back pain when she had her MI.   Echo in 8/18 showed: Left ventricle: The cavity size was normal. There was mild concentric hypertrophy. Systolic function was normal. The estimated ejection fraction was in the range of 50% to 55%. Wall motion was normal; there were no regional wall motion abnormalities. Doppler parameters are consistent with abnormal left ventricular relaxation (grade 1 diastolic dysfunction). - Aortic valve: There was no regurgitation. - Mitral valve: There was mild regurgitation. - Right ventricle: The cavity size was normal. Wall thickness was normal. Systolic function was normal. - Atrial septum: No defect or patent foramen ovale was identified by color flow Doppler. - Tricuspid valve: There was no regurgitation.  She has had issues with HTN and headaches.THis seems to have resolved.  She had a pneumonia and pleural effusion in 10/18. It was bloody. Her Brilinta was stopped  at that time for thoracentesis.  Knee pain limits exercise.   Since the last visit, she has palpitations rarely.  She wore a BP monitor 24 hours, avg BP 138/79.   She has lost a little weight. She has ben eating out less.  Past Medical History:  Diagnosis Date  . Acute bronchitis   . Allergic rhinitis, cause unspecified   . Anemia   . Anxiety   . B12 deficiency   . BV (bacterial vaginosis) 06/22/1996  . Calculus of kidney   . Calculus of kidney   . Coronary atherosclerosis of unspecified type of vessel, native or graft   . Dizziness   . Essential hypertension, benign   . Fibroid 2003  . Fibromyalgia   . H/O dysmenorrhea 2008  . H/O varicella   . Headache(784.0)    frequently  . HSV-2 infection 2009  . Hyperplastic colon polyp 05/16/2014  . Irritable bowel syndrome   . Meniere's disease, unspecified   . Menses, irregular 2003  . Myalgia and myositis, unspecified   . Obstructive sleep apnea (adult) (pediatric)   . Perimenopausal symptoms 2003  . Pure hypercholesterolemia   . Sarcoidosis   . Type II or unspecified type diabetes mellitus without mention of complication, not stated as uncontrolled   . Unspecified venous (peripheral) insufficiency   . Vitamin D deficiency   . Vulvitis 2010  . Yeast infection     Past Surgical History:  Procedure Laterality Date  . COLONOSCOPY    . CORONARY STENT INTERVENTION N/A 06/15/2017   Procedure: CORONARY STENT  INTERVENTION;  Surgeon: Jettie Booze, MD;  Location: [place] [facility];  Service: Cardiovascular;  Laterality: N/A;  . heart catherization    . LEFT HEART CATH AND CORONARY ANGIOGRAPHY N/A 06/15/2017   Procedure: LEFT HEART CATH AND CORONARY ANGIOGRAPHY;  Surgeon: Jettie Booze, MD;  Location: [place] [facility];  Service: Cardiovascular;  Laterality: N/A;  . TONSILLECTOMY       Current Outpatient Medications  Medication Sig Dispense Refill  . ALPRAZolam (XANAX) 0.5 MG tablet TAKE 1/2 TABLET IN THE MORNING AND  AFTERNOON, AND 1 TABLET AT BEDTIME 60 tablet 1  . azaTHIOprine (IMURAN) 50 MG tablet TAKE TWO TABLETS IN THE MORNING BY MOUTH AND ONE TABLET AT NIGHT AS DIRECTED    . clopidogrel (PLAVIX) 75 MG tablet Take 1 tablet (75 mg total) by mouth daily. 90 tablet 3  . diltiazem (CARDIZEM CD) 240 MG 24 hr capsule TAKE 1 CAPSULE(240 MG) BY MOUTH DAILY 90 capsule 3  . Empagliflozin-metFORMIN HCl ER (SYNJARDY XR) 25-1000 MG TB24 Take 1 tablet by mouth daily.    . Evolocumab (REPATHA SURECLICK) [id number] MG/ML SOAJ Inject 1 pen into the skin every 14 (fourteen) days. 2 pen 11  . HP ACTHAR 80 UNIT/ML injectable gel Inject 80 Units into the skin 3 (three) times a week.     Vanessa Kick Ethyl (VASCEPA) 1 g CAPS Take 2 capsules (2 g total) by mouth 2 (two) times daily. 120 capsule 11  . losartan (COZAAR) 100 MG tablet Take 1 tablet (100 mg total) by mouth daily. 90 tablet 2  . meloxicam (MOBIC) 7.5 MG tablet Take 1 tablet (7.5 mg total) by mouth 2 (two) times daily as needed for pain. 30 tablet 2  . metoprolol tartrate (LOPRESSOR) 50 MG tablet Take 1 tablet (50 mg total) by mouth 2 (two) times daily. 60 tablet 3  . nitroGLYCERIN (NITROSTAT) 0.4 MG SL tablet Place 1 tablet (0.4 mg total) under the tongue every 5 (five) minutes as needed for chest pain. 25 tablet 2  . ondansetron (ZOFRAN-ODT) 4 MG disintegrating tablet Take 1 tablet (4 mg total) by mouth every 8 (eight) hours as needed for nausea or vomiting. 20 tablet 1  . ONETOUCH VERIO test strip CHECK BLOOD SUGAR TID AS DIRECTED    . pantoprazole (PROTONIX) 40 MG tablet Take 1 tablet (40 mg total) by mouth daily. 90 tablet 3  . spironolactone (ALDACTONE) 25 MG tablet Take 1 tablet (25 mg total) by mouth daily. 90 tablet 3  . traMADol (ULTRAM) 50 MG tablet Take 1 tablet (50 mg total) by mouth 3 (three) times daily as needed. 90 tablet 0  . valACYclovir (VALTREX) 500 MG tablet Take 500 mg by mouth daily.      No current facility-administered medications for this visit.     Allergies:   Actos [pioglitazone], Codeine, Januvia [sitagliptin], Lactose intolerance (gi), and Morphine    Social History:  The patient  reports that she has never smoked. She has never used smokeless tobacco. She reports that she does not drink alcohol or use drugs.   Family History:  The patient's family history includes Other in an other family member. She was adopted.    ROS:  Please see the history of present illness.   Otherwise, review of systems are positive for waiting for COVID vaccine.   All other systems are reviewed and negative.    PHYSICAL EXAM: VS:  BP (!) 150/68   Pulse 79   Ht 5\' 2"  (1.575 m)  Wt 165 lb 9.6 oz (75.1 kg)   SpO2 97%   BMI 30.29 kg/m  , BMI Body mass index is 30.29 kg/m. GEN: Well nourished, well developed, in no acute distress  HEENT: normal  Neck: no JVD, carotid bruits, or masses Cardiac: RRR; no murmurs, rubs, or gallops,no edema  Respiratory:  clear to auscultation bilaterally, normal work of breathing GI: soft, nontender, nondistended, + BS MS: no deformity or atrophy  Skin: warm and dry, no rash Neuro:  Strength and sensation are intact Psych: euthymic mood, full affect   EKG:   The ekg ordered today demonstrates NSR, inferior Q waves  Recent Labs: 11/06/2019: BUN 13; Creatinine, Ser 0.70; Potassium 4.5; Sodium 146   Lipid Panel    Component Value Date/Time   CHOL 183 12/29/2018 1026   TRIG 191 (H) 12/29/2018 1026   HDL 57 12/29/2018 1026   CHOLHDL 3.2 12/29/2018 1026   CHOLHDL 4 05/30/2018 1444   VLDL 36.2 05/30/2018 1444   LDLCALC 88 12/29/2018 1026   LDLDIRECT 91.6 08/14/2014 0822     Other studies Reviewed: Additional studies/ records that were reviewed today with results demonstrating: .   ASSESSMENT AND PLAN:  1. CAD/Old MI: No angina. COntinue aggressive secondary prevention.  If cath needed in the future, would use femoral approach.  Less bruising with clopidogrel instead of Brilinta. No CHF  sx. 2. Hyperlipidemia: LDL 88, TG 191.  On Repatha, vascepa.  Needs lipids checked.  3. Diabetes: A1C 7.1 in Feb 2020. Healthy diet.   4. Hypertension: Ambulatory blood pressure monitor reading reviewed and showed average blood pressure 138/79.  Home readings were lower then the MDs office.  COntinue meds.  CHeck readings at home.  Send in readings via mychart.     Current medicines are reviewed at length with the patient today.  The patient concerns regarding her medicines were addressed.  The following changes have been made:  No change  Labs/ tests ordered today include:  No orders of the defined types were placed in this encounter.   Recommend 150 minutes/week of aerobic exercise Low fat, low carb, high fiber diet recommended  Disposition:   FU in 6 months   Signed, Larae Grooms, MD  12/27/2019 4:25 PM    [facility] [place], [place], [place]  [address] Phone: [phone]; Fax: [phone]

## 2019-12-26 NOTE — Therapy (Signed)
[place], [place], [address] Phone: [phone]   Fax:  [phone]  Physical Therapy Treatment  Patient Details  Name: Jocelyn [name] MRN: [id number] Date of Birth: 07-23-1956 Referring Provider (PT): Leandrew Koyanagi, MD   Encounter Date: 12/26/2019  PT End of Session - 12/26/19 1337    Visit Number  4    Number of Visits  8    Date for PT Re-Evaluation  01/08/20    Authorization Type  [employer] MCR    PT Start Time  [id number]    PT Stop Time  1412    PT Time Calculation (min)  50 min    Activity Tolerance  Patient tolerated treatment well    Behavior During Therapy  [facility] for tasks assessed/performed       Past Medical History:  Diagnosis Date  . Acute bronchitis   . Allergic rhinitis, cause unspecified   . Anemia   . Anxiety   . B12 deficiency   . BV (bacterial vaginosis) 06/22/1996  . Calculus of kidney   . Calculus of kidney   . Coronary atherosclerosis of unspecified type of vessel, native or graft   . Dizziness   . Essential hypertension, benign   . Fibroid 2003  . Fibromyalgia   . H/O dysmenorrhea 2008  . H/O varicella   . Headache(784.0)    frequently  . HSV-2 infection 2009  . Hyperplastic colon polyp 05/16/2014  . Irritable bowel syndrome   . Meniere's disease, unspecified   . Menses, irregular 2003  . Myalgia and myositis, unspecified   . Obstructive sleep apnea (adult) (pediatric)   . Perimenopausal symptoms 2003  . Pure hypercholesterolemia   . Sarcoidosis   . Type II or unspecified type diabetes mellitus without mention of complication, not stated as uncontrolled   . Unspecified venous (peripheral) insufficiency   . Vitamin D deficiency   . Vulvitis 2010  . Yeast infection     Past Surgical History:  Procedure Laterality Date  . COLONOSCOPY    . CORONARY STENT INTERVENTION N/A 06/15/2017   Procedure: CORONARY STENT INTERVENTION;  Surgeon: Jettie Booze, MD;  Location: [place] [facility];  Service: Cardiovascular;  Laterality: N/A;  . heart catherization    . LEFT HEART CATH AND CORONARY ANGIOGRAPHY N/A 06/15/2017   Procedure: LEFT HEART CATH AND CORONARY ANGIOGRAPHY;  Surgeon: Jettie Booze, MD;  Location: [place] [facility];  Service: Cardiovascular;  Laterality: N/A;  . TONSILLECTOMY      There were no vitals filed for this visit.  Subjective Assessment - 12/26/19 1336    Subjective  knee pain about 5/10 bilat pain has been more intermittent lately    How long can you stand comfortably?  hour at most    How long can you walk comfortably?  30 min    Diagnostic tests  XR Bilat knee OA right greater than left knee.    Patient Stated Goals  be able to walk with less pain    Pain Onset  More than a month ago                       [facility] Adult PT Treatment/Exercise - 12/26/19 0001      Knee/Hip Exercises: Stretches   Active Hamstring Stretch  2 reps;30 seconds;Right    Quad Stretch  Right;2 reps;30 seconds    Quad Stretch Limitations  supine with strap 30 sec X 2  Knee/Hip Exercises: Aerobic   Nustep  6 min L5 UE/LE      Knee/Hip Exercises: Machines for Strengthening   Total Gym Leg Press  bilat leg press 50 lbs X 2X10 reps      Knee/Hip Exercises: Standing   Knee Flexion  Both;15 reps    Knee Flexion Limitations  3    Hip Abduction  Both;15 reps    Abduction Limitations  3      Knee/Hip Exercises: Seated   Long Arc Quad  Both;15 reps    Long Arc Quad Weight  3 lbs.      Knee/Hip Exercises: Supine   Bridges with Ball Squeeze  15 reps    Straight Leg Raises  Both;15 reps    Other Supine Knee/Hip Exercises  clam L3 band  X15 reps      Electrical Stimulation   Electrical Stimulation Location  all 4 pads on Rt knee    Electrical Stimulation Action  IFC 10 min    Electrical Stimulation Parameters  tolerance    Electrical Stimulation Goals  Pain                  PT Long Term Goals - 11/27/19 1425      PT LONG TERM  GOAL #1   Title  Pt will be I and compliant with HEP. (Target for all goals 6 week 01/08/20)    Status  New      PT LONG TERM GOAL #2   Title  Pt will improve hip strength to 4+ and knee strength to 5/5 MMT in sitting to show improved strength for functional abilites.    Status  New      PT LONG TERM GOAL #3   Title  Pt will improve bilat knee AROM to >125 deg to improve function    Status  New      PT LONG TERM GOAL #4   Title  Pt willl report overall less than 3-4 pain with ususal activity.            Plan - 12/26/19 1404    Clinical Impression Statement  Able to progress with her strength program with good tolerance and without complaints. Continued with TENS to reduce pain after session and KT tape as this appears to be helping with knee pain managment.    Personal Factors and Comorbidities  Time since onset of injury/illness/exacerbation;Comorbidity 1    Comorbidities  knee OA    Examination-Activity Limitations  Bend;Squat;Stairs;Stand;Lift;Locomotion Level    Examination-Participation Restrictions  Cleaning;Community Activity;Shop;Laundry    Stability/Clinical Decision Making  Evolving/Moderate complexity    Rehab Potential  Good    PT Frequency  2x / week   1-2   PT Treatment/Interventions  ADLs/Self Care Home Management;Aquatic Therapy;Cryotherapy;Electrical Stimulation;Iontophoresis 4mg /ml Dexamethasone;Moist Heat;Ultrasound;Stair training;Therapeutic activities;Therapeutic exercise;Balance training;Neuromuscular re-education;Manual techniques;Passive range of motion;Dry needling;Joint Manipulations;Taping    PT Next Visit Plan  review and update HEP PRN, needs hip/knee strength and progression of standing activity as able    PT Home Exercise Plan  Access Code: [email or web address]    Consulted and Agree with Plan of Care  Patient       Patient will benefit from skilled therapeutic intervention in order to improve the following deficits and impairments:  Abnormal gait, Decreased  strength, Decreased range of motion, Difficulty walking, Impaired flexibility, Pain  Visit Diagnosis: Chronic pain of right knee  Chronic pain of left knee  Muscle weakness (generalized)     Problem List Patient Active Problem  List   Diagnosis Date Noted  . Hx of pleural effusion   . Dyspnea 08/16/2018  . Fall on or from sidewalk curb, initial encounter 05/31/2018  . History of acute inferior wall MI 08/22/2017  . Diabetes mellitus ([employer])   . S/P right coronary artery (RCA) stent placement   . Overweight 05/05/2017  . Diabetes mellitus type 2, controlled, without complications ([place]) 99991111  . Asthmatic bronchitis , chronic ([place]) 05/10/2016  . Polymyositis ([place]) 09/15/2015  . History of sarcoidosis 09/15/2015  . Falling 03/13/2015  . Edema 02/13/2014  . Coronary artery disease involving native heart without angina pectoris 10/29/2013  . Hyperlipidemia 10/29/2013  . Iron (Fe) deficiency anemia 08/08/2013  . Vitamin B 12 deficiency 08/08/2013  . GERD (gastroesophageal reflux disease) 09/08/2011  . Weakness 02/22/2011  . Depression 02/22/2011  . [place] DISEASE 04/30/2009  . Vitamin D deficiency 02/05/2009  . Venous (peripheral) insufficiency 12/12/2007  . RENAL CALCULUS 12/12/2007  . DIZZINESS 12/12/2007  . HYPERCHOLESTEROLEMIA 09/16/2007  . Anxiety state 09/16/2007  . HYPERTENSION, BENIGN 09/16/2007  . Allergic rhinitis 09/16/2007  . IRRITABLE BOWEL SYNDROME 09/16/2007  . Myalgia and myositis 09/16/2007    Silvestre Mesi 12/26/2019, 2:08 PM  [facility] Physical Therapy [address] [place], [place], [address] Phone: [phone]   Fax:  [phone]  Name: HAEVEN YEITER MRN: [id number] Date of Birth: July 20, 1956

## 2019-12-27 ENCOUNTER — Ambulatory Visit: Payer: Medicare PPO | Admitting: Interventional Cardiology

## 2019-12-27 ENCOUNTER — Encounter: Payer: Self-pay | Admitting: Interventional Cardiology

## 2019-12-27 VITALS — BP 150/68 | HR 79 | Ht 62.0 in | Wt 165.6 lb

## 2019-12-27 DIAGNOSIS — I25118 Atherosclerotic heart disease of native coronary artery with other forms of angina pectoris: Secondary | ICD-10-CM | POA: Diagnosis not present

## 2019-12-27 DIAGNOSIS — I10 Essential (primary) hypertension: Secondary | ICD-10-CM

## 2019-12-27 DIAGNOSIS — I252 Old myocardial infarction: Secondary | ICD-10-CM | POA: Diagnosis not present

## 2019-12-27 DIAGNOSIS — E782 Mixed hyperlipidemia: Secondary | ICD-10-CM | POA: Diagnosis not present

## 2019-12-27 DIAGNOSIS — E119 Type 2 diabetes mellitus without complications: Secondary | ICD-10-CM

## 2019-12-27 NOTE — Patient Instructions (Signed)
Medication Instructions:  Your physician recommends that you continue on your current medications as directed. Please refer to the Current Medication list given to you today.  *If you need a refill on your cardiac medications before your next appointment, please call your pharmacy*  Lab Work: None ordered  If you have labs (blood work) drawn today and your tests are completely normal, you will receive your results only by: [name] MyChart Message (if you have MyChart) OR . A paper copy in the mail If you have any lab test that is abnormal or we need to change your treatment, we will call you to review the results.  Testing/Procedures: None ordered   Follow-Up: At [facility], you and your health needs are our priority.  As part of our continuing mission to provide you with exceptional heart care, we have created designated Provider Care Teams.  These Care Teams include your primary Cardiologist (physician) and Advanced Practice Providers (APPs -  Physician Assistants and Nurse Practitioners) who all work together to provide you with the care you need, when you need it.  Your next appointment:   6 month(s)  The format for your next appointment:   In Person  Provider:   You may see Larae Grooms, MD or one of the following Advanced Practice Providers on your designated Care Team:    Melina Copa, PA-C  Ermalinda Barrios, PA-C   Other Instructions Your physician has requested that you regularly monitor and record your blood pressure readings at home. Please use the same machine at the same time of day to check your readings and record them. I 2 weeks either send MyChart message with readings or call to report readings.

## 2019-12-28 NOTE — Telephone Encounter (Signed)
Account still not active. Tried calling [employer], was 10th in line for call - provided return # and never received a call back. Called again, this time #39 in line so I hung up. Faxed income verification for a 3rd time.

## 2019-12-31 NOTE — Addendum Note (Signed)
Addended by: Nikolis Berent E on: 12/31/2019 01:55 PM   Modules accepted: Orders

## 2020-01-02 ENCOUNTER — Other Ambulatory Visit: Payer: Self-pay

## 2020-01-02 ENCOUNTER — Ambulatory Visit: Payer: Medicare PPO | Admitting: Physical Therapy

## 2020-01-02 DIAGNOSIS — M6281 Muscle weakness (generalized): Secondary | ICD-10-CM

## 2020-01-02 DIAGNOSIS — G8929 Other chronic pain: Secondary | ICD-10-CM

## 2020-01-02 DIAGNOSIS — M25562 Pain in left knee: Secondary | ICD-10-CM

## 2020-01-02 DIAGNOSIS — M25561 Pain in right knee: Secondary | ICD-10-CM

## 2020-01-02 NOTE — Therapy (Signed)
[place], [place], [address] Phone: [phone]   Fax:  [phone]  Physical Therapy Treatment  Patient Details  Name: Jocelyn [name] MRN: [id number] Date of Birth: 09/18/1956 Referring Provider (PT): Leandrew Koyanagi, MD   Encounter Date: 01/02/2020  PT End of Session - 01/02/20 1344    Visit Number  5    Number of Visits  8    Date for PT Re-Evaluation  01/08/20    Authorization Type  [employer] MCR    PT Start Time  1315    PT Stop Time  1400    PT Time Calculation (min)  45 min    Activity Tolerance  Patient tolerated treatment well    Behavior During Therapy  [facility] for tasks assessed/performed       Past Medical History:  Diagnosis Date  . Acute bronchitis   . Allergic rhinitis, cause unspecified   . Anemia   . Anxiety   . B12 deficiency   . BV (bacterial vaginosis) 06/22/1996  . Calculus of kidney   . Calculus of kidney   . Coronary atherosclerosis of unspecified type of vessel, native or graft   . Dizziness   . Essential hypertension, benign   . Fibroid 2003  . Fibromyalgia   . H/O dysmenorrhea 2008  . H/O varicella   . Headache(784.0)    frequently  . HSV-2 infection 2009  . Hyperplastic colon polyp 05/16/2014  . Irritable bowel syndrome   . Meniere's disease, unspecified   . Menses, irregular 2003  . Myalgia and myositis, unspecified   . Obstructive sleep apnea (adult) (pediatric)   . Perimenopausal symptoms 2003  . Pure hypercholesterolemia   . Sarcoidosis   . Type II or unspecified type diabetes mellitus without mention of complication, not stated as uncontrolled   . Unspecified venous (peripheral) insufficiency   . Vitamin D deficiency   . Vulvitis 2010  . Yeast infection     Past Surgical History:  Procedure Laterality Date  . COLONOSCOPY    . CORONARY STENT INTERVENTION N/A 06/15/2017   Procedure: CORONARY STENT INTERVENTION;  Surgeon: Jettie Booze, MD;  Location: [place] [facility];  Service: Cardiovascular;  Laterality: N/A;  . heart catherization    . LEFT HEART CATH AND CORONARY ANGIOGRAPHY N/A 06/15/2017   Procedure: LEFT HEART CATH AND CORONARY ANGIOGRAPHY;  Surgeon: Jettie Booze, MD;  Location: [place] [facility];  Service: Cardiovascular;  Laterality: N/A;  . TONSILLECTOMY      There were no vitals filed for this visit.  Subjective Assessment - 01/02/20 1340    Subjective  knee pain about 5/10 bilat pain has been more intermittent lately, the weather is bothing it I think    How long can you stand comfortably?  hour at most    How long can you walk comfortably?  30 min    Diagnostic tests  XR Bilat knee OA right greater than left knee.    Patient Stated Goals  be able to walk with less pain    Pain Onset  More than a month ago      [facility] Adult PT Treatment/Exercise - 01/02/20 0001      Knee/Hip Exercises: Stretches   Active Hamstring Stretch  --    Quad Stretch  Right;2 reps;30 seconds    Quad Stretch Limitations  supine with strap 30 sec X 2    Gastroc Stretch Limitations  slantboard 2X30 sec  Knee/Hip Exercises: Aerobic   Nustep  9 min L5 UE/LE      Knee/Hip Exercises: Machines for Strengthening   Total Gym Leg Press  bilat leg press 75 lbs  X10 then dropped to 50 lbs X 2X10 reps      Knee/Hip Exercises: Standing   Knee Flexion  Both;15 reps    Knee Flexion Limitations  3    Hip Flexion  Both;15 reps    Hip Flexion Limitations  3    Hip Abduction  Both;15 reps    Abduction Limitations  3    Forward Step Up  Both;Hand Hold: 2;Step Height: 6";10 reps      Knee/Hip Exercises: Seated   Long Arc Quad  Both;15 reps    Long Arc Quad Weight  3 lbs.      Acupuncturist Location  all 4 pads on Rt knee    Electrical Stimulation Action  IFC 10 min    Electrical Stimulation Parameters  tolerance    Electrical Stimulation Goals  Pain      Manual Therapy   Manual therapy comments  KT tape for Rt knee  support anteriorly 2 I stips vertical horseshoe support then one horizontal I strip at inferior knee        PT Long Term Goals - 01/02/20 1356      PT LONG TERM GOAL #1   Title  Pt will be I and compliant with HEP. (Target for all goals 6 week 01/08/20)    Status  On-going      PT LONG TERM GOAL #2   Title  Pt will improve hip strength to 4+ and knee strength to 5/5 MMT in sitting to show improved strength for functional abilites.    Status  On-going      PT LONG TERM GOAL #3   Title  Pt will improve bilat knee AROM to >125 deg to improve function    Status  On-going      PT LONG TERM GOAL #4   Title  Pt willl report overall less than 3-4 pain with ususal activity.    Status  On-going            Plan - 01/02/20 1355    Clinical Impression Statement  progressed to more standing leg strengthening exericises today with only mild complaints. PT will continue to progress as able toward her functional goals.    Personal Factors and Comorbidities  Time since onset of injury/illness/exacerbation;Comorbidity 1    Comorbidities  knee OA    Examination-Activity Limitations  Bend;Squat;Stairs;Stand;Lift;Locomotion Level    Examination-Participation Restrictions  Cleaning;Community Activity;Shop;Laundry    Stability/Clinical Decision Making  Evolving/Moderate complexity    Rehab Potential  Good    PT Frequency  2x / week   1-2   PT Treatment/Interventions  ADLs/Self Care Home Management;Aquatic Therapy;Cryotherapy;Electrical Stimulation;Iontophoresis 4mg /ml Dexamethasone;Moist Heat;Ultrasound;Stair training;Therapeutic activities;Therapeutic exercise;Balance training;Neuromuscular re-education;Manual techniques;Passive range of motion;Dry needling;Joint Manipulations;Taping    PT Next Visit Plan  review and update HEP PRN, needs hip/knee strength and progression of standing activity as able    PT Home Exercise Plan  Access Code: [email or web address]    Consulted and Agree with Plan of Care  Patient        Patient will benefit from skilled therapeutic intervention in order to improve the following deficits and impairments:  Abnormal gait, Decreased strength, Decreased range of motion, Difficulty walking, Impaired flexibility, Pain  Visit Diagnosis: Chronic pain of right knee  Chronic pain of  left knee  Muscle weakness (generalized)     Problem List Patient Active Problem List   Diagnosis Date Noted  . Hx of pleural effusion   . Dyspnea 08/16/2018  . Fall on or from sidewalk curb, initial encounter 05/31/2018  . History of acute inferior wall MI 08/22/2017  . Diabetes mellitus ([place])   . S/P right coronary artery (RCA) stent placement   . Overweight 05/05/2017  . Diabetes mellitus type 2, controlled, without complications ([place]) 99991111  . Asthmatic bronchitis , chronic ([place]) 05/10/2016  . Polymyositis ([place]) 09/15/2015  . History of sarcoidosis 09/15/2015  . Falling 03/13/2015  . Edema 02/13/2014  . Coronary artery disease involving native heart without angina pectoris 10/29/2013  . Hyperlipidemia 10/29/2013  . Iron (Fe) deficiency anemia 08/08/2013  . Vitamin B 12 deficiency 08/08/2013  . GERD (gastroesophageal reflux disease) 09/08/2011  . Weakness 02/22/2011  . Depression 02/22/2011  . [place] DISEASE 04/30/2009  . Vitamin D deficiency 02/05/2009  . Venous (peripheral) insufficiency 12/12/2007  . RENAL CALCULUS 12/12/2007  . DIZZINESS 12/12/2007  . HYPERCHOLESTEROLEMIA 09/16/2007  . Anxiety state 09/16/2007  . HYPERTENSION, BENIGN 09/16/2007  . Allergic rhinitis 09/16/2007  . IRRITABLE BOWEL SYNDROME 09/16/2007  . Myalgia and myositis 09/16/2007    Silvestre Mesi 01/02/2020, 1:57 PM  [facility] Physical Therapy [address] [place], [place], [address] Phone: [phone]   Fax:  [phone]  Name: Jocelyn [name] MRN: [id number] Date of Birth: 12/23/1955

## 2020-01-04 ENCOUNTER — Other Ambulatory Visit: Payer: Self-pay

## 2020-01-04 ENCOUNTER — Other Ambulatory Visit: Payer: Medicare PPO | Admitting: *Deleted

## 2020-01-04 DIAGNOSIS — I252 Old myocardial infarction: Secondary | ICD-10-CM

## 2020-01-05 LAB — HEPATIC FUNCTION PANEL
ALT: 12 IU/L (ref 0–32)
AST: 29 IU/L (ref 0–40)
Albumin: 4.5 g/dL (ref 3.8–4.8)
Alkaline Phosphatase: 67 IU/L (ref 39–117)
Bilirubin Total: 0.2 mg/dL (ref 0.0–1.2)
Bilirubin, Direct: 0.07 mg/dL (ref 0.00–0.40)
Total Protein: 7.5 g/dL (ref 6.0–8.5)

## 2020-01-05 LAB — LIPID PANEL
Chol/HDL Ratio: 6.1 ratio — ABNORMAL HIGH (ref 0.0–4.4)
Cholesterol, Total: 264 mg/dL — ABNORMAL HIGH (ref 100–199)
HDL: 43 mg/dL (ref >39)
LDL Chol Calc (NIH): 174 mg/dL — ABNORMAL HIGH (ref 0–99)
Triglycerides: 250 mg/dL — ABNORMAL HIGH (ref 0–149)
VLDL Cholesterol Cal: 47 mg/dL — ABNORMAL HIGH (ref 5–40)

## 2020-01-07 ENCOUNTER — Telehealth: Payer: Self-pay | Admitting: Pharmacist

## 2020-01-07 ENCOUNTER — Other Ambulatory Visit: Payer: Self-pay

## 2020-01-07 DIAGNOSIS — E785 Hyperlipidemia, unspecified: Secondary | ICD-10-CM

## 2020-01-07 MED ORDER — METOPROLOL TARTRATE 50 MG PO TABS: 50.0000 mg | ORAL_TABLET | Freq: Two times a day (BID) | ORAL | 3 refills | Status: DC

## 2020-01-07 MED ORDER — DILTIAZEM HCL ER COATED BEADS 240 MG PO CP24: 240.0000 mg | ORAL_CAPSULE | Freq: Every day | ORAL | 3 refills | Status: DC

## 2020-01-07 NOTE — Telephone Encounter (Signed)
Spoke to patient about recent labs. She states she may have gotten off track with Repatha once or twice in the last several months. Has not missed any Vascepa. Denies any changes in diet. States her blood pressure and blood sugars have been higher.  Patient cannot take statins due to CK elevations Her baseline LDL level is 202. LDL previously 88 1 year ago on same regimen. Has been on Repatha since 2017.  States last injection of Repatha was 12/31/19.  Option include rechecking labs after next injection, switching to Praluent or adding Nexletol . We have decided to give one more Repatha injection and then check lipid panel 1 week later to confirm this was not an lab error or due to a possible missed dose.  Called [employer] to follow up on income verification. I was 161 in line. Left # for them to call back.

## 2020-01-08 NOTE — Telephone Encounter (Addendum)
Called [employer] on 2/22- was 161 in line- left message for call back  I also faxed over income form along with form [employer] sent patient in the mail

## 2020-01-08 NOTE — Telephone Encounter (Signed)
Patient called and stated that [employer] called her and stated they were processing everything and she should be able to pick up her medications today. Confirmed card was active online. Patient thanked [name] for all our help.

## 2020-01-09 ENCOUNTER — Encounter: Payer: Medicare PPO | Admitting: Physical Therapy

## 2020-01-16 ENCOUNTER — Ambulatory Visit: Payer: Medicare PPO | Admitting: Physical Therapy

## 2020-01-16 ENCOUNTER — Other Ambulatory Visit: Payer: Self-pay

## 2020-01-16 DIAGNOSIS — M25562 Pain in left knee: Secondary | ICD-10-CM | POA: Diagnosis not present

## 2020-01-16 DIAGNOSIS — M6281 Muscle weakness (generalized): Secondary | ICD-10-CM | POA: Diagnosis not present

## 2020-01-16 DIAGNOSIS — M25561 Pain in right knee: Secondary | ICD-10-CM

## 2020-01-16 DIAGNOSIS — G8929 Other chronic pain: Secondary | ICD-10-CM | POA: Diagnosis not present

## 2020-01-16 NOTE — Therapy (Signed)
[place], [place], [address] Phone: [phone]   Fax:  [phone]  Physical Therapy Treatment  Patient Details  Name: Jocelyn [name] MRN: [id number] Date of Birth: [birth year] Referring Provider (PT): Leandrew Koyanagi, MD   Encounter Date: 01/16/2020  PT End of Session - 01/16/20 1423    Visit Number  6    Number of Visits  8    Date for PT Re-Evaluation  01/08/20    Authorization Type  [employer] MCR    PT Start Time  1325    PT Stop Time  1405    PT Time Calculation (min)  40 min    Activity Tolerance  Patient tolerated treatment well    Behavior During Therapy  [facility] for tasks assessed/performed       Past Medical History:  Diagnosis Date  . Acute bronchitis   . Allergic rhinitis, cause unspecified   . Anemia   . Anxiety   . B12 deficiency   . BV (bacterial vaginosis) 06/22/1996  . Calculus of kidney   . Calculus of kidney   . Coronary atherosclerosis of unspecified type of vessel, native or graft   . Dizziness   . Essential hypertension, benign   . Fibroid 2003  . Fibromyalgia   . H/O dysmenorrhea 2008  . H/O varicella   . Headache(784.0)    frequently  . HSV-2 infection 2009  . Hyperplastic colon polyp 05/16/2014  . Irritable bowel syndrome   . Meniere's disease, unspecified   . Menses, irregular 2003  . Myalgia and myositis, unspecified   . Obstructive sleep apnea (adult) (pediatric)   . Perimenopausal symptoms 2003  . Pure hypercholesterolemia   . Sarcoidosis   . Type II or unspecified type diabetes mellitus without mention of complication, not stated as uncontrolled   . Unspecified venous (peripheral) insufficiency   . Vitamin D deficiency   . Vulvitis 2010  . Yeast infection     Past Surgical History:  Procedure Laterality Date  . COLONOSCOPY    . CORONARY STENT INTERVENTION N/A 06/15/2017   Procedure: CORONARY STENT INTERVENTION;  Surgeon: Jettie Booze, MD;  Location: [place] [facility];  Service: Cardiovascular;  Laterality: N/A;  . heart catherization    . LEFT HEART CATH AND CORONARY ANGIOGRAPHY N/A 06/15/2017   Procedure: LEFT HEART CATH AND CORONARY ANGIOGRAPHY;  Surgeon: Jettie Booze, MD;  Location: [place] [facility];  Service: Cardiovascular;  Laterality: N/A;  . TONSILLECTOMY      There were no vitals filed for this visit.  Subjective Assessment - 01/16/20 1408    Subjective  knee pain about 6/10 bilat today, it may just be OA pain    How long can you stand comfortably?  hour at most    How long can you walk comfortably?  30 min    Diagnostic tests  XR Bilat knee OA right greater than left knee.    Patient Stated Goals  be able to walk with less pain    Pain Onset  More than a month ago       [facility] Adult PT Treatment/Exercise - 01/16/20 0001      Knee/Hip Exercises: Stretches   Active Hamstring Stretch  2 reps;30 seconds;Right;Left    Active Hamstring Stretch Limitations  seated    Quad Stretch  Right;2 reps;30 seconds    Quad Stretch Limitations  supine with strap 30 sec X 2      Knee/Hip Exercises: Aerobic  Nustep  8 min L6 UE/LE      Knee/Hip Exercises: Machines for Strengthening   Total Gym Leg Press  bilat leg press 85 lbs 3X10      Knee/Hip Exercises: Seated   Long Arc Quad  Both;15 reps    Long Arc Quad Weight  3 lbs.    Ball Squeeze  5 sec X 10 reps    Hamstring Curl  Both;15 reps    Hamstring Limitations  green band      Knee/Hip Exercises: Supine   Bridges with [employer]  15 reps    Other Supine Knee/Hip Exercises  clam L3 band  X15 reps      Acupuncturist Location  bilat knees    Electrical Stimulation Action  pre mod    Electrical Stimulation Parameters  tolerance    Electrical Stimulation Goals  Pain         PT Long Term Goals - 01/02/20 1356      PT LONG TERM GOAL #1   Title  Pt will be I and compliant with HEP. (Target for all goals 6 week 01/08/20)    Status  On-going       PT LONG TERM GOAL #2   Title  Pt will improve hip strength to 4+ and knee strength to 5/5 MMT in sitting to show improved strength for functional abilites.    Status  On-going      PT LONG TERM GOAL #3   Title  Pt will improve bilat knee AROM to >125 deg to improve function    Status  On-going      PT LONG TERM GOAL #4   Title  Pt willl report overall less than 3-4 pain with ususal activity.    Status  On-going            Plan - 01/16/20 1423    Clinical Impression Statement  Having more overall knee OA pain so modified her standing exercises to sitting today as this allowed for better tolerance. Continue POC for 2 more visits as we transition to HEP    Personal Factors and Comorbidities  Time since onset of injury/illness/exacerbation;Comorbidity 1    Comorbidities  knee OA    Examination-Activity Limitations  Bend;Squat;Stairs;Stand;Lift;Locomotion Level    Examination-Participation Restrictions  Cleaning;Community Activity;Shop;Laundry    Stability/Clinical Decision Making  Evolving/Moderate complexity    Rehab Potential  Good    PT Frequency  2x / week   1-2   PT Treatment/Interventions  ADLs/Self Care Home Management;Aquatic Therapy;Cryotherapy;Electrical Stimulation;Iontophoresis 4mg /ml Dexamethasone;Moist Heat;Ultrasound;Stair training;Therapeutic activities;Therapeutic exercise;Balance training;Neuromuscular re-education;Manual techniques;Passive range of motion;Dry needling;Joint Manipulations;Taping    PT Next Visit Plan  review and update HEP PRN, needs hip/knee strength and progression of standing activity as able    PT Home Exercise Plan  Access Code: [email or web address]    Consulted and Agree with Plan of Care  Patient       Patient will benefit from skilled therapeutic intervention in order to improve the following deficits and impairments:  Abnormal gait, Decreased strength, Decreased range of motion, Difficulty walking, Impaired flexibility, Pain  Visit  Diagnosis: Chronic pain of right knee  Chronic pain of left knee  Muscle weakness (generalized)     Problem List Patient Active Problem List   Diagnosis Date Noted  . Hx of pleural effusion   . Dyspnea 08/16/2018  . Fall on or from sidewalk curb, initial encounter 05/31/2018  . History of acute inferior wall MI 08/22/2017  .  Diabetes mellitus ([place])   . S/P right coronary artery (RCA) stent placement   . Overweight 05/05/2017  . Diabetes mellitus type 2, controlled, without complications ([place]) 99991111  . Asthmatic bronchitis , chronic ([place]) 05/10/2016  . Polymyositis ([place]) 09/15/2015  . History of sarcoidosis 09/15/2015  . Falling 03/13/2015  . Edema 02/13/2014  . Coronary artery disease involving native heart without angina pectoris 10/29/2013  . Hyperlipidemia 10/29/2013  . Iron (Fe) deficiency anemia 08/08/2013  . Vitamin B 12 deficiency 08/08/2013  . GERD (gastroesophageal reflux disease) 09/08/2011  . Weakness 02/22/2011  . Depression 02/22/2011  . [place] DISEASE 04/30/2009  . Vitamin D deficiency 02/05/2009  . Venous (peripheral) insufficiency 12/12/2007  . RENAL CALCULUS 12/12/2007  . DIZZINESS 12/12/2007  . HYPERCHOLESTEROLEMIA 09/16/2007  . Anxiety state 09/16/2007  . HYPERTENSION, BENIGN 09/16/2007  . Allergic rhinitis 09/16/2007  . IRRITABLE BOWEL SYNDROME 09/16/2007  . Myalgia and myositis 09/16/2007    Debbe Odea, PT,DPT 01/16/2020, 2:25 PM  [facility] Physical Therapy [address] [place], [place], [address] Phone: [phone]   Fax:  [phone]  Name: CHINELLE ORDONES MRN: [id number] Date of Birth: June 12, 1956

## 2020-01-17 ENCOUNTER — Ambulatory Visit: Payer: Medicare PPO | Attending: Internal Medicine

## 2020-01-17 DIAGNOSIS — Z23 Encounter for immunization: Secondary | ICD-10-CM

## 2020-01-17 NOTE — Progress Notes (Signed)
   Covid-19 Vaccination Clinic  Name:  Jocelyn [name]    MRN: [id number] DOB: 10-28-1956  01/17/2020  [name] was observed post Covid-19 immunization for 15 minutes without incident. She was provided with Vaccine Information Sheet and instruction to access the V-Safe system.   [name] was instructed to call 911 with any severe reactions post vaccine: [name] Difficulty breathing  . Swelling of face and throat  . A fast heartbeat  . A bad rash all over body  . Dizziness and weakness   Immunizations Administered    Name Date Dose VIS Date Route   Pfizer COVID-19 Vaccine 01/17/2020  4:55 PM 0.3 mL 10/26/2019 Intramuscular   Manufacturer: [place]   Lot: [id number]   [place]: [id number]

## 2020-01-21 ENCOUNTER — Other Ambulatory Visit: Payer: Medicare PPO

## 2020-01-22 ENCOUNTER — Other Ambulatory Visit: Payer: Medicare PPO

## 2020-01-22 ENCOUNTER — Other Ambulatory Visit: Payer: Self-pay

## 2020-01-22 DIAGNOSIS — E785 Hyperlipidemia, unspecified: Secondary | ICD-10-CM

## 2020-01-23 ENCOUNTER — Ambulatory Visit: Payer: Medicare PPO | Admitting: Physical Therapy

## 2020-01-23 DIAGNOSIS — M6281 Muscle weakness (generalized): Secondary | ICD-10-CM | POA: Diagnosis not present

## 2020-01-23 DIAGNOSIS — M25561 Pain in right knee: Secondary | ICD-10-CM | POA: Diagnosis not present

## 2020-01-23 DIAGNOSIS — M25562 Pain in left knee: Secondary | ICD-10-CM | POA: Diagnosis not present

## 2020-01-23 DIAGNOSIS — G8929 Other chronic pain: Secondary | ICD-10-CM | POA: Diagnosis not present

## 2020-01-23 LAB — LIPID PANEL
Chol/HDL Ratio: 4.8 ratio — ABNORMAL HIGH (ref 0.0–4.4)
Cholesterol, Total: 201 mg/dL — ABNORMAL HIGH (ref 100–199)
HDL: 42 mg/dL (ref >39)
LDL Chol Calc (NIH): 126 mg/dL — ABNORMAL HIGH (ref 0–99)
Triglycerides: 187 mg/dL — ABNORMAL HIGH (ref 0–149)
VLDL Cholesterol Cal: 33 mg/dL (ref 5–40)

## 2020-01-24 ENCOUNTER — Telehealth: Payer: Self-pay | Admitting: Pharmacist

## 2020-01-24 MED ORDER — NEXLETOL 180 MG PO TABS: 1.0000 | ORAL_TABLET | Freq: Every day | ORAL | 3 refills | Status: DC

## 2020-01-24 NOTE — Telephone Encounter (Signed)
nexletol approved through 11/14/20. Rx sent to [facility] along with [employer] [facility]

## 2020-01-24 NOTE — Telephone Encounter (Signed)
Called patient to discuss lipid results. LDL is improved from 2 weeks ago, but still high. Discussed adding Nexletol. Patient in agreement. Will submit prior authorization. Patient cannot take statins due to hx of elevated LFT. Also intolerant to zetia.   She states pharmacy having issues with [employer] grant. States Jinny Blossom called the pharmacy for her the other day. Has not followed up yet. Patient will call us back to let us know if pharmacy was able to process. Advised that sometime there is an issue with [facility] system and they need to call their help desk to fix it.

## 2020-01-24 NOTE — Addendum Note (Signed)
Addended by: Marcelle Overlie D on: 01/24/2020 01:46 PM   Modules accepted: Orders

## 2020-01-24 NOTE — Therapy (Signed)
[place], [place], [address] Phone: [phone]   Fax:  [phone]  Physical Therapy Treatment/Recert  Patient Details  Name: Jocelyn [name] MRN: [id number] Date of Birth: Aug 19, 1956 Referring Provider (PT): Leandrew Koyanagi, MD   Encounter Date: 01/23/2020  PT End of Session - 01/24/20 0911    Visit Number  7    Number of Visits  12   recert on 2/12 to extend 4 more visits in 5 weeks   Date for PT Re-Evaluation  02/27/20    Authorization Type  [employer] MCR    PT Start Time  2482    PT Stop Time  1400    PT Time Calculation (min)  38 min    Activity Tolerance  Patient tolerated treatment well    Behavior During Therapy  [facility] for tasks assessed/performed       Past Medical History:  Diagnosis Date  . Acute bronchitis   . Allergic rhinitis, cause unspecified   . Anemia   . Anxiety   . B12 deficiency   . BV (bacterial vaginosis) 06/22/1996  . Calculus of kidney   . Calculus of kidney   . Coronary atherosclerosis of unspecified type of vessel, native or graft   . Dizziness   . Essential hypertension, benign   . Fibroid 2003  . Fibromyalgia   . H/O dysmenorrhea 2008  . H/O varicella   . Headache(784.0)    frequently  . HSV-2 infection 2009  . Hyperplastic colon polyp 05/16/2014  . Irritable bowel syndrome   . Meniere's disease, unspecified   . Menses, irregular 2003  . Myalgia and myositis, unspecified   . Obstructive sleep apnea (adult) (pediatric)   . Perimenopausal symptoms 2003  . Pure hypercholesterolemia   . Sarcoidosis   . Type II or unspecified type diabetes mellitus without mention of complication, not stated as uncontrolled   . Unspecified venous (peripheral) insufficiency   . Vitamin D deficiency   . Vulvitis 2010  . Yeast infection     Past Surgical History:  Procedure Laterality Date  . COLONOSCOPY    . CORONARY STENT INTERVENTION N/A 06/15/2017   Procedure: CORONARY STENT INTERVENTION;   Surgeon: Jettie Booze, MD;  Location: [place] [facility];  Service: Cardiovascular;  Laterality: N/A;  . heart catherization    . LEFT HEART CATH AND CORONARY ANGIOGRAPHY N/A 06/15/2017   Procedure: LEFT HEART CATH AND CORONARY ANGIOGRAPHY;  Surgeon: Jettie Booze, MD;  Location: [place] [facility];  Service: Cardiovascular;  Laterality: N/A;  . TONSILLECTOMY      There were no vitals filed for this visit.  Subjective Assessment - 01/24/20 0909    Subjective  Knee OA pain has been worse this week about 8/10 pain, I dont want to have to get any more shots and I dont wont to have the surgery either.    How long can you stand comfortably?  hour at most    How long can you walk comfortably?  30 min    Diagnostic tests  XR Bilat knee OA right greater than left knee.    Patient Stated Goals  be able to walk with less pain    Pain Onset  More than a month ago      [facility] Adult PT Treatment/Exercise - 01/24/20 0001      Knee/Hip Exercises: Aerobic   Recumbent Bike  8 min      Knee/Hip Exercises: Machines for Strengthening   Total Gym  Leg Press  bilat leg press 75 lbs 3X15      Knee/Hip Exercises: Standing   Hip Abduction  Both;15 reps    Abduction Limitations  3      Knee/Hip Exercises: Seated   Ball Squeeze  5 sec X 10 reps      Knee/Hip Exercises: Supine   Bridges  10 reps    Bridges Limitations  with ball sq    Other Supine Knee/Hip Exercises  clam L3 band  X15 reps      Modalities   Modalities  Iontophoresis      Iontophoresis   Type of Iontophoresis  Dexamethasone    Location  bilat medial knee at joint line    Dose  1.0 CC    Time  4-6 hour wear home patch             PT Education - 01/24/20 0911    Education Details  ionto instructions and precautions    Person(s) Educated  Patient    Methods  Explanation    Comprehension  Verbalized understanding          PT Long Term Goals - 01/24/20 0914      PT LONG TERM GOAL #1   Title  Pt will be I  and compliant with HEP. (Target for all goals 6 week 01/08/20)    Status  On-going      PT LONG TERM GOAL #2   Title  Pt will improve hip strength to 4+ and knee strength to 5/5 MMT in sitting to show improved strength for functional abilites.    Status  Partially Met      PT LONG TERM GOAL #3   Title  Pt will improve bilat knee AROM to >125 deg to improve function    Status  On-going      PT LONG TERM GOAL #4   Title  Pt willl report overall less than 3-4 pain with ususal activity.    Status  On-going            Plan - 01/24/20 0912    Clinical Impression Statement  Due to more knee OA pain trialed Ionto in efforts to reduce overall pain and swelling. She says she does not want injections or surgery so PT will continue with conservative care another 4 weeks    Personal Factors and Comorbidities  Time since onset of injury/illness/exacerbation;Comorbidity 1    Comorbidities  knee OA    Examination-Activity Limitations  Bend;Squat;Stairs;Stand;Lift;Locomotion Level    Examination-Participation Restrictions  Cleaning;Community Activity;Shop;Laundry    Stability/Clinical Decision Making  Evolving/Moderate complexity    Rehab Potential  Good    PT Frequency  2x / week   1-2   PT Treatment/Interventions  ADLs/Self Care Home Management;Aquatic Therapy;Cryotherapy;Electrical Stimulation;Iontophoresis 73m/ml Dexamethasone;Moist Heat;Ultrasound;Stair training;Therapeutic activities;Therapeutic exercise;Balance training;Neuromuscular re-education;Manual techniques;Passive range of motion;Dry needling;Joint Manipulations;Taping    PT Next Visit Plan  review and update HEP PRN, needs hip/knee strength and progression of standing activity as able    PT Home Exercise Plan  Access Code: [email or web address]    Consulted and Agree with Plan of Care  Patient       Patient will benefit from skilled therapeutic intervention in order to improve the following deficits and impairments:  Abnormal gait, Decreased  strength, Decreased range of motion, Difficulty walking, Impaired flexibility, Pain  Visit Diagnosis: Chronic pain of right knee  Chronic pain of left knee  Muscle weakness (generalized)     Problem List Patient Active Problem  List   Diagnosis Date Noted  . Hx of pleural effusion   . Dyspnea 08/16/2018  . Fall on or from sidewalk curb, initial encounter 05/31/2018  . History of acute inferior wall MI 08/22/2017  . Diabetes mellitus ([place])   . S/P right coronary artery (RCA) stent placement   . Overweight 05/05/2017  . Diabetes mellitus type 2, controlled, without complications ([place]) 01/07/3611  . Asthmatic bronchitis , chronic ([place]) 05/10/2016  . Polymyositis ([place]) 09/15/2015  . History of sarcoidosis 09/15/2015  . Falling 03/13/2015  . Edema 02/13/2014  . Coronary artery disease involving native heart without angina pectoris 10/29/2013  . Hyperlipidemia 10/29/2013  . Iron (Fe) deficiency anemia 08/08/2013  . Vitamin B 12 deficiency 08/08/2013  . GERD (gastroesophageal reflux disease) 09/08/2011  . Weakness 02/22/2011  . Depression 02/22/2011  . [place] DISEASE 04/30/2009  . Vitamin D deficiency 02/05/2009  . Venous (peripheral) insufficiency 12/12/2007  . RENAL CALCULUS 12/12/2007  . DIZZINESS 12/12/2007  . HYPERCHOLESTEROLEMIA 09/16/2007  . Anxiety state 09/16/2007  . HYPERTENSION, BENIGN 09/16/2007  . Allergic rhinitis 09/16/2007  . IRRITABLE BOWEL SYNDROME 09/16/2007  . Myalgia and myositis 09/16/2007    Debbe Odea, PT,DPT 01/24/2020, 9:14 AM  [facility] Physical [facility] [address] [place], [place], [address] Phone: [phone]   Fax:  [phone]  Name: Jocelyn [name] MRN: [id number] Date of Birth: 03-24-1956

## 2020-01-28 ENCOUNTER — Telehealth: Payer: Self-pay | Admitting: Interventional Cardiology

## 2020-01-28 NOTE — Telephone Encounter (Signed)
   Primary Cardiologist: Larae Grooms, MD  Chart reviewed as part of pre-operative protocol coverage.  Hx of inferior STEMI on 06/15/2017 s/p drug-eluting stent to her distal right coronary artery. She had mild LAD disease. Her diagonal vessel disease has persisted over the years.   Dr. Irish Lack, she was doing well when seen by you 12/27/19. Per note, she is on monotherapy with Plavix.  Please give your recommendations regarding antiplatelet therapy. Please forward your response to [facility] PREOP.   Thank you   Leanor Kail, PA 01/28/2020, 12:17 PM

## 2020-01-28 NOTE — Telephone Encounter (Signed)
   [facility] Pre-operative Risk Assessment    Request for surgical clearance:  1. What type of surgery is being performed? Needle core biopsy of right breast  2. When is this surgery scheduled? 02/06/2020  3. What type of clearance is required (medical clearance vs. Pharmacy clearance to hold med vs. Both)? both  4. Are there any medications that need to be held prior to surgery and how long? Aspirin about 4 days prior  5. Practice name and name of physician performing surgery? Dr. Emmit Pomfret / [facility]   6. What is your office phone number [phone]   7.   What is your office fax number [phone]  8.   Anesthesia type (None, local, MAC, general) ? none   Jocelyn [name] 01/28/2020, 11:30 AM  _________________________________________________________________   (provider comments below)

## 2020-01-28 NOTE — Telephone Encounter (Signed)
OK to hold Plavix for 5 days prior to biopsy

## 2020-02-04 ENCOUNTER — Other Ambulatory Visit: Payer: Self-pay | Admitting: Interventional Cardiology

## 2020-02-04 MED ORDER — LOSARTAN POTASSIUM 100 MG PO TABS: 100.0000 mg | ORAL_TABLET | Freq: Every day | ORAL | 3 refills | Status: DC

## 2020-02-06 ENCOUNTER — Ambulatory Visit: Payer: Medicare PPO | Admitting: Physical Therapy

## 2020-02-06 ENCOUNTER — Other Ambulatory Visit: Payer: Self-pay

## 2020-02-06 DIAGNOSIS — M25562 Pain in left knee: Secondary | ICD-10-CM | POA: Diagnosis not present

## 2020-02-06 DIAGNOSIS — M25561 Pain in right knee: Secondary | ICD-10-CM

## 2020-02-06 DIAGNOSIS — M6281 Muscle weakness (generalized): Secondary | ICD-10-CM | POA: Diagnosis not present

## 2020-02-06 DIAGNOSIS — G8929 Other chronic pain: Secondary | ICD-10-CM

## 2020-02-06 NOTE — Therapy (Signed)
Jocelyn [name] [place], [place], [address] Phone: [phone]   Fax:  [phone]  Physical Therapy Treatment  Patient Details  Name: Jocelyn [name] MRN: [id number] Date of Birth: 15-Jun-1956 Referring Provider (PT): Leandrew Koyanagi, MD   Encounter Date: 02/06/2020  PT End of Session - 02/06/20 1555    Visit Number  8    Number of Visits  12   recert on 2/37 to extend 4 more visits in 5 weeks   Date for PT Re-Evaluation  02/27/20    Authorization Type  [employer] MCR    PT Start Time  6283    PT Stop Time  1525    PT Time Calculation (min)  40 min    Activity Tolerance  Patient tolerated treatment well    Behavior During Therapy  [facility] for tasks assessed/performed       Past Medical History:  Diagnosis Date  . Acute bronchitis   . Allergic rhinitis, cause unspecified   . Anemia   . Anxiety   . B12 deficiency   . BV (bacterial vaginosis) 06/22/1996  . Calculus of kidney   . Calculus of kidney   . Coronary atherosclerosis of unspecified type of vessel, native or graft   . Dizziness   . Essential hypertension, benign   . Fibroid 2003  . Fibromyalgia   . H/O dysmenorrhea 2008  . H/O varicella   . Headache(784.0)    frequently  . HSV-2 infection 2009  . Hyperplastic colon polyp 05/16/2014  . Irritable bowel syndrome   . Meniere's disease, unspecified   . Menses, irregular 2003  . Myalgia and myositis, unspecified   . Obstructive sleep apnea (adult) (pediatric)   . Perimenopausal symptoms 2003  . Pure hypercholesterolemia   . Sarcoidosis   . Type II or unspecified type diabetes mellitus without mention of complication, not stated as uncontrolled   . Unspecified venous (peripheral) insufficiency   . Vitamin D deficiency   . Vulvitis 2010  . Yeast infection     Past Surgical History:  Procedure Laterality Date  . COLONOSCOPY    . CORONARY STENT INTERVENTION N/A 06/15/2017   Procedure: CORONARY STENT INTERVENTION;  Surgeon:  Jettie Booze, MD;  Location: [facility];  Service: Cardiovascular;  Laterality: N/A;  . heart catherization    . LEFT HEART CATH AND CORONARY ANGIOGRAPHY N/A 06/15/2017   Procedure: LEFT HEART CATH AND CORONARY ANGIOGRAPHY;  Surgeon: Jettie Booze, MD;  Location: [place] [facility];  Service: Cardiovascular;  Laterality: N/A;  . TONSILLECTOMY      There were no vitals filed for this visit.  Subjective Assessment - 02/06/20 1553    Subjective  bilat knee pain is doing better today from 8/10 to 6/10. she feels the ionto patches helped some    How long can you stand comfortably?  hour at most    How long can you walk comfortably?  30 min    Diagnostic tests  XR Bilat knee OA right greater than left knee.    Patient Stated Goals  be able to walk with less pain    Pain Onset  More than a month ago          [facility] Adult PT Treatment/Exercise - 02/06/20 0001      Knee/Hip Exercises: Stretches   Sports administrator  Both;3 reps;30 seconds    Quad Stretch Limitations  prone with strap      Knee/Hip Exercises: Aerobic   Recumbent  Bike  8 min      Knee/Hip Exercises: Machines for Strengthening   Total Gym Leg Press  bilat leg press 75 lbs 3X15      Knee/Hip Exercises: Standing   Knee Flexion  Both;15 reps    Knee Flexion Limitations  5    Hip Abduction  Both;15 reps    Abduction Limitations  5    Other Standing Knee Exercises  step ups on 6 inch step X 15 reps bilat with UE support      Knee/Hip Exercises: Seated   Long Arc Quad  Both;15 reps    Long Arc Quad Weight  5 lbs.    Sit to Sand  15 reps;without UE support   with ball sq     Iontophoresis   Type of Iontophoresis  Dexamethasone    Location  bilat medial knee at joint line    Dose  1.0 CC    Time  4-6 hour wear home patch                  PT Long Term Goals - 01/24/20 0914      PT LONG TERM GOAL #1   Title  Pt will be I and compliant with HEP. (Target for all goals 6 week 01/08/20)     Status  On-going      PT LONG TERM GOAL #2   Title  Pt will improve hip strength to 4+ and knee strength to 5/5 MMT in sitting to show improved strength for functional abilites.    Status  Partially Met      PT LONG TERM GOAL #3   Title  Pt will improve bilat knee AROM to >125 deg to improve function    Status  On-going      PT LONG TERM GOAL #4   Title  Pt willl report overall less than 3-4 pain with ususal activity.    Status  On-going            Plan - 02/06/20 1556    Clinical Impression Statement  continued with ionto patch as she feels this helped reduce some pain after last session. Continued with stretching and knee strength as tolerated. Continue POC    Personal Factors and Comorbidities  Time since onset of injury/illness/exacerbation;Comorbidity 1    Comorbidities  knee OA    Examination-Activity Limitations  Bend;Squat;Stairs;Stand;Lift;Locomotion Level    Examination-Participation Restrictions  Cleaning;Community Activity;Shop;Laundry    Stability/Clinical Decision Making  Evolving/Moderate complexity    Rehab Potential  Good    PT Frequency  2x / week   1-2   PT Treatment/Interventions  ADLs/Self Care Home Management;Aquatic Therapy;Cryotherapy;Electrical Stimulation;Iontophoresis 67m/ml Dexamethasone;Moist Heat;Ultrasound;Stair training;Therapeutic activities;Therapeutic exercise;Balance training;Neuromuscular re-education;Manual techniques;Passive range of motion;Dry needling;Joint Manipulations;Taping    PT Next Visit Plan  review and update HEP PRN, needs hip/knee strength and progression of standing activity as able    PT Home Exercise Plan  Access Code: [email or web address]    Consulted and Agree with Plan of Care  Patient       Patient will benefit from skilled therapeutic intervention in order to improve the following deficits and impairments:  Abnormal gait, Decreased strength, Decreased range of motion, Difficulty walking, Impaired flexibility, Pain  Visit  Diagnosis: Chronic pain of right knee  Chronic pain of left knee  Muscle weakness (generalized)     Problem List Patient Active Problem List   Diagnosis Date Noted  . Hx of pleural effusion   . Dyspnea 08/16/2018  .  Fall on or from sidewalk curb, initial encounter 05/31/2018  . History of acute inferior wall MI 08/22/2017  . Diabetes mellitus ([place])   . S/P right coronary artery (RCA) stent placement   . Overweight 05/05/2017  . Diabetes mellitus type 2, controlled, without complications ([place]) 58/52/7782  . Asthmatic bronchitis , chronic ([place]) 05/10/2016  . Polymyositis ([place]) 09/15/2015  . History of sarcoidosis 09/15/2015  . Falling 03/13/2015  . Edema 02/13/2014  . Coronary artery disease involving native heart without angina pectoris 10/29/2013  . Hyperlipidemia 10/29/2013  . Iron (Fe) deficiency anemia 08/08/2013  . Vitamin B 12 deficiency 08/08/2013  . GERD (gastroesophageal reflux disease) 09/08/2011  . Weakness 02/22/2011  . Depression 02/22/2011  . [place] DISEASE 04/30/2009  . Vitamin D deficiency 02/05/2009  . Venous (peripheral) insufficiency 12/12/2007  . RENAL CALCULUS 12/12/2007  . DIZZINESS 12/12/2007  . HYPERCHOLESTEROLEMIA 09/16/2007  . Anxiety state 09/16/2007  . HYPERTENSION, BENIGN 09/16/2007  . Allergic rhinitis 09/16/2007  . IRRITABLE BOWEL SYNDROME 09/16/2007  . Myalgia and myositis 09/16/2007    Debbe Odea, PT,DPT 02/06/2020, 3:58 PM  [facility] Physical Therapy [address] [place], [place], [address] Phone: [phone]   Fax:  [phone]  Name: KASSADEE CARAWAN MRN: [id number] Date of Birth: 1956/03/20

## 2020-02-13 ENCOUNTER — Ambulatory Visit (INDEPENDENT_AMBULATORY_CARE_PROVIDER_SITE_OTHER): Payer: Medicare PPO | Admitting: Physical Therapy

## 2020-02-13 ENCOUNTER — Other Ambulatory Visit: Payer: Self-pay

## 2020-02-13 ENCOUNTER — Ambulatory Visit: Payer: Medicare PPO | Attending: Internal Medicine

## 2020-02-13 DIAGNOSIS — M25562 Pain in left knee: Secondary | ICD-10-CM

## 2020-02-13 DIAGNOSIS — M25561 Pain in right knee: Secondary | ICD-10-CM | POA: Diagnosis not present

## 2020-02-13 DIAGNOSIS — M6281 Muscle weakness (generalized): Secondary | ICD-10-CM | POA: Diagnosis not present

## 2020-02-13 DIAGNOSIS — G8929 Other chronic pain: Secondary | ICD-10-CM | POA: Diagnosis not present

## 2020-02-13 DIAGNOSIS — Z23 Encounter for immunization: Secondary | ICD-10-CM

## 2020-02-13 NOTE — Therapy (Signed)
[place], [place], [address] Phone: [phone]   Fax:  [phone]  Physical Therapy Treatment  Patient Details  Name: Jocelyn [name] MRN: [id number] Date of Birth: [birth year] Referring Provider (PT): Leandrew Koyanagi, MD   Encounter Date: 02/13/2020  PT End of Session - 02/13/20 1353    Visit Number  9    Number of Visits  12   recert on 1/66 to extend 4 more visits in 5 weeks   Date for PT Re-Evaluation  02/27/20    Authorization Type  [employer] MCR    PT Start Time  1315    PT Stop Time  1355    PT Time Calculation (min)  40 min    Activity Tolerance  Patient tolerated treatment well    Behavior During Therapy  [facility] for tasks assessed/performed       Past Medical History:  Diagnosis Date  . Acute bronchitis   . Allergic rhinitis, cause unspecified   . Anemia   . Anxiety   . B12 deficiency   . BV (bacterial vaginosis) 06/22/1996  . Calculus of kidney   . Calculus of kidney   . Coronary atherosclerosis of unspecified type of vessel, native or graft   . Dizziness   . Essential hypertension, benign   . Fibroid 2003  . Fibromyalgia   . H/O dysmenorrhea 2008  . H/O varicella   . Headache(784.0)    frequently  . HSV-2 infection 2009  . Hyperplastic colon polyp 05/16/2014  . Irritable bowel syndrome   . Meniere's disease, unspecified   . Menses, irregular 2003  . Myalgia and myositis, unspecified   . Obstructive sleep apnea (adult) (pediatric)   . Perimenopausal symptoms 2003  . Pure hypercholesterolemia   . Sarcoidosis   . Type II or unspecified type diabetes mellitus without mention of complication, not stated as uncontrolled   . Unspecified venous (peripheral) insufficiency   . Vitamin D deficiency   . Vulvitis 2010  . Yeast infection     Past Surgical History:  Procedure Laterality Date  . COLONOSCOPY    . CORONARY STENT INTERVENTION N/A 06/15/2017   Procedure: CORONARY STENT INTERVENTION;  Surgeon:  Jettie Booze, MD;  Location: [place] [facility];  Service: Cardiovascular;  Laterality: N/A;  . heart catherization    . LEFT HEART CATH AND CORONARY ANGIOGRAPHY N/A 06/15/2017   Procedure: LEFT HEART CATH AND CORONARY ANGIOGRAPHY;  Surgeon: Jettie Booze, MD;  Location: [place] [facility];  Service: Cardiovascular;  Laterality: N/A;  . TONSILLECTOMY      There were no vitals filed for this visit.  Subjective Assessment - 02/13/20 1337    Subjective  bilat knee pain is doing better today to a 5/10 overall. If she walks too long she still gets knee pain    How long can you stand comfortably?  hour at most    How long can you walk comfortably?  30 min    Diagnostic tests  XR Bilat knee OA right greater than left knee.    Patient Stated Goals  be able to walk with less pain    Pain Onset  More than a month ago                       [facility] Adult PT Treatment/Exercise - 02/13/20 0001      Knee/Hip Exercises: Stretches   Active Hamstring Stretch  2 reps;30 seconds;Right;Left    Active  Hamstring Stretch Limitations  seated    Knee: Self-Stretch Limitations  seated heelslides with self OP 10 sec X 5 reps bilat    Gastroc Stretch Limitations  slantboard 3X30 sec      Knee/Hip Exercises: Aerobic   Nustep  8 min L5 UE/LE      Knee/Hip Exercises: Machines for Strengthening   Total Gym Leg Press  bilat leg press 56 lbs 3X15      Knee/Hip Exercises: Standing   Knee Flexion  Both;15 reps    Knee Flexion Limitations  5    Hip Abduction  Both;15 reps    Abduction Limitations  5    Other Standing Knee Exercises  step ups on 6 inch step X 10 reps bilat with UE support fwd and lateral      Knee/Hip Exercises: Seated   Long Arc Quad  Both;15 reps    Long Arc Quad Weight  5 lbs.      Iontophoresis   Type of Iontophoresis  Dexamethasone    Location  bilat medial knee at joint line    Dose  1.0 CC    Time  4-6 hour wear home patch                  PT  Long Term Goals - 01/24/20 0914      PT LONG TERM GOAL #1   Title  Pt will be I and compliant with HEP. (Target for all goals 6 week 01/08/20)    Status  On-going      PT LONG TERM GOAL #2   Title  Pt will improve hip strength to 4+ and knee strength to 5/5 MMT in sitting to show improved strength for functional abilites.    Status  Partially Met      PT LONG TERM GOAL #3   Title  Pt will improve bilat knee AROM to >125 deg to improve function    Status  On-going      PT LONG TERM GOAL #4   Title  Pt willl report overall less than 3-4 pain with ususal activity.    Status  On-going            Plan - 02/13/20 1353    Clinical Impression Statement  Pain levels are slightly better on avg. Continued with bilat knee stretching and strengthening to tolerance. She will need progress note next visit    Personal Factors and Comorbidities  Time since onset of injury/illness/exacerbation;Comorbidity 1    Comorbidities  knee OA    Examination-Activity Limitations  Bend;Squat;Stairs;Stand;Lift;Locomotion Level    Examination-Participation Restrictions  Cleaning;Community Activity;Shop;Laundry    Stability/Clinical Decision Making  Evolving/Moderate complexity    Rehab Potential  Good    PT Frequency  2x / week   1-2   PT Treatment/Interventions  ADLs/Self Care Home Management;Aquatic Therapy;Cryotherapy;Electrical Stimulation;Iontophoresis 40m/ml Dexamethasone;Moist Heat;Ultrasound;Stair training;Therapeutic activities;Therapeutic exercise;Balance training;Neuromuscular re-education;Manual techniques;Passive range of motion;Dry needling;Joint Manipulations;Taping    PT Next Visit Plan  review and update HEP PRN, needs hip/knee strength and progression of standing activity as able    PT Home Exercise Plan  Access Code: [email or web address]    Consulted and Agree with Plan of Care  Patient       Patient will benefit from skilled therapeutic intervention in order to improve the following deficits and  impairments:  Abnormal gait, Decreased strength, Decreased range of motion, Difficulty walking, Impaired flexibility, Pain  Visit Diagnosis: Chronic pain of right knee  Chronic pain of left knee  Muscle weakness (generalized)     Problem List Patient Active Problem List   Diagnosis Date Noted  . Hx of pleural effusion   . Dyspnea 08/16/2018  . Fall on or from sidewalk curb, initial encounter 05/31/2018  . History of acute inferior wall MI 08/22/2017  . Diabetes mellitus ([place])   . S/P right coronary artery (RCA) stent placement   . Overweight 05/05/2017  . Diabetes mellitus type 2, controlled, without complications ([place]) 09/90/6893  . Asthmatic bronchitis , chronic ([place]) 05/10/2016  . Polymyositis ([place]) 09/15/2015  . History of sarcoidosis 09/15/2015  . Falling 03/13/2015  . Edema 02/13/2014  . Coronary artery disease involving native heart without angina pectoris 10/29/2013  . Hyperlipidemia 10/29/2013  . Iron (Fe) deficiency anemia 08/08/2013  . Vitamin B 12 deficiency 08/08/2013  . GERD (gastroesophageal reflux disease) 09/08/2011  . Weakness 02/22/2011  . Depression 02/22/2011  . [place] DISEASE 04/30/2009  . Vitamin D deficiency 02/05/2009  . Venous (peripheral) insufficiency 12/12/2007  . RENAL CALCULUS 12/12/2007  . DIZZINESS 12/12/2007  . HYPERCHOLESTEROLEMIA 09/16/2007  . Anxiety state 09/16/2007  . HYPERTENSION, BENIGN 09/16/2007  . Allergic rhinitis 09/16/2007  . IRRITABLE BOWEL SYNDROME 09/16/2007  . Myalgia and myositis 09/16/2007    Debbe Odea, PT,DPT 02/13/2020, 2:04 PM  [facility] Physical Therapy [address] [place], [place], [address] Phone: [phone]   Fax:  [phone]  Name: PHYLISS HULICK MRN: [id number] Date of Birth: 06/11/1956

## 2020-02-13 NOTE — Progress Notes (Signed)
   Covid-19 Vaccination Clinic  Name:  Jocelyn [name]    MRN: [id number] DOB: 1956-02-24  02/13/2020  [name] was observed post Covid-19 immunization for 15 minutes without incident. She was provided with Vaccine Information Sheet and instruction to access the V-Safe system.   [name] was instructed to call 911 with any severe reactions post vaccine: [name] Difficulty breathing  . Swelling of face and throat  . A fast heartbeat  . A bad rash all over body  . Dizziness and weakness   Immunizations Administered    Name Date Dose VIS Date Route   Pfizer COVID-19 Vaccine 02/13/2020  3:12 PM 0.3 mL 10/26/2019 Intramuscular   Manufacturer: [employer]   Lot: [id number]   [place]: [id number]

## 2020-02-20 ENCOUNTER — Other Ambulatory Visit: Payer: Self-pay

## 2020-02-20 ENCOUNTER — Ambulatory Visit: Payer: Medicare PPO | Admitting: Physical Therapy

## 2020-02-20 ENCOUNTER — Encounter: Payer: Self-pay | Admitting: Physical Therapy

## 2020-02-20 DIAGNOSIS — M6281 Muscle weakness (generalized): Secondary | ICD-10-CM | POA: Diagnosis not present

## 2020-02-20 DIAGNOSIS — M25562 Pain in left knee: Secondary | ICD-10-CM | POA: Diagnosis not present

## 2020-02-20 DIAGNOSIS — G8929 Other chronic pain: Secondary | ICD-10-CM

## 2020-02-20 DIAGNOSIS — M25561 Pain in right knee: Secondary | ICD-10-CM

## 2020-02-20 NOTE — Therapy (Signed)
[facility] Physical Therapy [address] [place], [place], [address] Phone: [phone]   Fax:  [phone]  Physical Therapy Treatment/Progress note Progress Note reporting period 11/27/19 to 02/20/20  See below for objective and subjective measurements relating to patients progress with PT.   Patient Details  Name: Jocelyn [name] MRN: [id number] Date of Birth: [birth year] Referring Provider (PT): Leandrew Koyanagi, MD   Encounter Date: 02/20/2020  PT End of Session - 02/20/20 1546    Visit Number  10    Number of Visits  12   recert on 2/45 to extend 4 more visits in 5 weeks   Date for PT Re-Evaluation  02/27/20    Authorization Type  [employer] MCR    Progress Note Due on Visit  20    PT Start Time  8099    PT Stop Time  1530    PT Time Calculation (min)  45 min    Activity Tolerance  Patient tolerated treatment well    Behavior During Therapy  [facility] for tasks assessed/performed       Past Medical History:  Diagnosis Date  . Acute bronchitis   . Allergic rhinitis, cause unspecified   . Anemia   . Anxiety   . B12 deficiency   . BV (bacterial vaginosis) 06/22/1996  . Calculus of kidney   . Calculus of kidney   . Coronary atherosclerosis of unspecified type of vessel, native or graft   . Dizziness   . Essential hypertension, benign   . Fibroid 2003  . Fibromyalgia   . H/O dysmenorrhea 2008  . H/O varicella   . Headache(784.0)    frequently  . HSV-2 infection 2009  . Hyperplastic colon polyp 05/16/2014  . Irritable bowel syndrome   . Meniere's disease, unspecified   . Menses, irregular 2003  . Myalgia and myositis, unspecified   . Obstructive sleep apnea (adult) (pediatric)   . Perimenopausal symptoms 2003  . Pure hypercholesterolemia   . Sarcoidosis   . Type II or unspecified type diabetes mellitus without mention of complication, not stated as uncontrolled   . Unspecified venous (peripheral) insufficiency   . Vitamin D deficiency   . Vulvitis 2010   . Yeast infection     Past Surgical History:  Procedure Laterality Date  . COLONOSCOPY    . CORONARY STENT INTERVENTION N/A 06/15/2017   Procedure: CORONARY STENT INTERVENTION;  Surgeon: Jettie Booze, MD;  Location: [place] [facility];  Service: Cardiovascular;  Laterality: N/A;  . heart catherization    . LEFT HEART CATH AND CORONARY ANGIOGRAPHY N/A 06/15/2017   Procedure: LEFT HEART CATH AND CORONARY ANGIOGRAPHY;  Surgeon: Jettie Booze, MD;  Location: [place] [facility];  Service: Cardiovascular;  Laterality: N/A;  . TONSILLECTOMY      There were no vitals filed for this visit.  Subjective Assessment - 02/20/20 1504    Subjective  bilat knee pain overall getting a littel better 4/10 overall.    How long can you stand comfortably?  hour at most    How long can you walk comfortably?  30 min    Diagnostic tests  XR Bilat knee OA right greater than left knee.    Patient Stated Goals  be able to walk with less pain    Pain Onset  More than a month ago         [facility] PT Assessment - 02/20/20 0001      Assessment   Medical Diagnosis  Bilat knee OA  right greater than left knee.    Referring Provider (PT)  Leandrew Koyanagi, MD    Next MD Visit  PRN      AROM   Right Knee Extension  0    Right Knee Flexion  115    Left Knee Extension  0    Left Knee Flexion  125      Strength   Overall Strength Comments  hip strength 4+/5 grossly bilat, knee strength 5-/5 bilat, all tested in sitting                   [facility] Adult PT Treatment/Exercise - 02/20/20 0001      Knee/Hip Exercises: Stretches   Active Hamstring Stretch  2 reps;30 seconds;Right;Left    Active Hamstring Stretch Limitations  seated    Knee: Self-Stretch Limitations  seated heelslides with self OP 10 sec X 10 reps bilat    Gastroc Stretch Limitations  slantboard 2X30 sec      Knee/Hip Exercises: Aerobic   Nustep  8 min L6 UE/LE      Knee/Hip Exercises: Machines for Strengthening   Total Gym  Leg Press  bilat leg press 62 lbs 2X10      Knee/Hip Exercises: Standing   Knee Flexion  Both;15 reps    Knee Flexion Limitations  5    Hip Flexion  2 sets    Hip Abduction  Both;15 reps    Abduction Limitations  5    Other Standing Knee Exercises  step ups on 6 inch step X 15 reps bilal fwd    Other Standing Knee Exercises  marches with 5 lbs 15 reps bilat      Knee/Hip Exercises: Seated   Long Arc Quad  Both;15 reps    Long Arc Quad Weight  5 lbs.      Iontophoresis   Type of Iontophoresis  Dexamethasone    Location  bilat medial knee at joint line    Dose  1.0 CC    Time  4-6 hour wear home patch                  PT Long Term Goals - 02/20/20 1518      PT LONG TERM GOAL #1   Title  Pt will be I and compliant with HEP. (Target for all goals 6 week 01/08/20)    Status  Achieved      PT LONG TERM GOAL #2   Title  Pt will improve hip strength to 4+ and knee strength to 5/5 MMT in sitting to show improved strength for functional abilites.    Status  Partially Met      PT LONG TERM GOAL #3   Title  Pt will improve bilat knee AROM to >125 deg to improve function    Baseline  met for Lt leg but not for Rt    Status  Partially Met      PT LONG TERM GOAL #4   Title  Pt willl report overall less than 3-4 pain with ususal activity.    Status  Partially Met            Plan - 02/20/20 1547    Clinical Impression Statement  She has made fair progress with PT, has decresaed overall pain levels, has increased her strength and ROM in her knees but still with mild deficits in this along with pain with prolonged walking or stairs. She will continue to benefit from skilled PT.  Personal Factors and Comorbidities  Time since onset of injury/illness/exacerbation;Comorbidity 1    Comorbidities  knee OA    Examination-Activity Limitations  Bend;Squat;Stairs;Stand;Lift;Locomotion Level    Examination-Participation Restrictions  Cleaning;Community Activity;Shop;Laundry     Stability/Clinical Decision Making  Evolving/Moderate complexity    Rehab Potential  Good    PT Frequency  2x / week   1-2   PT Treatment/Interventions  ADLs/Self Care Home Management;Aquatic Therapy;Cryotherapy;Electrical Stimulation;Iontophoresis 47m/ml Dexamethasone;Moist Heat;Ultrasound;Stair training;Therapeutic activities;Therapeutic exercise;Balance training;Neuromuscular re-education;Manual techniques;Passive range of motion;Dry needling;Joint Manipulations;Taping    PT Next Visit Plan  needs hip/knee strength and progression of standing activity as able    PT Home Exercise Plan  Access Code: [email or web address]    Consulted and Agree with Plan of Care  Patient       Patient will benefit from skilled therapeutic intervention in order to improve the following deficits and impairments:  Abnormal gait, Decreased strength, Decreased range of motion, Difficulty walking, Impaired flexibility, Pain  Visit Diagnosis: Chronic pain of right knee  Chronic pain of left knee  Muscle weakness (generalized)     Problem List Patient Active Problem List   Diagnosis Date Noted  . Hx of pleural effusion   . Dyspnea 08/16/2018  . Fall on or from sidewalk curb, initial encounter 05/31/2018  . History of acute inferior wall MI 08/22/2017  . Diabetes mellitus ([place]   . S/P right coronary artery (RCA) stent placement   . Overweight 05/05/2017  . Diabetes mellitus type 2, controlled, without complications ([place] 073/42/8768 . Asthmatic bronchitis , chronic ([place] 05/10/2016  . Polymyositis ([place] 09/15/2015  . History of sarcoidosis 09/15/2015  . Falling 03/13/2015  . Edema 02/13/2014  . Coronary artery disease involving native heart without angina pectoris 10/29/2013  . Hyperlipidemia 10/29/2013  . Iron (Fe) deficiency anemia 08/08/2013  . Vitamin B 12 deficiency 08/08/2013  . GERD (gastroesophageal reflux disease) 09/08/2011  . Weakness 02/22/2011  . Depression 02/22/2011  . [place]  04/30/2009  . Vitamin D deficiency 02/05/2009  . Venous (peripheral) insufficiency 12/12/2007  . RENAL CALCULUS 12/12/2007  . DIZZINESS 12/12/2007  . HYPERCHOLESTEROLEMIA 09/16/2007  . Anxiety state 09/16/2007  . HYPERTENSION, BENIGN 09/16/2007  . Allergic rhinitis 09/16/2007  . IRRITABLE BOWEL SYNDROME 09/16/2007  . Myalgia and myositis 09/16/2007    BDebbe [name] PT,DPT 02/20/2020, 3:53 PM  [facility] [address] [address]Phone: [phone]  Fax:  [phone] Name: DMICHELENE KENISTONMRN: [id number] of Birth: 1Jul 01, 1957

## 2020-02-26 ENCOUNTER — Other Ambulatory Visit: Payer: Self-pay

## 2020-02-26 MED ORDER — ICOSAPENT ETHYL 1 G PO CAPS: 2.0000 g | ORAL_CAPSULE | Freq: Two times a day (BID) | ORAL | 10 refills | Status: DC

## 2020-02-27 ENCOUNTER — Ambulatory Visit: Payer: Medicare PPO | Admitting: Physical Therapy

## 2020-02-27 ENCOUNTER — Other Ambulatory Visit: Payer: Self-pay

## 2020-02-27 DIAGNOSIS — G8929 Other chronic pain: Secondary | ICD-10-CM

## 2020-02-27 DIAGNOSIS — M6281 Muscle weakness (generalized): Secondary | ICD-10-CM

## 2020-02-27 DIAGNOSIS — M25561 Pain in right knee: Secondary | ICD-10-CM | POA: Diagnosis not present

## 2020-02-27 DIAGNOSIS — M25562 Pain in left knee: Secondary | ICD-10-CM | POA: Diagnosis not present

## 2020-02-27 NOTE — Therapy (Signed)
[place], [place], [address] Phone: [phone]   Fax:  [phone]  Physical Therapy Treatment  Patient Details  Name: Jocelyn [name] MRN: [id number] Date of Birth: 12-Oct-1956 Referring Provider (PT): Leandrew Koyanagi, MD   Encounter Date: 02/27/2020  PT End of Session - 02/27/20 1642    Visit Number  11    Number of Visits  12   recert on 8/67 to extend 4 more visits in 5 weeks   Date for PT Re-Evaluation  02/27/20    Authorization Type  [employer] MCR    Progress Note Due on Visit  20    PT Start Time  6195    PT Stop Time  1535    PT Time Calculation (min)  50 min    Activity Tolerance  Patient tolerated treatment well    Behavior During Therapy  [facility] for tasks assessed/performed       Past Medical History:  Diagnosis Date  . Acute bronchitis   . Allergic rhinitis, cause unspecified   . Anemia   . Anxiety   . B12 deficiency   . BV (bacterial vaginosis) 06/22/1996  . Calculus of kidney   . Calculus of kidney   . Coronary atherosclerosis of unspecified type of vessel, native or graft   . Dizziness   . Essential hypertension, benign   . Fibroid 2003  . Fibromyalgia   . H/O dysmenorrhea 2008  . H/O varicella   . Headache(784.0)    frequently  . HSV-2 infection 2009  . Hyperplastic colon polyp 05/16/2014  . Irritable bowel syndrome   . Meniere's disease, unspecified   . Menses, irregular 2003  . Myalgia and myositis, unspecified   . Obstructive sleep apnea (adult) (pediatric)   . Perimenopausal symptoms 2003  . Pure hypercholesterolemia   . Sarcoidosis   . Type II or unspecified type diabetes mellitus without mention of complication, not stated as uncontrolled   . Unspecified venous (peripheral) insufficiency   . Vitamin D deficiency   . Vulvitis 2010  . Yeast infection     Past Surgical History:  Procedure Laterality Date  . COLONOSCOPY    . CORONARY STENT INTERVENTION N/A 06/15/2017   Procedure:  CORONARY STENT INTERVENTION;  Surgeon: Jettie Booze, MD;  Location: [place] [facility];  Service: Cardiovascular;  Laterality: N/A;  . heart catherization    . LEFT HEART CATH AND CORONARY ANGIOGRAPHY N/A 06/15/2017   Procedure: LEFT HEART CATH AND CORONARY ANGIOGRAPHY;  Surgeon: Jettie Booze, MD;  Location: [place] [facility];  Service: Cardiovascular;  Laterality: N/A;  . TONSILLECTOMY      There were no vitals filed for this visit.  Subjective Assessment - 02/27/20 1637    Subjective  Rt knee 4/10, Lt knee 2/10. I think a knee brace might help.    How long can you stand comfortably?  hour at most    How long can you walk comfortably?  30 min    Diagnostic tests  XR Bilat knee OA right greater than left knee.    Patient Stated Goals  be able to walk with less pain    Pain Onset  More than a month ago                       [facility] Adult PT Treatment/Exercise - 02/27/20 0001      Knee/Hip Exercises: Stretches   Active Hamstring Stretch  2 reps;30 seconds;Right;Left  Active Hamstring Stretch Limitations  seated    Knee: Self-Stretch Limitations  seated heelslides with self OP 10 sec X 10 reps bilat    Gastroc Stretch Limitations  slantboard 2X30 sec      Knee/Hip Exercises: Aerobic   Nustep  8 min L6 UE/LE      Knee/Hip Exercises: Machines for Strengthening   Total Gym Leg Press  bilat leg press 50 lbs 3X10      Knee/Hip Exercises: Standing   Knee Flexion  Both;15 reps    Knee Flexion Limitations  5    Hip Flexion  2 sets      Knee/Hip Exercises: Seated   Long Arc Quad  Both;15 reps    Long Arc Quad Weight  5 lbs.      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 (Target for all goals 6 week 01/08/20)    Status  Achieved      PT LONG TERM GOAL #2   Title  Pt will improve hip strength to 4+ and knee strength to 5/5 MMT in sitting to show improved strength for functional abilites.    Status  Partially Met      PT LONG TERM GOAL #3   Title  Pt will improve bilat knee AROM to >125 deg to improve function    Baseline  met for Lt leg but not for Rt    Status  Partially Met      PT LONG TERM GOAL #4   Title  Pt willl report overall less than 3-4 pain with ususal activity.    Status  Partially Met            Plan - 02/27/20 1642    Clinical Impression Statement  She asked about knee brace so PT provided print out of one she can order off of [employer] that might help with pain as well as recommended voltaren gel for pain. Used TENS today at her request to reduce knee pain post workout. Continue POC    Personal Factors and Comorbidities  Time since onset of injury/illness/exacerbation;Comorbidity 1    Comorbidities  knee OA    Examination-Activity Limitations  Bend;Squat;Stairs;Stand;Lift;Locomotion Level    Examination-Participation Restrictions  Cleaning;Community Activity;Shop;Laundry    Stability/Clinical Decision Making  Evolving/Moderate complexity    Rehab Potential  Good    PT Frequency  2x / week   1-2   PT Treatment/Interventions  ADLs/Self Care Home Management;Aquatic Therapy;Cryotherapy;Electrical Stimulation;Iontophoresis 40m/ml Dexamethasone;Moist Heat;Ultrasound;Stair training;Therapeutic activities;Therapeutic exercise;Balance training;Neuromuscular re-education;Manual techniques;Passive range of motion;Dry needling;Joint Manipulations;Taping    PT Next Visit Plan  needs hip/knee strength and progression of standing activity as able    PT Home  Exercise Plan  Access Code: [email or web address]    Consulted and Agree with Plan of Care  Patient       Patient will benefit from skilled therapeutic intervention  in order to improve the following deficits and impairments:  Abnormal gait, Decreased strength, Decreased range of motion, Difficulty walking, Impaired flexibility, Pain  Visit Diagnosis: Chronic pain of right knee  Chronic pain of left knee  Muscle weakness (generalized)     Problem List Patient Active Problem List   Diagnosis Date Noted  . Hx of pleural effusion   . Dyspnea 08/16/2018  . Fall on or from sidewalk curb, initial encounter 05/31/2018  . History of acute inferior wall MI 08/22/2017  . Diabetes mellitus ([place])   . S/P right coronary artery (RCA) stent placement   . Overweight 05/05/2017  . Diabetes mellitus type 2, controlled, without complications ([place]) 59/96/8957  . Asthmatic bronchitis , chronic ([place]) 05/10/2016  . Polymyositis ([place]) 09/15/2015  . History of sarcoidosis 09/15/2015  . Falling 03/13/2015  . Edema 02/13/2014  . Coronary artery disease involving native heart without angina pectoris 10/29/2013  . Hyperlipidemia 10/29/2013  . Iron (Fe) deficiency anemia 08/08/2013  . Vitamin B 12 deficiency 08/08/2013  . GERD (gastroesophageal reflux disease) 09/08/2011  . Weakness 02/22/2011  . Depression 02/22/2011  . [place] DISEASE 04/30/2009  . Vitamin D deficiency 02/05/2009  . Venous (peripheral) insufficiency 12/12/2007  . RENAL CALCULUS 12/12/2007  . DIZZINESS 12/12/2007  . HYPERCHOLESTEROLEMIA 09/16/2007  . Anxiety state 09/16/2007  . HYPERTENSION, BENIGN 09/16/2007  . Allergic rhinitis 09/16/2007  . IRRITABLE BOWEL SYNDROME 09/16/2007  . Myalgia and myositis 09/16/2007    Silvestre Mesi 02/27/2020, 4:45 PM  [facility] Physical Therapy [address] [place], [place], [address] Phone: [phone]   Fax:  [phone]  Name: Jocelyn [name] MRN: [id number] Date of  Birth: 1956/06/04

## 2020-03-05 ENCOUNTER — Encounter: Payer: Medicare PPO | Admitting: Physical Therapy

## 2020-03-11 DIAGNOSIS — E669 Obesity, unspecified: Secondary | ICD-10-CM | POA: Diagnosis not present

## 2020-03-11 DIAGNOSIS — M3322 Polymyositis with myopathy: Secondary | ICD-10-CM | POA: Diagnosis not present

## 2020-03-11 DIAGNOSIS — M545 Low back pain: Secondary | ICD-10-CM | POA: Diagnosis not present

## 2020-03-11 DIAGNOSIS — Z6831 Body mass index (BMI) 31.0-31.9, adult: Secondary | ICD-10-CM | POA: Diagnosis not present

## 2020-03-11 DIAGNOSIS — Z79899 Other long term (current) drug therapy: Secondary | ICD-10-CM | POA: Diagnosis not present

## 2020-03-11 DIAGNOSIS — M25561 Pain in right knee: Secondary | ICD-10-CM | POA: Diagnosis not present

## 2020-03-11 DIAGNOSIS — J948 Other specified pleural conditions: Secondary | ICD-10-CM | POA: Diagnosis not present

## 2020-03-12 ENCOUNTER — Encounter: Payer: Self-pay | Admitting: Physical Therapy

## 2020-03-12 ENCOUNTER — Ambulatory Visit: Payer: Medicare PPO | Admitting: Physical Therapy

## 2020-03-12 ENCOUNTER — Other Ambulatory Visit: Payer: Self-pay

## 2020-03-12 DIAGNOSIS — M25561 Pain in right knee: Secondary | ICD-10-CM

## 2020-03-12 DIAGNOSIS — M6281 Muscle weakness (generalized): Secondary | ICD-10-CM

## 2020-03-12 DIAGNOSIS — G8929 Other chronic pain: Secondary | ICD-10-CM | POA: Diagnosis not present

## 2020-03-12 DIAGNOSIS — M25562 Pain in left knee: Secondary | ICD-10-CM | POA: Diagnosis not present

## 2020-03-12 NOTE — Patient Instructions (Signed)
Access Code: [email or web address] URL: [email or web address] Date: 03/12/2020 Prepared by: Elsie Ra  Exercises .Seated Hamstring Stretch - 1 x daily - 6 x weekly - 1 sets - 2 reps - 30 hold .Supine Quadriceps Stretch with Strap on Table - 2 x daily - 6 x weekly - 2 sets - 30 hold .Supine Bridge - 2 x daily - 6 x weekly - 10 reps - 1-2 sets - 5 hold .Supine Active Straight Leg Raise - 2 x daily - 6 x weekly - 10 reps - 1-2 sets .Sitting Knee Extension with Resistance - 2 x daily - 6 x weekly - 10 reps - 1-2 sets .Seated Hamstring Curls with Resistance - 2 x daily - 6 x weekly - 10 reps - 1-2 sets .Side Stepping with Resistance at Thighs - 1 x daily - 6 x weekly - 2 sets - 10 reps .Sit to Stand without Arm Support - 2 x daily - 6 x weekly - 10 reps - 1-2 sets

## 2020-03-12 NOTE — Therapy (Signed)
[place], [place], [address] Phone: [phone]   Fax:  [phone]  Physical Therapy Treatment/Recert  Patient Details  Name: Jocelyn [name] MRN: [id number] Date of Birth: Aug 13, 1956 Referring Provider (PT): Leandrew Koyanagi, MD   Encounter Date: 03/12/2020  PT End of Session - 03/12/20 1541    Visit Number  12    Number of Visits  16   recert on 2/44 to extend 4 more visits in 5 weeks   Date for PT Re-Evaluation  04/09/20    Authorization Type  [employer] MCR    Progress Note Due on Visit  20    PT Start Time  0102    PT Stop Time  1532    PT Time Calculation (min)  45 min    Activity Tolerance  Patient tolerated treatment well    Behavior During Therapy  [facility] for tasks assessed/performed       Past Medical History:  Diagnosis Date  . Acute bronchitis   . Allergic rhinitis, cause unspecified   . Anemia   . Anxiety   . B12 deficiency   . BV (bacterial vaginosis) 06/22/1996  . Calculus of kidney   . Calculus of kidney   . Coronary atherosclerosis of unspecified type of vessel, native or graft   . Dizziness   . Essential hypertension, benign   . Fibroid 2003  . Fibromyalgia   . H/O dysmenorrhea 2008  . H/O varicella   . Headache(784.0)    frequently  . HSV-2 infection 2009  . Hyperplastic colon polyp 05/16/2014  . Irritable bowel syndrome   . Meniere's disease, unspecified   . Menses, irregular 2003  . Myalgia and myositis, unspecified   . Obstructive sleep apnea (adult) (pediatric)   . Perimenopausal symptoms 2003  . Pure hypercholesterolemia   . Sarcoidosis   . Type II or unspecified type diabetes mellitus without mention of complication, not stated as uncontrolled   . Unspecified venous (peripheral) insufficiency   . Vitamin D deficiency   . Vulvitis 2010  . Yeast infection     Past Surgical History:  Procedure Laterality Date  . COLONOSCOPY    . CORONARY STENT INTERVENTION N/A 06/15/2017   Procedure: CORONARY STENT INTERVENTION;  Surgeon: Jettie Booze, MD;  Location: [place];  Service: Cardiovascular;  Laterality: N/A;  . heart catherization    . LEFT HEART CATH AND CORONARY ANGIOGRAPHY N/A 06/15/2017   Procedure: LEFT HEART CATH AND CORONARY ANGIOGRAPHY;  Surgeon: Jettie Booze, MD;  Location: [place] [facility];  Service: Cardiovascular;  Laterality: N/A;  . TONSILLECTOMY      There were no vitals filed for this visit.  Subjective Assessment - 03/12/20 1538    Subjective  Knee pain is overall 6/10 in her Rt knee and 4/10 in Left knee. She got knee brace for her Rt knee but not sure how it goes on. Wishes to Continue PT for a few more sessions as she stil feels she needs to work on strengthening    How long can you stand comfortably?  hour at most    How long can you walk comfortably?  30 min    Diagnostic tests  XR Bilat knee OA right greater than left knee.    Patient Stated Goals  be able to walk with less pain    Pain Onset  More than a month ago         [facility] PT Assessment - 03/12/20 0001  Assessment   Medical Diagnosis  Bilat knee OA right greater than left knee.    Referring Provider (PT)  Leandrew Koyanagi, MD    Next MD Visit  PRN      AROM   Right Knee Extension  0    Right Knee Flexion  125    Left Knee Extension  0    Left Knee Flexion  135      Strength   Overall Strength Comments  hip strength 4+/5 grossly bilat, knee strength 5-/5 bilat, all tested in sitting                   [facility] Adult PT Treatment/Exercise - 03/12/20 0001      Knee/Hip Exercises: Stretches   Active Hamstring Stretch  2 reps;30 seconds;Right;Left    Active Hamstring Stretch Limitations  seated    Quad Stretch  Right;Left;2 reps;30 seconds      Knee/Hip Exercises: Aerobic   Nustep  6 min L6 UE/LE      Knee/Hip Exercises: Standing   Other Standing Knee Exercises  lateral walking with red band around knees 30 ft up/down X 1       Knee/Hip Exercises: Seated   Long Arc Quad Limitations  bilat with red band X 10 reps ea    Other Seated Knee/Hip Exercises  hamstring curl with red band X 10 reps bilat    Sit to Sand  2 sets;10 reps;without UE support             PT Education - 03/12/20 1540    Education Details  showed her how to don/doff knee brace correctly and to only use it when standing/walking a lot but not to use all the time, also revised HEP    Person(s) Educated  Patient    Methods  Explanation;Demonstration;Verbal cues;Handout    Comprehension  Verbalized understanding;Returned demonstration          PT Long Term Goals - 03/12/20 1512      PT LONG TERM GOAL #1   Title  Pt will be I and compliant with HEP. (Target for all goals 6 week 01/08/20)    Baseline  revised today    Status  On-going      PT LONG TERM GOAL #2   Title  Pt will improve hip strength to 4+ and knee strength to 5/5 MMT in sitting to show improved strength for functional abilites.    Baseline  hip is 4+, knee is 5-    Status  Partially Met      PT LONG TERM GOAL #3   Title  Pt will improve bilat knee AROM to >125 deg to improve function    Baseline  met 4/28    Status  Achieved      PT LONG TERM GOAL #4   Title  Pt willl report overall less than 3-4 pain with ususal activity.    Baseline  6 on avg    Status  Partially Met            Plan - 03/12/20 1542    Clinical Impression Statement  She has met 1/4 long term goals but has showed improvements in overall knee strength, knee ROM, and hip/knee strength. She does still have deficits in these areas and PT recommending 4 more weeks of PT at one time per week to maximize function and transition to final HEP progression.    Personal Factors and Comorbidities  Time since onset of injury/illness/exacerbation;Comorbidity 1  Comorbidities  knee OA    Examination-Activity Limitations  Bend;Squat;Stairs;Stand;Lift;Locomotion Level    Examination-Participation Restrictions   Cleaning;Community Activity;Shop;Laundry    Stability/Clinical Decision Making  Evolving/Moderate complexity    Rehab Potential  Good    PT Frequency  2x / week   1-2   PT Treatment/Interventions  ADLs/Self Care Home Management;Aquatic Therapy;Cryotherapy;Electrical Stimulation;Iontophoresis 60m/ml Dexamethasone;Moist Heat;Ultrasound;Stair training;Therapeutic activities;Therapeutic exercise;Balance training;Neuromuscular re-education;Manual techniques;Passive range of motion;Dry needling;Joint Manipulations;Taping    PT Next Visit Plan  how was knee brace, how was new HEP    PT Home Exercise Plan  Access Code: [email or web address]    Consulted and Agree with Plan of Care  Patient       Patient will benefit from skilled therapeutic intervention in order to improve the following deficits and impairments:  Abnormal gait, Decreased strength, Decreased range of motion, Difficulty walking, Impaired flexibility, Pain  Visit Diagnosis: Chronic pain of right knee  Chronic pain of left knee  Muscle weakness (generalized)     Problem List Patient Active Problem List   Diagnosis Date Noted  . Hx of pleural effusion   . Dyspnea 08/16/2018  . Fall on or from sidewalk curb, initial encounter 05/31/2018  . History of acute inferior wall MI 08/22/2017  . Diabetes mellitus ([place]   . S/P right coronary artery (RCA) stent placement   . Overweight 05/05/2017  . Diabetes mellitus type 2, controlled, without complications ([place] 076/14/7092 . Asthmatic bronchitis , chronic ([place] 05/10/2016  . Polymyositis ([place] 09/15/2015  . History of sarcoidosis 09/15/2015  . Falling 03/13/2015  . Edema 02/13/2014  . Coronary artery disease involving native heart without angina pectoris 10/29/2013  . Hyperlipidemia 10/29/2013  . Iron (Fe) deficiency anemia 08/08/2013  . Vitamin B 12 deficiency 08/08/2013  . GERD (gastroesophageal reflux disease) 09/08/2011  . Weakness 02/22/2011  . Depression 02/22/2011  . [place] DISEASE 04/30/2009  . Vitamin D deficiency 02/05/2009  . Venous (peripheral) insufficiency 12/12/2007  . RENAL CALCULUS 12/12/2007  . DIZZINESS 12/12/2007  . HYPERCHOLESTEROLEMIA 09/16/2007  . Anxiety state 09/16/2007  . HYPERTENSION, BENIGN 09/16/2007  . Allergic rhinitis 09/16/2007  . IRRITABLE BOWEL SYNDROME 09/16/2007  . Myalgia and myositis 09/16/2007    BSilvestre Mesi4/28/2021, 3:45 PM  [facility] [address] [address]Phone: [phone]  Fax:  [phone] Name: DIVONE LICHTMRN: [id number] of Birth: 115-Mar-1957

## 2020-03-18 DIAGNOSIS — E538 Deficiency of other specified B group vitamins: Secondary | ICD-10-CM | POA: Diagnosis not present

## 2020-03-18 DIAGNOSIS — E669 Obesity, unspecified: Secondary | ICD-10-CM | POA: Diagnosis not present

## 2020-03-18 DIAGNOSIS — E119 Type 2 diabetes mellitus without complications: Secondary | ICD-10-CM | POA: Diagnosis not present

## 2020-03-18 DIAGNOSIS — E611 Iron deficiency: Secondary | ICD-10-CM | POA: Diagnosis not present

## 2020-03-18 DIAGNOSIS — D3502 Benign neoplasm of left adrenal gland: Secondary | ICD-10-CM | POA: Diagnosis not present

## 2020-03-18 DIAGNOSIS — Z8639 Personal history of other endocrine, nutritional and metabolic disease: Secondary | ICD-10-CM | POA: Diagnosis not present

## 2020-03-18 DIAGNOSIS — I2510 Atherosclerotic heart disease of native coronary artery without angina pectoris: Secondary | ICD-10-CM | POA: Diagnosis not present

## 2020-03-18 DIAGNOSIS — E559 Vitamin D deficiency, unspecified: Secondary | ICD-10-CM | POA: Diagnosis not present

## 2020-03-19 ENCOUNTER — Ambulatory Visit: Payer: Medicare PPO | Admitting: Physical Therapy

## 2020-03-19 ENCOUNTER — Other Ambulatory Visit: Payer: Self-pay

## 2020-03-19 DIAGNOSIS — G8929 Other chronic pain: Secondary | ICD-10-CM

## 2020-03-19 DIAGNOSIS — M25562 Pain in left knee: Secondary | ICD-10-CM | POA: Diagnosis not present

## 2020-03-19 DIAGNOSIS — M25561 Pain in right knee: Secondary | ICD-10-CM | POA: Diagnosis not present

## 2020-03-19 DIAGNOSIS — M6281 Muscle weakness (generalized): Secondary | ICD-10-CM

## 2020-03-20 ENCOUNTER — Encounter: Payer: Self-pay | Admitting: Physical Therapy

## 2020-03-20 NOTE — Therapy (Signed)
[place], [place], [address] Phone: [phone]   Fax:  [phone]  Physical Therapy Treatment  Patient Details  Name: Jocelyn [name] MRN: [id number] Date of Birth: [birth year] Referring Provider (PT): Leandrew Koyanagi, MD   Encounter Date: 03/19/2020  PT End of Session - 03/20/20 0836    Visit Number  13    Number of Visits  16    Date for PT Re-Evaluation  04/09/20    Authorization Type  [employer] MCR    Authorization - Visit Number  13    Authorization - Number of Visits  24   approaved 12 more visits until 5/26   Progress Note Due on Visit  20    PT Start Time  1515    PT Stop Time  1555    PT Time Calculation (min)  40 min    Activity Tolerance  Patient tolerated treatment well    Behavior During Therapy  [facility] for tasks assessed/performed       Past Medical History:  Diagnosis Date  . Acute bronchitis   . Allergic rhinitis, cause unspecified   . Anemia   . Anxiety   . B12 deficiency   . BV (bacterial vaginosis) 06/22/1996  . Calculus of kidney   . Calculus of kidney   . Coronary atherosclerosis of unspecified type of vessel, native or graft   . Dizziness   . Essential hypertension, benign   . Fibroid 2003  . Fibromyalgia   . H/O dysmenorrhea 2008  . H/O varicella   . Headache(784.0)    frequently  . HSV-2 infection 2009  . Hyperplastic colon polyp 05/16/2014  . Irritable bowel syndrome   . Meniere's disease, unspecified   . Menses, irregular 2003  . Myalgia and myositis, unspecified   . Obstructive sleep apnea (adult) (pediatric)   . Perimenopausal symptoms 2003  . Pure hypercholesterolemia   . Sarcoidosis   . Type II or unspecified type diabetes mellitus without mention of complication, not stated as uncontrolled   . Unspecified venous (peripheral) insufficiency   . Vitamin D deficiency   . Vulvitis 2010  . Yeast infection     Past Surgical History:  Procedure Laterality Date  . COLONOSCOPY     . CORONARY STENT INTERVENTION N/A 06/15/2017   Procedure: CORONARY STENT INTERVENTION;  Surgeon: Jettie Booze, MD;  Location: [place] [facility];  Service: Cardiovascular;  Laterality: N/A;  . heart catherization    . LEFT HEART CATH AND CORONARY ANGIOGRAPHY N/A 06/15/2017   Procedure: LEFT HEART CATH AND CORONARY ANGIOGRAPHY;  Surgeon: Jettie Booze, MD;  Location: [place] [facility];  Service: Cardiovascular;  Laterality: N/A;  . TONSILLECTOMY      There were no vitals filed for this visit.  Subjective Assessment - 03/20/20 0822    Subjective  relays the knee brace helps some and she only uses it for long times spent on her feet as recommended. Pain overal 4/10 today in her knees    How long can you stand comfortably?  hour at most    How long can you walk comfortably?  30 min    Diagnostic tests  XR Bilat knee OA right greater than left knee.    Patient Stated Goals  be able to walk with less pain    Pain Onset  More than a month ago         [facility] Adult PT Treatment/Exercise - 03/20/20 0001  Knee/Hip Exercises: Stretches   Active Hamstring Stretch  2 reps;30 seconds;Right;Left    Active Hamstring Stretch Limitations  seated    Quad Stretch  Right;Left;2 reps;30 seconds    Gastroc Stretch Limitations  slantboard 2X30 sec      Knee/Hip Exercises: Aerobic   Nustep  8 min L6 UE/LE      Knee/Hip Exercises: Machines for Strengthening   Total Gym Leg Press  bilat leg press 50 lbs 3X10      Knee/Hip Exercises: Standing   Knee Flexion  Both;2 sets;10 reps    Knee Flexion Limitations  3 lbs    Hip Abduction  Both;2 sets;10 reps    Abduction Limitations  3 lbs    Other Standing Knee Exercises  lateral walking with green band around knees 30 ft up/down X 1    Other Standing Knee Exercises  fwd step ups and lateral step ups X 10 bilat on 6 inch step, one UE support      Knee/Hip Exercises: Seated   Long Arc Quad  Both;2 sets;10 reps    Long Arc Quad Weight  3 lbs.                   PT Long Term Goals - 03/12/20 1512      PT LONG TERM GOAL #1   Title  Pt will be I and compliant with HEP. (Target for all goals 6 week 01/08/20)    Baseline  revised today    Status  On-going      PT LONG TERM GOAL #2   Title  Pt will improve hip strength to 4+ and knee strength to 5/5 MMT in sitting to show improved strength for functional abilites.    Baseline  hip is 4+, knee is 5-    Status  Partially Met      PT LONG TERM GOAL #3   Title  Pt will improve bilat knee AROM to >125 deg to improve function    Baseline  met 4/28    Status  Achieved      PT LONG TERM GOAL #4   Title  Pt willl report overall less than 3-4 pain with ususal activity.    Baseline  6 on avg    Status  Partially Met            Plan - 03/20/20 0837    Clinical Impression Statement  Insurance has authorized 12 more PT visits until 04/09/20 so she was encouraged to set up more appointments to continue to address her deficits in knee ROM, strength, and pain.    Personal Factors and Comorbidities  Time since onset of injury/illness/exacerbation;Comorbidity 1    Comorbidities  knee OA    Examination-Activity Limitations  Bend;Squat;Stairs;Stand;Lift;Locomotion Level    Examination-Participation Restrictions  Cleaning;Community Activity;Shop;Laundry    Stability/Clinical Decision Making  Evolving/Moderate complexity    Rehab Potential  Good    PT Frequency  2x / week   1-2   PT Treatment/Interventions  ADLs/Self Care Home Management;Aquatic Therapy;Cryotherapy;Electrical Stimulation;Iontophoresis 51m/ml Dexamethasone;Moist Heat;Ultrasound;Stair training;Therapeutic activities;Therapeutic exercise;Balance training;Neuromuscular re-education;Manual techniques;Passive range of motion;Dry needling;Joint Manipulations;Taping    PT Next Visit Plan  how was new HEP    PT Home Exercise Plan  Access Code: [email or web address]    Consulted and Agree with Plan of Care  Patient       Patient will  benefit from skilled therapeutic intervention in order to improve the following deficits and impairments:  Abnormal gait, Decreased strength, Decreased range  of motion, Difficulty walking, Impaired flexibility, Pain  Visit Diagnosis: Chronic pain of right knee  Chronic pain of left knee  Muscle weakness (generalized)     Problem List Patient Active Problem List   Diagnosis Date Noted  . Hx of pleural effusion   . Dyspnea 08/16/2018  . Fall on or from sidewalk curb, initial encounter 05/31/2018  . History of acute inferior wall MI 08/22/2017  . Diabetes mellitus ([place])   . S/P right coronary artery (RCA) stent placement   . Overweight 05/05/2017  . Diabetes mellitus type 2, controlled, without complications ([place]) 70/09/33  . Asthmatic bronchitis , chronic ([place]) 05/10/2016  . Polymyositis ([place]) 09/15/2015  . History of sarcoidosis 09/15/2015  . Falling 03/13/2015  . Edema 02/13/2014  . Coronary artery disease involving native heart without angina pectoris 10/29/2013  . Hyperlipidemia 10/29/2013  . Iron (Fe) deficiency anemia 08/08/2013  . Vitamin B 12 deficiency 08/08/2013  . GERD (gastroesophageal reflux disease) 09/08/2011  . Weakness 02/22/2011  . Depression 02/22/2011  . [place] DISEASE 04/30/2009  . Vitamin D deficiency 02/05/2009  . Venous (peripheral) insufficiency 12/12/2007  . RENAL CALCULUS 12/12/2007  . DIZZINESS 12/12/2007  . HYPERCHOLESTEROLEMIA 09/16/2007  . Anxiety state 09/16/2007  . HYPERTENSION, BENIGN 09/16/2007  . Allergic rhinitis 09/16/2007  . IRRITABLE BOWEL SYNDROME 09/16/2007  . Myalgia and myositis 09/16/2007    Silvestre Mesi 03/20/2020, 8:46 AM  [facility] Physical Therapy [address] [place], [place], [address] Phone: [phone]   Fax:  [phone]  Name: Jocelyn [name] MRN: [id number] Date of Birth: Aug 31, 1956

## 2020-03-24 ENCOUNTER — Other Ambulatory Visit: Payer: Self-pay

## 2020-03-24 MED ORDER — NITROGLYCERIN 0.4 MG SL SUBL: 0.4000 mg | SUBLINGUAL_TABLET | SUBLINGUAL | 2 refills | Status: DC | PRN

## 2020-03-26 ENCOUNTER — Encounter: Payer: Self-pay | Admitting: Physical Therapy

## 2020-03-26 ENCOUNTER — Other Ambulatory Visit: Payer: Self-pay

## 2020-03-26 ENCOUNTER — Ambulatory Visit: Payer: Medicare PPO | Admitting: Physical Therapy

## 2020-03-26 DIAGNOSIS — G8929 Other chronic pain: Secondary | ICD-10-CM | POA: Diagnosis not present

## 2020-03-26 DIAGNOSIS — M25561 Pain in right knee: Secondary | ICD-10-CM | POA: Diagnosis not present

## 2020-03-26 DIAGNOSIS — M25562 Pain in left knee: Secondary | ICD-10-CM

## 2020-03-26 DIAGNOSIS — M6281 Muscle weakness (generalized): Secondary | ICD-10-CM | POA: Diagnosis not present

## 2020-03-26 NOTE — Therapy (Signed)
[place], [place], [address] Phone: [phone]   Fax:  [phone]  Physical Therapy Treatment  Patient Details  Name: Jocelyn [name] MRN: [id number] Date of Birth: Mar 18, 1956 Referring Provider (PT): Leandrew Koyanagi, MD   Encounter Date: 03/26/2020  PT End of Session - 03/26/20 2032    Visit Number  14    Number of Visits  16    Date for PT Re-Evaluation  04/09/20    Authorization Type  [employer] MCR    Authorization - Visit Number  14    Authorization - Number of Visits  24   approaved 12 more visits until 5/26   Progress Note Due on Visit  20    PT Start Time  1515    PT Stop Time  1557    PT Time Calculation (min)  42 min    Activity Tolerance  Patient tolerated treatment well    Behavior During Therapy  [facility] for tasks assessed/performed       Past Medical History:  Diagnosis Date  . Acute bronchitis   . Allergic rhinitis, cause unspecified   . Anemia   . Anxiety   . B12 deficiency   . BV (bacterial vaginosis) 06/22/1996  . Calculus of kidney   . Calculus of kidney   . Coronary atherosclerosis of unspecified type of vessel, native or graft   . Dizziness   . Essential hypertension, benign   . Fibroid 2003  . Fibromyalgia   . H/O dysmenorrhea 2008  . H/O varicella   . Headache(784.0)    frequently  . HSV-2 infection 2009  . Hyperplastic colon polyp 05/16/2014  . Irritable bowel syndrome   . Meniere's disease, unspecified   . Menses, irregular 2003  . Myalgia and myositis, unspecified   . Obstructive sleep apnea (adult) (pediatric)   . Perimenopausal symptoms 2003  . Pure hypercholesterolemia   . Sarcoidosis   . Type II or unspecified type diabetes mellitus without mention of complication, not stated as uncontrolled   . Unspecified venous (peripheral) insufficiency   . Vitamin D deficiency   . Vulvitis 2010  . Yeast infection     Past Surgical History:  Procedure Laterality Date  . COLONOSCOPY     . CORONARY STENT INTERVENTION N/A 06/15/2017   Procedure: CORONARY STENT INTERVENTION;  Surgeon: Jettie Booze, MD;  Location: [place] [facility];  Service: Cardiovascular;  Laterality: N/A;  . heart catherization    . LEFT HEART CATH AND CORONARY ANGIOGRAPHY N/A 06/15/2017   Procedure: LEFT HEART CATH AND CORONARY ANGIOGRAPHY;  Surgeon: Jettie Booze, MD;  Location: [place] [facility];  Service: Cardiovascular;  Laterality: N/A;  . TONSILLECTOMY      There were no vitals filed for this visit.  Subjective Assessment - 03/26/20 1541    Subjective  relays she walked some with semi heel shoes for mothers day brunch and this aggravated her knees some. Overall 6/10 pain today.    How long can you stand comfortably?  hour at most    How long can you walk comfortably?  30 min    Diagnostic tests  XR Bilat knee OA right greater than left knee.    Patient Stated Goals  be able to walk with less pain    Pain Onset  More than a month ago                        [facility] Adult  PT Treatment/Exercise - 03/26/20 0001      Knee/Hip Exercises: Stretches   Quad Stretch  Right;Left;2 reps;30 seconds    Quad Stretch Limitations  standing with strap    Press photographer Limitations  slantboard 2X30 sec      Knee/Hip Exercises: Aerobic   Nustep  11 min L3 UE/LE      Knee/Hip Exercises: Machines for Strengthening   Total Gym Leg Press  bilat leg press 50 lbs 3X10, then dropped to SL 25 lbs X 15 reps bilat      Knee/Hip Exercises: Standing   Knee Flexion  Both;2 sets;10 reps    Knee Flexion Limitations  3 lbs    Hip Abduction  Both;2 sets;10 reps    Abduction Limitations  3 lbs    Forward Step Up  Both;10 reps;Hand Hold: 1;Step Height: 6"      Knee/Hip Exercises: Seated   Long Arc Quad  Both;2 sets;10 reps    Long Arc Quad Weight  3 lbs.      Modalities   Modalities  Iontophoresis      Iontophoresis   Type of Iontophoresis  Dexamethasone    Location  bilat medial knee  at joint line    Dose  1.0 CC    Time  4-6 hour wear home patch                  PT Long Term Goals - 03/12/20 1512      PT LONG TERM GOAL #1   Title  Pt will be I and compliant with HEP. (Target for all goals 6 week 01/08/20)    Baseline  revised today    Status  On-going      PT LONG TERM GOAL #2   Title  Pt will improve hip strength to 4+ and knee strength to 5/5 MMT in sitting to show improved strength for functional abilites.    Baseline  hip is 4+, knee is 5-    Status  Partially Met      PT LONG TERM GOAL #3   Title  Pt will improve bilat knee AROM to >125 deg to improve function    Baseline  met 4/28    Status  Achieved      PT LONG TERM GOAL #4   Title  Pt willl report overall less than 3-4 pain with ususal activity.    Baseline  6 on avg    Status  Partially Met            Plan - 03/26/20 2033    Clinical Impression Statement  More pain today after wearing heels over the weekend. Used ionto at end of session to calm down pain and inflammation. Lt knee is overall doing much better but Rt knee is limited more by pain and has less overall strength.    Personal Factors and Comorbidities  Time since onset of injury/illness/exacerbation;Comorbidity 1    Comorbidities  knee OA    Examination-Activity Limitations  Bend;Squat;Stairs;Stand;Lift;Locomotion Level    Examination-Participation Restrictions  Cleaning;Community Activity;Shop;Laundry    Stability/Clinical Decision Making  Evolving/Moderate complexity    Rehab Potential  Good    PT Frequency  2x / week   1-2   PT Treatment/Interventions  ADLs/Self Care Home Management;Aquatic Therapy;Cryotherapy;Electrical Stimulation;Iontophoresis 52m/ml Dexamethasone;Moist Heat;Ultrasound;Stair training;Therapeutic activities;Therapeutic exercise;Balance training;Neuromuscular re-education;Manual techniques;Passive range of motion;Dry needling;Joint Manipulations;Taping    PT Next Visit Plan  knee stretching and  strength, pain management, has visits but only through 5/26  PT Home Exercise Plan  Access Code: [email or web address]    Consulted and Agree with Plan of Care  Patient       Patient will benefit from skilled therapeutic intervention in order to improve the following deficits and impairments:  Abnormal gait, Decreased strength, Decreased range of motion, Difficulty walking, Impaired flexibility, Pain  Visit Diagnosis: Chronic pain of right knee  Chronic pain of left knee  Muscle weakness (generalized)     Problem List Patient Active Problem List   Diagnosis Date Noted  . Hx of pleural effusion   . Dyspnea 08/16/2018  . Fall on or from sidewalk curb, initial encounter 05/31/2018  . History of acute inferior wall MI 08/22/2017  . Diabetes mellitus ([place])   . S/P right coronary artery (RCA) stent placement   . Overweight 05/05/2017  . Diabetes mellitus type 2, controlled, without complications ([place]) 89/57/0220  . Asthmatic bronchitis , chronic ([place]) 05/10/2016  . Polymyositis ([place]) 09/15/2015  . History of sarcoidosis 09/15/2015  . Falling 03/13/2015  . Edema 02/13/2014  . Coronary artery disease involving native heart without angina pectoris 10/29/2013  . Hyperlipidemia 10/29/2013  . Iron (Fe) deficiency anemia 08/08/2013  . Vitamin B 12 deficiency 08/08/2013  . GERD (gastroesophageal reflux disease) 09/08/2011  . Weakness 02/22/2011  . Depression 02/22/2011  . [place] DISEASE 04/30/2009  . Vitamin D deficiency 02/05/2009  . Venous (peripheral) insufficiency 12/12/2007  . RENAL CALCULUS 12/12/2007  . DIZZINESS 12/12/2007  . HYPERCHOLESTEROLEMIA 09/16/2007  . Anxiety state 09/16/2007  . HYPERTENSION, BENIGN 09/16/2007  . Allergic rhinitis 09/16/2007  . IRRITABLE BOWEL SYNDROME 09/16/2007  . Myalgia and myositis 09/16/2007    Silvestre Mesi 03/26/2020, 8:37 PM  [facility] Physical Therapy [address] [place], [place], [address] Phone:  [phone]   Fax:  [phone]  Name: TERRAN HOLLENKAMP MRN: [id number] Date of Birth: 10/21/1956

## 2020-03-31 ENCOUNTER — Other Ambulatory Visit: Payer: Self-pay

## 2020-03-31 ENCOUNTER — Ambulatory Visit: Payer: Medicare PPO | Admitting: Physical Therapy

## 2020-03-31 ENCOUNTER — Encounter: Payer: Self-pay | Admitting: Physical Therapy

## 2020-03-31 DIAGNOSIS — M25562 Pain in left knee: Secondary | ICD-10-CM | POA: Diagnosis not present

## 2020-03-31 DIAGNOSIS — M6281 Muscle weakness (generalized): Secondary | ICD-10-CM | POA: Diagnosis not present

## 2020-03-31 DIAGNOSIS — G8929 Other chronic pain: Secondary | ICD-10-CM

## 2020-03-31 DIAGNOSIS — M25561 Pain in right knee: Secondary | ICD-10-CM

## 2020-03-31 NOTE — Therapy (Deleted)
[facility] Physical Therapy [address] [place], [place], [address] Phone: [phone]   Fax:  [phone]  Patient Details  Name: Jocelyn [name] MRN: [id number] Date of Birth: 1956/03/14 Referring Provider:  Leandrew Koyanagi, MD  Encounter Date: 03/31/2020   [name] 03/31/2020, 4:17 PM  [facility] Physical Therapy [address] [place], [place], [address] Phone: [phone]   Fax:  [phone]

## 2020-03-31 NOTE — Therapy (Signed)
Jocelyn [name] [place], [place], [address] Phone: [phone]   Fax:  [phone]  Physical Therapy Treatment  Patient Details  Name: Jocelyn [name] MRN: [id number] Date of Birth: [birth year] Referring Provider (PT): Leandrew Koyanagi, MD   Encounter Date: 03/31/2020  PT End of Session - 03/31/20 1607    Visit Number  15    Number of Visits  16    Date for PT Re-Evaluation  04/09/20    Authorization Type  [employer] MCR    Authorization Time Period  4/28 to 04/09/2020 12 visits approved    Authorization - Visit Number  4    Authorization - Number of Visits  12    Progress Note Due on Visit  20    PT Start Time  1515    PT Stop Time  1605    PT Time Calculation (min)  50 min    Activity Tolerance  Patient tolerated treatment well    Behavior During Therapy  [facility] for tasks assessed/performed       Past Medical History:  Diagnosis Date  . Acute bronchitis   . Allergic rhinitis, cause unspecified   . Anemia   . Anxiety   . B12 deficiency   . BV (bacterial vaginosis) 06/22/1996  . Calculus of kidney   . Calculus of kidney   . Coronary atherosclerosis of unspecified type of vessel, native or graft   . Dizziness   . Essential hypertension, benign   . Fibroid 2003  . Fibromyalgia   . H/O dysmenorrhea 2008  . H/O varicella   . Headache(784.0)    frequently  . HSV-2 infection 2009  . Hyperplastic colon polyp 05/16/2014  . Irritable bowel syndrome   . Meniere's disease, unspecified   . Menses, irregular 2003  . Myalgia and myositis, unspecified   . Obstructive sleep apnea (adult) (pediatric)   . Perimenopausal symptoms 2003  . Pure hypercholesterolemia   . Sarcoidosis   . Type II or unspecified type diabetes mellitus without mention of complication, not stated as uncontrolled   . Unspecified venous (peripheral) insufficiency   . Vitamin D deficiency   . Vulvitis 2010  . Yeast infection     Past Surgical History:  Procedure  Laterality Date  . COLONOSCOPY    . CORONARY STENT INTERVENTION N/A 06/15/2017   Procedure: CORONARY STENT INTERVENTION;  Surgeon: Jettie Booze, MD;  Location: [place] [facility];  Service: Cardiovascular;  Laterality: N/A;  . heart catherization    . LEFT HEART CATH AND CORONARY ANGIOGRAPHY N/A 06/15/2017   Procedure: LEFT HEART CATH AND CORONARY ANGIOGRAPHY;  Surgeon: Jettie Booze, MD;  Location: [place] [facility];  Service: Cardiovascular;  Laterality: N/A;  . TONSILLECTOMY      There were no vitals filed for this visit.  Subjective Assessment - 03/31/20 1518    Subjective  Pt arriving today to therapy reporting 5/10 pain in bilateral knees.    Diagnostic tests  XR Bilat knee OA right greater than left knee.    Patient Stated Goals  be able to walk with less pain    Currently in Pain?  Yes    Pain Score  5     Pain Location  Knee    Pain Orientation  Right;Left   more pain on R knee   Pain Descriptors / Indicators  Aching    Pain Type  Chronic pain    Pain Onset  More than a month ago  Pain Frequency  Intermittent                        [facility] Adult PT Treatment/Exercise - 03/31/20 0001      Knee/Hip Exercises: Stretches   Sports administrator  Right;Left;2 reps;30 seconds    Quad Stretch Limitations  standing with strap    Press photographer  Both;3 reps;30 seconds    Gastroc Stretch Limitations  slant board      Knee/Hip Exercises: Aerobic   Recumbent Bike  L2 6 minutes      Knee/Hip Exercises: Machines for Strengthening   Total Gym Leg Press  62# bilateral LE's 3 sets of 10 reps       Knee/Hip Exercises: Standing   Hip Abduction  Both;2 sets;10 reps    Abduction Limitations  3 lbs    Forward Step Up  Both;15 reps;Hand Hold: 1;Step Height: 6"      Knee/Hip Exercises: Seated   Sit to Sand  10 reps;with UE support   intermittent UE support     Modalities   Modalities  Iontophoresis      Iontophoresis   Type of Iontophoresis  Dexamethasone     Location  R medial knee    Dose  1.0    Time  6 hour patch                  PT Long Term Goals - 03/31/20 1616      PT LONG TERM GOAL #1   Title  Pt will be I and compliant with HEP. (Target for all goals 6 week 01/08/20)    Status  Achieved      PT LONG TERM GOAL #2   Title  Pt will improve hip strength to 4+ and knee strength to 5/5 MMT in sitting to show improved strength for functional abilites.    Status  On-going      PT LONG TERM GOAL #3   Title  Pt will improve bilat knee AROM to >125 deg to improve function    Status  Achieved      PT LONG TERM GOAL #4   Title  Pt willl report overall less than 3-4 pain with ususal activity.    Status  On-going            Plan - 03/31/20 1612    Clinical Impression Statement  Pt arriving to therapy reporting 5/10 pain in bilateral knees. Pt reporting more pain on R medial knee. Pt tolerating standing and strengthening exercises today,6 hour Ionto patch applied to R medial knee. We discussed possible discharge in the upcoming visits. We also discussed follow up with Orthopedist about low back pain.    Personal Factors and Comorbidities  Time since onset of injury/illness/exacerbation;Comorbidity 1    Comorbidities  knee OA    Examination-Activity Limitations  Bend;Squat;Stairs;Stand;Lift;Locomotion Level    Examination-Participation Restrictions  Cleaning;Community Activity;Shop;Laundry    Stability/Clinical Decision Making  Evolving/Moderate complexity    Rehab Potential  Good    PT Frequency  2x / week    PT Treatment/Interventions  ADLs/Self Care Home Management;Aquatic Therapy;Cryotherapy;Electrical Stimulation;Iontophoresis 4mg /ml Dexamethasone;Moist Heat;Ultrasound;Stair training;Therapeutic activities;Therapeutic exercise;Balance training;Neuromuscular re-education;Manual techniques;Passive range of motion;Dry needling;Joint Manipulations;Taping    PT Next Visit Plan  knee stretching and strength, pain management,  has visits but only through 5/26    PT Home Exercise Plan  Access Code: [email or web address]    Consulted and Agree with Plan of Care  Patient  Patient will benefit from skilled therapeutic intervention in order to improve the following deficits and impairments:  Abnormal gait, Decreased strength, Decreased range of motion, Difficulty walking, Impaired flexibility, Pain  Visit Diagnosis: Chronic pain of right knee  Chronic pain of left knee  Muscle weakness (generalized)     Problem List Patient Active Problem List   Diagnosis Date Noted  . Hx of pleural effusion   . Dyspnea 08/16/2018  . Fall on or from sidewalk curb, initial encounter 05/31/2018  . History of acute inferior wall MI 08/22/2017  . Diabetes mellitus ([place])   . S/P right coronary artery (RCA) stent placement   . Overweight 05/05/2017  . Diabetes mellitus type 2, controlled, without complications ([place]) 99991111  . Asthmatic bronchitis , chronic ([place]) 05/10/2016  . Polymyositis ([place]) 09/15/2015  . History of sarcoidosis 09/15/2015  . Falling 03/13/2015  . Edema 02/13/2014  . Coronary artery disease involving native heart without angina pectoris 10/29/2013  . Hyperlipidemia 10/29/2013  . Iron (Fe) deficiency anemia 08/08/2013  . Vitamin B 12 deficiency 08/08/2013  . GERD (gastroesophageal reflux disease) 09/08/2011  . Weakness 02/22/2011  . Depression 02/22/2011  . [place] DISEASE 04/30/2009  . Vitamin D deficiency 02/05/2009  . Venous (peripheral) insufficiency 12/12/2007  . RENAL CALCULUS 12/12/2007  . DIZZINESS 12/12/2007  . HYPERCHOLESTEROLEMIA 09/16/2007  . Anxiety state 09/16/2007  . HYPERTENSION, BENIGN 09/16/2007  . Allergic rhinitis 09/16/2007  . IRRITABLE BOWEL SYNDROME 09/16/2007  . Myalgia and myositis 09/16/2007    [name], PT, MPT 03/31/2020, 4:18 PM  [facility] Physical Therapy [address] [place], [place], [address] Phone: [phone]   Fax:   [phone]  Name: Jocelyn [name] MRN: [id number] Date of Birth: Dec 24, 1955

## 2020-04-07 ENCOUNTER — Other Ambulatory Visit: Payer: Self-pay

## 2020-04-07 ENCOUNTER — Encounter: Payer: Self-pay | Admitting: Physical Therapy

## 2020-04-07 ENCOUNTER — Ambulatory Visit: Payer: Medicare PPO | Admitting: Physical Therapy

## 2020-04-07 DIAGNOSIS — M25561 Pain in right knee: Secondary | ICD-10-CM | POA: Diagnosis not present

## 2020-04-07 DIAGNOSIS — G8929 Other chronic pain: Secondary | ICD-10-CM | POA: Diagnosis not present

## 2020-04-07 DIAGNOSIS — M25562 Pain in left knee: Secondary | ICD-10-CM | POA: Diagnosis not present

## 2020-04-07 DIAGNOSIS — M6281 Muscle weakness (generalized): Secondary | ICD-10-CM

## 2020-04-07 NOTE — Therapy (Signed)
[place], [place], [address] Phone: [phone]   Fax:  [phone]  Physical Therapy Treatment  Patient Details  Name: Jocelyn [name] MRN: [id number] Date of Birth: 01/01/1956 Referring Provider (PT): Leandrew Koyanagi, MD   Encounter Date: 04/07/2020  PT End of Session - 04/07/20 1453    Visit Number  16    Number of Visits  16    Date for PT Re-Evaluation  04/09/20    Authorization Type  [employer] MCR    Authorization Time Period  4/28 to 04/09/2020 12 visits approved    Authorization - Visit Number  5    Authorization - Number of Visits  12    Progress Note Due on Visit  20    PT Start Time  1355    PT Stop Time  1440    PT Time Calculation (min)  45 min    Activity Tolerance  Patient tolerated treatment well    Behavior During Therapy  [facility] for tasks assessed/performed       Past Medical History:  Diagnosis Date  . Acute bronchitis   . Allergic rhinitis, cause unspecified   . Anemia   . Anxiety   . B12 deficiency   . BV (bacterial vaginosis) 06/22/1996  . Calculus of kidney   . Calculus of kidney   . Coronary atherosclerosis of unspecified type of vessel, native or graft   . Dizziness   . Essential hypertension, benign   . Fibroid 2003  . Fibromyalgia   . H/O dysmenorrhea 2008  . H/O varicella   . Headache(784.0)    frequently  . HSV-2 infection 2009  . Hyperplastic colon polyp 05/16/2014  . Irritable bowel syndrome   . Meniere's disease, unspecified   . Menses, irregular 2003  . Myalgia and myositis, unspecified   . Obstructive sleep apnea (adult) (pediatric)   . Perimenopausal symptoms 2003  . Pure hypercholesterolemia   . Sarcoidosis   . Type II or unspecified type diabetes mellitus without mention of complication, not stated as uncontrolled   . Unspecified venous (peripheral) insufficiency   . Vitamin D deficiency   . Vulvitis 2010  . Yeast infection     Past Surgical History:  Procedure  Laterality Date  . COLONOSCOPY    . CORONARY STENT INTERVENTION N/A 06/15/2017   Procedure: CORONARY STENT INTERVENTION;  Surgeon: Jettie Booze, MD;  Location: [place] [facility];  Service: Cardiovascular;  Laterality: N/A;  . heart catherization    . LEFT HEART CATH AND CORONARY ANGIOGRAPHY N/A 06/15/2017   Procedure: LEFT HEART CATH AND CORONARY ANGIOGRAPHY;  Surgeon: Jettie Booze, MD;  Location: [place] [facility];  Service: Cardiovascular;  Laterality: N/A;  . TONSILLECTOMY      There were no vitals filed for this visit.  Subjective Assessment - 04/07/20 1443    Subjective  Pt arriving today to therapy reporting 5/10 pain in Rt knee, Lt knee is doing okay today. She still has some complaints of LBP and says she will see Dr Junius Roads about this.    How long can you stand comfortably?  hour at most    How long can you walk comfortably?  30 min    Diagnostic tests  XR Bilat knee OA right greater than left knee.    Patient Stated Goals  be able to walk with less pain    Pain Onset  More than a month ago  [place] Adult PT Treatment/Exercise - 04/07/20 0001      Knee/Hip Exercises: Stretches   Active Hamstring Stretch  Both;2 reps;30 seconds    Quad Stretch  Right;Left;2 reps;30 seconds    Quad Stretch Limitations  standing with strap    Gastroc Stretch  Both;3 reps;30 seconds    Gastroc Stretch Limitations  slant board      Knee/Hip Exercises: Aerobic   Nustep  10 min L4 LE      Knee/Hip Exercises: Machines for Strengthening   Total Gym Leg Press  62# bilateral LE's 3 sets of 10 reps       Knee/Hip Exercises: Standing   Knee Flexion  Both;2 sets;15 reps    Knee Flexion Limitations  3 lbs    Hip Abduction  Both;2 sets;15 reps    Abduction Limitations  3 lbs    Forward Step Up  Both;15 reps;Hand Hold: 1;Step Height: 6"      Knee/Hip Exercises: Seated   Long Arc Quad  Both;2 sets;15 reps    Long Arc Quad Weight  3 lbs.    Sit to [employer]  2 sets;5 reps;with UE  support      Acupuncturist Location  Rt knee    Electrical Stimulation Action  IFC    Electrical Stimulation Parameters  tolerance    Electrical Stimulation Goals  Pain                  PT Long Term Goals - 03/31/20 1616      PT LONG TERM GOAL #1   Title  Pt will be I and compliant with HEP. (Target for all goals 6 week 01/08/20)    Status  Achieved      PT LONG TERM GOAL #2   Title  Pt will improve hip strength to 4+ and knee strength to 5/5 MMT in sitting to show improved strength for functional abilites.    Status  On-going      PT LONG TERM GOAL #3   Title  Pt will improve bilat knee AROM to >125 deg to improve function    Status  Achieved      PT LONG TERM GOAL #4   Title  Pt willl report overall less than 3-4 pain with ususal activity.    Status  On-going            Plan - 04/07/20 1453    Clinical Impression Statement  She has overall done well with PT but still having knee OA pain. Will plan to discharge to HEP next visit as date for authorized PT visits is up with her insurance.    Personal Factors and Comorbidities  Time since onset of injury/illness/exacerbation;Comorbidity 1    Comorbidities  knee OA    Examination-Activity Limitations  Bend;Squat;Stairs;Stand;Lift;Locomotion Level    Examination-Participation Restrictions  Cleaning;Community Activity;Shop;Laundry    Stability/Clinical Decision Making  Evolving/Moderate complexity    Rehab Potential  Good    PT Frequency  2x / week    PT Treatment/Interventions  ADLs/Self Care Home Management;Aquatic Therapy;Cryotherapy;Electrical Stimulation;Iontophoresis 4mg /ml Dexamethasone;Moist Heat;Ultrasound;Stair training;Therapeutic activities;Therapeutic exercise;Balance training;Neuromuscular re-education;Manual techniques;Passive range of motion;Dry needling;Joint Manipulations;Taping    PT Next Visit Plan  DC    PT Home Exercise Plan  Access Code: [email or web address]    Consulted  and Agree with Plan of Care  Patient       Patient will benefit from skilled therapeutic intervention in order to improve the following deficits and impairments:  Abnormal  gait, Decreased strength, Decreased range of motion, Difficulty walking, Impaired flexibility, Pain  Visit Diagnosis: Chronic pain of right knee  Chronic pain of left knee  Muscle weakness (generalized)     Problem List Patient Active Problem List   Diagnosis Date Noted  . Hx of pleural effusion   . Dyspnea 08/16/2018  . Fall on or from sidewalk curb, initial encounter 05/31/2018  . History of acute inferior wall MI 08/22/2017  . Diabetes mellitus ([place])   . S/P right coronary artery (RCA) stent placement   . Overweight 05/05/2017  . Diabetes mellitus type 2, controlled, without complications ([place]) 99991111  . Asthmatic bronchitis , chronic ([place]) 05/10/2016  . Polymyositis ([place]) 09/15/2015  . History of sarcoidosis 09/15/2015  . Falling 03/13/2015  . Edema 02/13/2014  . Coronary artery disease involving native heart without angina pectoris 10/29/2013  . Hyperlipidemia 10/29/2013  . Iron (Fe) deficiency anemia 08/08/2013  . Vitamin B 12 deficiency 08/08/2013  . GERD (gastroesophageal reflux disease) 09/08/2011  . Weakness 02/22/2011  . Depression 02/22/2011  . [place] DISEASE 04/30/2009  . Vitamin D deficiency 02/05/2009  . Venous (peripheral) insufficiency 12/12/2007  . RENAL CALCULUS 12/12/2007  . DIZZINESS 12/12/2007  . HYPERCHOLESTEROLEMIA 09/16/2007  . Anxiety state 09/16/2007  . HYPERTENSION, BENIGN 09/16/2007  . Allergic rhinitis 09/16/2007  . IRRITABLE BOWEL SYNDROME 09/16/2007  . Myalgia and myositis 09/16/2007    Debbe Odea ,PT,DPT 04/07/2020, 3:00 PM  [facility] [address] [place], [place], [address] Phone: [phone]   Fax:  [phone]  Name: Jocelyn [name] MRN: [id number] Date of Birth: [birth year]

## 2020-04-08 ENCOUNTER — Encounter: Payer: Medicare PPO | Admitting: Physical Therapy

## 2020-04-09 ENCOUNTER — Telehealth: Payer: Self-pay | Admitting: Pharmacist

## 2020-04-09 ENCOUNTER — Other Ambulatory Visit: Payer: Self-pay

## 2020-04-09 ENCOUNTER — Encounter: Payer: Self-pay | Admitting: Physical Therapy

## 2020-04-09 ENCOUNTER — Ambulatory Visit: Payer: Medicare PPO | Admitting: Physical Therapy

## 2020-04-09 DIAGNOSIS — E785 Hyperlipidemia, unspecified: Secondary | ICD-10-CM

## 2020-04-09 DIAGNOSIS — M6281 Muscle weakness (generalized): Secondary | ICD-10-CM | POA: Diagnosis not present

## 2020-04-09 DIAGNOSIS — G8929 Other chronic pain: Secondary | ICD-10-CM

## 2020-04-09 DIAGNOSIS — M25562 Pain in left knee: Secondary | ICD-10-CM | POA: Diagnosis not present

## 2020-04-09 DIAGNOSIS — M25561 Pain in right knee: Secondary | ICD-10-CM

## 2020-04-09 NOTE — Telephone Encounter (Signed)
Lipid labs order and appointment made to see how Nexletol is working.

## 2020-04-09 NOTE — Therapy (Signed)
[facility] [address] [place], [place], [address] Phone: [phone]   Fax:  [phone]  Physical Therapy Treatment/Discharge PHYSICAL THERAPY DISCHARGE SUMMARY  Visits from Start of Care: 17  Current functional level related to goals / functional outcomes: See below   Remaining deficits: See below   Education / Equipment: HEP Plan: Patient agrees to discharge.  Patient goals were met. Patient is being discharged due to meeting the stated rehab goals.  ?????         Patient Details  Name: Jocelyn [name] MRN: [id number] Date of Birth: June 12, 1956 Referring Provider (PT): Leandrew Koyanagi, MD   Encounter Date: 04/09/2020  PT End of Session - 04/09/20 1449    Visit Number  17    Number of Visits  17    Date for PT Re-Evaluation  04/09/20    Authorization Type  [employer] MCR    Authorization Time Period  4/28 to 04/09/2020 12 visits approved    Authorization - Visit Number  6    Authorization - Number of Visits  12    Progress Note Due on Visit  20    PT Start Time  5009    PT Stop Time  1430    PT Time Calculation (min)  45 min    Activity Tolerance  Patient tolerated treatment well    Behavior During Therapy  [facility] for tasks assessed/performed       Past Medical History:  Diagnosis Date  . Acute bronchitis   . Allergic rhinitis, cause unspecified   . Anemia   . Anxiety   . B12 deficiency   . BV (bacterial vaginosis) 06/22/1996  . Calculus of kidney   . Calculus of kidney   . Coronary atherosclerosis of unspecified type of vessel, native or graft   . Dizziness   . Essential hypertension, benign   . Fibroid 2003  . Fibromyalgia   . H/O dysmenorrhea 2008  . H/O varicella   . Headache(784.0)    frequently  . HSV-2 infection 2009  . Hyperplastic colon polyp 05/16/2014  . Irritable bowel syndrome   . Meniere's disease, unspecified   . Menses, irregular 2003  . Myalgia and myositis, unspecified   . Obstructive sleep apnea  (adult) (pediatric)   . Perimenopausal symptoms 2003  . Pure hypercholesterolemia   . Sarcoidosis   . Type II or unspecified type diabetes mellitus without mention of complication, not stated as uncontrolled   . Unspecified venous (peripheral) insufficiency   . Vitamin D deficiency   . Vulvitis 2010  . Yeast infection     Past Surgical History:  Procedure Laterality Date  . COLONOSCOPY    . CORONARY STENT INTERVENTION N/A 06/15/2017   Procedure: CORONARY STENT INTERVENTION;  Surgeon: Jettie Booze, MD;  Location: [place] [facility];  Service: Cardiovascular;  Laterality: N/A;  . heart catherization    . LEFT HEART CATH AND CORONARY ANGIOGRAPHY N/A 06/15/2017   Procedure: LEFT HEART CATH AND CORONARY ANGIOGRAPHY;  Surgeon: Jettie Booze, MD;  Location: [place] [facility];  Service: Cardiovascular;  Laterality: N/A;  . TONSILLECTOMY      There were no vitals filed for this visit.  Subjective Assessment - 04/09/20 1416    Subjective  4/10 knee pain overall. Relays understanding of final HEP.    How long can you stand comfortably?  hour at most    How long can you walk comfortably?  30 min    Diagnostic tests  XR  Bilat knee OA right greater than left knee.    Patient Stated Goals  be able to walk with less pain    Pain Onset  More than a month ago         [facility] PT Assessment - 04/09/20 0001      Assessment   Medical Diagnosis  Bilat knee OA right greater than left knee.    Referring Provider (PT)  Leandrew Koyanagi, MD    Next MD Visit  will follow up with Dr. Junius Roads for LBP      AROM   Right Knee Extension  0    Right Knee Flexion  130    Left Knee Extension  0    Left Knee Flexion  135      Strength   Overall Strength Comments  hip strength 4+/5 grossly bilat, knee strength 5-/5 bilat, all tested in sitting                    [facility] Adult PT Treatment/Exercise - 04/09/20 0001      Knee/Hip Exercises: Stretches   Active Hamstring Stretch  Both;2  reps;30 seconds    Quad Stretch  Right;Left;2 reps;30 seconds    Quad Stretch Limitations  standing with strap      Knee/Hip Exercises: Aerobic   Nustep  10 min L5 LE      Knee/Hip Exercises: Machines for Strengthening   Total Gym Leg Press  62# bilateral LE's 3 sets of 10 reps       Knee/Hip Exercises: Standing   Knee Flexion  Both;2 sets;15 reps    Knee Flexion Limitations  3 lbs    Hip Abduction  Both;2 sets;15 reps    Abduction Limitations  3 lbs      Knee/Hip Exercises: Seated   Long Arc Quad  Both;2 sets;15 reps    Long Arc Quad Weight  3 lbs.    Sit to [employer]  2 sets;5 reps;with UE support      Acupuncturist Location  --    Electrical Stimulation Goals  --      Iontophoresis   Type of Iontophoresis  Dexamethasone    Location  R medial knee    Dose  1.0    Time  6 hour patch                  PT Long Term Goals - 04/09/20 1526      PT LONG TERM GOAL #1   Title  Pt will be I and compliant with HEP. (Target for all goals 6 week 01/08/20)    Status  Achieved      PT LONG TERM GOAL #2   Title  Pt will improve hip strength to 4+ and knee strength to 5/5 MMT in sitting to show improved strength for functional abilites.    Status  Achieved      PT LONG TERM GOAL #3   Title  Pt will improve bilat knee AROM to >125 deg to improve function    Status  Achieved      PT LONG TERM GOAL #4   Title  Pt willl report overall less than 3-4 pain with ususal activity.    Status  Partially Met            Plan - 04/09/20 1450    Clinical Impression Statement  She has overall met her her PT goals and improved her bilat knee strength and ROM.  Her pain is overall better but still does have about 4-5 out of 10 knee OA pain. Knee brace has helped some. She will be discharged today to HEP.    Personal Factors and Comorbidities  Time since onset of injury/illness/exacerbation;Comorbidity 1    Comorbidities  knee OA    Examination-Activity  Limitations  Bend;Squat;Stairs;Stand;Lift;Locomotion Level    Examination-Participation Restrictions  Cleaning;Community Activity;Shop;Laundry    Stability/Clinical Decision Making  Evolving/Moderate complexity    Rehab Potential  Good    PT Frequency  2x / week    PT Treatment/Interventions  ADLs/Self Care Home Management;Aquatic Therapy;Cryotherapy;Electrical Stimulation;Iontophoresis 60m/ml Dexamethasone;Moist Heat;Ultrasound;Stair training;Therapeutic activities;Therapeutic exercise;Balance training;Neuromuscular re-education;Manual techniques;Passive range of motion;Dry needling;Joint Manipulations;Taping    PT Next Visit Plan  DC    PT Home Exercise Plan  Access Code: [email or web address]    Consulted and Agree with Plan of Care  Patient       Patient will benefit from skilled therapeutic intervention in order to improve the following deficits and impairments:  Abnormal gait, Decreased strength, Decreased range of motion, Difficulty walking, Impaired flexibility, Pain  Visit Diagnosis: Chronic pain of right knee  Chronic pain of left knee  Muscle weakness (generalized)     Problem List Patient Active Problem List   Diagnosis Date Noted  . Hx of pleural effusion   . Dyspnea 08/16/2018  . Fall on or from sidewalk curb, initial encounter 05/31/2018  . History of acute inferior wall MI 08/22/2017  . Diabetes mellitus ([place]   . S/P right coronary artery (RCA) stent placement   . Overweight 05/05/2017  . Diabetes mellitus type 2, controlled, without complications ([place] 082/50/[id number] . Asthmatic bronchitis , chronic ([place] 05/10/2016  . Polymyositis ([place] 09/15/2015  . History of sarcoidosis 09/15/2015  . Falling 03/13/2015  . Edema 02/13/2014  . Coronary artery disease involving native heart without angina pectoris 10/29/2013  . Hyperlipidemia 10/29/2013  . Iron (Fe) deficiency anemia 08/08/2013  . Vitamin B 12 deficiency 08/08/2013  . GERD (gastroesophageal reflux disease) 09/08/2011   . Weakness 02/22/2011  . Depression 02/22/2011  . [place] 04/30/2009  . Vitamin D deficiency 02/05/2009  . Venous (peripheral) insufficiency 12/12/2007  . RENAL CALCULUS 12/12/2007  . DIZZINESS 12/12/2007  . HYPERCHOLESTEROLEMIA 09/16/2007  . Anxiety state 09/16/2007  . HYPERTENSION, BENIGN 09/16/2007  . Allergic rhinitis 09/16/2007  . IRRITABLE BOWEL SYNDROME 09/16/2007  . Myalgia and myositis 09/16/2007    BSilvestre Mesi5/26/2021, 3:30 PM  [facility] [address] [address]Phone: [phone]  Fax:  [phone] Name: Jocelyn REEDYMRN: [id number] of Birth: 1April 30, 1957

## 2020-04-11 ENCOUNTER — Ambulatory Visit: Payer: Medicare PPO | Admitting: Family Medicine

## 2020-04-11 ENCOUNTER — Ambulatory Visit (INDEPENDENT_AMBULATORY_CARE_PROVIDER_SITE_OTHER): Payer: Medicare PPO

## 2020-04-11 ENCOUNTER — Encounter: Payer: Self-pay | Admitting: Family Medicine

## 2020-04-11 ENCOUNTER — Other Ambulatory Visit: Payer: Self-pay

## 2020-04-11 DIAGNOSIS — M25561 Pain in right knee: Secondary | ICD-10-CM | POA: Diagnosis not present

## 2020-04-11 DIAGNOSIS — G8929 Other chronic pain: Secondary | ICD-10-CM | POA: Diagnosis not present

## 2020-04-11 DIAGNOSIS — M545 Low back pain: Secondary | ICD-10-CM | POA: Diagnosis not present

## 2020-04-11 MED ORDER — GLUCOSAMINE SULFATE 1000 MG PO CAPS
1.0000 | ORAL_CAPSULE | Freq: Two times a day (BID) | ORAL | 3 refills | Status: DC
Start: 2020-04-11 — End: 2020-07-08

## 2020-04-11 MED ORDER — BACLOFEN 10 MG PO TABS: 5.0000 mg | ORAL_TABLET | Freq: Three times a day (TID) | ORAL | 3 refills | Status: DC | PRN
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
## 2020-04-16 ENCOUNTER — Other Ambulatory Visit: Payer: Medicare PPO | Admitting: *Deleted

## 2020-04-16 ENCOUNTER — Other Ambulatory Visit: Payer: Self-pay

## 2020-04-16 DIAGNOSIS — E785 Hyperlipidemia, unspecified: Secondary | ICD-10-CM | POA: Diagnosis not present

## 2020-04-16 NOTE — Progress Notes (Signed)
Jocelyn [name] emailed the order, plus demographics and last office note to [place] at [facility].

## 2020-04-17 ENCOUNTER — Telehealth: Payer: Self-pay | Admitting: Pharmacist

## 2020-04-17 LAB — LIPID PANEL
Chol/HDL Ratio: 6.9 ratio — ABNORMAL HIGH (ref 0.0–4.4)
Cholesterol, Total: 228 mg/dL — ABNORMAL HIGH (ref 100–199)
HDL: 33 mg/dL — ABNORMAL LOW (ref >39)
LDL Chol Calc (NIH): 159 mg/dL — ABNORMAL HIGH (ref 0–99)
Triglycerides: 192 mg/dL — ABNORMAL HIGH (ref 0–149)
VLDL Cholesterol Cal: 36 mg/dL (ref 5–40)

## 2020-04-17 LAB — HEPATIC FUNCTION PANEL
ALT: 14 IU/L (ref 0–32)
AST: 27 IU/L (ref 0–40)
Albumin: 4.4 g/dL (ref 3.8–4.8)
Alkaline Phosphatase: 59 IU/L (ref 48–121)
Bilirubin Total: 0.3 mg/dL (ref 0.0–1.2)
Bilirubin, Direct: 0.09 mg/dL (ref 0.00–0.40)
Total Protein: 7 g/dL (ref 6.0–8.5)

## 2020-04-17 NOTE — Telephone Encounter (Signed)
Discussed lipid results with patient. She states she has been using Vascepa 2 caps BID, Repatha q 14 days and Nexletol daily. Patient is intolerant to statins and Zetia.  LDL is higher despite adding Nexletol. Will try switching to Praluent to see if we can get a better result. Patient has 4 pens of Repatha left. Advised ok to finish those and then switch. Will submit PA for Praluent. Educated patient on limiting carbs/sugars such as pasta, rice, potatoes, sweets and fruit due to continued elevated TG and diabetes

## 2020-04-18 IMAGING — CT CT CHEST W/ CM
2 of 3 series · 15 of 36 positions shown, 18 images · IV contrast (omnipaque)
Comparison: Chest x-ray 08/17/2018, 08/16/2018, 05/31/2018

CLINICAL DATA: Left-sided chest pain

EXAM:
CT CHEST WITH CONTRAST
TECHNIQUE: Multidetector CT imaging of the chest was performed during
intravenous contrast administration.
CONTRAST:  75mL OMNIPAQUE IOHEXOL 300 MG/ML  SOLN

[Series 3: thorax 2.0 i31f 2 · axial · 0.78mm/px · z∈[+1125,+1355]mm · 12 of 137 slices shown, 15 images]
[im 11/137  mediastinal]
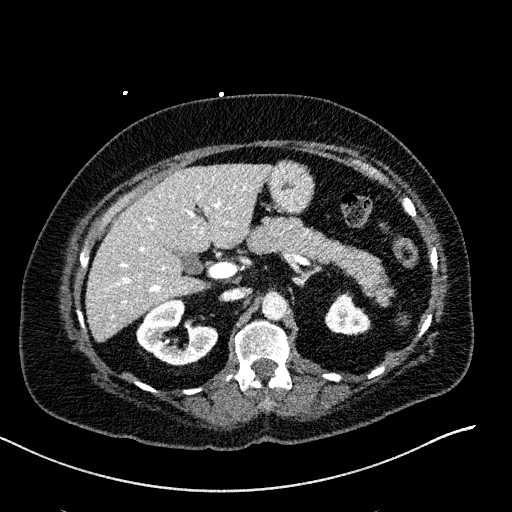
[im 11/137  lung]
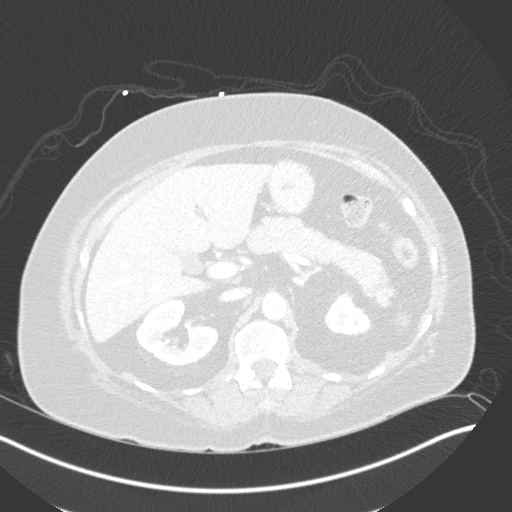
[im 21/137  lung]
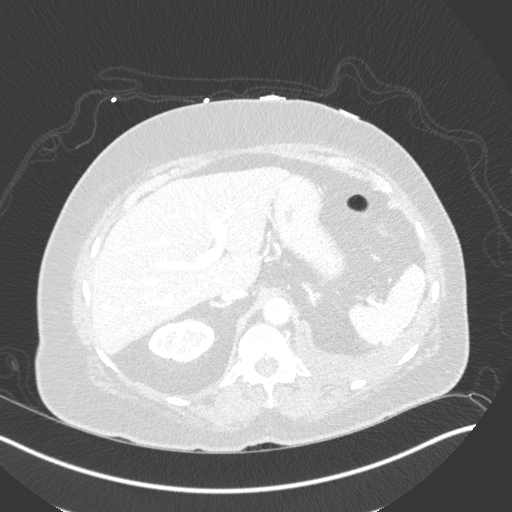
[im 31/137  lung]
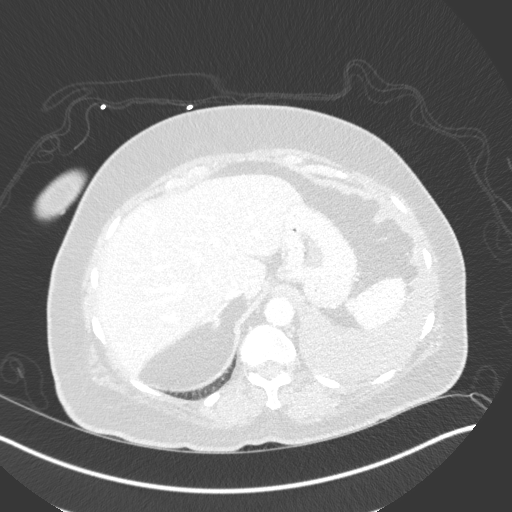
[im 41/137  lung]
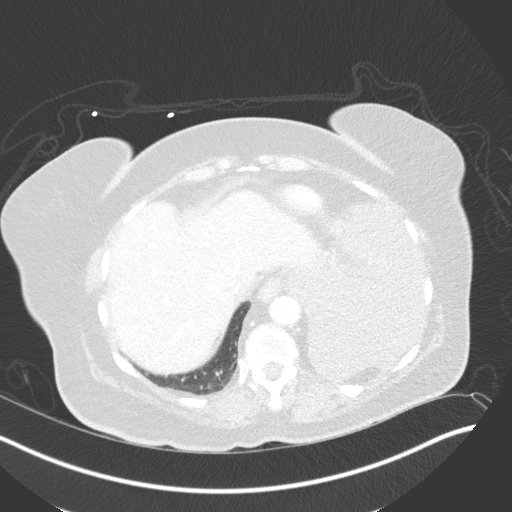
[im 51/137  mediastinal]
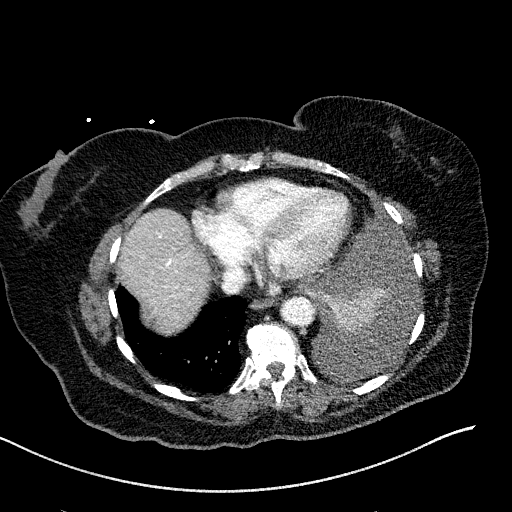
[im 51/137  lung]
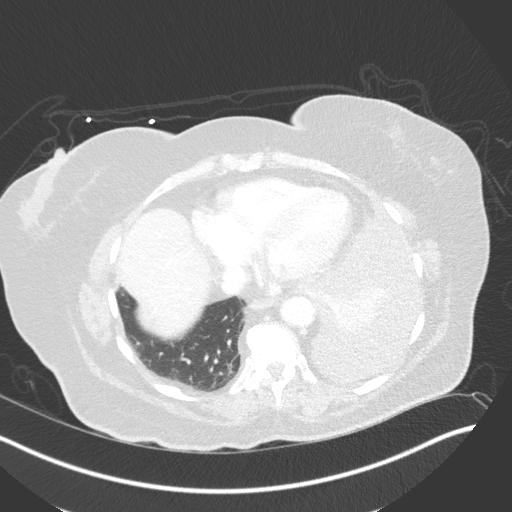
[im 61/137  lung]
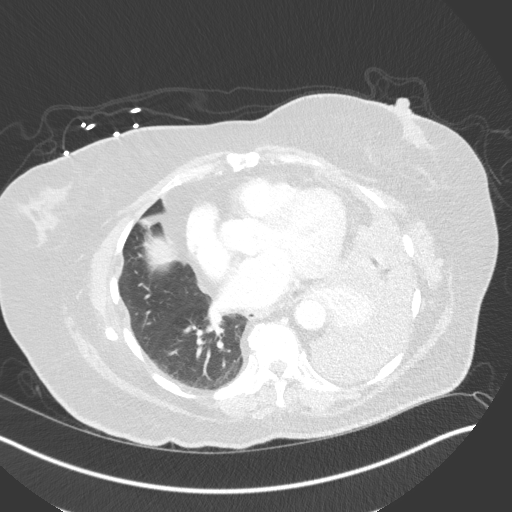
[im 76/137  lung]
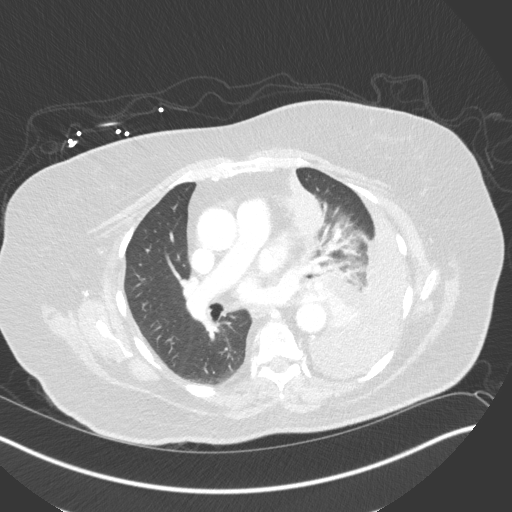
[im 86/137  lung]
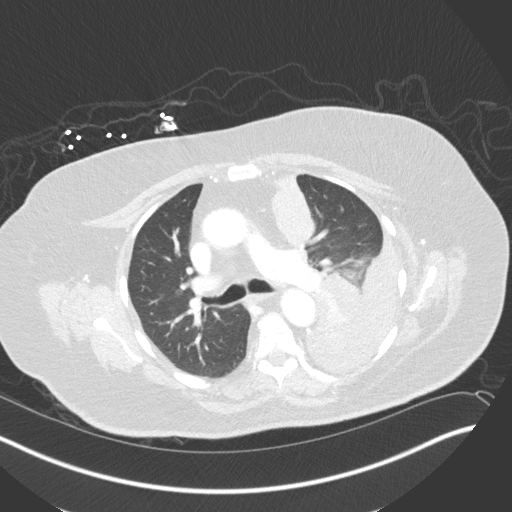
[im 96/137  mediastinal]
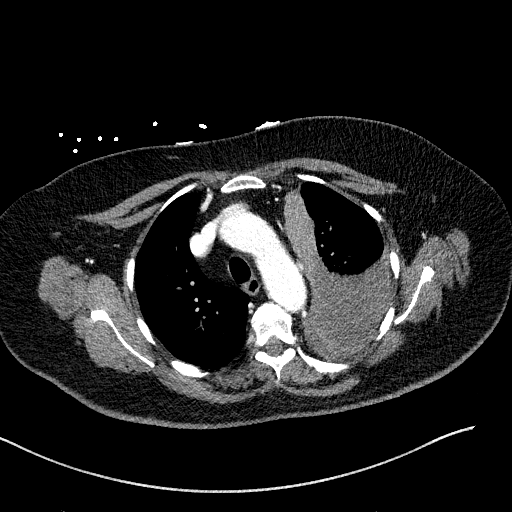
[im 96/137  lung]
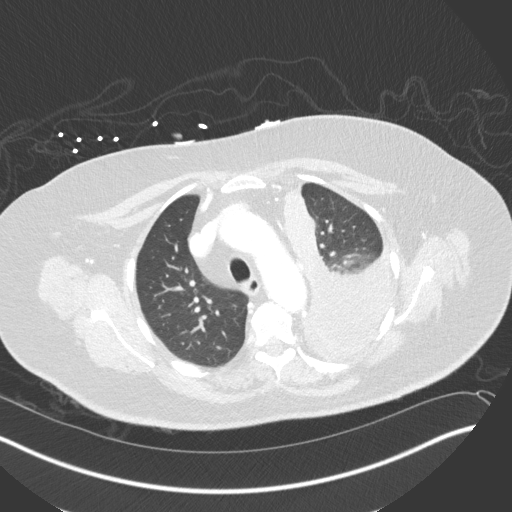
[im 106/137  lung]
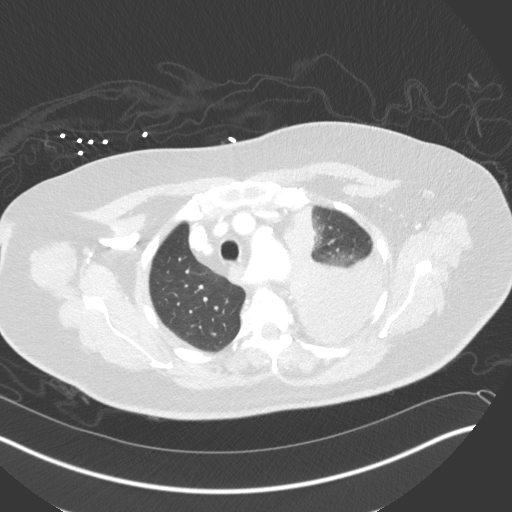
[im 116/137  lung]
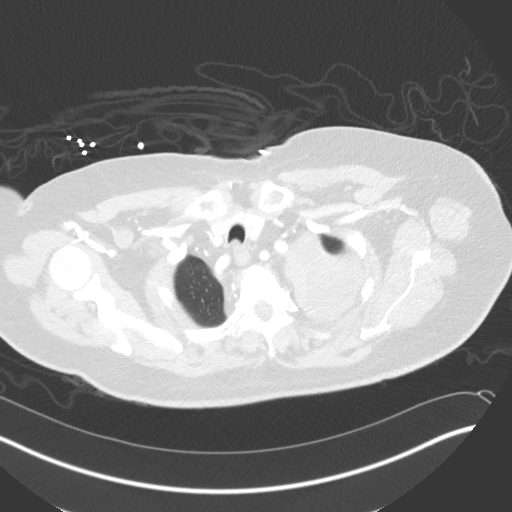
[im 126/137  lung]
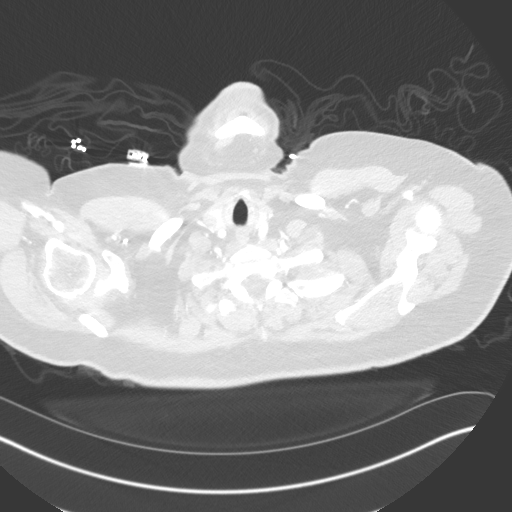

[Series 6: coronal · coronal · 0.55mm/px · 3 of 151 slices shown]
[im 31/151  lung]
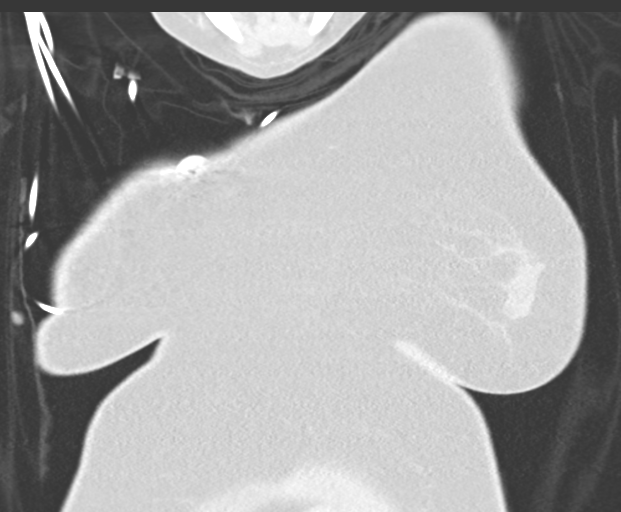
[im 61/151  lung]
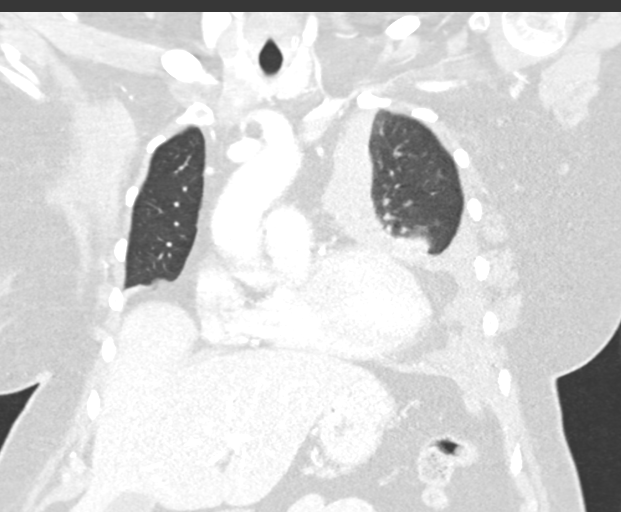
[im 91/151  lung]
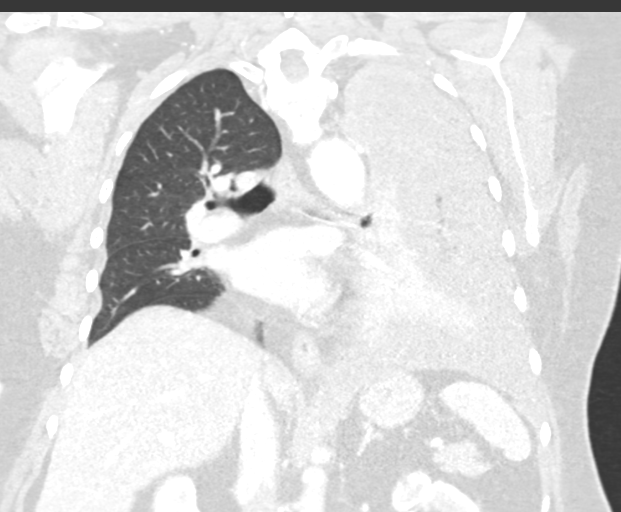

[15 of 36 positions shown; findings below may reference images not displayed]

FINDINGS: Cardiovascular: Nonaneurysmal aorta. Left-sided aortic arch with
aberrant right subclavian artery with retroesophageal course. Mild
aortic atherosclerosis. Coronary artery calcification. Normal heart
size. No pericardial effusion.

Mediastinum/Nodes: Midline trachea. Multiple hypodense thyroid
nodules, measuring up to 11 mm in the inferior right lobe. No
significant mediastinal adenopathy. Narrowed appearance of left
lower lobe bronchi.

Lungs/Pleura: Moderate slightly loculated left pleural effusion.
Consolidation within the left lower lobe and partial consolidation
in the lingula. Masslike density along the anterior aspect of the
left mediastinum measuring approximately 7.8 cm cranial caudad by
5.3 cm AP by 3 cm transverse, extends to the apical portion of the
left lung.

Upper Abdomen: No acute abnormality. Subcentimeter hypodense lesions
in the left kidney too small to further characterize. 15 mm left
adrenal gland nodule, indeterminate.

Musculoskeletal: No acute or suspicious osseous lesion.
IMPRESSION: 1. Lobulated masslike thickening along the left mediastinal contour
and extending to the left lung apex is concerning for large pleural
based mass. Not certain if this represents metastatic disease or
primary pleural based mass.
2. Moderate loculated left pleural effusion. Consolidation in the
left lower lobe and lingula with slightly nodular appearance of
consolidations at the lingula.
3. Indeterminate 1.5 cm left adrenal gland nodule
4. Left-sided aortic arch with aberrancy right subclavian artery
with retroesophageal course.

Aortic Atherosclerosis ([id number].E).

## 2020-04-18 MED ORDER — PRALUENT 150 MG/ML ~~LOC~~ SOAJ: 1.0000 "pen " | SUBCUTANEOUS | 11 refills | Status: DC

## 2020-04-18 NOTE — Telephone Encounter (Signed)
Praluent approved through 10/15/20. Patient will finish out what she has left of repatha and then start Praluent.

## 2020-04-18 NOTE — Addendum Note (Signed)
Addended by: Marcelle Overlie D on: 04/18/2020 04:28 PM   Modules accepted: Orders

## 2020-05-07 ENCOUNTER — Other Ambulatory Visit: Payer: Self-pay

## 2020-05-07 ENCOUNTER — Encounter: Payer: Self-pay | Admitting: Family Medicine

## 2020-05-07 ENCOUNTER — Ambulatory Visit: Payer: Medicare PPO | Admitting: Family Medicine

## 2020-05-07 DIAGNOSIS — M1711 Unilateral primary osteoarthritis, right knee: Secondary | ICD-10-CM

## 2020-05-07 DIAGNOSIS — M1712 Unilateral primary osteoarthritis, left knee: Secondary | ICD-10-CM

## 2020-05-07 DIAGNOSIS — M170 Bilateral primary osteoarthritis of knee: Secondary | ICD-10-CM

## 2020-05-07 MED ORDER — PREDNISONE 10 MG PO TABS: ORAL_TABLET | ORAL | 0 refills | Status: DC

## 2020-05-07 NOTE — Progress Notes (Addendum)
Office Visit Note   Patient: Jocelyn [name]           Date of Birth: [birth year]           MRN: [id number] Visit Date: 05/07/2020 Requested by: No referring provider defined for this encounter. PCP: Patient, No Pcp Per  Subjective: Chief Complaint  Patient presents with  . Right Knee - Pain    Dr. Noberto Retort recommended the patient come back for a recheck of her knees. The right one still hurts worse than the left. She does not have an OA brace yet from [facility] [employer].  . Left Knee - Pain    HPI: She is here with right greater than left knee pain. The pain is making her walk with a limp. She has not yet received her OA brace. Her knee feels swollen again. She is very needle phobic. Injection and aspiration that she had in December was pretty traumatic for her.              ROS:   All other systems were reviewed and are negative.  Objective: Vital Signs: There were no vitals taken for this visit.  Physical Exam:  General:  Alert and oriented, in no acute distress. Pulm:  Breathing unlabored. Psy:  Normal mood, congruent affect. Skin: No erythema Right knee: She has 1-2+ effusion with no warmth. Very tender on the medial joint line.  Has 1+ laxity with valgus stress but still a solid endpoint.    Imaging: No results found.  Assessment & Plan: 1. Right knee osteoarthritis -Discussed options with her, she wants to consider gel injections. We will try to get approval for this. Once approved, if still having quite a bit of pain, we will aspirate and inject the knee with gel. -Meanwhile she will take prednisone taper. - Will request custom brace.  Has large thigh to calf ratio and needs custom brace.     Procedures: No procedures performed  No notes on file     PMFS History: Patient Active Problem List   Diagnosis Date Noted  . Hx of pleural effusion   . Dyspnea 08/16/2018  . Fall on or from sidewalk curb, initial encounter 05/31/2018  . History of acute inferior  wall MI 08/22/2017  . Diabetes mellitus ([place])   . S/P right coronary artery (RCA) stent placement   . Overweight 05/05/2017  . Diabetes mellitus type 2, controlled, without complications ([place]) 19/37/9024  . Asthmatic bronchitis , chronic ([place]) 05/10/2016  . Polymyositis ([place]) 09/15/2015  . History of sarcoidosis 09/15/2015  . Falling 03/13/2015  . Edema 02/13/2014  . Coronary artery disease involving native heart without angina pectoris 10/29/2013  . Hyperlipidemia 10/29/2013  . Iron (Fe) deficiency anemia 08/08/2013  . Vitamin B 12 deficiency 08/08/2013  . GERD (gastroesophageal reflux disease) 09/08/2011  . Weakness 02/22/2011  . Depression 02/22/2011  . [place] DISEASE 04/30/2009  . Vitamin D deficiency 02/05/2009  . Venous (peripheral) insufficiency 12/12/2007  . RENAL CALCULUS 12/12/2007  . DIZZINESS 12/12/2007  . HYPERCHOLESTEROLEMIA 09/16/2007  . Anxiety state 09/16/2007  . HYPERTENSION, BENIGN 09/16/2007  . Allergic rhinitis 09/16/2007  . IRRITABLE BOWEL SYNDROME 09/16/2007  . Myalgia and myositis 09/16/2007   Past Medical History:  Diagnosis Date  . Acute bronchitis   . Allergic rhinitis, cause unspecified   . Anemia   . Anxiety   . B12 deficiency   . BV (bacterial vaginosis) 06/22/1996  . Calculus of kidney   . Calculus of kidney   .  Coronary atherosclerosis of unspecified type of vessel, native or graft   . Dizziness   . Essential hypertension, benign   . Fibroid 2003  . Fibromyalgia   . H/O dysmenorrhea 2008  . H/O varicella   . Headache(784.0)    frequently  . HSV-2 infection 2009  . Hyperplastic colon polyp 05/16/2014  . Irritable bowel syndrome   . Meniere's disease, unspecified   . Menses, irregular 2003  . Myalgia and myositis, unspecified   . Obstructive sleep apnea (adult) (pediatric)   . Perimenopausal symptoms 2003  . Pure hypercholesterolemia   . Sarcoidosis   . Type II or unspecified type diabetes mellitus without mention of  complication, not stated as uncontrolled   . Unspecified venous (peripheral) insufficiency   . Vitamin D deficiency   . Vulvitis 2010  . Yeast infection     Family History  Adopted: Yes  Problem Relation Age of Onset  . Other Other        ADOPTED  . Colon cancer Neg Hx   . Esophageal cancer Neg Hx   . Rectal cancer Neg Hx   . Stomach cancer Neg Hx     Past Surgical History:  Procedure Laterality Date  . COLONOSCOPY    . CORONARY STENT INTERVENTION N/A 06/15/2017   Procedure: CORONARY STENT INTERVENTION;  Surgeon: Jettie Booze, MD;  Location: [facility];  Service: Cardiovascular;  Laterality: N/A;  . heart catherization    . LEFT HEART CATH AND CORONARY ANGIOGRAPHY N/A 06/15/2017   Procedure: LEFT HEART CATH AND CORONARY ANGIOGRAPHY;  Surgeon: Jettie Booze, MD;  Location: [facility];  Service: Cardiovascular;  Laterality: N/A;  . TONSILLECTOMY     Social History   Occupational History  . Occupation: Retired Pharmacist, hospital   Tobacco Use  . Smoking status: Never Smoker  . Smokeless tobacco: Never Used  . Tobacco comment: Daily Caffeine - 1  Exercise 2-3 times/weekly  Vaping Use  . Vaping Use: Never used  Substance and Sexual Activity  . Alcohol use: No    Alcohol/week: 0.0 standard drinks  . Drug use: No  . Sexual activity: Yes    Birth control/protection: None

## 2020-05-08 NOTE — Progress Notes (Signed)
Noted  

## 2020-05-13 ENCOUNTER — Telehealth: Payer: Self-pay

## 2020-05-13 NOTE — Telephone Encounter (Signed)
Submitted VOB, Monovisc, right knee. 

## 2020-05-23 ENCOUNTER — Telehealth: Payer: Self-pay

## 2020-05-23 NOTE — Telephone Encounter (Signed)
Tried calling patient on both numbers listed, but no answer and was not able to leave a VM due to mailbox being full.  Approved, Monovisc, right knee. 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 No Co-pay PA required PA Approval# [id number] Valid 05/23/2020- 11/23/2020

## 2020-06-02 ENCOUNTER — Telehealth: Payer: Self-pay

## 2020-06-02 NOTE — Telephone Encounter (Signed)
Pt states she is having rectal pain/discomfort and requesting to see Dr. Tarri Glenn. Pt scheduled to see Dr. Tarri Glenn Friday 06/06/20@9 :10am. Pt aware of appt and placed on cancellation list.

## 2020-06-06 ENCOUNTER — Ambulatory Visit: Payer: Medicare PPO | Admitting: Gastroenterology

## 2020-06-06 ENCOUNTER — Encounter: Payer: Self-pay | Admitting: Gastroenterology

## 2020-06-06 VITALS — BP 100/64 | HR 82 | Ht 62.0 in | Wt 166.0 lb

## 2020-06-06 DIAGNOSIS — K602 Anal fissure, unspecified: Secondary | ICD-10-CM

## 2020-06-06 DIAGNOSIS — Z7689 Persons encountering health services in other specified circumstances: Secondary | ICD-10-CM

## 2020-06-06 DIAGNOSIS — M1711 Unilateral primary osteoarthritis, right knee: Secondary | ICD-10-CM | POA: Diagnosis not present

## 2020-06-06 MED ORDER — PANTOPRAZOLE SODIUM 40 MG PO TBEC: 40.0000 mg | DELAYED_RELEASE_TABLET | Freq: Every day | ORAL | 3 refills | Status: DC

## 2020-06-06 MED ORDER — AMBULATORY NON FORMULARY MEDICATION: 1 refills | Status: DC

## 2020-06-06 NOTE — Progress Notes (Signed)
Posterior anal fissure

## 2020-06-06 NOTE — Patient Instructions (Addendum)
Your exam shows an anal fissure. I recommend:  1. Eat 25-30 g of fiber in your diet through fruits, vegetables, and bran fibers 2.  Sitz bath after a bowel movement will help with the pain (epsome salt in a bathtub with warm water, sit in the warm water 10 minutes up to twice a day as needed) 3. Benefiber twice daily, titrate to a soft, formed stool 4. Nitroclygerin ointment applied to the rectum three times daily for the next 8 week 5. Limit your time on the toilet. Sit no longer than 5 minutes. Resist straining. 6. Before wiping after a bowel movement, swipe the paper into a jar of vaseline to give the paper a slight covering of the vaseline until fully healed. This will help reduce irriation and discomfort to the area. 7. Follow-up in 6-8 weeks with me or NP Colleen, earlier if needed.  8. Colonoscopy in 2022, earlier if symptoms are not improving  We have sent a prescription for nitroglycerin 0.125% gel to [facility]. You should apply a pea size amount to your rectum three times daily x 6-8 weeks.  [facility]'s information is below: Address: [address], [place], [place] [address]  [phone]  *Please DO NOT go directly from our office to pick up this medication! Give the pharmacy 1 day to process the prescription as this is compounded and takes time to make.  We have sent the following medications to your pharmacy for you to pick up at your convenience: Pantoprazole  How to Take a Sitz Bath A sitz bath is a warm water bath that may be used to care for your rectum, genital area, or the area between your rectum and genitals (perineum). For a sitz bath, the water only comes up to your hips and covers your buttocks. A sitz bath may done at home in a bathtub or with a portable sitz bath that fits over the toilet. Your health care provider may recommend a sitz bath to help:  Relieve pain and discomfort after delivering a baby.  Relieve pain and itching from  hemorrhoids or anal fissures.  Relieve pain after certain surgeries.  Relax muscles that are sore or tight. How to take a sitz bath Take 3-4 sitz baths a day, or as many as told by your health care provider. Bathtub sitz bath To take a sitz bath in a bathtub: 1. Partially fill a bathtub with warm water. The water should be deep enough to cover your hips and buttocks when you are sitting in the tub. 2. If your health care provider told you to put medicine in the water, follow his or her instructions. 3. Sit in the water. 4. Open the tub drain a little, and leave it open during your bath. 5. Turn on the warm water again, enough to replace the water that is draining out. Keep the water running throughout your bath. This helps keep the water at the right level and the right temperature. 6. Soak in the water for 15-20 minutes, or as long as told by your health care provider. 7. When you are done, be careful when you stand up. You may feel dizzy. 8. After the sitz bath, pat yourself dry. Do not rub your skin to dry it.  Over-the-toilet sitz bath To take a sitz bath with an over-the-toilet basin: 1. Follow the manufacturer's instructions. 2. Fill the basin with warm water. 3. If your health care provider told you to put medicine in the water, follow  his or her instructions. 4. Sit on the seat. Make sure the water covers your buttocks and perineum. 5. Soak in the water for 15-20 minutes, or as long as told by your health care provider. 6. After the sitz bath, pat yourself dry. Do not rub your skin to dry it. 7. Clean and dry the basin between uses. 8. Discard the basin if it cracks, or according to the manufacturer's instructions. Contact a health care provider if:  Your symptoms get worse. Do not continue with sitz baths if your symptoms get worse.  You have new symptoms. If this happens, do not continue with sitz baths until you talk with your health care provider. Summary  A sitz bath  is a warm water bath in which the water only comes up to your hips and covers your buttocks.  A sitz bath may help relieve itching, relieve pain, and relax muscles that are sore or tight in the lower part of your body, including your genital area.  Take 3-4 sitz baths a day, or as many as told by your health care provider. Soak in the water for 15-20 minutes.  Do not continue with sitz baths if your symptoms get worse. This information is not intended to replace advice given to you by your health care provider. Make sure you discuss any questions you have with your health care provider. Document Revised: 04/02/2019 Document Reviewed: 11/03/2017 Elsevier Patient Education  2020 [place].  Referral has been sent to PCP - Dr Billey Gosling- their office will contact you with an appointment.   Thank you for choosing me and [facility].  Dr. Thornton Park

## 2020-06-06 NOTE — Progress Notes (Signed)
Referring Provider: No ref. provider found Primary Care Physician:  Patient, No Pcp Per  Chief complaint:  Perirectal pain   IMPRESSION:  Posterior anal fissure seen on exam today History of colon polyps    -One tubular adenoma and one mucosal prolapse polyp on colonoscopy 2015    -Dr. Maurene Capes recommended surveillance in 5 years    -Dr. Nyoka Cowden changed the recommendation for surveillance to 7 years based on new MSFT guidelines Internal hemorrhoids Lactose intolerance No known family history of colon cancer or polyps   Posterior fissure seen on examination today. Will resume treatment. I offered an earlier colonoscopy today to exclude other causes of symptoms but she declined.     PLAN: Continue pantoprazole (refilled today) Increase Benefiber to twice daily Continue to use [employer]	[place] as needed Sitz baths two to three times daily for pain relief High fiber diet - increasing both dietary fiber (35 grams daily) and water intake  Nitroglycerin 0.125% gel applied to the rectum TID x 8 weeks Return to clinic in 6-8 weeks to see me or Colleen Colonoscopy in 2022 Referral to [facility] to establish a PCP  I spent 30  minutes, including in depth chart review, independent review of results, communicating results with the patient directly, face-to-face time with the patient, coordinating care, and ordering studies and medications as appropriate, and documentation.   HPI: Jocelyn [name] is a 64 y.o. female self-referred for further evaluation of rectal pain.  She was previously under the care of Dr. Olevia Perches,  last seen in 2016. I met her 09/25/19 when she was seen for a posterior anal fissure. She returns now at an urgent request due to symptoms.  She has a history of obesity, diabetes, hypertension, hyperlipidemia, coronary artery disease, and sarcoidosis.  She had an upper endoscopy and colonoscopy in July 2015.  At the time of colonoscopy 2 polyps were removed, one was a tubular  adenoma.  The other was a hyperplastic polyp.  She had a hyperplastic gastric polyp as well. She was seen for hemorrhoids in 2016. Initial surveillance recommendations were for 5 years, but, Dr. Silverio Decamp changed the recommendation to 7 based on the new multisocietal task force.  Two weeks of symptoms of rectal itching and perianal irritation. Ate onions last week. Has had irritation at the rectum since that time. Took Benadryl because the itching was so severe it disrupted her sleep. Concerned that a painful bump or area of swelling has developed. No blood or mucous. No constipation or straining. No foreign body or trauma. No fevers, chills, or warmth at the anus.  No other associated symptoms. No identified exacerbating or relieving features.    Past Medical History:  Diagnosis Date  . Acute bronchitis   . Allergic rhinitis, cause unspecified   . Anemia   . Anxiety   . B12 deficiency   . BV (bacterial vaginosis) 06/22/1996  . Calculus of kidney   . Calculus of kidney   . Coronary atherosclerosis of unspecified type of vessel, native or graft   . Dizziness   . Essential hypertension, benign   . Fibroid 2003  . Fibromyalgia   . H/O dysmenorrhea 2008  . H/O varicella   . Headache(784.0)    frequently  . HSV-2 infection 2009  . Hyperplastic colon polyp 05/16/2014  . Irritable bowel syndrome   . Meniere's disease, unspecified   . Menses, irregular 2003  . Myalgia and myositis, unspecified   . Obstructive sleep apnea (adult) (pediatric)   .  Perimenopausal symptoms 2003  . Pure hypercholesterolemia   . Sarcoidosis   . Type II or unspecified type diabetes mellitus without mention of complication, not stated as uncontrolled   . Unspecified venous (peripheral) insufficiency   . Vitamin D deficiency   . Vulvitis 2010  . Yeast infection     Past Surgical History:  Procedure Laterality Date  . COLONOSCOPY    . CORONARY STENT INTERVENTION N/A 06/15/2017   Procedure: CORONARY STENT  INTERVENTION;  Surgeon: Jettie Booze, MD;  Location: [facility];  Service: Cardiovascular;  Laterality: N/A;  . heart catherization    . LEFT HEART CATH AND CORONARY ANGIOGRAPHY N/A 06/15/2017   Procedure: LEFT HEART CATH AND CORONARY ANGIOGRAPHY;  Surgeon: Jettie Booze, MD;  Location: [facility];  Service: Cardiovascular;  Laterality: N/A;  . TONSILLECTOMY      Current Outpatient Medications  Medication Sig Dispense Refill  . Alirocumab (PRALUENT) 150 MG/ML SOAJ Inject 1 pen into the skin every 14 (fourteen) days. 2 pen 11  . ALPRAZolam (XANAX) 0.5 MG tablet TAKE 1/2 TABLET IN THE MORNING AND AFTERNOON, AND 1 TABLET AT BEDTIME 60 tablet 1  . azaTHIOprine (IMURAN) 50 MG tablet TAKE TWO TABLETS IN THE MORNING BY MOUTH AND ONE TABLET AT NIGHT AS DIRECTED    . baclofen (LIORESAL) 10 MG tablet Take 0.5-1 tablets (5-10 mg total) by mouth 3 (three) times daily as needed for muscle spasms. 30 each 3  . Bempedoic Acid (NEXLETOL) 180 MG TABS Take 1 tablet by mouth daily. 90 tablet 3  . clopidogrel (PLAVIX) 75 MG tablet Take 1 tablet (75 mg total) by mouth daily. 90 tablet 3  . diltiazem (CARDIZEM CD) 240 MG 24 hr capsule Take 1 capsule (240 mg total) by mouth daily. 90 capsule 3  . Empagliflozin-metFORMIN HCl ER (SYNJARDY XR) 25-1000 MG TB24 Take 1 tablet by mouth daily.    . Glucosamine Sulfate 1000 MG CAPS Take 1 capsule (1,000 mg total) by mouth 2 (two) times daily. 180 capsule 3  . HP ACTHAR 80 UNIT/ML injectable gel Inject 80 Units into the skin 3 (three) times a week.     . icosapent Ethyl (VASCEPA) 1 g capsule Take 2 capsules (2 g total) by mouth 2 (two) times daily. 120 capsule 10  . losartan (COZAAR) 100 MG tablet Take 1 tablet (100 mg total) by mouth daily. 90 tablet 3  . metoprolol tartrate (LOPRESSOR) 50 MG tablet Take 1 tablet (50 mg total) by mouth 2 (two) times daily. 180 tablet 3  . nitroGLYCERIN (NITROSTAT) 0.4 MG SL tablet Place 1 tablet (0.4 mg total)  under the tongue every 5 (five) minutes as needed for chest pain. 25 tablet 2  . ondansetron (ZOFRAN-ODT) 4 MG disintegrating tablet Take 1 tablet (4 mg total) by mouth every 8 (eight) hours as needed for nausea or vomiting. 20 tablet 1  . ONETOUCH VERIO test strip CHECK BLOOD SUGAR TID AS DIRECTED    . pantoprazole (PROTONIX) 40 MG tablet Take 1 tablet (40 mg total) by mouth daily. 90 tablet 3  . spironolactone (ALDACTONE) 25 MG tablet Take 1 tablet (25 mg total) by mouth daily. 90 tablet 3  . traMADol (ULTRAM) 50 MG tablet Take 1 tablet (50 mg total) by mouth 3 (three) times daily as needed. 90 tablet 0  . valACYclovir (VALTREX) 500 MG tablet Take 500 mg by mouth daily.      No current facility-administered medications for this visit.  Allergies as of 06/06/2020 - Review Complete 06/06/2020  Allergen Reaction Noted  . Actos [pioglitazone] Other (See Comments)   . Codeine Other (See Comments)   . Januvia [sitagliptin] Nausea Only 04/12/2014  . Lactose intolerance (gi) Diarrhea 05/16/2014  . Morphine Nausea Only and Nausea And Vomiting     Family History  Adopted: Yes  Problem Relation Age of Onset  . Other Other        ADOPTED  . Colon cancer Neg Hx   . Esophageal cancer Neg Hx   . Rectal cancer Neg Hx   . Stomach cancer Neg Hx     Social History   Socioeconomic History  . Marital status: Single    Spouse name: Not on file  . Number of children: 1  . Years of education: Not on file  . Highest education level: Not on file  Occupational History  . Occupation: Retired Pharmacist, hospital   Tobacco Use  . Smoking status: Never Smoker  . Smokeless tobacco: Never Used  . Tobacco comment: Daily Caffeine - 1  Exercise 2-3 times/weekly  Vaping Use  . Vaping Use: Never used  Substance and Sexual Activity  . Alcohol use: No    Alcohol/week: 0.0 standard drinks  . Drug use: No  . Sexual activity: Yes    Birth control/protection: None  Other Topics Concern  . Not on file  Social  History Narrative   Exercises 2-3 times weekly   Caffeine Use: 1 daily (tea or Pepsi)   Lives alone in a one story home.  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     Social Determinants of Health   Financial Resource Strain:   . Difficulty of Paying Living Expenses:   Food Insecurity:   . Worried About Charity fundraiser in the Last Year:   . Arboriculturist in the Last Year:   Transportation Needs:   . Film/video editor (Medical):   [name] Lack of Transportation (Non-Medical):   Physical Activity:   . Days of Exercise per Week:   . Minutes of Exercise per Session:   Stress:   . Feeling of Stress :   Social Connections:   . Frequency of Communication with Friends and Family:   . Frequency of Social Gatherings with Friends and Family:   . Attends Religious Services:   . Active Member of Clubs or Organizations:   . Attends Archivist Meetings:   [name] Marital Status:   Intimate Partner Violence:   . Fear of Current or Ex-Partner:   . Emotionally Abused:   [name] Physically Abused:   . Sexually Abused:     Physical Exam: General:   Alert,  well-nourished, pleasant and cooperative in NAD Abdomen:  Soft,nontender, nondistended, normal bowel sounds, no rebound or guarding. No hepatosplenomegaly.   Rectal:   No chemical dermatitis. Small, non-thrombosed external hemorrhoids. A non-bleeding posterior fissure is present. No fistula. No prolapsing hemorrhoids. No rectal prolapse. Normal anocutaneous reflex. No stool in the rectal vault. No mass or fecal impaction. Normal anal resting tone.  Chaperone: Rovonda Msk:  Symmetrical. No boney deformities LAD: No inguinal or umbilical LAD Extremities:  No clubbing or edema. Neurologic:  Alert and  oriented x4;  grossly nonfocal Skin:  Intact without significant lesions or rashes. Psych:  Alert and cooperative. Normal mood and affect.     Senaida Chilcote L. Tarri Glenn, MD, MPH 06/06/2020, 9:54 AM

## 2020-06-19 DIAGNOSIS — E1169 Type 2 diabetes mellitus with other specified complication: Secondary | ICD-10-CM | POA: Diagnosis not present

## 2020-06-19 DIAGNOSIS — I10 Essential (primary) hypertension: Secondary | ICD-10-CM | POA: Diagnosis not present

## 2020-06-25 DIAGNOSIS — Z6830 Body mass index (BMI) 30.0-30.9, adult: Secondary | ICD-10-CM | POA: Diagnosis not present

## 2020-06-25 DIAGNOSIS — J948 Other specified pleural conditions: Secondary | ICD-10-CM | POA: Diagnosis not present

## 2020-06-25 DIAGNOSIS — M25561 Pain in right knee: Secondary | ICD-10-CM | POA: Diagnosis not present

## 2020-06-25 DIAGNOSIS — E669 Obesity, unspecified: Secondary | ICD-10-CM | POA: Diagnosis not present

## 2020-06-25 DIAGNOSIS — Z79899 Other long term (current) drug therapy: Secondary | ICD-10-CM | POA: Diagnosis not present

## 2020-06-25 DIAGNOSIS — M3322 Polymyositis with myopathy: Secondary | ICD-10-CM | POA: Diagnosis not present

## 2020-06-25 DIAGNOSIS — M545 Low back pain: Secondary | ICD-10-CM | POA: Diagnosis not present

## 2020-06-27 ENCOUNTER — Encounter (HOSPITAL_COMMUNITY): Payer: Self-pay | Admitting: Emergency Medicine

## 2020-06-27 ENCOUNTER — Other Ambulatory Visit: Payer: Self-pay

## 2020-06-27 DIAGNOSIS — Z7901 Long term (current) use of anticoagulants: Secondary | ICD-10-CM | POA: Insufficient documentation

## 2020-06-27 DIAGNOSIS — I10 Essential (primary) hypertension: Secondary | ICD-10-CM | POA: Diagnosis not present

## 2020-06-27 DIAGNOSIS — Z79899 Other long term (current) drug therapy: Secondary | ICD-10-CM | POA: Diagnosis not present

## 2020-06-27 DIAGNOSIS — E119 Type 2 diabetes mellitus without complications: Secondary | ICD-10-CM | POA: Diagnosis not present

## 2020-06-27 DIAGNOSIS — R519 Headache, unspecified: Secondary | ICD-10-CM | POA: Insufficient documentation

## 2020-06-27 DIAGNOSIS — I2510 Atherosclerotic heart disease of native coronary artery without angina pectoris: Secondary | ICD-10-CM | POA: Insufficient documentation
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
## 2020-06-28 ENCOUNTER — Emergency Department (HOSPITAL_COMMUNITY)
Admission: EM | Admit: 2020-06-28 | Discharge: 2020-06-28 | Disposition: A | Discharge status: Home / Self Care | Payer: Medicare PPO | Attending: Emergency Medicine | Admitting: Emergency Medicine

## 2020-06-28 MED ORDER — METHOCARBAMOL 500 MG PO TABS
500.0000 mg | ORAL_TABLET | Freq: Two times a day (BID) | ORAL | 0 refills | Status: DC | PRN
Start: 2020-06-28 — End: 2020-07-08
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
## 2020-07-06 NOTE — Progress Notes (Signed)
Cardiology Office Note   Date:  07/08/2020   ID:  Jocelyn [name], DOB 1956-11-09, MRN [id number]  PCP:  Patient, No Pcp Per    No chief complaint on file.  CAD/Old MI  Wt Readings from Last 3 Encounters:  07/08/20 161 lb 9.6 oz (73.3 kg)  06/27/20 164 lb 4.8 oz (74.5 kg)  06/06/20 166 lb (75.3 kg)       History of Present Illness: Jocelyn [name] is a 64 y.o. female   With CAD. Cath manyyears ago Showed branch vessel disease. She has had difficulty tolerating statins. Eventually, she was put on Repatha.  She had an inferior STEMI on 06/15/2017. She received a drug-eluting stent to her distal right coronary artery. She had mild LAD disease. Her diagonal vessel disease has persisted over the years. Of note, she had severe radial spasm and it was noted that she had subclavian artery lusoria variant which made right radial approach quite difficult. This anatomic abnormality had been noted on a prior CT scan done at [facility].She had arm pain, heartburn, back pain when she had her MI.   Echo in 8/18 showed: Left ventricle: The cavity size was normal. There was mild concentric hypertrophy. Systolic function was normal. The estimated ejection fraction was in the range of 50% to 55%. Wall motion was normal; there were no regional wall motion abnormalities. Doppler parameters are consistent with abnormal left ventricular relaxation (grade 1 diastolic dysfunction). - Aortic valve: There was no regurgitation. - Mitral valve: There was mild regurgitation. - Right ventricle: The cavity size was normal. Wall thickness was normal. Systolic function was normal. - Atrial septum: No defect or patent foramen ovale was identified by color flow Doppler. - Tricuspid valve: There was no regurgitation.  She has had issues with HTN and headaches.THis seems to have resolved.  She had a pneumonia and pleural effusion in 10/18. It was bloody. Her Brilinta was stopped  at that time for thoracentesis.  Knee pain limits exercise. No prolonged walking due to knee.  Working at [employer].  Walks inside [employer].  In 2020,  She wore a BP monitor 24 hours, avg BP 138/79.   Denies : Chest pain. Dizziness. Leg edema. Nitroglycerin use. Orthopnea. Paroxysmal nocturnal dyspnea. Shortness of breath. Syncope.   No sx like her prior MI.     Past Medical History:  Diagnosis Date  . Acute bronchitis   . Allergic rhinitis, cause unspecified   . Anemia   . Anxiety   . B12 deficiency   . BV (bacterial vaginosis) 06/22/1996  . Calculus of kidney   . Calculus of kidney   . Coronary atherosclerosis of unspecified type of vessel, native or graft   . Dizziness   . Essential hypertension, benign   . Fibroid 2003  . Fibromyalgia   . H/O dysmenorrhea 2008  . H/O varicella   . Headache(784.0)    frequently  . HSV-2 infection 2009  . Hyperplastic colon polyp 05/16/2014  . Irritable bowel syndrome   . Meniere's disease, unspecified   . Menses, irregular 2003  . Myalgia and myositis, unspecified   . Obstructive sleep apnea (adult) (pediatric)   . Perimenopausal symptoms 2003  . Pure hypercholesterolemia   . Sarcoidosis   . Type II or unspecified type diabetes mellitus without mention of complication, not stated as uncontrolled   . Unspecified venous (peripheral) insufficiency   . Vitamin D deficiency   . Vulvitis 2010  . Yeast infection  Past Surgical History:  Procedure Laterality Date  . COLONOSCOPY    . CORONARY STENT INTERVENTION N/A 06/15/2017   Procedure: CORONARY STENT INTERVENTION;  Surgeon: Jettie Booze, MD;  Location: [facility];  Service: Cardiovascular;  Laterality: N/A;  . heart catherization    . LEFT HEART CATH AND CORONARY ANGIOGRAPHY N/A 06/15/2017   Procedure: LEFT HEART CATH AND CORONARY ANGIOGRAPHY;  Surgeon: Jettie Booze, MD;  Location: [facility];  Service: Cardiovascular;  Laterality: N/A;  .  TONSILLECTOMY       Current Outpatient Medications  Medication Sig Dispense Refill  . Alirocumab (PRALUENT) 150 MG/ML SOAJ Inject 1 pen into the skin every 14 (fourteen) days. 2 pen 11  . AMBULATORY NON FORMULARY MEDICATION .Nitroglycerine ointment 0.125 %  Apply a pea sized amount internally four times daily. Dispense 30 GM zero refill x 8 weeks 30 g 1  . azaTHIOprine (IMURAN) 50 MG tablet TAKE TWO TABLETS IN THE MORNING BY MOUTH AND ONE TABLET AT NIGHT AS DIRECTED    . baclofen (LIORESAL) 10 MG tablet Take 0.5-1 tablets (5-10 mg total) by mouth 3 (three) times daily as needed for muscle spasms. 30 each 3  . Bempedoic Acid (NEXLETOL) 180 MG TABS Take 1 tablet by mouth daily. 90 tablet 3  . clopidogrel (PLAVIX) 75 MG tablet Take 1 tablet (75 mg total) by mouth daily. 90 tablet 3  . diltiazem (CARDIZEM CD) 240 MG 24 hr capsule Take 1 capsule (240 mg total) by mouth daily. 90 capsule 3  . Empagliflozin-metFORMIN HCl ER (SYNJARDY XR) 25-1000 MG TB24 Take 1 tablet by mouth daily.    [name] icosapent Ethyl (VASCEPA) 1 g capsule Take 2 capsules (2 g total) by mouth 2 (two) times daily. 120 capsule 10  . losartan (COZAAR) 100 MG tablet Take 1 tablet (100 mg total) by mouth daily. 90 tablet 3  . metoprolol tartrate (LOPRESSOR) 50 MG tablet Take 1 tablet (50 mg total) by mouth 2 (two) times daily. 180 tablet 3  . nitroGLYCERIN (NITROSTAT) 0.4 MG SL tablet Place 1 tablet (0.4 mg total) under the tongue every 5 (five) minutes as needed for chest pain. 25 tablet 2  . ondansetron (ZOFRAN-ODT) 4 MG disintegrating tablet Take 1 tablet (4 mg total) by mouth every 8 (eight) hours as needed for nausea or vomiting. 20 tablet 1  . ONETOUCH VERIO test strip CHECK BLOOD SUGAR TID AS DIRECTED    . pantoprazole (PROTONIX) 40 MG tablet Take 1 tablet (40 mg total) by mouth daily. 90 tablet 3  . spironolactone (ALDACTONE) 25 MG tablet Take 1 tablet (25 mg total) by mouth daily. 90 tablet 3   No current  facility-administered medications for this visit.    Allergies:   Actos [pioglitazone], Codeine, Januvia [sitagliptin], Lactose intolerance (gi), and Morphine    Social History:  The patient  reports that she has never smoked. She has never used smokeless tobacco. She reports that she does not drink alcohol and does not use drugs.   Family History:  The patient's family history includes Other in an other family member. She was adopted.    ROS:  Please see the history of present illness.   Otherwise, review of systems are positive for knee pain.   All other systems are reviewed and negative.    PHYSICAL EXAM: VS:  BP 114/76   Pulse 84   Ht 5\' 2"  (1.575 m)   Wt 161 lb 9.6 oz (73.3 kg)   SpO2  96%   BMI 29.56 kg/m  , BMI Body mass index is 29.56 kg/m. GEN: Well nourished, well developed, in no acute distress  HEENT: normal  Neck: no JVD, carotid bruits, or masses Cardiac: RRR; no murmurs, rubs, or gallops,no edema  Respiratory:  clear to auscultation bilaterally, normal work of breathing GI: soft, nontender, nondistended, + BS MS: no deformity or atrophy  Skin: warm and dry, no rash Neuro:  Strength and sensation are intact Psych: euthymic mood, full affect   EKG:   The ekg ordered 10/2019 demonstrates NSR, inf Q waves, no ST changes   Recent Labs: 11/06/2019: BUN 13; Creatinine, Ser 0.70; Potassium 4.5; Sodium 146 04/16/2020: ALT 14   Lipid Panel    Component Value Date/Time   CHOL 228 (H) 04/16/2020 1505   TRIG 192 (H) 04/16/2020 1505   HDL 33 (L) 04/16/2020 1505   CHOLHDL 6.9 (H) 04/16/2020 1505   CHOLHDL 4 05/30/2018 1444   VLDL 36.2 05/30/2018 1444   LDLCALC 159 (H) 04/16/2020 1505   LDLDIRECT 91.6 08/14/2014 0822     Other studies Reviewed: Additional studies/ records that were reviewed today with results demonstrating: labs reviewed.     ASSESSMENT AND PLAN:  1. CAD/Old MI: No exertional angina.  No NTG use. Continue aggressive secondary  prevention. 2. Hyperlipidemia: On Praluent now.  Off of Zetia. Last LDL was 159, before she made the switch to Praluent. LDL after praluent, LDL 51.  Well controlled.  Whole food, plant based diet recommended.  TG elevated. Continue the Vascepa and bempedoic acid given TG elevation.  3. DM: A1C 6.6 in 8/21.whoe food plant based diet. Increase exercise.  4. HTN: Occasional BP spikes at home.   Low salt. 5. Palpitations: Worse at night. No syncope.  Feels like Heart racing at night.  Check Zio patch 3-14 days. 6. Would like to see dietician/nutritionist given her diabetes.  She cooks for herself.    Current medicines are reviewed at length with the patient today.  The patient concerns regarding her medicines were addressed.  The following changes have been made:  No change  Labs/ tests ordered today include:  No orders of the defined types were placed in this encounter.   Recommend 150 minutes/week of aerobic exercise Low fat, low carb, high fiber diet recommended  Disposition:   FU in 6 months  Signed, Larae Grooms, MD  07/08/2020 2:33 PM    [facility] [place], [place], [place]  [address] Phone: [phone]; Fax: [phone]

## 2020-07-08 ENCOUNTER — Other Ambulatory Visit: Payer: Self-pay

## 2020-07-08 ENCOUNTER — Ambulatory Visit: Payer: Medicare PPO | Admitting: Interventional Cardiology

## 2020-07-08 ENCOUNTER — Encounter: Payer: Self-pay | Admitting: Interventional Cardiology

## 2020-07-08 ENCOUNTER — Encounter: Payer: Self-pay | Admitting: *Deleted

## 2020-07-08 VITALS — BP 114/76 | HR 84 | Ht 62.0 in | Wt 161.6 lb

## 2020-07-08 DIAGNOSIS — I25118 Atherosclerotic heart disease of native coronary artery with other forms of angina pectoris: Secondary | ICD-10-CM | POA: Diagnosis not present

## 2020-07-08 DIAGNOSIS — R002 Palpitations: Secondary | ICD-10-CM | POA: Diagnosis not present

## 2020-07-08 DIAGNOSIS — I252 Old myocardial infarction: Secondary | ICD-10-CM | POA: Diagnosis not present

## 2020-07-08 DIAGNOSIS — I10 Essential (primary) hypertension: Secondary | ICD-10-CM | POA: Diagnosis not present

## 2020-07-08 DIAGNOSIS — E782 Mixed hyperlipidemia: Secondary | ICD-10-CM

## 2020-07-08 DIAGNOSIS — E1159 Type 2 diabetes mellitus with other circulatory complications: Secondary | ICD-10-CM

## 2020-07-08 NOTE — Patient Instructions (Addendum)
Medication Instructions:  Your physician recommends that you continue on your current medications as directed. Please refer to the Current Medication list given to you today.  *If you need a refill on your cardiac medications before your next appointment, please call your pharmacy*   Lab Work: None  If you have labs (blood work) drawn today and your tests are completely normal, you will receive your results only by: [name] MyChart Message (if you have MyChart) OR . A paper copy in the mail If you have any lab test that is abnormal or we need to change your treatment, we will call you to review the results.   Testing/Procedures: Your physician has recommended that you wear a 14 day monitor. These monitors are medical devices that record the heart's electrical activity. Doctors most often use these monitors to diagnose arrhythmias. Arrhythmias are problems with the speed or rhythm of the heartbeat. The monitor is a small, portable device. You can wear one while you do your normal daily activities. This is usually used to diagnose what is causing palpitations/syncope (passing out).   Follow-Up: You have been referred to a Nutritionist.   At [facility], you and your health needs are our priority.  As part of our continuing mission to provide you with exceptional heart care, we have created designated Provider Care Teams.  These Care Teams include your primary Cardiologist (physician) and Advanced Practice Providers (APPs -  Physician Assistants and Nurse Practitioners) who all work together to provide you with the care you need, when you need it.  We recommend signing up for the patient portal called "MyChart".  Sign up information is provided on this After Visit Summary.  MyChart is used to connect with patients for Virtual Visits (Telemedicine).  Patients are able to view lab/test results, encounter notes, upcoming appointments, etc.  Non-urgent messages can be sent to your provider as well.    To learn more about what you can do with MyChart, go to [email or web address].    Your next appointment:   6 month(s)  The format for your next appointment:   In Person  Provider:   You may see Larae Grooms, MD or one of the following Advanced Practice Providers on your designated Care Team:    Melina Copa, PA-C  Ermalinda Barrios, PA-C    Other Instructions Bryn Gulling- Long Term Monitor Instructions   Your physician has requested you wear your ZIO patch monitor 14 days. This is a single patch monitor.  [employer] supplies one patch monitor per enrollment.  Additional stickers are not available.   Please do not apply patch if you will be having a Nuclear Stress Test, Echocardiogram, Cardiac CT, MRI, or Chest Xray during the time frame you would be wearing the monitor. The patch cannot be worn during these tests.  You cannot remove and re-apply the ZIO XT patch monitor.   Your ZIO patch monitor will be sent USPS Priority mail from [facility] directly to your home address. The monitor may also be mailed to a PO BOX if home delivery is not available.   It may take 3-5 days to receive your monitor after you have been enrolled.   Once you have received you monitor, please review enclosed instructions.  Your monitor has already been registered assigning a specific monitor serial # to you.   Applying the monitor   Shave hair from upper left chest.   Hold abrader disc by orange tab.  Rub abrader in 40 strokes over left upper  chest as indicated in your monitor instructions.   Clean area with 4 enclosed alcohol pads .  Use all pads to assure are is cleaned thoroughly.  Let dry.   Apply patch as indicated in monitor instructions.  Patch will be place under collarbone on left side of chest with arrow pointing upward.   Rub patch adhesive wings for 2 minutes.Remove white label marked "1".  Remove white label marked "2".  Rub patch adhesive wings for 2 additional minutes.   While  looking in a mirror, press and release button in center of patch.  A small green light will flash 3-4 times .  This will be your only indicator the monitor has been turned on.     Do not shower for the first 24 hours.  You may shower after the first 24 hours.   Press button if you feel a symptom. You will hear a small click.  Record Date, Time and Symptom in the Patient Log Book.   When you are ready to remove patch, follow instructions on last 2 pages of Patient Log Book.  Stick patch monitor onto last page of Patient Log Book.   Place Patient Log Book in [place] box.  Use locking tab on box and tape box closed securely.  The [employer] and [employer] has [employer] on it.  Please place in mailbox as soon as possible.  Your physician should have your test results approximately 7 days after the monitor has been mailed back to [facility].   Call [facility] at [phone] if you have questions regarding your ZIO XT patch monitor.  Call them immediately if you see an orange light blinking on your monitor.   If your monitor falls off in less than 4 days contact our Monitor department at [phone].  If your monitor becomes loose or falls off after 4 days call [employer] at [phone] for suggestions on securing your monitor.

## 2020-07-08 NOTE — Progress Notes (Signed)
Patient ID: Jocelyn [name], female   DOB: August 06, 1956, 64 y.o.   MRN: [id number] Patient enrolled for [employer] to ship a 14 day ZIO XT long term holter monitor to her home.

## 2020-07-24 ENCOUNTER — Other Ambulatory Visit: Payer: Self-pay | Admitting: Interventional Cardiology

## 2020-07-28 ENCOUNTER — Ambulatory Visit (INDEPENDENT_AMBULATORY_CARE_PROVIDER_SITE_OTHER): Payer: Medicare PPO

## 2020-07-28 DIAGNOSIS — R002 Palpitations: Secondary | ICD-10-CM | POA: Diagnosis not present

## 2020-08-07 ENCOUNTER — Encounter: Payer: Medicare PPO | Attending: Interventional Cardiology | Admitting: Registered"

## 2020-08-12 DIAGNOSIS — R928 Other abnormal and inconclusive findings on diagnostic imaging of breast: Secondary | ICD-10-CM | POA: Diagnosis not present

## 2020-08-18 ENCOUNTER — Telehealth: Payer: Self-pay

## 2020-08-18 DIAGNOSIS — R002 Palpitations: Secondary | ICD-10-CM | POA: Diagnosis not present

## 2020-08-18 DIAGNOSIS — Z1239 Encounter for other screening for malignant neoplasm of breast: Secondary | ICD-10-CM | POA: Diagnosis not present

## 2020-08-18 DIAGNOSIS — B009 Herpesviral infection, unspecified: Secondary | ICD-10-CM | POA: Diagnosis not present

## 2020-08-18 DIAGNOSIS — L0231 Cutaneous abscess of buttock: Secondary | ICD-10-CM | POA: Diagnosis not present

## 2020-08-18 DIAGNOSIS — Z01419 Encounter for gynecological examination (general) (routine) without abnormal findings: Secondary | ICD-10-CM | POA: Diagnosis not present

## 2020-08-18 DIAGNOSIS — F419 Anxiety disorder, unspecified: Secondary | ICD-10-CM | POA: Diagnosis not present

## 2020-08-18 DIAGNOSIS — Z124 Encounter for screening for malignant neoplasm of cervix: Secondary | ICD-10-CM | POA: Diagnosis not present

## 2020-08-18 DIAGNOSIS — Z6829 Body mass index (BMI) 29.0-29.9, adult: Secondary | ICD-10-CM | POA: Diagnosis not present

## 2020-08-18 DIAGNOSIS — E7800 Pure hypercholesterolemia, unspecified: Secondary | ICD-10-CM

## 2020-08-18 NOTE — Telephone Encounter (Signed)
-----   Message from Ramond Dial, [place] sent at 08/18/2020  7:42 AM EDT -----  ----- Message ----- From: Marcelle Overlie D, RPH-CPP Sent: 08/18/2020 To: Ramond Dial, RPH-CPP  Set up Lipid labs

## 2020-08-18 NOTE — Telephone Encounter (Signed)
Called and scheduled lipid labs 

## 2020-08-19 ENCOUNTER — Ambulatory Visit: Payer: Medicare PPO | Admitting: Dietician

## 2020-08-22 ENCOUNTER — Other Ambulatory Visit: Payer: Self-pay

## 2020-08-22 ENCOUNTER — Other Ambulatory Visit: Payer: Medicare PPO | Admitting: *Deleted

## 2020-08-22 DIAGNOSIS — E7800 Pure hypercholesterolemia, unspecified: Secondary | ICD-10-CM

## 2020-08-23 LAB — LIPID PANEL
Chol/HDL Ratio: 4 ratio (ref 0.0–4.4)
Cholesterol, Total: 149 mg/dL (ref 100–199)
HDL: 37 mg/dL — ABNORMAL LOW (ref >39)
LDL Chol Calc (NIH): 91 mg/dL (ref 0–99)
Triglycerides: 113 mg/dL (ref 0–149)
VLDL Cholesterol Cal: 21 mg/dL (ref 5–40)

## 2020-08-25 ENCOUNTER — Telehealth: Payer: Self-pay | Admitting: Pharmacist

## 2020-08-25 NOTE — Telephone Encounter (Signed)
Spoke with patient to inform her of her improved lipid panel results on Vasepa, Praulent and Nexletol.   LDL 91, HDL 37, TG 113  Counseled patient that her lipid panel has improved, and to continue taking her current medications. She was encouraged by the results.   No side effects noted. Patient endorsed some generalized itching similar to what she experiences seasonally, also endorses bug bites on extremities. Timing of itching does not correlate to timing of Praulent injections, no rash or redness noted. Counseled patient to call if any worsening of itching, or if it correlates to medication timing.   Norina Buzzard, PharmD PGY1 Pharmacy Resident 08/25/2020 10:36 AM

## 2020-09-03 ENCOUNTER — Encounter: Payer: Self-pay | Admitting: Skilled Nursing Facility1

## 2020-09-03 ENCOUNTER — Other Ambulatory Visit: Payer: Self-pay

## 2020-09-03 ENCOUNTER — Encounter: Payer: Medicare PPO | Attending: Interventional Cardiology | Admitting: Skilled Nursing Facility1

## 2020-09-03 DIAGNOSIS — E119 Type 2 diabetes mellitus without complications: Secondary | ICD-10-CM | POA: Diagnosis not present

## 2020-09-03 NOTE — Progress Notes (Signed)
Assessment:  Primary concerns today: eating better.   Pt states she can eat cheese and yogurt is okay.  Pt states she needs the acid reducer to keep from heart burn occurring. Pt has had vitmain b12 and D defficiency in the past.  Pt states she wants to learn how to eat healthy. Pt states she has had diabetes for about 10 years. Pt states she checks her blood sugars every morning: 100-120. Pt states she is pretty sure she has never had a low blood sugar. Pt states she does check her feet every day. Pt states her A1C is 6.6.  Pt states shs feels she eats a lot of junk food. Pt state she feels 95% of her meals are eaten out.  Pt states she has trouble sleeping; getting around 7 hours but not feeling rested.  Pt states she used to be 200 pounds and then had her heart attack and her daughter forced her to make dietary changes.  Pt states she does not know how to cook and finds it to be a lot of effort. Pt states she hates grocery shopping.  Pt states she becomes easily overwhelmed by directions and recipes.   Body Composition Scale 09/03/2020  Current Body Weight 157.8  Total Body Fat % 37  Visceral Fat 10  Fat-Free Mass % 62.9   Total Body Water % 45.9  Muscle-Mass lbs 26.4  BMI 28.6  Body Fat Displacement          Torso  lbs 36         Left Leg  lbs 7.2         Right Leg  lbs 7.2         Left Arm  lbs 3.6         Right Arm   lbs 3.6    MEDICATIONS: see list   DIETARY INTAKE:  Usual eating pattern includes 3 meals and 0 snacks per day.  Everyday foods include none stated.  Avoided foods include none stated.    24-hr recall:  B ( AM): granola bar or fast food or grits and eggs Snk ( AM):  L ( PM): eating out: salad or sandwich Snk ( PM):  D ( PM): fast food Snk ( PM) Beverages: sweet tea, water, pepsi, hot chocolate  Usual physical activity: walking the dog 5 days a week: 10 minutes  Estimated energy needs: 1500 calories  Progress Towards Goal(s):  In progress.     Intervention:  Nutrition counseling. Dietitian educated pt on simple options to start making meals from home. Dietitian offered multiple options and resources for her to start making more foods from home.    Goals: When you get home set up online grocery shopping for [place] or [place] diabetes food hub for recipes Look into getting an air fryer  Read your instructions or recipe sentence by sentence  Ask one of your friends to show you how to make a dish Buy a glass dish to cook your chicken in: set oven to 425 degrees, pour sauce over chicken, cover dish in tin foil, cook for 35 minutes (be sure there is no pink in the middle Use frozen vegetables that you just put in the microwave Rice rice in bags you just put in microwave Make a sandwich  Teaching Method Utilized:  Visual Auditory Hands on  Handouts given during visit include:  Detailed MyPlate  Barriers to learning/adherence to lifestyle change: precontemplative stage of change  Demonstrated degree of  understanding via:  Teach Back   Monitoring/Evaluation:  Dietary intake, exercise, and body weight prn.

## 2020-09-25 DIAGNOSIS — E663 Overweight: Secondary | ICD-10-CM | POA: Diagnosis not present

## 2020-09-25 DIAGNOSIS — Z6829 Body mass index (BMI) 29.0-29.9, adult: Secondary | ICD-10-CM | POA: Diagnosis not present

## 2020-09-25 DIAGNOSIS — M5136 Other intervertebral disc degeneration, lumbar region: Secondary | ICD-10-CM | POA: Diagnosis not present

## 2020-09-25 DIAGNOSIS — M3322 Polymyositis with myopathy: Secondary | ICD-10-CM | POA: Diagnosis not present

## 2020-09-25 DIAGNOSIS — J948 Other specified pleural conditions: Secondary | ICD-10-CM | POA: Diagnosis not present

## 2020-09-25 DIAGNOSIS — M25561 Pain in right knee: Secondary | ICD-10-CM | POA: Diagnosis not present

## 2020-09-25 DIAGNOSIS — Z79899 Other long term (current) drug therapy: Secondary | ICD-10-CM | POA: Diagnosis not present

## 2020-10-13 ENCOUNTER — Other Ambulatory Visit: Payer: Self-pay

## 2020-10-13 ENCOUNTER — Encounter: Payer: Medicare PPO | Attending: Interventional Cardiology | Admitting: Skilled Nursing Facility1

## 2020-10-13 DIAGNOSIS — E669 Obesity, unspecified: Secondary | ICD-10-CM | POA: Diagnosis not present

## 2020-10-13 NOTE — Progress Notes (Signed)
°  Assessment:  Primary concerns today: eating better.   Pt states she is doing  more groceries  for vegetables and fruits and exploring recipies. Pt  States she is working on cooking food in [employer]. Pt states 50% of her meal she cooks at home(used to eat 95% of her meal out of home).  Body Composition Scale 09/03/2020 10/13/2020  Current Body Weight 157.8 154.3  Total Body Fat % 37 37.2  Visceral Fat 10 10  Fat-Free Mass % 62.9 62.7   Total Body Water % 45.9 45.8  Muscle-Mass lbs 26.4 25.6  BMI 28.6 28  Body Fat Displacement           Torso  lbs 36 35.4         Left Leg  lbs 7.2 7         Right Leg  lbs 7.2 7         Left Arm  lbs 3.6 3.5         Right Arm   lbs 3.6 3.5    MEDICATIONS: see list   DIETARY INTAKE:  Usual eating pattern includes 3 meals and 0 snacks per day.  Everyday foods include none stated.  Avoided foods include none stated.    24-hr recall:  B ( 11 AM):  Snk ( AM):  L (PM): sandwiches  or  Salads + chicken or  pita salad  Snk ( PM): Mini oranges 2-3 D ( PM):Meat and veggies  Snk ( PM) Beverages: water, tea, 1 regular pesi  Usual physical activity: walking the dog 5 days a week:  Walking  Estimated energy needs: 1500 calories  Progress Towards Goal(s):  In progress.    Intervention:  Nutrition counseling. Dietitian educated pt on simple options to start making meals from home. Dietitian offered multiple options and resources for her to start making more foods from home.    Goals: Start doing resistance exercises 2 times a week (try 2 lbs weight) and try youtube yoga videos  for beginners Check temperature of the food to know when its cooked   145F--whole cuts of beef, pork, lamb, and veal (stand-time of 3 minutes at this temperature)  145F--fish.  160F--hamburgers and other ground beef.  165F--all poultry and pre-cooked meats, like hot dogs. Try [place] videos for activity  Teaching Method Utilized:  Visual Auditory Hands  on  Handouts given during visit include:  Detailed MyPlate  Barriers to learning/adherence to lifestyle change: Action  Demonstrated degree of understanding via:  Teach Back   Monitoring/Evaluation:  Dietary intake, exercise, and body weight

## 2020-10-20 DIAGNOSIS — E1151 Type 2 diabetes mellitus with diabetic peripheral angiopathy without gangrene: Secondary | ICD-10-CM | POA: Diagnosis not present

## 2020-10-20 DIAGNOSIS — Z7984 Long term (current) use of oral hypoglycemic drugs: Secondary | ICD-10-CM | POA: Diagnosis not present

## 2020-10-20 DIAGNOSIS — D3502 Benign neoplasm of left adrenal gland: Secondary | ICD-10-CM | POA: Diagnosis not present

## 2020-10-20 DIAGNOSIS — I2510 Atherosclerotic heart disease of native coronary artery without angina pectoris: Secondary | ICD-10-CM | POA: Diagnosis not present

## 2020-10-20 DIAGNOSIS — E669 Obesity, unspecified: Secondary | ICD-10-CM | POA: Diagnosis not present

## 2020-11-10 ENCOUNTER — Telehealth: Payer: Self-pay | Admitting: Pharmacist

## 2020-11-10 NOTE — Telephone Encounter (Signed)
PA submitted for nexletol renewal.  Key [id number]

## 2020-11-11 NOTE — Telephone Encounter (Signed)
PA approved through 11/14/21. 

## 2020-11-28 ENCOUNTER — Telehealth: Payer: Self-pay | Admitting: Pharmacist

## 2020-11-28 NOTE — Telephone Encounter (Signed)
Patient called to report her grant had run out for cholesterol medications. Re enrolled patient over the phone.  Gave patient approval information over the phone

## 2020-12-17 ENCOUNTER — Other Ambulatory Visit: Payer: Self-pay | Admitting: Interventional Cardiology

## 2020-12-17 MED ORDER — NEXLETOL 180 MG PO TABS: 1.0000 | ORAL_TABLET | Freq: Every day | ORAL | 2 refills | Status: DC

## 2020-12-30 ENCOUNTER — Other Ambulatory Visit: Payer: Self-pay

## 2020-12-30 MED ORDER — DILTIAZEM HCL ER COATED BEADS 240 MG PO CP24: 240.0000 mg | ORAL_CAPSULE | Freq: Every day | ORAL | 2 refills | Status: DC

## 2021-01-12 ENCOUNTER — Telehealth: Payer: Self-pay | Admitting: Interventional Cardiology

## 2021-01-12 NOTE — Telephone Encounter (Signed)
Spoke with pt who states she has been feeling "weird " all weekend with "chest discomfort".  Pt states she is currently having chest discomfort/tightness and started having nausea and vomiting about 3am this morning. Vomited again at 6am. She has also had some sweating and HA.  Jocelyn [name] has only given minimal relief.    Pt advised with her current active CP and additional symptoms she should be evaluated in ED now.  Pt advised she should not drive herself and since she does not have someone readily available to drive her to call [phone].  Pt verbalizes understanding and agrees with current plan.

## 2021-01-12 NOTE — Telephone Encounter (Signed)
Pt c/o of Chest Pain: STAT if CP now or developed within 24 hours  1. Are you having CP right now? Chest pain and discomfort(now)  2. Are you experiencing any other symptoms (ex. SOB, nausea, vomiting, sweating)? Sweating, nausea vomiting and headache   3. How long have you been experiencing CP? Friday  4. Is your CP continuous or coming and going? Comes and goes  5. Have you taken Nitroglycerin? Yes= some relief?

## 2021-01-13 ENCOUNTER — Emergency Department (HOSPITAL_COMMUNITY): Payer: Medicare PPO

## 2021-01-13 ENCOUNTER — Emergency Department (HOSPITAL_COMMUNITY)
Admission: EM | Admit: 2021-01-13 | Discharge: 2021-01-13 | Disposition: A | Discharge status: Home / Self Care | Payer: Medicare PPO | Attending: Emergency Medicine | Admitting: Emergency Medicine

## 2021-01-13 ENCOUNTER — Encounter (HOSPITAL_COMMUNITY): Payer: Self-pay | Admitting: Emergency Medicine

## 2021-01-13 ENCOUNTER — Other Ambulatory Visit: Payer: Self-pay

## 2021-01-13 DIAGNOSIS — Z7902 Long term (current) use of antithrombotics/antiplatelets: Secondary | ICD-10-CM | POA: Diagnosis not present

## 2021-01-13 DIAGNOSIS — R42 Dizziness and giddiness: Secondary | ICD-10-CM | POA: Insufficient documentation

## 2021-01-13 DIAGNOSIS — I2510 Atherosclerotic heart disease of native coronary artery without angina pectoris: Secondary | ICD-10-CM | POA: Diagnosis not present

## 2021-01-13 DIAGNOSIS — I252 Old myocardial infarction: Secondary | ICD-10-CM | POA: Diagnosis not present

## 2021-01-13 DIAGNOSIS — Z79899 Other long term (current) drug therapy: Secondary | ICD-10-CM | POA: Diagnosis not present

## 2021-01-13 DIAGNOSIS — R519 Headache, unspecified: Secondary | ICD-10-CM

## 2021-01-13 DIAGNOSIS — I10 Essential (primary) hypertension: Secondary | ICD-10-CM | POA: Insufficient documentation

## 2021-01-13 DIAGNOSIS — R110 Nausea: Secondary | ICD-10-CM | POA: Diagnosis not present

## 2021-01-13 DIAGNOSIS — R079 Chest pain, unspecified: Secondary | ICD-10-CM | POA: Insufficient documentation

## 2021-01-13 DIAGNOSIS — R0789 Other chest pain: Secondary | ICD-10-CM | POA: Diagnosis not present

## 2021-01-13 DIAGNOSIS — E119 Type 2 diabetes mellitus without complications: Secondary | ICD-10-CM | POA: Insufficient documentation

## 2021-01-13 DIAGNOSIS — R61 Generalized hyperhidrosis: Secondary | ICD-10-CM | POA: Insufficient documentation

## 2021-01-13 LAB — LIPASE, BLOOD: Lipase: 50 U/L (ref 11–51)

## 2021-01-13 LAB — CBC
HCT: 39.2 % (ref 36.0–46.0)
Hemoglobin: 12.9 g/dL (ref 12.0–15.0)
MCH: 31.2 pg (ref 26.0–34.0)
MCHC: 32.9 g/dL (ref 30.0–36.0)
MCV: 94.9 fL (ref 80.0–100.0)
Platelets: 327 10*3/uL (ref 150–400)
RBC: 4.13 MIL/uL (ref 3.87–5.11)
RDW: 13.6 % (ref 11.5–15.5)
WBC: 4.3 10*3/uL (ref 4.0–10.5)
nRBC: 0 % (ref 0.0–0.2)

## 2021-01-13 LAB — HEPATIC FUNCTION PANEL
ALT: 13 U/L (ref 0–44)
AST: 22 U/L (ref 15–41)
Albumin: 3.9 g/dL (ref 3.5–5.0)
Alkaline Phosphatase: 58 U/L (ref 38–126)
Bilirubin, Direct: 0.1 mg/dL (ref 0.0–0.2)
Indirect Bilirubin: 0.6 mg/dL (ref 0.3–0.9)
Total Bilirubin: 0.7 mg/dL (ref 0.3–1.2)
Total Protein: 6.8 g/dL (ref 6.5–8.1)

## 2021-01-13 LAB — BASIC METABOLIC PANEL
Anion gap: 8 (ref 5–15)
BUN: 15 mg/dL (ref 8–23)
CO2: 27 mmol/L (ref 22–32)
Calcium: 9.5 mg/dL (ref 8.9–10.3)
Chloride: 105 mmol/L (ref 98–111)
Creatinine, Ser: 0.87 mg/dL (ref 0.44–1.00)
GFR, Estimated: >60 mL/min (ref >60)
Glucose, Bld: 116 mg/dL — ABNORMAL HIGH (ref 70–99)
Potassium: 4.3 mmol/L (ref 3.5–5.1)
Sodium: 140 mmol/L (ref 135–145)

## 2021-01-13 LAB — TROPONIN I (HIGH SENSITIVITY)
Troponin I (High Sensitivity): 6 ng/L (ref <18)
Troponin I (High Sensitivity): 6 ng/L (ref <18)

## 2021-01-13 MED ORDER — ONDANSETRON HCL 4 MG/2ML IJ SOLN
4.0000 mg | Freq: Once | INTRAMUSCULAR | Status: AC
  Administered 2021-01-13: 4 mg via INTRAVENOUS
  Filled 2021-01-13: qty 2

## 2021-01-13 MED ORDER — ONDANSETRON 8 MG PO TBDP: 8.0000 mg | ORAL_TABLET | Freq: Three times a day (TID) | ORAL | 0 refills | Status: DC | PRN

## 2021-01-13 MED ORDER — LOSARTAN POTASSIUM 50 MG PO TABS
100.0000 mg | ORAL_TABLET | Freq: Once | ORAL | Status: AC
  Administered 2021-01-13: 100 mg via ORAL
  Filled 2021-01-13: qty 2

## 2021-01-13 MED ORDER — FENTANYL CITRATE (PF) 100 MCG/2ML IJ SOLN
50.0000 ug | Freq: Once | INTRAMUSCULAR | Status: AC
  Administered 2021-01-13: 50 ug via INTRAVENOUS
  Filled 2021-01-13: qty 2

## 2021-01-13 NOTE — ED Notes (Signed)
Pt transported to CT ?

## 2021-01-13 NOTE — ED Provider Notes (Signed)
[facility] DEPART[facility] Provider Note   CSN: [id number] Arrival date & time: 01/13/21  1433     History Chief Complaint  Patient presents with  . Chest Pain  . Dizziness    Jocelyn [name] is a 65 y.o. female.  HPI  HPI: A 65 year old patient with a history of treated diabetes and hypertension presents for evaluation of chest pain. Initial onset of pain was more than 6 hours ago. The patient's chest pain is described as heaviness/pressure/tightness, is not worse with exertion and is relieved by nitroglycerin. The patient complains of nausea and reports some diaphoresis. The patient's chest pain is middle- or left-sided, is not well-localized, is not sharp and does radiate to the arms/jaw/neck. The patient has no history of stroke, has no history of peripheral artery disease, has not smoked in the past 90 days, has no relevant family history of coronary artery disease (first degree relative at less than age 37), has no history of hypercholesterolemia and does not have an elevated BMI (>=30). Started on Sunday.  Episodes off and on.  Comes and goes.  Pt will have nausea and headache with her symptoms as well.  She did have vomiting on Monday.  No episodes today.   Pt felt like this in the past when she had her heart attack.  She called the cardiologist office yesterday.  They instructed her go to the ED.  Past Medical History:  Diagnosis Date  . Acute bronchitis   . Allergic rhinitis, cause unspecified   . Anemia   . Anxiety   . B12 deficiency   . BV (bacterial vaginosis) 06/22/1996  . Calculus of kidney   . Calculus of kidney   . Coronary atherosclerosis of unspecified type of vessel, native or graft   . Dizziness   . Essential hypertension, benign   . Fibroid 2003  . Fibromyalgia   . H/O dysmenorrhea 2008  . H/O varicella   . Headache(784.0)    frequently  . HSV-2 infection 2009  . Hyperplastic colon polyp 05/16/2014  . Irritable bowel syndrome   .  Meniere's disease, unspecified   . Menses, irregular 2003  . Myalgia and myositis, unspecified   . Myocardial infarction ([place])   . Obstructive sleep apnea (adult) (pediatric)   . Perimenopausal symptoms 2003  . Pure hypercholesterolemia   . Sarcoidosis   . Type II or unspecified type diabetes mellitus without mention of complication, not stated as uncontrolled   . Unspecified venous (peripheral) insufficiency   . Vitamin D deficiency   . Vulvitis 2010  . Yeast infection     Patient Active Problem List   Diagnosis Date Noted  . Hx of pleural effusion   . Dyspnea 08/16/2018  . Fall on or from sidewalk curb, initial encounter 05/31/2018  . History of acute inferior wall MI 08/22/2017  . Diabetes mellitus ([place])   . S/P right coronary artery (RCA) stent placement   . Overweight 05/05/2017  . Diabetes mellitus type 2, controlled, without complications ([place]) 40/98/1191  . Asthmatic bronchitis , chronic ([place]) 05/10/2016  . Polymyositis ([place]) 09/15/2015  . History of sarcoidosis 09/15/2015  . Falling 03/13/2015  . Edema 02/13/2014  . Coronary artery disease involving native heart without angina pectoris 10/29/2013  . Hyperlipidemia 10/29/2013  . Iron (Fe) deficiency anemia 08/08/2013  . Vitamin B 12 deficiency 08/08/2013  . GERD (gastroesophageal reflux disease) 09/08/2011  . Weakness 02/22/2011  . Depression 02/22/2011  . [place] DISEASE 04/30/2009  . Vitamin D  deficiency 02/05/2009  . Venous (peripheral) insufficiency 12/12/2007  . RENAL CALCULUS 12/12/2007  . DIZZINESS 12/12/2007  . HYPERCHOLESTEROLEMIA 09/16/2007  . Anxiety state 09/16/2007  . HYPERTENSION, BENIGN 09/16/2007  . Allergic rhinitis 09/16/2007  . IRRITABLE BOWEL SYNDROME 09/16/2007  . Myalgia and myositis 09/16/2007    Past Surgical History:  Procedure Laterality Date  . COLONOSCOPY    . CORONARY STENT INTERVENTION N/A 06/15/2017   Procedure: CORONARY STENT INTERVENTION;  Surgeon: Jettie Booze,  MD;  Location: [facility];  Service: Cardiovascular;  Laterality: N/A;  . heart catherization    . LEFT HEART CATH AND CORONARY ANGIOGRAPHY N/A 06/15/2017   Procedure: LEFT HEART CATH AND CORONARY ANGIOGRAPHY;  Surgeon: Jettie Booze, MD;  Location: [facility];  Service: Cardiovascular;  Laterality: N/A;  . TONSILLECTOMY       OB History    Gravida  1   Para  1   Term  1   Preterm      AB      Living  1     SAB      IAB      Ectopic      Multiple      Live Births  1           Family History  Adopted: Yes  Problem Relation Age of Onset  . Other Other        ADOPTED  . Colon cancer Neg Hx   . Esophageal cancer Neg Hx   . Rectal cancer Neg Hx   . Stomach cancer Neg Hx     Social History   Tobacco Use  . Smoking status: Never Smoker  . Smokeless tobacco: Never Used  . Tobacco comment: Daily Caffeine - 1  Exercise 2-3 times/weekly  Vaping Use  . Vaping Use: Never used  Substance Use Topics  . Alcohol use: No    Alcohol/week: 0.0 standard drinks  . Drug use: No    Home Medications Prior to Admission medications   Medication Sig Start Date End Date Taking? Authorizing Provider  ondansetron (ZOFRAN ODT) 8 MG disintegrating tablet Take 1 tablet (8 mg total) by mouth every 8 (eight) hours as needed for nausea or vomiting. 01/13/21  Yes Dorie Rank, MD  Alirocumab (PRALUENT) 150 MG/ML SOAJ Inject 1 pen into the skin every 14 (fourteen) days. 04/18/20   Jettie Booze, MD  AMBULATORY NON FORMULARY MEDICATION .Nitroglycerine ointment 0.125 %  Apply a pea sized amount internally four times daily. Dispense 30 GM zero refill x 8 weeks 06/06/20   Thornton Park, MD  azaTHIOprine (IMURAN) 50 MG tablet TAKE TWO TABLETS IN THE MORNING BY MOUTH AND ONE TABLET AT NIGHT AS DIRECTED 10/08/19   [provider]  baclofen (LIORESAL) 10 MG tablet Take 0.5-1 tablets (5-10 mg total) by mouth 3 (three) times daily as needed for muscle spasms.  04/11/20   Hilts, Legrand Como, MD  Bempedoic Acid (NEXLETOL) 180 MG TABS Take 1 tablet by mouth daily. 12/17/20   Jettie Booze, MD  clopidogrel (PLAVIX) 75 MG tablet Take 1 tablet (75 mg total) by mouth daily. 10/19/19   Jettie Booze, MD  diltiazem (CARDIZEM CD) 240 MG 24 hr capsule Take 1 capsule (240 mg total) by mouth daily. 12/30/20   Jettie Booze, MD  Empagliflozin-metFORMIN HCl ER (SYNJARDY XR) 25-1000 MG TB24 Take 1 tablet by mouth daily.    [provider]  icosapent Ethyl (VASCEPA) 1 g capsule Take 2  capsules (2 g total) by mouth 2 (two) times daily. 02/26/20   Jettie Booze, MD  losartan (COZAAR) 100 MG tablet Take 1 tablet (100 mg total) by mouth daily. 02/04/20   Jettie Booze, MD  metoprolol tartrate (LOPRESSOR) 50 MG tablet Take 1 tablet (50 mg total) by mouth 2 (two) times daily. 01/07/20   Jettie Booze, MD  nitroGLYCERIN (NITROSTAT) 0.4 MG SL tablet Place 1 tablet (0.4 mg total) under the tongue every 5 (five) minutes as needed for chest pain. 03/24/20   Jettie Booze, MD  ONETOUCH VERIO test strip CHECK BLOOD SUGAR TID AS DIRECTED 10/04/19   [provider]  pantoprazole (PROTONIX) 40 MG tablet Take 1 tablet (40 mg total) by mouth daily. 06/06/20   Thornton Park, MD  spironolactone (ALDACTONE) 25 MG tablet TAKE 1 TABLET(25 MG) BY MOUTH DAILY 07/24/20   Jettie Booze, MD    Allergies    Actos [pioglitazone], Codeine, Januvia [sitagliptin], Lactose intolerance (gi), and Morphine  Review of Systems   Review of Systems  All other systems reviewed and are negative.   Physical Exam Updated Vital Signs BP (!) 166/111   Pulse 73   Temp 98.1 F (36.7 C) (Oral)   Resp 18   Ht 1.575 m (5\' 2" )   Wt 70.3 kg   SpO2 97%   BMI 28.35 kg/m   Physical Exam Vitals and nursing note reviewed.  Constitutional:      General: She is not in acute distress.    Appearance: She is well-developed and well-nourished.  HENT:      Head: Normocephalic and atraumatic.     Right Ear: External ear normal.     Left Ear: External ear normal.  Eyes:     General: No scleral icterus.       Right eye: No discharge.        Left eye: No discharge.     Conjunctiva/sclera: Conjunctivae normal.  Neck:     Trachea: No tracheal deviation.  Cardiovascular:     Rate and Rhythm: Normal rate and regular rhythm.     Pulses: Intact distal pulses.  Pulmonary:     Effort: Pulmonary effort is normal. No respiratory distress.     Breath sounds: Normal breath sounds. No stridor. No wheezing or rales.  Abdominal:     General: Bowel sounds are normal. There is no distension.     Palpations: Abdomen is soft.     Tenderness: There is no abdominal tenderness. There is no guarding or rebound.  Musculoskeletal:        General: No tenderness or edema.     Cervical back: Neck supple.  Skin:    General: Skin is warm and dry.     Findings: No rash.  Neurological:     Mental Status: She is alert.     Cranial Nerves: No cranial nerve deficit (no facial droop, extraocular movements intact, no slurred speech).     Sensory: No sensory deficit.     Motor: No abnormal muscle tone or seizure activity.     Coordination: Coordination normal.     Deep Tendon Reflexes: Strength normal.  Psychiatric:        Mood and Affect: Mood and affect normal.     ED Results / Procedures / Treatments   Labs (all labs ordered are listed, but only abnormal results are displayed) Labs Reviewed  BASIC METABOLIC PANEL - Abnormal; Notable for the following components:      Result  Value   Glucose, Bld 116 (*)    All other components within normal limits  CBC  LIPASE, BLOOD  HEPATIC FUNCTION PANEL  TROPONIN I (HIGH SENSITIVITY)  TROPONIN I (HIGH SENSITIVITY)    EKG EKG Interpretation  Date/Time:  Tuesday January 13 2021 16:39:03 EST Ventricular Rate:  68 PR Interval:  198 QRS Duration: 88 QT Interval:  390 QTC Calculation: 414 R Axis:   1 Text  Interpretation: Normal sinus rhythm Possible Inferior infarct , age undetermined Anterolateral infarct , age undetermined Abnormal ECG inferior t wave changes more prominent since last tracing Confirmed by Dorie Rank [phone]) on 01/13/2021 5:27:22 PM   Radiology DG Chest 2 View  Result Date: 01/13/2021 CLINICAL DATA:  Chest pain and dizziness. EXAM: CHEST - 2 VIEW COMPARISON:  October 23, 2018 FINDINGS: The cardiac silhouette is mildly enlarged. Both lungs are clear. Radiopaque surgical clips are seen within the right upper quadrant. The visualized skeletal structures are unremarkable. IMPRESSION: No active cardiopulmonary disease. Electronically Signed   By: Virgina Norfolk M.D.   On: 01/13/2021 17:28   CT Head Wo Contrast  Result Date: 01/13/2021 CLINICAL DATA:  Chest pain and dizziness. EXAM: CT HEAD WITHOUT CONTRAST TECHNIQUE: Contiguous axial images were obtained from the base of the skull through the vertex without intravenous contrast. COMPARISON:  May 31, 2018 FINDINGS: Brain: No evidence of acute infarction, hemorrhage, hydrocephalus, extra-axial collection or mass lesion/mass effect. A stable 6 mm calcified meningioma is seen along the left vertex. Vascular: No hyperdense vessels are identified. Skull: Normal. Negative for fracture or focal lesion. Sinuses/Orbits: No acute finding. Other: None. IMPRESSION: 1. No acute intracranial abnormality. 2. Stable calcified meningioma along the left vertex. Electronically Signed   By: Virgina Norfolk M.D.   On: 01/13/2021 22:38    Procedures Procedures   Medications Ordered in ED Medications  ondansetron (ZOFRAN) injection 4 mg (4 mg Intravenous Given 01/13/21 1820)  fentaNYL (SUBLIMAZE) injection 50 mcg (50 mcg Intravenous Given 01/13/21 2126)  losartan (COZAAR) tablet 100 mg (100 mg Oral Given 01/13/21 2241)    ED Course  I have reviewed the triage vital signs and the nursing notes.  Pertinent labs & imaging results that were available during my  care of the patient were reviewed by me and considered in my medical decision making (see chart for details).  Clinical Course as of 01/13/21 2310  Tue Jan 13, 2021  1757 CXR normal. [JK]    Clinical Course User Index [JK] Dorie Rank, MD   MDM Rules/Calculators/A&P HEAR Score: 7                        Patient presented to ED with complaints of chest pain.  She has also had some symptoms of lightheadedness and headache.  Patient has history of coronary artery disease status post stents.  Last cardiac cath appears to be in 2018.  Patient is presenting with chest pain that is concerning for angina.  She has a high risk heart score of 7.  I think she would benefit from further cardiac evaluation.  I will consult with cardiology.    Pt seen by cardiology.  Suspect sx may be related to her HTN.   Head CT added on.  No acute findings.  Stable for dc, close outpt follow up Final Clinical Impression(s) / ED Diagnoses Final diagnoses:  Chest pain, unspecified type  Nonintractable headache, unspecified chronicity pattern, unspecified headache type    Rx / DC Orders ED  Discharge Orders         Ordered    ondansetron (ZOFRAN ODT) 8 MG disintegrating tablet  Every 8 hours PRN        01/13/21 2309           Dorie Rank, MD 01/13/21 2311

## 2021-01-13 NOTE — ED Triage Notes (Signed)
Patient coming from home. Complaint of chest pain and dizziness since Sunday. Patient with hx of heart attack in the past. Stats pain comes and goes. VSS. NAD.

## 2021-01-13 NOTE — Discharge Instructions (Addendum)
Take the medications as needed for nausea.  Make sure to take your blood pressure medications this evening.  Follow-up with your cardiologist and your primary care doctor

## 2021-01-13 NOTE — Consult Note (Signed)
Cardiology Consult    Patient ID: Jocelyn [name] MRN: [id number], DOB/AGE: 1956-01-27   Admit date: 01/13/2021 Date of Consult: 01/13/2021  Primary Physician: Patient, No Pcp Per Primary Cardiologist: Larae Grooms, MD Requesting Provider: Marye Round MD  Patient Profile    Jocelyn [name] is a 65 y.o. female with a history of CAD, hypertension, fibromyalgia, irritable bowel syndrome, diabetes who presents whom we are asked to evaluate for chest pain.  History of Present Illness    65 year old female with history as above who reports that she started having a headache and lightheadedness on Sunday.  The next day she developed significant nausea with persistent headache and lightheadedness.  In the context of this she noticed some blurry vision and difficulty reading.  She checked her blood pressure around that time which was in the range of 140/90.  Following the unwell feeling on Sunday she had an episode of vomiting associated with the nausea that was unusual for her.  Associated with these symptoms she's had some mild episodic chest pain and shortness of breath.  She also had one episode of diarrhea.  She was sent to the ED for evaluation after calling to check in with Dr. Hassell Done office.  She denies loss of consciousness, cough, bloody sputum, bloody diarrhea, bloody vomitus.  Abdominal pain has largely resolved outside of the nausea.  No swelling of her legs.  No dysuria.  ECG in the ED NSR with prior inferior infarct, unchanged from prior.  Troponin 6 and adynamic.  Past Medical History   Past Medical History:  Diagnosis Date  . Acute bronchitis   . Allergic rhinitis, cause unspecified   . Anemia   . Anxiety   . B12 deficiency   . BV (bacterial vaginosis) 06/22/1996  . Calculus of kidney   . Calculus of kidney   . Coronary atherosclerosis of unspecified type of vessel, native or graft   . Dizziness   . Essential hypertension, benign   . Fibroid 2003  . Fibromyalgia    . H/O dysmenorrhea 2008  . H/O varicella   . Headache(784.0)    frequently  . HSV-2 infection 2009  . Hyperplastic colon polyp 05/16/2014  . Irritable bowel syndrome   . Meniere's disease, unspecified   . Menses, irregular 2003  . Myalgia and myositis, unspecified   . Myocardial infarction ([place])   . Obstructive sleep apnea (adult) (pediatric)   . Perimenopausal symptoms 2003  . Pure hypercholesterolemia   . Sarcoidosis   . Type II or unspecified type diabetes mellitus without mention of complication, not stated as uncontrolled   . Unspecified venous (peripheral) insufficiency   . Vitamin D deficiency   . Vulvitis 2010  . Yeast infection     Past Surgical History:  Procedure Laterality Date  . COLONOSCOPY    . CORONARY STENT INTERVENTION N/A 06/15/2017   Procedure: CORONARY STENT INTERVENTION;  Surgeon: Jettie Booze, MD;  Location: [facility];  Service: Cardiovascular;  Laterality: N/A;  . heart catherization    . LEFT HEART CATH AND CORONARY ANGIOGRAPHY N/A 06/15/2017   Procedure: LEFT HEART CATH AND CORONARY ANGIOGRAPHY;  Surgeon: Jettie Booze, MD;  Location: [facility];  Service: Cardiovascular;  Laterality: N/A;  . TONSILLECTOMY       Allergies  Allergen Reactions  . Actos [Pioglitazone] Other (See Comments)    REACTION: pt states INTOL w/ edema  . Codeine Other (See Comments)    REACTION: vomiting  .  Januvia [Sitagliptin] Nausea Only  . Lactose Intolerance (Gi) Diarrhea  . Morphine Nausea Only and Nausea And Vomiting    REACTION: vomiting REACTION: vomiting   Inpatient Medications      Family History    Family History  Adopted: Yes  Problem Relation Age of Onset  . Other Other        ADOPTED  . Colon cancer Neg Hx   . Esophageal cancer Neg Hx   . Rectal cancer Neg Hx   . Stomach cancer Neg Hx    is adopted.    Social History    Social History   Socioeconomic History  . Marital status: Single    Spouse name: Not on  file  . Number of children: 1  . Years of education: Not on file  . Highest education level: Not on file  Occupational History  . Occupation: Retired Pharmacist, hospital   Tobacco Use  . Smoking status: Never Smoker  . Smokeless tobacco: Never Used  . Tobacco comment: Daily Caffeine - 1  Exercise 2-3 times/weekly  Vaping Use  . Vaping Use: Never used  Substance and Sexual Activity  . Alcohol use: No    Alcohol/week: 0.0 standard drinks  . Drug use: No  . Sexual activity: Yes    Birth control/protection: None  Other Topics Concern  . Not on file  Social History Narrative   Exercises 2-3 times weekly   Caffeine Use: 1 daily (tea or Pepsi)   Lives alone in a one story home.  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     Social Determinants of Health   Financial Resource Strain: Not on file  Food Insecurity: Not on file  Transportation Needs: Not on file  Physical Activity: Not on file  Stress: Not on file  Social Connections: Not on file  Intimate Partner Violence: Not on file     Review of Systems   positive bolded General:  No chills, fever, night sweats or weight changes.  Cardiovascular: Chest pain, shortness of breath Dermatological: No rash, lesions/masses Respiratory: No cough, dyspnea Urologic: No hematuria, dysuria Abdominal:   Nausea, vomiting Neurologic:  Headaches, visual changes All other systems reviewed and are otherwise negative except as noted above.  Physical Exam    Blood pressure (!) 166/111, pulse 73, temperature 98.1 F (36.7 C), temperature source Oral, resp. rate 18, height 5\' 2"  (1.575 m), weight 70.3 kg, SpO2 97 %.    No intake or output data in the 24 hours ending 01/13/21 2354 Wt Readings from Last 3 Encounters:  01/13/21 70.3 kg  10/13/20 70 kg  09/03/20 71.6 kg    CONSTITUTIONAL: alert and conversant, well-appearing, nourished, no distress HEENT: oropharynx clear and moist, no mucosal lesions, normal dentition, conjunctiva normal, EOM intact,  pupils equal, no lid lag. NECK: supple, no cervical adenopathy, no thyromegaly CARDIOVASCULAR: Regular rhythm. No gallop, murmur, or rub. Normal S1/S2. Radial pulses intact. JVP flat. No carotid bruits. PULMONARY/CHEST WALL: no deformities, normal breath sounds bilaterally, normal work of breathing ABDOMINAL: soft, non-tender, non-distended EXTREMITIES: no edema or muscle atrophy, warm and well-perfused NEUROLOGIC: alert, normal gait, no abnormal movements, cranial nerves grossly intact.   Labs    Troponin (Point of Care Test) No results for input(s): TROPIPOC in the last 72 hours. No results for input(s): CKTOTAL, CKMB, TROPONINI in the last 72 hours. Lab Results  Component Value Date   WBC 4.3 01/13/2021   HGB 12.9 01/13/2021   HCT 39.2 01/13/2021   MCV 94.9 01/13/2021  PLT 327 01/13/2021    Recent Labs  Lab 01/13/21 1658 01/13/21 1824  NA 140  --   K 4.3  --   CL 105  --   CO2 27  --   BUN 15  --   CREATININE 0.87  --   CALCIUM 9.5  --   PROT  --  6.8  BILITOT  --  0.7  ALKPHOS  --  58  ALT  --  13  AST  --  22  GLUCOSE 116*  --    Lab Results  Component Value Date   CHOL 149 08/22/2020   HDL 37 (L) 08/22/2020   LDLCALC 91 08/22/2020   TRIG 113 08/22/2020   No results found for: [facility]   Radiology Studies    DG Chest 2 View  Result Date: 01/13/2021 CLINICAL DATA:  Chest pain and dizziness. EXAM: CHEST - 2 VIEW COMPARISON:  October 23, 2018 FINDINGS: The cardiac silhouette is mildly enlarged. Both lungs are clear. Radiopaque surgical clips are seen within the right upper quadrant. The visualized skeletal structures are unremarkable. IMPRESSION: No active cardiopulmonary disease. Electronically Signed   By: Virgina Norfolk M.D.   On: 01/13/2021 17:28   CT Head Wo Contrast  Result Date: 01/13/2021 CLINICAL DATA:  Chest pain and dizziness. EXAM: CT HEAD WITHOUT CONTRAST TECHNIQUE: Contiguous axial images were obtained from the base of the skull through the  vertex without intravenous contrast. COMPARISON:  May 31, 2018 FINDINGS: Brain: No evidence of acute infarction, hemorrhage, hydrocephalus, extra-axial collection or mass lesion/mass effect. A stable 6 mm calcified meningioma is seen along the left vertex. Vascular: No hyperdense vessels are identified. Skull: Normal. Negative for fracture or focal lesion. Sinuses/Orbits: No acute finding. Other: None. IMPRESSION: 1. No acute intracranial abnormality. 2. Stable calcified meningioma along the left vertex. Electronically Signed   By: Virgina Norfolk M.D.   On: 01/13/2021 22:38    ECG & Cardiac Imaging   Nuclear perfusion rest and stress 2/14  Nuclear stress EF: 64%.  Blood pressure demonstrated a normal response to exercise.  There was no ST segment deviation noted during stress.  Defect 1: There is a small defect of mild severity present in the apical inferior and apex location.  Findings consistent with prior myocardial infarction.  The left ventricular ejection fraction is normal (55-65%).  This is an intermediate risk study.  2/18 TTE - Left ventricle: The cavity size was normal. There was mild    concentric hypertrophy. Systolic function was normal. The    estimated ejection fraction was in the range of 50% to 55%. Wall    motion was normal; there were no regional wall motion    abnormalities. Doppler parameters are consistent with abnormal    left ventricular relaxation (grade 1 diastolic dysfunction).  - Aortic valve: There was no regurgitation.  - Mitral valve: There was mild regurgitation.  - Right ventricle: The cavity size was normal. Wall thickness was    normal. Systolic function was normal.  - Atrial septum: No defect or patent foramen ovale was identified    by color flow Doppler.  - Tricuspid valve: There was no regurgitation.   LHC 8/18  Mid LAD lesion, 40 %stenosed.  Ost 2nd Diag to 2nd Diag lesion, 80 %stenosed. This appears to be old based on prior cath  report.  Dist RCA-1 lesion, 20 %stenosed.  Mid RCA lesion, 10 %stenosed.  Dist RCA-2 lesion, 100 %stenosed. This is the culprit lesion and was treated with  aspiration thrombectomy and stent placement.  A STENT PROMUS PREM MR [id number] drug eluting stent was successfully placed, postdilated to 3.8 mm.  Post intervention, there is a 0% residual stenosis.  LV end diastolic pressure is normal.  There is no aortic valve stenosis.  Severe radial spasm and subclavian tortuosity. Would consider femoral approach if angiogram was needed in the future.    Assessment & Plan    AMARANTA MEHL is a 65 y.o. female with a history of CAD, hypertension, fibromyalgia, irritable bowel syndrome, diabetes who presents whom we are asked to evaluate for chest pain.  Troponin is not elevated and adynamic and she's had mild chest pain inconsistent with ACS.  She has not had anginal symptoms antecedent this episode.  Given hypertension, nausea, vomiting, headache, and blurry vision recommend CT of the head to screen for acute intracranial process but this was negative and symptoms have been improving.  This could also represent migraine disorder or mild gastroenteritis.  Would not make any changes to medical management at this time.  In the event that she has occult hypertension at home causing these symptoms I've asked her to carefully monitor her blood pressure and report back if its elevated so we can adjust antihypertensives.  Coronary artery disease with history of prior MI - Cotninue praluent - Continue vascepa and bempedoic acid with underlying hypertriglyceridemia - Cotninue plavix monotherapy  Hypertension  - Continue diltiazem 240 mg xr - Continue losartan 100 mg daily - Continue spironolactone 25 mg daily  Signed, Delight Hoh, MD 01/13/2021, 11:54 PM  For questions or updates, please contact   Please consult [email or web address] for contact info under Cardiology/STEMI.

## 2021-01-14 ENCOUNTER — Ambulatory Visit: Payer: Medicare PPO | Admitting: Skilled Nursing Facility1

## 2021-01-21 DIAGNOSIS — Z1231 Encounter for screening mammogram for malignant neoplasm of breast: Secondary | ICD-10-CM | POA: Diagnosis not present

## 2021-02-11 ENCOUNTER — Encounter: Payer: Self-pay | Admitting: Interventional Cardiology

## 2021-02-11 ENCOUNTER — Ambulatory Visit: Payer: Medicare PPO | Admitting: Interventional Cardiology

## 2021-02-11 ENCOUNTER — Other Ambulatory Visit: Payer: Self-pay

## 2021-02-11 VITALS — BP 124/74 | HR 66 | Ht 62.0 in | Wt 159.0 lb

## 2021-02-11 DIAGNOSIS — I252 Old myocardial infarction: Secondary | ICD-10-CM | POA: Diagnosis not present

## 2021-02-11 DIAGNOSIS — E782 Mixed hyperlipidemia: Secondary | ICD-10-CM

## 2021-02-11 DIAGNOSIS — I25118 Atherosclerotic heart disease of native coronary artery with other forms of angina pectoris: Secondary | ICD-10-CM

## 2021-02-11 DIAGNOSIS — E1159 Type 2 diabetes mellitus with other circulatory complications: Secondary | ICD-10-CM

## 2021-02-11 DIAGNOSIS — I10 Essential (primary) hypertension: Secondary | ICD-10-CM

## 2021-02-11 NOTE — Progress Notes (Signed)
Cardiology Office Note   Date:  02/11/2021   ID:  Jocelyn [name], DOB July 16, 1956, MRN [id number]  PCP:  Patient, No Pcp Per (Inactive)    No chief complaint on file.  CAD/Old MI  Wt Readings from Last 3 Encounters:  02/11/21 159 lb (72.1 kg)  01/13/21 155 lb (70.3 kg)  10/13/20 154 lb 4.8 oz (70 kg)       History of Present Illness: Jocelyn [name] is a 65 y.o. female  With CAD. Cath manyyears ago Showed branch vessel disease. She has had difficulty tolerating statins. Eventually, she was put on Repatha.  She had an inferior STEMI on 06/15/2017. She received a drug-eluting stent to her distal right coronary artery. She had mild LAD disease. Her diagonal vessel disease has persisted over the years. Of note, she had severe radial spasm and it was noted that she had subclavian artery lusoria variant which made right radial approach quite difficult. This anatomic abnormality had been noted on a prior CT scan done at [facility].She had arm pain, heartburn, back pain when she had her MI.   Echo in 8/18 showed: Left ventricle: The cavity size was normal. There was mild concentric hypertrophy. Systolic function was normal. The estimated ejection fraction was in the range of 50% to 55%. Wall motion was normal; there were no regional wall motion abnormalities. Doppler parameters are consistent with abnormal left ventricular relaxation (grade 1 diastolic dysfunction). - Aortic valve: There was no regurgitation. - Mitral valve: There was mild regurgitation. - Right ventricle: The cavity size was normal. Wall thickness was normal. Systolic function was normal. - Atrial septum: No defect or patent foramen ovale was identified by color flow Doppler. - Tricuspid valve: There was no regurgitation.  She has had issues with HTN and headaches.THis seems to have resolved.  She had a pneumonia and pleural effusion in 10/18. It was bloody. Her Brilinta was stopped  at that time for thoracentesis.  Knee pain limits exercise.No prolonged walking due to knee.  Working at [employer].  Walks inside [employer].  In 2020, She wore a BP monitor 24 hours, avg BP 138/79.   She has not had COVID.  Denies :  Dizziness. Leg edema. Nitroglycerin use. Orthopnea. Palpitations. Paroxysmal nocturnal dyspnea. Shortness of breath. Syncope.   She had an episode a month ago with some chest tightness and vomiting.  Went to ER: " Suspect sx may be related to her HTN.   Head CT added on.  No acute findings.  Stable for dc, close outpt follow up".   She has some DOE with walking steps.      Past Medical History:  Diagnosis Date  . Acute bronchitis   . Allergic rhinitis, cause unspecified   . Anemia   . Anxiety   . B12 deficiency   . BV (bacterial vaginosis) 06/22/1996  . Calculus of kidney   . Calculus of kidney   . Coronary atherosclerosis of unspecified type of vessel, native or graft   . Dizziness   . Essential hypertension, benign   . Fibroid 2003  . Fibromyalgia   . H/O dysmenorrhea 2008  . H/O varicella   . Headache(784.0)    frequently  . HSV-2 infection 2009  . Hyperplastic colon polyp 05/16/2014  . Irritable bowel syndrome   . Meniere's disease, unspecified   . Menses, irregular 2003  . Myalgia and myositis, unspecified   . Myocardial infarction ([place])   . Obstructive sleep apnea (adult) (  pediatric)   . Perimenopausal symptoms 2003  . Pure hypercholesterolemia   . Sarcoidosis   . Type II or unspecified type diabetes mellitus without mention of complication, not stated as uncontrolled   . Unspecified venous (peripheral) insufficiency   . Vitamin D deficiency   . Vulvitis 2010  . Yeast infection     Past Surgical History:  Procedure Laterality Date  . COLONOSCOPY    . CORONARY STENT INTERVENTION N/A 06/15/2017   Procedure: CORONARY STENT INTERVENTION;  Surgeon: Jettie Booze, MD;  Location: [facility];  Service:  Cardiovascular;  Laterality: N/A;  . heart catherization    . LEFT HEART CATH AND CORONARY ANGIOGRAPHY N/A 06/15/2017   Procedure: LEFT HEART CATH AND CORONARY ANGIOGRAPHY;  Surgeon: Jettie Booze, MD;  Location: [facility];  Service: Cardiovascular;  Laterality: N/A;  . TONSILLECTOMY       Current Outpatient Medications  Medication Sig Dispense Refill  . Alirocumab (PRALUENT) 150 MG/ML SOAJ Inject 1 pen into the skin every 14 (fourteen) days. 2 pen 11  . AMBULATORY NON FORMULARY MEDICATION .Nitroglycerine ointment 0.125 %  Apply a pea sized amount internally four times daily. Dispense 30 GM zero refill x 8 weeks 30 g 1  . azaTHIOprine (IMURAN) 50 MG tablet TAKE TWO TABLETS IN THE MORNING BY MOUTH AND ONE TABLET AT NIGHT AS DIRECTED    . baclofen (LIORESAL) 10 MG tablet Take 0.5-1 tablets (5-10 mg total) by mouth 3 (three) times daily as needed for muscle spasms. 30 each 3  . Bempedoic Acid (NEXLETOL) 180 MG TABS Take 1 tablet by mouth daily. 90 tablet 2  . clopidogrel (PLAVIX) 75 MG tablet Take 1 tablet (75 mg total) by mouth daily. 90 tablet 3  . diltiazem (CARDIZEM CD) 240 MG 24 hr capsule Take 1 capsule (240 mg total) by mouth daily. 90 capsule 2  . Empagliflozin-metFORMIN HCl ER 25-1000 MG TB24 Take 1 tablet by mouth daily.    [name] icosapent Ethyl (VASCEPA) 1 g capsule Take 2 capsules (2 g total) by mouth 2 (two) times daily. 120 capsule 10  . losartan (COZAAR) 100 MG tablet Take 1 tablet (100 mg total) by mouth daily. 90 tablet 3  . metoprolol tartrate (LOPRESSOR) 50 MG tablet Take 1 tablet (50 mg total) by mouth 2 (two) times daily. 180 tablet 3  . nitroGLYCERIN (NITROSTAT) 0.4 MG SL tablet Place 1 tablet (0.4 mg total) under the tongue every 5 (five) minutes as needed for chest pain. 25 tablet 2  . ondansetron (ZOFRAN ODT) 8 MG disintegrating tablet Take 1 tablet (8 mg total) by mouth every 8 (eight) hours as needed for nausea or vomiting. 20 tablet 0  . ONETOUCH VERIO test  strip CHECK BLOOD SUGAR TID AS DIRECTED    . pantoprazole (PROTONIX) 40 MG tablet Take 1 tablet (40 mg total) by mouth daily. 90 tablet 3  . spironolactone (ALDACTONE) 25 MG tablet TAKE 1 TABLET(25 MG) BY MOUTH DAILY 90 tablet 3   No current facility-administered medications for this visit.    Allergies:   Actos [pioglitazone], Codeine, Januvia [sitagliptin], Lactose intolerance (gi), and Morphine    Social History:  The patient  reports that she has never smoked. She has never used smokeless tobacco. She reports that she does not drink alcohol and does not use drugs.   Family History:  The patient's \ family history includes Other in an other family member. She was adopted.    ROS:  Please see  the history of present illness.   Otherwise, review of systems are positive for mild DOE.   All other systems are reviewed and negative.    PHYSICAL EXAM: VS:  BP 124/74   Pulse 66   Ht 5\' 2"  (1.575 m)   Wt 159 lb (72.1 kg)   SpO2 98%   BMI 29.08 kg/m  , BMI Body mass index is 29.08 kg/m. GEN: Well nourished, well developed, in no acute distress  HEENT: normal  Neck: no JVD, carotid bruits, or masses Cardiac: RRR; no murmurs, rubs, or gallops,no edema  Respiratory:  clear to auscultation bilaterally, normal work of breathing GI: soft, nontender, nondistended, + BS MS: no deformity or atrophy  Skin: warm and dry, no rash Neuro:  Strength and sensation are intact Psych: euthymic mood, full affect   EKG:   The ekg ordered 01/13/21 demonstrates NSR, inferolateral ST changes   Recent Labs: 01/13/2021: ALT 13; BUN 15; Creatinine, Ser 0.87; Hemoglobin 12.9; Platelets 327; Potassium 4.3; Sodium 140   Lipid Panel    Component Value Date/Time   CHOL 149 08/22/2020 1609   TRIG 113 08/22/2020 1609   HDL 37 (L) 08/22/2020 1609   CHOLHDL 4.0 08/22/2020 1609   CHOLHDL 4 05/30/2018 1444   VLDL 36.2 05/30/2018 1444   LDLCALC 91 08/22/2020 1609   LDLDIRECT 91.6 08/14/2014 0822     Other  studies Reviewed: Additional studies/ records that were reviewed today with results demonstrating: labs reviewed.   ASSESSMENT AND PLAN:  1. CAD/Old MI: No angina.  Unclear what her episode from early March was caused by.  Negative cardiac work-up in the ER.  No further symptoms.  Continue to monitor.  Continue aggressive secondary prevention.  Exercise target noted below.  She will be more active as the weather improves. 2. Hyperlipidemia: The current medical regimen is effective;  continue present plan and medications.  On Praluent and Nexlizet. 3. DM: A1C 6.3 in 10/21.  High-fiber, healthy diet. 4. HTN: The current medical regimen is effective;  continue present plan and medications. 5. Not on Anxiety meds since Dr. [name] retired.       Current medicines are reviewed at length with the patient today.  The patient concerns regarding her medicines were addressed.  The following changes have been made:  No change  Labs/ tests ordered today include:  No orders of the defined types were placed in this encounter.   Recommend 150 minutes/week of aerobic exercise Low fat, low carb, high fiber diet recommended  Disposition:   FU in 6 months   Signed, Larae Grooms, MD  02/11/2021 4:30 PM    [facility] [address], [place], [place]  [address] Phone: [phone]; Fax: [phone]

## 2021-02-11 NOTE — Patient Instructions (Signed)
Medication Instructions:  Your physician recommends that you continue on your current medications as directed. Please refer to the Current Medication list given to you today.  *If you need a refill on your cardiac medications before your next appointment, please call your pharmacy*   Lab Work: none If you have labs (blood work) drawn today and your tests are completely normal, you will receive your results only by: MyChart Message (if you have MyChart) OR A paper copy in the mail If you have any lab test that is abnormal or we need to change your treatment, we will call you to review the results.   Testing/Procedures: none   Follow-Up: At [facility], you and your health needs are our priority.  As part of our continuing mission to provide you with exceptional heart care, we have created designated Provider Care Teams.  These Care Teams include your primary Cardiologist (physician) and Advanced Practice Providers (APPs -  Physician Assistants and Nurse Practitioners) who all work together to provide you with the care you need, when you need it.  We recommend signing up for the patient portal called "MyChart".  Sign up information is provided on this After Visit Summary.  MyChart is used to connect with patients for Virtual Visits (Telemedicine).  Patients are able to view lab/test results, encounter notes, upcoming appointments, etc.  Non-urgent messages can be sent to your provider as well.   To learn more about what you can do with MyChart, go to [email or web address].    Your next appointment:   6 month(s)  The format for your next appointment:   In Person  Provider:   You may see Jayadeep Varanasi, MD or one of the following Advanced Practice Providers on your designated Care Team:   Dayna Dunn, PA-C Michele Lenze, PA-C   Other Instructions   

## 2021-02-28 ENCOUNTER — Other Ambulatory Visit: Payer: Self-pay | Admitting: Interventional Cardiology

## 2021-03-02 ENCOUNTER — Other Ambulatory Visit: Payer: Self-pay | Admitting: Interventional Cardiology

## 2021-03-02 MED ORDER — ICOSAPENT ETHYL 1 G PO CAPS: 2.0000 g | ORAL_CAPSULE | Freq: Two times a day (BID) | ORAL | 3 refills | Status: DC

## 2021-03-30 ENCOUNTER — Other Ambulatory Visit: Payer: Self-pay | Admitting: Interventional Cardiology

## 2021-03-30 ENCOUNTER — Encounter: Payer: Self-pay | Admitting: Gastroenterology

## 2021-04-17 ENCOUNTER — Telehealth: Payer: Self-pay | Admitting: Pulmonary Disease

## 2021-04-17 MED ORDER — NIRMATRELVIR/RITONAVIR (PAXLOVID)TABLET: 3.0000 | ORAL_TABLET | Freq: Two times a day (BID) | ORAL | 0 refills | Status: AC

## 2021-04-17 NOTE — Telephone Encounter (Signed)
Primary Pulmonologist: [facility] office visit and with whom: 06/12/21 Loanne Drilling What do we see them for (pulmonary problems): OSA, chronic bronchitis Last OV assessment/plan:  Assessment & Plan:   Discussion: 65 year old female who presents for follow-up for shortness of breath, excessive daytime sleepiness and fatigue. Concerned that deconditioning may be contributing to her symptoms. Will evaluate for uncontrolled sleep apnea.  Chronic Bronchitis Recommend trial off Advair. If tolerated, ok to hold Discussed COVID precautions including limiting exposures, social distancing and proper handwashing.  Mild OSA Sleep study in 2006. Epworth score 9 -Home sleep study ordered however insurance denied. Offer in-lab study for evaluation   Anxiety Refilled xanax. Discussed risk of oversedation  Follow-up in 3 months with Dr. Loanne Drilling  for flu vaccine and check-up  Hx sarcoid Diagnosed on bronchoscopy in 1980s without recurrence -No evidence of active disease  Health Maintenance Pneumovax 2006 Influenza 2019 Next visit. Administer Prevnar followed by Pneumovax        Orders Placed This Encounter  Procedures  . Home sleep test    Standing Status:   Future    Standing Expiration Date:   06/12/2020    Scheduling Instructions:     Sooner than later please    Order Specific Question:   Where should this test be performed:    Answer:   [facility] - Pulmonary       Meds ordered this encounter  Medications  . ALPRAZolam (XANAX) 0.5 MG tablet    Sig: TAKE 1/2 TABLET IN THE MORNING AND AFTERNOON, AND 1 TABLET AT BEDTIME    Dispense:  60 tablet    Refill:  1  . ondansetron (ZOFRAN-ODT) 4 MG disintegrating tablet    Sig: Take 1 tablet (4 mg total) by mouth every 8 (eight) hours as needed for nausea or vomiting.    Dispense:  20 tablet    Refill:  1    Return in about 3 months (around 09/13/2019).    Chi Rodman Pickle, MD [facility] 06/20/2019 4:44 PM  Personal pager: [phone] If unanswered, please page CCM On-call: [phone]        Patient Instructions by Margaretha Seeds, MD at 06/13/2019 11:00 AM  Author: Margaretha Seeds, MD Author Type: Physician Filed: 06/13/2019 12:06 PM  Note Status: Addendum Cosign: Cosign Not Required Encounter Date: 06/13/2019  Editor: Amado Coe, RN (Registered Nurse)      Prior Versions: 1. Margaretha Seeds, MD (Physician) at 06/13/2019 12:05 PM - Signed  Chronic Bronchitis Since you are doing better, try to stop Advair inhaler for 2 days. If you are doing ok, you can continue holding it.  Mild OSA We will order home sleep study  Anxiety Refill xanax and zofran  Follow-up in 3 months with Dr. Loanne Drilling  for flu vaccine and check-up        Instructions     Return in about 3 months (around 09/13/2019).  Chronic Bronchitis Since you are doing better, try to stop Advair inhaler for 2 days. If you are doing ok, you can continue holding it.  Mild OSA We will order home sleep study  Anxiety Refill xanax and zofran  Follow-up in 3 months with Dr. Loanne Drilling  for flu vaccine and check-up       Reason for call: Patient states she has had diarrhea for 5-7 days, she thought at first she got some bad food.  Two to three days ago, she began to have nonstop diarrhea, a tight/congested chest,  HA, chill/sweats and a dry cough.  She took several covid tests and they all came back positive.  Dr. Halford Chessman, please advise.  Thank you.     (examples of things to ask: : When did symptoms start? Fever? Cough? Productive? Color to sputum? More sputum than usual? Wheezing? Have you needed increased oxygen? Are you taking your respiratory medications? What over the counter measures have you tried?)    Immunization History  Administered Date(s) Administered  . Influenza Split 08/16/2011, 08/16/2012, 08/16/2013, 09/04/2014  . Influenza Whole 09/16/2009,  08/15/2010  . Influenza,inj,Quad PF,6+ Mos 08/30/2016, 06/16/2017, 10/23/2018, 09/14/2019  . Influenza-Unspecified 08/19/2015  . PFIZER(Purple Top)SARS-COV-2 Vaccination 01/17/2020, 02/13/2020  . Pneumococcal Polysaccharide-23 11/15/2004  . Tdap 02/22/2011

## 2021-04-17 NOTE — Telephone Encounter (Signed)
BMET from 01/13/21 showed creatinine 0.87, GFR > 60.  LFT's normal.   Please send script for paxlovid.    Please schedule tele-visit with Dr. Loanne Drilling or NP next week.

## 2021-04-17 NOTE — Telephone Encounter (Signed)
Left message for patient to call back  

## 2021-04-17 NOTE — Telephone Encounter (Signed)
Spoke with pt and notified of response per Dr Halford Chessman  Rx sent to pharm and televisit with BW for next wk at 04/22/21

## 2021-04-22 ENCOUNTER — Ambulatory Visit (INDEPENDENT_AMBULATORY_CARE_PROVIDER_SITE_OTHER): Payer: Medicare PPO | Admitting: Primary Care

## 2021-04-22 ENCOUNTER — Encounter: Payer: Self-pay | Admitting: Primary Care

## 2021-04-22 ENCOUNTER — Other Ambulatory Visit: Payer: Self-pay

## 2021-04-22 DIAGNOSIS — Z20822 Contact with and (suspected) exposure to covid-19: Secondary | ICD-10-CM | POA: Diagnosis not present

## 2021-04-22 DIAGNOSIS — R001 Bradycardia, unspecified: Secondary | ICD-10-CM | POA: Diagnosis not present

## 2021-04-22 DIAGNOSIS — U071 COVID-19: Secondary | ICD-10-CM | POA: Diagnosis not present

## 2021-04-22 DIAGNOSIS — R197 Diarrhea, unspecified: Secondary | ICD-10-CM | POA: Diagnosis not present

## 2021-04-22 DIAGNOSIS — R110 Nausea: Secondary | ICD-10-CM | POA: Diagnosis not present

## 2021-04-22 MED ORDER — ONDANSETRON HCL 4 MG PO TABS: 4.0000 mg | ORAL_TABLET | Freq: Three times a day (TID) | ORAL | 0 refills | Status: DC | PRN

## 2021-04-22 NOTE — Patient Instructions (Addendum)
-   Recommend BRAT diet, encourage oral fluids and prn imodium - Sending in RX for Zofran 4mg  q 8 hours for nausea  - She can return to work next Monday 6/13 - Recommend patient cut metoprolol in half to 25mg  twice daily and contact cardiology  - Follow-up as needed

## 2021-04-22 NOTE — Progress Notes (Signed)
Virtual Visit via Telephone Note  I connected with Jocelyn [name] on 04/22/21 at  3:30 PM EDT by telephone and verified that I am speaking with the correct person using two identifiers.  Location: Patient: Home Provider: Office    I discussed the limitations, risks, security and privacy concerns of performing an evaluation and management service by telephone and the availability of in person appointments. I also discussed with the patient that there may be a patient responsible charge related to this service. The patient expressed understanding and agreed to proceed.   History of Present Illness: 65 year old female, never smoked. PMH significant for HTN, CAD, mild OSA, asthmatic bronchitis, allergic rhinitis, GERD, IBS, type 2 DM. Patient of Dr. Loanne Drilling, last seen on 06/13/19.  Previous [facility] pulmonary encounter: 06/13/19- Dr. Loanne Drilling  Ms. Jocelyn [name] is a 65 year old female with chronic bronchitis, hx of sarcoid diagnosed via bronchoscopy in 1980s with recurrence, OSA, HTN, CAD s/p DES to RCA in 7124, chronic diastolic heart failure and polymyositis previously on methotrexate and anxiety who presents for follow-up of shortness of breath.  [name] patient: Followed for annual episodes of acute bronchitis treated with antibiotics, inhalers and steroids. She has chronic dyspnea on exertion and is sedentary at baseline. Of note, during hospitalization 08/2019 found with left paramediastinal mass. Repeat imaging demonstrated complete resolution after initiating bronchodilator therapy so this abnormality likely represented atelectasis.  Since our last visit in 12/2018, her bronchodilator was changed to Advair. She does not feel any different, no worse or better. She reports her shortness of breath is minimal but states she limits her activity so not sure if she is doing well. Also ordered sleep study however not completed due to pandemic. Has not had to use her albuterol. She continues to report  excessive daytime sleepiness and fatigue. Denies falling asleep when talking to people however will sleep easily if she is left alone and/or laying down. Her sleep hours are irregular and does not have a routine bed ritual. Considering returning to her part time job for limited hours at [employer].   Epworth: 9  04/22/2021- Interim hx  Patient tested positive for covid on 6/3. She had symptoms of chills, HA, chest congested, dry cough and diarrhea started on Thursday. Sent in paxlovid.  No significant respiratory complaints. Cough and sore throat have improved. Very mild dyspnea when she exerts herself. Diarrhea has slowed down some. She is having 4 loose stools a day, previously she was having diarrhea every hour. She is taking imodium as needed. She went to [facility] today for covid testing. She was told her heart rate was 51. She is having no vision changes or lightheadedness.   Observations/Objective:  - HR 59bpm at rest; 70bpm with exertion   Assessment and Plan:  Covid-19 - Tested positive on 6/3, symptoms started day prior. Completed Paxlovid.  - She is feeling some better, cough and diarrhea have improved. No significant respiratory complaints - Recommend BRAT diet, encourage oral fluids and prn imodium - Sending in RX for Zofran 4mg  q 8 hours for nausea  - She can return to work next Monday 6/13  Bradycardia: - HR 51-59. Denies dizziness/lightheadedness - Recommend patient cut metoprolol in half to 25mg  twice daily and contact cardiology   Mild sleep apnea: - She never had HST that was ordered back in 2020 d/t insurance coverage/cost  - Denies overt apnea symptoms   Follow Up Instructions:  - As needed    I discussed  the assessment and treatment plan with the patient. The patient was provided an opportunity to ask questions and all were answered. The patient agreed with the plan and demonstrated an understanding of the instructions.   The patient was advised to call back or  seek an in-person evaluation if the symptoms worsen or if the condition fails to improve as anticipated.  I provided 20 minutes of non-face-to-face time during this encounter.   Martyn Ehrich, NP

## 2021-05-08 ENCOUNTER — Other Ambulatory Visit: Payer: Self-pay | Admitting: Interventional Cardiology

## 2021-05-11 NOTE — Telephone Encounter (Signed)
Rx(s) sent to pharmacy electronically.  

## 2021-05-12 DIAGNOSIS — E1151 Type 2 diabetes mellitus with diabetic peripheral angiopathy without gangrene: Secondary | ICD-10-CM | POA: Diagnosis not present

## 2021-05-12 DIAGNOSIS — I2510 Atherosclerotic heart disease of native coronary artery without angina pectoris: Secondary | ICD-10-CM | POA: Diagnosis not present

## 2021-05-12 DIAGNOSIS — D3502 Benign neoplasm of left adrenal gland: Secondary | ICD-10-CM | POA: Diagnosis not present

## 2021-05-12 DIAGNOSIS — E669 Obesity, unspecified: Secondary | ICD-10-CM | POA: Diagnosis not present

## 2021-05-26 DIAGNOSIS — M25561 Pain in right knee: Secondary | ICD-10-CM | POA: Diagnosis not present

## 2021-05-26 DIAGNOSIS — M5136 Other intervertebral disc degeneration, lumbar region: Secondary | ICD-10-CM | POA: Diagnosis not present

## 2021-05-26 DIAGNOSIS — Z79899 Other long term (current) drug therapy: Secondary | ICD-10-CM | POA: Diagnosis not present

## 2021-05-26 DIAGNOSIS — M3322 Polymyositis with myopathy: Secondary | ICD-10-CM | POA: Diagnosis not present

## 2021-05-26 DIAGNOSIS — E663 Overweight: Secondary | ICD-10-CM | POA: Diagnosis not present

## 2021-05-26 DIAGNOSIS — J948 Other specified pleural conditions: Secondary | ICD-10-CM | POA: Diagnosis not present

## 2021-05-26 DIAGNOSIS — Z6829 Body mass index (BMI) 29.0-29.9, adult: Secondary | ICD-10-CM | POA: Diagnosis not present

## 2021-06-04 ENCOUNTER — Other Ambulatory Visit: Payer: Self-pay

## 2021-06-05 ENCOUNTER — Ambulatory Visit: Payer: Medicare PPO | Admitting: Nurse Practitioner

## 2021-06-12 ENCOUNTER — Encounter: Payer: Medicare PPO | Admitting: Gastroenterology

## 2021-06-12 ENCOUNTER — Other Ambulatory Visit: Payer: Self-pay | Admitting: Gastroenterology

## 2021-06-18 ENCOUNTER — Encounter: Payer: Self-pay | Admitting: Gastroenterology

## 2021-06-23 ENCOUNTER — Telehealth: Payer: Self-pay | Admitting: Interventional Cardiology

## 2021-06-23 NOTE — Telephone Encounter (Signed)
   Pt c/o medication issue:  1. Name of Medication: statin  2. How are you currently taking this medication (dosage and times per day)?   3. Are you having a reaction (difficulty breathing--STAT)?   4. What is your medication issue? Marcene Brawn PharmD with [facility] calling, asking if the pt can be on moderate to high intensity statin, she said they faxed a request to Dr. Irish Lack as well, if Dr. Irish Lack recommends no need statin theraphy to send them an icd - 10 code

## 2021-06-24 NOTE — Telephone Encounter (Signed)
I spoke with [place], PharmD and let her know patient is statin intolerant and taking Praluent, Nexletol and Vascepa

## 2021-07-03 DIAGNOSIS — I10 Essential (primary) hypertension: Secondary | ICD-10-CM | POA: Diagnosis not present

## 2021-07-03 DIAGNOSIS — N3281 Overactive bladder: Secondary | ICD-10-CM | POA: Diagnosis not present

## 2021-07-03 DIAGNOSIS — Z0000 Encounter for general adult medical examination without abnormal findings: Secondary | ICD-10-CM | POA: Diagnosis not present

## 2021-07-03 DIAGNOSIS — E1169 Type 2 diabetes mellitus with other specified complication: Secondary | ICD-10-CM | POA: Diagnosis not present

## 2021-07-24 DIAGNOSIS — M25562 Pain in left knee: Secondary | ICD-10-CM | POA: Diagnosis not present

## 2021-07-24 DIAGNOSIS — M25561 Pain in right knee: Secondary | ICD-10-CM | POA: Diagnosis not present

## 2021-08-09 NOTE — Progress Notes (Signed)
Cardiology Office Note   Date:  08/10/2021   ID:  MAUDELL [name], DOB 06-05-1956, MRN [id number]  PCP:  Patient, No Pcp Per (Inactive)    No chief complaint on file.  CAD  Wt Readings from Last 3 Encounters:  08/10/21 172 lb 12.8 oz (78.4 kg)  02/11/21 159 lb (72.1 kg)  01/13/21 155 lb (70.3 kg)       History of Present Illness: Jocelyn [name] is a 65 y.o. female   With CAD.  Cath many years ago Showed branch vessel disease. She has had difficulty tolerating statins. Eventually, she was put on Repatha.   She had an inferior STEMI on 06/15/2017. She received a drug-eluting stent to her distal right coronary artery. She had mild LAD disease. Her diagonal vessel disease has persisted over the years. Of note, she had severe radial spasm and it was noted that she had subclavian artery lusoria variant which made right radial approach quite difficult. This anatomic abnormality had been noted on a prior CT scan done at [facility].  She had arm pain, heartburn, back pain when she had her MI.     Echo in 8/18 showed: Left ventricle: The cavity size was normal. There was mild   concentric hypertrophy. Systolic function was normal. The   estimated ejection fraction was in the range of 50% to 55%. Wall   motion was normal; there were no regional wall motion   abnormalities. Doppler parameters are consistent with abnormal   left ventricular relaxation (grade 1 diastolic dysfunction). - Aortic valve: There was no regurgitation. - Mitral valve: There was mild regurgitation. - Right ventricle: The cavity size was normal. Wall thickness was   normal. Systolic function was normal. - Atrial septum: No defect or patent foramen ovale was identified   by color flow Doppler. - Tricuspid valve: There was no regurgitation.   She has had issues with HTN and headaches.  THis seems to have resolved.   She had a pneumonia and pleural effusion in 10/18.  It was bloody.  Her Brilinta was stopped at  that time for thoracentesis.   Knee pain limits exercise. No prolonged walking due to knee.  Working at [employer].  Walks inside [employer].   In 2020,  She wore a BP monitor 24 hours, avg BP 138/79.   She had COVID in June.  Sx lasted about three weeks.  She took antiviral.    Denies : exertional Chest pain. Dizziness. Leg edema. Nitroglycerin use. Orthopnea. Palpitations. Paroxysmal nocturnal dyspnea. Shortness of breath. Syncope.     Some chest pain with stress, but different type of sx than MI.  She has DOE. She wants to go back to cardiac rehab.     Past Medical History:  Diagnosis Date   Acute bronchitis    Allergic rhinitis, cause unspecified    Anemia    Anxiety    B12 deficiency    BV (bacterial vaginosis) 06/22/1996   Calculus of kidney    Calculus of kidney    Coronary atherosclerosis of unspecified type of vessel, native or graft    Dizziness    Essential hypertension, benign    Fibroid 2003   Fibromyalgia    H/O dysmenorrhea 2008   H/O varicella    Headache(784.0)    frequently   HSV-2 infection 2009   Hyperplastic colon polyp 05/16/2014   Irritable bowel syndrome    Meniere's disease, unspecified    Menses, irregular 2003  Myalgia and myositis, unspecified    Myocardial infarction ([place])    Obstructive sleep apnea (adult) (pediatric)    Perimenopausal symptoms 2003   Pure hypercholesterolemia    Sarcoidosis    Type II or unspecified type diabetes mellitus without mention of complication, not stated as uncontrolled    Unspecified venous (peripheral) insufficiency    Vitamin D deficiency    Vulvitis 2010   Yeast infection     Past Surgical History:  Procedure Laterality Date   COLONOSCOPY     CORONARY STENT INTERVENTION N/A 06/15/2017   Procedure: CORONARY STENT INTERVENTION;  Surgeon: Jettie Booze, MD;  Location: [facility];  Service: Cardiovascular;  Laterality: N/A;   heart catherization     LEFT HEART CATH AND CORONARY  ANGIOGRAPHY N/A 06/15/2017   Procedure: LEFT HEART CATH AND CORONARY ANGIOGRAPHY;  Surgeon: Jettie Booze, MD;  Location: [facility];  Service: Cardiovascular;  Laterality: N/A;   TONSILLECTOMY       Current Outpatient Medications  Medication Sig Dispense Refill   Alirocumab (PRALUENT) 150 MG/ML SOAJ Inject 1 pen into the skin every 14 (fourteen) days. 2 pen 11   Bempedoic Acid (NEXLETOL) 180 MG TABS Take 1 tablet by mouth daily. 90 tablet 2   clopidogrel (PLAVIX) 75 MG tablet Take 1 tablet (75 mg total) by mouth daily. 90 tablet 3   diltiazem (CARDIZEM CD) 240 MG 24 hr capsule Take 1 capsule (240 mg total) by mouth daily. 90 capsule 2   Empagliflozin-metFORMIN HCl ER 25-1000 MG TB24 Take 1 tablet by mouth daily.     icosapent Ethyl (VASCEPA) 1 g capsule Take 2 capsules (2 g total) by mouth 2 (two) times daily. 360 capsule 3   losartan (COZAAR) 100 MG tablet TAKE 1 TABLET(100 MG) BY MOUTH DAILY 90 tablet 1   methotrexate (RHEUMATREX) 2.5 MG tablet methotrexate sodium 2.5 mg tablet  take 8 tablets by mouth ONCE A WEEK     metoprolol tartrate (LOPRESSOR) 50 MG tablet TAKE 1 TABLET(50 MG) BY MOUTH TWICE DAILY 180 tablet 3   nitroGLYCERIN (NITROSTAT) 0.4 MG SL tablet Place 1 tablet (0.4 mg total) under the tongue every 5 (five) minutes as needed for chest pain. 25 tablet 2   ondansetron (ZOFRAN ODT) 8 MG disintegrating tablet Take 1 tablet (8 mg total) by mouth every 8 (eight) hours as needed for nausea or vomiting. 20 tablet 0   ondansetron (ZOFRAN) 4 MG tablet Take 1 tablet (4 mg total) by mouth every 8 (eight) hours as needed for nausea or vomiting. 20 tablet 0   ONETOUCH VERIO test strip CHECK BLOOD SUGAR TID AS DIRECTED     pantoprazole (PROTONIX) 40 MG tablet Take 1 tablet (40 mg total) by mouth daily. 90 tablet 3   spironolactone (ALDACTONE) 25 MG tablet TAKE 1 TABLET(25 MG) BY MOUTH DAILY 90 tablet 3   No current facility-administered medications for this visit.     Allergies:   Actos [pioglitazone], Codeine, Januvia [sitagliptin], Lactose intolerance (gi), and Morphine    Social History:  The patient  reports that she has never smoked. She has never used smokeless tobacco. She reports that she does not drink alcohol and does not use drugs.   Family History:  The patient's family history includes Other in an other family member. She was adopted.    ROS:  Please see the history of present illness.   Otherwise, review of systems are positive for .   All other systems are  reviewed and negative.    PHYSICAL EXAM: VS:  BP 124/76   Pulse 92   Ht 5\' 2"  (1.575 m)   Wt 172 lb 12.8 oz (78.4 kg)   SpO2 96%   BMI 31.61 kg/m  , BMI Body mass index is 31.61 kg/m. GEN: Well nourished, well developed, in no acute distress HEENT: normal Neck: no JVD, carotid bruits, or masses Cardiac: RRR; no murmurs, rubs, or gallops,no edema  Respiratory:  clear to auscultation bilaterally, normal work of breathing GI: soft, nontender, nondistended, + BS MS: no deformity or atrophy Skin: warm and dry, no rash Neuro:  Strength and sensation are intact Psych: euthymic mood, full affect   Recent Labs: 01/13/2021: ALT 13; BUN 15; Creatinine, Ser 0.87; Hemoglobin 12.9; Platelets 327; Potassium 4.3; Sodium 140   Lipid Panel    Component Value Date/Time   CHOL 149 08/22/2020 1609   TRIG 113 08/22/2020 1609   HDL 37 (L) 08/22/2020 1609   CHOLHDL 4.0 08/22/2020 1609   CHOLHDL 4 05/30/2018 1444   VLDL 36.2 05/30/2018 1444   LDLCALC 91 08/22/2020 1609   LDLDIRECT 91.6 08/14/2014 0822     Other studies Reviewed: Additional studies/ records that were reviewed today with results demonstrating: labs reviewed.     ASSESSMENT AND PLAN:  CAD/Old MI: No angina on medical therapy.  Clopidogrel monotherapy.  OK to stop aspirin.  Needs a colonoscopy.  OK to hold Plavix 5 days prior to colonoscopy.  Hyperlipidemia: LDL 104 in 06/2021.  Followed with lipid clinic.  Still  above target.  Has been on Nexletol as well.  Also on Praluent- but there was a mixup at the pharmacy. Type 2 DM: A1C 6.2 in 06/2021.  Whole food, plant based diet.  HTN: The current medical regimen is effective;  continue present plan and medications.  Now off of spironolactone.  May need to restart at some point based on BP.     Current medicines are reviewed at length with the patient today.  The patient concerns regarding her medicines were addressed.  The following changes have been made:  No change  Labs/ tests ordered today include:  No orders of the defined types were placed in this encounter.   Recommend 150 minutes/week of aerobic exercise Low fat, low carb, high fiber diet recommended  Disposition:   FU in 6 months   Signed, Larae Grooms, MD  08/10/2021 2:49 PM    [facility] [address], [place], [place]  [address] Phone: [phone]; Fax: [phone]

## 2021-08-10 ENCOUNTER — Other Ambulatory Visit: Payer: Self-pay

## 2021-08-10 ENCOUNTER — Encounter: Payer: Self-pay | Admitting: Interventional Cardiology

## 2021-08-10 ENCOUNTER — Ambulatory Visit: Payer: Medicare PPO | Admitting: Interventional Cardiology

## 2021-08-10 VITALS — BP 124/76 | HR 92 | Ht 62.0 in | Wt 172.8 lb

## 2021-08-10 DIAGNOSIS — E1159 Type 2 diabetes mellitus with other circulatory complications: Secondary | ICD-10-CM | POA: Diagnosis not present

## 2021-08-10 DIAGNOSIS — E782 Mixed hyperlipidemia: Secondary | ICD-10-CM

## 2021-08-10 DIAGNOSIS — I25118 Atherosclerotic heart disease of native coronary artery with other forms of angina pectoris: Secondary | ICD-10-CM | POA: Diagnosis not present

## 2021-08-10 DIAGNOSIS — I10 Essential (primary) hypertension: Secondary | ICD-10-CM

## 2021-08-10 DIAGNOSIS — I252 Old myocardial infarction: Secondary | ICD-10-CM | POA: Diagnosis not present

## 2021-08-10 MED ORDER — CLOPIDOGREL BISULFATE 75 MG PO TABS: 75.0000 mg | ORAL_TABLET | Freq: Every day | ORAL | 3 refills | Status: DC

## 2021-08-10 NOTE — Patient Instructions (Addendum)
Medication Instructions:  Your physician has recommended you make the following change in your medication: Stop Aspirin. Resume Clopidogrel 75 mg by mouth daily  *If you need a refill on your cardiac medications before your next appointment, please call your pharmacy*   Lab Work: none If you have labs (blood work) drawn today and your tests are completely normal, you will receive your results only by: [place] (if you have MyChart) OR A paper copy in the mail If you have any lab test that is abnormal or we need to change your treatment, we will call you to review the results.   Testing/Procedures: none   Follow-Up: At [facility], you and your health needs are our priority.  As part of our continuing mission to provide you with exceptional heart care, we have created designated Provider Care Teams.  These Care Teams include your primary Cardiologist (physician) and Advanced Practice Providers (APPs -  Physician Assistants and Nurse Practitioners) who all work together to provide you with the care you need, when you need it.  We recommend signing up for the patient portal called "MyChart".  Sign up information is provided on this After Visit Summary.  MyChart is used to connect with patients for Virtual Visits (Telemedicine).  Patients are able to view lab/test results, encounter notes, upcoming appointments, etc.  Non-urgent messages can be sent to your provider as well.   To learn more about what you can do with MyChart, go to [email or web address].    Your next appointment:   February 08, 2022 at 2:00  The format for your next appointment:   In Person  Provider:   Casandra Doffing, MD   Other Instructions

## 2021-08-11 ENCOUNTER — Telehealth: Payer: Self-pay | Admitting: Pharmacist

## 2021-08-11 MED ORDER — PRALUENT 150 MG/ML ~~LOC~~ SOAJ: 1.0000 "pen " | SUBCUTANEOUS | 3 refills | Status: DC

## 2021-08-11 MED ORDER — NEXLETOL 180 MG PO TABS: 1.0000 | ORAL_TABLET | Freq: Every day | ORAL | 3 refills | Status: DC

## 2021-08-11 MED ORDER — SPIRONOLACTONE 25 MG PO TABS: ORAL_TABLET | ORAL | 3 refills | Status: DC

## 2021-08-11 NOTE — Telephone Encounter (Signed)
Returned a call to pharmacy who stated that the compliance on picking up meds hasn't been good. Will resend rx for praluent and nexletol

## 2021-08-11 NOTE — Telephone Encounter (Signed)
Jocelyn Booze, MD  Zenya Hickam E, RPH-CPP LDL 104 in 06/2021 .  Labs in media tab. She is having an issue getting her praluent.  Has been off of spironolactone for a while due to issues with the pharmacy.

## 2021-08-14 DIAGNOSIS — E559 Vitamin D deficiency, unspecified: Secondary | ICD-10-CM | POA: Diagnosis not present

## 2021-08-14 DIAGNOSIS — Z6831 Body mass index (BMI) 31.0-31.9, adult: Secondary | ICD-10-CM | POA: Diagnosis not present

## 2021-08-14 DIAGNOSIS — F330 Major depressive disorder, recurrent, mild: Secondary | ICD-10-CM | POA: Diagnosis not present

## 2021-08-14 DIAGNOSIS — E1169 Type 2 diabetes mellitus with other specified complication: Secondary | ICD-10-CM | POA: Diagnosis not present

## 2021-08-14 DIAGNOSIS — I10 Essential (primary) hypertension: Secondary | ICD-10-CM | POA: Diagnosis not present

## 2021-08-14 DIAGNOSIS — G4700 Insomnia, unspecified: Secondary | ICD-10-CM | POA: Diagnosis not present

## 2021-08-14 DIAGNOSIS — F064 Anxiety disorder due to known physiological condition: Secondary | ICD-10-CM | POA: Diagnosis not present

## 2021-08-14 DIAGNOSIS — N3281 Overactive bladder: Secondary | ICD-10-CM | POA: Diagnosis not present

## 2021-08-19 NOTE — Progress Notes (Signed)
[place] Phone: [phone] Subjective:   Jocelyn [name], am serving as a scribe for Dr. Hulan Saas. This visit occurred during the SARS-CoV-2 public health emergency.  Safety protocols were in place, including screening questions prior to the visit, additional usage of staff PPE, and extensive cleaning of exam room while observing appropriate contact time as indicated for disinfecting solutions.   I'm seeing this patient by the request  of:  Patient, [name] Pcp Per (Inactive)  CC: Bilateral knee pain  INO:[id number]  Jocelyn [name] is a 65 y.o. female coming in with complaint of  B knee pain, R>L. Pain is constant. Pain occurring over medial aspect of R knee. Patient fell at work August 18th directly on front of knee. Patient has been icing for pain relief. Patient seen at [facility]. Was given knee injection, brace, and meloxicam. Injection only helped patient slightly.   Xray L knee 10/2019 Moderate tricompartmental DJD.  Xray R knee 10/2019 Moderately severe tricompartmental DJD with significant medial compartment  joint space narrowing.      Past Medical History:  Diagnosis Date   Acute bronchitis    Allergic rhinitis, cause unspecified    Anemia    Anxiety    B12 deficiency    BV (bacterial vaginosis) 06/22/1996   Calculus of kidney    Calculus of kidney    Coronary atherosclerosis of unspecified type of vessel, native or graft    Dizziness    Essential hypertension, benign    Fibroid 2003   Fibromyalgia    H/O dysmenorrhea 2008   H/O varicella    Headache(784.0)    frequently   HSV-2 infection 2009   Hyperplastic colon polyp 05/16/2014   Irritable bowel syndrome    Meniere's disease, unspecified    Menses, irregular 2003   Myalgia and myositis, unspecified    Myocardial infarction ([place])    Obstructive sleep apnea (adult) (pediatric)    Perimenopausal symptoms 2003   Pure hypercholesterolemia     Sarcoidosis    Type II or unspecified type diabetes mellitus without mention of complication, not stated as uncontrolled    Unspecified venous (peripheral) insufficiency    Vitamin D deficiency    Vulvitis 2010   Yeast infection    Past Surgical History:  Procedure Laterality Date   COLONOSCOPY     CORONARY STENT INTERVENTION N/A 06/15/2017   Procedure: CORONARY STENT INTERVENTION;  Surgeon: Jettie Booze, MD;  Location: [facility];  Service: Cardiovascular;  Laterality: N/A;   heart catherization     LEFT HEART CATH AND CORONARY ANGIOGRAPHY N/A 06/15/2017   Procedure: LEFT HEART CATH AND CORONARY ANGIOGRAPHY;  Surgeon: Jettie Booze, MD;  Location: [facility];  Service: Cardiovascular;  Laterality: N/A;   TONSILLECTOMY     Social History   Socioeconomic History   Marital status: Single    Spouse name: Not on file   Number of children: 1   Years of education: Not on file   Highest education level: Not on file  Occupational History   Occupation: Retired Pharmacist, hospital   Tobacco Use   Smoking status: Never   Smokeless tobacco: Never   Tobacco comments:    Daily Caffeine - 1  Exercise 2-3 times/weekly  Vaping Use   Vaping Use: Never used  Substance and Sexual Activity   Alcohol use: [name]    Alcohol/week: 0.0 standard drinks   Drug use: [name]   Sexual  activity: Yes    Birth control/protection: None  Other Topics Concern   Not on file  Social History Narrative   Exercises 2-3 times weekly   Caffeine Use: 1 daily (tea or Pepsi)   Lives alone in a one story home.  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     Social Determinants of Health   Financial Resource Strain: Not on file  Food Insecurity: Not on file  Transportation Needs: Not on file  Physical Activity: Not on file  Stress: Not on file  Social Connections: Not on file   Allergies  Allergen Reactions   Actos [Pioglitazone] Other (See Comments)    REACTION: pt states INTOL w/ edema   Codeine  Other (See Comments)    REACTION: vomiting   Januvia [Sitagliptin] Nausea Only   Lactose Intolerance (Gi) Diarrhea   Morphine Nausea Only and Nausea And Vomiting    REACTION: vomiting REACTION: vomiting   Family History  Adopted: Yes  Problem Relation Age of Onset   Other Other        ADOPTED   Colon cancer Neg Hx    Esophageal cancer Neg Hx    Rectal cancer Neg Hx    Stomach cancer Neg Hx     Current Outpatient Medications (Endocrine & Metabolic):    Empagliflozin-metFORMIN HCl ER 25-1000 MG TB24, Take 1 tablet by mouth daily.  Current Outpatient Medications (Cardiovascular):    Alirocumab (PRALUENT) 150 MG/ML SOAJ, Inject 1 pen into the skin every 14 (fourteen) days.   Bempedoic Acid (NEXLETOL) 180 MG TABS, Take 1 tablet by mouth daily.   diltiazem (CARDIZEM CD) 240 MG 24 hr capsule, Take 1 capsule (240 mg total) by mouth daily.   icosapent Ethyl (VASCEPA) 1 g capsule, Take 2 capsules (2 g total) by mouth 2 (two) times daily.   losartan (COZAAR) 100 MG tablet, TAKE 1 TABLET(100 MG) BY MOUTH DAILY   metoprolol tartrate (LOPRESSOR) 50 MG tablet, TAKE 1 TABLET(50 MG) BY MOUTH TWICE DAILY   nitroGLYCERIN (NITROSTAT) 0.4 MG SL tablet, Place 1 tablet (0.4 mg total) under the tongue every 5 (five) minutes as needed for chest pain.   spironolactone (ALDACTONE) 25 MG tablet, TAKE 1 TABLET(25 MG) BY MOUTH DAILY   Current Outpatient Medications (Analgesics):    meloxicam (MOBIC) 7.5 MG tablet, Take 7.5 mg by mouth daily.  Current Outpatient Medications (Hematological):    clopidogrel (PLAVIX) 75 MG tablet, Take 1 tablet (75 mg total) by mouth daily.  Current Outpatient Medications (Other):    methotrexate (RHEUMATREX) 2.5 MG tablet, methotrexate sodium 2.5 mg tablet  take 8 tablets by mouth ONCE A WEEK   ondansetron (ZOFRAN ODT) 8 MG disintegrating tablet, Take 1 tablet (8 mg total) by mouth every 8 (eight) hours as needed for nausea or vomiting.   ondansetron (ZOFRAN) 4 MG tablet,  Take 1 tablet (4 mg total) by mouth every 8 (eight) hours as needed for nausea or vomiting.   ONETOUCH VERIO test strip, CHECK BLOOD SUGAR TID AS DIRECTED   pantoprazole (PROTONIX) 40 MG tablet, Take 1 tablet (40 mg total) by mouth daily.   Reviewed prior external information including notes and imaging from  primary care provider As well as notes that were available from care everywhere and other healthcare systems.  Past medical history, social, surgical and family history all reviewed in electronic medical record.  [name] pertanent information unless stated regarding to the chief complaint.   Review of Systems:  [name] headache, visual changes, nausea, vomiting, diarrhea,  constipation, dizziness, abdominal pain, skin rash, fevers, chills, night sweats, weight loss, swollen lymph nodes, body aches, joint swelling, chest pain, shortness of breath, mood changes. POSITIVE muscle aches  Objective  Blood pressure 102/76, pulse 92, height 5\' 2"  (1.575 m), SpO2 95 %.   General: [name] apparent distress alert and oriented x3 mood and affect normal, dressed appropriately.  HEENT: Pupils equal, extraocular movements intact  Respiratory: Patient's speak in full sentences and does not appear short of breath  Cardiovascular: [name] lower extremity edema, non tender, [name] erythema  Gait significantly antalgic MSK: Patient's knee does have arthritic changes bilaterally.  Patient is tender to palpation on the right knee more than the left knee.  Patient does have discomfort with range of motion.  Patient lacks the last 10 degrees of flexion of the right knee mild instability with valgus and varus force.  ACL appears to be intact  Limited muscular skeletal ultrasound was performed and interpreted by Hulan Saas, M  Limited ultrasound shows the patient does have moderate to severe narrowing of the medial joint line.  Patient does have hyperechoic changes of the patella that is consistent with potential contusion.  Patient  also has some very mild cortical irregularity of the patellofemoral joint but likely secondary to more chronic arthritis than acute fracture.  Patient does have a large effusion noted with mild synovitis of the knee.   Impression and Recommendations:     The above documentation has been reviewed and is accurate and complete Lyndal Pulley, DO

## 2021-08-20 ENCOUNTER — Other Ambulatory Visit: Payer: Self-pay

## 2021-08-20 ENCOUNTER — Encounter: Payer: Self-pay | Admitting: Family Medicine

## 2021-08-20 ENCOUNTER — Ambulatory Visit (INDEPENDENT_AMBULATORY_CARE_PROVIDER_SITE_OTHER): Payer: Medicare PPO

## 2021-08-20 ENCOUNTER — Ambulatory Visit: Payer: Medicare PPO | Admitting: Family Medicine

## 2021-08-20 ENCOUNTER — Ambulatory Visit: Payer: Self-pay

## 2021-08-20 VITALS — BP 102/76 | HR 92 | Ht 62.0 in

## 2021-08-20 DIAGNOSIS — M25562 Pain in left knee: Secondary | ICD-10-CM

## 2021-08-20 DIAGNOSIS — M1711 Unilateral primary osteoarthritis, right knee: Secondary | ICD-10-CM

## 2021-08-20 DIAGNOSIS — G8929 Other chronic pain: Secondary | ICD-10-CM

## 2021-08-20 DIAGNOSIS — M25561 Pain in right knee: Secondary | ICD-10-CM

## 2021-08-20 DIAGNOSIS — M179 Osteoarthritis of knee, unspecified: Secondary | ICD-10-CM | POA: Insufficient documentation

## 2021-08-20 NOTE — Assessment & Plan Note (Addendum)
Degenerative arthritis of the knee noted.  Discussed with patient about icing regimen and home exercises.  Discussed that I do think that the fall may have exacerbated an underlying condition.  Patient has had x-rays showing the patient did have severe arthritis of this knee.  Do not feel any increasing instability but patient does have instability.  We have had patient have a custom brace previously which she does not wear on a regular basis.  We discussed because patient did have an effusion we could consider the possibility of aspiration which patient declined today but we may reconsider if it continues to give her difficulty.  Discussed with formal physical therapy.  Patient is likely though to start a Workmen's Comp. case and may be seen another provider.  Otherwise with patient will follow-up again in 4 weeks.  Patient was given a abbreviated work schedule note today.

## 2021-08-20 NOTE — Patient Instructions (Addendum)
Xray today Check with work about workman's comp See you again in 4 weeks Try Arnica lotion, Icing and do prescribed exercises at least 3x a week

## 2021-08-26 DIAGNOSIS — E669 Obesity, unspecified: Secondary | ICD-10-CM | POA: Diagnosis not present

## 2021-08-26 DIAGNOSIS — M3322 Polymyositis with myopathy: Secondary | ICD-10-CM | POA: Diagnosis not present

## 2021-08-26 DIAGNOSIS — Z6832 Body mass index (BMI) 32.0-32.9, adult: Secondary | ICD-10-CM | POA: Diagnosis not present

## 2021-08-26 DIAGNOSIS — M25561 Pain in right knee: Secondary | ICD-10-CM | POA: Diagnosis not present

## 2021-08-26 DIAGNOSIS — Z79899 Other long term (current) drug therapy: Secondary | ICD-10-CM | POA: Diagnosis not present

## 2021-08-26 DIAGNOSIS — J948 Other specified pleural conditions: Secondary | ICD-10-CM | POA: Diagnosis not present

## 2021-08-26 DIAGNOSIS — M5136 Other intervertebral disc degeneration, lumbar region: Secondary | ICD-10-CM | POA: Diagnosis not present

## 2021-08-27 ENCOUNTER — Other Ambulatory Visit: Payer: Self-pay | Admitting: Interventional Cardiology

## 2021-09-18 ENCOUNTER — Ambulatory Visit: Payer: Medicare PPO | Admitting: Family Medicine

## 2021-09-25 DIAGNOSIS — E782 Mixed hyperlipidemia: Secondary | ICD-10-CM | POA: Diagnosis not present

## 2021-09-25 DIAGNOSIS — E785 Hyperlipidemia, unspecified: Secondary | ICD-10-CM | POA: Diagnosis not present

## 2021-09-25 DIAGNOSIS — I10 Essential (primary) hypertension: Secondary | ICD-10-CM | POA: Diagnosis not present

## 2021-09-25 DIAGNOSIS — E1169 Type 2 diabetes mellitus with other specified complication: Secondary | ICD-10-CM | POA: Diagnosis not present

## 2021-09-25 DIAGNOSIS — M1000 Idiopathic gout, unspecified site: Secondary | ICD-10-CM | POA: Diagnosis not present

## 2021-09-25 DIAGNOSIS — F064 Anxiety disorder due to known physiological condition: Secondary | ICD-10-CM | POA: Diagnosis not present

## 2021-10-03 ENCOUNTER — Other Ambulatory Visit: Payer: Self-pay | Admitting: Interventional Cardiology

## 2021-10-05 NOTE — Progress Notes (Deleted)
[place] Phone: [phone] Subjective:    I'm seeing this patient by the request  of:  Patient, No Pcp Per (Inactive)  CC: Knee pain follow-up  [id number]  08/20/2021 Degenerative arthritis of the knee noted.  Discussed with patient about icing regimen and home exercises.  Discussed that I do think that the fall may have exacerbated an underlying condition.  Patient has had x-rays showing the patient did have severe arthritis of this knee.  Do not feel any increasing instability but patient does have instability.  We have had patient have a custom brace previously which she does not wear on a regular basis.  We discussed because patient did have an effusion we could consider the possibility of aspiration which patient declined today but we may reconsider if it continues to give her difficulty.  Discussed with formal physical therapy.  Patient is likely though to start a Workmen's Comp. case and may be seen another provider.  Otherwise with patient will follow-up again in 4 weeks.  Patient was given a abbreviated work schedule note today.  Updated 10/06/2021 Leeanne Mannan Rowland is a 65 y.o. female coming in with complaint of knee pain.  Was found to have arthritic changes as well as a fairly large synovitis and large effusion.  Patella did have a contusion noted as well.  Patient states   Xray IMPRESSION: 1. No acute fracture or dislocation identified about the right knee. 2. Mild 3 compartment osteoarthritic changes.  Past Medical History:  Diagnosis Date   Acute bronchitis    Allergic rhinitis, cause unspecified    Anemia    Anxiety    B12 deficiency    BV (bacterial vaginosis) 06/22/1996   Calculus of kidney    Calculus of kidney    Coronary atherosclerosis of unspecified type of vessel, native or graft    Dizziness    Essential hypertension, benign    Fibroid 2003   Fibromyalgia    H/O dysmenorrhea 2008    H/O varicella    Headache(784.0)    frequently   HSV-2 infection 2009   Hyperplastic colon polyp 05/16/2014   Irritable bowel syndrome    Meniere's disease, unspecified    Menses, irregular 2003   Myalgia and myositis, unspecified    Myocardial infarction ([place])    Obstructive sleep apnea (adult) (pediatric)    Perimenopausal symptoms 2003   Pure hypercholesterolemia    Sarcoidosis    Type II or unspecified type diabetes mellitus without mention of complication, not stated as uncontrolled    Unspecified venous (peripheral) insufficiency    Vitamin D deficiency    Vulvitis 2010   Yeast infection    Past Surgical History:  Procedure Laterality Date   COLONOSCOPY     CORONARY STENT INTERVENTION N/A 06/15/2017   Procedure: CORONARY STENT INTERVENTION;  Surgeon: Jettie Booze, MD;  Location: [facility];  Service: Cardiovascular;  Laterality: N/A;   heart catherization     LEFT HEART CATH AND CORONARY ANGIOGRAPHY N/A 06/15/2017   Procedure: LEFT HEART CATH AND CORONARY ANGIOGRAPHY;  Surgeon: Jettie Booze, MD;  Location: [facility];  Service: Cardiovascular;  Laterality: N/A;   TONSILLECTOMY     Social History   Socioeconomic History   Marital status: Single    Spouse name: Not on file   Number of children: 1   Years of education: Not on file   Highest education level: Not on file  Occupational History   Occupation: Retired Pharmacist, hospital   Tobacco Use   Smoking status: Never   Smokeless tobacco: Never   Tobacco comments:    Daily Caffeine - 1  Exercise 2-3 times/weekly  Vaping Use   Vaping Use: Never used  Substance and Sexual Activity   Alcohol use: No    Alcohol/week: 0.0 standard drinks   Drug use: No   Sexual activity: Yes    Birth control/protection: None  Other Topics Concern   Not on file  Social History Narrative   Exercises 2-3 times weekly   Caffeine Use: 1 daily (tea or Pepsi)   Lives alone in a one story home.  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   Teaches kindergarten.     Social Determinants of Health   Financial Resource Strain: Not on file  Food Insecurity: Not on file  Transportation Needs: Not on file  Physical Activity: Not on file  Stress: Not on file  Social Connections: Not on file   Allergies  Allergen Reactions   Actos [Pioglitazone] Other (See Comments)    REACTION: pt states INTOL w/ edema   Codeine Other (See Comments)    REACTION: vomiting   Januvia [Sitagliptin] Nausea Only   Lactose Intolerance (Gi) Diarrhea   Morphine Nausea Only and Nausea And Vomiting    REACTION: vomiting REACTION: vomiting   Repatha [Evolocumab]     WAS NOT EFFECTIVE IN LOWERING LDL   Family History  Adopted: Yes  Problem Relation Age of Onset   Other Other        ADOPTED   Colon cancer Neg Hx    Esophageal cancer Neg Hx    Rectal cancer Neg Hx    Stomach cancer Neg Hx     Current Outpatient Medications (Endocrine & Metabolic):    Empagliflozin-metFORMIN HCl ER 25-1000 MG TB24, Take 1 tablet by mouth daily.  Current Outpatient Medications (Cardiovascular):    Alirocumab (PRALUENT) 150 MG/ML SOAJ, Inject 1 pen into the skin every 14 (fourteen) days.   Bempedoic Acid (NEXLETOL) 180 MG TABS, Take 1 tablet by mouth daily.   diltiazem (CARDIZEM CD) 240 MG 24 hr capsule, Take 1 capsule (240 mg total) by mouth daily.   icosapent Ethyl (VASCEPA) 1 g capsule, Take 2 capsules (2 g total) by mouth 2 (two) times daily.   losartan (COZAAR) 100 MG tablet, TAKE 1 TABLET(100 MG) BY MOUTH DAILY   metoprolol tartrate (LOPRESSOR) 50 MG tablet, TAKE 1 TABLET(50 MG) BY MOUTH TWICE DAILY   nitroGLYCERIN (NITROSTAT) 0.4 MG SL tablet, Place 1 tablet (0.4 mg total) under the tongue every 5 (five) minutes as needed for chest pain.   spironolactone (ALDACTONE) 25 MG tablet, TAKE 1 TABLET(25 MG) BY MOUTH DAILY   Current Outpatient Medications (Analgesics):    meloxicam (MOBIC) 7.5 MG tablet, Take 7.5 mg by mouth daily.  Current Outpatient  Medications (Hematological):    clopidogrel (PLAVIX) 75 MG tablet, Take 1 tablet (75 mg total) by mouth daily.  Current Outpatient Medications (Other):    methotrexate (RHEUMATREX) 2.5 MG tablet, methotrexate sodium 2.5 mg tablet  take 8 tablets by mouth ONCE A WEEK   ondansetron (ZOFRAN ODT) 8 MG disintegrating tablet, Take 1 tablet (8 mg total) by mouth every 8 (eight) hours as needed for nausea or vomiting.   ondansetron (ZOFRAN) 4 MG tablet, Take 1 tablet (4 mg total) by mouth every 8 (eight) hours as needed for nausea or vomiting.   ONETOUCH VERIO test strip, CHECK BLOOD SUGAR TID AS DIRECTED  pantoprazole (PROTONIX) 40 MG tablet, Take 1 tablet (40 mg total) by mouth daily.   Reviewed prior external information including notes and imaging from  primary care provider As well as notes that were available from care everywhere and other healthcare systems.  Past medical history, social, surgical and family history all reviewed in electronic medical record.  No pertanent information unless stated regarding to the chief complaint.   Review of Systems:  No headache, visual changes, nausea, vomiting, diarrhea, constipation, dizziness, abdominal pain, skin rash, fevers, chills, night sweats, weight loss, swollen lymph nodes, body aches, joint swelling, chest pain, shortness of breath, mood changes. POSITIVE muscle aches  Objective  There were no vitals taken for this visit.   General: No apparent distress alert and oriented x3 mood and affect normal, dressed appropriately.  HEENT: Pupils equal, extraocular movements intact  Respiratory: Patient's speak in full sentences and does not appear short of breath  Cardiovascular: No lower extremity edema, non tender, no erythema  Gait normal with good balance and coordination.  MSK:  .     Impression and Recommendations:     The above documentation has been reviewed and is accurate and complete Lyndal Pulley, DO

## 2021-10-06 ENCOUNTER — Ambulatory Visit: Payer: Medicare PPO | Admitting: Family Medicine

## 2021-10-06 NOTE — Progress Notes (Deleted)
[name] Phone: [phone] Subjective:    I'm seeing this patient by the request  of:  Patient, No Pcp Per (Inactive)  CC:   CBS:[id number]  08/20/2021 Degenerative arthritis of the knee noted.  Discussed with patient about icing regimen and home exercises.  Discussed that I do think that the fall may have exacerbated an underlying condition.  Patient has had x-rays showing the patient did have severe arthritis of this knee.  Do not feel any increasing instability but patient does have instability.  We have had patient have a custom brace previously which she does not wear on a regular basis.  We discussed because patient did have an effusion we could consider the possibility of aspiration which patient declined today but we may reconsider if it continues to give her difficulty.  Discussed with formal physical therapy.  Patient is likely though to start a Workmen's Comp. case and may be seen another provider.  Otherwise with patient will follow-up again in 4 weeks.  Patient was given a abbreviated work schedule note today.  Update 10/12/2021 Jocelyn [name] is a 65 y.o. female coming in with complaint of R knee pain.   Onset-  Location Duration-  Character- Aggravating factors- Reliving factors-  Therapies tried-  Severity-     Past Medical History:  Diagnosis Date   Acute bronchitis    Allergic rhinitis, cause unspecified    Anemia    Anxiety    B12 deficiency    BV (bacterial vaginosis) 06/22/1996   Calculus of kidney    Calculus of kidney    Coronary atherosclerosis of unspecified type of vessel, native or graft    Dizziness    Essential hypertension, benign    Fibroid 2003   Fibromyalgia    H/O dysmenorrhea 2008   H/O varicella    Headache(784.0)    frequently   HSV-2 infection 2009   Hyperplastic colon polyp 05/16/2014   Irritable bowel syndrome    Meniere's disease, unspecified    Menses,  irregular 2003   Myalgia and myositis, unspecified    Myocardial infarction ([place])    Obstructive sleep apnea (adult) (pediatric)    Perimenopausal symptoms 2003   Pure hypercholesterolemia    Sarcoidosis    Type II or unspecified type diabetes mellitus without mention of complication, not stated as uncontrolled    Unspecified venous (peripheral) insufficiency    Vitamin D deficiency    Vulvitis 2010   Yeast infection    Past Surgical History:  Procedure Laterality Date   COLONOSCOPY     CORONARY STENT INTERVENTION N/A 06/15/2017   Procedure: CORONARY STENT INTERVENTION;  Surgeon: Jettie Booze, MD;  Location: [facility];  Service: Cardiovascular;  Laterality: N/A;   heart catherization     LEFT HEART CATH AND CORONARY ANGIOGRAPHY N/A 06/15/2017   Procedure: LEFT HEART CATH AND CORONARY ANGIOGRAPHY;  Surgeon: Jettie Booze, MD;  Location: [facility];  Service: Cardiovascular;  Laterality: N/A;   TONSILLECTOMY     Social History   Socioeconomic History   Marital status: Single    Spouse name: Not on file   Number of children: 1   Years of education: Not on file   Highest education level: Not on file  Occupational History   Occupation: Retired Pharmacist, hospital   Tobacco Use   Smoking status: Never   Smokeless tobacco: Never   Tobacco comments:    Daily  Caffeine - 1  Exercise 2-3 times/weekly  Vaping Use   Vaping Use: Never used  Substance and Sexual Activity   Alcohol use: No    Alcohol/week: 0.0 standard drinks   Drug use: No   Sexual activity: Yes    Birth control/protection: None  Other Topics Concern   Not on file  Social History Narrative   Exercises 2-3 times weekly   Caffeine Use: 1 daily (tea or Pepsi)   Lives alone in a one story home.  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     Social Determinants of Health   Financial Resource Strain: Not on file  Food Insecurity: Not on file  Transportation Needs: Not on file  Physical Activity:  Not on file  Stress: Not on file  Social Connections: Not on file   Allergies  Allergen Reactions   Actos [Pioglitazone] Other (See Comments)    REACTION: pt states INTOL w/ edema   Codeine Other (See Comments)    REACTION: vomiting   Januvia [Sitagliptin] Nausea Only   Lactose Intolerance (Gi) Diarrhea   Morphine Nausea Only and Nausea And Vomiting    REACTION: vomiting REACTION: vomiting   Repatha [Evolocumab]     WAS NOT EFFECTIVE IN LOWERING LDL   Family History  Adopted: Yes  Problem Relation Age of Onset   Other Other        ADOPTED   Colon cancer Neg Hx    Esophageal cancer Neg Hx    Rectal cancer Neg Hx    Stomach cancer Neg Hx     Current Outpatient Medications (Endocrine & Metabolic):    Empagliflozin-metFORMIN HCl ER 25-1000 MG TB24, Take 1 tablet by mouth daily.  Current Outpatient Medications (Cardiovascular):    Alirocumab (PRALUENT) 150 MG/ML SOAJ, Inject 1 pen into the skin every 14 (fourteen) days.   Bempedoic Acid (NEXLETOL) 180 MG TABS, Take 1 tablet by mouth daily.   diltiazem (CARDIZEM CD) 240 MG 24 hr capsule, TAKE 1 CAPSULE(240 MG) BY MOUTH DAILY   icosapent Ethyl (VASCEPA) 1 g capsule, Take 2 capsules (2 g total) by mouth 2 (two) times daily.   losartan (COZAAR) 100 MG tablet, TAKE 1 TABLET(100 MG) BY MOUTH DAILY   metoprolol tartrate (LOPRESSOR) 50 MG tablet, TAKE 1 TABLET(50 MG) BY MOUTH TWICE DAILY   nitroGLYCERIN (NITROSTAT) 0.4 MG SL tablet, Place 1 tablet (0.4 mg total) under the tongue every 5 (five) minutes as needed for chest pain.   spironolactone (ALDACTONE) 25 MG tablet, TAKE 1 TABLET(25 MG) BY MOUTH DAILY   Current Outpatient Medications (Analgesics):    meloxicam (MOBIC) 7.5 MG tablet, Take 7.5 mg by mouth daily.  Current Outpatient Medications (Hematological):    clopidogrel (PLAVIX) 75 MG tablet, Take 1 tablet (75 mg total) by mouth daily.  Current Outpatient Medications (Other):    methotrexate (RHEUMATREX) 2.5 MG tablet,  methotrexate sodium 2.5 mg tablet  take 8 tablets by mouth ONCE A WEEK   ondansetron (ZOFRAN ODT) 8 MG disintegrating tablet, Take 1 tablet (8 mg total) by mouth every 8 (eight) hours as needed for nausea or vomiting.   ondansetron (ZOFRAN) 4 MG tablet, Take 1 tablet (4 mg total) by mouth every 8 (eight) hours as needed for nausea or vomiting.   ONETOUCH VERIO test strip, CHECK BLOOD SUGAR TID AS DIRECTED   pantoprazole (PROTONIX) 40 MG tablet, Take 1 tablet (40 mg total) by mouth daily.   Reviewed prior external information including notes and imaging from  primary care provider  As well as notes that were available from care everywhere and other healthcare systems.  Past medical history, social, surgical and family history all reviewed in electronic medical record.  No pertanent information unless stated regarding to the chief complaint.   Review of Systems:  No headache, visual changes, nausea, vomiting, diarrhea, constipation, dizziness, abdominal pain, skin rash, fevers, chills, night sweats, weight loss, swollen lymph nodes, body aches, joint swelling, chest pain, shortness of breath, mood changes. POSITIVE muscle aches  Objective  There were no vitals taken for this visit.   General: No apparent distress alert and oriented x3 mood and affect normal, dressed appropriately.  HEENT: Pupils equal, extraocular movements intact  Respiratory: Patient's speak in full sentences and does not appear short of breath  Cardiovascular: No lower extremity edema, non tender, no erythema  Gait normal with good balance and coordination.  MSK:  Non tender with full range of motion and good stability and symmetric strength and tone of shoulders, elbows, wrist, hip, knee and ankles bilaterally.     Impression and Recommendations:     The above documentation has been reviewed and is accurate and complete Jocelyn [name]

## 2021-10-12 ENCOUNTER — Ambulatory Visit: Payer: Medicare PPO | Admitting: Family Medicine

## 2021-10-16 ENCOUNTER — Ambulatory Visit: Payer: Medicare PPO | Admitting: Family Medicine

## 2021-10-16 NOTE — Progress Notes (Signed)
[place] [address] [place] Phone: [phone] Subjective:   [name], am serving as a scribe for Dr. Hulan Saas. This visit occurred during the SARS-CoV-2 public health emergency.  Safety protocols were in place, including screening questions prior to the visit, additional usage of staff PPE, and extensive cleaning of exam room while observing appropriate contact time as indicated for disinfecting solutions.   I'm seeing this patient by the request  of:  Patient, No Pcp Per (Inactive)  CC: Knee pain follow-up  TMA:[id number]  101/04/2021 Degenerative arthritis of the knee noted.  Discussed with patient about icing regimen and home exercises.  Discussed that I do think that the fall may have exacerbated an underlying condition.  Patient has had x-rays showing the patient did have severe arthritis of this knee.  Do not feel any increasing instability but patient does have instability.  We have had patient have a custom brace previously which she does not wear on a regular basis.  We discussed because patient did have an effusion we could consider the possibility of aspiration which patient declined today but we may reconsider if it continues to give her difficulty.  Discussed with formal physical therapy.  Patient is likely though to start a Workmen's Comp. case and may be seen another provider.  Otherwise with patient will follow-up again in 4 weeks.  Patient was given a abbreviated work schedule note today.  Update 10/19/2021 Alaiza Yau Tayler is a 65 y.o. female coming in with complaint of B knee pain. Patient states pain has been about the same. No other complaints.  Both x-rays of the knees were independently visualized by me from 2020 showing moderate to severe degenerative joint disease.  Right greater than left.  New x-rays of the right knee though did not show any significant worsening from previous x-rays.     Past Medical  History:  Diagnosis Date   Acute bronchitis    Allergic rhinitis, cause unspecified    Anemia    Anxiety    B12 deficiency    BV (bacterial vaginosis) 06/22/1996   Calculus of kidney    Calculus of kidney    Coronary atherosclerosis of unspecified type of vessel, native or graft    Dizziness    Essential hypertension, benign    Fibroid 2003   Fibromyalgia    H/O dysmenorrhea 2008   H/O varicella    Headache(784.0)    frequently   HSV-2 infection 2009   Hyperplastic colon polyp 05/16/2014   Irritable bowel syndrome    Meniere's disease, unspecified    Menses, irregular 2003   Myalgia and myositis, unspecified    Myocardial infarction ([place])    Obstructive sleep apnea (adult) (pediatric)    Perimenopausal symptoms 2003   Pure hypercholesterolemia    Sarcoidosis    Type II or unspecified type diabetes mellitus without mention of complication, not stated as uncontrolled    Unspecified venous (peripheral) insufficiency    Vitamin D deficiency    Vulvitis 2010   Yeast infection    Past Surgical History:  Procedure Laterality Date   COLONOSCOPY     CORONARY STENT INTERVENTION N/A 06/15/2017   Procedure: CORONARY STENT INTERVENTION;  Surgeon: Jettie Booze, MD;  Location: [facility];  Service: Cardiovascular;  Laterality: N/A;   heart catherization     LEFT HEART CATH AND CORONARY ANGIOGRAPHY N/A 06/15/2017   Procedure: LEFT HEART CATH AND CORONARY ANGIOGRAPHY;  Surgeon: Irish Lack,  Charlann Lange, MD;  Location: [facility];  Service: Cardiovascular;  Laterality: N/A;   TONSILLECTOMY     Social History   Socioeconomic History   Marital status: Single    Spouse name: Not on file   Number of children: 1   Years of education: Not on file   Highest education level: Not on file  Occupational History   Occupation: Retired Pharmacist, hospital   Tobacco Use   Smoking status: Never   Smokeless tobacco: Never   Tobacco comments:    Daily Caffeine - 1  Exercise 2-3 times/weekly   Vaping Use   Vaping Use: Never used  Substance and Sexual Activity   Alcohol use: No    Alcohol/week: 0.0 standard drinks   Drug use: No   Sexual activity: Yes    Birth control/protection: None  Other Topics Concern   Not on file  Social History Narrative   Exercises 2-3 times weekly   Caffeine Use: 1 daily (tea or Pepsi)   Lives alone in a one story home.  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     Social Determinants of Health   Financial Resource Strain: Not on file  Food Insecurity: Not on file  Transportation Needs: Not on file  Physical Activity: Not on file  Stress: Not on file  Social Connections: Not on file   Allergies  Allergen Reactions   Actos [Pioglitazone] Other (See Comments)    REACTION: pt states INTOL w/ edema   Codeine Other (See Comments)    REACTION: vomiting   Januvia [Sitagliptin] Nausea Only   Lactose Intolerance (Gi) Diarrhea   Morphine Nausea Only and Nausea And Vomiting    REACTION: vomiting REACTION: vomiting   Repatha [Evolocumab]     WAS NOT EFFECTIVE IN LOWERING LDL   Family History  Adopted: Yes  Problem Relation Age of Onset   Other Other        ADOPTED   Colon cancer Neg Hx    Esophageal cancer Neg Hx    Rectal cancer Neg Hx    Stomach cancer Neg Hx     Current Outpatient Medications (Endocrine & Metabolic):    Empagliflozin-metFORMIN HCl ER 25-1000 MG TB24, Take 1 tablet by mouth daily.  Current Outpatient Medications (Cardiovascular):    Alirocumab (PRALUENT) 150 MG/ML SOAJ, Inject 1 pen into the skin every 14 (fourteen) days.   Bempedoic Acid (NEXLETOL) 180 MG TABS, Take 1 tablet by mouth daily.   diltiazem (CARDIZEM CD) 240 MG 24 hr capsule, TAKE 1 CAPSULE(240 MG) BY MOUTH DAILY   icosapent Ethyl (VASCEPA) 1 g capsule, Take 2 capsules (2 g total) by mouth 2 (two) times daily.   losartan (COZAAR) 100 MG tablet, TAKE 1 TABLET(100 MG) BY MOUTH DAILY   metoprolol tartrate (LOPRESSOR) 50 MG tablet, TAKE 1 TABLET(50 MG) BY  MOUTH TWICE DAILY   nitroGLYCERIN (NITROSTAT) 0.4 MG SL tablet, Place 1 tablet (0.4 mg total) under the tongue every 5 (five) minutes as needed for chest pain.   spironolactone (ALDACTONE) 25 MG tablet, TAKE 1 TABLET(25 MG) BY MOUTH DAILY   Current Outpatient Medications (Analgesics):    meloxicam (MOBIC) 7.5 MG tablet, Take 7.5 mg by mouth daily.  Current Outpatient Medications (Hematological):    clopidogrel (PLAVIX) 75 MG tablet, Take 1 tablet (75 mg total) by mouth daily.  Current Outpatient Medications (Other):    methotrexate (RHEUMATREX) 2.5 MG tablet, methotrexate sodium 2.5 mg tablet  take 8 tablets by mouth ONCE A WEEK  ondansetron (ZOFRAN ODT) 8 MG disintegrating tablet, Take 1 tablet (8 mg total) by mouth every 8 (eight) hours as needed for nausea or vomiting.   ondansetron (ZOFRAN) 4 MG tablet, Take 1 tablet (4 mg total) by mouth every 8 (eight) hours as needed for nausea or vomiting.   ONETOUCH VERIO test strip, CHECK BLOOD SUGAR TID AS DIRECTED   pantoprazole (PROTONIX) 40 MG tablet, Take 1 tablet (40 mg total) by mouth daily.   Reviewed prior external information including notes and imaging from  primary care provider As well as notes that were available from care everywhere and other healthcare systems.  Past medical history, social, surgical and family history all reviewed in electronic medical record.  No pertanent information unless stated regarding to the chief complaint.   Review of Systems:  No headache, visual changes, nausea, vomiting, diarrhea, constipation, dizziness, abdominal pain, skin rash, fevers, chills, night sweats, weight loss, swollen lymph nodes, body aches, joint swelling, chest pain, shortness of breath, mood changes. POSITIVE muscle aches  Objective  Pulse 73, height 5\' 2"  (1.575 m), weight 157 lb (71.2 kg), SpO2 96 %.   General: No apparent distress alert and oriented x3 mood and affect normal, dressed appropriately.  HEENT: Pupils equal,  extraocular movements intact  Respiratory: Patient's speak in full sentences and does not appear short of breath  Cardiovascular: Trace lower extremity edema, non tender, no erythema  Gait antalgic Knee exam shows the patient does have arthritic changes in the knees bilaterally.  Instability with valgus and varus force.  Worse on the right side.  Patient has limited range of motion with trace effusion.  After informed written and verbal consent, patient was seated on exam table. Right knee was prepped with alcohol swab and utilizing anterolateral approach, patient's right knee space was injected with 4:1  marcaine 0.5%: Kenalog 40mg /dL. Patient tolerated the procedure well without immediate complications.    Impression and Recommendations:     The above documentation has been reviewed and is accurate and complete Lyndal Pulley, DO

## 2021-10-19 ENCOUNTER — Ambulatory Visit: Payer: Medicare PPO | Admitting: Family Medicine

## 2021-10-19 ENCOUNTER — Other Ambulatory Visit: Payer: Self-pay

## 2021-10-19 ENCOUNTER — Encounter: Payer: Self-pay | Admitting: Family Medicine

## 2021-10-19 DIAGNOSIS — M1711 Unilateral primary osteoarthritis, right knee: Secondary | ICD-10-CM

## 2021-10-19 NOTE — Assessment & Plan Note (Signed)
Injection given today and tolerated the procedure well.  Discussed icing regimen and home exercises.  Discussed x-ray to examine which was avoid.  Patient could be a candidate for viscosupplementation.  Patient will follow up with me again in 6 weeks and we will discuss how patient is doing at that time.  Patient has not seen another provider for her Workmen's Comp. who did want to do a replacement which she does want to avoid if possible.

## 2021-10-19 NOTE — Patient Instructions (Signed)
Injection today See you again in 6 weeks

## 2021-10-23 DIAGNOSIS — I2510 Atherosclerotic heart disease of native coronary artery without angina pectoris: Secondary | ICD-10-CM | POA: Diagnosis not present

## 2021-10-23 DIAGNOSIS — Z7984 Long term (current) use of oral hypoglycemic drugs: Secondary | ICD-10-CM | POA: Diagnosis not present

## 2021-10-23 DIAGNOSIS — E1151 Type 2 diabetes mellitus with diabetic peripheral angiopathy without gangrene: Secondary | ICD-10-CM | POA: Diagnosis not present

## 2021-10-23 DIAGNOSIS — D3502 Benign neoplasm of left adrenal gland: Secondary | ICD-10-CM | POA: Diagnosis not present

## 2021-10-23 DIAGNOSIS — E669 Obesity, unspecified: Secondary | ICD-10-CM | POA: Diagnosis not present

## 2021-11-18 ENCOUNTER — Telehealth: Payer: Self-pay | Admitting: Interventional Cardiology

## 2021-11-18 NOTE — Telephone Encounter (Signed)
° °  Pt c/o medication issue:  1. Name of Medication: Alirocumab (PRALUENT) 150 MG/ML SOAJ  2. How are you currently taking this medication (dosage and times per day)? Inject 1 pen into the skin every 14 (fourteen) days.  3. Are you having a reaction (difficulty breathing--STAT)?   4. What is your medication issue? Pt requesting to speak with [facility] regarding her pt assistance for her praluent

## 2021-11-19 NOTE — Telephone Encounter (Signed)
Called and [employer] information given to the patient and they voiced gratitude and understanding to take that with them to the pharmacy  Pharmacy Card CARD NO. [id number]   CARD STATUS Active   BIN [id number]   PCN [id number]   PC GROUP [id number]

## 2021-12-01 NOTE — Progress Notes (Deleted)
[place] Phone: [phone] Subjective:    I'm seeing this patient by the request  of:  Patient, No Pcp Per (Inactive)  CC:   [id number]  10/19/2022 Injection given today and tolerated the procedure well.  Discussed icing regimen and home exercises.  Discussed x-ray to examine which was avoid.  Patient could be a candidate for viscosupplementation.  Patient will follow up with me again in 6 weeks and we will discuss how patient is doing at that time.  Patient has not seen another provider for her Workmen's Comp. who did want to do a replacement which she does want to avoid if possible.  Updated 12/02/2021 Leeanne Mannan Kirley is a 66 y.o. female coming in with complaint of bilateral knee pain       Past Medical History:  Diagnosis Date   Acute bronchitis    Allergic rhinitis, cause unspecified    Anemia    Anxiety    B12 deficiency    BV (bacterial vaginosis) 06/22/1996   Calculus of kidney    Calculus of kidney    Coronary atherosclerosis of unspecified type of vessel, native or graft    Dizziness    Essential hypertension, benign    Fibroid 2003   Fibromyalgia    H/O dysmenorrhea 2008   H/O varicella    Headache(784.0)    frequently   HSV-2 infection 2009   Hyperplastic colon polyp 05/16/2014   Irritable bowel syndrome    Meniere's disease, unspecified    Menses, irregular 2003   Myalgia and myositis, unspecified    Myocardial infarction ([place])    Obstructive sleep apnea (adult) (pediatric)    Perimenopausal symptoms 2003   Pure hypercholesterolemia    Sarcoidosis    Type II or unspecified type diabetes mellitus without mention of complication, not stated as uncontrolled    Unspecified venous (peripheral) insufficiency    Vitamin D deficiency    Vulvitis 2010   Yeast infection    Past Surgical History:  Procedure Laterality Date   COLONOSCOPY     CORONARY STENT INTERVENTION N/A 06/15/2017    Procedure: CORONARY STENT INTERVENTION;  Surgeon: Jettie Booze, MD;  Location: [facility];  Service: Cardiovascular;  Laterality: N/A;   heart catherization     LEFT HEART CATH AND CORONARY ANGIOGRAPHY N/A 06/15/2017   Procedure: LEFT HEART CATH AND CORONARY ANGIOGRAPHY;  Surgeon: Jettie Booze, MD;  Location: [facility];  Service: Cardiovascular;  Laterality: N/A;   TONSILLECTOMY     Social History   Socioeconomic History   Marital status: Single    Spouse name: Not on file   Number of children: 1   Years of education: Not on file   Highest education level: Not on file  Occupational History   Occupation: Retired Pharmacist, hospital   Tobacco Use   Smoking status: Never   Smokeless tobacco: Never   Tobacco comments:    Daily Caffeine - 1  Exercise 2-3 times/weekly  Vaping Use   Vaping Use: Never used  Substance and Sexual Activity   Alcohol use: No    Alcohol/week: 0.0 standard drinks   Drug use: No   Sexual activity: Yes    Birth control/protection: None  Other Topics Concern   Not on file  Social History Narrative   Exercises 2-3 times weekly   Caffeine Use: 1 daily (tea or Pepsi)   Lives alone in a one story home.  Has  one daughter.  Teaches kindergarten.     Social Determinants of Health   Financial Resource Strain: Not on file  Food Insecurity: Not on file  Transportation Needs: Not on file  Physical Activity: Not on file  Stress: Not on file  Social Connections: Not on file   Allergies  Allergen Reactions   Actos [Pioglitazone] Other (See Comments)    REACTION: pt states INTOL w/ edema   Codeine Other (See Comments)    REACTION: vomiting   Januvia [Sitagliptin] Nausea Only   Lactose Intolerance (Gi) Diarrhea   Morphine Nausea Only and Nausea And Vomiting    REACTION: vomiting REACTION: vomiting   Repatha [Evolocumab]     WAS NOT EFFECTIVE IN LOWERING LDL   Family History  Adopted: Yes  Problem Relation Age of Onset   Other Other         ADOPTED   Colon cancer Neg Hx    Esophageal cancer Neg Hx    Rectal cancer Neg Hx    Stomach cancer Neg Hx     Current Outpatient Medications (Endocrine & Metabolic):    Empagliflozin-metFORMIN HCl ER 25-1000 MG TB24, Take 1 tablet by mouth daily.  Current Outpatient Medications (Cardiovascular):    Alirocumab (PRALUENT) 150 MG/ML SOAJ, Inject 1 pen into the skin every 14 (fourteen) days.   Bempedoic Acid (NEXLETOL) 180 MG TABS, Take 1 tablet by mouth daily.   diltiazem (CARDIZEM CD) 240 MG 24 hr capsule, TAKE 1 CAPSULE(240 MG) BY MOUTH DAILY   icosapent Ethyl (VASCEPA) 1 g capsule, Take 2 capsules (2 g total) by mouth 2 (two) times daily.   losartan (COZAAR) 100 MG tablet, TAKE 1 TABLET(100 MG) BY MOUTH DAILY   metoprolol tartrate (LOPRESSOR) 50 MG tablet, TAKE 1 TABLET(50 MG) BY MOUTH TWICE DAILY   nitroGLYCERIN (NITROSTAT) 0.4 MG SL tablet, Place 1 tablet (0.4 mg total) under the tongue every 5 (five) minutes as needed for chest pain.   spironolactone (ALDACTONE) 25 MG tablet, TAKE 1 TABLET(25 MG) BY MOUTH DAILY   Current Outpatient Medications (Analgesics):    meloxicam (MOBIC) 7.5 MG tablet, Take 7.5 mg by mouth daily.  Current Outpatient Medications (Hematological):    clopidogrel (PLAVIX) 75 MG tablet, Take 1 tablet (75 mg total) by mouth daily.  Current Outpatient Medications (Other):    methotrexate (RHEUMATREX) 2.5 MG tablet, methotrexate sodium 2.5 mg tablet  take 8 tablets by mouth ONCE A WEEK   ondansetron (ZOFRAN ODT) 8 MG disintegrating tablet, Take 1 tablet (8 mg total) by mouth every 8 (eight) hours as needed for nausea or vomiting.   ondansetron (ZOFRAN) 4 MG tablet, Take 1 tablet (4 mg total) by mouth every 8 (eight) hours as needed for nausea or vomiting.   ONETOUCH VERIO test strip, CHECK BLOOD SUGAR TID AS DIRECTED   pantoprazole (PROTONIX) 40 MG tablet, Take 1 tablet (40 mg total) by mouth daily.   Reviewed prior external information including notes and  imaging from  primary care provider As well as notes that were available from care everywhere and other healthcare systems.  Past medical history, social, surgical and family history all reviewed in electronic medical record.  No pertanent information unless stated regarding to the chief complaint.   Review of Systems:  No headache, visual changes, nausea, vomiting, diarrhea, constipation, dizziness, abdominal pain, skin rash, fevers, chills, night sweats, weight loss, swollen lymph nodes, body aches, joint swelling, chest pain, shortness of breath, mood changes. POSITIVE muscle aches  Objective  There were no  vitals taken for this visit.   General: No apparent distress alert and oriented x3 mood and affect normal, dressed appropriately.  HEENT: Pupils equal, extraocular movements intact  Respiratory: Patient's speak in full sentences and does not appear short of breath  Cardiovascular: No lower extremity edema, non tender, no erythema  Gait normal with good balance and coordination.  MSK:  Non tender with full range of motion and good stability and symmetric strength and tone of shoulders, elbows, wrist, hip, knee and ankles bilaterally.     Impression and Recommendations:     The above documentation has been reviewed and is accurate and complete Belva Agee

## 2021-12-02 ENCOUNTER — Ambulatory Visit: Payer: Medicare PPO | Admitting: Family Medicine

## 2021-12-07 ENCOUNTER — Other Ambulatory Visit: Payer: Self-pay

## 2021-12-07 ENCOUNTER — Telehealth: Payer: Self-pay

## 2021-12-07 MED ORDER — VASCEPA 1 G PO CAPS: 2.0000 g | ORAL_CAPSULE | Freq: Two times a day (BID) | ORAL | 3 refills | Status: DC

## 2021-12-07 NOTE — Telephone Encounter (Signed)
**Note De-Identified  Obfuscation** We received a Icosapent PA request. I started it through covermymeds and received this message: [employer]'s covered (formulary) drugs are Vascepa (brand), simvastatin tablet, rosuvastatin tablet, and atorvastatin tablet.  I changed the pts generic Icosapent to name brand Vascepa in her chart and e-scribed it to [facility] with a note to the pharmacist stating that [facility] prefers Vascepa.

## 2021-12-08 NOTE — Progress Notes (Signed)
[place] [address] [place] Phone: [phone] Subjective:   [name], am serving as a scribe for Dr. Hulan Saas. This visit occurred during the SARS-CoV-2 public health emergency.  Safety protocols were in place, including screening questions prior to the visit, additional usage of staff PPE, and extensive cleaning of exam room while observing appropriate contact time as indicated for disinfecting solutions.   I'm seeing this patient by the request  of:  Patient, No Pcp Per (Inactive)  CC: Bilateral knee pain follow-up  [id number]  10/19/2021 Injection given today and tolerated the procedure well.  Discussed icing regimen and home exercises.  Discussed x-ray to examine which was avoid.  Patient could be a candidate for viscosupplementation.  Patient will follow up with me again in 6 weeks and we will discuss how patient is doing at that time.  Patient has not seen another provider for her Workmen's Comp. who did want to do a replacement which she does want to avoid if possible.  Updated 12/09/2021 Jocelyn [name] is a 66 y.o. female coming in with complaint of bilateral knee pain.  This is Going on for quite some time.  Patient has had injections previously with mild improvement.  Patient would like to try other conservative therapy and avoid surgical intervention.  Once again patient has seen other providers including her Workmen's Comp. case but was told he needed replacement.  Right knee x-rays taken in October 2022 shows mild to moderate arthritic changes.  Patient states after injection felt better, but had a fall which set her back a little. Work is still causing pain.       Past Medical History:  Diagnosis Date   Acute bronchitis    Allergic rhinitis, cause unspecified    Anemia    Anxiety    B12 deficiency    BV (bacterial vaginosis) 06/22/1996   Calculus of kidney    Calculus of kidney    Coronary  atherosclerosis of unspecified type of vessel, native or graft    Dizziness    Essential hypertension, benign    Fibroid 2003   Fibromyalgia    H/O dysmenorrhea 2008   H/O varicella    Headache(784.0)    frequently   HSV-2 infection 2009   Hyperplastic colon polyp 05/16/2014   Irritable bowel syndrome    Meniere's disease, unspecified    Menses, irregular 2003   Myalgia and myositis, unspecified    Myocardial infarction ([place])    Obstructive sleep apnea (adult) (pediatric)    Perimenopausal symptoms 2003   Pure hypercholesterolemia    Sarcoidosis    Type II or unspecified type diabetes mellitus without mention of complication, not stated as uncontrolled    Unspecified venous (peripheral) insufficiency    Vitamin D deficiency    Vulvitis 2010   Yeast infection    Past Surgical History:  Procedure Laterality Date   COLONOSCOPY     CORONARY STENT INTERVENTION N/A 06/15/2017   Procedure: CORONARY STENT INTERVENTION;  Surgeon: Jettie Booze, MD;  Location: [facility];  Service: Cardiovascular;  Laterality: N/A;   heart catherization     LEFT HEART CATH AND CORONARY ANGIOGRAPHY N/A 06/15/2017   Procedure: LEFT HEART CATH AND CORONARY ANGIOGRAPHY;  Surgeon: Jettie Booze, MD;  Location: [facility];  Service: Cardiovascular;  Laterality: N/A;   TONSILLECTOMY     Social History   Socioeconomic History   Marital status: Single  Spouse name: Not on file   Number of children: 1   Years of education: Not on file   Highest education level: Not on file  Occupational History   Occupation: Retired Pharmacist, hospital   Tobacco Use   Smoking status: Never   Smokeless tobacco: Never   Tobacco comments:    Daily Caffeine - 1  Exercise 2-3 times/weekly  Vaping Use   Vaping Use: Never used  Substance and Sexual Activity   Alcohol use: No    Alcohol/week: 0.0 standard drinks   Drug use: No   Sexual activity: Yes    Birth control/protection: None  Other Topics Concern    Not on file  Social History Narrative   Exercises 2-3 times weekly   Caffeine Use: 1 daily (tea or Pepsi)   Lives alone in a one story home.  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     Social Determinants of Health   Financial Resource Strain: Not on file  Food Insecurity: Not on file  Transportation Needs: Not on file  Physical Activity: Not on file  Stress: Not on file  Social Connections: Not on file   Allergies  Allergen Reactions   Actos [Pioglitazone] Other (See Comments)    REACTION: pt states INTOL w/ edema   Codeine Other (See Comments)    REACTION: vomiting   Januvia [Sitagliptin] Nausea Only   Lactose Intolerance (Gi) Diarrhea   Morphine Nausea Only and Nausea And Vomiting    REACTION: vomiting REACTION: vomiting   Repatha [Evolocumab]     WAS NOT EFFECTIVE IN LOWERING LDL   Family History  Adopted: Yes  Problem Relation Age of Onset   Other Other        ADOPTED   Colon cancer Neg Hx    Esophageal cancer Neg Hx    Rectal cancer Neg Hx    Stomach cancer Neg Hx     Current Outpatient Medications (Endocrine & Metabolic):    Empagliflozin-metFORMIN HCl ER 25-1000 MG TB24, Take 1 tablet by mouth daily.  Current Outpatient Medications (Cardiovascular):    Alirocumab (PRALUENT) 150 MG/ML SOAJ, Inject 1 pen into the skin every 14 (fourteen) days.   Bempedoic Acid (NEXLETOL) 180 MG TABS, Take 1 tablet by mouth daily.   diltiazem (CARDIZEM CD) 240 MG 24 hr capsule, TAKE 1 CAPSULE(240 MG) BY MOUTH DAILY   losartan (COZAAR) 100 MG tablet, TAKE 1 TABLET(100 MG) BY MOUTH DAILY   metoprolol tartrate (LOPRESSOR) 50 MG tablet, TAKE 1 TABLET(50 MG) BY MOUTH TWICE DAILY   nitroGLYCERIN (NITROSTAT) 0.4 MG SL tablet, Place 1 tablet (0.4 mg total) under the tongue every 5 (five) minutes as needed for chest pain.   spironolactone (ALDACTONE) 25 MG tablet, TAKE 1 TABLET(25 MG) BY MOUTH DAILY   VASCEPA 1 g capsule, Take 2 capsules (2 g total) by mouth 2 (two) times  daily.   Current Outpatient Medications (Analgesics):    meloxicam (MOBIC) 7.5 MG tablet, Take 7.5 mg by mouth daily.  Current Outpatient Medications (Hematological):    clopidogrel (PLAVIX) 75 MG tablet, Take 1 tablet (75 mg total) by mouth daily.  Current Outpatient Medications (Other):    methotrexate (RHEUMATREX) 2.5 MG tablet, methotrexate sodium 2.5 mg tablet  take 8 tablets by mouth ONCE A WEEK   ondansetron (ZOFRAN ODT) 8 MG disintegrating tablet, Take 1 tablet (8 mg total) by mouth every 8 (eight) hours as needed for nausea or vomiting.   ondansetron (ZOFRAN) 4 MG tablet, Take 1 tablet (4 mg total)  by mouth every 8 (eight) hours as needed for nausea or vomiting.   ONETOUCH VERIO test strip, CHECK BLOOD SUGAR TID AS DIRECTED   pantoprazole (PROTONIX) 40 MG tablet, Take 1 tablet (40 mg total) by mouth daily.   Reviewed prior external information including notes and imaging from  primary care provider As well as notes that were available from care everywhere and other healthcare systems.  Past medical history, social, surgical and family history all reviewed in electronic medical record.  No pertanent information unless stated regarding to the chief complaint.   Review of Systems:  No headache, visual changes, nausea, vomiting, diarrhea, constipation, dizziness, abdominal pain, skin rash, fevers, chills, night sweats, weight loss, swollen lymph nodes, body aches, joint swelling, chest pain, shortness of breath, mood changes. POSITIVE muscle aches  Objective  Blood pressure 110/62, pulse 74, height 5\' 2"  (1.575 m), weight 165 lb (74.8 kg), SpO2 96 %.   General: No apparent distress alert and oriented x3 mood and affect normal, dressed appropriately.  HEENT: Pupils equal, extraocular movements intact  Respiratory: Patient's speak in full sentences and does not appear short of breath  Cardiovascular: No lower extremity edema, non tender, no erythema  Gait antalgic Knee exam  shows Tender to palpation.  Patient does have a resolving bruise noted on the anterior aspect of the knee.  Patient does have still instability noted with valgus and varus force.    Impression and Recommendations:     The above documentation has been reviewed and is accurate and complete Lyndal Pulley, DO

## 2021-12-09 ENCOUNTER — Ambulatory Visit: Payer: Medicare PPO | Admitting: Family Medicine

## 2021-12-09 ENCOUNTER — Other Ambulatory Visit: Payer: Self-pay

## 2021-12-09 DIAGNOSIS — M1711 Unilateral primary osteoarthritis, right knee: Secondary | ICD-10-CM | POA: Diagnosis not present

## 2021-12-09 NOTE — Assessment & Plan Note (Signed)
Discussed with patient at great length.  Patient has been doing relatively well.  Did have an injection a month ago but feels like if she did not fall she would have been doing much better.  We will not make any significant other changes at the moment.  Patient wants to give it another 1 to 2 months to see how patient does.  Worsening pain patient could actually be a candidate for viscosupplementation if needed.

## 2021-12-09 NOTE — Patient Instructions (Addendum)
Arnica or capsasiin 2x a day See me in 4 weeks if not better will consider gel injection

## 2021-12-23 DIAGNOSIS — Z79899 Other long term (current) drug therapy: Secondary | ICD-10-CM | POA: Diagnosis not present

## 2021-12-23 DIAGNOSIS — Z6830 Body mass index (BMI) 30.0-30.9, adult: Secondary | ICD-10-CM | POA: Diagnosis not present

## 2021-12-23 DIAGNOSIS — M5136 Other intervertebral disc degeneration, lumbar region: Secondary | ICD-10-CM | POA: Diagnosis not present

## 2021-12-23 DIAGNOSIS — M25561 Pain in right knee: Secondary | ICD-10-CM | POA: Diagnosis not present

## 2021-12-23 DIAGNOSIS — M3322 Polymyositis with myopathy: Secondary | ICD-10-CM | POA: Diagnosis not present

## 2021-12-23 DIAGNOSIS — J948 Other specified pleural conditions: Secondary | ICD-10-CM | POA: Diagnosis not present

## 2021-12-23 DIAGNOSIS — E669 Obesity, unspecified: Secondary | ICD-10-CM | POA: Diagnosis not present

## 2021-12-30 DIAGNOSIS — G4700 Insomnia, unspecified: Secondary | ICD-10-CM | POA: Diagnosis not present

## 2021-12-30 DIAGNOSIS — N3281 Overactive bladder: Secondary | ICD-10-CM | POA: Diagnosis not present

## 2021-12-30 DIAGNOSIS — E782 Mixed hyperlipidemia: Secondary | ICD-10-CM | POA: Diagnosis not present

## 2021-12-30 DIAGNOSIS — F064 Anxiety disorder due to known physiological condition: Secondary | ICD-10-CM | POA: Diagnosis not present

## 2021-12-30 DIAGNOSIS — I10 Essential (primary) hypertension: Secondary | ICD-10-CM | POA: Diagnosis not present

## 2022-01-12 NOTE — Progress Notes (Signed)
[place] Phone: [phone] Subjective:   Jocelyn [name], am serving as a scribe for Dr. Hulan Saas.  This visit occurred during the SARS-CoV-2 public health emergency.  Safety protocols were in place, including screening questions prior to the visit, additional usage of staff PPE, and extensive cleaning of exam room while observing appropriate contact time as indicated for disinfecting solutions.    I'm seeing this patient by the request  of:  Patient, [name] Pcp Per (Inactive)  CC: Right knee pain and swelling follow-up  JEH:[id number]  12/09/2021 Discussed with patient at great length.  Patient has been doing relatively well.  Did have an injection a month ago but feels like if she did not fall she would have been doing much better.  We will not make any significant other changes at the moment.  Patient wants to give it another 1 to 2 months to see how patient does.  Worsening pain patient could actually be a candidate for viscosupplementation if needed.  Updated 01/13/2022 Jocelyn [name] is a 66 y.o. female coming in with complaint of R knee pain. Patient states that her knee pain has been worse recently. Pain throughout entire joint. Pain worse with weight bearing. Expresses fear of needles and does not want injections today. Does mention having to put her dog down tomorrow and is worried that knee injections will cause her pain that will make her unable to take her dog into the vet.   Patient would like discuss prescription strength Vit D. Tried to go to the store to get OTC dose and she was overwhelmed with the number of choices.       Past Medical History:  Diagnosis Date   Acute bronchitis    Allergic rhinitis, cause unspecified    Anemia    Anxiety    B12 deficiency    BV (bacterial vaginosis) 06/22/1996   Calculus of kidney    Calculus of kidney    Coronary atherosclerosis of unspecified type of vessel,  native or graft    Dizziness    Essential hypertension, benign    Fibroid 2003   Fibromyalgia    H/O dysmenorrhea 2008   H/O varicella    Headache(784.0)    frequently   HSV-2 infection 2009   Hyperplastic colon polyp 05/16/2014   Irritable bowel syndrome    Meniere's disease, unspecified    Menses, irregular 2003   Myalgia and myositis, unspecified    Myocardial infarction ([place])    Obstructive sleep apnea (adult) (pediatric)    Perimenopausal symptoms 2003   Pure hypercholesterolemia    Sarcoidosis    Type II or unspecified type diabetes mellitus without mention of complication, not stated as uncontrolled    Unspecified venous (peripheral) insufficiency    Vitamin D deficiency    Vulvitis 2010   Yeast infection    Past Surgical History:  Procedure Laterality Date   COLONOSCOPY     CORONARY STENT INTERVENTION N/A 06/15/2017   Procedure: CORONARY STENT INTERVENTION;  Surgeon: Jettie Booze, MD;  Location: [facility];  Service: Cardiovascular;  Laterality: N/A;   heart catherization     LEFT HEART CATH AND CORONARY ANGIOGRAPHY N/A 06/15/2017   Procedure: LEFT HEART CATH AND CORONARY ANGIOGRAPHY;  Surgeon: Jettie Booze, MD;  Location: [place];  Service: Cardiovascular;  Laterality: N/A;   TONSILLECTOMY     Social History   Socioeconomic History   Marital  status: Single    Spouse name: Not on file   Number of children: 1   Years of education: Not on file   Highest education level: Not on file  Occupational History   Occupation: Retired Pharmacist, hospital   Tobacco Use   Smoking status: Never   Smokeless tobacco: Never   Tobacco comments:    Daily Caffeine - 1  Exercise 2-3 times/weekly  Vaping Use   Vaping Use: Never used  Substance and Sexual Activity   Alcohol use: [name]    Alcohol/week: 0.0 standard drinks   Drug use: [name]   Sexual activity: Yes    Birth control/protection: None  Other Topics Concern   Not on file  Social History Narrative    Exercises 2-3 times weekly   Caffeine Use: 1 daily (tea or Pepsi)   Lives alone in a one story home.  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     Social Determinants of Health   Financial Resource Strain: Not on file  Food Insecurity: Not on file  Transportation Needs: Not on file  Physical Activity: Not on file  Stress: Not on file  Social Connections: Not on file   Allergies  Allergen Reactions   Actos [Pioglitazone] Other (See Comments)    REACTION: pt states INTOL w/ edema   Codeine Other (See Comments)    REACTION: vomiting   Januvia [Sitagliptin] Nausea Only   Lactose Intolerance (Gi) Diarrhea   Morphine Nausea Only and Nausea And Vomiting    REACTION: vomiting REACTION: vomiting   Repatha [Evolocumab]     WAS NOT EFFECTIVE IN LOWERING LDL   Family History  Adopted: Yes  Problem Relation Age of Onset   Other Other        ADOPTED   Colon cancer Neg Hx    Esophageal cancer Neg Hx    Rectal cancer Neg Hx    Stomach cancer Neg Hx     Current Outpatient Medications (Endocrine & Metabolic):    Empagliflozin-metFORMIN HCl ER 25-1000 MG TB24, Take 1 tablet by mouth daily.  Current Outpatient Medications (Cardiovascular):    Alirocumab (PRALUENT) 150 MG/ML SOAJ, Inject 1 pen into the skin every 14 (fourteen) days.   Bempedoic Acid (NEXLETOL) 180 MG TABS, Take 1 tablet by mouth daily.   diltiazem (CARDIZEM CD) 240 MG 24 hr capsule, TAKE 1 CAPSULE(240 MG) BY MOUTH DAILY   losartan (COZAAR) 100 MG tablet, TAKE 1 TABLET(100 MG) BY MOUTH DAILY   metoprolol tartrate (LOPRESSOR) 50 MG tablet, TAKE 1 TABLET(50 MG) BY MOUTH TWICE DAILY   nitroGLYCERIN (NITROSTAT) 0.4 MG SL tablet, Place 1 tablet (0.4 mg total) under the tongue every 5 (five) minutes as needed for chest pain.   spironolactone (ALDACTONE) 25 MG tablet, TAKE 1 TABLET(25 MG) BY MOUTH DAILY   VASCEPA 1 g capsule, Take 2 capsules (2 g total) by mouth 2 (two) times daily.   Current Outpatient Medications  (Analgesics):    meloxicam (MOBIC) 7.5 MG tablet, Take 7.5 mg by mouth daily.  Current Outpatient Medications (Hematological):    clopidogrel (PLAVIX) 75 MG tablet, Take 1 tablet (75 mg total) by mouth daily.  Current Outpatient Medications (Other):    methotrexate (RHEUMATREX) 2.5 MG tablet, methotrexate sodium 2.5 mg tablet  take 8 tablets by mouth ONCE A WEEK   ondansetron (ZOFRAN ODT) 8 MG disintegrating tablet, Take 1 tablet (8 mg total) by mouth every 8 (eight) hours as needed for nausea or vomiting.   ondansetron (ZOFRAN) 4 MG tablet, Take  1 tablet (4 mg total) by mouth every 8 (eight) hours as needed for nausea or vomiting.   ONETOUCH VERIO test strip, CHECK BLOOD SUGAR TID AS DIRECTED   pantoprazole (PROTONIX) 40 MG tablet, Take 1 tablet (40 mg total) by mouth daily.   Vitamin D, Ergocalciferol, (DRISDOL) 1.25 MG (50000 UNIT) CAPS capsule, Take 1 capsule (50,000 Units total) by mouth every 7 (seven) days.   Reviewed prior external information including notes and imaging from  primary care provider As well as notes that were available from care everywhere and other healthcare systems.  Past medical history, social, surgical and family history all reviewed in electronic medical record.  [name] pertanent information unless stated regarding to the chief complaint.   Review of Systems:  [name] headache, visual changes, nausea, vomiting, diarrhea, constipation, dizziness, abdominal pain, skin rash, fevers, chills, night sweats, weight loss, swollen lymph nodes, body aches, joint swelling, chest pain, shortness of breath, mood changes. POSITIVE muscle aches  Objective  Blood pressure 118/78, pulse 87, height 5\' 2"  (1.575 m), weight 168 lb (76.2 kg), SpO2 96 %.   General: [name] apparent distress alert and oriented x3 mood and affect normal, dressed appropriately.  HEENT: Pupils equal, extraocular movements intact  Respiratory: Patient's speak in full sentences and does not appear short of breath   Cardiovascular: [name] lower extremity edema, non tender, [name] erythema  Gait antalgic gait noted.  Patient does have tenderness to palpation of the right knee with effusion noted.  Has limited flexion to 95 degrees.  Procedure: Real-time Ultrasound Guided Injection of right knee Device: GE Logiq Q7 Ultrasound guided injection is preferred based studies that show increased duration, increased effect, greater accuracy, decreased procedural pain, increased response rate, and decreased cost with ultrasound guided versus blind injection.  Verbal informed consent obtained.  Time-out conducted.  Noted [name] overlying erythema, induration, or other signs of local infection.  Skin prepped in a sterile fashion.  Local anesthesia: Topical Ethyl chloride.  With sterile technique and under real time ultrasound guidance: With a 22-gauge 2 inch needle patient was injected with 4 cc of 0.5% Marcaine and aspirated 35 cc of straw light-colored fluid with some mild debris and injected with 2 cc of Monovisc 60 mg per 3 mL. Completed without difficulty  Pain immediately resolved suggesting accurate placement of the medication.  Advised to call if fevers/chills, erythema, induration, drainage, or persistent bleeding.  Impression: Technically successful ultrasound guided injection.    Impression and Recommendations:     The above documentation has been reviewed and is accurate and complete Lyndal Pulley, DO

## 2022-01-13 ENCOUNTER — Other Ambulatory Visit: Payer: Self-pay

## 2022-01-13 ENCOUNTER — Encounter: Payer: Self-pay | Admitting: Family Medicine

## 2022-01-13 ENCOUNTER — Ambulatory Visit: Payer: Medicare PPO | Admitting: Family Medicine

## 2022-01-13 ENCOUNTER — Ambulatory Visit: Payer: Self-pay

## 2022-01-13 VITALS — BP 118/78 | HR 87 | Ht 62.0 in | Wt 168.0 lb

## 2022-01-13 DIAGNOSIS — M1711 Unilateral primary osteoarthritis, right knee: Secondary | ICD-10-CM | POA: Diagnosis not present

## 2022-01-13 DIAGNOSIS — M2550 Pain in unspecified joint: Secondary | ICD-10-CM | POA: Diagnosis not present

## 2022-01-13 DIAGNOSIS — M25561 Pain in right knee: Secondary | ICD-10-CM

## 2022-01-13 MED ORDER — VITAMIN D (ERGOCALCIFEROL) 1.25 MG (50000 UNIT) PO CAPS: 50000.0000 [IU] | ORAL_CAPSULE | ORAL | 0 refills | Status: DC

## 2022-01-13 NOTE — Patient Instructions (Addendum)
Rx Vit D take once weekly ?Injected Monovisc in knee today ?See me again in 6-8 weeks ? ?

## 2022-01-13 NOTE — Assessment & Plan Note (Signed)
Patient given viscosupplementation today.  Could always go back to going to steroid but patient does have some underlying arthritis.  Hopefully this does make a big difference for patient.  Patient likes to avoid too many steroids secondary to her diabetes.  Discussed icing regimen and home exercises.  Discussed the importance of weight loss.  Follow-up again in 6 to 8 weeks. ?

## 2022-01-14 LAB — SYNOVIAL FLUID ANALYSIS, COMPLETE
Basophils, %: 0 %
Eosinophils-Synovial: 0 % (ref 0–2)
Lymphocytes-Synovial Fld: 72 % (ref 0–74)
Monocyte/Macrophage: 17 % (ref 0–69)
Neutrophil, Synovial: 11 % (ref 0–24)
Synoviocytes, %: 0 % (ref 0–15)
WBC, Synovial: 192 cells/uL — ABNORMAL HIGH (ref <150)

## 2022-02-07 NOTE — Progress Notes (Signed)
?  ?Cardiology Office Note ? ? ?Date:  02/08/2022  ? ?ID:  Jocelyn [name], DOB 28-Sep-1956, MRN [id number] ? ?PCP:  Patient, No Pcp Per (Inactive)  ? ? ?Chief Complaint  ?Patient presents with  ? Follow-up  ? ?CAD ? ?Wt Readings from Last 3 Encounters:  ?02/08/22 167 lb (75.8 kg)  ?01/13/22 168 lb (76.2 kg)  ?12/09/21 165 lb (74.8 kg)  ?  ? ?  ?History of Present Illness: ?Jocelyn [name] is a 66 y.o. female   With CAD.  Cath many years ago Showed branch vessel disease. She has had difficulty tolerating statins. Eventually, she was put on Repatha. ?  ?She had an inferior STEMI on 06/15/2017. She received a drug-eluting stent to her distal right coronary artery. She had mild LAD disease. Her diagonal vessel disease has persisted over the years. Of note, she had severe radial spasm and it was noted that she had subclavian artery lusoria variant which made right radial approach quite difficult. This anatomic abnormality had been noted on a prior CT scan done at [facility].  She had arm pain, heartburn, back pain when she had her MI.   ?  ?Echo in 8/18 showed: ?Left ventricle: The cavity size was normal. There was mild ?  concentric hypertrophy. Systolic function was normal. The ?  estimated ejection fraction was in the range of 50% to 55%. Wall ?  motion was normal; there were no regional wall motion ?  abnormalities. Doppler parameters are consistent with abnormal ?  left ventricular relaxation (grade 1 diastolic dysfunction). ?- Aortic valve: There was no regurgitation. ?- Mitral valve: There was mild regurgitation. ?- Right ventricle: The cavity size was normal. Wall thickness was ?  normal. Systolic function was normal. ?- Atrial septum: No defect or patent foramen ovale was identified ?  by color flow Doppler. ?- Tricuspid valve: There was no regurgitation. ?  ?She has had issues with HTN and headaches.  THis seems to have resolved. ?  ?She had a pneumonia and pleural effusion in 10/18.  It was bloody.  Her  Brilinta was stopped at that time for thoracentesis. ?  ?Knee pain limits exercise. No prolonged walking due to knee.  Working at [employer].  Walks inside [employer]. ?  ?In 2020,  She wore a BP monitor 24 hours, avg BP 138/79.  ?  ?She had COVID in June.  Sx lasted about three weeks.  She took antiviral.   ?  ?In the past, she noted: "Some chest pain with stress, but different type of sx than MI.  She has DOE." ? ?Denies : Chest pain. Dizziness. Leg edema. Nitroglycerin use. Orthopnea. Palpitations. Paroxysmal nocturnal dyspnea. Shortness of breath. Syncope.    ? ?Walking limited by knee pain.  ? ? ?Past Medical History:  ?Diagnosis Date  ? Acute bronchitis   ? Allergic rhinitis, cause unspecified   ? Anemia   ? Anxiety   ? B12 deficiency   ? BV (bacterial vaginosis) 06/22/1996  ? Calculus of kidney   ? Calculus of kidney   ? Coronary atherosclerosis of unspecified type of vessel, native or graft   ? Dizziness   ? Essential hypertension, benign   ? Fibroid 2003  ? Fibromyalgia   ? H/O dysmenorrhea 2008  ? H/O varicella   ? Headache(784.0)   ? frequently  ? HSV-2 infection 2009  ? Hyperplastic colon polyp 05/16/2014  ? Irritable bowel syndrome   ? Meniere's disease, unspecified   ?  Menses, irregular 2003  ? Myalgia and myositis, unspecified   ? Myocardial infarction [facility])   ? Obstructive sleep apnea (adult) (pediatric)   ? Perimenopausal symptoms 2003  ? Pure hypercholesterolemia   ? Sarcoidosis   ? Type II or unspecified type diabetes mellitus without mention of complication, not stated as uncontrolled   ? Unspecified venous (peripheral) insufficiency   ? Vitamin D deficiency   ? Vulvitis 2010  ? Yeast infection   ? ? ?Past Surgical History:  ?Procedure Laterality Date  ? COLONOSCOPY    ? CORONARY STENT INTERVENTION N/A 06/15/2017  ? Procedure: CORONARY STENT INTERVENTION;  Surgeon: Jettie Booze, MD;  Location: [place] [facility];  Service: Cardiovascular;  Laterality: N/A;  ? heart catherization    ?  LEFT HEART CATH AND CORONARY ANGIOGRAPHY N/A 06/15/2017  ? Procedure: LEFT HEART CATH AND CORONARY ANGIOGRAPHY;  Surgeon: Jettie Booze, MD;  Location: [place] [facility];  Service: Cardiovascular;  Laterality: N/A;  ? TONSILLECTOMY    ? ? ? ?Current Outpatient Medications  ?Medication Sig Dispense Refill  ? Alirocumab (PRALUENT) 150 MG/ML SOAJ Inject 1 pen into the skin every 14 (fourteen) days. 6 mL 3  ? allopurinol (ZYLOPRIM) 100 MG tablet Take 1 tablet by mouth daily as needed.    ? ALPRAZolam (XANAX) 0.25 MG tablet Take 1 tablet by mouth daily as needed.    ? azaTHIOprine (IMURAN) 50 MG tablet Take 1 tablet by mouth 2 (two) times daily.    ? Bempedoic Acid (NEXLETOL) 180 MG TABS Take 1 tablet by mouth daily. 90 tablet 3  ? clopidogrel (PLAVIX) 75 MG tablet Take 1 tablet (75 mg total) by mouth daily. 90 tablet 3  ? clopidogrel (PLAVIX) 75 MG tablet Take 1 tablet by mouth daily.    ? diltiazem (CARDIZEM CD) 240 MG 24 hr capsule TAKE 1 CAPSULE(240 MG) BY MOUTH DAILY 90 capsule 3  ? Empagliflozin-metFORMIN HCl ER 25-1000 MG TB24 Take 1 tablet by mouth daily.    ? GEMTESA 75 MG TABS Take 1 tablet by mouth daily.    ? losartan (COZAAR) 100 MG tablet TAKE 1 TABLET(100 MG) BY MOUTH DAILY 90 tablet 3  ? metoprolol tartrate (LOPRESSOR) 50 MG tablet TAKE 1 TABLET(50 MG) BY MOUTH TWICE DAILY 180 tablet 3  ? nitroGLYCERIN (NITROSTAT) 0.4 MG SL tablet Place 1 tablet (0.4 mg total) under the tongue every 5 (five) minutes as needed for chest pain. 25 tablet 2  ? ONETOUCH VERIO test strip CHECK BLOOD SUGAR TID AS DIRECTED    ? spironolactone (ALDACTONE) 25 MG tablet TAKE 1 TABLET(25 MG) BY MOUTH DAILY 90 tablet 3  ? TRULICITY 0.98 JX/9.1YN SOPN Inject 1 mL into the skin once a week.    ? valACYclovir (VALTREX) 500 MG tablet Take 1 tablet by mouth daily as needed.    ? VASCEPA 1 g capsule Take 2 capsules (2 g total) by mouth 2 (two) times daily. 360 capsule 3  ? Vitamin D, Ergocalciferol, (DRISDOL) 1.25 MG (50000 UNIT) CAPS  capsule Take 1 capsule (50,000 Units total) by mouth every 7 (seven) days. 12 capsule 0  ? ?No current facility-administered medications for this visit.  ? ? ?Allergies:   Actos [pioglitazone], Codeine, Januvia [sitagliptin], Lactose intolerance (gi), Morphine, and Repatha [evolocumab]  ? ? ?Social History:  The patient  reports that she has never smoked. She has never used smokeless tobacco. She reports that she does not drink alcohol and does not use drugs.  ? ?  Family History:  The patient's family history includes Other in an other family member. She was adopted.  ? ? ?ROS:  Please see the history of present illness.   Otherwise, review of systems are positive for intentional weight loss.   All other systems are reviewed and negative.  ? ? ?PHYSICAL EXAM: ?VS:  BP 130/80   Pulse 74   Ht '5\' 2"'$  (1.575 m)   Wt 167 lb (75.8 kg)   SpO2 97%   BMI 30.54 kg/m?  , BMI Body mass index is 30.54 kg/m?. ?GEN: Well nourished, well developed, in no acute distress ?HEENT: normal ?Neck: no JVD, carotid bruits, or masses ?Cardiac: RRR; no murmurs, rubs, or gallops,no edema  ?Respiratory:  clear to auscultation bilaterally, normal work of breathing ?GI: soft, nontender, nondistended, + BS ?MS: no deformity or atrophy ?Skin: warm and dry, no rash ?Neuro:  Strength and sensation are intact ?Psych: euthymic mood, full affect ? ? ?EKG:   ?The ekg ordered today demonstrates NSR, nonspecific ST segment changes ? ? ?Recent Labs: ?No results found for requested labs within last 8760 hours.  ? ?Lipid Panel ?   ?Component Value Date/Time  ? CHOL 149 08/22/2020 1609  ? TRIG 113 08/22/2020 1609  ? HDL 37 (L) 08/22/2020 1609  ? CHOLHDL 4.0 08/22/2020 1609  ? CHOLHDL 4 05/30/2018 1444  ? VLDL 36.2 05/30/2018 1444  ? [place] 91 08/22/2020 1609  ? LDLDIRECT 91.6 08/14/2014 0822  ? ?  ?Other studies Reviewed: ?Additional studies/ records that were reviewed today with results demonstrating: Cholesterol 155, HDL 50, LDL 76, triglycerides 192 in  September 2022. ? ? ?ASSESSMENT AND PLAN: ? ?CAD/old MI: No angina.  Continue aggressive secondary prevention.  Post PCI of the RCA in the setting of acute inferior ST elevation MI back in 2018. ?Hyperlipidemia

## 2022-02-08 ENCOUNTER — Encounter: Payer: Self-pay | Admitting: Interventional Cardiology

## 2022-02-08 ENCOUNTER — Ambulatory Visit: Payer: Medicare PPO | Admitting: Interventional Cardiology

## 2022-02-08 ENCOUNTER — Other Ambulatory Visit: Payer: Self-pay

## 2022-02-08 VITALS — BP 130/80 | HR 74 | Ht 62.0 in | Wt 167.0 lb

## 2022-02-08 DIAGNOSIS — E1159 Type 2 diabetes mellitus with other circulatory complications: Secondary | ICD-10-CM | POA: Diagnosis not present

## 2022-02-08 DIAGNOSIS — I10 Essential (primary) hypertension: Secondary | ICD-10-CM

## 2022-02-08 DIAGNOSIS — E782 Mixed hyperlipidemia: Secondary | ICD-10-CM

## 2022-02-08 DIAGNOSIS — I252 Old myocardial infarction: Secondary | ICD-10-CM | POA: Diagnosis not present

## 2022-02-08 DIAGNOSIS — I25118 Atherosclerotic heart disease of native coronary artery with other forms of angina pectoris: Secondary | ICD-10-CM | POA: Diagnosis not present

## 2022-02-08 MED ORDER — NITROGLYCERIN 0.4 MG SL SUBL: 0.4000 mg | SUBLINGUAL_TABLET | SUBLINGUAL | 2 refills | Status: DC | PRN

## 2022-02-08 NOTE — Patient Instructions (Signed)
Medication Instructions:  ?Your physician recommends that you continue on your current medications as directed. Please refer to the Current Medication list given to you today. ? ?*If you need a refill on your cardiac medications before your next appointment, please call your pharmacy* ? ? ?Lab Work: ?none ?If you have labs (blood work) drawn today and your tests are completely normal, you will receive your results only by: ?MyChart Message (if you have MyChart) OR ?A paper copy in the mail ?If you have any lab test that is abnormal or we need to change your treatment, we will call you to review the results. ? ? ?Testing/Procedures: ?none ? ? ?Follow-Up: ?At [facility], you and your health needs are our priority.  As part of our continuing mission to provide you with exceptional heart care, we have created designated Provider Care Teams.  These Care Teams include your primary Cardiologist (physician) and Advanced Practice Providers (APPs -  Physician Assistants and Nurse Practitioners) who all work together to provide you with the care you need, when you need it. ? ?We recommend signing up for the patient portal called "MyChart".  Sign up information is provided on this After Visit Summary.  MyChart is used to connect with patients for Virtual Visits (Telemedicine).  Patients are able to view lab/test results, encounter notes, upcoming appointments, etc.  Non-urgent messages can be sent to your provider as well.   ?To learn more about what you can do with MyChart, go to [email or web address].   ? ?Your next appointment:   ? About 8 month(s)--November/December 2023 ? ?The format for your next appointment:   ?In Person ? ?Provider:   ?Larae Grooms, MD   ? ? ?Other Instructions ?  ? ?

## 2022-02-11 DIAGNOSIS — R531 Weakness: Secondary | ICD-10-CM | POA: Diagnosis not present

## 2022-02-11 DIAGNOSIS — R262 Difficulty in walking, not elsewhere classified: Secondary | ICD-10-CM | POA: Diagnosis not present

## 2022-02-17 DIAGNOSIS — R531 Weakness: Secondary | ICD-10-CM | POA: Diagnosis not present

## 2022-02-17 DIAGNOSIS — R262 Difficulty in walking, not elsewhere classified: Secondary | ICD-10-CM | POA: Diagnosis not present

## 2022-02-17 DIAGNOSIS — Z1231 Encounter for screening mammogram for malignant neoplasm of breast: Secondary | ICD-10-CM | POA: Diagnosis not present

## 2022-02-24 DIAGNOSIS — R531 Weakness: Secondary | ICD-10-CM | POA: Diagnosis not present

## 2022-02-24 DIAGNOSIS — R262 Difficulty in walking, not elsewhere classified: Secondary | ICD-10-CM | POA: Diagnosis not present

## 2022-03-03 DIAGNOSIS — R262 Difficulty in walking, not elsewhere classified: Secondary | ICD-10-CM | POA: Diagnosis not present

## 2022-03-03 DIAGNOSIS — R531 Weakness: Secondary | ICD-10-CM | POA: Diagnosis not present

## 2022-03-09 NOTE — Progress Notes (Signed)
?Charlann Boxer D.O. ?[facility] ?[place] ?Phone: [phone] ?Subjective:   ?I, Jocelyn [name], am serving as a scribe for Dr. Hulan Saas. ? ?This visit occurred during the SARS-CoV-2 public health emergency.  Safety protocols were in place, including screening questions prior to the visit, additional usage of staff PPE, and extensive cleaning of exam room while observing appropriate contact time as indicated for disinfecting solutions.  ? ? ?I'm seeing this patient by the request  of:  Patient, No Pcp Per (Inactive) ? ?CC: right knee pain  ? ?ACZ:[id number]  ?01/13/2022 ?Patient given viscosupplementation today.  Could always go back to going to steroid but patient does have some underlying arthritis.  Hopefully this does make a big difference for patient.  Patient likes to avoid too many steroids secondary to her diabetes.  Discussed icing regimen and home exercises.  Discussed the importance of weight loss.  Follow-up again in 6 to 8 weeks. ? ?Updated 03/10/2022 ?Jocelyn [name] is a 66 y.o. female coming in with complaint of R knee pain. Patient states that she did not have much relief from the gel injection. Pain at night and with weight bearing.  ? ? ?  ? ?Past Medical History:  ?Diagnosis Date  ? Acute bronchitis   ? Allergic rhinitis, cause unspecified   ? Anemia   ? Anxiety   ? B12 deficiency   ? BV (bacterial vaginosis) 06/22/1996  ? Calculus of kidney   ? Calculus of kidney   ? Coronary atherosclerosis of unspecified type of vessel, native or graft   ? Dizziness   ? Essential hypertension, benign   ? Fibroid 2003  ? Fibromyalgia   ? H/O dysmenorrhea 2008  ? H/O varicella   ? Headache(784.0)   ? frequently  ? HSV-2 infection 2009  ? Hyperplastic colon polyp 05/16/2014  ? Irritable bowel syndrome   ? Meniere's disease, unspecified   ? Menses, irregular 2003  ? Myalgia and myositis, unspecified   ? Myocardial infarction [facility])   ? Obstructive sleep apnea (adult)  (pediatric)   ? Perimenopausal symptoms 2003  ? Pure hypercholesterolemia   ? Sarcoidosis   ? Type II or unspecified type diabetes mellitus without mention of complication, not stated as uncontrolled   ? Unspecified venous (peripheral) insufficiency   ? Vitamin D deficiency   ? Vulvitis 2010  ? Yeast infection   ? ?Past Surgical History:  ?Procedure Laterality Date  ? COLONOSCOPY    ? CORONARY STENT INTERVENTION N/A 06/15/2017  ? Procedure: CORONARY STENT INTERVENTION;  Surgeon: Jettie Booze, MD;  Location: [place];  Service: Cardiovascular;  Laterality: N/A;  ? heart catherization    ? LEFT HEART CATH AND CORONARY ANGIOGRAPHY N/A 06/15/2017  ? Procedure: LEFT HEART CATH AND CORONARY ANGIOGRAPHY;  Surgeon: Jettie Booze, MD;  Location: [place] [facility];  Service: Cardiovascular;  Laterality: N/A;  ? TONSILLECTOMY    ? ?Social History  ? ?Socioeconomic History  ? Marital status: Single  ?  Spouse name: Not on file  ? Number of children: 1  ? Years of education: Not on file  ? Highest education level: Not on file  ?Occupational History  ? Occupation: Retired Pharmacist, hospital   ?Tobacco Use  ? Smoking status: Never  ? Smokeless tobacco: Never  ? Tobacco comments:  ?  Daily Caffeine - 1  Exercise 2-3 times/weekly  ?Vaping Use  ? Vaping Use: Never used  ?Substance and Sexual Activity  ?  Alcohol use: No  ?  Alcohol/week: 0.0 standard drinks  ? Drug use: No  ? Sexual activity: Yes  ?  Birth control/protection: None  ?Other Topics Concern  ? Not on file  ?Social History Narrative  ? Exercises 2-3 times weekly  ? Caffeine Use: 1 daily (tea or Pepsi)  ? Lives alone in a one story home.  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    ? ?Social Determinants of Health  ? ?Financial Resource Strain: Not on file  ?Food Insecurity: Not on file  ?Transportation Needs: Not on file  ?Physical Activity: Not on file  ?Stress: Not on file  ?Social Connections: Not on file  ? ?Allergies  ?Allergen Reactions  ? Actos  [Pioglitazone] Other (See Comments)  ?  REACTION: pt states INTOL w/ edema  ? Codeine Other (See Comments)  ?  REACTION: vomiting  ? Januvia [Sitagliptin] Nausea Only  ? Lactose Intolerance (Gi) Diarrhea  ? Morphine Nausea Only and Nausea And Vomiting  ?  REACTION: vomiting ?REACTION: vomiting  ? Repatha [Evolocumab]   ?  WAS NOT EFFECTIVE IN LOWERING LDL  ? ?Family History  ?Adopted: Yes  ?Problem Relation Age of Onset  ? Other Other   ?     ADOPTED  ? Colon cancer Neg Hx   ? Esophageal cancer Neg Hx   ? Rectal cancer Neg Hx   ? Stomach cancer Neg Hx   ? ? ?Current Outpatient Medications (Endocrine & Metabolic):  ?  Empagliflozin-metFORMIN HCl ER 25-1000 MG TB24, Take 1 tablet by mouth daily. ?  TRULICITY 2.63 ZC/5.8IF SOPN, Inject 1 mL into the skin once a week. ? ?Current Outpatient Medications (Cardiovascular):  ?  Alirocumab (PRALUENT) 150 MG/ML SOAJ, Inject 1 pen into the skin every 14 (fourteen) days. ?  Bempedoic Acid (NEXLETOL) 180 MG TABS, Take 1 tablet by mouth daily. ?  diltiazem (CARDIZEM CD) 240 MG 24 hr capsule, TAKE 1 CAPSULE(240 MG) BY MOUTH DAILY ?  losartan (COZAAR) 100 MG tablet, TAKE 1 TABLET(100 MG) BY MOUTH DAILY ?  metoprolol tartrate (LOPRESSOR) 50 MG tablet, TAKE 1 TABLET(50 MG) BY MOUTH TWICE DAILY ?  nitroGLYCERIN (NITROSTAT) 0.4 MG SL tablet, Place 1 tablet (0.4 mg total) under the tongue every 5 (five) minutes as needed for chest pain. ?  spironolactone (ALDACTONE) 25 MG tablet, TAKE 1 TABLET(25 MG) BY MOUTH DAILY ?  VASCEPA 1 g capsule, Take 2 capsules (2 g total) by mouth 2 (two) times daily. ? ? ?Current Outpatient Medications (Analgesics):  ?  allopurinol (ZYLOPRIM) 100 MG tablet, Take 1 tablet by mouth daily as needed. ? ?Current Outpatient Medications (Hematological):  ?  clopidogrel (PLAVIX) 75 MG tablet, Take 1 tablet (75 mg total) by mouth daily. ?  clopidogrel (PLAVIX) 75 MG tablet, Take 1 tablet by mouth daily. ? ?Current Outpatient Medications (Other):  ?  ALPRAZolam (XANAX)  0.25 MG tablet, Take 1 tablet by mouth daily as needed. ?  azaTHIOprine (IMURAN) 50 MG tablet, Take 1 tablet by mouth 2 (two) times daily. ?  GEMTESA 75 MG TABS, Take 1 tablet by mouth daily. ?  ONETOUCH VERIO test strip, CHECK BLOOD SUGAR TID AS DIRECTED ?  valACYclovir (VALTREX) 500 MG tablet, Take 1 tablet by mouth daily as needed. ?  Vitamin D, Ergocalciferol, (DRISDOL) 1.25 MG (50000 UNIT) CAPS capsule, Take 1 capsule (50,000 Units total) by mouth every 7 (seven) days. ? ? ?Reviewed prior external information including notes and imaging from  ?primary care provider ?As well  as notes that were available from care everywhere and other healthcare systems. ? ?Past medical history, social, surgical and family history all reviewed in electronic medical record.  No pertanent information unless stated regarding to the chief complaint.  ? ?Review of Systems: ? No headache, visual changes, nausea, vomiting, diarrhea, constipation, dizziness, abdominal pain, skin rash, fevers, chills, night sweats, weight loss, swollen lymph nodes, body aches, chest pain, shortness of breath, mood changes. POSITIVE muscle aches, joint swelling  ? ?Objective  ?Blood pressure 120/88, pulse 79, height '5\' 2"'$  (1.575 m), weight 164 lb (74.4 kg), SpO2 93 %. ?  ?General: No apparent distress alert and oriented x3 mood and affect normal, dressed appropriately.  ?HEENT: Pupils equal, extraocular movements intact  ?Respiratory: Patient's speak in full sentences and does not appear short of breath  ?Cardiovascular: No lower extremity edema, non tender, no erythema  ?Gait normal with good balance and coordination.  ?MSK: increase effusion noted with positive grind and mcmurry  limit flexion of 15 degrees of flexion  ? ? ?  ?Impression and Recommendations:  ?  ? ?The above documentation has been reviewed and is accurate and complete Lyndal Pulley, DO ? ? ? ?

## 2022-03-10 ENCOUNTER — Ambulatory Visit: Payer: Medicare PPO | Admitting: Family Medicine

## 2022-03-10 ENCOUNTER — Encounter: Payer: Self-pay | Admitting: Family Medicine

## 2022-03-10 VITALS — BP 120/88 | HR 79 | Ht 62.0 in | Wt 164.0 lb

## 2022-03-10 DIAGNOSIS — R262 Difficulty in walking, not elsewhere classified: Secondary | ICD-10-CM | POA: Diagnosis not present

## 2022-03-10 DIAGNOSIS — G8929 Other chronic pain: Secondary | ICD-10-CM

## 2022-03-10 DIAGNOSIS — M1711 Unilateral primary osteoarthritis, right knee: Secondary | ICD-10-CM | POA: Diagnosis not present

## 2022-03-10 DIAGNOSIS — M25561 Pain in right knee: Secondary | ICD-10-CM

## 2022-03-10 DIAGNOSIS — R531 Weakness: Secondary | ICD-10-CM | POA: Diagnosis not present

## 2022-03-10 NOTE — Patient Instructions (Signed)
MRI R knee [id number] ?We will be in touch with results ?

## 2022-03-11 NOTE — Assessment & Plan Note (Signed)
No improvement  ?Worsening swelling and pain and mild instability  ?Discussed with worsening need advance imaging with xrays not showing severe arthritis,  ?F/u after imaging.  ?

## 2022-03-23 DIAGNOSIS — M3322 Polymyositis with myopathy: Secondary | ICD-10-CM | POA: Diagnosis not present

## 2022-03-24 DIAGNOSIS — R531 Weakness: Secondary | ICD-10-CM | POA: Diagnosis not present

## 2022-03-24 DIAGNOSIS — R262 Difficulty in walking, not elsewhere classified: Secondary | ICD-10-CM | POA: Diagnosis not present

## 2022-03-31 DIAGNOSIS — R262 Difficulty in walking, not elsewhere classified: Secondary | ICD-10-CM | POA: Diagnosis not present

## 2022-03-31 DIAGNOSIS — R531 Weakness: Secondary | ICD-10-CM | POA: Diagnosis not present

## 2022-04-02 ENCOUNTER — Other Ambulatory Visit: Payer: Self-pay

## 2022-04-02 MED ORDER — METOPROLOL TARTRATE 50 MG PO TABS: ORAL_TABLET | ORAL | 3 refills | Status: DC

## 2022-04-03 ENCOUNTER — Ambulatory Visit
Admission: RE | Admit: 2022-04-03 | Discharge: 2022-04-03 | Disposition: A | Discharge status: Home / Self Care | Payer: Medicare PPO | Source: Ambulatory Visit | Attending: Family Medicine | Admitting: Family Medicine

## 2022-04-03 DIAGNOSIS — S83241A Other tear of medial meniscus, current injury, right knee, initial encounter: Secondary | ICD-10-CM | POA: Diagnosis not present

## 2022-04-03 DIAGNOSIS — G8929 Other chronic pain: Secondary | ICD-10-CM | POA: Diagnosis not present

## 2022-04-03 DIAGNOSIS — M7121 Synovial cyst of popliteal space [Baker], right knee: Secondary | ICD-10-CM | POA: Diagnosis not present

## 2022-04-03 DIAGNOSIS — M25461 Effusion, right knee: Secondary | ICD-10-CM | POA: Diagnosis not present

## 2022-04-07 DIAGNOSIS — R262 Difficulty in walking, not elsewhere classified: Secondary | ICD-10-CM | POA: Diagnosis not present

## 2022-04-07 DIAGNOSIS — R531 Weakness: Secondary | ICD-10-CM | POA: Diagnosis not present

## 2022-04-14 DIAGNOSIS — R262 Difficulty in walking, not elsewhere classified: Secondary | ICD-10-CM | POA: Diagnosis not present

## 2022-04-14 DIAGNOSIS — R531 Weakness: Secondary | ICD-10-CM | POA: Diagnosis not present

## 2022-04-28 DIAGNOSIS — I10 Essential (primary) hypertension: Secondary | ICD-10-CM | POA: Diagnosis not present

## 2022-04-28 DIAGNOSIS — M3320 Polymyositis, organ involvement unspecified: Secondary | ICD-10-CM | POA: Diagnosis not present

## 2022-04-28 DIAGNOSIS — E1169 Type 2 diabetes mellitus with other specified complication: Secondary | ICD-10-CM | POA: Diagnosis not present

## 2022-04-28 DIAGNOSIS — E785 Hyperlipidemia, unspecified: Secondary | ICD-10-CM | POA: Diagnosis not present

## 2022-04-28 DIAGNOSIS — M1010 Lead-induced gout, unspecified site: Secondary | ICD-10-CM | POA: Diagnosis not present

## 2022-04-28 DIAGNOSIS — F064 Anxiety disorder due to known physiological condition: Secondary | ICD-10-CM | POA: Diagnosis not present

## 2022-04-28 DIAGNOSIS — F330 Major depressive disorder, recurrent, mild: Secondary | ICD-10-CM | POA: Diagnosis not present

## 2022-04-28 NOTE — Progress Notes (Signed)
[place] Phone: [phone] Subjective:   Jocelyn Jocelyn [name], am serving as a scribe for Dr. Hulan Saas.  I'm seeing this patient by the request  of:  Patient, Jocelyn [name] Pcp Per  CC: Knee pain follow-up  KCL:[id number]  03/10/2022 Jocelyn [name] improvement  Worsening swelling and pain and mild instability  Discussed with worsening need advance imaging with xrays not showing severe arthritis,  F/u after imaging.   Updated 04/29/2022 Jocelyn Jocelyn [name] is a 66 y.o. female coming in with complaint of R knee pain. Painful to walk.  Patient states that it is affecting all daily activities at this time.  Wants to know what she can possibly do.  MRI IMPRESSION: 1. Moderate to high-grade medial compartment and mild-to-moderate patellofemoral and lateral compartment osteoarthritis. 2. Multiple degenerative tears of the medial meniscus, greatest within the body of the medial meniscus. 3. Degenerative tears of the posterior horn of the lateral meniscus near the root. 4. Moderate joint effusion and tiny Baker's cyst.     Past Medical History:  Diagnosis Date   Acute bronchitis    Allergic rhinitis, cause unspecified    Anemia    Anxiety    B12 deficiency    BV (bacterial vaginosis) 06/22/1996   Calculus of kidney    Calculus of kidney    Coronary atherosclerosis of unspecified type of vessel, native or graft    Dizziness    Essential hypertension, benign    Fibroid 2003   Fibromyalgia    H/O dysmenorrhea 2008   H/O varicella    Headache(784.0)    frequently   HSV-2 infection 2009   Hyperplastic colon polyp 05/16/2014   Irritable bowel syndrome    Meniere's disease, unspecified    Menses, irregular 2003   Myalgia and myositis, unspecified    Myocardial infarction ([place])    Obstructive sleep apnea (adult) (pediatric)    Perimenopausal symptoms 2003   Pure hypercholesterolemia    Sarcoidosis    Type II or unspecified type  diabetes mellitus without mention of complication, not stated as uncontrolled    Unspecified venous (peripheral) insufficiency    Vitamin D deficiency    Vulvitis 2010   Yeast infection    Past Surgical History:  Procedure Laterality Date   COLONOSCOPY     CORONARY STENT INTERVENTION N/A 06/15/2017   Procedure: CORONARY STENT INTERVENTION;  Surgeon: Jettie Booze, MD;  Location: [facility];  Service: Cardiovascular;  Laterality: N/A;   heart catherization     LEFT HEART CATH AND CORONARY ANGIOGRAPHY N/A 06/15/2017   Procedure: LEFT HEART CATH AND CORONARY ANGIOGRAPHY;  Surgeon: Jettie Booze, MD;  Location: [facility];  Service: Cardiovascular;  Laterality: N/A;   TONSILLECTOMY     Social History   Socioeconomic History   Marital status: Single    Spouse name: Not on file   Number of children: 1   Years of education: Not on file   Highest education level: Not on file  Occupational History   Occupation: Retired Pharmacist, hospital   Tobacco Use   Smoking status: Never   Smokeless tobacco: Never   Tobacco comments:    Daily Caffeine - 1  Exercise 2-3 times/weekly  Vaping Use   Vaping Use: Never used  Substance and Sexual Activity   Alcohol use: Jocelyn [name]    Alcohol/week: 0.0 standard drinks of alcohol   Drug use: Jocelyn [name]   Sexual activity: Yes    Birth  control/protection: None  Other Topics Concern   Not on file  Social History Narrative   Exercises 2-3 times weekly   Caffeine Use: 1 daily (tea or Pepsi)   Lives alone in a one story home.  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     Social Determinants of Health   Financial Resource Strain: Not on file  Food Insecurity: Not on file  Transportation Needs: Not on file  Physical Activity: Not on file  Stress: Not on file  Social Connections: Not on file   Allergies  Allergen Reactions   Actos [Pioglitazone] Other (See Comments)    REACTION: pt states INTOL w/ edema   Codeine Other (See Comments)    REACTION:  vomiting   Januvia [Sitagliptin] Nausea Only   Lactose Intolerance (Gi) Diarrhea   Morphine Nausea Only and Nausea And Vomiting    REACTION: vomiting REACTION: vomiting   Repatha [Evolocumab]     WAS NOT EFFECTIVE IN LOWERING LDL   Family History  Adopted: Yes  Problem Relation Age of Onset   Other Other        ADOPTED   Colon cancer Neg Hx    Esophageal cancer Neg Hx    Rectal cancer Neg Hx    Stomach cancer Neg Hx     Current Outpatient Medications (Endocrine & Metabolic):    Empagliflozin-metFORMIN HCl ER 25-1000 MG TB24, Take 1 tablet by mouth daily.   TRULICITY 9.48 NI/6.2VO SOPN, Inject 1 mL into the skin once a week.  Current Outpatient Medications (Cardiovascular):    Alirocumab (PRALUENT) 150 MG/ML SOAJ, Inject 1 pen into the skin every 14 (fourteen) days.   Bempedoic Acid (NEXLETOL) 180 MG TABS, Take 1 tablet by mouth daily.   diltiazem (CARDIZEM CD) 240 MG 24 hr capsule, TAKE 1 CAPSULE(240 MG) BY MOUTH DAILY   losartan (COZAAR) 100 MG tablet, TAKE 1 TABLET(100 MG) BY MOUTH DAILY   metoprolol tartrate (LOPRESSOR) 50 MG tablet, TAKE 1 TABLET(50 MG) BY MOUTH TWICE DAILY   nitroGLYCERIN (NITROSTAT) 0.4 MG SL tablet, Place 1 tablet (0.4 mg total) under the tongue every 5 (five) minutes as needed for chest pain.   spironolactone (ALDACTONE) 25 MG tablet, TAKE 1 TABLET(25 MG) BY MOUTH DAILY   VASCEPA 1 g capsule, Take 2 capsules (2 g total) by mouth 2 (two) times daily.   Current Outpatient Medications (Analgesics):    allopurinol (ZYLOPRIM) 100 MG tablet, Take 1 tablet by mouth daily as needed.   meloxicam (MOBIC) 7.5 MG tablet, Take 1 tablet (7.5 mg total) by mouth daily.  Current Outpatient Medications (Hematological):    clopidogrel (PLAVIX) 75 MG tablet, Take 1 tablet (75 mg total) by mouth daily.   clopidogrel (PLAVIX) 75 MG tablet, Take 1 tablet by mouth daily.  Current Outpatient Medications (Other):    ALPRAZolam (XANAX) 0.25 MG tablet, Take 1 tablet by mouth  daily as needed.   azaTHIOprine (IMURAN) 50 MG tablet, Take 1 tablet by mouth 2 (two) times daily.   GEMTESA 75 MG TABS, Take 1 tablet by mouth daily.   ONETOUCH VERIO test strip, CHECK BLOOD SUGAR TID AS DIRECTED   valACYclovir (VALTREX) 500 MG tablet, Take 1 tablet by mouth daily as needed.   Vitamin D, Ergocalciferol, (DRISDOL) 1.25 MG (50000 UNIT) CAPS capsule, Take 1 capsule (50,000 Units total) by mouth every 7 (seven) days.     Review of Systems:  Jocelyn [name] headache, visual changes, nausea, vomiting, diarrhea, constipation, dizziness, abdominal pain, skin rash, fevers, chills, night sweats,  weight loss, swollen lymph nodes, body aches,  chest pain, shortness of breath, mood changes. POSITIVE muscle aches, joint swelling  Objective  Blood pressure 120/70, pulse 69, height '5\' 2"'$  (1.575 m), weight 165 lb (74.8 kg), SpO2 95 %.   General: Jocelyn [name] apparent distress alert and oriented x3 mood and affect normal, dressed appropriately.  HEENT: Pupils equal, extraocular movements intact  Respiratory: Patient's speak in full sentences and does not appear short of breath  Cardiovascular: Jocelyn [name] lower extremity edema, non tender, Jocelyn [name] erythema  Patient's knee tender to palpation.  Deferred rest of the exam after discussing with patient.    Impression and Recommendations:    The above documentation has been reviewed and is accurate and complete Lyndal Pulley, DO

## 2022-04-29 ENCOUNTER — Ambulatory Visit: Payer: Medicare PPO | Admitting: Family Medicine

## 2022-04-29 ENCOUNTER — Encounter: Payer: Self-pay | Admitting: Family Medicine

## 2022-04-29 VITALS — BP 120/70 | HR 69 | Ht 62.0 in | Wt 165.0 lb

## 2022-04-29 DIAGNOSIS — S83241D Other tear of medial meniscus, current injury, right knee, subsequent encounter: Secondary | ICD-10-CM | POA: Diagnosis not present

## 2022-04-29 DIAGNOSIS — M1711 Unilateral primary osteoarthritis, right knee: Secondary | ICD-10-CM

## 2022-04-29 MED ORDER — MELOXICAM 7.5 MG PO TABS: 7.5000 mg | ORAL_TABLET | Freq: Every day | ORAL | 0 refills | Status: DC

## 2022-04-29 NOTE — Patient Instructions (Addendum)
Dr. Latanya Maudlin  will call you Meloxicam 7.'5mg'$  daily No other NSAIDs Have appt mid July before your trip

## 2022-04-29 NOTE — Assessment & Plan Note (Signed)
Patient has not responded well to the conservative therapy.  MRI does show moderate to high-grade medial arthritic changes as well as some mild arthritic changes of the patellofemoral joint.  Patient does have multiple degenerative changes of the medial meniscus as well as a tear of the lateral meniscus.  I do believe that patient could respond well to any arthroscopic procedure stated that with the amount of arthritis do not know exactly if she is a candidate for it or not.  Patient will be referred to discuss treatment and orthopedic surgeon in the near future.  Discussed with patient at great length as well as reviewing the picture and previous notes greater than 36 minutes

## 2022-05-03 ENCOUNTER — Other Ambulatory Visit: Payer: Self-pay | Admitting: Interventional Cardiology

## 2022-05-05 DIAGNOSIS — R531 Weakness: Secondary | ICD-10-CM | POA: Diagnosis not present

## 2022-05-05 DIAGNOSIS — R262 Difficulty in walking, not elsewhere classified: Secondary | ICD-10-CM | POA: Diagnosis not present

## 2022-05-19 DIAGNOSIS — R531 Weakness: Secondary | ICD-10-CM | POA: Diagnosis not present

## 2022-05-19 DIAGNOSIS — R262 Difficulty in walking, not elsewhere classified: Secondary | ICD-10-CM | POA: Diagnosis not present

## 2022-05-24 NOTE — Progress Notes (Signed)
Jocelyn [name] [address] [place] Phone: [phone] Subjective:   Jocelyn [name], am serving as a scribe for Dr. Hulan Saas.  I'm seeing this patient by the request  of:  Patient, No Pcp Per  CC: Right knee pain  [id number]  04/29/2022 Patient has not responded well to the conservative therapy.  MRI does show moderate to high-grade medial arthritic changes as well as some mild arthritic changes of the patellofemoral joint.  Patient does have multiple degenerative changes of the medial meniscus as well as a tear of the lateral meniscus.  I do believe that patient could respond well to any arthroscopic procedure stated that with the amount of arthritis do not know exactly if she is a candidate for it or not.  Patient will be referred to discuss treatment and orthopedic surgeon in the near future.  Discussed with patient at great length as well as reviewing the picture and previous notes greater than 36 minutes  Updated 05/28/2022 Jocelyn [name] is a 66 y.o. female coming in with complaint of knee pain. Same. No changes. Can't get into other doctors office until later this month [name]       Past Medical History:  Diagnosis Date   Acute bronchitis    Allergic rhinitis, cause unspecified    Anemia    Anxiety    B12 deficiency    BV (bacterial vaginosis) 06/22/1996   Calculus of kidney    Calculus of kidney    Coronary atherosclerosis of unspecified type of vessel, native or graft    Dizziness    Essential hypertension, benign    Fibroid 2003   Fibromyalgia    H/O dysmenorrhea 2008   H/O varicella    Headache(784.0)    frequently   HSV-2 infection 2009   Hyperplastic colon polyp 05/16/2014   Irritable bowel syndrome    Meniere's disease, unspecified    Menses, irregular 2003   Myalgia and myositis, unspecified    Myocardial infarction ([place])    Obstructive sleep apnea (adult) (pediatric)    Perimenopausal symptoms 2003    Pure hypercholesterolemia    Sarcoidosis    Type II or unspecified type diabetes mellitus without mention of complication, not stated as uncontrolled    Unspecified venous (peripheral) insufficiency    Vitamin D deficiency    Vulvitis 2010   Yeast infection    Past Surgical History:  Procedure Laterality Date   COLONOSCOPY     CORONARY STENT INTERVENTION N/A 06/15/2017   Procedure: CORONARY STENT INTERVENTION;  Surgeon: Jettie Booze, MD;  Location: [facility];  Service: Cardiovascular;  Laterality: N/A;   heart catherization     LEFT HEART CATH AND CORONARY ANGIOGRAPHY N/A 06/15/2017   Procedure: LEFT HEART CATH AND CORONARY ANGIOGRAPHY;  Surgeon: Jettie Booze, MD;  Location: [facility];  Service: Cardiovascular;  Laterality: N/A;   TONSILLECTOMY     Social History   Socioeconomic History   Marital status: Single    Spouse name: Not on file   Number of children: 1   Years of education: Not on file   Highest education level: Not on file  Occupational History   Occupation: Retired Pharmacist, hospital   Tobacco Use   Smoking status: Never   Smokeless tobacco: Never   Tobacco comments:    Daily Caffeine - 1  Exercise 2-3 times/weekly  Vaping Use   Vaping Use: Never used  Substance and Sexual Activity  Alcohol use: No    Alcohol/week: 0.0 standard drinks of alcohol   Drug use: No   Sexual activity: Yes    Birth control/protection: None  Other Topics Concern   Not on file  Social History Narrative   Exercises 2-3 times weekly   Caffeine Use: 1 daily (tea or Pepsi)   Lives alone in a one story home.  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     Social Determinants of Health   Financial Resource Strain: Not on file  Food Insecurity: Not on file  Transportation Needs: Not on file  Physical Activity: Not on file  Stress: Not on file  Social Connections: Not on file   Allergies  Allergen Reactions   Actos [Pioglitazone] Other (See Comments)     REACTION: pt states INTOL w/ edema   Codeine Other (See Comments)    REACTION: vomiting   Januvia [Sitagliptin] Nausea Only   Lactose Intolerance (Gi) Diarrhea   Morphine Nausea Only and Nausea And Vomiting    REACTION: vomiting REACTION: vomiting   Repatha [Evolocumab]     WAS NOT EFFECTIVE IN LOWERING LDL   Family History  Adopted: Yes  Problem Relation Age of Onset   Other Other        ADOPTED   Colon cancer Neg Hx    Esophageal cancer Neg Hx    Rectal cancer Neg Hx    Stomach cancer Neg Hx     Current Outpatient Medications (Endocrine & Metabolic):    Empagliflozin-metFORMIN HCl ER 25-1000 MG TB24, Take 1 tablet by mouth daily.   TRULICITY 2.02 RK/2.7CW SOPN, Inject 1 mL into the skin once a week.  Current Outpatient Medications (Cardiovascular):    Alirocumab (PRALUENT) 150 MG/ML SOAJ, Inject 1 pen into the skin every 14 (fourteen) days.   Bempedoic Acid (NEXLETOL) 180 MG TABS, Take 1 tablet by mouth daily.   diltiazem (CARDIZEM CD) 240 MG 24 hr capsule, TAKE 1 CAPSULE(240 MG) BY MOUTH DAILY   losartan (COZAAR) 100 MG tablet, TAKE 1 TABLET(100 MG) BY MOUTH DAILY   metoprolol tartrate (LOPRESSOR) 50 MG tablet, TAKE 1 TABLET(50 MG) BY MOUTH TWICE DAILY   nitroGLYCERIN (NITROSTAT) 0.4 MG SL tablet, Place 1 tablet (0.4 mg total) under the tongue every 5 (five) minutes as needed for chest pain.   spironolactone (ALDACTONE) 25 MG tablet, TAKE 1 TABLET(25 MG) BY MOUTH DAILY   VASCEPA 1 g capsule, TAKE 2 CAPSULES(2 GRAMS) BY MOUTH TWICE DAILY   Current Outpatient Medications (Analgesics):    allopurinol (ZYLOPRIM) 100 MG tablet, Take 1 tablet by mouth daily as needed.   meloxicam (MOBIC) 7.5 MG tablet, Take 1 tablet (7.5 mg total) by mouth daily.  Current Outpatient Medications (Hematological):    clopidogrel (PLAVIX) 75 MG tablet, Take 1 tablet (75 mg total) by mouth daily.   clopidogrel (PLAVIX) 75 MG tablet, Take 1 tablet by mouth daily.  Current Outpatient Medications  (Other):    ALPRAZolam (XANAX) 0.25 MG tablet, Take 1 tablet by mouth daily as needed.   azaTHIOprine (IMURAN) 50 MG tablet, Take 1 tablet by mouth 2 (two) times daily.   GEMTESA 75 MG TABS, Take 1 tablet by mouth daily.   ONETOUCH VERIO test strip, CHECK BLOOD SUGAR TID AS DIRECTED   valACYclovir (VALTREX) 500 MG tablet, Take 1 tablet by mouth daily as needed.   Vitamin D, Ergocalciferol, (DRISDOL) 1.25 MG (50000 UNIT) CAPS capsule, Take 1 capsule (50,000 Units total) by mouth every 7 (seven) days.   Reviewed  prior external information including notes and imaging from  primary care provider As well as notes that were available from care everywhere and other healthcare systems.  Past medical history, social, surgical and family history all reviewed in electronic medical record.  No pertanent information unless stated regarding to the chief complaint.   Review of Systems:  No headache, visual changes, nausea, vomiting, diarrhea, constipation, dizziness, abdominal pain, skin rash, fevers, chills, night sweats, weight loss, swollen lymph nodes, body aches, joint swelling, chest pain, shortness of breath, mood changes. POSITIVE muscle aches  Objective  Blood pressure 110/60, pulse 77, height '5\' 2"'$  (1.575 m), weight 159 lb (72.1 kg), SpO2 99 %.   General: No apparent distress alert and oriented x3 mood and affect normal, dressed appropriately.  HEENT: Pupils equal, extraocular movements intact  Respiratory: Patient's speak in full sentences and does not appear short of breath  Cardiovascular: No lower extremity edema, non tender, no erythema  Antalgic gait noted.  Right knee does have trace effusion noted.  Lacks the last 5 degrees of flexion.  Instability with valgus and varus force.  Crepitus noted  After informed written and verbal consent, patient was seated on exam table. Right knee was prepped with alcohol swab and utilizing anterolateral approach, patient's right knee space was injected  with 4:1  marcaine 0.5%: Kenalog '40mg'$ /dL. Patient tolerated the procedure well without immediate complications.    Impression and Recommendations:     The above documentation has been reviewed and is accurate and complete Jocelyn Pulley, DO

## 2022-05-26 DIAGNOSIS — R262 Difficulty in walking, not elsewhere classified: Secondary | ICD-10-CM | POA: Diagnosis not present

## 2022-05-26 DIAGNOSIS — R531 Weakness: Secondary | ICD-10-CM | POA: Diagnosis not present

## 2022-05-28 ENCOUNTER — Ambulatory Visit: Payer: Medicare PPO | Admitting: Family Medicine

## 2022-05-28 DIAGNOSIS — M1711 Unilateral primary osteoarthritis, right knee: Secondary | ICD-10-CM

## 2022-05-28 NOTE — Assessment & Plan Note (Signed)
Patient given injection today and tolerated the procedure well.  Patient is considering the possibility of the surgical replacement.  Patient will consider at some point in the near future.  Patient will will hopefully make some improvement while patient will be traveling.  Has the meloxicam for breakthrough pain but only to do for 2 to 3 days in a row.  Encouraged her not to use it very frequently secondary to patient being on the Plavix.  Follow-up with me again in 6 to 8 weeks otherwise.

## 2022-05-28 NOTE — Patient Instructions (Addendum)
Good to see you! Injection in both knees today Have fun on your trip to [place] See Surgeon when you get back

## 2022-06-16 DIAGNOSIS — M1711 Unilateral primary osteoarthritis, right knee: Secondary | ICD-10-CM | POA: Diagnosis not present

## 2022-06-17 ENCOUNTER — Telehealth: Payer: Self-pay | Admitting: *Deleted

## 2022-06-17 NOTE — Telephone Encounter (Signed)
I s/w the pt and she is states she is not sure yet if she is going to have surgery or not. Pt tells me she has some things she needs to figure out first before deciding for sure if having surgery or not. Pt asked if she could just call back. I said that will be fine and she can call back when she is ready to set up a tele pre op appt. I did advise the pt that once she decides to go ahead to let us know so we can make appt. Pt agreeable to plan of care and thanked me for the help. In the meantime I will remove from the pre op call back pool until the pt calls back and ready to set up appt.    I will update requesting office as well of my conversation today.

## 2022-06-17 NOTE — Telephone Encounter (Signed)
   Name: Jocelyn [name]  DOB: 29-Dec-1955  MRN: [id number]  Primary Cardiologist: Larae Grooms, MD  Chart reviewed as part of pre-operative protocol coverage. Because of Jocelyn [name]'s past medical history and time since last visit, she will require a follow-up tele visit in order to better assess preoperative cardiovascular risk.  Pre-op covering staff: - Please schedule appointment and call patient to inform them. If patient already had an upcoming appointment within acceptable timeframe, please add "pre-op clearance" to the appointment notes so provider is aware. - Please contact requesting surgeon's office via preferred method (i.e, phone, fax) to inform them of need for appointment prior to surgery.  No medications indicated needing to be held.   Elgie Collard, PA-C  06/17/2022, 11:19 AM

## 2022-06-17 NOTE — Telephone Encounter (Signed)
   Pre-operative Risk Assessment    Patient Name: Jocelyn [name]  DOB: [birth year] MRN: [id number]      Request for Surgical Clearance    Procedure:   RIGHT KNEE ARTHORPLASTY  Date of Surgery:  Clearance TBD                                 Surgeon:  Melrose Nakayama, MD Surgeon's Group or Practice Name:  [facility] Phone number:  [phone] Fax number:  [phone]   Type of Clearance Requested:   - Medical    Type of Anesthesia:  Spinal   Additional requests/questions:    SignedJeanann Lewandowsky   06/17/2022, 10:50 AM

## 2022-06-23 DIAGNOSIS — R262 Difficulty in walking, not elsewhere classified: Secondary | ICD-10-CM | POA: Diagnosis not present

## 2022-06-23 DIAGNOSIS — R531 Weakness: Secondary | ICD-10-CM | POA: Diagnosis not present

## 2022-06-28 DIAGNOSIS — D3502 Benign neoplasm of left adrenal gland: Secondary | ICD-10-CM | POA: Diagnosis not present

## 2022-06-28 DIAGNOSIS — E669 Obesity, unspecified: Secondary | ICD-10-CM | POA: Diagnosis not present

## 2022-06-28 DIAGNOSIS — I2510 Atherosclerotic heart disease of native coronary artery without angina pectoris: Secondary | ICD-10-CM | POA: Diagnosis not present

## 2022-06-28 DIAGNOSIS — E1151 Type 2 diabetes mellitus with diabetic peripheral angiopathy without gangrene: Secondary | ICD-10-CM | POA: Diagnosis not present

## 2022-06-29 DIAGNOSIS — Z79899 Other long term (current) drug therapy: Secondary | ICD-10-CM | POA: Diagnosis not present

## 2022-06-29 DIAGNOSIS — E663 Overweight: Secondary | ICD-10-CM | POA: Diagnosis not present

## 2022-06-29 DIAGNOSIS — M25561 Pain in right knee: Secondary | ICD-10-CM | POA: Diagnosis not present

## 2022-06-29 DIAGNOSIS — M5136 Other intervertebral disc degeneration, lumbar region: Secondary | ICD-10-CM | POA: Diagnosis not present

## 2022-06-29 DIAGNOSIS — J948 Other specified pleural conditions: Secondary | ICD-10-CM | POA: Diagnosis not present

## 2022-06-29 DIAGNOSIS — Z6827 Body mass index (BMI) 27.0-27.9, adult: Secondary | ICD-10-CM | POA: Diagnosis not present

## 2022-06-29 DIAGNOSIS — M3322 Polymyositis with myopathy: Secondary | ICD-10-CM | POA: Diagnosis not present

## 2022-07-09 DIAGNOSIS — R262 Difficulty in walking, not elsewhere classified: Secondary | ICD-10-CM | POA: Diagnosis not present

## 2022-07-09 DIAGNOSIS — R531 Weakness: Secondary | ICD-10-CM | POA: Diagnosis not present

## 2022-07-21 ENCOUNTER — Ambulatory Visit: Payer: Medicare PPO | Admitting: Family Medicine

## 2022-07-21 DIAGNOSIS — R531 Weakness: Secondary | ICD-10-CM | POA: Diagnosis not present

## 2022-07-21 DIAGNOSIS — R262 Difficulty in walking, not elsewhere classified: Secondary | ICD-10-CM | POA: Diagnosis not present

## 2022-08-03 ENCOUNTER — Other Ambulatory Visit: Payer: Self-pay | Admitting: Interventional Cardiology

## 2022-08-04 DIAGNOSIS — R262 Difficulty in walking, not elsewhere classified: Secondary | ICD-10-CM | POA: Diagnosis not present

## 2022-08-04 DIAGNOSIS — R531 Weakness: Secondary | ICD-10-CM | POA: Diagnosis not present

## 2022-08-06 ENCOUNTER — Other Ambulatory Visit: Payer: Self-pay | Admitting: Interventional Cardiology

## 2022-08-06 DIAGNOSIS — E782 Mixed hyperlipidemia: Secondary | ICD-10-CM

## 2022-08-06 DIAGNOSIS — Z955 Presence of coronary angioplasty implant and graft: Secondary | ICD-10-CM

## 2022-08-06 DIAGNOSIS — I2510 Atherosclerotic heart disease of native coronary artery without angina pectoris: Secondary | ICD-10-CM

## 2022-08-10 NOTE — Progress Notes (Unsigned)
[place] Phone: [phone] Subjective:   Jocelyn [name], am serving as a scribe for Dr. Hulan Saas.  I'm seeing this patient by the request  of:  Patient, [name] Pcp Per  CC: Right knee, bilateral knee pain and   [id number]  05/28/2022 Patient given injection today and tolerated the procedure well.  Patient is considering the possibility of the surgical replacement.  Patient will consider at some point in the near future.  Patient will will hopefully make some improvement while patient will be traveling.  Has the meloxicam for breakthrough pain but only to do for 2 to 3 days in a row.  Encouraged her not to use it very frequently secondary to patient being on the Plavix.  Follow-up with me again in 6 to 8 weeks otherwise.  Updated 08/11/2022 Jocelyn [name] is a 66 y.o. female coming in with complaint of R knee pain. Patient states that injection seems to be wearing off. Dr. Rhona Raider recommends knee replacement.       Past Medical History:  Diagnosis Date   Acute bronchitis    Allergic rhinitis, cause unspecified    Anemia    Anxiety    B12 deficiency    BV (bacterial vaginosis) 06/22/1996   Calculus of kidney    Calculus of kidney    Coronary atherosclerosis of unspecified type of vessel, native or graft    Dizziness    Essential hypertension, benign    Fibroid 2003   Fibromyalgia    H/O dysmenorrhea 2008   H/O varicella    Headache(784.0)    frequently   HSV-2 infection 2009   Hyperplastic colon polyp 05/16/2014   Irritable bowel syndrome    Meniere's disease, unspecified    Menses, irregular 2003   Myalgia and myositis, unspecified    Myocardial infarction ([place])    Obstructive sleep apnea (adult) (pediatric)    Perimenopausal symptoms 2003   Pure hypercholesterolemia    Sarcoidosis    Type II or unspecified type diabetes mellitus without mention of complication, not stated as uncontrolled     Unspecified venous (peripheral) insufficiency    Vitamin D deficiency    Vulvitis 2010   Yeast infection    Past Surgical History:  Procedure Laterality Date   COLONOSCOPY     CORONARY STENT INTERVENTION N/A 06/15/2017   Procedure: CORONARY STENT INTERVENTION;  Surgeon: Jettie Booze, MD;  Location: [facility];  Service: Cardiovascular;  Laterality: N/A;   heart catherization     LEFT HEART CATH AND CORONARY ANGIOGRAPHY N/A 06/15/2017   Procedure: LEFT HEART CATH AND CORONARY ANGIOGRAPHY;  Surgeon: Jettie Booze, MD;  Location: [facility];  Service: Cardiovascular;  Laterality: N/A;   TONSILLECTOMY     Social History   Socioeconomic History   Marital status: Single    Spouse name: Not on file   Number of children: 1   Years of education: Not on file   Highest education level: Not on file  Occupational History   Occupation: Retired Pharmacist, hospital   Tobacco Use   Smoking status: Never   Smokeless tobacco: Never   Tobacco comments:    Daily Caffeine - 1  Exercise 2-3 times/weekly  Vaping Use   Vaping Use: Never used  Substance and Sexual Activity   Alcohol use: [name]    Alcohol/week: 0.0 standard drinks of alcohol   Drug use: [name]   Sexual activity: Yes  Birth control/protection: None  Other Topics Concern   Not on file  Social History Narrative   Exercises 2-3 times weekly   Caffeine Use: 1 daily (tea or Pepsi)   Lives alone in a one story home.  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     Social Determinants of Health   Financial Resource Strain: Not on file  Food Insecurity: Not on file  Transportation Needs: Not on file  Physical Activity: Not on file  Stress: Not on file  Social Connections: Not on file   Allergies  Allergen Reactions   Actos [Pioglitazone] Other (See Comments)    REACTION: pt states INTOL w/ edema   Codeine Other (See Comments)    REACTION: vomiting   Januvia [Sitagliptin] Nausea Only   Lactose Intolerance (Gi) Diarrhea    Morphine Nausea Only and Nausea And Vomiting    REACTION: vomiting REACTION: vomiting   Repatha [Evolocumab]     WAS NOT EFFECTIVE IN LOWERING LDL   Family History  Adopted: Yes  Problem Relation Age of Onset   Other Other        ADOPTED   Colon cancer Neg Hx    Esophageal cancer Neg Hx    Rectal cancer Neg Hx    Stomach cancer Neg Hx     Current Outpatient Medications (Endocrine & Metabolic):    Empagliflozin-metFORMIN HCl ER 25-1000 MG TB24, Take 1 tablet by mouth daily.   TRULICITY 1.24 PY/0.9XI SOPN, Inject 1 mL into the skin once a week.  Current Outpatient Medications (Cardiovascular):    Bempedoic Acid (NEXLETOL) 180 MG TABS, Take 1 tablet by mouth daily.   diltiazem (CARDIZEM CD) 240 MG 24 hr capsule, TAKE 1 CAPSULE(240 MG) BY MOUTH DAILY   losartan (COZAAR) 100 MG tablet, TAKE 1 TABLET(100 MG) BY MOUTH DAILY   metoprolol tartrate (LOPRESSOR) 50 MG tablet, TAKE 1 TABLET(50 MG) BY MOUTH TWICE DAILY   nitroGLYCERIN (NITROSTAT) 0.4 MG SL tablet, Place 1 tablet (0.4 mg total) under the tongue every 5 (five) minutes as needed for chest pain.   PRALUENT 150 MG/ML SOAJ, INJECT 150 MG INTO THE SKIN EVERY 14 DAYS   spironolactone (ALDACTONE) 25 MG tablet, TAKE 1 TABLET(25 MG) BY MOUTH DAILY. Please keep December appointment for future refills. Thank you.   VASCEPA 1 g capsule, TAKE 2 CAPSULES(2 GRAMS) BY MOUTH TWICE DAILY   Current Outpatient Medications (Analgesics):    allopurinol (ZYLOPRIM) 100 MG tablet, Take 1 tablet by mouth daily as needed.   meloxicam (MOBIC) 7.5 MG tablet, Take 1 tablet (7.5 mg total) by mouth daily.  Current Outpatient Medications (Hematological):    clopidogrel (PLAVIX) 75 MG tablet, Take 1 tablet by mouth daily.   clopidogrel (PLAVIX) 75 MG tablet, Take 1 tablet (75 mg total) by mouth daily. Please keep December appointment for future refills. Thank you.  Current Outpatient Medications (Other):    ALPRAZolam (XANAX) 0.25 MG tablet, Take 1 tablet  by mouth daily as needed.   azaTHIOprine (IMURAN) 50 MG tablet, Take 1 tablet by mouth 2 (two) times daily.   GEMTESA 75 MG TABS, Take 1 tablet by mouth daily.   ONETOUCH VERIO test strip, CHECK BLOOD SUGAR TID AS DIRECTED   valACYclovir (VALTREX) 500 MG tablet, Take 1 tablet by mouth daily as needed.   Vitamin D, Ergocalciferol, (DRISDOL) 1.25 MG (50000 UNIT) CAPS capsule, Take 1 capsule (50,000 Units total) by mouth every 7 (seven) days.   Reviewed prior external information including notes and imaging from  primary care provider As well as notes that were available from care everywhere and other healthcare systems.  Past medical history, social, surgical and family history all reviewed in electronic medical record.  [name] pertanent information unless stated regarding to the chief complaint.   Review of Systems:  [name] headache, visual changes, nausea, vomiting, diarrhea, constipation, dizziness, abdominal pain, skin rash, fevers, chills, night sweats, weight loss, swollen lymph nodes,  chest pain, shortness of breath, mood changes. POSITIVE muscle aches, body aches, joint swelling  Objective  Blood pressure 110/70, pulse 71, height '5\' 2"'$  (1.575 m), weight 159 lb (72.1 kg), SpO2 99 %.   General: [name] apparent distress alert and oriented x3 mood and affect normal, dressed appropriately.  HEENT: Pupils equal, extraocular movements intact  Respiratory: Patient's speak in full sentences and does not appear short of breath  Cardiovascular: [name] lower extremity edema, non tender, [name] erythema  mild antalgic gait.  Patient does have tenderness to palpation noted bilaterally.  Seems to be smaller effusion on the left side than the right side.  Patient does have more limited range of motion on the right side.  After informed written and verbal consent, patient was seated on exam table. Right knee was prepped with alcohol swab and utilizing anterolateral approach, patient's right knee space was injected with  4:1  marcaine 0.5%: Kenalog '40mg'$ /dL. Patient tolerated the procedure well without immediate complications.  After informed written and verbal consent, patient was seated on exam table. Left knee was prepped with alcohol swab and utilizing anterolateral approach, patient's left knee space was injected with 4:1  marcaine 0.5%: Kenalog '40mg'$ /dL. Patient tolerated the procedure well without immediate complications.    Impression and Recommendations:     The above documentation has been reviewed and is accurate and complete Jocelyn Pulley, DO

## 2022-08-11 ENCOUNTER — Ambulatory Visit: Payer: Medicare PPO | Admitting: Family Medicine

## 2022-08-11 DIAGNOSIS — M170 Bilateral primary osteoarthritis of knee: Secondary | ICD-10-CM | POA: Diagnosis not present

## 2022-08-11 NOTE — Patient Instructions (Addendum)
Injected both knees today Good to see you See me again in 10 weeks

## 2022-08-11 NOTE — Assessment & Plan Note (Signed)
Chronic problem with exacerbation.  Having worsening left knee pain as well.  Bilateral injections given today.  Patient wants to hold on any type of replacement but will be considering it next year.  Discussed which activities to do and which ones to avoid.  Follow-up again in 12 weeks.

## 2022-08-25 DIAGNOSIS — R262 Difficulty in walking, not elsewhere classified: Secondary | ICD-10-CM | POA: Diagnosis not present

## 2022-08-25 DIAGNOSIS — R531 Weakness: Secondary | ICD-10-CM | POA: Diagnosis not present

## 2022-08-30 DIAGNOSIS — E1169 Type 2 diabetes mellitus with other specified complication: Secondary | ICD-10-CM | POA: Diagnosis not present

## 2022-09-02 ENCOUNTER — Other Ambulatory Visit: Payer: Self-pay | Admitting: Interventional Cardiology

## 2022-09-09 DIAGNOSIS — L0231 Cutaneous abscess of buttock: Secondary | ICD-10-CM | POA: Diagnosis not present

## 2022-09-09 DIAGNOSIS — I10 Essential (primary) hypertension: Secondary | ICD-10-CM | POA: Diagnosis not present

## 2022-09-09 DIAGNOSIS — E1169 Type 2 diabetes mellitus with other specified complication: Secondary | ICD-10-CM | POA: Diagnosis not present

## 2022-09-09 DIAGNOSIS — K610 Anal abscess: Secondary | ICD-10-CM | POA: Diagnosis not present

## 2022-09-10 DIAGNOSIS — L0231 Cutaneous abscess of buttock: Secondary | ICD-10-CM | POA: Diagnosis not present

## 2022-09-10 DIAGNOSIS — K610 Anal abscess: Secondary | ICD-10-CM | POA: Diagnosis not present

## 2022-09-27 ENCOUNTER — Other Ambulatory Visit: Payer: Self-pay | Admitting: Interventional Cardiology

## 2022-09-27 MED ORDER — NEXLETOL 180 MG PO TABS: 1.0000 | ORAL_TABLET | Freq: Every day | ORAL | 1 refills | Status: DC

## 2022-09-29 DIAGNOSIS — M3322 Polymyositis with myopathy: Secondary | ICD-10-CM | POA: Diagnosis not present

## 2022-10-01 ENCOUNTER — Other Ambulatory Visit: Payer: Self-pay | Admitting: Interventional Cardiology

## 2022-10-05 DIAGNOSIS — L0231 Cutaneous abscess of buttock: Secondary | ICD-10-CM | POA: Diagnosis not present

## 2022-10-05 DIAGNOSIS — Z9889 Other specified postprocedural states: Secondary | ICD-10-CM | POA: Diagnosis not present

## 2022-10-17 DIAGNOSIS — I10 Essential (primary) hypertension: Secondary | ICD-10-CM | POA: Diagnosis not present

## 2022-10-17 DIAGNOSIS — E119 Type 2 diabetes mellitus without complications: Secondary | ICD-10-CM | POA: Diagnosis not present

## 2022-10-17 DIAGNOSIS — M79602 Pain in left arm: Secondary | ICD-10-CM | POA: Diagnosis not present

## 2022-10-17 DIAGNOSIS — I252 Old myocardial infarction: Secondary | ICD-10-CM | POA: Diagnosis not present

## 2022-10-17 DIAGNOSIS — Z0130 Encounter for examination of blood pressure without abnormal findings: Secondary | ICD-10-CM | POA: Diagnosis not present

## 2022-10-17 DIAGNOSIS — R208 Other disturbances of skin sensation: Secondary | ICD-10-CM | POA: Diagnosis not present

## 2022-10-17 DIAGNOSIS — B029 Zoster without complications: Secondary | ICD-10-CM | POA: Diagnosis not present

## 2022-10-17 DIAGNOSIS — Z6828 Body mass index (BMI) 28.0-28.9, adult: Secondary | ICD-10-CM | POA: Diagnosis not present

## 2022-10-17 DIAGNOSIS — L299 Pruritus, unspecified: Secondary | ICD-10-CM | POA: Diagnosis not present

## 2022-10-25 DIAGNOSIS — I252 Old myocardial infarction: Secondary | ICD-10-CM | POA: Diagnosis not present

## 2022-10-25 DIAGNOSIS — Z6829 Body mass index (BMI) 29.0-29.9, adult: Secondary | ICD-10-CM | POA: Diagnosis not present

## 2022-10-25 DIAGNOSIS — Z0130 Encounter for examination of blood pressure without abnormal findings: Secondary | ICD-10-CM | POA: Diagnosis not present

## 2022-10-25 DIAGNOSIS — M79602 Pain in left arm: Secondary | ICD-10-CM | POA: Diagnosis not present

## 2022-10-25 DIAGNOSIS — B028 Zoster with other complications: Secondary | ICD-10-CM | POA: Diagnosis not present

## 2022-10-25 DIAGNOSIS — E119 Type 2 diabetes mellitus without complications: Secondary | ICD-10-CM | POA: Diagnosis not present

## 2022-10-25 DIAGNOSIS — Z79899 Other long term (current) drug therapy: Secondary | ICD-10-CM | POA: Diagnosis not present

## 2022-10-25 DIAGNOSIS — I10 Essential (primary) hypertension: Secondary | ICD-10-CM | POA: Diagnosis not present

## 2022-10-25 DIAGNOSIS — L299 Pruritus, unspecified: Secondary | ICD-10-CM | POA: Diagnosis not present

## 2022-10-25 NOTE — Progress Notes (Deleted)
Cardiology Office Note   Date:  10/25/2022   ID:  Jocelyn [name], DOB 11-03-1956, MRN [id number]  PCP:  Patient, No Pcp Per    No chief complaint on file.  CAD  Wt Readings from Last 3 Encounters:  08/11/22 159 lb (72.1 kg)  05/28/22 159 lb (72.1 kg)  04/29/22 165 lb (74.8 kg)       History of Present Illness: Jocelyn [name] is a 66 y.o. female    With CAD.  Cath many years ago Showed branch vessel disease. She has had difficulty tolerating statins. Eventually, she was put on Repatha.   She had an inferior STEMI on 06/15/2017. She received a drug-eluting stent to her distal right coronary artery. She had mild LAD disease. Her diagonal vessel disease has persisted over the years. Of note, she had severe radial spasm and it was noted that she had subclavian artery lusoria variant which made right radial approach quite difficult. This anatomic abnormality had been noted on a prior CT scan done at [facility].  She had arm pain, heartburn, back pain when she had her MI.     Echo in 8/18 showed: Left ventricle: The cavity size was normal. There was mild   concentric hypertrophy. Systolic function was normal. The   estimated ejection fraction was in the range of 50% to 55%. Wall   motion was normal; there were no regional wall motion   abnormalities. Doppler parameters are consistent with abnormal   left ventricular relaxation (grade 1 diastolic dysfunction). - Aortic valve: There was no regurgitation. - Mitral valve: There was mild regurgitation. - Right ventricle: The cavity size was normal. Wall thickness was   normal. Systolic function was normal. - Atrial septum: No defect or patent foramen ovale was identified   by color flow Doppler. - Tricuspid valve: There was no regurgitation.   She has had issues with HTN and headaches.  THis seems to have resolved.   She had a pneumonia and pleural effusion in 10/18.  It was bloody.  Her Brilinta was stopped at that time for  thoracentesis.   Knee pain limits exercise. No prolonged walking due to knee.  Working at [employer].  Walks inside [employer].   In 2020,  She wore a BP monitor 24 hours, avg BP 138/79.    She had COVID in June 2022.  Sx lasted about three weeks.  She took antiviral.     In the past, she noted: "Some chest pain with stress, but different type of sx than MI.  She has DOE."   Denies : Chest pain. Dizziness. Leg edema. Nitroglycerin use. Orthopnea. Palpitations. Paroxysmal nocturnal dyspnea. Shortness of breath. Syncope.      Walking limited by knee pain.     Past Medical History:  Diagnosis Date   Acute bronchitis    Allergic rhinitis, cause unspecified    Anemia    Anxiety    B12 deficiency    BV (bacterial vaginosis) 06/22/1996   Calculus of kidney    Calculus of kidney    Coronary atherosclerosis of unspecified type of vessel, native or graft    Dizziness    Essential hypertension, benign    Fibroid 2003   Fibromyalgia    H/O dysmenorrhea 2008   H/O varicella    Headache(784.0)    frequently   HSV-2 infection 2009   Hyperplastic colon polyp 05/16/2014   Irritable bowel syndrome    Meniere's disease, unspecified  Menses, irregular 2003   Myalgia and myositis, unspecified    Myocardial infarction ([place])    Obstructive sleep apnea (adult) (pediatric)    Perimenopausal symptoms 2003   Pure hypercholesterolemia    Sarcoidosis    Type II or unspecified type diabetes mellitus without mention of complication, not stated as uncontrolled    Unspecified venous (peripheral) insufficiency    Vitamin D deficiency    Vulvitis 2010   Yeast infection     Past Surgical History:  Procedure Laterality Date   COLONOSCOPY     CORONARY STENT INTERVENTION N/A 06/15/2017   Procedure: CORONARY STENT INTERVENTION;  Surgeon: Jettie Booze, MD;  Location: [facility];  Service: Cardiovascular;  Laterality: N/A;   heart catherization     LEFT HEART CATH AND CORONARY  ANGIOGRAPHY N/A 06/15/2017   Procedure: LEFT HEART CATH AND CORONARY ANGIOGRAPHY;  Surgeon: Jettie Booze, MD;  Location: [facility];  Service: Cardiovascular;  Laterality: N/A;   TONSILLECTOMY       Current Outpatient Medications  Medication Sig Dispense Refill   allopurinol (ZYLOPRIM) 100 MG tablet Take 1 tablet by mouth daily as needed.     ALPRAZolam (XANAX) 0.25 MG tablet Take 1 tablet by mouth daily as needed.     azaTHIOprine (IMURAN) 50 MG tablet Take 1 tablet by mouth 2 (two) times daily.     Bempedoic Acid (NEXLETOL) 180 MG TABS Take 1 tablet by mouth daily. 90 tablet 1   clopidogrel (PLAVIX) 75 MG tablet Take 1 tablet by mouth daily.     clopidogrel (PLAVIX) 75 MG tablet Take 1 tablet (75 mg total) by mouth daily. Please keep December appointment for future refills. Thank you. 90 tablet 0   diltiazem (CARDIZEM CD) 240 MG 24 hr capsule TAKE 1 CAPSULE(240 MG) BY MOUTH DAILY 90 capsule 1   Empagliflozin-metFORMIN HCl ER 25-1000 MG TB24 Take 1 tablet by mouth daily.     GEMTESA 75 MG TABS Take 1 tablet by mouth daily.     losartan (COZAAR) 100 MG tablet TAKE 1 TABLET(100 MG) BY MOUTH DAILY 90 tablet 0   meloxicam (MOBIC) 7.5 MG tablet Take 1 tablet (7.5 mg total) by mouth daily. 30 tablet 0   metoprolol tartrate (LOPRESSOR) 50 MG tablet TAKE 1 TABLET(50 MG) BY MOUTH TWICE DAILY 180 tablet 3   nitroGLYCERIN (NITROSTAT) 0.4 MG SL tablet Place 1 tablet (0.4 mg total) under the tongue every 5 (five) minutes as needed for chest pain. 25 tablet 2   ONETOUCH VERIO test strip CHECK BLOOD SUGAR TID AS DIRECTED     PRALUENT 150 MG/ML SOAJ INJECT 150 MG INTO THE SKIN EVERY 14 DAYS 6 mL 3   spironolactone (ALDACTONE) 25 MG tablet TAKE 1 TABLET(25 MG) BY MOUTH DAILY. Please keep December appointment for future refills. Thank you. 90 tablet 0   TRULICITY 7.12 WP/8.0DX SOPN Inject 1 mL into the skin once a week.     valACYclovir (VALTREX) 500 MG tablet Take 1 tablet by mouth daily as  needed.     VASCEPA 1 g capsule TAKE 2 CAPSULES(2 GRAMS) BY MOUTH TWICE DAILY 360 capsule 2   Vitamin D, Ergocalciferol, (DRISDOL) 1.25 MG (50000 UNIT) CAPS capsule Take 1 capsule (50,000 Units total) by mouth every 7 (seven) days. 12 capsule 0   No current facility-administered medications for this visit.    Allergies:   Actos [pioglitazone], Codeine, Januvia [sitagliptin], Lactose intolerance (gi), Morphine, and Repatha [evolocumab]    Social History:  The patient  reports that she has never smoked. She has never used smokeless tobacco. She reports that she does not drink alcohol and does not use drugs.   Family History:  The patient's ***family history includes Other in an other family member. She was adopted.    ROS:  Please see the history of present illness.   Otherwise, review of systems are positive for ***.   All other systems are reviewed and negative.    PHYSICAL EXAM: VS:  There were no vitals taken for this visit. , BMI There is no height or weight on file to calculate BMI. GEN: Well nourished, well developed, in no acute distress HEENT: normal Neck: no JVD, carotid bruits, or masses Cardiac: ***RRR; no murmurs, rubs, or gallops,no edema  Respiratory:  clear to auscultation bilaterally, normal work of breathing GI: soft, nontender, nondistended, + BS MS: no deformity or atrophy Skin: warm and dry, no rash Neuro:  Strength and sensation are intact Psych: euthymic mood, full affect   EKG:   The ekg ordered today demonstrates ***   Recent Labs: No results found for requested labs within last 365 days.   Lipid Panel    Component Value Date/Time   CHOL 149 08/22/2020 1609   TRIG 113 08/22/2020 1609   HDL 37 (L) 08/22/2020 1609   CHOLHDL 4.0 08/22/2020 1609   CHOLHDL 4 05/30/2018 1444   VLDL 36.2 05/30/2018 1444   LDLCALC 91 08/22/2020 1609   LDLDIRECT 91.6 08/14/2014 0822     Other studies Reviewed: Additional studies/ records that were reviewed today with  results demonstrating: ***.   ASSESSMENT AND PLAN:  CAD/old MI: Hyperlipidemia: Type 2 diabetes: Hypertension: Obesity:   Current medicines are reviewed at length with the patient today.  The patient concerns regarding her medicines were addressed.  The following changes have been made:  No change***  Labs/ tests ordered today include: *** No orders of the defined types were placed in this encounter.   Recommend 150 minutes/week of aerobic exercise Low fat, low carb, high fiber diet recommended  Disposition:   FU in ***   Signed, Larae Grooms, MD  10/25/2022 10:52 PM    [facility] [address], [place], [place]  [address] Phone: [phone]; Fax: [phone]

## 2022-10-26 ENCOUNTER — Ambulatory Visit: Payer: Medicare PPO | Admitting: Interventional Cardiology

## 2022-10-26 DIAGNOSIS — I10 Essential (primary) hypertension: Secondary | ICD-10-CM

## 2022-10-26 DIAGNOSIS — E1159 Type 2 diabetes mellitus with other circulatory complications: Secondary | ICD-10-CM

## 2022-10-26 DIAGNOSIS — E782 Mixed hyperlipidemia: Secondary | ICD-10-CM

## 2022-10-26 DIAGNOSIS — I25118 Atherosclerotic heart disease of native coronary artery with other forms of angina pectoris: Secondary | ICD-10-CM

## 2022-10-26 DIAGNOSIS — I252 Old myocardial infarction: Secondary | ICD-10-CM

## 2022-11-01 DIAGNOSIS — Z79899 Other long term (current) drug therapy: Secondary | ICD-10-CM | POA: Diagnosis not present

## 2022-11-03 ENCOUNTER — Other Ambulatory Visit: Payer: Self-pay | Admitting: Interventional Cardiology

## 2022-11-11 DIAGNOSIS — E1169 Type 2 diabetes mellitus with other specified complication: Secondary | ICD-10-CM | POA: Diagnosis not present

## 2022-11-11 DIAGNOSIS — J398 Other specified diseases of upper respiratory tract: Secondary | ICD-10-CM | POA: Diagnosis not present

## 2022-11-11 DIAGNOSIS — Z1159 Encounter for screening for other viral diseases: Secondary | ICD-10-CM | POA: Diagnosis not present

## 2022-11-11 DIAGNOSIS — N3281 Overactive bladder: Secondary | ICD-10-CM | POA: Diagnosis not present

## 2022-11-23 DIAGNOSIS — L0231 Cutaneous abscess of buttock: Secondary | ICD-10-CM | POA: Diagnosis not present

## 2022-11-23 DIAGNOSIS — Z9889 Other specified postprocedural states: Secondary | ICD-10-CM | POA: Diagnosis not present

## 2022-11-26 ENCOUNTER — Other Ambulatory Visit (HOSPITAL_COMMUNITY): Payer: Self-pay

## 2022-12-03 ENCOUNTER — Other Ambulatory Visit: Payer: Self-pay | Admitting: Interventional Cardiology

## 2022-12-15 NOTE — Progress Notes (Unsigned)
Cardiology Office Note   Date:  12/16/2022   ID:  Jocelyn [name], DOB [birth year], MRN [id number]  PCP:  Patient, No Pcp Per    No chief complaint on file.  CAD  Wt Readings from Last 3 Encounters:  12/16/22 152 lb 12.8 oz (69.3 kg)  08/11/22 159 lb (72.1 kg)  05/28/22 159 lb (72.1 kg)       History of Present Illness: Jocelyn [name] is a 67 y.o. female   With CAD.  Cath many years ago Showed branch vessel disease. She has had difficulty tolerating statins. Eventually, she was put on Repatha.   She had an inferior STEMI on 06/15/2017. She received a drug-eluting stent to her distal right coronary artery. She had mild LAD disease. Her diagonal vessel disease has persisted over the years. Of note, she had severe radial spasm and it was noted that she had subclavian artery lusoria variant which made right radial approach quite difficult. This anatomic abnormality had been noted on a prior CT scan done at [facility].  She had arm pain, heartburn, back pain when she had her MI.     Echo in 8/18 showed: Left ventricle: The cavity size was normal. There was mild   concentric hypertrophy. Systolic function was normal. The   estimated ejection fraction was in the range of 50% to 55%. Wall   motion was normal; there were no regional wall motion   abnormalities. Doppler parameters are consistent with abnormal   left ventricular relaxation (grade 1 diastolic dysfunction). - Aortic valve: There was no regurgitation. - Mitral valve: There was mild regurgitation. - Right ventricle: The cavity size was normal. Wall thickness was   normal. Systolic function was normal. - Atrial septum: No defect or patent foramen ovale was identified   by color flow Doppler. - Tricuspid valve: There was no regurgitation.   She has had issues with HTN and headaches.  THis seems to have resolved.   She had a pneumonia and pleural effusion in 10/18.  It was bloody.  Her Brilinta was stopped at that time  for thoracentesis.   Knee pain limits exercise. No prolonged walking due to knee.  Working at [employer].  Walks inside [employer].   In 2020,  She wore a BP monitor 24 hours, avg BP 138/79.    She had COVID in June.  Sx lasted about three weeks.  She took antiviral.     In the past, she noted: "Some chest pain with stress, but different type of sx than MI.  She has DOE."  Walking limited by knee pain.    Past Medical History:  Diagnosis Date   Acute bronchitis    Allergic rhinitis, cause unspecified    Anemia    Anxiety    B12 deficiency    BV (bacterial vaginosis) 06/22/1996   Calculus of kidney    Calculus of kidney    Coronary atherosclerosis of unspecified type of vessel, native or graft    Dizziness    Essential hypertension, benign    Fibroid 2003   Fibromyalgia    H/O dysmenorrhea 2008   H/O varicella    Headache(784.0)    frequently   HSV-2 infection 2009   Hyperplastic colon polyp 05/16/2014   Irritable bowel syndrome    Meniere's disease, unspecified    Menses, irregular 2003   Myalgia and myositis, unspecified    Myocardial infarction ([place])    Obstructive sleep apnea (adult) (pediatric)  Perimenopausal symptoms 2003   Pure hypercholesterolemia    Sarcoidosis    Type II or unspecified type diabetes mellitus without mention of complication, not stated as uncontrolled    Unspecified venous (peripheral) insufficiency    Vitamin D deficiency    Vulvitis 2010   Yeast infection     Past Surgical History:  Procedure Laterality Date   COLONOSCOPY     CORONARY STENT INTERVENTION N/A 06/15/2017   Procedure: CORONARY STENT INTERVENTION;  Surgeon: Jettie Booze, MD;  Location: [facility];  Service: Cardiovascular;  Laterality: N/A;   heart catherization     LEFT HEART CATH AND CORONARY ANGIOGRAPHY N/A 06/15/2017   Procedure: LEFT HEART CATH AND CORONARY ANGIOGRAPHY;  Surgeon: Jettie Booze, MD;  Location: [facility];  Service:  Cardiovascular;  Laterality: N/A;   TONSILLECTOMY       Current Outpatient Medications  Medication Sig Dispense Refill   allopurinol (ZYLOPRIM) 100 MG tablet Take 1 tablet by mouth daily as needed.     ALPRAZolam (XANAX) 0.25 MG tablet Take 1 tablet by mouth daily as needed.     azaTHIOprine (IMURAN) 50 MG tablet Take 1 tablet by mouth daily.     Bempedoic Acid (NEXLETOL) 180 MG TABS Take 1 tablet by mouth daily. 90 tablet 1   clopidogrel (PLAVIX) 75 MG tablet TAKE 1 TABLET BY MOUTH EVERY DAY 90 tablet 0   diltiazem (CARDIZEM CD) 240 MG 24 hr capsule TAKE 1 CAPSULE(240 MG) BY MOUTH DAILY 90 capsule 1   Empagliflozin-metFORMIN HCl ER 25-1000 MG TB24 Take 1 tablet by mouth daily.     GEMTESA 75 MG TABS Take 1 tablet by mouth daily.     losartan (COZAAR) 100 MG tablet TAKE 1 TABLET(100 MG) BY MOUTH DAILY 90 tablet 0   metoprolol tartrate (LOPRESSOR) 50 MG tablet TAKE 1 TABLET(50 MG) BY MOUTH TWICE DAILY 180 tablet 3   nitroGLYCERIN (NITROSTAT) 0.4 MG SL tablet Place 1 tablet (0.4 mg total) under the tongue every 5 (five) minutes as needed for chest pain. 25 tablet 2   ONETOUCH VERIO test strip CHECK BLOOD SUGAR TID AS DIRECTED     PRALUENT 150 MG/ML SOAJ INJECT 150 MG INTO THE SKIN EVERY 14 DAYS 6 mL 3   spironolactone (ALDACTONE) 25 MG tablet TAKE 1 TABLET BY MOUTH EVERY DAY 90 tablet 0   TRULICITY 3.35 KT/6.2BW SOPN Inject 1 mL into the skin once a week.     VASCEPA 1 g capsule TAKE 2 CAPSULES(2 GRAMS) BY MOUTH TWICE DAILY 360 capsule 2   No current facility-administered medications for this visit.    Allergies:   Actos [pioglitazone], Codeine, Januvia [sitagliptin], Lactose intolerance (gi), Morphine, and Repatha [evolocumab]    Social History:  The patient  reports that she has never smoked. She has never used smokeless tobacco. She reports that she does not drink alcohol and does not use drugs.   Family History:  The patient's family history includes Other in an other family member.  She was adopted.    ROS:  Please see the history of present illness.   Otherwise, review of systems are positive for decreased appetite.   All other systems are reviewed and negative.    PHYSICAL EXAM: VS:  BP 116/68   Pulse 72   Ht '5\' 2"'$  (1.575 m)   Wt 152 lb 12.8 oz (69.3 kg)   SpO2 97%   BMI 27.95 kg/m  , BMI Body mass index is 27.95 kg/m.  GEN: Well nourished, well developed, in no acute distress HEENT: normal Neck: no JVD, carotid bruits, or masses Cardiac: RRR; no murmurs, rubs, or gallops,no edema  Respiratory:  clear to auscultation bilaterally, normal work of breathing GI: soft, nontender, nondistended, + BS MS: no deformity or atrophy Skin: warm and dry, no rash Neuro:  Strength and sensation are intact Psych: euthymic mood, full affect   EKG:   The ekg ordered today demonstrates NSR, nonspecific ST changes   Recent Labs: No results found for requested labs within last 365 days.   Lipid Panel    Component Value Date/Time   CHOL 149 08/22/2020 1609   TRIG 113 08/22/2020 1609   HDL 37 (L) 08/22/2020 1609   CHOLHDL 4.0 08/22/2020 1609   CHOLHDL 4 05/30/2018 1444   VLDL 36.2 05/30/2018 1444   LDLCALC 91 08/22/2020 1609   LDLDIRECT 91.6 08/14/2014 0822     Other studies Reviewed: Additional studies/ records that were reviewed today with results demonstrating: labs reviewed.   ASSESSMENT AND PLAN:  CAD/old MI: No angina on medial therapy. Clopidogrel monotherapy. No bleeding issues.  Status post RCA stent. Hyperlipidemia: LDL 240.  Repatha was not effective for lowering cholesterol.  Now on Praluent and tolerating that well.  Need to get a repeat lipid panel.  Will refer her back to the lipid clinic as she is close to needing refills on her Praluent. Type 2 diabetes: Did not tolerate trulicity. A1C 6.1.  Taking Synjardy. Hypertension: The current medical regimen is effective;  continue present plan and medications. Overweight: gradually lost weight OSA:  falling asleep during the day, unintentionally. Observed snoring.    Current medicines are reviewed at length with the patient today.  The patient concerns regarding her medicines were addressed.  The following changes have been made:  No change  Labs/ tests ordered today include:  No orders of the defined types were placed in this encounter.   Recommend 150 minutes/week of aerobic exercise Low fat, low carb, high fiber diet recommended  Disposition:   FU in 1 year; will have f/u with lipid clinic   Signed, Larae Grooms, MD  12/16/2022 3:21 PM    [facility] [address], [place], [place]  [address] Phone: [phone]; Fax: [phone]

## 2022-12-16 ENCOUNTER — Encounter: Payer: Self-pay | Admitting: Interventional Cardiology

## 2022-12-16 ENCOUNTER — Ambulatory Visit: Payer: Medicare PPO | Attending: Interventional Cardiology | Admitting: Interventional Cardiology

## 2022-12-16 VITALS — BP 116/68 | HR 72 | Ht 62.0 in | Wt 152.8 lb

## 2022-12-16 DIAGNOSIS — E1159 Type 2 diabetes mellitus with other circulatory complications: Secondary | ICD-10-CM | POA: Diagnosis not present

## 2022-12-16 DIAGNOSIS — I25118 Atherosclerotic heart disease of native coronary artery with other forms of angina pectoris: Secondary | ICD-10-CM | POA: Diagnosis not present

## 2022-12-16 DIAGNOSIS — E782 Mixed hyperlipidemia: Secondary | ICD-10-CM | POA: Diagnosis not present

## 2022-12-16 DIAGNOSIS — I10 Essential (primary) hypertension: Secondary | ICD-10-CM | POA: Diagnosis not present

## 2022-12-16 DIAGNOSIS — G4730 Sleep apnea, unspecified: Secondary | ICD-10-CM

## 2022-12-16 DIAGNOSIS — I252 Old myocardial infarction: Secondary | ICD-10-CM

## 2022-12-16 DIAGNOSIS — R400 Somnolence: Secondary | ICD-10-CM

## 2022-12-16 NOTE — Patient Instructions (Addendum)
Medication Instructions:  Your physician recommends that you continue on your current medications as directed. Please refer to the Current Medication list given to you today.  *If you need a refill on your cardiac medications before your next appointment, please call your pharmacy*   Lab Work: Lab work to be done today--Lipids and direct LDL If you have labs (blood work) drawn today and your tests are completely normal, you will receive your results only by: [place] (if you have MyChart) OR A paper copy in the mail If you have any lab test that is abnormal or we need to change your treatment, we will call you to review the results.   Testing/Procedures: Your physician has recommended that you have a sleep study. This test records several body functions during sleep, including: brain activity, eye movement, oxygen and carbon dioxide blood levels, heart rate and rhythm, breathing rate and rhythm, the flow of air through your mouth and nose, snoring, body muscle movements, and chest and belly movement.    Follow-Up: At [facility], you and your health needs are our priority.  As part of our continuing mission to provide you with exceptional heart care, we have created designated Provider Care Teams.  These Care Teams include your primary Cardiologist (physician) and Advanced Practice Providers (APPs -  Physician Assistants and Nurse Practitioners) who all work together to provide you with the care you need, when you need it.  We recommend signing up for the patient portal called "MyChart".  Sign up information is provided on this After Visit Summary.  MyChart is used to connect with patients for Virtual Visits (Telemedicine).  Patients are able to view lab/test results, encounter notes, upcoming appointments, etc.  Non-urgent messages can be sent to your provider as well.   To learn more about what you can do with MyChart, go to [email or web address].    Your next  appointment:   July 14, 2023 at 2:00  Provider:   Larae Grooms, MD     Other Instructions

## 2022-12-17 ENCOUNTER — Encounter: Payer: Self-pay | Admitting: Pharmacist

## 2022-12-17 ENCOUNTER — Telehealth: Payer: Self-pay | Admitting: Pharmacist

## 2022-12-17 LAB — LIPID PANEL
Chol/HDL Ratio: 5.4 ratio — ABNORMAL HIGH (ref 0.0–4.4)
Cholesterol, Total: 205 mg/dL — ABNORMAL HIGH (ref 100–199)
HDL: 38 mg/dL — ABNORMAL LOW (ref >39)
LDL Chol Calc (NIH): 135 mg/dL — ABNORMAL HIGH (ref 0–99)
Triglycerides: 179 mg/dL — ABNORMAL HIGH (ref 0–149)
VLDL Cholesterol Cal: 32 mg/dL (ref 5–40)

## 2022-12-17 LAB — LDL CHOLESTEROL, DIRECT: LDL Direct: 137 mg/dL — ABNORMAL HIGH (ref 0–99)

## 2022-12-17 NOTE — Telephone Encounter (Signed)
Lipids checked on Praluent '150mg'$  Q2W, Nexletol '180mg'$  daily, and Vascepa 2g BID.   Previously tried Repatha which was ineffective at lowering her cholesterol.  Intolerant to:  atorvastatin '20mg'$  and '40mg'$  daily pitavastatin '2mg'$  daily Zetia '10mg'$  daily Myalgias with all and CK elevations > 10x ULN on statins including rechallenges   TG slightly elevated at 179 but pt not fasting, LDL elevated at 135 (direct LDL 137).  Her baseline LDL has been as high as 240 and has been as low as 91 on her current meds based on varying compliance.  Spoke with pt, she reports adherence to her Praluent and Nexletol but thinks she's only been taking 2 Vascepa capsules each day. She's aware to increase this to 2 capsules twice daily. We're limited on other cholesterol medication options since she's already maxed out on Praluent dosing and takes Nexletol. With prior CK elevation on multiple statins, would be hesitant to rechallenge, and is intolerant to Zetia as well. Leqvio would not be more effective than her Praluent. Encouraged compliance with her current medications, heart healthy-diet, and increasing physical activity. Pt was appreciative for the call.

## 2022-12-29 DIAGNOSIS — M5136 Other intervertebral disc degeneration, lumbar region: Secondary | ICD-10-CM | POA: Diagnosis not present

## 2022-12-29 DIAGNOSIS — Z79899 Other long term (current) drug therapy: Secondary | ICD-10-CM | POA: Diagnosis not present

## 2022-12-29 DIAGNOSIS — E663 Overweight: Secondary | ICD-10-CM | POA: Diagnosis not present

## 2022-12-29 DIAGNOSIS — M25561 Pain in right knee: Secondary | ICD-10-CM | POA: Diagnosis not present

## 2022-12-29 DIAGNOSIS — Z6828 Body mass index (BMI) 28.0-28.9, adult: Secondary | ICD-10-CM | POA: Diagnosis not present

## 2022-12-29 DIAGNOSIS — M3322 Polymyositis with myopathy: Secondary | ICD-10-CM | POA: Diagnosis not present

## 2022-12-29 DIAGNOSIS — J948 Other specified pleural conditions: Secondary | ICD-10-CM | POA: Diagnosis not present

## 2023-01-11 NOTE — Progress Notes (Unsigned)
[facility]oc[name] [address] [place] Phone: [phone] Subjective:   [name], am serving as a scribe for Dr. Hulan Saas.  I'm seeing this patient by the request  of:  Patient, No Pcp Per  CC: Bilateral knee pain  [id number]  08/11/2022 Chronic problem with exacerbation.  Having worsening left knee pain as well.  Bilateral injections given today.  Patient wants to hold on any type of replacement but will be considering it next year.  Discussed which activities to do and which ones to avoid.  Follow-up again in 12 weeks   Updated 01/12/2023 Jocelyn [name] is a 67 y.o. female coming in with complaint of B knee pain. Here for knee injections. Was attempting to see how long she could go in between injections       Past Medical History:  Diagnosis Date   Acute bronchitis    Allergic rhinitis, cause unspecified    Anemia    Anxiety    B12 deficiency    BV (bacterial vaginosis) 06/22/1996   Calculus of kidney    Calculus of kidney    Coronary atherosclerosis of unspecified type of vessel, native or graft    Dizziness    Essential hypertension, benign    Fibroid 2003   Fibromyalgia    H/O dysmenorrhea 2008   H/O varicella    Headache(784.0)    frequently   HSV-2 infection 2009   Hyperplastic colon polyp 05/16/2014   Irritable bowel syndrome    Meniere's disease, unspecified    Menses, irregular 2003   Myalgia and myositis, unspecified    Myocardial infarction ([facility])    Obstructive sleep apnea (adult) (pediatric)    Perimenopausal symptoms 2003   Pure hypercholesterolemia    Sarcoidosis    Type II or unspecified type diabetes mellitus without mention of complication, not stated as uncontrolled    Unspecified venous (peripheral) insufficiency    Vitamin D deficiency    Vulvitis 2010   Yeast infection    Past Surgical History:  Procedure Laterality Date   COLONOSCOPY     CORONARY STENT INTERVENTION N/A 06/15/2017    Procedure: CORONARY STENT INTERVENTION;  Surgeon: Jettie Booze, MD;  Location: [facility];  Service: Cardiovascular;  Laterality: N/A;   heart catherization     LEFT HEART CATH AND CORONARY ANGIOGRAPHY N/A 06/15/2017   Procedure: LEFT HEART CATH AND CORONARY ANGIOGRAPHY;  Surgeon: Jettie Booze, MD;  Location: [facility];  Service: Cardiovascular;  Laterality: N/A;   TONSILLECTOMY     Social History   Socioeconomic History   Marital status: Single    Spouse name: Not on file   Number of children: 1   Years of education: Not on file   Highest education level: Not on file  Occupational History   Occupation: Retired Pharmacist, hospital   Tobacco Use   Smoking status: Never   Smokeless tobacco: Never   Tobacco comments:    Daily Caffeine - 1  Exercise 2-3 times/weekly  Vaping Use   Vaping Use: Never used  Substance and Sexual Activity   Alcohol use: No    Alcohol/week: 0.0 standard drinks of alcohol   Drug use: No   Sexual activity: Yes    Birth control/protection: None  Other Topics Concern   Not on file  Social History Narrative   Exercises 2-3 times weekly   Caffeine Use: 1 daily (tea or Pepsi)   Lives alone in a one  story home.  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     Social Determinants of Health   Financial Resource Strain: Not on file  Food Insecurity: Not on file  Transportation Needs: Not on file  Physical Activity: Not on file  Stress: Not on file  Social Connections: Not on file   Allergies  Allergen Reactions   Actos [Pioglitazone] Other (See Comments)    REACTION: pt states INTOL w/ edema   Codeine Other (See Comments)    REACTION: vomiting   Januvia [Sitagliptin] Nausea Only   Lactose Intolerance (Gi) Diarrhea   Morphine Nausea Only and Nausea And Vomiting    REACTION: vomiting REACTION: vomiting   Repatha [Evolocumab]     WAS NOT EFFECTIVE IN LOWERING LDL   Family History  Adopted: Yes  Problem Relation Age of Onset    Other Other        ADOPTED   Colon cancer Neg Hx    Esophageal cancer Neg Hx    Rectal cancer Neg Hx    Stomach cancer Neg Hx     Current Outpatient Medications (Endocrine & Metabolic):    Empagliflozin-metFORMIN HCl ER 25-1000 MG TB24, Take 1 tablet by mouth daily.   TRULICITY [id number] SOPN, Inject 1 mL into the skin once a week.  Current Outpatient Medications (Cardiovascular):    Bempedoic Acid (NEXLETOL) 180 MG TABS, Take 1 tablet by mouth daily.   diltiazem (CARDIZEM CD) 240 MG 24 hr capsule, TAKE 1 CAPSULE(240 MG) BY MOUTH DAILY   losartan (COZAAR) 100 MG tablet, TAKE 1 TABLET(100 MG) BY MOUTH DAILY   metoprolol tartrate (LOPRESSOR) 50 MG tablet, TAKE 1 TABLET(50 MG) BY MOUTH TWICE DAILY   nitroGLYCERIN (NITROSTAT) 0.4 MG SL tablet, Place 1 tablet (0.4 mg total) under the tongue every 5 (five) minutes as needed for chest pain.   PRALUENT 150 MG/ML SOAJ, INJECT 150 MG INTO THE SKIN EVERY 14 DAYS   spironolactone (ALDACTONE) 25 MG tablet, TAKE 1 TABLET BY MOUTH EVERY DAY   VASCEPA 1 g capsule, TAKE 2 CAPSULES(2 GRAMS) BY MOUTH TWICE DAILY   Current Outpatient Medications (Analgesics):    allopurinol (ZYLOPRIM) 100 MG tablet, Take 1 tablet by mouth daily as needed.  Current Outpatient Medications (Hematological):    clopidogrel (PLAVIX) 75 MG tablet, TAKE 1 TABLET BY MOUTH EVERY DAY  Current Outpatient Medications (Other):    ALPRAZolam (XANAX) 0.25 MG tablet, Take 1 tablet by mouth daily as needed.   azaTHIOprine (IMURAN) 50 MG tablet, Take 1 tablet by mouth daily.   GEMTESA 75 MG TABS, Take 1 tablet by mouth daily.   ONETOUCH VERIO test strip, CHECK BLOOD SUGAR TID AS DIRECTED   Reviewed prior external information including notes and imaging from  primary care provider As well as notes that were available from care everywhere and other healthcare systems.  Past medical history, social, surgical and family history all reviewed in electronic medical record.  No  pertanent information unless stated regarding to the chief complaint.   Review of Systems:  No headache, visual changes, nausea, vomiting, diarrhea, constipation, dizziness, abdominal pain, skin rash, fevers, chills, night sweats, weight loss, swollen lymph nodes, body aches, joint swelling, chest pain, shortness of breath, mood changes. POSITIVE muscle aches  Objective  Blood pressure 128/70, pulse 78, height '5\' 2"'$  (1.575 m), weight 155 lb (70.3 kg), SpO2 98 %.   General: No apparent distress alert and oriented x3 mood and affect normal, dressed appropriately.  HEENT: Pupils equal, extraocular movements intact  Respiratory: Patient's speak in full sentences and does not appear short of breath  Cardiovascular: No lower extremity edema, non tender, no erythema  Antalgic gait noted.  Bilateral knees do have crepitus noted.  Instability noted with valgus and varus force.  After informed written and verbal consent, patient was seated on exam table. Right knee was prepped with alcohol swab and utilizing anterolateral approach, patient's right knee space was injected with 4:1  marcaine 0.5%: Kenalog '40mg'$ /dL. Patient tolerated the procedure well without immediate complications.  After informed written and verbal consent, patient was seated on exam table. Left knee was prepped with alcohol swab and utilizing anterolateral approach, patient's left knee space was injected with 4:1  marcaine 0.5%: Kenalog '40mg'$ /dL. Patient tolerated the procedure well without immediate complications.    Impression and Recommendations:     The above documentation has been reviewed and is accurate and complete Lyndal Pulley, DO

## 2023-01-12 ENCOUNTER — Encounter: Payer: Self-pay | Admitting: Family Medicine

## 2023-01-12 ENCOUNTER — Ambulatory Visit: Payer: Medicare PPO | Admitting: Family Medicine

## 2023-01-12 VITALS — BP 128/70 | HR 78 | Ht 62.0 in | Wt 155.0 lb

## 2023-01-12 DIAGNOSIS — M170 Bilateral primary osteoarthritis of knee: Secondary | ICD-10-CM

## 2023-01-12 NOTE — Assessment & Plan Note (Signed)
Patient given injection and tolerated the procedure well, discussed icing regimen and home exercises.  Chronic problem with worsening symptoms.  Has been 5 months since we have done injections.  Patient still wants to avoid any type of surgical intervention if possible.  Follow-up with me again in 10 to 12 weeks for further evaluation

## 2023-01-12 NOTE — Patient Instructions (Signed)
Injections in knees today Good to see you! See you again in 10-12 weeks

## 2023-01-24 DIAGNOSIS — E782 Mixed hyperlipidemia: Secondary | ICD-10-CM | POA: Diagnosis not present

## 2023-01-24 DIAGNOSIS — E785 Hyperlipidemia, unspecified: Secondary | ICD-10-CM | POA: Diagnosis not present

## 2023-01-24 DIAGNOSIS — E1169 Type 2 diabetes mellitus with other specified complication: Secondary | ICD-10-CM | POA: Diagnosis not present

## 2023-01-24 DIAGNOSIS — G4700 Insomnia, unspecified: Secondary | ICD-10-CM | POA: Diagnosis not present

## 2023-01-24 DIAGNOSIS — I10 Essential (primary) hypertension: Secondary | ICD-10-CM | POA: Diagnosis not present

## 2023-01-24 DIAGNOSIS — Z6827 Body mass index (BMI) 27.0-27.9, adult: Secondary | ICD-10-CM | POA: Diagnosis not present

## 2023-02-01 ENCOUNTER — Other Ambulatory Visit: Payer: Self-pay | Admitting: Interventional Cardiology

## 2023-02-02 ENCOUNTER — Other Ambulatory Visit: Payer: Self-pay | Admitting: Interventional Cardiology

## 2023-02-03 MED ORDER — ICOSAPENT ETHYL 1 G PO CAPS: ORAL_CAPSULE | ORAL | 3 refills | Status: DC

## 2023-02-23 DIAGNOSIS — Z1231 Encounter for screening mammogram for malignant neoplasm of breast: Secondary | ICD-10-CM | POA: Diagnosis not present

## 2023-03-03 ENCOUNTER — Other Ambulatory Visit: Payer: Self-pay | Admitting: Interventional Cardiology

## 2023-03-07 DIAGNOSIS — E1169 Type 2 diabetes mellitus with other specified complication: Secondary | ICD-10-CM | POA: Diagnosis not present

## 2023-03-07 DIAGNOSIS — I10 Essential (primary) hypertension: Secondary | ICD-10-CM | POA: Diagnosis not present

## 2023-03-23 ENCOUNTER — Ambulatory Visit: Payer: Medicare PPO | Admitting: Family Medicine

## 2023-03-29 ENCOUNTER — Telehealth: Payer: Self-pay | Admitting: Interventional Cardiology

## 2023-03-29 NOTE — Telephone Encounter (Signed)
Calling to speak to the nurse about getting patient on a new medication. Please advise

## 2023-03-29 NOTE — Telephone Encounter (Signed)
I spoke with Jocelyn [name]. She is calling because statin is recommended due to patient's heart disease.  I let [name] know patient is  intolerant to pitavastatin and  atorvastatin and patient is taking Praluent

## 2023-03-31 DIAGNOSIS — M3322 Polymyositis with myopathy: Secondary | ICD-10-CM | POA: Diagnosis not present

## 2023-03-31 DIAGNOSIS — Z1211 Encounter for screening for malignant neoplasm of colon: Secondary | ICD-10-CM | POA: Diagnosis not present

## 2023-03-31 DIAGNOSIS — Z1212 Encounter for screening for malignant neoplasm of rectum: Secondary | ICD-10-CM | POA: Diagnosis not present

## 2023-04-02 ENCOUNTER — Other Ambulatory Visit: Payer: Self-pay | Admitting: Interventional Cardiology

## 2023-04-05 NOTE — Progress Notes (Unsigned)
[name] [address] [place] [address] Phone: [phone] Subjective:   Bruce Donath, am serving as a scribe for Dr. Antoine Primas.  I'm seeing this patient by the request  of:  Patient, No Pcp Per  CC: Bilateral knee pain  [id number]  01/12/2023 Patient given injection and tolerated the procedure well, discussed icing regimen and home exercises.  Chronic problem with worsening symptoms.  Has been 5 months since we have done injections.  Patient still wants to avoid any type of surgical intervention if possible.  Follow-up with me again in 10 to 12 weeks for further evaluation      Update 04/06/2023 Jocelyn [name] is a 67 y.o. female coming in with complaint of B knee pain. Patient states that she was suppose to come back at an earlier date but she was called into work. Injections were helpful but have worn off.        Past Medical History:  Diagnosis Date   Acute bronchitis    Allergic rhinitis, cause unspecified    Anemia    Anxiety    B12 deficiency    BV (bacterial vaginosis) 06/22/1996   Calculus of kidney    Calculus of kidney    Coronary atherosclerosis of unspecified type of vessel, native or graft    Dizziness    Essential hypertension, benign    Fibroid 2003   Fibromyalgia    H/O dysmenorrhea 2008   H/O varicella    Headache(784.0)    frequently   HSV-2 infection 2009   Hyperplastic colon polyp 05/16/2014   Irritable bowel syndrome    Meniere's disease, unspecified    Menses, irregular 2003   Myalgia and myositis, unspecified    Myocardial infarction ([facility])    Obstructive sleep apnea (adult) (pediatric)    Perimenopausal symptoms 2003   Pure hypercholesterolemia    Sarcoidosis    Type II or unspecified type diabetes mellitus without mention of complication, not stated as uncontrolled    Unspecified venous (peripheral) insufficiency    Vitamin D deficiency    Vulvitis 2010   Yeast infection    Past  Surgical History:  Procedure Laterality Date   COLONOSCOPY     CORONARY STENT INTERVENTION N/A 06/15/2017   Procedure: CORONARY STENT INTERVENTION;  Surgeon: Corky Crafts, MD;  Location: [facility];  Service: Cardiovascular;  Laterality: N/A;   heart catherization     LEFT HEART CATH AND CORONARY ANGIOGRAPHY N/A 06/15/2017   Procedure: LEFT HEART CATH AND CORONARY ANGIOGRAPHY;  Surgeon: Corky Crafts, MD;  [facility]: [facility];  Service: Cardiovascular;  Laterality: N/A;   TONSILLECTOMY     Social History   Socioeconomic History   Marital status: Single    Spouse name: Not on file   Number of children: 1   Years of education: Not on file   Highest education level: Not on file  Occupational History   Occupation: Retired Runner, broadcasting/film/video   Tobacco Use   Smoking status: Never   Smokeless tobacco: Never   Tobacco comments:    Daily Caffeine - 1  Exercise 2-3 times/weekly  Vaping Use   Vaping Use: Never used  Substance and Sexual Activity   Alcohol use: No    Alcohol/week: 0.0 standard drinks of alcohol   Drug use: No   Sexual activity: Yes    Birth control/protection: None  Other Topics Concern   Not on file  Social History Narrative   Exercises  2-3 times weekly   Caffeine Use: 1 daily (tea or Pepsi)   Lives alone in a one story home.  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     Social Determinants of Health   Financial Resource Strain: Not on file  Food Insecurity: Not on file  Transportation Needs: Not on file  Physical Activity: Not on file  Stress: Not on file  Social Connections: Not on file   Allergies  Allergen Reactions   Actos [Pioglitazone] Other (See Comments)    REACTION: pt states INTOL w/ edema   Codeine Other (See Comments)    REACTION: vomiting   Januvia [Sitagliptin] Nausea Only   Lactose Intolerance (Gi) Diarrhea   Morphine Nausea Only and Nausea And Vomiting    REACTION: vomiting REACTION: vomiting   Repatha [Evolocumab]      WAS NOT EFFECTIVE IN LOWERING LDL   Family History  Adopted: Yes  Problem Relation Age of Onset   Other Other        ADOPTED   Colon cancer Neg Hx    Esophageal cancer Neg Hx    Rectal cancer Neg Hx    Stomach cancer Neg Hx     Current Outpatient Medications (Endocrine & Metabolic):    Empagliflozin-metFORMIN HCl ER 25-1000 MG TB24, Take 1 tablet by mouth daily.   TRULICITY 0.75 MG/0.5ML SOPN, Inject 1 mL into the skin once a week.  Current Outpatient Medications (Cardiovascular):    Bempedoic Acid (NEXLETOL) 180 MG TABS, Take 1 tablet by mouth daily.   diltiazem (CARDIZEM CD) 240 MG 24 hr capsule, TAKE 1 CAPSULE(240 MG) BY MOUTH DAILY   icosapent Ethyl (VASCEPA) 1 g capsule, TAKE 2 CAPSULES(2 GRAMS) BY MOUTH TWICE DAILY   losartan (COZAAR) 100 MG tablet, TAKE 1 TABLET(100 MG) BY MOUTH DAILY   metoprolol tartrate (LOPRESSOR) 50 MG tablet, TAKE 1 TABLET(50 MG) BY MOUTH TWICE DAILY   nitroGLYCERIN (NITROSTAT) 0.4 MG SL tablet, Place 1 tablet (0.4 mg total) under the tongue every 5 (five) minutes as needed for chest pain.   PRALUENT 150 MG/ML SOAJ, INJECT 150 MG INTO THE SKIN EVERY 14 DAYS   spironolactone (ALDACTONE) 25 MG tablet, TAKE 1 TABLET BY MOUTH EVERY DAY   Current Outpatient Medications (Analgesics):    allopurinol (ZYLOPRIM) 100 MG tablet, Take 1 tablet by mouth daily as needed.  Current Outpatient Medications (Hematological):    clopidogrel (PLAVIX) 75 MG tablet, TAKE 1 TABLET BY MOUTH EVERY DAY  Current Outpatient Medications (Other):    ALPRAZolam (XANAX) 0.25 MG tablet, Take 1 tablet by mouth daily as needed.   azaTHIOprine (IMURAN) 50 MG tablet, Take 1 tablet by mouth daily.   GEMTESA 75 MG TABS, Take 1 tablet by mouth daily.   ONETOUCH VERIO test strip, CHECK BLOOD SUGAR TID AS DIRECTED   Reviewed prior external information including notes and imaging from  primary care provider As well as notes that were available from care everywhere and other healthcare  systems.  Past medical history, social, surgical and family history all reviewed in electronic medical record.  No pertanent information unless stated regarding to the chief complaint.   Review of Systems:  No headache, visual changes, nausea, vomiting, diarrhea, constipation, dizziness, abdominal pain, skin rash, fevers, chills, night sweats, weight loss, swollen lymph nodes, body aches, chest pain, shortness of breath, mood changes. POSITIVE muscle aches, joint swelling  Objective  Blood pressure (!) 90/50, pulse 66, height 5\' 2"  (1.575 m), weight 155 lb (70.3 kg), SpO2 95 %.  General: No apparent distress alert and oriented x3 mood and affect normal, dressed appropriately.  HEENT: Pupils equal, extraocular movements intact  Respiratory: Patient's speak in full sentences and does not appear short of breath  Cardiovascular: No lower extremity edema, non tender, no erythema  Antalgic gait noted.  Patient does have tenderness to palpation of the knees bilaterally.  Crepitus noted with range of motion.  Trace effusion noted of the right knee.  After informed written and verbal consent, patient was seated on exam table. Right knee was prepped with alcohol swab and utilizing anterolateral approach, patient's right knee space was injected with 4:1  marcaine 0.5%: Kenalog 40mg /dL. Patient tolerated the procedure well without immediate complications.  After informed written and verbal consent, patient was seated on exam table. Left knee was prepped with alcohol swab and utilizing anterolateral approach, patient's left knee space was injected with 4:1  marcaine 0.5%: Kenalog 40mg /dL. Patient tolerated the procedure well without immediate complications.    Impression and Recommendations:     The above documentation has been reviewed and is accurate and complete Judi Saa, DO

## 2023-04-06 ENCOUNTER — Ambulatory Visit: Payer: Medicare PPO | Admitting: Family Medicine

## 2023-04-06 VITALS — BP 90/50 | HR 66 | Ht 62.0 in | Wt 155.0 lb

## 2023-04-06 DIAGNOSIS — M170 Bilateral primary osteoarthritis of knee: Secondary | ICD-10-CM

## 2023-04-06 NOTE — Progress Notes (Deleted)
[name] [address] [place] [address] Phone: [phone] Subjective:    I'm seeing this patient by the request  of:  Patient, No Pcp Per  CC:   [id number]  01/12/2023 Patient given injection and tolerated the procedure well, discussed icing regimen and home exercises. Chronic problem with worsening symptoms. Has been 5 months since we have done injections. Patient still wants to avoid any type of surgical intervention if possible. Follow-up with me again in 10 to 12 weeks for further evaluation   Update 04/07/2023 Jocelyn [name] is a 67 y.o. female coming in with complaint of B knee pain. Patient states      Past Medical History:  Diagnosis Date   Acute bronchitis    Allergic rhinitis, cause unspecified    Anemia    Anxiety    B12 deficiency    BV (bacterial vaginosis) 06/22/1996   Calculus of kidney    Calculus of kidney    Coronary atherosclerosis of unspecified type of vessel, native or graft    Dizziness    Essential hypertension, benign    Fibroid 2003   Fibromyalgia    H/O dysmenorrhea 2008   H/O varicella    Headache(784.0)    frequently   HSV-2 infection 2009   Hyperplastic colon polyp 05/16/2014   Irritable bowel syndrome    Meniere's disease, unspecified    Menses, irregular 2003   Myalgia and myositis, unspecified    Myocardial infarction ([facility])    Obstructive sleep apnea (adult) (pediatric)    Perimenopausal symptoms 2003   Pure hypercholesterolemia    Sarcoidosis    Type II or unspecified type diabetes mellitus without mention of complication, not stated as uncontrolled    Unspecified venous (peripheral) insufficiency    Vitamin D deficiency    Vulvitis 2010   Yeast infection    Past Surgical History:  Procedure Laterality Date   COLONOSCOPY     CORONARY STENT INTERVENTION N/A 06/15/2017   Procedure: CORONARY STENT INTERVENTION;  Surgeon: Corky Crafts, MD;  Location: [facility];  Service:  Cardiovascular;  Laterality: N/A;   heart catherization     LEFT HEART CATH AND CORONARY ANGIOGRAPHY N/A 06/15/2017   Procedure: LEFT HEART CATH AND CORONARY ANGIOGRAPHY;  Surgeon: Corky Crafts, MD;  Location: [facility];  Service: Cardiovascular;  Laterality: N/A;   TONSILLECTOMY     Social History   Socioeconomic History   Marital status: Single    Spouse name: Not on file   Number of children: 1   Years of education: Not on file   Highest education level: Not on file  Occupational History   Occupation: Retired Runner, broadcasting/film/video   Tobacco Use   Smoking status: Never   Smokeless tobacco: Never   Tobacco comments:    Daily Caffeine - 1  Exercise 2-3 times/weekly  Vaping Use   Vaping Use: Never used  Substance and Sexual Activity   Alcohol use: No    Alcohol/week: 0.0 standard drinks of alcohol   Drug use: No   Sexual activity: Yes    Birth control/protection: None  Other Topics Concern   Not on file  Social History Narrative   Exercises 2-3 times weekly   Caffeine Use: 1 daily (tea or Pepsi)   Lives alone in a one story home.  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     Social Determinants of Health   Financial Resource Strain: Not on file  Food Insecurity:  Not on file  Transportation Needs: Not on file  Physical Activity: Not on file  Stress: Not on file  Social Connections: Not on file   Allergies  Allergen Reactions   Actos [Pioglitazone] Other (See Comments)    REACTION: pt states INTOL w/ edema   Codeine Other (See Comments)    REACTION: vomiting   Januvia [Sitagliptin] Nausea Only   Lactose Intolerance (Gi) Diarrhea   Morphine Nausea Only and Nausea And Vomiting    REACTION: vomiting REACTION: vomiting   Repatha [Evolocumab]     WAS NOT EFFECTIVE IN LOWERING LDL   Family History  Adopted: Yes  Problem Relation Age of Onset   Other Other        ADOPTED   Colon cancer Neg Hx    Esophageal cancer Neg Hx    Rectal cancer Neg Hx    Stomach  cancer Neg Hx     Current Outpatient Medications (Endocrine & Metabolic):    Empagliflozin-metFORMIN HCl ER 25-1000 MG TB24, Take 1 tablet by mouth daily.   TRULICITY 0.75 MG/0.5ML SOPN, Inject 1 mL into the skin once a week.  Current Outpatient Medications (Cardiovascular):    Bempedoic Acid (NEXLETOL) 180 MG TABS, Take 1 tablet by mouth daily.   diltiazem (CARDIZEM CD) 240 MG 24 hr capsule, TAKE 1 CAPSULE(240 MG) BY MOUTH DAILY   icosapent Ethyl (VASCEPA) 1 g capsule, TAKE 2 CAPSULES(2 GRAMS) BY MOUTH TWICE DAILY   losartan (COZAAR) 100 MG tablet, TAKE 1 TABLET(100 MG) BY MOUTH DAILY   metoprolol tartrate (LOPRESSOR) 50 MG tablet, TAKE 1 TABLET(50 MG) BY MOUTH TWICE DAILY   nitroGLYCERIN (NITROSTAT) 0.4 MG SL tablet, Place 1 tablet (0.4 mg total) under the tongue every 5 (five) minutes as needed for chest pain.   PRALUENT 150 MG/ML SOAJ, INJECT 150 MG INTO THE SKIN EVERY 14 DAYS   spironolactone (ALDACTONE) 25 MG tablet, TAKE 1 TABLET BY MOUTH EVERY DAY   Current Outpatient Medications (Analgesics):    allopurinol (ZYLOPRIM) 100 MG tablet, Take 1 tablet by mouth daily as needed.  Current Outpatient Medications (Hematological):    clopidogrel (PLAVIX) 75 MG tablet, TAKE 1 TABLET BY MOUTH EVERY DAY  Current Outpatient Medications (Other):    ALPRAZolam (XANAX) 0.25 MG tablet, Take 1 tablet by mouth daily as needed.   azaTHIOprine (IMURAN) 50 MG tablet, Take 1 tablet by mouth daily.   GEMTESA 75 MG TABS, Take 1 tablet by mouth daily.   ONETOUCH VERIO test strip, CHECK BLOOD SUGAR TID AS DIRECTED   Reviewed prior external information including notes and imaging from  primary care provider As well as notes that were available from care everywhere and other healthcare systems.  Past medical history, social, surgical and family history all reviewed in electronic medical record.  No pertanent information unless stated regarding to the chief complaint.   Review of Systems:  No  headache, visual changes, nausea, vomiting, diarrhea, constipation, dizziness, abdominal pain, skin rash, fevers, chills, night sweats, weight loss, swollen lymph nodes, body aches, joint swelling, chest pain, shortness of breath, mood changes. POSITIVE muscle aches  Objective  There were no vitals taken for this visit.   General: No apparent distress alert and oriented x3 mood and affect normal, dressed appropriately.  HEENT: Pupils equal, extraocular movements intact  Respiratory: Patient's speak in full sentences and does not appear short of breath  Cardiovascular: No lower extremity edema, non tender, no erythema      Impression and Recommendations:

## 2023-04-06 NOTE — Assessment & Plan Note (Signed)
Repeat steroid injection for this chronic problem with worsening symptoms.  Still wants to avoid any type of surgical intervention.  Discussed with patient about icing regimen and home exercises.  Discussed potential viscosupplementation which patient did do back in 2023.  Will try to get approval.  Follow-up again in 6 to 8 weeks

## 2023-04-06 NOTE — Patient Instructions (Signed)
Injection in knees today See you again in 6-8 weeks

## 2023-04-07 ENCOUNTER — Ambulatory Visit: Payer: Medicare PPO | Admitting: Family Medicine

## 2023-04-08 LAB — COLOGUARD: COLOGUARD: NEGATIVE

## 2023-04-08 LAB — EXTERNAL GENERIC LAB PROCEDURE: COLOGUARD: NEGATIVE

## 2023-04-22 IMAGING — DX DG KNEE 3 VIEWS*R*
3 series · 3 of 3 positions shown · non-contrast
Comparison: None.

CLINICAL DATA: Right knee pain.

EXAM:
RIGHT KNEE - 3 VIEW

[knee ap]
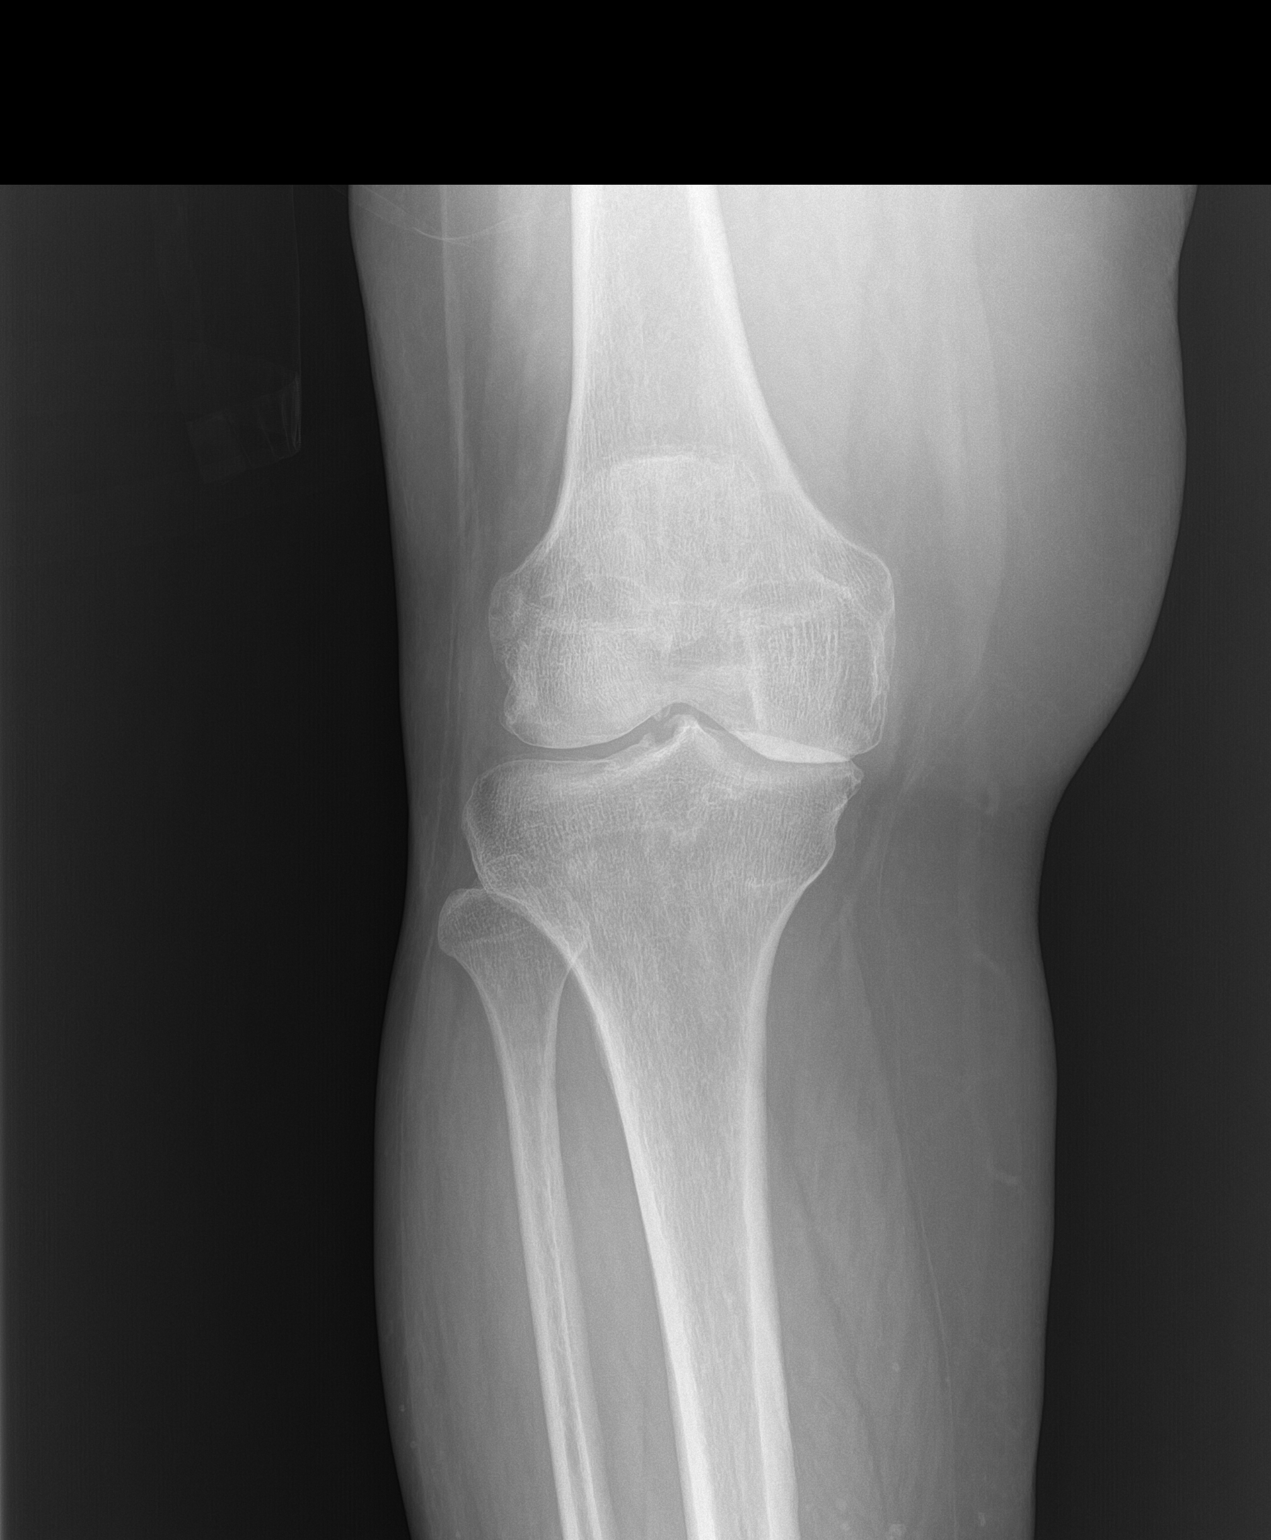

[knee lat]
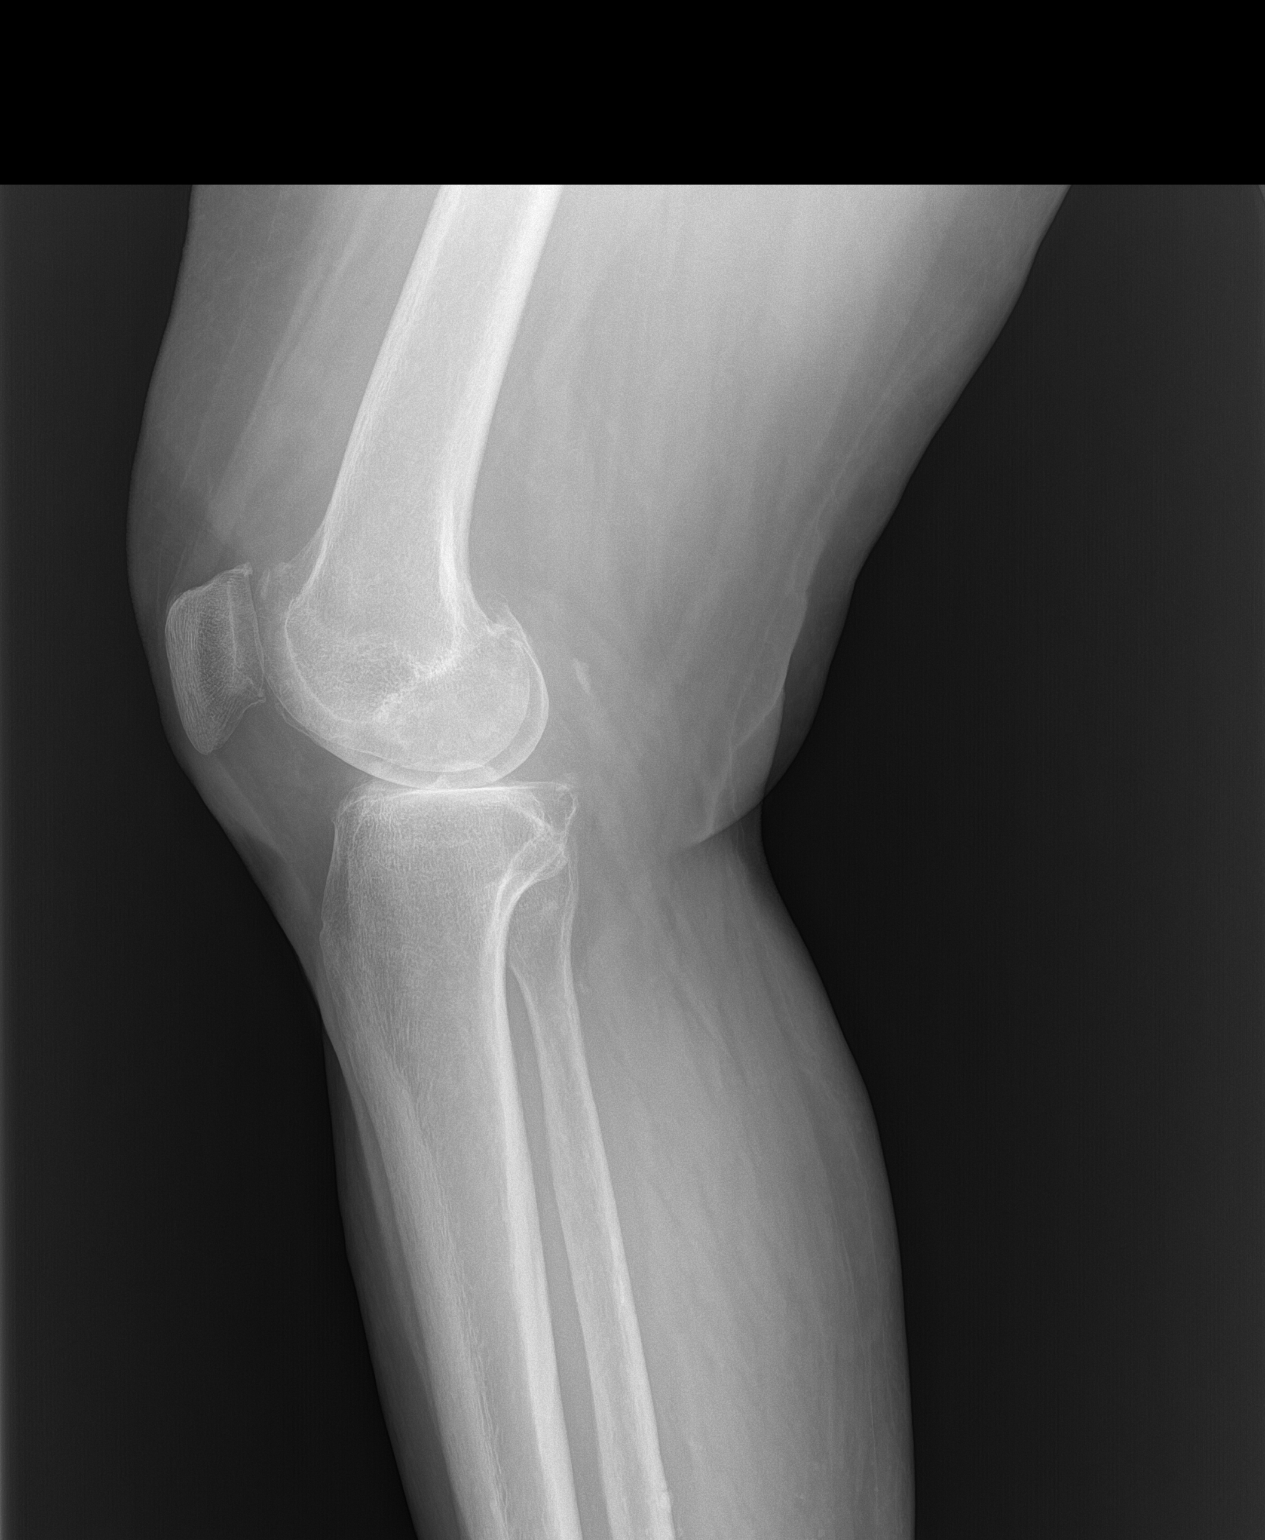

[patella]
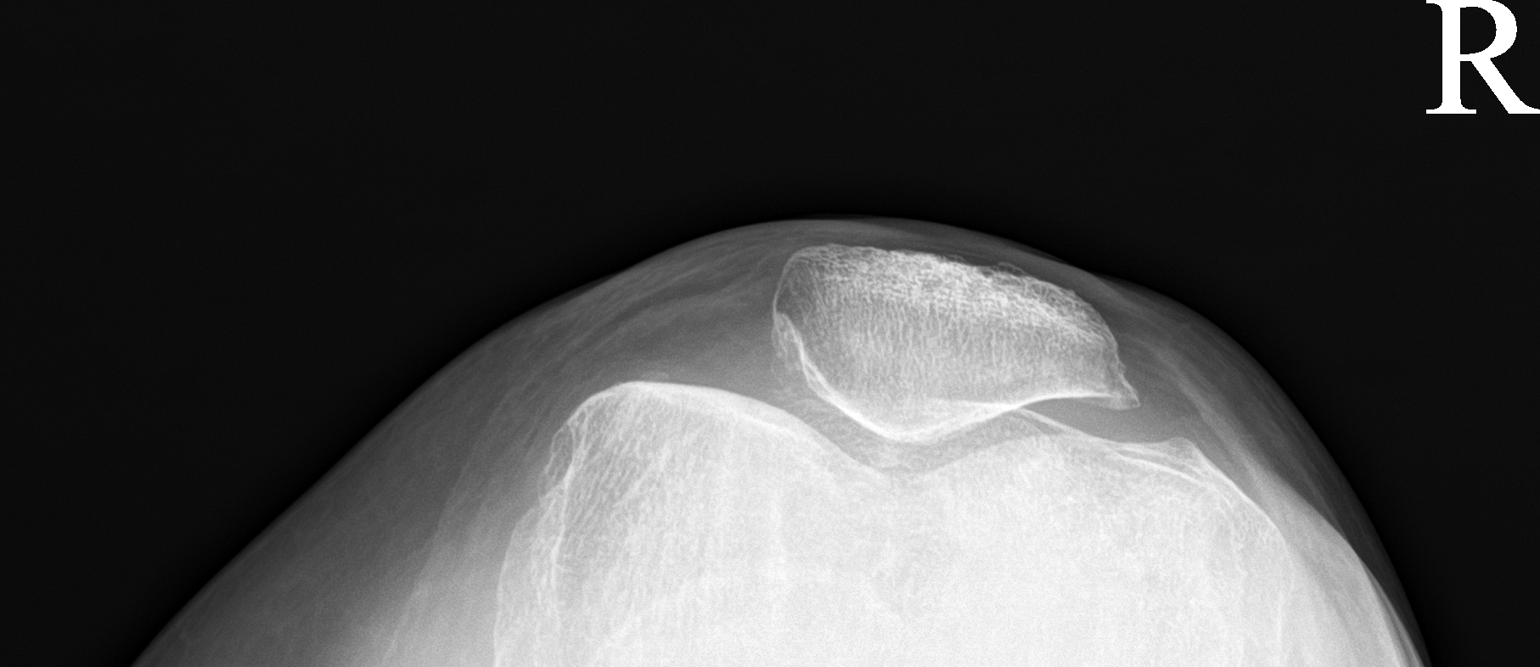

[3 of 3 positions shown; findings below may reference images not displayed]

FINDINGS: No evidence of fracture, dislocation, or joint effusion. Mild 3
compartment osteoarthritic changes. Soft tissues are unremarkable.
IMPRESSION: 1. No acute fracture or dislocation identified about the right knee.
2. Mild 3 compartment osteoarthritic changes.

## 2023-04-26 DIAGNOSIS — E782 Mixed hyperlipidemia: Secondary | ICD-10-CM | POA: Diagnosis not present

## 2023-04-26 DIAGNOSIS — I10 Essential (primary) hypertension: Secondary | ICD-10-CM | POA: Diagnosis not present

## 2023-04-26 DIAGNOSIS — E1169 Type 2 diabetes mellitus with other specified complication: Secondary | ICD-10-CM | POA: Diagnosis not present

## 2023-05-25 DIAGNOSIS — Z0001 Encounter for general adult medical examination with abnormal findings: Secondary | ICD-10-CM | POA: Diagnosis not present

## 2023-05-27 NOTE — Progress Notes (Signed)
[name] [address] [place] [address] Phone: [phone] Subjective:   INadine Counts, am serving as a scribe for Dr. Antoine Primas.  I'm seeing this patient by the request  of:  Patient, No Pcp Per  CC: bilateral knee pain   [id number]  04/06/2023 Repeat steroid injection for this chronic problem with worsening symptoms. Still wants to avoid any type of surgical intervention. Discussed with patient about icing regimen and home exercises. Discussed potential viscosupplementation which patient did do back in 2023. Will try to get approval. Follow-up again in 6 to 8 weeks   Updated 06/01/2023 Jocelyn [name] is a 67 y.o. female coming in with complaint of B knee pain. Knees about the same.      Past Medical History:  Diagnosis Date   Acute bronchitis    Allergic rhinitis, cause unspecified    Anemia    Anxiety    B12 deficiency    BV (bacterial vaginosis) 06/22/1996   Calculus of kidney    Calculus of kidney    Coronary atherosclerosis of unspecified type of vessel, native or graft    Dizziness    Essential hypertension, benign    Fibroid 2003   Fibromyalgia    H/O dysmenorrhea 2008   H/O varicella    Headache(784.0)    frequently   HSV-2 infection 2009   Hyperplastic colon polyp 05/16/2014   Irritable bowel syndrome    Meniere's disease, unspecified    Menses, irregular 2003   Myalgia and myositis, unspecified    Myocardial infarction (HCC)    Obstructive sleep apnea (adult) (pediatric)    Perimenopausal symptoms 2003   Pure hypercholesterolemia    Sarcoidosis    Type II or unspecified type diabetes mellitus without mention of complication, not stated as uncontrolled    Unspecified venous (peripheral) insufficiency    Vitamin D deficiency    Vulvitis 2010   Yeast infection    Past Surgical History:  Procedure Laterality Date   COLONOSCOPY     CORONARY STENT INTERVENTION N/A 06/15/2017   Procedure: CORONARY STENT  INTERVENTION;  Surgeon: Corky Crafts, MD;  Location: [facility] INVASIVE CV LAB;  Service: Cardiovascular;  Laterality: N/A;   heart catherization     LEFT HEART CATH AND CORONARY ANGIOGRAPHY N/A 06/15/2017   Procedure: LEFT HEART CATH AND CORONARY ANGIOGRAPHY;  Surgeon: Corky Crafts, MD;  Location: [facility] INVASIVE CV LAB;  Service: Cardiovascular;  Laterality: N/A;   TONSILLECTOMY     Social History   Socioeconomic History   Marital status: Single    Spouse name: Not on file   Number of children: 1   Years of education: Not on file   Highest education level: Not on file  Occupational History   Occupation: Retired Runner, broadcasting/film/video   Tobacco Use   Smoking status: Never   Smokeless tobacco: Never   Tobacco comments:    Daily Caffeine - 1  Exercise 2-3 times/weekly  Vaping Use   Vaping status: Never Used  Substance and Sexual Activity   Alcohol use: No    Alcohol/week: 0.0 standard drinks of alcohol   Drug use: No   Sexual activity: Yes    Birth control/protection: None  Other Topics Concern   Not on file  Social History Narrative   Exercises 2-3 times weekly   Caffeine Use: 1 daily (tea or Pepsi)   Lives alone in a one story home.  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  Social Determinants of Health   Financial Resource Strain: Not on file  Food Insecurity: Not on file  Transportation Needs: Not on file  Physical Activity: Not on file  Stress: Not on file  Social Connections: Not on file   Allergies  Allergen Reactions   Actos [Pioglitazone] Other (See Comments)    REACTION: pt states INTOL w/ edema   Codeine Other (See Comments)    REACTION: vomiting   Januvia [Sitagliptin] Nausea Only   Lactose Intolerance (Gi) Diarrhea   Morphine Nausea Only and Nausea And Vomiting    REACTION: vomiting REACTION: vomiting   Repatha [Evolocumab]     WAS NOT EFFECTIVE IN LOWERING LDL   Family History  Adopted: Yes  Problem Relation Age of Onset   Other Other        ADOPTED    Colon cancer Neg Hx    Esophageal cancer Neg Hx    Rectal cancer Neg Hx    Stomach cancer Neg Hx     Current Outpatient Medications (Endocrine & Metabolic):    Empagliflozin-metFORMIN HCl ER 25-1000 MG TB24, Take 1 tablet by mouth daily.   TRULICITY 0.75 MG/0.5ML SOPN, Inject 1 mL into the skin once a week.  Current Outpatient Medications (Cardiovascular):    Bempedoic Acid (NEXLETOL) 180 MG TABS, Take 1 tablet by mouth daily.   diltiazem (CARDIZEM CD) 240 MG 24 hr capsule, TAKE 1 CAPSULE(240 MG) BY MOUTH DAILY   icosapent Ethyl (VASCEPA) 1 g capsule, TAKE 2 CAPSULES(2 GRAMS) BY MOUTH TWICE DAILY   losartan (COZAAR) 100 MG tablet, TAKE 1 TABLET(100 MG) BY MOUTH DAILY   metoprolol tartrate (LOPRESSOR) 50 MG tablet, TAKE 1 TABLET(50 MG) BY MOUTH TWICE DAILY   nitroGLYCERIN (NITROSTAT) 0.4 MG SL tablet, Place 1 tablet (0.4 mg total) under the tongue every 5 (five) minutes as needed for chest pain.   PRALUENT 150 MG/ML SOAJ, INJECT 150 MG INTO THE SKIN EVERY 14 DAYS   spironolactone (ALDACTONE) 25 MG tablet, TAKE 1 TABLET BY MOUTH EVERY DAY   Current Outpatient Medications (Analgesics):    allopurinol (ZYLOPRIM) 100 MG tablet, Take 1 tablet by mouth daily as needed.  Current Outpatient Medications (Hematological):    clopidogrel (PLAVIX) 75 MG tablet, TAKE 1 TABLET BY MOUTH EVERY DAY  Current Outpatient Medications (Other):    ALPRAZolam (XANAX) 0.25 MG tablet, Take 1 tablet by mouth daily as needed.   azaTHIOprine (IMURAN) 50 MG tablet, Take 1 tablet by mouth daily.   GEMTESA 75 MG TABS, Take 1 tablet by mouth daily.   ONETOUCH VERIO test strip, CHECK BLOOD SUGAR TID AS DIRECTED   Reviewed prior external information including notes and imaging from  primary care provider As well as notes that were available from care everywhere and other healthcare systems.  Past medical history, social, surgical and family history all reviewed in electronic medical record.  No pertanent  information unless stated regarding to the chief complaint.   Review of Systems:  No headache, visual changes, nausea, vomiting, diarrhea, constipation, dizziness, abdominal pain, skin rash, fevers, chills, night sweats, weight loss, swollen lymph nodes, body aches, joint swelling, chest pain, shortness of breath, mood changes. POSITIVE muscle aches  Objective  Blood pressure 112/66, pulse 77, height 5\' 2"  (1.575 m), weight 159 lb (72.1 kg), SpO2 98%.   General: No apparent distress alert and oriented x3 mood and affect normal, dressed appropriately.  HEENT: Pupils equal, extraocular movements intact  Respiratory: Patient's speak in full sentences and does not appear short  of breath  Cardiovascular: No lower extremity edema, non tender, no erythema  Antalgic gait noted.  Patient does have instability noted.  Trace effusion noted bilaterally.  After informed written and verbal consent, patient was seated on exam table. Right knee was prepped with alcohol swab and utilizing anterolateral approach, patient's right knee space was injected with 48 mg per 3 mL of Monovisc (sodium hyaluronate) in a prefilled syringe was injected easily into the knee through a 22-gauge needle..Patient tolerated the procedure well without immediate complications.  After informed written and verbal consent, patient was seated on exam table. Left knee was prepped with alcohol swab and utilizing anterolateral approach, patient's left knee space was injected with 48 mg per 3 mL of Monovisc (sodium hyaluronate) in a prefilled syringe was injected easily into the knee through a 22-gauge needle..Patient tolerated the procedure well without immediate complications.    Impression and Recommendations:    The above documentation has been reviewed and is accurate and complete Judi Saa, DO

## 2023-06-01 ENCOUNTER — Encounter: Payer: Self-pay | Admitting: Family Medicine

## 2023-06-01 ENCOUNTER — Ambulatory Visit: Payer: Medicare PPO | Admitting: Family Medicine

## 2023-06-01 VITALS — BP 112/66 | HR 77 | Ht 62.0 in | Wt 159.0 lb

## 2023-06-01 DIAGNOSIS — M170 Bilateral primary osteoarthritis of knee: Secondary | ICD-10-CM | POA: Diagnosis not present

## 2023-06-01 MED ORDER — HYALURONAN 88 MG/4ML IX SOSY
176.0000 mg | PREFILLED_SYRINGE | Freq: Once | INTRA_ARTICULAR | Status: AC
Start: 2023-06-01 — End: 2023-06-01
  Administered 2023-06-01: 176 mg via INTRA_ARTICULAR

## 2023-06-01 NOTE — Assessment & Plan Note (Signed)
Chronic problem with exacerbation.  Was given viscosupplementation as well.  Increase activity slowly otherwise.

## 2023-06-01 NOTE — Patient Instructions (Signed)
Good to see you! See you again in 3 months just in case

## 2023-07-06 DIAGNOSIS — Z6829 Body mass index (BMI) 29.0-29.9, adult: Secondary | ICD-10-CM | POA: Diagnosis not present

## 2023-07-06 DIAGNOSIS — J948 Other specified pleural conditions: Secondary | ICD-10-CM | POA: Diagnosis not present

## 2023-07-06 DIAGNOSIS — M25561 Pain in right knee: Secondary | ICD-10-CM | POA: Diagnosis not present

## 2023-07-06 DIAGNOSIS — E663 Overweight: Secondary | ICD-10-CM | POA: Diagnosis not present

## 2023-07-06 DIAGNOSIS — M3322 Polymyositis with myopathy: Secondary | ICD-10-CM | POA: Diagnosis not present

## 2023-07-06 DIAGNOSIS — M5136 Other intervertebral disc degeneration, lumbar region: Secondary | ICD-10-CM | POA: Diagnosis not present

## 2023-07-06 DIAGNOSIS — Z79899 Other long term (current) drug therapy: Secondary | ICD-10-CM | POA: Diagnosis not present

## 2023-07-14 ENCOUNTER — Ambulatory Visit: Payer: Medicare PPO | Admitting: Interventional Cardiology

## 2023-07-20 ENCOUNTER — Other Ambulatory Visit: Payer: Self-pay | Admitting: Interventional Cardiology

## 2023-07-26 ENCOUNTER — Ambulatory Visit: Payer: Medicare PPO | Admitting: Interventional Cardiology

## 2023-07-27 DIAGNOSIS — M6281 Muscle weakness (generalized): Secondary | ICD-10-CM | POA: Diagnosis not present

## 2023-07-27 DIAGNOSIS — R262 Difficulty in walking, not elsewhere classified: Secondary | ICD-10-CM | POA: Diagnosis not present

## 2023-07-27 NOTE — Progress Notes (Unsigned)
Cardiology Office Note   Date:  07/28/2023   ID:  Jocelyn [name], DOB 01/25/1956, MRN [id number]  PCP:  Patient, No Pcp Per    No chief complaint on file.  CAD  Wt Readings from Last 3 Encounters:  07/28/23 152 lb 12.8 oz (69.3 kg)  06/01/23 159 lb (72.1 kg)  04/06/23 155 lb (70.3 kg)       History of Present Illness: Jocelyn [name] is a 67 y.o. female  With CAD.  Cath many years ago Showed branch vessel disease. She has had difficulty tolerating statins. Eventually, she was put on Repatha.   She had an inferior STEMI on 06/15/2017. She received a drug-eluting stent to her distal right coronary artery. She had mild LAD disease. Her diagonal vessel disease has persisted over the years. Of note, she had severe radial spasm and it was noted that she had subclavian artery lusoria variant which made right radial approach quite difficult. This anatomic abnormality had been noted on a prior CT scan done at [facility].  She had arm pain, heartburn, back pain when she had her MI.     Echo in 8/18 showed: Left ventricle: The cavity size was normal. There was mild   concentric hypertrophy. Systolic function was normal. The   estimated ejection fraction was in the range of 50% to 55%. Wall   motion was normal; there were no regional wall motion   abnormalities. Doppler parameters are consistent with abnormal   left ventricular relaxation (grade 1 diastolic dysfunction). - Aortic valve: There was no regurgitation. - Mitral valve: There was mild regurgitation. - Right ventricle: The cavity size was normal. Wall thickness was   normal. Systolic function was normal. - Atrial septum: No defect or patent foramen ovale was identified   by color flow Doppler. - Tricuspid valve: There was no regurgitation.   She has had issues with HTN and headaches.  THis seems to have resolved.   She had a pneumonia and pleural effusion in 10/18.  It was bloody.  Her Brilinta was stopped at that time  for thoracentesis.   Knee pain limits exercise. No prolonged walking due to knee.  Working at [employer]  Walks inside [employer].   In 2020,  She wore a BP monitor 24 hours, avg BP 138/79.    She recently felt fatigued and sluggish, worse with exertion.    Denies : Chest pain. Dizziness. Leg edema. Nitroglycerin use. Orthopnea. Palpitations. Paroxysmal nocturnal dyspnea. Shortness of breath. Syncope.    She does PT for muscle strength.  Knee pain still limits walking.  She has tried NTG without any significant change.   Denies : Chest pain. Dizziness. Leg edema. Nitroglycerin use. Orthopnea. Palpitations. Paroxysmal nocturnal dyspnea. Shortness of breath. Syncope.     Past Medical History:  Diagnosis Date   Acute bronchitis    Allergic rhinitis, cause unspecified    Anemia    Anxiety    B12 deficiency    BV (bacterial vaginosis) 06/22/1996   Calculus of kidney    Calculus of kidney    Coronary atherosclerosis of unspecified type of vessel, native or graft    Dizziness    Essential hypertension, benign    Fibroid 2003   Fibromyalgia    H/O dysmenorrhea 2008   H/O varicella    Headache(784.0)    frequently   HSV-2 infection 2009   Hyperplastic colon polyp 05/16/2014   Irritable bowel syndrome    Meniere's disease, unspecified  Menses, irregular 2003   Myalgia and myositis, unspecified    Myocardial infarction (HCC)    Obstructive sleep apnea (adult) (pediatric)    Perimenopausal symptoms 2003   Pure hypercholesterolemia    Sarcoidosis    Type II or unspecified type diabetes mellitus without mention of complication, not stated as uncontrolled    Unspecified venous (peripheral) insufficiency    Vitamin D deficiency    Vulvitis 2010   Yeast infection     Past Surgical History:  Procedure Laterality Date   COLONOSCOPY     CORONARY STENT INTERVENTION N/A 06/15/2017   Procedure: CORONARY STENT INTERVENTION;  Surgeon: Corky Crafts, MD;  Location: [facility]  INVASIVE CV LAB;  [name]: Cardiovascular;  Laterality: N/A;   heart catherization     LEFT HEART CATH AND CORONARY ANGIOGRAPHY N/A 06/15/2017   Procedure: LEFT HEART CATH AND CORONARY ANGIOGRAPHY;  Surgeon: Corky Crafts, MD;  Location: [facility] INVASIVE CV LAB;  [name]: Cardiovascular;  Laterality: N/A;   TONSILLECTOMY       Current Outpatient Medications  Medication Sig Dispense Refill   allopurinol (ZYLOPRIM) 100 MG tablet Take 1 tablet by mouth daily as needed.     ALPRAZolam (XANAX) 0.5 MG tablet Take 0.5 mg by mouth 3 (three) times daily.     azaTHIOprine (IMURAN) 50 MG tablet Take 1 tablet by mouth daily.     clopidogrel (PLAVIX) 75 MG tablet TAKE 1 TABLET BY MOUTH EVERY DAY 90 tablet 2   diltiazem (CARDIZEM CD) 240 MG 24 hr capsule TAKE 1 CAPSULE(240 MG) BY MOUTH DAILY 90 capsule 1   Empagliflozin-metFORMIN HCl ER 25-1000 MG TB24 Take 1 tablet by mouth daily.     GEMTESA 75 MG TABS Take 1 tablet by mouth daily.     icosapent Ethyl (VASCEPA) 1 g capsule TAKE 2 CAPSULES(2 GRAMS) BY MOUTH TWICE DAILY 360 capsule 3   losartan (COZAAR) 100 MG tablet TAKE 1 TABLET(100 MG) BY MOUTH DAILY 90 tablet 3   methylPREDNISolone (MEDROL) 4 MG tablet Take 4 mg by mouth daily.     metoprolol tartrate (LOPRESSOR) 50 MG tablet TAKE 1 TABLET(50 MG) BY MOUTH TWICE DAILY 180 tablet 3   NEXLETOL 180 MG TABS TAKE 1 TABLET BY MOUTH DAILY 90 tablet 1   nitroGLYCERIN (NITROSTAT) 0.4 MG SL tablet Place 1 tablet (0.4 mg total) under the tongue every 5 (five) minutes as needed for chest pain. 25 tablet 2   ONETOUCH VERIO test strip CHECK BLOOD SUGAR TID AS DIRECTED     PRALUENT 150 MG/ML SOAJ INJECT 150 MG INTO THE SKIN EVERY 14 DAYS 6 mL 3   spironolactone (ALDACTONE) 25 MG tablet TAKE 1 TABLET BY MOUTH EVERY DAY 90 tablet 2   No current facility-administered medications for this visit.    Allergies:   Actos [pioglitazone], Codeine, Januvia [sitagliptin], Lactose intolerance (gi), Morphine, and Repatha  [evolocumab]    Social History:  The patient  reports that she has never smoked. She has never used smokeless tobacco. She reports that she does not drink alcohol and does not use drugs.   Family History:  The patient's family history includes Other in an other family member. She was adopted.    ROS:  Please see the history of present illness.   Otherwise, review of systems are positive for fatigue, knee pain.   All other systems are reviewed and negative.    PHYSICAL EXAM: VS:  BP 130/80   Pulse (!) 110   Ht 5\' 2"  (1.575 m)  Wt 152 lb 12.8 oz (69.3 kg)   SpO2 96%   BMI 27.95 kg/m  , BMI Body mass index is 27.95 kg/m. GEN: Well nourished, well developed, in no acute distress HEENT: normal Neck: no JVD, carotid bruits, or masses Cardiac: RRR; no murmurs, rubs, or gallops,no edema  Respiratory:  clear to auscultation bilaterally, normal work of breathing GI: soft, nontender, nondistended, + BS MS: no deformity or atrophy Skin: warm and dry, no rash Neuro:  Strength and sensation are intact Psych: euthymic mood, anxious affect   EKG:   The ekg ordered 12/2022 demonstrates NSR, nonspecific ST changes   Recent Labs: No results found for requested labs within last 365 days.   Lipid Panel    Component Value Date/Time   CHOL 205 (H) 12/16/2022 1608   TRIG 179 (H) 12/16/2022 1608   HDL 38 (L) 12/16/2022 1608   CHOLHDL 5.4 (H) 12/16/2022 1608   CHOLHDL 4 05/30/2018 1444   VLDL 36.2 05/30/2018 1444   LDLCALC 135 (H) 12/16/2022 1608   LDLDIRECT 137 (H) 12/16/2022 1608   LDLDIRECT 91.6 08/14/2014 0822     Other studies Reviewed: Additional studies/ records that were reviewed today with results demonstrating: LDL 118 in 01/2023.   ASSESSMENT AND PLAN:  CAD/Old MI: Vague symptoms that are somewhat similar to what she had back in 2018 prior to MI, not as severe.  THis has been a frequent pattern for her over the years and we have reassured her.  Knee pain has limited her  building up her stamina.  Since it has been nearly 5 years since stress test, will repeat nuclear scan.  Continue aspirin and Plavix.  No bleeding issues. Hyperlipidemia: Continue Praluent, Nexletol and Vascepa.  Managed in the Pharm.[facility] lipid clinic.  Intolerant of statins. DM2 A1C 5.9.  Well-controlled.  Continue current management. HTN: The current medical regimen is effective;  continue present plan and medications. Overweight: Whole food, plant based diet.  High-fiber diet OSA: Noted in the past but not discussed today.   Current medicines are reviewed at length with the patient today.  The patient concerns regarding her medicines were addressed.  The following changes have been made:  No change  Labs/ tests ordered today include:  No orders of the defined types were placed in this encounter.   Recommend 150 minutes/week of aerobic exercise Low fat, low carb, high fiber diet recommended  Disposition:   FU in 6 months with Drs [place], CMac or Stepney; or sooner based on stress test   Signed, Lance Muss, MD  07/28/2023 3:13 PM    [facility] [address] [place], [place], [place]  [address] Phone: [phone]; Fax: [phone]

## 2023-07-28 ENCOUNTER — Encounter: Payer: Self-pay | Admitting: Interventional Cardiology

## 2023-07-28 ENCOUNTER — Encounter: Payer: Self-pay | Admitting: *Deleted

## 2023-07-28 ENCOUNTER — Ambulatory Visit: Payer: Medicare PPO | Attending: Interventional Cardiology | Admitting: Interventional Cardiology

## 2023-07-28 VITALS — BP 130/80 | HR 110 | Ht 62.0 in | Wt 152.8 lb

## 2023-07-28 DIAGNOSIS — I10 Essential (primary) hypertension: Secondary | ICD-10-CM

## 2023-07-28 DIAGNOSIS — I252 Old myocardial infarction: Secondary | ICD-10-CM | POA: Diagnosis not present

## 2023-07-28 DIAGNOSIS — Z7984 Long term (current) use of oral hypoglycemic drugs: Secondary | ICD-10-CM

## 2023-07-28 DIAGNOSIS — E782 Mixed hyperlipidemia: Secondary | ICD-10-CM

## 2023-07-28 DIAGNOSIS — I25118 Atherosclerotic heart disease of native coronary artery with other forms of angina pectoris: Secondary | ICD-10-CM

## 2023-07-28 DIAGNOSIS — E1159 Type 2 diabetes mellitus with other circulatory complications: Secondary | ICD-10-CM | POA: Diagnosis not present

## 2023-07-28 NOTE — Patient Instructions (Signed)
Medication Instructions:  Your physician recommends that you continue on your current medications as directed. Please refer to the Current Medication list given to you today.  *If you need a refill on your cardiac medications before your next appointment, please call your pharmacy*   Lab Work: none If you have labs (blood work) drawn today and your tests are completely normal, you will receive your results only by: MyChart Message (if you have MyChart) OR A paper copy in the mail If you have any lab test that is abnormal or we need to change your treatment, we will call you to review the results.   Testing/Procedures: Your physician has requested that you have a lexiscan myoview. For further information please visit [email or web address] Please follow instruction sheet, as given.    Follow-Up: At [facility], you and your health needs are our priority.  As part of our continuing mission to provide you with exceptional heart care, we have created designated Provider Care Teams.  These Care Teams include your primary Cardiologist (physician) and Advanced Practice Providers (APPs -  Physician Assistants and Nurse Practitioners) who all work together to provide you with the care you need, when you need it.  We recommend signing up for the patient portal called "MyChart".  Sign up information is provided on this After Visit Summary.  MyChart is used to connect with patients for Virtual Visits (Telemedicine).  Patients are able to view lab/test results, encounter notes, upcoming appointments, etc.  Non-urgent messages can be sent to your provider as well.   To learn more about what you can do with MyChart, go to [email or web address].    Your next appointment:   6 month(s)  Provider:   Dr Clifton James, Dr Lynnette Caffey, Dr [place]   Other Instructions

## 2023-07-29 ENCOUNTER — Telehealth (HOSPITAL_COMMUNITY): Payer: Self-pay | Admitting: *Deleted

## 2023-07-29 ENCOUNTER — Encounter (HOSPITAL_COMMUNITY): Payer: Self-pay | Admitting: *Deleted

## 2023-07-29 NOTE — Telephone Encounter (Signed)
MyChart letter sent outlining instructions for upcoming stress test on 08/02/23 at 8:00.

## 2023-08-02 ENCOUNTER — Ambulatory Visit (HOSPITAL_BASED_OUTPATIENT_CLINIC_OR_DEPARTMENT_OTHER): Payer: Medicare PPO

## 2023-08-02 DIAGNOSIS — Z4682 Encounter for fitting and adjustment of non-vascular catheter: Secondary | ICD-10-CM | POA: Diagnosis not present

## 2023-08-02 DIAGNOSIS — I82431 Acute embolism and thrombosis of right popliteal vein: Secondary | ICD-10-CM | POA: Diagnosis not present

## 2023-08-02 DIAGNOSIS — I252 Old myocardial infarction: Secondary | ICD-10-CM | POA: Insufficient documentation

## 2023-08-02 DIAGNOSIS — Z6831 Body mass index (BMI) 31.0-31.9, adult: Secondary | ICD-10-CM | POA: Diagnosis not present

## 2023-08-02 DIAGNOSIS — R0602 Shortness of breath: Secondary | ICD-10-CM | POA: Diagnosis not present

## 2023-08-02 DIAGNOSIS — I82411 Acute embolism and thrombosis of right femoral vein: Secondary | ICD-10-CM | POA: Diagnosis not present

## 2023-08-02 DIAGNOSIS — Z1152 Encounter for screening for COVID-19: Secondary | ICD-10-CM | POA: Diagnosis not present

## 2023-08-02 DIAGNOSIS — K761 Chronic passive congestion of liver: Secondary | ICD-10-CM | POA: Diagnosis present

## 2023-08-02 DIAGNOSIS — J9601 Acute respiratory failure with hypoxia: Secondary | ICD-10-CM | POA: Diagnosis present

## 2023-08-02 DIAGNOSIS — Z0389 Encounter for observation for other suspected diseases and conditions ruled out: Secondary | ICD-10-CM | POA: Diagnosis not present

## 2023-08-02 DIAGNOSIS — I25118 Atherosclerotic heart disease of native coronary artery with other forms of angina pectoris: Secondary | ICD-10-CM | POA: Insufficient documentation

## 2023-08-02 DIAGNOSIS — R9082 White matter disease, unspecified: Secondary | ICD-10-CM | POA: Diagnosis not present

## 2023-08-02 DIAGNOSIS — D696 Thrombocytopenia, unspecified: Secondary | ICD-10-CM | POA: Diagnosis not present

## 2023-08-02 DIAGNOSIS — I2602 Saddle embolus of pulmonary artery with acute cor pulmonale: Secondary | ICD-10-CM | POA: Diagnosis present

## 2023-08-02 DIAGNOSIS — Z86711 Personal history of pulmonary embolism: Secondary | ICD-10-CM | POA: Diagnosis not present

## 2023-08-02 DIAGNOSIS — I700 Atherosclerosis of aorta: Secondary | ICD-10-CM | POA: Diagnosis not present

## 2023-08-02 DIAGNOSIS — I469 Cardiac arrest, cause unspecified: Secondary | ICD-10-CM | POA: Diagnosis not present

## 2023-08-02 DIAGNOSIS — E7800 Pure hypercholesterolemia, unspecified: Secondary | ICD-10-CM | POA: Diagnosis present

## 2023-08-02 DIAGNOSIS — M3320 Polymyositis, organ involvement unspecified: Secondary | ICD-10-CM | POA: Diagnosis present

## 2023-08-02 DIAGNOSIS — R569 Unspecified convulsions: Secondary | ICD-10-CM | POA: Diagnosis not present

## 2023-08-02 DIAGNOSIS — E871 Hypo-osmolality and hyponatremia: Secondary | ICD-10-CM | POA: Diagnosis not present

## 2023-08-02 DIAGNOSIS — I2699 Other pulmonary embolism without acute cor pulmonale: Secondary | ICD-10-CM | POA: Diagnosis present

## 2023-08-02 DIAGNOSIS — I462 Cardiac arrest due to underlying cardiac condition: Secondary | ICD-10-CM | POA: Diagnosis not present

## 2023-08-02 DIAGNOSIS — I6782 Cerebral ischemia: Secondary | ICD-10-CM | POA: Diagnosis not present

## 2023-08-02 DIAGNOSIS — J810 Acute pulmonary edema: Secondary | ICD-10-CM | POA: Diagnosis not present

## 2023-08-02 DIAGNOSIS — E0822 Diabetes mellitus due to underlying condition with diabetic chronic kidney disease: Secondary | ICD-10-CM | POA: Diagnosis not present

## 2023-08-02 DIAGNOSIS — I824Z1 Acute embolism and thrombosis of unspecified deep veins of right distal lower extremity: Secondary | ICD-10-CM | POA: Diagnosis not present

## 2023-08-02 DIAGNOSIS — R918 Other nonspecific abnormal finding of lung field: Secondary | ICD-10-CM | POA: Diagnosis not present

## 2023-08-02 DIAGNOSIS — Z452 Encounter for adjustment and management of vascular access device: Secondary | ICD-10-CM | POA: Diagnosis not present

## 2023-08-02 DIAGNOSIS — R4182 Altered mental status, unspecified: Secondary | ICD-10-CM | POA: Diagnosis not present

## 2023-08-02 DIAGNOSIS — E876 Hypokalemia: Secondary | ICD-10-CM | POA: Diagnosis not present

## 2023-08-02 DIAGNOSIS — I2609 Other pulmonary embolism with acute cor pulmonale: Secondary | ICD-10-CM | POA: Diagnosis not present

## 2023-08-02 DIAGNOSIS — I10 Essential (primary) hypertension: Secondary | ICD-10-CM | POA: Diagnosis present

## 2023-08-02 DIAGNOSIS — N179 Acute kidney failure, unspecified: Secondary | ICD-10-CM | POA: Diagnosis present

## 2023-08-02 DIAGNOSIS — D320 Benign neoplasm of cerebral meninges: Secondary | ICD-10-CM | POA: Diagnosis not present

## 2023-08-02 DIAGNOSIS — D329 Benign neoplasm of meninges, unspecified: Secondary | ICD-10-CM | POA: Diagnosis present

## 2023-08-02 DIAGNOSIS — I50810 Right heart failure, unspecified: Secondary | ICD-10-CM | POA: Diagnosis not present

## 2023-08-02 DIAGNOSIS — K7200 Acute and subacute hepatic failure without coma: Secondary | ICD-10-CM | POA: Diagnosis not present

## 2023-08-02 DIAGNOSIS — E872 Acidosis, unspecified: Secondary | ICD-10-CM | POA: Diagnosis present

## 2023-08-02 DIAGNOSIS — E785 Hyperlipidemia, unspecified: Secondary | ICD-10-CM | POA: Diagnosis not present

## 2023-08-02 DIAGNOSIS — E1165 Type 2 diabetes mellitus with hyperglycemia: Secondary | ICD-10-CM | POA: Diagnosis not present

## 2023-08-02 DIAGNOSIS — E11649 Type 2 diabetes mellitus with hypoglycemia without coma: Secondary | ICD-10-CM | POA: Diagnosis present

## 2023-08-02 DIAGNOSIS — D638 Anemia in other chronic diseases classified elsewhere: Secondary | ICD-10-CM | POA: Diagnosis present

## 2023-08-02 LAB — MYOCARDIAL PERFUSION IMAGING
LV dias vol: 38 mL (ref 46–106)
LV sys vol: 12 mL
Nuc Stress EF: 69 %
Peak HR: 102 {beats}/min
Rest HR: 84 {beats}/min
Rest Nuclear Isotope Dose: 9.9 mCi
SDS: 8
SRS: 0
SSS: 8
ST Depression (mm): 0 mm
Stress Nuclear Isotope Dose: 30.8 mCi
TID: 1.11

## 2023-08-02 MED ORDER — TECHNETIUM TC 99M TETROFOSMIN IV KIT
30.8000 | PACK | Freq: Once | INTRAVENOUS | Status: AC | PRN
  Administered 2023-08-02: 30.8 via INTRAVENOUS

## 2023-08-02 MED ORDER — REGADENOSON 0.4 MG/5ML IV SOLN
0.4000 mg | Freq: Once | INTRAVENOUS | Status: AC
  Administered 2023-08-02: 0.4 mg via INTRAVENOUS

## 2023-08-02 MED ORDER — TECHNETIUM TC 99M TETROFOSMIN IV KIT
9.9000 | PACK | Freq: Once | INTRAVENOUS | Status: AC | PRN
  Administered 2023-08-02: 9.9 via INTRAVENOUS

## 2023-08-02 NOTE — Progress Notes (Signed)
Normal stress test.  Normal LVEF.  Great result for her.

## 2023-08-03 ENCOUNTER — Inpatient Hospital Stay (HOSPITAL_BASED_OUTPATIENT_CLINIC_OR_DEPARTMENT_OTHER)
Admission: EM | Admit: 2023-08-03 | Discharge: 2023-08-11 | DRG: 163 | Disposition: A | Discharge status: Home Health | Payer: Medicare PPO | Attending: Internal Medicine | Admitting: Internal Medicine

## 2023-08-03 ENCOUNTER — Encounter (HOSPITAL_BASED_OUTPATIENT_CLINIC_OR_DEPARTMENT_OTHER): Payer: Self-pay

## 2023-08-03 ENCOUNTER — Other Ambulatory Visit: Payer: Self-pay

## 2023-08-03 ENCOUNTER — Telehealth: Payer: Self-pay | Admitting: *Deleted

## 2023-08-03 ENCOUNTER — Inpatient Hospital Stay (HOSPITAL_COMMUNITY): Payer: Medicare PPO

## 2023-08-03 ENCOUNTER — Emergency Department (HOSPITAL_BASED_OUTPATIENT_CLINIC_OR_DEPARTMENT_OTHER): Payer: Medicare PPO

## 2023-08-03 DIAGNOSIS — J810 Acute pulmonary edema: Secondary | ICD-10-CM | POA: Diagnosis not present

## 2023-08-03 DIAGNOSIS — Z1152 Encounter for screening for COVID-19: Secondary | ICD-10-CM | POA: Diagnosis not present

## 2023-08-03 DIAGNOSIS — I2781 Cor pulmonale (chronic): Secondary | ICD-10-CM | POA: Diagnosis present

## 2023-08-03 DIAGNOSIS — Z79624 Long term (current) use of inhibitors of nucleotide synthesis: Secondary | ICD-10-CM

## 2023-08-03 DIAGNOSIS — I2510 Atherosclerotic heart disease of native coronary artery without angina pectoris: Secondary | ICD-10-CM | POA: Diagnosis present

## 2023-08-03 DIAGNOSIS — D7589 Other specified diseases of blood and blood-forming organs: Secondary | ICD-10-CM | POA: Diagnosis not present

## 2023-08-03 DIAGNOSIS — F419 Anxiety disorder, unspecified: Secondary | ICD-10-CM | POA: Diagnosis present

## 2023-08-03 DIAGNOSIS — I82411 Acute embolism and thrombosis of right femoral vein: Secondary | ICD-10-CM | POA: Diagnosis not present

## 2023-08-03 DIAGNOSIS — E559 Vitamin D deficiency, unspecified: Secondary | ICD-10-CM | POA: Diagnosis present

## 2023-08-03 DIAGNOSIS — E11649 Type 2 diabetes mellitus with hypoglycemia without coma: Secondary | ICD-10-CM | POA: Diagnosis present

## 2023-08-03 DIAGNOSIS — Z885 Allergy status to narcotic agent status: Secondary | ICD-10-CM

## 2023-08-03 DIAGNOSIS — D696 Thrombocytopenia, unspecified: Secondary | ICD-10-CM | POA: Diagnosis not present

## 2023-08-03 DIAGNOSIS — Z955 Presence of coronary angioplasty implant and graft: Secondary | ICD-10-CM

## 2023-08-03 DIAGNOSIS — R569 Unspecified convulsions: Secondary | ICD-10-CM | POA: Diagnosis not present

## 2023-08-03 DIAGNOSIS — I2609 Other pulmonary embolism with acute cor pulmonale: Secondary | ICD-10-CM | POA: Diagnosis not present

## 2023-08-03 DIAGNOSIS — I824Z1 Acute embolism and thrombosis of unspecified deep veins of right distal lower extremity: Secondary | ICD-10-CM | POA: Diagnosis not present

## 2023-08-03 DIAGNOSIS — Z781 Physical restraint status: Secondary | ICD-10-CM

## 2023-08-03 DIAGNOSIS — M3320 Polymyositis, organ involvement unspecified: Secondary | ICD-10-CM | POA: Diagnosis present

## 2023-08-03 DIAGNOSIS — E739 Lactose intolerance, unspecified: Secondary | ICD-10-CM | POA: Diagnosis present

## 2023-08-03 DIAGNOSIS — I469 Cardiac arrest, cause unspecified: Secondary | ICD-10-CM

## 2023-08-03 DIAGNOSIS — R918 Other nonspecific abnormal finding of lung field: Secondary | ICD-10-CM | POA: Diagnosis not present

## 2023-08-03 DIAGNOSIS — I25119 Atherosclerotic heart disease of native coronary artery with unspecified angina pectoris: Secondary | ICD-10-CM | POA: Diagnosis present

## 2023-08-03 DIAGNOSIS — K219 Gastro-esophageal reflux disease without esophagitis: Secondary | ICD-10-CM | POA: Diagnosis present

## 2023-08-03 DIAGNOSIS — E0822 Diabetes mellitus due to underlying condition with diabetic chronic kidney disease: Secondary | ICD-10-CM | POA: Diagnosis not present

## 2023-08-03 DIAGNOSIS — Z0389 Encounter for observation for other suspected diseases and conditions ruled out: Secondary | ICD-10-CM | POA: Diagnosis not present

## 2023-08-03 DIAGNOSIS — R9082 White matter disease, unspecified: Secondary | ICD-10-CM | POA: Diagnosis not present

## 2023-08-03 DIAGNOSIS — E876 Hypokalemia: Secondary | ICD-10-CM | POA: Diagnosis not present

## 2023-08-03 DIAGNOSIS — R1310 Dysphagia, unspecified: Secondary | ICD-10-CM | POA: Diagnosis not present

## 2023-08-03 DIAGNOSIS — I462 Cardiac arrest due to underlying cardiac condition: Secondary | ICD-10-CM | POA: Diagnosis not present

## 2023-08-03 DIAGNOSIS — I2602 Saddle embolus of pulmonary artery with acute cor pulmonale: Principal | ICD-10-CM | POA: Diagnosis present

## 2023-08-03 DIAGNOSIS — D329 Benign neoplasm of meninges, unspecified: Secondary | ICD-10-CM | POA: Diagnosis present

## 2023-08-03 DIAGNOSIS — Z452 Encounter for adjustment and management of vascular access device: Secondary | ICD-10-CM | POA: Diagnosis not present

## 2023-08-03 DIAGNOSIS — D72829 Elevated white blood cell count, unspecified: Secondary | ICD-10-CM | POA: Diagnosis present

## 2023-08-03 DIAGNOSIS — K761 Chronic passive congestion of liver: Secondary | ICD-10-CM | POA: Diagnosis present

## 2023-08-03 DIAGNOSIS — D638 Anemia in other chronic diseases classified elsewhere: Secondary | ICD-10-CM | POA: Diagnosis present

## 2023-08-03 DIAGNOSIS — Z888 Allergy status to other drugs, medicaments and biological substances status: Secondary | ICD-10-CM

## 2023-08-03 DIAGNOSIS — Z86711 Personal history of pulmonary embolism: Secondary | ICD-10-CM | POA: Diagnosis not present

## 2023-08-03 DIAGNOSIS — J811 Chronic pulmonary edema: Secondary | ICD-10-CM | POA: Diagnosis present

## 2023-08-03 DIAGNOSIS — M797 Fibromyalgia: Secondary | ICD-10-CM | POA: Diagnosis present

## 2023-08-03 DIAGNOSIS — E871 Hypo-osmolality and hyponatremia: Secondary | ICD-10-CM | POA: Diagnosis not present

## 2023-08-03 DIAGNOSIS — D6489 Other specified anemias: Secondary | ICD-10-CM | POA: Diagnosis present

## 2023-08-03 DIAGNOSIS — E669 Obesity, unspecified: Secondary | ICD-10-CM | POA: Diagnosis present

## 2023-08-03 DIAGNOSIS — I2699 Other pulmonary embolism without acute cor pulmonale: Secondary | ICD-10-CM | POA: Diagnosis not present

## 2023-08-03 DIAGNOSIS — R570 Cardiogenic shock: Secondary | ICD-10-CM | POA: Diagnosis not present

## 2023-08-03 DIAGNOSIS — I700 Atherosclerosis of aorta: Secondary | ICD-10-CM | POA: Diagnosis not present

## 2023-08-03 DIAGNOSIS — N179 Acute kidney failure, unspecified: Secondary | ICD-10-CM | POA: Diagnosis present

## 2023-08-03 DIAGNOSIS — E785 Hyperlipidemia, unspecified: Secondary | ICD-10-CM | POA: Diagnosis not present

## 2023-08-03 DIAGNOSIS — T380X5A Adverse effect of glucocorticoids and synthetic analogues, initial encounter: Secondary | ICD-10-CM | POA: Diagnosis not present

## 2023-08-03 DIAGNOSIS — I10 Essential (primary) hypertension: Secondary | ICD-10-CM | POA: Diagnosis present

## 2023-08-03 DIAGNOSIS — I6782 Cerebral ischemia: Secondary | ICD-10-CM | POA: Diagnosis not present

## 2023-08-03 DIAGNOSIS — Z6831 Body mass index (BMI) 31.0-31.9, adult: Secondary | ICD-10-CM

## 2023-08-03 DIAGNOSIS — Z7902 Long term (current) use of antithrombotics/antiplatelets: Secondary | ICD-10-CM

## 2023-08-03 DIAGNOSIS — K7200 Acute and subacute hepatic failure without coma: Secondary | ICD-10-CM | POA: Diagnosis not present

## 2023-08-03 DIAGNOSIS — E872 Acidosis, unspecified: Secondary | ICD-10-CM | POA: Diagnosis present

## 2023-08-03 DIAGNOSIS — J9601 Acute respiratory failure with hypoxia: Secondary | ICD-10-CM

## 2023-08-03 DIAGNOSIS — G4733 Obstructive sleep apnea (adult) (pediatric): Secondary | ICD-10-CM | POA: Diagnosis present

## 2023-08-03 DIAGNOSIS — E1165 Type 2 diabetes mellitus with hyperglycemia: Secondary | ICD-10-CM | POA: Diagnosis not present

## 2023-08-03 DIAGNOSIS — R4182 Altered mental status, unspecified: Secondary | ICD-10-CM | POA: Diagnosis not present

## 2023-08-03 DIAGNOSIS — Z79899 Other long term (current) drug therapy: Secondary | ICD-10-CM

## 2023-08-03 DIAGNOSIS — I82431 Acute embolism and thrombosis of right popliteal vein: Secondary | ICD-10-CM | POA: Diagnosis not present

## 2023-08-03 DIAGNOSIS — K589 Irritable bowel syndrome without diarrhea: Secondary | ICD-10-CM | POA: Diagnosis present

## 2023-08-03 DIAGNOSIS — E7800 Pure hypercholesterolemia, unspecified: Secondary | ICD-10-CM | POA: Diagnosis present

## 2023-08-03 DIAGNOSIS — I252 Old myocardial infarction: Secondary | ICD-10-CM

## 2023-08-03 DIAGNOSIS — D320 Benign neoplasm of cerebral meninges: Secondary | ICD-10-CM | POA: Diagnosis not present

## 2023-08-03 DIAGNOSIS — R0602 Shortness of breath: Secondary | ICD-10-CM | POA: Diagnosis not present

## 2023-08-03 DIAGNOSIS — Z4682 Encounter for fitting and adjustment of non-vascular catheter: Secondary | ICD-10-CM | POA: Diagnosis not present

## 2023-08-03 DIAGNOSIS — I50810 Right heart failure, unspecified: Secondary | ICD-10-CM | POA: Diagnosis not present

## 2023-08-03 DIAGNOSIS — R34 Anuria and oliguria: Secondary | ICD-10-CM | POA: Diagnosis not present

## 2023-08-03 LAB — BASIC METABOLIC PANEL WITH GFR
Anion gap: 23 — ABNORMAL HIGH (ref 5–15)
BUN: 34 mg/dL — ABNORMAL HIGH (ref 8–23)
CO2: 12 mmol/L — ABNORMAL LOW (ref 22–32)
Calcium: 9.6 mg/dL (ref 8.9–10.3)
Chloride: 105 mmol/L (ref 98–111)
Creatinine, Ser: 1.73 mg/dL — ABNORMAL HIGH (ref 0.44–1.00)
GFR, Estimated: 32 mL/min — ABNORMAL LOW (ref >60)
Glucose, Bld: 101 mg/dL — ABNORMAL HIGH (ref 70–99)
Potassium: 5.2 mmol/L — ABNORMAL HIGH (ref 3.5–5.1)
Sodium: 140 mmol/L (ref 135–145)

## 2023-08-03 LAB — CBC WITH DIFFERENTIAL/PLATELET
Abs Immature Granulocytes: 0.07 10*3/uL (ref 0.00–0.07)
Basophils Absolute: 0 10*3/uL (ref 0.0–0.1)
Basophils Relative: 0 %
Eosinophils Absolute: 0 10*3/uL (ref 0.0–0.5)
Eosinophils Relative: 0 %
HCT: 35.9 % — ABNORMAL LOW (ref 36.0–46.0)
Hemoglobin: 11.7 g/dL — ABNORMAL LOW (ref 12.0–15.0)
Immature Granulocytes: 1 %
Lymphocytes Relative: 19 %
Lymphs Abs: 1.3 10*3/uL (ref 0.7–4.0)
MCH: 35.3 pg — ABNORMAL HIGH (ref 26.0–34.0)
MCHC: 32.6 g/dL (ref 30.0–36.0)
MCV: 108.5 fL — ABNORMAL HIGH (ref 80.0–100.0)
Monocytes Absolute: 0.6 10*3/uL (ref 0.1–1.0)
Monocytes Relative: 8 %
Neutro Abs: 4.8 10*3/uL (ref 1.7–7.7)
Neutrophils Relative %: 72 %
Platelets: 220 10*3/uL (ref 150–400)
RBC: 3.31 MIL/uL — ABNORMAL LOW (ref 3.87–5.11)
RDW: 15.7 % — ABNORMAL HIGH (ref 11.5–15.5)
WBC: 6.7 10*3/uL (ref 4.0–10.5)
nRBC: 4.5 % — ABNORMAL HIGH (ref 0.0–0.2)

## 2023-08-03 LAB — RESP PANEL BY RT-PCR (RSV, FLU A&B, COVID)  RVPGX2
Influenza A by PCR: NEGATIVE
Influenza B by PCR: NEGATIVE
Resp Syncytial Virus by PCR: NEGATIVE
SARS Coronavirus 2 by RT PCR: NEGATIVE

## 2023-08-03 LAB — LACTIC ACID, PLASMA: Lactic Acid, Venous: 7.6 mmol/L (ref 0.5–1.9)

## 2023-08-03 LAB — GLUCOSE, CAPILLARY: Glucose-Capillary: 194 mg/dL — ABNORMAL HIGH (ref 70–99)

## 2023-08-03 LAB — TROPONIN I (HIGH SENSITIVITY): Troponin I (High Sensitivity): 119 ng/L (ref <18)

## 2023-08-03 MED ORDER — DOCUSATE SODIUM 100 MG PO CAPS: 100.0000 mg | ORAL_CAPSULE | Freq: Two times a day (BID) | ORAL | Status: DC | PRN

## 2023-08-03 MED ORDER — DEXTROSE 50 % IV SOLN
INTRAVENOUS | Status: AC
  Administered 2023-08-03: 50 mL via INTRAVENOUS
  Filled 2023-08-03: qty 50

## 2023-08-03 MED ORDER — INSULIN ASPART 100 UNIT/ML IJ SOLN
0.0000 [IU] | INTRAMUSCULAR | Status: DC
  Administered 2023-08-04 (×3): 5 [IU] via SUBCUTANEOUS
  Administered 2023-08-04: 3 [IU] via SUBCUTANEOUS

## 2023-08-03 MED ORDER — CHLORHEXIDINE GLUCONATE CLOTH 2 % EX PADS
6.0000 | MEDICATED_PAD | Freq: Every day | CUTANEOUS | Status: DC
  Administered 2023-08-05 – 2023-08-09 (×7): 6 via TOPICAL

## 2023-08-03 MED ORDER — FAMOTIDINE 20 MG PO TABS
20.0000 mg | ORAL_TABLET | Freq: Every day | ORAL | Status: DC
  Administered 2023-08-04 – 2023-08-06 (×2): 20 mg
  Filled 2023-08-03 (×3): qty 1

## 2023-08-03 MED ORDER — HYDROCORTISONE SOD SUC (PF) 100 MG IJ SOLR
100.0000 mg | Freq: Two times a day (BID) | INTRAMUSCULAR | Status: DC
  Administered 2023-08-04 – 2023-08-05 (×3): 100 mg via INTRAVENOUS
  Filled 2023-08-03 (×3): qty 2

## 2023-08-03 MED ORDER — ETOMIDATE 2 MG/ML IV SOLN
INTRAVENOUS | Status: AC
  Filled 2023-08-03: qty 20

## 2023-08-03 MED ORDER — FENTANYL 2500MCG IN NS 250ML (10MCG/ML) PREMIX INFUSION
25.0000 ug/h | INTRAVENOUS | Status: DC
  Administered 2023-08-04: 25 ug/h via INTRAVENOUS
  Administered 2023-08-05: 125 ug/h via INTRAVENOUS
  Filled 2023-08-03 (×3): qty 250

## 2023-08-03 MED ORDER — LACTATED RINGERS IV BOLUS
1000.0000 mL | Freq: Once | INTRAVENOUS | Status: AC
  Administered 2023-08-03: 1000 mL via INTRAVENOUS

## 2023-08-03 MED ORDER — FENTANYL CITRATE PF 50 MCG/ML IJ SOSY
25.0000 ug | PREFILLED_SYRINGE | Freq: Once | INTRAMUSCULAR | Status: DC
  Filled 2023-08-03: qty 1

## 2023-08-03 MED ORDER — NOREPINEPHRINE 4 MG/250ML-% IV SOLN
INTRAVENOUS | Status: AC
  Filled 2023-08-03: qty 250

## 2023-08-03 MED ORDER — POLYETHYLENE GLYCOL 3350 17 G PO PACK: 17.0000 g | PACK | Freq: Every day | ORAL | Status: DC | PRN

## 2023-08-03 MED ORDER — NOREPINEPHRINE 4 MG/250ML-% IV SOLN
2.0000 ug/min | INTRAVENOUS | Status: DC
  Administered 2023-08-03: 28 ug/min via INTRAVENOUS
  Administered 2023-08-04: 20 ug/min via INTRAVENOUS
  Administered 2023-08-04: 16 ug/min via INTRAVENOUS
  Administered 2023-08-04: 20 ug/min via INTRAVENOUS
  Administered 2023-08-04 (×2): 18 ug/min via INTRAVENOUS
  Administered 2023-08-05: 14 ug/min via INTRAVENOUS
  Administered 2023-08-05: 11 ug/min via INTRAVENOUS
  Administered 2023-08-05: 9 ug/min via INTRAVENOUS
  Administered 2023-08-06: 12 ug/min via INTRAVENOUS
  Administered 2023-08-06: 8 ug/min via INTRAVENOUS
  Filled 2023-08-03 (×11): qty 250

## 2023-08-03 MED ORDER — ORAL CARE MOUTH RINSE: 15.0000 mL | OROMUCOSAL | Status: DC | PRN

## 2023-08-03 MED ORDER — HEPARIN BOLUS VIA INFUSION
4000.0000 [IU] | Freq: Once | INTRAVENOUS | Status: AC
  Administered 2023-08-03: 4000 [IU] via INTRAVENOUS

## 2023-08-03 MED ORDER — SODIUM CHLORIDE 0.9 % IV SOLN: INTRAVENOUS | Status: DC

## 2023-08-03 MED ORDER — DEXTROSE 50 % IV SOLN: 1.0000 | Freq: Once | INTRAVENOUS | Status: AC

## 2023-08-03 MED ORDER — FENTANYL CITRATE PF 50 MCG/ML IJ SOSY
PREFILLED_SYRINGE | INTRAMUSCULAR | Status: AC
  Filled 2023-08-03: qty 2

## 2023-08-03 MED ORDER — FENTANYL BOLUS VIA INFUSION: 25.0000 ug | INTRAVENOUS | Status: DC | PRN

## 2023-08-03 MED ORDER — IOHEXOL 350 MG/ML SOLN
100.0000 mL | Freq: Once | INTRAVENOUS | Status: AC | PRN
  Administered 2023-08-03: 80 mL via INTRAVENOUS

## 2023-08-03 MED ORDER — TENECTEPLASE 50 MG IV KIT
35.0000 mg | PACK | INTRAVENOUS | Status: AC
  Administered 2023-08-03: 35 mg via INTRAVENOUS
  Filled 2023-08-03: qty 10

## 2023-08-03 MED ORDER — HEPARIN (PORCINE) 25000 UT/250ML-% IV SOLN
1100.0000 [IU]/h | INTRAVENOUS | Status: DC
  Administered 2023-08-03: 1100 [IU]/h via INTRAVENOUS
  Filled 2023-08-03: qty 250

## 2023-08-03 MED ORDER — SODIUM CHLORIDE 0.9 % IV SOLN: 250.0000 mL | INTRAVENOUS | Status: DC

## 2023-08-03 MED ORDER — DOCUSATE SODIUM 50 MG/5ML PO LIQD
100.0000 mg | Freq: Two times a day (BID) | ORAL | Status: DC
  Administered 2023-08-04 – 2023-08-06 (×3): 100 mg
  Filled 2023-08-03 (×3): qty 10

## 2023-08-03 MED ORDER — POLYETHYLENE GLYCOL 3350 17 G PO PACK
17.0000 g | PACK | Freq: Every day | ORAL | Status: DC
  Administered 2023-08-04: 17 g
  Filled 2023-08-03 (×3): qty 1

## 2023-08-03 NOTE — Progress Notes (Signed)
ANTICOAGULATION CONSULT NOTE - Initial Consult  Pharmacy Consult for Heparin Indication: pulmonary embolus  Allergies  Allergen Reactions   Actos [Pioglitazone] Other (See Comments)    REACTION: pt states INTOL w/ edema   Codeine Other (See Comments)    REACTION: vomiting   Januvia [Sitagliptin] Nausea Only   Lactose Intolerance (Gi) Diarrhea   Morphine Nausea Only and Nausea And Vomiting    REACTION: vomiting REACTION: vomiting   Repatha [Evolocumab]     WAS NOT EFFECTIVE IN LOWERING LDL    Patient Measurements: Height: 5\' 2"  (157.5 cm) Weight: 68.9 kg (151 lb 14.4 oz) IBW/kg (Calculated) : 50.1 Heparin Dosing Weight: 64.5 kg  Vital Signs: Temp: 95.3 F (35.2 C) (09/18 2013) Temp Source: Temporal (09/18 2013) BP: 89/45 (09/18 2105) Pulse Rate: 78 (09/18 2105)  Labs: Recent Labs    08/03/23 1954  HGB 11.7*  HCT 35.9*  PLT 220  CREATININE 1.73*    Estimated Creatinine Clearance: 28.3 mL/min (A) (by C-G formula based on SCr of 1.73 mg/dL (H)).   Medical History: Past Medical History:  Diagnosis Date   Acute bronchitis    Allergic rhinitis, cause unspecified    Anemia    Anxiety    B12 deficiency    BV (bacterial vaginosis) 06/22/1996   Calculus of kidney    Calculus of kidney    Coronary atherosclerosis of unspecified type of vessel, native or graft    Dizziness    Essential hypertension, benign    Fibroid 2003   Fibromyalgia    H/O dysmenorrhea 2008   H/O varicella    Headache(784.0)    frequently   HSV-2 infection 2009   Hyperplastic colon polyp 05/16/2014   Irritable bowel syndrome    Meniere's disease, unspecified    Menses, irregular 2003   Myalgia and myositis, unspecified    Myocardial infarction (HCC)    Obstructive sleep apnea (adult) (pediatric)    Perimenopausal symptoms 2003   Pure hypercholesterolemia    Sarcoidosis    Type II or unspecified type diabetes mellitus without mention of complication, not stated as uncontrolled     Unspecified venous (peripheral) insufficiency    Vitamin D deficiency    Vulvitis 2010   Yeast infection     Medications:  (Not in a hospital admission)  Scheduled:  Infusions:  PRN:   Assessment: 25 yof with a history of CAD s/p PCI, GERD, Sarcoid on azathioprine, HLD, Obesity, DM. Patient is presenting with SOB. Heparin per pharmacy consult placed for suspected pulmonary embolus.  Patient is not on anticoagulation prior to arrival.  Hgb pending; plt pending  Goal of Therapy:  Heparin level 0.3-0.7 units/ml Monitor platelets by anticoagulation protocol: Yes   Plan:  Give IV heparin 4000 units bolus x 1 Start heparin infusion at 1100 units/hr Check anti-Xa level in 8 hours and daily while on heparin Continue to monitor H&H and platelets  Delmar Landau, PharmD, BCPS 08/03/2023 9:07 PM ED Clinical Pharmacist -  [phone]

## 2023-08-03 NOTE — ED Notes (Signed)
EDP made aware of increase in oxygen requirement. No further orders at this time.

## 2023-08-03 NOTE — Procedures (Signed)
Cardiopulmonary Resuscitation Note  Jocelyn [name]  [id number]  1956-08-08  Date:08/03/23  Time:11:37 PM   Provider Performing:Ayrton Mcvay V. Aahan Marques   Procedure: Cardiopulmonary Resuscitation [phone])  Indication(s) Loss of Pulse  Consent N/A  Anesthesia N/A   Time Out N/A   Sterile Technique Hand hygiene, gloves   Procedure Description Called to patient's room for CODE BLUE. Initial rhythm was PEA/Asystole. Patient received high quality chest compressions for 4 minutes . Epinephrine was administered x 1 every 3 minutes as directed by time Biomedical engineer. Additional pharmacologic interventions included D50. Additional procedural interventions include intubation.  Return of spontaneous circulation was achieved.  Family at bedside.   Complications/Tolerance N/A   EBL N/A   Specimen(s) N/A  Estimated time to ROSC: 4-6 minutes  Jocelyn [name] V. Vassie Loll MD

## 2023-08-03 NOTE — ED Notes (Signed)
BP trending downward.  Dr Doran Durand notified Ordered recieved

## 2023-08-03 NOTE — H&P (Addendum)
NAME:  Jocelyn [name], MRN:  [id number], DOB:  December 29, 1955, LOS: 0 ADMISSION DATE:  08/03/2023, CONSULTATION DATE:  08/03/2023  REFERRING MD:  Doran Durand EDP, CHIEF COMPLAINT: Shortness of breath  History of Present Illness:  67 year old woman presented to ED with shortness of breath for 1 week. She was evaluated by cardiology on 9/12 for vague symptoms of fatigue, myocardial perfusion study was performed 9/17 which was normal. She was hypertensive on arrival to the ED, hypothermic 95 7, toxic requiring 6 L nasal cannula Initial labs showed lactate of 7.6.  Code sepsis was initiated, COVID testing was negative CT angiogram chest was obtained which showed saddle pulm embolism as well as extensive bilateral emboli with RV/LV ratio 1.7 Soon after arrival to ICU, she developed PEA arrest with ROSC after 5 minutes of CPR, epi x 1, TNK given, intubated post arrest  Pertinent  Medical History  Inferior wall STEMI 06/2017, status post DES to RCA Right subclavian lusoria variant Hyperlipidemia Polymyositis on azathioprine and steroids Sarcoidosis, inactive by imaging  Significant Hospital Events: Including procedures, antibiotic start and stop dates in addition to other pertinent events   9/18 PEA arrest after arrival to ICU  Interim History / Subjective:  On peripheral Levophed Critically ill, intubated  Objective   Blood pressure 101/73, pulse 78, temperature (!) 95.3 F (35.2 C), temperature source Temporal, resp. rate (!) 41, height 5\' 2"  (1.575 m), weight 68.9 kg, SpO2 100%.        Intake/Output Summary (Last 24 hours) at 08/03/2023 2235 Last data filed at 08/03/2023 2149 Gross per 24 hour  Intake 1045.18 ml  Output --  Net 1045.18 ml   Filed Weights   08/03/23 1937  Weight: 68.9 kg    Examination: Gen. Pleasant, obese, in moderate distress, anxious affect ENT - no pallor,icterus, no post nasal drip, class 2 airway Neck: No JVD, no thyromegaly, no carotid bruits Lungs: no  use of accessory muscles, no dullness to percussion, decreased without rales or rhonchi  Cardiovascular: Rhythm regular, heart sounds  normal, no murmurs or gallops, no peripheral edema Abdomen: soft and non-tender, no hepatosplenomegaly, BS normal. Musculoskeletal: No deformities, no cyanosis or clubbing Neuro:  alert on arrival, non focal, no tremors   Labs significant for no leukocytosis, mild hyperkalemia, BUN/creatinine 34/1.7  Resolved Hospital Problem list     Assessment & Plan:  Saddle PE Obstructive shock PEA cardiac arrest  -TNK given during arrest, ROSC obtained -Bedside echo shows enlarged RV , will contact IR, may yet need salvage procedure/aspiration thrombectomy -Peripheral Levophed to maintain MAP -IV heparin per pharmacy -Check venous duplex for completion   Acute respiratory failure with hypoxia -Intubated postcardiac arrest -Vent settings reviewed and adjusted PAD protocol with fentanyl drip for goal RASS 0 to -1   CAD status post DES to RCA -Hold Plavix -Hold losartan and Cardizem  AKI -related to shock, may worsen postcontrast -Follow renal function, urine output -Hold ARB  Polymyositis -resume azathioprine in a.m. -Stress dose steroids  Diabetes type 2 -SSI Hypoglycemic on arrival  Best Practice (right click and "Reselect all SmartList Selections" daily)   Diet/type: Regular consistency (see orders) DVT prophylaxis: systemic heparin GI prophylaxis: N/A Lines: N/A Foley:  N/A Code Status:  full code Last date of multidisciplinary goals of care discussion [NA] daughter updated at bedside, desires further cardiac resuscitation if needed  Labs   CBC: Recent Labs  Lab 08/03/23 1954  WBC PENDING  NEUTROABS PENDING  HGB 11.7*  HCT 35.9*  MCV 108.5*  PLT 220    Basic Metabolic Panel: Recent Labs  Lab 08/03/23 1954  NA 140  K 5.2*  CL 105  CO2 12*  GLUCOSE 101*  BUN 34*  CREATININE 1.73*  CALCIUM 9.6   GFR: Estimated  Creatinine Clearance: 28.3 mL/min (A) (by C-G formula based on SCr of 1.73 mg/dL (H)). Recent Labs  Lab 08/03/23 1954  WBC PENDING  LATICACIDVEN 7.6*    Liver Function Tests: No results for input(s): "AST", "ALT", "ALKPHOS", "BILITOT", "PROT", "ALBUMIN" in the last 168 hours. No results for input(s): "LIPASE", "AMYLASE" in the last 168 hours. No results for input(s): "AMMONIA" in the last 168 hours.  ABG    Component Value Date/Time   TCO2 22 06/15/2017 1251     Coagulation Profile: No results for input(s): "INR", "PROTIME" in the last 168 hours.  Cardiac Enzymes: No results for input(s): "CKTOTAL", "CKMB", "CKMBINDEX", "TROPONINI" in the last 168 hours.  HbA1C: Hgb A1c MFr Bld  Date/Time Value Ref Range Status  08/17/2018 04:20 PM 6.5 (H) 4.8 - 5.6 % Final    Comment:    (NOTE)         Prediabetes: 5.7 - 6.4         Diabetes: >6.4         Glycemic control for adults with diabetes: <7.0   09/08/2011 03:53 PM 6.7 (H) 4.6 - 6.5 % Final    Comment:    Glycemic Control Guidelines for People with Diabetes:Non Diabetic:  <6%Goal of Therapy: <7%Additional Action Suggested:  >8%     CBG: No results for input(s): "GLUCAP" in the last 168 hours.  Review of Systems:   Shortness of breath for 1 week No chest pain Complains of back pain due to "lying on it"  Past Medical History:  She,  has a past medical history of Acute bronchitis, Allergic rhinitis, cause unspecified, Anemia, Anxiety, B12 deficiency, BV (bacterial vaginosis) (06/22/1996), Calculus of kidney, Calculus of kidney, Coronary atherosclerosis of unspecified type of vessel, native or graft, Dizziness, Essential hypertension, benign, Fibroid (2003), Fibromyalgia, H/O dysmenorrhea (2008), H/O varicella, Headache(784.0), HSV-2 infection (2009), Hyperplastic colon polyp (05/16/2014), Irritable bowel syndrome, Meniere's disease, unspecified, Menses, irregular (2003), Myalgia and myositis, unspecified, Myocardial infarction  (HCC), Obstructive sleep apnea (adult) (pediatric), Perimenopausal symptoms (2003), Pure hypercholesterolemia, Sarcoidosis, Type II or unspecified type diabetes mellitus without mention of complication, not stated as uncontrolled, Unspecified venous (peripheral) insufficiency, Vitamin D deficiency, Vulvitis (2010), and Yeast infection.   Surgical History:   Past Surgical History:  Procedure Laterality Date   COLONOSCOPY     CORONARY STENT INTERVENTION N/A 06/15/2017   Procedure: CORONARY STENT INTERVENTION;  Surgeon: Corky Crafts, MD;  Location: [facility] INVASIVE CV LAB;  Service: Cardiovascular;  Laterality: N/A;   heart catherization     LEFT HEART CATH AND CORONARY ANGIOGRAPHY N/A 06/15/2017   Procedure: LEFT HEART CATH AND CORONARY ANGIOGRAPHY;  Surgeon: Corky Crafts, MD;  Location: [facility] INVASIVE CV LAB;  Service: Cardiovascular;  Laterality: N/A;   TONSILLECTOMY       Social History:   reports that she has never smoked. She has never used smokeless tobacco. She reports that she does not drink alcohol and does not use drugs.   Family History:  Her family history includes Other in an other family member. There is no history of Colon cancer, Esophageal cancer, Rectal cancer, or Stomach cancer. She was adopted.   Allergies Allergies  Allergen Reactions   Actos [Pioglitazone] Other (See Comments)    REACTION:  pt states INTOL w/ edema   Codeine Other (See Comments)    REACTION: vomiting   Januvia [Sitagliptin] Nausea Only   Lactose Intolerance (Gi) Diarrhea   Morphine Nausea Only and Nausea And Vomiting    REACTION: vomiting REACTION: vomiting   Repatha [Evolocumab]     WAS NOT EFFECTIVE IN LOWERING LDL     Home Medications  Prior to Admission medications   Medication Sig Start Date End Date Taking? Authorizing Provider  allopurinol (ZYLOPRIM) 100 MG tablet Take 1 tablet by mouth daily as needed. 09/25/21   [provider]  ALPRAZolam Prudy Feeler) 0.5 MG tablet Take  0.5 mg by mouth 3 (three) times daily. 07/15/23   [provider]  azaTHIOprine (IMURAN) 50 MG tablet Take 1 tablet by mouth daily.    [provider]  clopidogrel (PLAVIX) 75 MG tablet TAKE 1 TABLET BY MOUTH EVERY DAY 02/01/23   Corky Crafts, MD  diltiazem [facility] CD) 240 MG 24 hr capsule TAKE 1 CAPSULE(240 MG) BY MOUTH DAILY 04/04/23   Corky Crafts, MD  Empagliflozin-metFORMIN HCl ER 25-1000 MG TB24 Take 1 tablet by mouth daily.    [provider]  GEMTESA 75 MG TABS Take 1 tablet by mouth daily. 12/31/21   [provider]  icosapent Ethyl (VASCEPA) 1 g capsule TAKE 2 CAPSULES(2 GRAMS) BY MOUTH TWICE DAILY 02/03/23   Corky Crafts, MD  losartan (COZAAR) 100 MG tablet TAKE 1 TABLET(100 MG) BY MOUTH DAILY 03/03/23   Corky Crafts, MD  methylPREDNISolone (MEDROL) 4 MG tablet Take 4 mg by mouth daily. 07/08/23   [provider]  metoprolol tartrate (LOPRESSOR) 50 MG tablet TAKE 1 TABLET(50 MG) BY MOUTH TWICE DAILY 04/04/23   Corky Crafts, MD  NEXLETOL 180 MG TABS TAKE 1 TABLET BY MOUTH DAILY 07/20/23   Corky Crafts, MD  nitroGLYCERIN (NITROSTAT) 0.4 MG SL tablet Place 1 tablet (0.4 mg total) under the tongue every 5 (five) minutes as needed for chest pain. 02/08/22   Corky Crafts, MD  [facility] VERIO test strip CHECK BLOOD SUGAR TID AS DIRECTED 10/04/19   [provider]  PRALUENT 150 MG/ML SOAJ INJECT 150 MG INTO THE SKIN EVERY 14 DAYS 08/06/22   Corky Crafts, MD  spironolactone (ALDACTONE) 25 MG tablet TAKE 1 TABLET BY MOUTH EVERY DAY 02/01/23   Corky Crafts, MD     Critical care time: 38 m independent of procedures       Cyril Mourning MD. FCCP. [facility] Pager : [phone]  If no response to pager , please call [phone] until 7 pm After 7:00 pm call Elink  [phone]   08/03/2023

## 2023-08-03 NOTE — ED Provider Notes (Signed)
[place] EMERGENCY DEPARTMENT AT [facility] Provider Note   CSN: [id number] Arrival date & time: 08/03/23  1925     History Chief Complaint  Patient presents with   Shortness of Breath         HPI Jocelyn [name] is a 67 y.o. female presenting for chief complaint of SOB -Presyncope and SOB today -Stress test yesterday that she passed. -EF OK yesterday on stress -mHX of CAD s/p PCI, GERD, Sarcoid on azathioprine, HLD, Obesity, DM   Patient's recorded medical, surgical, social, medication list and allergies were reviewed in the Snapshot window as part of the initial history.   Review of Systems   Review of Systems  Constitutional:  Negative for chills and fever.  HENT:  Negative for ear pain and sore throat.   Eyes:  Negative for pain and visual disturbance.  Respiratory:  Positive for shortness of breath. Negative for cough.   Cardiovascular:  Positive for chest pain. Negative for palpitations.  Gastrointestinal:  Negative for abdominal pain and vomiting.  Genitourinary:  Negative for dysuria and hematuria.  Musculoskeletal:  Negative for arthralgias and back pain.  Skin:  Negative for color change and rash.  Neurological:  Negative for seizures and syncope.  All other systems reviewed and are negative.   Physical Exam Updated Vital Signs BP 101/73   Pulse 78   Temp (!) 95.3 F (35.2 C) (Temporal)   Resp (!) 41   Ht 5\' 2"  (1.575 m)   Wt 68.9 kg   SpO2 100%   BMI 27.78 kg/m  Physical Exam Vitals and nursing note reviewed.  Constitutional:      General: She is not in acute distress.    Appearance: She is well-developed. She is ill-appearing.  HENT:     Head: Normocephalic and atraumatic.  Eyes:     Conjunctiva/sclera: Conjunctivae normal.  Cardiovascular:     Rate and Rhythm: Regular rhythm. Tachycardia present.     Heart sounds: No murmur heard. Pulmonary:     Effort: Tachypnea and respiratory distress present.     Breath sounds: Normal  breath sounds.  Abdominal:     General: There is no distension.     Palpations: Abdomen is soft.     Tenderness: There is no abdominal tenderness. There is no right CVA tenderness or left CVA tenderness.  Musculoskeletal:        General: No swelling or tenderness. Normal range of motion.     Cervical back: Neck supple.  Skin:    General: Skin is warm and dry.  Neurological:     General: No focal deficit present.     Mental Status: She is alert and oriented to person, place, and time. Mental status is at baseline.     Cranial Nerves: No cranial nerve deficit.      ED Course/ Medical Decision Making/ A&P Clinical Course as of 08/03/23 2328  Wed Aug 03, 2023  2045 Performed echocardiogram.  Was called to bedside because of hypotension.  She has escalated to heated high flow nasal cannula at 6 L Echocardiogram demonstrating D sign.  CTA is ordered pending BMP due to requirement for renal function for CTA evening critically ill patients per this hospital's protocol. Will treat empirically with heparin in the interim. [CC]    Clinical Course User Index [CC] Glyn Ade, MD    Procedures .Critical Care  Performed by: Glyn Ade, MD Authorized by: Glyn Ade, MD   Critical care provider statement:    Critical  care time (minutes):  95   Critical care was time spent personally by me on the following activities:  Development of treatment plan with patient or surrogate, discussions with consultants, evaluation of patient's response to treatment, examination of patient, ordering and review of laboratory studies, ordering and review of radiographic studies, ordering and performing treatments and interventions, pulse oximetry, re-evaluation of patient's condition and review of old charts   Care discussed with: admitting provider   Ultrasound ED Echo  Date/Time: 08/03/2023 11:25 PM  Performed by: Glyn Ade, MD Authorized by: Glyn Ade, MD   Procedure  details:    Indications: hypotension and dyspnea     Views: subxiphoid, parasternal long axis view, parasternal short axis view, apical 4 chamber view and IVC view     Images: archived     Limitations:  Body habitus Findings:    Pericardium: no pericardial effusion     Cardiac Activity: normal cardiac activity and hyperdynamic     LV Function: depressed (30 - 50%)     RV Diameter: dilated     Other signs of RV strain: paradoxical septal motion and RV hypokinesis     IVC: normal   Impression:    Impression: abnormal cardiac activity      Medications Ordered in ED Medications  heparin ADULT infusion 100 units/mL (25000 units/240mL) (1,100 Units/hr Intravenous New Bag/Given 08/03/23 2122)  Oral care mouth rinse (has no administration in time range)  Chlorhexidine Gluconate Cloth 2 % PADS 6 each (has no administration in time range)  0.9 %  sodium chloride infusion (has no administration in time range)  tenecteplase (TNKASE) injection for PE/MI 35 mg (has no administration in time range)  fentaNYL (SUBLIMAZE) 50 MCG/ML injection (has no administration in time range)  etomidate (AMIDATE) 2 MG/ML injection (has no administration in time range)  norepinephrine (LEVOPHED) 4-5 MG/250ML-% infusion SOLN (has no administration in time range)  docusate (COLACE) 50 MG/5ML liquid 100 mg (has no administration in time range)  polyethylene glycol (MIRALAX / GLYCOLAX) packet 17 g (has no administration in time range)  fentaNYL (SUBLIMAZE) injection 25 mcg (has no administration in time range)  fentaNYL in NS (73mcg/ml) infusion-PREMIX (has no administration in time range)  fentaNYL (SUBLIMAZE) bolus via infusion 25-100 mcg (has no administration in time range)  lactated ringers bolus 1,000 mL (0 mLs Intravenous Stopped 08/03/23 2149)  iohexol (OMNIPAQUE) 350 MG/ML injection 100 mL (80 mLs Intravenous Contrast Given 08/03/23 2055)  heparin bolus via infusion 4,000 Units (4,000 Units  Intravenous Bolus from Bag 08/03/23 2122)  dextrose 50 % solution 50 mL (50 mLs Intravenous Given 08/03/23 2305)    Medical Decision Making:   This is a critically ill 67 year old female presenting with acute respiratory distress.  Her baseline is very functional that she has been being worked up for chest pain including a stress test yesterday. Per her daughter she had sudden worsening shortness of breath  On my exam, she is tachypneic very short of breath with even short phrases. Blood pressure initially low and I was called emergently to bedside.  Point-of-care echocardiogram performed as above demonstrating a positive right ventricular enlargement (D sign).  Immediately escalated patient as a code medical for immediate CT angiography.  Critical care was consulted while patient was in scanner.  We reviewed CT together with agreement for substantial pulmonary embolism. We discussed administration of tenecteplase at the stand-alone emergency department.  Patient does not have absolute contraindication.  However she has stabilized with normalization of  blood pressure with 1 L IV fluid and has stabilized with 100% oxygen saturation on 6 L nasal cannula.  Ultimately shared medical decision making with patient and family.  She is very hesitant to try tenecteplase especially in a stand-alone setting with minimal resuscitation options.  Daughter is at bedside and is going to consider it while patient is transferred immediately over to ICU at [facility]. Notably patient was activated as a code sepsis due to the hypotension and hypothermia, however on identification of large PE and lack of any focal infectious pathology, sepsis is not the diagnosis.  Patient reassessed frequently by myself at bedside family was updated on plan of care and ultimately patient required critical care transport to ICU.  Disposition:   Based on the above findings, I believe this patient is stable for admission.     Patient/family educated about specific findings on our evaluation and explained exact reasons for admission.  Patient/family educated about clinical situation and time was allowed to answer questions.   Admission team communicated with and agreed with need for admission. Patient admitted. Patient ready to move at this time.     Emergency Department Medication Summary:   Medications  heparin ADULT infusion 100 units/mL (25000 units/213mL) (1,100 Units/hr Intravenous New Bag/Given 08/03/23 2122)  Oral care mouth rinse (has no administration in time range)  Chlorhexidine Gluconate Cloth 2 % PADS 6 each (has no administration in time range)  0.9 %  sodium chloride infusion (has no administration in time range)  tenecteplase (TNKASE) injection for PE/MI 35 mg (has no administration in time range)  fentaNYL (SUBLIMAZE) 50 MCG/ML injection (has no administration in time range)  etomidate (AMIDATE) 2 MG/ML injection (has no administration in time range)  norepinephrine (LEVOPHED) 4-5 MG/250ML-% infusion SOLN (has no administration in time range)  docusate (COLACE) 50 MG/5ML liquid 100 mg (has no administration in time range)  polyethylene glycol (MIRALAX / GLYCOLAX) packet 17 g (has no administration in time range)  fentaNYL (SUBLIMAZE) injection 25 mcg (has no administration in time range)  fentaNYL in NS (37mcg/ml) infusion-PREMIX (has no administration in time range)  fentaNYL (SUBLIMAZE) bolus via infusion 25-100 mcg (has no administration in time range)  lactated ringers bolus 1,000 mL (0 mLs Intravenous Stopped 08/03/23 2149)  iohexol (OMNIPAQUE) 350 MG/ML injection 100 mL (80 mLs Intravenous Contrast Given 08/03/23 2055)  heparin bolus via infusion 4,000 Units (4,000 Units Intravenous Bolus from Bag 08/03/23 2122)  dextrose 50 % solution 50 mL (50 mLs Intravenous Given 08/03/23 2305)    Clinical Impression:  1. Acute pulmonary embolism, unspecified pulmonary embolism type,  unspecified whether acute cor pulmonale present (HCC)      Admit   Final Clinical Impression(s) / ED Diagnoses Final diagnoses:  Acute pulmonary embolism, unspecified pulmonary embolism type, unspecified whether acute cor pulmonale present [facility])    Rx / DC Orders ED Discharge Orders     None         Glyn Ade, MD 08/03/23 2328

## 2023-08-03 NOTE — ED Notes (Signed)
ED Provider at bedside. 

## 2023-08-03 NOTE — Telephone Encounter (Signed)
-----   Message from [place] sent at 08/02/2023  4:14 PM EDT ----- Normal stress test.  Normal LVEF.  Great result for her.

## 2023-08-03 NOTE — ED Notes (Signed)
Blood cultures x2, dark green, SST, and lactic collected and sent to lab at this time.

## 2023-08-03 NOTE — Procedures (Signed)
Intubation Procedure Note  YARISBEL BOATENG  [id number]  [birth year]  Date:08/03/23  Time:11:34 PM   Provider Performing:Christy Friede V. Tatianna Ibbotson    Procedure: Intubation (31500)  Indication(s) Respiratory Failure  Consent Unable to obtain consent due to emergent nature of procedure. Performed post arrest   Anesthesia Etomidate, Fentanyl, and Rocuronium   Time Out Verified patient identification, verified procedure, site/side was marked, verified correct patient position, special equipment/implants available, medications/allergies/relevant history reviewed, required imaging and test results available.   Sterile Technique Usual hand hygeine, masks, and gloves were used   Procedure Description Patient positioned in bed supine.  Sedation given as noted above.  Patient was intubated with endotracheal tube using Glidescope.  View was Grade 1 full glottis .  Number of attempts was 1.  Colorimetric CO2 detector was consistent with tracheal placement.   Complications/Tolerance None; patient tolerated the procedure well. Chest X-ray is ordered to verify placement.   EBL Minimal   Specimen(s) None   Destyn Parfitt V. Vassie Loll MD

## 2023-08-03 NOTE — Progress Notes (Addendum)
eLink Physician-Brief Progress Note Patient Name: Jocelyn [name] DOB: Nov 08, 1956 MRN: [id number]   Date of Service  08/03/2023  HPI/Events of Note  35F with CAD s/p stent, HLD, DM2, HTN, hx OSA with SOB x 1 week found hypoxemic. Started on 6L O2. Initially hypertensive on arrival with worsening SBP to 80s/90s.  CTA with saddle PE and RV/LV 1.74. Transferred to [facility].  On arrival she went to cardiac arrest x 2 min. Given TNK 35mg .  Intubated after ROSC achieved. Arrested again for 2 min. On levophed.   CXR reviewed ETT in appropriate position  ABG 6.8 Hg 11.7>9.4   eICU Interventions  #Post PEA arrest 2/2 saddle PE #Obstructive shock -S/p TNK. Trend CBC -Heparin gtt -IR consulted for salvage thrombectomy. Discussed with attending at bedside. He will discuss options with family -Echo, trop -Wean levophed for MAP >65 -LE dopplers -Stress dose steroids -Trend LA  #AHRF 2/2 PE -Full vent support  #Metabolic acidosis #AKI -Reviewed ABG. Metabolic acidosis. Give 2 amps bicarb and start gtt -IVF bolus -Increased RR to 30    1:44 AM Restraints renewed. Vented patient  :616 AM Bladder >400. Insert foley. ABG improved to 7.105/28/157 on RR 30. Continue sodium bicarb gtt  Intervention Category Evaluation Type: New Patient Evaluation  Demian Maisel Mechele Collin 08/03/2023, 11:14 PM

## 2023-08-03 NOTE — ED Notes (Signed)
Report given to [facility] via phone, attempted to give report to ICU nurse, sts they will call back

## 2023-08-03 NOTE — Progress Notes (Addendum)
Elink monitoring for the code sepsis protocol.  Patient has PE. Sepsis cancelled by provider.

## 2023-08-03 NOTE — Progress Notes (Incomplete)
eLink Physician-Brief Progress Note Patient Name: Jocelyn [name] DOB: 06/19/1956 MRN: [id number]   Date of Service  08/03/2023  HPI/Events of Note  3F with CAD s/p stent, HLD, DM2, HTN, hx OSA with SOB x 1 week found hypoxemic. Started on 6L O2. Initially hypertensive on arrival with worsening SBP to 80s/90s.  CTA with saddle PE and RV/LV 1.74. Transferred to [facility].  On arrival she went to cardiac arrest. Given TPA. Intubated after ROSC achieved. Arrested again.   eICU Interventions       Intervention Category Evaluation Type: New Patient Evaluation  Wright Gravely Mechele Collin 08/03/2023, 11:14 PM

## 2023-08-03 NOTE — ED Notes (Signed)
To CT scan on monitor with nurse and respiratory

## 2023-08-03 NOTE — ED Notes (Signed)
[facility] here for transport.

## 2023-08-03 NOTE — ED Notes (Signed)
Jocelyn [name] with cl called for transport

## 2023-08-03 NOTE — Telephone Encounter (Signed)
I spoke with patient's daughter and reviewed stress test results with her.  She reports patient has been nauseated since stress test yesterday.  No vomiting. Poor appetite.  I advised her to eat a bland diet for now and follow up with PCP if nausea continues.  Daughter reports patient has been more short of breath since seeing Dr Eldridge Dace last week.  Occurs at rest and with exertion.  Is worse with exertion.  No swelling. No weight gain.  No chest pain. No cough.  She is wheezing and has had fever off and on.  I advised her to follow up with PCP for these symptoms.  Patient advised to go to ED if symptoms worsen

## 2023-08-03 NOTE — ED Triage Notes (Signed)
POV from home, A&O x 4, GCS 15, BIB wheelchair  Pt sts that since last week she has been more SOB at rest and with exertion, denies cough, normal routine stress test yesterday, MI 5 years ago. Sts palpable fevers on and off.

## 2023-08-04 ENCOUNTER — Encounter (HOSPITAL_COMMUNITY)
Admission: EM | Disposition: A | Discharge status: Home Health | Payer: Self-pay | Source: Home / Self Care | Attending: Internal Medicine

## 2023-08-04 ENCOUNTER — Inpatient Hospital Stay (HOSPITAL_COMMUNITY): Payer: Medicare PPO

## 2023-08-04 ENCOUNTER — Encounter (HOSPITAL_COMMUNITY): Payer: Self-pay | Admitting: Pulmonary Disease

## 2023-08-04 ENCOUNTER — Encounter (HOSPITAL_COMMUNITY): Payer: Self-pay | Admitting: Certified Registered Nurse Anesthetist

## 2023-08-04 DIAGNOSIS — I2609 Other pulmonary embolism with acute cor pulmonale: Secondary | ICD-10-CM

## 2023-08-04 DIAGNOSIS — I2602 Saddle embolus of pulmonary artery with acute cor pulmonale: Secondary | ICD-10-CM | POA: Diagnosis not present

## 2023-08-04 DIAGNOSIS — J810 Acute pulmonary edema: Secondary | ICD-10-CM | POA: Diagnosis not present

## 2023-08-04 DIAGNOSIS — R569 Unspecified convulsions: Secondary | ICD-10-CM

## 2023-08-04 DIAGNOSIS — R4182 Altered mental status, unspecified: Secondary | ICD-10-CM | POA: Diagnosis not present

## 2023-08-04 HISTORY — PX: IR ANGIOGRAM PULMONARY BILATERAL SELECTIVE: IMG664

## 2023-08-04 HISTORY — PX: RADIOLOGY WITH ANESTHESIA: SHX6223

## 2023-08-04 HISTORY — PX: IR THROMBECT PRIM MECH INIT (INCLU) MOD SED: IMG2297

## 2023-08-04 HISTORY — PX: IR US GUIDE VASC ACCESS RIGHT: IMG2390

## 2023-08-04 HISTORY — PX: IR ANGIOGRAM SELECTIVE EACH ADDITIONAL VESSEL: IMG667

## 2023-08-04 LAB — POCT I-STAT 7, (LYTES, BLD GAS, ICA,H+H)
Acid-base deficit: 1 mmol/L (ref 0.0–2.0)
Acid-base deficit: 19 mmol/L — ABNORMAL HIGH (ref 0.0–2.0)
Acid-base deficit: 19 mmol/L — ABNORMAL HIGH (ref 0.0–2.0)
Acid-base deficit: 26 mmol/L — ABNORMAL HIGH (ref 0.0–2.0)
Bicarbonate: 21.2 mmol/L (ref 20.0–28.0)
Bicarbonate: 6.5 mmol/L — ABNORMAL LOW (ref 20.0–28.0)
Bicarbonate: 8.1 mmol/L — ABNORMAL LOW (ref 20.0–28.0)
Bicarbonate: 8.9 mmol/L — ABNORMAL LOW (ref 20.0–28.0)
Calcium, Ion: 0.95 mmol/L — ABNORMAL LOW (ref 1.15–1.40)
Calcium, Ion: 1.03 mmol/L — ABNORMAL LOW (ref 1.15–1.40)
Calcium, Ion: 1.06 mmol/L — ABNORMAL LOW (ref 1.15–1.40)
Calcium, Ion: 1.14 mmol/L — ABNORMAL LOW (ref 1.15–1.40)
HCT: 25 % — ABNORMAL LOW (ref 36.0–46.0)
HCT: 27 % — ABNORMAL LOW (ref 36.0–46.0)
HCT: 28 % — ABNORMAL LOW (ref 36.0–46.0)
HCT: 29 % — ABNORMAL LOW (ref 36.0–46.0)
Hemoglobin: 8.5 g/dL — ABNORMAL LOW (ref 12.0–15.0)
Hemoglobin: 9.2 g/dL — ABNORMAL LOW (ref 12.0–15.0)
Hemoglobin: 9.5 g/dL — ABNORMAL LOW (ref 12.0–15.0)
Hemoglobin: 9.9 g/dL — ABNORMAL LOW (ref 12.0–15.0)
O2 Saturation: 100 %
O2 Saturation: 96 %
O2 Saturation: 99 %
O2 Saturation: 99 %
Patient temperature: 34.4
Patient temperature: 98.6
Patient temperature: 98.6
Patient temperature: 99.9
Potassium: 4.1 mmol/L (ref 3.5–5.1)
Potassium: 4.4 mmol/L (ref 3.5–5.1)
Potassium: 4.4 mmol/L (ref 3.5–5.1)
Potassium: 4.8 mmol/L (ref 3.5–5.1)
Sodium: 136 mmol/L (ref 135–145)
Sodium: 137 mmol/L (ref 135–145)
Sodium: 137 mmol/L (ref 135–145)
Sodium: 139 mmol/L (ref 135–145)
TCO2: 10 mmol/L — ABNORMAL LOW (ref 22–32)
TCO2: 22 mmol/L (ref 22–32)
TCO2: 8 mmol/L — ABNORMAL LOW (ref 22–32)
TCO2: 9 mmol/L — ABNORMAL LOW (ref 22–32)
pCO2 arterial: 20.6 mmHg — ABNORMAL LOW (ref 32–48)
pCO2 arterial: 27.7 mmHg — ABNORMAL LOW (ref 32–48)
pCO2 arterial: 28.2 mmHg — ABNORMAL LOW (ref 32–48)
pCO2 arterial: 38.6 mmHg (ref 32–48)
pH, Arterial: 6.837 — CL (ref 7.35–7.45)
pH, Arterial: 7.105 — CL (ref 7.35–7.45)
pH, Arterial: 7.184 — CL (ref 7.35–7.45)
pH, Arterial: 7.494 — ABNORMAL HIGH (ref 7.35–7.45)
pO2, Arterial: 133 mmHg — ABNORMAL HIGH (ref 83–108)
pO2, Arterial: 139 mmHg — ABNORMAL HIGH (ref 83–108)
pO2, Arterial: 157 mmHg — ABNORMAL HIGH (ref 83–108)
pO2, Arterial: 198 mmHg — ABNORMAL HIGH (ref 83–108)

## 2023-08-04 LAB — GLUCOSE, CAPILLARY
Glucose-Capillary: 53 mg/dL — ABNORMAL LOW (ref 70–99)
Glucose-Capillary: 188 mg/dL — ABNORMAL HIGH (ref 70–99)
Glucose-Capillary: 225 mg/dL — ABNORMAL HIGH (ref 70–99)
Glucose-Capillary: 241 mg/dL — ABNORMAL HIGH (ref 70–99)
Glucose-Capillary: 216 mg/dL — ABNORMAL HIGH (ref 70–99)
Glucose-Capillary: 184 mg/dL — ABNORMAL HIGH (ref 70–99)
Glucose-Capillary: 183 mg/dL — ABNORMAL HIGH (ref 70–99)
Glucose-Capillary: 188 mg/dL — ABNORMAL HIGH (ref 70–99)

## 2023-08-04 LAB — CBC
HCT: 31 % — ABNORMAL LOW (ref 36.0–46.0)
HCT: 32.9 % — ABNORMAL LOW (ref 36.0–46.0)
Hemoglobin: 9.4 g/dL — ABNORMAL LOW (ref 12.0–15.0)
Hemoglobin: 10.9 g/dL — ABNORMAL LOW (ref 12.0–15.0)
MCH: 34.9 pg — ABNORMAL HIGH (ref 26.0–34.0)
MCH: 37.1 pg — ABNORMAL HIGH (ref 26.0–34.0)
MCHC: 30.3 g/dL (ref 30.0–36.0)
MCHC: 33.1 g/dL (ref 30.0–36.0)
MCV: 115.2 fL — ABNORMAL HIGH (ref 80.0–100.0)
MCV: 111.9 fL — ABNORMAL HIGH (ref 80.0–100.0)
Platelets: 146 K/uL — ABNORMAL LOW (ref 150–400)
Platelets: 151 K/uL (ref 150–400)
RBC: 2.69 MIL/uL — ABNORMAL LOW (ref 3.87–5.11)
RBC: 2.94 MIL/uL — ABNORMAL LOW (ref 3.87–5.11)
RDW: 15.6 % — ABNORMAL HIGH (ref 11.5–15.5)
RDW: 15.9 % — ABNORMAL HIGH (ref 11.5–15.5)
WBC: 8.3 K/uL (ref 4.0–10.5)
WBC: 12.7 K/uL — ABNORMAL HIGH (ref 4.0–10.5)
nRBC: 5.9 % — ABNORMAL HIGH (ref 0.0–0.2)
nRBC: 4.6 % — ABNORMAL HIGH (ref 0.0–0.2)

## 2023-08-04 LAB — HEPARIN LEVEL (UNFRACTIONATED): Heparin Unfractionated: 0.74 IU/mL — ABNORMAL HIGH (ref 0.30–0.70)

## 2023-08-04 LAB — APTT
aPTT: 103 seconds — ABNORMAL HIGH (ref 24–36)
aPTT: 141 seconds — ABNORMAL HIGH (ref 24–36)
aPTT: 35 seconds (ref 24–36)

## 2023-08-04 LAB — HEMOGLOBIN A1C
Hgb A1c MFr Bld: 6.2 % — ABNORMAL HIGH (ref 4.8–5.6)
Mean Plasma Glucose: 131.24 mg/dL

## 2023-08-04 LAB — BASIC METABOLIC PANEL WITH GFR
Anion gap: 24 — ABNORMAL HIGH (ref 5–15)
BUN: 31 mg/dL — ABNORMAL HIGH (ref 8–23)
CO2: 9 mmol/L — ABNORMAL LOW (ref 22–32)
Calcium: 8.2 mg/dL — ABNORMAL LOW (ref 8.9–10.3)
Chloride: 108 mmol/L (ref 98–111)
Creatinine, Ser: 1.82 mg/dL — ABNORMAL HIGH (ref 0.44–1.00)
GFR, Estimated: 30 mL/min — ABNORMAL LOW
Glucose, Bld: 212 mg/dL — ABNORMAL HIGH (ref 70–99)
Potassium: 5.2 mmol/L — ABNORMAL HIGH (ref 3.5–5.1)
Sodium: 141 mmol/L (ref 135–145)

## 2023-08-04 LAB — COOXEMETRY PANEL
Carboxyhemoglobin: 2.1 % — ABNORMAL HIGH (ref 0.5–1.5)
Carboxyhemoglobin: 1.7 % — ABNORMAL HIGH (ref 0.5–1.5)
Methemoglobin: <0.7 % (ref 0.0–1.5)
Methemoglobin: 0.7 % (ref 0.0–1.5)
O2 Saturation: 57.8 %
O2 Saturation: 66.7 %
Total hemoglobin: 8.4 g/dL — ABNORMAL LOW (ref 12.0–16.0)
Total hemoglobin: 8.8 g/dL — ABNORMAL LOW (ref 12.0–16.0)

## 2023-08-04 LAB — MRSA NEXT GEN BY PCR, NASAL: MRSA by PCR Next Gen: NOT DETECTED

## 2023-08-04 LAB — MAGNESIUM
Magnesium: 1.7 mg/dL (ref 1.7–2.4)
Magnesium: 1.1 mg/dL — ABNORMAL LOW (ref 1.7–2.4)

## 2023-08-04 LAB — ABO/RH: ABO/RH(D): O POS

## 2023-08-04 LAB — ECHOCARDIOGRAM COMPLETE
AR max vel: 1.96 cm2
AV Peak grad: 6.5 mmHg
Ao pk vel: 1.27 m/s
Area-P 1/2: 2.69 cm2
Height: 62 in
S' Lateral: 2.2 cm
Weight: 2430.35 [oz_av]

## 2023-08-04 LAB — LACTIC ACID, PLASMA
Lactic Acid, Venous: >9 mmol/L (ref 0.5–1.9)
Lactic Acid, Venous: >9 mmol/L (ref 0.5–1.9)

## 2023-08-04 LAB — PHOSPHORUS
Phosphorus: 7.5 mg/dL — ABNORMAL HIGH (ref 2.5–4.6)
Phosphorus: 4.1 mg/dL (ref 2.5–4.6)

## 2023-08-04 LAB — HIV ANTIBODY (ROUTINE TESTING W REFLEX): HIV Screen 4th Generation wRfx: NONREACTIVE

## 2023-08-04 LAB — PROTIME-INR
INR: 2.1 — ABNORMAL HIGH (ref 0.8–1.2)
Prothrombin Time: 23.6 seconds — ABNORMAL HIGH (ref 11.4–15.2)

## 2023-08-04 LAB — TROPONIN I (HIGH SENSITIVITY): Troponin I (High Sensitivity): 122 ng/L (ref <18)

## 2023-08-04 SURGERY — IR WITH ANESTHESIA
Anesthesia: General

## 2023-08-04 MED ORDER — VASOPRESSIN 20 UNITS/100 ML INFUSION FOR SHOCK
0.0000 [IU]/min | INTRAVENOUS | Status: DC
  Administered 2023-08-04 – 2023-08-05 (×3): 0.03 [IU]/min via INTRAVENOUS
  Filled 2023-08-04 (×2): qty 100

## 2023-08-04 MED ORDER — ORAL CARE MOUTH RINSE: 15.0000 mL | OROMUCOSAL | Status: DC | PRN

## 2023-08-04 MED ORDER — VITAL 1.5 CAL PO LIQD
1000.0000 mL | ORAL | Status: DC
  Administered 2023-08-04: 1000 mL

## 2023-08-04 MED ORDER — STERILE WATER FOR INJECTION IV SOLN
INTRAVENOUS | Status: DC
  Filled 2023-08-04 (×3): qty 1000

## 2023-08-04 MED ORDER — HEPARIN (PORCINE) 25000 UT/250ML-% IV SOLN
900.0000 [IU]/h | INTRAVENOUS | Status: DC
  Administered 2023-08-04: 900 [IU]/h via INTRAVENOUS
  Filled 2023-08-04: qty 250

## 2023-08-04 MED ORDER — ROCURONIUM BROMIDE 10 MG/ML (PF) SYRINGE
50.0000 mg | PREFILLED_SYRINGE | Freq: Once | INTRAVENOUS | Status: AC
  Administered 2023-08-03: 50 mg via INTRAVENOUS

## 2023-08-04 MED ORDER — MIDAZOLAM HCL 2 MG/2ML IJ SOLN
INTRAMUSCULAR | Status: AC
  Filled 2023-08-04: qty 2

## 2023-08-04 MED ORDER — VASOPRESSIN 20 UNITS/100 ML INFUSION FOR SHOCK
INTRAVENOUS | Status: AC
  Filled 2023-08-04: qty 100

## 2023-08-04 MED ORDER — SODIUM BICARBONATE 8.4 % IV SOLN: 100.0000 meq | Freq: Once | INTRAVENOUS | Status: AC

## 2023-08-04 MED ORDER — MIDAZOLAM HCL 2 MG/2ML IJ SOLN
2.0000 mg | Freq: Once | INTRAMUSCULAR | Status: AC
  Administered 2023-08-04: 2 mg via INTRAVENOUS
  Filled 2023-08-04: qty 2

## 2023-08-04 MED ORDER — IOHEXOL 300 MG/ML  SOLN
100.0000 mL | Freq: Once | INTRAMUSCULAR | Status: AC | PRN
  Administered 2023-08-04: 30 mL via INTRAVENOUS

## 2023-08-04 MED ORDER — ETOMIDATE 2 MG/ML IV SOLN
20.0000 mg | Freq: Once | INTRAVENOUS | Status: AC
  Administered 2023-08-03: 20 mg via INTRAVENOUS

## 2023-08-04 MED ORDER — PROSOURCE TF20 ENFIT COMPATIBL EN LIQD
60.0000 mL | Freq: Every day | ENTERAL | Status: DC
  Filled 2023-08-04 (×2): qty 60

## 2023-08-04 MED ORDER — LACTATED RINGERS IV BOLUS
1000.0000 mL | Freq: Once | INTRAVENOUS | Status: AC
  Administered 2023-08-04: 1000 mL via INTRAVENOUS

## 2023-08-04 MED ORDER — MILRINONE LACTATE IN DEXTROSE 20-5 MG/100ML-% IV SOLN: 0.2500 ug/kg/min | INTRAVENOUS | Status: DC

## 2023-08-04 MED ORDER — SODIUM BICARBONATE 8.4 % IV SOLN
INTRAVENOUS | Status: AC
  Administered 2023-08-04: 100 meq via INTRAVENOUS
  Filled 2023-08-04: qty 100

## 2023-08-04 MED ORDER — FENTANYL CITRATE PF 50 MCG/ML IJ SOSY
100.0000 ug | PREFILLED_SYRINGE | Freq: Once | INTRAMUSCULAR | Status: AC
  Administered 2023-08-03: 100 ug via INTRAVENOUS

## 2023-08-04 MED ORDER — INSULIN ASPART 100 UNIT/ML IJ SOLN
0.0000 [IU] | INTRAMUSCULAR | Status: DC
  Administered 2023-08-04: 4 [IU] via SUBCUTANEOUS
  Administered 2023-08-04 – 2023-08-05 (×2): 3 [IU] via SUBCUTANEOUS
  Administered 2023-08-05: 7 [IU] via SUBCUTANEOUS
  Administered 2023-08-05 (×5): 4 [IU] via SUBCUTANEOUS
  Administered 2023-08-06: 7 [IU] via SUBCUTANEOUS
  Administered 2023-08-06 (×2): 4 [IU] via SUBCUTANEOUS
  Administered 2023-08-06: 3 [IU] via SUBCUTANEOUS
  Administered 2023-08-07: 4 [IU] via SUBCUTANEOUS
  Administered 2023-08-07 – 2023-08-09 (×4): 3 [IU] via SUBCUTANEOUS
  Administered 2023-08-09: 4 [IU] via SUBCUTANEOUS
  Administered 2023-08-09: 3 [IU] via SUBCUTANEOUS

## 2023-08-04 MED ORDER — MIDAZOLAM HCL 2 MG/2ML IJ SOLN
2.0000 mg | Freq: Once | INTRAMUSCULAR | Status: AC
  Administered 2023-08-04: 2 mg via INTRAVENOUS

## 2023-08-04 MED ORDER — HEPARIN (PORCINE) 25000 UT/250ML-% IV SOLN
800.0000 [IU]/h | INTRAVENOUS | Status: AC
  Administered 2023-08-04: 750 [IU]/h via INTRAVENOUS
  Administered 2023-08-05: 600 [IU]/h via INTRAVENOUS
  Administered 2023-08-07 – 2023-08-10 (×3): 800 [IU]/h via INTRAVENOUS
  Filled 2023-08-04 (×4): qty 250

## 2023-08-04 MED ORDER — ORAL CARE MOUTH RINSE
15.0000 mL | OROMUCOSAL | Status: DC
  Administered 2023-08-04 – 2023-08-05 (×9): 15 mL via OROMUCOSAL

## 2023-08-04 MED ORDER — PERFLUTREN LIPID MICROSPHERE
1.0000 mL | INTRAVENOUS | Status: AC | PRN
  Administered 2023-08-04: 4 mL via INTRAVENOUS

## 2023-08-04 MED ORDER — CALCIUM GLUCONATE-NACL 2-0.675 GM/100ML-% IV SOLN
2.0000 g | Freq: Once | INTRAVENOUS | Status: AC
  Administered 2023-08-04: 2000 mg via INTRAVENOUS
  Filled 2023-08-04: qty 100

## 2023-08-04 MED ORDER — IOHEXOL 300 MG/ML  SOLN
150.0000 mL | Freq: Once | INTRAMUSCULAR | Status: AC | PRN
  Administered 2023-08-04: 50 mL via INTRAVENOUS

## 2023-08-04 MED ORDER — LIDOCAINE HCL 1 % IJ SOLN
INTRAMUSCULAR | Status: AC
  Filled 2023-08-04: qty 20

## 2023-08-04 MED ORDER — MILRINONE LACTATE IN DEXTROSE 20-5 MG/100ML-% IV SOLN
0.2500 ug/kg/min | INTRAVENOUS | Status: DC
  Administered 2023-08-04 – 2023-08-05 (×2): 0.25 ug/kg/min via INTRAVENOUS
  Filled 2023-08-04 (×2): qty 100

## 2023-08-04 MED ORDER — FENTANYL CITRATE (PF) 100 MCG/2ML IJ SOLN
INTRAMUSCULAR | Status: AC
  Filled 2023-08-04: qty 2

## 2023-08-04 MED ORDER — LIDOCAINE HCL 1 % IJ SOLN
20.0000 mL | Freq: Once | INTRAMUSCULAR | Status: AC
  Administered 2023-08-04: 5 mL via INTRADERMAL

## 2023-08-04 NOTE — Progress Notes (Signed)
Patient is going for STAT CT. Jocelyn [name] changed order to Routine EEG.

## 2023-08-04 NOTE — Procedures (Signed)
Interventional Radiology Procedure Note  Procedure:   US guided access RCFV.  Pulmonary angiogram and pressure Mechanical/Aspiration thrombectomy of right and left main and lobar pulmonary arteries.  Manual pressure for hemostasis   Findings: Initial angio shows residual bilateral PA thrombus, mainly upper lobes bilateral.  Final angio shows resolution of the bilateral PA thrombus, with improved perfusion of the upper lobe segmental and subsegmental branches.  Initial PA pressure: 43/9 (26) PA pressure after treating 1st side/left: 47/10 (27) PA pressure after treating 2nd side/right:  49/17 (32)  Anesthesia:  Intubated. Fentanyl ggt  Complications: None EBL: 220cc  Status:  Critical Stable, with decreasing levophed requirement at conclusion (14mcg/minute for SBP of 115) Vitals on conclusion: 110-115 SBP, HR 83, 100 O2 with 60% Fi02  Tracing: Lead II, Q wave with inverted T waves  Recommendations:  - stable to ICU - Right hip straight x 8 hours.  - pressure dressing x 8 hours.  VIR to remove suture Friday - Continue hep ggt - Monitor H&H - Do not submerge for 7 days - routine wound care after suture removal - VIR to follow   Signed,  Yvone Neu. Loreta Ave, DO, ABVM, RPVI

## 2023-08-04 NOTE — Procedures (Signed)
Patient Name: Jocelyn [name]  MRN: [id number]  Epilepsy Attending: Charlsie Quest  Referring Physician/Provider: Rejeana Brock, MD  Date: 08/04/2023 Duration: 25.20 mins  Patient history: 67yo F s/p PEA arrest getting eeg to evaluate for seizure  Level of alertness: Awake  AEDs during EEG study: versed  Technical aspects: This EEG study was done with scalp electrodes positioned according to the 10-20 International system of electrode placement. Electrical activity was reviewed with band pass filter of 1-70Hz , sensitivity of 7 uV/mm, display speed of 16mm/sec with a 60Hz  notched filter applied as appropriate. EEG data were recorded continuously and digitally stored.  Video monitoring was available and reviewed as appropriate.  Description: EEG showed continuous generalized 3 to 6 Hz theta-delta slowing. Hyperventilation and photic stimulation were not performed.     ABNORMALITY - Continuous slow, generalized  IMPRESSION: This study is suggestive of moderate diffuse encephalopathy. No seizures or epileptiform discharges were seen throughout the recording.  Everado Pillsbury Annabelle Harman

## 2023-08-04 NOTE — Progress Notes (Signed)
   08/04/23 1632  Spiritual Encounters  Type of Visit Follow up  Care provided to: Pt and family  Reason for visit Routine spiritual support  OnCall Visit No  Spiritual Framework  Presenting Themes Rituals and practive;Community and relationships  Interventions  Spiritual Care Interventions Made Prayer;Compassionate presence  Intervention Outcomes  Outcomes Connection to spiritual care   Follow up visit with patient and family. Patient waiting on the results of the EEG. Patient non verbal, communicated via writing. Daughter and God daughter present. Two other visitors entered during visit. Prayed with patient and family as well as provided spiritual support.

## 2023-08-04 NOTE — Sedation Documentation (Addendum)
ARTat start of 43/9/26 @0412  ART 47/10/27 End @0430  49/17/32

## 2023-08-04 NOTE — Progress Notes (Signed)
ANTICOAGULATION CONSULT NOTE - Follow Up Consult  Pharmacy Consult for Heparin IV Indication: pulmonary embolus  Allergies  Allergen Reactions   Actos [Pioglitazone] Other (See Comments)    REACTION: pt states INTOL w/ edema   Codeine Other (See Comments)    REACTION: vomiting   Januvia [Sitagliptin] Nausea Only   Lactose Intolerance (Gi) Diarrhea   Morphine Nausea Only and Nausea And Vomiting    REACTION: vomiting REACTION: vomiting   Repatha [Evolocumab]     WAS NOT EFFECTIVE IN LOWERING LDL    Patient Measurements: Height: 5\' 2"  (157.5 cm) Weight: 68.9 kg (151 lb 14.4 oz) IBW/kg (Calculated) : 50.1 Heparin Dosing Weight: 64.5 kg  Vital Signs: Temp: 100.6 F (38.1 C) (09/19 1931) Temp Source: Bladder (09/19 1600) BP: 151/83 (09/19 1930) Pulse Rate: 86 (09/19 1931)  Labs: Recent Labs    08/03/23 1954 08/03/23 2359 08/04/23 0032 08/04/23 0249 08/04/23 0522 08/04/23 0843 08/04/23 1123 08/04/23 1540 08/04/23 2036  HGB 11.7* 9.4*   < > 10.9* 9.9* 9.5*  --  8.5*  --   HCT 35.9* 31.0*   < > 32.9* 29.0* 28.0*  --  25.0*  --   PLT 220 146*  --  151  --   --   --   --   --   APTT  --  141*  --  103*  --   --  35  --   --   LABPROT  --  23.6*  --   --   --   --   --   --   --   INR  --  2.1*  --   --   --   --   --   --   --   HEPARINUNFRC  --   --   --   --   --   --   --   --  0.74*  CREATININE 1.73*  --   --  1.82*  --   --   --   --   --   TROPONINIHS 119* 122*  --   --   --   --   --   --   --    < > = values in this interval not displayed.    Estimated Creatinine Clearance: 26.9 mL/min (A) (by C-G formula based on SCr of 1.82 mg/dL (H)).   Medications:  Scheduled:   Chlorhexidine Gluconate Cloth  6 each Topical Daily   docusate  100 mg Per Tube BID   famotidine  20 mg Per Tube Daily   feeding supplement (PROSource TF20)  60 mL Per Tube Daily   fentaNYL (SUBLIMAZE) injection  25 mcg Intravenous Once   hydrocortisone sod succinate (SOLU-CORTEF) inj  100  mg Intravenous Q12H   insulin aspart  0-20 Units Subcutaneous Q4H   mouth rinse  15 mL Mouth Rinse Q2H   polyethylene glycol  17 g Per Tube Daily   Infusions:   sodium chloride     feeding supplement (VITAL 1.5 CAL)     fentaNYL infusion INTRAVENOUS 75 mcg/hr (08/04/23 1800)   heparin 900 Units/hr (08/04/23 1800)   milrinone 0.25 mcg/kg/min (08/04/23 2035)   norepinephrine (LEVOPHED) Adult infusion 16 mcg/min (08/04/23 2027)   sodium bicarbonate 150 mEq in sterile water 1,150 mL infusion 50 mL/hr at 08/04/23 1800   vasopressin 0.03 Units/min (08/04/23 2000)    Assessment: 67 years of age female with saddle PE initially on IV Heparin then coded and received  TNK at 2300 PM. Last aPTT 103-trending down. Labs have been delayed, aPTT now down to 35. CT Head - negative for bleed per Dr. Francine Graven, ok to start.   Heparin level 0.74 (supratherapeutic) on heparin 900 units/hr. Per d/w RN, heparin infusing through internal jugular line, level drawn using set set with infusion paused for 10 minutes prior to draw. No bleeding or infusion issues noted per RN.  Goal of Therapy:  Heparin level 0.3 to 0.5 units/ml for 24 hrs then can target 0.3 to 0.7. Monitor platelets by anticoagulation protocol: Yes   Plan:  Hold heparin drip x 1 hour Decrease heparin infusion to 750 units/hr Check heparin level 8hr after resuming drip Daily heparin level and CBC  Loralee Pacas, PharmD, BCPS Please refer to [facility] for [facility] numbers 08/04/2023,9:21 PM

## 2023-08-04 NOTE — Progress Notes (Signed)
   08/04/23 0804  Spiritual Encounters  Type of Visit Initial  Care provided to: [facility] partners present during encounter Nurse;Physician  Reason for visit Code  OnCall Visit Yes   Chaplain responded to Code Blue.  Upon arrival chaplain found Pt daughter and 2 other support persons standing outside the room. They immediately asked me to pray with them.  Chaplain then provided support through compassionate presence and reflective listening. When code was resolved MD came to waiting room with family and gave update that Pt's condition is critical but he is hopeful. Pt's daughter appreciative of the update. Chaplain remained with a brief while, offering prayer one more time, and then left family to process together.  Chaplain services remain available by Spiritual Consult or for emergent cases, paging [phone]  Chaplain Raelene Bott, MDiv [email or web address] [phone]

## 2023-08-04 NOTE — Procedures (Signed)
Central Venous Catheter Insertion Procedure Note  Jocelyn [name]  [id number]  September 27, 1956  Date:08/04/23  Time:9:58 AM   Provider Performing:Analucia Hush B Latonda Larrivee   Procedure: Insertion of Non-tunneled Central Venous [phone]) with US guidance (10626)   Indication(s) Medication administration  Consent Risks of the procedure as well as the alternatives and risks of each were explained to the patient and/or caregiver.  Consent for the procedure was obtained and is signed in the bedside chart  Anesthesia Topical only with 1% lidocaine   Timeout Verified patient identification, verified procedure, site/side was marked, verified correct patient position, special equipment/implants available, medications/allergies/relevant history reviewed, required imaging and test results available.  Sterile Technique Maximal sterile technique including full sterile barrier drape, hand hygiene, sterile gown, sterile gloves, mask, hair covering, sterile ultrasound probe cover (if used).  Procedure Description Area of catheter insertion was cleaned with chlorhexidine and draped in sterile fashion.  With real-time ultrasound guidance a central venous catheter was placed into the left internal jugular vein. Nonpulsatile blood flow and easy flushing noted in all ports.  The catheter was sutured in place and sterile dressing applied.  Complications/Tolerance None; patient tolerated the procedure well. Chest X-ray is ordered to verify placement for internal jugular or subclavian cannulation.   Chest x-ray is not ordered for femoral cannulation.  EBL Minimal  Specimen(s) None

## 2023-08-04 NOTE — Progress Notes (Signed)
EEG complete - results pending 

## 2023-08-04 NOTE — Progress Notes (Signed)
NAME:  Jocelyn [name], MRN:  [id number], DOB:  [birth year], LOS: 1 ADMISSION DATE:  08/03/2023, CONSULTATION DATE:  08/04/2023  REFERRING MD:  Doran Durand EDP, CHIEF COMPLAINT: Shortness of breath  History of Present Illness:  67 year old woman presented to ED with shortness of breath for 1 week. She also experienced presyncope. She was evaluated by cardiology on 9/12 for vague symptoms of fatigue, myocardial perfusion study was performed 9/17 which was normal. She was hypertensive on arrival to the ED, hypothermic 95 7, hypoxic requiring 6 L nasal cannula. Initial labs showed lactate of 7.6.  Code sepsis was initiated, COVID testing was negative. CT angiogram chest was obtained which showed saddle pulm embolism as well as extensive bilateral emboli with RV/LV ratio 1.7. Soon after arrival to ICU, she developed PEA arrest with ROSC after 5 minutes of CPR, epi x 1, TNK given, intubated post arrest. Admitted to ICU for ongoing care.  Pertinent  Medical History  Inferior wall STEMI 06/2017, status post DES to RCA Right subclavian lusoria variant Hyperlipidemia Polymyositis on azathioprine and steroids Sarcoidosis, inactive by imaging  Significant Hospital Events: Including procedures, antibiotic start and stop dates in addition to other pertinent events   9/18 PEA arrest after arrival to ICU 9/19 Concern for seizure like activity  Interim History / Subjective:   Underwent pulmonary angiography with mechanical/aspiration thrombectomy of R and L main and lobar pulmonary arteries with IR overnight. Had resolution of bilateral PA thrombus and improved perfusion of upper lobe segmental and subsegmental branches. Remains on levophed. C/f seizure/seizure-like activity based on exam this AM.  Objective   Blood pressure 93/73, pulse 86, temperature (!) 92.8 F (33.8 C), temperature source Rectal, resp. rate 20, height 5\' 2"  (1.575 m), weight 68.9 kg, SpO2 100%.    Vent Mode: PRVC FiO2 (%):  [60 %-100  %] 60 % Set Rate:  [20 bmp-30 bmp] 30 bmp Vt Set:  [480 mL] 480 mL PEEP:  [5 cmH20] 5 cmH20 Plateau Pressure:  [16 cmH20-22 cmH20] 22 cmH20   Intake/Output Summary (Last 24 hours) at 08/04/2023 0659 Last data filed at 08/04/2023 0200 Gross per 24 hour  Intake 2432.57 ml  Output --  Net 2432.57 ml   Filed Weights   08/03/23 1937  Weight: 68.9 kg    Examination: Constitutional:Elderly female sedated and intubated in bed. Bear hugger in place. In no acute distress. Cardio:Regular rate and rhythm. No murmurs, rubs, or gallops. Pulm:Clear to auscultation bilaterally. Normal work of breathing on room air. Abdomen:Soft, nontender, nondistended. ZDG:UYQIHKVQ for extremity edema. Skin:Warm and dry. Neuro:Sedated, does not follow commands. Bilateral UE with jerking movement. Eyes rolling back.   Labs: WBC 12.7 (8.3), Hgb 10.9, platelets 151 BMP: K 5.2 (4.4), CO2 9 (12), BUN 31 (34)/Cr 1.82 (1.73), Ca 8.2, Ca 30, AG 24 Mg 1.7 Phos 7.5 Blood cultures pending  Resolved Hospital Problem list   N/A  Assessment & Plan:   Massive pulmonary embolism w/ cor pulmonale Obstructive shock PEA arrest w/ ROSC Acute respiratory failure with hypoxia 2/2 above S/p TNK which was given during PEA arrest, intervention by IR. On heparin now. Ongoing requirement for vasopressor support.  Plan: -F/u echocardiogram, vascular US -Continue heparin IV infusion -Continue levophed, will add on vasopressin; titrate rates as able with MAP goal >65 -Continue full ventilator support, adjust settings as indicated -VAP, PAD protocol in place; RASS goal 0 to -1 -Follow ABGs -If secretions develop, plan to send tracheal aspirate and cover for aspiration in setting of PEA arrest  C/f seizure Patient with bilateral UE jerking, eyes rolling back, not able to follow commands for me. S/p TNK overnight, has been on heparin infusion. These findings were not positive on admission. Anoxic brain injury versus bleed at top  of differential. Symptoms resolved after IV versed 2 mg x1.  Plan: -STAT EEG ordered -STAT CT head ordered -Neurology consult placed  CAD s/p DES to RCA Plan: -Hold home plavix, losartan and cardizem  AKI Metabolic acidosis Suspect 2/2 shock, PEA arrest. S/p 2L LR boluses, received 2 amps of sodium bicarbonate, now on infusion. Has normal renal function at baseline. Minimal documented UOP overnight; will need to closely monitor not only renal labs but UOP for renal failure. Plan: -Trend renal function, UOP -Continue sodium bicarbonate infusion 150 mL/h  Polymyositis  No NG/OG access at this time. On azathioprine as OP. Plan: -Continue stress-dose steroids  Type 2 diabetes mellitus HbA1c 6.2%. No nutrition access at this time. Plan: -SSI -Will place order for [employer] so that this can be placed tomorrow  Best Practice (right click and "Reselect all SmartList Selections" daily)   Diet/type: NPO DVT prophylaxis: systemic heparin GI prophylaxis: N/A Lines: Cenral line Foley:  Yes, and it is still needed Code Status:  full code Last date of multidisciplinary goals of care discussion: 09/18  Labs   CBC: Recent Labs  Lab 08/03/23 1954 08/03/23 2359 08/04/23 0032 08/04/23 0249 08/04/23 0522  WBC 6.7 8.3  --  12.7*  --   NEUTROABS 4.8  --   --   --   --   HGB 11.7* 9.4* 9.2* 10.9* 9.9*  HCT 35.9* 31.0* 27.0* 32.9* 29.0*  MCV 108.5* 115.2*  --  111.9*  --   PLT 220 146*  --  151  --     Basic Metabolic Panel: Recent Labs  Lab 08/03/23 1954 08/04/23 0032 08/04/23 0249 08/04/23 0522  NA 140 139 141 137  K 5.2* 4.4 5.2* 4.8  CL 105  --  108  --   CO2 12*  --  9*  --   GLUCOSE 101*  --  212*  --   BUN 34*  --  31*  --   CREATININE 1.73*  --  1.82*  --   CALCIUM 9.6  --  8.2*  --   MG  --   --  1.7  --   PHOS  --   --  7.5*  --    GFR: Estimated Creatinine Clearance: 26.9 mL/min (A) (by C-G formula based on SCr of 1.82 mg/dL (H)). Recent Labs  Lab  08/03/23 1954 08/03/23 2359 08/04/23 0249  WBC 6.7 8.3 12.7*  LATICACIDVEN 7.6* >9.0*  --     Liver Function Tests: No results for input(s): "AST", "ALT", "ALKPHOS", "BILITOT", "PROT", "ALBUMIN" in the last 168 hours. No results for input(s): "LIPASE", "AMYLASE" in the last 168 hours. No results for input(s): "AMMONIA" in the last 168 hours.  ABG    Component Value Date/Time   PHART 7.105 (LL) 08/04/2023 0522   PCO2ART 28.2 (L) 08/04/2023 0522   PO2ART 157 (H) 08/04/2023 0522   HCO3 8.9 (L) 08/04/2023 0522   TCO2 10 (L) 08/04/2023 0522   ACIDBASEDEF 19.0 (H) 08/04/2023 0522   O2SAT 99 08/04/2023 0522     Coagulation Profile: Recent Labs  Lab 08/03/23 2359  INR 2.1*    Cardiac Enzymes: No results for input(s): "CKTOTAL", "CKMB", "CKMBINDEX", "TROPONINI" in the last 168 hours.  HbA1C: Hgb A1c MFr Bld  Date/Time Value  Ref Range Status  08/03/2023 11:59 PM 6.2 (H) 4.8 - 5.6 % Final    Comment:    (NOTE) Pre diabetes:          5.7%-6.4%  Diabetes:              >6.4%  Glycemic control for   <7.0% adults with diabetes   08/17/2018 04:20 PM 6.5 (H) 4.8 - 5.6 % Final    Comment:    (NOTE)         Prediabetes: 5.7 - 6.4         Diabetes: >6.4         Glycemic control for adults with diabetes: <7.0     CBG: Recent Labs  Lab 08/03/23 2257 08/03/23 2349 08/04/23 0110 08/04/23 0613  GLUCAP 53* 194* 188* 225*    Review of Systems:   Unable to obtain.  Past Medical History:  She,  has a past medical history of Acute bronchitis, Allergic rhinitis, cause unspecified, Anemia, Anxiety, B12 deficiency, BV (bacterial vaginosis) (06/22/1996), Calculus of kidney, Calculus of kidney, Coronary atherosclerosis of unspecified type of vessel, native or graft, Dizziness, Essential hypertension, benign, Fibroid (2003), Fibromyalgia, H/O dysmenorrhea (2008), H/O varicella, Headache(784.0), HSV-2 infection (2009), Hyperplastic colon polyp (05/16/2014), Irritable bowel syndrome,  Meniere's disease, unspecified, Menses, irregular (2003), Myalgia and myositis, unspecified, Myocardial infarction (HCC), Obstructive sleep apnea (adult) (pediatric), Perimenopausal symptoms (2003), Pure hypercholesterolemia, Sarcoidosis, Type II or unspecified type diabetes mellitus without mention of complication, not stated as uncontrolled, Unspecified venous (peripheral) insufficiency, Vitamin D deficiency, Vulvitis (2010), and Yeast infection.   Surgical History:   Past Surgical History:  Procedure Laterality Date   COLONOSCOPY     CORONARY STENT INTERVENTION N/A 06/15/2017   Procedure: CORONARY STENT INTERVENTION;  Surgeon: Corky Crafts, MD;  Location: [facility] INVASIVE CV LAB;  Service: Cardiovascular;  Laterality: N/A;   heart catherization     LEFT HEART CATH AND CORONARY ANGIOGRAPHY N/A 06/15/2017   Procedure: LEFT HEART CATH AND CORONARY ANGIOGRAPHY;  Surgeon: Corky Crafts, MD;  Location[facility] INVASIVE CV LAB;  Service: Cardiovascular;  Laterality: N/A;   TONSILLECTOMY       Social History:   reports that she has never smoked. She has never used smokeless tobacco. She reports that she does not drink alcohol and does not use drugs.   Family History:  Her family history includes Other in an other family member. There is no history of Colon cancer, Esophageal cancer, Rectal cancer, or Stomach cancer. She was adopted.   Allergies Allergies  Allergen Reactions   Actos [Pioglitazone] Other (See Comments)    REACTION: pt states INTOL w/ edema   Codeine Other (See Comments)    REACTION: vomiting   Januvia [Sitagliptin] Nausea Only   Lactose Intolerance (Gi) Diarrhea   Morphine Nausea Only and Nausea And Vomiting    REACTION: vomiting REACTION: vomiting   Repatha [Evolocumab]     WAS NOT EFFECTIVE IN LOWERING LDL     Home Medications  Prior to Admission medications   Medication Sig Start Date End Date Taking? Authorizing Provider  allopurinol (ZYLOPRIM) 100 MG tablet  Take 1 tablet by mouth daily as needed. 09/25/21   [provider]  ALPRAZolam Prudy Feeler) 0.5 MG tablet Take 0.5 mg by mouth 3 (three) times daily. 07/15/23   [provider]  azaTHIOprine (IMURAN) 50 MG tablet Take 1 tablet by mouth daily.    [provider]  clopidogrel (PLAVIX) 75 MG tablet TAKE 1 TABLET BY MOUTH  EVERY DAY 02/01/23   Corky Crafts, MD  diltiazem [facility] CD) 240 MG 24 hr capsule TAKE 1 CAPSULE(240 MG) BY MOUTH DAILY 04/04/23   Corky Crafts, MD  Empagliflozin-metFORMIN HCl ER 25-1000 MG TB24 Take 1 tablet by mouth daily.    [provider]  GEMTESA 75 MG TABS Take 1 tablet by mouth daily. 12/31/21   [provider]  icosapent Ethyl (VASCEPA) 1 g capsule TAKE 2 CAPSULES(2 GRAMS) BY MOUTH TWICE DAILY 02/03/23   Corky Crafts, MD  losartan (COZAAR) 100 MG tablet TAKE 1 TABLET(100 MG) BY MOUTH DAILY 03/03/23   Corky Crafts, MD  methylPREDNISolone (MEDROL) 4 MG tablet Take 4 mg by mouth daily. 07/08/23   [provider]  metoprolol tartrate (LOPRESSOR) 50 MG tablet TAKE 1 TABLET(50 MG) BY MOUTH TWICE DAILY 04/04/23   Corky Crafts, MD  NEXLETOL 180 MG TABS TAKE 1 TABLET BY MOUTH DAILY 07/20/23   Corky Crafts, MD  nitroGLYCERIN (NITROSTAT) 0.4 MG SL tablet Place 1 tablet (0.4 mg total) under the tongue every 5 (five) minutes as needed for chest pain. 02/08/22   Corky Crafts, MD  [facility] VERIO test strip CHECK BLOOD SUGAR TID AS DIRECTED 10/04/19   [provider]  PRALUENT 150 MG/ML SOAJ INJECT 150 MG INTO THE SKIN EVERY 14 DAYS 08/06/22   Corky Crafts, MD  spironolactone (ALDACTONE) 25 MG tablet TAKE 1 TABLET BY MOUTH EVERY DAY 02/01/23   Corky Crafts, MD     Critical care time: 45 minutes      Champ Mungo, DO Internal Medicine PGY-3 08/04/2023

## 2023-08-04 NOTE — Plan of Care (Signed)
  Problem: Education: Goal: Knowledge of General Education information will improve Description: Including pain rating scale, medication(s)/side effects and non-pharmacologic comfort measures Outcome: Progressing   Problem: Health Behavior/Discharge Planning: Goal: Ability to manage health-related needs will improve Outcome: Progressing   Problem: Clinical Measurements: Goal: Ability to maintain clinical measurements within normal limits will improve Outcome: Progressing Goal: Will remain free from infection Outcome: Progressing Goal: Diagnostic test results will improve Outcome: Progressing Goal: Respiratory complications will improve Outcome: Progressing Goal: Cardiovascular complication will be avoided Outcome: Progressing   Problem: Activity: Goal: Risk for activity intolerance will decrease Outcome: Progressing   Problem: Nutrition: Goal: Adequate nutrition will be maintained Outcome: Progressing   Problem: Coping: Goal: Level of anxiety will decrease Outcome: Progressing   Problem: Elimination: Goal: Will not experience complications related to bowel motility Outcome: Progressing Goal: Will not experience complications related to urinary retention Outcome: Progressing   Problem: Pain Managment: Goal: General experience of comfort will improve Outcome: Progressing   Problem: Safety: Goal: Ability to remain free from injury will improve Outcome: Progressing   Problem: Skin Integrity: Goal: Risk for impaired skin integrity will decrease Outcome: Progressing   Problem: Safety: Goal: Non-violent Restraint(s) Outcome: Progressing   Problem: Education: Goal: Ability to describe self-care measures that may prevent or decrease complications (Diabetes Survival Skills Education) will improve Outcome: Progressing Goal: Individualized Educational Video(s) Outcome: Progressing   Problem: Coping: Goal: Ability to adjust to condition or change in health will  improve Outcome: Progressing   Problem: Fluid Volume: Goal: Ability to maintain a balanced intake and output will improve Outcome: Progressing   Problem: Health Behavior/Discharge Planning: Goal: Ability to identify and utilize available resources and services will improve Outcome: Progressing Goal: Ability to manage health-related needs will improve Outcome: Progressing   Problem: Metabolic: Goal: Ability to maintain appropriate glucose levels will improve Outcome: Progressing   Problem: Nutritional: Goal: Maintenance of adequate nutrition will improve Outcome: Progressing Goal: Progress toward achieving an optimal weight will improve Outcome: Progressing   Problem: Skin Integrity: Goal: Risk for impaired skin integrity will decrease Outcome: Progressing   Problem: Tissue Perfusion: Goal: Adequacy of tissue perfusion will improve Outcome: Progressing

## 2023-08-04 NOTE — Progress Notes (Signed)
   08/04/23 1130  Spiritual Encounters  Type of Visit Initial  Care provided to: Pt and family  Conversation partners present during encounter Nurse  Reason for visit Code   Follow-up to code blue - visited with patient's daughter - patient unavailable as EKG being performed. Will return

## 2023-08-04 NOTE — Consult Note (Signed)
Neurology Consultation  Reason for Consult: seizure like activity  Referring Physician: Dr. Francine Graven  CC: [facility]  History is obtained from:medical record  HPI: Jocelyn [name] is a 67 y.o. female with past medical history of anxiety, HTN, HLD, DM, CAD, MI, B12 deficiency, fibromyalgia, menieres disease, OSA, sarcoidosis, Vitamin D deficiency who presents to the Ed for evaluation of SOB for the past week. In the ED she was hypertensive, hypothermic and hypoxic with a lactic acid of 7.6. CTA chest revealed saddle pulm embolism as well as extensive bilateral emboli with RV/LV ratio 1.7 and was transferred to [facility].   She had a PEA arrest with ROSC after 5 minutes of CPR, received TNK and intubated after arrest. Overnight she underwent pulmonary angiography with mechanical thrombectomy of R & L main & lobar pulmonary arteries.   This morning while being examined by primary team there was concern for seizure like activity as she was noted to not be following commands, eyes had rolled back in her head and had bilateral upper extremity jerking. She was given versed and the jerking movements stopped.  EEG and Ct head was ordered. Neurology consulted for assistance   On exam she is intubated. She is on fentanyl, levophed, vasopressin and bicarb gtt. Eyes are closed initially, she opens eyes to voice, follows commands, attempting to mouth words around ET tube, moving all extremities, sensation appears to be intact to noxious stimuli.    ROS: Unable to obtain due to altered mental status.   Past Medical History:  Diagnosis Date   Acute bronchitis    Allergic rhinitis, cause unspecified    Anemia    Anxiety    B12 deficiency    BV (bacterial vaginosis) 06/22/1996   Calculus of kidney    Calculus of kidney    Coronary atherosclerosis of unspecified type of vessel, native or graft    Dizziness    Essential hypertension, benign    Fibroid 2003   Fibromyalgia    H/O dysmenorrhea 2008   H/O varicella     Headache(784.0)    frequently   HSV-2 infection 2009   Hyperplastic colon polyp 05/16/2014   Irritable bowel syndrome    Meniere's disease, unspecified    Menses, irregular 2003   Myalgia and myositis, unspecified    Myocardial infarction (HCC)    Obstructive sleep apnea (adult) (pediatric)    Perimenopausal symptoms 2003   Pure hypercholesterolemia    Sarcoidosis    Type II or unspecified type diabetes mellitus without mention of complication, not stated as uncontrolled    Unspecified venous (peripheral) insufficiency    Vitamin D deficiency    Vulvitis 2010   Yeast infection      Family History  Adopted: Yes  Problem Relation Age of Onset   Other Other        ADOPTED   Colon cancer Neg Hx    Esophageal cancer Neg Hx    Rectal cancer Neg Hx    Stomach cancer Neg Hx      Social History:   reports that she has never smoked. She has never used smokeless tobacco. She reports that she does not drink alcohol and does not use drugs.  Medications  Current Facility-Administered Medications:    0.9 %  sodium chloride infusion, 250 mL, Intravenous, Continuous, Hoffman, Clarene Critchley, NP   Chlorhexidine Gluconate Cloth 2 % PADS 6 each, 6 each, Topical, Daily, Oretha Milch, MD   docusate (COLACE) 50 MG/5ML liquid 100 mg, 100 mg, Per  Tube, BID, Oretha Milch, MD   famotidine (PEPCID) tablet 20 mg, 20 mg, Per Tube, Daily, Duayne Cal, NP   fentaNYL (SUBLIMAZE) bolus via infusion 25-100 mcg, 25-100 mcg, Intravenous, Q15 min PRN, Oretha Milch, MD   fentaNYL (SUBLIMAZE) injection 25 mcg, 25 mcg, Intravenous, Once, Oretha Milch, MD   fentaNYL in NS (4mcg/ml) infusion-PREMIX, 25-200 mcg/hr, Intravenous, Continuous, Oretha Milch, MD, Last Rate: 2.5 mL/hr at 08/04/23 0700, 25 mcg/hr at 08/04/23 0700   hydrocortisone sodium succinate (SOLU-CORTEF) 100 MG injection 100 mg, 100 mg, Intravenous, Q12H, Cyril Mourning V, MD, 100 mg at 08/04/23 0159   insulin aspart (novoLOG)  injection 0-15 Units, 0-15 Units, Subcutaneous, Q4H, Duayne Cal, NP, 5 Units at 08/04/23 0806   norepinephrine (LEVOPHED) 4mg  in (0.016 mg/mL) premix infusion, 2-30 mcg/min, Intravenous, Titrated, Duayne Cal, NP, Last Rate: 75 mL/hr at 08/04/23 0959, 20 mcg/min at 08/04/23 0959   Oral care mouth rinse, 15 mL, Mouth Rinse, PRN, Oretha Milch, MD   polyethylene glycol (MIRALAX / GLYCOLAX) packet 17 g, 17 g, Per Tube, Daily, Oretha Milch, MD   sodium bicarbonate 150 mEq in sterile water 1,150 mL infusion, , Intravenous, Continuous, Martina Sinner, MD, Last Rate: 150 mL/hr at 08/04/23 0940, Rate Change at 08/04/23 0940   vasopressin (PITRESSIN) 20 Units in 100 mL (0.2 unit/mL) infusion-*FOR SHOCK*, 0-0.03 Units/min, Intravenous, Continuous, Martina Sinner, MD, Last Rate: 9 mL/hr at 08/04/23 0949, 0.03 Units/min at 08/04/23 0949   Exam: Current vital signs: BP 108/80   Pulse 76   Temp (!) 92.5 F (33.6 C)   Resp (!) 30   Ht 5\' 2"  (1.575 m)   Wt 68.9 kg   SpO2 100%   BMI 27.78 kg/m  Vital signs in last 24 hours: Temp:  [92.5 F (33.6 C)-95.7 F (35.4 C)] 92.5 F (33.6 C) (09/19 0700) Pulse Rate:  [70-112] 76 (09/19 0700) Resp:  [19-45] 30 (09/19 0700) BP: (66-177)/(17-114) 108/80 (09/19 0700) SpO2:  [91 %-100 %] 100 % (09/19 0700) Arterial Line BP: (43-49)/(9-17) 49/17 (09/19 0430) FiO2 (%):  [50 %-100 %] 50 % (09/19 0715) Weight:  [68.9 kg] 68.9 kg (09/18 1937)  GENERAL: critically ill  HEENT: - Normocephalic and atraumatic, dry mm, sclera edema bilaterally, ET tube in place LUNGS - Clear to auscultation bilaterally with no wheezes CV - S1S2 RRR, no m/r/g, equal pulses bilaterally. ABDOMEN - Soft, nontender, nondistended with normoactive BS Ext: warm, well perfused, intact peripheral pulses, no edema  NEURO:  Mental Status: eyes are closed initially, opens eyes to voice, nods appropriately to questions, follows commands Language: speech is unable to  assess due to Et Tube. Nods appropriately to questions  Cranial Nerves: PERRL EOMI- nystagmus noted on left lateral gaze, visual fields full, unable to assess facial asymmetry due to ET tube, facial sensation intact, hearing intact, tongue/uvula/soft palate midline, normal sternocleidomastoid and trapezius muscle strength. No evidence of tongue atrophy or fibrillations Motor: moving all 4 extremities antigravity and spontaneous Tone: is normal and bulk is normal Sensation- responds to noxious stimuli Coordination: Unable to assess  Gait- deferred Labs I have reviewed labs in epic and the results pertinent to this consultation are:  CBC    Component Value Date/Time   WBC 12.7 (H) 08/04/2023 0249   RBC 2.94 (L) 08/04/2023 0249   HGB 9.5 (L) 08/04/2023 0843   HCT 28.0 (L) 08/04/2023 0843   PLT 151 08/04/2023 0249   MCV 111.9 (  H) 08/04/2023 0249   MCH 37.1 (H) 08/04/2023 0249   MCHC 33.1 08/04/2023 0249   RDW 15.9 (H) 08/04/2023 0249   LYMPHSABS 1.3 08/03/2023 1954   MONOABS 0.6 08/03/2023 1954   EOSABS 0.0 08/03/2023 1954   BASOSABS 0.0 08/03/2023 1954    CMP     Component Value Date/Time   NA 137 08/04/2023 0843   NA 146 (H) 11/06/2019 1519   K 4.4 08/04/2023 0843   CL 108 08/04/2023 0249   CO2 9 (L) 08/04/2023 0249   GLUCOSE 212 (H) 08/04/2023 0249   BUN 31 (H) 08/04/2023 0249   BUN 13 11/06/2019 1519   CREATININE 1.82 (H) 08/04/2023 0249   CREATININE 0.83 06/01/2016 1254   CALCIUM 8.2 (L) 08/04/2023 0249   PROT 6.8 01/13/2021 1824   PROT 7.0 04/16/2020 1505   ALBUMIN 3.9 01/13/2021 1824   ALBUMIN 4.4 04/16/2020 1505   AST 22 01/13/2021 1824   ALT 13 01/13/2021 1824   ALKPHOS 58 01/13/2021 1824   BILITOT 0.7 01/13/2021 1824   BILITOT 0.3 04/16/2020 1505   GFRNONAA 30 (L) 08/04/2023 0249   GFRAA 105 11/06/2019 1519    Lipid Panel     Component Value Date/Time   CHOL 205 (H) 12/16/2022 1608   TRIG 179 (H) 12/16/2022 1608   HDL 38 (L) 12/16/2022 1608   CHOLHDL  5.4 (H) 12/16/2022 1608   CHOLHDL 4 05/30/2018 1444   VLDL 36.2 05/30/2018 1444   LDLCALC 135 (H) 12/16/2022 1608   LDLDIRECT 137 (H) 12/16/2022 1608   LDLDIRECT 91.6 08/14/2014 0822    Lab Results  Component Value Date   HGBA1C 6.2 (H) 08/03/2023      Imaging I have reviewed the images obtained:  CT-head- ordered   Assessment:   67 y.o. female who presents to the Ed for evaluation of SOB for the past week. In the ED she was hypertensive, hypothermic and hypoxic with a lactic acid of 7.6. CTA chest revealed saddle pulm embolism as well as extensive bilateral emboli with RV/LV ratio 1.7   She had a PEA arrest with ROSC after 5 minutes of CPR, received TNK and intubated after arrest. Overnight she underwent pulmonary angiography with mechanical thrombectomy of R & L main & lobar pulmonary arteries.   This morning there was concern for seizure like activity as she was noted to not be following commands, eyes had rolled back in her head and had bilateral upper extremity jerking. She was given versed and the jerking movements stopped.   Recommendations: - Routine EEG (already ordered by primary team) - Ct head (already ordered by primary team) - May need to consider MRI brain  - Further recommendations after the above tests are completed  - Neurology will continue to follow   Gevena Mart DNP, ACNPC-AG  [facility]  I have seen the patient reviewed the above note.  Patient had an episode of concern for possible seizure.  This aborted with Versed.  She has no history of seizures.  If she were to have any other episodes of concern, I would load with Keppra and continue this.  Convulsive syncope would be the primary differential, but we argued against based on the duration.  CT has been obtained at the time of finalizing this note, and there is some question of possible dural venous sinus thrombosis, she is already being anticoagulated for her PE which would be the treatment  for this.  We can get an MRI/MRV to further evaluate, my suspicion  is that this represents persistent contrast from her earlier evaluation.  Workup as above, neurology will follow.  Ritta Slot, MD [facility] [phone]  If 7pm- 7am, please page neurology on call as listed in AMION.

## 2023-08-04 NOTE — Hospital Course (Signed)
[phone]

## 2023-08-04 NOTE — Progress Notes (Signed)
RN admitting and assessing patient. Patient complaining of a 10/10 back pain. Patient leaned against the side of the bed and became unresponsive.  CPR began and code initiated, see code sheet.

## 2023-08-04 NOTE — Progress Notes (Signed)
Initial Nutrition Assessment  DOCUMENTATION CODES:  Not applicable  INTERVENTION:  Initiate tube feeding via OGT. Exchange for cortrak tomorrow: Vital 1.5 at 40 ml/h (960 ml per day) Initiate at 93mL/h and advance by 10mL q12h Prosource TF20 60 ml 1x/d Provides 1520 kcal, 85 gm protein, 733 ml free water daily  NUTRITION DIAGNOSIS:   Inadequate oral intake related to inability to eat as evidenced by NPO status.  GOAL:   Patient will meet greater than or equal to 90% of their needs  MONITOR:   TF tolerance, I & O's, Vent status, Labs  REASON FOR ASSESSMENT:   Ventilator    ASSESSMENT:   Pt with hx of sarcoidosis, HTN, HLD, DM type 2, IBS, and hx MI presented to ED with worsening SOB. Arrested x 2 upon transfer to [facility] and intubated.  9/18 - intubated   Patient is currently intubated on ventilator support. OGT placed this AM, no imaging yet but RN aware. Resident plans to have cortrak placed tomorrow. Discussed in rounds, ok to initiate TF.  No family in room at the time of assessment. Pt does wake to touch and attempted to mouth words. No muscle of fat deficits noted, but does have some edema. Pt requiring pressor support x 2. Will initiate feeds slowly and advance slowly to allow time for hemodynamic stability.  MV: 14 L/min Temp (24hrs), Avg:94.1 F (34.5 C), Min:92.5 F (33.6 C), Max:95.7 F (35.4 C)   Intake/Output Summary (Last 24 hours) at 08/04/2023 1223 Last data filed at 08/04/2023 1200 Gross per 24 hour  Intake 4524.13 ml  Output 430 ml  Net 4094.13 ml  Net IO Since Admission: 4,094.13 mL [08/04/23 1223]  Admit weight: 68.9 kg  Nutritionally Relevant Medications: Scheduled Meds:  docusate  100 mg Per Tube BID   famotidine  20 mg Per Tube Daily   insulin aspart  0-15 Units Subcutaneous Q4H   polyethylene glycol  17 g Per Tube Daily   Continuous Infusions:  norepinephrine (LEVOPHED) Adult infusion 18 mcg/min (08/04/23 0700)   sodium  bicarbonate 150 mEq in sterile water 1,150 mL infusion 100 mL/hr at 08/04/23 0700   Labs Reviewed: BUN 31, creatinine 1.82 Phosphorus 7.5 CBG ranges from 53-241 mg/dL over the last 24 hours HgbA1c 6.2%  NUTRITION - FOCUSED PHYSICAL EXAM: Flowsheet Row Most Recent Value  Orbital Region No depletion  Upper Arm Region No depletion  Thoracic and Lumbar Region No depletion  Buccal Region No depletion  Temple Region No depletion  Clavicle Bone Region No depletion  Clavicle and Acromion Bone Region No depletion  Scapular Bone Region No depletion  Dorsal Hand No depletion  Patellar Region No depletion  Anterior Thigh Region No depletion  Posterior Calf Region No depletion  Edema (RD Assessment) Moderate  [hands, BLE]  Hair Reviewed  Eyes Reviewed  Mouth Reviewed  Skin Reviewed  Nails Reviewed    Diet Order:   Diet Order             Diet NPO time specified  Diet effective now                   EDUCATION NEEDS:  Not appropriate for education at this time  Skin:  Skin Assessment: Reviewed RN Assessment  Last BM:  9/18  Height:  Ht Readings from Last 1 Encounters:  08/03/23 5\' 2"  (1.575 m)    Weight:  Wt Readings from Last 1 Encounters:  08/03/23 68.9 kg    Ideal Body Weight:  50 kg  BMI:  Body mass index is 27.78 kg/m.  Estimated Nutritional Needs:  Kcal:  1500-1700 kcal/d Protein:  80-95g/d Fluid:  1.5-1.7L/d    Greig Castilla, RD, LDN Clinical Dietitian RD pager # available in [facility]  After hours/weekend pager # available in [facility]

## 2023-08-04 NOTE — Progress Notes (Signed)
ANTICOAGULATION CONSULT NOTE - Initial Consult  Pharmacy Consult for Heparin Indication: pulmonary embolus  Allergies  Allergen Reactions   Actos [Pioglitazone] Other (See Comments)    REACTION: pt states INTOL w/ edema   Codeine Other (See Comments)    REACTION: vomiting   Januvia [Sitagliptin] Nausea Only   Lactose Intolerance (Gi) Diarrhea   Morphine Nausea Only and Nausea And Vomiting    REACTION: vomiting REACTION: vomiting   Repatha [Evolocumab]     WAS NOT EFFECTIVE IN LOWERING LDL    Patient Measurements: Height: 5\' 2"  (157.5 cm) Weight: 68.9 kg (151 lb 14.4 oz) IBW/kg (Calculated) : 50.1 Heparin Dosing Weight: 64.5 kg  Vital Signs: Temp: 95.3 F (35.2 C) (09/18 2013) Temp Source: Temporal (09/18 2013) BP: 108/80 (09/18 2336) Pulse Rate: 112 (09/18 2336)  Labs: Recent Labs    08/03/23 1954 08/03/23 2359 08/04/23 0032  HGB 11.7* 9.4* 9.2*  HCT 35.9* 31.0* 27.0*  PLT 220 146*  --   APTT  --  141*  --   LABPROT  --  23.6*  --   INR  --  2.1*  --   CREATININE 1.73*  --   --   TROPONINIHS 119*  --   --     Estimated Creatinine Clearance: 28.3 mL/min (A) (by C-G formula based on SCr of 1.73 mg/dL (H)).  Assessment: 61 yof with a history of CAD s/p PCI, GERD, Sarcoid on azathioprine, HLD, Obesity, DM. Patient is presenting with SOB and found to have extensive bilateral pulmonary embolism with +right heart strain. Patient arrested upon arrival to the MICU and received TNK 35mg  IV x1 on 9/18 @ 2315. Heparin was turned off at that time.  aPTT 30 min s/p TNK = 141 - still elevated from TNK admin Hgb 9.4, Plt 146  Goal of Therapy:  Heparin level 0.3-0.7 units/ml Monitor platelets by anticoagulation protocol: Yes   Plan:  Continue off heparin infusion in setting of elevated aPTT s/p TNK administration F/u aPTT at 0300 for potential restart.  Calton Dach, PharmD, BCCCP Clinical Pharmacist 08/04/2023 1:00 AM

## 2023-08-04 NOTE — Progress Notes (Signed)
ANTICOAGULATION CONSULT NOTE - Follow Up Consult  Pharmacy Consult for Heparin IV Indication: pulmonary embolus  Allergies  Allergen Reactions   Actos [Pioglitazone] Other (See Comments)    REACTION: pt states INTOL w/ edema   Codeine Other (See Comments)    REACTION: vomiting   Januvia [Sitagliptin] Nausea Only   Lactose Intolerance (Gi) Diarrhea   Morphine Nausea Only and Nausea And Vomiting    REACTION: vomiting REACTION: vomiting   Repatha [Evolocumab]     WAS NOT EFFECTIVE IN LOWERING LDL    Patient Measurements: Height: 5\' 2"  (157.5 cm) Weight: 68.9 kg (151 lb 14.4 oz) IBW/kg (Calculated) : 50.1 Heparin Dosing Weight: 64.5 kg  Vital Signs: Temp: 92.5 F (33.6 C) (09/19 0700) Temp Source: Rectal (09/19 0551) BP: 108/80 (09/19 0700) Pulse Rate: 76 (09/19 0700)  Labs: Recent Labs    08/03/23 1954 08/03/23 2359 08/04/23 0032 08/04/23 0249 08/04/23 0522 08/04/23 0843  HGB 11.7* 9.4*   < > 10.9* 9.9* 9.5*  HCT 35.9* 31.0*   < > 32.9* 29.0* 28.0*  PLT 220 146*  --  151  --   --   APTT  --  141*  --  103*  --   --   LABPROT  --  23.6*  --   --   --   --   INR  --  2.1*  --   --   --   --   CREATININE 1.73*  --   --  1.82*  --   --   TROPONINIHS 119* 122*  --   --   --   --    < > = values in this interval not displayed.    Estimated Creatinine Clearance: 26.9 mL/min (A) (by C-G formula based on SCr of 1.82 mg/dL (H)).   Medications:  Scheduled:   Chlorhexidine Gluconate Cloth  6 each Topical Daily   docusate  100 mg Per Tube BID   famotidine  20 mg Per Tube Daily   fentaNYL (SUBLIMAZE) injection  25 mcg Intravenous Once   hydrocortisone sod succinate (SOLU-CORTEF) inj  100 mg Intravenous Q12H   insulin aspart  0-15 Units Subcutaneous Q4H   polyethylene glycol  17 g Per Tube Daily   Infusions:   sodium chloride     fentaNYL infusion INTRAVENOUS 25 mcg/hr (08/04/23 0700)   norepinephrine (LEVOPHED) Adult infusion 20 mcg/min (08/04/23 0959)   sodium  bicarbonate 150 mEq in sterile water 1,150 mL infusion 150 mL/hr at 08/04/23 1159   vasopressin 0.03 Units/min (08/04/23 0949)    Assessment: 67 years of age female with saddle PE initially on IV Heparin then coded and received TNK at 2300 PM. Last aPTT 103-trending down. Labs have been delayed, aPTT now down to 35. CT Head - negative for bleed per Dr. Francine Graven, ok to start.   Goal of Therapy:  Heparin level 0.3 to 0.5 units/ml for 24 hrs then can target 0.3 to 0.7. Monitor platelets by anticoagulation protocol: Yes   Plan:  No heparin bolus due to recent TNK.  Start heparin infusion at 900 units/hr Check anti-Xa level in 6 hours and daily while on heparin Continue to monitor H&H and platelets  Link Snuffer, PharmD, BCPS, BCCCP Please refer to [facility] for [facility] Pharmacy numbers 08/04/2023,12:02 PM

## 2023-08-04 NOTE — Progress Notes (Signed)
Patient transported from 2M02 to CT and back to 2M02 with no complications.

## 2023-08-04 NOTE — Consult Note (Signed)
Chief Complaint: Acute pulmonary Embolism, High Risk/Massive, with Acute Cor Pulmonale and Shock  Referring Physician(s): Dr. Vassie Loll, Pulmonary and Critical Care  Supervising Physician: Gilmer Mor  Patient Status: [facility] - In-pt  History of Present Illness: Jocelyn [name] is a 67 y.o. female presenting as consultation to [facility] for consideration of salvage therapy for massive PE, SP peripheral IV   History for the EMR --> she presented to ED with shortness of breath for 1 week. She was evaluated by cardiology on 9/12 for vague symptoms of fatigue, myocardial perfusion study was performed 9/17 which was normal. She was hypertensive on arrival to the ED, hypothermic 95 7, toxic requiring 6 L nasal cannula Initial labs showed lactate of 7.6.  Code sepsis was initiated, COVID testing was negative CT angiogram chest was obtained which showed saddle pulm embolism as well as extensive bilateral emboli with RV/LV ratio 1.7 Soon after arrival to ICU, she developed PEA arrest with ROSC after 5 minutes of CPR, epi x 1, TNK given, intubated post arrest.    I met patient in 2M02, intubated.   She is currently on heparin ggt. Received TNK weight based dose.   Levophed was being titrated down to 47mcg/min.  Vitals at bedside. SBP: 120-130.  SBP dropped on RUE cuff to 100-105 after titrating down levophed.  HR: 97 O2: 100 with FiO2 of 60  Labs: WBC: 8.3 H/H: 9.4/31.0 Platelet: 146 Na: 139 K: 4.4 Lactic acid:  7.6 --> >9.0 Trop I: 119 --> 122  CTA:  CT 9:00pm 08/03/23 shows central/saddle embolus with extension into bilateral PA's, lobar, segmental/subsegmental of upper and lower lobs.  RV/LV is recorded 1.74  ECG reviewed 08/03/23 7:51pm.  Comparison 12/16/22 Sinus rhythm. Borderline prolonged PR New Q waves in III, inverted T waves in III and in precordial leads V3-V6.  ECHO is complete, discussed with Dr. Vassie Loll     Past Medical History:  Diagnosis Date   Acute bronchitis     Allergic rhinitis, cause unspecified    Anemia    Anxiety    B12 deficiency    BV (bacterial vaginosis) 06/22/1996   Calculus of kidney    Calculus of kidney    Coronary atherosclerosis of unspecified type of vessel, native or graft    Dizziness    Essential hypertension, benign    Fibroid 2003   Fibromyalgia    H/O dysmenorrhea 2008   H/O varicella    Headache(784.0)    frequently   HSV-2 infection 2009   Hyperplastic colon polyp 05/16/2014   Irritable bowel syndrome    Meniere's disease, unspecified    Menses, irregular 2003   Myalgia and myositis, unspecified    Myocardial infarction (HCC)    Obstructive sleep apnea (adult) (pediatric)    Perimenopausal symptoms 2003   Pure hypercholesterolemia    Sarcoidosis    Type II or unspecified type diabetes mellitus without mention of complication, not stated as uncontrolled    Unspecified venous (peripheral) insufficiency    Vitamin D deficiency    Vulvitis 2010   Yeast infection     Past Surgical History:  Procedure Laterality Date   COLONOSCOPY     CORONARY STENT INTERVENTION N/A 06/15/2017   Procedure: CORONARY STENT INTERVENTION;  Surgeon: Corky Crafts, MD;  Location: [facility] INVASIVE CV LAB;  Service: Cardiovascular;  Laterality: N/A;   heart catherization     LEFT HEART CATH AND CORONARY ANGIOGRAPHY N/A 06/15/2017   Procedure: LEFT HEART CATH AND CORONARY ANGIOGRAPHY;  Surgeon: Eldridge Dace,  Donnie Coffin, MD;  Location: [facility] INVASIVE CV LAB;  Service: Cardiovascular;  Laterality: N/A;   TONSILLECTOMY      Allergies: Actos [pioglitazone], Codeine, Januvia [sitagliptin], Lactose intolerance (gi), Morphine, and Repatha [evolocumab]  Medications: Prior to Admission medications   Medication Sig Start Date End Date Taking? Authorizing Provider  allopurinol (ZYLOPRIM) 100 MG tablet Take 1 tablet by mouth daily as needed. 09/25/21   [provider]  ALPRAZolam Prudy Feeler) 0.5 MG tablet Take 0.5 mg by mouth 3 (three) times daily.  07/15/23   [provider]  azaTHIOprine (IMURAN) 50 MG tablet Take 1 tablet by mouth daily.    [provider]  clopidogrel (PLAVIX) 75 MG tablet TAKE 1 TABLET BY MOUTH EVERY DAY 02/01/23   Corky Crafts, MD  diltiazem [facility] CD) 240 MG 24 hr capsule TAKE 1 CAPSULE(240 MG) BY MOUTH DAILY 04/04/23   Corky Crafts, MD  Empagliflozin-metFORMIN HCl ER 25-1000 MG TB24 Take 1 tablet by mouth daily.    [provider]  GEMTESA 75 MG TABS Take 1 tablet by mouth daily. 12/31/21   [provider]  icosapent Ethyl (VASCEPA) 1 g capsule TAKE 2 CAPSULES(2 GRAMS) BY MOUTH TWICE DAILY 02/03/23   Corky Crafts, MD  losartan (COZAAR) 100 MG tablet TAKE 1 TABLET(100 MG) BY MOUTH DAILY 03/03/23   Corky Crafts, MD  methylPREDNISolone (MEDROL) 4 MG tablet Take 4 mg by mouth daily. 07/08/23   [provider]  metoprolol tartrate (LOPRESSOR) 50 MG tablet TAKE 1 TABLET(50 MG) BY MOUTH TWICE DAILY 04/04/23   Corky Crafts, MD  NEXLETOL 180 MG TABS TAKE 1 TABLET BY MOUTH DAILY 07/20/23   Corky Crafts, MD  nitroGLYCERIN (NITROSTAT) 0.4 MG SL tablet Place 1 tablet (0.4 mg total) under the tongue every 5 (five) minutes as needed for chest pain. 02/08/22   Corky Crafts, MD  [facility] VERIO test strip CHECK BLOOD SUGAR TID AS DIRECTED 10/04/19   [provider]  PRALUENT 150 MG/ML SOAJ INJECT 150 MG INTO THE SKIN EVERY 14 DAYS 08/06/22   Corky Crafts, MD  spironolactone (ALDACTONE) 25 MG tablet TAKE 1 TABLET BY MOUTH EVERY DAY 02/01/23   Corky Crafts, MD     Family History  Adopted: Yes  Problem Relation Age of Onset   Other Other        ADOPTED   Colon cancer Neg Hx    Esophageal cancer Neg Hx    Rectal cancer Neg Hx    Stomach cancer Neg Hx     Social History   Socioeconomic History   Marital status: Single    Spouse name: Not on file   Number of children: 1   Years of education: Not on file    Highest education level: Not on file  Occupational History   Occupation: Retired Runner, broadcasting/film/video   Tobacco Use   Smoking status: Never   Smokeless tobacco: Never   Tobacco comments:    Daily Caffeine - 1  Exercise 2-3 times/weekly  Vaping Use   Vaping status: Never Used  Substance and Sexual Activity   Alcohol use: No    Alcohol/week: 0.0 standard drinks of alcohol   Drug use: No   Sexual activity: Yes    Birth control/protection: None  Other Topics Concern   Not on file  Social History Narrative   Exercises 2-3 times weekly   Caffeine Use: 1 daily (tea or Pepsi)   Lives alone in a one story home.  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     Social Determinants of Health   Financial Resource Strain: Not on file  Food Insecurity: Not on file  Transportation Needs: Not on file  Physical Activity: Not on file  Stress: Not on file  Social Connections: Not on file       Review of Systems: A 12 point ROS discussed and pertinent positives are indicated in the HPI above.  All other systems are negative.  Review of Systems  Vital Signs: BP 108/80   Pulse (!) 112   Temp (!) 95.3 F (35.2 C) (Temporal)   Resp 19   Ht 5\' 2"  (1.575 m)   Wt 68.9 kg   SpO2 99%   BMI 27.78 kg/m   Advance Care Plan: The advanced care plan/surrogate decision maker was discussed at the time of visit and documented in the medical record.    Physical Exam  Imaging: DG Chest Port 1 View  Result Date: 08/04/2023 CLINICAL DATA:  Status post intubation, known PE bilaterally. EXAM: PORTABLE CHEST 1 VIEW COMPARISON:  01/13/2021 FINDINGS: Cardiac shadow is prominent but accentuated by the frontal technique. Endotracheal tube and gastric catheter are noted in satisfactory position. No focal infiltrate is seen. No bony abnormality is noted. IMPRESSION: Tubes and lines in satisfactory position. No acute abnormality noted. Electronically Signed   By: Alcide Clever M.D.   On: 08/04/2023 00:01   CT Angio Chest  Pulmonary Embolism (PE) W or WO Contrast  Result Date: 08/03/2023 CLINICAL DATA:  Shortness of breath EXAM: CT ANGIOGRAPHY CHEST WITH CONTRAST TECHNIQUE: Multidetector CT imaging of the chest was performed using the standard protocol during bolus administration of intravenous contrast. Multiplanar CT image reconstructions and MIPs were obtained to evaluate the vascular anatomy. RADIATION DOSE REDUCTION: This exam was performed according to the departmental dose-optimization program which includes automated exposure control, adjustment of the mA and/or kV according to patient size and/or use of iterative reconstruction technique. CONTRAST:  80mL OMNIPAQUE IOHEXOL 350 MG/ML SOLN COMPARISON:  01/02/2019 FINDINGS: Cardiovascular: Thoracic aorta demonstrates atherosclerotic calcification. No aneurysmal dilatation is noted. Pulmonary artery shows a normal branching pattern bilaterally. Extensive bilateral pulmonary emboli are noted in the lobar branches as well as a large saddle component across the bifurcation. RV LV ratio is measured at 1.74 consistent with significant right heart strain. Coronary calcifications are noted. No cardiac enlargement is seen. 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 Mediastinum/Nodes: Thoracic inlet is within normal limits. No hilar or mediastinal adenopathy is noted. Esophagus as visualized is within normal limits. Lungs/Pleura: Lungs are well aerated bilaterally. No focal infiltrate or sizable effusion is noted. Upper Abdomen: Visualized upper abdomen shows no acute abnormality. Musculoskeletal: No acute bony abnormality is noted. Review of the MIP images confirms the above findings. IMPRESSION: Extensive bilateral pulmonary emboli as described with evidence of right heart strain within RV/LV ratio of 1.74. No other focal abnormality is noted. Aortic Atherosclerosis (ICD10-I70.0). Critical Value/emergent results were called by telephone at the time of interpretation on 08/03/2023  at 9:43 pm to Dr. Glyn Ade , who verbally acknowledged these results. Electronically Signed   By: Alcide Clever M.D.   On: 08/03/2023 21:46   MYOCARDIAL PERFUSION IMAGING  Result Date: 08/02/2023   LV perfusion is normal. There is no evidence of ischemia. There is no evidence of infarction.   Left ventricular function is normal. Nuclear stress EF: 69%. The left ventricular ejection fraction is hyperdynamic (>65%). End diastolic cavity size is normal.  The study is normal. The study is low risk.    Labs:  CBC: Recent Labs    08/03/23 1954 08/03/23 2359 08/04/23 0032  WBC 6.7 8.3  --   HGB 11.7* 9.4* 9.2*  HCT 35.9* 31.0* 27.0*  PLT 220 146*  --     COAGS: Recent Labs    08/03/23 2359  INR 2.1*  APTT 141*    BMP: Recent Labs    08/03/23 1954 08/04/23 0032  NA 140 139  K 5.2* 4.4  CL 105  --   CO2 12*  --   GLUCOSE 101*  --   BUN 34*  --   CALCIUM 9.6  --   CREATININE 1.73*  --   GFRNONAA 32*  --     LIVER FUNCTION TESTS: No results for input(s): "BILITOT", "AST", "ALT", "ALKPHOS", "PROT", "ALBUMIN" in the last 8760 hours.  TUMOR MARKERS: No results for input(s): "AFPTM", "CEA", "CA199", "CHROMGRNA" in the last 8760 hours.  Assessment and Plan:  Ms Dosch is 67 year old female presenting with high risk/massive pulmonary embolism/VTE, SP front-line treatment with peripheral IV dose of weight-based TNK, with ongoing cor pulmonale/obstructive shock.   I had a lengthy discussion with the patients family, including her daughter [name], regarding the diagnosis of VTE/PE, the standard therapies such as blood thinners, and then further therapies based on risk-stratification.  We discussed the fact that her family falls into the category of high-risk PE/Massive PE, and she has received front line therapy with peripheral IV dose of weight based thrombolytics (TNK).  She remains with obstructive shock, tenuous blood pressure, on pressors.   Further treatment options  as salvage therapy were discussed, specifically catheter directed therapies.  These are performed adjunctively to pulmonary angio, with possible mechanical/aspiration thrombectomy, possible catheter directed lysis.  Regarding risk of the procedure, specifically we discussed bleeding including possible hemoptysis, infection, injury to local structures, need for further procedure/surgery, cardiac arrhythmia, cardiac and/or pulmonary injury, cardiopulmonary collapse, need for further CPR, stroke, death.   They are requesting that she is a full code, and would like everything done to help.   After our discussion, we will plan to proceed with catheter directed therapy as rescue therapy for massive/high risk pulmonary embolism as above.   Consent signed and in chart.     Thank you for this interesting consult.  I greatly enjoyed meeting [employer] and look forward to participating in their care.  A copy of this report was sent to the requesting provider on this date.  Electronically Signed: Gilmer Mor, DO 08/04/2023, 1:27 AM   I spent a total of 80 Minutes    in face to face in clinical consultation, greater than 50% of which was counseling/coordinating care for catheter directed therapy for massive/high risk PE, on going obstructive shock.

## 2023-08-04 NOTE — Inpatient Diabetes Management (Addendum)
Inpatient Diabetes Program Recommendations  AACE/ADA: New Consensus Statement on Inpatient Glycemic Control (2015)  Target Ranges:  Prepandial:   less than 140 mg/dL      Peak postprandial:   less than 180 mg/dL (1-2 hours)      Critically ill patients:  140 - 180 mg/dL   Lab Results  Component Value Date   GLUCAP 241 (H) 08/04/2023   HGBA1C 6.2 (H) 08/03/2023    Review of Glycemic Control  Latest Reference Range & Units 08/03/23 22:57 08/03/23 23:49 08/04/23 01:10 08/04/23 06:13 08/04/23 07:51  Glucose-Capillary 70 - 99 mg/dL 53 (L) 295 (H) 284 (H) 225 (H) 241 (H)   Diabetes history: DM 2 Outpatient Diabetes medications:  empagliflozin-metformin 25-1000 1 tablet daily Current orders for Inpatient glycemic control:  Novolog 0-15 units Q4 hours  Note: Glucose trends in the 200 range.  Solucortef 100 mg Q12 hours  Inpatient Diabetes Program Recommendations:    -   Add Semglee 8 units  Thanks,  Christena Deem RN, MSN, BC-ADM Inpatient Diabetes Coordinator Team Pager [phone] (8a-5p)

## 2023-08-04 NOTE — Progress Notes (Signed)
Echocardiogram 2D Echocardiogram has been performed.  Jocelyn [name] 08/04/2023, 2:28 PM

## 2023-08-04 NOTE — Progress Notes (Signed)
Referring Physician(s): Dr. Roderic Palau   Supervising Physician: Irish Lack  Patient Status:  [facility] - In-pt  Chief Complaint:  Pulmonary Embolism  Subjective:  9?19 Pulmonary angiogram and pressure Mechanical/Aspiration thrombectomy of right and left main and lobar pulmonary arteries.  Pt on ventilator and sedated  Allergies: Actos [pioglitazone], Codeine, Januvia [sitagliptin], Lactose intolerance (gi), Morphine, and Repatha [evolocumab]  Medications: Prior to Admission medications   Medication Sig Start Date End Date Taking? Authorizing Provider  azaTHIOprine (IMURAN) 50 MG tablet Take 1 tablet by mouth daily.   Yes [provider]  clopidogrel (PLAVIX) 75 MG tablet TAKE 1 TABLET BY MOUTH EVERY DAY 02/01/23  Yes Corky Crafts, MD  Empagliflozin-metFORMIN HCl ER 25-1000 MG TB24 Take 1 tablet by mouth daily.   Yes [provider]  losartan (COZAAR) 100 MG tablet TAKE 1 TABLET(100 MG) BY MOUTH DAILY 03/03/23  Yes Corky Crafts, MD  NEXLETOL 180 MG TABS TAKE 1 TABLET BY MOUTH DAILY 07/20/23  Yes Corky Crafts, MD  nitroGLYCERIN (NITROSTAT) 0.4 MG SL tablet Place 1 tablet (0.4 mg total) under the tongue every 5 (five) minutes as needed for chest pain. 02/08/22  Yes Corky Crafts, MD  spironolactone (ALDACTONE) 25 MG tablet TAKE 1 TABLET BY MOUTH EVERY DAY 02/01/23  Yes Corky Crafts, MD  allopurinol (ZYLOPRIM) 100 MG tablet Take 1 tablet by mouth daily as needed. 09/25/21   [provider]  ALPRAZolam Prudy Feeler) 0.5 MG tablet Take 0.5 mg by mouth 3 (three) times daily. 07/15/23   [provider]  diltiazem (CARDIZEM CD) 240 MG 24 hr capsule TAKE 1 CAPSULE(240 MG) BY MOUTH DAILY 04/04/23   Corky Crafts, MD  GEMTESA 75 MG TABS Take 1 tablet by mouth daily. 12/31/21   [provider]  icosapent Ethyl (VASCEPA) 1 g capsule TAKE 2 CAPSULES(2 GRAMS) BY MOUTH TWICE DAILY 02/03/23   Corky Crafts, MD   methylPREDNISolone (MEDROL) 4 MG tablet Take 4 mg by mouth daily. 07/08/23   [provider]  metoprolol tartrate (LOPRESSOR) 50 MG tablet TAKE 1 TABLET(50 MG) BY MOUTH TWICE DAILY 04/04/23   Corky Crafts, MD  [facility] VERIO test strip CHECK BLOOD SUGAR TID AS DIRECTED 10/04/19   [provider]  PRALUENT 150 MG/ML SOAJ INJECT 150 MG INTO THE SKIN EVERY 14 DAYS 08/06/22   Corky Crafts, MD     Vital Signs: BP 108/80   Pulse 76   Temp (!) 92.5 F (33.6 C)   Resp (!) 30   Ht 5\' 2"  (1.575 m)   Wt 151 lb 14.4 oz (68.9 kg)   SpO2 100%   BMI 27.78 kg/m   Physical Exam Constitutional:      Interventions: She is intubated.  Pulmonary:     Effort: She is intubated.     Breath sounds: Normal breath sounds. No wheezing.  Musculoskeletal:     Comments: Right groin site negative for bleeding or hematoma Absent for signs of infection Able to hear right post tibial pulse with use of doppler Right leg warm to touch  Skin:    General: Skin is warm and dry.     Imaging: IR THROMBECT PRIM MECH INIT (INCLU) MOD SED  Result Date: 08/04/2023 INDICATION: 67 year old female presents with massive/high risk pulmonary embolism, cardiopulmonary resuscitation required after systemic/IV thrombolysis dose. Persisting obstructive shock. She has been referred for catheter directed therapy as rescue therapy. EXAM: ULTRASOUND-GUIDED ACCESS RIGHT COMMON FEMORAL VEIN PULMONARY ANGIOGRAM MECHANICAL  THROMBECTOMY/ASPIRATION THROMBECTOMY OF BILATERAL PULMONARY ARTERIES COMPARISON:  CT 08/03/2023 MEDICATIONS: None. ANESTHESIA/SEDATION: The the patient was intubated in the ICU before presentation to Interventional Radiology, with general endotracheal tube anesthesia during the procedure. Fentanyl drip. Intubation was performed in ICU. Interventional radiology, and the ICU nursing staff were present throughout the procedure for additional monitoring and care. 75 minutes of case time, under  fentanyl drip sedation. FLUOROSCOPY: Radiation Exposure Index (as provided by the fluoroscopic device): 467 mGy Kerma COMPLICATIONS: None TECHNIQUE: Informed consent was obtained from the patient's family following explanation of the procedure, risks, benefits and alternatives. Specific risks include bleeding, infection, contrast reaction, kidney injury, venous injury, life-threatening hemorrhage including brain hemorrhage, gastrointestinal hemorrhage, epistaxis, need for further surgery, need for further procedure, cardiac arrhythmia, cardiopulmonary collapse, death. The patient understands, agrees and consents for the procedure. All questions were addressed. Patient is position supine position on the fluoroscopy table. Maximal barrier sterile technique utilized including caps, mask, sterile gowns, sterile gloves, large sterile drape, hand hygiene, and chlorhexidine prep. 1% lidocaine used for local anesthesia. Moderate sedation was provided. Ultrasound survey of the right inguinal region was performed with images stored and sent to PACs, confirming patency of the right common femoral vein. A micro puncture kit was used access the right common femoral vein under ultrasound. With excellent blood flow returned, Mandril wire was passed through the needle, entering the iliac vein. Eleven blade was used to make an incision at the needle and the needle was removed from the wire. The 4 French sheath was advanced on the wire. The wire the inner introducer were removed and 035 Bentson wire was passed through the 4 French sheath into the IVC under fluoroscopy. Standard 6 French vascular sheath was placed. The dilator was removed and the sheath was flushed. Combination of a angled pigtail catheter and a Bentson wire was then used to navigate to the right heart. Angled catheter used to select the main pulmonary artery. Wire was removed and angiogram was performed. Pressure measurement was obtained. Pulmonary angiogram was  performed. After review, there were filling defects within both of the right and left pulmonary arteries extending into the upper lobar segments. We elected to proceed with mechanical thrombectomy. Roadrunner wire was then passed through the pigtail catheter, positioned into the right pulmonary artery branches. The pigtail catheter was removed. The 6 French sheath was removed and a sixteen [place] Gore dry seal sheath was then passed over the wire into the IVC. Dilator was removed and the dry seal hub was inflated for hemostasis. The lightening 16 aspiration thrombectomy catheter was then passed on the wire. During the initial positioning of the aspiration catheter, the wire withdrew into the main pulmonary artery. Wire was then repositioned into the left-sided arteries, which we addressed first. Aspiration thrombectomy was performed with the 16 [place] device. After the initial aspiration thrombectomy, the aspiration catheter was plugged. Catheter needed removal. After clearing of the catheter outside of the patient, the pigtail catheter was advanced on the wire into the main pulmonary artery. Repeat angiogram was performed, as well as repeat pulmonary artery pressure measurement recorded. We then proceeded with treatment of the right-sided pulmonary arteries. The catheter was positioned with a rose in wire into the right-sided pulmonary arteries. The lightening 16 device was advanced on the Rose an wire into the right-sided pulmonary arteries. Aspiration thrombectomy was performed targeting the filling defects of the right pulmonary artery extending to mainly into the upper lobe branches. After the initial passive aspiration thrombectomy, the catheter  was plugged. Catheter was removed from the sheath and the catheter was cleared. The pigtail catheter was then readvanced on the Rose an wire. A repeat angiogram was performed as well as pulmonary artery pressures. After review of the images, and review of the patient's  current vital signs after treatment of bilateral pulmonary arteries, we elected to withdraw. All catheters and wires were removed. The sheath was removed with suture placed at the access site. Manual pressure was used for 20 minutes. Pressure bandage was applied. The patient tolerated the procedure well and remained hemodynamically stable throughout. No complications were encountered.  220 cc estimated blood loss. FINDINGS: Ultrasound demonstrates patent right common femoral vein Baseline pulmonary artery angiogram demonstrates filling defects within the bilateral left and right pulmonary arteries extending into predominantly upper lobe branches in the segmental and subsegmental distribution. Initial PA pressure: 43/9 (26)PA pressure after treating 1st side/left: 47/10 (27)PA pressure after treating 2nd side/right: 49/17 (32) Final angiogram images after aspiration thrombectomy demonstrates resolution of the filling defects extending into the upper lobar segments, with no large residual filling defects within the main pulmonary artery, bilateral left/right pulmonary arteries, or the lobar or upper lobe proximal segmental branches. Likely some residual segmental/subsegmental branches affected in the left lower lobe. IMPRESSION: Status post ultrasound guided access right common femoral vein for pulmonary angiogram/pressure measurements and aspiration thrombectomy of bilateral pulmonary arteries for treatment of high-risk/massive pulmonary embolism. Signed, Yvone Neu. Miachel Roux, RPVI [facility] Radiology Electronically Signed   By: Gilmer Mor D.O.   On: 08/04/2023 08:38   IR US Guide Vasc Access Right  Result Date: 08/04/2023 INDICATION: 67 year old female presents with massive/high risk pulmonary embolism, cardiopulmonary resuscitation required after systemic/IV thrombolysis dose. Persisting obstructive shock. She has been referred for catheter directed  therapy as rescue therapy. EXAM: ULTRASOUND-GUIDED ACCESS RIGHT COMMON FEMORAL VEIN PULMONARY ANGIOGRAM MECHANICAL THROMBECTOMY/ASPIRATION THROMBECTOMY OF BILATERAL PULMONARY ARTERIES COMPARISON:  CT 08/03/2023 MEDICATIONS: None. ANESTHESIA/SEDATION: The the patient was intubated in the ICU before presentation to Interventional Radiology, with general endotracheal tube anesthesia during the procedure. Fentanyl drip. Intubation was performed in ICU. Interventional radiology, and the ICU nursing staff were present throughout the procedure for additional monitoring and care. 75 minutes of case time, under fentanyl drip sedation. FLUOROSCOPY: Radiation Exposure Index (as provided by the fluoroscopic device): 467 mGy Kerma COMPLICATIONS: None TECHNIQUE: Informed consent was obtained from the patient's family following explanation of the procedure, risks, benefits and alternatives. Specific risks include bleeding, infection, contrast reaction, kidney injury, venous injury, life-threatening hemorrhage including brain hemorrhage, gastrointestinal hemorrhage, epistaxis, need for further surgery, need for further procedure, cardiac arrhythmia, cardiopulmonary collapse, death. The patient understands, agrees and consents for the procedure. All questions were addressed. Patient is position supine position on the fluoroscopy table. Maximal barrier sterile technique utilized including caps, mask, sterile gowns, sterile gloves, large sterile drape, hand hygiene, and chlorhexidine prep. 1% lidocaine used for local anesthesia. Moderate sedation was provided. Ultrasound survey of the right inguinal region was performed with images stored and sent to PACs, confirming patency of the right common femoral vein. A micro puncture kit was used access the right common femoral vein under ultrasound. With excellent blood flow returned, Mandril wire was passed through the needle, entering the iliac vein. Eleven blade was used to make an  incision at the needle and the needle was removed from the wire. The 4 French sheath was advanced on the wire. The wire the inner introducer were removed and 035 Bentson  wire was passed through the 4 French sheath into the IVC under fluoroscopy. Standard 6 French vascular sheath was placed. The dilator was removed and the sheath was flushed. Combination of a angled pigtail catheter and a Bentson wire was then used to navigate to the right heart. Angled catheter used to select the main pulmonary artery. Wire was removed and angiogram was performed. Pressure measurement was obtained. Pulmonary angiogram was performed. After review, there were filling defects within both of the right and left pulmonary arteries extending into the upper lobar segments. We elected to proceed with mechanical thrombectomy. Roadrunner wire was then passed through the pigtail catheter, positioned into the right pulmonary artery branches. The pigtail catheter was removed. The 6 French sheath was removed and a sixteen [place] Gore dry seal sheath was then passed over the wire into the IVC. Dilator was removed and the dry seal hub was inflated for hemostasis. The lightening 16 aspiration thrombectomy catheter was then passed on the wire. During the initial positioning of the aspiration catheter, the wire withdrew into the main pulmonary artery. Wire was then repositioned into the left-sided arteries, which we addressed first. Aspiration thrombectomy was performed with the 16 [place] device. After the initial aspiration thrombectomy, the aspiration catheter was plugged. Catheter needed removal. After clearing of the catheter outside of the patient, the pigtail catheter was advanced on the wire into the main pulmonary artery. Repeat angiogram was performed, as well as repeat pulmonary artery pressure measurement recorded. We then proceeded with treatment of the right-sided pulmonary arteries. The catheter was positioned with a rose in wire into the  right-sided pulmonary arteries. The lightening 16 device was advanced on the Rose an wire into the right-sided pulmonary arteries. Aspiration thrombectomy was performed targeting the filling defects of the right pulmonary artery extending to mainly into the upper lobe branches. After the initial passive aspiration thrombectomy, the catheter was plugged. Catheter was removed from the sheath and the catheter was cleared. The pigtail catheter was then readvanced on the Rose an wire. A repeat angiogram was performed as well as pulmonary artery pressures. After review of the images, and review of the patient's current vital signs after treatment of bilateral pulmonary arteries, we elected to withdraw. All catheters and wires were removed. The sheath was removed with suture placed at the access site. Manual pressure was used for 20 minutes. Pressure bandage was applied. The patient tolerated the procedure well and remained hemodynamically stable throughout. No complications were encountered.  220 cc estimated blood loss. FINDINGS: Ultrasound demonstrates patent right common femoral vein Baseline pulmonary artery angiogram demonstrates filling defects within the bilateral left and right pulmonary arteries extending into predominantly upper lobe branches in the segmental and subsegmental distribution. Initial PA pressure: 43/9 (26)PA pressure after treating 1st side/left: 47/10 (27)PA pressure after treating 2nd side/right: 49/17 (32) Final angiogram images after aspiration thrombectomy demonstrates resolution of the filling defects extending into the upper lobar segments, with no large residual filling defects within the main pulmonary artery, bilateral left/right pulmonary arteries, or the lobar or upper lobe proximal segmental branches. Likely some residual segmental/subsegmental branches affected in the left lower lobe. IMPRESSION: Status post ultrasound guided access right common femoral vein for pulmonary  angiogram/pressure measurements and aspiration thrombectomy of bilateral pulmonary arteries for treatment of high-risk/massive pulmonary embolism. Signed, Yvone Neu. Miachel Roux, RPVI [facility] Radiology Electronically Signed   By: Gilmer Mor D.O.   On: 08/04/2023 08:38   IR Angiogram Pulmonary Bilateral Selective  Result Date: 08/04/2023 INDICATION: 67 year old female presents with massive/high risk pulmonary embolism, cardiopulmonary resuscitation required after systemic/IV thrombolysis dose. Persisting obstructive shock. She has been referred for catheter directed therapy as rescue therapy. EXAM: ULTRASOUND-GUIDED ACCESS RIGHT COMMON FEMORAL VEIN PULMONARY ANGIOGRAM MECHANICAL THROMBECTOMY/ASPIRATION THROMBECTOMY OF BILATERAL PULMONARY ARTERIES COMPARISON:  CT 08/03/2023 MEDICATIONS: None. ANESTHESIA/SEDATION: The the patient was intubated in the ICU before presentation to Interventional Radiology, with general endotracheal tube anesthesia during the procedure. Fentanyl drip. Intubation was performed in ICU. Interventional radiology, and the ICU nursing staff were present throughout the procedure for additional monitoring and care. 75 minutes of case time, under fentanyl drip sedation. FLUOROSCOPY: Radiation Exposure Index (as provided by the fluoroscopic device): 467 mGy Kerma COMPLICATIONS: None TECHNIQUE: Informed consent was obtained from the patient's family following explanation of the procedure, risks, benefits and alternatives. Specific risks include bleeding, infection, contrast reaction, kidney injury, venous injury, life-threatening hemorrhage including brain hemorrhage, gastrointestinal hemorrhage, epistaxis, need for further surgery, need for further procedure, cardiac arrhythmia, cardiopulmonary collapse, death. The patient understands, agrees and consents for the procedure. All questions were addressed. Patient is position supine position  on the fluoroscopy table. Maximal barrier sterile technique utilized including caps, mask, sterile gowns, sterile gloves, large sterile drape, hand hygiene, and chlorhexidine prep. 1% lidocaine used for local anesthesia. Moderate sedation was provided. Ultrasound survey of the right inguinal region was performed with images stored and sent to PACs, confirming patency of the right common femoral vein. A micro puncture kit was used access the right common femoral vein under ultrasound. With excellent blood flow returned, Mandril wire was passed through the needle, entering the iliac vein. Eleven blade was used to make an incision at the needle and the needle was removed from the wire. The 4 French sheath was advanced on the wire. The wire the inner introducer were removed and 035 Bentson wire was passed through the 4 French sheath into the IVC under fluoroscopy. Standard 6 French vascular sheath was placed. The dilator was removed and the sheath was flushed. Combination of a angled pigtail catheter and a Bentson wire was then used to navigate to the right heart. Angled catheter used to select the main pulmonary artery. Wire was removed and angiogram was performed. Pressure measurement was obtained. Pulmonary angiogram was performed. After review, there were filling defects within both of the right and left pulmonary arteries extending into the upper lobar segments. We elected to proceed with mechanical thrombectomy. Roadrunner wire was then passed through the pigtail catheter, positioned into the right pulmonary artery branches. The pigtail catheter was removed. The 6 French sheath was removed and a sixteen [place] Gore dry seal sheath was then passed over the wire into the IVC. Dilator was removed and the dry seal hub was inflated for hemostasis. The lightening 16 aspiration thrombectomy catheter was then passed on the wire. During the initial positioning of the aspiration catheter, the wire withdrew into the main  pulmonary artery. Wire was then repositioned into the left-sided arteries, which we addressed first. Aspiration thrombectomy was performed with the 16 [place] device. After the initial aspiration thrombectomy, the aspiration catheter was plugged. Catheter needed removal. After clearing of the catheter outside of the patient, the pigtail catheter was advanced on the wire into the main pulmonary artery. Repeat angiogram was performed, as well as repeat pulmonary artery pressure measurement recorded. We then proceeded with treatment of the right-sided pulmonary arteries. The catheter was positioned with a rose in wire into the right-sided pulmonary arteries. The lightening  16 device was advanced on the Rose an wire into the right-sided pulmonary arteries. Aspiration thrombectomy was performed targeting the filling defects of the right pulmonary artery extending to mainly into the upper lobe branches. After the initial passive aspiration thrombectomy, the catheter was plugged. Catheter was removed from the sheath and the catheter was cleared. The pigtail catheter was then readvanced on the Rose an wire. A repeat angiogram was performed as well as pulmonary artery pressures. After review of the images, and review of the patient's current vital signs after treatment of bilateral pulmonary arteries, we elected to withdraw. All catheters and wires were removed. The sheath was removed with suture placed at the access site. Manual pressure was used for 20 minutes. Pressure bandage was applied. The patient tolerated the procedure well and remained hemodynamically stable throughout. No complications were encountered.  220 cc estimated blood loss. FINDINGS: Ultrasound demonstrates patent right common femoral vein Baseline pulmonary artery angiogram demonstrates filling defects within the bilateral left and right pulmonary arteries extending into predominantly upper lobe branches in the segmental and subsegmental distribution.  Initial PA pressure: 43/9 (26)PA pressure after treating 1st side/left: 47/10 (27)PA pressure after treating 2nd side/right: 49/17 (32) Final angiogram images after aspiration thrombectomy demonstrates resolution of the filling defects extending into the upper lobar segments, with no large residual filling defects within the main pulmonary artery, bilateral left/right pulmonary arteries, or the lobar or upper lobe proximal segmental branches. Likely some residual segmental/subsegmental branches affected in the left lower lobe. IMPRESSION: Status post ultrasound guided access right common femoral vein for pulmonary angiogram/pressure measurements and aspiration thrombectomy of bilateral pulmonary arteries for treatment of high-risk/massive pulmonary embolism. Signed, Yvone Neu. Miachel Roux, RPVI [facility] Radiology Electronically Signed   By: Gilmer Mor D.O.   On: 08/04/2023 08:38   IR Angiogram Selective Each Additional Vessel  Result Date: 08/04/2023 INDICATION: 67 year old female presents with massive/high risk pulmonary embolism, cardiopulmonary resuscitation required after systemic/IV thrombolysis dose. Persisting obstructive shock. She has been referred for catheter directed therapy as rescue therapy. EXAM: ULTRASOUND-GUIDED ACCESS RIGHT COMMON FEMORAL VEIN PULMONARY ANGIOGRAM MECHANICAL THROMBECTOMY/ASPIRATION THROMBECTOMY OF BILATERAL PULMONARY ARTERIES COMPARISON:  CT 08/03/2023 MEDICATIONS: None. ANESTHESIA/SEDATION: The the patient was intubated in the ICU before presentation to Interventional Radiology, with general endotracheal tube anesthesia during the procedure. Fentanyl drip. Intubation was performed in ICU. Interventional radiology, and the ICU nursing staff were present throughout the procedure for additional monitoring and care. 75 minutes of case time, under fentanyl drip sedation. FLUOROSCOPY: Radiation Exposure Index (as provided by  the fluoroscopic device): 467 mGy Kerma COMPLICATIONS: None TECHNIQUE: Informed consent was obtained from the patient's family following explanation of the procedure, risks, benefits and alternatives. Specific risks include bleeding, infection, contrast reaction, kidney injury, venous injury, life-threatening hemorrhage including brain hemorrhage, gastrointestinal hemorrhage, epistaxis, need for further surgery, need for further procedure, cardiac arrhythmia, cardiopulmonary collapse, death. The patient understands, agrees and consents for the procedure. All questions were addressed. Patient is position supine position on the fluoroscopy table. Maximal barrier sterile technique utilized including caps, mask, sterile gowns, sterile gloves, large sterile drape, hand hygiene, and chlorhexidine prep. 1% lidocaine used for local anesthesia. Moderate sedation was provided. Ultrasound survey of the right inguinal region was performed with images stored and sent to PACs, confirming patency of the right common femoral vein. A micro puncture kit was used access the right common femoral vein under ultrasound. With excellent blood flow returned, Mandril wire was passed through the needle,  entering the iliac vein. Eleven blade was used to make an incision at the needle and the needle was removed from the wire. The 4 French sheath was advanced on the wire. The wire the inner introducer were removed and 035 Bentson wire was passed through the 4 French sheath into the IVC under fluoroscopy. Standard 6 French vascular sheath was placed. The dilator was removed and the sheath was flushed. Combination of a angled pigtail catheter and a Bentson wire was then used to navigate to the right heart. Angled catheter used to select the main pulmonary artery. Wire was removed and angiogram was performed. Pressure measurement was obtained. Pulmonary angiogram was performed. After review, there were filling defects within both of the right and  left pulmonary arteries extending into the upper lobar segments. We elected to proceed with mechanical thrombectomy. Roadrunner wire was then passed through the pigtail catheter, positioned into the right pulmonary artery branches. The pigtail catheter was removed. The 6 French sheath was removed and a sixteen [place] Gore dry seal sheath was then passed over the wire into the IVC. Dilator was removed and the dry seal hub was inflated for hemostasis. The lightening 16 aspiration thrombectomy catheter was then passed on the wire. During the initial positioning of the aspiration catheter, the wire withdrew into the main pulmonary artery. Wire was then repositioned into the left-sided arteries, which we addressed first. Aspiration thrombectomy was performed with the 16 [place] device. After the initial aspiration thrombectomy, the aspiration catheter was plugged. Catheter needed removal. After clearing of the catheter outside of the patient, the pigtail catheter was advanced on the wire into the main pulmonary artery. Repeat angiogram was performed, as well as repeat pulmonary artery pressure measurement recorded. We then proceeded with treatment of the right-sided pulmonary arteries. The catheter was positioned with a rose in wire into the right-sided pulmonary arteries. The lightening 16 device was advanced on the Rose an wire into the right-sided pulmonary arteries. Aspiration thrombectomy was performed targeting the filling defects of the right pulmonary artery extending to mainly into the upper lobe branches. After the initial passive aspiration thrombectomy, the catheter was plugged. Catheter was removed from the sheath and the catheter was cleared. The pigtail catheter was then readvanced on the Rose an wire. A repeat angiogram was performed as well as pulmonary artery pressures. After review of the images, and review of the patient's current vital signs after treatment of bilateral pulmonary arteries, we elected  to withdraw. All catheters and wires were removed. The sheath was removed with suture placed at the access site. Manual pressure was used for 20 minutes. Pressure bandage was applied. The patient tolerated the procedure well and remained hemodynamically stable throughout. No complications were encountered.  220 cc estimated blood loss. FINDINGS: Ultrasound demonstrates patent right common femoral vein Baseline pulmonary artery angiogram demonstrates filling defects within the bilateral left and right pulmonary arteries extending into predominantly upper lobe branches in the segmental and subsegmental distribution. Initial PA pressure: 43/9 (26)PA pressure after treating 1st side/left: 47/10 (27)PA pressure after treating 2nd side/right: 49/17 (32) Final angiogram images after aspiration thrombectomy demonstrates resolution of the filling defects extending into the upper lobar segments, with no large residual filling defects within the main pulmonary artery, bilateral left/right pulmonary arteries, or the lobar or upper lobe proximal segmental branches. Likely some residual segmental/subsegmental branches affected in the left lower lobe. IMPRESSION: Status post ultrasound guided access right common femoral vein for pulmonary angiogram/pressure measurements and aspiration thrombectomy of bilateral pulmonary  arteries for treatment of high-risk/massive pulmonary embolism. Signed, Yvone Neu. Miachel Roux, RPVI [facility] Radiology Electronically Signed   By: Gilmer Mor D.O.   On: 08/04/2023 08:38   IR Angiogram Selective Each Additional Vessel  Result Date: 08/04/2023 INDICATION: 67 year old female presents with massive/high risk pulmonary embolism, cardiopulmonary resuscitation required after systemic/IV thrombolysis dose. Persisting obstructive shock. She has been referred for catheter directed therapy as rescue therapy. EXAM: ULTRASOUND-GUIDED ACCESS RIGHT  COMMON FEMORAL VEIN PULMONARY ANGIOGRAM MECHANICAL THROMBECTOMY/ASPIRATION THROMBECTOMY OF BILATERAL PULMONARY ARTERIES COMPARISON:  CT 08/03/2023 MEDICATIONS: None. ANESTHESIA/SEDATION: The the patient was intubated in the ICU before presentation to Interventional Radiology, with general endotracheal tube anesthesia during the procedure. Fentanyl drip. Intubation was performed in ICU. Interventional radiology, and the ICU nursing staff were present throughout the procedure for additional monitoring and care. 75 minutes of case time, under fentanyl drip sedation. FLUOROSCOPY: Radiation Exposure Index (as provided by the fluoroscopic device): 467 mGy Kerma COMPLICATIONS: None TECHNIQUE: Informed consent was obtained from the patient's family following explanation of the procedure, risks, benefits and alternatives. Specific risks include bleeding, infection, contrast reaction, kidney injury, venous injury, life-threatening hemorrhage including brain hemorrhage, gastrointestinal hemorrhage, epistaxis, need for further surgery, need for further procedure, cardiac arrhythmia, cardiopulmonary collapse, death. The patient understands, agrees and consents for the procedure. All questions were addressed. Patient is position supine position on the fluoroscopy table. Maximal barrier sterile technique utilized including caps, mask, sterile gowns, sterile gloves, large sterile drape, hand hygiene, and chlorhexidine prep. 1% lidocaine used for local anesthesia. Moderate sedation was provided. Ultrasound survey of the right inguinal region was performed with images stored and sent to PACs, confirming patency of the right common femoral vein. A micro puncture kit was used access the right common femoral vein under ultrasound. With excellent blood flow returned, Mandril wire was passed through the needle, entering the iliac vein. Eleven blade was used to make an incision at the needle and the needle was removed from the wire. The 4  French sheath was advanced on the wire. The wire the inner introducer were removed and 035 Bentson wire was passed through the 4 French sheath into the IVC under fluoroscopy. Standard 6 French vascular sheath was placed. The dilator was removed and the sheath was flushed. Combination of a angled pigtail catheter and a Bentson wire was then used to navigate to the right heart. Angled catheter used to select the main pulmonary artery. Wire was removed and angiogram was performed. Pressure measurement was obtained. Pulmonary angiogram was performed. After review, there were filling defects within both of the right and left pulmonary arteries extending into the upper lobar segments. We elected to proceed with mechanical thrombectomy. Roadrunner wire was then passed through the pigtail catheter, positioned into the right pulmonary artery branches. The pigtail catheter was removed. The 6 French sheath was removed and a sixteen [place] Gore dry seal sheath was then passed over the wire into the IVC. Dilator was removed and the dry seal hub was inflated for hemostasis. The lightening 16 aspiration thrombectomy catheter was then passed on the wire. During the initial positioning of the aspiration catheter, the wire withdrew into the main pulmonary artery. Wire was then repositioned into the left-sided arteries, which we addressed first. Aspiration thrombectomy was performed with the 16 [place] device. After the initial aspiration thrombectomy, the aspiration catheter was plugged. Catheter needed removal. After clearing of the catheter outside of the patient, the pigtail catheter was advanced on the wire  into the main pulmonary artery. Repeat angiogram was performed, as well as repeat pulmonary artery pressure measurement recorded. We then proceeded with treatment of the right-sided pulmonary arteries. The catheter was positioned with a rose in wire into the right-sided pulmonary arteries. The lightening 16 device was advanced  on the Rose an wire into the right-sided pulmonary arteries. Aspiration thrombectomy was performed targeting the filling defects of the right pulmonary artery extending to mainly into the upper lobe branches. After the initial passive aspiration thrombectomy, the catheter was plugged. Catheter was removed from the sheath and the catheter was cleared. The pigtail catheter was then readvanced on the Rose an wire. A repeat angiogram was performed as well as pulmonary artery pressures. After review of the images, and review of the patient's current vital signs after treatment of bilateral pulmonary arteries, we elected to withdraw. All catheters and wires were removed. The sheath was removed with suture placed at the access site. Manual pressure was used for 20 minutes. Pressure bandage was applied. The patient tolerated the procedure well and remained hemodynamically stable throughout. No complications were encountered.  220 cc estimated blood loss. FINDINGS: Ultrasound demonstrates patent right common femoral vein Baseline pulmonary artery angiogram demonstrates filling defects within the bilateral left and right pulmonary arteries extending into predominantly upper lobe branches in the segmental and subsegmental distribution. Initial PA pressure: 43/9 (26)PA pressure after treating 1st side/left: 47/10 (27)PA pressure after treating 2nd side/right: 49/17 (32) Final angiogram images after aspiration thrombectomy demonstrates resolution of the filling defects extending into the upper lobar segments, with no large residual filling defects within the main pulmonary artery, bilateral left/right pulmonary arteries, or the lobar or upper lobe proximal segmental branches. Likely some residual segmental/subsegmental branches affected in the left lower lobe. IMPRESSION: Status post ultrasound guided access right common femoral vein for pulmonary angiogram/pressure measurements and aspiration thrombectomy of bilateral pulmonary  arteries for treatment of high-risk/massive pulmonary embolism. Signed, Yvone Neu. Miachel Roux, RPVI [facility] Radiology Electronically Signed   By: Gilmer Mor D.O.   On: 08/04/2023 08:38   DG Chest Port 1 View  Result Date: 08/04/2023 CLINICAL DATA:  Status post intubation, known PE bilaterally. EXAM: PORTABLE CHEST 1 VIEW COMPARISON:  01/13/2021 FINDINGS: Cardiac shadow is prominent but accentuated by the frontal technique. Endotracheal tube and gastric catheter are noted in satisfactory position. No focal infiltrate is seen. No bony abnormality is noted. IMPRESSION: Tubes and lines in satisfactory position. No acute abnormality noted. Electronically Signed   By: Alcide Clever M.D.   On: 08/04/2023 00:01   CT Angio Chest Pulmonary Embolism (PE) W or WO Contrast  Result Date: 08/03/2023 CLINICAL DATA:  Shortness of breath EXAM: CT ANGIOGRAPHY CHEST WITH CONTRAST TECHNIQUE: Multidetector CT imaging of the chest was performed using the standard protocol during bolus administration of intravenous contrast. Multiplanar CT image reconstructions and MIPs were obtained to evaluate the vascular anatomy. RADIATION DOSE REDUCTION: This exam was performed according to the departmental dose-optimization program which includes automated exposure control, adjustment of the mA and/or kV according to patient size and/or use of iterative reconstruction technique. CONTRAST:  80mL OMNIPAQUE IOHEXOL 350 MG/ML SOLN COMPARISON:  01/02/2019 FINDINGS: Cardiovascular: Thoracic aorta demonstrates atherosclerotic calcification. No aneurysmal dilatation is noted. Pulmonary artery shows a normal branching pattern bilaterally. Extensive bilateral pulmonary emboli are noted in the lobar branches as well as a large saddle component across the bifurcation. RV LV ratio is measured at 1.74 consistent with significant right heart strain.  Coronary calcifications are noted. No cardiac  enlargement is seen. 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 Mediastinum/Nodes: Thoracic inlet is within normal limits. No hilar or mediastinal adenopathy is noted. Esophagus as visualized is within normal limits. Lungs/Pleura: Lungs are well aerated bilaterally. No focal infiltrate or sizable effusion is noted. Upper Abdomen: Visualized upper abdomen shows no acute abnormality. Musculoskeletal: No acute bony abnormality is noted. Review of the MIP images confirms the above findings. IMPRESSION: Extensive bilateral pulmonary emboli as described with evidence of right heart strain within RV/LV ratio of 1.74. No other focal abnormality is noted. Aortic Atherosclerosis (ICD10-I70.0). Critical Value/emergent results were called by telephone at the time of interpretation on 08/03/2023 at 9:43 pm to Dr. Glyn Ade , who verbally acknowledged these results. Electronically Signed   By: Alcide Clever M.D.   On: 08/03/2023 21:46   MYOCARDIAL PERFUSION IMAGING  Result Date: 08/02/2023   LV perfusion is normal. There is no evidence of ischemia. There is no evidence of infarction.   Left ventricular function is normal. Nuclear stress EF: 69%. The left ventricular ejection fraction is hyperdynamic (>65%). End diastolic cavity size is normal.   The study is normal. The study is low risk.    Labs:  CBC: Recent Labs    08/03/23 1954 08/03/23 2359 08/04/23 0032 08/04/23 0249 08/04/23 0522 08/04/23 0843  WBC 6.7 8.3  --  12.7*  --   --   HGB 11.7* 9.4* 9.2* 10.9* 9.9* 9.5*  HCT 35.9* 31.0* 27.0* 32.9* 29.0* 28.0*  PLT 220 146*  --  151  --   --     COAGS: Recent Labs    08/03/23 2359 08/04/23 0249  INR 2.1*  --   APTT 141* 103*    BMP: Recent Labs    08/03/23 1954 08/04/23 0032 08/04/23 0249 08/04/23 0522 08/04/23 0843  NA 140 139 141 137 137  K 5.2* 4.4 5.2* 4.8 4.4  CL 105  --  108  --   --   CO2 12*  --  9*  --   --   GLUCOSE 101*  --  212*  --   --   BUN 34*  --  31*  --    --   CALCIUM 9.6  --  8.2*  --   --   CREATININE 1.73*  --  1.82*  --   --   GFRNONAA 32*  --  30*  --   --     LIVER FUNCTION TESTS: No results for input(s): "BILITOT", "AST", "ALT", "ALKPHOS", "PROT", "ALBUMIN" in the last 8760 hours.  Assessment and Plan:  9/19 PE Thrombectomy, post procedure rounding IR signing off   Electronically Signed: Robet Leu, PA-C 08/04/2023, 11:27 AM   I spent a total of 15 Minutes at the the patient's bedside AND on the patient's hospital floor or unit, greater than 50% of which was counseling/coordinating care for Pulmonary Embolus Thrombectomy

## 2023-08-05 ENCOUNTER — Inpatient Hospital Stay (HOSPITAL_COMMUNITY): Payer: Medicare PPO

## 2023-08-05 ENCOUNTER — Encounter (HOSPITAL_COMMUNITY): Payer: Self-pay | Admitting: Interventional Radiology

## 2023-08-05 DIAGNOSIS — J9601 Acute respiratory failure with hypoxia: Secondary | ICD-10-CM

## 2023-08-05 DIAGNOSIS — Z86711 Personal history of pulmonary embolism: Secondary | ICD-10-CM

## 2023-08-05 DIAGNOSIS — N179 Acute kidney failure, unspecified: Secondary | ICD-10-CM | POA: Diagnosis not present

## 2023-08-05 DIAGNOSIS — I50810 Right heart failure, unspecified: Secondary | ICD-10-CM

## 2023-08-05 DIAGNOSIS — I2699 Other pulmonary embolism without acute cor pulmonale: Secondary | ICD-10-CM | POA: Diagnosis not present

## 2023-08-05 DIAGNOSIS — R569 Unspecified convulsions: Secondary | ICD-10-CM | POA: Diagnosis not present

## 2023-08-05 LAB — MAGNESIUM
Magnesium: 1.1 mg/dL — ABNORMAL LOW (ref 1.7–2.4)
Magnesium: 2.9 mg/dL — ABNORMAL HIGH (ref 1.7–2.4)
Magnesium: 2.9 mg/dL — ABNORMAL HIGH (ref 1.7–2.4)

## 2023-08-05 LAB — HEPATIC FUNCTION PANEL
ALT: 208 U/L — ABNORMAL HIGH (ref 0–44)
AST: 503 U/L — ABNORMAL HIGH (ref 15–41)
Albumin: 2.4 g/dL — ABNORMAL LOW (ref 3.5–5.0)
Alkaline Phosphatase: 51 U/L (ref 38–126)
Bilirubin, Direct: 0.2 mg/dL (ref 0.0–0.2)
Indirect Bilirubin: 0.7 mg/dL (ref 0.3–0.9)
Total Bilirubin: 0.9 mg/dL (ref 0.3–1.2)
Total Protein: 4.5 g/dL — ABNORMAL LOW (ref 6.5–8.1)

## 2023-08-05 LAB — BASIC METABOLIC PANEL
Anion gap: 14 (ref 5–15)
BUN: 34 mg/dL — ABNORMAL HIGH (ref 8–23)
CO2: 24 mmol/L (ref 22–32)
Calcium: 7.7 mg/dL — ABNORMAL LOW (ref 8.9–10.3)
Chloride: 97 mmol/L — ABNORMAL LOW (ref 98–111)
Creatinine, Ser: 1.69 mg/dL — ABNORMAL HIGH (ref 0.44–1.00)
GFR, Estimated: 33 mL/min — ABNORMAL LOW (ref >60)
Glucose, Bld: 205 mg/dL — ABNORMAL HIGH (ref 70–99)
Potassium: 3.4 mmol/L — ABNORMAL LOW (ref 3.5–5.1)
Sodium: 135 mmol/L (ref 135–145)

## 2023-08-05 LAB — LACTIC ACID, PLASMA: Lactic Acid, Venous: 1.1 mmol/L (ref 0.5–1.9)

## 2023-08-05 LAB — PHOSPHORUS
Phosphorus: 3.9 mg/dL (ref 2.5–4.6)
Phosphorus: 3.7 mg/dL (ref 2.5–4.6)

## 2023-08-05 LAB — CBC
HCT: 21.1 % — ABNORMAL LOW (ref 36.0–46.0)
Hemoglobin: 7.2 g/dL — ABNORMAL LOW (ref 12.0–15.0)
MCH: 34.8 pg — ABNORMAL HIGH (ref 26.0–34.0)
MCHC: 34.1 g/dL (ref 30.0–36.0)
MCV: 101.9 fL — ABNORMAL HIGH (ref 80.0–100.0)
Platelets: 166 10*3/uL (ref 150–400)
RBC: 2.07 MIL/uL — ABNORMAL LOW (ref 3.87–5.11)
RDW: 15.4 % (ref 11.5–15.5)
WBC: 9.2 10*3/uL (ref 4.0–10.5)
nRBC: 6.4 % — ABNORMAL HIGH (ref 0.0–0.2)

## 2023-08-05 LAB — POCT I-STAT 7, (LYTES, BLD GAS, ICA,H+H)
Acid-Base Excess: 1 mmol/L (ref 0.0–2.0)
Bicarbonate: 24.7 mmol/L (ref 20.0–28.0)
Calcium, Ion: 1.01 mmol/L — ABNORMAL LOW (ref 1.15–1.40)
HCT: 22 % — ABNORMAL LOW (ref 36.0–46.0)
Hemoglobin: 7.5 g/dL — ABNORMAL LOW (ref 12.0–15.0)
O2 Saturation: 99 %
Patient temperature: 37.5
Potassium: 3.6 mmol/L (ref 3.5–5.1)
Sodium: 132 mmol/L — ABNORMAL LOW (ref 135–145)
TCO2: 26 mmol/L (ref 22–32)
pCO2 arterial: 36.1 mmHg (ref 32–48)
pH, Arterial: 7.445 (ref 7.35–7.45)
pO2, Arterial: 129 mmHg — ABNORMAL HIGH (ref 83–108)

## 2023-08-05 LAB — HEPARIN LEVEL (UNFRACTIONATED)
Heparin Unfractionated: 0.71 IU/mL — ABNORMAL HIGH (ref 0.30–0.70)
Heparin Unfractionated: 0.88 IU/mL — ABNORMAL HIGH (ref 0.30–0.70)

## 2023-08-05 LAB — GLUCOSE, CAPILLARY
Glucose-Capillary: 181 mg/dL — ABNORMAL HIGH (ref 70–99)
Glucose-Capillary: 209 mg/dL — ABNORMAL HIGH (ref 70–99)
Glucose-Capillary: 193 mg/dL — ABNORMAL HIGH (ref 70–99)
Glucose-Capillary: 163 mg/dL — ABNORMAL HIGH (ref 70–99)
Glucose-Capillary: 146 mg/dL — ABNORMAL HIGH (ref 70–99)
Glucose-Capillary: 190 mg/dL — ABNORMAL HIGH (ref 70–99)

## 2023-08-05 LAB — COOXEMETRY PANEL
Carboxyhemoglobin: 1.1 % (ref 0.5–1.5)
Methemoglobin: 1 % (ref 0.0–1.5)
O2 Saturation: 69.2 %
Total hemoglobin: 7.7 g/dL — ABNORMAL LOW (ref 12.0–16.0)

## 2023-08-05 MED ORDER — ORAL CARE MOUTH RINSE: 15.0000 mL | OROMUCOSAL | Status: DC | PRN

## 2023-08-05 MED ORDER — HYDROCORTISONE SOD SUC (PF) 100 MG IJ SOLR
100.0000 mg | Freq: Two times a day (BID) | INTRAMUSCULAR | Status: DC
  Administered 2023-08-05 – 2023-08-07 (×5): 100 mg via INTRAVENOUS
  Filled 2023-08-05 (×5): qty 2

## 2023-08-05 MED ORDER — MAGNESIUM SULFATE 4 GM/100ML IV SOLN
4.0000 g | Freq: Once | INTRAVENOUS | Status: AC
  Administered 2023-08-05: 4 g via INTRAVENOUS
  Filled 2023-08-05: qty 100

## 2023-08-05 MED ORDER — POTASSIUM CHLORIDE 20 MEQ PO PACK
40.0000 meq | PACK | Freq: Once | ORAL | Status: AC
  Administered 2023-08-05: 40 meq
  Filled 2023-08-05: qty 2

## 2023-08-05 MED ORDER — INSULIN DETEMIR 100 UNIT/ML ~~LOC~~ SOLN
8.0000 [IU] | Freq: Two times a day (BID) | SUBCUTANEOUS | Status: DC
  Administered 2023-08-05 – 2023-08-07 (×4): 8 [IU] via SUBCUTANEOUS
  Filled 2023-08-05 (×9): qty 0.08

## 2023-08-05 MED ORDER — POTASSIUM CHLORIDE 10 MEQ/50ML IV SOLN
10.0000 meq | INTRAVENOUS | Status: AC
  Administered 2023-08-05 (×4): 10 meq via INTRAVENOUS
  Filled 2023-08-05 (×4): qty 50

## 2023-08-05 MED ORDER — MAGNESIUM SULFATE 2 GM/50ML IV SOLN
2.0000 g | Freq: Once | INTRAVENOUS | Status: AC
  Administered 2023-08-05: 2 g via INTRAVENOUS
  Filled 2023-08-05: qty 50

## 2023-08-05 MED ORDER — SODIUM CHLORIDE 0.9 % IV SOLN: INTRAVENOUS | Status: DC | PRN

## 2023-08-05 MED ORDER — FUROSEMIDE 10 MG/ML IJ SOLN
40.0000 mg | Freq: Once | INTRAMUSCULAR | Status: AC
  Administered 2023-08-05: 40 mg via INTRAVENOUS
  Filled 2023-08-05: qty 4

## 2023-08-05 MED ORDER — MILRINONE LACTATE IN DEXTROSE 20-5 MG/100ML-% IV SOLN: 0.1250 ug/kg/min | INTRAVENOUS | Status: DC

## 2023-08-05 MED ORDER — MILRINONE LACTATE IN DEXTROSE 20-5 MG/100ML-% IV SOLN: 0.2500 ug/kg/min | INTRAVENOUS | Status: DC

## 2023-08-05 MED ORDER — ONDANSETRON HCL 4 MG/2ML IJ SOLN
4.0000 mg | Freq: Four times a day (QID) | INTRAMUSCULAR | Status: DC | PRN
  Administered 2023-08-05 – 2023-08-11 (×6): 4 mg via INTRAVENOUS
  Filled 2023-08-05 (×6): qty 2

## 2023-08-05 MED ORDER — MILRINONE LACTATE IN DEXTROSE 20-5 MG/100ML-% IV SOLN
0.1250 ug/kg/min | INTRAVENOUS | Status: DC
  Administered 2023-08-05: 0.125 ug/kg/min via INTRAVENOUS
  Filled 2023-08-05: qty 100

## 2023-08-05 MED ORDER — POTASSIUM CHLORIDE 20 MEQ PO PACK
40.0000 meq | PACK | Freq: Once | ORAL | Status: DC
  Filled 2023-08-05: qty 2

## 2023-08-05 MED ORDER — ONDANSETRON HCL 4 MG/2ML IJ SOLN: 4.0000 mg | Freq: Four times a day (QID) | INTRAMUSCULAR | Status: DC

## 2023-08-05 MED ORDER — CALCIUM GLUCONATE-NACL 2-0.675 GM/100ML-% IV SOLN
2.0000 g | Freq: Once | INTRAVENOUS | Status: AC
  Administered 2023-08-05: 2000 mg via INTRAVENOUS
  Filled 2023-08-05: qty 100

## 2023-08-05 NOTE — Progress Notes (Signed)
ANTICOAGULATION CONSULT NOTE - Follow Up Consult  Pharmacy Consult for Heparin IV Indication: pulmonary embolus  Allergies  Allergen Reactions   Actos [Pioglitazone] Other (See Comments)    REACTION: pt states INTOL w/ edema   Codeine Other (See Comments)    REACTION: vomiting   Januvia [Sitagliptin] Nausea Only   Lactose Intolerance (Gi) Diarrhea   Morphine Nausea Only and Nausea And Vomiting    REACTION: vomiting REACTION: vomiting   Repatha [Evolocumab]     WAS NOT EFFECTIVE IN LOWERING LDL    Patient Measurements: Height: 5\' 2"  (157.5 cm) Weight: 77.2 kg (170 lb 3.1 oz) IBW/kg (Calculated) : 50.1 Heparin Dosing Weight: 64.5 kg  Vital Signs: Temp: 99.7 F (37.6 C) (09/20 0715) BP: 104/70 (09/20 0700) Pulse Rate: 91 (09/20 0715)  Labs: Recent Labs    08/03/23 1954 08/03/23 2359 08/04/23 0032 08/04/23 0249 08/04/23 0522 08/04/23 0843 08/04/23 1123 08/04/23 1540 08/04/23 2036 08/05/23 0424 08/05/23 0601  HGB 11.7* 9.4*   < > 10.9* 9.9* 9.5*  --  8.5*  --   --   --   HCT 35.9* 31.0*   < > 32.9* 29.0* 28.0*  --  25.0*  --   --   --   PLT 220 146*  --  151  --   --   --   --   --   --   --   APTT  --  141*  --  103*  --   --  35  --   --   --   --   LABPROT  --  23.6*  --   --   --   --   --   --   --   --   --   INR  --  2.1*  --   --   --   --   --   --   --   --   --   HEPARINUNFRC  --   --   --   --   --   --   --   --  0.74*  --  0.71*  CREATININE 1.73*  --   --  1.82*  --   --   --   --   --  1.69*  --   TROPONINIHS 119* 122*  --   --   --   --   --   --   --   --   --    < > = values in this interval not displayed.    Estimated Creatinine Clearance: 30.6 mL/min (A) (by C-G formula based on SCr of 1.69 mg/dL (H)).   Medications:  Scheduled:   Chlorhexidine Gluconate Cloth  6 each Topical Daily   docusate  100 mg Per Tube BID   famotidine  20 mg Per Tube Daily   feeding supplement (PROSource TF20)  60 mL Per Tube Daily   fentaNYL (SUBLIMAZE)  injection  25 mcg Intravenous Once   hydrocortisone sod succinate (SOLU-CORTEF) inj  100 mg Intravenous Q12H   insulin aspart  0-20 Units Subcutaneous Q4H   mouth rinse  15 mL Mouth Rinse Q2H   polyethylene glycol  17 g Per Tube Daily   Infusions:   sodium chloride     feeding supplement (VITAL 1.5 CAL) 20 mL/hr at 08/05/23 0700   fentaNYL infusion INTRAVENOUS 125 mcg/hr (08/05/23 0700)   heparin 750 Units/hr (08/05/23 0700)   magnesium sulfate bolus IVPB 4 g (08/05/23  9604)   Followed by   magnesium sulfate bolus IVPB     milrinone 0.25 mcg/kg/min (08/05/23 0700)   norepinephrine (LEVOPHED) Adult infusion 11 mcg/min (08/05/23 0700)   sodium bicarbonate 150 mEq in sterile water 1,150 mL infusion 50 mL/hr at 08/05/23 0700   vasopressin 0.03 Units/min (08/05/23 0700)    Assessment: 67 years of age female with saddle PE initially on IV Heparin then coded and received TNK at 2300 PM.   Heparin level slightly subtherapeutic at 0.71 on 750 units/hr. Hgb trending down 8.5 -> 7.5. Heparin level drawn from central line while heparin running peripherally and no signs of bleeding per RN.    Goal of Therapy:  Heparin level 0.3-0.7 units/ml  Monitor platelets by anticoagulation protocol: Yes   Plan:  Decrease heparin infusion to 700 units/hr 8 hour heparin level  Daily CBC and heparin level  Monitor for s/sx of bleeding   Thank you for involving pharmacy in the patient's care.   Theotis Burrow, PharmD PGY1 Acute Care Pharmacy Resident  08/05/2023 8:53 AM

## 2023-08-05 NOTE — Procedures (Signed)
Extubation Procedure Note  Patient Details:   Name: Jocelyn [name] DOB: [birth year] MRN: [id number]   Airway Documentation:    Vent end date: 08/05/23 Vent end time: 1040   Evaluation  O2 sats: stable throughout Complications: No apparent complications Patient did tolerate procedure well. Bilateral Breath Sounds: Clear, Diminished    Patient extubated per written order. Cuff leak present prior to extubation, no stridor post extubation. Patient placed on 4L [place] and tolerating well. Post extubation patient was able to speak her name and was aware of time and place.      Yes  [name] 08/05/2023, 10:42 AM

## 2023-08-05 NOTE — Plan of Care (Signed)
Problem: Nutrition: Goal: Adequate nutrition will be maintained Outcome: Progressing   Problem: Pain Managment: Goal: General experience of comfort will improve Outcome: Progressing   Problem: Safety: Goal: Ability to remain free from injury will improve Outcome: Progressing

## 2023-08-05 NOTE — Progress Notes (Signed)
eLink Physician-Brief Progress Note Patient Name: LELAINA FAGOT DOB: [birth year] MRN: [id number]   Date of Service  08/05/2023  HPI/Events of Note  Pt with worsening headache, may be due to milrinone effect.   eICU Interventions  Decreased milrinone to 0.127mcg/kg Will uptitrate levophed to maintain goal MAP.         Jaylanie Boschee M DELA CRUZ 08/05/2023, 9:13 PM

## 2023-08-05 NOTE — Inpatient Diabetes Management (Addendum)
Inpatient Diabetes Program Recommendations  AACE/ADA: New Consensus Statement on Inpatient Glycemic Control (2015)  Target Ranges:  Prepandial:   less than 140 mg/dL      Peak postprandial:   less than 180 mg/dL (1-2 hours)      Critically ill patients:  140 - 180 mg/dL   Lab Results  Component Value Date   GLUCAP 209 (H) 08/05/2023   HGBA1C 6.2 (H) 08/03/2023    Review of Glycemic Control  Latest Reference Range & Units 08/04/23 07:51 08/04/23 11:34 08/04/23 15:39 08/04/23 19:29 08/04/23 23:25 08/05/23 03:18 08/05/23 07:26  Glucose-Capillary 70 - 99 mg/dL 202 (H) 542 (H) 706 (H) 183 (H) 188 (H) 181 (H) 209 (H)   Diabetes history: DM 2 Outpatient Diabetes medications:  empagliflozin-metformin 25-1000 1 tablet daily Current orders for Inpatient glycemic control:  Novolog 0-20 units Q4 hours Levemir 8 units bid  Note: Glucose trends in the 200 range.  Solucortef 100 mg Q12 hours Vital 1.5 40 ml/hour (d/c'd today)  Inpatient Diabetes Program Recommendations:    Per RN, TF held and pt is extubated. Would recommend D/c'ng Novolog TF coverage recommended earlier and hold Levemir dose until this evening. Pt may still need a portion of it due to the solucortef dose of 100 mg q12 hours.  Thanks,  Christena Deem RN, MSN, BC-ADM Inpatient Diabetes Coordinator Team Pager [phone] (8a-5p)

## 2023-08-05 NOTE — Progress Notes (Signed)
Subjective: No further seizures  Exam: Vitals:   08/05/23 0845 08/05/23 1015  BP: (!) 89/63   Pulse: 95 100  Resp: (!) 23 (!) 23  Temp: 99.3 F (37.4 C) 99.3 F (37.4 C)  SpO2: 100% 100%   Gen: In bed, NAD Resp: non-labored breathing, no acute distress Abd: soft, nt  Neuro: MS: Awake, alert, answers questions appropriately with head nods/shakes CN: Visual fields full, pupils equal round reactive, extraocular movements intact Motor: She is able to follow commands in all four extremities Sensory: Endorses symmetric sensation  Pertinent Labs: Ionized calcium from yesterday: 0.95, today 1.01 Mg 1.1 EEG negative  Impression: 67 yo F with possible seizure in the setting of hypomagnesemia.  In the setting, I would not favor starting antiepileptics unless she were to have continued spells.  There was a finding on CT which could be concerning for venous sinus thrombosis and I think that this is worth continuing to investigate, but if MRI is negative, then I would not make any other changes at the current time.    Recommendations: 1) we will follow-up MRI, if negative neurology will be signed off  Ritta Slot, MD [facility] [phone]  If 7pm- 7am, please page neurology on call as listed in AMION.

## 2023-08-05 NOTE — Progress Notes (Addendum)
ANTICOAGULATION CONSULT NOTE - Follow Up Consult  Pharmacy Consult for Heparin IV Indication: pulmonary embolus  Allergies  Allergen Reactions   Actos [Pioglitazone] Other (See Comments)    REACTION: pt states INTOL w/ edema   Codeine Other (See Comments)    REACTION: vomiting   Januvia [Sitagliptin] Nausea Only   Lactose Intolerance (Gi) Diarrhea   Morphine Nausea Only and Nausea And Vomiting    REACTION: vomiting REACTION: vomiting   Repatha [Evolocumab]     WAS NOT EFFECTIVE IN LOWERING LDL    Patient Measurements: Height: 5\' 2"  (157.5 cm) Weight: 77.2 kg (170 lb 3.1 oz) IBW/kg (Calculated) : 50.1 Heparin Dosing Weight: 64.5 kg  Vital Signs: Temp: 99.1 F (37.3 C) (09/20 1645) BP: 95/50 (09/20 1645) Pulse Rate: 106 (09/20 1645)  Labs: Recent Labs    08/03/23 1954 08/03/23 2359 08/04/23 0032 08/04/23 0249 08/04/23 0522 08/04/23 1123 08/04/23 1540 08/04/23 2036 08/05/23 0424 08/05/23 0601 08/05/23 0756 08/05/23 1616  HGB 11.7* 9.4*   < > 10.9*   < >  --  8.5*  --   --  7.2* 7.5*  --   HCT 35.9* 31.0*   < > 32.9*   < >  --  25.0*  --   --  21.1* 22.0*  --   PLT 220 146*  --  151  --   --   --   --   --  166  --   --   APTT  --  141*  --  103*  --  35  --   --   --   --   --   --   LABPROT  --  23.6*  --   --   --   --   --   --   --   --   --   --   INR  --  2.1*  --   --   --   --   --   --   --   --   --   --   HEPARINUNFRC  --   --   --   --   --   --   --  0.74*  --  0.71*  --  0.88*  CREATININE 1.73*  --   --  1.82*  --   --   --   --  1.69*  --   --   --   TROPONINIHS 119* 122*  --   --   --   --   --   --   --   --   --   --    < > = values in this interval not displayed.    Estimated Creatinine Clearance: 30.6 mL/min (A) (by C-G formula based on SCr of 1.69 mg/dL (H)).   Medications:  Scheduled:   Chlorhexidine Gluconate Cloth  6 each Topical Daily   docusate  100 mg Per Tube BID   famotidine  20 mg Per Tube Daily   feeding supplement  (PROSource TF20)  60 mL Per Tube Daily   fentaNYL (SUBLIMAZE) injection  25 mcg Intravenous Once   hydrocortisone sod succinate (SOLU-CORTEF) inj  100 mg Intravenous Q12H   insulin aspart  0-20 Units Subcutaneous Q4H   insulin detemir  8 Units Subcutaneous BID   polyethylene glycol  17 g Per Tube Daily   Infusions:   sodium chloride     sodium chloride 10 mL/hr at 08/05/23 1659  feeding supplement (VITAL 1.5 CAL) 20 mL/hr at 08/05/23 1659   heparin 700 Units/hr (08/05/23 1659)   milrinone 0.25 mcg/kg/min (08/05/23 1659)   norepinephrine (LEVOPHED) Adult infusion 2 mcg/min (08/05/23 1659)   vasopressin Stopped (08/05/23 1248)    Assessment: 67 years of age female with saddle PE initially on IV Heparin then coded and received TNK at 2300 PM on 9/18.  Heparin level supratherapeutic (0.88) despite rate decrease to 700 units/hr.  Confirmed drawn appropriately - running through peripheral, drawn from central line.  No bleeding or infusion issues per RN.  Goal of Therapy:  Heparin level 0.3-0.7 units/ml  Monitor platelets by anticoagulation protocol: Yes   Plan:  Decrease heparin infusion to 600 units/hr 8 hour heparin level Daily CBC and heparin level  Monitor for s/sx of bleeding   Thank you for involving pharmacy in the patient's care.   Trixie Rude, PharmD Clinical Pharmacist 08/05/2023  5:34 PM

## 2023-08-05 NOTE — Progress Notes (Signed)
[facility] ADULT ICU REPLACEMENT PROTOCOL   The patient does apply for the [facility] Adult ICU Electrolyte Replacment Protocol based on the criteria listed below:   1.Exclusion criteria: TCTS, ECMO, Dialysis, and Myasthenia Gravis patients 2. Is GFR >/= 30 ml/min? Yes.    Patient's GFR today is 33 3. Is SCr </= 2? Yes.   Patient's SCr is 1.69 mg/dL 4. Did SCr increase >/= 0.5 in 24 hours? No. 5.Pt's weight >40kg  Yes.   6. Abnormal electrolyte(s): k 3.4, mag 1.1  7. Electrolytes replaced per protocol 8.  Call MD STAT for K+ </= 2.5, Phos </= 1, or Mag </= 1 Physician:    Darrick Grinder 08/05/2023 6:34 AM

## 2023-08-05 NOTE — Progress Notes (Signed)
NAME:  Jocelyn [name], MRN:  [id number], DOB:  [birth year], LOS: 2 ADMISSION DATE:  08/03/2023, CONSULTATION DATE:  08/05/2023  REFERRING MD:  Doran Durand EDP, CHIEF COMPLAINT: Shortness of breath  History of Present Illness:  67 year old woman presented to ED with shortness of breath for 1 week. She also experienced presyncope. She was evaluated by cardiology on 9/12 for vague symptoms of fatigue, myocardial perfusion study was performed 9/17 which was normal. She was hypertensive on arrival to the ED, hypothermic 95 7, hypoxic requiring 6 L nasal cannula. Initial labs showed lactate of 7.6.  Code sepsis was initiated, COVID testing was negative. CT angiogram chest was obtained which showed saddle pulm embolism as well as extensive bilateral emboli with RV/LV ratio 1.7. Soon after arrival to ICU, she developed PEA arrest with ROSC after 5 minutes of CPR, epi x 1, TNK given, intubated post arrest. Admitted to ICU for ongoing care.  Pertinent  Medical History  Inferior wall STEMI 06/2017, status post DES to RCA Right subclavian lusoria variant Hyperlipidemia Polymyositis on azathioprine and steroids Sarcoidosis, inactive by imaging  Significant Hospital Events: Including procedures, antibiotic start and stop dates in addition to other pertinent events   9/18 PEA arrest after arrival to ICU. Given TNK for massive PE, underwent IR thrombectomy 9/19 Concern for seizure like activity but no evidence on EEG, CT head negative for acute intracraniail processes. 9/20 Extubated Interim History / Subjective:   CT head negative for acute head bleed and EEG negative for seizure activity. Will trial extubation today. Did have 1x episode of emesis. Remains on vasopressor support.  Objective   Blood pressure 118/71, pulse 92, temperature 99.7 F (37.6 C), resp. rate 18, height 5\' 2"  (1.575 m), weight 77.2 kg, SpO2 100%.    Vent Mode: PRVC FiO2 (%):  [40 %-50 %] 40 % Set Rate:  [18 bmp-30 bmp] 18 bmp Vt  Set:  [480 mL] 480 mL PEEP:  [5 cmH20] 5 cmH20 Plateau Pressure:  [18 cmH20-23 cmH20] 18 cmH20   Intake/Output Summary (Last 24 hours) at 08/05/2023 0708 Last data filed at 08/05/2023 0600 Gross per 24 hour  Intake 4108.95 ml  Output 480 ml  Net 3628.95 ml   Filed Weights   08/03/23 1937 08/05/23 0425  Weight: 68.9 kg 77.2 kg    Examination: Constitutional:Elderly female intubated in bed, in no acute distress. Cardio:Regular rate and rhythm. No murmurs, rubs, or gallops. Pulm:Clear to auscultation bilaterally. Normal work of breathing on room air. Abdomen:Soft, nontender, nondistended. ZHY:QMVHQION for extremity edema. Skin:Warm and dry. Neuro:Nods head, follows basic commands. Not on any sedation at this time.   Labs: BMP: K 3.4, Cl 97, BUN 34 (31)/Cr 1.69 (1.82), Ca 7.7, GFR 33 Phos 3.9 Mag 1.1 Co-ox O2 69.2%, carboxyhemoglobin 1.1, methemoglobin 1.0, total hemoglobin 7.7 Tprotein 4.5, albumin 2.4, AST 503, ALT 208   Infusions: Fentanyl Milrinone Norepinephrine Vasopressin  Imaging: CT head w/o contrast: 1. Diffusely hyperdense appearance of the dural venous sinuses could be secondary to recent administration of iodinated contrast material for pulmonary thrombectomy. However this could also be seen in the setting of diffuse dural venous sinus thrombosis. If this is a clinical consideration, further evaluation with a CT or MR venogram is recommended. 2. Otherwise no CT finding to explain altered mental status.  Echo 1. Left ventricular ejection fraction, by estimation, is 60 to 65%. The  left ventricle has normal function. The left ventricle has no regional wall motion abnormalities. There is mild concentric left ventricular hypertrophy. Left  ventricular diastolic parameters are indeterminate.   2. RVOT is dilated; RV appears dilated in short axis views. RV  McConnell's Sign; this can be seen in RV strain; it is best appreciated  with contrast imaging. Right ventricular  systolic function is mildly  reduced. The right ventricular size is dilated. There is normal pulmonary artery systolic pressure.   3. The mitral valve is grossly normal. No evidence of mitral valve regurgitation. No evidence of mitral stenosis.   4. The aortic valve was not well visualized. Aortic valve regurgitation is not visualized.   Resolved Hospital Problem list   Metabolic acidosis  Assessment & Plan:   Massive pulmonary embolism w/ cor pulmonale Cardiogenic shock S/p PEA arrest Acute respiratory failure with hypoxia 2/2 above S/p TNK which was given during PEA arrest, intervention by IR. On heparin now. Ongoing requirement for vasopressor support and mechanical ventilation. Plan: -Continue milrinone  -Will consult advanced HF team -F/u vascular duplex US -Continue heparin IV infusion -Continue levophed, will add on vasopressin; titrate rates as able with MAP goal >65 -Continue stress-dose steroids -Will trial SBT today for possible extubation -VAP, PAD protocol in place; RASS goal 0 to -1 -If secretions develop, plan to send tracheal aspirate and cover for aspiration in setting of PEA arrest  Lactic acidosis, resolved Shock liver/hepatic congestion Last checked yesterday afternoon, continued to be >9.0, now resolved to 1.1.  Co-ox with improvement since starting milrinone for R heart failure 09/19 PM.  Plan: -Continue milrinone -Heart failure consult as above  C/f seizure Spot EEG negative, CT head without signs of bleed but with possible dural venous sinus thrombosis. She is already receiving heparin. No recurrence of symptoms noted yesterday morning. Neurology has assessed patient, MRI/MRV ordered to further assess dural venous sinus thrombosis.  Plan: -F/u MRI/MRV brain/head -Continue heparin infusion  CAD s/p DES to RCA Plan: -Hold home plavix, losartan and cardizem  AKI Renal function is very mildly improved though overall stable. UOP still low, only 0.3  mL/kg/h over the course of yesterday (480 cc total). Suspect 2/2 shock, PEA arrest, contrast load earlier in admission. Has normal renal function at baseline. Will need to closely monitor not only renal labs but UOP for renal failure. Plan: -Trend renal function, UOP -D/c sodium bicarbonate as it is no longer needed  Hypomagnesemia Hypokalemia Plan: -Trend electrolytes, replete as indicated  Polymyositis  On azathioprine as OP. Plan: -Continue stress-dose steroids -Resume home azathioprine once able to take PO  Type 2 diabetes mellitus HbA1c 6.2%. No nutrition access at this time. Should have CorTrak placed today. Plan: -SSI, CBG gaol 140-180  Best Practice (right click and "Reselect all SmartList Selections" daily)   Diet/type: NPO DVT prophylaxis: systemic heparin GI prophylaxis: N/A Lines: Cenral line Foley:  Yes, and it is still needed Code Status:  full code Last date of multidisciplinary goals of care discussion: 09/18  Labs   CBC: Recent Labs  Lab 08/03/23 1954 08/03/23 2359 08/04/23 0032 08/04/23 0249 08/04/23 0522 08/04/23 0843 08/04/23 1540  WBC 6.7 8.3  --  12.7*  --   --   --   NEUTROABS 4.8  --   --   --   --   --   --   HGB 11.7* 9.4* 9.2* 10.9* 9.9* 9.5* 8.5*  HCT 35.9* 31.0* 27.0* 32.9* 29.0* 28.0* 25.0*  MCV 108.5* 115.2*  --  111.9*  --   --   --   PLT 220 146*  --  151  --   --   --  Basic Metabolic Panel: Recent Labs  Lab 08/03/23 1954 08/04/23 0032 08/04/23 0249 08/04/23 0522 08/04/23 0843 08/04/23 1540 08/04/23 1615 08/05/23 0424  NA 140   < > 141 137 137 136  --  135  K 5.2*   < > 5.2* 4.8 4.4 4.1  --  3.4*  CL 105  --  108  --   --   --   --  97*  CO2 12*  --  9*  --   --   --   --  24  GLUCOSE 101*  --  212*  --   --   --   --  205*  BUN 34*  --  31*  --   --   --   --  34*  CREATININE 1.73*  --  1.82*  --   --   --   --  1.69*  CALCIUM 9.6  --  8.2*  --   --   --   --  7.7*  MG  --   --  1.7  --   --   --  1.1* 1.1*   PHOS  --   --  7.5*  --   --   --  4.1 3.9   < > = values in this interval not displayed.   GFR: Estimated Creatinine Clearance: 30.6 mL/min (A) (by C-G formula based on SCr of 1.69 mg/dL (H)). Recent Labs  Lab 08/03/23 1954 08/03/23 2359 08/04/23 0249 08/04/23 1126  WBC 6.7 8.3 12.7*  --   LATICACIDVEN 7.6* >9.0*  --  >9.0*    Liver Function Tests: Recent Labs  Lab 08/05/23 0424  AST 503*  ALT 208*  ALKPHOS 51  BILITOT 0.9  PROT 4.5*  ALBUMIN 2.4*   No results for input(s): "LIPASE", "AMYLASE" in the last 168 hours. No results for input(s): "AMMONIA" in the last 168 hours.  ABG    Component Value Date/Time   PHART 7.494 (H) 08/04/2023 1540   PCO2ART 27.7 (L) 08/04/2023 1540   PO2ART 198 (H) 08/04/2023 1540   HCO3 21.2 08/04/2023 1540   TCO2 22 08/04/2023 1540   ACIDBASEDEF 1.0 08/04/2023 1540   O2SAT 69.2 08/05/2023 0424     Coagulation Profile: Recent Labs  Lab 08/03/23 2359  INR 2.1*    Cardiac Enzymes: No results for input(s): "CKTOTAL", "CKMB", "CKMBINDEX", "TROPONINI" in the last 168 hours.  HbA1C: Hgb A1c MFr Bld  Date/Time Value Ref Range Status  08/03/2023 11:59 PM 6.2 (H) 4.8 - 5.6 % Final    Comment:    (NOTE) Pre diabetes:          5.7%-6.4%  Diabetes:              >6.4%  Glycemic control for   <7.0% adults with diabetes   08/17/2018 04:20 PM 6.5 (H) 4.8 - 5.6 % Final    Comment:    (NOTE)         Prediabetes: 5.7 - 6.4         Diabetes: >6.4         Glycemic control for adults with diabetes: <7.0     CBG: Recent Labs  Lab 08/04/23 1134 08/04/23 1539 08/04/23 1929 08/04/23 2325 08/05/23 0318  GLUCAP 216* 184* 183* 188* 181*    Review of Systems:   Unable to obtain.  Past Medical History:  She,  has a past medical history of Acute bronchitis, Allergic rhinitis, cause unspecified, Anemia, Anxiety, B12 deficiency, BV (bacterial vaginosis) (  06/22/1996), Calculus of kidney, Calculus of kidney, Coronary atherosclerosis of  unspecified type of vessel, native or graft, Dizziness, Essential hypertension, benign, Fibroid (2003), Fibromyalgia, H/O dysmenorrhea (2008), H/O varicella, Headache(784.0), HSV-2 infection (2009), Hyperplastic colon polyp (05/16/2014), Irritable bowel syndrome, Meniere's disease, unspecified, Menses, irregular (2003), Myalgia and myositis, unspecified, Myocardial infarction (HCC), Obstructive sleep apnea (adult) (pediatric), Perimenopausal symptoms (2003), Pure hypercholesterolemia, Sarcoidosis, Type II or unspecified type diabetes mellitus without mention of complication, not stated as uncontrolled, Unspecified venous (peripheral) insufficiency, Vitamin D deficiency, Vulvitis (2010), and Yeast infection.   Surgical History:   Past Surgical History:  Procedure Laterality Date   COLONOSCOPY     CORONARY STENT INTERVENTION N/A 06/15/2017   Procedure: CORONARY STENT INTERVENTION;  Surgeon: Corky Crafts, MD;  Location: [facility] INVASIVE CV LAB;  Service: Cardiovascular;  Laterality: N/A;   heart catherization     IR ANGIOGRAM PULMONARY BILATERAL SELECTIVE  08/04/2023   IR ANGIOGRAM SELECTIVE EACH ADDITIONAL VESSEL  08/04/2023   IR ANGIOGRAM SELECTIVE EACH ADDITIONAL VESSEL  08/04/2023   IR THROMBECT PRIM MECH INIT (INCLU) MOD SED  08/04/2023   IR US GUIDE VASC ACCESS RIGHT  08/04/2023   LEFT HEART CATH AND CORONARY ANGIOGRAPHY N/A 06/15/2017   Procedure: LEFT HEART CATH AND CORONARY ANGIOGRAPHY;  Surgeon: Corky Crafts, MD;  Location: [facility] INVASIVE CV LAB;  Service: Cardiovascular;  Laterality: N/A;   TONSILLECTOMY       Social History:   reports that she has never smoked. She has never used smokeless tobacco. She reports that she does not drink alcohol and does not use drugs.   Family History:  Her family history includes Other in an other family member. There is no history of Colon cancer, Esophageal cancer, Rectal cancer, or Stomach cancer. She was adopted.   Allergies Allergies  Allergen  Reactions   Actos [Pioglitazone] Other (See Comments)    REACTION: pt states INTOL w/ edema   Codeine Other (See Comments)    REACTION: vomiting   Januvia [Sitagliptin] Nausea Only   Lactose Intolerance (Gi) Diarrhea   Morphine Nausea Only and Nausea And Vomiting    REACTION: vomiting REACTION: vomiting   Repatha [Evolocumab]     WAS NOT EFFECTIVE IN LOWERING LDL     Home Medications  Prior to Admission medications   Medication Sig Start Date End Date Taking? Authorizing Provider  allopurinol (ZYLOPRIM) 100 MG tablet Take 1 tablet by mouth daily as needed. 09/25/21   [provider]  ALPRAZolam Prudy Feeler) 0.5 MG tablet Take 0.5 mg by mouth 3 (three) times daily. 07/15/23   [provider]  azaTHIOprine (IMURAN) 50 MG tablet Take 1 tablet by mouth daily.    [provider]  clopidogrel (PLAVIX) 75 MG tablet TAKE 1 TABLET BY MOUTH EVERY DAY 02/01/23   Corky Crafts, MD  diltiazem [facility] CD) 240 MG 24 hr capsule TAKE 1 CAPSULE(240 MG) BY MOUTH DAILY 04/04/23   Corky Crafts, MD  Empagliflozin-metFORMIN HCl ER 25-1000 MG TB24 Take 1 tablet by mouth daily.    [provider]  GEMTESA 75 MG TABS Take 1 tablet by mouth daily. 12/31/21   [provider]  icosapent Ethyl (VASCEPA) 1 g capsule TAKE 2 CAPSULES(2 GRAMS) BY MOUTH TWICE DAILY 02/03/23   Corky Crafts, MD  losartan (COZAAR) 100 MG tablet TAKE 1 TABLET(100 MG) BY MOUTH DAILY 03/03/23   Corky Crafts, MD  methylPREDNISolone (MEDROL) 4 MG tablet Take 4 mg by mouth daily. 07/08/23   [provider]  metoprolol tartrate (LOPRESSOR) 50 MG tablet TAKE 1 TABLET(50 MG) BY MOUTH TWICE DAILY 04/04/23   Corky Crafts, MD  NEXLETOL 180 MG TABS TAKE 1 TABLET BY MOUTH DAILY 07/20/23   Corky Crafts, MD  nitroGLYCERIN (NITROSTAT) 0.4 MG SL tablet Place 1 tablet (0.4 mg total) under the tongue every 5 (five) minutes as needed for chest pain. 02/08/22   Corky Crafts, MD  [facility] VERIO test strip CHECK BLOOD SUGAR TID AS DIRECTED 10/04/19   [provider]  PRALUENT 150 MG/ML SOAJ INJECT 150 MG INTO THE SKIN EVERY 14 DAYS 08/06/22   Corky Crafts, MD  spironolactone (ALDACTONE) 25 MG tablet TAKE 1 TABLET BY MOUTH EVERY DAY 02/01/23   Corky Crafts, MD     Critical care time: 45 minutes      Champ Mungo, DO Internal Medicine PGY-3 08/05/2023

## 2023-08-05 NOTE — Progress Notes (Signed)
Pt experienced vomiting episode during SBT, despite TF being held. OGT attached to LIWS. Pt placed back on full support on the ventilator. O2 sats on the bedside monitor 100% with 40% FiO2 on the vent. Dr. August Saucer notified. Fentanyl drip resumed. Pt now calm with RASS -1

## 2023-08-05 NOTE — Plan of Care (Signed)
  Problem: Education: Goal: Knowledge of General Education information will improve Description: Including pain rating scale, medication(s)/side effects and non-pharmacologic comfort measures Outcome: Progressing   Problem: Clinical Measurements: Goal: Respiratory complications will improve Outcome: Progressing   Problem: Nutrition: Goal: Adequate nutrition will be maintained Outcome: Not Progressing   Problem: Elimination: Goal: Will not experience complications related to bowel motility Outcome: Not Progressing   Problem: Safety: Goal: Non-violent Restraint(s) Outcome: Completed/Met

## 2023-08-05 NOTE — Progress Notes (Signed)
BLE venous duplex has been completed.  Preliminary findings given to Dr. August Saucer.    Results can be found under chart review under CV PROC. 08/05/2023 4:28 PM Nicklas Mcsweeney RVT, RDMS

## 2023-08-05 NOTE — Consult Note (Addendum)
Advanced Heart Failure Team Consult Note   Primary Physician: Renaye Rakers, MD PCP-Cardiologist:  Lance Muss, MD  Reason for Consultation: RV Failure in of Acute PE   HPI:    Jocelyn [name] is seen today for evaluation of RV failure in setting of acute PE at the request of Dr. Katrinka Blazing, PCCM.   67 y/o female w/ h/o inferior STEMI on 06/15/2017. She received a drug-eluting stent to her distal right coronary artery. She had mild LAD disease. Echo then showed normal LVEF 50-55%, RV normal.    Seen recently in cardiology clinic and complained of fatigue and knee pain. Ordered to get NST. This was done 9/12 and showed no ischemia.   Presented to the ED on 9/18 w/ acute dyspnea. Hypoxic requiring 6L [place], hypertensive + lactic acidosis, LA 7.6. COVID negative. CT angio showed saddle pulmonary embolism as well as extensive bilateral emboli. Shortly after arrival to the ICU she developed PEA arrest/ obstructive shock. Achieved ROSC after ~5 min of CPR, Epi x 1 and TNK. Intubated post arrest. Started on NE. Initial POCUS showed LVEF ~30-35%, dilated RV w/ paradoxical septal motion and RV hypokinesis. Underwent mechanical/Aspiration thrombectomy of right and left main and lobar pulmonary arteries by IR.   Complete TTE done yesterday showed improved LVEF, 60-65%, + RV McConnell's sign. RV dilated mildly reduced systolic fx.   Extubated today. On 2L [place]   On Milrinone 0.25 + NE 7. Off VP. CVP 13. Co-ox 69%. LA cleared > 9.0 => 1.1    On heparin gtt. LE venous dopplers ordered   Feeling better. Daughter at bedside.   Echo 08/04/23 1. Left ventricular ejection fraction, by estimation, is 60 to 65%. The  left ventricle has normal function. The left ventricle has no regional  wall motion abnormalities. There is mild concentric left ventricular  hypertrophy. Left ventricular diastolic  parameters are indeterminate.   2. RVOT is dilated; RV appears dilated in short axis views. RV  McConnell's  Sign; this can be seen in RV strain; it is best appreciated  with contrast imaging. Right ventricular systolic function is mildly  reduced. The right ventricular size is dilated.  There is normal pulmonary artery systolic pressure.   3. The mitral valve is grossly normal. No evidence of mitral valve  regurgitation. No evidence of mitral stenosis.   4. The aortic valve was not well visualized. Aortic valve regurgitation  is not visualized.    Review of Systems: [y] = yes, [ ]  = no   General: Weight gain [ ] ; Weight loss [ ] ; Anorexia [ ] ; Fatigue [ ] ; Fever [ ] ; Chills [ ] ; Weakness [ ]   Cardiac: Chest pain/pressure [ ] ; Resting SOB [ ] ; Exertional SOB [ ] ; Orthopnea [ ] ; Pedal Edema [ ] ; Palpitations [ ] ; Syncope [ ] ; Presyncope [ ] ; Paroxysmal nocturnal dyspnea[ ]   Pulmonary: Cough [ ] ; Wheezing[ ] ; Hemoptysis[ ] ; Sputum [ ] ; Snoring [ ]   GI: Vomiting[ ] ; Dysphagia[ ] ; Melena[ ] ; Hematochezia [ ] ; Heartburn[ ] ; Abdominal pain [ ] ; Constipation [ ] ; Diarrhea [ ] ; BRBPR [ ]   GU: Hematuria[ ] ; Dysuria [ ] ; Nocturia[ ]   Vascular: Pain in legs with walking [ ] ; Pain in feet with lying flat [ ] ; Non-healing sores [ ] ; Stroke [ ] ; TIA [ ] ; Slurred speech [ ] ;  Neuro: Headaches[ ] ; Vertigo[ ] ; Seizures[ ] ; Paresthesias[ ] ;Blurred vision [ ] ; Diplopia [ ] ; Vision changes [ ]   Ortho/Skin: Arthritis [ ] ; Joint pain [ ] ;  Muscle pain [ ] ; Joint swelling [ ] ; Back Pain [ ] ; Rash [ ]   Psych: Depression[ ] ; Anxiety[ ]   Heme: Bleeding problems [ ] ; Clotting disorders [ ] ; Anemia [ ]   Endocrine: Diabetes [ ] ; Thyroid dysfunction[ ]   Home Medications Prior to Admission medications   Medication Sig Start Date End Date Taking? Authorizing Provider  azaTHIOprine (IMURAN) 50 MG tablet Take 1 tablet by mouth daily.   Yes [provider]  clopidogrel (PLAVIX) 75 MG tablet TAKE 1 TABLET BY MOUTH EVERY DAY 02/01/23  Yes Corky Crafts, MD  Empagliflozin-metFORMIN HCl ER 25-1000 MG TB24 Take 1 tablet  by mouth daily.   Yes [provider]  losartan (COZAAR) 100 MG tablet TAKE 1 TABLET(100 MG) BY MOUTH DAILY 03/03/23  Yes Corky Crafts, MD  NEXLETOL 180 MG TABS TAKE 1 TABLET BY MOUTH DAILY 07/20/23  Yes Corky Crafts, MD  nitroGLYCERIN (NITROSTAT) 0.4 MG SL tablet Place 1 tablet (0.4 mg total) under the tongue every 5 (five) minutes as needed for chest pain. 02/08/22  Yes Corky Crafts, MD  spironolactone (ALDACTONE) 25 MG tablet TAKE 1 TABLET BY MOUTH EVERY DAY 02/01/23  Yes Corky Crafts, MD  allopurinol (ZYLOPRIM) 100 MG tablet Take 1 tablet by mouth daily as needed. 09/25/21   [provider]  ALPRAZolam Prudy Feeler) 0.5 MG tablet Take 0.5 mg by mouth 3 (three) times daily. 07/15/23   [provider]  diltiazem (CARDIZEM CD) 240 MG 24 hr capsule TAKE 1 CAPSULE(240 MG) BY MOUTH DAILY 04/04/23   Corky Crafts, MD  GEMTESA 75 MG TABS Take 1 tablet by mouth daily. 12/31/21   [provider]  icosapent Ethyl (VASCEPA) 1 g capsule TAKE 2 CAPSULES(2 GRAMS) BY MOUTH TWICE DAILY 02/03/23   Corky Crafts, MD  methylPREDNISolone (MEDROL) 4 MG tablet Take 4 mg by mouth daily. 07/08/23   [provider]  metoprolol tartrate (LOPRESSOR) 50 MG tablet TAKE 1 TABLET(50 MG) BY MOUTH TWICE DAILY 04/04/23   Corky Crafts, MD  [facility] VERIO test strip CHECK BLOOD SUGAR TID AS DIRECTED 10/04/19   [provider]  PRALUENT 150 MG/ML SOAJ INJECT 150 MG INTO THE SKIN EVERY 14 DAYS 08/06/22   Corky Crafts, MD    Past Medical History: Past Medical History:  Diagnosis Date   Acute bronchitis    Allergic rhinitis, cause unspecified    Anemia    Anxiety    B12 deficiency    BV (bacterial vaginosis) 06/22/1996   Calculus of kidney    Calculus of kidney    Coronary atherosclerosis of unspecified type of vessel, native or graft    Dizziness    Essential hypertension, benign    Fibroid 2003   Fibromyalgia    H/O  dysmenorrhea 2008   H/O varicella    Headache(784.0)    frequently   HSV-2 infection 2009   Hyperplastic colon polyp 05/16/2014   Irritable bowel syndrome    Meniere's disease, unspecified    Menses, irregular 2003   Myalgia and myositis, unspecified    Myocardial infarction (HCC)    Obstructive sleep apnea (adult) (pediatric)    Perimenopausal symptoms 2003   Pure hypercholesterolemia    Sarcoidosis    Type II or unspecified type diabetes mellitus without mention of complication, not stated as uncontrolled    Unspecified venous (peripheral) insufficiency    Vitamin D deficiency    Vulvitis 2010   Yeast infection     Past  Surgical History: Past Surgical History:  Procedure Laterality Date   COLONOSCOPY     CORONARY STENT INTERVENTION N/A 06/15/2017   Procedure: CORONARY STENT INTERVENTION;  Surgeon: Corky Crafts, MD;  Location: [facility] INVASIVE CV LAB;  Service: Cardiovascular;  Laterality: N/A;   heart catherization     IR ANGIOGRAM PULMONARY BILATERAL SELECTIVE  08/04/2023   IR ANGIOGRAM SELECTIVE EACH ADDITIONAL VESSEL  08/04/2023   IR ANGIOGRAM SELECTIVE EACH ADDITIONAL VESSEL  08/04/2023   IR THROMBECT PRIM MECH INIT (INCLU) MOD SED  08/04/2023   IR US GUIDE VASC ACCESS RIGHT  08/04/2023   LEFT HEART CATH AND CORONARY ANGIOGRAPHY N/A 06/15/2017   Procedure: LEFT HEART CATH AND CORONARY ANGIOGRAPHY;  Surgeon: Corky Crafts, MD;  Location: [facility] INVASIVE CV LAB;  Service: Cardiovascular;  Laterality: N/A;   RADIOLOGY WITH ANESTHESIA N/A 08/04/2023   Procedure: IR WITH ANESTHESIA;  Surgeon: Julieanne Cotton, MD;  Location: [facility] OR;  Service: Radiology;  Laterality: N/A;   TONSILLECTOMY      Family History: Family History  Adopted: Yes  Problem Relation Age of Onset   Other Other        ADOPTED   Colon cancer Neg Hx    Esophageal cancer Neg Hx    Rectal cancer Neg Hx    Stomach cancer Neg Hx     Social History: Social History   Socioeconomic History   Marital  status: Single    Spouse name: Not on file   Number of children: 1   Years of education: Not on file   Highest education level: Not on file  Occupational History   Occupation: Retired Runner, broadcasting/film/video   Tobacco Use   Smoking status: Never   Smokeless tobacco: Never   Tobacco comments:    Daily Caffeine - 1  Exercise 2-3 times/weekly  Vaping Use   Vaping status: Never Used  Substance and Sexual Activity   Alcohol use: No    Alcohol/week: 0.0 standard drinks of alcohol   Drug use: No   Sexual activity: Yes    Birth control/protection: None  Other Topics Concern   Not on file  Social History Narrative   Exercises 2-3 times weekly   Caffeine Use: 1 daily (tea or Pepsi)   Lives alone in a one story home.  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     Social Determinants of Health   Financial Resource Strain: Not on file  Food Insecurity: Not on file  Transportation Needs: Not on file  Physical Activity: Not on file  Stress: Not on file  Social Connections: Not on file    Allergies:  Allergies  Allergen Reactions   Actos [Pioglitazone] Other (See Comments)    REACTION: pt states INTOL w/ edema   Codeine Other (See Comments)    REACTION: vomiting   Januvia [Sitagliptin] Nausea Only   Lactose Intolerance (Gi) Diarrhea   Morphine Nausea Only and Nausea And Vomiting    REACTION: vomiting REACTION: vomiting   Repatha [Evolocumab]     WAS NOT EFFECTIVE IN LOWERING LDL    Objective:    Vital Signs:   Temp:  [98.2 F (36.8 C)-100.6 F (38.1 C)] 98.8 F (37.1 C) (09/20 1145) Pulse Rate:  [62-118] 90 (09/20 1145) Resp:  [15-30] 24 (09/20 1145) BP: (67-177)/(35-165) 125/89 (09/20 1145) SpO2:  [94 %-100 %] 99 % (09/20 1145) FiO2 (%):  [40 %-50 %] 40 % (09/20 0744) Weight:  [77.2 kg] 77.2 kg (09/20 0425) Last BM Date :  (PTA)  Weight change: Filed Weights   08/03/23 1937 08/05/23 0425  Weight: 68.9 kg 77.2 kg    Intake/Output:   Intake/Output Summary (Last 24 hours) at  08/05/2023 1235 Last data filed at 08/05/2023 1157 Gross per 24 hour  Intake 4088.77 ml  Output 675 ml  Net 3413.77 ml      Physical Exam    CVP 13  General:  extubated, fatigued appearing. No resp difficulty HEENT: normal Neck: supple. JVP 13 cm. Lt internal jugular CVC Carotids 2+ bilat; no bruits. No lymphadenopathy or thyromegaly appreciated. Cor: PMI nondisplaced. Regular rate & rhythm. No rubs, gallops or murmurs. Lungs: clear Abdomen: soft, nontender, nondistended. No hepatosplenomegaly. No bruits or masses. Good bowel sounds. Extremities: no cyanosis, clubbing, rash, edema Neuro: alert & orientedx3, cranial nerves grossly intact. moves all 4 extremities w/o difficulty. Affect pleasant   Telemetry   Sinus tach low 100s   EKG    Sinus tach low 100s   Labs   Basic Metabolic Panel: Recent Labs  Lab 08/03/23 1954 08/04/23 0032 08/04/23 0249 08/04/23 0522 08/04/23 0843 08/04/23 1540 08/04/23 1615 08/05/23 0424 08/05/23 0756  NA 140   < > 141 137 137 136  --  135 132*  K 5.2*   < > 5.2* 4.8 4.4 4.1  --  3.4* 3.6  CL 105  --  108  --   --   --   --  97*  --   CO2 12*  --  9*  --   --   --   --  24  --   GLUCOSE 101*  --  212*  --   --   --   --  205*  --   BUN 34*  --  31*  --   --   --   --  34*  --   CREATININE 1.73*  --  1.82*  --   --   --   --  1.69*  --   CALCIUM 9.6  --  8.2*  --   --   --   --  7.7*  --   MG  --   --  1.7  --   --   --  1.1* 1.1*  --   PHOS  --   --  7.5*  --   --   --  4.1 3.9  --    < > = values in this interval not displayed.    Liver Function Tests: Recent Labs  Lab 08/05/23 0424  AST 503*  ALT 208*  ALKPHOS 51  BILITOT 0.9  PROT 4.5*  ALBUMIN 2.4*   No results for input(s): "LIPASE", "AMYLASE" in the last 168 hours. No results for input(s): "AMMONIA" in the last 168 hours.  CBC: Recent Labs  Lab 08/03/23 1954 08/03/23 2359 08/04/23 0032 08/04/23 0249 08/04/23 0522 08/04/23 0843 08/04/23 1540 08/05/23 0601  08/05/23 0756  WBC 6.7 8.3  --  12.7*  --   --   --  9.2  --   NEUTROABS 4.8  --   --   --   --   --   --   --   --   HGB 11.7* 9.4*   < > 10.9* 9.9* 9.5* 8.5* 7.2* 7.5*  HCT 35.9* 31.0*   < > 32.9* 29.0* 28.0* 25.0* 21.1* 22.0*  MCV 108.5* 115.2*  --  111.9*  --   --   --  101.9*  --   PLT 220 146*  --  151  --   --   --  166  --    < > = values in this interval not displayed.    Cardiac Enzymes: No results for input(s): "CKTOTAL", "CKMB", "CKMBINDEX", "TROPONINI" in the last 168 hours.  BNP: BNP (last 3 results) No results for input(s): "BNP" in the last 8760 hours.  ProBNP (last 3 results) No results for input(s): "PROBNP" in the last 8760 hours.   CBG: Recent Labs  Lab 08/04/23 1929 08/04/23 2325 08/05/23 0318 08/05/23 0726 08/05/23 1111  GLUCAP 183* 188* 181* 209* 193*    Coagulation Studies: Recent Labs    08/03/23 2359  LABPROT 23.6*  INR 2.1*     Imaging   ECHOCARDIOGRAM COMPLETE  Result Date: 08/04/2023    ECHOCARDIOGRAM REPORT   Patient Name:   CONSWELLO PEVEY Date of Exam: 08/04/2023 Medical Rec #:  [id number]      Height:       62.0 in Accession #:    [id number]     Weight:       151.9 lb Date of Birth:  March 08, 1956     BSA:          1.701 m Patient Age:    66 years       BP:           93/73 mmHg Patient Gender: F              HR:           86 bpm. Exam Location:  Inpatient Procedure: 2D Echo, Cardiac Doppler, Color Doppler and Intracardiac            Opacification Agent Indications:    Pulmonary Embolus I26.09  History:        Patient has prior history of Echocardiogram examinations, most                 recent 06/16/2017. CAD, Signs/Symptoms:Dyspnea; Risk                 Factors:Hypertension, Diabetes and Dyslipidemia.  Sonographer:    Lucendia Herrlich RCS Referring Phys: [phone] Clarene Critchley [facility]  Sonographer Comments: Echo performed with patient supine and on artificial respirator. IMPRESSIONS  1. Left ventricular ejection fraction, by estimation, is 60 to 65%. The  left ventricle has normal function. The left ventricle has no regional wall motion abnormalities. There is mild concentric left ventricular hypertrophy. Left ventricular diastolic parameters are indeterminate.  2. RVOT is dilated; RV appears dilated in short axis views. RV McConnell's Sign; this can be seen in RV strain; it is best appreciated with contrast imaging. Right ventricular systolic function is mildly reduced. The right ventricular size is dilated. There is normal pulmonary artery systolic pressure.  3. The mitral valve is grossly normal. No evidence of mitral valve regurgitation. No evidence of mitral stenosis.  4. The aortic valve was not well visualized. Aortic valve regurgitation is not visualized. Comparison(s): Prior images reviewed side by side. RV strain and RV dilation are new from 2018; mitral regurgitation is improved. FINDINGS  Left Ventricle: Left ventricular ejection fraction, by estimation, is 60 to 65%. The left ventricle has normal function. The left ventricle has no regional wall motion abnormalities. Definity contrast agent was given IV to delineate the left ventricular  endocardial borders. The left ventricular internal cavity size was small. There is mild concentric left ventricular hypertrophy. Left ventricular diastolic parameters are indeterminate. Right Ventricle: RVOT is dilated; RV appears dilated in short axis views. RV  McConnell's Sign; this can be seen in RV strain; it is best appreciated with contrast imaging. The right ventricular size is dilated. No increase in right ventricular wall thickness. Right ventricular systolic function is mildly reduced. There is normal pulmonary artery systolic pressure. The tricuspid regurgitant velocity is 2.51 m/s, and with an assumed right atrial pressure of 8 mmHg, the estimated right ventricular systolic pressure is 33.2 mmHg. Left Atrium: Left atrial size was normal in size. Right Atrium: Right atrial size was normal in size. Pericardium:  There is no evidence of pericardial effusion. Mitral Valve: The mitral valve is grossly normal. No evidence of mitral valve regurgitation. No evidence of mitral valve stenosis. Tricuspid Valve: The tricuspid valve is normal in structure. Tricuspid valve regurgitation is trivial. No evidence of tricuspid stenosis. Aortic Valve: The aortic valve was not well visualized. Aortic valve regurgitation is not visualized. Aortic valve peak gradient measures 6.5 mmHg. Pulmonic Valve: The pulmonic valve was normal in structure. Pulmonic valve regurgitation is trivial. No evidence of pulmonic stenosis. Aorta: The aortic root and ascending aorta are structurally normal, with no evidence of dilitation. IAS/Shunts: The atrial septum is grossly normal.  LEFT VENTRICLE PLAX 2D LVIDd:         3.20 cm LVIDs:         2.20 cm LV PW:         1.25 cm LV IVS:        1.10 cm LVOT diam:     1.90 cm LV SV:         36 LV SV Index:   21 LVOT Area:     2.84 cm  RIGHT VENTRICLE            IVC RV S prime:     9.30 cm/s  IVC diam: 2.00 cm TAPSE (M-mode): 1.3 cm LEFT ATRIUM             Index        RIGHT ATRIUM           Index LA diam:        2.85 cm 1.68 cm/m   RA Area:     12.00 cm LA Vol (A2C):   36.1 ml 21.23 ml/m  RA Volume:   23.30 ml  13.70 ml/m LA Vol (A4C):   18.8 ml 11.02 ml/m LA Biplane Vol: 26.0 ml 15.29 ml/m  AORTIC VALVE AV Area (Vmax): 1.96 cm AV Vmax:        127.00 cm/s AV Peak Grad:   6.5 mmHg LVOT Vmax:      87.97 cm/s LVOT Vmean:     55.467 cm/s LVOT VTI:       0.127 m  AORTA Ao Root diam: 3.00 cm Ao Asc diam:  3.10 cm MITRAL VALVE               TRICUSPID VALVE MV Area (PHT): 2.69 cm    TR Peak grad:   25.2 mmHg MV Decel Time: 282 msec    TR Vmax:        251.00 cm/s MV E velocity: 50.60 cm/s MV A velocity: 65.20 cm/s  SHUNTS MV E/A ratio:  0.78        Systemic VTI:  0.13 m                            Systemic Diam: 1.90 cm Riley Lam MD Electronically signed by Riley Lam MD Signature Date/Time:  08/04/2023/2:51:59 PM  Final    EEG adult  Result Date: 08/04/2023 Charlsie Quest, MD     08/04/2023  1:51 PM Patient Name: Jocelyn [name] MRN: [id number] Epilepsy Attending: Charlsie Quest Referring Physician/Provider: Rejeana Brock, MD Date: 08/04/2023 Duration: 25.20 mins Patient history: 67yo F s/p PEA arrest getting eeg to evaluate for seizure Level of alertness: Awake AEDs during EEG study: versed Technical aspects: This EEG study was done with scalp electrodes positioned according to the 10-20 International system of electrode placement. Electrical activity was reviewed with band pass filter of 1-70Hz , sensitivity of 7 uV/mm, display speed of 70mm/sec with a 60Hz  notched filter applied as appropriate. EEG data were recorded continuously and digitally stored.  Video monitoring was available and reviewed as appropriate. Description: EEG showed continuous generalized 3 to 6 Hz theta-delta slowing. Hyperventilation and photic stimulation were not performed.   ABNORMALITY - Continuous slow, generalized IMPRESSION: This study is suggestive of moderate diffuse encephalopathy. No seizures or epileptiform discharges were seen throughout the recording. Priyanka Annabelle Harman     Medications:     Current Medications:  Chlorhexidine Gluconate Cloth  6 each Topical Daily   docusate  100 mg Per Tube BID   famotidine  20 mg Per Tube Daily   feeding supplement (PROSource TF20)  60 mL Per Tube Daily   fentaNYL (SUBLIMAZE) injection  25 mcg Intravenous Once   insulin aspart  0-20 Units Subcutaneous Q4H   insulin detemir  8 Units Subcutaneous BID   mouth rinse  15 mL Mouth Rinse Q2H   polyethylene glycol  17 g Per Tube Daily    Infusions:  sodium chloride     sodium chloride Stopped (08/05/23 1156)   feeding supplement (VITAL 1.5 CAL) 20 mL/hr at 08/05/23 1157   fentaNYL infusion INTRAVENOUS Stopped (08/05/23 0958)   heparin 700 Units/hr (08/05/23 1157)   milrinone 0.25 mcg/kg/min (08/05/23  1157)   norepinephrine (LEVOPHED) Adult infusion 7 mcg/min (08/05/23 1157)   potassium chloride 50 mL/hr at 08/05/23 1157   vasopressin 0.03 Units/min (08/05/23 1157)      Patient Profile   67 y/o female w/ h/o CAD s/p inferior STEMI in 2018 treated w/ PCI to distal RCA, admitted w/ acute hypoxic respiratory failure due to large PE, progressed to obstructive shock, PEA arrest and subsequent RV failure.   Assessment/Plan   1. Acute Hypoxic Respiratory Failure/ Saddle pulmonary embolism as well as extensive bilateral emboli - s/p PEA arrest, s/p TNK and mechanical/Aspiration thrombectomy of right and left main and lobar pulmonary arteries by IR.  - now extubated, on 2 L [place] - LE venous dopplers ordered  2. Acute RV Failure/ Obstructive Shock  - RV strain 2/2 large PE  - initial POCUS showed LVEF ~30-35%, dilated RV w/ paradoxical septal motion and RV hypokinesis. Underwent mechanical/Aspiration thrombectomy of right and left main and lobar pulmonary arteries by IR. - Compete TTE post thrombectomy EF 60-65%, RV dilated w/ mildly reduced systolic fx  - on Milrinone 0.25 + NE 2. Off VP. Lactic Acidosis resolved > 9.0 on admit>>1.1 today. Co-ox 69%  - CVP 12. No diuresis yet    - wean inotrope's/ pressors as tolerated. Would wean NE first. Continue milrinone for PV support and lowering of PA pressures  - can consider digoxin later if renal fx stabilizes   3. AKI  - SCr 1.73 on admit, peaked 1.8 (last known b/l was in 2022, normal 0.87) - likely 2/2 shock  - SCr 1.6 today, oliguric  -  monitor UOP/ SCr closely - continue inotropic/ RV support w/ milrinone   - NE to keep MAP > 65   Length of Stay: 2  Robbie Lis, PA-C  08/05/2023, 12:35 PM  Advanced Heart Failure Team Pager [phone] (M-F; 7a - 5p)  Please contact [facility] for night-coverage after hours (4p -7a ) and weekends on [email or web address]   Patient seen with PA, agree with the above note.   Patient has history as noted  above.  Presented with PE/saddle embolus with PEA arrest in ER, treated with thrombolytics then thrombectomy by IR.  Initially required milrinone, NE, and vasopressin for RV failure.  Lactate > 9, has cleared to 1.1.  Echo 9/19 showed EF 60-65%, mild RV dysfunction and mild RV dilation with McConnell's sign.  She is currently on milrinone 0.25, NE down to 7.  CVP 7 on my read.  Co-ox 69%.   General: NAD Neck: JVP 8 cm, no thyromegaly or thyroid nodule.  Lungs: Decreased at bases.  CV: Nondisplaced PMI.  Heart regular S1/S2, no S3/S4, no murmur.  Trace ankle edema.  No carotid bruit.  Normal pedal pulses.  Abdomen: Soft, nontender, no hepatosplenomegaly, no distention.  Skin: Intact without lesions or rashes.  Neurologic: Alert and oriented x 3.  Psych: Normal affect. Extremities: No clubbing or cyanosis.  HEENT: Normal.   1. PE: Large, hemodynamically significant saddle embolus. Had PEA arrest. Had thrombolytics then mechanical aspiration by IR.  Improving hemodynamically.  Trigger for PE uncertain, ambulation limited by knee pain but not sedentary.  No long trips, no known cancer, etc.  Does not know family history (adopted).  - Heparin gtt for now, transition to Eliquis eventually.  - Hypercoagulable workup, will send the Factor V Leiden, prothrombin gene mutation, and lupus anticoagulant panel now.  2. Shock: RV failure with shock from large saddle embolus.  S/p thrombectomy and started on pressors/inotropes for RV shock.  Now off vasopressin and trending down on NE to 7.  On milrinone 0.25.  Co-ox 69%, lactate has cleared, and CVP 7 on my read.  - Wean down NE first.  - Will come off milrinone eventually.  - Follow CVP, may eventually need diuresis but would keep CVP at least 10 with RV failure.  3. AKI: Creatinine now trending down, 1.69 today.  Suspect rise due to shock.  4. Elevated LFTs: Suspect shock liver.  Follow.  5. CAD: H/o inferior MI in 8/18 with DES to RCA.  Cardiolite in 9/24  showed no ischemia/infarction.  - She has been on Praluent at home for lipids, resume at discharge.  - She was on Plavix at home, will stop this as she will be discharged this admission on DOAC.   Marca Ancona 08/05/2023 2:20 PM

## 2023-08-05 NOTE — TOC Initial Note (Signed)
Transition of Care [facility]) - Initial/Assessment Note    Patient Details  Name: Jocelyn [name] MRN: [id number] Date of Birth: Aug 15, 1956  Transition of Care [facility]) CM/SW Contact:    Nicanor Bake Phone Number: [phone] 08/05/2023, 4:19 PM  Clinical Narrative:   HF CSW met with pt and her family (daughter, son in law, cousin, and pastor) at bedside. CSW built rapport with family. Pt is retired and lives at home alone. Pt stated that she has no history of any HH services. Pt stated that she does not use any equipment. Pt did inquire about a shower chair. Pt has a scale at home. CSW explained that a follow up hospital appointment will be scheduled for a later time closer to dc. Pt and daughter agree.   TOC will continue following.                Expected Discharge Plan: Home/Self Care Barriers to Discharge: Continued Medical Work up   Patient Goals and CMS Choice            Expected Discharge Plan and Services       Living arrangements for the past 2 months: Single Family Home                                      Prior Living Arrangements/Services Living arrangements for the past 2 months: Single Family Home Lives with:: Self Patient language and need for interpreter reviewed:: Yes Do you feel safe going back to the place where you live?: Yes      Need for Family Participation in Patient Care: No (Comment) Care giver support system in place?: Yes (comment)   Criminal Activity/Legal Involvement Pertinent to Current Situation/Hospitalization: No - Comment as needed  Activities of Daily Living      Permission Sought/Granted                  Emotional Assessment Appearance:: Appears older than stated age Attitude/Demeanor/Rapport: Engaged Affect (typically observed): Appropriate Orientation: : Oriented to Self, Oriented to Place, Oriented to  Time, Oriented to Situation Alcohol / Substance Use: Not Applicable Psych Involvement: No  (comment)  Admission diagnosis:  Pulmonary embolism (HCC) [I26.99] Pulmonary edema [J81.1] Patient Active Problem List   Diagnosis Date Noted   Acute respiratory failure with hypoxia (HCC) 08/05/2023   Pulmonary embolism (HCC) 08/03/2023   Pulmonary edema 08/03/2023   Degenerative arthritis of knee 08/20/2021   Hx of pleural effusion    Dyspnea 08/16/2018   Fall on or from sidewalk curb, initial encounter 05/31/2018   History of acute inferior wall MI 08/22/2017   Diabetes mellitus (HCC)    S/P right coronary artery (RCA) stent placement    Overweight 05/05/2017   Diabetes mellitus type 2, controlled, without complications (HCC) 12/16/2016   Asthmatic bronchitis , chronic 05/10/2016   Polymyositis (HCC) 09/15/2015   History of sarcoidosis 09/15/2015   Falling 03/13/2015   Edema 02/13/2014   Coronary artery disease involving native heart without angina pectoris 10/29/2013   Hyperlipidemia 10/29/2013   Iron (Fe) deficiency anemia 08/08/2013   Vitamin B 12 deficiency 08/08/2013   GERD (gastroesophageal reflux disease) 09/08/2011   Weakness 02/22/2011   Depression 02/22/2011   MENIERE'S DISEASE 04/30/2009   Vitamin D deficiency 02/05/2009   Venous (peripheral) insufficiency 12/12/2007   RENAL CALCULUS 12/12/2007   DIZZINESS 12/12/2007   HYPERCHOLESTEROLEMIA 09/16/2007   Anxiety state 09/16/2007  HYPERTENSION, BENIGN 09/16/2007   Allergic rhinitis 09/16/2007   IRRITABLE BOWEL SYNDROME 09/16/2007   Myalgia and myositis 09/16/2007   PCP:  Renaye Rakers, MD Pharmacy:   [facility] [phone] - [place], [place] - [address] AT [facility] OF [address] [place] [address] Phone: [phone] Fax: [phone]  [id number] [place], [place] - [address] AT [facility] OF [address] & [address] [facility] [address] Phone: [phone] Fax: [phone]     Social Determinants  of Health (SDOH) Social History: SDOH Screenings   Food Insecurity: No Food Insecurity (08/05/2023)  Transportation Needs: No Transportation Needs (08/05/2023)  Utilities: Not At Risk (08/05/2023)  Alcohol Screen: Low Risk  (08/05/2023)  Depression (PHQ2-9): Low Risk  (09/03/2020)  Financial Resource Strain: Low Risk  (08/05/2023)  Stress: No Stress Concern Present (08/05/2023)  Tobacco Use: Low Risk  (08/03/2023)   SDOH Interventions:     Readmission Risk Interventions     No data to display

## 2023-08-06 ENCOUNTER — Inpatient Hospital Stay (HOSPITAL_COMMUNITY): Payer: Medicare PPO

## 2023-08-06 DIAGNOSIS — I2699 Other pulmonary embolism without acute cor pulmonale: Secondary | ICD-10-CM | POA: Diagnosis not present

## 2023-08-06 DIAGNOSIS — D329 Benign neoplasm of meninges, unspecified: Secondary | ICD-10-CM

## 2023-08-06 DIAGNOSIS — I2602 Saddle embolus of pulmonary artery with acute cor pulmonale: Secondary | ICD-10-CM | POA: Diagnosis not present

## 2023-08-06 LAB — CBC
HCT: 14.5 % — ABNORMAL LOW (ref 36.0–46.0)
HCT: 23.2 % — ABNORMAL LOW (ref 36.0–46.0)
Hemoglobin: 4.9 g/dL — CL (ref 12.0–15.0)
Hemoglobin: 8.2 g/dL — ABNORMAL LOW (ref 12.0–15.0)
MCH: 35.8 pg — ABNORMAL HIGH (ref 26.0–34.0)
MCH: 34.3 pg — ABNORMAL HIGH (ref 26.0–34.0)
MCHC: 33.8 g/dL (ref 30.0–36.0)
MCHC: 35.3 g/dL (ref 30.0–36.0)
MCV: 105.8 fL — ABNORMAL HIGH (ref 80.0–100.0)
MCV: 97.1 fL (ref 80.0–100.0)
Platelets: 100 10*3/uL — ABNORMAL LOW (ref 150–400)
Platelets: 128 10*3/uL — ABNORMAL LOW (ref 150–400)
RBC: 1.37 MIL/uL — ABNORMAL LOW (ref 3.87–5.11)
RBC: 2.39 MIL/uL — ABNORMAL LOW (ref 3.87–5.11)
RDW: 15.2 % (ref 11.5–15.5)
RDW: 16.5 % — ABNORMAL HIGH (ref 11.5–15.5)
WBC: 6.2 10*3/uL (ref 4.0–10.5)
WBC: 7.2 10*3/uL (ref 4.0–10.5)
nRBC: 8.9 % — ABNORMAL HIGH (ref 0.0–0.2)
nRBC: 10 % — ABNORMAL HIGH (ref 0.0–0.2)

## 2023-08-06 LAB — GLUCOSE, CAPILLARY
Glucose-Capillary: 185 mg/dL — ABNORMAL HIGH (ref 70–99)
Glucose-Capillary: 174 mg/dL — ABNORMAL HIGH (ref 70–99)
Glucose-Capillary: 164 mg/dL — ABNORMAL HIGH (ref 70–99)
Glucose-Capillary: 143 mg/dL — ABNORMAL HIGH (ref 70–99)
Glucose-Capillary: 119 mg/dL — ABNORMAL HIGH (ref 70–99)
Glucose-Capillary: 204 mg/dL — ABNORMAL HIGH (ref 70–99)
Glucose-Capillary: 147 mg/dL — ABNORMAL HIGH (ref 70–99)
Glucose-Capillary: 112 mg/dL — ABNORMAL HIGH (ref 70–99)

## 2023-08-06 LAB — PHOSPHORUS: Phosphorus: 3.2 mg/dL (ref 2.5–4.6)

## 2023-08-06 LAB — HEPATIC FUNCTION PANEL
ALT: 116 U/L — ABNORMAL HIGH (ref 0–44)
AST: 113 U/L — ABNORMAL HIGH (ref 15–41)
Albumin: 2.6 g/dL — ABNORMAL LOW (ref 3.5–5.0)
Alkaline Phosphatase: 51 U/L (ref 38–126)
Bilirubin, Direct: 0.3 mg/dL — ABNORMAL HIGH (ref 0.0–0.2)
Indirect Bilirubin: 0.8 mg/dL (ref 0.3–0.9)
Total Bilirubin: 1.1 mg/dL (ref 0.3–1.2)
Total Protein: 5.1 g/dL — ABNORMAL LOW (ref 6.5–8.1)

## 2023-08-06 LAB — BASIC METABOLIC PANEL
Anion gap: 11 (ref 5–15)
BUN: 33 mg/dL — ABNORMAL HIGH (ref 8–23)
CO2: 24 mmol/L (ref 22–32)
Calcium: 7.6 mg/dL — ABNORMAL LOW (ref 8.9–10.3)
Chloride: 99 mmol/L (ref 98–111)
Creatinine, Ser: 1.58 mg/dL — ABNORMAL HIGH (ref 0.44–1.00)
GFR, Estimated: 35 mL/min — ABNORMAL LOW (ref >60)
Glucose, Bld: 169 mg/dL — ABNORMAL HIGH (ref 70–99)
Potassium: 3.5 mmol/L (ref 3.5–5.1)
Sodium: 134 mmol/L — ABNORMAL LOW (ref 135–145)

## 2023-08-06 LAB — HEMOGLOBIN AND HEMATOCRIT, BLOOD
HCT: 19.5 % — ABNORMAL LOW (ref 36.0–46.0)
Hemoglobin: 6.8 g/dL — CL (ref 12.0–15.0)

## 2023-08-06 LAB — CARDIOLIPIN ANTIBODIES, IGM+IGG
Anticardiolipin IgG: <9 GPL U/mL (ref 0–14)
Anticardiolipin IgM: <9 MPL U/mL (ref 0–12)

## 2023-08-06 LAB — PREPARE RBC (CROSSMATCH)

## 2023-08-06 LAB — COOXEMETRY PANEL
Carboxyhemoglobin: 1.2 % (ref 0.5–1.5)
Methemoglobin: 1.5 % (ref 0.0–1.5)
O2 Saturation: 70.6 %
Total hemoglobin: 7 g/dL — ABNORMAL LOW (ref 12.0–16.0)

## 2023-08-06 LAB — MAGNESIUM: Magnesium: 2.7 mg/dL — ABNORMAL HIGH (ref 1.7–2.4)

## 2023-08-06 LAB — HEPARIN LEVEL (UNFRACTIONATED)
Heparin Unfractionated: 0.51 IU/mL (ref 0.30–0.70)
Heparin Unfractionated: 0.19 IU/mL — ABNORMAL LOW (ref 0.30–0.70)

## 2023-08-06 MED ORDER — SODIUM CHLORIDE 0.9% IV SOLUTION: Freq: Once | INTRAVENOUS | Status: AC

## 2023-08-06 MED ORDER — POLYETHYLENE GLYCOL 3350 17 G PO PACK
17.0000 g | PACK | Freq: Every day | ORAL | Status: DC
  Administered 2023-08-07 – 2023-08-08 (×2): 17 g via ORAL
  Filled 2023-08-06 (×4): qty 1

## 2023-08-06 MED ORDER — AZATHIOPRINE 50 MG PO TABS
100.0000 mg | ORAL_TABLET | Freq: Every day | ORAL | Status: DC
  Administered 2023-08-06 – 2023-08-11 (×6): 100 mg via ORAL
  Filled 2023-08-06 (×6): qty 2

## 2023-08-06 MED ORDER — EPINEPHRINE 1 MG/10ML IJ SOSY
PREFILLED_SYRINGE | INTRAMUSCULAR | Status: AC
  Filled 2023-08-06: qty 10

## 2023-08-06 MED ORDER — ASPIRIN 81 MG PO TBEC
81.0000 mg | DELAYED_RELEASE_TABLET | Freq: Every day | ORAL | Status: DC
  Administered 2023-08-06 – 2023-08-11 (×6): 81 mg via ORAL
  Filled 2023-08-06 (×6): qty 1

## 2023-08-06 MED ORDER — ACETAMINOPHEN 325 MG PO TABS
650.0000 mg | ORAL_TABLET | Freq: Once | ORAL | Status: AC | PRN
  Administered 2023-08-06: 650 mg via ORAL
  Filled 2023-08-06: qty 2

## 2023-08-06 MED ORDER — DOCUSATE SODIUM 50 MG/5ML PO LIQD
100.0000 mg | Freq: Two times a day (BID) | ORAL | Status: DC
  Administered 2023-08-06 – 2023-08-07 (×2): 100 mg via ORAL
  Filled 2023-08-06 (×2): qty 10

## 2023-08-06 MED ORDER — FAMOTIDINE 20 MG PO TABS
20.0000 mg | ORAL_TABLET | Freq: Every day | ORAL | Status: DC
  Administered 2023-08-07 – 2023-08-11 (×5): 20 mg via ORAL
  Filled 2023-08-06 (×6): qty 1

## 2023-08-06 MED ORDER — PHENOL 1.4 % MT LIQD
1.0000 | OROMUCOSAL | Status: DC | PRN
  Filled 2023-08-06: qty 177

## 2023-08-06 MED ORDER — GADOBUTROL 1 MMOL/ML IV SOLN
7.5000 mL | Freq: Once | INTRAVENOUS | Status: AC | PRN
  Administered 2023-08-06: 7.5 mL via INTRAVENOUS

## 2023-08-06 NOTE — Progress Notes (Signed)
ANTICOAGULATION CONSULT NOTE  Pharmacy Consult for Heparin  Indication: pulmonary embolus Brief A/P: Heparin level within goal range Continue Heparin at current rate   Allergies  Allergen Reactions   Actos [Pioglitazone] Other (See Comments)    REACTION: pt states INTOL w/ edema   Codeine Other (See Comments)    REACTION: vomiting   Januvia [Sitagliptin] Nausea Only   Lactose Intolerance (Gi) Diarrhea   Morphine Nausea Only and Nausea And Vomiting    REACTION: vomiting REACTION: vomiting   Repatha [Evolocumab]     WAS NOT EFFECTIVE IN LOWERING LDL    Patient Measurements: Height: 5\' 2"  (157.5 cm) Weight: 77.2 kg (170 lb 3.1 oz) IBW/kg (Calculated) : 50.1 Heparin Dosing Weight: 64.5 kg  Vital Signs: Temp: 99.3 F (37.4 C) (09/21 0230) BP: 103/59 (09/21 0230) Pulse Rate: 97 (09/21 0230)  Labs: Recent Labs    08/03/23 1956 08/03/23 2359 08/04/23 0032 08/04/23 0249 08/04/23 0522 08/04/23 1123 08/04/23 1540 08/04/23 2036 08/05/23 0424 08/05/23 0601 08/05/23 0756 08/05/23 1616 08/06/23 0141  HGB 11.7* 9.4*   < > 10.9*   < >  --  8.5*  --   --  7.2* 7.5*  --   --   HCT 35.9* 31.0*   < > 32.9*   < >  --  25.0*  --   --  21.1* 22.0*  --   --   PLT 220 146*  --  151  --   --   --   --   --  166  --   --   --   APTT  --  141*  --  103*  --  35  --   --   --   --   --   --   --   LABPROT  --  23.6*  --   --   --   --   --   --   --   --   --   --   --   INR  --  2.1*  --   --   --   --   --   --   --   --   --   --   --   HEPARINUNFRC  --   --   --   --   --   --   --    < >  --  0.71*  --  0.88* 0.51  CREATININE 1.73*  --   --  1.82*  --   --   --   --  1.69*  --   --   --   --   TROPONINIHS 119* 122*  --   --   --   --   --   --   --   --   --   --   --    < > = values in this interval not displayed.    Estimated Creatinine Clearance: 30.6 mL/min (A) (by C-G formula based on SCr of 1.69 mg/dL (H)).  Assessment: 67 y.o. female with PE s/p TNKase and thrombectomy  for heparin.  Goal of Therapy:  Heparin level 0.3-0.7 units/ml  Monitor platelets by anticoagulation protocol: Yes   Plan:  No change to heparin  Geannie Risen, PharmD, BCPS  08/06/2023  2:46 AM

## 2023-08-06 NOTE — Plan of Care (Signed)
Problem: Education: Goal: Knowledge of General Education information will improve Description: Including pain rating scale, medication(s)/side effects and non-pharmacologic comfort measures Outcome: Progressing   Problem: Health Behavior/Discharge Planning: Goal: Ability to manage health-related needs will improve Outcome: Progressing   Problem: Clinical Measurements: Goal: Respiratory complications will improve Outcome: Progressing   Problem: Nutrition: Goal: Adequate nutrition will be maintained Outcome: Progressing   Problem: Elimination: Goal: Will not experience complications related to bowel motility Outcome: Progressing

## 2023-08-06 NOTE — Progress Notes (Signed)
NAME:  Jocelyn [name], MRN:  [id number], DOB:  03-12-1956, LOS: 3 ADMISSION DATE:  08/03/2023, CONSULTATION DATE:  08/06/2023  REFERRING MD:  Doran Durand EDP, CHIEF COMPLAINT: Shortness of breath  History of Present Illness:  67 year old woman presented to ED with shortness of breath for 1 week. She also experienced presyncope. She was evaluated by cardiology on 9/12 for vague symptoms of fatigue, myocardial perfusion study was performed 9/17 which was normal. She was hypertensive on arrival to the ED, hypothermic 95 7, hypoxic requiring 6 L nasal cannula. Initial labs showed lactate of 7.6.  Code sepsis was initiated, COVID testing was negative. CT angiogram chest was obtained which showed saddle pulm embolism as well as extensive bilateral emboli with RV/LV ratio 1.7. Soon after arrival to ICU, she developed PEA arrest with ROSC after 5 minutes of CPR, epi x 1, TNK given, intubated post arrest. Admitted to ICU for ongoing care.  Pertinent  Medical History  Inferior wall STEMI 06/2017, status post DES to RCA Right subclavian lusoria variant Hyperlipidemia Polymyositis on azathioprine and steroids Sarcoidosis, inactive by imaging  Significant Hospital Events: Including procedures, antibiotic start and stop dates in addition to other pertinent events   9/18 PEA arrest after arrival to ICU. Given TNK for massive PE, underwent IR thrombectomy 9/19 Concern for seizure like activity but no evidence on EEG, CT head negative for acute intracraniail processes. 9/20 Extubated Interim History / Subjective:  Extubated On pressors, increased a bit overnight then reduced milrinone and levo needs decreased concurrently.  Gtt: levo 9, milrinone 0.125  Objective   Blood pressure 125/61, pulse (!) 110, temperature 99.5 F (37.5 C), resp. rate (!) 21, height 5\' 2"  (1.575 m), weight 80.7 kg, SpO2 97%. CVP:  [13 mmHg] 13 mmHg      Intake/Output Summary (Last 24 hours) at 08/06/2023 0836 Last data filed at  08/06/2023 0700 Gross per 24 hour  Intake 2067.58 ml  Output 1000 ml  Net 1067.58 ml   Filed Weights   08/03/23 1937 08/05/23 0425 08/06/23 0500  Weight: 68.9 kg 77.2 kg 80.7 kg    Examination: No distress Lungs clear R Hand swollen from IV Moves to command Aox3 R groin soft, no induration, no flank bruising  Continued drop in H/H noted SvO2 good LFTs pending  Resolved Hospital Problem list   Metabolic acidosis  Assessment & Plan:   Massive pulmonary embolism w/ cor pulmonale Cardiogenic shock S/p PEA arrest Acute respiratory failure with hypoxia 2/2 above Lactic acidosis, resolved Shock liver/hepatic congestion C/f seizure- EEG neg, MRI w/ small meningioma CAD s/p DES to RCA- 2018, on plavix PTA AKI- in setting of low flow state Polymyositis  Dysphagia after extubation Type 2 diabetes mellitus   - Levo for MAP 65 - Milrinone wean per AHF - Hold heparin, 1 unit blood, noon CBC - If H/H does not respond or pressors go up, get CT A/P r/o RP bleed given recent femoral instrumentation; otherwise resume heparin - Aspirin for now, hold plavix - SLP eval then resume imuran - Hydrocortisone for now - No driving 6 months, repeat imaging in approx 12 mo for meningioma, no role for AEDs per neuro - On basal bolus insulin  Best Practice (right click and "Reselect all SmartList Selections" daily)   Diet/type: pending speech eval DVT prophylaxis: systemic heparin GI prophylaxis: N/A Lines: Cenral line Foley:  Yes, and it is still needed Code Status:  full code Last date of multidisciplinary goals of care discussion: updated patient at bedside  33 min cc time 08/06/2023

## 2023-08-06 NOTE — Progress Notes (Signed)
ANTICOAGULATION CONSULT NOTE - Follow Up Consult  Pharmacy Consult for Heparin IV Indication: pulmonary embolus  Allergies  Allergen Reactions   Actos [Pioglitazone] Other (See Comments)    REACTION: pt states INTOL w/ edema   Codeine Other (See Comments)    REACTION: vomiting   Januvia [Sitagliptin] Nausea Only   Lactose Intolerance (Gi) Diarrhea   Morphine Nausea Only and Nausea And Vomiting    REACTION: vomiting REACTION: vomiting   Repatha [Evolocumab]     WAS NOT EFFECTIVE IN LOWERING LDL    Patient Measurements: Height: 5\' 2"  (157.5 cm) Weight: 80.7 kg (177 lb 14.6 oz) IBW/kg (Calculated) : 50.1 Heparin Dosing Weight: 64.5 kg  Vital Signs: Temp: 99.7 F (37.6 C) (09/21 2200) Temp Source: Bladder (09/21 1121) BP: 106/65 (09/21 2100) Pulse Rate: 100 (09/21 2200)  Labs: Recent Labs    08/03/23 2359 08/04/23 0032 08/04/23 0249 08/04/23 0522 08/04/23 1123 08/04/23 1540 08/05/23 0424 08/05/23 0601 08/05/23 0756 08/05/23 1616 08/06/23 0141 08/06/23 0630 08/06/23 0742 08/06/23 1252 08/06/23 2204  HGB 9.4*   < > 10.9*   < >  --    < >  --  7.2*   < >  --   --  4.9* 6.8* 8.2*  --   HCT 31.0*   < > 32.9*   < >  --    < >  --  21.1*   < >  --   --  14.5* 19.5* 23.2*  --   PLT 146*  --  151  --   --   --   --  166  --   --   --  100*  --  128*  --   APTT 141*  --  103*  --  35  --   --   --   --   --   --   --   --   --   --   LABPROT 23.6*  --   --   --   --   --   --   --   --   --   --   --   --   --   --   INR 2.1*  --   --   --   --   --   --   --   --   --   --   --   --   --   --   HEPARINUNFRC  --   --   --   --   --    < >  --  0.71*  --  0.88* 0.51  --   --   --  0.19*  CREATININE  --   --  1.82*  --   --   --  1.69*  --   --   --   --  1.58*  --   --   --   TROPONINIHS 122*  --   --   --   --   --   --   --   --   --   --   --   --   --   --    < > = values in this interval not displayed.    Estimated Creatinine Clearance: 33.5 mL/min (A) (by C-G  formula based on SCr of 1.58 mg/dL (H)).   Medications:  Scheduled:   aspirin EC  81 mg Oral Daily   azaTHIOprine  100 mg  Oral Daily   Chlorhexidine Gluconate Cloth  6 each Topical Daily   docusate  100 mg Oral BID   [START ON 08/07/2023] famotidine  20 mg Oral Daily   feeding supplement (PROSource TF20)  60 mL Per Tube Daily   fentaNYL (SUBLIMAZE) injection  25 mcg Intravenous Once   hydrocortisone sod succinate (SOLU-CORTEF) inj  100 mg Intravenous Q12H   insulin aspart  0-20 Units Subcutaneous Q4H   insulin detemir  8 Units Subcutaneous BID   [START ON 08/07/2023] polyethylene glycol  17 g Oral Daily   Infusions:   sodium chloride     sodium chloride Stopped (08/06/23 0422)   feeding supplement (VITAL 1.5 CAL) Stopped (08/06/23 2244)   heparin 600 Units/hr (08/06/23 2200)   norepinephrine (LEVOPHED) Adult infusion Stopped (08/06/23 1638)    Assessment: 67 years of age female with saddle PE initially on IV Heparin then coded and received TNK at 2300 PM on 9/18.  Heparin level originally therapeutic (0.51) while on heparin 600 units/hr. Hgb dropped and heparin held for a few hours. Restarted at previous rate.  Heparin level resulted at 0.19 units/mL (subtherapeutic) on heparin 600 units/hr. RN reports no signs of bleeding. Vital signs stable at this time.  Goal of Therapy:  Heparin level 0.3-0.7 units/ml  Monitor platelets by anticoagulation protocol: Yes   Plan:  Increase heparin to 700 units/hr Check heparin level in 6 hours Daily CBC and heparin level  Eldridge Scot, PharmD, BCCCP Clinical Pharmacist 08/06/2023, 10:48 PM

## 2023-08-06 NOTE — Progress Notes (Signed)
MRI with small meningioma which is most likely incidental. I continue to think the seizure is most likely provoked, and therefore would not start AEDs, but with this finding, I do think that driving prohibitions are needed and would favor no driving for 6 months to ensure no seizure recurrence. Neurology will be available as needed. Meningioma can be followed serially with outpatient imaging.   Ritta Slot, MD [facility] [phone]  If 7pm- 7am, please page neurology on call as listed in AMION.

## 2023-08-06 NOTE — Evaluation (Signed)
Clinical/Bedside Swallow Evaluation Patient Details  Name: RHIANNA SCHWENK MRN: [id number] Date of Birth: [birth year]  Today's Date: 08/06/2023 Time: SLP Start Time (ACUTE ONLY): 1120 SLP Stop Time (ACUTE ONLY): 1200 SLP Time Calculation (min) (ACUTE ONLY): 40 min  Past Medical History:  Past Medical History:  Diagnosis Date   Acute bronchitis    Allergic rhinitis, cause unspecified    Anemia    Anxiety    B12 deficiency    BV (bacterial vaginosis) 06/22/1996   Calculus of kidney    Calculus of kidney    Coronary atherosclerosis of unspecified type of vessel, native or graft    Dizziness    Essential hypertension, benign    Fibroid 2003   Fibromyalgia    H/O dysmenorrhea 2008   H/O varicella    Headache(784.0)    frequently   HSV-2 infection 2009   Hyperplastic colon polyp 05/16/2014   Irritable bowel syndrome    Meniere's disease, unspecified    Menses, irregular 2003   Myalgia and myositis, unspecified    Myocardial infarction (HCC)    Obstructive sleep apnea (adult) (pediatric)    Perimenopausal symptoms 2003   Pure hypercholesterolemia    Sarcoidosis    Type II or unspecified type diabetes mellitus without mention of complication, not stated as uncontrolled    Unspecified venous (peripheral) insufficiency    Vitamin D deficiency    Vulvitis 2010   Yeast infection    Past Surgical History:  Past Surgical History:  Procedure Laterality Date   COLONOSCOPY     CORONARY STENT INTERVENTION N/A 06/15/2017   Procedure: CORONARY STENT INTERVENTION;  Surgeon: Corky Crafts, MD;  Location: [facility];  Service: Cardiovascular;  Laterality: N/A;   heart catherization     IR ANGIOGRAM PULMONARY BILATERAL SELECTIVE  08/04/2023   IR ANGIOGRAM SELECTIVE EACH ADDITIONAL VESSEL  08/04/2023   IR ANGIOGRAM SELECTIVE EACH ADDITIONAL VESSEL  08/04/2023   IR THROMBECT PRIM MECH INIT (INCLU) MOD SED  08/04/2023   IR US GUIDE VASC ACCESS RIGHT  08/04/2023   LEFT HEART CATH AND  CORONARY ANGIOGRAPHY N/A 06/15/2017   Procedure: LEFT HEART CATH AND CORONARY ANGIOGRAPHY;  Surgeon: Corky Crafts, MD;  Location: [facility];  Service: Cardiovascular;  Laterality: N/A;   RADIOLOGY WITH ANESTHESIA N/A 08/04/2023   Procedure: IR WITH ANESTHESIA;  Surgeon: Julieanne Cotton, MD;  Location: [facility] OR;  Service: Radiology;  Laterality: N/A;   TONSILLECTOMY     HPI:  Patient is a 67 y.o. female with PMH: HLD, STEMI in 2018, GERD. She presented to the ED on 08/03/2023 with c/o of SOB for one week as well as presyncope. She was evaluated by cardiology on 9/12 for vague symptoms of fatigue, myocardial perfusion study was performed 9/17 which was normal. She was hypertensive in ED, hypothermic (95.7) and hypoxic requiring 6L oxygen via nasal cannula. Soon after arrival to ICU, she developed PEA arrest with ROSC after 5 minutes of CPR, epi x1; TNK given and she was intubated s/p arrest. She was extubated on 08/05/23 but failed Yale swallow with RN and was kept NPO awaiting SLP swallow evaluation.    Assessment / Plan / Recommendation  Clinical Impression  Patient is presenting with what appears to be a primary esophageal-based dysphagia but with oropharyngeal phase appearing WFL. Her voice was strong, clear and per patient and family, seems to be back to normal. She has a congested cough but has not been expectorating anything. Initially, when patient taking  sips of water, she exhibited both belching and nausea. Nausea subsided but belching continued throughout this evaluation. Per patient's daughter, patient had not had anything to eat since Tuesday and on Wednesday, the only thing she could tolerate was ice because of her nausea. SLP assessed patient's swallow function via PO's of thin liquids (water), puree solids (applesauce) and regular solids (graham crackers). She was able to feed her self, exhibited timely mastication and timely swallow initiation. No overt s/s aspiration or  penetration. Occasional congested sounding cough occured in absence of PO's. SLP discussed patient's h/o GERD and she reported that had not been on prescription PPI in a while and that her previous two GI MD's had both left the practice she was going to (one retired, one moved) and she had not found a new GI MD. SLP recommending initiate PO diet of regular solids, thin liquids and follow reflux precautions. No SLP f/u warranted at this time. SLP Visit Diagnosis: Dysphagia, unspecified (R13.10)    Aspiration Risk  No limitations    Diet Recommendation Regular;Thin liquid    Liquid Administration via: Cup;Straw Medication Administration: Whole meds with liquid Supervision: Patient able to self feed Compensations: Slow rate;Small sips/bites Postural Changes: Seated upright at 90 degrees;Remain upright for at least 30 minutes after po intake    Other  Recommendations Oral Care Recommendations: Oral care BID;Patient independent with oral care    Recommendations for follow up therapy are one component of a multi-disciplinary discharge planning process, led by the attending physician.  Recommendations may be updated based on patient status, additional functional criteria and insurance authorization.  Follow up Recommendations No SLP follow up      Assistance Recommended at Discharge    Functional Status Assessment Patient has had a recent decline in their functional status and demonstrates the ability to make significant improvements in function in a reasonable and predictable amount of time.  Frequency and Duration     N/A       Prognosis   N/A     Swallow Study   General Date of Onset: 08/03/23 HPI: Patient is a 67 y.o. female with PMH: HLD, STEMI in 2018, GERD. She presented to the ED on 08/03/2023 with c/o of SOB for one week as well as presyncope. She was evaluated by cardiology on 9/12 for vague symptoms of fatigue, myocardial perfusion study was performed 9/17 which was normal. She  was hypertensive in ED, hypothermic (95.7) and hypoxic requiring 6L oxygen via nasal cannula. Soon after arrival to ICU, she developed PEA arrest with ROSC after 5 minutes of CPR, epi x1; TNK given and she was intubated s/p arrest. She was extubated on 08/05/23 but failed Yale swallow with RN and was kept NPO awaiting SLP swallow evaluation. Type of Study: Bedside Swallow Evaluation Previous Swallow Assessment: none found Diet Prior to this Study: NPO Temperature Spikes Noted: Yes (99) Respiratory Status: Nasal cannula History of Recent Intubation: Yes Total duration of intubation (days): 3 days Date extubated: 08/05/23 Behavior/Cognition: Alert;Cooperative;Pleasant mood Oral Cavity Assessment: Within Functional Limits Oral Care Completed by SLP: Other (Comment) (patient completed) Oral Cavity - Dentition: Adequate natural dentition Vision: Functional for self-feeding Self-Feeding Abilities: Able to feed self Patient Positioning: Upright in bed Baseline Vocal Quality: Normal Volitional Cough: Congested Volitional Swallow: Able to elicit    Oral/Motor/Sensory Function Overall Oral Motor/Sensory Function: Within functional limits   Ice Chips     Thin Liquid Thin Liquid: Within functional limits Presentation: Straw;Self Fed;Cup    Nectar Thick  Honey Thick     Puree Puree: Within functional limits Presentation: Spoon;Self Fed   Solid     Solid: Within functional limits Presentation: Self Fed     Angela Nevin, MA, CCC-SLP Speech Therapy

## 2023-08-06 NOTE — Progress Notes (Signed)
Progress Note  Patient Name: Jocelyn [name] Date of Encounter: 08/06/2023  Primary Cardiologist: Lance Muss, MD  Interval Summary   Chart reviewed.  Discussed with patient and family member in room.  She denies any chest pain or shortness of breath at rest.  No cough or hemoptysis.  Vital Signs    Vitals:   08/06/23 0915 08/06/23 0916 08/06/23 0926 08/06/23 0930  BP: (!) 97/58 (!) 111/53 (!) 111/53 (!) 105/53  Pulse: (!) 106 (!) 106 (!) 108 100  Resp: 19 (!) 21 19 19   Temp: 99.5 F (37.5 C) 99.5 F (37.5 C) 99.5 F (37.5 C) 99.5 F (37.5 C)  TempSrc:   Bladder   SpO2: 97% 98% 98% 97%  Weight:      Height:        Intake/Output Summary (Last 24 hours) at 08/06/2023 0952 Last data filed at 08/06/2023 0836 Gross per 24 hour  Intake 2168.4 ml  Output 1150 ml  Net 1018.4 ml   Filed Weights   08/03/23 1937 08/05/23 0425 08/06/23 0500  Weight: 68.9 kg 77.2 kg 80.7 kg    Physical Exam   GEN: No acute distress.   Neck: No JVD. Cardiac: RRR, no murmur, rub, or gallop.  Respiratory: Nonlabored. Clear to auscultation bilaterally. GI: Soft, nontender, bowel sounds present. MS: Right hand edema. Neuro:  Nonfocal. Psych: Alert and oriented x 3. Normal affect.  ECG/Telemetry    Telemetry reviewed showing sinus tachycardia.  Labs    Chemistry Recent Labs  Lab 08/04/23 0249 08/04/23 0522 08/05/23 0424 08/05/23 0756 08/06/23 0630  NA 141   < > 135 132* 134*  K 5.2*   < > 3.4* 3.6 3.5  CL 108  --  97*  --  99  CO2 9*  --  24  --  24  GLUCOSE 212*  --  205*  --  169*  BUN 31*  --  34*  --  33*  CREATININE 1.82*  --  1.69*  --  1.58*  CALCIUM 8.2*  --  7.7*  --  7.6*  PROT  --   --  4.5*  --   --   ALBUMIN  --   --  2.4*  --   --   AST  --   --  503*  --   --   ALT  --   --  208*  --   --   ALKPHOS  --   --  51  --   --   BILITOT  --   --  0.9  --   --   GFRNONAA 30*  --  33*  --  35*  ANIONGAP 24*  --  14  --  11   < > = values in this interval not  displayed.    Hematology Recent Labs  Lab 08/04/23 0249 08/04/23 0522 08/05/23 0601 08/05/23 0756 08/06/23 0630 08/06/23 0742  WBC 12.7*  --  9.2  --  6.2  --   RBC 2.94*  --  2.07*  --  1.37*  --   HGB 10.9*   < > 7.2* 7.5* 4.9* 6.8*  HCT 32.9*   < > 21.1* 22.0* 14.5* 19.5*  MCV 111.9*  --  101.9*  --  105.8*  --   MCH 37.1*  --  34.8*  --  35.8*  --   MCHC 33.1  --  34.1  --  33.8  --   RDW 15.9*  --  15.4  --  15.2  --   PLT 151  --  166  --  100*  --    < > = values in this interval not displayed.   Cardiac Enzymes Recent Labs  Lab 08/03/23 1954 08/03/23 2359  TROPONINIHS 119* 122*   Lipid Panel     Component Value Date/Time   CHOL 205 (H) 12/16/2022 1608   TRIG 179 (H) 12/16/2022 1608   HDL 38 (L) 12/16/2022 1608   CHOLHDL 5.4 (H) 12/16/2022 1608   CHOLHDL 4 05/30/2018 1444   VLDL 36.2 05/30/2018 1444   LDLCALC 135 (H) 12/16/2022 1608   LDLDIRECT 137 (H) 12/16/2022 1608   LDLDIRECT 91.6 08/14/2014 0822   LABVLDL 32 12/16/2022 1608    Cardiac Studies   Echocardiogram 08/04/2023:  1. Left ventricular ejection fraction, by estimation, is 60 to 65%. The  left ventricle has normal function. The left ventricle has no regional  wall motion abnormalities. There is mild concentric left ventricular  hypertrophy. Left ventricular diastolic  parameters are indeterminate.   2. RVOT is dilated; RV appears dilated in short axis views. RV  McConnell's Sign; this can be seen in RV strain; it is best appreciated  with contrast imaging. Right ventricular systolic function is mildly  reduced. The right ventricular size is dilated.  There is normal pulmonary artery systolic pressure.   3. The mitral valve is grossly normal. No evidence of mitral valve  regurgitation. No evidence of mitral stenosis.   4. The aortic valve was not well visualized. Aortic valve regurgitation  is not visualized.   Assessment & Plan   1.  Large saddle pulmonary embolus with cor pulmonale  status post PEA arrest.  Status post thrombolytics and mechanical aspiration by IR.  Has been on norepinephrine and low-dose milrinone.  CVP over 10. Co-ox 70%.  2.  Acute kidney injury, creatinine down to 1.58.  3.  History of CAD status post inferior infarct in 2018 with DES to the RCA.  Recent Myoview showed no evidence of ischemia/scar.  Previously on Plavix as an outpatient, anticipate discontinuation particularly if she starts on DOAC after heparin.  4.  Acute anemia, presumably blood loss.  Heparin held with PRBC transfusion per primary team.  Recommend stopping milrinone at this time (discussed with Dr. Gala Romney).  Wean norepinephrine as tolerated, although may be delayed by acute blood loss anemia if also affecting hemodynamics.  Try and keep CVP around 10 if possible.  For questions or updates, please contact [facility] Please consult [email or web address] for contact info under   Signed, Nona Dell, MD  08/06/2023, 9:52 AM

## 2023-08-06 NOTE — Progress Notes (Signed)
eLink Physician-Brief Progress Note Patient Name: Jocelyn [name] DOB: 11-Mar-1956 MRN: [id number]   Date of Service  08/06/2023  HPI/Events of Note  Patient c/o 6/10 headache. Asking for a PRN for tylenol. Can take PO meds.  Discussed with RN, had headache last night. On heparin drip for PE. No neuro deficits. S/p IR thrombectomy for PE.   eICU Interventions  Tylenol ordered. Watch for any neuro deficits while on heparin drip     Intervention Category Intermediate Interventions: Pain - evaluation and management  Ranee Gosselin 08/06/2023, 8:38 PM   02:12 AM  Red alert  Camera: Discussed with RN. Confused, NIHSS score good. Non focal. S/p tPA, thrombectomy on 19 th.  No hx of dementia. Watch for now. If gets focal signs, to call back for CT scan. VS stable, normal. She is smliling and calm.

## 2023-08-06 NOTE — Progress Notes (Signed)
ANTICOAGULATION CONSULT NOTE - Follow Up Consult  Pharmacy Consult for Heparin IV Indication: pulmonary embolus  Allergies  Allergen Reactions   Actos [Pioglitazone] Other (See Comments)    REACTION: pt states INTOL w/ edema   Codeine Other (See Comments)    REACTION: vomiting   Januvia [Sitagliptin] Nausea Only   Lactose Intolerance (Gi) Diarrhea   Morphine Nausea Only and Nausea And Vomiting    REACTION: vomiting REACTION: vomiting   Repatha [Evolocumab]     WAS NOT EFFECTIVE IN LOWERING LDL    Patient Measurements: Height: 5\' 2"  (157.5 cm) Weight: 80.7 kg (177 lb 14.6 oz) IBW/kg (Calculated) : 50.1 Heparin Dosing Weight: 64.5 kg  Vital Signs: Temp: 99.1 F (37.3 C) (09/21 1300) Temp Source: Bladder (09/21 1121) BP: 111/74 (09/21 1300) Pulse Rate: 97 (09/21 1300)  Labs: Recent Labs    08/03/23 1954 08/03/23 2359 08/04/23 0032 08/04/23 0249 08/04/23 0522 08/04/23 1123 08/04/23 1540 08/05/23 0424 08/05/23 0601 08/05/23 0756 08/05/23 1616 08/06/23 0141 08/06/23 0630 08/06/23 0742 08/06/23 1252  HGB 11.7* 9.4*   < > 10.9*   < >  --    < >  --  7.2*   < >  --   --  4.9* 6.8* 8.2*  HCT 35.9* 31.0*   < > 32.9*   < >  --    < >  --  21.1*   < >  --   --  14.5* 19.5* 23.2*  PLT 220 146*  --  151  --   --   --   --  166  --   --   --  100*  --  128*  APTT  --  141*  --  103*  --  35  --   --   --   --   --   --   --   --   --   LABPROT  --  23.6*  --   --   --   --   --   --   --   --   --   --   --   --   --   INR  --  2.1*  --   --   --   --   --   --   --   --   --   --   --   --   --   HEPARINUNFRC  --   --   --   --   --   --    < >  --  0.71*  --  0.88* 0.51  --   --   --   CREATININE 1.73*  --   --  1.82*  --   --   --  1.69*  --   --   --   --  1.58*  --   --   TROPONINIHS 119* 122*  --   --   --   --   --   --   --   --   --   --   --   --   --    < > = values in this interval not displayed.    Estimated Creatinine Clearance: 33.5 mL/min (A) (by C-G  formula based on SCr of 1.58 mg/dL (H)).   Medications:  Scheduled:   aspirin EC  81 mg Oral Daily   azaTHIOprine  100 mg Oral Daily   Chlorhexidine Gluconate Cloth  6 each Topical Daily   docusate  100 mg Oral BID   [START ON 08/07/2023] famotidine  20 mg Oral Daily   feeding supplement (PROSource TF20)  60 mL Per Tube Daily   fentaNYL (SUBLIMAZE) injection  25 mcg Intravenous Once   hydrocortisone sod succinate (SOLU-CORTEF) inj  100 mg Intravenous Q12H   insulin aspart  0-20 Units Subcutaneous Q4H   insulin detemir  8 Units Subcutaneous BID   [START ON 08/07/2023] polyethylene glycol  17 g Oral Daily   Infusions:   sodium chloride     sodium chloride Stopped (08/06/23 0422)   feeding supplement (VITAL 1.5 CAL) 20 mL/hr at 08/06/23 1121   heparin Stopped (08/06/23 0733)   norepinephrine (LEVOPHED) Adult infusion 8 mcg/min (08/06/23 1121)    Assessment: 67 years of age female with saddle PE initially on IV Heparin then coded and received TNK at 2300 PM on 9/18.  Heparin level was at therapeutic goal (0.51) while running at 600 u/h. Paused this morning at 0800 for acute anemia, Hgb 4.9 > 6.8. Following repeat H/H of 8.2, will now resume therapy at 600 u/h without bolus per MD orders. No noted bleeding at this time.  Goal of Therapy:  Heparin level 0.3-0.7 units/ml  Monitor platelets by anticoagulation protocol: Yes   Plan:  Resume heparin infusion at 600 units/hr 8 hour heparin level at 2100 Daily CBC and heparin level  Monitor for s/sx of bleeding   Thank you for involving pharmacy in the patient's care.   Rutherford Nail, PharmD PGY2 Critical Care Pharmacy Resident 08/06/2023  1:20 PM

## 2023-08-07 ENCOUNTER — Telehealth: Payer: Self-pay | Admitting: Internal Medicine

## 2023-08-07 DIAGNOSIS — I2602 Saddle embolus of pulmonary artery with acute cor pulmonale: Secondary | ICD-10-CM | POA: Diagnosis not present

## 2023-08-07 LAB — HEPATIC FUNCTION PANEL
ALT: 91 U/L — ABNORMAL HIGH (ref 0–44)
AST: 72 U/L — ABNORMAL HIGH (ref 15–41)
Albumin: 2.4 g/dL — ABNORMAL LOW (ref 3.5–5.0)
Alkaline Phosphatase: 45 U/L (ref 38–126)
Bilirubin, Direct: 0.2 mg/dL (ref 0.0–0.2)
Indirect Bilirubin: 0.9 mg/dL (ref 0.3–0.9)
Total Bilirubin: 1.1 mg/dL (ref 0.3–1.2)
Total Protein: 4.7 g/dL — ABNORMAL LOW (ref 6.5–8.1)

## 2023-08-07 LAB — MAGNESIUM: Magnesium: 2.9 mg/dL — ABNORMAL HIGH (ref 1.7–2.4)

## 2023-08-07 LAB — LUPUS ANTICOAGULANT PANEL
DRVVT: 56.5 s — ABNORMAL HIGH (ref 0.0–47.0)
PTT Lupus Anticoagulant: 51.3 s — ABNORMAL HIGH (ref 0.0–43.5)

## 2023-08-07 LAB — CBC
HCT: 21.9 % — ABNORMAL LOW (ref 36.0–46.0)
Hemoglobin: 7.5 g/dL — ABNORMAL LOW (ref 12.0–15.0)
MCH: 33.9 pg (ref 26.0–34.0)
MCHC: 34.2 g/dL (ref 30.0–36.0)
MCV: 99.1 fL (ref 80.0–100.0)
Platelets: 114 10*3/uL — ABNORMAL LOW (ref 150–400)
RBC: 2.21 MIL/uL — ABNORMAL LOW (ref 3.87–5.11)
RDW: 17.5 % — ABNORMAL HIGH (ref 11.5–15.5)
WBC: 6.4 10*3/uL (ref 4.0–10.5)
nRBC: 6.9 % — ABNORMAL HIGH (ref 0.0–0.2)

## 2023-08-07 LAB — BASIC METABOLIC PANEL
Anion gap: 10 (ref 5–15)
BUN: 25 mg/dL — ABNORMAL HIGH (ref 8–23)
CO2: 29 mmol/L (ref 22–32)
Calcium: 8 mg/dL — ABNORMAL LOW (ref 8.9–10.3)
Chloride: 103 mmol/L (ref 98–111)
Creatinine, Ser: 1.22 mg/dL — ABNORMAL HIGH (ref 0.44–1.00)
GFR, Estimated: 48 mL/min — ABNORMAL LOW (ref >60)
Glucose, Bld: 127 mg/dL — ABNORMAL HIGH (ref 70–99)
Potassium: 4 mmol/L (ref 3.5–5.1)
Sodium: 142 mmol/L (ref 135–145)

## 2023-08-07 LAB — BPAM RBC
Blood Product Expiration Date: 202410202359
ISSUE DATE / TIME: 202409210908
Unit Type and Rh: 5100

## 2023-08-07 LAB — PHOSPHORUS: Phosphorus: 3.3 mg/dL (ref 2.5–4.6)

## 2023-08-07 LAB — TYPE AND SCREEN
ABO/RH(D): O POS
Antibody Screen: NEGATIVE
Unit division: 0

## 2023-08-07 LAB — COOXEMETRY PANEL
Carboxyhemoglobin: 2 % — ABNORMAL HIGH (ref 0.5–1.5)
Methemoglobin: <0.7 % (ref 0.0–1.5)
O2 Saturation: 79.7 %
Total hemoglobin: 8.1 g/dL — ABNORMAL LOW (ref 12.0–16.0)

## 2023-08-07 LAB — GLUCOSE, CAPILLARY
Glucose-Capillary: 124 mg/dL — ABNORMAL HIGH (ref 70–99)
Glucose-Capillary: 134 mg/dL — ABNORMAL HIGH (ref 70–99)
Glucose-Capillary: 98 mg/dL (ref 70–99)
Glucose-Capillary: 117 mg/dL — ABNORMAL HIGH (ref 70–99)
Glucose-Capillary: 181 mg/dL — ABNORMAL HIGH (ref 70–99)
Glucose-Capillary: 132 mg/dL — ABNORMAL HIGH (ref 70–99)
Glucose-Capillary: 102 mg/dL — ABNORMAL HIGH (ref 70–99)

## 2023-08-07 LAB — HEXAGONAL PHASE PHOSPHOLIPID: Hexagonal Phase Phospholipid: 5 s (ref 0–11)

## 2023-08-07 LAB — DRVVT CONFIRM: dRVVT Confirm: 1 ratio (ref 0.8–1.2)

## 2023-08-07 LAB — HEPARIN LEVEL (UNFRACTIONATED)
Heparin Unfractionated: 0.26 IU/mL — ABNORMAL LOW (ref 0.30–0.70)
Heparin Unfractionated: 0.46 IU/mL (ref 0.30–0.70)

## 2023-08-07 LAB — PTT-LA MIX: PTT-LA Mix: 41.8 s — ABNORMAL HIGH (ref 0.0–40.5)

## 2023-08-07 LAB — DRVVT MIX: dRVVT Mix: 43.1 s — ABNORMAL HIGH (ref 0.0–40.4)

## 2023-08-07 MED ORDER — DOCUSATE SODIUM 100 MG PO CAPS
100.0000 mg | ORAL_CAPSULE | Freq: Two times a day (BID) | ORAL | Status: DC
  Administered 2023-08-07 – 2023-08-09 (×4): 100 mg via ORAL
  Filled 2023-08-07 (×7): qty 1

## 2023-08-07 MED ORDER — METHYLPREDNISOLONE 4 MG PO TABS
4.0000 mg | ORAL_TABLET | Freq: Every day | ORAL | Status: DC
  Administered 2023-08-07 – 2023-08-11 (×5): 4 mg via ORAL
  Filled 2023-08-07 (×5): qty 1

## 2023-08-07 NOTE — Progress Notes (Signed)
Progress Note  Patient Name: Jocelyn [name] Date of Encounter: 08/07/2023  Primary Cardiologist: Lance Muss, MD  Interval Summary   Patient working with PT/OT this morning.  Vital Signs    Vitals:   08/07/23 0700 08/07/23 0800 08/07/23 1016 08/07/23 1100  BP: 139/79 126/61 117/73 110/65  Pulse: 91 99 82 71  Resp: 16 18 19 11   Temp: 99 F (37.2 C) 99.1 F (37.3 C) 98.6 F (37 C) 97.9 F (36.6 C)  TempSrc:  Bladder    SpO2: 98% 98% 100% 100%  Weight:      Height:        Intake/Output Summary (Last 24 hours) at 08/07/2023 1222 Last data filed at 08/07/2023 1100 Gross per 24 hour  Intake 682.22 ml  Output 1875 ml  Net -1192.78 ml   Filed Weights   08/05/23 0425 08/06/23 0500 08/07/23 0439  Weight: 77.2 kg 80.7 kg 78.7 kg    ECG/Telemetry    Telemetry reviewed showing sinus rhythm.  Labs    Chemistry Recent Labs  Lab 08/05/23 0424 08/05/23 0756 08/06/23 0630 08/06/23 1359 08/07/23 0428  NA 135 132* 134*  --  142  K 3.4* 3.6 3.5  --  4.0  CL 97*  --  99  --  103  CO2 24  --  24  --  29  GLUCOSE 205*  --  169*  --  127*  BUN 34*  --  33*  --  25*  CREATININE 1.69*  --  1.58*  --  1.22*  CALCIUM 7.7*  --  7.6*  --  8.0*  PROT 4.5*  --   --  5.1* 4.7*  ALBUMIN 2.4*  --   --  2.6* 2.4*  AST 503*  --   --  113* 72*  ALT 208*  --   --  116* 91*  ALKPHOS 51  --   --  51 45  BILITOT 0.9  --   --  1.1 1.1  GFRNONAA 33*  --  35*  --  48*  ANIONGAP 14  --  11  --  10    Hematology Recent Labs  Lab 08/06/23 0630 08/06/23 0742 08/06/23 1252 08/07/23 0428  WBC 6.2  --  7.2 6.4  RBC 1.37*  --  2.39* 2.21*  HGB 4.9* 6.8* 8.2* 7.5*  HCT 14.5* 19.5* 23.2* 21.9*  MCV 105.8*  --  97.1 99.1  MCH 35.8*  --  34.3* 33.9  MCHC 33.8  --  35.3 34.2  RDW 15.2  --  16.5* 17.5*  PLT 100*  --  128* 114*   Cardiac Enzymes Recent Labs  Lab 08/03/23 1954 08/03/23 2359  TROPONINIHS 119* 122*   Lipid Panel     Component Value Date/Time   CHOL 205 (H)  12/16/2022 1608   TRIG 179 (H) 12/16/2022 1608   HDL 38 (L) 12/16/2022 1608   CHOLHDL 5.4 (H) 12/16/2022 1608   CHOLHDL 4 05/30/2018 1444   VLDL 36.2 05/30/2018 1444   LDLCALC 135 (H) 12/16/2022 1608   LDLDIRECT 137 (H) 12/16/2022 1608   LDLDIRECT 91.6 08/14/2014 0822   LABVLDL 32 12/16/2022 1608    Cardiac Studies   Echocardiogram 08/04/2023:  1. Left ventricular ejection fraction, by estimation, is 60 to 65%. The  left ventricle has normal function. The left ventricle has no regional  wall motion abnormalities. There is mild concentric left ventricular  hypertrophy. Left ventricular diastolic  parameters are indeterminate.   2. RVOT is  dilated; RV appears dilated in short axis views. RV  McConnell's Sign; this can be seen in RV strain; it is best appreciated  with contrast imaging. Right ventricular systolic function is mildly  reduced. The right ventricular size is dilated.  There is normal pulmonary artery systolic pressure.   3. The mitral valve is grossly normal. No evidence of mitral valve  regurgitation. No evidence of mitral stenosis.   4. The aortic valve was not well visualized. Aortic valve regurgitation  is not visualized.   Assessment & Plan   1.  Large saddle pulmonary embolus with cor pulmonale status post PEA arrest.  Status post thrombolytics and mechanical aspiration by IR.  Weaned off of norepinephrine and milrinone.  Systolics running 105-135.  At rest she is in sinus rhythm in the 70s to 80s.  2.  Acute kidney injury, creatinine down to 1.22.  Good urine output.  3.  History of CAD status post inferior infarct in 2018 with DES to the RCA.  Recent Myoview showed no evidence of ischemia/scar.  Previously on Plavix as an outpatient, anticipate discontinuation particularly if she starts on DOAC after heparin.  4.  Acute anemia, presumably blood loss.  Hemoglobin 7.5 this morning.  She has received PRBCs.  Workup and management per CCM.  She remains on IV  heparin, continue supportive measures.  For questions or updates, please contact [facility] Please consult [email or web address] for contact info under   Signed, Nona Dell, MD  08/07/2023, 12:22 PM

## 2023-08-07 NOTE — Telephone Encounter (Signed)
Front desk  Appt with app in 1-3 weeks for PE followu Appr with MR in 3-4 months - 15 min

## 2023-08-07 NOTE — Evaluation (Signed)
Occupational Therapy Evaluation Patient Details Name: Jocelyn [name] MRN: [id number] DOB: 10-29-1956 Today's Date: 08/07/2023   History of Present Illness Pt is a 67 y/o female who presents to the ED 08/03/23 with HTN, hypothermia and acute respiratory failure. CT revealed saddle pulm embolism as well as extensive bilateral emboli. Soon after arrival to ICU, she developed PEA arrest with ROSC after 5 minutes of CPR, intubated post arrest, extubated 9/20. MRI revealed a 6 mm meningioma along the L frontal convexity as an incidental finding. PMH significant for Anxiety, HTN, fibromyalgia, HSV-2, Meniere's disease, MI, DM II.   Clinical Impression   Jocelyn [name] was evaluated s/p the above admission list. She is typically indep and works part time at baseline. Upon evaluation the pt was limited by flat affect, slowed processing, chronic bilat knee pain, new O2 demand, decreased activity tolerance and generalized weakness. Overall she needed min A for bed mobility and CGA with RW for mobility, she benefited from cues for PLB for vitals management. Due to the deficits listed below the pt also needs up to mod A for LB ADLs and set up A for UB ADLs in sitting. [name] removed at the start of the session, pt quickly desatted to 79% and recovered with 3L, SpO2 >93% on 3L throughout. HR to 150 after mobility, sustaining 130s during mobility. Pt will benefit from continued acute OT services and HHOT.        If plan is discharge home, recommend the following: A little help with walking and/or transfers;A little help with bathing/dressing/bathroom;Assistance with cooking/housework;Direct supervision/assist for medications management;Direct supervision/assist for financial management;Assist for transportation;Help with stairs or ramp for entrance    Functional Status Assessment  Patient has had a recent decline in their functional status and demonstrates the ability to make significant improvements in function in a  reasonable and predictable amount of time.  Equipment Recommendations  None recommended by OT       Precautions / Restrictions Precautions Precautions: Fall Precaution Comments: Watch O2 & HR Restrictions Weight Bearing Restrictions: No      Mobility Bed Mobility Overal bed mobility: Needs Assistance Bed Mobility: Supine to Sit     Supine to sit: Min assist     General bed mobility comments: Assist for trunk elevation to full sitting position. Pt able to initiate movement and with good scoot out to EOB to get feet on the floor.    Transfers Overall transfer level: Needs assistance Equipment used: Rolling walker (2 wheels) Transfers: Sit to/from Stand Sit to Stand: Contact guard assist, Min assist           General transfer comment: VC's for hand placement on seated surface for safety. Once standing, pt required cues to reach for RW for support as she appeared unsteady and did not initiate on her own. Light assist to regain balance but progressed to CGA by end of session.      Balance Overall balance assessment: Needs assistance Sitting-balance support: No upper extremity supported, Feet supported Sitting balance-Leahy Scale: Fair     Standing balance support: Bilateral upper extremity supported, During functional activity, Reliant on assistive device for balance Standing balance-Leahy Scale: Fair Standing balance comment: Statically, able to stand without UE support but appears slightly unsteady - improved with time by end of session.                           ADL either performed or assessed with clinical judgement   ADL  Overall ADL's : Needs assistance/impaired Eating/Feeding: Set up;Sitting   Grooming: Set up;Sitting   Upper Body Bathing: Set up;Sitting   Lower Body Bathing: Minimal assistance;Sit to/from stand   Upper Body Dressing : Set up;Sitting   Lower Body Dressing: Moderate assistance;Sitting/lateral leans   Toilet Transfer: Contact  guard assist;Ambulation;Rolling walker (2 wheels)   Toileting- Clothing Manipulation and Hygiene: Contact guard assist;Sitting/lateral lean       Functional mobility during ADLs: Contact guard assist;Rolling walker (2 wheels) General ADL Comments: increased time and cues needed, no overt LOB. maintained 3L     Vision Baseline Vision/History: 0 No visual deficits Vision Assessment?: No apparent visual deficits     Perception Perception: Not tested       Praxis Praxis: Not tested       Pertinent Vitals/Pain Pain Assessment Pain Assessment: Faces Faces Pain Scale: Hurts a little bit Pain Location: "so-so" bilat knee pain Pain Descriptors / Indicators: Sore Pain Intervention(s): Monitored during session, Limited activity within patient's tolerance     Extremity/Trunk Assessment Upper Extremity Assessment Upper Extremity Assessment: Generalized weakness;RUE deficits/detail RUE Deficits / Details: pt and family expressed concern of R hand edema, likely due to blood draw/IV stick. Overall WFL with mild pain - encourage use RUE Sensation: WNL RUE Coordination: decreased fine motor   Lower Extremity Assessment Lower Extremity Assessment: Defer to PT evaluation   Cervical / Trunk Assessment Cervical / Trunk Assessment: Normal   Communication Communication Communication: No apparent difficulties Cueing Techniques: Verbal cues;Gestural cues   Cognition Arousal: Alert Behavior During Therapy: Flat affect Overall Cognitive Status: Impaired/Different from baseline Area of Impairment: Following commands, Safety/judgement, Problem solving, Awareness                       Following Commands: Follows one step commands inconsistently Safety/Judgement: Decreased awareness of safety, Decreased awareness of deficits Awareness: Emergent Problem Solving: Slow processing, Decreased initiation, Difficulty sequencing, Requires verbal cues General Comments: Pt with flat affect  and vague responses to questions at times. Pt consistently reports feeling "so-so" throughout mobility, not providing any other information. Daughter reports pt was her usual self until 2 am overnight and since then has been confused, paranoid, and flat.     General Comments  SpO2 down to 79% on RA, quick recovery on 3L. Maintained 3L throughout with SpO2 >93%. HR to 150 with mobility, sustaining 130s            Home Living Family/patient expects to be discharged to:: Private residence Living Arrangements: Alone Available Help at Discharge: Family;Available 24 hours/day Type of Home: House Home Access: Stairs to enter [employer] of Steps: 4 Entrance Stairs-Rails: Right;Left Home Layout: One level     Bathroom Shower/Tub: Chief Strategy Officer: Standard Bathroom Accessibility: Yes   Home Equipment: None          Prior Functioning/Environment Prior Level of Function : Independent/Modified Independent;Working/employed;Driving             Mobility Comments: 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 ADLs Comments: indep        OT Problem List: Decreased strength;Decreased range of motion;Impaired balance (sitting and/or standing);Decreased activity tolerance;Decreased cognition;Decreased safety awareness;Decreased knowledge of use of DME or AE;Decreased knowledge of precautions      OT Treatment/Interventions: Self-care/ADL training;Therapeutic exercise;DME and/or AE instruction;Therapeutic activities;Patient/family education;Balance training    OT Goals(Current goals can be found in the care plan section) Acute  Rehab OT Goals Patient Stated Goal: home OT Goal Formulation: With patient Time For Goal Achievement: 08/21/23 Potential to Achieve Goals: Good ADL Goals Pt Will Perform Grooming: with modified independence Pt Will Perform Lower Body Dressing: with modified independence Pt Will Transfer to  Toilet: with modified independence  OT Frequency: Min 1X/week    Co-evaluation PT/OT/SLP Co-Evaluation/Treatment: Yes Reason for Co-Treatment: Complexity of the patient's impairments (multi-system involvement);For patient/therapist safety;To address functional/ADL transfers PT goals addressed during session: Mobility/safety with mobility;Balance;Proper use of DME OT goals addressed during session: ADL's and self-care      AM-PAC OT "6 Clicks" Daily Activity     Outcome Measure Help from another person eating meals?: A Little Help from another person taking care of personal grooming?: A Little Help from another person toileting, which includes using toliet, bedpan, or urinal?: A Little Help from another person bathing (including washing, rinsing, drying)?: A Little Help from another person to put on and taking off regular upper body clothing?: A Little Help from another person to put on and taking off regular lower body clothing?: A Lot 6 Click Score: 17   End of Session Equipment Utilized During Treatment: Gait belt;Rolling walker (2 wheels);Oxygen Nurse Communication: Mobility status  Activity Tolerance: Patient tolerated treatment well Patient left: in chair;with call bell/phone within reach;with chair alarm set;with family/visitor present  OT Visit Diagnosis: Unsteadiness on feet (R26.81);Other abnormalities of gait and mobility (R26.89);Muscle weakness (generalized) (M62.81)                Time: 9563-8756 OT Time Calculation (min): 35 min Charges:  OT General Charges $OT Visit: 1 Visit OT Evaluation $OT Eval Moderate Complexity: 1 Mod  Derenda Mis, OTR/L Acute Rehabilitation Services Office [phone] Secure Chat Communication Preferred   Donia Pounds 08/07/2023, 10:18 AM

## 2023-08-07 NOTE — Progress Notes (Signed)
ANTICOAGULATION CONSULT NOTE - Follow Up Consult  Pharmacy Consult for Heparin IV Indication: pulmonary embolus  Allergies  Allergen Reactions   Actos [Pioglitazone] Other (See Comments)    REACTION: pt states INTOL w/ edema   Codeine Other (See Comments)    REACTION: vomiting   Januvia [Sitagliptin] Nausea Only   Lactose Intolerance (Gi) Diarrhea   Morphine Nausea Only and Nausea And Vomiting    REACTION: vomiting REACTION: vomiting   Repatha [Evolocumab]     WAS NOT EFFECTIVE IN LOWERING LDL    Patient Measurements: Height: 5\' 2"  (157.5 cm) Weight: 78.7 kg (173 lb 8 oz) IBW/kg (Calculated) : 50.1 Heparin Dosing Weight: 64.5 kg  Vital Signs: Temp: 99.1 F (37.3 C) (09/22 0600) BP: 133/72 (09/22 0600) Pulse Rate: 81 (09/22 0600)  Labs: Recent Labs    08/04/23 1123 08/04/23 1540 08/05/23 0424 08/05/23 0601 08/06/23 0141 08/06/23 0630 08/06/23 0742 08/06/23 1252 08/06/23 2204 08/07/23 0428 08/07/23 0552  HGB  --    < >  --    < >  --  4.9* 6.8* 8.2*  --  7.5*  --   HCT  --    < >  --    < >  --  14.5* 19.5* 23.2*  --  21.9*  --   PLT  --   --   --    < >  --  100*  --  128*  --  114*  --   APTT 35  --   --   --   --   --   --   --   --   --   --   HEPARINUNFRC  --    < >  --    < > 0.51  --   --   --  0.19*  --  0.26*  CREATININE  --   --  1.69*  --   --  1.58*  --   --   --  1.22*  --    < > = values in this interval not displayed.    Estimated Creatinine Clearance: 42.8 mL/min (A) (by C-G formula based on SCr of 1.22 mg/dL (H)).   Medications:  Scheduled:   aspirin EC  81 mg Oral Daily   azaTHIOprine  100 mg Oral Daily   Chlorhexidine Gluconate Cloth  6 each Topical Daily   docusate  100 mg Oral BID   famotidine  20 mg Oral Daily   feeding supplement (PROSource TF20)  60 mL Per Tube Daily   fentaNYL (SUBLIMAZE) injection  25 mcg Intravenous Once   hydrocortisone sod succinate (SOLU-CORTEF) inj  100 mg Intravenous Q12H   insulin aspart  0-20 Units  Subcutaneous Q4H   insulin detemir  8 Units Subcutaneous BID   polyethylene glycol  17 g Oral Daily   Infusions:   sodium chloride     sodium chloride Stopped (08/06/23 0422)   feeding supplement (VITAL 1.5 CAL) Stopped (08/06/23 2244)   heparin Stopped (08/07/23 0553)   norepinephrine (LEVOPHED) Adult infusion Stopped (08/06/23 1638)    Assessment: 67 years of age female with saddle PE initially on IV Heparin then coded and received TNK at 2300 PM on 9/18.  Heparin level originally therapeutic (0.51) while on heparin 600 units/hr. Hgb dropped and heparin held for a few hours. Restarted at previous rate.  HL 0.26 - subtherapeutic despite rate increase  Goal of Therapy:  Heparin level 0.3-0.7 units/ml  Monitor platelets by anticoagulation protocol: Yes  Plan:  Increase heparin to 800 units/hr Check heparin level in 6 hours Daily CBC and heparin level  Calton Dach, PharmD, [facility] Clinical Pharmacist 08/07/2023 6:58 AM

## 2023-08-07 NOTE — Progress Notes (Signed)
Lower Venous DVT Study Patient Name:  Jocelyn [name]  Date of Exam:   08/05/2023 Medical Rec #: [id number]       Accession #:    [id number] Date of Birth: 1955/12/01      Patient Gender: F Patient Age:   67 years Exam Location:  [facility] Procedure:      VAS [name] LOWER EXTREMITY VENOUS (DVT) Referring Phys: Melody Comas --------------------------------------------------------------------------------  Indications: Pulmonary embolism.  Limitations: Poor ultrasound/tissue interface and bandages. Comparison Study: No previous exams Performing Technologist: Jody Hill RVT, RDMS  Examination Guidelines: A complete evaluation includes B-mode imaging, spectral Doppler, color Doppler, and power Doppler as needed of all accessible portions of each vessel. Bilateral testing is considered an integral part of a complete examination. Limited examinations for reoccurring indications may be performed as noted. The reflux portion of the exam is performed with the patient in reverse Trendelenburg.  +---------+---------------+---------+-----------+----------+-------------------+ RIGHT    CompressibilityPhasicitySpontaneityPropertiesThrombus Aging      +---------+---------------+---------+-----------+----------+-------------------+ CFV                                                    Not visualized      +---------+---------------+---------+-----------+----------+-------------------+ SFJ                                                   Not visualized      +---------+---------------+---------+-----------+----------+-------------------+ FV Prox  None           No       No                   Acute               +---------+---------------+---------+-----------+----------+-------------------+ FV Mid   None           No       No                   Acute               +---------+---------------+---------+-----------+----------+-------------------+ FV DistalNone           No       No                   Acute               +---------+---------------+---------+-----------+----------+-------------------+ PFV      None           No       No                   Acute               +---------+---------------+---------+-----------+----------+-------------------+ POP      None           No       No                   Acute               +---------+---------------+---------+-----------+----------+-------------------+ PTV      Full                                                             +---------+---------------+---------+-----------+----------+-------------------+  NAME:  Jocelyn [name], MRN:  [id number], DOB:  11-Mar-1956, LOS: 4 ADMISSION DATE:  08/03/2023, CONSULTATION DATE:  08/07/2023  REFERRING MD:  Doran Durand EDP, CHIEF COMPLAINT: Shortness of breath  History of Present Illness:  67 year old woman presented to ED with shortness of breath for 1 week. She also experienced presyncope. She was evaluated by cardiology on 9/12 for vague symptoms of fatigue, myocardial perfusion study was performed 9/17 which was normal. She was hypertensive on arrival to the ED, hypothermic 95 7, hypoxic requiring 6 L nasal cannula. Initial labs showed lactate of 7.6.  Code sepsis was initiated, COVID testing was negative. CT angiogram chest was obtained which showed saddle pulm embolism as well as extensive bilateral emboli with RV/LV ratio 1.7. Soon after arrival to ICU, she developed PEA arrest with ROSC after 5 minutes of CPR, epi x 1, TNK given, intubated post arrest. Admitted to ICU for ongoing care.    Pertinent  Medical History  Inferior wall STEMI 06/2017, status post DES to RCA Right subclavian lusoria variant Hyperlipidemia Polymyositis on azathioprine and steroids Sarcoidosis, inactive by imaging  Significant Hospital Events: Including procedures, antibiotic start and stop dates in addition to other pertinent events   9/18 PEA arrest after arrival to ICU. Given TNK for massive PE, underwent IR thrombectomy Trop 122 peak Lacte 7.6 -. 9 9/19 Concern for seizure like activity but no evidence on EEG, CT head negative for acute intracraniail processes. 9/20 Extubated. Lactate normal 9/21: Extubated. On pressors, increased a bit overnight then reduced milrinone and levo needs decreased concurrently. Gtt: levo 9, milrinone 0.12. hgb   Interim History / Subjective:    9/22: wroekd with PT/OT -> got tachy to > 120 per RN. On3L [place] -.weaned to 2L [place] and pusle ox 100%. On heparin gtt. BMI > 30. Afebrile. AKi improved. Hgg 7,5 s/p PRBC yeserday . Having lunch. No  active bleed.  DAughter and son in law at bedside. At baseline was driving , functional and not on o2  Objective   Blood pressure 110/65, pulse 71, temperature 97.9 F (36.6 C), resp. rate 11, height 5\' 2"  (1.575 m), weight 78.7 kg, SpO2 100%.        Intake/Output Summary (Last 24 hours) at 08/07/2023 1302 Last data filed at 08/07/2023 1100 Gross per 24 hour  Intake 682.22 ml  Output 1875 ml  Net -1192.78 ml   Filed Weights   08/05/23 0425 08/06/23 0500 08/07/23 0439  Weight: 77.2 kg 80.7 kg 78.7 kg    Examination: General Appearance:  Looks better. Obese. Rt hand swollne Head:  Normocephalic, without obvious abnormality, atraumatic Eyes:  PERRL - yes, conjunctiva/corneas - muddy     Ears:  Normal external ear canals, both ears Nose:  G tube - no bt Long at 2L  Throat:  ETT TUBE - no , OG tube - no Neck:  Supple,  No enlargement/tenderness/nodules Lungs: Clear to auscultation bilaterall Heart:  S1 and S2 normal, no murmur, CVP - no.  Pressors - no Abdomen:  Soft, no masses, no organomegaly Genitalia / Rectal:  Not done Extremities:  Extremities- intact but RT hand swollen due to IV Skin:  ntact in exposed areas . Sacral area - not examined Neurologic:  Sedation - none -> RASS - +! [name] Moves all 4s - yes eating. CAM-ICU - neg . Orientation - x3+   g  Resolved Hospital Problem list   Metabolic acidosis  Assessment & Plan:   Massive pulmonary embolism w/ cor pulmonale Cardiogenic  NAME:  Jocelyn [name], MRN:  [id number], DOB:  11-Mar-1956, LOS: 4 ADMISSION DATE:  08/03/2023, CONSULTATION DATE:  08/07/2023  REFERRING MD:  Doran Durand EDP, CHIEF COMPLAINT: Shortness of breath  History of Present Illness:  67 year old woman presented to ED with shortness of breath for 1 week. She also experienced presyncope. She was evaluated by cardiology on 9/12 for vague symptoms of fatigue, myocardial perfusion study was performed 9/17 which was normal. She was hypertensive on arrival to the ED, hypothermic 95 7, hypoxic requiring 6 L nasal cannula. Initial labs showed lactate of 7.6.  Code sepsis was initiated, COVID testing was negative. CT angiogram chest was obtained which showed saddle pulm embolism as well as extensive bilateral emboli with RV/LV ratio 1.7. Soon after arrival to ICU, she developed PEA arrest with ROSC after 5 minutes of CPR, epi x 1, TNK given, intubated post arrest. Admitted to ICU for ongoing care.    Pertinent  Medical History  Inferior wall STEMI 06/2017, status post DES to RCA Right subclavian lusoria variant Hyperlipidemia Polymyositis on azathioprine and steroids Sarcoidosis, inactive by imaging  Significant Hospital Events: Including procedures, antibiotic start and stop dates in addition to other pertinent events   9/18 PEA arrest after arrival to ICU. Given TNK for massive PE, underwent IR thrombectomy Trop 122 peak Lacte 7.6 -. 9 9/19 Concern for seizure like activity but no evidence on EEG, CT head negative for acute intracraniail processes. 9/20 Extubated. Lactate normal 9/21: Extubated. On pressors, increased a bit overnight then reduced milrinone and levo needs decreased concurrently. Gtt: levo 9, milrinone 0.12. hgb   Interim History / Subjective:    9/22: wroekd with PT/OT -> got tachy to > 120 per RN. On3L [place] -.weaned to 2L [place] and pusle ox 100%. On heparin gtt. BMI > 30. Afebrile. AKi improved. Hgg 7,5 s/p PRBC yeserday . Having lunch. No  active bleed.  DAughter and son in law at bedside. At baseline was driving , functional and not on o2  Objective   Blood pressure 110/65, pulse 71, temperature 97.9 F (36.6 C), resp. rate 11, height 5\' 2"  (1.575 m), weight 78.7 kg, SpO2 100%.        Intake/Output Summary (Last 24 hours) at 08/07/2023 1302 Last data filed at 08/07/2023 1100 Gross per 24 hour  Intake 682.22 ml  Output 1875 ml  Net -1192.78 ml   Filed Weights   08/05/23 0425 08/06/23 0500 08/07/23 0439  Weight: 77.2 kg 80.7 kg 78.7 kg    Examination: General Appearance:  Looks better. Obese. Rt hand swollne Head:  Normocephalic, without obvious abnormality, atraumatic Eyes:  PERRL - yes, conjunctiva/corneas - muddy     Ears:  Normal external ear canals, both ears Nose:  G tube - no bt Long at 2L  Throat:  ETT TUBE - no , OG tube - no Neck:  Supple,  No enlargement/tenderness/nodules Lungs: Clear to auscultation bilaterall Heart:  S1 and S2 normal, no murmur, CVP - no.  Pressors - no Abdomen:  Soft, no masses, no organomegaly Genitalia / Rectal:  Not done Extremities:  Extremities- intact but RT hand swollen due to IV Skin:  ntact in exposed areas . Sacral area - not examined Neurologic:  Sedation - none -> RASS - +! [name] Moves all 4s - yes eating. CAM-ICU - neg . Orientation - x3+   g  Resolved Hospital Problem list   Metabolic acidosis  Assessment & Plan:   Massive pulmonary embolism w/ cor pulmonale Cardiogenic  NAME:  Jocelyn [name], MRN:  [id number], DOB:  11-Mar-1956, LOS: 4 ADMISSION DATE:  08/03/2023, CONSULTATION DATE:  08/07/2023  REFERRING MD:  Doran Durand EDP, CHIEF COMPLAINT: Shortness of breath  History of Present Illness:  67 year old woman presented to ED with shortness of breath for 1 week. She also experienced presyncope. She was evaluated by cardiology on 9/12 for vague symptoms of fatigue, myocardial perfusion study was performed 9/17 which was normal. She was hypertensive on arrival to the ED, hypothermic 95 7, hypoxic requiring 6 L nasal cannula. Initial labs showed lactate of 7.6.  Code sepsis was initiated, COVID testing was negative. CT angiogram chest was obtained which showed saddle pulm embolism as well as extensive bilateral emboli with RV/LV ratio 1.7. Soon after arrival to ICU, she developed PEA arrest with ROSC after 5 minutes of CPR, epi x 1, TNK given, intubated post arrest. Admitted to ICU for ongoing care.    Pertinent  Medical History  Inferior wall STEMI 06/2017, status post DES to RCA Right subclavian lusoria variant Hyperlipidemia Polymyositis on azathioprine and steroids Sarcoidosis, inactive by imaging  Significant Hospital Events: Including procedures, antibiotic start and stop dates in addition to other pertinent events   9/18 PEA arrest after arrival to ICU. Given TNK for massive PE, underwent IR thrombectomy Trop 122 peak Lacte 7.6 -. 9 9/19 Concern for seizure like activity but no evidence on EEG, CT head negative for acute intracraniail processes. 9/20 Extubated. Lactate normal 9/21: Extubated. On pressors, increased a bit overnight then reduced milrinone and levo needs decreased concurrently. Gtt: levo 9, milrinone 0.12. hgb   Interim History / Subjective:    9/22: wroekd with PT/OT -> got tachy to > 120 per RN. On3L [place] -.weaned to 2L [place] and pusle ox 100%. On heparin gtt. BMI > 30. Afebrile. AKi improved. Hgg 7,5 s/p PRBC yeserday . Having lunch. No  active bleed.  DAughter and son in law at bedside. At baseline was driving , functional and not on o2  Objective   Blood pressure 110/65, pulse 71, temperature 97.9 F (36.6 C), resp. rate 11, height 5\' 2"  (1.575 m), weight 78.7 kg, SpO2 100%.        Intake/Output Summary (Last 24 hours) at 08/07/2023 1302 Last data filed at 08/07/2023 1100 Gross per 24 hour  Intake 682.22 ml  Output 1875 ml  Net -1192.78 ml   Filed Weights   08/05/23 0425 08/06/23 0500 08/07/23 0439  Weight: 77.2 kg 80.7 kg 78.7 kg    Examination: General Appearance:  Looks better. Obese. Rt hand swollne Head:  Normocephalic, without obvious abnormality, atraumatic Eyes:  PERRL - yes, conjunctiva/corneas - muddy     Ears:  Normal external ear canals, both ears Nose:  G tube - no bt Long at 2L  Throat:  ETT TUBE - no , OG tube - no Neck:  Supple,  No enlargement/tenderness/nodules Lungs: Clear to auscultation bilaterall Heart:  S1 and S2 normal, no murmur, CVP - no.  Pressors - no Abdomen:  Soft, no masses, no organomegaly Genitalia / Rectal:  Not done Extremities:  Extremities- intact but RT hand swollen due to IV Skin:  ntact in exposed areas . Sacral area - not examined Neurologic:  Sedation - none -> RASS - +! [name] Moves all 4s - yes eating. CAM-ICU - neg . Orientation - x3+   g  Resolved Hospital Problem list   Metabolic acidosis  Assessment & Plan:   Massive pulmonary embolism w/ cor pulmonale Cardiogenic  NAME:  Jocelyn [name], MRN:  [id number], DOB:  11-Mar-1956, LOS: 4 ADMISSION DATE:  08/03/2023, CONSULTATION DATE:  08/07/2023  REFERRING MD:  Doran Durand EDP, CHIEF COMPLAINT: Shortness of breath  History of Present Illness:  67 year old woman presented to ED with shortness of breath for 1 week. She also experienced presyncope. She was evaluated by cardiology on 9/12 for vague symptoms of fatigue, myocardial perfusion study was performed 9/17 which was normal. She was hypertensive on arrival to the ED, hypothermic 95 7, hypoxic requiring 6 L nasal cannula. Initial labs showed lactate of 7.6.  Code sepsis was initiated, COVID testing was negative. CT angiogram chest was obtained which showed saddle pulm embolism as well as extensive bilateral emboli with RV/LV ratio 1.7. Soon after arrival to ICU, she developed PEA arrest with ROSC after 5 minutes of CPR, epi x 1, TNK given, intubated post arrest. Admitted to ICU for ongoing care.    Pertinent  Medical History  Inferior wall STEMI 06/2017, status post DES to RCA Right subclavian lusoria variant Hyperlipidemia Polymyositis on azathioprine and steroids Sarcoidosis, inactive by imaging  Significant Hospital Events: Including procedures, antibiotic start and stop dates in addition to other pertinent events   9/18 PEA arrest after arrival to ICU. Given TNK for massive PE, underwent IR thrombectomy Trop 122 peak Lacte 7.6 -. 9 9/19 Concern for seizure like activity but no evidence on EEG, CT head negative for acute intracraniail processes. 9/20 Extubated. Lactate normal 9/21: Extubated. On pressors, increased a bit overnight then reduced milrinone and levo needs decreased concurrently. Gtt: levo 9, milrinone 0.12. hgb   Interim History / Subjective:    9/22: wroekd with PT/OT -> got tachy to > 120 per RN. On3L [place] -.weaned to 2L [place] and pusle ox 100%. On heparin gtt. BMI > 30. Afebrile. AKi improved. Hgg 7,5 s/p PRBC yeserday . Having lunch. No  active bleed.  DAughter and son in law at bedside. At baseline was driving , functional and not on o2  Objective   Blood pressure 110/65, pulse 71, temperature 97.9 F (36.6 C), resp. rate 11, height 5\' 2"  (1.575 m), weight 78.7 kg, SpO2 100%.        Intake/Output Summary (Last 24 hours) at 08/07/2023 1302 Last data filed at 08/07/2023 1100 Gross per 24 hour  Intake 682.22 ml  Output 1875 ml  Net -1192.78 ml   Filed Weights   08/05/23 0425 08/06/23 0500 08/07/23 0439  Weight: 77.2 kg 80.7 kg 78.7 kg    Examination: General Appearance:  Looks better. Obese. Rt hand swollne Head:  Normocephalic, without obvious abnormality, atraumatic Eyes:  PERRL - yes, conjunctiva/corneas - muddy     Ears:  Normal external ear canals, both ears Nose:  G tube - no bt Long at 2L  Throat:  ETT TUBE - no , OG tube - no Neck:  Supple,  No enlargement/tenderness/nodules Lungs: Clear to auscultation bilaterall Heart:  S1 and S2 normal, no murmur, CVP - no.  Pressors - no Abdomen:  Soft, no masses, no organomegaly Genitalia / Rectal:  Not done Extremities:  Extremities- intact but RT hand swollen due to IV Skin:  ntact in exposed areas . Sacral area - not examined Neurologic:  Sedation - none -> RASS - +! [name] Moves all 4s - yes eating. CAM-ICU - neg . Orientation - x3+   g  Resolved Hospital Problem list   Metabolic acidosis  Assessment & Plan:   Massive pulmonary embolism w/ cor pulmonale Cardiogenic  Lower Venous DVT Study Patient Name:  Jocelyn [name]  Date of Exam:   08/05/2023 Medical Rec #: [id number]       Accession #:    [id number] Date of Birth: 1955/12/01      Patient Gender: F Patient Age:   67 years Exam Location:  [facility] Procedure:      VAS [name] LOWER EXTREMITY VENOUS (DVT) Referring Phys: Melody Comas --------------------------------------------------------------------------------  Indications: Pulmonary embolism.  Limitations: Poor ultrasound/tissue interface and bandages. Comparison Study: No previous exams Performing Technologist: Jody Hill RVT, RDMS  Examination Guidelines: A complete evaluation includes B-mode imaging, spectral Doppler, color Doppler, and power Doppler as needed of all accessible portions of each vessel. Bilateral testing is considered an integral part of a complete examination. Limited examinations for reoccurring indications may be performed as noted. The reflux portion of the exam is performed with the patient in reverse Trendelenburg.  +---------+---------------+---------+-----------+----------+-------------------+ RIGHT    CompressibilityPhasicitySpontaneityPropertiesThrombus Aging      +---------+---------------+---------+-----------+----------+-------------------+ CFV                                                    Not visualized      +---------+---------------+---------+-----------+----------+-------------------+ SFJ                                                   Not visualized      +---------+---------------+---------+-----------+----------+-------------------+ FV Prox  None           No       No                   Acute               +---------+---------------+---------+-----------+----------+-------------------+ FV Mid   None           No       No                   Acute               +---------+---------------+---------+-----------+----------+-------------------+ FV DistalNone           No       No                   Acute               +---------+---------------+---------+-----------+----------+-------------------+ PFV      None           No       No                   Acute               +---------+---------------+---------+-----------+----------+-------------------+ POP      None           No       No                   Acute               +---------+---------------+---------+-----------+----------+-------------------+ PTV      Full                                                             +---------+---------------+---------+-----------+----------+-------------------+

## 2023-08-07 NOTE — Progress Notes (Signed)
ANTICOAGULATION CONSULT NOTE - Follow Up Consult  Pharmacy Consult for Heparin IV Indication: pulmonary embolus  Allergies  Allergen Reactions   Actos [Pioglitazone] Other (See Comments)    REACTION: pt states INTOL w/ edema   Codeine Other (See Comments)    REACTION: vomiting   Januvia [Sitagliptin] Nausea Only   Lactose Intolerance (Gi) Diarrhea   Morphine Nausea Only and Nausea And Vomiting    REACTION: vomiting REACTION: vomiting   Repatha [Evolocumab]     WAS NOT EFFECTIVE IN LOWERING LDL    Patient Measurements: Height: 5\' 2"  (157.5 cm) Weight: 78.7 kg (173 lb 8 oz) IBW/kg (Calculated) : 50.1 Heparin Dosing Weight: 64.5 kg  Vital Signs: Temp: 98.2 F (36.8 C) (09/22 1400) Temp Source: Bladder (09/22 1200) BP: 123/71 (09/22 1400) Pulse Rate: 88 (09/22 1400)  Labs: Recent Labs    08/05/23 0424 08/05/23 0601 08/06/23 0630 08/06/23 0742 08/06/23 1252 08/06/23 2204 08/07/23 0428 08/07/23 0552 08/07/23 1549  HGB  --    < > 4.9* 6.8* 8.2*  --  7.5*  --   --   HCT  --    < > 14.5* 19.5* 23.2*  --  21.9*  --   --   PLT  --    < > 100*  --  128*  --  114*  --   --   HEPARINUNFRC  --    < >  --   --   --  0.19*  --  0.26* 0.46  CREATININE 1.69*  --  1.58*  --   --   --  1.22*  --   --    < > = values in this interval not displayed.    Estimated Creatinine Clearance: 42.8 mL/min (A) (by C-G formula based on SCr of 1.22 mg/dL (H)).   Medications:  Scheduled:   aspirin EC  81 mg Oral Daily   azaTHIOprine  100 mg Oral Daily   Chlorhexidine Gluconate Cloth  6 each Topical Daily   docusate sodium  100 mg Oral BID   famotidine  20 mg Oral Daily   insulin aspart  0-20 Units Subcutaneous Q4H   insulin detemir  8 Units Subcutaneous BID   methylPREDNISolone  4 mg Oral Daily   polyethylene glycol  17 g Oral Daily   Infusions:   sodium chloride     sodium chloride Stopped (08/06/23 0422)   heparin 800 Units/hr (08/07/23 1300)    Assessment: 67 years of age  female with saddle PE initially on IV Heparin then coded and received TNK at 2300 PM on 9/18.  Heparin level originally therapeutic (0.51) while on heparin 600 units/hr. Hgb dropped and heparin held for a few hours. Restarted at previous rate on 9/21.  Heparin level 0.46 units/mL (therapeutic) on heparin 800 units/hr  Goal of Therapy:  Heparin level 0.3-0.7 units/ml  Monitor platelets by anticoagulation protocol: Yes   Plan:  Continue heparin 800 units/hr Check heparin level with AM labs - may continue to rise as previously therapeutic at lower rate F/u plans for transitioning off heparin  Eldridge Scot, PharmD, [facility] Clinical Pharmacist 08/07/2023, 4:23 PM

## 2023-08-07 NOTE — Evaluation (Signed)
Physical Therapy Evaluation  Patient Details Name: Jocelyn [name] MRN: [id number] DOB: [birth year] Today's Date: 08/07/2023  History of Present Illness  Pt is a 67 y/o female who presents to the ED 08/03/23 with HTN, hypothermia and acute respiratory failure. CT revealed saddle pulm embolism as well as extensive bilateral emboli. Soon after arrival to ICU, she developed PEA arrest with ROSC after 5 minutes of CPR, intubated post arrest, extubated 9/20. MRI revealed a 6 mm meningioma along the L frontal convexity as an incidental finding. PMH significant for Anxiety, HTN, fibromyalgia, HSV-2, Meniere's disease, MI, DM II.   Clinical Impression  Pt admitted with above diagnosis. Pt currently with functional limitations due to the deficits listed below (see PT Problem List). At the time of PT eval pt was able to perform transfers and ambulation with gross CGA to min assist with RW for support. Pt on 3L/min supplemental O2 throughout session, with noted desaturation to 79% on RA at beginning of session while sitting EOB. Pt able to maintain O2 sat >93% throughout functional mobility on 3L. HR elevated to a max of 150 bpm during gait training, but able to improve to 125 bpm within 2 minutes during standing rest break. Of note, family reporting concern with cognition, stating she is confused, paranoid, and with flat affect starting around 2 am overnight. Pt will benefit from acute skilled PT to increase their independence and safety with mobility to allow discharge.           If plan is discharge home, recommend the following: A little help with walking and/or transfers;A little help with bathing/dressing/bathroom;Assistance with cooking/housework;Assist for transportation;Help with stairs or ramp for entrance   Can travel by private vehicle        Equipment Recommendations Rolling walker (2 wheels)  Recommendations for Other Services       Functional Status Assessment Patient has had a recent  decline in their functional status and demonstrates the ability to make significant improvements in function in a reasonable and predictable amount of time.     Precautions / Restrictions Precautions Precautions: Fall Precaution Comments: Watch O2 Restrictions Weight Bearing Restrictions: No      Mobility  Bed Mobility Overal bed mobility: Needs Assistance Bed Mobility: Supine to Sit     Supine to sit: Min assist     General bed mobility comments: Assist for trunk elevation to full sitting position. Pt able to initiate movement and with good scoot out to EOB to get feet on the floor.    Transfers Overall transfer level: Needs assistance Equipment used: Rolling walker (2 wheels) Transfers: Sit to/from Stand Sit to Stand: Contact guard assist, Min assist           General transfer comment: VC's for hand placement on seated surface for safety. Once standing, pt required cues to reach for RW for support as she appeared unsteady and did not initiate on her own. Light assist to regain balance but progressed to CGA by end of session.    Ambulation/Gait Ambulation/Gait assistance: Contact guard assist Gait Distance (Feet): 150 Feet Assistive device: Rolling walker (2 wheels) Gait Pattern/deviations: Step-through pattern, Decreased stride length, Trunk flexed, Drifts right/left Gait velocity: Decreased Gait velocity interpretation: <1.31 ft/sec, indicative of household ambulator Pre-gait activities: Marching in place EOB before gait training began. General Gait Details: Slow but generally steady with RW for support. Pt flat throughout, reporting that she feels "so-so" several times when asked. Noted HR up to a max of 150 bpm but  down to 125 with ~2 minute standing rest break. Pt does not report feeling as if heart beating fast or any SOB throughout gait training.  Stairs            Wheelchair Mobility     Tilt Bed    Modified Rankin (Stroke Patients Only)        Balance Overall balance assessment: Needs assistance Sitting-balance support: No upper extremity supported, Feet supported Sitting balance-Leahy Scale: Fair     Standing balance support: Bilateral upper extremity supported, During functional activity, Reliant on assistive device for balance Standing balance-Leahy Scale: Fair Standing balance comment: Statically, able to stand without UE support but appears slightly unsteady - improved with time by end of session.                             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       Extremity/Trunk Assessment   Upper Extremity Assessment Upper Extremity Assessment: Defer to OT evaluation    Lower Extremity Assessment Lower Extremity Assessment: Generalized weakness (Grossly 4/5 strength in quads, hamstrings, hip flexors, ankle DF bilaterally.)    Cervical / Trunk Assessment Cervical / Trunk Assessment: Normal  Communication   Communication Communication: No apparent difficulties Cueing Techniques: Verbal cues;Gestural cues  Cognition Arousal: Alert Behavior During Therapy: Flat affect Overall Cognitive Status: Impaired/Different from baseline Area of Impairment: Following commands, Safety/judgement, Problem solving, Awareness                       Following Commands: Follows one step commands  inconsistently Safety/Judgement: Decreased awareness of safety, Decreased awareness of deficits Awareness: Emergent Problem Solving: Slow processing, Decreased initiation, Difficulty sequencing, Requires verbal cues General Comments: Pt with flat affect and vague responses to questions at times. Pt consistently reports feeling "so-so" throughout mobility, not providing any other information. Daughter reports pt was her usual self until 2 am overnight and since then has been confused, paranoid, and flat.        General Comments      Exercises     Assessment/Plan    PT Assessment Patient needs continued PT services  PT Problem List Decreased strength;Decreased activity tolerance;Decreased balance;Decreased mobility;Decreased cognition;Decreased knowledge of use of DME;Decreased safety awareness;Decreased knowledge of precautions       PT Treatment Interventions DME instruction;Gait training;Stair training;Functional mobility training;Therapeutic activities;Therapeutic exercise;Balance training;Patient/family education    PT Goals (Current goals can be found in the Care Plan section)  Acute Rehab PT Goals Patient Stated Goal: None stated PT Goal Formulation: Patient unable to participate in goal setting Time For Goal Achievement: 08/21/23 Potential to Achieve Goals: Good    Frequency Min 1X/week     Co-evaluation PT/OT/SLP Co-Evaluation/Treatment: Yes Reason for Co-Treatment: Complexity of the patient's impairments (multi-system involvement);For patient/therapist safety;To address functional/ADL transfers PT goals addressed during session: Mobility/safety with mobility;Balance;Proper use of  DME         AM-PAC PT "6 Clicks" Mobility  Outcome Measure Help needed turning from your back to your side while in a flat bed without using bedrails?: A Little Help needed moving from lying on your back to sitting on the side of a flat bed without using bedrails?: A Little Help needed  moving to and from a bed to a chair (including a wheelchair)?: A Little Help needed standing up from a chair using your arms (e.g., wheelchair or bedside chair)?: A Little Help needed to walk in hospital room?: A Little Help needed climbing 3-5 steps with a railing? : A Little 6 Click Score: 18    End of Session Equipment Utilized During Treatment: Gait belt;Oxygen (3L/min supplemental O2) Activity Tolerance: Patient tolerated treatment well Patient left: in chair;with call bell/phone within reach;with chair alarm set;with family/visitor present Nurse Communication: Mobility status PT Visit Diagnosis: Unsteadiness on feet (R26.81);Difficulty in walking, not elsewhere classified (R26.2)    Time: 9562-1308 PT Time Calculation (min) (ACUTE ONLY): 32 min   Charges:   PT Evaluation $PT Eval Moderate Complexity: 1 Mod   PT General Charges $$ ACUTE PT VISIT: 1 Visit         Jocelyn [name], PT, DPT Acute Rehabilitation Services Secure Chat Preferred Office: [phone]   Jocelyn [name] 08/07/2023, 10:15 AM

## 2023-08-08 ENCOUNTER — Other Ambulatory Visit (HOSPITAL_COMMUNITY): Payer: Self-pay

## 2023-08-08 DIAGNOSIS — I2602 Saddle embolus of pulmonary artery with acute cor pulmonale: Secondary | ICD-10-CM | POA: Diagnosis not present

## 2023-08-08 DIAGNOSIS — J9601 Acute respiratory failure with hypoxia: Secondary | ICD-10-CM | POA: Diagnosis not present

## 2023-08-08 LAB — GLUCOSE, CAPILLARY
Glucose-Capillary: 93 mg/dL (ref 70–99)
Glucose-Capillary: 84 mg/dL (ref 70–99)
Glucose-Capillary: 103 mg/dL — ABNORMAL HIGH (ref 70–99)
Glucose-Capillary: 119 mg/dL — ABNORMAL HIGH (ref 70–99)
Glucose-Capillary: 94 mg/dL (ref 70–99)

## 2023-08-08 LAB — CBC
HCT: 22.8 % — ABNORMAL LOW (ref 36.0–46.0)
Hemoglobin: 7.4 g/dL — ABNORMAL LOW (ref 12.0–15.0)
MCH: 34.1 pg — ABNORMAL HIGH (ref 26.0–34.0)
MCHC: 32.5 g/dL (ref 30.0–36.0)
MCV: 105.1 fL — ABNORMAL HIGH (ref 80.0–100.0)
Platelets: 135 10*3/uL — ABNORMAL LOW (ref 150–400)
RBC: 2.17 MIL/uL — ABNORMAL LOW (ref 3.87–5.11)
RDW: 17.2 % — ABNORMAL HIGH (ref 11.5–15.5)
WBC: 7.3 10*3/uL (ref 4.0–10.5)
nRBC: 3.4 % — ABNORMAL HIGH (ref 0.0–0.2)

## 2023-08-08 LAB — MAGNESIUM: Magnesium: 2.7 mg/dL — ABNORMAL HIGH (ref 1.7–2.4)

## 2023-08-08 LAB — PHOSPHORUS: Phosphorus: 3.3 mg/dL (ref 2.5–4.6)

## 2023-08-08 LAB — HEPATIC FUNCTION PANEL
ALT: 65 U/L — ABNORMAL HIGH (ref 0–44)
AST: 43 U/L — ABNORMAL HIGH (ref 15–41)
Albumin: 2.3 g/dL — ABNORMAL LOW (ref 3.5–5.0)
Alkaline Phosphatase: 43 U/L (ref 38–126)
Bilirubin, Direct: 0.1 mg/dL (ref 0.0–0.2)
Indirect Bilirubin: 0.6 mg/dL (ref 0.3–0.9)
Total Bilirubin: 0.7 mg/dL (ref 0.3–1.2)
Total Protein: 4.6 g/dL — ABNORMAL LOW (ref 6.5–8.1)

## 2023-08-08 LAB — BASIC METABOLIC PANEL
Anion gap: 7 (ref 5–15)
BUN: 17 mg/dL (ref 8–23)
CO2: 33 mmol/L — ABNORMAL HIGH (ref 22–32)
Calcium: 8 mg/dL — ABNORMAL LOW (ref 8.9–10.3)
Chloride: 103 mmol/L (ref 98–111)
Creatinine, Ser: 0.99 mg/dL (ref 0.44–1.00)
GFR, Estimated: >60 mL/min (ref >60)
Glucose, Bld: 86 mg/dL (ref 70–99)
Potassium: 3.6 mmol/L (ref 3.5–5.1)
Sodium: 143 mmol/L (ref 135–145)

## 2023-08-08 LAB — LACTIC ACID, PLASMA: Lactic Acid, Venous: 0.7 mmol/L (ref 0.5–1.9)

## 2023-08-08 LAB — BRAIN NATRIURETIC PEPTIDE: B Natriuretic Peptide: 374.2 pg/mL — ABNORMAL HIGH (ref 0.0–100.0)

## 2023-08-08 LAB — TROPONIN I (HIGH SENSITIVITY): Troponin I (High Sensitivity): 40 ng/L — ABNORMAL HIGH (ref <18)

## 2023-08-08 LAB — HEPARIN LEVEL (UNFRACTIONATED): Heparin Unfractionated: 0.52 IU/mL (ref 0.30–0.70)

## 2023-08-08 MED ORDER — METOCLOPRAMIDE HCL 5 MG/ML IJ SOLN
10.0000 mg | Freq: Four times a day (QID) | INTRAMUSCULAR | Status: DC | PRN
  Administered 2023-08-08 – 2023-08-10 (×3): 10 mg via INTRAVENOUS
  Filled 2023-08-08 (×3): qty 2

## 2023-08-08 MED ORDER — POTASSIUM CHLORIDE CRYS ER 20 MEQ PO TBCR
40.0000 meq | EXTENDED_RELEASE_TABLET | Freq: Once | ORAL | Status: AC
  Administered 2023-08-08: 40 meq via ORAL
  Filled 2023-08-08: qty 2

## 2023-08-08 MED ORDER — BOOST / RESOURCE BREEZE PO LIQD CUSTOM
1.0000 | Freq: Three times a day (TID) | ORAL | Status: DC
  Administered 2023-08-08 – 2023-08-09 (×3): 1 via ORAL

## 2023-08-08 NOTE — Progress Notes (Signed)
ANTICOAGULATION CONSULT NOTE - Follow Up Consult  Pharmacy Consult for Heparin IV Indication: pulmonary embolus  Allergies  Allergen Reactions   Actos [Pioglitazone] Other (See Comments)    REACTION: pt states INTOL w/ edema   Codeine Other (See Comments)    REACTION: vomiting   Januvia [Sitagliptin] Nausea Only   Lactose Intolerance (Gi) Diarrhea   Morphine Nausea Only and Nausea And Vomiting    REACTION: vomiting REACTION: vomiting   Repatha [Evolocumab]     WAS NOT EFFECTIVE IN LOWERING LDL    Patient Measurements: Height: 5\' 2"  (157.5 cm) Weight: 78.7 kg (173 lb 8 oz) IBW/kg (Calculated) : 50.1 Heparin Dosing Weight: 64.5 kg  Vital Signs: Temp: 98.8 F (37.1 C) (09/23 0600) BP: 127/66 (09/23 0600) Pulse Rate: 69 (09/23 0600)  Labs: Recent Labs    08/06/23 0630 08/06/23 0742 08/06/23 1252 08/06/23 2204 08/07/23 0428 08/07/23 0552 08/07/23 1549 08/08/23 0345  HGB 4.9*   < > 8.2*  --  7.5*  --   --  7.4*  HCT 14.5*   < > 23.2*  --  21.9*  --   --  22.8*  PLT 100*  --  128*  --  114*  --   --  135*  HEPARINUNFRC  --   --   --    < >  --  0.26* 0.46 0.52  CREATININE 1.58*  --   --   --  1.22*  --   --  0.99   < > = values in this interval not displayed.    Estimated Creatinine Clearance: 52.8 mL/min (by C-G formula based on SCr of 0.99 mg/dL).   Medications:  Scheduled:   aspirin EC  81 mg Oral Daily   azaTHIOprine  100 mg Oral Daily   Chlorhexidine Gluconate Cloth  6 each Topical Daily   docusate sodium  100 mg Oral BID   famotidine  20 mg Oral Daily   insulin aspart  0-20 Units Subcutaneous Q4H   insulin detemir  8 Units Subcutaneous BID   methylPREDNISolone  4 mg Oral Daily   polyethylene glycol  17 g Oral Daily   Infusions:   sodium chloride     sodium chloride Stopped (08/06/23 0422)   heparin 800 Units/hr (08/08/23 0600)    Assessment: 67 years of age female with saddle PE initially on IV Heparin then coded and received TNK at 2300 PM on  9/18.  Heparin level originally therapeutic (0.51) while on heparin 600 units/hr. Hgb dropped and heparin held for a few hours. Restarted at previous rate on 9/21.  Heparin level 0.52 units/mL (therapeutic) on heparin 800 units/hr. CBC low but stable, small amount of bleeding from the nose overnight per RN but not saturated and not actively bleeding.   Goal of Therapy:  Heparin level 0.3-0.7 units/ml  Monitor platelets by anticoagulation protocol: Yes   Plan:  Continue heparin 800 units/hr Daily heparin level and CBC while on heparin Continue to monitor for s/x of bleeding  F/u plans for transitioning off heparin  Thank you for involving pharmacy in the patient's care.   Theotis Burrow, PharmD PGY1 Acute Care Pharmacy Resident  08/08/2023 9:07 AM

## 2023-08-08 NOTE — Telephone Encounter (Signed)
That is fine 

## 2023-08-08 NOTE — Progress Notes (Signed)
PROGRESS NOTE    Jocelyn [name]  WUJ:[id number] DOB: December 14, 1955 DOA: 08/03/2023 PCP: Renaye Rakers, MD   Brief Narrative:  67 year old female with history of CAD with inferior wall STEMI in 06/2017 status post DES to RCA, hyperlipidemia, polymyositis on azathioprine and steroids, sarcoidosis, hypertension, diabetes mellitus type 2, obesity and OSA was admitted to ICU under PCCM service for worsening shortness of breath/presyncope and was found to have hypothermia/acute respiratory failure with hypoxia, lactic acidosis with CT angiogram showing saddle pulmonary embolism as well as extensive bilateral emboli.  Subsequently she had PEA arrest requiring CPR and epinephrine and intubation.  She was given TNK.  Underwent IR thrombectomy on 08/03/2023.  Needed IV heparin and vasopressors.  On 08/04/2023, there was a concern for seizure-like activity but EEG was negative.  Neurology was consulted.  CT head was negative for any acute intracranial process.  Extubated on 08/05/2023.  MRI of brain showed small meningioma, most likely incidental: Neurology signed off and did not recommend antiseizure medications and recommended no driving for 6 months.  Initial point-of-care ultrasound had shown EF of 30 to 35%: Heart failure team was consulted: 2D echo subsequently showed EF of 60 to 65%.  Pressors were gradually weaned off.  Received 1 unit of packed red cell transfusion on 08/06/2023.  Care was transferred to [facility] service from 08/08/2023 onwards.  Assessment & Plan:   Massive pulmonary embolism with cor pulmonale Cardiogenic shock Status post PEA arrest -had PEA arrest requiring CPR and epinephrine and intubation.  She was given TNK.  Underwent IR thrombectomy on 08/03/2023.  Needed IV heparin and vasopressors.  -Vasopressors and milrinone have already been weaned off. -Care was transferred to [facility] service from 08/08/2023 onwards.  PCCM on 08/07/2023 and recommended heparin drip for another 24 to 48 hours followed by  DOAC full dose for 1 to 2 years.  Will need outpatient pulmonary follow-up. -Continue heparin drip at least for today. -Blood pressure has stabilized. Initial point-of-care ultrasound had shown EF of 30 to 35%: Heart failure team was consulted: 2D echo subsequently showed EF of 60 to 65%.    Acute respiratory failure with hypoxia -Extubated on 08/05/2023. -Currently on 2 L oxygen via nasal cannula.  Wean off as able.  Might need supplemental oxygen on discharge.  Shock liver/hepatic congestion/elevated LFTs -LFTs are improving plan monitor  Seizure-like activity -There was a concern for seizure-like activity on 924.  EEG negative.  MRI of brain showed small meningioma. -Neurology signed off and did not recommend antiseizure medications and recommended no driving for 6 months.    History of CAD s/p DES to RCA in 2018 -Cardiology following.  Continue aspirin  AKI -resolved.  Anemia of chronic disease/macrocytosis -Required 1 unit of packed red cell transfusion on 08/06/2023 for hemoglobin of 4.9.  Hemoglobin 7.4 this morning.  Monitor.  Thrombocytopenia -Questionable cause; improving.  Monitor  Leukocytosis -Resolved  Hyponatremia -Resolved  Hypokalemia -Resolved  Diabetes mellitus type 2 with hyperglycemia -Possibly from steroid use.  Suicide much improved and currently on the lower side.  DC long-acting insulin.  Continue CBGs with SSI  Hypomagnesemia -improved  Physical deconditioning -PT/OT recommending home health PT/OT  Obesity -Outpatient follow-up    DVT prophylaxis: Heparin drip Code Status: Full Family Communication: Daughter and son-in-law at bedside Disposition Plan: Status is: Inpatient Remains inpatient appropriate because: Of severity of illness    Consultants: PCCM/IR/neurology/heart failure  Procedures: As above  Antimicrobials: None   Subjective: Patient seen and examined at bedside.  Has some  tightness in chest.  Denies any worsening  shortness with no fever, nausea or vomiting.  Feels weak.  Objective: Vitals:   08/08/23 0300 08/08/23 0400 08/08/23 0500 08/08/23 0600  BP: 124/67 (!) 147/70 130/61 127/66  Pulse: 76 85 82 69  Resp: 16 18 15 12   Temp: 99 F (37.2 C) 98.8 F (37.1 C) 98.8 F (37.1 C) 98.8 F (37.1 C)  TempSrc:      SpO2: 97% 97% 98% 98%  Weight:      Height:        Intake/Output Summary (Last 24 hours) at 08/08/2023 0926 Last data filed at 08/08/2023 0600 Gross per 24 hour  Intake 172.14 ml  Output 810 ml  Net -637.86 ml   Filed Weights   08/05/23 0425 08/06/23 0500 08/07/23 0439  Weight: 77.2 kg 80.7 kg 78.7 kg    Examination:  General exam: Appears calm and comfortable.  On 2 L oxygen by nasal cannula. Respiratory system: Bilateral decreased breath sounds at bases with scattered crackles Cardiovascular system: S1 & S2 heard, Rate controlled Gastrointestinal system: Abdomen is nondistended, soft and nontender. Normal bowel sounds heard. Extremities: No cyanosis, clubbing; trace lower extremity edema  Central nervous system: Alert and oriented.  Slow to respond.  No focal neurological deficits. Moving extremities Skin: No rashes, lesions or ulcers Psychiatry: Flat affect.  Agitated.   Data Reviewed: I have personally reviewed following labs and imaging studies  CBC: Recent Labs  Lab 08/03/23 1954 08/03/23 2359 08/05/23 0601 08/05/23 0756 08/06/23 0630 08/06/23 0742 08/06/23 1252 08/07/23 0428 08/08/23 0345  WBC 6.7   < > 9.2  --  6.2  --  7.2 6.4 7.3  NEUTROABS 4.8  --   --   --   --   --   --   --   --   HGB 11.7*   < > 7.2*   < > 4.9* 6.8* 8.2* 7.5* 7.4*  HCT 35.9*   < > 21.1*   < > 14.5* 19.5* 23.2* 21.9* 22.8*  MCV 108.5*   < > 101.9*  --  105.8*  --  97.1 99.1 105.1*  PLT 220   < > 166  --  100*  --  128* 114* 135*   < > = values in this interval not displayed.   Basic Metabolic Panel: Recent Labs  Lab 08/04/23 0249 08/04/23 0522 08/05/23 0424 08/05/23 0756  08/05/23 1616 08/05/23 2028 08/06/23 0630 08/07/23 0428 08/08/23 0345  NA 141   < > 135 132*  --   --  134* 142 143  K 5.2*   < > 3.4* 3.6  --   --  3.5 4.0 3.6  CL 108  --  97*  --   --   --  99 103 103  CO2 9*  --  24  --   --   --  24 29 33*  GLUCOSE 212*  --  205*  --   --   --  169* 127* 86  BUN 31*  --  34*  --   --   --  33* 25* 17  CREATININE 1.82*  --  1.69*  --   --   --  1.58* 1.22* 0.99  CALCIUM 8.2*  --  7.7*  --   --   --  7.6* 8.0* 8.0*  MG 1.7   < > 1.1*  --  2.9* 2.9* 2.7* 2.9* 2.7*  PHOS 7.5*   < > 3.9  --  3.7  --  3.2 3.3 3.3   < > = values in this interval not displayed.   GFR: Estimated Creatinine Clearance: 52.8 mL/min (by C-G formula based on SCr of 0.99 mg/dL). Liver Function Tests: Recent Labs  Lab 08/05/23 0424 08/06/23 1359 08/07/23 0428 08/08/23 0345  AST 503* 113* 72* 43*  ALT 208* 116* 91* 65*  ALKPHOS 51 51 45 43  BILITOT 0.9 1.1 1.1 0.7  PROT 4.5* 5.1* 4.7* 4.6*  ALBUMIN 2.4* 2.6* 2.4* 2.3*   No results for input(s): "LIPASE", "AMYLASE" in the last 168 hours. No results for input(s): "AMMONIA" in the last 168 hours. Coagulation Profile: Recent Labs  Lab 08/03/23 2359  INR 2.1*   Cardiac Enzymes: No results for input(s): "CKTOTAL", "CKMB", "CKMBINDEX", "TROPONINI" in the last 168 hours. BNP (last 3 results) No results for input(s): "PROBNP" in the last 8760 hours. HbA1C: No results for input(s): "HGBA1C" in the last 72 hours. CBG: Recent Labs  Lab 08/07/23 1459 08/07/23 1910 08/07/23 2318 08/08/23 0343 08/08/23 0727  GLUCAP 181* 132* 102* 93 84   Lipid Profile: No results for input(s): "CHOL", "HDL", "LDLCALC", "TRIG", "CHOLHDL", "LDLDIRECT" in the last 72 hours. Thyroid Function Tests: No results for input(s): "TSH", "T4TOTAL", "FREET4", "T3FREE", "THYROIDAB" in the last 72 hours. Anemia Panel: No results for input(s): "VITAMINB12", "FOLATE", "FERRITIN", "TIBC", "IRON", "RETICCTPCT" in the last 72 hours. Sepsis  Labs: Recent Labs  Lab 08/03/23 2359 08/04/23 1126 08/05/23 0812 08/08/23 0610  LATICACIDVEN >9.0* >9.0* 1.1 0.7    Recent Results (from the past 240 hour(s))  Resp panel by RT-PCR (RSV, Flu A&B, Covid) Anterior Nasal Swab     Status: None   Collection Time: 08/03/23  7:54 PM   Specimen: Anterior Nasal Swab  Result Value Ref Range Status   SARS Coronavirus 2 by RT PCR NEGATIVE NEGATIVE Final    Comment: (NOTE) SARS-CoV-2 target nucleic acids are NOT DETECTED.  The SARS-CoV-2 RNA is generally detectable in upper respiratory specimens during the acute phase of infection. The lowest concentration of SARS-CoV-2 viral copies this assay can detect is 138 copies/mL. A negative result does not preclude SARS-Cov-2 infection and should not be used as the sole basis for treatment or other patient management decisions. A negative result may occur with  improper specimen collection/handling, submission of specimen other than nasopharyngeal swab, presence of viral mutation(s) within the areas targeted by this assay, and inadequate number of viral copies(<138 copies/mL). A negative result must be combined with clinical observations, patient history, and epidemiological information. The expected result is Negative.  Fact Sheet for Patients:  BloggerCourse.com  Fact Sheet for Healthcare Providers:  SeriousBroker.it  This test is no t yet approved or cleared by the [place] FDA and  has been authorized for detection and/or diagnosis of SARS-CoV-2 by FDA under an Emergency Use Authorization (EUA). This EUA will remain  in effect (meaning this test can be used) for the duration of the COVID-19 declaration under Section 564(b)(1) of the Act, 21 U.S.C.section 360bbb-3(b)(1), unless the authorization is terminated  or revoked sooner.       Influenza A by PCR NEGATIVE NEGATIVE Final   Influenza B by PCR NEGATIVE NEGATIVE Final     Comment: (NOTE) The Xpert Xpress SARS-CoV-2/FLU/RSV plus assay is intended as an aid in the diagnosis of influenza from Nasopharyngeal swab specimens and should not be used as a sole basis for treatment. Nasal washings and aspirates are unacceptable for Xpert Xpress SARS-CoV-2/FLU/RSV testing.  Fact Sheet for Patients:  BloggerCourse.com  Fact Sheet for Healthcare Providers: SeriousBroker.it  This test is not yet approved or cleared by the [place] FDA and has been authorized for detection and/or diagnosis of SARS-CoV-2 by FDA under an Emergency Use Authorization (EUA). This EUA will remain in effect (meaning this test can be used) for the duration of the COVID-19 declaration under Section 564(b)(1) of the Act, 21 U.S.C. section 360bbb-3(b)(1), unless the authorization is terminated or revoked.     Resp Syncytial Virus by PCR NEGATIVE NEGATIVE Final    Comment: (NOTE) Fact Sheet for Patients: BloggerCourse.com  Fact Sheet for Healthcare Providers: SeriousBroker.it  This test is not yet approved or cleared by the [place] FDA and has been authorized for detection and/or diagnosis of SARS-CoV-2 by FDA under an Emergency Use Authorization (EUA). This EUA will remain in effect (meaning this test can be used) for the duration of the COVID-19 declaration under Section 564(b)(1) of the Act, 21 U.S.C. section 360bbb-3(b)(1), unless the authorization is terminated or revoked.  Performed at [facility], [address], [place], [place] [address]   Blood culture (routine x 2)     Status: None (Preliminary result)   Collection Time: 08/03/23  8:03 PM   Specimen: BLOOD  Result Value Ref Range Status   Specimen Description   Final    BLOOD RIGHT ANTECUBITAL Performed at [facility], [address], [place], [place] [address]    Special  Requests   Final    BOTTLES DRAWN AEROBIC AND ANAEROBIC Blood Culture adequate volume Performed at [facility], [address], [place], [place] [address]    Culture   Final    NO GROWTH 4 DAYS Performed at [facility], [address], [place], [place] [address]    Report Status PENDING  Incomplete  Blood culture (routine x 2)     Status: None (Preliminary result)   Collection Time: 08/03/23  8:04 PM   Specimen: BLOOD  Result Value Ref Range Status   Specimen Description   Final    BLOOD LEFT ANTECUBITAL Performed at [facility], [address] [place], [place] [address]    Special Requests   Final    BOTTLES DRAWN AEROBIC AND ANAEROBIC Blood Culture adequate volume Performed at [facility], [address] [place], [place] [address]    Culture   Final    NO GROWTH 4 DAYS Performed at [facility], [address] [place], [place] [address]    Report Status PENDING  Incomplete  MRSA Next Gen by PCR, Nasal     Status: None   Collection Time: 08/03/23 10:54 PM   Specimen: Nasal Mucosa; Nasal Swab  Result Value Ref Range Status   MRSA by PCR Next Gen NOT DETECTED NOT DETECTED Final    Comment: (NOTE) The GeneXpert MRSA Assay (FDA approved for NASAL specimens only), is one component of a comprehensive MRSA colonization surveillance program. It is not intended to diagnose MRSA infection nor to guide or monitor treatment for MRSA infections. Test performance is not FDA approved in patients less than 34 years old. Performed at [facility], [address], [place], [place] [address]          Radiology Studies: No results found.      Scheduled Meds:  aspirin EC  81 mg Oral Daily   azaTHIOprine  100 mg Oral Daily   Chlorhexidine Gluconate Cloth  6 each Topical Daily   docusate sodium  100 mg Oral BID   famotidine  20 mg Oral Daily   insulin aspart  0-20 Units Subcutaneous Q4H    insulin detemir  8 Units Subcutaneous BID   methylPREDNISolone  4 mg Oral Daily   polyethylene glycol  17 g Oral Daily   Continuous Infusions:  sodium chloride     sodium chloride Stopped (08/06/23 0422)   heparin 800 Units/hr (08/08/23 0600)          Glade Lloyd, MD [facility] 08/08/2023, 9:26 AM

## 2023-08-08 NOTE — Progress Notes (Signed)
Physical Therapy Treatment Patient Details Name: Jocelyn [name] MRN: [id number] DOB: Dec 29, 1955 Today's Date: 08/08/2023   History of Present Illness Pt is a 67 y/o female who presents to the ED 08/03/23 with HTN, hypothermia and acute respiratory failure. CT revealed saddle pulm embolism as well as extensive bilateral emboli. Soon after arrival to ICU, she developed PEA arrest with ROSC after 5 minutes of CPR, intubated post arrest, extubated 9/20. MRI revealed a 6 mm meningioma along the L frontal convexity as an incidental finding. PMH significant for Anxiety, HTN, fibromyalgia, HSV-2, Meniere's disease, MI, DM II.    PT Comments  Pt greeted resting in bed and agreeable to session, despite c/o nausea pt demonstrating continued progress towards acute goals. Pt with continued flat affect throughout session despite multiple family and friends at bedside. Pt needing grossly min A for bed mobility, CGA for transfers and gait with RW for support, however pt with x1 mild LOB needing min A to correct and regain standing balance. Pt needing cues throughout gait for direction as pt with tendency to veer L needing cues for awareness and to correct. Pt able to maintain SpO2 >90% on 1L throughout mobility, HR up to 125bpm with gait. Pt continues to benefit from skilled PT services to progress toward functional mobility goals.      If plan is discharge home, recommend the following: A little help with walking and/or transfers;A little help with bathing/dressing/bathroom;Assistance with cooking/housework;Assist for transportation;Help with stairs or ramp for entrance   Can travel by private vehicle        Equipment Recommendations  Rolling walker (2 wheels)    Recommendations for Other Services       Precautions / Restrictions Precautions Precautions: Fall Precaution Comments: Watch O2 & HR Restrictions Weight Bearing Restrictions: No     Mobility  Bed Mobility Overal bed mobility: Needs  Assistance Bed Mobility: Supine to Sit, Sit to Supine     Supine to sit: Min assist Sit to supine: Min assist   General bed mobility comments: Assist for trunk elevation to full sitting position. Pt able to initiate movement and with good scoot out to EOB to get feet on the floor.    Transfers Overall transfer level: Needs assistance Equipment used: Rolling walker (2 wheels) Transfers: Sit to/from Stand Sit to Stand: Contact guard assist, Min assist           General transfer comment: VC's for hand placement, once standing, pt required cues to reach for RW for support as she appeared unsteady and did not initiate on her own.    Ambulation/Gait Ambulation/Gait assistance: Contact guard assist, Min assist Gait Distance (Feet): 175 Feet Assistive device: Rolling walker (2 wheels) Gait Pattern/deviations: Step-through pattern, Decreased stride length, Trunk flexed, Drifts right/left Gait velocity: Decreased     General Gait Details: Slow but generally steady with RW for support. x1 small LOB with turning to L needing min A to regain balance, HR up to 125bpm with gait, cues for breathing techniques thrughout   Stairs             Wheelchair Mobility     Tilt Bed    Modified Rankin (Stroke Patients Only)       Balance Overall balance assessment: Needs assistance Sitting-balance support: No upper extremity supported, Feet supported Sitting balance-Leahy Scale: Fair     Standing balance support: Bilateral upper extremity supported, During functional activity, Reliant on assistive device for balance Standing balance-Leahy Scale: Fair Standing balance comment: Statically, able  to stand without UE support but appears slightly unsteady - improved with time by end of session.                            Cognition Arousal: Alert Behavior During Therapy: Flat affect Overall Cognitive Status: Impaired/Different from baseline Area of Impairment: Following  commands, Safety/judgement, Problem solving, Awareness                       Following Commands: Follows one step commands inconsistently Safety/Judgement: Decreased awareness of safety, Decreased awareness of deficits Awareness: Emergent Problem Solving: Slow processing, Decreased initiation, Difficulty sequencing, Requires verbal cues General Comments: Pt with flat affect and vague responses to questions at times.        Exercises      General Comments General comments (skin integrity, edema, etc.): SpO2 >90% on 1L throughout gait, HR up to 125bpm with gait      Pertinent Vitals/Pain Pain Assessment Pain Assessment: Faces Faces Pain Scale: Hurts a little bit Pain Location: nausea Pain Descriptors / Indicators: Other (Comment) (nausea) Pain Intervention(s): Monitored during session, Limited activity within patient's tolerance    Home Living                          Prior Function            PT Goals (current goals can now be found in the care plan section) Acute Rehab PT Goals Patient Stated Goal: None stated PT Goal Formulation: Patient unable to participate in goal setting Time For Goal Achievement: 08/21/23 Progress towards PT goals: Progressing toward goals    Frequency    Min 1X/week      PT Plan      Co-evaluation              AM-PAC PT "6 Clicks" Mobility   Outcome Measure  Help needed turning from your back to your side while in a flat bed without using bedrails?: A Little Help needed moving from lying on your back to sitting on the side of a flat bed without using bedrails?: A Little Help needed moving to and from a bed to a chair (including a wheelchair)?: A Little Help needed standing up from a chair using your arms (e.g., wheelchair or bedside chair)?: A Little Help needed to walk in hospital room?: A Little Help needed climbing 3-5 steps with a railing? : A Little 6 Click Score: 18    End of Session Equipment  Utilized During Treatment: Gait belt;Oxygen Activity Tolerance: Patient tolerated treatment well Patient left: with call bell/phone within reach;with chair alarm set;with family/visitor present;in bed;with nursing/sitter in room Nurse Communication: Mobility status PT Visit Diagnosis: Unsteadiness on feet (R26.81);Difficulty in walking, not elsewhere classified (R26.2)     Time: 0938-1829 PT Time Calculation (min) (ACUTE ONLY): 27 min  Charges:    $Gait Training: 8-22 mins $Therapeutic Activity: 8-22 mins PT General Charges $$ ACUTE PT VISIT: 1 Visit                     Zelpha Messing R. PTA Acute Rehabilitation Services Office: [phone]   Catalina Antigua 08/08/2023, 4:03 PM

## 2023-08-08 NOTE — Progress Notes (Addendum)
Advanced Heart Failure Rounding Note  PCP-Cardiologist: Lance Muss, MD   Subjective:   RV failure with shock from large saddle embolus.  S/p thrombectomy and started on pressors/inotropes for RV shock 9/19.   No co-ox this morning. Stable off pressors and inotropic support.   Daughter at bedside, feels ok just complaining of nausea. Has not been able to tolerate much PO intake.   Objective:   Weight Range: 78.7 kg Body mass index is 31.73 kg/m.   Vital Signs:   Temp:  [97.9 F (36.6 C)-99.3 F (37.4 C)] 98.6 F (37 C) (09/23 1000) Pulse Rate:  [69-97] 82 (09/23 1000) Resp:  [12-26] 19 (09/23 1000) BP: (111-152)/(55-78) 132/74 (09/23 1000) SpO2:  [91 %-100 %] 98 % (09/23 1000) Last BM Date :  (pta)  Weight change: Filed Weights   08/05/23 0425 08/06/23 0500 08/07/23 0439  Weight: 77.2 kg 80.7 kg 78.7 kg    Intake/Output:   Intake/Output Summary (Last 24 hours) at 08/08/2023 1119 Last data filed at 08/08/2023 1000 Gross per 24 hour  Intake 181.27 ml  Output 1310 ml  Net -1128.73 ml    Physical Exam  General:  well appearing.  No respiratory difficulty HEENT: normal Neck: supple. JVD ~8 cm. Carotids 2+ bilat; no bruits. No lymphadenopathy or thyromegaly appreciated. LIJ CVC Cor: PMI nondisplaced. Regular rate & rhythm. No rubs, gallops or murmurs. Lungs: clear Abdomen: soft, nontender, nondistended. No hepatosplenomegaly. No bruits or masses. Good bowel sounds. Extremities: no cyanosis, clubbing, rash, edema  Neuro: alert & oriented x 3, cranial nerves grossly intact. moves all 4 extremities w/o difficulty. Affect pleasant.   Telemetry   NSR 90s (Personally reviewed)    EKG    No new EKG to review  Labs    CBC Recent Labs    08/07/23 0428 08/08/23 0345  WBC 6.4 7.3  HGB 7.5* 7.4*  HCT 21.9* 22.8*  MCV 99.1 105.1*  PLT 114* 135*   Basic Metabolic Panel Recent Labs    95/28/41 0428 08/08/23 0345  NA 142 143  K 4.0 3.6  CL 103 103   CO2 29 33*  GLUCOSE 127* 86  BUN 25* 17  CREATININE 1.22* 0.99  CALCIUM 8.0* 8.0*  MG 2.9* 2.7*  PHOS 3.3 3.3   Liver Function Tests Recent Labs    08/07/23 0428 08/08/23 0345  AST 72* 43*  ALT 91* 65*  ALKPHOS 45 43  BILITOT 1.1 0.7  PROT 4.7* 4.6*  ALBUMIN 2.4* 2.3*   No results for input(s): "LIPASE", "AMYLASE" in the last 72 hours. Cardiac Enzymes No results for input(s): "CKTOTAL", "CKMB", "CKMBINDEX", "TROPONINI" in the last 72 hours.  BNP: BNP (last 3 results) Recent Labs    08/08/23 0345  BNP 374.2*    ProBNP (last 3 results) No results for input(s): "PROBNP" in the last 8760 hours.   D-Dimer No results for input(s): "DDIMER" in the last 72 hours. Hemoglobin A1C No results for input(s): "HGBA1C" in the last 72 hours. Fasting Lipid Panel No results for input(s): "CHOL", "HDL", "LDLCALC", "TRIG", "CHOLHDL", "LDLDIRECT" in the last 72 hours. Thyroid Function Tests No results for input(s): "TSH", "T4TOTAL", "T3FREE", "THYROIDAB" in the last 72 hours.  Invalid input(s): "FREET3"  Other results:   Imaging    No results found.   Medications:     Scheduled Medications:  aspirin EC  81 mg Oral Daily   azaTHIOprine  100 mg Oral Daily   Chlorhexidine Gluconate Cloth  6 each Topical Daily  docusate sodium  100 mg Oral BID   famotidine  20 mg Oral Daily   insulin aspart  0-20 Units Subcutaneous Q4H   methylPREDNISolone  4 mg Oral Daily   polyethylene glycol  17 g Oral Daily    Infusions:  sodium chloride     sodium chloride Stopped (08/06/23 0422)   heparin 800 Units/hr (08/08/23 1000)    PRN Medications: sodium chloride, metoCLOPramide (REGLAN) injection, ondansetron (ZOFRAN) IV, mouth rinse, phenol    Patient Profile   67 y/o female w/ h/o CAD s/p inferior STEMI in 2018 treated w/ PCI to distal RCA, admitted w/ acute hypoxic respiratory failure due to large PE, progressed to obstructive shock, PEA arrest and subsequent RV failure.    Assessment/Plan   1. PE: Large, hemodynamically significant saddle embolus. Had PEA arrest. Had thrombolytics then mechanical aspiration by IR.  Improving hemodynamically.  Trigger for PE uncertain, ambulation limited by knee pain but not sedentary.  No long trips, no known cancer, etc.  Does not know family history (adopted).  - Heparin gtt for now, transition to Eliquis eventually.  - Hypercoagulable workup, will send the Factor V Leiden, prothrombin gene mutation, and lupus anticoagulant panel now.   2. Shock: RV failure with shock from large saddle embolus.  S/p thrombectomy and started on pressors/inotropes for RV shock.  Stable off pressors and inotropic support.  No co-ox this morning, lactate has cleared. CVP unhooked.   - Volume appears stable. Can use lasix 20 mg daily PRN. Goal CVP ~10 with RV failure.   3. AKI: Creatinine now trending down, .99 today.  Suspect rise due to shock. Peaked at 1.82 9/19  4. Elevated LFTs: Suspect shock liver.  Follow. Improving.   5. CAD: H/o inferior MI in 8/18 with DES to RCA.  Cardiolite in 9/24 showed no ischemia/infarction.  - She has been on Praluent at home for lipids, resume at discharge.  - She was on Plavix at home, will stop this as she will be discharged this admission on DOAC.   Patient stable from cardiac stand point. AHF team will now sign off. Please reach out to AHF team if we need to see again.   Length of Stay: 5  Alen Bleacher, NP  08/08/2023, 11:19 AM  Advanced Heart Failure Team Pager [phone] (M-F; 7a - 5p)  Please contact [facility] for night-coverage after hours (5p -7a ) and weekends on [email or web address]  Patient seen and examined with the above-signed Advanced Practice Provider and/or Housestaff. I personally reviewed laboratory data, imaging studies and relevant notes. I independently examined the patient and formulated the important aspects of the plan. I have edited the note to reflect any of my changes or salient  points. I have personally discussed the plan with the patient and/or family.  C/o mild nausea. Remains on heparin. No CP or SOB. BP improved but still mildly tachycardic  General: Sitting up in bed No resp difficulty HEENT: normal Neck: supple. JVP to jaw  Carotids 2+ bilat; no bruits. No lymphadenopathy or thryomegaly appreciated. Cor: Regular mildly tachy  Lungs: basilar crackles Abdomen: soft, nontender, nondistended. No hepatosplenomegaly. No bruits or masses. Good bowel sounds. Extremities: no cyanosis, clubbing, rash, edema Neuro: alert & orientedx3, cranial nerves grossly intact. moves all 4 extremities w/o difficulty. Affect pleasant  Clinically improved but still mildly tachy. Will repeat limited echo to reassess RV function. Ok to switch to Eliquis from my standpoint. Will need lifelong AC.   Mild basilar crackles on exam. Encouraged  IS  We will follow at a distance for now. Call with questions.  Arvilla Meres, MD  7:56 PM

## 2023-08-08 NOTE — Progress Notes (Incomplete)
Advanced Heart Failure Rounding Note  PCP-Cardiologist: Lance Muss, MD   Subjective:     ***   Objective:   Weight Range: 78.7 kg Body mass index is 31.73 kg/m.   Vital Signs:   Temp:  [97.9 F (36.6 C)-99.3 F (37.4 C)] 98.8 F (37.1 C) (09/23 0600) Pulse Rate:  [69-97] 69 (09/23 0600) Resp:  [11-23] 12 (09/23 0600) BP: (110-151)/(59-77) 127/66 (09/23 0600) SpO2:  [91 %-100 %] 98 % (09/23 0600) Last BM Date :  (pta)  Weight change: Filed Weights   08/05/23 0425 08/06/23 0500 08/07/23 0439  Weight: 77.2 kg 80.7 kg 78.7 kg    Intake/Output:   Intake/Output Summary (Last 24 hours) at 08/08/2023 1025 Last data filed at 08/08/2023 0600 Gross per 24 hour  Intake 172.14 ml  Output 810 ml  Net -637.86 ml      Physical Exam    General:  Well appearing. No resp difficulty HEENT: Normal Neck: Supple. JVP . Carotids 2+ bilat; no bruits. No lymphadenopathy or thyromegaly appreciated. Cor: PMI nondisplaced. Regular rate & rhythm. No rubs, gallops or murmurs. Lungs: Clear Abdomen: Soft, nontender, nondistended. No hepatosplenomegaly. No bruits or masses. Good bowel sounds. Extremities: No cyanosis, clubbing, rash, edema Neuro: Alert & orientedx3, cranial nerves grossly intact. moves all 4 extremities w/o difficulty. Affect pleasant   Telemetry   ***  EKG    ***  Labs    CBC Recent Labs    08/07/23 0428 08/08/23 0345  WBC 6.4 7.3  HGB 7.5* 7.4*  HCT 21.9* 22.8*  MCV 99.1 105.1*  PLT 114* 135*   Basic Metabolic Panel Recent Labs    41/32/44 0428 08/08/23 0345  NA 142 143  K 4.0 3.6  CL 103 103  CO2 29 33*  GLUCOSE 127* 86  BUN 25* 17  CREATININE 1.22* 0.99  CALCIUM 8.0* 8.0*  MG 2.9* 2.7*  PHOS 3.3 3.3   Liver Function Tests Recent Labs    08/07/23 0428 08/08/23 0345  AST 72* 43*  ALT 91* 65*  ALKPHOS 45 43  BILITOT 1.1 0.7  PROT 4.7* 4.6*  ALBUMIN 2.4* 2.3*   No results for input(s): "LIPASE", "AMYLASE" in the last  72 hours. Cardiac Enzymes No results for input(s): "CKTOTAL", "CKMB", "CKMBINDEX", "TROPONINI" in the last 72 hours.  BNP: BNP (last 3 results) Recent Labs    08/08/23 0345  BNP 374.2*    ProBNP (last 3 results) No results for input(s): "PROBNP" in the last 8760 hours.   D-Dimer No results for input(s): "DDIMER" in the last 72 hours. Hemoglobin A1C No results for input(s): "HGBA1C" in the last 72 hours. Fasting Lipid Panel No results for input(s): "CHOL", "HDL", "LDLCALC", "TRIG", "CHOLHDL", "LDLDIRECT" in the last 72 hours. Thyroid Function Tests No results for input(s): "TSH", "T4TOTAL", "T3FREE", "THYROIDAB" in the last 72 hours.  Invalid input(s): "FREET3"  Other results:   Imaging    No results found.   Medications:     Scheduled Medications:  aspirin EC  81 mg Oral Daily   azaTHIOprine  100 mg Oral Daily   Chlorhexidine Gluconate Cloth  6 each Topical Daily   docusate sodium  100 mg Oral BID   famotidine  20 mg Oral Daily   insulin aspart  0-20 Units Subcutaneous Q4H   methylPREDNISolone  4 mg Oral Daily   polyethylene glycol  17 g Oral Daily    Infusions:  sodium chloride     sodium chloride Stopped (08/06/23 0422)  heparin 800 Units/hr (08/08/23 0600)    PRN Medications: sodium chloride, ondansetron (ZOFRAN) IV, mouth rinse, phenol    Patient Profile   67 y/o female w/ h/o CAD s/p inferior STEMI in 2018 treated w/ PCI to distal RCA, admitted w/ acute hypoxic respiratory failure due to large PE, progressed to obstructive shock, PEA arrest and subsequent RV failure.   Assessment/Plan   1. PE: Large, hemodynamically significant saddle embolus. Had PEA arrest. Had thrombolytics then mechanical aspiration by IR.  Improving hemodynamically.  Trigger for PE uncertain, ambulation limited by knee pain but not sedentary.  No long trips, no known cancer, etc.  Does not know family history (adopted).  - Heparin gtt for now, transition to Eliquis  eventually.  - Hypercoagulable workup, will send the Factor V Leiden, prothrombin gene mutation, and lupus anticoagulant panel now.   2. Shock: RV failure with shock from large saddle embolus.  S/p thrombectomy and started on pressors/inotropes for RV shock.  Now off vasopressin and trending down on NE to 7.  On milrinone 0.25.  Co-ox 69%, lactate has cleared, and CVP 7 on my read.  - Wean down NE first.  - Will come off milrinone eventually.  - Follow CVP, may eventually need diuresis but would keep CVP at least 10 with RV failure.   3. AKI: Creatinine now trending down, 1.69 today.  Suspect rise due to shock.   4. Elevated LFTs: Suspect shock liver.  Follow.   5. CAD: H/o inferior MI in 8/18 with DES to RCA.  Cardiolite in 9/24 showed no ischemia/infarction.  - She has been on Praluent at home for lipids, resume at discharge.  - She was on Plavix at home, will stop this as she will be discharged this admission on DOAC.      Length of Stay: [address] PA-C  08/08/2023, 10:25 AM  Advanced Heart Failure Team Pager [phone] (M-F; 7a - 5p)  Please contact [facility] for night-coverage after hours (5p -7a ) and weekends on [email or web address]

## 2023-08-08 NOTE — Progress Notes (Signed)
[facility] ADULT ICU REPLACEMENT PROTOCOL   The patient does apply for the [facility] Adult ICU Electrolyte Replacment Protocol based on the criteria listed below:   1.Exclusion criteria: TCTS, ECMO, Dialysis, and Myasthenia Gravis patients 2. Is GFR >/= 30 ml/min? Yes.    Patient's GFR today is >60 3. Is SCr </= 2? Yes.   Patient's SCr is 0.99 mg/dL 4. Did SCr increase >/= 0.5 in 24 hours? No. 5.Pt's weight >40kg  Yes.   6. Abnormal electrolyte(s): K  7. Electrolytes replaced per protocol 8.  Call MD STAT for K+ </= 2.5, Phos </= 1, or Mag </= 1 Physician:  Thersa Salt Ervin Rothbauer 08/08/2023 5:04 AM

## 2023-08-08 NOTE — Progress Notes (Signed)
Nutrition Follow-up  DOCUMENTATION CODES:   Not applicable  INTERVENTION:  Continue regular diet as ordered Boost Breeze po TID, each supplement provides 250 kcal and 9 grams of protein Recommend increasing bowel regimen Monitor for diet tolerance, consider alternative nutrition support if unable to maintain adequate PO intake  NUTRITION DIAGNOSIS:   Inadequate oral intake related to inability to eat as evidenced by NPO status. - ongoing, intake still remains inadequate  GOAL:   Patient will meet greater than or equal to 90% of their needs - goal unmet  MONITOR:   TF tolerance, I & O's, Vent status, Labs  REASON FOR ASSESSMENT:   Ventilator, Consult Enteral/tube feeding initiation and management  ASSESSMENT:   Pt with hx of sarcoidosis, HTN, HLD, DM type 2, IBS, and hx MI presented to ED with worsening SOB. Arrested x 2 upon transfer to [facility] and intubated.  9/18: intubated 9/20: extubated 9/21: diet advanced  Attempted to check in with pt at bedside. At that time her daughter and RN were present providing care as pt experiencing n/v. Pt's daughter states that she hasn't eaten anything since Tuesday. Nausea has been ongoing since extubation however emesis began yesterday.   Meal completions: 9/21: 25% lunch  Pt noted to be lactose intolerant. Will order Boost Breeze for pt once able to better tolerate PO intake. May need to consider alternative nutrition support if PO intake remains limited.    Medications: colace, pepcid, SSI 0-20 units q4h, levemir, medrol, miralax  Labs: Mg 2.7, AST 43, ALT 65, CBG's 84-181 x24 hours  UOP: x24 hours  I/O's: +6591ml since admit  Diet Order:   Diet Order             Diet regular Room service appropriate? Yes; Fluid consistency: Thin  Diet effective now                   EDUCATION NEEDS:   Not appropriate for education at this time  Skin:  Skin Assessment: Reviewed RN Assessment  Last BM:   PTA  Height:   Ht Readings from Last 1 Encounters:  08/03/23 5\' 2"  (1.575 m)    Weight:   Wt Readings from Last 1 Encounters:  08/07/23 78.7 kg    Ideal Body Weight:  50 kg  BMI:  Body mass index is 31.73 kg/m.  Estimated Nutritional Needs:   Kcal:  1500-1700 kcal/d  Protein:  80-95g/d  Fluid:  1.5-1.7L/d  Drusilla Kanner, RDN, LDN Clinical Nutrition

## 2023-08-08 NOTE — TOC Progression Note (Addendum)
Transition of Care [facility]) - Progression Note    Patient Details  Name: Jocelyn [name] MRN: [id number] Date of Birth: 12-05-1955  [facility] CM/SW Contact  Elliot Cousin, RN Phone Number: [phone] 08/08/2023, 4:35 PM  Clinical Narrative:   HF TOC CM spoke to pt's dtr, [name]. Pt will staying with her dtr post dc. Requesting RW with seat for home. Will evaluate if pt will need HH or oxygen for home.     Expected Discharge Plan: Home/Self Care Barriers to Discharge: Continued Medical Work up  Expected Discharge Plan and Services       Living arrangements for the past 2 months: Single Family Home                                       Social Determinants of Health (SDOH) Interventions SDOH Screenings   Food Insecurity: No Food Insecurity (08/05/2023)  Transportation Needs: No Transportation Needs (08/05/2023)  Utilities: Not At Risk (08/05/2023)  Alcohol Screen: Low Risk  (08/05/2023)  Depression (PHQ2-9): Low Risk  (09/03/2020)  Financial Resource Strain: Low Risk  (08/05/2023)  Stress: No Stress Concern Present (08/05/2023)  Tobacco Use: Low Risk  (08/03/2023)    Readmission Risk Interventions     No data to display

## 2023-08-08 NOTE — TOC Benefit Eligibility Note (Signed)
Patient Product/process development scientist completed.    The patient is insured through [employer]. Patient has Medicare and is not eligible for a copay card, but may be able to apply for patient assistance, if available.    Ran test claim for Eliquis 5 mg and the current 30 day co-pay is $0.00.  Ran test claim for Xarelto 20 mg and the current 30 day co-pay is $0.00.   This test claim was processed through [facility]- copay amounts may vary at other pharmacies due to pharmacy/plan contracts, or as the patient moves through the different stages of their insurance plan.     Roland Earl, CPHT Pharmacy Technician III Certified Patient Advocate [facility] Patient Advocate Team Direct Number: [phone]  Fax: [phone]

## 2023-08-09 ENCOUNTER — Inpatient Hospital Stay (HOSPITAL_COMMUNITY): Payer: Medicare PPO

## 2023-08-09 DIAGNOSIS — I2602 Saddle embolus of pulmonary artery with acute cor pulmonale: Secondary | ICD-10-CM | POA: Diagnosis not present

## 2023-08-09 DIAGNOSIS — J9601 Acute respiratory failure with hypoxia: Secondary | ICD-10-CM | POA: Diagnosis not present

## 2023-08-09 LAB — TYPE AND SCREEN
ABO/RH(D): O POS
Antibody Screen: NEGATIVE

## 2023-08-09 LAB — PHOSPHORUS: Phosphorus: 3.2 mg/dL (ref 2.5–4.6)

## 2023-08-09 LAB — CULTURE, BLOOD (ROUTINE X 2)
Culture: NO GROWTH
Culture: NO GROWTH
Special Requests: ADEQUATE
Special Requests: ADEQUATE

## 2023-08-09 LAB — GLUCOSE, CAPILLARY
Glucose-Capillary: 113 mg/dL — ABNORMAL HIGH (ref 70–99)
Glucose-Capillary: 121 mg/dL — ABNORMAL HIGH (ref 70–99)
Glucose-Capillary: 140 mg/dL — ABNORMAL HIGH (ref 70–99)
Glucose-Capillary: 144 mg/dL — ABNORMAL HIGH (ref 70–99)
Glucose-Capillary: 153 mg/dL — ABNORMAL HIGH (ref 70–99)
Glucose-Capillary: 72 mg/dL (ref 70–99)
Glucose-Capillary: 89 mg/dL (ref 70–99)
Glucose-Capillary: 94 mg/dL (ref 70–99)

## 2023-08-09 LAB — COMPREHENSIVE METABOLIC PANEL
ALT: 49 U/L — ABNORMAL HIGH (ref 0–44)
AST: 30 U/L (ref 15–41)
Albumin: 2.5 g/dL — ABNORMAL LOW (ref 3.5–5.0)
Alkaline Phosphatase: 47 U/L (ref 38–126)
Anion gap: 8 (ref 5–15)
BUN: 12 mg/dL (ref 8–23)
CO2: 30 mmol/L (ref 22–32)
Calcium: 8.2 mg/dL — ABNORMAL LOW (ref 8.9–10.3)
Chloride: 102 mmol/L (ref 98–111)
Creatinine, Ser: 0.98 mg/dL (ref 0.44–1.00)
GFR, Estimated: >60 mL/min (ref >60)
Glucose, Bld: 116 mg/dL — ABNORMAL HIGH (ref 70–99)
Potassium: 3.7 mmol/L (ref 3.5–5.1)
Sodium: 140 mmol/L (ref 135–145)
Total Bilirubin: 0.8 mg/dL (ref 0.3–1.2)
Total Protein: 5.1 g/dL — ABNORMAL LOW (ref 6.5–8.1)

## 2023-08-09 LAB — ECHOCARDIOGRAM LIMITED
Height: 62 in
S' Lateral: 2.3 cm
Weight: 2776.03 oz

## 2023-08-09 LAB — PREPARE RBC (CROSSMATCH)

## 2023-08-09 LAB — CBC
HCT: 25.8 % — ABNORMAL LOW (ref 36.0–46.0)
Hemoglobin: 8.1 g/dL — ABNORMAL LOW (ref 12.0–15.0)
MCH: 34 pg (ref 26.0–34.0)
MCHC: 31.4 g/dL (ref 30.0–36.0)
MCV: 108.4 fL — ABNORMAL HIGH (ref 80.0–100.0)
Platelets: 150 10*3/uL (ref 150–400)
RBC: 2.38 MIL/uL — ABNORMAL LOW (ref 3.87–5.11)
RDW: 17 % — ABNORMAL HIGH (ref 11.5–15.5)
WBC: 8.3 10*3/uL (ref 4.0–10.5)
nRBC: 0.6 % — ABNORMAL HIGH (ref 0.0–0.2)

## 2023-08-09 LAB — HEPARIN LEVEL (UNFRACTIONATED): Heparin Unfractionated: 0.44 IU/mL (ref 0.30–0.70)

## 2023-08-09 LAB — MAGNESIUM: Magnesium: 2.1 mg/dL (ref 1.7–2.4)

## 2023-08-09 MED ORDER — PERFLUTREN LIPID MICROSPHERE
1.0000 mL | INTRAVENOUS | Status: AC | PRN
  Administered 2023-08-09: 2 mL via INTRAVENOUS

## 2023-08-09 MED ORDER — MIRABEGRON ER 25 MG PO TB24
25.0000 mg | ORAL_TABLET | Freq: Every day | ORAL | Status: DC
  Administered 2023-08-09 – 2023-08-11 (×3): 25 mg via ORAL
  Filled 2023-08-09 (×3): qty 1

## 2023-08-09 MED ORDER — PANTOPRAZOLE SODIUM 40 MG PO TBEC
40.0000 mg | DELAYED_RELEASE_TABLET | Freq: Every day | ORAL | Status: DC
  Administered 2023-08-09 – 2023-08-11 (×3): 40 mg via ORAL
  Filled 2023-08-09 (×3): qty 1

## 2023-08-09 MED ORDER — SODIUM CHLORIDE 0.9% IV SOLUTION: Freq: Once | INTRAVENOUS | Status: AC

## 2023-08-09 NOTE — TOC Progression Note (Signed)
Transition of Care [facility]) - Progression Note    Patient Details  Name: Jocelyn [name] MRN: [id number] Date of Birth: Jun 24, 1956  [facility]) CM/SW Contact  Nicanor Bake Phone Number: [phone] 08/09/2023, 3:41 PM  Clinical Narrative: HF CSW met with pt and family at bedside. Pts daughter and her husband stated that she is feeling much better today, she up , and moving around. Pt and family stated that at this time they are ok.  TOC will continue following.      Expected Discharge Plan: Home/Self Care Barriers to Discharge: Continued Medical Work up  Expected Discharge Plan and Services       Living arrangements for the past 2 months: Single Family Home                                       Social Determinants of Health (SDOH) Interventions SDOH Screenings   Food Insecurity: No Food Insecurity (08/05/2023)  Transportation Needs: No Transportation Needs (08/05/2023)  Utilities: Not At Risk (08/05/2023)  Alcohol Screen: Low Risk  (08/05/2023)  Depression (PHQ2-9): Low Risk  (09/03/2020)  Financial Resource Strain: Low Risk  (08/05/2023)  Stress: No Stress Concern Present (08/05/2023)  Tobacco Use: Low Risk  (08/03/2023)    Readmission Risk Interventions     No data to display

## 2023-08-09 NOTE — Progress Notes (Signed)
*  PRELIMINARY RESULTS* Echocardiogram 2D Echocardiogram has been performed.  Jocelyn [name] 08/09/2023, 2:47 PM

## 2023-08-09 NOTE — Plan of Care (Signed)

## 2023-08-09 NOTE — Progress Notes (Signed)
Physical Therapy Treatment Patient Details Name: Jocelyn [name] MRN: [id number] DOB: 12-Aug-1956 Today's Date: 08/09/2023   History of Present Illness Pt is a 67 y/o female who presents to the ED 08/03/23 with HTN, hypothermia and acute respiratory failure. CT revealed saddle pulm embolism as well as extensive bilateral emboli. Soon after arrival to ICU, she developed PEA arrest with ROSC after 5 minutes of CPR, intubated post arrest, extubated 9/20. MRI revealed a 6 mm meningioma along the L frontal convexity as an incidental finding. PMH significant for Anxiety, HTN, fibromyalgia, HSV-2, Meniere's disease, MI, DM II.    PT Comments  Pt received in supine, agreeable to therapy session with emphasis on bed mobility, transfer and gait training with rollator. Pt needs min cues for safe UE placement and activity pacing, fairly steady using rollator needing up to CGA for stability/directional navigation. Pt tachy to higher 130's bpm, RN notified, HR improves with brief standing break. SpO2 94% on RA with exertional tasks, reading lower in supine/when resting at 91% but noisy signal at times. Pt continues to benefit from PT services to progress toward functional mobility goals DME updated below per discussion with supervising PT Jerolyn Center and pt/family.    If plan is discharge home, recommend the following: A little help with walking and/or transfers;A little help with bathing/dressing/bathroom;Assistance with cooking/housework;Assist for transportation;Help with stairs or ramp for entrance   Can travel by private vehicle        Equipment Recommendations  Rollator (4 wheels)   Recommendations for Other Services       Precautions / Restrictions Precautions Precautions: Fall Precaution Comments: Watch O2 & HR Restrictions Weight Bearing Restrictions: No     Mobility  Bed Mobility Overal bed mobility: Needs Assistance Bed Mobility: Supine to Sit, Sit to Supine, Rolling Rolling: Supervision    Supine to sit: Min assist, HOB elevated, Used rails Sit to supine: Min assist, Used rails   General bed mobility comments: Assist for trunk elevation to full sitting position. Pt able to initiate movement, uses rails to assist, and cues to scoot out to EOB to get feet on the floor. BLE assist lightly to return to supine.    Transfers Overall transfer level: Needs assistance Equipment used: Rollator (4 wheels) Transfers: Sit to/from Stand Sit to Stand: Contact guard assist           General transfer comment: VC's for hand placement, and use of brakes, to push from bed with single UE and to reach back prior to sitting to avoid plopping. Daughter present and receptive to instruction on guarding/safety cues when assisting her mother.    Ambulation/Gait Ambulation/Gait assistance: Contact guard assist Gait Distance (Feet): 75 Feet Assistive device: Rollator (4 wheels) Gait Pattern/deviations: Step-through pattern, Decreased stride length, Trunk flexed, Drifts right/left Gait velocity: Decreased Gait velocity interpretation: <1.8 ft/sec, indicate of risk for recurrent falls   General Gait Details: Slow but generally steady with 4WW for support. Needs cues for directional navigation in hallway and for activity pacing. HR elevated to higher 130's bpm with exertion and SpO2 reading 94% on RA. HR improves to ~120bpm after standing break ~2 mins and pt returned to room. RN aware of tachycardia. Distance limited due to HR and also tech arriving to perform ECHO study.     Balance Overall balance assessment: Needs assistance Sitting-balance support: No upper extremity supported, Feet supported Sitting balance-Leahy Scale: Fair     Standing balance support: Bilateral upper extremity supported, During functional activity, Reliant on assistive device  for balance Standing balance-Leahy Scale: Fair (with AD) Standing balance comment: needs 1-2 UE support for static standing, BUE support for  dynamic standing tasks.                            Cognition Arousal: Alert Behavior During Therapy: Flat affect Overall Cognitive Status: Impaired/Different from baseline Area of Impairment: Following commands, Safety/judgement, Problem solving, Awareness                       Following Commands: Follows one step commands inconsistently Safety/Judgement: Decreased awareness of safety, Decreased awareness of deficits Awareness: Emergent Problem Solving: Slow processing, Decreased initiation, Difficulty sequencing, Requires verbal cues          Exercises      General Comments General comments (skin integrity, edema, etc.): SpO2 reading 91% in supine but once sitting EOB, reading 92-94% with exertional tasks/gait when good pleth signal achieved on RA. No lightheadedness reported during transfers/gait today. HR 80's bpm resting and to 138 bpm with exertion.      Pertinent Vitals/Pain Pain Assessment Pain Assessment: No/denies pain Pain Intervention(s): Monitored during session, Repositioned    Home Living                          Prior Function            PT Goals (current goals can now be found in the care plan section) Acute Rehab PT Goals Patient Stated Goal: Per family for her to mobilize safely and be able to go home with family assist. PT Goal Formulation: Patient unable to participate in goal setting Time For Goal Achievement: 08/21/23 Progress towards PT goals: Progressing toward goals    Frequency    Min 1X/week      PT Plan      Co-evaluation              AM-PAC PT "6 Clicks" Mobility   Outcome Measure  Help needed turning from your back to your side while in a flat bed without using bedrails?: A Little Help needed moving from lying on your back to sitting on the side of a flat bed without using bedrails?: A Lot (no rails) Help needed moving to and from a bed to a chair (including a wheelchair)?: A Little Help  needed standing up from a chair using your arms (e.g., wheelchair or bedside chair)?: A Little Help needed to walk in hospital room?: A Little Help needed climbing 3-5 steps with a railing? : A Lot 6 Click Score: 16    End of Session Equipment Utilized During Treatment: Gait belt Activity Tolerance: Patient tolerated treatment well;Treatment limited secondary to medical complications (Comment);Other (comment) (tachy with exertion) Patient left: with call bell/phone within reach;in bed;with bed alarm set;with family/visitor present;Other (comment) (Echo tech in room setting up for study; daughter present) Nurse Communication: Mobility status;Other (comment) (elevated HR) PT Visit Diagnosis: Unsteadiness on feet (R26.81);Difficulty in walking, not elsewhere classified (R26.2)     Time: 6578-4696 PT Time Calculation (min) (ACUTE ONLY): 16 min  Charges:    $Gait Training: 8-22 mins PT General Charges $$ ACUTE PT VISIT: 1 Visit                     Niana Martorana P., PTA Acute Rehabilitation Services Secure Chat Preferred 9a-5:30pm Office: [phone]    Dorathy Kinsman [facility] 08/09/2023, 3:57 PM

## 2023-08-09 NOTE — Progress Notes (Signed)
OT Cancellation Note  Patient Details Name: Jocelyn [name] MRN: [id number] DOB: 12-23-1955   Cancelled Treatment:    Reason Eval/Treat Not Completed: Patient at procedure or test/ unavailable (Pt just finished session with PT, now echo tech in room. OT to follow up when able.)  Donia Pounds 08/09/2023, 2:26 PM

## 2023-08-09 NOTE — Progress Notes (Signed)
Mobility Specialist Progress Note;   08/09/23 1150  Mobility  Activity Ambulated with assistance to bathroom  Level of Assistance Minimal assist, patient does 75% or more  Assistive Device Other (Comment) (HHA)  Distance Ambulated (ft) 30 ft  Activity Response Tolerated fair  Mobility Referral Yes  $Mobility charge 1 Mobility  Mobility Specialist Start Time (ACUTE ONLY) 1150  Mobility Specialist Stop Time (ACUTE ONLY) 1230  Mobility Specialist Time Calculation (min) (ACUTE ONLY) 40 min    During-mobility: HR 145-166 bpm Post-mobility: HR 130s bpm  Pt agreeable to mobility.  Upon standing, pt became incontinent of urine and bowels. Perciare performed with assist from RN. HR elevated throughout, up to 166 at peak. Further mobility deferred d/t elevated HR and incr fatigue. Pt back in bed with all needs met, alarm on. VSS on 1LO2.   Caesar Bookman Mobility Specialist Please contact via SecureChat or Rehab Office [phone]

## 2023-08-09 NOTE — Progress Notes (Addendum)
ANTICOAGULATION CONSULT NOTE - Follow Up Consult  Pharmacy Consult for Heparin IV Indication: pulmonary embolus  Allergies  Allergen Reactions   Actos [Pioglitazone] Other (See Comments)    REACTION: pt states INTOL w/ edema   Codeine Other (See Comments)    REACTION: vomiting   Januvia [Sitagliptin] Nausea Only   Lactose Intolerance (Gi) Diarrhea   Morphine Nausea Only and Nausea And Vomiting    REACTION: vomiting REACTION: vomiting   Repatha [Evolocumab]     WAS NOT EFFECTIVE IN LOWERING LDL    Patient Measurements: Height: 5\' 2"  (157.5 cm) Weight: 78.7 kg (173 lb 8 oz) IBW/kg (Calculated) : 50.1 Heparin Dosing Weight: 64.5 kg  Vital Signs: Temp: 98.5 F (36.9 C) (09/24 0800) Temp Source: Oral (09/24 0800) BP: 151/79 (09/24 0800) Pulse Rate: 104 (09/24 0800)  Labs: Recent Labs    08/07/23 0428 08/07/23 0552 08/07/23 1549 08/08/23 0345 08/08/23 1040 08/09/23 0838  HGB 7.5*  --   --  7.4*  --  8.1*  HCT 21.9*  --   --  22.8*  --  25.8*  PLT 114*  --   --  135*  --  150  HEPARINUNFRC  --    < > 0.46 0.52  --  0.44  CREATININE 1.22*  --   --  0.99  --   --   TROPONINIHS  --   --   --   --  40*  --    < > = values in this interval not displayed.    Estimated Creatinine Clearance: 52.8 mL/min (by C-G formula based on SCr of 0.99 mg/dL).   Medications:  Scheduled:   aspirin EC  81 mg Oral Daily   azaTHIOprine  100 mg Oral Daily   Chlorhexidine Gluconate Cloth  6 each Topical Daily   docusate sodium  100 mg Oral BID   famotidine  20 mg Oral Daily   feeding supplement  1 Container Oral TID BM   insulin aspart  0-20 Units Subcutaneous Q4H   methylPREDNISolone  4 mg Oral Daily   pantoprazole  40 mg Oral Daily   polyethylene glycol  17 g Oral Daily   Infusions:   sodium chloride     sodium chloride Stopped (08/06/23 0422)   heparin 800 Units/hr (08/09/23 0326)    Assessment: 67 years of age female with saddle PE initially on IV Heparin then coded and  received TNK at 2300 PM on 9/18.  Heparin level originally therapeutic (0.51) while on heparin 600 units/hr. Hgb dropped and heparin held for a few hours. Restarted at previous rate on 9/21.  Heparin level 0.52 units/mL (therapeutic) on heparin 800 units/hr. CBC low but stable, small amount of bleeding from the nose overnight per RN but not saturated and not actively bleeding.   9/24 AM update: HL 0.44 No signs of bleeding or issues with hep gtt   Goal of Therapy:  Heparin level 0.3-0.7 units/ml  Monitor platelets by anticoagulation protocol: Yes   Plan:  Continue heparin 800 units/hr Daily heparin level and CBC while on heparin Continue to monitor for s/x of bleeding  F/u plans for transitioning off heparin  Thank you for involving pharmacy in the patient's care.   Greta Doom BS, PharmD, BCPS Clinical Pharmacist 08/09/2023 10:28 AM  Contact: [phone] after 3 PM  "Be curious, not judgmental..." -Debbora Dus

## 2023-08-09 NOTE — Progress Notes (Signed)
   Echo reviewed. LVEF 60-65% RV still mildly dilated with mild HK.   Will need outpatient f/u in HF Clinic.   AHF team will sign off please call with questions.   Arvilla Meres, MD  3:29 PM

## 2023-08-09 NOTE — Care Management Important Message (Signed)
Important Message  Patient Details  Name: Jocelyn [name] MRN: [id number] Date of Birth: [birth year]   Important Message Given:  Yes - Medicare IM     Dorena Bodo 08/09/2023, 3:30 PM

## 2023-08-09 NOTE — Plan of Care (Signed)

## 2023-08-09 NOTE — Progress Notes (Addendum)
PROGRESS NOTE    Jocelyn [name]  UEA:[id number] DOB: [birth year] DOA: 08/03/2023 PCP: Renaye Rakers, MD   Brief Narrative:   67 year old female with history of CAD with inferior wall STEMI in 06/2017 status post DES to RCA, hyperlipidemia, polymyositis on azathioprine and steroids, sarcoidosis, hypertension, diabetes mellitus type 2, obesity and OSA was admitted to ICU under PCCM service for worsening shortness of breath/presyncope and was found to have hypothermia/acute respiratory failure with hypoxia, lactic acidosis with CT angiogram showing saddle pulmonary embolism as well as extensive bilateral emboli.  Subsequently she had PEA arrest requiring CPR and epinephrine and intubation.  She was given TNK.  Underwent IR thrombectomy on 08/03/2023.  Needed IV heparin and vasopressors.  On 08/04/2023, there was a concern for seizure-like activity but EEG was negative.  Neurology was consulted.  CT head was negative for any acute intracranial process.  Extubated on 08/05/2023.  MRI of brain showed small meningioma, most likely incidental: Neurology signed off and did not recommend antiseizure medications and recommended no driving for 6 months.  Initial point-of-care ultrasound had shown EF of 30 to 35%: Heart failure team was consulted: 2D echo subsequently showed EF of 60 to 65%.  Pressors were gradually weaned off.  Received 1 unit of packed red cell transfusion on 08/06/2023.  Care was transferred to [facility] service from 08/08/2023 onwards.  Assessment & Plan:   Massive pulmonary embolism with cor pulmonale Cardiogenic shock/RV failure with shock Status post PEA arrest -had PEA arrest requiring CPR and epinephrine and intubation.  She was given TNK.  Underwent IR thrombectomy on 08/03/2023.  Needed IV heparin and vasopressors.  -Vasopressors and milrinone have already been weaned off. -Care was transferred to [facility] service from 08/08/2023 onwards.  PCCM on 08/07/2023 and recommended heparin drip for another 24 to  48 hours followed by DOAC full dose for 1 to 2 years.  Will need outpatient pulmonary follow-up. -She is on heparin GTT, transition to Eliquis, hopefully by today, but will await for repeat echo obtained by cardiology secondary to persistent tachycardia. -Blood pressure has stabilized. Initial point-of-care ultrasound had shown EF of 30 to 35%: Heart failure team was consulted: 2D echo subsequently showed EF of 60 to 65%.  Repeat echo is pending for today.  Acute respiratory failure with hypoxia -Extubated on 08/05/2023. -Currently on 2 L oxygen via nasal cannula.  Wean off as able.  Might need supplemental oxygen on discharge. -She was encouraged use incentive spirometer  Shock liver/hepatic congestion/elevated LFTs -LFTs are improving plan monitor  Seizure-like activity -There was a concern for seizure-like activity on 924.  EEG negative.  MRI of brain showed small meningioma. -Neurology signed off and did not recommend antiseizure medications and recommended no driving for 6 months.    History of CAD s/p DES to RCA in 2018 - H/o inferior MI in 8/18 with DES to RCA. Cardiolite in 9/24 showed no ischemia/infarction.  -Cardiology following.  He is on Plavix at home, will stop this time as she will be discharged on DOAC per cards.  DOAC  AKI -resolved.  Anemia of chronic disease/macrocytosis -Required 1 unit of packed red cell transfusion on 08/06/2023 for hemoglobin of 4.9.  His hemoglobin is 8.1, will hold on transfusion.   Thrombocytopenia -Questionable cause; improving.  Monitor  Leukocytosis -Resolved  Hyponatremia -Resolved  Hypokalemia -Resolved  Diabetes mellitus type 2 with hyperglycemia -Possibly from steroid use CBG much improved and currently on the lower side.  DC long-acting insulin.  Continue CBGs with SSI  Hypomagnesemia -improved  Physical deconditioning -PT/OT recommending home health PT/OT  Obesity Body mass index is 31.73 kg/m. -Outpatient  follow-up  Patient has Foley catheter, which will be discontinued today Patient had left IJ TLC, this is to be discontinued in 24 hours if she remains stable    DVT prophylaxis: Heparin drip Code Status: Full Family Communication: Discussed with daughter at bedside Disposition Plan: Status is: Inpatient Remains inpatient appropriate because: Of severity of illness    Consultants: PCCM/IR/neurology/heart failure  Procedures: As above  Antimicrobials: None   Subjective: No significant events overnight, she ports she is feeling better today, no chest pain, no nausea, no vomiting Objective: Vitals:   08/08/23 1940 08/09/23 0000 08/09/23 0312 08/09/23 0800  BP: 123/81 (!) 152/91 (!) 155/79 (!) 151/79  Pulse: 95 100 94 (!) 104  Resp: 16 20 16 17   Temp: 98 F (36.7 C) 98.1 F (36.7 C) 98 F (36.7 C) 98.5 F (36.9 C)  TempSrc: Oral Oral Oral Oral  SpO2: 90% 98% 97% 96%  Weight:      Height:        Intake/Output Summary (Last 24 hours) at 08/09/2023 0942 Last data filed at 08/09/2023 0326 Gross per 24 hour  Intake 123.69 ml  Output 1050 ml  Net -926.31 ml   Filed Weights   08/05/23 0425 08/06/23 0500 08/07/23 0439  Weight: 77.2 kg 80.7 kg 78.7 kg    Examination:   Awake Alert, Oriented X 3, No new F.N deficits, Normal affect, frail, deconditioned Symmetrical Chest wall movement, Good air movement bilaterally, CTAB RRR,No Gallops,Rubs or new Murmurs, No Parasternal Heave +ve B.Sounds, Abd Soft, No tenderness, No rebound - guarding or rigidity. No Cyanosis, Clubbing, right upper extremity edema bruising at the VSA line site, right groin area with no significant bleed or ecchymosis, right breast with significant bruising secondary to previous CPR, Foley catheter present, left IJ TLC present.    Data Reviewed: I have personally reviewed following labs and imaging studies  CBC: Recent Labs  Lab 08/03/23 1954 08/03/23 2359 08/05/23 0601 08/05/23 0756  08/06/23 0630 08/06/23 0742 08/06/23 1252 08/07/23 0428 08/08/23 0345  WBC 6.7   < > 9.2  --  6.2  --  7.2 6.4 7.3  NEUTROABS 4.8  --   --   --   --   --   --   --   --   HGB 11.7*   < > 7.2*   < > 4.9* 6.8* 8.2* 7.5* 7.4*  HCT 35.9*   < > 21.1*   < > 14.5* 19.5* 23.2* 21.9* 22.8*  MCV 108.5*   < > 101.9*  --  105.8*  --  97.1 99.1 105.1*  PLT 220   < > 166  --  100*  --  128* 114* 135*   < > = values in this interval not displayed.   Basic Metabolic Panel: Recent Labs  Lab 08/04/23 0249 08/04/23 0522 08/05/23 0424 08/05/23 0756 08/05/23 1616 08/05/23 2028 08/06/23 0630 08/07/23 0428 08/08/23 0345  NA 141   < > 135 132*  --   --  134* 142 143  K 5.2*   < > 3.4* 3.6  --   --  3.5 4.0 3.6  CL 108  --  97*  --   --   --  99 103 103  CO2 9*  --  24  --   --   --  24 29 33*  GLUCOSE 212*  --  205*  --   --   --  169* 127* 86  BUN 31*  --  34*  --   --   --  33* 25* 17  CREATININE 1.82*  --  1.69*  --   --   --  1.58* 1.22* 0.99  CALCIUM 8.2*  --  7.7*  --   --   --  7.6* 8.0* 8.0*  MG 1.7   < > 1.1*  --  2.9* 2.9* 2.7* 2.9* 2.7*  PHOS 7.5*   < > 3.9  --  3.7  --  3.2 3.3 3.3   < > = values in this interval not displayed.   GFR: Estimated Creatinine Clearance: 52.8 mL/min (by C-G formula based on SCr of 0.99 mg/dL). Liver Function Tests: Recent Labs  Lab 08/05/23 0424 08/06/23 1359 08/07/23 0428 08/08/23 0345  AST 503* 113* 72* 43*  ALT 208* 116* 91* 65*  ALKPHOS 51 51 45 43  BILITOT 0.9 1.1 1.1 0.7  PROT 4.5* 5.1* 4.7* 4.6*  ALBUMIN 2.4* 2.6* 2.4* 2.3*   No results for input(s): "LIPASE", "AMYLASE" in the last 168 hours. No results for input(s): "AMMONIA" in the last 168 hours. Coagulation Profile: Recent Labs  Lab 08/03/23 2359  INR 2.1*   Cardiac Enzymes: No results for input(s): "CKTOTAL", "CKMB", "CKMBINDEX", "TROPONINI" in the last 168 hours. BNP (last 3 results) No results for input(s): "PROBNP" in the last 8760 hours. HbA1C: No results for  input(s): "HGBA1C" in the last 72 hours. CBG: Recent Labs  Lab 08/08/23 2013 08/08/23 2334 08/09/23 0323 08/09/23 0526 08/09/23 0751  GLUCAP 94 121* 72 89 94   Lipid Profile: No results for input(s): "CHOL", "HDL", "LDLCALC", "TRIG", "CHOLHDL", "LDLDIRECT" in the last 72 hours. Thyroid Function Tests: No results for input(s): "TSH", "T4TOTAL", "FREET4", "T3FREE", "THYROIDAB" in the last 72 hours. Anemia Panel: No results for input(s): "VITAMINB12", "FOLATE", "FERRITIN", "TIBC", "IRON", "RETICCTPCT" in the last 72 hours. Sepsis Labs: Recent Labs  Lab 08/03/23 2359 08/04/23 1126 08/05/23 0812 08/08/23 0610  LATICACIDVEN >9.0* >9.0* 1.1 0.7    Recent Results (from the past 240 hour(s))  Resp panel by RT-PCR (RSV, Flu A&B, Covid) Anterior Nasal Swab     Status: None   Collection Time: 08/03/23  7:54 PM   Specimen: Anterior Nasal Swab  Result Value Ref Range Status   SARS Coronavirus 2 by RT PCR NEGATIVE NEGATIVE Final    Comment: (NOTE) SARS-CoV-2 target nucleic acids are NOT DETECTED.  The SARS-CoV-2 RNA is generally detectable in upper respiratory specimens during the acute phase of infection. The lowest concentration of SARS-CoV-2 viral copies this assay can detect is 138 copies/mL. A negative result does not preclude SARS-Cov-2 infection and should not be used as the sole basis for treatment or other patient management decisions. A negative result may occur with  improper specimen collection/handling, submission of specimen other than nasopharyngeal swab, presence of viral mutation(s) within the areas targeted by this assay, and inadequate number of viral copies(<138 copies/mL). A negative result must be combined with clinical observations, patient history, and epidemiological information. The expected result is Negative.  Fact Sheet for Patients:  BloggerCourse.com  Fact Sheet for Healthcare Providers:   SeriousBroker.it  This test is no t yet approved or cleared by the [place] FDA and  has been authorized for detection and/or diagnosis of SARS-CoV-2 by FDA under an Emergency Use Authorization (EUA). This EUA will remain  in effect (meaning this test can be used) for the duration of the COVID-19 declaration under Section 564(b)(1) of the  Act, 21 U.S.C.section 360bbb-3(b)(1), unless the authorization is terminated  or revoked sooner.       Influenza A by PCR NEGATIVE NEGATIVE Final   Influenza B by PCR NEGATIVE NEGATIVE Final    Comment: (NOTE) The Xpert Xpress SARS-CoV-2/FLU/RSV plus assay is intended as an aid in the diagnosis of influenza from Nasopharyngeal swab specimens and should not be used as a sole basis for treatment. Nasal washings and aspirates are unacceptable for Xpert Xpress SARS-CoV-2/FLU/RSV testing.  Fact Sheet for Patients: BloggerCourse.com  Fact Sheet for Healthcare Providers: SeriousBroker.it  This test is not yet approved or cleared by the [place] FDA and has been authorized for detection and/or diagnosis of SARS-CoV-2 by FDA under an Emergency Use Authorization (EUA). This EUA will remain in effect (meaning this test can be used) for the duration of the COVID-19 declaration under Section 564(b)(1) of the Act, 21 U.S.C. section 360bbb-3(b)(1), unless the authorization is terminated or revoked.     Resp Syncytial Virus by PCR NEGATIVE NEGATIVE Final    Comment: (NOTE) Fact Sheet for Patients: BloggerCourse.com  Fact Sheet for Healthcare Providers: SeriousBroker.it  This test is not yet approved or cleared by the [place] FDA and has been authorized for detection and/or diagnosis of SARS-CoV-2 by FDA under an Emergency Use Authorization (EUA). This EUA will remain in effect (meaning this test can be used) for  the duration of the COVID-19 declaration under Section 564(b)(1) of the Act, 21 U.S.C. section 360bbb-3(b)(1), unless the authorization is terminated or revoked.  Performed at [facility], [address], [place], [place] [address]   Blood culture (routine x 2)     Status: None   Collection Time: 08/03/23  8:03 PM   Specimen: BLOOD  Result Value Ref Range Status   Specimen Description   Final    BLOOD RIGHT ANTECUBITAL Performed at [facility], [address], [place], [place] [address]    Special Requests   Final    BOTTLES DRAWN AEROBIC AND ANAEROBIC Blood Culture adequate volume Performed at [facility], [address], [place], [place] [address]    Culture   Final    NO GROWTH 5 DAYS Performed at [facility], [address], [place], [place] [address]    Report Status 08/09/2023 FINAL  Final  Blood culture (routine x 2)     Status: None   Collection Time: 08/03/23  8:04 PM   Specimen: BLOOD  Result Value Ref Range Status   Specimen Description   Final    BLOOD LEFT ANTECUBITAL Performed at [facility], [address] [place], [place] [address]    Special Requests   Final    BOTTLES DRAWN AEROBIC AND ANAEROBIC Blood Culture adequate volume Performed at [facility], [address], [place], [place] [address]    Culture   Final    NO GROWTH 5 DAYS Performed at [facility], [address], [place], [place] [address]    Report Status 08/09/2023 FINAL  Final  MRSA Next Gen by PCR, Nasal     Status: None   Collection Time: 08/03/23 10:54 PM   Specimen: Nasal Mucosa; Nasal Swab  Result Value Ref Range Status   MRSA by PCR Next Gen NOT DETECTED NOT DETECTED Final    Comment: (NOTE) The GeneXpert MRSA Assay (FDA approved for NASAL specimens only), is one component of a comprehensive MRSA colonization surveillance program. It is not intended to diagnose  MRSA infection  nor to guide or monitor treatment for MRSA infections. Test performance is not FDA approved in patients less than 56 years old. Performed at [facility], [address] [place], [place] [address]          Radiology Studies: No results found.      Scheduled Meds:  aspirin EC  81 mg Oral Daily   azaTHIOprine  100 mg Oral Daily   Chlorhexidine Gluconate Cloth  6 each Topical Daily   docusate sodium  100 mg Oral BID   famotidine  20 mg Oral Daily   feeding supplement  1 Container Oral TID BM   insulin aspart  0-20 Units Subcutaneous Q4H   methylPREDNISolone  4 mg Oral Daily   polyethylene glycol  17 g Oral Daily   Continuous Infusions:  sodium chloride     sodium chloride Stopped (08/06/23 0422)   heparin 800 Units/hr (08/09/23 0326)          Huey Bienenstock, MD [facility] 08/09/2023, 9:42 AM

## 2023-08-10 DIAGNOSIS — I2699 Other pulmonary embolism without acute cor pulmonale: Secondary | ICD-10-CM | POA: Diagnosis not present

## 2023-08-10 DIAGNOSIS — I10 Essential (primary) hypertension: Secondary | ICD-10-CM

## 2023-08-10 DIAGNOSIS — E785 Hyperlipidemia, unspecified: Secondary | ICD-10-CM

## 2023-08-10 DIAGNOSIS — J9601 Acute respiratory failure with hypoxia: Secondary | ICD-10-CM | POA: Diagnosis not present

## 2023-08-10 LAB — COMPREHENSIVE METABOLIC PANEL
ALT: 36 U/L (ref 0–44)
AST: 23 U/L (ref 15–41)
Albumin: 2.4 g/dL — ABNORMAL LOW (ref 3.5–5.0)
Alkaline Phosphatase: 41 U/L (ref 38–126)
Anion gap: 6 (ref 5–15)
BUN: 11 mg/dL (ref 8–23)
CO2: 32 mmol/L (ref 22–32)
Calcium: 8.2 mg/dL — ABNORMAL LOW (ref 8.9–10.3)
Chloride: 104 mmol/L (ref 98–111)
Creatinine, Ser: 1.13 mg/dL — ABNORMAL HIGH (ref 0.44–1.00)
GFR, Estimated: 53 mL/min — ABNORMAL LOW (ref >60)
Glucose, Bld: 79 mg/dL (ref 70–99)
Potassium: 3.7 mmol/L (ref 3.5–5.1)
Sodium: 142 mmol/L (ref 135–145)
Total Bilirubin: 1 mg/dL (ref 0.3–1.2)
Total Protein: 5 g/dL — ABNORMAL LOW (ref 6.5–8.1)

## 2023-08-10 LAB — GLUCOSE, CAPILLARY
Glucose-Capillary: 85 mg/dL (ref 70–99)
Glucose-Capillary: 105 mg/dL — ABNORMAL HIGH (ref 70–99)
Glucose-Capillary: 158 mg/dL — ABNORMAL HIGH (ref 70–99)
Glucose-Capillary: 110 mg/dL — ABNORMAL HIGH (ref 70–99)
Glucose-Capillary: 102 mg/dL — ABNORMAL HIGH (ref 70–99)

## 2023-08-10 LAB — PHOSPHORUS: Phosphorus: 3.3 mg/dL (ref 2.5–4.6)

## 2023-08-10 LAB — MAGNESIUM: Magnesium: 1.9 mg/dL (ref 1.7–2.4)

## 2023-08-10 LAB — HEPARIN LEVEL (UNFRACTIONATED): Heparin Unfractionated: 0.37 IU/mL (ref 0.30–0.70)

## 2023-08-10 MED ORDER — APIXABAN 5 MG PO TABS: 5.0000 mg | ORAL_TABLET | Freq: Two times a day (BID) | ORAL | Status: DC

## 2023-08-10 MED ORDER — METOPROLOL TARTRATE 12.5 MG HALF TABLET
12.5000 mg | ORAL_TABLET | Freq: Two times a day (BID) | ORAL | Status: DC
  Administered 2023-08-10 (×2): 12.5 mg via ORAL
  Filled 2023-08-10 (×2): qty 1

## 2023-08-10 MED ORDER — APIXABAN 5 MG PO TABS: 10.0000 mg | ORAL_TABLET | Freq: Two times a day (BID) | ORAL | Status: DC

## 2023-08-10 MED ORDER — APIXABAN 5 MG PO TABS
10.0000 mg | ORAL_TABLET | Freq: Two times a day (BID) | ORAL | Status: DC
  Administered 2023-08-10 – 2023-08-11 (×3): 10 mg via ORAL
  Filled 2023-08-10 (×3): qty 2

## 2023-08-10 MED ORDER — INSULIN ASPART 100 UNIT/ML IJ SOLN
0.0000 [IU] | Freq: Three times a day (TID) | INTRAMUSCULAR | Status: DC
  Administered 2023-08-10: 2 [IU] via SUBCUTANEOUS

## 2023-08-10 NOTE — Progress Notes (Signed)
Mobility Specialist Progress Note;   08/10/23 1440  Mobility  Activity Ambulated with assistance in hallway  Level of Assistance Minimal assist, patient does 75% or more  Assistive Device Four wheel walker  Distance Ambulated (ft) 150 ft  Activity Response Tolerated well  Mobility Referral Yes  $Mobility charge 1 Mobility  Mobility Specialist Start Time (ACUTE ONLY) 1440  Mobility Specialist Stop Time (ACUTE ONLY) 1500  Mobility Specialist Time Calculation (min) (ACUTE ONLY) 20 min    During-mobility: HR 115-120 bpm Post-mobility: HR 84 bpm  Pt agreeable to mobility. Required MinA to stand, MinG to ambulate. VSS on RA throughout ambulation. Pt had no c/o and left in bed with all needs met, alarm on.  Caesar Bookman Mobility Specialist Please contact via SecureChat or Rehab Office [phone]

## 2023-08-10 NOTE — TOC Progression Note (Signed)
Transition of Care [facility]) - Progression Note    Patient Details  Name: LAURYL RIEMERSMA MRN: [id number] Date of Birth: 1956-05-03  Transition of Care [facility] CM/SW Contact  Nicanor Bake Phone Number: [phone] 08/10/2023, 12:25 PM  Clinical Narrative:  [facility] CSW called and scheduled pts follow up hospital appointment scheudled for Monday, August 22, 2023 at 10: 30 AM.   TOC will continue following.     Expected Discharge Plan: Home/Self Care Barriers to Discharge: Continued Medical Work up  Expected Discharge Plan and Services       Living arrangements for the past 2 months: Single Family Home                                       Social Determinants of Health (SDOH) Interventions SDOH Screenings   Food Insecurity: No Food Insecurity (08/05/2023)  Transportation Needs: No Transportation Needs (08/05/2023)  Utilities: Not At Risk (08/05/2023)  Alcohol Screen: Low Risk  (08/05/2023)  Depression (PHQ2-9): Low Risk  (09/03/2020)  Financial Resource Strain: Low Risk  (08/05/2023)  Stress: No Stress Concern Present (08/05/2023)  Tobacco Use: Low Risk  (08/03/2023)    Readmission Risk Interventions     No data to display

## 2023-08-10 NOTE — Plan of Care (Signed)

## 2023-08-10 NOTE — Progress Notes (Signed)
OT Cancellation Note  Patient Details Name: Jocelyn [name] MRN: [id number] DOB: [birth year]   Cancelled Treatment:    Reason Eval/Treat Not Completed: Patient at procedure or test/ unavailable (Pt with mobility specialist upon arrival, OT to allow pt to rest this afternoon and return tomorrow for treatment.)  Donia Pounds 08/10/2023, 3:33 PM

## 2023-08-10 NOTE — Progress Notes (Signed)
PROGRESS NOTE        PATIENT DETAILS Name: Jocelyn [name] Age: 67 y.o. Sex: female Date of Birth: 1956-11-07 Admit Date: 08/03/2023 Admitting Physician Glade Lloyd, MD GLO:VFIEP, Adrian Saran, MD  Brief Summary: 67 year old with history of CAD s/p PCI to 31, polymyositis on azathioprine/steroids, sarcoidosis, HTN, DM-2-who presented with presyncope/shortness of breath-she was subsequently found to have massive pulmonary embolism-Hospital course complicated by PEA arrest requiring CPR/intubation-she underwent thrombolytic therapy-subsequently IR performed thrombectomy.  She was stabilized-in the ICU-and transferred to [facility] on 9/23.   Significant studies 9/18>> CT angio chest: Extensive bilateral PE-right heart strain. 9/19>> CT head: No acute abnormalities. 9/19>> echo: EF 60-65%, RV dilated-RV systolic function mildly reduced. 9/19>> EEG: No seizures. 9/20>> bilateral lower extremity Doppler: Acute DVT involving right femoral vein, right proximal profunda vein, right popliteal vein. 9/21>> MRI brain: No acute findings-6 mm meningioma along the left frontal convexity 9/21>> MRV head: No thrombosis 9/24>> Limited echo: EF 60-65%, RV systolic function is mildly reduced.   Procedures 9/19>> thrombectomy by IR  Consults: PCCM Cardiology Neurology  Subjective: Lying comfortably in bed-denies any chest pain or shortness of breath.  Objective: Vitals: Blood pressure (!) 158/85, pulse (!) 110, temperature 98.1 F (36.7 C), temperature source Oral, resp. rate (!) 23, height 5\' 2"  (1.575 m), weight 78.7 kg, SpO2 95%.   Exam: Gen Exam:Alert awake-not in any distress HEENT:atraumatic, normocephalic Chest: B/L clear to auscultation anteriorly CVS:S1S2 regular Abdomen:soft non tender, non distended Extremities:no edema Neurology: Non focal Skin: no rash  Pertinent Labs/Radiology:    Latest Ref Rng & Units 08/09/2023    8:38 AM 08/08/2023    3:45 AM 08/07/2023     4:28 AM  CBC  WBC 4.0 - 10.5 K/uL 8.3  7.3  6.4   Hemoglobin 12.0 - 15.0 g/dL 8.1  7.4  7.5   Hematocrit 36.0 - 46.0 % 25.8  22.8  21.9   Platelets 150 - 400 K/uL 150  135  114     Lab Results  Component Value Date   NA 142 08/10/2023   K 3.7 08/10/2023   CL 104 08/10/2023   CO2 32 08/10/2023      Assessment/Plan: Massive pulmonary embolism with cardiogenic shock due to RV failure S/p PEA arrest S/p TNK-and subsequent IR guided thrombectomy Stabilized in the ICU-extubated-now on room air Maintain on IV heparin-has been transitioned to Eliquis Eliquis today-mobilize with physical therapy/nursing staff and see how she does.  If doing well-can likely be discharged home soon. Initial point-of-care ultrasound showed EF 30-35%-subsequent echo which showed stability in EF Followed closely by both PCCM/cardiology-no further recommendations-please  ensure patient follows up with PCCM/cardiology in the outpatient setting Likely will require indefinite anticoagulation.   Acute hypoxic respiratory failure In the setting of PE/PEA arrest-extubated on 9/20 Has been weaned to room air.   AKI Likely hemodynamically mediated Resolved.   Shock liver Resolved   Seizure-like activity This occurred while she was in the ICU-MRI brain/EEG negative Evaluated by neurology-no recommendations to start AED.   Meningioma Incidental finding per neurology Will need monitoring in the outpatient setting.   CAD s/p PCI 2018 Recent nuclear stress test negative Per cardiology-no need for Plavix as she will be on Eliquis   HLD Praluent at home   HTN BP still creeping up Continue to hold losartan for now Starting low-dose beta-blocker Further optimization deferred  to outpatient setting.  DM-2 (A1c 6.2 on 9/18) Monitor BG's on SSI Resume oral hypoglycemic agents on discharge   History of sarcoidosis History of polymyositis Steroids/azathioprine will be continued on discharge    Normocytic anemia Due to critical illness supreme posed on anemia of chronic disease Did require 1 unit of PRBC during this hospitalization-Hb stable since then.   Continue to monitor CBC   Nutrition Status: Nutrition Problem: Inadequate oral intake Etiology: inability to eat Signs/Symptoms: NPO status Interventions: Refer to RD note for recommendations   Obesity: Estimated body mass index is 31.73 kg/m as calculated from the following:   Height as of this encounter: 5\' 2"  (1.575 m).   Weight as of this encounter: 78.7 kg.    Code status:   Code Status: Full Code   DVT Prophylaxis: SCDs Start: 08/03/23 2339 apixaban (ELIQUIS) tablet 10 mg  apixaban (ELIQUIS) tablet 5 mg    Family Communication: Daughter at bedside   Disposition Plan: Status is: Inpatient Remains inpatient appropriate because: Severity of illness   Planned Discharge Destination:Home health   Diet: Diet Order             Diet Heart Fluid consistency: Thin  Diet effective now                     Antimicrobial agents: Anti-infectives (From admission, onward)    None        MEDICATIONS: Scheduled Meds:  apixaban  10 mg Oral BID   Followed by   Melene Muller ON 08/17/2023] apixaban  5 mg Oral BID   aspirin EC  81 mg Oral Daily   azaTHIOprine  100 mg Oral Daily   docusate sodium  100 mg Oral BID   famotidine  20 mg Oral Daily   feeding supplement  1 Container Oral TID BM   methylPREDNISolone  4 mg Oral Daily   metoprolol tartrate  12.5 mg Oral BID   mirabegron ER  25 mg Oral Daily   pantoprazole  40 mg Oral Daily   polyethylene glycol  17 g Oral Daily   Continuous Infusions:  sodium chloride     sodium chloride Stopped (08/06/23 0422)   PRN Meds:.sodium chloride, metoCLOPramide (REGLAN) injection, ondansetron (ZOFRAN) IV, mouth rinse, phenol   I have personally reviewed following labs and imaging studies  LABORATORY DATA: CBC: Recent Labs  Lab 08/03/23 1954 08/03/23 2359  08/06/23 0630 08/06/23 0742 08/06/23 1252 08/07/23 0428 08/08/23 0345 08/09/23 0838  WBC 6.7   < > 6.2  --  7.2 6.4 7.3 8.3  NEUTROABS 4.8  --   --   --   --   --   --   --   HGB 11.7*   < > 4.9* 6.8* 8.2* 7.5* 7.4* 8.1*  HCT 35.9*   < > 14.5* 19.5* 23.2* 21.9* 22.8* 25.8*  MCV 108.5*   < > 105.8*  --  97.1 99.1 105.1* 108.4*  PLT 220   < > 100*  --  128* 114* 135* 150   < > = values in this interval not displayed.    Basic Metabolic Panel: Recent Labs  Lab 08/06/23 0630 08/07/23 0428 08/08/23 0345 08/09/23 0838 08/10/23 0346  NA 134* 142 143 140 142  K 3.5 4.0 3.6 3.7 3.7  CL 99 103 103 102 104  CO2 24 29 33* 30 32  GLUCOSE 169* 127* 86 116* 79  BUN 33* 25* 17 12 11   CREATININE 1.58* 1.22* 0.99 0.98 1.13*  CALCIUM 7.6* 8.0* 8.0* 8.2* 8.2*  MG 2.7* 2.9* 2.7* 2.1 1.9  PHOS 3.2 3.3 3.3 3.2 3.3    GFR: Estimated Creatinine Clearance: 46.3 mL/min (A) (by C-G formula based on SCr of 1.13 mg/dL (H)).  Liver Function Tests: Recent Labs  Lab 08/06/23 1359 08/07/23 0428 08/08/23 0345 08/09/23 0838 08/10/23 0346  AST 113* 72* 43* 30 23  ALT 116* 91* 65* 49* 36  ALKPHOS 51 45 43 47 41  BILITOT 1.1 1.1 0.7 0.8 1.0  PROT 5.1* 4.7* 4.6* 5.1* 5.0*  ALBUMIN 2.6* 2.4* 2.3* 2.5* 2.4*   No results for input(s): "LIPASE", "AMYLASE" in the last 168 hours. No results for input(s): "AMMONIA" in the last 168 hours.  Coagulation Profile: Recent Labs  Lab 08/03/23 2359  INR 2.1*    Cardiac Enzymes: No results for input(s): "CKTOTAL", "CKMB", "CKMBINDEX", "TROPONINI" in the last 168 hours.  BNP (last 3 results) No results for input(s): "PROBNP" in the last 8760 hours.  Lipid Profile: No results for input(s): "CHOL", "HDL", "LDLCALC", "TRIG", "CHOLHDL", "LDLDIRECT" in the last 72 hours.  Thyroid Function Tests: No results for input(s): "TSH", "T4TOTAL", "FREET4", "T3FREE", "THYROIDAB" in the last 72 hours.  Anemia Panel: No results for input(s): "VITAMINB12",  "FOLATE", "FERRITIN", "TIBC", "IRON", "RETICCTPCT" in the last 72 hours.  Urine analysis:    Component Value Date/Time   COLORURINE LT YELLOW 07/31/2007 1231   APPEARANCEUR Clear 07/31/2007 1231   LABSPEC 1.010 10/06/2013 1905   PHURINE 6.0 10/06/2013 1905   GLUCOSEU NEGATIVE 10/06/2013 1905   GLUCOSEU NEGATIVE 07/31/2007 1231   HGBUR NEGATIVE 10/06/2013 1905   BILIRUBINUR NEGATIVE 10/06/2013 1905   KETONESUR NEGATIVE 10/06/2013 1905   PROTEINUR NEGATIVE 10/06/2013 1905   UROBILINOGEN 0.2 10/06/2013 1905   NITRITE NEGATIVE 10/06/2013 1905   LEUKOCYTESUR NEGATIVE 10/06/2013 1905    Sepsis Labs: Lactic Acid, Venous    Component Value Date/Time   LATICACIDVEN 0.7 08/08/2023 1610    MICROBIOLOGY: Recent Results (from the past 240 hour(s))  Resp panel by RT-PCR (RSV, Flu A&B, Covid) Anterior Nasal Swab     Status: None   Collection Time: 08/03/23  7:54 PM   Specimen: Anterior Nasal Swab  Result Value Ref Range Status   SARS Coronavirus 2 by RT PCR NEGATIVE NEGATIVE Final    Comment: (NOTE) SARS-CoV-2 target nucleic acids are NOT DETECTED.  The SARS-CoV-2 RNA is generally detectable in upper respiratory specimens during the acute phase of infection. The lowest concentration of SARS-CoV-2 viral copies this assay can detect is 138 copies/mL. A negative result does not preclude SARS-Cov-2 infection and should not be used as the sole basis for treatment or other patient management decisions. A negative result may occur with  improper specimen collection/handling, submission of specimen other than nasopharyngeal swab, presence of viral mutation(s) within the areas targeted by this assay, and inadequate number of viral copies(<138 copies/mL). A negative result must be combined with clinical observations, patient history, and epidemiological information. The expected result is Negative.  Fact Sheet for Patients:  BloggerCourse.com  Fact Sheet for  Healthcare Providers:  SeriousBroker.it  This test is no t yet approved or cleared by the [place] FDA and  has been authorized for detection and/or diagnosis of SARS-CoV-2 by FDA under an Emergency Use Authorization (EUA). This EUA will remain  in effect (meaning this test can be used) for the duration of the COVID-19 declaration under Section 564(b)(1) of the Act, 21 U.S.C.section 360bbb-3(b)(1), unless the authorization is terminated  or revoked sooner.  Influenza A by PCR NEGATIVE NEGATIVE Final   Influenza B by PCR NEGATIVE NEGATIVE Final    Comment: (NOTE) The Xpert Xpress SARS-CoV-2/FLU/RSV plus assay is intended as an aid in the diagnosis of influenza from Nasopharyngeal swab specimens and should not be used as a sole basis for treatment. Nasal washings and aspirates are unacceptable for Xpert Xpress SARS-CoV-2/FLU/RSV testing.  Fact Sheet for Patients: BloggerCourse.com  Fact Sheet for Healthcare Providers: SeriousBroker.it  This test is not yet approved or cleared by the [place] FDA and has been authorized for detection and/or diagnosis of SARS-CoV-2 by FDA under an Emergency Use Authorization (EUA). This EUA will remain in effect (meaning this test can be used) for the duration of the COVID-19 declaration under Section 564(b)(1) of the Act, 21 U.S.C. section 360bbb-3(b)(1), unless the authorization is terminated or revoked.     Resp Syncytial Virus by PCR NEGATIVE NEGATIVE Final    Comment: (NOTE) Fact Sheet for Patients: BloggerCourse.com  Fact Sheet for Healthcare Providers: SeriousBroker.it  This test is not yet approved or cleared by the [place] FDA and has been authorized for detection and/or diagnosis of SARS-CoV-2 by FDA under an Emergency Use Authorization (EUA). This EUA will remain in effect (meaning this  test can be used) for the duration of the COVID-19 declaration under Section 564(b)(1) of the Act, 21 U.S.C. section 360bbb-3(b)(1), unless the authorization is terminated or revoked.  Performed at [facility], [address], [place], [place] [address]   Blood culture (routine x 2)     Status: None   Collection Time: 08/03/23  8:03 PM   Specimen: BLOOD  Result Value Ref Range Status   Specimen Description   Final    BLOOD RIGHT ANTECUBITAL Performed at [facility], [address] [place], [place] [address]    Special Requests   Final    BOTTLES DRAWN AEROBIC AND ANAEROBIC Blood Culture adequate volume Performed at [facility], [address], [place], [place] [address]    Culture   Final    NO GROWTH 5 DAYS Performed at [facility], [address], [place], [place] [address]    Report Status 08/09/2023 FINAL  Final  Blood culture (routine x 2)     Status: None   Collection Time: 08/03/23  8:04 PM   Specimen: BLOOD  Result Value Ref Range Status   Specimen Description   Final    BLOOD LEFT ANTECUBITAL Performed at [facility], [address] [place], [place] [address]    Special Requests   Final    BOTTLES DRAWN AEROBIC AND ANAEROBIC Blood Culture adequate volume Performed at [facility], [address] [place], [place] [address]    Culture   Final    NO GROWTH 5 DAYS Performed at [facility], [address], [place], [place] [address]    Report Status 08/09/2023 FINAL  Final  MRSA Next Gen by PCR, Nasal     Status: None   Collection Time: 08/03/23 10:54 PM   Specimen: Nasal Mucosa; Nasal Swab  Result Value Ref Range Status   MRSA by PCR Next Gen NOT DETECTED NOT DETECTED Final    Comment: (NOTE) The GeneXpert MRSA Assay (FDA approved for NASAL specimens only), is one component of a comprehensive MRSA colonization surveillance program. It is not  intended to diagnose MRSA infection nor to guide or monitor treatment for MRSA infections. Test performance is not FDA approved in patients less than  88 years old. Performed at [facility], [address] [place], [place] [address]     RADIOLOGY STUDIES/RESULTS: ECHOCARDIOGRAM LIMITED  Result Date: 08/09/2023    ECHOCARDIOGRAM LIMITED REPORT   Patient Name:   SABELA PFLIEGER Date of Exam: 08/09/2023 Medical Rec #:  [id number]      Height:       62.0 in Accession #:    [id number]     Weight:       173.5 lb Date of Birth:  02-10-1956     BSA:          1.800 m Patient Age:    66 years       BP:           131/71 mmHg Patient Gender: F              HR:           60 bpm. Exam Location:  Inpatient Procedure: Limited Echo, Limited Color Doppler, Intracardiac Opacification Agent            and Cardiac Doppler Indications:    I26.02 Pulmonary embolus  History:        Patient has prior history of Echocardiogram examinations, most                 recent 08/03/2023. CAD; Risk Factors:Diabetes, Hypertension and                 Dyslipidemia.  Sonographer:    Dondra Prader RVT RCS Referring Phys: [id number] DANIEL R BENSIMHON IMPRESSIONS  1. Left ventricular ejection fraction, by estimation, is 60 to 65%. The left ventricle has normal function. The left ventricle has no regional wall motion abnormalities.  2. RV McConnell's sign is noted. RV size is best appreciated on contrast images. Right ventricular systolic function is mildly reduced. The right ventricular size is dilated.  3. The mitral valve is grossly normal. No evidence of mitral valve regurgitation. No evidence of mitral stenosis.  4. Limited study. Comparison(s): No significant change from prior study. Prior images reviewed side by side. FINDINGS  Left Ventricle: Left ventricular ejection fraction, by estimation, is 60 to 65%. The left ventricle has normal function. The left ventricle has no regional wall motion abnormalities. Definity contrast agent was given IV  to delineate the left ventricular  endocardial borders. The left ventricular internal cavity size was normal in size. Right Ventricle: RV McConnell's sign is noted. RV size is best appreciated on contrast images. The right ventricular size is dilated. Right ventricular systolic function is mildly reduced. Mitral Valve: The mitral valve is grossly normal. No evidence of mitral valve stenosis. Pulmonary Artery: The pulmonary artery is of normal size. LEFT VENTRICLE PLAX 2D LVIDd:         3.70 cm LVIDs:         2.30 cm LV PW:         1.20 cm LV IVS:        1.20 cm  IVC IVC diam: 1.90 cm PULMONIC VALVE PV Vmax:       1.15 m/s PV Peak grad:  5.3 mmHg  Riley Lam MD Electronically signed by Riley Lam MD Signature Date/Time: 08/09/2023/4:18:01 PM    Final      LOS: 7 days   Jeoffrey Massed, MD  [facility]    To contact the attending provider between 7A-7P or the covering provider during after hours 7P-7A, please log into the web site [email or web address] and access using universal  [place] password for that web site. If you do not have the password, please call the hospital operator.  08/10/2023, 9:37 AM

## 2023-08-10 NOTE — Plan of Care (Signed)

## 2023-08-10 NOTE — Discharge Summary (Signed)
if you CANNOT make appointment. Contact information: [address] [place] [address] [phone]                Allergies  Allergen Reactions   Actos [Pioglitazone] Other (See Comments)    REACTION: pt states INTOL w/ edema   Codeine Other (See Comments)    REACTION: vomiting   Januvia [Sitagliptin] Nausea Only   Lactose Intolerance (Gi) Diarrhea   Morphine Nausea Only and Nausea And Vomiting    REACTION: vomiting REACTION: vomiting   Repatha [Evolocumab]     WAS NOT EFFECTIVE IN LOWERING LDL     Other Procedures/Studies: ECHOCARDIOGRAM LIMITED  Result Date: 08/09/2023    ECHOCARDIOGRAM LIMITED REPORT   Patient Name:   Jocelyn [name] Date of Exam: 08/09/2023 Medical Rec #:  [id number]      Height:       62.0 in Accession #:    [id number]     Weight:       173.5 lb Date of Birth:  11/07/1956     BSA:          1.800 m Patient Age:    66 years       BP:           131/71 mmHg Patient Gender: F              HR:           60 bpm. Exam Location:  Inpatient Procedure: Limited Echo, Limited Color Doppler, Intracardiac Opacification Agent            and Cardiac Doppler Indications:    I26.02 Pulmonary embolus  History:        Patient has prior history of Echocardiogram examinations, most                 recent 08/03/2023. CAD; Risk Factors:Diabetes, Hypertension and                 Dyslipidemia.   Sonographer:    Dondra Prader RVT RCS Referring Phys: [id number] DANIEL R BENSIMHON IMPRESSIONS  1. Left ventricular ejection fraction, by estimation, is 60 to 65%. The left ventricle has normal function. The left ventricle has no regional wall motion abnormalities.  2. RV McConnell's sign is noted. RV size is best appreciated on contrast images. Right ventricular systolic function is mildly reduced. The right ventricular size is dilated.  3. The mitral valve is grossly normal. No evidence of mitral valve regurgitation. No evidence of mitral stenosis.  4. Limited study. Comparison(s): No significant change from prior study. Prior images reviewed side by side. FINDINGS  Left Ventricle: Left ventricular ejection fraction, by estimation, is 60 to 65%. The left ventricle has normal function. The left ventricle has no regional wall motion abnormalities. Definity contrast agent was given IV to delineate the left ventricular  endocardial borders. The left ventricular internal cavity size was normal in size. Right Ventricle: RV McConnell's sign is noted. RV size is best appreciated on contrast images. The right ventricular size is dilated. Right ventricular systolic function is mildly reduced. Mitral Valve: The mitral valve is grossly normal. No evidence of mitral valve stenosis. Pulmonary Artery: The pulmonary artery is of normal size. LEFT VENTRICLE PLAX 2D LVIDd:         3.70 cm LVIDs:         2.30 cm LV PW:         1.20 cm LV IVS:  1.20 cm  IVC IVC diam: 1.90 cm PULMONIC VALVE PV Vmax:       1.15 m/s PV Peak grad:  5.3 mmHg  Riley Lam MD Electronically signed by Riley Lam MD Signature Date/Time: 08/09/2023/4:18:01 PM    Final    VAS [name] LOWER EXTREMITY VENOUS (DVT)  Result Date: 08/06/2023  Lower Venous DVT Study Patient Name:  Jocelyn [name]  Date of Exam:   08/05/2023 Medical Rec #: [id number]       Accession #:    [id number] Date of Birth: 29-Dec-1955      Patient Gender: F Patient Age:   5 years  Exam Location:  [facility] Procedure:      VAS [name] LOWER EXTREMITY VENOUS (DVT) Referring Phys: Melody Comas --------------------------------------------------------------------------------  Indications: Pulmonary embolism.  Limitations: Poor ultrasound/tissue interface and bandages. Comparison Study: No previous exams Performing Technologist: Jody Hill RVT, RDMS  Examination Guidelines: A complete evaluation includes B-mode imaging, spectral Doppler, color Doppler, and power Doppler as needed of all accessible portions of each vessel. Bilateral testing is considered an integral part of a complete examination. Limited examinations for reoccurring indications may be performed as noted. The reflux portion of the exam is performed with the patient in reverse Trendelenburg.  +---------+---------------+---------+-----------+----------+-------------------+ RIGHT    CompressibilityPhasicitySpontaneityPropertiesThrombus Aging      +---------+---------------+---------+-----------+----------+-------------------+ CFV                                                   Not visualized      +---------+---------------+---------+-----------+----------+-------------------+ SFJ                                                   Not visualized      +---------+---------------+---------+-----------+----------+-------------------+ FV Prox  None           No       No                   Acute               +---------+---------------+---------+-----------+----------+-------------------+ FV Mid   None           No       No                   Acute               +---------+---------------+---------+-----------+----------+-------------------+ FV DistalNone           No       No                   Acute               +---------+---------------+---------+-----------+----------+-------------------+ PFV      None           No       No                   Acute                +---------+---------------+---------+-----------+----------+-------------------+ POP      None           No       No  if you CANNOT make appointment. Contact information: [address] [place] [address] [phone]                Allergies  Allergen Reactions   Actos [Pioglitazone] Other (See Comments)    REACTION: pt states INTOL w/ edema   Codeine Other (See Comments)    REACTION: vomiting   Januvia [Sitagliptin] Nausea Only   Lactose Intolerance (Gi) Diarrhea   Morphine Nausea Only and Nausea And Vomiting    REACTION: vomiting REACTION: vomiting   Repatha [Evolocumab]     WAS NOT EFFECTIVE IN LOWERING LDL     Other Procedures/Studies: ECHOCARDIOGRAM LIMITED  Result Date: 08/09/2023    ECHOCARDIOGRAM LIMITED REPORT   Patient Name:   Jocelyn [name] Date of Exam: 08/09/2023 Medical Rec #:  [id number]      Height:       62.0 in Accession #:    [id number]     Weight:       173.5 lb Date of Birth:  11/07/1956     BSA:          1.800 m Patient Age:    66 years       BP:           131/71 mmHg Patient Gender: F              HR:           60 bpm. Exam Location:  Inpatient Procedure: Limited Echo, Limited Color Doppler, Intracardiac Opacification Agent            and Cardiac Doppler Indications:    I26.02 Pulmonary embolus  History:        Patient has prior history of Echocardiogram examinations, most                 recent 08/03/2023. CAD; Risk Factors:Diabetes, Hypertension and                 Dyslipidemia.   Sonographer:    Dondra Prader RVT RCS Referring Phys: [id number] DANIEL R BENSIMHON IMPRESSIONS  1. Left ventricular ejection fraction, by estimation, is 60 to 65%. The left ventricle has normal function. The left ventricle has no regional wall motion abnormalities.  2. RV McConnell's sign is noted. RV size is best appreciated on contrast images. Right ventricular systolic function is mildly reduced. The right ventricular size is dilated.  3. The mitral valve is grossly normal. No evidence of mitral valve regurgitation. No evidence of mitral stenosis.  4. Limited study. Comparison(s): No significant change from prior study. Prior images reviewed side by side. FINDINGS  Left Ventricle: Left ventricular ejection fraction, by estimation, is 60 to 65%. The left ventricle has normal function. The left ventricle has no regional wall motion abnormalities. Definity contrast agent was given IV to delineate the left ventricular  endocardial borders. The left ventricular internal cavity size was normal in size. Right Ventricle: RV McConnell's sign is noted. RV size is best appreciated on contrast images. The right ventricular size is dilated. Right ventricular systolic function is mildly reduced. Mitral Valve: The mitral valve is grossly normal. No evidence of mitral valve stenosis. Pulmonary Artery: The pulmonary artery is of normal size. LEFT VENTRICLE PLAX 2D LVIDd:         3.70 cm LVIDs:         2.30 cm LV PW:         1.20 cm LV IVS:  of infarction.   Left ventricular function is normal. Nuclear stress EF: 69%. The left ventricular ejection fraction is hyperdynamic (>65%). End diastolic cavity size is normal.   The study is normal. The study is low risk.     TODAY-DAY OF  DISCHARGE:  Subjective:   [employer] today has no headache,no chest abdominal pain,no new weakness tingling or numbness, feels much better wants to go home today.  Objective:   Blood pressure 118/78, pulse (!) 104, temperature 99 F (37.2 C), temperature source Oral, resp. rate 20, height 5\' 2"  (1.575 m), weight 73.7 kg, SpO2 95%. No intake or output data in the 24 hours ending 08/11/23 0952  Filed Weights   08/06/23 0500 08/07/23 0439 08/11/23 0400  Weight: 80.7 kg 78.7 kg 73.7 kg    Exam: Awake Alert, Oriented *3, No new F.N deficits, Normal affect [place].AT,PERRAL Supple Neck,No JVD, No cervical lymphadenopathy appriciated.  Symmetrical Chest wall movement, Good air movement bilaterally, CTAB RRR,No Gallops,Rubs or new Murmurs, No Parasternal Heave +ve B.Sounds, Abd Soft, Non tender, No organomegaly appriciated, No rebound -guarding or rigidity. No Cyanosis, Clubbing or edema, No new Rash or bruise   PERTINENT RADIOLOGIC STUDIES: ECHOCARDIOGRAM LIMITED  Result Date: 08/09/2023    ECHOCARDIOGRAM LIMITED REPORT   Patient Name:   Jocelyn [name] Date of Exam: 08/09/2023 Medical Rec #:  [id number]      Height:       62.0 in Accession #:    [id number]     Weight:       173.5 lb Date of Birth:  Aug 02, 1956     BSA:          1.800 m Patient Age:    66 years       BP:           131/71 mmHg Patient Gender: F              HR:           60 bpm. Exam Location:  Inpatient Procedure: Limited Echo, Limited Color Doppler, Intracardiac Opacification Agent            and Cardiac Doppler Indications:    I26.02 Pulmonary embolus  History:        Patient has prior history of Echocardiogram examinations, most                 recent 08/03/2023. CAD; Risk Factors:Diabetes, Hypertension and                 Dyslipidemia.  Sonographer:    Dondra Prader RVT RCS Referring Phys: [id number] DANIEL R BENSIMHON IMPRESSIONS  1. Left ventricular ejection fraction, by estimation, is 60 to 65%. The left ventricle has normal function.  The left ventricle has no regional wall motion abnormalities.  2. RV McConnell's sign is noted. RV size is best appreciated on contrast images. Right ventricular systolic function is mildly reduced. The right ventricular size is dilated.  3. The mitral valve is grossly normal. No evidence of mitral valve regurgitation. No evidence of mitral stenosis.  4. Limited study. Comparison(s): No significant change from prior study. Prior images reviewed side by side. FINDINGS  Left Ventricle: Left ventricular ejection fraction, by estimation, is 60 to 65%. The left ventricle has normal function. The left ventricle has no regional wall motion abnormalities. Definity contrast agent was given IV to delineate the left ventricular  endocardial borders. The left ventricular internal cavity size was normal in size. Right Ventricle: RV McConnell's sign is  of infarction.   Left ventricular function is normal. Nuclear stress EF: 69%. The left ventricular ejection fraction is hyperdynamic (>65%). End diastolic cavity size is normal.   The study is normal. The study is low risk.     TODAY-DAY OF  DISCHARGE:  Subjective:   [facility] today has no headache,no chest abdominal pain,no new weakness tingling or numbness, feels much better wants to go home today.  Objective:   Blood pressure 118/78, pulse (!) 104, temperature 99 F (37.2 C), temperature source Oral, resp. rate 20, height 5\' 2"  (1.575 m), weight 73.7 kg, SpO2 95%. No intake or output data in the 24 hours ending 08/11/23 0952  Filed Weights   08/06/23 0500 08/07/23 0439 08/11/23 0400  Weight: 80.7 kg 78.7 kg 73.7 kg    Exam: Awake Alert, Oriented *3, No new F.N deficits, Normal affect [place].AT,PERRAL Supple Neck,No JVD, No cervical lymphadenopathy appriciated.  Symmetrical Chest wall movement, Good air movement bilaterally, CTAB RRR,No Gallops,Rubs or new Murmurs, No Parasternal Heave +ve B.Sounds, Abd Soft, Non tender, No organomegaly appriciated, No rebound -guarding or rigidity. No Cyanosis, Clubbing or edema, No new Rash or bruise   PERTINENT RADIOLOGIC STUDIES: ECHOCARDIOGRAM LIMITED  Result Date: 08/09/2023    ECHOCARDIOGRAM LIMITED REPORT   Patient Name:   Jocelyn [name] Date of Exam: 08/09/2023 Medical Rec #:  [id number]      Height:       62.0 in Accession #:    [id number]     Weight:       173.5 lb Date of Birth:  Aug 02, 1956     BSA:          1.800 m Patient Age:    66 years       BP:           131/71 mmHg Patient Gender: F              HR:           60 bpm. Exam Location:  Inpatient Procedure: Limited Echo, Limited Color Doppler, Intracardiac Opacification Agent            and Cardiac Doppler Indications:    I26.02 Pulmonary embolus  History:        Patient has prior history of Echocardiogram examinations, most                 recent 08/03/2023. CAD; Risk Factors:Diabetes, Hypertension and                 Dyslipidemia.  Sonographer:    Dondra Prader RVT RCS Referring Phys: [id number] DANIEL R BENSIMHON IMPRESSIONS  1. Left ventricular ejection fraction, by estimation, is 60 to 65%. The left ventricle has normal function.  The left ventricle has no regional wall motion abnormalities.  2. RV McConnell's sign is noted. RV size is best appreciated on contrast images. Right ventricular systolic function is mildly reduced. The right ventricular size is dilated.  3. The mitral valve is grossly normal. No evidence of mitral valve regurgitation. No evidence of mitral stenosis.  4. Limited study. Comparison(s): No significant change from prior study. Prior images reviewed side by side. FINDINGS  Left Ventricle: Left ventricular ejection fraction, by estimation, is 60 to 65%. The left ventricle has normal function. The left ventricle has no regional wall motion abnormalities. Definity contrast agent was given IV to delineate the left ventricular  endocardial borders. The left ventricular internal cavity size was normal in size. Right Ventricle: RV McConnell's sign is  1.20 cm  IVC IVC diam: 1.90 cm PULMONIC VALVE PV Vmax:       1.15 m/s PV Peak grad:  5.3 mmHg  Riley Lam MD Electronically signed by Riley Lam MD Signature Date/Time: 08/09/2023/4:18:01 PM    Final    VAS [name] LOWER EXTREMITY VENOUS (DVT)  Result Date: 08/06/2023  Lower Venous DVT Study Patient Name:  Jocelyn [name]  Date of Exam:   08/05/2023 Medical Rec #: [id number]       Accession #:    [id number] Date of Birth: 29-Dec-1955      Patient Gender: F Patient Age:   5 years  Exam Location:  [facility] Procedure:      VAS [name] LOWER EXTREMITY VENOUS (DVT) Referring Phys: Melody Comas --------------------------------------------------------------------------------  Indications: Pulmonary embolism.  Limitations: Poor ultrasound/tissue interface and bandages. Comparison Study: No previous exams Performing Technologist: Jody Hill RVT, RDMS  Examination Guidelines: A complete evaluation includes B-mode imaging, spectral Doppler, color Doppler, and power Doppler as needed of all accessible portions of each vessel. Bilateral testing is considered an integral part of a complete examination. Limited examinations for reoccurring indications may be performed as noted. The reflux portion of the exam is performed with the patient in reverse Trendelenburg.  +---------+---------------+---------+-----------+----------+-------------------+ RIGHT    CompressibilityPhasicitySpontaneityPropertiesThrombus Aging      +---------+---------------+---------+-----------+----------+-------------------+ CFV                                                   Not visualized      +---------+---------------+---------+-----------+----------+-------------------+ SFJ                                                   Not visualized      +---------+---------------+---------+-----------+----------+-------------------+ FV Prox  None           No       No                   Acute               +---------+---------------+---------+-----------+----------+-------------------+ FV Mid   None           No       No                   Acute               +---------+---------------+---------+-----------+----------+-------------------+ FV DistalNone           No       No                   Acute               +---------+---------------+---------+-----------+----------+-------------------+ PFV      None           No       No                   Acute                +---------+---------------+---------+-----------+----------+-------------------+ POP      None           No       No  1.20 cm  IVC IVC diam: 1.90 cm PULMONIC VALVE PV Vmax:       1.15 m/s PV Peak grad:  5.3 mmHg  Riley Lam MD Electronically signed by Riley Lam MD Signature Date/Time: 08/09/2023/4:18:01 PM    Final    VAS [name] LOWER EXTREMITY VENOUS (DVT)  Result Date: 08/06/2023  Lower Venous DVT Study Patient Name:  Jocelyn [name]  Date of Exam:   08/05/2023 Medical Rec #: [id number]       Accession #:    [id number] Date of Birth: 29-Dec-1955      Patient Gender: F Patient Age:   5 years  Exam Location:  [facility] Procedure:      VAS [name] LOWER EXTREMITY VENOUS (DVT) Referring Phys: Melody Comas --------------------------------------------------------------------------------  Indications: Pulmonary embolism.  Limitations: Poor ultrasound/tissue interface and bandages. Comparison Study: No previous exams Performing Technologist: Jody Hill RVT, RDMS  Examination Guidelines: A complete evaluation includes B-mode imaging, spectral Doppler, color Doppler, and power Doppler as needed of all accessible portions of each vessel. Bilateral testing is considered an integral part of a complete examination. Limited examinations for reoccurring indications may be performed as noted. The reflux portion of the exam is performed with the patient in reverse Trendelenburg.  +---------+---------------+---------+-----------+----------+-------------------+ RIGHT    CompressibilityPhasicitySpontaneityPropertiesThrombus Aging      +---------+---------------+---------+-----------+----------+-------------------+ CFV                                                   Not visualized      +---------+---------------+---------+-----------+----------+-------------------+ SFJ                                                   Not visualized      +---------+---------------+---------+-----------+----------+-------------------+ FV Prox  None           No       No                   Acute               +---------+---------------+---------+-----------+----------+-------------------+ FV Mid   None           No       No                   Acute               +---------+---------------+---------+-----------+----------+-------------------+ FV DistalNone           No       No                   Acute               +---------+---------------+---------+-----------+----------+-------------------+ PFV      None           No       No                   Acute                +---------+---------------+---------+-----------+----------+-------------------+ POP      None           No       No  of infarction.   Left ventricular function is normal. Nuclear stress EF: 69%. The left ventricular ejection fraction is hyperdynamic (>65%). End diastolic cavity size is normal.   The study is normal. The study is low risk.     TODAY-DAY OF  DISCHARGE:  Subjective:   [facility] today has no headache,no chest abdominal pain,no new weakness tingling or numbness, feels much better wants to go home today.  Objective:   Blood pressure 118/78, pulse (!) 104, temperature 99 F (37.2 C), temperature source Oral, resp. rate 20, height 5\' 2"  (1.575 m), weight 73.7 kg, SpO2 95%. No intake or output data in the 24 hours ending 08/11/23 0952  Filed Weights   08/06/23 0500 08/07/23 0439 08/11/23 0400  Weight: 80.7 kg 78.7 kg 73.7 kg    Exam: Awake Alert, Oriented *3, No new F.N deficits, Normal affect [place].AT,PERRAL Supple Neck,No JVD, No cervical lymphadenopathy appriciated.  Symmetrical Chest wall movement, Good air movement bilaterally, CTAB RRR,No Gallops,Rubs or new Murmurs, No Parasternal Heave +ve B.Sounds, Abd Soft, Non tender, No organomegaly appriciated, No rebound -guarding or rigidity. No Cyanosis, Clubbing or edema, No new Rash or bruise   PERTINENT RADIOLOGIC STUDIES: ECHOCARDIOGRAM LIMITED  Result Date: 08/09/2023    ECHOCARDIOGRAM LIMITED REPORT   Patient Name:   Jocelyn [name] Date of Exam: 08/09/2023 Medical Rec #:  [id number]      Height:       62.0 in Accession #:    [id number]     Weight:       173.5 lb Date of Birth:  Aug 02, 1956     BSA:          1.800 m Patient Age:    66 years       BP:           131/71 mmHg Patient Gender: F              HR:           60 bpm. Exam Location:  Inpatient Procedure: Limited Echo, Limited Color Doppler, Intracardiac Opacification Agent            and Cardiac Doppler Indications:    I26.02 Pulmonary embolus  History:        Patient has prior history of Echocardiogram examinations, most                 recent 08/03/2023. CAD; Risk Factors:Diabetes, Hypertension and                 Dyslipidemia.  Sonographer:    Dondra Prader RVT RCS Referring Phys: [id number] DANIEL R BENSIMHON IMPRESSIONS  1. Left ventricular ejection fraction, by estimation, is 60 to 65%. The left ventricle has normal function.  The left ventricle has no regional wall motion abnormalities.  2. RV McConnell's sign is noted. RV size is best appreciated on contrast images. Right ventricular systolic function is mildly reduced. The right ventricular size is dilated.  3. The mitral valve is grossly normal. No evidence of mitral valve regurgitation. No evidence of mitral stenosis.  4. Limited study. Comparison(s): No significant change from prior study. Prior images reviewed side by side. FINDINGS  Left Ventricle: Left ventricular ejection fraction, by estimation, is 60 to 65%. The left ventricle has normal function. The left ventricle has no regional wall motion abnormalities. Definity contrast agent was given IV to delineate the left ventricular  endocardial borders. The left ventricular internal cavity size was normal in size. Right Ventricle: RV McConnell's sign is  1.20 cm  IVC IVC diam: 1.90 cm PULMONIC VALVE PV Vmax:       1.15 m/s PV Peak grad:  5.3 mmHg  Riley Lam MD Electronically signed by Riley Lam MD Signature Date/Time: 08/09/2023/4:18:01 PM    Final    VAS [name] LOWER EXTREMITY VENOUS (DVT)  Result Date: 08/06/2023  Lower Venous DVT Study Patient Name:  Jocelyn [name]  Date of Exam:   08/05/2023 Medical Rec #: [id number]       Accession #:    [id number] Date of Birth: 29-Dec-1955      Patient Gender: F Patient Age:   5 years  Exam Location:  [facility] Procedure:      VAS [name] LOWER EXTREMITY VENOUS (DVT) Referring Phys: Melody Comas --------------------------------------------------------------------------------  Indications: Pulmonary embolism.  Limitations: Poor ultrasound/tissue interface and bandages. Comparison Study: No previous exams Performing Technologist: Jody Hill RVT, RDMS  Examination Guidelines: A complete evaluation includes B-mode imaging, spectral Doppler, color Doppler, and power Doppler as needed of all accessible portions of each vessel. Bilateral testing is considered an integral part of a complete examination. Limited examinations for reoccurring indications may be performed as noted. The reflux portion of the exam is performed with the patient in reverse Trendelenburg.  +---------+---------------+---------+-----------+----------+-------------------+ RIGHT    CompressibilityPhasicitySpontaneityPropertiesThrombus Aging      +---------+---------------+---------+-----------+----------+-------------------+ CFV                                                   Not visualized      +---------+---------------+---------+-----------+----------+-------------------+ SFJ                                                   Not visualized      +---------+---------------+---------+-----------+----------+-------------------+ FV Prox  None           No       No                   Acute               +---------+---------------+---------+-----------+----------+-------------------+ FV Mid   None           No       No                   Acute               +---------+---------------+---------+-----------+----------+-------------------+ FV DistalNone           No       No                   Acute               +---------+---------------+---------+-----------+----------+-------------------+ PFV      None           No       No                   Acute                +---------+---------------+---------+-----------+----------+-------------------+ POP      None           No       No  of infarction.   Left ventricular function is normal. Nuclear stress EF: 69%. The left ventricular ejection fraction is hyperdynamic (>65%). End diastolic cavity size is normal.   The study is normal. The study is low risk.     TODAY-DAY OF  DISCHARGE:  Subjective:   [facility] today has no headache,no chest abdominal pain,no new weakness tingling or numbness, feels much better wants to go home today.  Objective:   Blood pressure 118/78, pulse (!) 104, temperature 99 F (37.2 C), temperature source Oral, resp. rate 20, height 5\' 2"  (1.575 m), weight 73.7 kg, SpO2 95%. No intake or output data in the 24 hours ending 08/11/23 0952  Filed Weights   08/06/23 0500 08/07/23 0439 08/11/23 0400  Weight: 80.7 kg 78.7 kg 73.7 kg    Exam: Awake Alert, Oriented *3, No new F.N deficits, Normal affect [place].AT,PERRAL Supple Neck,No JVD, No cervical lymphadenopathy appriciated.  Symmetrical Chest wall movement, Good air movement bilaterally, CTAB RRR,No Gallops,Rubs or new Murmurs, No Parasternal Heave +ve B.Sounds, Abd Soft, Non tender, No organomegaly appriciated, No rebound -guarding or rigidity. No Cyanosis, Clubbing or edema, No new Rash or bruise   PERTINENT RADIOLOGIC STUDIES: ECHOCARDIOGRAM LIMITED  Result Date: 08/09/2023    ECHOCARDIOGRAM LIMITED REPORT   Patient Name:   Jocelyn [name] Date of Exam: 08/09/2023 Medical Rec #:  [id number]      Height:       62.0 in Accession #:    [id number]     Weight:       173.5 lb Date of Birth:  Aug 02, 1956     BSA:          1.800 m Patient Age:    66 years       BP:           131/71 mmHg Patient Gender: F              HR:           60 bpm. Exam Location:  Inpatient Procedure: Limited Echo, Limited Color Doppler, Intracardiac Opacification Agent            and Cardiac Doppler Indications:    I26.02 Pulmonary embolus  History:        Patient has prior history of Echocardiogram examinations, most                 recent 08/03/2023. CAD; Risk Factors:Diabetes, Hypertension and                 Dyslipidemia.  Sonographer:    Dondra Prader RVT RCS Referring Phys: [id number] DANIEL R BENSIMHON IMPRESSIONS  1. Left ventricular ejection fraction, by estimation, is 60 to 65%. The left ventricle has normal function.  The left ventricle has no regional wall motion abnormalities.  2. RV McConnell's sign is noted. RV size is best appreciated on contrast images. Right ventricular systolic function is mildly reduced. The right ventricular size is dilated.  3. The mitral valve is grossly normal. No evidence of mitral valve regurgitation. No evidence of mitral stenosis.  4. Limited study. Comparison(s): No significant change from prior study. Prior images reviewed side by side. FINDINGS  Left Ventricle: Left ventricular ejection fraction, by estimation, is 60 to 65%. The left ventricle has normal function. The left ventricle has no regional wall motion abnormalities. Definity contrast agent was given IV to delineate the left ventricular  endocardial borders. The left ventricular internal cavity size was normal in size. Right Ventricle: RV McConnell's sign is  if you CANNOT make appointment. Contact information: [address] [place] [address] [phone]                Allergies  Allergen Reactions   Actos [Pioglitazone] Other (See Comments)    REACTION: pt states INTOL w/ edema   Codeine Other (See Comments)    REACTION: vomiting   Januvia [Sitagliptin] Nausea Only   Lactose Intolerance (Gi) Diarrhea   Morphine Nausea Only and Nausea And Vomiting    REACTION: vomiting REACTION: vomiting   Repatha [Evolocumab]     WAS NOT EFFECTIVE IN LOWERING LDL     Other Procedures/Studies: ECHOCARDIOGRAM LIMITED  Result Date: 08/09/2023    ECHOCARDIOGRAM LIMITED REPORT   Patient Name:   Jocelyn [name] Date of Exam: 08/09/2023 Medical Rec #:  [id number]      Height:       62.0 in Accession #:    [id number]     Weight:       173.5 lb Date of Birth:  11/07/1956     BSA:          1.800 m Patient Age:    66 years       BP:           131/71 mmHg Patient Gender: F              HR:           60 bpm. Exam Location:  Inpatient Procedure: Limited Echo, Limited Color Doppler, Intracardiac Opacification Agent            and Cardiac Doppler Indications:    I26.02 Pulmonary embolus  History:        Patient has prior history of Echocardiogram examinations, most                 recent 08/03/2023. CAD; Risk Factors:Diabetes, Hypertension and                 Dyslipidemia.   Sonographer:    Dondra Prader RVT RCS Referring Phys: [id number] DANIEL R BENSIMHON IMPRESSIONS  1. Left ventricular ejection fraction, by estimation, is 60 to 65%. The left ventricle has normal function. The left ventricle has no regional wall motion abnormalities.  2. RV McConnell's sign is noted. RV size is best appreciated on contrast images. Right ventricular systolic function is mildly reduced. The right ventricular size is dilated.  3. The mitral valve is grossly normal. No evidence of mitral valve regurgitation. No evidence of mitral stenosis.  4. Limited study. Comparison(s): No significant change from prior study. Prior images reviewed side by side. FINDINGS  Left Ventricle: Left ventricular ejection fraction, by estimation, is 60 to 65%. The left ventricle has normal function. The left ventricle has no regional wall motion abnormalities. Definity contrast agent was given IV to delineate the left ventricular  endocardial borders. The left ventricular internal cavity size was normal in size. Right Ventricle: RV McConnell's sign is noted. RV size is best appreciated on contrast images. The right ventricular size is dilated. Right ventricular systolic function is mildly reduced. Mitral Valve: The mitral valve is grossly normal. No evidence of mitral valve stenosis. Pulmonary Artery: The pulmonary artery is of normal size. LEFT VENTRICLE PLAX 2D LVIDd:         3.70 cm LVIDs:         2.30 cm LV PW:         1.20 cm LV IVS:  1.20 cm  IVC IVC diam: 1.90 cm PULMONIC VALVE PV Vmax:       1.15 m/s PV Peak grad:  5.3 mmHg  Riley Lam MD Electronically signed by Riley Lam MD Signature Date/Time: 08/09/2023/4:18:01 PM    Final    VAS [name] LOWER EXTREMITY VENOUS (DVT)  Result Date: 08/06/2023  Lower Venous DVT Study Patient Name:  Jocelyn [name]  Date of Exam:   08/05/2023 Medical Rec #: [id number]       Accession #:    [id number] Date of Birth: 29-Dec-1955      Patient Gender: F Patient Age:   5 years  Exam Location:  [facility] Procedure:      VAS [name] LOWER EXTREMITY VENOUS (DVT) Referring Phys: Melody Comas --------------------------------------------------------------------------------  Indications: Pulmonary embolism.  Limitations: Poor ultrasound/tissue interface and bandages. Comparison Study: No previous exams Performing Technologist: Jody Hill RVT, RDMS  Examination Guidelines: A complete evaluation includes B-mode imaging, spectral Doppler, color Doppler, and power Doppler as needed of all accessible portions of each vessel. Bilateral testing is considered an integral part of a complete examination. Limited examinations for reoccurring indications may be performed as noted. The reflux portion of the exam is performed with the patient in reverse Trendelenburg.  +---------+---------------+---------+-----------+----------+-------------------+ RIGHT    CompressibilityPhasicitySpontaneityPropertiesThrombus Aging      +---------+---------------+---------+-----------+----------+-------------------+ CFV                                                   Not visualized      +---------+---------------+---------+-----------+----------+-------------------+ SFJ                                                   Not visualized      +---------+---------------+---------+-----------+----------+-------------------+ FV Prox  None           No       No                   Acute               +---------+---------------+---------+-----------+----------+-------------------+ FV Mid   None           No       No                   Acute               +---------+---------------+---------+-----------+----------+-------------------+ FV DistalNone           No       No                   Acute               +---------+---------------+---------+-----------+----------+-------------------+ PFV      None           No       No                   Acute                +---------+---------------+---------+-----------+----------+-------------------+ POP      None           No       No  1.20 cm  IVC IVC diam: 1.90 cm PULMONIC VALVE PV Vmax:       1.15 m/s PV Peak grad:  5.3 mmHg  Riley Lam MD Electronically signed by Riley Lam MD Signature Date/Time: 08/09/2023/4:18:01 PM    Final    VAS [name] LOWER EXTREMITY VENOUS (DVT)  Result Date: 08/06/2023  Lower Venous DVT Study Patient Name:  Jocelyn [name]  Date of Exam:   08/05/2023 Medical Rec #: [id number]       Accession #:    [id number] Date of Birth: 29-Dec-1955      Patient Gender: F Patient Age:   5 years  Exam Location:  [facility] Procedure:      VAS [name] LOWER EXTREMITY VENOUS (DVT) Referring Phys: Melody Comas --------------------------------------------------------------------------------  Indications: Pulmonary embolism.  Limitations: Poor ultrasound/tissue interface and bandages. Comparison Study: No previous exams Performing Technologist: Jody Hill RVT, RDMS  Examination Guidelines: A complete evaluation includes B-mode imaging, spectral Doppler, color Doppler, and power Doppler as needed of all accessible portions of each vessel. Bilateral testing is considered an integral part of a complete examination. Limited examinations for reoccurring indications may be performed as noted. The reflux portion of the exam is performed with the patient in reverse Trendelenburg.  +---------+---------------+---------+-----------+----------+-------------------+ RIGHT    CompressibilityPhasicitySpontaneityPropertiesThrombus Aging      +---------+---------------+---------+-----------+----------+-------------------+ CFV                                                   Not visualized      +---------+---------------+---------+-----------+----------+-------------------+ SFJ                                                   Not visualized      +---------+---------------+---------+-----------+----------+-------------------+ FV Prox  None           No       No                   Acute               +---------+---------------+---------+-----------+----------+-------------------+ FV Mid   None           No       No                   Acute               +---------+---------------+---------+-----------+----------+-------------------+ FV DistalNone           No       No                   Acute               +---------+---------------+---------+-----------+----------+-------------------+ PFV      None           No       No                   Acute                +---------+---------------+---------+-----------+----------+-------------------+ POP      None           No       No  if you CANNOT make appointment. Contact information: [address] [place] [address] [phone]                Allergies  Allergen Reactions   Actos [Pioglitazone] Other (See Comments)    REACTION: pt states INTOL w/ edema   Codeine Other (See Comments)    REACTION: vomiting   Januvia [Sitagliptin] Nausea Only   Lactose Intolerance (Gi) Diarrhea   Morphine Nausea Only and Nausea And Vomiting    REACTION: vomiting REACTION: vomiting   Repatha [Evolocumab]     WAS NOT EFFECTIVE IN LOWERING LDL     Other Procedures/Studies: ECHOCARDIOGRAM LIMITED  Result Date: 08/09/2023    ECHOCARDIOGRAM LIMITED REPORT   Patient Name:   Jocelyn [name] Date of Exam: 08/09/2023 Medical Rec #:  [id number]      Height:       62.0 in Accession #:    [id number]     Weight:       173.5 lb Date of Birth:  11/07/1956     BSA:          1.800 m Patient Age:    66 years       BP:           131/71 mmHg Patient Gender: F              HR:           60 bpm. Exam Location:  Inpatient Procedure: Limited Echo, Limited Color Doppler, Intracardiac Opacification Agent            and Cardiac Doppler Indications:    I26.02 Pulmonary embolus  History:        Patient has prior history of Echocardiogram examinations, most                 recent 08/03/2023. CAD; Risk Factors:Diabetes, Hypertension and                 Dyslipidemia.   Sonographer:    Dondra Prader RVT RCS Referring Phys: [id number] DANIEL R BENSIMHON IMPRESSIONS  1. Left ventricular ejection fraction, by estimation, is 60 to 65%. The left ventricle has normal function. The left ventricle has no regional wall motion abnormalities.  2. RV McConnell's sign is noted. RV size is best appreciated on contrast images. Right ventricular systolic function is mildly reduced. The right ventricular size is dilated.  3. The mitral valve is grossly normal. No evidence of mitral valve regurgitation. No evidence of mitral stenosis.  4. Limited study. Comparison(s): No significant change from prior study. Prior images reviewed side by side. FINDINGS  Left Ventricle: Left ventricular ejection fraction, by estimation, is 60 to 65%. The left ventricle has normal function. The left ventricle has no regional wall motion abnormalities. Definity contrast agent was given IV to delineate the left ventricular  endocardial borders. The left ventricular internal cavity size was normal in size. Right Ventricle: RV McConnell's sign is noted. RV size is best appreciated on contrast images. The right ventricular size is dilated. Right ventricular systolic function is mildly reduced. Mitral Valve: The mitral valve is grossly normal. No evidence of mitral valve stenosis. Pulmonary Artery: The pulmonary artery is of normal size. LEFT VENTRICLE PLAX 2D LVIDd:         3.70 cm LVIDs:         2.30 cm LV PW:         1.20 cm LV IVS:  of infarction.   Left ventricular function is normal. Nuclear stress EF: 69%. The left ventricular ejection fraction is hyperdynamic (>65%). End diastolic cavity size is normal.   The study is normal. The study is low risk.     TODAY-DAY OF  DISCHARGE:  Subjective:   [facility] today has no headache,no chest abdominal pain,no new weakness tingling or numbness, feels much better wants to go home today.  Objective:   Blood pressure 118/78, pulse (!) 104, temperature 99 F (37.2 C), temperature source Oral, resp. rate 20, height 5\' 2"  (1.575 m), weight 73.7 kg, SpO2 95%. No intake or output data in the 24 hours ending 08/11/23 0952  Filed Weights   08/06/23 0500 08/07/23 0439 08/11/23 0400  Weight: 80.7 kg 78.7 kg 73.7 kg    Exam: Awake Alert, Oriented *3, No new F.N deficits, Normal affect [place].AT,PERRAL Supple Neck,No JVD, No cervical lymphadenopathy appriciated.  Symmetrical Chest wall movement, Good air movement bilaterally, CTAB RRR,No Gallops,Rubs or new Murmurs, No Parasternal Heave +ve B.Sounds, Abd Soft, Non tender, No organomegaly appriciated, No rebound -guarding or rigidity. No Cyanosis, Clubbing or edema, No new Rash or bruise   PERTINENT RADIOLOGIC STUDIES: ECHOCARDIOGRAM LIMITED  Result Date: 08/09/2023    ECHOCARDIOGRAM LIMITED REPORT   Patient Name:   Jocelyn [name] Date of Exam: 08/09/2023 Medical Rec #:  [id number]      Height:       62.0 in Accession #:    [id number]     Weight:       173.5 lb Date of Birth:  Aug 02, 1956     BSA:          1.800 m Patient Age:    66 years       BP:           131/71 mmHg Patient Gender: F              HR:           60 bpm. Exam Location:  Inpatient Procedure: Limited Echo, Limited Color Doppler, Intracardiac Opacification Agent            and Cardiac Doppler Indications:    I26.02 Pulmonary embolus  History:        Patient has prior history of Echocardiogram examinations, most                 recent 08/03/2023. CAD; Risk Factors:Diabetes, Hypertension and                 Dyslipidemia.  Sonographer:    Dondra Prader RVT RCS Referring Phys: [id number] DANIEL R BENSIMHON IMPRESSIONS  1. Left ventricular ejection fraction, by estimation, is 60 to 65%. The left ventricle has normal function.  The left ventricle has no regional wall motion abnormalities.  2. RV McConnell's sign is noted. RV size is best appreciated on contrast images. Right ventricular systolic function is mildly reduced. The right ventricular size is dilated.  3. The mitral valve is grossly normal. No evidence of mitral valve regurgitation. No evidence of mitral stenosis.  4. Limited study. Comparison(s): No significant change from prior study. Prior images reviewed side by side. FINDINGS  Left Ventricle: Left ventricular ejection fraction, by estimation, is 60 to 65%. The left ventricle has normal function. The left ventricle has no regional wall motion abnormalities. Definity contrast agent was given IV to delineate the left ventricular  endocardial borders. The left ventricular internal cavity size was normal in size. Right Ventricle: RV McConnell's sign is  1.20 cm  IVC IVC diam: 1.90 cm PULMONIC VALVE PV Vmax:       1.15 m/s PV Peak grad:  5.3 mmHg  Riley Lam MD Electronically signed by Riley Lam MD Signature Date/Time: 08/09/2023/4:18:01 PM    Final    VAS [name] LOWER EXTREMITY VENOUS (DVT)  Result Date: 08/06/2023  Lower Venous DVT Study Patient Name:  Jocelyn [name]  Date of Exam:   08/05/2023 Medical Rec #: [id number]       Accession #:    [id number] Date of Birth: 29-Dec-1955      Patient Gender: F Patient Age:   5 years  Exam Location:  [facility] Procedure:      VAS [name] LOWER EXTREMITY VENOUS (DVT) Referring Phys: Melody Comas --------------------------------------------------------------------------------  Indications: Pulmonary embolism.  Limitations: Poor ultrasound/tissue interface and bandages. Comparison Study: No previous exams Performing Technologist: Jody Hill RVT, RDMS  Examination Guidelines: A complete evaluation includes B-mode imaging, spectral Doppler, color Doppler, and power Doppler as needed of all accessible portions of each vessel. Bilateral testing is considered an integral part of a complete examination. Limited examinations for reoccurring indications may be performed as noted. The reflux portion of the exam is performed with the patient in reverse Trendelenburg.  +---------+---------------+---------+-----------+----------+-------------------+ RIGHT    CompressibilityPhasicitySpontaneityPropertiesThrombus Aging      +---------+---------------+---------+-----------+----------+-------------------+ CFV                                                   Not visualized      +---------+---------------+---------+-----------+----------+-------------------+ SFJ                                                   Not visualized      +---------+---------------+---------+-----------+----------+-------------------+ FV Prox  None           No       No                   Acute               +---------+---------------+---------+-----------+----------+-------------------+ FV Mid   None           No       No                   Acute               +---------+---------------+---------+-----------+----------+-------------------+ FV DistalNone           No       No                   Acute               +---------+---------------+---------+-----------+----------+-------------------+ PFV      None           No       No                   Acute                +---------+---------------+---------+-----------+----------+-------------------+ POP      None           No       No  1.20 cm  IVC IVC diam: 1.90 cm PULMONIC VALVE PV Vmax:       1.15 m/s PV Peak grad:  5.3 mmHg  Riley Lam MD Electronically signed by Riley Lam MD Signature Date/Time: 08/09/2023/4:18:01 PM    Final    VAS [name] LOWER EXTREMITY VENOUS (DVT)  Result Date: 08/06/2023  Lower Venous DVT Study Patient Name:  Jocelyn [name]  Date of Exam:   08/05/2023 Medical Rec #: [id number]       Accession #:    [id number] Date of Birth: 29-Dec-1955      Patient Gender: F Patient Age:   5 years  Exam Location:  [facility] Procedure:      VAS [name] LOWER EXTREMITY VENOUS (DVT) Referring Phys: Melody Comas --------------------------------------------------------------------------------  Indications: Pulmonary embolism.  Limitations: Poor ultrasound/tissue interface and bandages. Comparison Study: No previous exams Performing Technologist: Jody Hill RVT, RDMS  Examination Guidelines: A complete evaluation includes B-mode imaging, spectral Doppler, color Doppler, and power Doppler as needed of all accessible portions of each vessel. Bilateral testing is considered an integral part of a complete examination. Limited examinations for reoccurring indications may be performed as noted. The reflux portion of the exam is performed with the patient in reverse Trendelenburg.  +---------+---------------+---------+-----------+----------+-------------------+ RIGHT    CompressibilityPhasicitySpontaneityPropertiesThrombus Aging      +---------+---------------+---------+-----------+----------+-------------------+ CFV                                                   Not visualized      +---------+---------------+---------+-----------+----------+-------------------+ SFJ                                                   Not visualized      +---------+---------------+---------+-----------+----------+-------------------+ FV Prox  None           No       No                   Acute               +---------+---------------+---------+-----------+----------+-------------------+ FV Mid   None           No       No                   Acute               +---------+---------------+---------+-----------+----------+-------------------+ FV DistalNone           No       No                   Acute               +---------+---------------+---------+-----------+----------+-------------------+ PFV      None           No       No                   Acute                +---------+---------------+---------+-----------+----------+-------------------+ POP      None           No       No  if you CANNOT make appointment. Contact information: [address] [place] [address] [phone]                Allergies  Allergen Reactions   Actos [Pioglitazone] Other (See Comments)    REACTION: pt states INTOL w/ edema   Codeine Other (See Comments)    REACTION: vomiting   Januvia [Sitagliptin] Nausea Only   Lactose Intolerance (Gi) Diarrhea   Morphine Nausea Only and Nausea And Vomiting    REACTION: vomiting REACTION: vomiting   Repatha [Evolocumab]     WAS NOT EFFECTIVE IN LOWERING LDL     Other Procedures/Studies: ECHOCARDIOGRAM LIMITED  Result Date: 08/09/2023    ECHOCARDIOGRAM LIMITED REPORT   Patient Name:   Jocelyn [name] Date of Exam: 08/09/2023 Medical Rec #:  [id number]      Height:       62.0 in Accession #:    [id number]     Weight:       173.5 lb Date of Birth:  11/07/1956     BSA:          1.800 m Patient Age:    66 years       BP:           131/71 mmHg Patient Gender: F              HR:           60 bpm. Exam Location:  Inpatient Procedure: Limited Echo, Limited Color Doppler, Intracardiac Opacification Agent            and Cardiac Doppler Indications:    I26.02 Pulmonary embolus  History:        Patient has prior history of Echocardiogram examinations, most                 recent 08/03/2023. CAD; Risk Factors:Diabetes, Hypertension and                 Dyslipidemia.   Sonographer:    Dondra Prader RVT RCS Referring Phys: [id number] DANIEL R BENSIMHON IMPRESSIONS  1. Left ventricular ejection fraction, by estimation, is 60 to 65%. The left ventricle has normal function. The left ventricle has no regional wall motion abnormalities.  2. RV McConnell's sign is noted. RV size is best appreciated on contrast images. Right ventricular systolic function is mildly reduced. The right ventricular size is dilated.  3. The mitral valve is grossly normal. No evidence of mitral valve regurgitation. No evidence of mitral stenosis.  4. Limited study. Comparison(s): No significant change from prior study. Prior images reviewed side by side. FINDINGS  Left Ventricle: Left ventricular ejection fraction, by estimation, is 60 to 65%. The left ventricle has normal function. The left ventricle has no regional wall motion abnormalities. Definity contrast agent was given IV to delineate the left ventricular  endocardial borders. The left ventricular internal cavity size was normal in size. Right Ventricle: RV McConnell's sign is noted. RV size is best appreciated on contrast images. The right ventricular size is dilated. Right ventricular systolic function is mildly reduced. Mitral Valve: The mitral valve is grossly normal. No evidence of mitral valve stenosis. Pulmonary Artery: The pulmonary artery is of normal size. LEFT VENTRICLE PLAX 2D LVIDd:         3.70 cm LVIDs:         2.30 cm LV PW:         1.20 cm LV IVS:  if you CANNOT make appointment. Contact information: [address] [place] [address] [phone]                Allergies  Allergen Reactions   Actos [Pioglitazone] Other (See Comments)    REACTION: pt states INTOL w/ edema   Codeine Other (See Comments)    REACTION: vomiting   Januvia [Sitagliptin] Nausea Only   Lactose Intolerance (Gi) Diarrhea   Morphine Nausea Only and Nausea And Vomiting    REACTION: vomiting REACTION: vomiting   Repatha [Evolocumab]     WAS NOT EFFECTIVE IN LOWERING LDL     Other Procedures/Studies: ECHOCARDIOGRAM LIMITED  Result Date: 08/09/2023    ECHOCARDIOGRAM LIMITED REPORT   Patient Name:   Jocelyn [name] Date of Exam: 08/09/2023 Medical Rec #:  [id number]      Height:       62.0 in Accession #:    [id number]     Weight:       173.5 lb Date of Birth:  11/07/1956     BSA:          1.800 m Patient Age:    66 years       BP:           131/71 mmHg Patient Gender: F              HR:           60 bpm. Exam Location:  Inpatient Procedure: Limited Echo, Limited Color Doppler, Intracardiac Opacification Agent            and Cardiac Doppler Indications:    I26.02 Pulmonary embolus  History:        Patient has prior history of Echocardiogram examinations, most                 recent 08/03/2023. CAD; Risk Factors:Diabetes, Hypertension and                 Dyslipidemia.   Sonographer:    Dondra Prader RVT RCS Referring Phys: [id number] DANIEL R BENSIMHON IMPRESSIONS  1. Left ventricular ejection fraction, by estimation, is 60 to 65%. The left ventricle has normal function. The left ventricle has no regional wall motion abnormalities.  2. RV McConnell's sign is noted. RV size is best appreciated on contrast images. Right ventricular systolic function is mildly reduced. The right ventricular size is dilated.  3. The mitral valve is grossly normal. No evidence of mitral valve regurgitation. No evidence of mitral stenosis.  4. Limited study. Comparison(s): No significant change from prior study. Prior images reviewed side by side. FINDINGS  Left Ventricle: Left ventricular ejection fraction, by estimation, is 60 to 65%. The left ventricle has normal function. The left ventricle has no regional wall motion abnormalities. Definity contrast agent was given IV to delineate the left ventricular  endocardial borders. The left ventricular internal cavity size was normal in size. Right Ventricle: RV McConnell's sign is noted. RV size is best appreciated on contrast images. The right ventricular size is dilated. Right ventricular systolic function is mildly reduced. Mitral Valve: The mitral valve is grossly normal. No evidence of mitral valve stenosis. Pulmonary Artery: The pulmonary artery is of normal size. LEFT VENTRICLE PLAX 2D LVIDd:         3.70 cm LVIDs:         2.30 cm LV PW:         1.20 cm LV IVS:  of infarction.   Left ventricular function is normal. Nuclear stress EF: 69%. The left ventricular ejection fraction is hyperdynamic (>65%). End diastolic cavity size is normal.   The study is normal. The study is low risk.     TODAY-DAY OF  DISCHARGE:  Subjective:   [employer] today has no headache,no chest abdominal pain,no new weakness tingling or numbness, feels much better wants to go home today.  Objective:   Blood pressure 118/78, pulse (!) 104, temperature 99 F (37.2 C), temperature source Oral, resp. rate 20, height 5\' 2"  (1.575 m), weight 73.7 kg, SpO2 95%. No intake or output data in the 24 hours ending 08/11/23 0952  Filed Weights   08/06/23 0500 08/07/23 0439 08/11/23 0400  Weight: 80.7 kg 78.7 kg 73.7 kg    Exam: Awake Alert, Oriented *3, No new F.N deficits, Normal affect [place].AT,PERRAL Supple Neck,No JVD, No cervical lymphadenopathy appriciated.  Symmetrical Chest wall movement, Good air movement bilaterally, CTAB RRR,No Gallops,Rubs or new Murmurs, No Parasternal Heave +ve B.Sounds, Abd Soft, Non tender, No organomegaly appriciated, No rebound -guarding or rigidity. No Cyanosis, Clubbing or edema, No new Rash or bruise   PERTINENT RADIOLOGIC STUDIES: ECHOCARDIOGRAM LIMITED  Result Date: 08/09/2023    ECHOCARDIOGRAM LIMITED REPORT   Patient Name:   Jocelyn [name] Date of Exam: 08/09/2023 Medical Rec #:  [id number]      Height:       62.0 in Accession #:    [id number]     Weight:       173.5 lb Date of Birth:  Aug 02, 1956     BSA:          1.800 m Patient Age:    66 years       BP:           131/71 mmHg Patient Gender: F              HR:           60 bpm. Exam Location:  Inpatient Procedure: Limited Echo, Limited Color Doppler, Intracardiac Opacification Agent            and Cardiac Doppler Indications:    I26.02 Pulmonary embolus  History:        Patient has prior history of Echocardiogram examinations, most                 recent 08/03/2023. CAD; Risk Factors:Diabetes, Hypertension and                 Dyslipidemia.  Sonographer:    Dondra Prader RVT RCS Referring Phys: [id number] DANIEL R BENSIMHON IMPRESSIONS  1. Left ventricular ejection fraction, by estimation, is 60 to 65%. The left ventricle has normal function.  The left ventricle has no regional wall motion abnormalities.  2. RV McConnell's sign is noted. RV size is best appreciated on contrast images. Right ventricular systolic function is mildly reduced. The right ventricular size is dilated.  3. The mitral valve is grossly normal. No evidence of mitral valve regurgitation. No evidence of mitral stenosis.  4. Limited study. Comparison(s): No significant change from prior study. Prior images reviewed side by side. FINDINGS  Left Ventricle: Left ventricular ejection fraction, by estimation, is 60 to 65%. The left ventricle has normal function. The left ventricle has no regional wall motion abnormalities. Definity contrast agent was given IV to delineate the left ventricular  endocardial borders. The left ventricular internal cavity size was normal in size. Right Ventricle: RV McConnell's sign is  1.20 cm  IVC IVC diam: 1.90 cm PULMONIC VALVE PV Vmax:       1.15 m/s PV Peak grad:  5.3 mmHg  Riley Lam MD Electronically signed by Riley Lam MD Signature Date/Time: 08/09/2023/4:18:01 PM    Final    VAS [name] LOWER EXTREMITY VENOUS (DVT)  Result Date: 08/06/2023  Lower Venous DVT Study Patient Name:  Jocelyn [name]  Date of Exam:   08/05/2023 Medical Rec #: [id number]       Accession #:    [id number] Date of Birth: 29-Dec-1955      Patient Gender: F Patient Age:   5 years  Exam Location:  [facility] Procedure:      VAS [name] LOWER EXTREMITY VENOUS (DVT) Referring Phys: Melody Comas --------------------------------------------------------------------------------  Indications: Pulmonary embolism.  Limitations: Poor ultrasound/tissue interface and bandages. Comparison Study: No previous exams Performing Technologist: Jody Hill RVT, RDMS  Examination Guidelines: A complete evaluation includes B-mode imaging, spectral Doppler, color Doppler, and power Doppler as needed of all accessible portions of each vessel. Bilateral testing is considered an integral part of a complete examination. Limited examinations for reoccurring indications may be performed as noted. The reflux portion of the exam is performed with the patient in reverse Trendelenburg.  +---------+---------------+---------+-----------+----------+-------------------+ RIGHT    CompressibilityPhasicitySpontaneityPropertiesThrombus Aging      +---------+---------------+---------+-----------+----------+-------------------+ CFV                                                   Not visualized      +---------+---------------+---------+-----------+----------+-------------------+ SFJ                                                   Not visualized      +---------+---------------+---------+-----------+----------+-------------------+ FV Prox  None           No       No                   Acute               +---------+---------------+---------+-----------+----------+-------------------+ FV Mid   None           No       No                   Acute               +---------+---------------+---------+-----------+----------+-------------------+ FV DistalNone           No       No                   Acute               +---------+---------------+---------+-----------+----------+-------------------+ PFV      None           No       No                   Acute                +---------+---------------+---------+-----------+----------+-------------------+ POP      None           No       No  of infarction.   Left ventricular function is normal. Nuclear stress EF: 69%. The left ventricular ejection fraction is hyperdynamic (>65%). End diastolic cavity size is normal.   The study is normal. The study is low risk.     TODAY-DAY OF  DISCHARGE:  Subjective:   [employer] today has no headache,no chest abdominal pain,no new weakness tingling or numbness, feels much better wants to go home today.  Objective:   Blood pressure 118/78, pulse (!) 104, temperature 99 F (37.2 C), temperature source Oral, resp. rate 20, height 5\' 2"  (1.575 m), weight 73.7 kg, SpO2 95%. No intake or output data in the 24 hours ending 08/11/23 0952  Filed Weights   08/06/23 0500 08/07/23 0439 08/11/23 0400  Weight: 80.7 kg 78.7 kg 73.7 kg    Exam: Awake Alert, Oriented *3, No new F.N deficits, Normal affect [place].AT,PERRAL Supple Neck,No JVD, No cervical lymphadenopathy appriciated.  Symmetrical Chest wall movement, Good air movement bilaterally, CTAB RRR,No Gallops,Rubs or new Murmurs, No Parasternal Heave +ve B.Sounds, Abd Soft, Non tender, No organomegaly appriciated, No rebound -guarding or rigidity. No Cyanosis, Clubbing or edema, No new Rash or bruise   PERTINENT RADIOLOGIC STUDIES: ECHOCARDIOGRAM LIMITED  Result Date: 08/09/2023    ECHOCARDIOGRAM LIMITED REPORT   Patient Name:   Jocelyn [name] Date of Exam: 08/09/2023 Medical Rec #:  [id number]      Height:       62.0 in Accession #:    [id number]     Weight:       173.5 lb Date of Birth:  Aug 02, 1956     BSA:          1.800 m Patient Age:    66 years       BP:           131/71 mmHg Patient Gender: F              HR:           60 bpm. Exam Location:  Inpatient Procedure: Limited Echo, Limited Color Doppler, Intracardiac Opacification Agent            and Cardiac Doppler Indications:    I26.02 Pulmonary embolus  History:        Patient has prior history of Echocardiogram examinations, most                 recent 08/03/2023. CAD; Risk Factors:Diabetes, Hypertension and                 Dyslipidemia.  Sonographer:    Dondra Prader RVT RCS Referring Phys: [id number] DANIEL R BENSIMHON IMPRESSIONS  1. Left ventricular ejection fraction, by estimation, is 60 to 65%. The left ventricle has normal function.  The left ventricle has no regional wall motion abnormalities.  2. RV McConnell's sign is noted. RV size is best appreciated on contrast images. Right ventricular systolic function is mildly reduced. The right ventricular size is dilated.  3. The mitral valve is grossly normal. No evidence of mitral valve regurgitation. No evidence of mitral stenosis.  4. Limited study. Comparison(s): No significant change from prior study. Prior images reviewed side by side. FINDINGS  Left Ventricle: Left ventricular ejection fraction, by estimation, is 60 to 65%. The left ventricle has normal function. The left ventricle has no regional wall motion abnormalities. Definity contrast agent was given IV to delineate the left ventricular  endocardial borders. The left ventricular internal cavity size was normal in size. Right Ventricle: RV McConnell's sign is

## 2023-08-10 NOTE — Discharge Instructions (Addendum)
Information on my medicine - ELIQUIS (apixaban)  This medication education was reviewed with me or my healthcare representative as part of my discharge preparation.    Why was Eliquis prescribed for you? Eliquis was prescribed to treat blood clots that may have been found in the veins of your legs (deep vein thrombosis) or in your lungs (pulmonary embolism) and to reduce the risk of them occurring again.  What do You need to know about Eliquis ? The starting dose is 10 mg (two 5 mg tablets) taken TWICE daily for the FIRST SEVEN (7) DAYS, then on 08/17/2023  the dose is reduced to ONE 5 mg tablet taken TWICE daily.  Eliquis may be taken with or without food.   Try to take the dose about the same time in the morning and in the evening. If you have difficulty swallowing the tablet whole please discuss with your pharmacist how to take the medication safely.  Take Eliquis exactly as prescribed and DO NOT stop taking Eliquis without talking to the doctor who prescribed the medication.  Stopping may increase your risk of developing a new blood clot.  Refill your prescription before you run out.  After discharge, you should have regular check-up appointments with your healthcare provider that is prescribing your Eliquis.    What do you do if you miss a dose? If a dose of ELIQUIS is not taken at the scheduled time, take it as soon as possible on the same day and twice-daily administration should be resumed. The dose should not be doubled to make up for a missed dose.  Important Safety Information A possible side effect of Eliquis is bleeding. You should call your healthcare provider right away if you experience any of the following: Bleeding from an injury or your nose that does not stop. Unusual colored urine (red or dark brown) or unusual colored stools (red or black). Unusual bruising for unknown reasons. A serious fall or if you hit your head (even if there is no bleeding).  Some  medicines may interact with Eliquis and might increase your risk of bleeding or clotting while on Eliquis. To help avoid this, consult your healthcare provider or pharmacist prior to using any new prescription or non-prescription medications, including herbals, vitamins, non-steroidal anti-inflammatory drugs (NSAIDs) and supplements.  This website has more information on Eliquis (apixaban): http://www.eliquis.com/eliquis/home

## 2023-08-10 NOTE — Progress Notes (Signed)
ANTICOAGULATION CONSULT NOTE  Pharmacy Consult for Eliquis Indication: pulmonary embolus  Allergies  Allergen Reactions   Actos [Pioglitazone] Other (See Comments)    REACTION: pt states INTOL w/ edema   Codeine Other (See Comments)    REACTION: vomiting   Januvia [Sitagliptin] Nausea Only   Lactose Intolerance (Gi) Diarrhea   Morphine Nausea Only and Nausea And Vomiting    REACTION: vomiting REACTION: vomiting   Repatha [Evolocumab]     WAS NOT EFFECTIVE IN LOWERING LDL    Patient Measurements: Height: 5\' 2"  (157.5 cm) Weight: 78.7 kg (173 lb 8 oz) IBW/kg (Calculated) : 50.1 Heparin Dosing Weight: 64.5 kg  Vital Signs: Temp: 97.9 F (36.6 C) (09/25 0400) Temp Source: Oral (09/25 0400) BP: 145/75 (09/25 0400) Pulse Rate: 110 (09/25 0400)  Labs: Recent Labs    08/08/23 0345 08/08/23 1040 08/09/23 0838 08/10/23 0345 08/10/23 0346  HGB 7.4*  --  8.1*  --   --   HCT 22.8*  --  25.8*  --   --   PLT 135*  --  150  --   --   HEPARINUNFRC 0.52  --  0.44 0.37  --   CREATININE 0.99  --  0.98  --  1.13*  TROPONINIHS  --  40*  --   --   --     Estimated Creatinine Clearance: 46.3 mL/min (A) (by C-G formula based on SCr of 1.13 mg/dL (H)).  Assessment: 67 y.o. female with PE for Eliquis  Plan:  Eliquis 10 mg BID x 7 days, then Eliquis 5 mg BID  Geannie Risen, PharmD, BCPS

## 2023-08-10 NOTE — Progress Notes (Signed)
Removed CVC line without complications. Patient is going to be stay in the bed for 30 min. Patient & patient's daughter understood well. Educated patient's daughter how to  care of dressing every day. HS [employer]

## 2023-08-11 ENCOUNTER — Other Ambulatory Visit (HOSPITAL_COMMUNITY): Payer: Self-pay

## 2023-08-11 DIAGNOSIS — E0822 Diabetes mellitus due to underlying condition with diabetic chronic kidney disease: Secondary | ICD-10-CM | POA: Diagnosis not present

## 2023-08-11 DIAGNOSIS — I10 Essential (primary) hypertension: Secondary | ICD-10-CM | POA: Diagnosis not present

## 2023-08-11 DIAGNOSIS — I2699 Other pulmonary embolism without acute cor pulmonale: Secondary | ICD-10-CM | POA: Diagnosis not present

## 2023-08-11 LAB — GLUCOSE, CAPILLARY: Glucose-Capillary: 115 mg/dL — ABNORMAL HIGH (ref 70–99)

## 2023-08-11 MED ORDER — METOPROLOL TARTRATE 25 MG PO TABS
25.0000 mg | ORAL_TABLET | Freq: Two times a day (BID) | ORAL | Status: DC
  Administered 2023-08-11: 25 mg via ORAL
  Filled 2023-08-11: qty 1

## 2023-08-11 MED ORDER — APIXABAN 5 MG PO TABS
ORAL_TABLET | ORAL | 3 refills | Status: DC
  Filled 2023-08-11: qty 180, 83d supply, fill #0
  Filled 2023-10-20: qty 180, 83d supply, fill #1
  Filled 2024-01-30: qty 180, 90d supply, fill #2
  Filled 2024-04-18: qty 180, 90d supply, fill #0

## 2023-08-11 NOTE — TOC Transition Note (Signed)
Transition of Care [facility]) - CM/SW Discharge Note   Patient Details  Name: Jocelyn [name] MRN: [id number] Date of Birth: 1956/04/02  [facility] CM/SW Contact:  Nicanor Bake Phone Number: [phone] 08/11/2023, 3:30 PM   Clinical Narrative:  HF CSW scheduled pts hospital follow up appoint with PCP. Pt is being transported by family. HF CM ordered pts rollator. No identified barriers.        Barriers to Discharge: Continued Medical Work up   Patient Goals and CMS Choice      Discharge Placement                         Discharge Plan and Services Additional resources added to the After Visit Summary for                                       Social Determinants of Health (SDOH) Interventions SDOH Screenings   Food Insecurity: No Food Insecurity (08/05/2023)  Transportation Needs: No Transportation Needs (08/05/2023)  Utilities: Not At Risk (08/05/2023)  Alcohol Screen: Low Risk  (08/05/2023)  Depression (PHQ2-9): Low Risk  (09/03/2020)  Financial Resource Strain: Low Risk  (08/05/2023)  Stress: No Stress Concern Present (08/05/2023)  Tobacco Use: Low Risk  (08/03/2023)     Readmission Risk Interventions     No data to display

## 2023-08-11 NOTE — Plan of Care (Signed)

## 2023-08-11 NOTE — Progress Notes (Signed)
   IR Note  Known to IR PE thrombectomy 9/19  Pt is doing well and for DC to home today  Rt groin suture intact Removed without complication Dressing placed

## 2023-08-12 DIAGNOSIS — J811 Chronic pulmonary edema: Secondary | ICD-10-CM | POA: Diagnosis not present

## 2023-08-12 DIAGNOSIS — J9601 Acute respiratory failure with hypoxia: Secondary | ICD-10-CM | POA: Diagnosis not present

## 2023-08-12 DIAGNOSIS — I2699 Other pulmonary embolism without acute cor pulmonale: Secondary | ICD-10-CM | POA: Diagnosis not present

## 2023-08-12 NOTE — TOC CM/SW Note (Signed)
08/12/2023 854 am CM contacted [facility] for Rollator to be delivered to home. Isidoro Donning RN3 CCM, Heart Failure TOC CM [phone]

## 2023-08-15 LAB — FACTOR 5 LEIDEN

## 2023-08-16 LAB — PROTHROMBIN GENE MUTATION

## 2023-08-19 DIAGNOSIS — E669 Obesity, unspecified: Secondary | ICD-10-CM | POA: Diagnosis not present

## 2023-08-19 DIAGNOSIS — I2510 Atherosclerotic heart disease of native coronary artery without angina pectoris: Secondary | ICD-10-CM | POA: Diagnosis not present

## 2023-08-19 DIAGNOSIS — D3502 Benign neoplasm of left adrenal gland: Secondary | ICD-10-CM | POA: Diagnosis not present

## 2023-08-19 DIAGNOSIS — E1151 Type 2 diabetes mellitus with diabetic peripheral angiopathy without gangrene: Secondary | ICD-10-CM | POA: Diagnosis not present

## 2023-08-22 DIAGNOSIS — E782 Mixed hyperlipidemia: Secondary | ICD-10-CM | POA: Diagnosis not present

## 2023-08-22 DIAGNOSIS — F330 Major depressive disorder, recurrent, mild: Secondary | ICD-10-CM | POA: Diagnosis not present

## 2023-08-22 DIAGNOSIS — E785 Hyperlipidemia, unspecified: Secondary | ICD-10-CM | POA: Diagnosis not present

## 2023-08-22 DIAGNOSIS — I2692 Saddle embolus of pulmonary artery without acute cor pulmonale: Secondary | ICD-10-CM | POA: Diagnosis not present

## 2023-08-22 DIAGNOSIS — E1169 Type 2 diabetes mellitus with other specified complication: Secondary | ICD-10-CM | POA: Diagnosis not present

## 2023-08-22 DIAGNOSIS — I10 Essential (primary) hypertension: Secondary | ICD-10-CM | POA: Diagnosis not present

## 2023-08-22 DIAGNOSIS — R785 Finding of other psychotropic drug in blood: Secondary | ICD-10-CM | POA: Diagnosis not present

## 2023-08-22 DIAGNOSIS — I469 Cardiac arrest, cause unspecified: Secondary | ICD-10-CM | POA: Diagnosis not present

## 2023-08-22 DIAGNOSIS — G4700 Insomnia, unspecified: Secondary | ICD-10-CM | POA: Diagnosis not present

## 2023-08-22 NOTE — Progress Notes (Signed)
ADVANCED HF CLINIC CONSULT NOTE   Primary Care: Renaye Rakers, MD Primary Cardiologist: Lance Muss, MD HF Cardiologist: Dr. Shirlee Latch  HPI: 67 y.o. female w/ h/o inferior STEMI on 06/15/2017. She received a drug-eluting stent to her distal right coronary artery. She had mild LAD disease. Echo then showed normal LVEF 50-55%, RV normal.     Seen recently in cardiology clinic and complained of fatigue and knee pain. Ordered to get NST. This was done 9/12 and showed no ischemia.    Admitted 9/24 with PE. CT angio showed saddle pulmonary embolism as well as extensive bilateral emboli. She developed PEA arrest/ obstructive shock. Achieved ROSC after ~5 min of CPR, Epi x 1 and TNK. Intubated, started on NE. Initial POCUS showed LVEF ~30-35%, dilated RV w/ paradoxical septal motion and RV hypokinesis. Underwent mechanical/Aspiration thrombectomy of right and left main and lobar pulmonary arteries by IR. Formal echo showed improved LVEF, 60-65%, + RV McConnell's sign. RV dilated mildly reduced systolic fx. Required milrinone gtt. Drips eventually weaned, PRN lasix 20 started.  Ltd echo repeated showing EF 60-65%, RV mildly dilated with mild HK. Will require lifelong AC. She was discharged home, weight 173 lbs.  Today she returns for post hospital HF follow up with her daughter. Overall feeling fair, chest is sore from compressions. No SOB with ADLs or walking on flat ground, feels generally weak. Denies palpitations, abnormal bleeding, CP, dizziness, edema, or PND/Orthopnea. Appetite ok. No fever or chills. Weight at home 145 pounds. Taking all medications. Saw PCP yesterday, being treated for UTI. Lives alone, daughter stays with her to help her. Struggling with heartburn and nausea. 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  ECG (personally reviewed): ST 109 bpm  Labs (9/24): K 3.7, creatinine 1.13  Cardiac Studies - Ltd echo (08/09/23): EF 60-65%, RV mildly dilated with  mild HK.  - Echo (08/04/23): EF 60-65%, mild LVH, RVOT dilated and mildly reduced, + McConnell's sign.  - Nuclear stress trest (2/20): no ischemia, EF 64%  - Echo (8/18): EF 50-55%, RV normal  Review of Systems: [y] = yes, [ ]  = no   General: Weight gain [ ] ; Weight loss [ ] ; Anorexia [ ] ; Fatigue Cove.Etienne ]; Fever [ ] ; Chills [ ] ; Weakness Cove.Etienne ]  Cardiac: Chest pain/pressure [ ] ; Resting SOB [ ] ; Exertional SOB [ ] ; Orthopnea [ ] ; Pedal Edema [ ] ; Palpitations [ ] ; Syncope [ ] ; Presyncope [ ] ; Paroxysmal nocturnal dyspnea[ ]   Pulmonary: Cough [ ] ; Wheezing[ ] ; Hemoptysis[ ] ; Sputum [ ] ; Snoring [ ]   GI: Vomiting[ ] ; Dysphagia[ ] ; Melena[ ] ; Hematochezia [ ] ; Heartburn[ ] ; Abdominal pain [ ] ; Constipation [ ] ; Diarrhea [ ] ; BRBPR [ ]   GU: Hematuria[ ] ; Dysuria [ ] ; Nocturia[ ]   Vascular: Pain in legs with walking [ ] ; Pain in feet with lying flat [ ] ; Non-healing sores [ ] ; Stroke [ ] ; TIA [ ] ; Slurred speech [ ] ;  Neuro: Headaches[ ] ; Vertigo[ ] ; Seizures[ ] ; Paresthesias[ ] ;Blurred vision [ ] ; Diplopia [ ] ; Vision changes [ ]   Ortho/Skin: Arthritis [ y]; Joint pain [ ] ; Muscle pain [ ] ; Joint swelling [ ] ; Back Pain [ ] ; Rash [ ]   Psych: Depression[y ]; Anxiety[y]  Heme: Bleeding problems [ ] ; Clotting disorders [ ] ; Anemia Cove.Etienne ]  Endocrine: Diabetes Cove.Etienne ]; Thyroid dysfunction[ ]   Past Medical History:  Diagnosis Date   Acute bronchitis    Allergic rhinitis, cause unspecified  Anemia    Anxiety    B12 deficiency    BV (bacterial vaginosis) 06/22/1996   Calculus of kidney    Calculus of kidney    Coronary atherosclerosis of unspecified type of vessel, native or graft    Dizziness    Essential hypertension, benign    Fibroid 2003   Fibromyalgia    H/O dysmenorrhea 2008   H/O varicella    Headache(784.0)    frequently   HSV-2 infection 2009   Hyperplastic colon polyp 05/16/2014   Irritable bowel syndrome    Meniere's disease, unspecified    Menses, irregular 2003   Myalgia and  myositis, unspecified    Myocardial infarction (HCC)    Obstructive sleep apnea (adult) (pediatric)    Perimenopausal symptoms 2003   Pure hypercholesterolemia    Sarcoidosis    Type II or unspecified type diabetes mellitus without mention of complication, not stated as uncontrolled    Unspecified venous (peripheral) insufficiency    Vitamin D deficiency    Vulvitis 2010   Yeast infection    Current Outpatient Medications  Medication Sig Dispense Refill   allopurinol (ZYLOPRIM) 100 MG tablet Take 1 tablet by mouth daily as needed.     ALPRAZolam (XANAX) 0.5 MG tablet Take 0.5 mg by mouth 3 (three) times daily.     apixaban (ELIQUIS) 5 MG TABS tablet Take 10 mg by mouth twice daily, then on 08/17/2023-switch to 5 mg by mouth twice daily. 180 tablet 3   azaTHIOprine (IMURAN) 50 MG tablet Take 1 tablet by mouth daily.     Empagliflozin-metFORMIN HCl ER 25-1000 MG TB24 Take 1 tablet by mouth daily.     GEMTESA 75 MG TABS Take 1 tablet by mouth daily.     icosapent Ethyl (VASCEPA) 1 g capsule TAKE 2 CAPSULES(2 GRAMS) BY MOUTH TWICE DAILY 360 capsule 3   NEXLETOL 180 MG TABS TAKE 1 TABLET BY MOUTH DAILY 90 tablet 1   nitroGLYCERIN (NITROSTAT) 0.4 MG SL tablet Place 1 tablet (0.4 mg total) under the tongue every 5 (five) minutes as needed for chest pain. 25 tablet 2   ONETOUCH VERIO test strip CHECK BLOOD SUGAR TID AS DIRECTED     spironolactone (ALDACTONE) 25 MG tablet TAKE 1 TABLET BY MOUTH EVERY DAY 90 tablet 2   metoprolol tartrate (LOPRESSOR) 50 MG tablet TAKE 1 TABLET(50 MG) BY MOUTH TWICE DAILY (Patient not taking: Reported on 08/23/2023) 180 tablet 3   PRALUENT 150 MG/ML SOAJ INJECT 150 MG INTO THE SKIN EVERY 14 DAYS (Patient not taking: Reported on 08/23/2023) 6 mL 3   No current facility-administered medications for this encounter.   Allergies  Allergen Reactions   Actos [Pioglitazone] Other (See Comments)    REACTION: pt states INTOL w/ edema   Codeine Other (See Comments)     REACTION: vomiting   Januvia [Sitagliptin] Nausea Only   Lactose Intolerance (Gi) Diarrhea   Morphine Nausea Only and Nausea And Vomiting    REACTION: vomiting REACTION: vomiting   Repatha [Evolocumab]     WAS NOT EFFECTIVE IN LOWERING LDL   Social History   Socioeconomic History   Marital status: Single    Spouse name: Not on file   Number of children: 1   Years of education: Not on file   Highest education level: Not on file  Occupational History   Occupation: Retired Runner, broadcasting/film/video   Tobacco Use   Smoking status: Never   Smokeless tobacco: Never   Tobacco comments:    Daily  Caffeine - 1  Exercise 2-3 times/weekly  Vaping Use   Vaping status: Never Used  Substance and Sexual Activity   Alcohol use: No    Alcohol/week: 0.0 standard drinks of alcohol   Drug use: No   Sexual activity: Yes    Birth control/protection: None  Other Topics Concern   Not on file  Social History Narrative   Exercises 2-3 times weekly   Caffeine Use: 1 daily (tea or Pepsi)   Lives alone in a one story home.  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     Social Determinants of Health   Financial Resource Strain: Low Risk  (08/05/2023)   Overall Financial Resource Strain (CARDIA)    Difficulty of Paying Living Expenses: Not hard at all  Food Insecurity: No Food Insecurity (08/05/2023)   Hunger Vital Sign    Worried About Running Out of Food in the Last Year: Never true    Ran Out of Food in the Last Year: Never true  Transportation Needs: No Transportation Needs (08/05/2023)   PRAPARE - Administrator, Civil Service (Medical): No    Lack of Transportation (Non-Medical): No  Physical Activity: Not on file  Stress: No Stress Concern Present (08/05/2023)   [employer] of Occupational Health - Occupational Stress Questionnaire    Feeling of Stress : Not at all  Social Connections: Not on file  Intimate Partner Violence: Not At Risk (08/05/2023)   Humiliation, Afraid, Rape, and Kick  questionnaire    Fear of Current or Ex-Partner: No    Emotionally Abused: No    Physically Abused: No    Sexually Abused: No   Family History  Adopted: Yes  Problem Relation Age of Onset   Other Other        ADOPTED   Colon cancer Neg Hx    Esophageal cancer Neg Hx    Rectal cancer Neg Hx    Stomach cancer Neg Hx    Wt Readings from Last 3 Encounters:  08/23/23 66.4 kg (146 lb 6.4 oz)  08/11/23 73.7 kg (162 lb 7.7 oz)  08/02/23 68.9 kg (152 lb)   BP (!) 184/114   Pulse (!) 115   Wt 66.4 kg (146 lb 6.4 oz)   SpO2 98%   BMI 26.78 kg/m   PHYSICAL EXAM: General:  NAD. No resp difficulty, walked into clinic, fatigued-appearing HEENT: + hoarse Neck: Supple. No JVD. Carotids 2+ bilat; no bruits. No lymphadenopathy or thryomegaly appreciated. Cor: PMI nondisplaced. Regular tachy rate & rhythm. No rubs, gallops or murmurs. Lungs: Clear Abdomen: Soft, nontender, nondistended. No hepatosplenomegaly. No bruits or masses. Good bowel sounds. Extremities: No cyanosis, clubbing, rash, edema Neuro: Alert & oriented x 3, cranial nerves grossly intact. Moves all 4 extremities w/o difficulty. Flat affect  ASSESSMENT & PLAN: 1. PE: Large, hemodynamically significant saddle embolus. Had PEA arrest. Had thrombolytics then mechanical aspiration by IR. Trigger for PE uncertain, ambulation limited by knee pain but not sedentary.  No long trips, no known cancer, etc.  Does not know family history (adopted).  - Continue Eliquis, indefinitely. No bleeding issues. Will request labs drawn at PCP yesterday. - Hypercoagulable work up negative--> Factor V Leiden, prothrombin gene mutation, and lupus anticoagulant panel. - Refer to Hematology.  - Has Pulmonology follow up soon. 2. Chronic RV failure: Recent shock from large saddle embolus.  S/p thrombectomy and started on pressors/inotropes for RV shock. NYHA IIb, functional status confounded by physical deconditioning. She is not volume overloaded  today. - She has PRN lasix 20 mg at home. - Continue spironolactone 25 mg daily - Repeat echo next visit to ensure RV stable. 3. Recent AKI: Suspect rise due to shock. Requesting labs from recent PCP visit. 4. Elevated LFTs: Suspect shock liver.  Follow.  5. CAD: H/o inferior MI in 8/18 with DES to RCA.  Cardiolite in 9/24 showed no ischemia/infarction. No ischemic chest pain. - Continue Praluent. - Off Plavix now with need for DOAC.  6. HTN: BP elevated today, has not had her beta blocker. - Continue spiro - Stop metoprolol succinate, start Toprol XL 100 mg daily to lessen pill burden. 7. Medication management: Lives alone, daughter helps out.  - Arrange paramedicine for medication management. 8. Physical deconditioning: she carries a diagnosis of polymyositis. Arrange PT, she has Pulm follow up next month. [facility]. 9. GERD: start pantoprazole 40 mg daily.  - PCP will need to manage going forward.   Follow up in 2-3 months with Dr. Shirlee Latch + echo.  Prince Rome, FNP-BC 08/23/23  Greater than 50% of the (total minutes 40) visit spent in counseling/coordination of care regarding ( recent hospitalization, medication changes, chronic disease management)

## 2023-08-23 ENCOUNTER — Encounter (HOSPITAL_COMMUNITY): Payer: Self-pay

## 2023-08-23 ENCOUNTER — Ambulatory Visit (HOSPITAL_COMMUNITY)
Admit: 2023-08-23 | Discharge: 2023-08-23 | Disposition: A | Discharge status: Home / Self Care | Payer: Medicare PPO | Attending: Family Medicine

## 2023-08-23 ENCOUNTER — Other Ambulatory Visit (HOSPITAL_COMMUNITY): Payer: Self-pay

## 2023-08-23 ENCOUNTER — Telehealth (HOSPITAL_COMMUNITY): Payer: Self-pay | Admitting: Licensed Clinical Social Worker

## 2023-08-23 VITALS — BP 184/114 | HR 115 | Wt 146.4 lb

## 2023-08-23 DIAGNOSIS — M25569 Pain in unspecified knee: Secondary | ICD-10-CM | POA: Diagnosis not present

## 2023-08-23 DIAGNOSIS — I252 Old myocardial infarction: Secondary | ICD-10-CM | POA: Insufficient documentation

## 2023-08-23 DIAGNOSIS — K219 Gastro-esophageal reflux disease without esophagitis: Secondary | ICD-10-CM

## 2023-08-23 DIAGNOSIS — I2699 Other pulmonary embolism without acute cor pulmonale: Secondary | ICD-10-CM | POA: Diagnosis not present

## 2023-08-23 DIAGNOSIS — I469 Cardiac arrest, cause unspecified: Secondary | ICD-10-CM | POA: Diagnosis not present

## 2023-08-23 DIAGNOSIS — I10 Essential (primary) hypertension: Secondary | ICD-10-CM | POA: Diagnosis not present

## 2023-08-23 DIAGNOSIS — N179 Acute kidney failure, unspecified: Secondary | ICD-10-CM | POA: Insufficient documentation

## 2023-08-23 DIAGNOSIS — Z79899 Other long term (current) drug therapy: Secondary | ICD-10-CM

## 2023-08-23 DIAGNOSIS — Z955 Presence of coronary angioplasty implant and graft: Secondary | ICD-10-CM | POA: Diagnosis not present

## 2023-08-23 DIAGNOSIS — R7989 Other specified abnormal findings of blood chemistry: Secondary | ICD-10-CM | POA: Diagnosis not present

## 2023-08-23 DIAGNOSIS — I2510 Atherosclerotic heart disease of native coronary artery without angina pectoris: Secondary | ICD-10-CM | POA: Diagnosis not present

## 2023-08-23 DIAGNOSIS — R5381 Other malaise: Secondary | ICD-10-CM | POA: Insufficient documentation

## 2023-08-23 DIAGNOSIS — I50810 Right heart failure, unspecified: Secondary | ICD-10-CM | POA: Diagnosis not present

## 2023-08-23 MED ORDER — PANTOPRAZOLE SODIUM 40 MG PO TBEC: 40.0000 mg | DELAYED_RELEASE_TABLET | Freq: Every day | ORAL | 0 refills | Status: AC

## 2023-08-23 MED ORDER — METOPROLOL SUCCINATE ER 100 MG PO TB24
100.0000 mg | ORAL_TABLET | Freq: Every day | ORAL | 3 refills | Status: DC
Start: 2023-08-23 — End: 2024-08-15

## 2023-08-23 NOTE — Telephone Encounter (Signed)
Paramedicine Initial Assessment:  Housing:  In what kind of housing do you live? House/apt/trailer/shelter?  Do you rent/pay a mortgage/own?  Do you live with anyone?  Are you currently worried about losing your housing?  Within the past 12 months have you ever stayed outside, in a car, tent, a shelter, or temporarily with someone?  Within the past 12 months have you been unable to get utilities when it was really needed?  Social:  What is your current marital status?  Do you have any children?  Do you have family or friends who live locally?  Food:  Within the past 12 months were you ever worried that food would run out before you got money to buy more?  Within the past 12months have you run out of food and didn't have money to buy more?  Income:  What is your current source of income?   How hard is it for you to pay for the basics like food housing, medical care, and utilities?  Do you have outstanding medical bills?  Insurance:  Are you currently insured?   Do you have prescription coverage?  If no insurance, have you applied for coverage (Medicaid, disability, marketplace etc)?  Transportation:  Do you have transportation to your medical appointments?  In the past 12 months has lack of transportation kept you from medical appts or from getting medications?  In the past 12 months has lack of transportation kept you from meetings, work, or getting things you needed?   Daily Health Needs: Do you have a working scale at home?  How do you manage your medications at home?  Do you ever take your medications differently than prescribed?  Do you have issues affording your medications?  If yes, has this ever prevented you from obtaining medications?  Do you have any concerns with mobility at home?  Do you use any assistive devices at home or have PCS at home?  Do you have a PCP?  Do you have any trouble reading or writing?  Do you currently use tobacco  products or have recently quit?  Do you currently see any mental health providers?  Are there any additional barriers you see to getting the care you need?  CSW will continue to follow through paramedicine program and assist as needed.

## 2023-08-23 NOTE — Patient Instructions (Addendum)
Thank you for coming in today  If you had labs drawn today, any labs that are abnormal the clinic will call you No news is good news   You have been referred to  Social Work for para medicine with medication management , you will be called you for further details   You have been referred to  physical therapy , their office will call you for further appointment details   You have been referred to  hematology, their office will call you for further appointment details   Medications: STOP Metoprolol Tartrate START Topol XL 100 mg 1 tablet daily  Protonix 40 mg 1 tablet daily   Follow up appointments:  Your physician recommends that you schedule a follow-up appointment in:  2-3 months With Dr. Shirlee Latch with echocardiogram  You will receive a reminder letter in the mail a few months in advance. If you don't receive a letter, please call our office to schedule the follow-up appointment.  Your physician has requested that you have an echocardiogram. Echocardiography is a painless test that uses sound waves to create images of your heart. It provides your doctor with information about the size and shape of your heart and how well your heart's chambers and valves are working. This procedure takes approximately one hour. There are no restrictions for this procedure.      Do the following things EVERYDAY: Weigh yourself in the morning before breakfast. Write it down and keep it in a log. Take your medicines as prescribed Eat low salt foods--Limit salt (sodium) to 2000 mg per day.  Stay as active as you can everyday Limit all fluids for the day to less than 2 liters   At the [facility], you and your health needs are our priority. As part of our continuing mission to provide you with exceptional heart care, we have created designated Provider Care Teams. These Care Teams include your primary Cardiologist (physician) and Advanced Practice Providers (APPs- Physician Assistants  and Nurse Practitioners) who all work together to provide you with the care you need, when you need it.   You may see any of the following providers on your designated Care Team at your next follow up: Dr Arvilla Meres Dr Marca Ancona Dr. Marcos Eke, NP Robbie Lis, [place] [facility] [place], [place] Brynda Peon, NP Karle Plumber, PharmD   Please be sure to bring in all your medications bottles to every appointment.    Thank you for choosing [facility]  If you have any questions or concerns before your next appointment please send [name] a message through [place] or call our office at [phone].    TO LEAVE A MESSAGE FOR THE NURSE SELECT OPTION 2, PLEASE LEAVE A MESSAGE INCLUDING: YOUR NAME DATE OF BIRTH CALL BACK NUMBER REASON FOR CALL**this is important as we prioritize the call backs  YOU WILL RECEIVE A CALL BACK THE SAME DAY AS LONG AS YOU CALL BEFORE 4:00 PM

## 2023-08-23 NOTE — Progress Notes (Signed)
Heart and Vascular Care Navigation  08/23/2023  Jocelyn [name] 12-05-1955 [id number]  Reason for Referral: paramedicine enrollment   Engaged with patient by telephone for initial visit for Heart and Vascular Care Coordination.                                                                                                   Housing:  In what kind of housing do you live? House/apt/trailer/shelter? House- normally live by herself but has been living with her daughter post hospital [place] ([address] [place], [place], [place])  Do you live with anyone? no  Are you currently worried about losing your housing? no  Social:   Only support person reported is the dtr [place].  Income:  What is your current source of income? Retirement income  Insurance:  Are you currently insured? [employer] Medicare  Do you have prescription coverage? yes  Transportation:  Do you have transportation to your medical appointments? Dtr has been taking her post hospital stay but she has a car and normally drives herself.   Daily Health Needs: Do you have a working scale at home? yes  How do you manage your medications at home? Manages them herself by taking out of the bottle  Do you ever take your medications differently than prescribed? no  Do you have issues affording your medications? no  Do you have any concerns with mobility at home? Somewhat weak recently but not normally- home health PT has been ordered but not started yet.  Do you have a PCP? Dr. Parke Simmers  Do you currently use tobacco products or have recently quit? no  Do you currently see any mental health providers? no  Are there any additional barriers you see to getting the care you need? no  CSW will continue to follow through paramedicine program and assist as needed.                                   HRT/VAS Care Coordination     Patients Home Cardiology Office Heart Failure Clinic   Outpatient Care Team Community Paramedicine; Social  Worker   Social Worker Name: Naaman Plummer, [phone]   Living arrangements for the past 2 months Single Family Home   Lives with: Self   Patient Current Insurance Coverage Managed Medicare   Patient Has Concern With Paying Medical Bills No   Does Patient Have Prescription Coverage? Yes   Home Assistive Devices/Equipment None       Social History:                                                                             SDOH Screenings   Food Insecurity: No Food Insecurity (08/05/2023)  Transportation Needs: No  Transportation Needs (08/05/2023)  Utilities: Not At Risk (08/05/2023)  Alcohol Screen: Low Risk  (08/05/2023)  Depression (PHQ2-9): Low Risk  (09/03/2020)  Financial Resource Strain: Low Risk  (08/05/2023)  Stress: No Stress Concern Present (08/05/2023)  Tobacco Use: Low Risk  (08/23/2023)    SDOH Interventions: Financial Resources:    Retirement income  Food Insecurity:  None reported  Housing Insecurity:  Housing Interventions: Intervention Not Indicated  Transportation:   Patient drives or dtr does when needed    Follow-up plan:    CSW sending referral to paramedics for assignment and follow up   Burna Sis, LCSW Clinical Social Worker [facility] Desk#: [phone] Cell#: [phone]

## 2023-08-24 ENCOUNTER — Telehealth (HOSPITAL_COMMUNITY): Payer: Self-pay

## 2023-08-24 ENCOUNTER — Other Ambulatory Visit: Payer: Self-pay

## 2023-08-24 ENCOUNTER — Ambulatory Visit: Payer: Medicare PPO | Attending: Family Medicine

## 2023-08-24 DIAGNOSIS — R6889 Other general symptoms and signs: Secondary | ICD-10-CM | POA: Insufficient documentation

## 2023-08-24 DIAGNOSIS — R262 Difficulty in walking, not elsewhere classified: Secondary | ICD-10-CM | POA: Insufficient documentation

## 2023-08-24 DIAGNOSIS — M2681 Anterior soft tissue impingement: Secondary | ICD-10-CM | POA: Diagnosis not present

## 2023-08-24 DIAGNOSIS — M6281 Muscle weakness (generalized): Secondary | ICD-10-CM

## 2023-08-24 NOTE — Therapy (Unsigned)
OUTPATIENT PHYSICAL THERAPY LOWER EXTREMITY EVALUATION   Patient Name: Jocelyn [name] MRN: [id number] DOB:Oct 11, 1956, 67 y.o., female Today's Date: 08/24/2023  END OF SESSION:  PT End of Session - 08/24/23 1453     Visit Number 1    Date for PT Re-Evaluation 10/19/23    Authorization Type [employer]    PT Start Time 1445    Activity Tolerance Patient limited by fatigue    Behavior During Therapy Flat affect             Past Medical History:  Diagnosis Date   Acute bronchitis    Allergic rhinitis, cause unspecified    Anemia    Anxiety    B12 deficiency    BV (bacterial vaginosis) 06/22/1996   Calculus of kidney    Calculus of kidney    Coronary atherosclerosis of unspecified type of vessel, native or graft    Dizziness    Essential hypertension, benign    Fibroid 2003   Fibromyalgia    H/O dysmenorrhea 2008   H/O varicella    Headache(784.0)    frequently   HSV-2 infection 2009   Hyperplastic colon polyp 05/16/2014   Irritable bowel syndrome    Meniere's disease, unspecified    Menses, irregular 2003   Myalgia and myositis, unspecified    Myocardial infarction (HCC)    Obstructive sleep apnea (adult) (pediatric)    Perimenopausal symptoms 2003   Pure hypercholesterolemia    Sarcoidosis    Type II or unspecified type diabetes mellitus without mention of complication, not stated as uncontrolled    Unspecified venous (peripheral) insufficiency    Vitamin D deficiency    Vulvitis 2010   Yeast infection    Past Surgical History:  Procedure Laterality Date   COLONOSCOPY     CORONARY STENT INTERVENTION N/A 06/15/2017   Procedure: CORONARY STENT INTERVENTION;  Surgeon: Corky Crafts, MD;  Location: [facility];  Service: Cardiovascular;  Laterality: N/A;   heart catherization     IR ANGIOGRAM PULMONARY BILATERAL SELECTIVE  08/04/2023   IR ANGIOGRAM SELECTIVE EACH ADDITIONAL VESSEL  08/04/2023   IR ANGIOGRAM SELECTIVE EACH ADDITIONAL VESSEL   08/04/2023   IR THROMBECT PRIM MECH INIT (INCLU) MOD SED  08/04/2023   IR US GUIDE VASC ACCESS RIGHT  08/04/2023   LEFT HEART CATH AND CORONARY ANGIOGRAPHY N/A 06/15/2017   Procedure: LEFT HEART CATH AND CORONARY ANGIOGRAPHY;  Surgeon: Corky Crafts, MD;  Location: [facility];  Service: Cardiovascular;  Laterality: N/A;   RADIOLOGY WITH ANESTHESIA N/A 08/04/2023   Procedure: IR WITH ANESTHESIA;  Surgeon: Julieanne Cotton, MD;  Location: [facility];  Service: Radiology;  Laterality: N/A;   TONSILLECTOMY     Patient Active Problem List   Diagnosis Date Noted   Physical deconditioning 08/23/2023   Acute respiratory failure with hypoxia (HCC) 08/05/2023   Pulmonary embolism (HCC) 08/03/2023   Pulmonary edema 08/03/2023   Degenerative arthritis of knee 08/20/2021   Hx of pleural effusion    Dyspnea 08/16/2018   Fall on or from sidewalk curb, initial encounter 05/31/2018   History of acute inferior wall MI 08/22/2017   Diabetes mellitus (HCC)    S/P right coronary artery (RCA) stent placement    Overweight 05/05/2017   Diabetes mellitus type 2, controlled, without complications (HCC) 12/16/2016   Asthmatic bronchitis , chronic (HCC) 05/10/2016   Polymyositis (HCC) 09/15/2015   History of sarcoidosis 09/15/2015   Falling 03/13/2015   Edema 02/13/2014   Coronary  artery disease involving native heart without angina pectoris 10/29/2013   Hyperlipidemia 10/29/2013   Iron (Fe) deficiency anemia 08/08/2013   Vitamin B 12 deficiency 08/08/2013   GERD (gastroesophageal reflux disease) 09/08/2011   Weakness 02/22/2011   Depression 02/22/2011   MENIERE'S DISEASE 04/30/2009   Vitamin D deficiency 02/05/2009   Venous (peripheral) insufficiency 12/12/2007   RENAL CALCULUS 12/12/2007   DIZZINESS 12/12/2007   HYPERCHOLESTEROLEMIA 09/16/2007   Anxiety state 09/16/2007   HYPERTENSION, BENIGN 09/16/2007   Allergic rhinitis 09/16/2007   IRRITABLE BOWEL SYNDROME 09/16/2007   Myalgia and  myositis 09/16/2007    PCP: ***  REFERRING PROVIDER: ***  REFERRING DIAG: ***  THERAPY DIAG:  Difficulty in walking, not elsewhere classified  Muscle weakness (generalized)  Decreased activity tolerance  Rationale for Evaluation and Treatment: {HABREHAB:27488}  ONSET DATE: ***  SUBJECTIVE:   SUBJECTIVE STATEMENT: ***  PERTINENT HISTORY: *** PAIN:  Are you having pain? No  PRECAUTIONS: Other: RECENT PE, MONITOR O2 AND HR!  RED FLAGS: {PT Red Flags:29287}   WEIGHT BEARING RESTRICTIONS: {Yes ***/No:24003}  FALLS:  Has patient fallen in last 6 months? No  LIVING ENVIRONMENT: Lives with: {[facility] lives with:25569::"lives with their family"} Lives in: {Lives in:25570} Stairs: {opstairs:27293} Has following equipment at home: {Assistive devices:23999}  OCCUPATION: ***  PLOF: {PLOF:24004}  PATIENT GOALS: To be able to get back to things I enjoy doing: working part time, sorority events, doing everything independently, driving  NEXT MD VISIT: ***  OBJECTIVE:  Note: Objective measures were completed at Evaluation unless otherwise noted.  DIAGNOSTIC FINDINGS: ***  PATIENT SURVEYS:  {rehab surveys:24030}  COGNITION: Overall cognitive status: {cognition:24006}     SENSATION: {sensation:27233}  EDEMA:  {edema:24020}  MUSCLE LENGTH: Hamstrings: Right *** deg; Left *** deg Maisie Fus test: Right *** deg; Left *** deg  POSTURE: {posture:25561}  PALPATION: ***  LOWER EXTREMITY ROM:  {AROM/PROM:27142} ROM Right eval Left eval  Hip flexion    Hip extension    Hip abduction    Hip adduction    Hip internal rotation    Hip external rotation    Knee flexion    Knee extension    Ankle dorsiflexion    Ankle plantarflexion    Ankle inversion    Ankle eversion     (Blank rows = not tested)  LOWER EXTREMITY MMT:  MMT Right eval Left eval  Hip flexion    Hip extension    Hip abduction    Hip adduction    Hip internal rotation    Hip external  rotation    Knee flexion    Knee extension    Ankle dorsiflexion    Ankle plantarflexion    Ankle inversion    Ankle eversion     (Blank rows = not tested)   FUNCTIONAL TESTS: Target HR 122,  Max HR 152 5 times sit to stand: 33.91 With use of hands with O2 sat at no lower than 95% and HR at max 114 Timed up and go (TUG): 19.62 with O2 drop to no lower than 97% and HR max at 111   GAIT: Distance walked: *** Assistive device utilized: {Assistive devices:23999} Level of assistance: {Levels of assistance:24026} Comments: ***   TODAY'S TREATMENT:  DATE: 08/24/23 Initial eval completed and initiated HEP    PATIENT EDUCATION:  Education details: *** Person educated: {Person educated:25204} Education method: {Education Method:25205} Education comprehension: {Education Comprehension:25206}  HOME EXERCISE PROGRAM: Access Code: [email or web address] URL: [email or web address] Date: 08/24/2023 Prepared by: Mikey Kirschner  Exercises - Seated Overhead Press  - 1 x daily - 7 x weekly - 2 sets - 10 reps - Seated Bicep Curls Supinated with Dumbbells  - 1 x daily - 7 x weekly - 2 sets - 10 reps - Seated Long Arc Quad  - 1 x daily - 7 x weekly - 2 sets - 10 reps - Seated March  - 1 x daily - 7 x weekly - 1 sets - 20 reps - Sit to Stand with Armchair  - 1 x daily - 7 x weekly - 2 sets - 5 reps  ASSESSMENT:  CLINICAL IMPRESSION: Patient is a *** y.o. *** who was seen today for physical therapy evaluation and treatment for ***.   OBJECTIVE IMPAIRMENTS: {opptimpairments:25111}.   ACTIVITY LIMITATIONS: {activitylimitations:27494}  PARTICIPATION LIMITATIONS: {participationrestrictions:25113}  PERSONAL FACTORS: {Personal factors:25162} are also affecting patient's functional outcome.   REHAB POTENTIAL: {rehabpotential:25112}  CLINICAL DECISION MAKING:  {clinical decision making:25114}  EVALUATION COMPLEXITY: {Evaluation complexity:25115}   GOALS: Goals reviewed with patient? {yes/no:20286}  SHORT TERM GOALS: Target date: *** *** Baseline: Goal status: INITIAL  2.  *** Baseline:  Goal status: INITIAL  3.  *** Baseline:  Goal status: INITIAL  4.  *** Baseline:  Goal status: INITIAL  5.  *** Baseline:  Goal status: INITIAL  6.  *** Baseline:  Goal status: INITIAL  LONG TERM GOALS: Target date: ***  *** Baseline:  Goal status: INITIAL  2.  *** Baseline:  Goal status: INITIAL  3.  *** Baseline:  Goal status: INITIAL  4.  *** Baseline:  Goal status: INITIAL  5.  *** Baseline:  Goal status: INITIAL  6.  *** Baseline:  Goal status: INITIAL   PLAN:  PT FREQUENCY: {rehab frequency:25116}  PT DURATION: {rehab duration:25117}  PLANNED INTERVENTIONS: {rehab planned interventions:25118::"Therapeutic exercises","Therapeutic activity","Neuromuscular re-education","Balance training","Gait training","Patient/Family education","Self Care","Joint mobilization"}  PLAN FOR NEXT SESSION: Vernell Barrier, PT 08/24/2023, 2:56 PM

## 2023-08-24 NOTE — Telephone Encounter (Signed)
Pts daughter, [name], called me back today.  She is agreeable to home visits,  however this week she has appoint today and tomor and since I dont work on Fridays, she can be seen on Monday.   So I have her down for Monday around 1.  Daughter denied any needs at the moment.   Kerry Hough, EMT-Paramedic  [phone] 08/24/2023

## 2023-08-24 NOTE — Telephone Encounter (Signed)
Reached out to pt regarding home visit for new referral to paramedicine.  Called on cell phone and it just rang and then cut off-so unable to LVM on that one and then the home number listed no answer and mailbox was full.    Kerry Hough, EMT-Paramedic  [phone] 08/24/2023

## 2023-08-28 ENCOUNTER — Other Ambulatory Visit: Payer: Self-pay | Admitting: Interventional Cardiology

## 2023-08-28 DIAGNOSIS — E782 Mixed hyperlipidemia: Secondary | ICD-10-CM

## 2023-08-28 DIAGNOSIS — I2510 Atherosclerotic heart disease of native coronary artery without angina pectoris: Secondary | ICD-10-CM

## 2023-08-28 DIAGNOSIS — Z955 Presence of coronary angioplasty implant and graft: Secondary | ICD-10-CM

## 2023-08-29 ENCOUNTER — Other Ambulatory Visit (HOSPITAL_COMMUNITY): Payer: Self-pay

## 2023-08-29 NOTE — Progress Notes (Signed)
Paramedicine Encounter    Patient ID: Jocelyn [name], female    DOB: 12-Sep-1956, 67 y.o.   MRN: [id number]   Complaints-sore from compressions (cpr)   Edema-none   Compliance with meds-yes  Pill box filled-n/a  If so, by whom-doesn't use   Refills needed-none      SOCIAL/MEDICAL BARRIERS:  PHARMACY USED -[facility] on [address]    MED ISSUES:  AFFORDABILITY-will f/u once the eliquis needs to be refilled  PT ASSIST APPS NEEDED-TBD  PCP-yes    INSURANCE-yes  SOURCE OF INCOME-retirement   TRANSPORTATION-right now daughter-she drove before and would like to get independent like she was before  FOOD INSECURITIES/NEEDS-none  FOOD STAMPS-na   REVIEWED DIET/FLUID/SALT RESTRICTIONS-yes   RENT/OWN HOME ISSUES-none   SOCIAL SUPPORT-daughter very involved   SAFETY/DOMESTIC ISSUES-none   SUBSTANCE ABUSE-none   DAILY WEIGHTS-does not have scales here  EDUCATE ON DISEASE PROCESS/SYMPTOMS/PURPOSES OF MEDS-yes   Pt is new referral.  Pt is staying with daughter since her d/c. Pt was hosp for PE and then suffered cardiac arrest.  She reports being very sore from the compressions and throat irritation from the intubation. No fx ribs though.  Since being home she is feeling ok. Trying to get her energy back. Appetite not so good at the moment either. Just started on med to help with that.  She is going to outpt PT.  Pt does have nausea and has vomit occasionally as well. She does have acid reflux and then in turn causes the nausea.  She was prescribed pantoprazole at clinic last week, it has helped a small amount but not too much.  The nausea does prevent her from eating.   Meds reviewed-she has not taken the praulent in some time due to it being at her other house.   She reports increased sob if doing too much.   Still struggling with UTI- dr bland put her on macrobid-advised her to go back for f/u since she is nearing the end of her rx and she is still having quite  discomfort.   Reviewed pts meds-she takes them from the bottle.  Right now daughter is getting her to appointments but the goal is to get her back home independent when she can. Prior to this last admission she was living alone, independent, working part time, driving self and doing well and she wants to get back to that.  Her b/p last week in clinic was very high, she had not taken her meds yet at that time. Her metoprolol was changed from tartrate to XL. Family will try to keep her b/p monitored at home.  BP 108/64   Pulse 86   Resp 16   SpO2 98%  Weight yesterday-? Last visit weight-146  Patient Care Team: Renaye Rakers, MD as PCP - General (Family Medicine) Corky Crafts, MD as PCP - Cardiology (Cardiology)  Patient Active Problem List   Diagnosis Date Noted   Physical deconditioning 08/23/2023   Acute respiratory failure with hypoxia (HCC) 08/05/2023   Pulmonary embolism (HCC) 08/03/2023   Pulmonary edema 08/03/2023   Degenerative arthritis of knee 08/20/2021   Hx of pleural effusion    Dyspnea 08/16/2018   Fall on or from sidewalk curb, initial encounter 05/31/2018   History of acute inferior wall MI 08/22/2017   Diabetes mellitus (HCC)    S/P right coronary artery (RCA) stent placement    Overweight 05/05/2017   Diabetes mellitus type 2, controlled, without complications (HCC) 12/16/2016   Asthmatic bronchitis , chronic (  HCC) 05/10/2016   Polymyositis (HCC) 09/15/2015   History of sarcoidosis 09/15/2015   Falling 03/13/2015   Edema 02/13/2014   Coronary artery disease involving native heart without angina pectoris 10/29/2013   Hyperlipidemia 10/29/2013   Iron (Fe) deficiency anemia 08/08/2013   Vitamin B 12 deficiency 08/08/2013   GERD (gastroesophageal reflux disease) 09/08/2011   Weakness 02/22/2011   Depression 02/22/2011   MENIERE'S DISEASE 04/30/2009   Vitamin D deficiency 02/05/2009   Venous (peripheral) insufficiency 12/12/2007   RENAL CALCULUS  12/12/2007   DIZZINESS 12/12/2007   HYPERCHOLESTEROLEMIA 09/16/2007   Anxiety state 09/16/2007   HYPERTENSION, BENIGN 09/16/2007   Allergic rhinitis 09/16/2007   IRRITABLE BOWEL SYNDROME 09/16/2007   Myalgia and myositis 09/16/2007    Current Outpatient Medications:    ALPRAZolam (XANAX) 0.5 MG tablet, Take 0.5 mg by mouth 3 (three) times daily., Disp: , Rfl:    apixaban (ELIQUIS) 5 MG TABS tablet, Take 10 mg by mouth twice daily, then on 08/17/2023-switch to 5 mg by mouth twice daily., Disp: 180 tablet, Rfl: 3   azaTHIOprine (IMURAN) 50 MG tablet, Take 1 tablet by mouth daily., Disp: , Rfl:    Empagliflozin-metFORMIN HCl ER 25-1000 MG TB24, Take 1 tablet by mouth daily., Disp: , Rfl:    GEMTESA 75 MG TABS, Take 1 tablet by mouth daily., Disp: , Rfl:    icosapent Ethyl (VASCEPA) 1 g capsule, TAKE 2 CAPSULES(2 GRAMS) BY MOUTH TWICE DAILY, Disp: 360 capsule, Rfl: 3   metoprolol succinate (TOPROL-XL) 100 MG 24 hr tablet, Take 1 tablet (100 mg total) by mouth daily. Take with or immediately following a meal., Disp: 90 tablet, Rfl: 3   NEXLETOL 180 MG TABS, TAKE 1 TABLET BY MOUTH DAILY, Disp: 90 tablet, Rfl: 1   pantoprazole (PROTONIX) 40 MG tablet, Take 1 tablet (40 mg total) by mouth daily., Disp: 30 tablet, Rfl: 0   PRALUENT 150 MG/ML SOAJ, ADMINISTER 1 ML UNDER THE SKIN EVERY 14 DAYS, Disp: 6 mL, Rfl: 3   spironolactone (ALDACTONE) 25 MG tablet, TAKE 1 TABLET BY MOUTH EVERY DAY, Disp: 90 tablet, Rfl: 2   allopurinol (ZYLOPRIM) 100 MG tablet, Take 1 tablet by mouth daily as needed. (Patient not taking: Reported on 08/29/2023), Disp: , Rfl:    nitroGLYCERIN (NITROSTAT) 0.4 MG SL tablet, Place 1 tablet (0.4 mg total) under the tongue every 5 (five) minutes as needed for chest pain., Disp: 25 tablet, Rfl: 2   ONETOUCH VERIO test strip, CHECK BLOOD SUGAR TID AS DIRECTED, Disp: , Rfl:  Allergies  Allergen Reactions   Actos [Pioglitazone] Other (See Comments)    REACTION: pt states INTOL w/ edema    Codeine Other (See Comments)    REACTION: vomiting   Januvia [Sitagliptin] Nausea Only   Lactose Intolerance (Gi) Diarrhea   Morphine Nausea Only and Nausea And Vomiting    REACTION: vomiting REACTION: vomiting   Repatha [Evolocumab]     WAS NOT EFFECTIVE IN LOWERING LDL      Social History   Socioeconomic History   Marital status: Single    Spouse name: Not on file   Number of children: 1   Years of education: Not on file   Highest education level: Not on file  Occupational History   Occupation: Retired Runner, broadcasting/film/video   Tobacco Use   Smoking status: Never   Smokeless tobacco: Never   Tobacco comments:    Daily Caffeine - 1  Exercise 2-3 times/weekly  Vaping Use   Vaping status: Never Used  Substance and Sexual Activity   Alcohol use: No    Alcohol/week: 0.0 standard drinks of alcohol   Drug use: No   Sexual activity: Yes    Birth control/protection: None  Other Topics Concern   Not on file  Social History Narrative   Exercises 2-3 times weekly   Caffeine Use: 1 daily (tea or Pepsi)   Lives alone in a one story home.  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     Social Determinants of Health   Financial Resource Strain: Low Risk  (08/05/2023)   Overall Financial Resource Strain (CARDIA)    Difficulty of Paying Living Expenses: Not hard at all  Food Insecurity: No Food Insecurity (08/05/2023)   Hunger Vital Sign    Worried About Running Out of Food in the Last Year: Never true    Ran Out of Food in the Last Year: Never true  Transportation Needs: No Transportation Needs (08/05/2023)   PRAPARE - Administrator, Civil Service (Medical): No    Lack of Transportation (Non-Medical): No  Physical Activity: Not on file  Stress: No Stress Concern Present (08/05/2023)   [employer] of Occupational Health - Occupational Stress Questionnaire    Feeling of Stress : Not at all  Social Connections: Not on file  Intimate Partner Violence: Not At Risk (08/05/2023)    Humiliation, Afraid, Rape, and Kick questionnaire    Fear of Current or Ex-Partner: No    Emotionally Abused: No    Physically Abused: No    Sexually Abused: No    Physical Exam      Future Appointments  Date Time Provider Department Center  08/31/2023  2:30 PM Judi Saa, DO [facility] None  09/15/2023  2:45 PM Mikey Kirschner B, PT [facility] None  09/20/2023  1:00 PM [facility] PHLEBOTOMIST [facility] None  09/20/2023  1:00 PM Pasam, Archie Patten, MD [facility] None  09/20/2023  4:15 PM Vernell Barrier, PT [facility] None  09/21/2023  2:30 PM Glenford Bayley, NP [facility] None  09/28/2023  2:45 PM Vernell Barrier, PT [facility] None  10/04/2023  2:15 PM Kalman Shan, MD [facility] None  10/05/2023  2:45 PM Vernell Barrier, PT [facility] None  01/30/2024  3:00 PM Kathleene Hazel, MD [facility]       Kerry Hough, Paramedic [phone] [facility] Paramedic  08/29/23

## 2023-08-30 ENCOUNTER — Inpatient Hospital Stay: Payer: Medicare PPO

## 2023-08-30 ENCOUNTER — Inpatient Hospital Stay: Payer: Medicare PPO | Admitting: Oncology

## 2023-08-30 NOTE — Progress Notes (Unsigned)
Jocelyn [name] [address] [place] [address] Phone: [phone] Subjective:   Jocelyn [name], am serving as a scribe for Dr. Antoine Primas.  I'm seeing this patient by the request  of:  Renaye Rakers, MD  CC: Bilateral knee pain  [id number]  06/01/2023 Chronic problem with exacerbation.  Was given viscosupplementation as well.  Increase activity slowly otherwise.     Updated 08/31/2023 Jocelyn [name] is a 67 y.o. female coming in with complaint of B knee pain. Might wait.  Reviewing patient's chart in September patient was unfortunately found to have a massive pulmonary embolism that unfortunately did have PEA arrest requiring CPR and intubation.  Underwent thrombolytic therapy.  Had an IR performed thrombectomy.  Found to have right sided acute DVT.       Past Medical History:  Diagnosis Date   Acute bronchitis    Allergic rhinitis, cause unspecified    Anemia    Anxiety    B12 deficiency    BV (bacterial vaginosis) 06/22/1996   Calculus of kidney    Calculus of kidney    Coronary atherosclerosis of unspecified type of vessel, native or graft    Dizziness    Essential hypertension, benign    Fibroid 2003   Fibromyalgia    H/O dysmenorrhea 2008   H/O varicella    Headache(784.0)    frequently   HSV-2 infection 2009   Hyperplastic colon polyp 05/16/2014   Irritable bowel syndrome    Meniere's disease, unspecified    Menses, irregular 2003   Myalgia and myositis, unspecified    Myocardial infarction (HCC)    Obstructive sleep apnea (adult) (pediatric)    Perimenopausal symptoms 2003   Pure hypercholesterolemia    Sarcoidosis    Type II or unspecified type diabetes mellitus without mention of complication, not stated as uncontrolled    Unspecified venous (peripheral) insufficiency    Vitamin D deficiency    Vulvitis 2010   Yeast infection    Past Surgical History:  Procedure Laterality Date   COLONOSCOPY     CORONARY STENT  INTERVENTION N/A 06/15/2017   Procedure: CORONARY STENT INTERVENTION;  Surgeon: Corky Crafts, MD;  Location: [facility];  Service: Cardiovascular;  Laterality: N/A;   heart catherization     IR ANGIOGRAM PULMONARY BILATERAL SELECTIVE  08/04/2023   IR ANGIOGRAM SELECTIVE EACH ADDITIONAL VESSEL  08/04/2023   IR ANGIOGRAM SELECTIVE EACH ADDITIONAL VESSEL  08/04/2023   IR THROMBECT PRIM MECH INIT (INCLU) MOD SED  08/04/2023   IR US GUIDE VASC ACCESS RIGHT  08/04/2023   LEFT HEART CATH AND CORONARY ANGIOGRAPHY N/A 06/15/2017   Procedure: LEFT HEART CATH AND CORONARY ANGIOGRAPHY;  Surgeon: Corky Crafts, MD;  Location: [facility];  Service: Cardiovascular;  Laterality: N/A;   RADIOLOGY WITH ANESTHESIA N/A 08/04/2023   Procedure: IR WITH ANESTHESIA;  Surgeon: Julieanne Cotton, MD;  Location: [facility];  Service: Radiology;  Laterality: N/A;   TONSILLECTOMY     Social History   Socioeconomic History   Marital status: Single    Spouse name: Not on file   Number of children: 1   Years of education: Not on file   Highest education level: Not on file  Occupational History   Occupation: Retired Runner, broadcasting/film/video   Tobacco Use   Smoking status: Never   Smokeless tobacco: Never   Tobacco comments:    Daily Caffeine - 1  Exercise 2-3 times/weekly  Vaping Use  Vaping status: Never Used  Substance and Sexual Activity   Alcohol use: No    Alcohol/week: 0.0 standard drinks of alcohol   Drug use: No   Sexual activity: Yes    Birth control/protection: None  Other Topics Concern   Not on file  Social History Narrative   Exercises 2-3 times weekly   Caffeine Use: 1 daily (tea or Pepsi)   Lives alone in a one story home.  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     Social Determinants of Health   Financial Resource Strain: Low Risk  (08/05/2023)   Overall Financial Resource Strain (CARDIA)    Difficulty of Paying Living Expenses: Not hard at all  Food Insecurity: No Food Insecurity  (08/05/2023)   Hunger Vital Sign    Worried About Running Out of Food in the Last Year: Never true    Ran Out of Food in the Last Year: Never true  Transportation Needs: No Transportation Needs (08/05/2023)   PRAPARE - Administrator, Civil Service (Medical): No    Lack of Transportation (Non-Medical): No  Physical Activity: Not on file  Stress: No Stress Concern Present (08/05/2023)   [employer] of Occupational Health - Occupational Stress Questionnaire    Feeling of Stress : Not at all  Social Connections: Not on file   Allergies  Allergen Reactions   Actos [Pioglitazone] Other (See Comments)    REACTION: pt states INTOL w/ edema   Codeine Other (See Comments)    REACTION: vomiting   Januvia [Sitagliptin] Nausea Only   Lactose Intolerance (Gi) Diarrhea   Morphine Nausea Only and Nausea And Vomiting    REACTION: vomiting REACTION: vomiting   Repatha [Evolocumab]     WAS NOT EFFECTIVE IN LOWERING LDL   Family History  Adopted: Yes  Problem Relation Age of Onset   Other Other        ADOPTED   Colon cancer Neg Hx    Esophageal cancer Neg Hx    Rectal cancer Neg Hx    Stomach cancer Neg Hx     Current Outpatient Medications (Endocrine & Metabolic):    Empagliflozin-metFORMIN HCl ER 25-1000 MG TB24, Take 1 tablet by mouth daily.  Current Outpatient Medications (Cardiovascular):    icosapent Ethyl (VASCEPA) 1 g capsule, TAKE 2 CAPSULES(2 GRAMS) BY MOUTH TWICE DAILY   metoprolol succinate (TOPROL-XL) 100 MG 24 hr tablet, Take 1 tablet (100 mg total) by mouth daily. Take with or immediately following a meal.   NEXLETOL 180 MG TABS, TAKE 1 TABLET BY MOUTH DAILY   nitroGLYCERIN (NITROSTAT) 0.4 MG SL tablet, Place 1 tablet (0.4 mg total) under the tongue every 5 (five) minutes as needed for chest pain.   PRALUENT 150 MG/ML SOAJ, ADMINISTER 1 ML UNDER THE SKIN EVERY 14 DAYS   spironolactone (ALDACTONE) 25 MG tablet, TAKE 1 TABLET BY MOUTH EVERY DAY   Current  Outpatient Medications (Analgesics):    allopurinol (ZYLOPRIM) 100 MG tablet, Take 1 tablet by mouth daily as needed. (Patient not taking: Reported on 08/29/2023)  Current Outpatient Medications (Hematological):    apixaban (ELIQUIS) 5 MG TABS tablet, Take 10 mg by mouth twice daily, then on 08/17/2023-switch to 5 mg by mouth twice daily.  Current Outpatient Medications (Other):    ALPRAZolam (XANAX) 0.5 MG tablet, Take 0.5 mg by mouth 3 (three) times daily.   azaTHIOprine (IMURAN) 50 MG tablet, Take 1 tablet by mouth daily.   GEMTESA 75 MG TABS, Take 1 tablet by  mouth daily.   ONETOUCH VERIO test strip, CHECK BLOOD SUGAR TID AS DIRECTED   pantoprazole (PROTONIX) 40 MG tablet, Take 1 tablet (40 mg total) by mouth daily.   Reviewed prior external information including notes and imaging from  primary care provider As well as notes that were available from care everywhere and other healthcare systems.  Past medical history, social, surgical and family history all reviewed in electronic medical record.  No pertanent information unless stated regarding to the chief complaint.   Review of Systems:  No headache, visual changes, nausea, vomiting, diarrhea, constipation, dizziness, abdominal pain, skin rash, fevers, chills, night sweats, weight loss, swollen lymph nodes, body aches, joint swelling, chest pain, shortness of breath, mood changes. POSITIVE muscle aches  Objective  Blood pressure 124/76, pulse 90, height 5\' 2"  (1.575 m), weight 146 lb (66.2 kg), SpO2 99%.   General: No apparent distress alert and oriented x3 mood and affect normal, dressed appropriately.  Patient does appear frail HEENT: Pupils equal, extraocular movements intact  Respiratory: Patient's speak in full sentences and does not appear short of breath  Cardiovascular: No lower extremity edema, non tender, no erythema  Bilateral knee still have arthritic changes but only trace effusion noted.  Instability noted with valgus  and varus force.  Patient is able to get out of seat but needs arms to do so.    Impression and Recommendations:

## 2023-08-31 ENCOUNTER — Emergency Department (HOSPITAL_BASED_OUTPATIENT_CLINIC_OR_DEPARTMENT_OTHER)
Admission: EM | Admit: 2023-08-31 | Discharge: 2023-08-31 | Discharge status: Against Medical Advice | Payer: Medicare PPO | Attending: Emergency Medicine | Admitting: Emergency Medicine

## 2023-08-31 ENCOUNTER — Ambulatory Visit: Payer: Medicare PPO | Admitting: Family Medicine

## 2023-08-31 ENCOUNTER — Encounter (HOSPITAL_BASED_OUTPATIENT_CLINIC_OR_DEPARTMENT_OTHER): Payer: Self-pay | Admitting: Emergency Medicine

## 2023-08-31 ENCOUNTER — Other Ambulatory Visit: Payer: Self-pay

## 2023-08-31 VITALS — BP 124/76 | HR 90 | Ht 62.0 in | Wt 146.0 lb

## 2023-08-31 DIAGNOSIS — Z5321 Procedure and treatment not carried out due to patient leaving prior to being seen by health care provider: Secondary | ICD-10-CM | POA: Insufficient documentation

## 2023-08-31 DIAGNOSIS — R300 Dysuria: Secondary | ICD-10-CM | POA: Insufficient documentation

## 2023-08-31 DIAGNOSIS — M170 Bilateral primary osteoarthritis of knee: Secondary | ICD-10-CM | POA: Diagnosis not present

## 2023-08-31 LAB — URINALYSIS, MICROSCOPIC (REFLEX)

## 2023-08-31 LAB — URINALYSIS, ROUTINE W REFLEX MICROSCOPIC
Glucose, UA: 250 mg/dL — AB
Ketones, ur: 15 mg/dL — AB
Nitrite: POSITIVE — AB
Protein, ur: 100 mg/dL — AB
Specific Gravity, Urine: 1.025 (ref 1.005–1.030)
pH: 5.5 (ref 5.0–8.0)

## 2023-08-31 NOTE — Patient Instructions (Signed)
See you again in 2 months Can call us if you need to be seen sooner

## 2023-08-31 NOTE — ED Triage Notes (Signed)
Pt c/o persistent dysuria since the end of Sept; has completed Macrobid w/o relief

## 2023-09-01 ENCOUNTER — Emergency Department (HOSPITAL_BASED_OUTPATIENT_CLINIC_OR_DEPARTMENT_OTHER)
Admission: EM | Admit: 2023-09-01 | Discharge: 2023-09-01 | Disposition: A | Discharge status: Home / Self Care | Payer: Medicare PPO | Attending: Emergency Medicine | Admitting: Emergency Medicine

## 2023-09-01 ENCOUNTER — Other Ambulatory Visit (HOSPITAL_COMMUNITY): Payer: Self-pay | Admitting: Internal Medicine

## 2023-09-01 ENCOUNTER — Other Ambulatory Visit: Payer: Self-pay

## 2023-09-01 ENCOUNTER — Encounter: Payer: Self-pay | Admitting: Family Medicine

## 2023-09-01 DIAGNOSIS — N3001 Acute cystitis with hematuria: Secondary | ICD-10-CM | POA: Diagnosis not present

## 2023-09-01 DIAGNOSIS — Z7901 Long term (current) use of anticoagulants: Secondary | ICD-10-CM | POA: Diagnosis not present

## 2023-09-01 DIAGNOSIS — I10 Essential (primary) hypertension: Secondary | ICD-10-CM | POA: Insufficient documentation

## 2023-09-01 DIAGNOSIS — Z7984 Long term (current) use of oral hypoglycemic drugs: Secondary | ICD-10-CM | POA: Insufficient documentation

## 2023-09-01 DIAGNOSIS — Z79899 Other long term (current) drug therapy: Secondary | ICD-10-CM | POA: Diagnosis not present

## 2023-09-01 DIAGNOSIS — R300 Dysuria: Secondary | ICD-10-CM | POA: Diagnosis present

## 2023-09-01 DIAGNOSIS — E119 Type 2 diabetes mellitus without complications: Secondary | ICD-10-CM | POA: Diagnosis not present

## 2023-09-01 MED ORDER — CIPROFLOXACIN HCL 500 MG PO TABS: 500.0000 mg | ORAL_TABLET | Freq: Two times a day (BID) | ORAL | 0 refills | Status: AC

## 2023-09-01 NOTE — Assessment & Plan Note (Addendum)
Discussed with patient as well as primary caregiver.  At this point does have significant deconditioning from patient's illness.  Needs formal physical therapy which she has started recently.  Do believe pulmonary rehabilitation will be needed and is following up with pulmonary physicians next week.  Continue blood thinner indefinitely.  Will hold on injections at this time until patient feels better.  We did discuss though that the more activity she does the better so we do need to monitor the pain in the knee and if this is restricting her from any of the rehabilitation.  Will discuss then if so.  Follow-up again 2 to 3 months total time reviewing patient's chart as well as discussing with patient and primary caregiver 34 minutes

## 2023-09-01 NOTE — ED Triage Notes (Signed)
Pt POV from home with daughter reporting dysuria. Seen at [facility] yesterday and urine sample obtained, left due to wait times. Seen my PCP on 10/7 for same, prescribed abx, completed with no improvement

## 2023-09-01 NOTE — ED Provider Notes (Signed)
EMERGENCY DEPARTMENT AT [facility] Provider Note   CSN: [id number] Arrival date & time: 09/01/23  1453     History  Chief Complaint  Patient presents with   Dysuria    Jocelyn [name] is a 67 y.o. female with recent hospitalization for PE where catheter was placed, discharged on 08/03/2023, hypertension, type 2 diabetes, presents with concern for dysuria for 2 weeks since her hospital discharge.  Denies any hematuria or increased frequency.  Denies any fever, chills, abdominal pain, back pain.  Denies any nausea or vomiting.   Dysuria      Home Medications Prior to Admission medications   Medication Sig Start Date End Date Taking? Authorizing Provider  ciprofloxacin (CIPRO) 500 MG tablet Take 1 tablet (500 mg total) by mouth every 12 (twelve) hours for 3 days. 09/01/23 09/04/23 Yes Arabella Merles, PA-C  allopurinol (ZYLOPRIM) 100 MG tablet Take 1 tablet by mouth daily as needed. Patient not taking: Reported on 08/29/2023 09/25/21   [provider]  ALPRAZolam Prudy Feeler) 0.5 MG tablet Take 0.5 mg by mouth 3 (three) times daily. 07/15/23   [provider]  apixaban (ELIQUIS) 5 MG TABS tablet Take 10 mg by mouth twice daily, then on 08/17/2023-switch to 5 mg by mouth twice daily. 08/11/23   Ghimire, Werner Lean, MD  azaTHIOprine (IMURAN) 50 MG tablet Take 1 tablet by mouth daily.    [provider]  Empagliflozin-metFORMIN HCl ER 25-1000 MG TB24 Take 1 tablet by mouth daily.    [provider]  GEMTESA 75 MG TABS Take 1 tablet by mouth daily. 12/31/21   [provider]  icosapent Ethyl (VASCEPA) 1 g capsule TAKE 2 CAPSULES(2 GRAMS) BY MOUTH TWICE DAILY 02/03/23   Corky Crafts, MD  metoprolol succinate (TOPROL-XL) 100 MG 24 hr tablet Take 1 tablet (100 mg total) by mouth daily. Take with or immediately following a meal. 08/23/23 11/21/23  Jacklynn Ganong, FNP  NEXLETOL 180 MG TABS TAKE 1 TABLET BY MOUTH DAILY 07/20/23    Corky Crafts, MD  nitroGLYCERIN (NITROSTAT) 0.4 MG SL tablet Place 1 tablet (0.4 mg total) under the tongue every 5 (five) minutes as needed for chest pain. 02/08/22   Corky Crafts, MD  ONETOUCH VERIO test strip CHECK BLOOD SUGAR TID AS DIRECTED 10/04/19   [provider]  pantoprazole (PROTONIX) 40 MG tablet Take 1 tablet (40 mg total) by mouth daily. 08/23/23   Jacklynn Ganong, FNP  PRALUENT 150 MG/ML SOAJ ADMINISTER 1 ML UNDER THE SKIN EVERY 14 DAYS 08/29/23   Kathleene Hazel, MD  spironolactone (ALDACTONE) 25 MG tablet TAKE 1 TABLET BY MOUTH EVERY DAY 02/01/23   Corky Crafts, MD      Allergies    Actos [pioglitazone], Codeine, Januvia [sitagliptin], Lactose intolerance (gi), Morphine, and Repatha [evolocumab]    Review of Systems   Review of Systems  Genitourinary:  Positive for dysuria.    Physical Exam Updated Vital Signs BP 107/82 (BP Location: Right Arm)   Pulse 83   Temp 98.1 F (36.7 C) (Oral)   Resp 18   Ht 5\' 2"  (1.575 m)   Wt 66.2 kg   SpO2 100%   BMI 26.70 kg/m  Physical Exam Vitals and nursing note reviewed.  Constitutional:      General: She is not in acute distress.    Appearance: She is well-developed.  HENT:     Head: Normocephalic and atraumatic.  Eyes:     Conjunctiva/sclera:  Conjunctivae normal.  Cardiovascular:     Rate and Rhythm: Normal rate and regular rhythm.     Heart sounds: No murmur heard. Pulmonary:     Effort: Pulmonary effort is normal. No respiratory distress.     Breath sounds: Normal breath sounds.  Abdominal:     Palpations: Abdomen is soft.     Tenderness: There is no abdominal tenderness.     Comments: No abdominal tenderness to palpation, no CVA tenderness  Musculoskeletal:        General: No swelling.     Cervical back: Neck supple.  Skin:    General: Skin is warm and dry.     Capillary Refill: Capillary refill takes less than 2 seconds.  Neurological:     Mental Status: She is  alert.  Psychiatric:        Mood and Affect: Mood normal.     ED Results / Procedures / Treatments   Labs (all labs ordered are listed, but only abnormal results are displayed) Labs Reviewed  URINE CULTURE    EKG None  Radiology No results found.  Procedures Procedures    Medications Ordered in ED Medications - No data to display  ED Course/ Medical Decision Making/ A&P                                 Medical Decision Making Amount and/or Complexity of Data Reviewed Labs: ordered.   67 y.o. female with pertinent past medical history of Recent hospitalization for PE on 08/03/23 where catheter was placed, type 2 diabetes, hypertension presents to the ED for concern of dysuria for 2 weeks  Differential diagnosis includes but is not limited to acute cystitis, pyelonephritis, nephrolithiasis  ED Course:  Patient overall well-appearing with normal vital signs.  She has been having dysuria ever since her hospital discharge on 08/03/2023.  At this admission, she reports she had a catheter placed.  She presented last night but left before being evaluated.  Her urine from yesterday shows nitrates, leukocytes, bilirubin, ketones, protein, hemoglobin.  Suspect some dehydration given proteinuria. She denies any abdominal pain, vomiting, back pain.  No CVA tenderness, no abdominal pain on exam.  No vital sign abnormalities, no concern for pyelonephritis at this time.  She has failed a course of Macrobid from her primary care provider.  Will switch her to ciprofloxacin.   Impression: Urinary tract infection  Disposition:  The patient was discharged home with instructions to take 3-day course of ciprofloxacin as prescribed.  Follow-up with her PCP if symptoms do not start improving in the next 3 days.  Patient knows she will be called if the urine culture comes back and we need to change her antibiotic.   Return precautions given.  Lab Tests: I Ordered, and personally interpreted  labs.  The pertinent results include:   Urinalysis from last night with nitrites, leukocytes, hemoglobin, bilirubin, ketones, protein, glucose  External records from outside source obtained and reviewed including hospital discharge from 08/03/2023 where she was hospitalized for PE   Co morbidities that complicate the patient evaluation  Recent hospitalization for PE where catheter was placed, type 2 diabetes, hypertension             Final Clinical Impression(s) / ED Diagnoses Final diagnoses:  Acute cystitis with hematuria    Rx / DC Orders ED Discharge Orders          Ordered    ciprofloxacin (  CIPRO) 500 MG tablet  Every 12 hours        09/01/23 1843              Arabella Merles, PA-C 09/01/23 1917    Estelle June A, DO 09/05/23 0900

## 2023-09-01 NOTE — Discharge Instructions (Addendum)
You have a urinary tract infection.  We have sent a urine culture out.  You will be contacted if your antibiotic needs to be changed.  You have been prescribed an antibiotic called ciprofloxacin. Take this antibiotic 2 times a day for the next 3 days. Take the full course of your antibiotic even if you start feeling better. Antibiotics may cause you to have diarrhea.  If you continue to have diarrhea after stopping this antibiotic and it is very foul-smelling or green, please return to here your PCP for further evaluation as this would be concerning for an infection called C. Difficile which needs to be treated.  Return to the ER for any fever, chills, abdominal pain, vomiting, any other new or concerning symptoms.

## 2023-09-03 LAB — URINE CULTURE: Culture: >=100000 — AB

## 2023-09-04 ENCOUNTER — Telehealth (HOSPITAL_BASED_OUTPATIENT_CLINIC_OR_DEPARTMENT_OTHER): Payer: Self-pay | Admitting: *Deleted

## 2023-09-04 NOTE — Telephone Encounter (Signed)
Post ED Visit - Positive Culture Follow-up  Culture report reviewed by antimicrobial stewardship pharmacist: [facility] []  Enzo Bi, Pharm.D. []  Celedonio Miyamoto, Pharm.D., BCPS AQ-ID []  Garvin Fila, Pharm.D., BCPS []  Georgina Pillion, Pharm.D., BCPS []  [place], [place].D., BCPS, AAHIVP []  Estella Husk, Pharm.D., BCPS, AAHIVP []  Lysle Pearl, PharmD, BCPS []  Phillips Climes, PharmD, BCPS []  Agapito Games, PharmD, BCPS []  Verlan Friends, PharmD []  Mervyn Gay, PharmD, BCPS [x]  Estill Batten, PharmD  [facility] []  Len Childs, PharmD []  Greer Pickerel, PharmD []  Adalberto Cole, PharmD []  Perlie Gold, Rph []  Lonell Face) Jean Rosenthal, PharmD []  Earl Many, PharmD []  Junita Push, PharmD []  Dorna Leitz, PharmD []  Terrilee Files, PharmD []  Lynann Beaver, PharmD []  Keturah Barre, PharmD []  Loralee Pacas, PharmD []  Bernadene Person, PharmD   Positive urine culture Treated with Ciprofloxacin, organism sensitive to the same and no further patient follow-up is required at this time.  Jocelyn [name] 09/04/2023, 12:30 PM

## 2023-09-05 NOTE — Plan of Care (Signed)
[facility] Tonsillectomy/Adenoidectomy, Postoperative PEDS care plan entered in error.

## 2023-09-07 ENCOUNTER — Other Ambulatory Visit (HOSPITAL_COMMUNITY): Payer: Self-pay

## 2023-09-07 NOTE — Progress Notes (Signed)
Paramedicine Encounter    Patient ID: Jocelyn [name], female    DOB: 14-May-1956, 67 y.o.   MRN: [id number]   Complaints-better---still sore  Edema-none   Compliance with meds-yes   Pill box filled-gave pill box  If so, by whom-daughter and pt will fill   Refills needed-none    Pt reports she is doing better. She looks and sounds better than when I seen her last week. Her voice is stronger, her ribs and chest still sore though.  Appetite getting better. Her UTI symptoms have improved. She went to [facility] and was given a 3 day course of stronger antibiotics.  She was seen by PCP yesterday and got another urinalysis to check again.  She is due to be seen back in clinic in 2-3 months with ECHO.  Will make sure that gets sch.   Drinking maybe one bottle of water a day. Advised her to increase that to 2-3 bottles per day. It will help with her UTI issues and her softer b/p.  She doesn't have any dizziness, no c/p (cardiac) no sob.  Will see her again next week.  Per daughter and pt agreed staying at friends house a bit longer, not ready to go back to her house yet.    BP 100/60   Pulse 82   Resp 16   SpO2 98%  Weight yesterday-? Last visit weight-?  Patient Care Team: Renaye Rakers, MD as PCP - General (Family Medicine) Corky Crafts, MD as PCP - Cardiology (Cardiology)  Patient Active Problem List   Diagnosis Date Noted   Physical deconditioning 08/23/2023   Acute respiratory failure with hypoxia (HCC) 08/05/2023   Pulmonary embolism (HCC) 08/03/2023   Pulmonary edema 08/03/2023   Degenerative arthritis of knee 08/20/2021   Hx of pleural effusion    Dyspnea 08/16/2018   Fall on or from sidewalk curb, initial encounter 05/31/2018   History of acute inferior wall MI 08/22/2017   Diabetes mellitus (HCC)    S/P right coronary artery (RCA) stent placement    Overweight 05/05/2017   Diabetes mellitus type 2, controlled, without complications (HCC) 12/16/2016    Asthmatic bronchitis , chronic (HCC) 05/10/2016   Polymyositis (HCC) 09/15/2015   History of sarcoidosis 09/15/2015   Falling 03/13/2015   Edema 02/13/2014   Coronary artery disease involving native heart without angina pectoris 10/29/2013   Hyperlipidemia 10/29/2013   Iron (Fe) deficiency anemia 08/08/2013   Vitamin B 12 deficiency 08/08/2013   GERD (gastroesophageal reflux disease) 09/08/2011   Weakness 02/22/2011   Depression 02/22/2011   MENIERE'S DISEASE 04/30/2009   Vitamin D deficiency 02/05/2009   Venous (peripheral) insufficiency 12/12/2007   RENAL CALCULUS 12/12/2007   DIZZINESS 12/12/2007   HYPERCHOLESTEROLEMIA 09/16/2007   Anxiety state 09/16/2007   HYPERTENSION, BENIGN 09/16/2007   Allergic rhinitis 09/16/2007   IRRITABLE BOWEL SYNDROME 09/16/2007   Myalgia and myositis 09/16/2007    Current Outpatient Medications:    ALPRAZolam (XANAX) 0.5 MG tablet, Take 0.5 mg by mouth 3 (three) times daily., Disp: , Rfl:    apixaban (ELIQUIS) 5 MG TABS tablet, Take 10 mg by mouth twice daily, then on 08/17/2023-switch to 5 mg by mouth twice daily., Disp: 180 tablet, Rfl: 3   azaTHIOprine (IMURAN) 50 MG tablet, Take 1 tablet by mouth daily., Disp: , Rfl:    Empagliflozin-metFORMIN HCl ER 25-1000 MG TB24, Take 1 tablet by mouth daily., Disp: , Rfl:    GEMTESA 75 MG TABS, Take 1 tablet by mouth daily., Disp: ,  Rfl:    icosapent Ethyl (VASCEPA) 1 g capsule, TAKE 2 CAPSULES(2 GRAMS) BY MOUTH TWICE DAILY, Disp: 360 capsule, Rfl: 3   metoprolol succinate (TOPROL-XL) 100 MG 24 hr tablet, Take 1 tablet (100 mg total) by mouth daily. Take with or immediately following a meal., Disp: 90 tablet, Rfl: 3   NEXLETOL 180 MG TABS, TAKE 1 TABLET BY MOUTH DAILY, Disp: 90 tablet, Rfl: 1   nitroGLYCERIN (NITROSTAT) 0.4 MG SL tablet, Place 1 tablet (0.4 mg total) under the tongue every 5 (five) minutes as needed for chest pain., Disp: 25 tablet, Rfl: 2   ONETOUCH VERIO test strip, CHECK BLOOD SUGAR TID  AS DIRECTED, Disp: , Rfl:    pantoprazole (PROTONIX) 40 MG tablet, Take 1 tablet (40 mg total) by mouth daily., Disp: 30 tablet, Rfl: 0   PRALUENT 150 MG/ML SOAJ, ADMINISTER 1 ML UNDER THE SKIN EVERY 14 DAYS, Disp: 6 mL, Rfl: 3   spironolactone (ALDACTONE) 25 MG tablet, TAKE 1 TABLET BY MOUTH EVERY DAY, Disp: 90 tablet, Rfl: 2   allopurinol (ZYLOPRIM) 100 MG tablet, Take 1 tablet by mouth daily as needed. (Patient not taking: Reported on 09/07/2023), Disp: , Rfl:  Allergies  Allergen Reactions   Actos [Pioglitazone] Other (See Comments)    REACTION: pt states INTOL w/ edema   Codeine Other (See Comments)    REACTION: vomiting   Januvia [Sitagliptin] Nausea Only   Lactose Intolerance (Gi) Diarrhea   Morphine Nausea Only and Nausea And Vomiting    REACTION: vomiting REACTION: vomiting   Repatha [Evolocumab]     WAS NOT EFFECTIVE IN LOWERING LDL      Social History   Socioeconomic History   Marital status: Single    Spouse name: Not on file   Number of children: 1   Years of education: Not on file   Highest education level: Not on file  Occupational History   Occupation: Retired Runner, broadcasting/film/video   Tobacco Use   Smoking status: Never   Smokeless tobacco: Never   Tobacco comments:    Daily Caffeine - 1  Exercise 2-3 times/weekly  Vaping Use   Vaping status: Never Used  Substance and Sexual Activity   Alcohol use: No    Alcohol/week: 0.0 standard drinks of alcohol   Drug use: No   Sexual activity: Yes    Birth control/protection: None  Other Topics Concern   Not on file  Social History Narrative   Exercises 2-3 times weekly   Caffeine Use: 1 daily (tea or Pepsi)   Lives alone in a one story home.  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     Social Determinants of Health   Financial Resource Strain: Low Risk  (08/05/2023)   Overall Financial Resource Strain (CARDIA)    Difficulty of Paying Living Expenses: Not hard at all  Food Insecurity: No Food Insecurity (08/05/2023)    Hunger Vital Sign    Worried About Running Out of Food in the Last Year: Never true    Ran Out of Food in the Last Year: Never true  Transportation Needs: No Transportation Needs (08/05/2023)   PRAPARE - Administrator, Civil Service (Medical): No    Lack of Transportation (Non-Medical): No  Physical Activity: Not on file  Stress: No Stress Concern Present (08/05/2023)   [employer] of Occupational Health - Occupational Stress Questionnaire    Feeling of Stress : Not at all  Social Connections: Not on file  Intimate Partner Violence: Not At Risk (  08/05/2023)   Humiliation, Afraid, Rape, and Kick questionnaire    Fear of Current or Ex-Partner: No    Emotionally Abused: No    Physically Abused: No    Sexually Abused: No    Physical Exam      Future Appointments  Date Time Provider Department Center  09/15/2023  2:45 PM Mikey Kirschner B, PT [facility] None  09/20/2023  1:00 PM [facility] PHLEBOTOMIST [facility] None  09/20/2023  1:00 PM Pasam, Archie Patten, MD [facility] None  09/20/2023  4:15 PM Mikey Kirschner B, PT [facility] None  09/21/2023  2:30 PM Glenford Bayley, NP [facility] None  09/28/2023  2:45 PM Vernell Barrier, PT [facility] None  10/04/2023  2:15 PM Kalman Shan, MD [facility] None  10/05/2023  2:45 PM Mikey Kirschner B, PT [facility] None  11/02/2023  2:15 PM Judi Saa, DO [facility] None  01/30/2024  3:00 PM Kathleene Hazel, MD [facility]       Kerry Hough, Paramedic [phone] [facility] Paramedic  09/07/23

## 2023-09-12 MED FILL — Medication: Qty: 1 | Status: AC

## 2023-09-14 ENCOUNTER — Other Ambulatory Visit (HOSPITAL_COMMUNITY): Payer: Self-pay

## 2023-09-14 NOTE — Progress Notes (Signed)
Paramedicine Encounter    Patient ID: Lucienne Minks, female    DOB: Mar 26, 1956, 67 y.o.   MRN: [id number]   Complaints-sore  Edema-none  Compliance with meds-yes  Pill box filled-daughter fills it  If so, by whom-daughter   Refills needed-none  Pt reports she is doing good., she came to door to open it. She is still sore from the chest compressions, but she knows it will take some time to feel better.   She is liking the pill box that I left with her last time.  Daughter is filling it up.  Appetite still improving.   She denies increased sob, no worse than usual. She does get sob if she does too much.   Fluid intake still working on how to improve that.  They are working on her house to make is safer for her-they have poured new concrete instead of the paving stones and securing the step rails too.  She is getting closer to getting back in her home.   She has a few appointments next week, I will f/u in 2 wks.  She looks good today.    BP 104/80   Pulse 82   Resp 16   SpO2 98%  Weight yesterday-? Last visit weight-147 @ office   Patient Care Team: Renaye Rakers, MD as PCP - General (Family Medicine) Corky Crafts, MD as PCP - Cardiology (Cardiology)  Patient Active Problem List   Diagnosis Date Noted   Physical deconditioning 08/23/2023   Acute respiratory failure with hypoxia (HCC) 08/05/2023   Pulmonary embolism (HCC) 08/03/2023   Pulmonary edema 08/03/2023   Degenerative arthritis of knee 08/20/2021   Hx of pleural effusion    Dyspnea 08/16/2018   Fall on or from sidewalk curb, initial encounter 05/31/2018   History of acute inferior wall MI 08/22/2017   Diabetes mellitus (HCC)    S/P right coronary artery (RCA) stent placement    Overweight 05/05/2017   Diabetes mellitus type 2, controlled, without complications (HCC) 12/16/2016   Asthmatic bronchitis , chronic (HCC) 05/10/2016   Polymyositis (HCC) 09/15/2015   History of sarcoidosis 09/15/2015    Falling 03/13/2015   Edema 02/13/2014   Coronary artery disease involving native heart without angina pectoris 10/29/2013   Hyperlipidemia 10/29/2013   Iron (Fe) deficiency anemia 08/08/2013   Vitamin B 12 deficiency 08/08/2013   GERD (gastroesophageal reflux disease) 09/08/2011   Weakness 02/22/2011   Depression 02/22/2011   MENIERE'S DISEASE 04/30/2009   Vitamin D deficiency 02/05/2009   Venous (peripheral) insufficiency 12/12/2007   RENAL CALCULUS 12/12/2007   DIZZINESS 12/12/2007   HYPERCHOLESTEROLEMIA 09/16/2007   Anxiety state 09/16/2007   HYPERTENSION, BENIGN 09/16/2007   Allergic rhinitis 09/16/2007   IRRITABLE BOWEL SYNDROME 09/16/2007   Myalgia and myositis 09/16/2007    Current Outpatient Medications:    allopurinol (ZYLOPRIM) 100 MG tablet, Take 1 tablet by mouth daily as needed., Disp: , Rfl:    ALPRAZolam (XANAX) 0.5 MG tablet, Take 0.5 mg by mouth 3 (three) times daily., Disp: , Rfl:    apixaban (ELIQUIS) 5 MG TABS tablet, Take 10 mg by mouth twice daily, then on 08/17/2023-switch to 5 mg by mouth twice daily., Disp: 180 tablet, Rfl: 3   azaTHIOprine (IMURAN) 50 MG tablet, Take 1 tablet by mouth daily., Disp: , Rfl:    Empagliflozin-metFORMIN HCl ER 25-1000 MG TB24, Take 1 tablet by mouth daily., Disp: , Rfl:    GEMTESA 75 MG TABS, Take 1 tablet by mouth daily., Disp: ,  Rfl:    icosapent Ethyl (VASCEPA) 1 g capsule, TAKE 2 CAPSULES(2 GRAMS) BY MOUTH TWICE DAILY, Disp: 360 capsule, Rfl: 3   metoprolol succinate (TOPROL-XL) 100 MG 24 hr tablet, Take 1 tablet (100 mg total) by mouth daily. Take with or immediately following a meal., Disp: 90 tablet, Rfl: 3   NEXLETOL 180 MG TABS, TAKE 1 TABLET BY MOUTH DAILY, Disp: 90 tablet, Rfl: 1   nitroGLYCERIN (NITROSTAT) 0.4 MG SL tablet, Place 1 tablet (0.4 mg total) under the tongue every 5 (five) minutes as needed for chest pain., Disp: 25 tablet, Rfl: 2   ONETOUCH VERIO test strip, CHECK BLOOD SUGAR TID AS DIRECTED, Disp: , Rfl:     pantoprazole (PROTONIX) 40 MG tablet, Take 1 tablet (40 mg total) by mouth daily., Disp: 30 tablet, Rfl: 0   PRALUENT 150 MG/ML SOAJ, ADMINISTER 1 ML UNDER THE SKIN EVERY 14 DAYS, Disp: 6 mL, Rfl: 3   spironolactone (ALDACTONE) 25 MG tablet, TAKE 1 TABLET BY MOUTH EVERY DAY, Disp: 90 tablet, Rfl: 2 Allergies  Allergen Reactions   Actos [Pioglitazone] Other (See Comments)    REACTION: pt states INTOL w/ edema   Codeine Other (See Comments)    REACTION: vomiting   Januvia [Sitagliptin] Nausea Only   Lactose Intolerance (Gi) Diarrhea   Morphine Nausea Only and Nausea And Vomiting    REACTION: vomiting REACTION: vomiting   Repatha [Evolocumab]     WAS NOT EFFECTIVE IN LOWERING LDL      Social History   Socioeconomic History   Marital status: Single    Spouse name: Not on file   Number of children: 1   Years of education: Not on file   Highest education level: Not on file  Occupational History   Occupation: Retired Runner, broadcasting/film/video   Tobacco Use   Smoking status: Never   Smokeless tobacco: Never   Tobacco comments:    Daily Caffeine - 1  Exercise 2-3 times/weekly  Vaping Use   Vaping status: Never Used  Substance and Sexual Activity   Alcohol use: No    Alcohol/week: 0.0 standard drinks of alcohol   Drug use: No   Sexual activity: Yes    Birth control/protection: None  Other Topics Concern   Not on file  Social History Narrative   Exercises 2-3 times weekly   Caffeine Use: 1 daily (tea or Pepsi)   Lives alone in a one story home.  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     Social Determinants of Health   Financial Resource Strain: Low Risk  (08/05/2023)   Overall Financial Resource Strain (CARDIA)    Difficulty of Paying Living Expenses: Not hard at all  Food Insecurity: No Food Insecurity (08/05/2023)   Hunger Vital Sign    Worried About Running Out of Food in the Last Year: Never true    Ran Out of Food in the Last Year: Never true  Transportation Needs: No  Transportation Needs (08/05/2023)   PRAPARE - Administrator, Civil Service (Medical): No    Lack of Transportation (Non-Medical): No  Physical Activity: Not on file  Stress: No Stress Concern Present (08/05/2023)   [employer] of Occupational Health - Occupational Stress Questionnaire    Feeling of Stress : Not at all  Social Connections: Not on file  Intimate Partner Violence: Not At Risk (08/05/2023)   Humiliation, Afraid, Rape, and Kick questionnaire    Fear of Current or Ex-Partner: No    Emotionally Abused: No  Physically Abused: No    Sexually Abused: No    Physical Exam      Future Appointments  Date Time Provider Department Center  09/15/2023  2:45 PM Mikey Kirschner B, PT [facility] None  09/20/2023  1:00 PM [facility] PHLEBOTOMIST [facility] None  09/20/2023  1:00 PM Pasam, Archie Patten, MD [facility] None  09/20/2023  4:15 PM Mikey Kirschner B, PT [facility] None  09/21/2023  2:30 PM Glenford Bayley, NP [facility] None  09/28/2023  2:45 PM Vernell Barrier, PT [facility] None  10/04/2023  2:15 PM Kalman Shan, MD [facility] None  10/05/2023  2:45 PM Mikey Kirschner B, PT [facility] None  11/02/2023  2:15 PM Judi Saa, DO [facility] None  01/30/2024  3:00 PM Kathleene Hazel, MD [facility]       Kerry Hough, Paramedic [phone] [facility] Paramedic  09/14/23

## 2023-09-15 ENCOUNTER — Ambulatory Visit: Payer: Medicare PPO

## 2023-09-15 ENCOUNTER — Telehealth: Payer: Self-pay

## 2023-09-15 DIAGNOSIS — R262 Difficulty in walking, not elsewhere classified: Secondary | ICD-10-CM

## 2023-09-15 DIAGNOSIS — M6281 Muscle weakness (generalized): Secondary | ICD-10-CM

## 2023-09-15 DIAGNOSIS — R252 Cramp and spasm: Secondary | ICD-10-CM

## 2023-09-15 DIAGNOSIS — M2681 Anterior soft tissue impingement: Secondary | ICD-10-CM | POA: Diagnosis not present

## 2023-09-15 DIAGNOSIS — R6889 Other general symptoms and signs: Secondary | ICD-10-CM

## 2023-09-15 DIAGNOSIS — R293 Abnormal posture: Secondary | ICD-10-CM

## 2023-09-15 NOTE — Telephone Encounter (Signed)
Tried to call patient, voicemail box full. Sent patient a MyChart message that appointment needs to be rescheduled due to Dr. Arlana Pouch not being in the office.

## 2023-09-15 NOTE — Therapy (Signed)
OUTPATIENT PHYSICAL THERAPY LOWER EXTREMITY TREATMENT   Patient Name: Jocelyn [name] MRN: [id number] DOB:1956/09/02, 67 y.o., female Today's Date: 09/15/2023  END OF SESSION:  PT End of Session - 09/15/23 1501     Visit Number 2    Date for PT Re-Evaluation 10/19/23    Authorization Type [employer]    PT Start Time 1450    PT Stop Time 1530    PT Time Calculation (min) 40 min    Activity Tolerance Patient limited by fatigue    Behavior During Therapy Flat affect             Past Medical History:  Diagnosis Date   Acute bronchitis    Allergic rhinitis, cause unspecified    Anemia    Anxiety    B12 deficiency    BV (bacterial vaginosis) 06/22/1996   Calculus of kidney    Calculus of kidney    Coronary atherosclerosis of unspecified type of vessel, native or graft    Dizziness    Essential hypertension, benign    Fibroid 2003   Fibromyalgia    H/O dysmenorrhea 2008   H/O varicella    Headache(784.0)    frequently   HSV-2 infection 2009   Hyperplastic colon polyp 05/16/2014   Irritable bowel syndrome    Meniere's disease, unspecified    Menses, irregular 2003   Myalgia and myositis, unspecified    Myocardial infarction (HCC)    Obstructive sleep apnea (adult) (pediatric)    Perimenopausal symptoms 2003   Pure hypercholesterolemia    Sarcoidosis    Type II or unspecified type diabetes mellitus without mention of complication, not stated as uncontrolled    Unspecified venous (peripheral) insufficiency    Vitamin D deficiency    Vulvitis 2010   Yeast infection    Past Surgical History:  Procedure Laterality Date   COLONOSCOPY     CORONARY STENT INTERVENTION N/A 06/15/2017   Procedure: CORONARY STENT INTERVENTION;  Surgeon: Corky Crafts, MD;  [facility]: [facility];  Service: Cardiovascular;  Laterality: N/A;   heart catherization     IR ANGIOGRAM PULMONARY BILATERAL SELECTIVE  08/04/2023   IR ANGIOGRAM SELECTIVE EACH ADDITIONAL VESSEL   08/04/2023   IR ANGIOGRAM SELECTIVE EACH ADDITIONAL VESSEL  08/04/2023   IR THROMBECT PRIM MECH INIT (INCLU) MOD SED  08/04/2023   IR US GUIDE VASC ACCESS RIGHT  08/04/2023   LEFT HEART CATH AND CORONARY ANGIOGRAPHY N/A 06/15/2017   Procedure: LEFT HEART CATH AND CORONARY ANGIOGRAPHY;  Surgeon: Corky Crafts, MD;  Location: [facility];  Service: Cardiovascular;  Laterality: N/A;   RADIOLOGY WITH ANESTHESIA N/A 08/04/2023   Procedure: IR WITH ANESTHESIA;  Surgeon: Julieanne Cotton, MD;  Location: [facility];  Service: Radiology;  Laterality: N/A;   TONSILLECTOMY     Patient Active Problem List   Diagnosis Date Noted   Physical deconditioning 08/23/2023   Acute respiratory failure with hypoxia (HCC) 08/05/2023   Pulmonary embolism (HCC) 08/03/2023   Pulmonary edema 08/03/2023   Degenerative arthritis of knee 08/20/2021   Hx of pleural effusion    Dyspnea 08/16/2018   Fall on or from sidewalk curb, initial encounter 05/31/2018   History of acute inferior wall MI 08/22/2017   Diabetes mellitus (HCC)    S/P right coronary artery (RCA) stent placement    Overweight 05/05/2017   Diabetes mellitus type 2, controlled, without complications (HCC) 12/16/2016   Asthmatic bronchitis , chronic (HCC) 05/10/2016   Polymyositis (HCC) 09/15/2015  History of sarcoidosis 09/15/2015   Falling 03/13/2015   Edema 02/13/2014   Coronary artery disease involving native heart without angina pectoris 10/29/2013   Hyperlipidemia 10/29/2013   Iron (Fe) deficiency anemia 08/08/2013   Vitamin B 12 deficiency 08/08/2013   GERD (gastroesophageal reflux disease) 09/08/2011   Weakness 02/22/2011   Depression 02/22/2011   MENIERE'S DISEASE 04/30/2009   Vitamin D deficiency 02/05/2009   Venous (peripheral) insufficiency 12/12/2007   RENAL CALCULUS 12/12/2007   DIZZINESS 12/12/2007   HYPERCHOLESTEROLEMIA 09/16/2007   Anxiety state 09/16/2007   HYPERTENSION, BENIGN 09/16/2007   Allergic rhinitis  09/16/2007   IRRITABLE BOWEL SYNDROME 09/16/2007   Myalgia and myositis 09/16/2007    PCP: Renaye Rakers, MD   REFERRING PROVIDER: Jacklynn Ganong, FNP  REFERRING DIAG: [phone] (ICD-10-CM) - Physical deconditioning  THERAPY DIAG:  Difficulty in walking, not elsewhere classified  Muscle weakness (generalized)  Decreased activity tolerance  Cramp and spasm  Abnormal posture  Rationale for Evaluation and Treatment: Rehabilitation  ONSET DATE: 08/23/2023  SUBJECTIVE:   SUBJECTIVE STATEMENT: Patient arrives with daughter.  She comes to stand with much improved tolerance and is standing tall.  No assist from dtr.  She is more talkative and states she is not in as much pain.  Dtr states she c/o pain in the chest upon standing.  We discuss that this is likely due to her having her shoulders rounded from being sore and hunching over.  Now that she is standing taller, she is likely feeling the tightness from this posture for several weeks.     PERTINENT HISTORY: na PAIN:  Are you having pain? No  PRECAUTIONS: Other: RECENT PE, MONITOR O2 AND HR!  RED FLAGS: None   WEIGHT BEARING RESTRICTIONS: No  FALLS:  Has patient fallen in last 6 months? No  LIVING ENVIRONMENT: Lives with: lives alone Lives in: House/apartment Stairs: Yes: Internal: 12 steps; on right going up and External: 4 steps; on left going up Has following equipment at home: None  OCCUPATION: get details next visit  PLOF: Independent, Independent with basic ADLs, Independent with household mobility without device, Independent with community mobility without device, Independent with homemaking with ambulation, Independent with gait, and Independent with transfers  PATIENT GOALS: To be able to get back to things I enjoy doing: working part time, sorority events, doing everything independently, driving  NEXT MD VISIT: PRN  OBJECTIVE:  Note: Objective measures were completed at Evaluation unless otherwise  noted.  DIAGNOSTIC FINDINGS: na  COGNITION: Overall cognitive status:  patient wasn't talking much and had flat affect, dtr spoke for patient most of the session      SENSATION: WFL   MUSCLE LENGTH: Generally good flexibility  POSTURE: rounded shoulders and forward head  PALPATION: na  LOWER EXTREMITY ROM:  WNL  LOWER EXTREMITY MMT:  Extremely weak all over: inconclusive due to patient effort- she wasn't feeling well Generally 3 to 3+/5 upper and lower extremities   FUNCTIONAL TESTS: Target HR 122,  Max HR 152 5 times sit to stand: 33.91 With use of hands with O2 sat at no lower than 95% and HR at max 114 Timed up and go (TUG): 19.62 with O2 drop to no lower than 97% and HR max at 111   GAIT: Distance walked: 30 feet Assistive device utilized: None Level of assistance: SBA Comments: slow, low energy   TODAY'S TREATMENT:  DATE: 09/15/23 Nustep x 6 min level 2 (PT present to discuss status) Seated LAQ with 2.5 lbs 2 x 10 each LE (verbal cues for full extension) Seated march x 20 with 2.5 lbs Sit to stand 2 x 5 without UE support Pulse ox measurement: patient remained within 97-99 O2 and 74-77 HR At counter with 6 inch step: alternating tap ups with single UE support 4" step up x 10 each LE at counter  Pulse ox measurement: patient remained within 97-99 O2 and 74-77 HR Seated clam 2 x 10 with yellow loop  Seated bicep curls with 1 lb 2 x 10 Seated overhead press with 1 lb 2 x 10   DATE: 08/24/23 Initial eval completed and initiated HEP    PATIENT EDUCATION:  Education details: initiated HEP and educated on how to determine target heart rate and max heart rate Person educated: Patient and Child(ren) Education method: Programmer, multimedia, Facilities manager, Verbal cues, and Handouts Education comprehension: verbalized understanding, returned  demonstration, and verbal cues required  HOME EXERCISE PROGRAM: Access Code: [email or web address] URL: [email or web address] Date: 08/24/2023 Prepared by: Mikey Kirschner  Exercises - Seated Overhead Press  - 1 x daily - 7 x weekly - 2 sets - 10 reps - Seated Bicep Curls Supinated with Dumbbells  - 1 x daily - 7 x weekly - 2 sets - 10 reps - Seated Long Arc Quad  - 1 x daily - 7 x weekly - 2 sets - 10 reps - Seated March  - 1 x daily - 7 x weekly - 1 sets - 20 reps - Sit to Stand with Armchair  - 1 x daily - 7 x weekly - 2 sets - 5 reps  ASSESSMENT:  CLINICAL IMPRESSION: Mrs. Strength arrived in much improved state.  She had less hesitation on start up and was able to do more challenging actiivties.  She continues to fatigue easily.  She was more talkative and interactive today.  Her chest pain is more controlled.  Vitals remained normal throughout session. Patient reported perceived exertion at 5/10.   She would benefit from skilled PT for upper and lower extremity strengthening along with balance and gait training to restore patient to her prior level of function.    OBJECTIVE IMPAIRMENTS: cardiopulmonary status limiting activity, decreased activity tolerance, decreased balance, decreased endurance, decreased knowledge of use of DME, decreased mobility, difficulty walking, decreased strength, and impaired UE functional use.   ACTIVITY LIMITATIONS: carrying, lifting, bending, standing, squatting, stairs, transfers, bed mobility, continence, bathing, toileting, dressing, reach over head, and hygiene/grooming  PARTICIPATION LIMITATIONS: meal prep, cleaning, laundry, driving, shopping, community activity, occupation, yard work, and church  PERSONAL FACTORS: Fitness are also affecting patient's functional outcome.   REHAB POTENTIAL: Good  CLINICAL DECISION MAKING: Stable/uncomplicated  EVALUATION COMPLEXITY: Low   GOALS: Goals reviewed with patient? Yes  SHORT TERM GOALS: Target  date: 09/22/2023  Patient to be independent with initial HEP Baseline: Goal status: INITIAL  2.  Patient to be able to prepare small meal for herself independently Baseline:  Goal status: INITIAL  3.  Patient to report increased appetite and improved nutrition Baseline:  Goal status: INITIAL   LONG TERM GOALS: Target date: 10/20/2023   Patient to be independent with advanced HEP  Baseline:  Goal status: INITIAL  2.  Patient to show improvement of at least 3 seconds on 5 times sit to stand test Baseline:  Goal status: INITIAL  3.  Patient to show improvement of at least 3 seconds on TUG test  Baseline:  Goal status: INITIAL  4.  Patient to demonstrate good heel to toe progression and no loss of balance for 300 feet Baseline:  Goal status: INITIAL  5.  Patient to be able to resume driving Baseline:  Goal status: INITIAL  6.  Patient to be able to resume work.   Baseline:  Goal status: INITIAL   PLAN:  PT FREQUENCY: 1-2x/week  PT DURATION: 8 weeks  PLANNED INTERVENTIONS: 97146- PT Re-evaluation, 97110-Therapeutic exercises, 97530- Therapeutic activity, 97112- Neuromuscular re-education, 97535- Self Care, 40981- Manual therapy, [phone]- Gait training, [phone]- Canalith repositioning, [phone]- Aquatic Therapy, Patient/Family education, Balance training, Stair training, Taping, Dry Needling, Joint mobilization, Spinal mobilization, Vestibular training, Visual/preceptual remediation/compensation, DME instructions, Cryotherapy, Moist heat, Therapeutic exercises, Therapeutic activity, Neuromuscular re-education, Gait training, and Self Care  PLAN FOR NEXT SESSION: Nustep (monitor O2 and HR) to tolerance up to 5 min, general lower and upper extremity strengthening.    Victorino Dike B. Clarice Bonaventure, PT 09/15/23 8:59 PM [facility] [address] [place], [place] [address] Phone # [phone] Fax [phone]

## 2023-09-19 ENCOUNTER — Telehealth: Payer: Self-pay

## 2023-09-19 NOTE — Telephone Encounter (Signed)
Patient was scheduled to see Dr. Arlana Pouch on 09/20/23. Tried to call patient mobile on 09/19/23 @ 8:36 am- mailbox full. Also, tried to call home on 09/19/23 @ 8:37 am- mailbox full.

## 2023-09-20 ENCOUNTER — Inpatient Hospital Stay: Payer: Medicare PPO | Admitting: Oncology

## 2023-09-20 ENCOUNTER — Ambulatory Visit: Payer: Medicare PPO | Attending: Family Medicine

## 2023-09-20 ENCOUNTER — Inpatient Hospital Stay: Payer: Medicare PPO

## 2023-09-20 DIAGNOSIS — R252 Cramp and spasm: Secondary | ICD-10-CM | POA: Diagnosis not present

## 2023-09-20 DIAGNOSIS — M6281 Muscle weakness (generalized): Secondary | ICD-10-CM | POA: Insufficient documentation

## 2023-09-20 DIAGNOSIS — R293 Abnormal posture: Secondary | ICD-10-CM | POA: Diagnosis not present

## 2023-09-20 DIAGNOSIS — R262 Difficulty in walking, not elsewhere classified: Secondary | ICD-10-CM | POA: Insufficient documentation

## 2023-09-20 DIAGNOSIS — R6889 Other general symptoms and signs: Secondary | ICD-10-CM | POA: Insufficient documentation

## 2023-09-20 NOTE — Therapy (Signed)
OUTPATIENT PHYSICAL THERAPY LOWER EXTREMITY TREATMENT   Patient Name: Jocelyn [name] MRN: [id number] DOB:1956-06-04, 67 y.o., female Today's Date: 09/20/2023  END OF SESSION:  PT End of Session - 09/20/23 1620     Visit Number 3    Date for PT Re-Evaluation 10/19/23    Authorization Type [employer]    PT Start Time 1617    PT Stop Time 1651    PT Time Calculation (min) 34 min    Activity Tolerance Patient limited by fatigue    Behavior During Therapy [facility] for tasks assessed/performed             Past Medical History:  Diagnosis Date   Acute bronchitis    Allergic rhinitis, cause unspecified    Anemia    Anxiety    B12 deficiency    BV (bacterial vaginosis) 06/22/1996   Calculus of kidney    Calculus of kidney    Coronary atherosclerosis of unspecified type of vessel, native or graft    Dizziness    Essential hypertension, benign    Fibroid 2003   Fibromyalgia    H/O dysmenorrhea 2008   H/O varicella    Headache(784.0)    frequently   HSV-2 infection 2009   Hyperplastic colon polyp 05/16/2014   Irritable bowel syndrome    Meniere's disease, unspecified    Menses, irregular 2003   Myalgia and myositis, unspecified    Myocardial infarction (HCC)    Obstructive sleep apnea (adult) (pediatric)    Perimenopausal symptoms 2003   Pure hypercholesterolemia    Sarcoidosis    Type II or unspecified type diabetes mellitus without mention of complication, not stated as uncontrolled    Unspecified venous (peripheral) insufficiency    Vitamin D deficiency    Vulvitis 2010   Yeast infection    Past Surgical History:  Procedure Laterality Date   COLONOSCOPY     CORONARY STENT INTERVENTION N/A 06/15/2017   Procedure: CORONARY STENT INTERVENTION;  Surgeon: Corky Crafts, MD;  Location: [facility];  Service: Cardiovascular;  Laterality: N/A;   heart catherization     IR ANGIOGRAM PULMONARY BILATERAL SELECTIVE  08/04/2023   IR ANGIOGRAM SELECTIVE EACH  ADDITIONAL VESSEL  08/04/2023   IR ANGIOGRAM SELECTIVE EACH ADDITIONAL VESSEL  08/04/2023   IR THROMBECT PRIM MECH INIT (INCLU) MOD SED  08/04/2023   IR US GUIDE VASC ACCESS RIGHT  08/04/2023   LEFT HEART CATH AND CORONARY ANGIOGRAPHY N/A 06/15/2017   Procedure: LEFT HEART CATH AND CORONARY ANGIOGRAPHY;  Surgeon: Corky Crafts, MD;  [facility]: [facility];  Service: Cardiovascular;  Laterality: N/A;   RADIOLOGY WITH ANESTHESIA N/A 08/04/2023   Procedure: IR WITH ANESTHESIA;  Surgeon: Julieanne Cotton, MD;  Location: [facility];  Service: Radiology;  Laterality: N/A;   TONSILLECTOMY     Patient Active Problem List   Diagnosis Date Noted   Physical deconditioning 08/23/2023   Acute respiratory failure with hypoxia (HCC) 08/05/2023   Pulmonary embolism (HCC) 08/03/2023   Pulmonary edema 08/03/2023   Degenerative arthritis of knee 08/20/2021   Hx of pleural effusion    Dyspnea 08/16/2018   Fall on or from sidewalk curb, initial encounter 05/31/2018   History of acute inferior wall MI 08/22/2017   Diabetes mellitus (HCC)    S/P right coronary artery (RCA) stent placement    Overweight 05/05/2017   Diabetes mellitus type 2, controlled, without complications (HCC) 12/16/2016   Asthmatic bronchitis , chronic (HCC) 05/10/2016   Polymyositis (HCC)  09/15/2015   History of sarcoidosis 09/15/2015   Falling 03/13/2015   Edema 02/13/2014   Coronary artery disease involving native heart without angina pectoris 10/29/2013   Hyperlipidemia 10/29/2013   Iron (Fe) deficiency anemia 08/08/2013   Vitamin B 12 deficiency 08/08/2013   GERD (gastroesophageal reflux disease) 09/08/2011   Weakness 02/22/2011   Depression 02/22/2011   MENIERE'S DISEASE 04/30/2009   Vitamin D deficiency 02/05/2009   Venous (peripheral) insufficiency 12/12/2007   RENAL CALCULUS 12/12/2007   DIZZINESS 12/12/2007   HYPERCHOLESTEROLEMIA 09/16/2007   Anxiety state 09/16/2007   HYPERTENSION, BENIGN 09/16/2007   Allergic  rhinitis 09/16/2007   IRRITABLE BOWEL SYNDROME 09/16/2007   Myalgia and myositis 09/16/2007    PCP: Renaye Rakers, MD   REFERRING PROVIDER: Jacklynn Ganong, FNP  REFERRING DIAG: [phone] (ICD-10-CM) - Physical deconditioning  THERAPY DIAG:  Difficulty in walking, not elsewhere classified  Muscle weakness (generalized)  Decreased activity tolerance  Cramp and spasm  Abnormal posture  Rationale for Evaluation and Treatment: Rehabilitation  ONSET DATE: 08/23/2023  SUBJECTIVE:   SUBJECTIVE STATEMENT: Patient arrives alone but daughter in car today working on her laptop.  She denies any issues other than feeling "stiff".    PERTINENT HISTORY: na PAIN:  Are you having pain? No  PRECAUTIONS: Other: RECENT PE, MONITOR O2 AND HR!  RED FLAGS: None   WEIGHT BEARING RESTRICTIONS: No  FALLS:  Has patient fallen in last 6 months? No  LIVING ENVIRONMENT: Lives with: lives alone Lives in: House/apartment Stairs: Yes: Internal: 12 steps; on right going up and External: 4 steps; on left going up Has following equipment at home: None  OCCUPATION: get details next visit  PLOF: Independent, Independent with basic ADLs, Independent with household mobility without device, Independent with community mobility without device, Independent with homemaking with ambulation, Independent with gait, and Independent with transfers  PATIENT GOALS: To be able to get back to things I enjoy doing: working part time, sorority events, doing everything independently, driving  NEXT MD VISIT: PRN  OBJECTIVE:  Note: Objective measures were completed at Evaluation unless otherwise noted.  DIAGNOSTIC FINDINGS: na  COGNITION: Overall cognitive status:  patient wasn't talking much and had flat affect, dtr spoke for patient most of the session      SENSATION: WFL   MUSCLE LENGTH: Generally good flexibility  POSTURE: rounded shoulders and forward head  PALPATION: na  LOWER EXTREMITY  ROM:  WNL  LOWER EXTREMITY MMT:  Extremely weak all over: inconclusive due to patient effort- she wasn't feeling well Generally 3 to 3+/5 upper and lower extremities   FUNCTIONAL TESTS: Target HR 122,  Max HR 152 5 times sit to stand: 33.91 With use of hands with O2 sat at no lower than 95% and HR at max 114 Timed up and go (TUG): 19.62 with O2 drop to no lower than 97% and HR max at 111   GAIT: Distance walked: 30 feet Assistive device utilized: None Level of assistance: SBA Comments: slow, low energy   TODAY'S TREATMENT:  DATE: 09/20/23 Nustep x 5 min level 2 (PT present to discuss status) Seated LAQ with 2.5 lbs 2 x 10 each LE (less verbal cues for full extension today) Seated march x 20 with 2.5 lbs Sit to stand 2 x 5 without UE support with 5 lb kb  Seated bicep curls with 1 lb 2 x 10 Seated overhead press with 1 lb 2 x 10 Seated clam 2 x 10 with yellow loop Supine SLR 2 x 5 each LE Supine bridge 2 x 5 Hooklying march x 20 Lower trunk rotation in hooklying x 20 (10 each side)  4" step up x 10 each LE at counter At counter with 6 inch step: alternating tap ups with single UE support  DATE: 09/15/23 Nustep x 6 min level 2 (PT present to discuss status) Seated LAQ with 2.5 lbs 2 x 10 each LE (verbal cues for full extension) Seated march x 20 with 2.5 lbs Sit to stand 2 x 5 without UE support Pulse ox measurement: patient remained within 97-99 O2 and 74-77 HR At counter with 6 inch step: alternating tap ups with single UE support 4" step up x 10 each LE at counter  Pulse ox measurement: patient remained within 97-99 O2 and 74-77 HR Seated clam 2 x 10 with yellow loop  Seated bicep curls with 1 lb 2 x 10 Seated overhead press with 1 lb 2 x 10   DATE: 08/24/23 Initial eval completed and initiated HEP    PATIENT EDUCATION:  Education details:  initiated HEP and educated on how to determine target heart rate and max heart rate Person educated: Patient and Child(ren) Education method: Programmer, multimedia, Facilities manager, Verbal cues, and Handouts Education comprehension: verbalized understanding, returned demonstration, and verbal cues required  HOME EXERCISE PROGRAM: Access Code: [email or web address] URL: [email or web address] Date: 08/24/2023 Prepared by: Mikey Kirschner  Exercises - Seated Overhead Press  - 1 x daily - 7 x weekly - 2 sets - 10 reps - Seated Bicep Curls Supinated with Dumbbells  - 1 x daily - 7 x weekly - 2 sets - 10 reps - Seated Long Arc Quad  - 1 x daily - 7 x weekly - 2 sets - 10 reps - Seated March  - 1 x daily - 7 x weekly - 1 sets - 20 reps - Sit to Stand with Armchair  - 1 x daily - 7 x weekly - 2 sets - 5 reps  ASSESSMENT:  CLINICAL IMPRESSION: Mrs. Basquez was able to tolerate addition of several exercises.  She had some mild discomfort in her lumbar spine with sit to stand but this improved as she continued with more exercises.  Hip flexors are fairly weak.  She had perceived exertion of 5/10 at end of session.  She is more talkative and seems to be more motivated.  She is compliant with her HEP and states that her dtr and son-in-law keep tabs on her doing her exercises.  She would benefit from skilled PT for upper and lower extremity strengthening along with balance and gait training to restore patient to her prior level of function.    OBJECTIVE IMPAIRMENTS: cardiopulmonary status limiting activity, decreased activity tolerance, decreased balance, decreased endurance, decreased knowledge of use of DME, decreased mobility, difficulty walking, decreased strength, and impaired UE functional use.   ACTIVITY LIMITATIONS: carrying, lifting, bending, standing, squatting, stairs, transfers, bed mobility, continence, bathing, toileting, dressing, reach over head, and hygiene/grooming  PARTICIPATION LIMITATIONS: meal  prep, cleaning, laundry, driving, shopping, community activity, occupation,  yard work, and church  PERSONAL FACTORS: Fitness are also affecting patient's functional outcome.   REHAB POTENTIAL: Good  CLINICAL DECISION MAKING: Stable/uncomplicated  EVALUATION COMPLEXITY: Low   GOALS: Goals reviewed with patient? Yes  SHORT TERM GOALS: Target date: 09/22/2023  Patient to be independent with initial HEP Baseline: Goal status: MET 09/20/23  2.  Patient to be able to prepare small meal for herself independently Baseline:  Goal status: In progress 09/20/23  3.  Patient to report increased appetite and improved nutrition Baseline:  Goal status: INITIAL   LONG TERM GOALS: Target date: 10/20/2023   Patient to be independent with advanced HEP  Baseline:  Goal status: INITIAL  2.  Patient to show improvement of at least 3 seconds on 5 times sit to stand test Baseline:  Goal status: INITIAL  3.  Patient to show improvement of at least 3 seconds on TUG test Baseline:  Goal status: INITIAL  4.  Patient to demonstrate good heel to toe progression and no loss of balance for 300 feet Baseline:  Goal status: INITIAL  5.  Patient to be able to resume driving Baseline:  Goal status: INITIAL  6.  Patient to be able to resume work.   Baseline:  Goal status: INITIAL   PLAN:  PT FREQUENCY: 1-2x/week  PT DURATION: 8 weeks  PLANNED INTERVENTIONS: 97146- PT Re-evaluation, 97110-Therapeutic exercises, 97530- Therapeutic activity, 97112- Neuromuscular re-education, 97535- Self Care, 16109- Manual therapy, [phone]- Gait training, [phone]- Canalith repositioning, [phone]- Aquatic Therapy, Patient/Family education, Balance training, Stair training, Taping, Dry Needling, Joint mobilization, Spinal mobilization, Vestibular training, Visual/preceptual remediation/compensation, DME instructions, Cryotherapy, Moist heat, Therapeutic exercises, Therapeutic activity, Neuromuscular re-education, Gait  training, and Self Care  PLAN FOR NEXT SESSION: Nustep, general lower and upper extremity strengthening, more functional strengthening and possibly 6 min walk test.    Victorino Dike B. Treyvone Chelf, PT 09/20/23 5:02 PM [facility] [address] [place], [place] [address] Phone # [phone] Fax [phone]

## 2023-09-21 ENCOUNTER — Telehealth: Payer: Self-pay | Admitting: Primary Care

## 2023-09-21 ENCOUNTER — Telehealth (HOSPITAL_COMMUNITY): Payer: Self-pay | Admitting: Licensed Clinical Social Worker

## 2023-09-21 ENCOUNTER — Encounter: Payer: Self-pay | Admitting: Primary Care

## 2023-09-21 ENCOUNTER — Ambulatory Visit: Payer: Medicare PPO | Admitting: Primary Care

## 2023-09-21 VITALS — BP 124/68 | HR 70 | Temp 98.0°F | Ht 62.0 in | Wt 147.2 lb

## 2023-09-21 DIAGNOSIS — R569 Unspecified convulsions: Secondary | ICD-10-CM

## 2023-09-21 DIAGNOSIS — I2699 Other pulmonary embolism without acute cor pulmonale: Secondary | ICD-10-CM | POA: Diagnosis not present

## 2023-09-21 DIAGNOSIS — J9611 Chronic respiratory failure with hypoxia: Secondary | ICD-10-CM | POA: Diagnosis not present

## 2023-09-21 DIAGNOSIS — D329 Benign neoplasm of meninges, unspecified: Secondary | ICD-10-CM

## 2023-09-21 DIAGNOSIS — G4730 Sleep apnea, unspecified: Secondary | ICD-10-CM | POA: Diagnosis not present

## 2023-09-21 MED ORDER — BENZONATATE 100 MG PO CAPS: 100.0000 mg | ORAL_CAPSULE | Freq: Three times a day (TID) | ORAL | 1 refills | Status: DC | PRN

## 2023-09-21 NOTE — Patient Instructions (Addendum)
No driving 6 months due to possible seizure while in-patient  Repeat imaging in approx 12 mo for meningioma (primary care can order this) Check with cardiology if she should be on a baby aspirin  Echocardiogram has been ordered, planning for beginning of January then follow up with Dr. Shirlee Latch  Continue Eliquis 5mg  twice daily - monitor for bleeding  Take tessalon perle every 8 hours as needed for cough   Orders: Ambulatory O2 check   Refer: Pulmonary rehab re: pulmonary embolism/ provider will be Dr. Marchelle Gearing   Follow-up: Reschedule apt with Dr. Karie Soda (mid December-January)

## 2023-09-21 NOTE — Telephone Encounter (Signed)
HF Paramedicine Team Based Care Meeting  One Month Post Enrollment Assessment  HF MD- NA  HF NP - Amy Clegg NP-C   [facility] HF Paramedicine  [name] Vicente Males  [facility] admit within the last 30 days for heart failure? no  Medications concerns? Compliant and good comprehension- daughter filling pill box at this time.  SDOH concerns? Living with daughter and family friend still hopeful to be moving back home with more safety features soon.  Barriers to discharge? Still new referral and building strength- would want to follow once she returns home to make sure she remains successful- dtr also moving back to [place] soon so unsure how pt will manage medications herself- will work on this.   Burna Sis, LCSW Clinical Social Worker [facility] Desk#: [phone] Cell#: [phone]

## 2023-09-21 NOTE — Progress Notes (Signed)
@Patient  ID: Jocelyn [name], female    DOB: 12-06-1955, 67 y.o.   MRN: [id number]  Chief Complaint  Patient presents with   Follow-up    Referring provider: Renaye Rakers, MD  HPI: 67 year old female, never smoked. PMH significant for HTN, CAD, mild OSA, Covid-19, asthmatic bronchitis, allergic rhinitis, GERD, IBS, type 2 DM. Patient of Dr. Everardo All, last seen on 06/13/19.  Previous [facility] pulmonary encounter: 06/13/19- Dr. Everardo All  Ms. Cyann Venti is a 66 year old female with chronic bronchitis, hx of sarcoid diagnosed via bronchoscopy in 1980s with recurrence, OSA, HTN, CAD s/p DES to RCA in 2019, chronic diastolic heart failure and polymyositis previously on methotrexate and anxiety who presents for follow-up of shortness of breath.   Former Dr. Kriste Basque patient: Followed for annual episodes of acute bronchitis treated with antibiotics, inhalers and steroids. She has chronic dyspnea on exertion and is sedentary at baseline. Of note, during hospitalization 08/2019 found with left paramediastinal mass. Repeat imaging demonstrated complete resolution after initiating bronchodilator therapy so this abnormality likely represented atelectasis.   Since our last visit in 12/2018, her bronchodilator was changed to Advair. She does not feel any different, no worse or better. She reports her shortness of breath is minimal but states she limits her activity so not sure if she is doing well. Also ordered sleep study however not completed due to pandemic. Has not had to use her albuterol. She continues to report excessive daytime sleepiness and fatigue. Denies falling asleep when talking to people however will sleep easily if she is left alone and/or laying down. Her sleep hours are irregular and does not have a routine bed ritual. Considering returning to her part time job for limited hours at [employer].    [place]: 9  04/22/2021 Patient tested positive for covid on 6/3. She had symptoms of chills, HA, chest  congested, dry cough and diarrhea started on Thursday. Sent in paxlovid.  No significant respiratory complaints. Cough and sore throat have improved. Very mild dyspnea when she exerts herself. Diarrhea has slowed down some. She is having 4 loose stools a day, previously she was having diarrhea every hour. She is taking imodium as needed. She went to [facility] today for covid testing. She was told her heart rate was 51. She is having no vision changes or lightheadedness.   Mild sleep apnea: - She never had HST that was ordered back in 2020 d/t insurance coverage/cost  - Denies overt apnea symptoms     09/21/2023 Patient presents today for overdue follow-up. She is followed by our office for history of chronic bronchitis and mild OSA. Last seen in 2022 for Covid infection. Did not have repeat HST due to insurance coverage.   Discussed the use of AI scribe software for clinical note transcription with the patient, who gave verbal consent to proceed.  History of Present Illness   The patient, with a history of chronic bronchitis, mild sleep apnea, and polymyositis, presents for a follow-up visit after a recent hospitalization due to a pulmonary embolism. The patient experienced a cardiac arrest during the hospital stay and was successfully resuscitated. The patient reports confusion about the events surrounding the hospitalization and the subsequent follow-up care.  The patient's sleep apnea symptoms are reportedly minimal, with the patient waking up to use the bathroom at night but able to fall back asleep. The patient denies snoring or waking up gasping or choking. The patient is not currently interested in re-evaluating her sleep apnea with another sleep  study.  Regarding the patient's chronic bronchitis, the patient was trialed off of the inhaler, Advair, about four years ago. The patient reports shortness of breath with exertion, which has been a persistent symptom since the hospitalization. The  patient denies any bleeding while on Eliquis, a blood thinner prescribed after the pulmonary embolism.  The patient also reports voice hoarseness since being intubated during the hospital stay. The patient's voice quality varies, sometimes being louder than other times. The patient has not seen an ear, nose, and throat doctor for this issue.  The patient is also on Imuran for polymyositis and is followed by a rheumatologist for this condition. The patient reports no changes in this condition.       Allergies  Allergen Reactions   Actos [Pioglitazone] Other (See Comments)    REACTION: pt states INTOL w/ edema   Codeine Other (See Comments)    REACTION: vomiting   Januvia [Sitagliptin] Nausea Only   Lactose Intolerance (Gi) Diarrhea   Morphine Nausea Only and Nausea And Vomiting    REACTION: vomiting REACTION: vomiting   Repatha [Evolocumab]     WAS NOT EFFECTIVE IN LOWERING LDL    Immunization History  Administered Date(s) Administered   Influenza Split 08/16/2011, 08/16/2012, 08/16/2013, 09/04/2014   Influenza Whole 09/16/2009, 08/15/2010   Influenza,inj,Quad PF,6+ Mos 08/30/2016, 06/16/2017, 10/23/2018, 09/14/2019   Influenza-Unspecified 08/19/2015   PFIZER(Purple Top)SARS-COV-2 Vaccination 01/17/2020, 02/13/2020   Pneumococcal Polysaccharide-23 11/15/2004   Tdap 02/22/2011    Past Medical History:  Diagnosis Date   Acute bronchitis    Allergic rhinitis, cause unspecified    Anemia    Anxiety    B12 deficiency    BV (bacterial vaginosis) 06/22/1996   Calculus of kidney    Calculus of kidney    Coronary atherosclerosis of unspecified type of vessel, native or graft    Dizziness    Essential hypertension, benign    Fibroid 2003   Fibromyalgia    H/O dysmenorrhea 2008   H/O varicella    Headache(784.0)    frequently   HSV-2 infection 2009   Hyperplastic colon polyp 05/16/2014   Irritable bowel syndrome    Meniere's disease, unspecified    Menses, irregular 2003    Myalgia and myositis, unspecified    Myocardial infarction (HCC)    Obstructive sleep apnea (adult) (pediatric)    Perimenopausal symptoms 2003   Pure hypercholesterolemia    Sarcoidosis    Type II or unspecified type diabetes mellitus without mention of complication, not stated as uncontrolled    Unspecified venous (peripheral) insufficiency    Vitamin D deficiency    Vulvitis 2010   Yeast infection     Tobacco History: Social History   Tobacco Use  Smoking Status Never  Smokeless Tobacco Never  Tobacco Comments   Daily Caffeine - 1  Exercise 2-3 times/weekly   Counseling given: Not Answered Tobacco comments: Daily Caffeine - 1  Exercise 2-3 times/weekly   Outpatient Medications Prior to Visit  Medication Sig Dispense Refill   allopurinol (ZYLOPRIM) 100 MG tablet Take 1 tablet by mouth daily as needed.     ALPRAZolam (XANAX) 0.5 MG tablet Take 0.5 mg by mouth 3 (three) times daily.     apixaban (ELIQUIS) 5 MG TABS tablet Take 10 mg by mouth twice daily, then on 08/17/2023-switch to 5 mg by mouth twice daily. 180 tablet 3   azaTHIOprine (IMURAN) 50 MG tablet Take 1 tablet by mouth daily.     Empagliflozin-metFORMIN HCl ER 25-1000 MG TB24  Take 1 tablet by mouth daily.     GEMTESA 75 MG TABS Take 1 tablet by mouth daily.     icosapent Ethyl (VASCEPA) 1 g capsule TAKE 2 CAPSULES(2 GRAMS) BY MOUTH TWICE DAILY 360 capsule 3   metoprolol succinate (TOPROL-XL) 100 MG 24 hr tablet Take 1 tablet (100 mg total) by mouth daily. Take with or immediately following a meal. 90 tablet 3   NEXLETOL 180 MG TABS TAKE 1 TABLET BY MOUTH DAILY 90 tablet 1   nitroGLYCERIN (NITROSTAT) 0.4 MG SL tablet Place 1 tablet (0.4 mg total) under the tongue every 5 (five) minutes as needed for chest pain. 25 tablet 2   ONETOUCH VERIO test strip CHECK BLOOD SUGAR TID AS DIRECTED     pantoprazole (PROTONIX) 40 MG tablet Take 1 tablet (40 mg total) by mouth daily. 30 tablet 0   PRALUENT 150 MG/ML SOAJ ADMINISTER 1  ML UNDER THE SKIN EVERY 14 DAYS 6 mL 3   spironolactone (ALDACTONE) 25 MG tablet TAKE 1 TABLET BY MOUTH EVERY DAY 90 tablet 2   No facility-administered medications prior to visit.      Review of Systems  Review of Systems   Physical Exam  BP 124/68 (BP Location: Left Arm, Patient Position: Sitting, Cuff Size: Normal)   Pulse 70   Temp 98 F (36.7 C) (Oral)   Ht 5\' 2"  (1.575 m)   Wt 147 lb 3.2 oz (66.8 kg)   SpO2 96%   BMI 26.92 kg/m  Physical Exam Constitutional:      Appearance: Normal appearance.  HENT:     Mouth/Throat:     Mouth: Mucous membranes are moist.     Pharynx: Oropharynx is clear.  Cardiovascular:     Rate and Rhythm: Normal rate and regular rhythm.  Pulmonary:     Effort: Pulmonary effort is normal.     Breath sounds: Normal breath sounds. No wheezing or rhonchi.     Comments: O2 96% RA at rest (dropped 84% with exertion) Skin:    General: Skin is warm and dry.  Neurological:     General: No focal deficit present.     Mental Status: She is alert and oriented to person, place, and time. Mental status is at baseline.  Psychiatric:        Behavior: Behavior normal.        Judgment: Judgment normal.      Lab Results:  CBC    Component Value Date/Time   WBC 8.3 08/09/2023 0838   RBC 2.38 (L) 08/09/2023 0838   HGB 8.1 (L) 08/09/2023 0838   HCT 25.8 (L) 08/09/2023 0838   PLT 150 08/09/2023 0838   MCV 108.4 (H) 08/09/2023 0838   MCH 34.0 08/09/2023 0838   MCHC 31.4 08/09/2023 0838   RDW 17.0 (H) 08/09/2023 0838   LYMPHSABS 1.3 08/03/2023 1954   MONOABS 0.6 08/03/2023 1954   EOSABS 0.0 08/03/2023 1954   BASOSABS 0.0 08/03/2023 1954    BMET    Component Value Date/Time   NA 142 08/10/2023 0346   NA 146 (H) 11/06/2019 1519   K 3.7 08/10/2023 0346   CL 104 08/10/2023 0346   CO2 32 08/10/2023 0346   GLUCOSE 79 08/10/2023 0346   BUN 11 08/10/2023 0346   BUN 13 11/06/2019 1519   CREATININE 1.13 (H) 08/10/2023 0346   CREATININE 0.83  06/01/2016 1254   CALCIUM 8.2 (L) 08/10/2023 0346   GFRNONAA 53 (L) 08/10/2023 0346   GFRAA 105 11/06/2019 1519  BNP    Component Value Date/Time   BNP 374.2 (H) 08/08/2023 0345   BNP 8.0 03/26/2016 1609    ProBNP    Component Value Date/Time   PROBNP 36.0 08/16/2018 1350    Imaging: No results found.   Assessment & Plan:   1. Meningioma [facility]) - Ambulatory referral to Neurology  2. Seizure [facility]) - Ambulatory referral to Neurology  3. Chronic respiratory failure with hypoxia (HCC) - AMB REFERRAL FOR DME  4. Pulmonary embolism, unspecified chronicity, unspecified pulmonary embolism type, unspecified whether acute cor pulmonale present (HCC)  5. Sleep apnea, unspecified type      Pulmonary Embolism Recent massive pulmonary embolism with cardiac arrest. Currently on Eliquis 5mg  twice daily. No signs of bleeding. -Continue Eliquis 5mg  twice daily. -Clarify with cardiology regarding the need for baby aspirin. -Repeat echocardiogram in 3 months as per cardiology's recommendation.  Sleep Apnea History of mild sleep apnea from 2006. No current symptoms of sleep apnea. -Continue monitoring for symptoms of sleep apnea. If symptoms develop, consider repeat sleep study.  Chronic Bronchitis History of chronic bronchitis. Currently off Advair. Reports shortness of breath with exertion. -Consider pulmonary rehabilitation.  Polymyositis Managed by rheumatology. Currently on Imuran and medrol. -Continue current management with rheumatology.  Meningioma Small meningioma identified on MRI, felt to be incidental  -Repeat imaging in 12 months as per previous recommendation. -Refer to neurology   Seizure Patient had a seizure while admitted in September for PE, it was felt that the seizure was provoked. EEG was negative. It was not recommended patient be started on AEDS. Dr. Amada Jupiter with neurology did think driving prohibitions were needed and recommended  patient not  drive for 6 months, this was new news to the patient and made her very upset. Referred to neurology for second opinion   Voice Hoarseness Voice hoarseness post-intubation. -Monitor for improvement. If persists, consider evaluation by an ear, nose, and throat specialist.  Respiratory failure with hypoxia  - Secondary to large PE with heart strain  - Walked in office today on forehead probe, O2 drop 84%. Recovered  to 94% with 2L supplement oxygen  - DME order send for patient to be provided POC and home oxygen concentrator with [employer]. She needs 2L with exertion and at night - Hopefully we can wean off oxygen at follow-up visit in December-January - Refer to pulmonary rehab  FU with Dr. Marchelle Gearing in late December-January   Glenford Bayley, NP 09/21/2023

## 2023-09-21 NOTE — Telephone Encounter (Signed)
Per my AVS I recommend patient visit with Dr. Marchelle Gearing on 11/19 be rescheduled to December or January. Can we follow up on this

## 2023-09-23 ENCOUNTER — Telehealth (HOSPITAL_COMMUNITY): Payer: Self-pay

## 2023-09-23 NOTE — Telephone Encounter (Signed)
Attempted to call patient to review recommendation per [facility] regarding patient visit with Dr. Marchelle Gearing on 11/19 be rescheduled to December or January. Both home and mobile number says "voice mailbox is full and cannot receive messages".  Will send MyChart message and close encounter.

## 2023-09-23 NOTE — Telephone Encounter (Signed)
Pt insurance is active and benefits verified through [facility] Co-pay $20, DED 0/0 met, out of pocket $4,000/$2,038.13 met, co-insurance 0%. no pre-authorization required, [employer] 09/23/2023@4 :20, REF# [id number]

## 2023-09-26 ENCOUNTER — Telehealth: Payer: Self-pay | Admitting: Primary Care

## 2023-09-26 NOTE — Telephone Encounter (Signed)
Dr. Marchelle Gearing does not have any openings for December or January. Should patient keep the appointment for 10/04/2023. Patient phone number is [phone].

## 2023-09-26 NOTE — Telephone Encounter (Signed)
Called and spoke to patient. She stated that she would like to keep scheduled appt 10/04/2023, as she has additional questions.  BW recommended following up with MR Dec-mid Jan, however MR does not have any availability within that time.   MR, would you like pt to keep scheduled appt for 10/04/2023 or squeeze her in Dec-Jan?

## 2023-09-27 DIAGNOSIS — I10 Essential (primary) hypertension: Secondary | ICD-10-CM | POA: Diagnosis not present

## 2023-09-27 DIAGNOSIS — G4700 Insomnia, unspecified: Secondary | ICD-10-CM | POA: Diagnosis not present

## 2023-09-27 DIAGNOSIS — I2601 Septic pulmonary embolism with acute cor pulmonale: Secondary | ICD-10-CM | POA: Diagnosis not present

## 2023-09-27 DIAGNOSIS — E782 Mixed hyperlipidemia: Secondary | ICD-10-CM | POA: Diagnosis not present

## 2023-09-27 DIAGNOSIS — E1169 Type 2 diabetes mellitus with other specified complication: Secondary | ICD-10-CM | POA: Diagnosis not present

## 2023-09-27 DIAGNOSIS — F064 Anxiety disorder due to known physiological condition: Secondary | ICD-10-CM | POA: Diagnosis not present

## 2023-09-27 DIAGNOSIS — I2692 Saddle embolus of pulmonary artery without acute cor pulmonale: Secondary | ICD-10-CM | POA: Diagnosis not present

## 2023-09-27 NOTE — Telephone Encounter (Signed)
I will follow patient wishes  - see her 11/19 and go from there

## 2023-09-27 NOTE — Telephone Encounter (Signed)
Called and spoke with patient, provided response per Dr. Marchelle Gearing.  She verbalized understanding.  Nothing further needed.

## 2023-09-28 ENCOUNTER — Ambulatory Visit: Payer: Medicare PPO

## 2023-09-28 ENCOUNTER — Other Ambulatory Visit (HOSPITAL_COMMUNITY): Payer: Self-pay

## 2023-09-28 ENCOUNTER — Telehealth (HOSPITAL_COMMUNITY): Payer: Self-pay | Admitting: Cardiology

## 2023-09-28 DIAGNOSIS — R252 Cramp and spasm: Secondary | ICD-10-CM

## 2023-09-28 DIAGNOSIS — R293 Abnormal posture: Secondary | ICD-10-CM | POA: Diagnosis not present

## 2023-09-28 DIAGNOSIS — R262 Difficulty in walking, not elsewhere classified: Secondary | ICD-10-CM | POA: Diagnosis not present

## 2023-09-28 DIAGNOSIS — M6281 Muscle weakness (generalized): Secondary | ICD-10-CM

## 2023-09-28 DIAGNOSIS — R6889 Other general symptoms and signs: Secondary | ICD-10-CM

## 2023-09-28 NOTE — Therapy (Signed)
OUTPATIENT PHYSICAL THERAPY LOWER EXTREMITY TREATMENT   Patient Name: Jocelyn [name] MRN: [id number] DOB:08-Mar-1956, 67 y.o., female Today's Date: 09/28/2023  END OF SESSION:  PT End of Session - 09/28/23 1500     Visit Number 4    Date for PT Re-Evaluation 10/19/23    Authorization Type [employer]    PT Start Time 1448    PT Stop Time 1538    PT Time Calculation (min) 50 min    Activity Tolerance Patient tolerated treatment well    Behavior During Therapy WFL for tasks assessed/performed             Past Medical History:  Diagnosis Date   Acute bronchitis    Allergic rhinitis, cause unspecified    Anemia    Anxiety    B12 deficiency    BV (bacterial vaginosis) 06/22/1996   Calculus of kidney    Calculus of kidney    Coronary atherosclerosis of unspecified type of vessel, native or graft    Dizziness    Essential hypertension, benign    Fibroid 2003   Fibromyalgia    H/O dysmenorrhea 2008   H/O varicella    Headache(784.0)    frequently   HSV-2 infection 2009   Hyperplastic colon polyp 05/16/2014   Irritable bowel syndrome    Meniere's disease, unspecified    Menses, irregular 2003   Myalgia and myositis, unspecified    Myocardial infarction (HCC)    Obstructive sleep apnea (adult) (pediatric)    Perimenopausal symptoms 2003   Pure hypercholesterolemia    Sarcoidosis    Type II or unspecified type diabetes mellitus without mention of complication, not stated as uncontrolled    Unspecified venous (peripheral) insufficiency    Vitamin D deficiency    Vulvitis 2010   Yeast infection    Past Surgical History:  Procedure Laterality Date   COLONOSCOPY     CORONARY STENT INTERVENTION N/A 06/15/2017   Procedure: CORONARY STENT INTERVENTION;  Surgeon: Corky Crafts, MD;  Location: [facility];  Service: Cardiovascular;  Laterality: N/A;   heart catherization     IR ANGIOGRAM PULMONARY BILATERAL SELECTIVE  08/04/2023   IR ANGIOGRAM SELECTIVE  EACH ADDITIONAL VESSEL  08/04/2023   IR ANGIOGRAM SELECTIVE EACH ADDITIONAL VESSEL  08/04/2023   IR THROMBECT PRIM MECH INIT (INCLU) MOD SED  08/04/2023   IR US GUIDE VASC ACCESS RIGHT  08/04/2023   LEFT HEART CATH AND CORONARY ANGIOGRAPHY N/A 06/15/2017   Procedure: LEFT HEART CATH AND CORONARY ANGIOGRAPHY;  Surgeon: Corky Crafts, MD;  Location: [facility];  Service: Cardiovascular;  Laterality: N/A;   RADIOLOGY WITH ANESTHESIA N/A 08/04/2023   Procedure: IR WITH ANESTHESIA;  Surgeon: Julieanne Cotton, MD;  Location: [facility];  Service: Radiology;  Laterality: N/A;   TONSILLECTOMY     Patient Active Problem List   Diagnosis Date Noted   Physical deconditioning 08/23/2023   Acute respiratory failure with hypoxia (HCC) 08/05/2023   Pulmonary embolism (HCC) 08/03/2023   Pulmonary edema 08/03/2023   Degenerative arthritis of knee 08/20/2021   Hx of pleural effusion    Dyspnea 08/16/2018   Fall on or from sidewalk curb, initial encounter 05/31/2018   History of acute inferior wall MI 08/22/2017   Diabetes mellitus (HCC)    S/P right coronary artery (RCA) stent placement    Overweight 05/05/2017   Diabetes mellitus type 2, controlled, without complications (HCC) 12/16/2016   Asthmatic bronchitis , chronic (HCC) 05/10/2016   Polymyositis (HCC)  09/15/2015   History of sarcoidosis 09/15/2015   Falling 03/13/2015   Edema 02/13/2014   Coronary artery disease involving native heart without angina pectoris 10/29/2013   Hyperlipidemia 10/29/2013   Iron (Fe) deficiency anemia 08/08/2013   Vitamin B 12 deficiency 08/08/2013   GERD (gastroesophageal reflux disease) 09/08/2011   Weakness 02/22/2011   Depression 02/22/2011   MENIERE'S DISEASE 04/30/2009   Vitamin D deficiency 02/05/2009   Venous (peripheral) insufficiency 12/12/2007   RENAL CALCULUS 12/12/2007   DIZZINESS 12/12/2007   HYPERCHOLESTEROLEMIA 09/16/2007   Anxiety state 09/16/2007   HYPERTENSION, BENIGN 09/16/2007    Allergic rhinitis 09/16/2007   IRRITABLE BOWEL SYNDROME 09/16/2007   Myalgia and myositis 09/16/2007    PCP: Renaye Rakers, MD   REFERRING PROVIDER: Jacklynn Ganong, FNP  REFERRING DIAG: [phone] (ICD-10-CM) - Physical deconditioning  THERAPY DIAG:  Difficulty in walking, not elsewhere classified  Muscle weakness (generalized)  Decreased activity tolerance  Cramp and spasm  Abnormal posture  Rationale for Evaluation and Treatment: Rehabilitation  ONSET DATE: 08/23/2023  SUBJECTIVE:   SUBJECTIVE STATEMENT: Patient arrives with daughter.  No complaints.  Feeling good today.   "A little bit sore after last visit"    PERTINENT HISTORY: na PAIN:  Are you having pain? No  PRECAUTIONS: Other: RECENT PE, MONITOR O2 AND HR!  MD has requested BP monitoring as patient was low at her last 2 visits:  09/28/23: BP 150/89 HR: 69  RED FLAGS: None   WEIGHT BEARING RESTRICTIONS: No  FALLS:  Has patient fallen in last 6 months? No  LIVING ENVIRONMENT: Lives with: lives alone Lives in: House/apartment Stairs: Yes: Internal: 12 steps; on right going up and External: 4 steps; on left going up Has following equipment at home: None  OCCUPATION: get details next visit  PLOF: Independent, Independent with basic ADLs, Independent with household mobility without device, Independent with community mobility without device, Independent with homemaking with ambulation, Independent with gait, and Independent with transfers  PATIENT GOALS: To be able to get back to things I enjoy doing: working part time, sorority events, doing everything independently, driving  NEXT MD VISIT: PRN  OBJECTIVE:  Note: Objective measures were completed at Evaluation unless otherwise noted.  DIAGNOSTIC FINDINGS: na  COGNITION: Overall cognitive status:  patient wasn't talking much and had flat affect, dtr spoke for patient most of the session      SENSATION: WFL   MUSCLE LENGTH: Generally good  flexibility  POSTURE: rounded shoulders and forward head  PALPATION: na  LOWER EXTREMITY ROM:  WNL  LOWER EXTREMITY MMT:  Extremely weak all over: inconclusive due to patient effort- she wasn't feeling well Generally 3 to 3+/5 upper and lower extremities   FUNCTIONAL TESTS: Target HR 122,  Max HR 152 5 times sit to stand: 33.91 With use of hands with O2 sat at no lower than 95% and HR at max 114 Timed up and go (TUG): 19.62 with O2 drop to no lower than 97% and HR max at 111   GAIT: Distance walked: 30 feet Assistive device utilized: None Level of assistance: SBA Comments: slow, low energy   TODAY'S TREATMENT:  DATE: 09/28/23 Nustep x 5 min level 2 (PT present to discuss status) Sit to stand 2 x 5 without UE support with 5 lb kb Seated LAQ with 3 lbs 2 x 10 each LE  Seated march x 20 with 3 lbs Seated hip ER with 3 lbs 2 x 10 each LE Seated bicep curls with 1 lb x 20 Seated overhead press with 1 lb 2 x 10 Seated clam x20 with yellow loop Standing hip extension 0# 2 x 10 Standing hip abduciton 0# 2 x 10 Standing alternating tap ups on 6" step x 20 Standing lateral band walks with blue loop x 3 laps at barre Perceived exertion 5/10 Pulse/ox reading: immediately post standing exercises: 100% with a drop no lower than 96% for 2 min time period.  HR no higher than 100.    DATE: 09/20/23 Nustep x 5 min level 2 (PT present to discuss status) Seated LAQ with 2.5 lbs 2 x 10 each LE (less verbal cues for full extension today) Seated march x 20 with 2.5 lbs Sit to stand 2 x 5 without UE support with 5 lb kb  Seated bicep curls with 1 lb 2 x 10 Seated overhead press with 1 lb 2 x 10 Seated clam 2 x 10 with yellow loop Supine SLR 2 x 5 each LE Supine bridge 2 x 5 Hooklying march x 20 Lower trunk rotation in hooklying x 20 (10 each side)  4" step up x  10 each LE at counter At counter with 6 inch step: alternating tap ups with single UE support  DATE: 09/15/23 Nustep x 6 min level 2 (PT present to discuss status) Seated LAQ with 2.5 lbs 2 x 10 each LE (verbal cues for full extension) Seated march x 20 with 2.5 lbs Sit to stand 2 x 5 without UE support Pulse ox measurement: patient remained within 97-99 O2 and 74-77 HR At counter with 6 inch step: alternating tap ups with single UE support 4" step up x 10 each LE at counter  Pulse ox measurement: patient remained within 97-99 O2 and 74-77 HR Seated clam 2 x 10 with yellow loop  Seated bicep curls with 1 lb 2 x 10 Seated overhead press with 1 lb 2 x 10   DATE: 08/24/23 Initial eval completed and initiated HEP    PATIENT EDUCATION:  Education details: initiated HEP and educated on how to determine target heart rate and max heart rate Person educated: Patient and Child(ren) Education method: Programmer, multimedia, Facilities manager, Verbal cues, and Handouts Education comprehension: verbalized understanding, returned demonstration, and verbal cues required  HOME EXERCISE PROGRAM: Access Code: [email or web address] URL: [email or web address] Date: 08/24/2023 Prepared by: Mikey Kirschner  Exercises - Seated Overhead Press  - 1 x daily - 7 x weekly - 2 sets - 10 reps - Seated Bicep Curls Supinated with Dumbbells  - 1 x daily - 7 x weekly - 2 sets - 10 reps - Seated Long Arc Quad  - 1 x daily - 7 x weekly - 2 sets - 10 reps - Seated March  - 1 x daily - 7 x weekly - 1 sets - 20 reps - Sit to Stand with Armchair  - 1 x daily - 7 x weekly - 2 sets - 5 reps  ASSESSMENT:  CLINICAL IMPRESSION: Mrs. Sefton was slightly less energetic today.  Her dtr was in clinic today.  She had mentioned that MD wanted BP reading at PT today.  Patient forgot to let PT know before  we started Nustep.  Had patient stop Nustep and rest before taking BP.  BP was 150/89 and HR was 69.  She was able to tolerate more  standing exercises today with min rest breaks.  Hip flexors remain weak.  She had perceived exertion of 5/10 at end of session.    She would benefit from skilled PT for upper and lower extremity strengthening along with balance and gait training to restore patient to her prior level of function.    OBJECTIVE IMPAIRMENTS: cardiopulmonary status limiting activity, decreased activity tolerance, decreased balance, decreased endurance, decreased knowledge of use of DME, decreased mobility, difficulty walking, decreased strength, and impaired UE functional use.   ACTIVITY LIMITATIONS: carrying, lifting, bending, standing, squatting, stairs, transfers, bed mobility, continence, bathing, toileting, dressing, reach over head, and hygiene/grooming  PARTICIPATION LIMITATIONS: meal prep, cleaning, laundry, driving, shopping, community activity, occupation, yard work, and church  PERSONAL FACTORS: Fitness are also affecting patient's functional outcome.   REHAB POTENTIAL: Good  CLINICAL DECISION MAKING: Stable/uncomplicated  EVALUATION COMPLEXITY: Low   GOALS: Goals reviewed with patient? Yes  SHORT TERM GOALS: Target date: 09/22/2023  Patient to be independent with initial HEP Baseline: Goal status: MET 09/20/23  2.  Patient to be able to prepare small meal for herself independently Baseline:  Goal status: In progress 09/20/23  3.  Patient to report increased appetite and improved nutrition Baseline:  Goal status: INITIAL   LONG TERM GOALS: Target date: 10/20/2023   Patient to be independent with advanced HEP  Baseline:  Goal status: INITIAL  2.  Patient to show improvement of at least 3 seconds on 5 times sit to stand test Baseline:  Goal status: INITIAL  3.  Patient to show improvement of at least 3 seconds on TUG test Baseline:  Goal status: INITIAL  4.  Patient to demonstrate good heel to toe progression and no loss of balance for 300 feet Baseline:  Goal status: INITIAL  5.   Patient to be able to resume driving Baseline:  Goal status: INITIAL  6.  Patient to be able to resume work.   Baseline:  Goal status: INITIAL   PLAN:  PT FREQUENCY: 1-2x/week  PT DURATION: 8 weeks  PLANNED INTERVENTIONS: 97146- PT Re-evaluation, 97110-Therapeutic exercises, 97530- Therapeutic activity, 97112- Neuromuscular re-education, 97535- Self Care, 13244- Manual therapy, [phone]- Gait training, [phone]- Canalith repositioning, [phone]- Aquatic Therapy, Patient/Family education, Balance training, Stair training, Taping, Dry Needling, Joint mobilization, Spinal mobilization, Vestibular training, Visual/preceptual remediation/compensation, DME instructions, Cryotherapy, Moist heat, Therapeutic exercises, Therapeutic activity, Neuromuscular re-education, Gait training, and Self Care  PLAN FOR NEXT SESSION: Continue Nustep, general lower and upper extremity strengthening, more functional strengthening and possibly 6 min walk test soon.    Victorino Dike B. Armoni Depass, PT 09/28/23 3:54 PM [facility] [address] [place], [place] [address] Phone # [phone] Fax [phone]

## 2023-09-28 NOTE — Telephone Encounter (Signed)
-----   Message from [facility] [name] L sent at 09/26/2023  2:00 PM EST ----- Hey,   She was seen at the pulm office and that doc mentioned her needing to be on asa??  She was taken off clopidogrel and put on eliquis in sept at her admission.   Can you help her figure out if she is suppose to take asa too and eliquis?   Kerry Hough, EMT-Paramedic  [phone] 09/26/2023

## 2023-09-28 NOTE — Progress Notes (Unsigned)
Paramedicine Encounter    Patient ID: Jocelyn [name], female    DOB: 1956/09/05, 67 y.o.   MRN: [id number]   Complaints-feeling ok, soreness is improving   Edema-none   Compliance with meds-yes   Pill box filled-yes If so, by whom-daughter fills pill box   Refills needed-needs to get back on the praluent   Pt reports she is doing better, her and her daughter are filing pill box together and daughter reports she is doing well. She denies c/p, no palpitations, no dizziness.  Pt reports energy level better,    Moving around the house better.   She just got scales and starting to weigh self now and it sends it to her phone.  She got some upsetting news at the pulm office at her last visit and it was about not driving for 6 months. This was not on her d/c papers or told to her or the family before she left the hosp. She did get referral from pulm to neuro for this poss seizure and the no driving part, so they are on the wait list for that.  She reports breathing is doing ok.   There was concern about her 02 sats dropping-she did about in the house she walked about 300-331ft and her 02 showed 91%. No sob, no dizziness.   Her b/p at PCP yesterday was 89/65 and once rechecked it, it was 97/67--this was after she had taken her meds.  When she was seen in pulm her b/p was great but no meds yet.  Sent message to triage ref her b/p.  No edema noted. No bleeding issues.  No falls.   Her pulm doc sent in order for 02 but they have not been called yet to set that up.    BP 120/82   Pulse 76   Resp 16   Wt 148 lb (67.1 kg)   SpO2 98%   BMI 27.07 kg/m no meds yet Weight yesterday-150 @ PCP office  Last visit weight-?  Patient Care Team: Renaye Rakers, MD as PCP - General (Family Medicine) Corky Crafts, MD as PCP - Cardiology (Cardiology)  Patient Active Problem List   Diagnosis Date Noted  . Physical deconditioning 08/23/2023  . Acute respiratory failure with hypoxia (HCC)  08/05/2023  . Pulmonary embolism (HCC) 08/03/2023  . Pulmonary edema 08/03/2023  . Degenerative arthritis of knee 08/20/2021  . Hx of pleural effusion   . Dyspnea 08/16/2018  . Fall on or from sidewalk curb, initial encounter 05/31/2018  . History of acute inferior wall MI 08/22/2017  . Diabetes mellitus (HCC)   . S/P right coronary artery (RCA) stent placement   . Overweight 05/05/2017  . Diabetes mellitus type 2, controlled, without complications (HCC) 12/16/2016  . Asthmatic bronchitis , chronic (HCC) 05/10/2016  . Polymyositis (HCC) 09/15/2015  . History of sarcoidosis 09/15/2015  . Falling 03/13/2015  . Edema 02/13/2014  . Coronary artery disease involving native heart without angina pectoris 10/29/2013  . Hyperlipidemia 10/29/2013  . Iron (Fe) deficiency anemia 08/08/2013  . Vitamin B 12 deficiency 08/08/2013  . GERD (gastroesophageal reflux disease) 09/08/2011  . Weakness 02/22/2011  . Depression 02/22/2011  . MENIERE'S DISEASE 04/30/2009  . Vitamin D deficiency 02/05/2009  . Venous (peripheral) insufficiency 12/12/2007  . RENAL CALCULUS 12/12/2007  . DIZZINESS 12/12/2007  . HYPERCHOLESTEROLEMIA 09/16/2007  . Anxiety state 09/16/2007  . HYPERTENSION, BENIGN 09/16/2007  . Allergic rhinitis 09/16/2007  . IRRITABLE BOWEL SYNDROME 09/16/2007  . Myalgia and myositis 09/16/2007  Current Outpatient Medications:  .  allopurinol (ZYLOPRIM) 100 MG tablet, Take 1 tablet by mouth daily as needed., Disp: , Rfl:  .  ALPRAZolam (XANAX) 0.5 MG tablet, Take 0.5 mg by mouth 3 (three) times daily., Disp: , Rfl:  .  apixaban (ELIQUIS) 5 MG TABS tablet, Take 10 mg by mouth twice daily, then on 08/17/2023-switch to 5 mg by mouth twice daily., Disp: 180 tablet, Rfl: 3 .  azaTHIOprine (IMURAN) 50 MG tablet, Take 1 tablet by mouth daily., Disp: , Rfl:  .  benzonatate (TESSALON) 100 MG capsule, Take 1 capsule (100 mg total) by mouth 3 (three) times daily as needed for cough., Disp: 30  capsule, Rfl: 1 .  Empagliflozin-metFORMIN HCl ER 25-1000 MG TB24, Take 1 tablet by mouth daily., Disp: , Rfl:  .  GEMTESA 75 MG TABS, Take 1 tablet by mouth daily., Disp: , Rfl:  .  icosapent Ethyl (VASCEPA) 1 g capsule, TAKE 2 CAPSULES(2 GRAMS) BY MOUTH TWICE DAILY, Disp: 360 capsule, Rfl: 3 .  metoprolol succinate (TOPROL-XL) 100 MG 24 hr tablet, Take 1 tablet (100 mg total) by mouth daily. Take with or immediately following a meal., Disp: 90 tablet, Rfl: 3 .  NEXLETOL 180 MG TABS, TAKE 1 TABLET BY MOUTH DAILY, Disp: 90 tablet, Rfl: 1 .  ONETOUCH VERIO test strip, CHECK BLOOD SUGAR TID AS DIRECTED, Disp: , Rfl:  .  pantoprazole (PROTONIX) 40 MG tablet, Take 1 tablet (40 mg total) by mouth daily., Disp: 30 tablet, Rfl: 0 .  spironolactone (ALDACTONE) 25 MG tablet, TAKE 1 TABLET BY MOUTH EVERY DAY, Disp: 90 tablet, Rfl: 2 .  nitroGLYCERIN (NITROSTAT) 0.4 MG SL tablet, Place 1 tablet (0.4 mg total) under the tongue every 5 (five) minutes as needed for chest pain. (Patient not taking: Reported on 09/28/2023), Disp: 25 tablet, Rfl: 2 .  PRALUENT 150 MG/ML SOAJ, ADMINISTER 1 ML UNDER THE SKIN EVERY 14 DAYS (Patient not taking: Reported on 09/28/2023), Disp: 6 mL, Rfl: 3 Allergies  Allergen Reactions  . Actos [Pioglitazone] Other (See Comments)    REACTION: pt states INTOL w/ edema  . Codeine Other (See Comments)    REACTION: vomiting  . Januvia [Sitagliptin] Nausea Only  . Lactose Intolerance (Gi) Diarrhea  . Morphine Nausea Only and Nausea And Vomiting    REACTION: vomiting REACTION: vomiting  . Repatha [Evolocumab]     WAS NOT EFFECTIVE IN LOWERING LDL      Social History   Socioeconomic History  . Marital status: Single    Spouse name: Not on file  . Number of children: 1  . Years of education: Not on file  . Highest education level: Not on file  Occupational History  . Occupation: Retired Runner, broadcasting/film/video   Tobacco Use  . Smoking status: Never  . Smokeless tobacco: Never  . Tobacco  comments:    Daily Caffeine - 1  Exercise 2-3 times/weekly  Vaping Use  . Vaping status: Never Used  Substance and Sexual Activity  . Alcohol use: No    Alcohol/week: 0.0 standard drinks of alcohol  . Drug use: No  . Sexual activity: Yes    Birth control/protection: None  Other Topics Concern  . Not on file  Social History Narrative   Exercises 2-3 times weekly   Caffeine Use: 1 daily (tea or Pepsi)   Lives alone in a one story home.  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     Social Determinants of Health   Financial Resource Strain: Low  Risk  (08/05/2023)   Overall Financial Resource Strain (CARDIA)   . Difficulty of Paying Living Expenses: Not hard at all  Food Insecurity: No Food Insecurity (08/05/2023)   Hunger Vital Sign   . Worried About Programme researcher, broadcasting/film/video in the Last Year: Never true   . Ran Out of Food in the Last Year: Never true  Transportation Needs: No Transportation Needs (08/05/2023)   PRAPARE - Transportation   . Lack of Transportation (Medical): No   . Lack of Transportation (Non-Medical): No  Physical Activity: Not on file  Stress: No Stress Concern Present (08/05/2023)   [employer] of Occupational Health - Occupational Stress Questionnaire   . Feeling of Stress : Not at all  Social Connections: Not on file  Intimate Partner Violence: Not At Risk (08/05/2023)   Humiliation, Afraid, Rape, and Kick questionnaire   . Fear of Current or Ex-Partner: No   . Emotionally Abused: No   . Physically Abused: No   . Sexually Abused: No    Physical Exam      Future Appointments  Date Time Provider Department Center  09/28/2023  2:45 PM Mikey Kirschner B, PT [facility] None  10/04/2023  2:15 PM Kalman Shan, MD [facility] None  10/05/2023  2:45 PM Mikey Kirschner B, PT [facility] None  11/01/2023  3:00 PM [facility] 1 [facility]  11/02/2023  2:15 PM Judi Saa, DO [facility] None  01/03/2024  1:15 PM Windell Norfolk, MD [facility] None   01/30/2024  3:00 PM Kathleene Hazel, MD [facility]       Kerry Hough, Paramedic [phone] [facility] Paramedic  09/28/23

## 2023-09-29 ENCOUNTER — Encounter: Payer: Self-pay | Admitting: Neurology

## 2023-09-29 ENCOUNTER — Ambulatory Visit: Payer: Medicare PPO | Admitting: Neurology

## 2023-09-29 VITALS — BP 144/83 | HR 74 | Resp 15 | Ht 62.0 in

## 2023-09-29 DIAGNOSIS — R569 Unspecified convulsions: Secondary | ICD-10-CM

## 2023-09-29 DIAGNOSIS — D329 Benign neoplasm of meninges, unspecified: Secondary | ICD-10-CM

## 2023-09-29 NOTE — Progress Notes (Signed)
[facility]  PATIENT: Jocelyn [name] DOB: 1955/12/21  REQUESTING CLINICIAN: Renaye Rakers, MD HISTORY FROM: Patient and daughter  REASON FOR VISIT: Meningioma/Seizure like activity    HISTORICAL  CHIEF COMPLAINT:  Chief Complaint  Patient presents with   New Patient (Initial Visit)    Rm12, daughter present,  NP internal referral for Meningioma, Seizure, pulmonary embolism, cardiac arrest, hospitalizatiion, denied sz like activity ever occuring    HISTORY OF PRESENT ILLNESS:  This is 67 year old woman past medical history of coronary artery disease, sarcoidosis, hypertension, diabetes mellitus type 2, who is presenting after being admitted to hospital in September for shortness of breath and found to have a massive PE.  Her hospitalization was complicated by PEA arrest requiring CPR and intubation.  While she was in the ICU, she was noted to have seizure-like activity described as eyes rolled back and stiffening.  At that time, she did have electrolytes derangement.  Her routine EEG was diffusely slow.  Patient was thought to have a provoked seizure, and was not started on antiseizure medication.  During her workup, she was also found to have a 6 mm meningioma.  She denies any previous history of seizures, denies any seizure risk factors.  Since discharge from the hospital, she continues to improve, she is doing physical therapy and currently has no other complaints.  She is eager to restart driving.  For the meningioma, she denies any headaches, denies any change in her vision, denies any focal neurological deficit.  OTHER MEDICAL CONDITIONS: oronary artery disease, sarcoidosis, hypertension, diabetes mellitus type 2   REVIEW OF SYSTEMS: Full 14 system review of systems performed and negative with exception of: As noted in the HPI   ALLERGIES: Allergies  Allergen Reactions   Actos [Pioglitazone] Other (See Comments)    REACTION: pt states INTOL w/ edema    Codeine Other (See Comments)    REACTION: vomiting   Januvia [Sitagliptin] Nausea Only   Lactose Intolerance (Gi) Diarrhea   Morphine Nausea Only and Nausea And Vomiting    REACTION: vomiting REACTION: vomiting   Repatha [Evolocumab]     WAS NOT EFFECTIVE IN LOWERING LDL    HOME MEDICATIONS: Outpatient Medications Prior to Visit  Medication Sig Dispense Refill   allopurinol (ZYLOPRIM) 100 MG tablet Take 1 tablet by mouth daily as needed.     ALPRAZolam (XANAX) 0.5 MG tablet Take 0.5 mg by mouth 3 (three) times daily.     apixaban (ELIQUIS) 5 MG TABS tablet Take 10 mg by mouth twice daily, then on 08/17/2023-switch to 5 mg by mouth twice daily. 180 tablet 3   azaTHIOprine (IMURAN) 50 MG tablet Take 1 tablet by mouth daily.     benzonatate (TESSALON) 100 MG capsule Take 1 capsule (100 mg total) by mouth 3 (three) times daily as needed for cough. 30 capsule 1   Empagliflozin-metFORMIN HCl ER 25-1000 MG TB24 Take 1 tablet by mouth daily.     GEMTESA 75 MG TABS Take 1 tablet by mouth daily.     icosapent Ethyl (VASCEPA) 1 g capsule TAKE 2 CAPSULES(2 GRAMS) BY MOUTH TWICE DAILY 360 capsule 3   metoprolol succinate (TOPROL-XL) 100 MG 24 hr tablet Take 1 tablet (100 mg total) by mouth daily. Take with or immediately following a meal. 90 tablet 3   NEXLETOL 180 MG TABS TAKE 1 TABLET BY MOUTH DAILY 90 tablet 1   nitroGLYCERIN (NITROSTAT) 0.4 MG SL tablet Place 1 tablet (0.4 mg total) under the tongue every  5 (five) minutes as needed for chest pain. 25 tablet 2   ONETOUCH VERIO test strip CHECK BLOOD SUGAR TID AS DIRECTED     pantoprazole (PROTONIX) 40 MG tablet Take 1 tablet (40 mg total) by mouth daily. 30 tablet 0   PRALUENT 150 MG/ML SOAJ ADMINISTER 1 ML UNDER THE SKIN EVERY 14 DAYS 6 mL 3   spironolactone (ALDACTONE) 25 MG tablet TAKE 1 TABLET BY MOUTH EVERY DAY 90 tablet 2   No facility-administered medications prior to visit.    PAST MEDICAL HISTORY: Past Medical History:  Diagnosis  Date   Acute bronchitis    Allergic rhinitis, cause unspecified    Anemia    Anxiety    B12 deficiency    BV (bacterial vaginosis) 06/22/1996   Calculus of kidney    Calculus of kidney    Coronary atherosclerosis of unspecified type of vessel, native or graft    Dizziness    Essential hypertension, benign    Fibroid 2003   Fibromyalgia    H/O dysmenorrhea 2008   H/O varicella    Headache(784.0)    frequently   HSV-2 infection 2009   Hyperplastic colon polyp 05/16/2014   Irritable bowel syndrome    Meniere's disease, unspecified    Menses, irregular 2003   Myalgia and myositis, unspecified    Myocardial infarction (HCC)    Obstructive sleep apnea (adult) (pediatric)    Perimenopausal symptoms 2003   Pure hypercholesterolemia    Sarcoidosis    Type II or unspecified type diabetes mellitus without mention of complication, not stated as uncontrolled    Unspecified venous (peripheral) insufficiency    Vitamin D deficiency    Vulvitis 2010   Yeast infection     PAST SURGICAL HISTORY: Past Surgical History:  Procedure Laterality Date   COLONOSCOPY     CORONARY STENT INTERVENTION N/A 06/15/2017   Procedure: CORONARY STENT INTERVENTION;  Surgeon: Corky Crafts, MD;  Location: [facility];  Service: Cardiovascular;  Laterality: N/A;   heart catherization     IR ANGIOGRAM PULMONARY BILATERAL SELECTIVE  08/04/2023   IR ANGIOGRAM SELECTIVE EACH ADDITIONAL VESSEL  08/04/2023   IR ANGIOGRAM SELECTIVE EACH ADDITIONAL VESSEL  08/04/2023   IR THROMBECT PRIM MECH INIT (INCLU) MOD SED  08/04/2023   IR US GUIDE VASC ACCESS RIGHT  08/04/2023   LEFT HEART CATH AND CORONARY ANGIOGRAPHY N/A 06/15/2017   Procedure: LEFT HEART CATH AND CORONARY ANGIOGRAPHY;  Surgeon: Corky Crafts, MD;  Location: [facility];  Service: Cardiovascular;  Laterality: N/A;   RADIOLOGY WITH ANESTHESIA N/A 08/04/2023   Procedure: IR WITH ANESTHESIA;  Surgeon: Julieanne Cotton, MD;  Location: [facility] OR;   Service: Radiology;  Laterality: N/A;   TONSILLECTOMY      FAMILY HISTORY: Family History  Adopted: Yes  Problem Relation Age of Onset   Other Other        ADOPTED   Colon cancer Neg Hx    Esophageal cancer Neg Hx    Rectal cancer Neg Hx    Stomach cancer Neg Hx     SOCIAL HISTORY: Social History   Socioeconomic History   Marital status: Single    Spouse name: Not on file   Number of children: 1   Years of education: Not on file   Highest education level: Not on file  Occupational History   Occupation: Retired Runner, broadcasting/film/video   Tobacco Use   Smoking status: Never   Smokeless tobacco: Never   Tobacco comments:  Daily Caffeine - 1  Exercise 2-3 times/weekly  Vaping Use   Vaping status: Never Used  Substance and Sexual Activity   Alcohol use: No    Alcohol/week: 0.0 standard drinks of alcohol   Drug use: No   Sexual activity: Yes    Birth control/protection: None  Other Topics Concern   Not on file  Social History Narrative   Exercises 2-3 times weekly   Caffeine Use: 1 daily (tea or Pepsi)   Lives alone in a one story home.  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     Social Determinants of Health   Financial Resource Strain: Low Risk  (08/05/2023)   Overall Financial Resource Strain (CARDIA)    Difficulty of Paying Living Expenses: Not hard at all  Food Insecurity: No Food Insecurity (08/05/2023)   Hunger Vital Sign    Worried About Running Out of Food in the Last Year: Never true    Ran Out of Food in the Last Year: Never true  Transportation Needs: No Transportation Needs (08/05/2023)   PRAPARE - Administrator, Civil Service (Medical): No    Lack of Transportation (Non-Medical): No  Physical Activity: Not on file  Stress: No Stress Concern Present (08/05/2023)   [employer] of Occupational Health - Occupational Stress Questionnaire    Feeling of Stress : Not at all  Social Connections: Not on file  Intimate Partner Violence: Not At Risk  (08/05/2023)   Humiliation, Afraid, Rape, and Kick questionnaire    Fear of Current or Ex-Partner: No    Emotionally Abused: No    Physically Abused: No    Sexually Abused: No    PHYSICAL EXAM   GENERAL EXAM/CONSTITUTIONAL: Vitals:  Vitals:   09/29/23 0928  BP: (!) 144/83  Pulse: 74  Resp: 15  SpO2: 96%  Height: 5\' 2"  (1.575 m)   Body mass index is 27.07 kg/m. Wt Readings from Last 3 Encounters:  09/28/23 148 lb (67.1 kg)  09/21/23 147 lb 3.2 oz (66.8 kg)  09/01/23 146 lb (66.2 kg)   Patient is in no distress; well developed, nourished and groomed; neck is supple  MUSCULOSKELETAL: Gait, strength, tone, movements noted in Neurologic exam below  NEUROLOGIC: MENTAL STATUS:      No data to display         awake, alert, oriented to person, place and time recent and remote memory intact normal attention and concentration language fluent, comprehension intact, naming intact fund of knowledge appropriate  CRANIAL NERVE:  2nd, 3rd, 4th, 6th - Visual fields full to confrontation, extraocular muscles intact, no nystagmus 5th - facial sensation symmetric 7th - facial strength symmetric 8th - hearing intact 9th - palate elevates symmetrically, uvula midline 11th - shoulder shrug symmetric 12th - tongue protrusion midline  MOTOR:  normal bulk and tone, full strength in the BUE, BLE  SENSORY:  normal and symmetric to light touch  COORDINATION:  finger-nose-finger, fine finger movements normal  GAIT/STATION:  normal     DIAGNOSTIC DATA (LABS, IMAGING, TESTING) - I reviewed patient records, labs, notes, testing and imaging myself where available.  Lab Results  Component Value Date   WBC 8.3 08/09/2023   HGB 8.1 (L) 08/09/2023   HCT 25.8 (L) 08/09/2023   MCV 108.4 (H) 08/09/2023   PLT 150 08/09/2023      Component Value Date/Time   NA 142 08/10/2023 0346   NA 146 (H) 11/06/2019 1519   K 3.7 08/10/2023 0346   CL 104 08/10/2023  0346   CO2 32  08/10/2023 0346   GLUCOSE 79 08/10/2023 0346   BUN 11 08/10/2023 0346   BUN 13 11/06/2019 1519   CREATININE 1.13 (H) 08/10/2023 0346   CREATININE 0.83 06/01/2016 1254   CALCIUM 8.2 (L) 08/10/2023 0346   PROT 5.0 (L) 08/10/2023 0346   PROT 7.0 04/16/2020 1505   ALBUMIN 2.4 (L) 08/10/2023 0346   ALBUMIN 4.4 04/16/2020 1505   AST 23 08/10/2023 0346   ALT 36 08/10/2023 0346   ALKPHOS 41 08/10/2023 0346   BILITOT 1.0 08/10/2023 0346   BILITOT 0.3 04/16/2020 1505   GFRNONAA 53 (L) 08/10/2023 0346   GFRAA 105 11/06/2019 1519   Lab Results  Component Value Date   CHOL 205 (H) 12/16/2022   HDL 38 (L) 12/16/2022   LDLCALC 135 (H) 12/16/2022   LDLDIRECT 137 (H) 12/16/2022   TRIG 179 (H) 12/16/2022   CHOLHDL 5.4 (H) 12/16/2022   Lab Results  Component Value Date   HGBA1C 6.2 (H) 08/03/2023   No results found for: "VITAMINB12" Lab Results  Component Value Date   TSH 0.90 08/16/2018    MRI Brain 08/06/2023 1. No acute finding or correlate for seizure history. 2. Mild chronic white matter disease. 3. 6 mm meningioma along the left frontal convexity.   EEG 08/04/2023 Continuous slow, generalized    ASSESSMENT AND PLAN  67 y.o. year old female with coronary artery disease, sarcoidosis, hypertension, diabetes mellitus type 2, who is presenting after being admitted to the hospital for shortness of breath, found to have pulmonary embolism, hospitalization complicated by PEA arrest and seizure-like activity.  She was also found to have a 6 mm meningioma.  Patient seizure was a provoked seizure, hence does not need antiseizure medication.  She denies any previous history of seizure and no seizure risk factor.  Will continue to observe patient. Discussed driving restriction for a total of 6 months.  In terms of her meningioma, it is small 6 mm with no brain compression, we will continue to observe patient.  I will repeat the MRI in a year when she comes for follow-up.  Continue to  follow-up with your doctors and return as scheduled   1. Meningioma (HCC)   2. Seizure-like activity (HCC)      Patient Instructions  Continue current medications Continue follow-up with your doctors Return in 9 months, at that time we will repeat MRI brain Discussed driving restriction for total of 6 months, but patient and daughter voiced understanding.  No orders of the defined types were placed in this encounter.   No orders of the defined types were placed in this encounter.   Return in about 10 months (around 07/29/2024).    Windell Norfolk, MD 09/29/2023, 10:38 AM  [facility] [address], [address] [place], [place] [address] [phone]

## 2023-09-29 NOTE — Patient Instructions (Signed)
Continue current medications Continue follow-up with your doctors Return in 9 months, at that time we will repeat MRI brain Discussed driving restriction for total of 6 months, but patient and daughter voiced understanding.

## 2023-10-03 DIAGNOSIS — M3322 Polymyositis with myopathy: Secondary | ICD-10-CM | POA: Diagnosis not present

## 2023-10-03 NOTE — Progress Notes (Unsigned)
Previous [facility] pulmonary encounter: 06/13/19- Dr. Everardo All     Jocelyn [name] is a 67 year old female with chronic bronchitis, hx of sarcoid diagnosed via bronchoscopy in 1980s with recurrence, OSA, HTN, CAD s/p DES to RCA in 2019, chronic diastolic heart failure and polymyositis previously on methotrexate and anxiety who presents for follow-up of shortness of breath.   Former Dr. Kriste Basque patient: Followed for annual episodes of acute bronchitis treated with antibiotics, inhalers and steroids. She has chronic dyspnea on exertion and is sedentary at baseline. Of note, during hospitalization 08/2019 found with left paramediastinal mass. Repeat imaging demonstrated complete resolution after initiating bronchodilator therapy so this abnormality likely represented atelectasis.   Since our last visit in 12/2018, her bronchodilator was changed to Advair. She does not feel any different, no worse or better. She reports her shortness of breath is minimal but states she limits her activity so not sure if she is doing well. Also ordered sleep study however not completed due to pandemic. Has not had to use her albuterol. She continues to report excessive daytime sleepiness and fatigue. Denies falling asleep when talking to people however will sleep easily if she is left alone and/or laying down. Her sleep hours are irregular and does not have a routine bed ritual. Considering returning to her part time job for limited hours at [employer].    Epworth: 9  04/22/2021 67 year old female, never smoked. PMH significant for HTN, CAD, mild OSA, Covid-19, asthmatic bronchitis, allergic rhinitis, GERD, IBS, type 2 DM. Patient of Dr. Everardo All, last seen on 06/13/19.  Patient tested positive for covid on 6/3. She had symptoms of chills, HA, chest congested, dry cough and diarrhea started on Thursday. Sent in paxlovid.  No significant respiratory complaints. Cough and sore throat have improved. Very mild dyspnea when she exerts  herself. Diarrhea has slowed down some. She is having 4 loose stools a day, previously she was having diarrhea every hour. She is taking imodium as needed. She went to [facility] today for covid testing. She was told her heart rate was 51. She is having no vision changes or lightheadedness.   Mild sleep apnea: - She never had HST that was ordered back in 2020 d/t insurance coverage/cost  - Denies overt apnea symptoms     09/21/2023 Patient presents today for overdue follow-up. She is followed by our office for history of chronic bronchitis and mild OSA. Last seen in 2022 for Covid infection. Did not have repeat HST due to insurance coverage.   Discussed the use of AI scribe software for clinical note transcription with the patient, who gave verbal consent to proceed.  History of Present Illness   The patient, with a history of chronic bronchitis, mild sleep apnea, and polymyositis, presents for a follow-up visit after a recent hospitalization due to a pulmonary embolism. The patient experienced a cardiac arrest during the hospital stay and was successfully resuscitated. The patient reports confusion about the events surrounding the hospitalization and the subsequent follow-up care.  The patient's sleep apnea symptoms are reportedly minimal, with the patient waking up to use the bathroom at night but able to fall back asleep. The patient denies snoring or waking up gasping or choking. The patient is not currently interested in re-evaluating her sleep apnea with another sleep study.  Regarding the patient's chronic bronchitis, the patient was trialed off of the inhaler, Advair, about four years ago. The patient reports shortness of breath with exertion, which has been a persistent  symptom since the hospitalization. The patient denies any bleeding while on Eliquis, a blood thinner prescribed after the pulmonary embolism.  The patient also reports voice hoarseness since being intubated during the hospital  stay. The patient's voice quality varies, sometimes being louder than other times. The patient has not seen an ear, nose, and throat doctor for this issue.  The patient is also on Imuran for polymyositis and is followed by a rheumatologist for this condition. The patient reports no changes in this condition.    OV 10/03/2023  Subjective:  Patient ID: Jocelyn [name], female , DOB: [birth year] , age 67 y.o. , MRN: [id number] , ADDRESS: [address] [place] [address] PCP Renaye Rakers, MD Patient Care Team: Renaye Rakers, MD as PCP - General (Family Medicine) Corky Crafts, MD as PCP - Cardiology (Cardiology)  This Provider for this visit: Treatment Team:  Attending Provider: Kalman Shan, MD    10/03/2023 -  No chief complaint on file.    HPI [employer] 67 y.o. -    CT Chest data from date: ****  - personally visualized and independently interpreted : *** - my findings are: ***  IMPRESSION: Extensive bilateral pulmonary emboli as described with evidence of right heart strain within RV/LV ratio of 1.74.   No other focal abnormality is noted.   Aortic Atherosclerosis (ICD10-I70.0).   Critical Value/emergent results were called by telephone at the time of interpretation on 08/03/2023 at 9:43 pm to Dr. Glyn Ade , who verbally acknowledged these results.     Electronically Signed   By: Alcide Clever M.D.   On: 08/03/2023 21:46  PFT      No data to display            LAB RESULTS last 96 hours No results found.  LAB RESULTS last 90 days Recent Results (from the past 2160 hour(s))  MYOCARDIAL PERFUSION IMAGING     Status: None   Collection Time: 08/02/23 11:05 AM  Result Value Ref Range   Rest Nuclear Isotope Dose 9.9 mCi   Stress Nuclear Isotope Dose 30.8 mCi   Rest HR 84.0 bpm   Rest BP 107/67 mmHg   Peak HR 102 bpm   Peak BP 128/72 mmHg   SSS 8.0    SRS 0.0    SDS 8.0    TID 1.11    LV sys vol 12.0 mL   LV dias vol  38.0 46 - 106 mL   Nuc Stress EF 69 %   ST Depression (mm) 0 mm  CBC with Differential     Status: Abnormal   Collection Time: 08/03/23  7:54 PM  Result Value Ref Range   WBC 6.7 4.0 - 10.5 K/uL   RBC 3.31 (L) 3.87 - 5.11 MIL/uL   Hemoglobin 11.7 (L) 12.0 - 15.0 g/dL   HCT 13.0 (L) 86.5 - 78.4 %   MCV 108.5 (H) 80.0 - 100.0 fL   MCH 35.3 (H) 26.0 - 34.0 pg   MCHC 32.6 30.0 - 36.0 g/dL   RDW 69.6 (H) 29.5 - 28.4 %   Platelets 220 150 - 400 K/uL   nRBC 4.5 (H) 0.0 - 0.2 %    Comment: REPEATED TO VERIFY   Neutrophils Relative % 72 %   Neutro Abs 4.8 1.7 - 7.7 K/uL   Lymphocytes Relative 19 %   Lymphs Abs 1.3 0.7 - 4.0 K/uL   Monocytes Relative 8 %   Monocytes Absolute 0.6 0.1 - 1.0 K/uL  Eosinophils Relative 0 %   Eosinophils Absolute 0.0 0.0 - 0.5 K/uL   Basophils Relative 0 %   Basophils Absolute 0.0 0.0 - 0.1 K/uL   Immature Granulocytes 1 %   Abs Immature Granulocytes 0.07 0.00 - 0.07 K/uL    Comment: Performed at [facility], [address], [place], [place] [address]  Basic metabolic panel     Status: Abnormal   Collection Time: 08/03/23  7:54 PM  Result Value Ref Range   Sodium 140 135 - 145 mmol/L   Potassium 5.2 (H) 3.5 - 5.1 mmol/L   Chloride 105 98 - 111 mmol/L   CO2 12 (L) 22 - 32 mmol/L   Glucose, Bld 101 (H) 70 - 99 mg/dL    Comment: Glucose reference range applies only to samples taken after fasting for at least 8 hours.   BUN 34 (H) 8 - 23 mg/dL   Creatinine, Ser 1.91 (H) 0.44 - 1.00 mg/dL   Calcium 9.6 8.9 - 47.8 mg/dL   GFR, Estimated 32 (L) >60 mL/min    Comment: (NOTE) Calculated using the CKD-EPI Creatinine Equation (2021)    Anion gap 23 (H) 5 - 15    Comment: REPEATED TO VERIFY Performed at [facility], [address] [place], [place] [address]   Troponin I (High Sensitivity)     Status: Abnormal   Collection Time: 08/03/23  7:54 PM  Result Value Ref Range   Troponin I (High Sensitivity) 119 (HH) <18  ng/L    Comment: CRITICAL RESULT CALLED TO, READ BACK BY AND VERIFIED WITH: JESSICA FERRINOLO,RN AT 2112 ON 08/03/23 BY BLITTLE (NOTE) Elevated high sensitivity troponin I (hsTnI) values and significant  changes across serial measurements may suggest ACS but many other  chronic and acute conditions are known to elevate hsTnI results.  Refer to the Links section for chest pain algorithms and additional  guidance. Performed at [facility], [address] [place], [place] [address]   Resp panel by RT-PCR (RSV, Flu A&B, Covid) Anterior Nasal Swab     Status: None   Collection Time: 08/03/23  7:54 PM   Specimen: Anterior Nasal Swab  Result Value Ref Range   SARS Coronavirus 2 by RT PCR NEGATIVE NEGATIVE    Comment: (NOTE) SARS-CoV-2 target nucleic acids are NOT DETECTED.  The SARS-CoV-2 RNA is generally detectable in upper respiratory specimens during the acute phase of infection. The lowest concentration of SARS-CoV-2 viral copies this assay can detect is 138 copies/mL. A negative result does not preclude SARS-Cov-2 infection and should not be used as the sole basis for treatment or other patient management decisions. A negative result may occur with  improper specimen collection/handling, submission of specimen other than nasopharyngeal swab, presence of viral mutation(s) within the areas targeted by this assay, and inadequate number of viral copies(<138 copies/mL). A negative result must be combined with clinical observations, patient history, and epidemiological information. The expected result is Negative.  Fact Sheet for Patients:  BloggerCourse.com  Fact Sheet for Healthcare Providers:  SeriousBroker.it  This test is no t yet approved or cleared by the [place] FDA and  has been authorized for detection and/or diagnosis of SARS-CoV-2 by FDA under an Emergency Use Authorization (EUA). This EUA will  remain  in effect (meaning this test can be used) for the duration of the COVID-19 declaration under Section 564(b)(1) of the Act, 21 U.S.C.section 360bbb-3(b)(1), unless the authorization is terminated  or revoked sooner.  Influenza A by PCR NEGATIVE NEGATIVE   Influenza B by PCR NEGATIVE NEGATIVE    Comment: (NOTE) The Xpert Xpress SARS-CoV-2/FLU/RSV plus assay is intended as an aid in the diagnosis of influenza from Nasopharyngeal swab specimens and should not be used as a sole basis for treatment. Nasal washings and aspirates are unacceptable for Xpert Xpress SARS-CoV-2/FLU/RSV testing.  Fact Sheet for Patients: BloggerCourse.com  Fact Sheet for Healthcare Providers: SeriousBroker.it  This test is not yet approved or cleared by the [place] FDA and has been authorized for detection and/or diagnosis of SARS-CoV-2 by FDA under an Emergency Use Authorization (EUA). This EUA will remain in effect (meaning this test can be used) for the duration of the COVID-19 declaration under Section 564(b)(1) of the Act, 21 U.S.C. section 360bbb-3(b)(1), unless the authorization is terminated or revoked.     Resp Syncytial Virus by PCR NEGATIVE NEGATIVE    Comment: (NOTE) Fact Sheet for Patients: BloggerCourse.com  Fact Sheet for Healthcare Providers: SeriousBroker.it  This test is not yet approved or cleared by the [place] FDA and has been authorized for detection and/or diagnosis of SARS-CoV-2 by FDA under an Emergency Use Authorization (EUA). This EUA will remain in effect (meaning this test can be used) for the duration of the COVID-19 declaration under Section 564(b)(1) of the Act, 21 U.S.C. section 360bbb-3(b)(1), unless the authorization is terminated or revoked.  Performed at [facility], [address] [place], [place] [address]   Lactic  acid, plasma     Status: Abnormal   Collection Time: 08/03/23  7:54 PM  Result Value Ref Range   Lactic Acid, Venous 7.6 (HH) 0.5 - 1.9 mmol/L    Comment: CRITICAL RESULT CALLED TO, READ BACK BY AND VERIFIED WITH: Oneita Hurt AT 2136 ON 08/03/23 BY BLITTLE Performed at [facility], [address], [place], [place] [address]   Blood culture (routine x 2)     Status: None   Collection Time: 08/03/23  8:03 PM   Specimen: BLOOD  Result Value Ref Range   Specimen Description      BLOOD RIGHT ANTECUBITAL Performed at [facility], [address], [place], [place] [address]    Special Requests      BOTTLES DRAWN AEROBIC AND ANAEROBIC Blood Culture adequate volume Performed at [facility], [address], [place], [place] [address]    Culture      NO GROWTH 5 DAYS Performed at [facility], [address], [place], [place] [address]    Report Status 08/09/2023 FINAL   Blood culture (routine x 2)     Status: None   Collection Time: 08/03/23  8:04 PM   Specimen: BLOOD  Result Value Ref Range   Specimen Description      BLOOD LEFT ANTECUBITAL Performed at [facility], [address] [place], [place] [address]    Special Requests      BOTTLES DRAWN AEROBIC AND ANAEROBIC Blood Culture adequate volume Performed at [facility], [address] [place], [place] [address]    Culture      NO GROWTH 5 DAYS Performed at [facility], [address], [place], [place] [address]    Report Status 08/09/2023 FINAL   MRSA Next Gen by PCR, Nasal     Status: None   Collection Time: 08/03/23 10:54 PM   Specimen: Nasal Mucosa; Nasal Swab  Result Value Ref Range   MRSA by PCR Next Gen NOT DETECTED NOT  DETECTED    Comment: (NOTE) The GeneXpert MRSA Assay (FDA approved for NASAL specimens only), is one component of a comprehensive MRSA colonization surveillance program. It is  not intended to diagnose MRSA infection nor to guide or monitor treatment for MRSA infections. Test performance is not FDA approved in patients less than 95 years old. Performed at [facility], [address], [place], [place] [address]   Glucose, capillary     Status: Abnormal   Collection Time: 08/03/23 10:57 PM  Result Value Ref Range   Glucose-Capillary 53 (L) 70 - 99 mg/dL    Comment: Glucose reference range applies only to samples taken after fasting for at least 8 hours.  Glucose, capillary     Status: Abnormal   Collection Time: 08/03/23 11:49 PM  Result Value Ref Range   Glucose-Capillary 194 (H) 70 - 99 mg/dL    Comment: Glucose reference range applies only to samples taken after fasting for at least 8 hours.  Lactic acid, plasma     Status: Abnormal   Collection Time: 08/03/23 11:59 PM  Result Value Ref Range   Lactic Acid, Venous >9.0 (HH) 0.5 - 1.9 mmol/L    Comment: CRITICAL RESULT CALLED TO, READ BACK BY AND VERIFIED WITH K.MOLEN RN 0104 08/04/2023 BY G.GANADEN Performed at [facility], [address] [place], [place] [address]   Troponin I (High Sensitivity)     Status: Abnormal   Collection Time: 08/03/23 11:59 PM  Result Value Ref Range   Troponin I (High Sensitivity) 122 (HH) <18 ng/L    Comment: CRITICAL RESULT CALLED TO, READ BACK BY AND VERIFIED WITH [facility] RN 0119 08/04/2023 BY G.GANADEN (NOTE) Elevated high sensitivity troponin I (hsTnI) values and significant  changes across serial measurements may suggest ACS but many other  chronic and acute conditions are known to elevate hsTnI results.  Refer to the "Links" section for chest pain algorithms and additional  guidance. Performed at [facility], [address] [place], [place] [address]   APTT within 30 minutes of completion of TNKase     Status: Abnormal   Collection Time: 08/03/23 11:59 PM  Result Value Ref Range   aPTT 141 (H) 24 - 36 seconds    Comment:        IF  BASELINE aPTT IS ELEVATED, SUGGEST PATIENT RISK ASSESSMENT BE USED TO DETERMINE APPROPRIATE ANTICOAGULANT THERAPY. Performed at [facility], [address], [place], [place] [address]   Protime-INR within 30 minutes of completion of TNKase     Status: Abnormal   Collection Time: 08/03/23 11:59 PM  Result Value Ref Range   Prothrombin Time 23.6 (H) 11.4 - 15.2 seconds   INR 2.1 (H) 0.8 - 1.2    Comment: (NOTE) INR goal varies based on device and disease states. Performed at [facility], [address] [place], [place] [address]   CBC within 30 minutes of completion of TNKase     Status: Abnormal   Collection Time: 08/03/23 11:59 PM  Result Value Ref Range   WBC 8.3 4.0 - 10.5 K/uL   RBC 2.69 (L) 3.87 - 5.11 MIL/uL   Hemoglobin 9.4 (L) 12.0 - 15.0 g/dL   HCT 85.4 (L) 62.7 - 03.5 %   MCV 115.2 (H) 80.0 - 100.0 fL   MCH 34.9 (H) 26.0 - 34.0 pg   MCHC 30.3 30.0 - 36.0 g/dL   RDW 00.9 (H) 38.1 - 82.9 %   Platelets  146 (L) 150 - 400 K/uL   nRBC 5.9 (H) 0.0 - 0.2 %    Comment: Performed at [facility], [address] [place], [place] [address]  Hemoglobin A1c     Status: Abnormal   Collection Time: 08/03/23 11:59 PM  Result Value Ref Range   Hgb A1c MFr Bld 6.2 (H) 4.8 - 5.6 %    Comment: (NOTE) Pre diabetes:          5.7%-6.4%  Diabetes:              >6.4%  Glycemic control for   <7.0% adults with diabetes    Mean Plasma Glucose 131.24 mg/dL    Comment: Performed at [facility], [address], [place], [place] [address]  I-STAT 7, (LYTES, BLD GAS, ICA, H+H)     Status: Abnormal   Collection Time: 08/04/23 12:32 AM  Result Value Ref Range   pH, Arterial 6.837 (LL) 7.35 - 7.45   pCO2 arterial 38.6 32 - 48 mmHg   pO2, Arterial 139 (H) 83 - 108 mmHg   Bicarbonate 6.5 (L) 20.0 - 28.0 mmol/L   TCO2 8 (L) 22 - 32 mmol/L   O2 Saturation 96 %   Acid-base deficit 26.0 (H) 0.0 - 2.0 mmol/L   Sodium 139 135 - 145 mmol/L   Potassium 4.4 3.5 - 5.1  mmol/L   Calcium, Ion 1.14 (L) 1.15 - 1.40 mmol/L   HCT 27.0 (L) 36.0 - 46.0 %   Hemoglobin 9.2 (L) 12.0 - 15.0 g/dL   Patient temperature 43.3 F    Collection site RADIAL, ALLEN'S TEST ACCEPTABLE    Drawn by RT    Sample type ARTERIAL    Comment NOTIFIED PHYSICIAN   Glucose, capillary     Status: Abnormal   Collection Time: 08/04/23  1:10 AM  Result Value Ref Range   Glucose-Capillary 188 (H) 70 - 99 mg/dL    Comment: Glucose reference range applies only to samples taken after fasting for at least 8 hours.  HIV Antibody (routine testing w rflx)     Status: None   Collection Time: 08/04/23  2:49 AM  Result Value Ref Range   HIV Screen 4th Generation wRfx Non Reactive Non Reactive    Comment: Performed at [facility], [address] [place], [place] [address]  CBC     Status: Abnormal   Collection Time: 08/04/23  2:49 AM  Result Value Ref Range   WBC 12.7 (H) 4.0 - 10.5 K/uL   RBC 2.94 (L) 3.87 - 5.11 MIL/uL   Hemoglobin 10.9 (L) 12.0 - 15.0 g/dL   HCT 84.1 (L) 66.0 - 63.0 %   MCV 111.9 (H) 80.0 - 100.0 fL   MCH 37.1 (H) 26.0 - 34.0 pg   MCHC 33.1 30.0 - 36.0 g/dL   RDW 16.0 (H) 10.9 - 32.3 %   Platelets 151 150 - 400 K/uL   nRBC 4.6 (H) 0.0 - 0.2 %    Comment: Performed at [facility], [address], [place], [place] [address]  Basic metabolic panel     Status: Abnormal   Collection Time: 08/04/23  2:49 AM  Result Value Ref Range   Sodium 141 135 - 145 mmol/L   Potassium 5.2 (H) 3.5 - 5.1 mmol/L   Chloride 108 98 - 111 mmol/L   CO2 9 (L) 22 - 32 mmol/L   Glucose, Bld 212 (H) 70 - 99 mg/dL  Comment: Glucose reference range applies only to samples taken after fasting for at least 8 hours.   BUN 31 (H) 8 - 23 mg/dL   Creatinine, Ser 0.98 (H) 0.44 - 1.00 mg/dL   Calcium 8.2 (L) 8.9 - 10.3 mg/dL   GFR, Estimated 30 (L) >60 mL/min    Comment: (NOTE) Calculated using the CKD-EPI Creatinine Equation (2021)    Anion gap 24 (H) 5 - 15    Comment: ELECTROLYTES  REPEATED TO VERIFY Performed at [facility], [address] [place], [place] [address]   Magnesium     Status: None   Collection Time: 08/04/23  2:49 AM  Result Value Ref Range   Magnesium 1.7 1.7 - 2.4 mg/dL    Comment: Performed at [facility], [address] [place], [place] [address]  Phosphorus     Status: Abnormal   Collection Time: 08/04/23  2:49 AM  Result Value Ref Range   Phosphorus 7.5 (H) 2.5 - 4.6 mg/dL    Comment: Performed at [facility], [address] [place], [place] [address]  APTT     Status: Abnormal   Collection Time: 08/04/23  2:49 AM  Result Value Ref Range   aPTT 103 (H) 24 - 36 seconds    Comment:        IF BASELINE aPTT IS ELEVATED, SUGGEST PATIENT RISK ASSESSMENT BE USED TO DETERMINE APPROPRIATE ANTICOAGULANT THERAPY. Performed at [facility], [address], [place], [place] [address]   ABO/Rh     Status: None   Collection Time: 08/04/23  2:49 AM  Result Value Ref Range   ABO/RH(D)      O POS Performed at [facility], [address], [place], [place] [address]   I-STAT 7, (LYTES, BLD GAS, ICA, H+H)     Status: Abnormal   Collection Time: 08/04/23  5:22 AM  Result Value Ref Range   pH, Arterial 7.105 (LL) 7.35 - 7.45   pCO2 arterial 28.2 (L) 32 - 48 mmHg   pO2, Arterial 157 (H) 83 - 108 mmHg   Bicarbonate 8.9 (L) 20.0 - 28.0 mmol/L   TCO2 10 (L) 22 - 32 mmol/L   O2 Saturation 99 %   Acid-base deficit 19.0 (H) 0.0 - 2.0 mmol/L   Sodium 137 135 - 145 mmol/L   Potassium 4.8 3.5 - 5.1 mmol/L   Calcium, Ion 1.06 (L) 1.15 - 1.40 mmol/L   HCT 29.0 (L) 36.0 - 46.0 %   Hemoglobin 9.9 (L) 12.0 - 15.0 g/dL   Patient temperature 95.2 F    Collection site RADIAL, ALLEN'S TEST ACCEPTABLE    Drawn by RT    Sample type ARTERIAL    Comment NOTIFIED PHYSICIAN   Glucose, capillary     Status: Abnormal   Collection Time: 08/04/23  6:13 AM  Result Value Ref Range   Glucose-Capillary 225 (H) 70 - 99 mg/dL    Comment:  Glucose reference range applies only to samples taken after fasting for at least 8 hours.  Glucose, capillary     Status: Abnormal   Collection Time: 08/04/23  7:51 AM  Result Value Ref Range   Glucose-Capillary 241 (H) 70 - 99 mg/dL    Comment: Glucose reference range applies only to samples taken after fasting for at least 8 hours.  I-STAT 7, (LYTES, BLD GAS, ICA, H+H)     Status: Abnormal   Collection Time: 08/04/23  8:43 AM  Result Value  Ref Range   pH, Arterial 7.184 (LL) 7.35 - 7.45   pCO2 arterial 20.6 (L) 32 - 48 mmHg   pO2, Arterial 133 (H) 83 - 108 mmHg   Bicarbonate 8.1 (L) 20.0 - 28.0 mmol/L   TCO2 9 (L) 22 - 32 mmol/L   O2 Saturation 99 %   Acid-base deficit 19.0 (H) 0.0 - 2.0 mmol/L   Sodium 137 135 - 145 mmol/L   Potassium 4.4 3.5 - 5.1 mmol/L   Calcium, Ion 1.03 (L) 1.15 - 1.40 mmol/L   HCT 28.0 (L) 36.0 - 46.0 %   Hemoglobin 9.5 (L) 12.0 - 15.0 g/dL   Patient temperature 02.7 C    Collection site RADIAL, ALLEN'S TEST ACCEPTABLE    Drawn by RT    Sample type ARTERIAL    Comment NOTIFIED PHYSICIAN   APTT     Status: None   Collection Time: 08/04/23 11:23 AM  Result Value Ref Range   aPTT 35 24 - 36 seconds    Comment: Performed at [facility], [address], [place], [place] [address]  Lactic acid, plasma     Status: Abnormal   Collection Time: 08/04/23 11:26 AM  Result Value Ref Range   Lactic Acid, Venous >9.0 (HH) 0.5 - 1.9 mmol/L    Comment: CRITICAL VALUE NOTED. VALUE IS CONSISTENT WITH PREVIOUSLY REPORTED/CALLED VALUE Performed at [facility], [address] [place], [place] [address]   Type and screen     Status: None   Collection Time: 08/04/23 11:30 AM  Result Value Ref Range   ABO/RH(D) O POS    Antibody Screen NEG    Sample Expiration 08/07/2023,2359    Unit Number [id number]    Blood Component Type RED CELLS,LR    Unit division 00    Status of Unit ISSUED,FINAL    Transfusion Status OK TO TRANSFUSE    Crossmatch Result       Compatible Performed at [facility], [address], [place], [place] [address]   BPAM RBC     Status: None   Collection Time: 08/04/23 11:30 AM  Result Value Ref Range   ISSUE DATE / TIME [id number]    Blood Product Unit Number [id number]    PRODUCT CODE [id number]    Unit Type and Rh 5100    Blood Product Expiration Date [id number]   Glucose, capillary     Status: Abnormal   Collection Time: 08/04/23 11:34 AM  Result Value Ref Range   Glucose-Capillary 216 (H) 70 - 99 mg/dL    Comment: Glucose reference range applies only to samples taken after fasting for at least 8 hours.  ECHOCARDIOGRAM COMPLETE     Status: None   Collection Time: 08/04/23  2:20 PM  Result Value Ref Range   Weight 2,430.35 oz   Height 62 in   BP 84/58 mmHg   S' Lateral 2.20 cm   AR max vel 1.96 cm2   AV Peak grad 6.5 mmHg   Ao pk vel 1.27 m/s   Area-P 1/2 2.69 cm2   Est EF 60 - 65%   Glucose, capillary     Status: Abnormal   Collection Time: 08/04/23  3:39 PM  Result Value Ref Range   Glucose-Capillary 184 (H) 70 - 99 mg/dL    Comment: Glucose reference range applies only to samples taken after fasting for at least 8 hours.  I-STAT 7, (LYTES, BLD GAS, ICA, H+H)     Status: Abnormal  Collection Time: 08/04/23  3:40 PM  Result Value Ref Range   pH, Arterial 7.494 (H) 7.35 - 7.45   pCO2 arterial 27.7 (L) 32 - 48 mmHg   pO2, Arterial 198 (H) 83 - 108 mmHg   Bicarbonate 21.2 20.0 - 28.0 mmol/L   TCO2 22 22 - 32 mmol/L   O2 Saturation 100 %   Acid-base deficit 1.0 0.0 - 2.0 mmol/L   Sodium 136 135 - 145 mmol/L   Potassium 4.1 3.5 - 5.1 mmol/L   Calcium, Ion 0.95 (L) 1.15 - 1.40 mmol/L   HCT 25.0 (L) 36.0 - 46.0 %   Hemoglobin 8.5 (L) 12.0 - 15.0 g/dL   Patient temperature 16.1 F    Collection site RADIAL, ALLEN'S TEST ACCEPTABLE    Drawn by RT    Sample type ARTERIAL   Magnesium     Status: Abnormal   Collection Time: 08/04/23  4:15 PM  Result Value Ref Range   Magnesium 1.1 (L)  1.7 - 2.4 mg/dL    Comment: Performed at [facility], [address], [place], [place] [address]  Phosphorus     Status: None   Collection Time: 08/04/23  4:15 PM  Result Value Ref Range   Phosphorus 4.1 2.5 - 4.6 mg/dL    Comment: Performed at [facility], [address] [place], [place] [address]  Cooxemetry Panel (carboxy, met, total hgb, O2 sat)     Status: Abnormal   Collection Time: 08/04/23  4:15 PM  Result Value Ref Range   Total hemoglobin 8.4 (L) 12.0 - 16.0 g/dL   O2 Saturation 11.9 %   Carboxyhemoglobin 2.1 (H) 0.5 - 1.5 %   Methemoglobin <0.7 0.0 - 1.5 %    Comment: Performed at [facility], [address] [place], [place] [address]  Glucose, capillary     Status: Abnormal   Collection Time: 08/04/23  7:29 PM  Result Value Ref Range   Glucose-Capillary 183 (H) 70 - 99 mg/dL    Comment: Glucose reference range applies only to samples taken after fasting for at least 8 hours.  Heparin level (unfractionated)     Status: Abnormal   Collection Time: 08/04/23  8:36 PM  Result Value Ref Range   Heparin Unfractionated 0.74 (H) 0.30 - 0.70 IU/mL    Comment: (NOTE) The clinical reportable range upper limit is being lowered to >1.10 to align with the FDA approved guidance for the current laboratory assay.  If heparin results are below expected values, and patient dosage has  been confirmed, suggest follow up testing of antithrombin III levels. Performed at [facility], [address], [place], [place] [address]   Cooxemetry Panel (carboxy, met, total hgb, O2 sat)     Status: Abnormal   Collection Time: 08/04/23  8:36 PM  Result Value Ref Range   Total hemoglobin 8.8 (L) 12.0 - 16.0 g/dL   O2 Saturation 30.8 %   Carboxyhemoglobin 1.7 (H) 0.5 - 1.5 %   Methemoglobin 0.7 0.0 - 1.5 %    Comment: Performed at [facility], [address], [place], [place] [address]  Glucose, capillary     Status: Abnormal   Collection Time: 08/04/23 11:25 PM   Result Value Ref Range   Glucose-Capillary 188 (H) 70 - 99 mg/dL    Comment: Glucose reference range applies only to samples taken after fasting for at least 8 hours.  Glucose, capillary     Status: Abnormal  Collection Time: 08/05/23  3:18 AM  Result Value Ref Range   Glucose-Capillary 181 (H) 70 - 99 mg/dL    Comment: Glucose reference range applies only to samples taken after fasting for at least 8 hours.  Magnesium     Status: Abnormal   Collection Time: 08/05/23  4:24 AM  Result Value Ref Range   Magnesium 1.1 (L) 1.7 - 2.4 mg/dL    Comment: Performed at [facility], [address], [place], [place] [address]  Phosphorus     Status: None   Collection Time: 08/05/23  4:24 AM  Result Value Ref Range   Phosphorus 3.9 2.5 - 4.6 mg/dL    Comment: Performed at [facility], [address] [place], [place] [address]  Basic metabolic panel     Status: Abnormal   Collection Time: 08/05/23  4:24 AM  Result Value Ref Range   Sodium 135 135 - 145 mmol/L   Potassium 3.4 (L) 3.5 - 5.1 mmol/L   Chloride 97 (L) 98 - 111 mmol/L   CO2 24 22 - 32 mmol/L   Glucose, Bld 205 (H) 70 - 99 mg/dL    Comment: Glucose reference range applies only to samples taken after fasting for at least 8 hours.   BUN 34 (H) 8 - 23 mg/dL   Creatinine, Ser 3.22 (H) 0.44 - 1.00 mg/dL   Calcium 7.7 (L) 8.9 - 10.3 mg/dL   GFR, Estimated 33 (L) >60 mL/min    Comment: (NOTE) Calculated using the CKD-EPI Creatinine Equation (2021)    Anion gap 14 5 - 15    Comment: Performed at [facility], [address], [place], [place] [address]  Cooxemetry Panel (carboxy, met, total hgb, O2 sat)     Status: Abnormal   Collection Time: 08/05/23  4:24 AM  Result Value Ref Range   Total hemoglobin 7.7 (L) 12.0 - 16.0 g/dL   O2 Saturation 70.6 %   Carboxyhemoglobin 1.1 0.5 - 1.5 %   Methemoglobin 1.0 0.0 - 1.5 %    Comment: Performed at [facility], [address], [place], [place] [address]  Hepatic  function panel     Status: Abnormal   Collection Time: 08/05/23  4:24 AM  Result Value Ref Range   Total Protein 4.5 (L) 6.5 - 8.1 g/dL   Albumin 2.4 (L) 3.5 - 5.0 g/dL   AST 831 (H) 15 - 41 U/L   ALT 208 (H) 0 - 44 U/L   Alkaline Phosphatase 51 38 - 126 U/L   Total Bilirubin 0.9 0.3 - 1.2 mg/dL   Bilirubin, Direct 0.2 0.0 - 0.2 mg/dL   Indirect Bilirubin 0.7 0.3 - 0.9 mg/dL    Comment: Performed at [facility], [address], [place], [place] [address]  Heparin level (unfractionated)     Status: Abnormal   Collection Time: 08/05/23  6:01 AM  Result Value Ref Range   Heparin Unfractionated 0.71 (H) 0.30 - 0.70 IU/mL    Comment: (NOTE) The clinical reportable range upper limit is being lowered to >1.10 to align with the FDA approved guidance for the current laboratory assay.  If heparin results are below expected values, and patient dosage has  been confirmed, suggest follow up testing of antithrombin III levels. Performed at [facility], [address] [place], [place] [address]   CBC     Status: Abnormal   Collection Time: 08/05/23  6:01 AM  Result Value  Ref Range   WBC 9.2 4.0 - 10.5 K/uL    Comment: WHITE COUNT CONFIRMED ON SMEAR   RBC 2.07 (L) 3.87 - 5.11 MIL/uL   Hemoglobin 7.2 (L) 12.0 - 15.0 g/dL   HCT 40.9 (L) 81.1 - 91.4 %   MCV 101.9 (H) 80.0 - 100.0 fL   MCH 34.8 (H) 26.0 - 34.0 pg   MCHC 34.1 30.0 - 36.0 g/dL   RDW 78.2 95.6 - 21.3 %   Platelets 166 150 - 400 K/uL   nRBC 6.4 (H) 0.0 - 0.2 %    Comment: Performed at [facility], [address] [place], [place] [address]  Glucose, capillary     Status: Abnormal   Collection Time: 08/05/23  7:26 AM  Result Value Ref Range   Glucose-Capillary 209 (H) 70 - 99 mg/dL    Comment: Glucose reference range applies only to samples taken after fasting for at least 8 hours.  I-STAT 7, (LYTES, BLD GAS, ICA, H+H)     Status: Abnormal   Collection Time: 08/05/23  7:56 AM  Result Value Ref Range    pH, Arterial 7.445 7.35 - 7.45   pCO2 arterial 36.1 32 - 48 mmHg   pO2, Arterial 129 (H) 83 - 108 mmHg   Bicarbonate 24.7 20.0 - 28.0 mmol/L   TCO2 26 22 - 32 mmol/L   O2 Saturation 99 %   Acid-Base Excess 1.0 0.0 - 2.0 mmol/L   Sodium 132 (L) 135 - 145 mmol/L   Potassium 3.6 3.5 - 5.1 mmol/L   Calcium, Ion 1.01 (L) 1.15 - 1.40 mmol/L   HCT 22.0 (L) 36.0 - 46.0 %   Hemoglobin 7.5 (L) 12.0 - 15.0 g/dL   Patient temperature 84.6 C    Collection site RADIAL, ALLEN'S TEST ACCEPTABLE    Drawn by RT    Sample type ARTERIAL   Lactic acid, plasma     Status: None   Collection Time: 08/05/23  8:12 AM  Result Value Ref Range   Lactic Acid, Venous 1.1 0.5 - 1.9 mmol/L    Comment: Performed at [facility], [address], [place], [place] [address]  Glucose, capillary     Status: Abnormal   Collection Time: 08/05/23 11:11 AM  Result Value Ref Range   Glucose-Capillary 193 (H) 70 - 99 mg/dL    Comment: Glucose reference range applies only to samples taken after fasting for at least 8 hours.  Factor 5 leiden     Status: None   Collection Time: 08/05/23  2:22 PM  Result Value Ref Range   Recommendations-F5LEID: Comment     Comment: (NOTE) Result: c.1601G>A (p.Arg534Gln) - Not Detected This result is not associated with an increased risk for venous thromboembolism. See Additional Clinical Information and Comments. Additional Clinical Information: Venous thromboembolism is a multifactorial disease influenced by genetic, environmental, and circumstantial risk factors. The c.1601G>A (p. Arg534Gln) variant in the F5 gene, commonly referred to as Factor V Leiden, is a genetic risk factor for venous thromboembolism. Heterozygous carriers of this variant have a 6- to 8- fold increased risk for venous thromboembolism. Individuals homozygous for this variant (ie, with a copy of the variant on each chromosome) have an approximately 80-fold increased risk for venous thromboembolism.  Individuals who carry both a c.*97G>A variant in the F2 gene and Factor V Leiden have an approximately 20-fold increased risk for venous thromboembolism. Risks are likely to be even higher in more complex genotype combinations in volving the  F2 c.*97G>A variant and Factor V Leiden (PMID: 54098119). Additional risk factors include but are not limited to: deficiency of protein C, protein S, or antithrombin III, age, female sex, personal or family history of deep vein thromboembolism, smoking, surgery, prolonged immobilization, malignant neoplasm, tamoxifen treatment, raloxifene treatment, oral contraceptive use, hormone replacement therapy, and pregnancy. Management of thrombotic risk and thrombotic events should follow established guidelines and fit the clinical circumstance. This result cannot predict the occurrence or recurrence of a thrombotic event. Comment: Genetic counseling is recommended to discuss the potential clinical implications of positive results, as well as recommendations for testing family members. Genetic Coordinators are available for health care providers to discuss results at [phone]). Test Details: Variant Analyzed: c.1601G>A (p. Arg534Gln), referred to as Fact or V Leiden Methods/Limitations: DNA analysis of the F5 gene (NM_000130.5) was performed by PCR amplification followed by restriction enzyme analysis. The diagnostic sensitivity is >99%. Results must be combined with clinical information for the most accurate interpretation. Molecular- based testing is highly accurate, but as in any laboratory test, diagnostic errors may occur. False positive or false negative results may occur for reasons that include genetic variants, blood transfusions, bone marrow transplantation, somatic or tissue-specific mosaicism, mislabeled samples, or erroneous representation of family relationships. This test was developed and its performance  characteristics determined by [facility]. It has not been cleared or approved by the Food and Drug Administration. References: Dewitt Hoes [facility], Valentina Lucks [facility]; ACMG Professional Practice and Guidelines Committee. Addendum: [employer] of Medical Genetics consensus statement on fac tor V Leiden mutation testing. Genet Med. 2021 Mar 5. doi: 29.5621/H08657-846- 01108-x. PMID: 96295284. Cherrie Gauze. Factor V Leiden Thrombophilia. 1999 May 14 (Updated 2018 Jan 4). In: Bufford Buttner, Ardinger HH, Pagon RA, et al., editors. GeneReviews(R) (Internet). [address] ([place]): [place], [place]; 1324-4010. Available from: [email or web address] Nita Sickle, Blanca Friend, Alvie Heidelberg CS; ACMG Laboratory Quality Assurance Committee. Venous thromboembolism laboratory testing (factor V Leiden and factor II c.*97G>A), 2018 update: a technical standard of the [employer] of [employer] and [employer] (ACMG). Genet Med. 2018 Dec;20(12):1489-1498. doi: 10.1038/[id number]-z. Epub 2018 Oct 5. PMID: 27253664.    Reviewed By: Comment     Comment: (NOTE) Technical Component performed at [employer] [facility] Professional Component performed by: [employer]. Redmond Baseman, Ph.D., [facility] Director, Molecular Genetics [address] [place] [address] Performed At: [facility] [address] [place], [place] [id number] Maurine Simmering MDPhD FI:[id number]   Prothrombin gene mutation     Status: None   Collection Time: 08/05/23  2:22 PM  Result Value Ref Range   Recommendations-PTGENE: Comment     Comment: (NOTE) Result: c.*97G>A - Not Detected This result is not associated with an increased risk for venous thromboembolism. See Additional Clinical Information and Comments. Additional Clinical Information: Venous thromboembolism is a multifactorial disease influenced by genetic, environmental,  and circumstantial risk factors. The c.*97G>A variant in the F2 gene is a genetic risk factor for venous thromboembolism. Heterozygous carriers have a 2- to 4-fold increased risk for venous thromboembolism. Homozygotes for the c.*97G>A variant are rare. The annual risk of VTE in homozygotes has been reported to be 1.1%/year. Individuals who carry both a c.*97G>A variant in the F2 gene and a c.1601G>A (p. Arg534Gln) variant in the F5 gene (commonly referred to as Factor V Leiden) have an approximately 20- fold increased risk for venous thromboembolism. Risks are likely to be even higher  in more complex genotype combinations involving the F2 c.*97G>A variant and Factor V Leiden (PMID:  16109604). Additional risk factors include but are not limited to: deficiency of protein C, protein S, or antithrombin III, age, female sex, personal or family history of deep vein thromboembolism, smoking, surgery, prolonged immobilization, malignant neoplasm, tamoxifen treatment, raloxifene treatment, oral contraceptive use, hormone replacement therapy, and pregnancy. Management of thrombotic risk and thrombotic events should follow established guidelines and fit the clinical circumstance. This result cannot predict the occurrence or recurrence of a thrombotic event. Comments: Genetic counseling is recommended to discuss the potential clinical implications of positive results, as well as recommendations for testing family members. Genetic Coordinators are available for health care providers to discuss results at [phone] Test Details: Variant analyzed: c.*97G>A, previously referred to as G20210A Methods/Limitations: DNA analysis of the F2 gene (NM_000 506.5) was performed by PCR amplification followed by restriction enzyme analysis. The diagnostic sensitivity is >99%. Results must be combined with clinical information for the most accurate interpretation. Molecular-based testing is highly  accurate, but as in any laboratory test, diagnostic errors may occur. False positive or false negative results may occur for reasons that include genetic variants, blood transfusions, bone marrow transplantation, somatic or tissue-specific mosaicism, mislabeled samples, or erroneous representation of family relationships. This test was developed and its performance characteristics determined by [facility]. It has not been cleared or approved by the Food and Drug Administration. References: Dewitt Hoes [facility], Valentina Lucks [facility]; ACMG Professional Practice and Guidelines Committee. Addendum: [employer] of Medical Genetics consensus statement on factor V Leiden mutation testing. Genet Med. 2021 Mar 5. doi: 81.1914/N829 [id number]-x. PMID: 56213086. Cherrie Gauze. Prothrombin Thrombophilia. 2006 Jul 25 [Updated 2021 Feb 4]. In: Bufford Buttner, Ardinger HH, Pagon RA, et al., editors. GeneReviews(R) [Internet]. [address] ([place]): [place], [place]; 5784-6962. Available from: [email or web address]/ Nita Sickle, Blanca Friend, Alvie Heidelberg CS; ACMG Laboratory Quality Assurance Committee. Venous thromboembolism laboratory testing (factor V Leiden and factor II c.*97G>A), 2018 update: a technical standard of the [employer] of [employer] and [employer] (ACMG). Genet Med. 2018 Dec;20(12):1489-1498. doi: 10.1038/[id number]-z. Epub 2018 Oct 5. PMID: 95284132.    Reviewed by: Comment     Comment: (NOTE) Technical Component performed at [employer] [facility] Professional Component performed by: [employer], Ph.D., [facility] Director, Molecular Genetics [address] [place] [address] Performed At: [employer] [facility] [address] [place], [place] [id number] Maurine Simmering MDPhD IH:[id number]   Lupus anticoagulant panel     Status: Abnormal   Collection Time: 08/05/23  2:22 PM  Result  Value Ref Range   PTT Lupus Anticoagulant 51.3 (H) 0.0 - 43.5 sec    Comment: (NOTE) Additional testing confirms the presence of heparin in the test sample. Results obtained after heparin neutralization.    DRVVT 56.5 (H) 0.0 - 47.0 sec   Lupus Anticoag Interp Comment:     Comment: (NOTE) No lupus anticoagulant was detected. Mixing studies suggest the presence of an inhibitor. It should be noted that mixing studies performed on samples with minimally extended aPTT results can be equivocal.  Normal plasma can overcome weak inhibitors, also resulting in a correction of the mixing study.  The pattern observed could be caused by a specific factor inhibitor, dabigatran (a direct thrombin inhibitor anticoagulant), or a direct Xa inhibitor anticoagulant therapy such as rivaroxaban, apixaban or edoxaban. As antibody titers may fluctuate with time, repeat testing may  be indicated and ideally should be performed in the absence of anticoagulant therapy. Performed At: [facility] [address] [place], [place] [id number] Jolene Schimke MD WU:[id number]   Cardiolipin antibodies, IgM+IgG     Status: None   Collection Time: 08/05/23  2:22 PM  Result Value Ref Range   Anticardiolipin IgG <9 0 - 14 GPL U/mL    Comment: (NOTE)                          Negative:              <15                          Indeterminate:     15 - 20                          Low-Med Positive: >20 - 80                          High Positive:         >80    Anticardiolipin IgM <9 0 - 12 MPL U/mL    Comment: (NOTE)                          Negative:              <13                          Indeterminate:     13 - 20                          Low-Med Positive: >20 - 80                          High Positive:         >80 Performed At: [facility] [address] [place], [place] [id number] Jolene Schimke MD VH:[id number]   PTT-LA Mix     Status: Abnormal   Collection Time: 08/05/23  2:22 PM   Result Value Ref Range   PTT-LA Mix 41.8 (H) 0.0 - 40.5 sec    Comment: (NOTE) Performed At: [facility] [address] [place], [place] [id number] Jolene Schimke MD [id number]   Hexagonal Phase Phospholipid     Status: None   Collection Time: 08/05/23  2:22 PM  Result Value Ref Range   Hexagonal Phase Phospholipid 5 0 - 11 sec    Comment: (NOTE) Performed At: [facility] [address] [place], [place] [id number] Jolene Schimke MD VF:[id number]   dRVVT Mix     Status: Abnormal   Collection Time: 08/05/23  2:22 PM  Result Value Ref Range   dRVVT Mix 43.1 (H) 0.0 - 40.4 sec    Comment: (NOTE) Performed At: [facility] [address] [place], [place] [id number] Jolene Schimke MD [id number]   dRVVT Confirm     Status: None   Collection Time: 08/05/23  2:22 PM  Result Value Ref Range   dRVVT Confirm 1.0 0.8 - 1.2 ratio    Comment: (NOTE) Performed At: [facility] [address] [place], [place] [id number] Jolene Schimke MD WC:[id number]   Glucose, capillary     Status: Abnormal  Collection Time: 08/05/23  3:19 PM  Result Value Ref Range   Glucose-Capillary 163 (H) 70 - 99 mg/dL    Comment: Glucose reference range applies only to samples taken after fasting for at least 8 hours.  Heparin level (unfractionated)     Status: Abnormal   Collection Time: 08/05/23  4:16 PM  Result Value Ref Range   Heparin Unfractionated 0.88 (H) 0.30 - 0.70 IU/mL    Comment: (NOTE) The clinical reportable range upper limit is being lowered to >1.10 to align with the FDA approved guidance for the current laboratory assay.  If heparin results are below expected values, and patient dosage has  been confirmed, suggest follow up testing of antithrombin III levels. Performed at [facility], [address], [place], [place] [address]   Magnesium     Status: Abnormal   Collection Time: 08/05/23  4:16 PM  Result Value Ref Range   Magnesium 2.9  (H) 1.7 - 2.4 mg/dL    Comment: Performed at [facility], [address] [place], [place] [address]  Phosphorus     Status: None   Collection Time: 08/05/23  4:16 PM  Result Value Ref Range   Phosphorus 3.7 2.5 - 4.6 mg/dL    Comment: Performed at [facility], [address] [place], [place] [address]  Glucose, capillary     Status: Abnormal   Collection Time: 08/05/23  7:35 PM  Result Value Ref Range   Glucose-Capillary 146 (H) 70 - 99 mg/dL    Comment: Glucose reference range applies only to samples taken after fasting for at least 8 hours.  magnesium level     Status: Abnormal   Collection Time: 08/05/23  8:28 PM  Result Value Ref Range   Magnesium 2.9 (H) 1.7 - 2.4 mg/dL    Comment: Performed at [facility], [address] [place], [place] [address]  Glucose, capillary     Status: Abnormal   Collection Time: 08/05/23 11:10 PM  Result Value Ref Range   Glucose-Capillary 190 (H) 70 - 99 mg/dL    Comment: Glucose reference range applies only to samples taken after fasting for at least 8 hours.  Heparin level (unfractionated)     Status: None   Collection Time: 08/06/23  1:41 AM  Result Value Ref Range   Heparin Unfractionated 0.51 0.30 - 0.70 IU/mL    Comment: (NOTE) The clinical reportable range upper limit is being lowered to >1.10 to align with the FDA approved guidance for the current laboratory assay.  If heparin results are below expected values, and patient dosage has  been confirmed, suggest follow up testing of antithrombin III levels. Performed at [facility], [address], [place], [place] [address]   Glucose, capillary     Status: Abnormal   Collection Time: 08/06/23  3:39 AM  Result Value Ref Range   Glucose-Capillary 185 (H) 70 - 99 mg/dL    Comment: Glucose reference range applies only to samples taken after fasting for at least 8 hours.  Glucose, capillary     Status: Abnormal   Collection Time: 08/06/23  6:01 AM  Result  Value Ref Range   Glucose-Capillary 174 (H) 70 - 99 mg/dL    Comment: Glucose reference range applies only to samples taken after fasting for at least 8 hours.  CBC     Status: Abnormal   Collection Time: 08/06/23  6:30 AM  Result Value Ref Range   WBC  6.2 4.0 - 10.5 K/uL   RBC 1.37 (L) 3.87 - 5.11 MIL/uL   Hemoglobin 4.9 (LL) 12.0 - 15.0 g/dL    Comment: REPEATED TO VERIFY THIS CRITICAL RESULT HAS VERIFIED AND BEEN CALLED TO B BUCKNER RN BY BONNIE DAVIS ON 09 21 2024 AT 0730, AND HAS BEEN READ BACK.     HCT 14.5 (L) 36.0 - 46.0 %   MCV 105.8 (H) 80.0 - 100.0 fL   MCH 35.8 (H) 26.0 - 34.0 pg   MCHC 33.8 30.0 - 36.0 g/dL   RDW 40.9 81.1 - 91.4 %   Platelets 100 (L) 150 - 400 K/uL   nRBC 8.9 (H) 0.0 - 0.2 %    Comment: Performed at [facility], [address], [place], [place] [address]  Magnesium     Status: Abnormal   Collection Time: 08/06/23  6:30 AM  Result Value Ref Range   Magnesium 2.7 (H) 1.7 - 2.4 mg/dL    Comment: Performed at [facility], [address] [address], [place], [place] [address]  Phosphorus     Status: None   Collection Time: 08/06/23  6:30 AM  Result Value Ref Range   Phosphorus 3.2 2.5 - 4.6 mg/dL    Comment: Performed at [facility], [address] [place], [place] [address]  Basic metabolic panel     Status: Abnormal   Collection Time: 08/06/23  6:30 AM  Result Value Ref Range   Sodium 134 (L) 135 - 145 mmol/L   Potassium 3.5 3.5 - 5.1 mmol/L   Chloride 99 98 - 111 mmol/L   CO2 24 22 - 32 mmol/L   Glucose, Bld 169 (H) 70 - 99 mg/dL    Comment: Glucose reference range applies only to samples taken after fasting for at least 8 hours.   BUN 33 (H) 8 - 23 mg/dL   Creatinine, Ser 4.69 (H) 0.44 - 1.00 mg/dL   Calcium 7.6 (L) 8.9 - 10.3 mg/dL   GFR, Estimated 35 (L) >60 mL/min    Comment: (NOTE) Calculated using the CKD-EPI Creatinine Equation (2021)    Anion gap 11 5 - 15    Comment: Performed at [facility], [address],  [place], [place] [address]  Cooxemetry Panel (carboxy, met, total hgb, O2 sat)     Status: Abnormal   Collection Time: 08/06/23  7:42 AM  Result Value Ref Range   Total hemoglobin 7.0 (L) 12.0 - 16.0 g/dL   O2 Saturation 84.1 %   Carboxyhemoglobin 1.2 0.5 - 1.5 %   Methemoglobin 1.5 0.0 - 1.5 %    Comment: Performed at [facility], [address], [place], [place] [address]  Hemoglobin and hematocrit, blood     Status: Abnormal   Collection Time: 08/06/23  7:42 AM  Result Value Ref Range   Hemoglobin 6.8 (LL) 12.0 - 15.0 g/dL    Comment: CRITICAL VALUE NOTED.  VALUE IS CONSISTENT WITH PREVIOUSLY REPORTED AND CALLED VALUE. REPEATED TO VERIFY    HCT 19.5 (L) 36.0 - 46.0 %    Comment: Performed at [facility], [address], [place], [place] [address]  Glucose, capillary     Status: Abnormal   Collection Time: 08/06/23  8:07 AM  Result Value Ref Range   Glucose-Capillary 164 (H) 70 - 99 mg/dL    Comment: Glucose reference range applies only to samples taken after fasting for at least 8 hours.  Prepare RBC (crossmatch)  Status: None   Collection Time: 08/06/23  9:00 AM  Result Value Ref Range   Order Confirmation      ORDER PROCESSED BY BLOOD BANK Performed at [facility], [address] [place], [place] [address]   Glucose, capillary     Status: Abnormal   Collection Time: 08/06/23 10:33 AM  Result Value Ref Range   Glucose-Capillary 143 (H) 70 - 99 mg/dL    Comment: Glucose reference range applies only to samples taken after fasting for at least 8 hours.  Glucose, capillary     Status: Abnormal   Collection Time: 08/06/23 11:39 AM  Result Value Ref Range   Glucose-Capillary 119 (H) 70 - 99 mg/dL    Comment: Glucose reference range applies only to samples taken after fasting for at least 8 hours.  CBC     Status: Abnormal   Collection Time: 08/06/23 12:52 PM  Result Value Ref Range   WBC 7.2 4.0 - 10.5 K/uL   RBC 2.39 (L) 3.87 - 5.11 MIL/uL   Hemoglobin  8.2 (L) 12.0 - 15.0 g/dL    Comment: REPEATED TO VERIFY POST TRANSFUSION SPECIMEN    HCT 23.2 (L) 36.0 - 46.0 %   MCV 97.1 80.0 - 100.0 fL    Comment: POST TRANSFUSION SPECIMEN DELTA CHECK NOTED    MCH 34.3 (H) 26.0 - 34.0 pg   MCHC 35.3 30.0 - 36.0 g/dL   RDW 95.6 (H) 21.3 - 08.6 %   Platelets 128 (L) 150 - 400 K/uL    Comment: REPEATED TO VERIFY   nRBC 10.0 (H) 0.0 - 0.2 %    Comment: Performed at [facility], [address], [place], [place] [address]  Hepatic function panel     Status: Abnormal   Collection Time: 08/06/23  1:59 PM  Result Value Ref Range   Total Protein 5.1 (L) 6.5 - 8.1 g/dL   Albumin 2.6 (L) 3.5 - 5.0 g/dL   AST 962 (H) 15 - 41 U/L   ALT 116 (H) 0 - 44 U/L   Alkaline Phosphatase 51 38 - 126 U/L   Total Bilirubin 1.1 0.3 - 1.2 mg/dL   Bilirubin, Direct 0.3 (H) 0.0 - 0.2 mg/dL   Indirect Bilirubin 0.8 0.3 - 0.9 mg/dL    Comment: Performed at [facility], [address] [place], [place] [address]  Glucose, capillary     Status: Abnormal   Collection Time: 08/06/23  3:31 PM  Result Value Ref Range   Glucose-Capillary 204 (H) 70 - 99 mg/dL    Comment: Glucose reference range applies only to samples taken after fasting for at least 8 hours.  Glucose, capillary     Status: Abnormal   Collection Time: 08/06/23  7:13 PM  Result Value Ref Range   Glucose-Capillary 147 (H) 70 - 99 mg/dL    Comment: Glucose reference range applies only to samples taken after fasting for at least 8 hours.  Heparin level (unfractionated)     Status: Abnormal   Collection Time: 08/06/23 10:04 PM  Result Value Ref Range   Heparin Unfractionated 0.19 (L) 0.30 - 0.70 IU/mL    Comment: (NOTE) The clinical reportable range upper limit is being lowered to >1.10 to align with the FDA approved guidance for the current laboratory assay.  If heparin results are below expected values, and patient dosage has  been confirmed, suggest follow up testing of antithrombin III  levels. Performed at [facility], [address] [place], [place] [address]   Glucose, capillary     Status: Abnormal   Collection Time: 08/06/23 11:12 PM  Result Value Ref Range   Glucose-Capillary 112 (H) 70 - 99 mg/dL    Comment: Glucose reference range applies only to samples taken after fasting for at least 8 hours.  Glucose, capillary     Status: Abnormal   Collection Time: 08/07/23  1:50 AM  Result Value Ref Range   Glucose-Capillary 124 (H) 70 - 99 mg/dL    Comment: Glucose reference range applies only to samples taken after fasting for at least 8 hours.  Glucose, capillary     Status: Abnormal   Collection Time: 08/07/23  3:31 AM  Result Value Ref Range   Glucose-Capillary 134 (H) 70 - 99 mg/dL    Comment: Glucose reference range applies only to samples taken after fasting for at least 8 hours.  CBC     Status: Abnormal   Collection Time: 08/07/23  4:28 AM  Result Value Ref Range   WBC 6.4 4.0 - 10.5 K/uL   RBC 2.21 (L) 3.87 - 5.11 MIL/uL   Hemoglobin 7.5 (L) 12.0 - 15.0 g/dL   HCT 53.6 (L) 64.4 - 03.4 %   MCV 99.1 80.0 - 100.0 fL   MCH 33.9 26.0 - 34.0 pg   MCHC 34.2 30.0 - 36.0 g/dL   RDW 74.2 (H) 59.5 - 63.8 %   Platelets 114 (L) 150 - 400 K/uL   nRBC 6.9 (H) 0.0 - 0.2 %    Comment: Performed at [facility], [address] [place], [place] [address]  Cooxemetry Panel (carboxy, met, total hgb, O2 sat)     Status: Abnormal   Collection Time: 08/07/23  4:28 AM  Result Value Ref Range   Total hemoglobin 8.1 (L) 12.0 - 16.0 g/dL   O2 Saturation 32.9 %   Carboxyhemoglobin 2.0 (H) 0.5 - 1.5 %   Methemoglobin <0.7 0.0 - 1.5 %    Comment: Performed at [facility], [address], [place], [place] [address]  Hepatic function panel     Status: Abnormal   Collection Time: 08/07/23  4:28 AM  Result Value Ref Range   Total Protein 4.7 (L) 6.5 - 8.1 g/dL   Albumin 2.4 (L) 3.5 - 5.0 g/dL   AST 72 (H) 15 - 41 U/L   ALT 91 (H) 0 - 44 U/L   Alkaline  Phosphatase 45 38 - 126 U/L   Total Bilirubin 1.1 0.3 - 1.2 mg/dL   Bilirubin, Direct 0.2 0.0 - 0.2 mg/dL   Indirect Bilirubin 0.9 0.3 - 0.9 mg/dL    Comment: Performed at [facility], [address], [place], [place] [address]  Basic metabolic panel     Status: Abnormal   Collection Time: 08/07/23  4:28 AM  Result Value Ref Range   Sodium 142 135 - 145 mmol/L   Potassium 4.0 3.5 - 5.1 mmol/L   Chloride 103 98 - 111 mmol/L   CO2 29 22 - 32 mmol/L   Glucose, Bld 127 (H) 70 - 99 mg/dL    Comment: Glucose reference range applies only to samples taken after fasting for at least 8 hours.   BUN 25 (H) 8 - 23 mg/dL   Creatinine, Ser 3.01 (H) 0.44 - 1.00 mg/dL   Calcium 8.0 (L) 8.9 - 10.3 mg/dL   GFR, Estimated 48 (L) >60 mL/min    Comment: (NOTE) Calculated using the  CKD-EPI Creatinine Equation (2021)    Anion gap 10 5 - 15    Comment: Performed at [facility], [address] [place], [place] [address]  Magnesium     Status: Abnormal   Collection Time: 08/07/23  4:28 AM  Result Value Ref Range   Magnesium 2.9 (H) 1.7 - 2.4 mg/dL    Comment: Performed at [facility], [address], [place], [place] [address]  Phosphorus     Status: None   Collection Time: 08/07/23  4:28 AM  Result Value Ref Range   Phosphorus 3.3 2.5 - 4.6 mg/dL    Comment: Performed at [facility], [address], [place], [place] [address]  Heparin level (unfractionated)     Status: Abnormal   Collection Time: 08/07/23  5:52 AM  Result Value Ref Range   Heparin Unfractionated 0.26 (L) 0.30 - 0.70 IU/mL    Comment: (NOTE) The clinical reportable range upper limit is being lowered to >1.10 to align with the FDA approved guidance for the current laboratory assay.  If heparin results are below expected values, and patient dosage has  been confirmed, suggest follow up testing of antithrombin III levels. Performed at [facility], [address] [place], [place] [address]    Glucose, capillary     Status: None   Collection Time: 08/07/23  7:31 AM  Result Value Ref Range   Glucose-Capillary 98 70 - 99 mg/dL    Comment: Glucose reference range applies only to samples taken after fasting for at least 8 hours.  Glucose, capillary     Status: Abnormal   Collection Time: 08/07/23 11:19 AM  Result Value Ref Range   Glucose-Capillary 117 (H) 70 - 99 mg/dL    Comment: Glucose reference range applies only to samples taken after fasting for at least 8 hours.  Glucose, capillary     Status: Abnormal   Collection Time: 08/07/23  2:59 PM  Result Value Ref Range   Glucose-Capillary 181 (H) 70 - 99 mg/dL    Comment: Glucose reference range applies only to samples taken after fasting for at least 8 hours.  Heparin level (unfractionated)     Status: None   Collection Time: 08/07/23  3:49 PM  Result Value Ref Range   Heparin Unfractionated 0.46 0.30 - 0.70 IU/mL    Comment: (NOTE) The clinical reportable range upper limit is being lowered to >1.10 to align with the FDA approved guidance for the current laboratory assay.  If heparin results are below expected values, and patient dosage has  been confirmed, suggest follow up testing of antithrombin III levels. Performed at [facility], [address] [place], [place] [address]   Glucose, capillary     Status: Abnormal   Collection Time: 08/07/23  7:10 PM  Result Value Ref Range   Glucose-Capillary 132 (H) 70 - 99 mg/dL    Comment: Glucose reference range applies only to samples taken after fasting for at least 8 hours.  Glucose, capillary     Status: Abnormal   Collection Time: 08/07/23 11:18 PM  Result Value Ref Range   Glucose-Capillary 102 (H) 70 - 99 mg/dL    Comment: Glucose reference range applies only to samples taken after fasting for at least 8 hours.  Glucose, capillary     Status: None   Collection Time: 08/08/23  3:43 AM  Result Value Ref Range   Glucose-Capillary 93 70 - 99 mg/dL    Comment:  Glucose reference range applies only to samples taken after fasting for at least 8 hours.  CBC     Status: Abnormal   Collection Time: 08/08/23  3:45 AM  Result Value Ref Range   WBC 7.3 4.0 - 10.5 K/uL   RBC 2.17 (L) 3.87 - 5.11 MIL/uL   Hemoglobin 7.4 (L) 12.0 - 15.0 g/dL   HCT 16.1 (L) 09.6 - 04.5 %   MCV 105.1 (H) 80.0 - 100.0 fL   MCH 34.1 (H) 26.0 - 34.0 pg   MCHC 32.5 30.0 - 36.0 g/dL   RDW 40.9 (H) 81.1 - 91.4 %   Platelets 135 (L) 150 - 400 K/uL   nRBC 3.4 (H) 0.0 - 0.2 %    Comment: Performed at [facility], [address], [place], [place] [address]  Basic metabolic panel     Status: Abnormal   Collection Time: 08/08/23  3:45 AM  Result Value Ref Range   Sodium 143 135 - 145 mmol/L   Potassium 3.6 3.5 - 5.1 mmol/L   Chloride 103 98 - 111 mmol/L   CO2 33 (H) 22 - 32 mmol/L   Glucose, Bld 86 70 - 99 mg/dL    Comment: Glucose reference range applies only to samples taken after fasting for at least 8 hours.   BUN 17 8 - 23 mg/dL   Creatinine, Ser 6.21 0.44 - 1.00 mg/dL   Calcium 8.0 (L) 8.9 - 10.3 mg/dL   GFR, Estimated >30 >86 mL/min    Comment: (NOTE) Calculated using the CKD-EPI Creatinine Equation (2021)    Anion gap 7 5 - 15    Comment: Performed at [facility], [address], [place], [place] [address]  Magnesium     Status: Abnormal   Collection Time: 08/08/23  3:45 AM  Result Value Ref Range   Magnesium 2.7 (H) 1.7 - 2.4 mg/dL    Comment: Performed at [facility], [address] [place], [place] [address]  Phosphorus     Status: None   Collection Time: 08/08/23  3:45 AM  Result Value Ref Range   Phosphorus 3.3 2.5 - 4.6 mg/dL    Comment: Performed at [facility], [address], [place], [place] [address]  Heparin level (unfractionated)     Status: None   Collection Time: 08/08/23  3:45 AM  Result Value Ref Range   Heparin Unfractionated 0.52 0.30 - 0.70 IU/mL    Comment: (NOTE) The clinical reportable range upper limit is  being lowered to >1.10 to align with the FDA approved guidance for the current laboratory assay.  If heparin results are below expected values, and patient dosage has  been confirmed, suggest follow up testing of antithrombin III levels. Performed at [facility], [address], [place], [place] [address]   Brain natriuretic peptide     Status: Abnormal   Collection Time: 08/08/23  3:45 AM  Result Value Ref Range   B Natriuretic Peptide 374.2 (H) 0.0 - 100.0 pg/mL    Comment: Performed at [facility], [address], [place], [place] [address]  Hepatic function panel     Status: Abnormal   Collection Time: 08/08/23  3:45 AM  Result Value Ref Range   Total Protein 4.6 (L) 6.5 - 8.1 g/dL   Albumin 2.3 (L) 3.5 - 5.0 g/dL   AST 43 (H) 15 - 41 U/L   ALT 65 (H) 0 - 44 U/L  Alkaline Phosphatase 43 38 - 126 U/L   Total Bilirubin 0.7 0.3 - 1.2 mg/dL   Bilirubin, Direct 0.1 0.0 - 0.2 mg/dL   Indirect Bilirubin 0.6 0.3 - 0.9 mg/dL    Comment: Performed at [facility], [address] [place], [place] [address]  Lactic acid, plasma     Status: None   Collection Time: 08/08/23  6:10 AM  Result Value Ref Range   Lactic Acid, Venous 0.7 0.5 - 1.9 mmol/L    Comment: Performed at [place] [facility], [address], [place], [place] [address]  Glucose, capillary     Status: None   Collection Time: 08/08/23  7:27 AM  Result Value Ref Range   Glucose-Capillary 84 70 - 99 mg/dL    Comment: Glucose reference range applies only to samples taken after fasting for at least 8 hours.  Troponin I (High Sensitivity)     Status: Abnormal   Collection Time: 08/08/23 10:40 AM  Result Value Ref Range   Troponin I (High Sensitivity) 40 (H) <18 ng/L    Comment: (NOTE) Elevated high sensitivity troponin I (hsTnI) values and significant  changes across serial measurements may suggest ACS but many other  chronic and acute conditions are known to elevate hsTnI results.  Refer to the  "Links" section for chest pain algorithms and additional  guidance. Performed at [facility], [address] [place], [place] [address]   Glucose, capillary     Status: Abnormal   Collection Time: 08/08/23 11:46 AM  Result Value Ref Range   Glucose-Capillary 103 (H) 70 - 99 mg/dL    Comment: Glucose reference range applies only to samples taken after fasting for at least 8 hours.  Glucose, capillary     Status: Abnormal   Collection Time: 08/08/23  3:22 PM  Result Value Ref Range   Glucose-Capillary 119 (H) 70 - 99 mg/dL    Comment: Glucose reference range applies only to samples taken after fasting for at least 8 hours.  Glucose, capillary     Status: None   Collection Time: 08/08/23  8:13 PM  Result Value Ref Range   Glucose-Capillary 94 70 - 99 mg/dL    Comment: Glucose reference range applies only to samples taken after fasting for at least 8 hours.  Glucose, capillary     Status: Abnormal   Collection Time: 08/08/23 11:34 PM  Result Value Ref Range   Glucose-Capillary 121 (H) 70 - 99 mg/dL    Comment: Glucose reference range applies only to samples taken after fasting for at least 8 hours.  Glucose, capillary     Status: None   Collection Time: 08/09/23  3:23 AM  Result Value Ref Range   Glucose-Capillary 72 70 - 99 mg/dL    Comment: Glucose reference range applies only to samples taken after fasting for at least 8 hours.  Glucose, capillary     Status: None   Collection Time: 08/09/23  5:26 AM  Result Value Ref Range   Glucose-Capillary 89 70 - 99 mg/dL    Comment: Glucose reference range applies only to samples taken after fasting for at least 8 hours.  Prepare RBC (crossmatch)     Status: None   Collection Time: 08/09/23  7:00 AM  Result Value Ref Range   Order Confirmation      ORDER PROCESSED BY BLOOD BANK Performed at [facility], [address], [place], [place] [address]   Glucose, capillary  Status: None   Collection Time: 08/09/23  7:51 AM   Result Value Ref Range   Glucose-Capillary 94 70 - 99 mg/dL    Comment: Glucose reference range applies only to samples taken after fasting for at least 8 hours.  CBC     Status: Abnormal   Collection Time: 08/09/23  8:38 AM  Result Value Ref Range   WBC 8.3 4.0 - 10.5 K/uL   RBC 2.38 (L) 3.87 - 5.11 MIL/uL   Hemoglobin 8.1 (L) 12.0 - 15.0 g/dL   HCT 52.8 (L) 41.3 - 24.4 %   MCV 108.4 (H) 80.0 - 100.0 fL   MCH 34.0 26.0 - 34.0 pg   MCHC 31.4 30.0 - 36.0 g/dL   RDW 01.0 (H) 27.2 - 53.6 %   Platelets 150 150 - 400 K/uL   nRBC 0.6 (H) 0.0 - 0.2 %    Comment: Performed at [facility], [address], [place], [place] [address]  Magnesium     Status: None   Collection Time: 08/09/23  8:38 AM  Result Value Ref Range   Magnesium 2.1 1.7 - 2.4 mg/dL    Comment: Performed at [facility], [address] [place], [place] [address]  Phosphorus     Status: None   Collection Time: 08/09/23  8:38 AM  Result Value Ref Range   Phosphorus 3.2 2.5 - 4.6 mg/dL    Comment: Performed at [facility], [address], [place], [place] [address]  Heparin level (unfractionated)     Status: None   Collection Time: 08/09/23  8:38 AM  Result Value Ref Range   Heparin Unfractionated 0.44 0.30 - 0.70 IU/mL    Comment: (NOTE) The clinical reportable range upper limit is being lowered to >1.10 to align with the FDA approved guidance for the current laboratory assay.  If heparin results are below expected values, and patient dosage has  been confirmed, suggest follow up testing of antithrombin III levels. Performed at [facility], [address] [place], [place] [address]   Comprehensive metabolic panel     Status: Abnormal   Collection Time: 08/09/23  8:38 AM  Result Value Ref Range   Sodium 140 135 - 145 mmol/L   Potassium 3.7 3.5 - 5.1 mmol/L   Chloride 102 98 - 111 mmol/L   CO2 30 22 - 32 mmol/L   Glucose, Bld 116 (H) 70 - 99 mg/dL    Comment: Glucose reference range  applies only to samples taken after fasting for at least 8 hours.   BUN 12 8 - 23 mg/dL   Creatinine, Ser 3.29 0.44 - 1.00 mg/dL   Calcium 8.2 (L) 8.9 - 10.3 mg/dL   Total Protein 5.1 (L) 6.5 - 8.1 g/dL   Albumin 2.5 (L) 3.5 - 5.0 g/dL   AST 30 15 - 41 U/L   ALT 49 (H) 0 - 44 U/L   Alkaline Phosphatase 47 38 - 126 U/L   Total Bilirubin 0.8 0.3 - 1.2 mg/dL   GFR, Estimated >51 >88 mL/min    Comment: (NOTE) Calculated using the CKD-EPI Creatinine Equation (2021)    Anion gap 8 5 - 15    Comment: Performed at [facility], [address], [place], [place] [address]  Type and screen     Status: None   Collection Time: 08/09/23  8:40 AM  Result Value Ref Range   ABO/RH(D) O POS    Antibody Screen NEG  Sample Expiration      08/12/2023,2359 Performed at [facility], [address] [place], [place] [address]   Glucose, capillary     Status: Abnormal   Collection Time: 08/09/23 11:43 AM  Result Value Ref Range   Glucose-Capillary 140 (H) 70 - 99 mg/dL    Comment: Glucose reference range applies only to samples taken after fasting for at least 8 hours.  ECHOCARDIOGRAM LIMITED     Status: None   Collection Time: 08/09/23  2:35 PM  Result Value Ref Range   Weight 2,776.03 oz   Height 62 in   BP 131/71 mmHg   S' Lateral 2.30 cm   Est EF 60 - 65%   Glucose, capillary     Status: Abnormal   Collection Time: 08/09/23  4:01 PM  Result Value Ref Range   Glucose-Capillary 144 (H) 70 - 99 mg/dL    Comment: Glucose reference range applies only to samples taken after fasting for at least 8 hours.  Glucose, capillary     Status: Abnormal   Collection Time: 08/09/23  8:49 PM  Result Value Ref Range   Glucose-Capillary 113 (H) 70 - 99 mg/dL    Comment: Glucose reference range applies only to samples taken after fasting for at least 8 hours.  Glucose, capillary     Status: Abnormal   Collection Time: 08/09/23 11:39 PM  Result Value Ref Range   Glucose-Capillary 153 (H) 70 -  99 mg/dL    Comment: Glucose reference range applies only to samples taken after fasting for at least 8 hours.  Heparin level (unfractionated)     Status: None   Collection Time: 08/10/23  3:45 AM  Result Value Ref Range   Heparin Unfractionated 0.37 0.30 - 0.70 IU/mL    Comment: (NOTE) The clinical reportable range upper limit is being lowered to >1.10 to align with the FDA approved guidance for the current laboratory assay.  If heparin results are below expected values, and patient dosage has  been confirmed, suggest follow up testing of antithrombin III levels. Performed at [facility], [address], [place], [place] [address]   Magnesium     Status: None   Collection Time: 08/10/23  3:46 AM  Result Value Ref Range   Magnesium 1.9 1.7 - 2.4 mg/dL    Comment: Performed at [facility], [address], [place], [place] [address]  Phosphorus     Status: None   Collection Time: 08/10/23  3:46 AM  Result Value Ref Range   Phosphorus 3.3 2.5 - 4.6 mg/dL    Comment: Performed at [facility], [address] [place], [place] [address]  Comprehensive metabolic panel     Status: Abnormal   Collection Time: 08/10/23  3:46 AM  Result Value Ref Range   Sodium 142 135 - 145 mmol/L   Potassium 3.7 3.5 - 5.1 mmol/L   Chloride 104 98 - 111 mmol/L   CO2 32 22 - 32 mmol/L   Glucose, Bld 79 70 - 99 mg/dL    Comment: Glucose reference range applies only to samples taken after fasting for at least 8 hours.   BUN 11 8 - 23 mg/dL   Creatinine, Ser 2.59 (H) 0.44 - 1.00 mg/dL   Calcium 8.2 (L) 8.9 - 10.3 mg/dL   Total Protein 5.0 (L) 6.5 - 8.1 g/dL   Albumin 2.4 (L) 3.5 - 5.0 g/dL   AST 23 15 - 41 U/L  ALT 36 0 - 44 U/L   Alkaline Phosphatase 41 38 - 126 U/L   Total Bilirubin 1.0 0.3 - 1.2 mg/dL   GFR, Estimated 53 (L) >60 mL/min    Comment: (NOTE) Calculated using the CKD-EPI Creatinine Equation (2021)    Anion gap 6 5 - 15    Comment: Performed at [facility], [address] [place], [place] [address]  Glucose, capillary     Status: None   Collection Time: 08/10/23  4:39 AM  Result Value Ref Range   Glucose-Capillary 85 70 - 99 mg/dL    Comment: Glucose reference range applies only to samples taken after fasting for at least 8 hours.  Glucose, capillary     Status: Abnormal   Collection Time: 08/10/23  8:10 AM  Result Value Ref Range   Glucose-Capillary 105 (H) 70 - 99 mg/dL    Comment: Glucose reference range applies only to samples taken after fasting for at least 8 hours.  Glucose, capillary     Status: Abnormal   Collection Time: 08/10/23 12:36 PM  Result Value Ref Range   Glucose-Capillary 158 (H) 70 - 99 mg/dL    Comment: Glucose reference range applies only to samples taken after fasting for at least 8 hours.  Glucose, capillary     Status: Abnormal   Collection Time: 08/10/23  4:08 PM  Result Value Ref Range   Glucose-Capillary 110 (H) 70 - 99 mg/dL    Comment: Glucose reference range applies only to samples taken after fasting for at least 8 hours.  Glucose, capillary     Status: Abnormal   Collection Time: 08/10/23  9:15 PM  Result Value Ref Range   Glucose-Capillary 102 (H) 70 - 99 mg/dL    Comment: Glucose reference range applies only to samples taken after fasting for at least 8 hours.  Glucose, capillary     Status: Abnormal   Collection Time: 08/11/23  8:08 AM  Result Value Ref Range   Glucose-Capillary 115 (H) 70 - 99 mg/dL    Comment: Glucose reference range applies only to samples taken after fasting for at least 8 hours.  Urinalysis, Routine w reflex microscopic -Urine, Clean Catch     Status: Abnormal   Collection Time: 08/31/23  8:31 PM  Result Value Ref Range   Color, Urine YELLOW YELLOW   APPearance CLEAR CLEAR   Specific Gravity, Urine 1.025 1.005 - 1.030   pH 5.5 5.0 - 8.0   Glucose, UA 250 (A) NEGATIVE mg/dL   Hgb urine dipstick SMALL (A) NEGATIVE   Bilirubin Urine SMALL (A) NEGATIVE   Ketones,  ur 15 (A) NEGATIVE mg/dL   Protein, ur 191 (A) NEGATIVE mg/dL   Nitrite POSITIVE (A) NEGATIVE   Leukocytes,Ua MODERATE (A) NEGATIVE    Comment: Performed at [facility], [address] [place], [place] [address]  Urinalysis, Microscopic (reflex)     Status: Abnormal   Collection Time: 08/31/23  8:31 PM  Result Value Ref Range   RBC / HPF 6-10 0 - 5 RBC/hpf   WBC, UA 21-50 0 - 5 WBC/hpf   Bacteria, UA MANY (A) NONE SEEN   Squamous Epithelial / HPF 0-5 0 - 5 /HPF    Comment: Performed at [facility], [address], [place], [place] [address]  Urine Culture     Status: Abnormal   Collection Time: 09/01/23  5:44 PM   Specimen: Urine, Clean Catch  Result Value Ref  Range   Specimen Description      URINE, CLEAN CATCH Performed at [facility], [address] [place], [place] [address]    Special Requests      NONE Performed at [facility], [address], [place], [place] [address]    Culture >=100,000 COLONIES/mL KLEBSIELLA PNEUMONIAE (A)    Report Status 09/03/2023 FINAL    Organism ID, Bacteria KLEBSIELLA PNEUMONIAE (A)       Susceptibility   Klebsiella pneumoniae - MIC*    AMPICILLIN RESISTANT Resistant     CEFAZOLIN <=4 SENSITIVE Sensitive     CEFEPIME <=0.12 SENSITIVE Sensitive     CEFTRIAXONE <=0.25 SENSITIVE Sensitive     CIPROFLOXACIN <=0.25 SENSITIVE Sensitive     GENTAMICIN <=1 SENSITIVE Sensitive     IMIPENEM <=0.25 SENSITIVE Sensitive     NITROFURANTOIN 256 RESISTANT Resistant     TRIMETH/SULFA <=20 SENSITIVE Sensitive     AMPICILLIN/SULBACTAM <=2 SENSITIVE Sensitive     PIP/TAZO 8 SENSITIVE Sensitive ug/mL    * >=100,000 COLONIES/mL KLEBSIELLA PNEUMONIAE         has a past medical history of Acute bronchitis, Allergic rhinitis, cause unspecified, Anemia, Anxiety, B12 deficiency, BV (bacterial vaginosis) (06/22/1996), Calculus of kidney, Calculus of kidney, Coronary atherosclerosis of unspecified  type of vessel, native or graft, Dizziness, Essential hypertension, benign, Fibroid (2003), Fibromyalgia, H/O dysmenorrhea (2008), H/O varicella, Headache(784.0), HSV-2 infection (2009), Hyperplastic colon polyp (05/16/2014), Irritable bowel syndrome, Meniere's disease, unspecified, Menses, irregular (2003), Myalgia and myositis, unspecified, Myocardial infarction (HCC), Obstructive sleep apnea (adult) (pediatric), Perimenopausal symptoms (2003), Pure hypercholesterolemia, Sarcoidosis, Type II or unspecified type diabetes mellitus without mention of complication, not stated as uncontrolled, Unspecified venous (peripheral) insufficiency, Vitamin D deficiency, Vulvitis (2010), and Yeast infection.   reports that she has never smoked. She has never used smokeless tobacco.  Past Surgical History:  Procedure Laterality Date   COLONOSCOPY     CORONARY STENT INTERVENTION N/A 06/15/2017   Procedure: CORONARY STENT INTERVENTION;  Surgeon: Corky Crafts, MD;  Location: [facility] INVASIVE CV LAB;  Service: Cardiovascular;  Laterality: N/A;   heart catherization     IR ANGIOGRAM PULMONARY BILATERAL SELECTIVE  08/04/2023   IR ANGIOGRAM SELECTIVE EACH ADDITIONAL VESSEL  08/04/2023   IR ANGIOGRAM SELECTIVE EACH ADDITIONAL VESSEL  08/04/2023   IR THROMBECT PRIM MECH INIT (INCLU) MOD SED  08/04/2023   IR US GUIDE VASC ACCESS RIGHT  08/04/2023   LEFT HEART CATH AND CORONARY ANGIOGRAPHY N/A 06/15/2017   Procedure: LEFT HEART CATH AND CORONARY ANGIOGRAPHY;  Surgeon: Corky Crafts, MD;  Location: [facility] INVASIVE CV LAB;  Service: Cardiovascular;  Laterality: N/A;   RADIOLOGY WITH ANESTHESIA N/A 08/04/2023   Procedure: IR WITH ANESTHESIA;  Surgeon: Julieanne Cotton, MD;  Location: [facility] OR;  Service: Radiology;  Laterality: N/A;   TONSILLECTOMY      Allergies  Allergen Reactions   Actos [Pioglitazone] Other (See Comments)    REACTION: pt states INTOL w/ edema   Codeine Other (See Comments)    REACTION: vomiting    Januvia [Sitagliptin] Nausea Only   Lactose Intolerance (Gi) Diarrhea   Morphine Nausea Only and Nausea And Vomiting    REACTION: vomiting REACTION: vomiting   Repatha [Evolocumab]     WAS NOT EFFECTIVE IN LOWERING LDL    Immunization History  Administered Date(s) Administered   Influenza Split 08/16/2011, 08/16/2012, 08/16/2013, 09/04/2014   Influenza Whole 09/16/2009, 08/15/2010   Influenza,inj,Quad PF,6+ Mos 08/30/2016, 06/16/2017, 10/23/2018, 09/14/2019   Influenza-Unspecified 08/19/2015  PFIZER(Purple Top)SARS-COV-2 Vaccination 01/17/2020, 02/13/2020   Pneumococcal Polysaccharide-23 11/15/2004   Tdap 02/22/2011    Family History  Adopted: Yes  Problem Relation Age of Onset   Other Other        ADOPTED   Colon cancer Neg Hx    Esophageal cancer Neg Hx    Rectal cancer Neg Hx    Stomach cancer Neg Hx      Current Outpatient Medications:    allopurinol (ZYLOPRIM) 100 MG tablet, Take 1 tablet by mouth daily as needed., Disp: , Rfl:    ALPRAZolam (XANAX) 0.5 MG tablet, Take 0.5 mg by mouth 3 (three) times daily., Disp: , Rfl:    apixaban (ELIQUIS) 5 MG TABS tablet, Take 10 mg by mouth twice daily, then on 08/17/2023-switch to 5 mg by mouth twice daily., Disp: 180 tablet, Rfl: 3   azaTHIOprine (IMURAN) 50 MG tablet, Take 1 tablet by mouth daily., Disp: , Rfl:    benzonatate (TESSALON) 100 MG capsule, Take 1 capsule (100 mg total) by mouth 3 (three) times daily as needed for cough., Disp: 30 capsule, Rfl: 1   Empagliflozin-metFORMIN HCl ER 25-1000 MG TB24, Take 1 tablet by mouth daily., Disp: , Rfl:    GEMTESA 75 MG TABS, Take 1 tablet by mouth daily., Disp: , Rfl:    icosapent Ethyl (VASCEPA) 1 g capsule, TAKE 2 CAPSULES(2 GRAMS) BY MOUTH TWICE DAILY, Disp: 360 capsule, Rfl: 3   metoprolol succinate (TOPROL-XL) 100 MG 24 hr tablet, Take 1 tablet (100 mg total) by mouth daily. Take with or immediately following a meal., Disp: 90 tablet, Rfl: 3   NEXLETOL 180 MG TABS, TAKE 1  TABLET BY MOUTH DAILY, Disp: 90 tablet, Rfl: 1   nitroGLYCERIN (NITROSTAT) 0.4 MG SL tablet, Place 1 tablet (0.4 mg total) under the tongue every 5 (five) minutes as needed for chest pain., Disp: 25 tablet, Rfl: 2   ONETOUCH VERIO test strip, CHECK BLOOD SUGAR TID AS DIRECTED, Disp: , Rfl:    pantoprazole (PROTONIX) 40 MG tablet, Take 1 tablet (40 mg total) by mouth daily., Disp: 30 tablet, Rfl: 0   PRALUENT 150 MG/ML SOAJ, ADMINISTER 1 ML UNDER THE SKIN EVERY 14 DAYS, Disp: 6 mL, Rfl: 3   spironolactone (ALDACTONE) 25 MG tablet, TAKE 1 TABLET BY MOUTH EVERY DAY, Disp: 90 tablet, Rfl: 2      Objective:   There were no vitals filed for this visit.  Estimated body mass index is 27.07 kg/m as calculated from the following:   Height as of 09/29/23: 5\' 2"  (1.575 m).   Weight as of 09/28/23: 148 lb (67.1 kg).  @WEIGHTCHANGE @  There were no vitals filed for this visit.   Physical Exam   General: No distress. *** O2 at rest: *** Cane present: *** Sitting in wheel chair: *** Frail: *** Obese: *** Neuro: Alert and Oriented x 3. GCS 15. Speech normal Psych: Pleasant Resp:  Barrel Chest - ***.  Wheeze - ***, Crackles - ***, No overt respiratory distress CVS: Normal heart sounds. Murmurs - *** Ext: Stigmata of Connective Tissue Disease - *** HEENT: Normal upper airway. PEERL +. No post nasal drip        Assessment:     No diagnosis found.     Plan:     There are no Patient Instructions on file for this visit.   FOLLOWUP No follow-ups on file.    SIGNATURE    Dr. Kalman Shan, M.D., F.C.C.P,  Pulmonary and Critical Care Medicine  Staff Physician, [facility] Director - Interstitial Lung Disease  Program  Pulmonary Fibrosis [facility] [place], [place], [address]  Pager: [phone], If no answer or between  15:00h - 7:00h: call [phone] Telephone: [phone]  5:46 PM 10/03/2023   Moderate  Complexity MDM OFFICE  2021 E/M guidelines, first released in 2021, with minor revisions added in 2023 and 2024 Must meet the requirements for 2 out of 3 dimensions to qualify.    Number and complexity of problems addressed Amount and/or complexity of data reviewed Risk of complications and/or morbidity  One or more chronic illness with mild exacerbation, OR progression, OR  side effects of treatment  Two or more stable chronic illnesses  One undiagnosed new problem with uncertain prognosis  One acute illness with systemic symptoms   One Acute complicated injury Must meet the requirements for 1 of 3 of the categories)  Category 1: Tests and documents, historian  Any combination of 3 of the following:  Assessment requiring an independent historian  Review of prior external note(s) from each unique source  Review of results of each unique test  Ordering of each unique test    Category 2: Interpretation of tests   Independent interpretation of a test performed by another physician/other qualified health care professional (not separately reported)  Category 3: Discuss management/tests  Discussion of management or test interpretation with external physician/other qualified health care professional/appropriate source (not separately reported) Moderate risk of morbidity from additional diagnostic testing or treatment Examples only:  Prescription drug management  Decision regarding minor surgery with identfied patient or procedure risk factors  Decision regarding elective major surgery without identified patient or procedure risk factors  Diagnosis or treatment significantly limited by social determinants of health             HIGh Complexity  OFFICE   2021 E/M guidelines, first released in 2021, with minor revisions added in 2023. Must meet the requirements for 2 out of 3 dimensions to qualify.    Number and complexity of problems addressed Amount and/or  complexity of data reviewed Risk of complications and/or morbidity  Severe exacerbation of chronic illness  Acute or chronic illnesses that may pose a threat to life or bodily function, e.g., multiple trauma, acute MI, pulmonary embolus, severe respiratory distress, progressive rheumatoid arthritis, psychiatric illness with potential threat to self or others, peritonitis, acute renal failure, abrupt change in neurological status Must meet the requirements for 2 of 3 of the categories)  Category 1: Tests and documents, historian  Any combination of 3 of the following:  Assessment requiring an independent historian  Review of prior external note(s) from each unique source  Review of results of each unique test  Ordering of each unique test    Category 2: Interpretation of tests    Independent interpretation of a test performed by another physician/other qualified health care professional (not separately reported)  Category 3: Discuss management/tests  Discussion of management or test interpretation with external physician/other qualified health care professional/appropriate source (not separately reported)  HIGH risk of morbidity from additional diagnostic testing or treatment Examples only:  Drug therapy requiring intensive monitoring for toxicity  Decision for elective major surgery with identified pateint or procedure risk factors  Decision regarding hospitalization or escalation of level of care  Decision for DNR or to de-escalate care   Parenteral controlled  substances  LEGEND - Independent interpretation involves the interpretation of a test for which there is a CPT code, and an interpretation or report is customary. When a review and interpretation of a test is performed and documented by the provider, but not separately reported (billed), then this would represent an independent interpretation. This report does not need to conform to the usual  standards of a complete report of the test. This does not include interpretation of tests that do not have formal reports such as a complete blood count with differential and blood cultures. Examples would include reviewing a chest radiograph and documenting in the medical record an interpretation, but not separately reporting (billing) the interpretation of the chest radiograph.   An appropriate source includes professionals who are not health care professionals but may be involved in the management of the patient, such as a Clinical research associate, upper officer, case manager or teacher, and does not include discussion with family or informal caregivers.    - SDOH: SDOH are the conditions in the environments where people are born, live, learn, work, play, worship, and age that affect a wide range of health, functioning, and quality-of-life outcomes and risks. (e.g., housing, food insecurity, transportation, etc.). SDOH-related Z codes ranging from Z55-Z65 are the ICD-10-CM diagnosis codes used to document SDOH data Z55 - Problems related to education and literacy Z56 - Problems related to employment and unemployment Z57 - Occupational exposure to risk factors Z58 - Problems related to physical environment Z59 - Problems related to housing and economic circumstances [phone] - Problems related to social environment [phone] - Problems related to upbringing [phone] - Other problems related to primary support group, including family circumstances Z59 - Problems related to certain psychosocial circumstances Z65 - Problems related to other psychosocial circumstances

## 2023-10-04 ENCOUNTER — Ambulatory Visit: Payer: Medicare PPO | Admitting: Internal Medicine

## 2023-10-04 ENCOUNTER — Encounter: Payer: Self-pay | Admitting: Internal Medicine

## 2023-10-04 VITALS — BP 121/80 | HR 73 | Ht 62.0 in | Wt 149.8 lb

## 2023-10-04 DIAGNOSIS — D500 Iron deficiency anemia secondary to blood loss (chronic): Secondary | ICD-10-CM | POA: Diagnosis not present

## 2023-10-04 DIAGNOSIS — S20219S Contusion of unspecified front wall of thorax, sequela: Secondary | ICD-10-CM

## 2023-10-04 DIAGNOSIS — M3320 Polymyositis, organ involvement unspecified: Secondary | ICD-10-CM

## 2023-10-04 DIAGNOSIS — I2699 Other pulmonary embolism without acute cor pulmonale: Secondary | ICD-10-CM | POA: Diagnosis not present

## 2023-10-04 NOTE — Patient Instructions (Addendum)
Pulmonary embolism, unspecified chronicity, unspecified pulmonary embolism type, unspecified whether acute cor pulmonale present [facility])  -History of massive pulmonary embolism September 2024 with cardiopulmonary arrest.  Glad you are better right now and you are on Eliquis without any bleeding episodes  Plan - Continue Eliquis; current plan is indefinite but we can reassess D-dimer in 1 year - Keep up echocardiogram appointment December 2024 and subsequent follow-up with Dr. Clifton James for any evidence of pulmonary hypertension -monitor clinically for CTEPH   Blood loss anemia -hemoglobin 8 g% September 2024  Plan - Please check with Dr. Dierdre Forth about your CBC results from yesterday 10/03/2023  Polymyositis [facility])  -Ongoing for 25 years based on 2020 and 2024 CT scan of the chest there is no evidence of interstitial lung disease  Plan - Imuran per Dr. Dierdre Forth  Chest wall hematoma from cardiopulmonary resuscitation  -Improving on physical exam 10/04/2023  Plan - Expectant follow-up  Follow-up - 6 months or sooner if needed

## 2023-10-05 ENCOUNTER — Other Ambulatory Visit: Payer: Self-pay | Admitting: Interventional Cardiology

## 2023-10-05 ENCOUNTER — Ambulatory Visit: Payer: Medicare PPO

## 2023-10-05 DIAGNOSIS — R252 Cramp and spasm: Secondary | ICD-10-CM

## 2023-10-05 DIAGNOSIS — M6281 Muscle weakness (generalized): Secondary | ICD-10-CM

## 2023-10-05 DIAGNOSIS — R293 Abnormal posture: Secondary | ICD-10-CM | POA: Diagnosis not present

## 2023-10-05 DIAGNOSIS — R6889 Other general symptoms and signs: Secondary | ICD-10-CM | POA: Diagnosis not present

## 2023-10-05 DIAGNOSIS — R262 Difficulty in walking, not elsewhere classified: Secondary | ICD-10-CM

## 2023-10-05 NOTE — Therapy (Signed)
OUTPATIENT PHYSICAL THERAPY LOWER EXTREMITY TREATMENT   Patient Name: Jocelyn [name] MRN: [id number] DOB:May 05, 1956, 67 y.o., female Today's Date: 10/05/2023  END OF SESSION:  PT End of Session - 10/05/23 1455     Visit Number 5    Date for PT Re-Evaluation 10/19/23    Authorization Type [employer]    PT Start Time 1445    PT Stop Time 1530    PT Time Calculation (min) 45 min    Activity Tolerance Patient tolerated treatment well    Behavior During Therapy WFL for tasks assessed/performed             Past Medical History:  Diagnosis Date   Acute bronchitis    Allergic rhinitis, cause unspecified    Anemia    Anxiety    B12 deficiency    BV (bacterial vaginosis) 06/22/1996   Calculus of kidney    Calculus of kidney    Coronary atherosclerosis of unspecified type of vessel, native or graft    Dizziness    Essential hypertension, benign    Fibroid 2003   Fibromyalgia    H/O dysmenorrhea 2008   H/O varicella    Headache(784.0)    frequently   HSV-2 infection 2009   Hyperplastic colon polyp 05/16/2014   Irritable bowel syndrome    Meniere's disease, unspecified    Menses, irregular 2003   Myalgia and myositis, unspecified    Myocardial infarction (HCC)    Obstructive sleep apnea (adult) (pediatric)    Perimenopausal symptoms 2003   Pure hypercholesterolemia    Sarcoidosis    Type II or unspecified type diabetes mellitus without mention of complication, not stated as uncontrolled    Unspecified venous (peripheral) insufficiency    Vitamin D deficiency    Vulvitis 2010   Yeast infection    Past Surgical History:  Procedure Laterality Date   COLONOSCOPY     CORONARY STENT INTERVENTION N/A 06/15/2017   Procedure: CORONARY STENT INTERVENTION;  Surgeon: Corky Crafts, MD;  Location: [facility];  Service: Cardiovascular;  Laterality: N/A;   heart catherization     IR ANGIOGRAM PULMONARY BILATERAL SELECTIVE  08/04/2023   IR ANGIOGRAM SELECTIVE  EACH ADDITIONAL VESSEL  08/04/2023   IR ANGIOGRAM SELECTIVE EACH ADDITIONAL VESSEL  08/04/2023   IR THROMBECT PRIM MECH INIT (INCLU) MOD SED  08/04/2023   IR US GUIDE VASC ACCESS RIGHT  08/04/2023   LEFT HEART CATH AND CORONARY ANGIOGRAPHY N/A 06/15/2017   Procedure: LEFT HEART CATH AND CORONARY ANGIOGRAPHY;  Surgeon: Corky Crafts, MD;  Location: [facility];  Service: Cardiovascular;  Laterality: N/A;   RADIOLOGY WITH ANESTHESIA N/A 08/04/2023   Procedure: IR WITH ANESTHESIA;  Surgeon: Julieanne Cotton, MD;  Location: [facility];  Service: Radiology;  Laterality: N/A;   TONSILLECTOMY     Patient Active Problem List   Diagnosis Date Noted   Physical deconditioning 08/23/2023   Acute respiratory failure with hypoxia (HCC) 08/05/2023   Pulmonary embolism (HCC) 08/03/2023   Pulmonary edema 08/03/2023   Degenerative arthritis of knee 08/20/2021   Hx of pleural effusion    Dyspnea 08/16/2018   Fall on or from sidewalk curb, initial encounter 05/31/2018   History of acute inferior wall MI 08/22/2017   Diabetes mellitus (HCC)    S/P right coronary artery (RCA) stent placement    Overweight 05/05/2017   Diabetes mellitus type 2, controlled, without complications (HCC) 12/16/2016   Asthmatic bronchitis , chronic (HCC) 05/10/2016   Polymyositis (HCC)  09/15/2015   History of sarcoidosis 09/15/2015   Falling 03/13/2015   Edema 02/13/2014   Coronary artery disease involving native heart without angina pectoris 10/29/2013   Hyperlipidemia 10/29/2013   Iron (Fe) deficiency anemia 08/08/2013   Vitamin B 12 deficiency 08/08/2013   GERD (gastroesophageal reflux disease) 09/08/2011   Weakness 02/22/2011   Depression 02/22/2011   MENIERE'S DISEASE 04/30/2009   Vitamin D deficiency 02/05/2009   Venous (peripheral) insufficiency 12/12/2007   RENAL CALCULUS 12/12/2007   DIZZINESS 12/12/2007   HYPERCHOLESTEROLEMIA 09/16/2007   Anxiety state 09/16/2007   HYPERTENSION, BENIGN 09/16/2007    Allergic rhinitis 09/16/2007   IRRITABLE BOWEL SYNDROME 09/16/2007   Myalgia and myositis 09/16/2007    PCP: Renaye Rakers, MD   REFERRING PROVIDER: Jacklynn Ganong, FNP  REFERRING DIAG: [phone] (ICD-10-CM) - Physical deconditioning  THERAPY DIAG:  Difficulty in walking, not elsewhere classified  Muscle weakness (generalized)  Decreased activity tolerance  Cramp and spasm  Abnormal posture  Rationale for Evaluation and Treatment: Rehabilitation  ONSET DATE: 08/23/2023  SUBJECTIVE:   SUBJECTIVE STATEMENT: Patient states she saw a pulmonologist yesterday at the same office as the PA that she saw last week for her pulmonary assessment.  On this visit, the doctor did just the finger pulse ox and detected no problems at all with her O2 sats dropping.  She states she is eating better and feeling good.      PERTINENT HISTORY: na PAIN:  Are you having pain? No  PRECAUTIONS: Other: RECENT PE, MONITOR O2 AND HR!  MD has requested BP monitoring as patient was low at her last 2 visits:  09/28/23: BP 150/89 HR: 69  RED FLAGS: None   WEIGHT BEARING RESTRICTIONS: No  FALLS:  Has patient fallen in last 6 months? No  LIVING ENVIRONMENT: Lives with: lives alone Lives in: House/apartment Stairs: Yes: Internal: 12 steps; on right going up and External: 4 steps; on left going up Has following equipment at home: None  OCCUPATION: get details next visit  PLOF: Independent, Independent with basic ADLs, Independent with household mobility without device, Independent with community mobility without device, Independent with homemaking with ambulation, Independent with gait, and Independent with transfers  PATIENT GOALS: To be able to get back to things I enjoy doing: working part time, sorority events, doing everything independently, driving  NEXT MD VISIT: PRN  OBJECTIVE:  Note: Objective measures were completed at Evaluation unless otherwise noted.  DIAGNOSTIC FINDINGS:  na  COGNITION: Overall cognitive status:  patient wasn't talking much and had flat affect, dtr spoke for patient most of the session      SENSATION: WFL   MUSCLE LENGTH: Generally good flexibility  POSTURE: rounded shoulders and forward head  PALPATION: na  LOWER EXTREMITY ROM:  WNL  LOWER EXTREMITY MMT:  Extremely weak all over: inconclusive due to patient effort- she wasn't feeling well Generally 3 to 3+/5 upper and lower extremities   FUNCTIONAL TESTS: Target HR 122,  Max HR 152 5 times sit to stand: 33.91 With use of hands with O2 sat at no lower than 95% and HR at max 114 Timed up and go (TUG): 19.62 with O2 drop to no lower than 97% and HR max at 111   GAIT: Distance walked: 30 feet Assistive device utilized: None Level of assistance: SBA Comments: slow, low energy   TODAY'S TREATMENT:  DATE: 10/05/23 Recumbent Bike x 5 min level 1 (PT present to discuss status) Sit to stand 2 x 7 without UE support with 5 lb kb Seated LAQ with 3 lbs 2 x 10 each LE  Seated march x 20 with 3 lbs Seated hip ER with 3 lbs 2 x 10 each LE Standing hip extension 3# 2 x 10 Standing hip abduction 3# 2 x 10 Seated clam x20 with yellow loop Seated bicep curls with 2 lb x 20 Seated overhead press (alternating "steam engines) with 1 lb 2 x 10 Standing lateral band walks with blue loop x 3 laps at barre Perceived exertion 3/10  DATE: 09/28/23 Nustep x 5 min level 2 (PT present to discuss status) Sit to stand 2 x 5 without UE support with 5 lb kb Seated LAQ with 3 lbs 2 x 10 each LE  Seated march x 20 with 3 lbs Seated hip ER with 3 lbs 2 x 10 each LE Seated bicep curls with 1 lb x 20 Seated overhead press with 1 lb 2 x 10 Seated clam x20 with yellow loop Standing hip extension 0# 2 x 10 Standing hip abduciton 0# 2 x 10 Standing alternating tap ups on 6"  step x 20 Standing lateral band walks with blue loop x 3 laps at barre Perceived exertion 5/10 Pulse/ox reading: immediately post standing exercises: 100% with a drop no lower than 96% for 2 min time period.  HR no higher than 100.    DATE: 09/20/23 Nustep x 5 min level 2 (PT present to discuss status) Seated LAQ with 2.5 lbs 2 x 10 each LE (less verbal cues for full extension today) Seated march x 20 with 2.5 lbs Sit to stand 2 x 5 without UE support with 5 lb kb  Seated bicep curls with 1 lb 2 x 10 Seated overhead press with 1 lb 2 x 10 Seated clam 2 x 10 with yellow loop Supine SLR 2 x 5 each LE Supine bridge 2 x 5 Hooklying march x 20 Lower trunk rotation in hooklying x 20 (10 each side)  4" step up x 10 each LE at counter At counter with 6 inch step: alternating tap ups with single UE support  DATE: 08/24/23 Initial eval completed and initiated HEP    PATIENT EDUCATION:  Education details: initiated HEP and educated on how to determine target heart rate and max heart rate Person educated: Patient and Child(ren) Education method: Programmer, multimedia, Facilities manager, Verbal cues, and Handouts Education comprehension: verbalized understanding, returned demonstration, and verbal cues required  HOME EXERCISE PROGRAM: Access Code: [email or web address] URL: [email or web address] Date: 08/24/2023 Prepared by: Mikey Kirschner  Exercises - Seated Overhead Press  - 1 x daily - 7 x weekly - 2 sets - 10 reps - Seated Bicep Curls Supinated with Dumbbells  - 1 x daily - 7 x weekly - 2 sets - 10 reps - Seated Long Arc Quad  - 1 x daily - 7 x weekly - 2 sets - 10 reps - Seated March  - 1 x daily - 7 x weekly - 1 sets - 20 reps - Sit to Stand with Armchair  - 1 x daily - 7 x weekly - 2 sets - 5 reps  ASSESSMENT:  CLINICAL IMPRESSION: Mrs. Dittmar did very well today.  She tolerated more resistance on several exercises today.  She needed no rest breaks.  She was more energetic and talkative.   She demonstrates good recall of proper technique.  She is  eating well now and going out with her dtr more.    She would benefit from continuing skilled PT for upper and lower extremity strengthening along with balance and gait training to restore patient to her prior level of function.    OBJECTIVE IMPAIRMENTS: cardiopulmonary status limiting activity, decreased activity tolerance, decreased balance, decreased endurance, decreased knowledge of use of DME, decreased mobility, difficulty walking, decreased strength, and impaired UE functional use.   ACTIVITY LIMITATIONS: carrying, lifting, bending, standing, squatting, stairs, transfers, bed mobility, continence, bathing, toileting, dressing, reach over head, and hygiene/grooming  PARTICIPATION LIMITATIONS: meal prep, cleaning, laundry, driving, shopping, community activity, occupation, yard work, and church  PERSONAL FACTORS: Fitness are also affecting patient's functional outcome.   REHAB POTENTIAL: Good  CLINICAL DECISION MAKING: Stable/uncomplicated  EVALUATION COMPLEXITY: Low   GOALS: Goals reviewed with patient? Yes  SHORT TERM GOALS: Target date: 09/22/2023  Patient to be independent with initial HEP Baseline: Goal status: MET 09/20/23  2.  Patient to be able to prepare small meal for herself independently Baseline:  Goal status: In progress 09/20/23  3.  Patient to report increased appetite and improved nutrition Baseline:  Goal status: MET 10/05/23   LONG TERM GOALS: Target date: 10/20/2023   Patient to be independent with advanced HEP  Baseline:  Goal status: INITIAL  2.  Patient to show improvement of at least 3 seconds on 5 times sit to stand test Baseline:  Goal status: INITIAL  3.  Patient to show improvement of at least 3 seconds on TUG test Baseline:  Goal status: INITIAL  4.  Patient to demonstrate good heel to toe progression and no loss of balance for 300 feet Baseline:  Goal status: INITIAL  5.   Patient to be able to resume driving Baseline:  Goal status: INITIAL  6.  Patient to be able to resume work.   Baseline:  Goal status: INITIAL   PLAN:  PT FREQUENCY: 1-2x/week  PT DURATION: 8 weeks  PLANNED INTERVENTIONS: 97146- PT Re-evaluation, 97110-Therapeutic exercises, 97530- Therapeutic activity, 97112- Neuromuscular re-education, 97535- Self Care, 54098- Manual therapy, [phone]- Gait training, [phone]- Canalith repositioning, [phone]- Aquatic Therapy, Patient/Family education, Balance training, Stair training, Taping, Dry Needling, Joint mobilization, Spinal mobilization, Vestibular training, Visual/preceptual remediation/compensation, DME instructions, Cryotherapy, Moist heat, Therapeutic exercises, Therapeutic activity, Neuromuscular re-education, Gait training, and Self Care  PLAN FOR NEXT SESSION: Continue Nustep, 6 min walk test, general lower and upper extremity strengthening, more functional strengthening .    Victorino Dike B. Geremy Rister, PT 10/05/23 9:21 PM [facility] [address] [place], [place] [address] Phone # [phone] Fax [phone]

## 2023-10-12 ENCOUNTER — Other Ambulatory Visit (HOSPITAL_COMMUNITY): Payer: Self-pay

## 2023-10-12 ENCOUNTER — Telehealth: Payer: Self-pay | Admitting: Internal Medicine

## 2023-10-12 NOTE — Telephone Encounter (Signed)
Received this message from Card/Pulm Rehab: Kerry Hough, Paramedic  [facility] Pool Replies will be sent to [facility], She was suppose to been referred out to have 02 at home, but they never called to get it delivered so she still does not have that. Also she reported that Dr. Marchelle Gearing was going to send in refills of her eliquis but that wasn't done, she has like a little over a week left and needs this soon. Thanks, Kerry Hough, EMT-Paramedic [phone] 10/12/2023  Previously called patient to advise Eliquis was sent to [facility] for 63yr supply and she has refills available there. Patient will pick this up. Also advised patient order was sent to [facility] for POC, provided her with ph nbr to [facility].

## 2023-10-12 NOTE — Progress Notes (Signed)
Paramedicine Encounter    Patient ID: Jocelyn [name], female    DOB: 08/21/1956, 67 y.o.   MRN: [id number]   Complaints-none   Edema-none   Compliance with meds-yes   Pill box filled-yes  If so, by whom-daughter   Refills needed-eliquis   Pt reports she is doing ok. She seen pulm on 11/19-she never got the 02 -sent message to pulm office for this  She will be calling pulm rehab again to see where she is at to be called back.  Eliquis refill is needed- they were suppose to send in refills but didn't so message sent to them as well.   She reports her chest soreness is much better and almost gone.   She reports her breathing is doing ok.  Pt denies any fluttering in her chest. No dizziness noted.  Not been checking her cbg's.  She has not taken her meds except her eliquis this morning.   Waiting for clinic sch to open up for next year to make f/u appoint.    BP 138/78   Pulse 70   Resp 16   Wt 148 lb (67.1 kg)   SpO2 98%   BMI 27.07 kg/m  Weight yesterday-149 Last visit weight-148   Patient Care Team: Renaye Rakers, MD as PCP - General (Family Medicine) Corky Crafts, MD as PCP - Cardiology (Cardiology)  Patient Active Problem List   Diagnosis Date Noted  . Physical deconditioning 08/23/2023  . Acute respiratory failure with hypoxia (HCC) 08/05/2023  . Pulmonary embolism (HCC) 08/03/2023  . Pulmonary edema 08/03/2023  . Degenerative arthritis of knee 08/20/2021  . Hx of pleural effusion   . Dyspnea 08/16/2018  . Fall on or from sidewalk curb, initial encounter 05/31/2018  . History of acute inferior wall MI 08/22/2017  . Diabetes mellitus (HCC)   . S/P right coronary artery (RCA) stent placement   . Overweight 05/05/2017  . Diabetes mellitus type 2, controlled, without complications (HCC) 12/16/2016  . Asthmatic bronchitis , chronic (HCC) 05/10/2016  . Polymyositis (HCC) 09/15/2015  . History of sarcoidosis 09/15/2015  . Falling 03/13/2015  . Edema  02/13/2014  . Coronary artery disease involving native heart without angina pectoris 10/29/2013  . Hyperlipidemia 10/29/2013  . Iron (Fe) deficiency anemia 08/08/2013  . Vitamin B 12 deficiency 08/08/2013  . GERD (gastroesophageal reflux disease) 09/08/2011  . Weakness 02/22/2011  . Depression 02/22/2011  . MENIERE'S DISEASE 04/30/2009  . Vitamin D deficiency 02/05/2009  . Venous (peripheral) insufficiency 12/12/2007  . RENAL CALCULUS 12/12/2007  . DIZZINESS 12/12/2007  . HYPERCHOLESTEROLEMIA 09/16/2007  . Anxiety state 09/16/2007  . HYPERTENSION, BENIGN 09/16/2007  . Allergic rhinitis 09/16/2007  . IRRITABLE BOWEL SYNDROME 09/16/2007  . Myalgia and myositis 09/16/2007    Current Outpatient Medications:  .  allopurinol (ZYLOPRIM) 100 MG tablet, Take 1 tablet by mouth daily as needed., Disp: , Rfl:  .  ALPRAZolam (XANAX) 0.5 MG tablet, Take 0.5 mg by mouth 3 (three) times daily., Disp: , Rfl:  .  apixaban (ELIQUIS) 5 MG TABS tablet, Take 10 mg by mouth twice daily, then on 08/17/2023-switch to 5 mg by mouth twice daily., Disp: 180 tablet, Rfl: 3 .  azaTHIOprine (IMURAN) 50 MG tablet, Take 1 tablet by mouth daily., Disp: , Rfl:  .  benzonatate (TESSALON) 100 MG capsule, Take 1 capsule (100 mg total) by mouth 3 (three) times daily as needed for cough., Disp: 30 capsule, Rfl: 1 .  Empagliflozin-metFORMIN HCl ER 25-1000 MG TB24, Take 1  tablet by mouth daily., Disp: , Rfl:  .  GEMTESA 75 MG TABS, Take 1 tablet by mouth daily., Disp: , Rfl:  .  icosapent Ethyl (VASCEPA) 1 g capsule, TAKE 2 CAPSULES(2 GRAMS) BY MOUTH TWICE DAILY, Disp: 360 capsule, Rfl: 3 .  metoprolol succinate (TOPROL-XL) 100 MG 24 hr tablet, Take 1 tablet (100 mg total) by mouth daily. Take with or immediately following a meal., Disp: 90 tablet, Rfl: 3 .  NEXLETOL 180 MG TABS, TAKE 1 TABLET BY MOUTH DAILY, Disp: 90 tablet, Rfl: 1 .  pantoprazole (PROTONIX) 40 MG tablet, Take 1 tablet (40 mg total) by mouth daily., Disp: 30  tablet, Rfl: 0 .  PRALUENT 150 MG/ML SOAJ, ADMINISTER 1 ML UNDER THE SKIN EVERY 14 DAYS, Disp: 6 mL, Rfl: 3 .  spironolactone (ALDACTONE) 25 MG tablet, TAKE 1 TABLET BY MOUTH EVERY DAY, Disp: 90 tablet, Rfl: 2 .  nitroGLYCERIN (NITROSTAT) 0.4 MG SL tablet, Place 1 tablet (0.4 mg total) under the tongue every 5 (five) minutes as needed for chest pain., Disp: 25 tablet, Rfl: 2 .  ONETOUCH VERIO test strip, CHECK BLOOD SUGAR TID AS DIRECTED, Disp: , Rfl:  Allergies  Allergen Reactions  . Actos [Pioglitazone] Other (See Comments)    REACTION: pt states INTOL w/ edema  . Codeine Other (See Comments)    REACTION: vomiting  . Januvia [Sitagliptin] Nausea Only  . Lactose Intolerance (Gi) Diarrhea  . Morphine Nausea Only and Nausea And Vomiting    REACTION: vomiting REACTION: vomiting  . Repatha [Evolocumab]     WAS NOT EFFECTIVE IN LOWERING LDL      Social History   Socioeconomic History  . Marital status: Single    Spouse name: Not on file  . Number of children: 1  . Years of education: Not on file  . Highest education level: Not on file  Occupational History  . Occupation: Retired Runner, broadcasting/film/video   Tobacco Use  . Smoking status: Never  . Smokeless tobacco: Never  . Tobacco comments:    Daily Caffeine - 1  Exercise 2-3 times/weekly  Vaping Use  . Vaping status: Never Used  Substance and Sexual Activity  . Alcohol use: No    Alcohol/week: 0.0 standard drinks of alcohol  . Drug use: No  . Sexual activity: Yes    Birth control/protection: None  Other Topics Concern  . Not on file  Social History Narrative   Exercises 2-3 times weekly   Caffeine Use: 1 daily (tea or Pepsi)   Lives alone in a one story home.  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     Social Determinants of Health   Financial Resource Strain: Low Risk  (08/05/2023)   Overall Financial Resource Strain (CARDIA)   . Difficulty of Paying Living Expenses: Not hard at all  Food Insecurity: No Food Insecurity  (08/05/2023)   Hunger Vital Sign   . Worried About Programme researcher, broadcasting/film/video in the Last Year: Never true   . Ran Out of Food in the Last Year: Never true  Transportation Needs: No Transportation Needs (08/05/2023)   PRAPARE - Transportation   . Lack of Transportation (Medical): No   . Lack of Transportation (Non-Medical): No  Physical Activity: Not on file  Stress: No Stress Concern Present (08/05/2023)   [employer] of Occupational Health - Occupational Stress Questionnaire   . Feeling of Stress : Not at all  Social Connections: Not on file  Intimate Partner Violence: Not At Risk (08/05/2023)  Humiliation, Afraid, Rape, and Kick questionnaire   . Fear of Current or Ex-Partner: No   . Emotionally Abused: No   . Physically Abused: No   . Sexually Abused: No    Physical Exam      Future Appointments  Date Time Provider Department Center  10/18/2023 11:45 AM Edrick Oh, PT [facility] None  11/01/2023  3:00 PM [facility] OP 1 [facility]  11/02/2023  2:15 PM Judi Saa, DO [facility] None  01/30/2024  3:00 PM Kathleene Hazel, MD [facility]  07/30/2024  1:45 PM Windell Norfolk, MD [facility] None       Kerry Hough, Paramedic [phone] [facility] Paramedic  10/12/23

## 2023-10-12 NOTE — Telephone Encounter (Signed)
Pls call PT back. States she is ret [employer]. [phone]

## 2023-10-12 NOTE — Telephone Encounter (Signed)
Spoke with patient again, she has called [facility] and they have no record of an order.   PCCs - please follow up on order from 09/21/23. Thanks!

## 2023-10-17 ENCOUNTER — Telehealth: Payer: Self-pay | Admitting: Internal Medicine

## 2023-10-18 ENCOUNTER — Ambulatory Visit: Payer: Medicare PPO

## 2023-10-19 ENCOUNTER — Ambulatory Visit: Payer: Medicare PPO | Attending: Family Medicine

## 2023-10-19 DIAGNOSIS — R6889 Other general symptoms and signs: Secondary | ICD-10-CM | POA: Diagnosis not present

## 2023-10-19 DIAGNOSIS — R262 Difficulty in walking, not elsewhere classified: Secondary | ICD-10-CM | POA: Diagnosis not present

## 2023-10-19 DIAGNOSIS — M6281 Muscle weakness (generalized): Secondary | ICD-10-CM | POA: Insufficient documentation

## 2023-10-19 NOTE — Therapy (Signed)
OUTPATIENT PHYSICAL THERAPY LOWER EXTREMITY TREATMENT   Patient Name: Jocelyn [name] MRN: [id number] DOB:11/01/1956, 67 y.o., female Today's Date: 10/19/2023  END OF SESSION:  PT End of Session - 10/19/23 1148     Visit Number 6    Date for PT Re-Evaluation 12/14/23    Authorization Type [employer]- requested more visits    Progress Note Due on Visit 10    PT Start Time 1104    PT Stop Time 1146    PT Time Calculation (min) 42 min    Activity Tolerance Patient tolerated treatment well    Behavior During Therapy WFL for tasks assessed/performed              Past Medical History:  Diagnosis Date   Acute bronchitis    Allergic rhinitis, cause unspecified    Anemia    Anxiety    B12 deficiency    BV (bacterial vaginosis) 06/22/1996   Calculus of kidney    Calculus of kidney    Coronary atherosclerosis of unspecified type of vessel, native or graft    Dizziness    Essential hypertension, benign    Fibroid 2003   Fibromyalgia    H/O dysmenorrhea 2008   H/O varicella    Headache(784.0)    frequently   HSV-2 infection 2009   Hyperplastic colon polyp 05/16/2014   Irritable bowel syndrome    Meniere's disease, unspecified    Menses, irregular 2003   Myalgia and myositis, unspecified    Myocardial infarction (HCC)    Obstructive sleep apnea (adult) (pediatric)    Perimenopausal symptoms 2003   Pure hypercholesterolemia    Sarcoidosis    Type II or unspecified type diabetes mellitus without mention of complication, not stated as uncontrolled    Unspecified venous (peripheral) insufficiency    Vitamin D deficiency    Vulvitis 2010   Yeast infection    Past Surgical History:  Procedure Laterality Date   COLONOSCOPY     CORONARY STENT INTERVENTION N/A 06/15/2017   Procedure: CORONARY STENT INTERVENTION;  Surgeon: Corky Crafts, MD;  [facility]: [facility];  Service: Cardiovascular;  Laterality: N/A;   heart catherization     IR ANGIOGRAM PULMONARY  BILATERAL SELECTIVE  08/04/2023   IR ANGIOGRAM SELECTIVE EACH ADDITIONAL VESSEL  08/04/2023   IR ANGIOGRAM SELECTIVE EACH ADDITIONAL VESSEL  08/04/2023   IR THROMBECT PRIM MECH INIT (INCLU) MOD SED  08/04/2023   IR US GUIDE VASC ACCESS RIGHT  08/04/2023   LEFT HEART CATH AND CORONARY ANGIOGRAPHY N/A 06/15/2017   Procedure: LEFT HEART CATH AND CORONARY ANGIOGRAPHY;  Surgeon: Corky Crafts, MD;  Location: [facility];  Service: Cardiovascular;  Laterality: N/A;   RADIOLOGY WITH ANESTHESIA N/A 08/04/2023   Procedure: IR WITH ANESTHESIA;  Surgeon: Julieanne Cotton, MD;  Location: [facility];  Service: Radiology;  Laterality: N/A;   TONSILLECTOMY     Patient Active Problem List   Diagnosis Date Noted   Physical deconditioning 08/23/2023   Acute respiratory failure with hypoxia (HCC) 08/05/2023   Pulmonary embolism (HCC) 08/03/2023   Pulmonary edema 08/03/2023   Degenerative arthritis of knee 08/20/2021   Hx of pleural effusion    Dyspnea 08/16/2018   Fall on or from sidewalk curb, initial encounter 05/31/2018   History of acute inferior wall MI 08/22/2017   Diabetes mellitus (HCC)    S/P right coronary artery (RCA) stent placement    Overweight 05/05/2017   Diabetes mellitus type 2, controlled, without complications (HCC)  12/16/2016   Asthmatic bronchitis , chronic (HCC) 05/10/2016   Polymyositis (HCC) 09/15/2015   History of sarcoidosis 09/15/2015   Falling 03/13/2015   Edema 02/13/2014   Coronary artery disease involving native heart without angina pectoris 10/29/2013   Hyperlipidemia 10/29/2013   Iron (Fe) deficiency anemia 08/08/2013   Vitamin B 12 deficiency 08/08/2013   GERD (gastroesophageal reflux disease) 09/08/2011   Weakness 02/22/2011   Depression 02/22/2011   MENIERE'S DISEASE 04/30/2009   Vitamin D deficiency 02/05/2009   Venous (peripheral) insufficiency 12/12/2007   RENAL CALCULUS 12/12/2007   DIZZINESS 12/12/2007   HYPERCHOLESTEROLEMIA 09/16/2007   Anxiety  state 09/16/2007   HYPERTENSION, BENIGN 09/16/2007   Allergic rhinitis 09/16/2007   IRRITABLE BOWEL SYNDROME 09/16/2007   Myalgia and myositis 09/16/2007    PCP: Renaye Rakers, MD   REFERRING PROVIDER: Jacklynn Ganong, FNP  REFERRING DIAG: [phone] (ICD-10-CM) - Physical deconditioning  THERAPY DIAG:  Difficulty in walking, not elsewhere classified - Plan: PT plan of care cert/re-cert  Muscle weakness (generalized) - Plan: PT plan of care cert/re-cert  Decreased activity tolerance - Plan: PT plan of care cert/re-cert  Rationale for Evaluation and Treatment: Rehabilitation  ONSET DATE: 08/23/2023  SUBJECTIVE:   SUBJECTIVE STATEMENT: Therapy is helping me.  I'm walking better and I'm getting stronger.  Steps are still a challenge.    PERTINENT HISTORY: na PAIN:  Are you having pain? No  PRECAUTIONS: Other: RECENT PE, MONITOR O2 AND HR!  MD has requested BP monitoring as patient was low at her last 2 visits:  09/28/23: BP 150/89 HR: 69  RED FLAGS: None   WEIGHT BEARING RESTRICTIONS: No  FALLS:  Has patient fallen in last 6 months? No  LIVING ENVIRONMENT: Lives with: lives alone Lives in: House/apartment Stairs: Yes: Internal: 12 steps; on right going up and External: 4 steps; on left going up Has following equipment at home: None  OCCUPATION: get details next visit  PLOF: Independent, Independent with basic ADLs, Independent with household mobility without device, Independent with community mobility without device, Independent with homemaking with ambulation, Independent with gait, and Independent with transfers  PATIENT GOALS: To be able to get back to things I enjoy doing: working part time, sorority events, doing everything independently, driving  NEXT MD VISIT: PRN  OBJECTIVE:  Note: Objective measures were completed at Evaluation unless otherwise noted.  DIAGNOSTIC FINDINGS: na  COGNITION: Overall cognitive status:  patient wasn't talking much and  had flat affect, dtr spoke for patient most of the session      SENSATION: WFL   MUSCLE LENGTH: Generally good flexibility  POSTURE: rounded shoulders and forward head  PALPATION: na  LOWER EXTREMITY ROM:  WNL  LOWER EXTREMITY MMT:  Extremely weak all over: inconclusive due to patient effort- she wasn't feeling well Generally 3 to 3+/5 upper and lower extremities   FUNCTIONAL TESTS: Target HR 122,  Max HR 152 5 times sit to stand: 33.91 With use of hands with O2 sat at no lower than 95% and HR at max 114 Timed up and go (TUG): 19.62 with O2 drop to no lower than 97% and HR max at 111   10/19/23: TUG 11.39 seconds    5x sit to stand: 33.68 without UE support   6 minute walk test: 890 feet- 1 seated rest break- 02 99% after  GAIT: Distance walked: 30 feet Assistive device utilized: None Level of assistance: SBA Comments: slow, low energy   TODAY'S TREATMENT:  DATE: 10/19/23 NuStep: level 1x 6 minutes- PT present to discuss progress 02 97%, HR 81 Sit to stand 2 x 5 without UE support  Seated LAQ with 3 lbs 2 x 10 each LE  Seated march x 20 with 3 lbs 6 min walk test-see above, 5x sit to stand, TUG Seated bicep curls with 2 lb x 20 Seated overhead press (alternating "steam engines) with 2 lb 2 x 10                                                                                                                      DATE: 10/05/23 Recumbent Bike x 5 min level 1 (PT present to discuss status) Sit to stand 2 x 7 without UE support with 5 lb kb Seated LAQ with 3 lbs 2 x 10 each LE  Seated march x 20 with 3 lbs Seated hip ER with 3 lbs 2 x 10 each LE Standing hip extension 3# 2 x 10 Standing hip abduction 3# 2 x 10 Seated clam x20 with yellow loop Seated bicep curls with 2 lb x 20 Seated overhead press (alternating "steam engines) with 1 lb 2 x 10 Standing lateral band walks with blue loop x 3 laps at barre Perceived exertion 3/10  DATE: 09/28/23 Nustep x 5 min  level 2 (PT present to discuss status) Sit to stand 2 x 5 without UE support with 5 lb kb Seated LAQ with 3 lbs 2 x 10 each LE  Seated march x 20 with 3 lbs Seated hip ER with 3 lbs 2 x 10 each LE Seated bicep curls with 1 lb x 20 Seated overhead press with 1 lb 2 x 10 Seated clam x20 with yellow loop Standing hip extension 0# 2 x 10 Standing hip abduciton 0# 2 x 10 Standing alternating tap ups on 6" step x 20 Standing lateral band walks with blue loop x 3 laps at barre Perceived exertion 5/10 Pulse/ox reading: immediately post standing exercises: 100% with a drop no lower than 96% for 2 min time period.  HR no higher than 100.    PATIENT EDUCATION:  Education details: initiated HEP and educated on how to determine target heart rate and max heart rate Person educated: Patient and Child(ren) Education method: Programmer, multimedia, Facilities manager, Verbal cues, and Handouts Education comprehension: verbalized understanding, returned demonstration, and verbal cues required  HOME EXERCISE PROGRAM: Access Code: [email or web address] URL: [email or web address] Date: 08/24/2023 Prepared by: Mikey Kirschner  Exercises - Seated Overhead Press  - 1 x daily - 7 x weekly - 2 sets - 10 reps - Seated Bicep Curls Supinated with Dumbbells  - 1 x daily - 7 x weekly - 2 sets - 10 reps - Seated Long Arc Quad  - 1 x daily - 7 x weekly - 2 sets - 10 reps - Seated March  - 1 x daily - 7 x weekly - 1 sets - 20 reps - Sit to Stand with Armchair  - 1 x daily - 7  x weekly - 2 sets - 5 reps  ASSESSMENT:  CLINICAL IMPRESSION: Pt is making steady progress with PT.  Her 02 and HR remained within normal levels today with all activity.  She completed 6 min walk test and covered 890 feet today and age related normal is 1460 feet.  She has not returned to driving or work yet.  TUG time is improved significantly, indicating reduced falls risk.  5x sit to stand is near the same time as initial testing, however, pt is able  to do without her hands today.    She would benefit from continuing skilled PT for upper and lower extremity strengthening along with balance and gait training to restore patient to her prior level of function.    OBJECTIVE IMPAIRMENTS: cardiopulmonary status limiting activity, decreased activity tolerance, decreased balance, decreased endurance, decreased knowledge of use of DME, decreased mobility, difficulty walking, decreased strength, and impaired UE functional use.   ACTIVITY LIMITATIONS: carrying, lifting, bending, standing, squatting, stairs, transfers, bed mobility, continence, bathing, toileting, dressing, reach over head, and hygiene/grooming  PARTICIPATION LIMITATIONS: meal prep, cleaning, laundry, driving, shopping, community activity, occupation, yard work, and church  PERSONAL FACTORS: Fitness are also affecting patient's functional outcome.   REHAB POTENTIAL: Good  CLINICAL DECISION MAKING: Stable/uncomplicated  EVALUATION COMPLEXITY: Low   GOALS: Goals reviewed with patient? Yes  SHORT TERM GOALS: Target date: 09/22/2023  Patient to be independent with initial HEP Baseline: Goal status: MET 09/20/23  2.  Patient to be able to prepare small meal for herself independently Baseline: able to do this (10/19/23) Goal status: MET  3.  Patient to report increased appetite and improved nutrition Baseline:  Goal status: MET 10/05/23   LONG TERM GOALS: Target date: 12/14/23   Patient to be independent with advanced HEP  Baseline:  Goal status: In progress   2.  Perform 5x sit to stand in < or = to 18 seconds without UE support Baseline: 33.68 without UE support  Goal status: In progress   3.  Patient to show improvement of at least 3 seconds on TUG test Baseline: 11.39 seconds (10/19/23) Goal status: MET  4.  Patient to demonstrate good heel to toe progression and no loss of balance for 300 feet Baseline:  Goal status: MET  5.  Patient to be able to resume  driving Baseline: has not done this yet, her family is watching her (10/19/23) Goal status: In progress   6.  Pt will ambulate > or = to 1200 feet in 6 minutes to improve community independence and allow for return to work Baseline: 890 feet  Goal status: INITIAL  7. Negotiate steps at home with alternating pattern with ascending with use of 1 rail  Baseline: step-to with use of 2 rails   Goal: INITIAL    PLAN:  PT FREQUENCY: 1-2x/week  PT DURATION: 8 weeks  PLANNED INTERVENTIONS: 97146- PT Re-evaluation, 97110-Therapeutic exercises, 97530- Therapeutic activity, 97112- Neuromuscular re-education, 97535- Self Care, 16109- Manual therapy, [phone]- Gait training, [phone]- Canalith repositioning, [phone]- Aquatic Therapy, Patient/Family education, Balance training, Stair training, Taping, Dry Needling, Joint mobilization, Spinal mobilization, Vestibular training, Visual/preceptual remediation/compensation, DME instructions, Cryotherapy, Moist heat, Therapeutic exercises, Therapeutic activity, Neuromuscular re-education, Gait training, and Self Care  PLAN FOR NEXT SESSION: Continue Nustep,work on balance, strength and endurance progression to improve independence and return to work, work on steps    [employer], PT 10/19/23 11:55 AM  [facility] [address] [place], [place] [address] Phone #  [phone] Fax [phone]

## 2023-10-20 ENCOUNTER — Other Ambulatory Visit (HOSPITAL_COMMUNITY): Payer: Self-pay

## 2023-10-25 DIAGNOSIS — F064 Anxiety disorder due to known physiological condition: Secondary | ICD-10-CM | POA: Diagnosis not present

## 2023-10-25 DIAGNOSIS — E1169 Type 2 diabetes mellitus with other specified complication: Secondary | ICD-10-CM | POA: Diagnosis not present

## 2023-10-25 DIAGNOSIS — I10 Essential (primary) hypertension: Secondary | ICD-10-CM | POA: Diagnosis not present

## 2023-10-25 DIAGNOSIS — F339 Major depressive disorder, recurrent, unspecified: Secondary | ICD-10-CM | POA: Diagnosis not present

## 2023-10-25 DIAGNOSIS — E782 Mixed hyperlipidemia: Secondary | ICD-10-CM | POA: Diagnosis not present

## 2023-10-25 DIAGNOSIS — E785 Hyperlipidemia, unspecified: Secondary | ICD-10-CM | POA: Diagnosis not present

## 2023-10-25 DIAGNOSIS — I2692 Saddle embolus of pulmonary artery without acute cor pulmonale: Secondary | ICD-10-CM | POA: Diagnosis not present

## 2023-10-26 ENCOUNTER — Other Ambulatory Visit (HOSPITAL_COMMUNITY): Payer: Self-pay

## 2023-10-26 NOTE — Progress Notes (Unsigned)
Paramedicine Encounter    Patient ID: Lucienne Minks, female    DOB: Apr 05, 1956, 67 y.o.   MRN: [id number]   Complaints-none right now  Edema-none  Compliance with meds-yes  Pill box filled-yes If so, by whom-daughter does pill box   Refills needed-she [facility]   Pt reports she is doing ok. She got recert at PT. Still not starting pulm rehab yet. They told her they would call her back. Not sure if insurance will approve to do both PT for her.  Pt denies increased sob, no dizziness, no palpitations. No bleeding issues.  Sent message to scheduling to get her in to see [facility] and she actually called while I was here to get her set up in [place].  Taking meds all ok. No issues or concerns with that. Daughter handles all refills and filling pill box.  Pt report appetite is much improved and doesn't feel that she needs the medicine to help with her appetite. Still working on increasing her fluids.   CBG's good around 103   BP 128/84   Pulse 70   Resp 16   Wt 150 lb (68 kg)   SpO2 98%   BMI 27.44 kg/m  Weight yesterday-150 @ PCP  Last visit weight-148   Patient Care Team: Renaye Rakers, MD as PCP - General (Family Medicine) Corky Crafts, MD as PCP - Cardiology (Cardiology)  Patient Active Problem List   Diagnosis Date Noted   Physical deconditioning 08/23/2023   Acute respiratory failure with hypoxia (HCC) 08/05/2023   Pulmonary embolism (HCC) 08/03/2023   Pulmonary edema 08/03/2023   Degenerative arthritis of knee 08/20/2021   Hx of pleural effusion    Dyspnea 08/16/2018   Fall on or from sidewalk curb, initial encounter 05/31/2018   History of acute inferior wall MI 08/22/2017   Diabetes mellitus (HCC)    S/P right coronary artery (RCA) stent placement    Overweight 05/05/2017   Diabetes mellitus type 2, controlled, without complications (HCC) 12/16/2016   Asthmatic bronchitis , chronic (HCC) 05/10/2016   Polymyositis (HCC)  09/15/2015   History of sarcoidosis 09/15/2015   Falling 03/13/2015   Edema 02/13/2014   Coronary artery disease involving native heart without angina pectoris 10/29/2013   Hyperlipidemia 10/29/2013   Iron (Fe) deficiency anemia 08/08/2013   Vitamin B 12 deficiency 08/08/2013   GERD (gastroesophageal reflux disease) 09/08/2011   Weakness 02/22/2011   Depression 02/22/2011   MENIERE'S DISEASE 04/30/2009   Vitamin D deficiency 02/05/2009   Venous (peripheral) insufficiency 12/12/2007   RENAL CALCULUS 12/12/2007   DIZZINESS 12/12/2007   HYPERCHOLESTEROLEMIA 09/16/2007   Anxiety state 09/16/2007   HYPERTENSION, BENIGN 09/16/2007   Allergic rhinitis 09/16/2007   IRRITABLE BOWEL SYNDROME 09/16/2007   Myalgia and myositis 09/16/2007    Current Outpatient Medications:    allopurinol (ZYLOPRIM) 100 MG tablet, Take 1 tablet by mouth daily as needed., Disp: , Rfl:    ALPRAZolam (XANAX) 0.5 MG tablet, Take 0.5 mg by mouth 3 (three) times daily., Disp: , Rfl:    apixaban (ELIQUIS) 5 MG TABS tablet, Take 10 mg by mouth twice daily, then on 08/17/2023-switch to 5 mg by mouth twice daily., Disp: 180 tablet, Rfl: 3   azaTHIOprine (IMURAN) 50 MG tablet, Take 1 tablet by mouth daily., Disp: , Rfl:    benzonatate (TESSALON) 100 MG capsule, Take 1 capsule (100 mg total) by mouth 3 (three) times daily as needed for cough., Disp: 30 capsule, Rfl: 1  Empagliflozin-metFORMIN HCl ER 25-1000 MG TB24, Take 1 tablet by mouth daily., Disp: , Rfl:    GEMTESA 75 MG TABS, Take 1 tablet by mouth daily., Disp: , Rfl:    icosapent Ethyl (VASCEPA) 1 g capsule, TAKE 2 CAPSULES(2 GRAMS) BY MOUTH TWICE DAILY, Disp: 360 capsule, Rfl: 3   metoprolol succinate (TOPROL-XL) 100 MG 24 hr tablet, Take 1 tablet (100 mg total) by mouth daily. Take with or immediately following a meal., Disp: 90 tablet, Rfl: 3   NEXLETOL 180 MG TABS, TAKE 1 TABLET BY MOUTH DAILY, Disp: 90 tablet, Rfl: 1   ONETOUCH VERIO test strip, CHECK BLOOD  SUGAR TID AS DIRECTED, Disp: , Rfl:    pantoprazole (PROTONIX) 40 MG tablet, Take 1 tablet (40 mg total) by mouth daily., Disp: 30 tablet, Rfl: 0   PRALUENT 150 MG/ML SOAJ, ADMINISTER 1 ML UNDER THE SKIN EVERY 14 DAYS, Disp: 6 mL, Rfl: 3   spironolactone (ALDACTONE) 25 MG tablet, TAKE 1 TABLET BY MOUTH EVERY DAY, Disp: 90 tablet, Rfl: 2   nitroGLYCERIN (NITROSTAT) 0.4 MG SL tablet, Place 1 tablet (0.4 mg total) under the tongue every 5 (five) minutes as needed for chest pain. (Patient not taking: Reported on 10/26/2023), Disp: 25 tablet, Rfl: 2 Allergies  Allergen Reactions   Actos [Pioglitazone] Other (See Comments)    REACTION: pt states INTOL w/ edema   Codeine Other (See Comments)    REACTION: vomiting   Januvia [Sitagliptin] Nausea Only   Lactose Intolerance (Gi) Diarrhea   Morphine Nausea Only and Nausea And Vomiting    REACTION: vomiting REACTION: vomiting   Repatha [Evolocumab]     WAS NOT EFFECTIVE IN LOWERING LDL      Social History   Socioeconomic History   Marital status: Single    Spouse name: Not on file   Number of children: 1   Years of education: Not on file   Highest education level: Not on file  Occupational History   Occupation: Retired Runner, broadcasting/film/video   Tobacco Use   Smoking status: Never   Smokeless tobacco: Never   Tobacco comments:    Daily Caffeine - 1  Exercise 2-3 times/weekly  Vaping Use   Vaping status: Never Used  Substance and Sexual Activity   Alcohol use: No    Alcohol/week: 0.0 standard drinks of alcohol   Drug use: No   Sexual activity: Yes    Birth control/protection: None  Other Topics Concern   Not on file  Social History Narrative   Exercises 2-3 times weekly   Caffeine Use: 1 daily (tea or Pepsi)   Lives alone in a one story home.  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     Social Drivers of Corporate investment banker Strain: Low Risk  (08/05/2023)   Overall Financial Resource Strain (CARDIA)    Difficulty of Paying Living  Expenses: Not hard at all  Food Insecurity: No Food Insecurity (08/05/2023)   Hunger Vital Sign    Worried About Running Out of Food in the Last Year: Never true    Ran Out of Food in the Last Year: Never true  Transportation Needs: No Transportation Needs (08/05/2023)   PRAPARE - Administrator, Civil Service (Medical): No    Lack of Transportation (Non-Medical): No  Physical Activity: Not on file  Stress: No Stress Concern Present (08/05/2023)   [employer] of Occupational Health - Occupational Stress Questionnaire    Feeling of Stress : Not at all  Social  Connections: Not on file  Intimate Partner Violence: Not At Risk (08/05/2023)   Humiliation, Afraid, Rape, and Kick questionnaire    Fear of Current or Ex-Partner: No    Emotionally Abused: No    Physically Abused: No    Sexually Abused: No    Physical Exam      Future Appointments  Date Time Provider Department Center  11/01/2023  3:00 PM [facility] OP 1 [facility]  11/02/2023  2:15 PM Judi Saa, DO [facility] None  11/17/2023  4:15 PM Mikey Kirschner B, PT [facility] None  11/23/2023  3:30 PM Vernell Barrier, PT [facility] None  11/30/2023  3:30 PM Vernell Barrier, PT [facility] None  12/07/2023  3:30 PM Vernell Barrier, PT [facility] None  12/08/2023  2:00 PM Laurey Morale, MD [facility] None  12/14/2023  3:30 PM Vernell Barrier, PT [facility] None  01/30/2024  3:00 PM Kathleene Hazel, MD [facility]  07/30/2024  1:45 PM Windell Norfolk, MD [facility] None       Kerry Hough, Paramedic [phone] [facility]  10/27/23

## 2023-10-27 NOTE — Telephone Encounter (Signed)
Most recent walk faxed to [facility].

## 2023-11-01 ENCOUNTER — Ambulatory Visit (HOSPITAL_COMMUNITY)
Admission: RE | Admit: 2023-11-01 | Discharge: 2023-11-01 | Disposition: A | Discharge status: Home / Self Care | Payer: Medicare PPO | Source: Ambulatory Visit | Attending: Cardiology | Admitting: Cardiology

## 2023-11-01 DIAGNOSIS — I2699 Other pulmonary embolism without acute cor pulmonale: Secondary | ICD-10-CM | POA: Insufficient documentation

## 2023-11-01 DIAGNOSIS — I509 Heart failure, unspecified: Secondary | ICD-10-CM | POA: Diagnosis not present

## 2023-11-01 NOTE — Progress Notes (Signed)
  Echocardiogram 2D Echocardiogram has been performed.  Ocie Doyne RDCS 11/01/2023, 3:42 PM

## 2023-11-02 ENCOUNTER — Ambulatory Visit: Payer: Medicare PPO | Admitting: Family Medicine

## 2023-11-02 LAB — ECHOCARDIOGRAM COMPLETE
AR max vel: 1.85 cm2
AV Area VTI: 2.04 cm2
AV Area mean vel: 1.8 cm2
AV Mean grad: 3 mm[Hg]
AV Peak grad: 5 mm[Hg]
Ao pk vel: 1.12 m/s
Area-P 1/2: 3.21 cm2
Calc EF: 65.4 %
MV VTI: 1.99 cm2
S' Lateral: 2.4 cm
Single Plane A2C EF: 66.6 %
Single Plane A4C EF: 62.9 %

## 2023-11-04 ENCOUNTER — Other Ambulatory Visit: Payer: Self-pay | Admitting: Interventional Cardiology

## 2023-11-17 ENCOUNTER — Ambulatory Visit: Payer: Medicare PPO | Attending: Family Medicine

## 2023-11-17 DIAGNOSIS — R262 Difficulty in walking, not elsewhere classified: Secondary | ICD-10-CM | POA: Diagnosis not present

## 2023-11-17 DIAGNOSIS — R252 Cramp and spasm: Secondary | ICD-10-CM | POA: Insufficient documentation

## 2023-11-17 DIAGNOSIS — M6281 Muscle weakness (generalized): Secondary | ICD-10-CM | POA: Insufficient documentation

## 2023-11-17 DIAGNOSIS — R6889 Other general symptoms and signs: Secondary | ICD-10-CM | POA: Insufficient documentation

## 2023-11-17 DIAGNOSIS — R293 Abnormal posture: Secondary | ICD-10-CM | POA: Insufficient documentation

## 2023-11-17 NOTE — Therapy (Signed)
 OUTPATIENT PHYSICAL THERAPY LOWER EXTREMITY TREATMENT   Patient Name: Jocelyn [name] MRN: [id number] DOB:04/03/1956, 68 y.o., female Today's Date: 11/17/2023  END OF SESSION:  PT End of Session - 11/17/23 1620     Visit Number 7    Number of Visits 14    Date for PT Re-Evaluation 12/14/23    Authorization Type [employer]- requested more visits    Progress Note Due on Visit 10    PT Start Time 1618    PT Stop Time 1702    PT Time Calculation (min) 44 min    Activity Tolerance Patient tolerated treatment well    Behavior During Therapy WFL for tasks assessed/performed              Past Medical History:  Diagnosis Date   Acute bronchitis    Allergic rhinitis, cause unspecified    Anemia    Anxiety    B12 deficiency    BV (bacterial vaginosis) 06/22/1996   Calculus of kidney    Calculus of kidney    Coronary atherosclerosis of unspecified type of vessel, native or graft    Dizziness    Essential hypertension, benign    Fibroid 2003   Fibromyalgia    H/O dysmenorrhea 2008   H/O varicella    Headache(784.0)    frequently   HSV-2 infection 2009   Hyperplastic colon polyp 05/16/2014   Irritable bowel syndrome    Meniere's disease, unspecified    Menses, irregular 2003   Myalgia and myositis, unspecified    Myocardial infarction (HCC)    Obstructive sleep apnea (adult) (pediatric)    Perimenopausal symptoms 2003   Pure hypercholesterolemia    Sarcoidosis    Type II or unspecified type diabetes mellitus without mention of complication, not stated as uncontrolled    Unspecified venous (peripheral) insufficiency    Vitamin D  deficiency    Vulvitis 2010   Yeast infection    Past Surgical History:  Procedure Laterality Date   COLONOSCOPY     CORONARY STENT INTERVENTION N/A 06/15/2017   Procedure: CORONARY STENT INTERVENTION;  Surgeon: Dann Candyce RAMAN, MD;  Location: [facility];  Service: Cardiovascular;  Laterality: N/A;   heart catherization      IR ANGIOGRAM PULMONARY BILATERAL SELECTIVE  08/04/2023   IR ANGIOGRAM SELECTIVE EACH ADDITIONAL VESSEL  08/04/2023   IR ANGIOGRAM SELECTIVE EACH ADDITIONAL VESSEL  08/04/2023   IR THROMBECT PRIM MECH INIT (INCLU) MOD SED  08/04/2023   IR US  GUIDE VASC ACCESS RIGHT  08/04/2023   LEFT HEART CATH AND CORONARY ANGIOGRAPHY N/A 06/15/2017   Procedure: LEFT HEART CATH AND CORONARY ANGIOGRAPHY;  Surgeon: Dann Candyce RAMAN, MD;  Location: [facility];  Service: Cardiovascular;  Laterality: N/A;   RADIOLOGY WITH ANESTHESIA N/A 08/04/2023   Procedure: IR WITH ANESTHESIA;  Surgeon: Dolphus Carrion, MD;  Location: [facility];  Service: Radiology;  Laterality: N/A;   TONSILLECTOMY     Patient Active Problem List   Diagnosis Date Noted   Physical deconditioning 08/23/2023   Acute respiratory failure with hypoxia (HCC) 08/05/2023   Pulmonary embolism (HCC) 08/03/2023   Pulmonary edema 08/03/2023   Degenerative arthritis of knee 08/20/2021   Hx of pleural effusion    Dyspnea 08/16/2018   Fall on or from sidewalk curb, initial encounter 05/31/2018   History of acute inferior wall MI 08/22/2017   Diabetes mellitus (HCC)    S/P right coronary artery (RCA) stent placement    Overweight 05/05/2017   Diabetes  mellitus type 2, controlled, without complications (HCC) 12/16/2016   Asthmatic bronchitis , chronic (HCC) 05/10/2016   Polymyositis (HCC) 09/15/2015   History of sarcoidosis 09/15/2015   Falling 03/13/2015   Edema 02/13/2014   Coronary artery disease involving native heart without angina pectoris 10/29/2013   Hyperlipidemia 10/29/2013   Iron  (Fe) deficiency anemia 08/08/2013   Vitamin B 12 deficiency 08/08/2013   GERD (gastroesophageal reflux disease) 09/08/2011   Weakness 02/22/2011   Depression 02/22/2011   MENIERE'S DISEASE 04/30/2009   Vitamin D  deficiency 02/05/2009   Venous (peripheral) insufficiency 12/12/2007   RENAL CALCULUS 12/12/2007   DIZZINESS 12/12/2007   HYPERCHOLESTEROLEMIA  09/16/2007   Anxiety state 09/16/2007   HYPERTENSION, BENIGN 09/16/2007   Allergic rhinitis 09/16/2007   IRRITABLE BOWEL SYNDROME 09/16/2007   Myalgia and myositis 09/16/2007    PCP: Benjamine Aland, MD   REFERRING PROVIDER: Glena Harlene HERO, FNP  REFERRING DIAG: [phone] (ICD-10-CM) - Physical deconditioning  THERAPY DIAG:  Difficulty in walking, not elsewhere classified  Muscle weakness (generalized)  Decreased activity tolerance  Rationale for Evaluation and Treatment: Rehabilitation  ONSET DATE: 08/23/2023  SUBJECTIVE:   SUBJECTIVE STATEMENT: Patient states she had echocardiogram since last visit.  She states she had a good report.  The left side of her heart is doing very well and the right side is still a little weak   PERTINENT HISTORY: na PAIN:  Are you having pain? No  PRECAUTIONS: Other: RECENT PE, MONITOR O2 AND HR!  MD has requested BP monitoring as patient was low at her last 2 visits:  09/28/23: BP 150/89 HR: 69  RED FLAGS: None   WEIGHT BEARING RESTRICTIONS: No  FALLS:  Has patient fallen in last 6 months? No  LIVING ENVIRONMENT: Lives with: lives alone Lives in: House/apartment Stairs: Yes: Internal: 12 steps; on right going up and External: 4 steps; on left going up Has following equipment at home: None  OCCUPATION: get details next visit  PLOF: Independent, Independent with basic ADLs, Independent with household mobility without device, Independent with community mobility without device, Independent with homemaking with ambulation, Independent with gait, and Independent with transfers  PATIENT GOALS: To be able to get back to things I enjoy doing: working part time, sorority events, doing everything independently, driving  NEXT MD VISIT: PRN  OBJECTIVE:  Note: Objective measures were completed at Evaluation unless otherwise noted.  DIAGNOSTIC FINDINGS: na  COGNITION: Overall cognitive status:  patient wasn't talking much and had  flat affect, dtr spoke for patient most of the session      SENSATION: WFL   MUSCLE LENGTH: Generally good flexibility  POSTURE: rounded shoulders and forward head  PALPATION: na  LOWER EXTREMITY ROM:  WNL  LOWER EXTREMITY MMT:  Extremely weak all over: inconclusive due to patient effort- she wasn't feeling well Generally 3 to 3+/5 upper and lower extremities   FUNCTIONAL TESTS: Target HR 122,  Max HR 152 5 times sit to stand: 33.91 With use of hands with O2 sat at no lower than 95% and HR at max 114 Timed up and go (TUG): 19.62 with O2 drop to no lower than 97% and HR max at 111   10/19/23: TUG 11.39 seconds    5x sit to stand: 33.68 without UE support   6 minute walk test: 890 feet- 1 seated rest break- 02 99% after  GAIT: Distance walked: 30 feet Assistive device utilized: None Level of assistance: SBA Comments: slow, low energy   TODAY'S TREATMENT:  DATE: 11/17/23 NuStep: level 3 x 8 minutes Standing hip extension 2 x 10 each LE Standing hip abduction 2 x 10 each LE Lateral band walks x 3 laps at barre with blue loop Step ups with right LE x 10, then left LE x 10 (4 step) Standing march x 20 Squats holding barre 2 x 10 Seated bicep curls with 2 lb 2 x 10 Seated overhead press (alternating steam engines) with 2 lb 2 x 10 Seated alternating fwd punches 2 x 10 with 2 lbs Standing gun slingers with 2 lbs x 10 Standing skiers with 2 lbs 2 x 10 Standing curtsy's holding onto barre 2 x 10  DATE: 10/19/23 NuStep: level 1x 6 minutes- PT present to discuss progress 02 97%, HR 81 Sit to stand 2 x 5 without UE support  Seated LAQ with 3 lbs 2 x 10 each LE  Seated march x 20 with 3 lbs 6 min walk test-see above, 5x sit to stand, TUG Seated bicep curls with 2 lb x 20 Seated overhead press (alternating steam engines) with 2 lb 2 x 10                                                                                                                      DATE:  10/05/23 Recumbent Bike x 5 min level 1 (PT present to discuss status) Sit to stand 2 x 7 without UE support with 5 lb kb Seated LAQ with 3 lbs 2 x 10 each LE  Seated march x 20 with 3 lbs Seated hip ER with 3 lbs 2 x 10 each LE Standing hip extension 3# 2 x 10 Standing hip abduction 3# 2 x 10 Seated clam x20 with yellow loop Seated bicep curls with 2 lb x 20 Seated overhead press (alternating steam engines) with 1 lb 2 x 10 Standing lateral band walks with blue loop x 3 laps at barre Perceived exertion 3/10   PATIENT EDUCATION:  Education details: initiated HEP and educated on how to determine target heart rate and max heart rate Person educated: Patient and Child(ren) Education method: Programmer, Multimedia, Facilities Manager, Verbal cues, and Handouts Education comprehension: verbalized understanding, returned demonstration, and verbal cues required  HOME EXERCISE PROGRAM: Access Code: [email or web address] URL: [email or web address] Date: 08/24/2023 Prepared by: Delon Haddock  Exercises - Seated Overhead Press  - 1 x daily - 7 x weekly - 2 sets - 10 reps - Seated Bicep Curls Supinated with Dumbbells  - 1 x daily - 7 x weekly - 2 sets - 10 reps - Seated Long Arc Quad  - 1 x daily - 7 x weekly - 2 sets - 10 reps - Seated March  - 1 x daily - 7 x weekly - 1 sets - 20 reps - Sit to Stand with Armchair  - 1 x daily - 7 x weekly - 2 sets - 5 reps  ASSESSMENT:  CLINICAL IMPRESSION: Patient was able to tolerate more standing and cardiac focused activity today with minimal rest  breaks.  Perceived exertion was around 3-4 with the more difficult tasks. She was experiencing some right knee pain upon entering clinic but had no pain during exercises including functional tasks such as step ups.   She would benefit from continuing skilled PT for upper and lower extremity strengthening along with balance and gait training to restore patient to her prior level of function.    OBJECTIVE IMPAIRMENTS:  cardiopulmonary status limiting activity, decreased activity tolerance, decreased balance, decreased endurance, decreased knowledge of use of DME, decreased mobility, difficulty walking, decreased strength, and impaired UE functional use.   ACTIVITY LIMITATIONS: carrying, lifting, bending, standing, squatting, stairs, transfers, bed mobility, continence, bathing, toileting, dressing, reach over head, and hygiene/grooming  PARTICIPATION LIMITATIONS: meal prep, cleaning, laundry, driving, shopping, community activity, occupation, yard work, and church  PERSONAL FACTORS: Fitness are also affecting patient's functional outcome.   REHAB POTENTIAL: Good  CLINICAL DECISION MAKING: Stable/uncomplicated  EVALUATION COMPLEXITY: Low   GOALS: Goals reviewed with patient? Yes  SHORT TERM GOALS: Target date: 09/22/2023  Patient to be independent with initial HEP Baseline: Goal status: MET 09/20/23  2.  Patient to be able to prepare small meal for herself independently Baseline: able to do this (10/19/23) Goal status: MET  3.  Patient to report increased appetite and improved nutrition Baseline:  Goal status: MET 10/05/23   LONG TERM GOALS: Target date: 12/14/23   Patient to be independent with advanced HEP  Baseline:  Goal status: In progress   2.  Perform 5x sit to stand in < or = to 18 seconds without UE support Baseline: 33.68 without UE support  Goal status: In progress   3.  Patient to show improvement of at least 3 seconds on TUG test Baseline: 11.39 seconds (10/19/23) Goal status: MET  4.  Patient to demonstrate good heel to toe progression and no loss of balance for 300 feet Baseline:  Goal status: MET  5.  Patient to be able to resume driving Baseline: has not done this yet, her family is watching her (10/19/23) Goal status: In progress   6.  Pt will ambulate > or = to 1200 feet in 6 minutes to improve community independence and allow for return to work Baseline: 890  feet  Goal status: INITIAL  7. Negotiate steps at home with alternating pattern with ascending with use of 1 rail  Baseline: step-to with use of 2 rails   Goal: INITIAL    PLAN:  PT FREQUENCY: 1-2x/week  PT DURATION: 8 weeks  PLANNED INTERVENTIONS: 97146- PT Re-evaluation, 97110-Therapeutic exercises, 97530- Therapeutic activity, 97112- Neuromuscular re-education, 97535- Self Care, [id number]- Manual therapy, [phone]- Gait training, [phone]- Canalith repositioning, [phone]- Aquatic Therapy, Patient/Family education, Balance training, Stair training, Taping, Dry Needling, Joint mobilization, Spinal mobilization, Vestibular training, Visual/preceptual remediation/compensation, DME instructions, Cryotherapy, Moist heat, Therapeutic exercises, Therapeutic activity, Neuromuscular re-education, Gait training, and Self Care  PLAN FOR NEXT SESSION: Continue Nustep,work on balance, strength and endurance progression to improve independence and return to work, work on ascending and descending steps, possible trial of treadmill or UBE.       Delon B. Montrail Mehrer, PT 11/17/23 5:14 PM [facility] [address], [address] [place], [place] [address] Phone # [phone] Fax [phone]

## 2023-11-23 ENCOUNTER — Other Ambulatory Visit (HOSPITAL_COMMUNITY): Payer: Self-pay

## 2023-11-23 ENCOUNTER — Ambulatory Visit: Payer: Medicare PPO

## 2023-11-23 DIAGNOSIS — M6281 Muscle weakness (generalized): Secondary | ICD-10-CM

## 2023-11-23 DIAGNOSIS — R6889 Other general symptoms and signs: Secondary | ICD-10-CM

## 2023-11-23 DIAGNOSIS — R262 Difficulty in walking, not elsewhere classified: Secondary | ICD-10-CM

## 2023-11-23 DIAGNOSIS — R293 Abnormal posture: Secondary | ICD-10-CM

## 2023-11-23 DIAGNOSIS — R252 Cramp and spasm: Secondary | ICD-10-CM | POA: Diagnosis not present

## 2023-11-23 NOTE — Progress Notes (Signed)
 Paramedicine Encounter    Patient ID: Jocelyn [name], female    DOB: 14-Nov-1956, 68 y.o.   MRN: [id number]   Complaints-none   Edema-none  Compliance with meds-yes   Pill box filled-yes by daughter  If so, by whom-daughter   Refills needed-none at the moment-daughter handles   Pt reports she is doing well. She is still getting PT once a week. I have not seen her in several wks due to holiday schedule and her PT schedule.  Pt had ECHO done a few wks ago, she has f/u in clinic in a couple wks.  Pt denies increased sob, she is doing better with stairs, but still progressing with that.  Pt denies dizziness, no c/p-she has deep tenderness to her chest where CPR was given.  No edema noted. She is eating ok, she doesn't have a big appetite but she reports eating ok.  Pt denies any bleeding when using bathroom.  She still has not heard back on any 02 need/use/equipment.  Does she even still need?   She reports the [employer] has tried reaching out to doc and they havent heard anything either.   She reports trying to get more fluids in, she drinks about 2 cups a day.   Will f/u next week.  B/p elevated but no meds today yet.   BP (!) 140/80   Pulse 86   Resp 16   Wt 150 lb (68 kg)   SpO2 98%   BMI 27.44 kg/m  Weight yesterday-? Last visit weight-150   Patient Care Team: Benjamine Aland, MD as PCP - General (Family Medicine) Dann Candyce RAMAN, MD as PCP - Cardiology (Cardiology)  Patient Active Problem List   Diagnosis Date Noted   Physical deconditioning 08/23/2023   Acute respiratory failure with hypoxia (HCC) 08/05/2023   Pulmonary embolism (HCC) 08/03/2023   Pulmonary edema 08/03/2023   Degenerative arthritis of knee 08/20/2021   Hx of pleural effusion    Dyspnea 08/16/2018   Fall on or from sidewalk curb, initial encounter 05/31/2018   History of acute inferior wall MI 08/22/2017   Diabetes mellitus (HCC)    S/P right coronary artery (RCA) stent placement     Overweight 05/05/2017   Diabetes mellitus type 2, controlled, without complications (HCC) 12/16/2016   Asthmatic bronchitis , chronic (HCC) 05/10/2016   Polymyositis (HCC) 09/15/2015   History of sarcoidosis 09/15/2015   Falling 03/13/2015   Edema 02/13/2014   Coronary artery disease involving native heart without angina pectoris 10/29/2013   Hyperlipidemia 10/29/2013   Iron  (Fe) deficiency anemia 08/08/2013   Vitamin B 12 deficiency 08/08/2013   GERD (gastroesophageal reflux disease) 09/08/2011   Weakness 02/22/2011   Depression 02/22/2011   MENIERE'S DISEASE 04/30/2009   Vitamin D  deficiency 02/05/2009   Venous (peripheral) insufficiency 12/12/2007   RENAL CALCULUS 12/12/2007   DIZZINESS 12/12/2007   HYPERCHOLESTEROLEMIA 09/16/2007   Anxiety state 09/16/2007   HYPERTENSION, BENIGN 09/16/2007   Allergic rhinitis 09/16/2007   IRRITABLE BOWEL SYNDROME 09/16/2007   Myalgia and myositis 09/16/2007    Current Outpatient Medications:    allopurinol (ZYLOPRIM) 100 MG tablet, Take 1 tablet by mouth daily as needed., Disp: , Rfl:    ALPRAZolam  (XANAX ) 0.5 MG tablet, Take 0.5 mg by mouth 3 (three) times daily., Disp: , Rfl:    apixaban  (ELIQUIS ) 5 MG TABS tablet, Take 10 mg by mouth twice daily, then on 08/17/2023-switch to 5 mg by mouth twice daily., Disp: 180 tablet, Rfl: 3   azaTHIOprine  (IMURAN )  50 MG tablet, Take 1 tablet by mouth daily., Disp: , Rfl:    Empagliflozin -metFORMIN  HCl ER 25-1000 MG TB24, Take 1 tablet by mouth daily., Disp: , Rfl:    GEMTESA 75 MG TABS, Take 1 tablet by mouth daily., Disp: , Rfl:    icosapent  Ethyl (VASCEPA ) 1 g capsule, TAKE 2 CAPSULES(2 GRAMS) BY MOUTH TWICE DAILY, Disp: 360 capsule, Rfl: 3   NEXLETOL  180 MG TABS, TAKE 1 TABLET BY MOUTH DAILY, Disp: 90 tablet, Rfl: 1   ONETOUCH VERIO test strip, CHECK BLOOD SUGAR TID AS DIRECTED, Disp: , Rfl:    pantoprazole  (PROTONIX ) 40 MG tablet, Take 1 tablet (40 mg total) by mouth daily., Disp: 30 tablet, Rfl: 0    PRALUENT  150 MG/ML SOAJ, ADMINISTER 1 ML UNDER THE SKIN EVERY 14 DAYS, Disp: 6 mL, Rfl: 3   spironolactone  (ALDACTONE ) 25 MG tablet, TAKE 1 TABLET BY MOUTH EVERY DAY, Disp: 90 tablet, Rfl: 2   benzonatate  (TESSALON ) 100 MG capsule, Take 1 capsule (100 mg total) by mouth 3 (three) times daily as needed for cough. (Patient not taking: Reported on 11/23/2023), Disp: 30 capsule, Rfl: 1   metoprolol  succinate (TOPROL -XL) 100 MG 24 hr tablet, Take 1 tablet (100 mg total) by mouth daily. Take with or immediately following a meal., Disp: 90 tablet, Rfl: 3   nitroGLYCERIN  (NITROSTAT ) 0.4 MG SL tablet, Place 1 tablet (0.4 mg total) under the tongue every 5 (five) minutes as needed for chest pain. (Patient not taking: Reported on 10/26/2023), Disp: 25 tablet, Rfl: 2 Allergies  Allergen Reactions   Actos [Pioglitazone] Other (See Comments)    REACTION: pt states INTOL w/ edema   Codeine  Other (See Comments)    REACTION: vomiting   Januvia [Sitagliptin] Nausea Only   Lactose Intolerance (Gi) Diarrhea   Morphine Nausea Only and Nausea And Vomiting    REACTION: vomiting REACTION: vomiting   Repatha  [Evolocumab ]     WAS NOT EFFECTIVE IN LOWERING LDL      Social History   Socioeconomic History   Marital status: Single    Spouse name: Not on file   Number of children: 1   Years of education: Not on file   Highest education level: Not on file  Occupational History   Occupation: Retired Runner, Broadcasting/film/video   Tobacco Use   Smoking status: Never   Smokeless tobacco: Never   Tobacco comments:    Daily Caffeine - 1  Exercise 2-3 times/weekly  Vaping Use   Vaping status: Never Used  Substance and Sexual Activity   Alcohol use: No    Alcohol/week: 0.0 standard drinks of alcohol   Drug use: No   Sexual activity: Yes    Birth control/protection: None  Other Topics Concern   Not on file  Social History Narrative   Exercises 2-3 times weekly   Caffeine Use: 1 daily (tea or Pepsi)   Lives alone in a one story  home.  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     Social Drivers of Corporate Investment Banker Strain: Low Risk  (08/05/2023)   Overall Financial Resource Strain (CARDIA)    Difficulty of Paying Living Expenses: Not hard at all  Food Insecurity: No Food Insecurity (08/05/2023)   Hunger Vital Sign    Worried About Running Out of Food in the Last Year: Never true    Ran Out of Food in the Last Year: Never true  Transportation Needs: No Transportation Needs (08/05/2023)   PRAPARE - Transportation  Lack of Transportation (Medical): No    Lack of Transportation (Non-Medical): No  Physical Activity: Not on file  Stress: No Stress Concern Present (08/05/2023)   [employer] of Occupational Health - Occupational Stress Questionnaire    Feeling of Stress : Not at all  Social Connections: Not on file  Intimate Partner Violence: Not At Risk (08/05/2023)   Humiliation, Afraid, Rape, and Kick questionnaire    Fear of Current or Ex-Partner: No    Emotionally Abused: No    Physically Abused: No    Sexually Abused: No    Physical Exam      Future Appointments  Date Time Provider Department Center  11/30/2023  3:30 PM Harvey Delon NOVAK, PT [facility] None  12/07/2023  3:30 PM Harvey Delon NOVAK, PT [facility] None  12/08/2023  2:00 PM Rolan Ezra RAMAN, MD [facility] None  12/14/2023  3:30 PM Harvey Delon NOVAK, PT [facility] None  01/30/2024  3:00 PM Verlin Lonni BIRCH, MD [facility]  07/30/2024  1:45 PM Gregg Lek, MD [facility] None       [name], Paramedic [phone] [facility] Paramedic  11/23/23

## 2023-11-23 NOTE — Therapy (Signed)
 OUTPATIENT PHYSICAL THERAPY LOWER EXTREMITY TREATMENT   Patient Name: Jocelyn [name] MRN: [id number] DOB:06/11/1956, 68 y.o., female Today's Date: 11/23/2023  END OF SESSION:  PT End of Session - 11/23/23 1541     Visit Number 8    Number of Visits 14    Date for PT Re-Evaluation 12/14/23    Authorization Type [employer]- requested more visits    Authorization - Visit Number 8    Authorization - Number of Visits 14    Progress Note Due on Visit 12    PT Start Time 1538    PT Stop Time 1620    PT Time Calculation (min) 42 min    Activity Tolerance Patient tolerated treatment well    Behavior During Therapy WFL for tasks assessed/performed              Past Medical History:  Diagnosis Date   Acute bronchitis    Allergic rhinitis, cause unspecified    Anemia    Anxiety    B12 deficiency    BV (bacterial vaginosis) 06/22/1996   Calculus of kidney    Calculus of kidney    Coronary atherosclerosis of unspecified type of vessel, native or graft    Dizziness    Essential hypertension, benign    Fibroid 2003   Fibromyalgia    H/O dysmenorrhea 2008   H/O varicella    Headache(784.0)    frequently   HSV-2 infection 2009   Hyperplastic colon polyp 05/16/2014   Irritable bowel syndrome    Meniere's disease, unspecified    Menses, irregular 2003   Myalgia and myositis, unspecified    Myocardial infarction (HCC)    Obstructive sleep apnea (adult) (pediatric)    Perimenopausal symptoms 2003   Pure hypercholesterolemia    Sarcoidosis    Type II or unspecified type diabetes mellitus without mention of complication, not stated as uncontrolled    Unspecified venous (peripheral) insufficiency    Vitamin D  deficiency    Vulvitis 2010   Yeast infection    Past Surgical History:  Procedure Laterality Date   COLONOSCOPY     CORONARY STENT INTERVENTION N/A 06/15/2017   Procedure: CORONARY STENT INTERVENTION;  Surgeon: Dann Candyce RAMAN, MD;  Location: [facility];  Service: Cardiovascular;  Laterality: N/A;   heart catherization     IR ANGIOGRAM PULMONARY BILATERAL SELECTIVE  08/04/2023   IR ANGIOGRAM SELECTIVE EACH ADDITIONAL VESSEL  08/04/2023   IR ANGIOGRAM SELECTIVE EACH ADDITIONAL VESSEL  08/04/2023   IR THROMBECT PRIM MECH INIT (INCLU) MOD SED  08/04/2023   IR US  GUIDE VASC ACCESS RIGHT  08/04/2023   LEFT HEART CATH AND CORONARY ANGIOGRAPHY N/A 06/15/2017   Procedure: LEFT HEART CATH AND CORONARY ANGIOGRAPHY;  Surgeon: Dann Candyce RAMAN, MD;  Location: [facility];  Service: Cardiovascular;  Laterality: N/A;   RADIOLOGY WITH ANESTHESIA N/A 08/04/2023   Procedure: IR WITH ANESTHESIA;  Surgeon: Dolphus Carrion, MD;  Location: [facility];  Service: Radiology;  Laterality: N/A;   TONSILLECTOMY     Patient Active Problem List   Diagnosis Date Noted   Physical deconditioning 08/23/2023   Acute respiratory failure with hypoxia (HCC) 08/05/2023   Pulmonary embolism (HCC) 08/03/2023   Pulmonary edema 08/03/2023   Degenerative arthritis of knee 08/20/2021   Hx of pleural effusion    Dyspnea 08/16/2018   Fall on or from sidewalk curb, initial encounter 05/31/2018   History of acute inferior wall MI 08/22/2017   Diabetes mellitus (HCC)  S/P right coronary artery (RCA) stent placement    Overweight 05/05/2017   Diabetes mellitus type 2, controlled, without complications (HCC) 12/16/2016   Asthmatic bronchitis , chronic (HCC) 05/10/2016   Polymyositis (HCC) 09/15/2015   History of sarcoidosis 09/15/2015   Falling 03/13/2015   Edema 02/13/2014   Coronary artery disease involving native heart without angina pectoris 10/29/2013   Hyperlipidemia 10/29/2013   Iron  (Fe) deficiency anemia 08/08/2013   Vitamin B 12 deficiency 08/08/2013   GERD (gastroesophageal reflux disease) 09/08/2011   Weakness 02/22/2011   Depression 02/22/2011   MENIERE'S DISEASE 04/30/2009   Vitamin D  deficiency 02/05/2009   Venous (peripheral) insufficiency 12/12/2007    RENAL CALCULUS 12/12/2007   DIZZINESS 12/12/2007   HYPERCHOLESTEROLEMIA 09/16/2007   Anxiety state 09/16/2007   HYPERTENSION, BENIGN 09/16/2007   Allergic rhinitis 09/16/2007   IRRITABLE BOWEL SYNDROME 09/16/2007   Myalgia and myositis 09/16/2007    PCP: Benjamine Aland, MD   REFERRING PROVIDER: Glena Harlene HERO, FNP  REFERRING DIAG: [phone] (ICD-10-CM) - Physical deconditioning  THERAPY DIAG:  Muscle weakness (generalized)  Decreased activity tolerance  Difficulty in walking, not elsewhere classified  Abnormal posture  Rationale for Evaluation and Treatment: Rehabilitation  ONSET DATE: 08/23/2023  SUBJECTIVE:   SUBJECTIVE STATEMENT: Patient reports she was sore after last visit.   PERTINENT HISTORY: na PAIN:  Are you having pain? No  PRECAUTIONS: Other: RECENT PE, MONITOR O2 AND HR!  MD has requested BP monitoring as patient was low at her last 2 visits:  09/28/23: BP 150/89 HR: 69  RED FLAGS: None   WEIGHT BEARING RESTRICTIONS: No  FALLS:  Has patient fallen in last 6 months? No  LIVING ENVIRONMENT: Lives with: lives alone Lives in: House/apartment Stairs: Yes: Internal: 12 steps; on right going up and External: 4 steps; on left going up Has following equipment at home: None  OCCUPATION: get details next visit  PLOF: Independent, Independent with basic ADLs, Independent with household mobility without device, Independent with community mobility without device, Independent with homemaking with ambulation, Independent with gait, and Independent with transfers  PATIENT GOALS: To be able to get back to things I enjoy doing: working part time, sorority events, doing everything independently, driving  NEXT MD VISIT: PRN  OBJECTIVE:  Note: Objective measures were completed at Evaluation unless otherwise noted.  DIAGNOSTIC FINDINGS: na  COGNITION: Overall cognitive status:  patient wasn't talking much and had flat affect, dtr spoke for patient most  of the session      SENSATION: WFL   MUSCLE LENGTH: Generally good flexibility  POSTURE: rounded shoulders and forward head  PALPATION: na  LOWER EXTREMITY ROM:  WNL  LOWER EXTREMITY MMT:  Extremely weak all over: inconclusive due to patient effort- she wasn't feeling well Generally 3 to 3+/5 upper and lower extremities   FUNCTIONAL TESTS: Target HR 122,  Max HR 152 5 times sit to stand: 33.91 With use of hands with O2 sat at no lower than 95% and HR at max 114 Timed up and go (TUG): 19.62 with O2 drop to no lower than 97% and HR max at 111   10/19/23: TUG 11.39 seconds    5x sit to stand: 33.68 without UE support   6 minute walk test: 890 feet- 1 seated rest break- 02 99% after  GAIT: Distance walked: 30 feet Assistive device utilized: None Level of assistance: SBA Comments: slow, low energy   TODAY'S TREATMENT:        DATE: 11/23/23 NuStep: level 3 x 8  minutes Standing hip extension 2 x 10 each LE Standing hip abduction 2 x 10 each LE Lateral band walks x 3 laps at barre with blue loop Seated clam with blue loop x 20 Seated LAQ with 2.5 lb ankle weights 2 x 10 each LE Seated march with 2.5 lbs x 20 Seated hip ER with 2.5 lbs 2 x 10 each Seated bicep curls with 1 lb 2 x 10 Seated fwd punches with 1 lb 2 x 10 Seated tricep press with red tband x 20 each UE Step ups with right LE 2 x 10, then left LE 2 x 10 (4 step)   DATE: 11/17/23 NuStep: level 3 x 8 minutes Standing hip extension 2 x 10 each LE Standing hip abduction 2 x 10 each LE Lateral band walks x 3 laps at barre with blue loop Step ups with right LE x 10, then left LE x 10 (4 step) Standing march x 20 Squats holding barre 2 x 10 Seated bicep curls with 2 lb 2 x 10 Seated overhead press (alternating steam engines) with 2 lb 2 x 10 Seated alternating fwd punches 2 x 10 with 2 lbs Standing gun slingers with 2 lbs x 10 Standing skiers with 2 lbs 2 x 10 Standing curtsy's holding onto barre 2 x  10  DATE: 10/19/23 NuStep: level 1x 6 minutes- PT present to discuss progress 02 97%, HR 81 Sit to stand 2 x 5 without UE support  Seated LAQ with 3 lbs 2 x 10 each LE  Seated march x 20 with 3 lbs 6 min walk test-see above, 5x sit to stand, TUG Seated bicep curls with 2 lb x 20 Seated overhead press (alternating steam engines) with 2 lb 2 x 10                                                                                                                       PATIENT EDUCATION:  Education details: initiated HEP and educated on how to determine target heart rate and max heart rate Person educated: Patient and Psychologist, Sport And Exercise) Education method: Programmer, Multimedia, Facilities Manager, Verbal cues, and Handouts Education comprehension: verbalized understanding, returned demonstration, and verbal cues required  HOME EXERCISE PROGRAM: Access Code: [email or web address] URL: [email or web address] Date: 08/24/2023 Prepared by: Delon Haddock  Exercises - Seated Overhead Press  - 1 x daily - 7 x weekly - 2 sets - 10 reps - Seated Bicep Curls Supinated with Dumbbells  - 1 x daily - 7 x weekly - 2 sets - 10 reps - Seated Long Arc Quad  - 1 x daily - 7 x weekly - 2 sets - 10 reps - Seated March  - 1 x daily - 7 x weekly - 1 sets - 20 reps - Sit to Stand with Armchair  - 1 x daily - 7 x weekly - 2 sets - 5 reps  ASSESSMENT:  CLINICAL IMPRESSION: Patient was sore from last visit so we lowered her resistance and held on the more difficult  exercises from last visit.  Her primary soreness was in the arms.  She was able to complete all tasks today with ease but does still fatigue somewhat.  Overall she feels she is about 80% better than when she started therapy.     She would benefit from continuing skilled PT for upper and lower extremity strengthening along with balance and gait training to restore patient to her prior level of function.    OBJECTIVE IMPAIRMENTS: cardiopulmonary status limiting activity,  decreased activity tolerance, decreased balance, decreased endurance, decreased knowledge of use of DME, decreased mobility, difficulty walking, decreased strength, and impaired UE functional use.   ACTIVITY LIMITATIONS: carrying, lifting, bending, standing, squatting, stairs, transfers, bed mobility, continence, bathing, toileting, dressing, reach over head, and hygiene/grooming  PARTICIPATION LIMITATIONS: meal prep, cleaning, laundry, driving, shopping, community activity, occupation, yard work, and church  PERSONAL FACTORS: Fitness are also affecting patient's functional outcome.   REHAB POTENTIAL: Good  CLINICAL DECISION MAKING: Stable/uncomplicated  EVALUATION COMPLEXITY: Low   GOALS: Goals reviewed with patient? Yes  SHORT TERM GOALS: Target date: 09/22/2023  Patient to be independent with initial HEP Baseline: Goal status: MET 09/20/23  2.  Patient to be able to prepare small meal for herself independently Baseline: able to do this (10/19/23) Goal status: MET  3.  Patient to report increased appetite and improved nutrition Baseline:  Goal status: MET 10/05/23   LONG TERM GOALS: Target date: 12/14/23   Patient to be independent with advanced HEP  Baseline:  Goal status: In progress   2.  Perform 5x sit to stand in < or = to 18 seconds without UE support Baseline: 33.68 without UE support  Goal status: In progress   3.  Patient to show improvement of at least 3 seconds on TUG test Baseline: 11.39 seconds (10/19/23) Goal status: MET  4.  Patient to demonstrate good heel to toe progression and no loss of balance for 300 feet Baseline:  Goal status: MET  5.  Patient to be able to resume driving Baseline: has not done this yet, her family is watching her (10/19/23) Goal status: In progress   6.  Pt will ambulate > or = to 1200 feet in 6 minutes to improve community independence and allow for return to work Baseline: 890 feet  Goal status: In Progress  7.  Negotiate steps at home with alternating pattern with ascending with use of 1 rail  Baseline: step-to with use of 2 rails   Goal: In Progress   PLAN:  PT FREQUENCY: 1-2x/week  PT DURATION: 8 weeks  PLANNED INTERVENTIONS: 97146- PT Re-evaluation, 97110-Therapeutic exercises, 97530- Therapeutic activity, 97112- Neuromuscular re-education, 97535- Self Care, 02859- Manual therapy, [phone]- Gait training, [id number]- Canalith repositioning, [phone]- Aquatic Therapy, Patient/Family education, Balance training, Stair training, Taping, Dry Needling, Joint mobilization, Spinal mobilization, Vestibular training, Visual/preceptual remediation/compensation, DME instructions, Cryotherapy, Moist heat, Therapeutic exercises, Therapeutic activity, Neuromuscular re-education, Gait training, and Self Care  PLAN FOR NEXT SESSION: Continue Nustep,work on balance, strength and endurance progression to improve independence and return to work, work on ascending and descending steps, possible trial of treadmill or UBE.       Delon B. Sangeeta Youse, PT 11/23/23 4:28 PM [facility] [address] [place], [place] [address] Phone # [phone] Fax [phone]

## 2023-11-29 ENCOUNTER — Telehealth (HOSPITAL_COMMUNITY): Payer: Self-pay

## 2023-11-29 NOTE — Telephone Encounter (Signed)
 Pts daughter called to report that pt is sick with cold and needs to skip our home visit this week, she isnt feeling well.   Will f/u next week.   Kerry Hough, EMT-Paramedic  [phone] 11/29/2023

## 2023-12-01 DIAGNOSIS — I2692 Saddle embolus of pulmonary artery without acute cor pulmonale: Secondary | ICD-10-CM | POA: Diagnosis not present

## 2023-12-01 DIAGNOSIS — E1169 Type 2 diabetes mellitus with other specified complication: Secondary | ICD-10-CM | POA: Diagnosis not present

## 2023-12-01 DIAGNOSIS — I10 Essential (primary) hypertension: Secondary | ICD-10-CM | POA: Diagnosis not present

## 2023-12-01 DIAGNOSIS — D649 Anemia, unspecified: Secondary | ICD-10-CM | POA: Diagnosis not present

## 2023-12-01 DIAGNOSIS — E782 Mixed hyperlipidemia: Secondary | ICD-10-CM | POA: Diagnosis not present

## 2023-12-01 DIAGNOSIS — J399 Disease of upper respiratory tract, unspecified: Secondary | ICD-10-CM | POA: Diagnosis not present

## 2023-12-01 DIAGNOSIS — Z6827 Body mass index (BMI) 27.0-27.9, adult: Secondary | ICD-10-CM | POA: Diagnosis not present

## 2023-12-01 DIAGNOSIS — F339 Major depressive disorder, recurrent, unspecified: Secondary | ICD-10-CM | POA: Diagnosis not present

## 2023-12-07 ENCOUNTER — Ambulatory Visit: Payer: Medicare PPO

## 2023-12-07 DIAGNOSIS — R293 Abnormal posture: Secondary | ICD-10-CM | POA: Diagnosis not present

## 2023-12-07 DIAGNOSIS — M6281 Muscle weakness (generalized): Secondary | ICD-10-CM

## 2023-12-07 DIAGNOSIS — R262 Difficulty in walking, not elsewhere classified: Secondary | ICD-10-CM

## 2023-12-07 DIAGNOSIS — R252 Cramp and spasm: Secondary | ICD-10-CM

## 2023-12-07 DIAGNOSIS — R6889 Other general symptoms and signs: Secondary | ICD-10-CM | POA: Diagnosis not present

## 2023-12-07 NOTE — Therapy (Signed)
OUTPATIENT PHYSICAL THERAPY LOWER EXTREMITY TREATMENT   Patient Name: Jocelyn [name] MRN: [id number] DOB:11/25/1955, 68 y.o., female Today's Date: 12/07/2023  END OF SESSION:  PT End of Session - 12/07/23 1539     Visit Number 9    Number of Visits 14    Date for PT Re-Evaluation 12/14/23    Authorization Type [employer] approved 8 additional visits until 12/14/23    Authorization - Visit Number 9    Authorization - Number of Visits 14    Progress Note Due on Visit 10    PT Start Time 1535    PT Stop Time 1615    PT Time Calculation (min) 40 min    Activity Tolerance Patient tolerated treatment well    Behavior During Therapy WFL for tasks assessed/performed              Past Medical History:  Diagnosis Date   Acute bronchitis    Allergic rhinitis, cause unspecified    Anemia    Anxiety    B12 deficiency    BV (bacterial vaginosis) 06/22/1996   Calculus of kidney    Calculus of kidney    Coronary atherosclerosis of unspecified type of vessel, native or graft    Dizziness    Essential hypertension, benign    Fibroid 2003   Fibromyalgia    H/O dysmenorrhea 2008   H/O varicella    Headache(784.0)    frequently   HSV-2 infection 2009   Hyperplastic colon polyp 05/16/2014   Irritable bowel syndrome    Meniere's disease, unspecified    Menses, irregular 2003   Myalgia and myositis, unspecified    Myocardial infarction (HCC)    Obstructive sleep apnea (adult) (pediatric)    Perimenopausal symptoms 2003   Pure hypercholesterolemia    Sarcoidosis    Type II or unspecified type diabetes mellitus without mention of complication, not stated as uncontrolled    Unspecified venous (peripheral) insufficiency    Vitamin D deficiency    Vulvitis 2010   Yeast infection    Past Surgical History:  Procedure Laterality Date   COLONOSCOPY     CORONARY STENT INTERVENTION N/A 06/15/2017   Procedure: CORONARY STENT INTERVENTION;  Surgeon: Corky Crafts, MD;   Location: [facility];  Service: Cardiovascular;  Laterality: N/A;   heart catherization     IR ANGIOGRAM PULMONARY BILATERAL SELECTIVE  08/04/2023   IR ANGIOGRAM SELECTIVE EACH ADDITIONAL VESSEL  08/04/2023   IR ANGIOGRAM SELECTIVE EACH ADDITIONAL VESSEL  08/04/2023   IR THROMBECT PRIM MECH INIT (INCLU) MOD SED  08/04/2023   IR US GUIDE VASC ACCESS RIGHT  08/04/2023   LEFT HEART CATH AND CORONARY ANGIOGRAPHY N/A 06/15/2017   Procedure: LEFT HEART CATH AND CORONARY ANGIOGRAPHY;  Surgeon: Corky Crafts, MD;  Location: [facility];  Service: Cardiovascular;  Laterality: N/A;   RADIOLOGY WITH ANESTHESIA N/A 08/04/2023   Procedure: IR WITH ANESTHESIA;  Surgeon: Julieanne Cotton, MD;  Location: [facility];  Service: Radiology;  Laterality: N/A;   TONSILLECTOMY     Patient Active Problem List   Diagnosis Date Noted   Physical deconditioning 08/23/2023   Acute respiratory failure with hypoxia (HCC) 08/05/2023   Pulmonary embolism (HCC) 08/03/2023   Pulmonary edema 08/03/2023   Degenerative arthritis of knee 08/20/2021   Hx of pleural effusion    Dyspnea 08/16/2018   Fall on or from sidewalk curb, initial encounter 05/31/2018   History of acute inferior wall MI 08/22/2017   Diabetes  mellitus (HCC)    S/P right coronary artery (RCA) stent placement    Overweight 05/05/2017   Diabetes mellitus type 2, controlled, without complications (HCC) 12/16/2016   Asthmatic bronchitis , chronic (HCC) 05/10/2016   Polymyositis (HCC) 09/15/2015   History of sarcoidosis 09/15/2015   Falling 03/13/2015   Edema 02/13/2014   Coronary artery disease involving native heart without angina pectoris 10/29/2013   Hyperlipidemia 10/29/2013   Iron (Fe) deficiency anemia 08/08/2013   Vitamin B 12 deficiency 08/08/2013   GERD (gastroesophageal reflux disease) 09/08/2011   Weakness 02/22/2011   Depression 02/22/2011   MENIERE'S DISEASE 04/30/2009   Vitamin D deficiency 02/05/2009   Venous (peripheral)  insufficiency 12/12/2007   RENAL CALCULUS 12/12/2007   DIZZINESS 12/12/2007   HYPERCHOLESTEROLEMIA 09/16/2007   Anxiety state 09/16/2007   HYPERTENSION, BENIGN 09/16/2007   Allergic rhinitis 09/16/2007   IRRITABLE BOWEL SYNDROME 09/16/2007   Myalgia and myositis 09/16/2007    PCP: Renaye Rakers, MD   REFERRING PROVIDER: Jacklynn Ganong, FNP  REFERRING DIAG: [phone] (ICD-10-CM) - Physical deconditioning  THERAPY DIAG:  Muscle weakness (generalized)  Decreased activity tolerance  Difficulty in walking, not elsewhere classified  Abnormal posture  Cramp and spasm  Rationale for Evaluation and Treatment: Rehabilitation  ONSET DATE: 08/23/2023  SUBJECTIVE:   SUBJECTIVE STATEMENT: Patient reports she is doing well with exception of her right knee.  She was having this pain prior to her cardiac event.    PERTINENT HISTORY: na PAIN:  Are you having pain? No  PRECAUTIONS: Other: RECENT PE, MONITOR O2 AND HR!  MD has requested BP monitoring as patient was low at her last 2 visits:  09/28/23: BP 150/89 HR: 69  RED FLAGS: None   WEIGHT BEARING RESTRICTIONS: No  FALLS:  Has patient fallen in last 6 months? No  LIVING ENVIRONMENT: Lives with: lives alone Lives in: House/apartment Stairs: Yes: Internal: 12 steps; on right going up and External: 4 steps; on left going up Has following equipment at home: None  OCCUPATION: get details next visit  PLOF: Independent, Independent with basic ADLs, Independent with household mobility without device, Independent with community mobility without device, Independent with homemaking with ambulation, Independent with gait, and Independent with transfers  PATIENT GOALS: To be able to get back to things I enjoy doing: working part time, sorority events, doing everything independently, driving  NEXT MD VISIT: PRN  OBJECTIVE:  Note: Objective measures were completed at Evaluation unless otherwise noted.  DIAGNOSTIC FINDINGS:  na  COGNITION: Overall cognitive status:  patient wasn't talking much and had flat affect, dtr spoke for patient most of the session      SENSATION: WFL   MUSCLE LENGTH: Generally good flexibility  POSTURE: rounded shoulders and forward head  PALPATION: na  LOWER EXTREMITY ROM:  WNL  LOWER EXTREMITY MMT:  Extremely weak all over: inconclusive due to patient effort- she wasn't feeling well Generally 3 to 3+/5 upper and lower extremities   FUNCTIONAL TESTS: Target HR 122,  Max HR 152 5 times sit to stand: 33.91 With use of hands with O2 sat at no lower than 95% and HR at max 114 Timed up and go (TUG): 19.62 with O2 drop to no lower than 97% and HR max at 111   10/19/23: TUG 11.39 seconds    5x sit to stand: 33.68 without UE support   6 minute walk test: 890 feet- 1 seated rest break- 02 99% after  GAIT: Distance walked: 30 feet Assistive device utilized: None Level  of assistance: SBA Comments: slow, low energy   TODAY'S TREATMENT:        DATE: 12/07/23 NuStep: level 3 x 5 minutes Sit to stand with 5 lb kb 2 x 10 Hip matrix : abduction and extension 2 x 10 each LE  Leg Press x 20 with 30 lbs seat 4 Standing lat pull down 25 lbs 2 x 10 Standing tricep press down 2 x 10 with 10 lbs Lateral band walks with blue loop 3 laps at barre  DATE: 11/23/23 NuStep: level 3 x 8 minutes Standing hip extension 2 x 10 each LE Standing hip abduction 2 x 10 each LE Lateral band walks x 3 laps at barre with blue loop Seated clam with blue loop x 20 Seated LAQ with 2.5 lb ankle weights 2 x 10 each LE Seated march with 2.5 lbs x 20 Seated hip ER with 2.5 lbs 2 x 10 each Seated bicep curls with 1 lb 2 x 10 Seated fwd punches with 1 lb 2 x 10 Seated tricep press with red tband x 20 each UE Step ups with right LE 2 x 10, then left LE 2 x 10 (4" step)   DATE: 11/17/23 NuStep: level 3 x 8 minutes Standing hip extension 2 x 10 each LE Standing hip abduction 2 x 10 each LE Lateral band  walks x 3 laps at barre with blue loop Step ups with right LE x 10, then left LE x 10 (4" step) Standing march x 20 Squats holding barre 2 x 10 Seated bicep curls with 2 lb 2 x 10 Seated overhead press (alternating "steam engines) with 2 lb 2 x 10 Seated alternating fwd punches 2 x 10 with 2 lbs Standing gun slingers with 2 lbs x 10 Standing skiers with 2 lbs 2 x 10 Standing curtsy's holding onto barre 2 x 10                                                                                                                        PATIENT EDUCATION:  Education details: initiated HEP and educated on how to determine target heart rate and max heart rate Person educated: Patient and Child(ren) Education method: Programmer, multimedia, Facilities manager, Verbal cues, and Handouts Education comprehension: verbalized understanding, returned demonstration, and verbal cues required  HOME EXERCISE PROGRAM: Access Code: [email or web address] URL: [email or web address] Date: 08/24/2023 Prepared by: Mikey Kirschner  Exercises - Seated Overhead Press  - 1 x daily - 7 x weekly - 2 sets - 10 reps - Seated Bicep Curls Supinated with Dumbbells  - 1 x daily - 7 x weekly - 2 sets - 10 reps - Seated Long Arc Quad  - 1 x daily - 7 x weekly - 2 sets - 10 reps - Seated March  - 1 x daily - 7 x weekly - 1 sets - 20 reps - Sit to Stand with Armchair  - 1 x daily - 7 x weekly - 2 sets - 5 reps  ASSESSMENT:  CLINICAL IMPRESSION: We introduced patient to gym equipment today, Patient was not familiar with stacked weight equipment and needed max verbal cues for technique on all machines.   She was able to complete all tasks today.    She would benefit from continuing skilled PT for upper and lower extremity strengthening along with balance and gait training to restore patient to her prior level of function.    OBJECTIVE IMPAIRMENTS: cardiopulmonary status limiting activity, decreased activity tolerance, decreased balance,  decreased endurance, decreased knowledge of use of DME, decreased mobility, difficulty walking, decreased strength, and impaired UE functional use.   ACTIVITY LIMITATIONS: carrying, lifting, bending, standing, squatting, stairs, transfers, bed mobility, continence, bathing, toileting, dressing, reach over head, and hygiene/grooming  PARTICIPATION LIMITATIONS: meal prep, cleaning, laundry, driving, shopping, community activity, occupation, yard work, and church  PERSONAL FACTORS: Fitness are also affecting patient's functional outcome.   REHAB POTENTIAL: Good  CLINICAL DECISION MAKING: Stable/uncomplicated  EVALUATION COMPLEXITY: Low   GOALS: Goals reviewed with patient? Yes  SHORT TERM GOALS: Target date: 09/22/2023  Patient to be independent with initial HEP Baseline: Goal status: MET 09/20/23  2.  Patient to be able to prepare small meal for herself independently Baseline: able to do this (10/19/23) Goal status: MET  3.  Patient to report increased appetite and improved nutrition Baseline:  Goal status: MET 10/05/23   LONG TERM GOALS: Target date: 12/14/23   Patient to be independent with advanced HEP  Baseline:  Goal status: In progress   2.  Perform 5x sit to stand in < or = to 18 seconds without UE support Baseline: 33.68 without UE support  Goal status: In progress   3.  Patient to show improvement of at least 3 seconds on TUG test Baseline: 11.39 seconds (10/19/23) Goal status: MET  4.  Patient to demonstrate good heel to toe progression and no loss of balance for 300 feet Baseline:  Goal status: MET  5.  Patient to be able to resume driving Baseline: has not done this yet, her family is watching her (10/19/23) Goal status: In progress   6.  Pt will ambulate > or = to 1200 feet in 6 minutes to improve community independence and allow for return to work Baseline: 890 feet  Goal status: In Progress  7. Negotiate steps at home with alternating pattern with  ascending with use of 1 rail  Baseline: step-to with use of 2 rails   Goal: In Progress   PLAN:  PT FREQUENCY: 1-2x/week  PT DURATION: 8 weeks  PLANNED INTERVENTIONS: 97146- PT Re-evaluation, 97110-Therapeutic exercises, 97530- Therapeutic activity, 97112- Neuromuscular re-education, 97535- Self Care, 24401- Manual therapy, [phone]- Gait training, [phone]- Canalith repositioning, [phone]- Aquatic Therapy, Patient/Family education, Balance training, Stair training, Taping, Dry Needling, Joint mobilization, Spinal mobilization, Vestibular training, Visual/preceptual remediation/compensation, DME instructions, Cryotherapy, Moist heat, Therapeutic exercises, Therapeutic activity, Neuromuscular re-education, Gait training, and Self Care  PLAN FOR NEXT SESSION: Continue Nustep,quad rehab focus for right knee, work on balance, strength and endurance progression to improve independence and return to work, work on ascending and descending steps, possible trial of treadmill or UBE.       Victorino Dike B. Damarcus Reggio, PT 12/07/23 9:52 PM [facility] [address] [place], [place] [address] Phone # [phone] Fax [phone]

## 2023-12-08 ENCOUNTER — Ambulatory Visit (HOSPITAL_COMMUNITY)
Admission: RE | Admit: 2023-12-08 | Discharge: 2023-12-08 | Disposition: A | Discharge status: Home / Self Care | Payer: Medicare PPO | Source: Ambulatory Visit | Attending: Cardiology | Admitting: Cardiology

## 2023-12-08 ENCOUNTER — Encounter (HOSPITAL_COMMUNITY): Payer: Self-pay | Admitting: Cardiology

## 2023-12-08 ENCOUNTER — Other Ambulatory Visit (HOSPITAL_COMMUNITY): Payer: Self-pay

## 2023-12-08 VITALS — BP 112/70 | HR 82 | Wt 149.0 lb

## 2023-12-08 DIAGNOSIS — I2510 Atherosclerotic heart disease of native coronary artery without angina pectoris: Secondary | ICD-10-CM

## 2023-12-08 DIAGNOSIS — M3320 Polymyositis, organ involvement unspecified: Secondary | ICD-10-CM | POA: Diagnosis not present

## 2023-12-08 DIAGNOSIS — I252 Old myocardial infarction: Secondary | ICD-10-CM | POA: Insufficient documentation

## 2023-12-08 DIAGNOSIS — J4489 Other specified chronic obstructive pulmonary disease: Secondary | ICD-10-CM | POA: Diagnosis not present

## 2023-12-08 DIAGNOSIS — I2699 Other pulmonary embolism without acute cor pulmonale: Secondary | ICD-10-CM | POA: Diagnosis not present

## 2023-12-08 DIAGNOSIS — I50812 Chronic right heart failure: Secondary | ICD-10-CM | POA: Diagnosis not present

## 2023-12-08 DIAGNOSIS — Z955 Presence of coronary angioplasty implant and graft: Secondary | ICD-10-CM | POA: Insufficient documentation

## 2023-12-08 DIAGNOSIS — Z86711 Personal history of pulmonary embolism: Secondary | ICD-10-CM | POA: Diagnosis not present

## 2023-12-08 DIAGNOSIS — Z7901 Long term (current) use of anticoagulants: Secondary | ICD-10-CM | POA: Diagnosis not present

## 2023-12-08 DIAGNOSIS — R9431 Abnormal electrocardiogram [ECG] [EKG]: Secondary | ICD-10-CM | POA: Diagnosis not present

## 2023-12-08 DIAGNOSIS — I110 Hypertensive heart disease with heart failure: Secondary | ICD-10-CM | POA: Diagnosis not present

## 2023-12-08 LAB — LIPID PANEL
Cholesterol: 217 mg/dL — ABNORMAL HIGH (ref 0–200)
HDL: 27 mg/dL — ABNORMAL LOW (ref >40)
LDL Cholesterol: 141 mg/dL — ABNORMAL HIGH (ref 0–99)
Total CHOL/HDL Ratio: 8 {ratio}
Triglycerides: 245 mg/dL — ABNORMAL HIGH (ref <150)
VLDL: 49 mg/dL — ABNORMAL HIGH (ref 0–40)

## 2023-12-08 LAB — BASIC METABOLIC PANEL
Anion gap: 10 (ref 5–15)
BUN: 27 mg/dL — ABNORMAL HIGH (ref 8–23)
CO2: 16 mmol/L — ABNORMAL LOW (ref 22–32)
Calcium: 9.8 mg/dL (ref 8.9–10.3)
Chloride: 110 mmol/L (ref 98–111)
Creatinine, Ser: 1.39 mg/dL — ABNORMAL HIGH (ref 0.44–1.00)
GFR, Estimated: 41 mL/min — ABNORMAL LOW (ref >60)
Glucose, Bld: 96 mg/dL (ref 70–99)
Potassium: 4.9 mmol/L (ref 3.5–5.1)
Sodium: 136 mmol/L (ref 135–145)

## 2023-12-08 LAB — CBC
HCT: 36.4 % (ref 36.0–46.0)
Hemoglobin: 11.9 g/dL — ABNORMAL LOW (ref 12.0–15.0)
MCH: 32.5 pg (ref 26.0–34.0)
MCHC: 32.7 g/dL (ref 30.0–36.0)
MCV: 99.5 fL (ref 80.0–100.0)
Platelets: 407 10*3/uL — ABNORMAL HIGH (ref 150–400)
RBC: 3.66 MIL/uL — ABNORMAL LOW (ref 3.87–5.11)
RDW: 12.7 % (ref 11.5–15.5)
WBC: 5 10*3/uL (ref 4.0–10.5)
nRBC: 0 % (ref 0.0–0.2)

## 2023-12-08 LAB — BRAIN NATRIURETIC PEPTIDE: B Natriuretic Peptide: 19.2 pg/mL (ref 0.0–100.0)

## 2023-12-08 NOTE — Progress Notes (Signed)
Primary Care: Jocelyn Rakers, MD HF Cardiologist: Dr. Shirlee Latch  HPI: 68 y.o. female w/ h/o inferior STEMI on 06/15/2017. She received a drug-eluting stent to her distal right coronary artery. She had mild LAD disease. Echo at that time showed normal LVEF 50-55%, RV normal.  She additionally has history of polymyositis (followed by Dr. Dierdre Forth), sarcoidosis diagnosed by bronchoscopy in the 1990s (in remission), and asymptomatic meningioma.   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    Admitted 9/24 with PE. CT angio showed saddle pulmonary embolism as well as extensive bilateral emboli. She developed PEA arrest/obstructive shock. Achieved ROSC after ~5 min of CPR, Epi x 1 and TNK. Intubated, started on NE. Initial bedside echo showed LVEF ~30-35%, dilated RV w/ paradoxical septal motion and RV hypokinesis. Underwent aspiration thrombectomy of right and left main and lobar pulmonary arteries by IR. Repeat formal echo showed improved LVEF, 60-65%, + RV McConnell's sign. RV dilated mildly reduced systolic fx. Required milrinone gtt. Drips eventually weaned.  Limited echo prior to discharge showed EF 60-65%, RV mildly dilated with mild HK. Will require lifelong AC. She was discharged home, weight 173 lbs. She was negative for antiphospholipid antibody syndrome, Factor V Leiden, and prothrombin gene mutation.   Repeat echo was done in 12/24, showing EF 60-65%, mild LVH, borderline RV dysfunction with hypokinesis at apex, no TR jet to estimate PA systolic pressure, IVC normal.   She returns for followup of RV failure.  She does not get short of breath walking on flat ground.  She is short of breath walking up stairs. She is fatigued when she goes to the grocery store.  No chest pain.  No palpitations.  No orthopnea/PND.  No lightheadedness.   ECG (personally reviewed): NSR, anterior Qs  Labs (9/24): K 3.7, creatinine 1.13  PMH: 1. Polymyositis: On azathioprine 2. Sarcoidosis:  Diagnosed by bronchoscopy in 1980s.  Now in remission.  3. CAD: Inferior STEMI in 8/18, DES to distal RCA.  - Cardiolite in 9/24 with no ischemia/infarction.  4. Meningioma: 6 mm meningioma noted on CT in 9/24, followed by neurology.  5. H/o Meniere's disease 6. Nephrolithiasis 7. Hyperlipidemia 8. Type 2 diabetes 9. Pulmonary embolus: Large saddle embolus in 9/24, no definite trigger.  Treated with mechanical thrombectomy.  Initial RV failure.  Negative lupus anticoagulant, anticardiolipin antibody, Factor V Leiden, and prothrombin gene mutation.  - Echo (08/04/23): EF 60-65%, mild LVH, RVOT dilated and mildly reduced, + McConnell's sign. - Ltd echo (08/09/23): EF 60-65%, RV mildly dilated with mild HK. - Echo (12/24): EF 60-65%, mild LVH, borderline RV dysfunction with hypokinesis at apex, no TR jet to estimate PA systolic pressure, IVC normal.   Review of Systems: All systems reviewed and negative except as per HPI.   Current Outpatient Medications  Medication Sig Dispense Refill   allopurinol (ZYLOPRIM) 100 MG tablet Take 1 tablet by mouth daily as needed.     ALPRAZolam (XANAX) 0.5 MG tablet Take 0.5 mg by mouth 3 (three) times daily.     apixaban (ELIQUIS) 5 MG TABS tablet Take 10 mg by mouth twice daily, then on 08/17/2023-switch to 5 mg by mouth twice daily. 180 tablet 3   azaTHIOprine (IMURAN) 50 MG tablet Take 1 tablet by mouth daily.     benzonatate (TESSALON) 100 MG capsule Take 1 capsule (100 mg total) by mouth 3 (three) times daily as needed for cough. 30 capsule 1   Empagliflozin-metFORMIN HCl ER 25-1000 MG TB24  Take 1 tablet by mouth daily.     GEMTESA 75 MG TABS Take 1 tablet by mouth daily.     icosapent Ethyl (VASCEPA) 1 g capsule TAKE 2 CAPSULES(2 GRAMS) BY MOUTH TWICE DAILY 360 capsule 3   metoprolol succinate (TOPROL-XL) 100 MG 24 hr tablet Take 1 tablet (100 mg total) by mouth daily. Take with or immediately following a meal. 90 tablet 3   NEXLETOL 180 MG TABS TAKE 1  TABLET BY MOUTH DAILY 90 tablet 1   nitroGLYCERIN (NITROSTAT) 0.4 MG SL tablet Place 1 tablet (0.4 mg total) under the tongue every 5 (five) minutes as needed for chest pain. 25 tablet 2   ONETOUCH VERIO test strip CHECK BLOOD SUGAR TID AS DIRECTED     PRALUENT 150 MG/ML SOAJ ADMINISTER 1 ML UNDER THE SKIN EVERY 14 DAYS 6 mL 3   spironolactone (ALDACTONE) 25 MG tablet TAKE 1 TABLET BY MOUTH EVERY DAY 90 tablet 2   pantoprazole (PROTONIX) 40 MG tablet Take 1 tablet (40 mg total) by mouth daily. (Patient not taking: Reported on 12/08/2023) 30 tablet 0   No current facility-administered medications for this encounter.   Allergies  Allergen Reactions   Actos [Pioglitazone] Other (See Comments)    REACTION: pt states INTOL w/ edema   Codeine Other (See Comments)    REACTION: vomiting   Januvia [Sitagliptin] Nausea Only   Lactose Intolerance (Gi) Diarrhea   Morphine Nausea Only and Nausea And Vomiting    REACTION: vomiting REACTION: vomiting   Repatha [Evolocumab]     WAS NOT EFFECTIVE IN LOWERING LDL   Social History   Socioeconomic History   Marital status: Single    Spouse name: Not on file   Number of children: 1   Years of education: Not on file   Highest education level: Not on file  Occupational History   Occupation: Retired Runner, broadcasting/film/video   Tobacco Use   Smoking status: Never   Smokeless tobacco: Never   Tobacco comments:    Daily Caffeine - 1  Exercise 2-3 times/weekly  Vaping Use   Vaping status: Never Used  Substance and Sexual Activity   Alcohol use: No    Alcohol/week: 0.0 standard drinks of alcohol   Drug use: No   Sexual activity: Yes    Birth control/protection: None  Other Topics Concern   Not on file  Social History Narrative   Exercises 2-3 times weekly   Caffeine Use: 1 daily (tea or Pepsi)   Lives alone in a one story home.  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     Social Drivers of Corporate investment banker Strain: Low Risk  (08/05/2023)   Overall  Financial Resource Strain (CARDIA)    Difficulty of Paying Living Expenses: Not hard at all  Food Insecurity: No Food Insecurity (08/05/2023)   Hunger Vital Sign    Worried About Running Out of Food in the Last Year: Never true    Ran Out of Food in the Last Year: Never true  Transportation Needs: No Transportation Needs (08/05/2023)   PRAPARE - Administrator, Civil Service (Medical): No    Lack of Transportation (Non-Medical): No  Physical Activity: Not on file  Stress: No Stress Concern Present (08/05/2023)   [employer] of Occupational Health - Occupational Stress Questionnaire    Feeling of Stress : Not at all  Social Connections: Not on file  Intimate Partner Violence: Not At Risk (08/05/2023)   Humiliation, Afraid, Rape, and  Kick questionnaire    Fear of Current or Ex-Partner: No    Emotionally Abused: No    Physically Abused: No    Sexually Abused: No   Family History  Adopted: Yes  Problem Relation Age of Onset   Other Other        ADOPTED   Colon cancer Neg Hx    Esophageal cancer Neg Hx    Rectal cancer Neg Hx    Stomach cancer Neg Hx    Wt Readings from Last 3 Encounters:  12/08/23 67.6 kg (149 lb)  11/23/23 68 kg (150 lb)  10/26/23 68 kg (150 lb)   BP 112/70   Pulse 82   Wt 67.6 kg (149 lb)   SpO2 98%   BMI 27.25 kg/m   PHYSICAL EXAM: General: NAD Neck: No JVD, no thyromegaly or thyroid nodule.  Lungs: Mild crackles at bases.  CV: Nondisplaced PMI.  Heart regular S1/S2, no S3/S4, no murmur.  No peripheral edema.  No carotid bruit.  Normal pedal pulses.  Abdomen: Soft, nontender, no hepatosplenomegaly, no distention.  Skin: Intact without lesions or rashes.  Neurologic: Alert and oriented x 3.  Psych: Normal affect. Extremities: No clubbing or cyanosis.  HEENT: Normal.   ASSESSMENT & PLAN: 1. PE: Large, hemodynamically significant saddle embolus in 9/24. Had PEA arrest. Had thrombolytics then mechanical aspiration by IR. Trigger for PE  uncertain, ambulation limited by knee pain but not sedentary.  No long trips, no known cancer, etc.  Does not know family history (adopted). Hypercoagulable work up negative--> Factor V Leiden, prothrombin gene mutation, and lupus anticoagulant panel. - Continue Eliquis indefinitely.  CBC today.  - Has appointment with hematology.  - Refer for pulmonary rehab.  - Check oxygen saturation with ambulation today.  2. Chronic RV failure: Shock in 9/24 from large saddle embolus.  S/p thrombectomy and started on pressors/inotropes for RV shock.  Now seems resolved.  Echo in 12/24 showed EF 60-65%, mild LVH, borderline RV dysfunction with hypokinesis at apex, no TR jet to estimate PA systolic pressure, IVC normal.  - She has not needed Lasix.  - Continue spironolactone 25 mg daily. BMET today.  - Continue Jardiance. - Check BNP.  3. CAD: H/o inferior MI in 8/18 with DES to RCA.  Cardiolite in 9/24 showed no ischemia/infarction. No chest pain. - Continue Praluent, Nexletol, Vascepa.  Check lipids today. - No ASA given Eliquis use.  4. HTN: BP controlled.    Followup in 6 months with APP.   Marca Ancona 12/08/2023

## 2023-12-08 NOTE — Progress Notes (Signed)
Paramedicine Encounter   Patient ID: Jocelyn [name] , female,   DOB: 1956/05/12,68 y.o.,  MRN: [id number]   Met patient in clinic today with provider.  Weight @ clinic-149  B/P-112/70 P-82 [id number]   She has random rash to her left upper chest. Looks like red bumps, no changes to have caused this, no med changes. This just started today.  She is putting hydrocortisone cream on it, I also advised she could use benadryl cream as well.   She still not feel 100% from her cold, but getting better.  She got approval from PCP to go back to work with limitations.   ECHO 12/24---  small part of the rt side has small amount of weakness.  Left side returned to normal.   She had sarcoidosis years ago and is in remission.   No explanation for PE.  Genetic testing was negative.   Continue eliquis for long term treatment.   Labs checked today.   Another referral to pulm rehab.   Fluid level looks good.    Walk test done today-- She may need an overnight 02 study. -will have to come from either lung doc or PCP   Med changes-none so far -will see once labs come back  She is due back in 6 mths.    Kerry Hough, EMT-Paramedic [phone] 12/08/2023

## 2023-12-08 NOTE — Patient Instructions (Signed)
There has been no changes to your medications.  Labs done today, your results will be available in MyChart, we will contact you for abnormal readings.  You have been referred to pulmonary rehab. They will call you to arrange your appointment.  Your physician recommends that you schedule a follow-up appointment in: 6 months.  If you have any questions or concerns before your next appointment please send [name] a message through [place] or call our office at [phone].    TO LEAVE A MESSAGE FOR THE NURSE SELECT OPTION 2, PLEASE LEAVE A MESSAGE INCLUDING: YOUR NAME DATE OF BIRTH CALL BACK NUMBER REASON FOR CALL**this is important as we prioritize the call backs  YOU WILL RECEIVE A CALL BACK THE SAME DAY AS LONG AS YOU CALL BEFORE 4:00 PM  At the [facility], you and your health needs are our priority. As part of our continuing mission to provide you with exceptional heart care, we have created designated Provider Care Teams. These Care Teams include your primary Cardiologist (physician) and Advanced Practice Providers (APPs- Physician Assistants and Nurse Practitioners) who all work together to provide you with the care you need, when you need it.   You may see any of the following providers on your designated Care Team at your next follow up: Dr Arvilla Meres Dr Marca Ancona Dr. Dorthula Nettles Dr. Clearnce Hasten Amy Filbert Schilder, NP Robbie Lis, [place] [facility] [place], [place] Brynda Peon, NP [place] Lee, NP Karle Plumber, PharmD   Please be sure to bring in all your medications bottles to every appointment.    Thank you for choosing [facility]

## 2023-12-14 ENCOUNTER — Ambulatory Visit: Payer: Medicare PPO

## 2023-12-14 ENCOUNTER — Telehealth (HOSPITAL_COMMUNITY): Payer: Self-pay

## 2023-12-14 ENCOUNTER — Other Ambulatory Visit (HOSPITAL_COMMUNITY): Payer: Self-pay

## 2023-12-14 DIAGNOSIS — R262 Difficulty in walking, not elsewhere classified: Secondary | ICD-10-CM | POA: Diagnosis not present

## 2023-12-14 DIAGNOSIS — R252 Cramp and spasm: Secondary | ICD-10-CM | POA: Diagnosis not present

## 2023-12-14 DIAGNOSIS — M6281 Muscle weakness (generalized): Secondary | ICD-10-CM

## 2023-12-14 DIAGNOSIS — R6889 Other general symptoms and signs: Secondary | ICD-10-CM

## 2023-12-14 DIAGNOSIS — E782 Mixed hyperlipidemia: Secondary | ICD-10-CM

## 2023-12-14 DIAGNOSIS — R293 Abnormal posture: Secondary | ICD-10-CM

## 2023-12-14 NOTE — Progress Notes (Signed)
Paramedicine Encounter    Patient ID: Jocelyn [name], female    DOB: 01/18/1956, 68 y.o.   MRN: [id number]   Complaints-none   Edema-none   Compliance with meds-missing some doses   Pill box filled-partial  If so, by whom-pt  I checked pill box and outside the vascepa its in there.   Refills needed-none right now  She was seen in clinic last week. Her cholesterol labs were high, dr Shirlee Latch has put in note about referral to lipid clinic and to see if she has been taking all her meds.  So it turns out she has not been taking her meds consistently.  She has been taking vascepa -only taking in the evening.   Inconsistent with the praluent.   She has been taking the nexletol consistently.   She has not been taking allopurinol daily, she hs some as needed. She said dr bland prescribed it as preventative. She goes back to dr bland and I advised her to ask about that one.   Jocelyn [name]- is $128 for a 2 month supply -she said pharmacy told her that she can't use savings code due to her having medicare. She cannot afford that right now, so she told pharmacy to keep it for a bit.  I wonder if she has a deductible plan.   [facility] did get 90 day supply on early dec. She has 1 month left of that.   She does have grant for the cholesterol meds. Sent message to pharm team to help with that.   Will keep following- I suspect some more meds are going to come up expensive for next refills.    BP 120/78   Pulse 62   Resp 16   Wt 145 lb (65.8 kg)   SpO2 96%   BMI 26.52 kg/m  Weight yesterday-145 Last visit weight-149 @ clinic   Patient Care Team: Renaye Rakers, MD as PCP - General (Family Medicine) Corky Crafts, MD as PCP - Cardiology (Cardiology)  Patient Active Problem List   Diagnosis Date Noted  . Physical deconditioning 08/23/2023  . Acute respiratory failure with hypoxia (HCC) 08/05/2023  . Pulmonary embolism (HCC) 08/03/2023  . Pulmonary edema 08/03/2023   . Degenerative arthritis of knee 08/20/2021  . Hx of pleural effusion   . Dyspnea 08/16/2018  . Fall on or from sidewalk curb, initial encounter 05/31/2018  . History of acute inferior wall MI 08/22/2017  . Diabetes mellitus (HCC)   . S/P right coronary artery (RCA) stent placement   . Overweight 05/05/2017  . Diabetes mellitus type 2, controlled, without complications (HCC) 12/16/2016  . Asthmatic bronchitis , chronic (HCC) 05/10/2016  . Polymyositis (HCC) 09/15/2015  . History of sarcoidosis 09/15/2015  . Falling 03/13/2015  . Edema 02/13/2014  . Coronary artery disease involving native heart without angina pectoris 10/29/2013  . Hyperlipidemia 10/29/2013  . Iron (Fe) deficiency anemia 08/08/2013  . Vitamin B 12 deficiency 08/08/2013  . GERD (gastroesophageal reflux disease) 09/08/2011  . Weakness 02/22/2011  . Depression 02/22/2011  . MENIERE'S DISEASE 04/30/2009  . Vitamin D deficiency 02/05/2009  . Venous (peripheral) insufficiency 12/12/2007  . RENAL CALCULUS 12/12/2007  . DIZZINESS 12/12/2007  . HYPERCHOLESTEROLEMIA 09/16/2007  . Anxiety state 09/16/2007  . HYPERTENSION, BENIGN 09/16/2007  . Allergic rhinitis 09/16/2007  . IRRITABLE BOWEL SYNDROME 09/16/2007  . Myalgia and myositis 09/16/2007    Current Outpatient Medications:  .  allopurinol (ZYLOPRIM) 100 MG tablet, Take 1 tablet by mouth daily as  needed., Disp: , Rfl:  .  ALPRAZolam (XANAX) 0.5 MG tablet, Take 0.5 mg by mouth 3 (three) times daily., Disp: , Rfl:  .  apixaban (ELIQUIS) 5 MG TABS tablet, Take 10 mg by mouth twice daily, then on 08/17/2023-switch to 5 mg by mouth twice daily., Disp: 180 tablet, Rfl: 3 .  azaTHIOprine (IMURAN) 50 MG tablet, Take 1 tablet by mouth daily., Disp: , Rfl:  .  Empagliflozin-metFORMIN HCl ER 25-1000 MG TB24, Take 1 tablet by mouth daily., Disp: , Rfl:  .  GEMTESA 75 MG TABS, Take 1 tablet by mouth daily., Disp: , Rfl:  .  icosapent Ethyl (VASCEPA) 1 g capsule, TAKE 2  CAPSULES(2 GRAMS) BY MOUTH TWICE DAILY, Disp: 360 capsule, Rfl: 3 .  metoprolol succinate (TOPROL-XL) 100 MG 24 hr tablet, Take 1 tablet (100 mg total) by mouth daily. Take with or immediately following a meal., Disp: 90 tablet, Rfl: 3 .  NEXLETOL 180 MG TABS, TAKE 1 TABLET BY MOUTH DAILY, Disp: 90 tablet, Rfl: 1 .  PRALUENT 150 MG/ML SOAJ, ADMINISTER 1 ML UNDER THE SKIN EVERY 14 DAYS, Disp: 6 mL, Rfl: 3 .  spironolactone (ALDACTONE) 25 MG tablet, TAKE 1 TABLET BY MOUTH EVERY DAY, Disp: 90 tablet, Rfl: 2 .  benzonatate (TESSALON) 100 MG capsule, Take 1 capsule (100 mg total) by mouth 3 (three) times daily as needed for cough., Disp: 30 capsule, Rfl: 1 .  nitroGLYCERIN (NITROSTAT) 0.4 MG SL tablet, Place 1 tablet (0.4 mg total) under the tongue every 5 (five) minutes as needed for chest pain., Disp: 25 tablet, Rfl: 2 .  ONETOUCH VERIO test strip, CHECK BLOOD SUGAR TID AS DIRECTED, Disp: , Rfl:  .  pantoprazole (PROTONIX) 40 MG tablet, Take 1 tablet (40 mg total) by mouth daily. (Patient not taking: Reported on 12/14/2023), Disp: 30 tablet, Rfl: 0 Allergies  Allergen Reactions  . Actos [Pioglitazone] Other (See Comments)    REACTION: pt states INTOL w/ edema  . Codeine Other (See Comments)    REACTION: vomiting  . Januvia [Sitagliptin] Nausea Only  . Lactose Intolerance (Gi) Diarrhea  . Morphine Nausea Only and Nausea And Vomiting    REACTION: vomiting REACTION: vomiting  . Repatha [Evolocumab]     WAS NOT EFFECTIVE IN LOWERING LDL      Social History   Socioeconomic History  . Marital status: Single    Spouse name: Not on file  . Number of children: 1  . Years of education: Not on file  . Highest education level: Not on file  Occupational History  . Occupation: Retired Runner, broadcasting/film/video   Tobacco Use  . Smoking status: Never  . Smokeless tobacco: Never  . Tobacco comments:    Daily Caffeine - 1  Exercise 2-3 times/weekly  Vaping Use  . Vaping status: Never Used  Substance and Sexual  Activity  . Alcohol use: No    Alcohol/week: 0.0 standard drinks of alcohol  . Drug use: No  . Sexual activity: Yes    Birth control/protection: None  Other Topics Concern  . Not on file  Social History Narrative   Exercises 2-3 times weekly   Caffeine Use: 1 daily (tea or Pepsi)   Lives alone in a one story home.  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     Social Drivers of Corporate investment banker Strain: Low Risk  (08/05/2023)   Overall Financial Resource Strain (CARDIA)   . Difficulty of Paying Living Expenses: Not hard at all  Food  Insecurity: No Food Insecurity (08/05/2023)   Hunger Vital Sign   . Worried About Programme researcher, broadcasting/film/video in the Last Year: Never true   . Ran Out of Food in the Last Year: Never true  Transportation Needs: No Transportation Needs (08/05/2023)   PRAPARE - Transportation   . Lack of Transportation (Medical): No   . Lack of Transportation (Non-Medical): No  Physical Activity: Not on file  Stress: No Stress Concern Present (08/05/2023)   [employer] of Occupational Health - Occupational Stress Questionnaire   . Feeling of Stress : Not at all  Social Connections: Not on file  Intimate Partner Violence: Not At Risk (08/05/2023)   Humiliation, Afraid, Rape, and Kick questionnaire   . Fear of Current or Ex-Partner: No   . Emotionally Abused: No   . Physically Abused: No   . Sexually Abused: No    Physical Exam      Future Appointments  Date Time Provider Department Center  12/14/2023  3:30 PM Vernell Barrier, PT [facility] None  01/30/2024  3:00 PM Kathleene Hazel, MD [facility]  06/06/2024  2:00 PM [facility] PA/NP [facility] None  07/30/2024  1:45 PM Windell Norfolk, MD [facility] None       Kerry Hough, Paramedic [phone] [facility] Paramedic  12/14/23

## 2023-12-14 NOTE — Telephone Encounter (Signed)
Spoke with patient regarding the following results. Patient made aware and patient verbalized understanding.   Patient had gotten off schedule for a while but states she has been back on it. She would like to see lipid clinic- referral placed.   Advised patient to call back to office with any issues, questions, or concerns. Patient verbalized understanding.

## 2023-12-14 NOTE — Telephone Encounter (Signed)
-----   Message from Marca Ancona sent at 12/09/2023 12:13 PM EST ----- Is she taking Nexletol, Praluent, and Vascepa? LDL is surprisingly high.  Needs to get in with lipid clinic.

## 2023-12-14 NOTE — Therapy (Unsigned)
OUTPATIENT PHYSICAL THERAPY LOWER EXTREMITY TREATMENT PHYSICAL THERAPY DISCHARGE SUMMARY  Visits from Start of Care: 10  Current functional level related to goals / functional outcomes: See below   Remaining deficits: See below   Education / Equipment: See below   Patient agrees to discharge. Patient goals were met. Patient is being discharged due to meeting the stated rehab goals.    Patient Name: Jocelyn [name] MRN: [id number] DOB:06/28/1956, 68 y.o., female Today's Date: 12/15/2023  END OF SESSION:  PT End of Session - 12/14/23 1543     Visit Number 10    Number of Visits 14    Date for PT Re-Evaluation 12/14/23    Authorization Type [employer] approved 8 additional visits until 12/14/23    Authorization - Visit Number 10    Authorization - Number of Visits 14    Progress Note Due on Visit 10    PT Start Time 0300    PT Stop Time 0338    PT Time Calculation (min) 38 min    Activity Tolerance Patient tolerated treatment well    Behavior During Therapy [facility] for tasks assessed/performed              Past Medical History:  Diagnosis Date   Acute bronchitis    Allergic rhinitis, cause unspecified    Anemia    Anxiety    B12 deficiency    BV (bacterial vaginosis) 06/22/1996   Calculus of kidney    Calculus of kidney    Coronary atherosclerosis of unspecified type of vessel, native or graft    Dizziness    Essential hypertension, benign    Fibroid 2003   Fibromyalgia    H/O dysmenorrhea 2008   H/O varicella    Headache(784.0)    frequently   HSV-2 infection 2009   Hyperplastic colon polyp 05/16/2014   Irritable bowel syndrome    Meniere's disease, unspecified    Menses, irregular 2003   Myalgia and myositis, unspecified    Myocardial infarction (HCC)    Obstructive sleep apnea (adult) (pediatric)    Perimenopausal symptoms 2003   Pure hypercholesterolemia    Sarcoidosis    Type II or unspecified type diabetes mellitus without mention of  complication, not stated as uncontrolled    Unspecified venous (peripheral) insufficiency    Vitamin D deficiency    Vulvitis 2010   Yeast infection    Past Surgical History:  Procedure Laterality Date   COLONOSCOPY     CORONARY STENT INTERVENTION N/A 06/15/2017   Procedure: CORONARY STENT INTERVENTION;  Surgeon: Corky Crafts, MD;  [facility] IN[facility];  Service: Cardiovascular;  Laterality: N/A;   heart catherization     IR ANGIOGRAM PULMONARY BILATERAL SELECTIVE  08/04/2023   IR ANGIOGRAM SELECTIVE EACH ADDITIONAL VESSEL  08/04/2023   IR ANGIOGRAM SELECTIVE EACH ADDITIONAL VESSEL  08/04/2023   IR THROMBECT PRIM MECH INIT (INCLU) MOD SED  08/04/2023   IR US GUIDE VASC ACCESS RIGHT  08/04/2023   LEFT HEART CATH AND CORONARY ANGIOGRAPHY N/A 06/15/2017   Procedure: LEFT HEART CATH AND CORONARY ANGIOGRAPHY;  Surgeon: Corky Crafts, MD;  Location: [facility] IN[facility];  Service: Cardiovascular;  Laterality: N/A;   RADIOLOGY WITH ANESTHESIA N/A 08/04/2023   Procedure: IR WITH ANESTHESIA;  Surgeon: Julieanne Cotton, MD;  Location: [facility] OR;  Service: Radiology;  Laterality: N/A;   TONSILLECTOMY     Patient Active Problem List   Diagnosis Date Noted   Physical deconditioning 08/23/2023   Acute  respiratory failure with hypoxia (HCC) 08/05/2023   Pulmonary embolism (HCC) 08/03/2023   Pulmonary edema 08/03/2023   Degenerative arthritis of knee 08/20/2021   Hx of pleural effusion    Dyspnea 08/16/2018   Fall on or from sidewalk curb, initial encounter 05/31/2018   History of acute inferior wall MI 08/22/2017   Diabetes mellitus (HCC)    S/P right coronary artery (RCA) stent placement    Overweight 05/05/2017   Diabetes mellitus type 2, controlled, without complications (HCC) 12/16/2016   Asthmatic bronchitis , chronic (HCC) 05/10/2016   Polymyositis (HCC) 09/15/2015   History of sarcoidosis 09/15/2015   Falling 03/13/2015   Edema 02/13/2014   Coronary artery disease  involving native heart without angina pectoris 10/29/2013   Hyperlipidemia 10/29/2013   Iron (Fe) deficiency anemia 08/08/2013   Vitamin B 12 deficiency 08/08/2013   GERD (gastroesophageal reflux disease) 09/08/2011   Weakness 02/22/2011   Depression 02/22/2011   MENIERE'S DISEASE 04/30/2009   Vitamin D deficiency 02/05/2009   Venous (peripheral) insufficiency 12/12/2007   RENAL CALCULUS 12/12/2007   DIZZINESS 12/12/2007   HYPERCHOLESTEROLEMIA 09/16/2007   Anxiety state 09/16/2007   HYPERTENSION, BENIGN 09/16/2007   Allergic rhinitis 09/16/2007   IRRITABLE BOWEL SYNDROME 09/16/2007   Myalgia and myositis 09/16/2007    PCP: Renaye Rakers, MD   REFERRING PROVIDER: Jacklynn Ganong, FNP  REFERRING DIAG: R53.81 (ICD-10-CM) - Physical deconditioning  THERAPY DIAG:  Muscle weakness (generalized)  Cramp and spasm  Decreased activity tolerance  Difficulty in walking, not elsewhere classified  Abnormal posture  Rationale for Evaluation and Treatment: Rehabilitation  ONSET DATE: 08/23/2023  SUBJECTIVE:   SUBJECTIVE STATEMENT: Patient reports she is doing well with exception of her right knee.  She was having this pain prior to her cardiac event.    PERTINENT HISTORY: na PAIN:  Are you having pain? No  PRECAUTIONS: Other: RECENT PE, MONITOR O2 AND HR!  MD has requested BP monitoring as patient was low at her last 2 visits:  09/28/23: BP 150/89 HR: 69  RED FLAGS: None   WEIGHT BEARING RESTRICTIONS: No  FALLS:  Has patient fallen in last 6 months? No  LIVING ENVIRONMENT: Lives with: lives alone Lives in: House/apartment Stairs: Yes: Internal: 12 steps; on right going up and External: 4 steps; on left going up Has following equipment at home: None  OCCUPATION: get details next visit  PLOF: Independent, Independent with basic ADLs, Independent with household mobility without device, Independent with community mobility without device, Independent with  homemaking with ambulation, Independent with gait, and Independent with transfers  PATIENT GOALS: To be able to get back to things I enjoy doing: working part time, sorority events, doing everything independently, driving  NEXT MD VISIT: PRN  OBJECTIVE:  Note: Objective measures were completed at Evaluation unless otherwise noted.  DIAGNOSTIC FINDINGS: na  COGNITION: Overall cognitive status:  patient wasn't talking much and had flat affect, dtr spoke for patient most of the session      SENSATION: WFL   MUSCLE LENGTH: Generally good flexibility  POSTURE: rounded shoulders and forward head  PALPATION: na  LOWER EXTREMITY ROM:  WNL  LOWER EXTREMITY MMT:  Extremely weak all over: inconclusive due to patient effort- she wasn't feeling well Generally 3 to 3+/5 upper and lower extremities  12/14/23: Patient with significant improvement.  Generally 4- to 4/5 bilateral UEs      4- to 4/5 bilateral LE's    FUNCTIONAL TESTS: Target HR 122,  Max HR 152 5 times sit  to stand: 33.91 With use of hands with O2 sat at no lower than 95% and HR at max 114 Timed up and go (TUG): 19.62 with O2 drop to no lower than 97% and HR max at 111   10/19/23: TUG 11.39 seconds    5x sit to stand: 33.68 without UE support   6 minute walk test: 890 feet- 1 seated rest break- 02 99% after     12/14/23: TUG 9.40 seconds    5x sit to stand: 22.44 without UE support   6 minute walk test: 964.5 feet- no rest break GAIT: Distance walked: 30 feet Assistive device utilized: None Level of assistance: SBA Comments: slow, low energy   TODAY'S TREATMENT:        DATE: 12/14/23 NuStep: level 3 x 5 minutes DC assessment completed Reviewed all of HEP, how to progress, next steps, how to contact MD if she feels she needs to return Discussed need to avoid being sedentary.  Encouraged her to get out and do activities outside the home.    DATE: 12/07/23 NuStep: level 3 x 5 minutes Sit to stand with 5 lb kb 2  x 10 Hip matrix : abduction and extension 2 x 10 each LE  Leg Press x 20 with 30 lbs seat 4 Standing lat pull down 25 lbs 2 x 10 Standing tricep press down 2 x 10 with 10 lbs Lateral band walks with blue loop 3 laps at barre  DATE: 11/23/23 NuStep: level 3 x 8 minutes Standing hip extension 2 x 10 each LE Standing hip abduction 2 x 10 each LE Lateral band walks x 3 laps at barre with blue loop Seated clam with blue loop x 20 Seated LAQ with 2.5 lb ankle weights 2 x 10 each LE Seated march with 2.5 lbs x 20 Seated hip ER with 2.5 lbs 2 x 10 each Seated bicep curls with 1 lb 2 x 10 Seated fwd punches with 1 lb 2 x 10 Seated tricep press with red tband x 20 each UE Step ups with right LE 2 x 10, then left LE 2 x 10 (4" step)                                      PATIENT EDUCATION:  Education details: initiated HEP and educated on how to determine target heart rate and max heart rate Person educated: Patient and Child(ren) Education method: Programmer, multimedia, Facilities manager, Verbal cues, and Handouts Education comprehension: verbalized understanding, returned demonstration, and verbal cues required  HOME EXERCISE PROGRAM: Access Code: [email or web address] URL: [email or web address] Date: 08/24/2023 Prepared by: Mikey Kirschner  Exercises - Seated Overhead Press  - 1 x daily - 7 x weekly - 2 sets - 10 reps - Seated Bicep Curls Supinated with Dumbbells  - 1 x daily - 7 x weekly - 2 sets - 10 reps - Seated Long Arc Quad  - 1 x daily - 7 x weekly - 2 sets - 10 reps - Seated March  - 1 x daily - 7 x weekly - 1 sets - 20 reps - Sit to Stand with Armchair  - 1 x daily - 7 x weekly - 2 sets - 5 reps  ASSESSMENT:  CLINICAL IMPRESSION: Maansi has met all goals.  Her daughter was able to return home to [place] and she is caring for herself, driving and doing all ADL's and IADL's independently.  Her functional  scores and objective findings are all much improved.  She will likely be starting  pulmonary rehab soon.  She is concerned that she may still need to work on her strength and balance after this.  She was advised to talk with her referring provider for PT if she feels she needs to return to formal PT after pulmonary rehab.  We will DC at this time.    OBJECTIVE IMPAIRMENTS: cardiopulmonary status limiting activity, decreased activity tolerance, decreased balance, decreased endurance, decreased knowledge of use of DME, decreased mobility, difficulty walking, decreased strength, and impaired UE functional use.   ACTIVITY LIMITATIONS: carrying, lifting, bending, standing, squatting, stairs, transfers, bed mobility, continence, bathing, toileting, dressing, reach over head, and hygiene/grooming  PARTICIPATION LIMITATIONS: meal prep, cleaning, laundry, driving, shopping, community activity, occupation, yard work, and church  PERSONAL FACTORS: Fitness are also affecting patient's functional outcome.   REHAB POTENTIAL: Good  CLINICAL DECISION MAKING: Stable/uncomplicated  EVALUATION COMPLEXITY: Low   GOALS: Goals reviewed with patient? Yes  SHORT TERM GOALS: Target date: 09/22/2023  Patient to be independent with initial HEP Baseline: Goal status: MET 09/20/23  2.  Patient to be able to prepare small meal for herself independently Baseline: able to do this (10/19/23) Goal status: MET  3.  Patient to report increased appetite and improved nutrition Baseline:  Goal status: MET 10/05/23   LONG TERM GOALS: Target date: 12/14/23   Patient to be independent with advanced HEP  Baseline:  Goal status: MET   2.  Perform 5x sit to stand in < or = to 18 seconds without UE support Baseline: 33.68 without UE support  Goal status: MET 12/14/23   3.  Patient to show improvement of at least 3 seconds on TUG test Baseline: 11.39 seconds (10/19/23) Goal status: MET  4.  Patient to demonstrate good heel to toe progression and no loss of balance for 300 feet Baseline:  Goal  status: MET  5.  Patient to be able to resume driving Baseline: has not done this yet, her family is watching her (10/19/23) Goal status: MET 12/14/23  6.  Pt will ambulate > or = to 1200 feet in 6 minutes to improve community independence and allow for return to work Baseline:960 feet  Goal status: Goal modified to 950 feet, MET 12/14/23  7. Negotiate steps at home with alternating pattern with ascending with use of 1 rail  Baseline: step-to with use of 2 rails   Goal: MET 12/14/23   PLAN:  PT FREQUENCY: 1-2x/week  PT DURATION: 8 weeks  PLANNED INTERVENTIONS: 97146- PT Re-evaluation, 97110-Therapeutic exercises, 97530- Therapeutic activity, 97112- Neuromuscular re-education, 97535- Self Care, 16109- Manual therapy, [phone]- Gait training, [phone]- Canalith repositioning, [phone]- Aquatic Therapy, Patient/Family education, Balance training, Stair training, Taping, Dry Needling, Joint mobilization, Spinal mobilization, Vestibular training, Visual/preceptual remediation/compensation, DME instructions, Cryotherapy, Moist heat, Therapeutic exercises, Therapeutic activity, Neuromuscular re-education, Gait training, and Self Care  PLAN FOR NEXT SESSION: DC  Ulah Olmo B. Tramya Schoenfelder, PT 12/15/23 9:07 AM [facility] [address], [address] [place], [place] [address] Phone # [phone] Fax [phone]

## 2023-12-15 ENCOUNTER — Telehealth: Payer: Self-pay | Admitting: Primary Care

## 2023-12-15 NOTE — Telephone Encounter (Signed)
[employer] calling, Misty Stanley.She states she needs new SWO and walk test results signed by Ms. Clent Ridges in order to fill the 02 order.  We did provide a walk test before but it was not signed, and "no notations in the chart notes". The new  order that we send would have to provide the date of walk test or later. She told me to send this back STAT.    Fax is [phone] Call Back [phone]

## 2023-12-16 ENCOUNTER — Telehealth (HOSPITAL_COMMUNITY): Payer: Self-pay

## 2023-12-16 ENCOUNTER — Telehealth (HOSPITAL_COMMUNITY): Payer: Self-pay | Admitting: Pharmacy Technician

## 2023-12-16 ENCOUNTER — Other Ambulatory Visit (HOSPITAL_COMMUNITY): Payer: Self-pay

## 2023-12-16 ENCOUNTER — Encounter (HOSPITAL_COMMUNITY): Payer: Self-pay

## 2023-12-16 DIAGNOSIS — E1151 Type 2 diabetes mellitus with diabetic peripheral angiopathy without gangrene: Secondary | ICD-10-CM | POA: Diagnosis not present

## 2023-12-16 DIAGNOSIS — D3502 Benign neoplasm of left adrenal gland: Secondary | ICD-10-CM | POA: Diagnosis not present

## 2023-12-16 DIAGNOSIS — E669 Obesity, unspecified: Secondary | ICD-10-CM | POA: Diagnosis not present

## 2023-12-16 DIAGNOSIS — I2510 Atherosclerotic heart disease of native coronary artery without angina pectoris: Secondary | ICD-10-CM | POA: Diagnosis not present

## 2023-12-16 NOTE — Telephone Encounter (Signed)
Called and spoke with Jocelyn [name], she states she needs the form that was faxed to [name] [name]) signed by [name] NP as well as the office visit note.  Advised that [name] NP is not in the office today and will return on Monday and we will address at that time.  She verbalized understanding.  Routing to [employer] as she is working with [employer].

## 2023-12-16 NOTE — Telephone Encounter (Signed)
Called patient to see if Jocelyn [name] was interested in participating in the Pulmonary Rehab Program. Patient stated yes. Patient will come in for orientation on 01/02/24 and will attend the 1:15 exercise class.  Sent package via [employer].

## 2023-12-16 NOTE — Telephone Encounter (Signed)
Advanced Heart Failure Patient Advocate Encounter  The patient was approved for a [employer] grant that will help cover the cost of Vascepa, Praluent and Nexletol . Total amount awarded, $10,000. Eligibility, 11/18/23 - 11/16/24.  ID [id number]  BIN [id number]  PCN [id number]  Group [id number]  Archer Asa, CPhT

## 2023-12-19 ENCOUNTER — Telehealth (HOSPITAL_COMMUNITY): Payer: Self-pay

## 2023-12-19 NOTE — Telephone Encounter (Signed)
Pt insurance is active and benefits verified through [facility]. Co-pay $15.00, DED $0.00/$0.00 met, out of pocket $4,000.00/40.00 met, co-insurance 0%. No pre-authorization required. Chris/[employer], 12/19/23 @ 2:42PM, [phone]

## 2023-12-20 NOTE — Telephone Encounter (Signed)
ATC Lisa ([employer]) x1. Lmtcb

## 2023-12-21 ENCOUNTER — Telehealth (HOSPITAL_COMMUNITY): Payer: Self-pay

## 2023-12-21 NOTE — Telephone Encounter (Signed)
 Pt needs to resch our appointment today due to conflicting appointment at 12 and wont be back by 1.   Will see her next week.   Alfonza Angry, EMT-Paramedic  [phone] 12/21/2023

## 2023-12-22 NOTE — Telephone Encounter (Signed)
 Jocelyn [name] states she spoke with [name] last. Jocelyn [name] needs the walk test, lov, O2 order. Jocelyn [name] is also faxing over a form, which I gave her B pod's fax number. Jocelyn [name] states it is a Rental form. I advised Jocelyn [name] I would look for form and fax over all information.

## 2023-12-22 NOTE — Telephone Encounter (Signed)
 This is placed in the fax folder located in B pod. Papers have bee signed off by Irby Mannan, NP. NFN

## 2023-12-28 ENCOUNTER — Other Ambulatory Visit (HOSPITAL_COMMUNITY): Payer: Self-pay

## 2023-12-28 NOTE — Progress Notes (Signed)
Paramedicine Encounter    Patient ID: Jocelyn [name], female    DOB: 11/18/1955, 68 y.o.   MRN: [id number]   Complaints-none   Edema-none   Compliance with meds-yes much better   Pill box filled-pt had already filled it  If so, by whom-pt   Refills needed-none at this time   Pt reports she is doing ok. She is back at work one a day a week. Feels ok, but has to go home to rest afterwards.  She is staying between her house and her friends house.     Pt reports her breathing is doing ok. No increased sob.  No dizziness, no c/p.  Weight is stable.  Appetite.  No bleeding issues.  She did get the gemtesa for $128 for 60 day supply.  She did get approved for grant to cover her cholesterol meds.  She did get approved for pulm rehab and she will start on 2/17 for the assessment.  She seen Dr. Sharl Ma on 1/31 and everything was good.  Will see her in 2 wks. She has 2 doc appointments next week so she will have f/u next week.  She has improver her med compliance as well.    BP 132/78   Pulse 74   Resp 16   Wt 144 lb (65.3 kg)   SpO2 98%   BMI 26.34 kg/m  Weight yesterday-144 Last visit weight-145   Patient Care Team: Renaye Rakers, MD as PCP - General (Family Medicine) Corky Crafts, MD as PCP - Cardiology (Cardiology)  Patient Active Problem List   Diagnosis Date Noted   Physical deconditioning 08/23/2023   Acute respiratory failure with hypoxia (HCC) 08/05/2023   Pulmonary embolism (HCC) 08/03/2023   Pulmonary edema 08/03/2023   Degenerative arthritis of knee 08/20/2021   Hx of pleural effusion    Dyspnea 08/16/2018   Fall on or from sidewalk curb, initial encounter 05/31/2018   History of acute inferior wall MI 08/22/2017   Diabetes mellitus (HCC)    S/P right coronary artery (RCA) stent placement    Overweight 05/05/2017   Diabetes mellitus type 2, controlled, without complications (HCC) 12/16/2016   Asthmatic bronchitis , chronic (HCC) 05/10/2016    Polymyositis (HCC) 09/15/2015   History of sarcoidosis 09/15/2015   Falling 03/13/2015   Edema 02/13/2014   Coronary artery disease involving native heart without angina pectoris 10/29/2013   Hyperlipidemia 10/29/2013   Iron (Fe) deficiency anemia 08/08/2013   Vitamin B 12 deficiency 08/08/2013   GERD (gastroesophageal reflux disease) 09/08/2011   Weakness 02/22/2011   Depression 02/22/2011   MENIERE'S DISEASE 04/30/2009   Vitamin D deficiency 02/05/2009   Venous (peripheral) insufficiency 12/12/2007   RENAL CALCULUS 12/12/2007   DIZZINESS 12/12/2007   HYPERCHOLESTEROLEMIA 09/16/2007   Anxiety state 09/16/2007   HYPERTENSION, BENIGN 09/16/2007   Allergic rhinitis 09/16/2007   IRRITABLE BOWEL SYNDROME 09/16/2007   Myalgia and myositis 09/16/2007    Current Outpatient Medications:    allopurinol (ZYLOPRIM) 100 MG tablet, Take 1 tablet by mouth daily as needed., Disp: , Rfl:    ALPRAZolam (XANAX) 0.5 MG tablet, Take 0.5 mg by mouth 3 (three) times daily., Disp: , Rfl:    apixaban (ELIQUIS) 5 MG TABS tablet, Take 10 mg by mouth twice daily, then on 08/17/2023-switch to 5 mg by mouth twice daily., Disp: 180 tablet, Rfl: 3   azaTHIOprine (IMURAN) 50 MG tablet, Take 1 tablet by mouth daily., Disp: , Rfl:    Empagliflozin-metFORMIN HCl ER 25-1000 MG TB24, Take  1 tablet by mouth daily., Disp: , Rfl:    GEMTESA 75 MG TABS, Take 1 tablet by mouth daily., Disp: , Rfl:    icosapent Ethyl (VASCEPA) 1 g capsule, TAKE 2 CAPSULES(2 GRAMS) BY MOUTH TWICE DAILY, Disp: 360 capsule, Rfl: 3   metoprolol succinate (TOPROL-XL) 100 MG 24 hr tablet, Take 1 tablet (100 mg total) by mouth daily. Take with or immediately following a meal., Disp: 90 tablet, Rfl: 3   NEXLETOL 180 MG TABS, TAKE 1 TABLET BY MOUTH DAILY, Disp: 90 tablet, Rfl: 1   PRALUENT 150 MG/ML SOAJ, ADMINISTER 1 ML UNDER THE SKIN EVERY 14 DAYS, Disp: 6 mL, Rfl: 3   spironolactone (ALDACTONE) 25 MG tablet, TAKE 1 TABLET BY MOUTH EVERY DAY, Disp:  90 tablet, Rfl: 2   benzonatate (TESSALON) 100 MG capsule, Take 1 capsule (100 mg total) by mouth 3 (three) times daily as needed for cough., Disp: 30 capsule, Rfl: 1   nitroGLYCERIN (NITROSTAT) 0.4 MG SL tablet, Place 1 tablet (0.4 mg total) under the tongue every 5 (five) minutes as needed for chest pain., Disp: 25 tablet, Rfl: 2   ONETOUCH VERIO test strip, CHECK BLOOD SUGAR TID AS DIRECTED, Disp: , Rfl:    pantoprazole (PROTONIX) 40 MG tablet, Take 1 tablet (40 mg total) by mouth daily. (Patient not taking: Reported on 12/14/2023), Disp: 30 tablet, Rfl: 0 Allergies  Allergen Reactions   Actos [Pioglitazone] Other (See Comments)    REACTION: pt states INTOL w/ edema   Codeine Other (See Comments)    REACTION: vomiting   Januvia [Sitagliptin] Nausea Only   Lactose Intolerance (Gi) Diarrhea   Morphine Nausea Only and Nausea And Vomiting    REACTION: vomiting REACTION: vomiting   Repatha [Evolocumab]     WAS NOT EFFECTIVE IN LOWERING LDL      Social History   Socioeconomic History   Marital status: Single    Spouse name: Not on file   Number of children: 1   Years of education: Not on file   Highest education level: Not on file  Occupational History   Occupation: Retired Runner, broadcasting/film/video   Tobacco Use   Smoking status: Never   Smokeless tobacco: Never   Tobacco comments:    Daily Caffeine - 1  Exercise 2-3 times/weekly  Vaping Use   Vaping status: Never Used  Substance and Sexual Activity   Alcohol use: No    Alcohol/week: 0.0 standard drinks of alcohol   Drug use: No   Sexual activity: Yes    Birth control/protection: None  Other Topics Concern   Not on file  Social History Narrative   Exercises 2-3 times weekly   Caffeine Use: 1 daily (tea or Pepsi)   Lives alone in a one story home.  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     Social Drivers of Corporate investment banker Strain: Low Risk  (08/05/2023)   Overall Financial Resource Strain (CARDIA)    Difficulty of  Paying Living Expenses: Not hard at all  Food Insecurity: No Food Insecurity (08/05/2023)   Hunger Vital Sign    Worried About Running Out of Food in the Last Year: Never true    Ran Out of Food in the Last Year: Never true  Transportation Needs: No Transportation Needs (08/05/2023)   PRAPARE - Administrator, Civil Service (Medical): No    Lack of Transportation (Non-Medical): No  Physical Activity: Not on file  Stress: No Stress Concern Present (08/05/2023)  [employer] of Occupational Health - Occupational Stress Questionnaire    Feeling of Stress : Not at all  Social Connections: Not on file  Intimate Partner Violence: Not At Risk (08/05/2023)   Humiliation, Afraid, Rape, and Kick questionnaire    Fear of Current or Ex-Partner: No    Emotionally Abused: No    Physically Abused: No    Sexually Abused: No    Physical Exam      Future Appointments  Date Time Provider Department Center  01/02/2024  1:00 PM [facility] None  01/10/2024  1:15 PM [facility] None  01/12/2024  1:15 PM [facility] None  01/17/2024  1:15 PM [facility]AD [facility] None  01/19/2024  1:15 PM [facility]GR[facility] None  01/24/2024  1:15 PM [facility]GR[facility] None  01/26/2024  1:15 PM [facility] None  01/30/2024  3:00 PM Kathleene Hazel, MD [facility]  01/31/2024  1:15 PM [facility] None  02/02/2024  1:15 PM [facility] None  02/07/2024  1:15 PM [facility] None  02/09/2024  1:15 PM [facility] None  02/10/2024  2:30 PM [facility] PHARMACIST [facility]  02/14/2024  1:15 PM [facility]GRAD [facility] None  02/16/2024  1:15 PM [facility]GRAD [facility] None  02/21/2024  1:15 PM [facility]  REHAB UNDERGRAD [facility] None  02/23/2024  1:15 PM [facility]GRAD [facility] None  02/28/2024  1:15 PM [facility]GR[facility] None  03/01/2024  1:15 PM [facility] None  03/06/2024  1:15 PM [facility] None  03/08/2024  1:15 PM [facility] None  03/13/2024  1:15 PM [facility] None  03/15/2024  1:15 PM [facility] None  03/20/2024  1:15 PM [facility] RE[facility]GRAD [facility] None  03/22/2024  1:15 PM [facility] REH[facility] UND[facility]GRAD [facility] None  03/27/2024  1:15 PM [facility] REH[facility]GRAD [facility] None  03/29/2024  1:15 PM [facility] REHAB UNDERGRAD [facility] None  06/06/2024  2:00 PM [facility] PA/NP [facility] None  07/30/2024  1:45 PM Windell Norfolk, MD [facility] None       Kerry Hough, Paramedic [phone] [facility] Paramedic  12/28/23

## 2024-01-02 ENCOUNTER — Encounter (HOSPITAL_COMMUNITY)
Admission: RE | Admit: 2024-01-02 | Discharge: 2024-01-02 | Disposition: A | Discharge status: Home / Self Care | Payer: Medicare PPO | Source: Ambulatory Visit | Attending: Internal Medicine | Admitting: Internal Medicine

## 2024-01-02 ENCOUNTER — Encounter (HOSPITAL_COMMUNITY): Payer: Self-pay

## 2024-01-02 VITALS — BP 122/64 | HR 60 | Ht 62.0 in | Wt 148.1 lb

## 2024-01-02 DIAGNOSIS — J9611 Chronic respiratory failure with hypoxia: Secondary | ICD-10-CM | POA: Insufficient documentation

## 2024-01-02 DIAGNOSIS — I2699 Other pulmonary embolism without acute cor pulmonale: Secondary | ICD-10-CM | POA: Insufficient documentation

## 2024-01-02 NOTE — Progress Notes (Signed)
Jocelyn [name] 68 y.o. female  Pulmonary Rehab Orientation Note  This patient who was referred to Pulmonary Rehab by Dr. Marchelle Gearing with the diagnosis of Pulmonary Embolism and Chronic Respiratory Failure with Hypoxia arrived today in Cardiac and Pulmonary Rehab. She arrived ambulatory with normal gait. She  does not carry portable oxygen. Per patient, [place] uses oxygen never. Patient's medical history, psychosocial health, and medications reviewed.   Psychosocial assessment reveals patient lives with alone. Jocelyn [name] is currently retired. Patient hobbies include watching tv and reading. Patient reports her stress level is low. Areas of stress/anxiety include health. Patient does not exhibit signs of depression. Signs of depression include hopelessness and fatigue. PHQ2/9 score 0/1. Mykelle shows good  coping skills with positive outlook on life. Offered emotional support and reassurance. Will continue to monitor and evaluate progress toward psychosocial goal(s) of decreased health stress.   Physical assessment as follows:  Well appearing, A&Ox4, NAD Eyes/Ears: stated she needs an eye MD appointment to get updated glasses Lungs: Clear with no wheezes, rales, rhonchi, denies chronic cough, dyspnea on exertion. Jocelyn [name] stated she had oxygen ordered in November 2024 but never received it. She stated she is still working with her MD and DME company Heart: Regular rate rhythm, no murmurs, no rubs, no clicks Gastrointestinal: abdomin soft, + bowel sounds in all 4 quads, denies recent weight gain or loss, endorses normal BMs Genitourinary: WNL, pt denies s/s Extremities:  +2 pulses, grip strength equal, strong, no edema, no cyanosis, no clubbing Integumentary: pt denies any needs Assistive devices: Glucometer at home. Pt checks blood sugars every other day               Jocelyn [name] reports she does take medications as prescribed. Patient states she follows a regular  diet. The patient reports no specific  efforts to gain or lose weight. Pt's weight will be monitored closely.  Demonstration and practice of PLB using pulse oximeter. Jocelyn [name] able to return demonstration satisfactorily. Safety and hand hygiene in the exercise area reviewed with patient. Jocelyn [name] voices understanding of the information reviewed. Department expectations discussed with patient and achievable goals were set. The patient shows enthusiasm about attending the program and we look forward to working with Jocelyn [name]. Jocelyn [name] completed a 6 min walk test today and is scheduled to begin exercise on 01/10/24 at 1315.   1310-1430 Jocelyn [name]

## 2024-01-02 NOTE — Progress Notes (Signed)
Pulmonary Individual Treatment Plan  Patient Details  Name: Jocelyn [name] MRN: [id number] Date of Birth: [birth year] Referring Provider:   Doristine Devoid Pulmonary Rehab Walk Test from 01/02/2024 in [facility]  Referring Provider Ramaswamy       Initial Encounter Date:  Flowsheet Row Pulmonary Rehab Walk Test from 01/02/2024 in [facility]  Date 01/02/24       Visit Diagnosis: Pulmonary embolism, unspecified chronicity, unspecified pulmonary embolism type, unspecified whether acute cor pulmonale present (HCC)  Chronic respiratory failure with hypoxia (HCC)  Patient's Home Medications on Admission:   Current Outpatient Medications:    allopurinol (ZYLOPRIM) 100 MG tablet, Take 1 tablet by mouth daily as needed., Disp: , Rfl:    ALPRAZolam (XANAX) 0.5 MG tablet, Take 0.5 mg by mouth 3 (three) times daily., Disp: , Rfl:    apixaban (ELIQUIS) 5 MG TABS tablet, Take 10 mg by mouth twice daily, then on 08/17/2023-switch to 5 mg by mouth twice daily., Disp: 180 tablet, Rfl: 3   azaTHIOprine (IMURAN) 50 MG tablet, Take 1 tablet by mouth daily., Disp: , Rfl:    benzonatate (TESSALON) 100 MG capsule, Take 1 capsule (100 mg total) by mouth 3 (three) times daily as needed for cough., Disp: 30 capsule, Rfl: 1   Empagliflozin-metFORMIN HCl ER 25-1000 MG TB24, Take 1 tablet by mouth daily., Disp: , Rfl:    GEMTESA 75 MG TABS, Take 1 tablet by mouth daily., Disp: , Rfl:    icosapent Ethyl (VASCEPA) 1 g capsule, TAKE 2 CAPSULES(2 GRAMS) BY MOUTH TWICE DAILY, Disp: 360 capsule, Rfl: 3   metoprolol succinate (TOPROL-XL) 100 MG 24 hr tablet, Take 1 tablet (100 mg total) by mouth daily. Take with or immediately following a meal., Disp: 90 tablet, Rfl: 3   NEXLETOL 180 MG TABS, TAKE 1 TABLET BY MOUTH DAILY, Disp: 90 tablet, Rfl: 1   nitroGLYCERIN (NITROSTAT) 0.4 MG SL tablet, Place 1  tablet (0.4 mg total) under the tongue every 5 (five) minutes as needed for chest pain., Disp: 25 tablet, Rfl: 2   ONETOUCH VERIO test strip, CHECK BLOOD SUGAR TID AS DIRECTED, Disp: , Rfl:    pantoprazole (PROTONIX) 40 MG tablet, Take 1 tablet (40 mg total) by mouth daily., Disp: 30 tablet, Rfl: 0   PRALUENT 150 MG/ML SOAJ, ADMINISTER 1 ML UNDER THE SKIN EVERY 14 DAYS, Disp: 6 mL, Rfl: 3   spironolactone (ALDACTONE) 25 MG tablet, TAKE 1 TABLET BY MOUTH EVERY DAY, Disp: 90 tablet, Rfl: 2  Past Medical History: Past Medical History:  Diagnosis Date   Acute bronchitis    Allergic rhinitis, cause unspecified    Anemia    Anxiety    B12 deficiency    BV (bacterial vaginosis) 06/22/1996   Calculus of kidney    Calculus of kidney    Coronary atherosclerosis of unspecified type of vessel, native or graft    Dizziness    Essential hypertension, benign    Fibroid 2003   Fibromyalgia    H/O dysmenorrhea 2008   H/O varicella    Headache(784.0)    frequently   HSV-2 infection 2009   Hyperplastic colon polyp 05/16/2014   Irritable bowel syndrome    Meniere's disease, unspecified    Menses, irregular 2003   Myalgia and myositis, unspecified    Myocardial infarction (HCC)    Obstructive sleep apnea (adult) (pediatric)    Perimenopausal symptoms  2003   Pure hypercholesterolemia    Sarcoidosis    Type II or unspecified type diabetes mellitus without mention of complication, not stated as uncontrolled    Unspecified venous (peripheral) insufficiency    Vitamin D deficiency    Vulvitis 2010   Yeast infection     Tobacco Use: Social History   Tobacco Use  Smoking Status Never  Smokeless Tobacco Never  Tobacco Comments   Daily Caffeine - 1  Exercise 2-3 times/weekly    Labs: Review Flowsheet  More data exists      Latest Ref Rng & Units 08/04/2023 08/05/2023 08/06/2023 08/07/2023 12/08/2023  Labs for ITP Cardiac and Pulmonary Rehab  Cholestrol 0 - 200 mg/dL - - - - 562   LDL (calc) 0  - 99 mg/dL - - - - 130   HDL-C >86 mg/dL - - - - 27   Trlycerides <150 mg/dL - - - - 578   PH, Arterial 7.35 - 7.45 7.494  7.184  7.105  6.837  7.445  - - -  PCO2 arterial 32 - 48 mmHg 27.7  20.6  28.2  38.6  36.1  - - -  Bicarbonate 20.0 - 28.0 mmol/L 21.2  8.1  8.9  6.5  24.7  - - -  TCO2 22 - 32 mmol/L 22  9  10  8  26   - - -  Acid-base deficit 0.0 - 2.0 mmol/L 1.0  19.0  19.0  26.0  - - - -  O2 Saturation % 66.7  57.8  100  99  99  96  99  69.2  70.6  79.7  -    Details       Multiple values from one day are sorted in reverse-chronological order         Capillary Blood Glucose: Lab Results  Component Value Date   GLUCAP 115 (H) 08/11/2023   GLUCAP 102 (H) 08/10/2023   GLUCAP 110 (H) 08/10/2023   GLUCAP 158 (H) 08/10/2023   GLUCAP 105 (H) 08/10/2023     Pulmonary Assessment Scores:  Pulmonary Assessment Scores     Row Name 01/02/24 1423         ADL UCSD   ADL Phase Entry     SOB Score total 39       CAT Score   CAT Score 16       mMRC Score   mMRC Score 3             UCSD: Self-administered rating of dyspnea associated with activities of daily living (ADLs) 6-point scale (0 = "not at all" to 5 = "maximal or unable to do because of breathlessness")  Scoring Scores range from 0 to 120.  Minimally important difference is 5 units  CAT: CAT can identify the health impairment of COPD patients and is better correlated with disease progression.  CAT has a scoring range of zero to 40. The CAT score is classified into four groups of low (less than 10), medium (10 - 20), high (21-30) and very high (31-40) based on the impact level of disease on health status. A CAT score over 10 suggests significant symptoms.  A worsening CAT score could be explained by an exacerbation, poor medication adherence, poor inhaler technique, or progression of COPD or comorbid conditions.  CAT MCID is 2 points  mMRC: mMRC (Modified Medical Research Council) Dyspnea Scale is used to  assess the degree of baseline functional disability in patients of respiratory disease due to  dyspnea. No minimal important difference is established. A decrease in score of 1 point or greater is considered a positive change.   Pulmonary Function Assessment:  Pulmonary Function Assessment - 01/02/24 1338       Breath   Bilateral Breath Sounds Clear    Shortness of Breath Yes;Fear of Shortness of Breath;Limiting activity             Exercise Target Goals: Exercise Program Goal: Individual exercise prescription set using results from initial 6 min walk test and THRR while considering  patient's activity barriers and safety.   Exercise Prescription Goal: Initial exercise prescription builds to 30-45 minutes a day of aerobic activity, 2-3 days per week.  Home exercise guidelines will be given to patient during program as part of exercise prescription that the participant will acknowledge.  Activity Barriers & Risk Stratification:  Activity Barriers & Cardiac Risk Stratification - 01/02/24 1339       Activity Barriers & Cardiac Risk Stratification   Activity Barriers Joint Problems;History of Falls;Deconditioning;Muscular Weakness;Shortness of Breath             6 Minute Walk:  6 Minute Walk     Row Name 01/02/24 1507         6 Minute Walk   Phase Initial     Distance 1060 feet     Walk Time 6 minutes     # of Rest Breaks 2  brief rest 1:57-2:10, 4:42-4:50     MPH 2.01     METS 2.39     RPE 12     Perceived Dyspnea  1     VO2 Peak 8.35     Symptoms Yes (comment)     Comments fatigue     Resting HR 60 bpm     Resting BP 122/64     Resting Oxygen Saturation  100 %     Exercise Oxygen Saturation  during 6 min walk 100 %     Max Ex. HR 89 bpm     Max Ex. BP 136/60     2 Minute Post BP 150/70       Interval HR   1 Minute HR 83     2 Minute HR 89     3 Minute HR 84     4 Minute HR 89     5 Minute HR 84     6 Minute HR 86     2 Minute Post HR 59      Interval Heart Rate? Yes       Interval Oxygen   Interval Oxygen? Yes     Baseline Oxygen Saturation % 100 %     1 Minute Oxygen Saturation % 100 %     1 Minute Liters of Oxygen 0 L     2 Minute Oxygen Saturation % 100 %     2 Minute Liters of Oxygen 0 L     3 Minute Oxygen Saturation % 100 %     3 Minute Liters of Oxygen 0 L     4 Minute Oxygen Saturation % 100 %     4 Minute Liters of Oxygen 0 L     5 Minute Oxygen Saturation % 100 %     5 Minute Liters of Oxygen 0 L     6 Minute Oxygen Saturation % 100 %     6 Minute Liters of Oxygen 0 L     2 Minute Post Oxygen Saturation % 100 %  2 Minute Post Liters of Oxygen 0 L              Oxygen Initial Assessment:  Oxygen Initial Assessment - 01/02/24 1337       Home Oxygen   Home Oxygen Device None    Sleep Oxygen Prescription None    Home Exercise Oxygen Prescription None    Home Resting Oxygen Prescription None      Initial 6 min Walk   Oxygen Used None      Program Oxygen Prescription   Program Oxygen Prescription None      Intervention   Short Term Goals To learn and understand importance of maintaining oxygen saturations>88%;To learn and understand importance of monitoring SPO2 with pulse oximeter and demonstrate accurate use of the pulse oximeter.;To learn and demonstrate proper pursed lip breathing techniques or other breathing techniques.     Long  Term Goals Exhibits proper breathing techniques, such as pursed lip breathing or other method taught during program session;Verbalizes importance of monitoring SPO2 with pulse oximeter and return demonstration;Maintenance of O2 saturations>88%             Oxygen Re-Evaluation:   Oxygen Discharge (Final Oxygen Re-Evaluation):   Initial Exercise Prescription:  Initial Exercise Prescription - 01/02/24 1500       Date of Initial Exercise RX and Referring Provider   Date 01/02/24    Referring Provider Ramaswamy    Expected Discharge Date 03/29/24       NuStep   Level 1    SPM 60    Minutes 15    METs 2      Recumbant Elliptical   Level 1    RPM 60    Minutes 15    METs 2      Prescription Details   Frequency (times per week) 2    Duration Progress to 30 minutes of continuous aerobic without signs/symptoms of physical distress      Intensity   THRR 40-80% of Max Heartrate 60-121    Ratings of Perceived Exertion 11-13    Perceived Dyspnea 0-4      Progression   Progression Continue to progress workloads to maintain intensity without signs/symptoms of physical distress.      Resistance Training   Training Prescription Yes    Weight yellow bands    Reps 10-15             Perform Capillary Blood Glucose checks as needed.  Exercise Prescription Changes:   Exercise Comments:   Exercise Goals and Review:   Exercise Goals     Row Name 01/02/24 1339             Exercise Goals   Increase Physical Activity Yes       Intervention Provide advice, education, support and counseling about physical activity/exercise needs.;Develop an individualized exercise prescription for aerobic and resistive training based on initial evaluation findings, risk stratification, comorbidities and participant's personal goals.       Expected Outcomes Short Term: Attend rehab on a regular basis to increase amount of physical activity.;Long Term: Exercising regularly at least 3-5 days a week.;Long Term: Add in home exercise to make exercise part of routine and to increase amount of physical activity.       Increase Strength and Stamina Yes       Intervention Provide advice, education, support and counseling about physical activity/exercise needs.;Develop an individualized exercise prescription for aerobic and resistive training based on initial evaluation findings, risk stratification, comorbidities and participant's  personal goals.       Expected Outcomes Short Term: Increase workloads from initial exercise prescription for resistance, speed,  and METs.;Short Term: Perform resistance training exercises routinely during rehab and add in resistance training at home;Long Term: Improve cardiorespiratory fitness, muscular endurance and strength as measured by increased METs and functional capacity ( )       Able to understand and use rate of perceived exertion (RPE) scale Yes       Intervention Provide education and explanation on how to use RPE scale       Expected Outcomes Short Term: Able to use RPE daily in rehab to express subjective intensity level;Long Term:  Able to use RPE to guide intensity level when exercising independently       Able to understand and use Dyspnea scale Yes       Intervention Provide education and explanation on how to use Dyspnea scale       Expected Outcomes Short Term: Able to use Dyspnea scale daily in rehab to express subjective sense of shortness of breath during exertion;Long Term: Able to use Dyspnea scale to guide intensity level when exercising independently       Knowledge and understanding of Target Heart Rate Range (THRR) Yes       Intervention Provide education and explanation of THRR including how the numbers were predicted and where they are located for reference       Expected Outcomes Short Term: Able to state/look up THRR;Short Term: Able to use daily as guideline for intensity in rehab;Long Term: Able to use THRR to govern intensity when exercising independently       Understanding of Exercise Prescription Yes       Intervention Provide education, explanation, and written materials on patient's individual exercise prescription       Expected Outcomes Short Term: Able to explain program exercise prescription;Long Term: Able to explain home exercise prescription to exercise independently                Exercise Goals Re-Evaluation :   Discharge Exercise Prescription (Final Exercise Prescription Changes):   Nutrition:  Target Goals: Understanding of nutrition guidelines, daily intake  of sodium 1500mg , cholesterol 200mg , calories 30% from fat and 7% or less from saturated fats, daily to have 5 or more servings of fruits and vegetables.  Biometrics:  Pre Biometrics - 01/02/24 1507       Pre Biometrics   Grip Strength 11 kg              Nutrition Therapy Plan and Nutrition Goals:   Nutrition Assessments:  MEDIFICTS Score Key: >=70 Need to make dietary changes  40-70 Heart Healthy Diet <= 40 Therapeutic Level Cholesterol Diet   Picture Your Plate Scores: <78 Unhealthy dietary pattern with much room for improvement. 41-50 Dietary pattern unlikely to meet recommendations for good health and room for improvement. 51-60 More healthful dietary pattern, with some room for improvement.  >60 Healthy dietary pattern, although there may be some specific behaviors that could be improved.    Nutrition Goals Re-Evaluation:   Nutrition Goals Discharge (Final Nutrition Goals Re-Evaluation):   Psychosocial: Target Goals: Acknowledge presence or absence of significant depression and/or stress, maximize coping skills, provide positive support system. Participant is able to verbalize types and ability to use techniques and skills needed for reducing stress and depression.  Initial Review & Psychosocial Screening:  Initial Psych Review & Screening - 01/02/24 1334       Initial Review  Current issues with None Identified      Family Dynamics   Good Support System? Yes      Barriers   Psychosocial barriers to participate in program There are no identifiable barriers or psychosocial needs.      Screening Interventions   Interventions Encouraged to exercise    Expected Outcomes Short Term goal: Utilizing psychosocial counselor, staff and physician to assist with identification of specific Stressors or current issues interfering with healing process. Setting desired goal for each stressor or current issue identified.;Long Term Goal: Stressors or current issues are  controlled or eliminated.;Short Term goal: Identification and review with participant of any Quality of Life or Depression concerns found by scoring the questionnaire.;Long Term goal: The participant improves quality of Life and PHQ9 Scores as seen by post scores and/or verbalization of changes             Quality of Life Scores:  Scores of 19 and below usually indicate a poorer quality of life in these areas.  A difference of  2-3 points is a clinically meaningful difference.  A difference of 2-3 points in the total score of the Quality of Life Index has been associated with significant improvement in overall quality of life, self-image, physical symptoms, and general health in studies assessing change in quality of life.  PHQ-9: Review Flowsheet  More data may exist      01/02/2024 09/03/2020 10/25/2017 07/20/2017 12/03/2014  Depression screen PHQ 2/9  Decreased Interest 0 0 0 0 0  Down, Depressed, Hopeless 0 0 0 0 0  PHQ - 2 Score 0 0 0 0 0  Altered sleeping 1 - - - -  Tired, decreased energy 0 - - - -  Change in appetite 0 - - - -  Feeling bad or failure about yourself  0 - - - -  Trouble concentrating 0 - - - -  Moving slowly or fidgety/restless 0 - - - -  Suicidal thoughts 0 - - - -  PHQ-9 Score 1 - - - -  Difficult doing work/chores Not difficult at all - - - -   Interpretation of Total Score  Total Score Depression Severity:  1-4 = Minimal depression, 5-9 = Mild depression, 10-14 = Moderate depression, 15-19 = Moderately severe depression, 20-27 = Severe depression   Psychosocial Evaluation and Intervention:  Psychosocial Evaluation - 01/02/24 1335       Psychosocial Evaluation & Interventions   Interventions Encouraged to exercise with the program and follow exercise prescription    Comments Shi denies any psychosocial needs at this time    Expected Outcomes Elayne will be free of any psy/soc barriers or concerns and will exhibit positive outlook with good coping  skills.    Continue Psychosocial Services  No Follow up required             Psychosocial Re-Evaluation:   Psychosocial Discharge (Final Psychosocial Re-Evaluation):   Education: Education Goals: Education classes will be provided on a weekly basis, covering required topics. Participant will state understanding/return demonstration of topics presented.  Learning Barriers/Preferences:  Learning Barriers/Preferences - 01/02/24 1529       Learning Barriers/Preferences   Learning Barriers Sight    Learning Preferences Pictoral;Group Instruction;Individual Instruction;Verbal Instruction;Written Material             Education Topics: Know Your Numbers Group instruction that is supported by a PowerPoint presentation. Instructor discusses importance of knowing and understanding resting, exercise, and post-exercise oxygen saturation, heart rate, and blood  pressure. Oxygen saturation, heart rate, blood pressure, rating of perceived exertion, and dyspnea are reviewed along with a normal range for these values.    Exercise for the Pulmonary Patient Group instruction that is supported by a PowerPoint presentation. Instructor discusses benefits of exercise, core components of exercise, frequency, duration, and intensity of an exercise routine, importance of utilizing pulse oximetry during exercise, safety while exercising, and options of places to exercise outside of rehab.    MET Level  Group instruction provided by PowerPoint, verbal discussion, and written material to support subject matter. Instructor reviews what METs are and how to increase METs.    Pulmonary Medications Verbally interactive group education provided by instructor with focus on inhaled medications and proper administration.   Anatomy and Physiology of the Respiratory System Group instruction provided by PowerPoint, verbal discussion, and written material to support subject matter. Instructor reviews  respiratory cycle and anatomical components of the respiratory system and their functions. Instructor also reviews differences in obstructive and restrictive respiratory diseases with examples of each.    Oxygen Safety Group instruction provided by PowerPoint, verbal discussion, and written material to support subject matter. There is an overview of "What is Oxygen" and "Why do we need it".  Instructor also reviews how to create a safe environment for oxygen use, the importance of using oxygen as prescribed, and the risks of noncompliance. There is a brief discussion on traveling with oxygen and resources the patient may utilize.   Oxygen Use Group instruction provided by PowerPoint, verbal discussion, and written material to discuss how supplemental oxygen is prescribed and different types of oxygen supply systems. Resources for more information are provided.    Breathing Techniques Group instruction that is supported by demonstration and informational handouts. Instructor discusses the benefits of pursed lip and diaphragmatic breathing and detailed demonstration on how to perform both.     Risk Factor Reduction Group instruction that is supported by a PowerPoint presentation. Instructor discusses the definition of a risk factor, different risk factors for pulmonary disease, and how the heart and lungs work together.   Pulmonary Diseases Group instruction provided by PowerPoint, verbal discussion, and written material to support subject matter. Instructor gives an overview of the different type of pulmonary diseases. There is also a discussion on risk factors and symptoms as well as ways to manage the diseases.   Stress and Energy Conservation Group instruction provided by PowerPoint, verbal discussion, and written material to support subject matter. Instructor gives an overview of stress and the impact it can have on the body. Instructor also reviews ways to reduce stress. There is also a  discussion on energy conservation and ways to conserve energy throughout the day.   Warning Signs and Symptoms Group instruction provided by PowerPoint, verbal discussion, and written material to support subject matter. Instructor reviews warning signs and symptoms of stroke, heart attack, cold and flu. Instructor also reviews ways to prevent the spread of infection.   Other Education Group or individual verbal, written, or video instructions that support the educational goals of the pulmonary rehab program. Flowsheet Row CARDIAC REHAB MAINTENANCE EXERCISE from 10/28/2017 in [facility]  Date 09/23/17  [ZOXWRUE Eating Survival Tips]  Educator RD  Instruction Review Code 2- Demonstrated Understanding        Knowledge Questionnaire Score:  Knowledge Questionnaire Score - 01/02/24 1425       Knowledge Questionnaire Score   Pre Score 16/18  Core Components/Risk Factors/Patient Goals at Admission:  Personal Goals and Risk Factors at Admission - 01/02/24 1434       Core Components/Risk Factors/Patient Goals on Admission   Improve shortness of breath with ADL's Yes    Intervention Provide education, individualized exercise plan and daily activity instruction to help decrease symptoms of SOB with activities of daily living.    Expected Outcomes Short Term: Improve cardiorespiratory fitness to achieve a reduction of symptoms when performing ADLs;Long Term: Be able to perform more ADLs without symptoms or delay the onset of symptoms             Core Components/Risk Factors/Patient Goals Review:    Core Components/Risk Factors/Patient Goals at Discharge (Final Review):    ITP Comments:   Comments: Dr. Mechele Collin is Medical Director for Pulmonary Rehab at [facility].

## 2024-01-02 NOTE — Progress Notes (Signed)
Pulmonary Rehab Orientation Physical Assessment Note    Well appearing, A&Ox4, NAD Eyes/Ears: stated she needs an eye MD appointment to get updated glasses Lungs: Clear with no wheezes, rales, rhonchi, denies chronic cough, dyspnea on exertion. Jocelyn [name] stated she had oxygen ordered in November 2024 but never received it. She stated she is still working with her MD and DME company Heart: Regular rate rhythm, no murmurs, no rubs, no clicks Gastrointestinal: abdomin soft, + bowel sounds in all 4 quads, denies recent weight gain or loss, endorses normal BMs Genitourinary: WNL, pt denies s/s Extremities:  +2 pulses, grip strength equal, strong, no edema, no cyanosis, no clubbing Integumentary: pt denies any needs Assistive devices: Glucometer at home. Pt checks blood sugars every other day

## 2024-01-03 ENCOUNTER — Ambulatory Visit: Payer: BC Managed Care – PPO | Admitting: Neurology

## 2024-01-04 NOTE — Progress Notes (Signed)
Pulmonary Individual Treatment Plan  Patient Details  Name: Jocelyn [name] MRN: [id number] Date of Birth: 1956-10-05 Referring Provider:   Doristine Devoid Pulmonary Rehab Walk Test from 01/02/2024 in [facility]  Referring Provider Ramaswamy       Initial Encounter Date:  Flowsheet Row Pulmonary Rehab Walk Test from 01/02/2024 in [facility]  Date 01/02/24       Visit Diagnosis: Pulmonary embolism, unspecified chronicity, unspecified pulmonary embolism type, unspecified whether acute cor pulmonale present (HCC)  Chronic respiratory failure with hypoxia (HCC)  Patient's Home Medications on Admission:   Current Outpatient Medications:    allopurinol (ZYLOPRIM) 100 MG tablet, Take 1 tablet by mouth daily as needed., Disp: , Rfl:    ALPRAZolam (XANAX) 0.5 MG tablet, Take 0.5 mg by mouth 3 (three) times daily., Disp: , Rfl:    apixaban (ELIQUIS) 5 MG TABS tablet, Take 10 mg by mouth twice daily, then on 08/17/2023-switch to 5 mg by mouth twice daily., Disp: 180 tablet, Rfl: 3   azaTHIOprine (IMURAN) 50 MG tablet, Take 1 tablet by mouth daily., Disp: , Rfl:    benzonatate (TESSALON) 100 MG capsule, Take 1 capsule (100 mg total) by mouth 3 (three) times daily as needed for cough., Disp: 30 capsule, Rfl: 1   Empagliflozin-metFORMIN HCl ER 25-1000 MG TB24, Take 1 tablet by mouth daily., Disp: , Rfl:    GEMTESA 75 MG TABS, Take 1 tablet by mouth daily., Disp: , Rfl:    icosapent Ethyl (VASCEPA) 1 g capsule, TAKE 2 CAPSULES(2 GRAMS) BY MOUTH TWICE DAILY, Disp: 360 capsule, Rfl: 3   metoprolol succinate (TOPROL-XL) 100 MG 24 hr tablet, Take 1 tablet (100 mg total) by mouth daily. Take with or immediately following a meal., Disp: 90 tablet, Rfl: 3   NEXLETOL 180 MG TABS, TAKE 1 TABLET BY MOUTH DAILY, Disp: 90 tablet, Rfl: 1   nitroGLYCERIN (NITROSTAT) 0.4 MG SL tablet, Place 1  tablet (0.4 mg total) under the tongue every 5 (five) minutes as needed for chest pain., Disp: 25 tablet, Rfl: 2   ONETOUCH VERIO test strip, CHECK BLOOD SUGAR TID AS DIRECTED, Disp: , Rfl:    pantoprazole (PROTONIX) 40 MG tablet, Take 1 tablet (40 mg total) by mouth daily., Disp: 30 tablet, Rfl: 0   PRALUENT 150 MG/ML SOAJ, ADMINISTER 1 ML UNDER THE SKIN EVERY 14 DAYS, Disp: 6 mL, Rfl: 3   spironolactone (ALDACTONE) 25 MG tablet, TAKE 1 TABLET BY MOUTH EVERY DAY, Disp: 90 tablet, Rfl: 2  Past Medical History: Past Medical History:  Diagnosis Date   Acute bronchitis    Allergic rhinitis, cause unspecified    Anemia    Anxiety    B12 deficiency    BV (bacterial vaginosis) 06/22/1996   Calculus of kidney    Calculus of kidney    Coronary atherosclerosis of unspecified type of vessel, native or graft    Dizziness    Essential hypertension, benign    Fibroid 2003   Fibromyalgia    H/O dysmenorrhea 2008   H/O varicella    Headache(784.0)    frequently   HSV-2 infection 2009   Hyperplastic colon polyp 05/16/2014   Irritable bowel syndrome    Meniere's disease, unspecified    Menses, irregular 2003   Myalgia and myositis, unspecified    Myocardial infarction (HCC)    Obstructive sleep apnea (adult) (pediatric)    Perimenopausal symptoms  2003   Pure hypercholesterolemia    Sarcoidosis    Type II or unspecified type diabetes mellitus without mention of complication, not stated as uncontrolled    Unspecified venous (peripheral) insufficiency    Vitamin D deficiency    Vulvitis 2010   Yeast infection     Tobacco Use: Social History   Tobacco Use  Smoking Status Never  Smokeless Tobacco Never  Tobacco Comments   Daily Caffeine - 1  Exercise 2-3 times/weekly    Labs: Review Flowsheet  More data exists      Latest Ref Rng & Units 08/04/2023 08/05/2023 08/06/2023 08/07/2023 12/08/2023  Labs for ITP Cardiac and Pulmonary Rehab  Cholestrol 0 - 200 mg/dL - - - - 782   LDL (calc) 0  - 99 mg/dL - - - - 956   HDL-C >21 mg/dL - - - - 27   Trlycerides <150 mg/dL - - - - 308   PH, Arterial 7.35 - 7.45 7.494  7.184  7.105  6.837  7.445  - - -  PCO2 arterial 32 - 48 mmHg 27.7  20.6  28.2  38.6  36.1  - - -  Bicarbonate 20.0 - 28.0 mmol/L 21.2  8.1  8.9  6.5  24.7  - - -  TCO2 22 - 32 mmol/L 22  9  10  8  26   - - -  Acid-base deficit 0.0 - 2.0 mmol/L 1.0  19.0  19.0  26.0  - - - -  O2 Saturation % 66.7  57.8  100  99  99  96  99  69.2  70.6  79.7  -    Details       Multiple values from one day are sorted in reverse-chronological order         Capillary Blood Glucose: Lab Results  Component Value Date   GLUCAP 115 (H) 08/11/2023   GLUCAP 102 (H) 08/10/2023   GLUCAP 110 (H) 08/10/2023   GLUCAP 158 (H) 08/10/2023   GLUCAP 105 (H) 08/10/2023     Pulmonary Assessment Scores:  Pulmonary Assessment Scores     Row Name 01/02/24 1423         ADL UCSD   ADL Phase Entry     SOB Score total 39       CAT Score   CAT Score 16       mMRC Score   mMRC Score 3             UCSD: Self-administered rating of dyspnea associated with activities of daily living (ADLs) 6-point scale (0 = "not at all" to 5 = "maximal or unable to do because of breathlessness")  Scoring Scores range from 0 to 120.  Minimally important difference is 5 units  CAT: CAT can identify the health impairment of COPD patients and is better correlated with disease progression.  CAT has a scoring range of zero to 40. The CAT score is classified into four groups of low (less than 10), medium (10 - 20), high (21-30) and very high (31-40) based on the impact level of disease on health status. A CAT score over 10 suggests significant symptoms.  A worsening CAT score could be explained by an exacerbation, poor medication adherence, poor inhaler technique, or progression of COPD or comorbid conditions.  CAT MCID is 2 points  mMRC: mMRC (Modified Medical Research Council) Dyspnea Scale is used to  assess the degree of baseline functional disability in patients of respiratory disease due to  dyspnea. No minimal important difference is established. A decrease in score of 1 point or greater is considered a positive change.   Pulmonary Function Assessment:  Pulmonary Function Assessment - 01/02/24 1338       Breath   Bilateral Breath Sounds Clear    Shortness of Breath Yes;Fear of Shortness of Breath;Limiting activity             Exercise Target Goals: Exercise Program Goal: Individual exercise prescription set using results from initial 6 min walk test and THRR while considering  patient's activity barriers and safety.   Exercise Prescription Goal: Initial exercise prescription builds to 30-45 minutes a day of aerobic activity, 2-3 days per week.  Home exercise guidelines will be given to patient during program as part of exercise prescription that the participant will acknowledge.  Activity Barriers & Risk Stratification:  Activity Barriers & Cardiac Risk Stratification - 01/02/24 1339       Activity Barriers & Cardiac Risk Stratification   Activity Barriers Joint Problems;History of Falls;Deconditioning;Muscular Weakness;Shortness of Breath             6 Minute Walk:  6 Minute Walk     Row Name 01/02/24 1507         6 Minute Walk   Phase Initial     Distance 1060 feet     Walk Time 6 minutes     # of Rest Breaks 2  brief rest 1:57-2:10, 4:42-4:50     MPH 2.01     METS 2.39     RPE 12     Perceived Dyspnea  1     VO2 Peak 8.35     Symptoms Yes (comment)     Comments fatigue     Resting HR 60 bpm     Resting BP 122/64     Resting Oxygen Saturation  100 %     Exercise Oxygen Saturation  during 6 min walk 100 %     Max Ex. HR 89 bpm     Max Ex. BP 136/60     2 Minute Post BP 150/70       Interval HR   1 Minute HR 83     2 Minute HR 89     3 Minute HR 84     4 Minute HR 89     5 Minute HR 84     6 Minute HR 86     2 Minute Post HR 59      Interval Heart Rate? Yes       Interval Oxygen   Interval Oxygen? Yes     Baseline Oxygen Saturation % 100 %     1 Minute Oxygen Saturation % 100 %     1 Minute Liters of Oxygen 0 L     2 Minute Oxygen Saturation % 100 %     2 Minute Liters of Oxygen 0 L     3 Minute Oxygen Saturation % 100 %     3 Minute Liters of Oxygen 0 L     4 Minute Oxygen Saturation % 100 %     4 Minute Liters of Oxygen 0 L     5 Minute Oxygen Saturation % 100 %     5 Minute Liters of Oxygen 0 L     6 Minute Oxygen Saturation % 100 %     6 Minute Liters of Oxygen 0 L     2 Minute Post Oxygen Saturation % 100 %  2 Minute Post Liters of Oxygen 0 L              Oxygen Initial Assessment:  Oxygen Initial Assessment - 01/02/24 1337       Home Oxygen   Home Oxygen Device None    Sleep Oxygen Prescription None    Home Exercise Oxygen Prescription None    Home Resting Oxygen Prescription None      Initial 6 min Walk   Oxygen Used None      Program Oxygen Prescription   Program Oxygen Prescription None      Intervention   Short Term Goals To learn and understand importance of maintaining oxygen saturations>88%;To learn and understand importance of monitoring SPO2 with pulse oximeter and demonstrate accurate use of the pulse oximeter.;To learn and demonstrate proper pursed lip breathing techniques or other breathing techniques.     Long  Term Goals Exhibits proper breathing techniques, such as pursed lip breathing or other method taught during program session;Verbalizes importance of monitoring SPO2 with pulse oximeter and return demonstration;Maintenance of O2 saturations>88%             Oxygen Re-Evaluation:   Oxygen Discharge (Final Oxygen Re-Evaluation):   Initial Exercise Prescription:  Initial Exercise Prescription - 01/02/24 1500       Date of Initial Exercise RX and Referring Provider   Date 01/02/24    Referring Provider Ramaswamy    Expected Discharge Date 03/29/24       NuStep   Level 1    SPM 60    Minutes 15    METs 2      Recumbant Elliptical   Level 1    RPM 60    Minutes 15    METs 2      Prescription Details   Frequency (times per week) 2    Duration Progress to 30 minutes of continuous aerobic without signs/symptoms of physical distress      Intensity   THRR 40-80% of Max Heartrate 60-121    Ratings of Perceived Exertion 11-13    Perceived Dyspnea 0-4      Progression   Progression Continue to progress workloads to maintain intensity without signs/symptoms of physical distress.      Resistance Training   Training Prescription Yes    Weight yellow bands    Reps 10-15             Perform Capillary Blood Glucose checks as needed.  Exercise Prescription Changes:   Exercise Comments:   Exercise Goals and Review:   Exercise Goals     Row Name 01/02/24 1339             Exercise Goals   Increase Physical Activity Yes       Intervention Provide advice, education, support and counseling about physical activity/exercise needs.;Develop an individualized exercise prescription for aerobic and resistive training based on initial evaluation findings, risk stratification, comorbidities and participant's personal goals.       Expected Outcomes Short Term: Attend rehab on a regular basis to increase amount of physical activity.;Long Term: Exercising regularly at least 3-5 days a week.;Long Term: Add in home exercise to make exercise part of routine and to increase amount of physical activity.       Increase Strength and Stamina Yes       Intervention Provide advice, education, support and counseling about physical activity/exercise needs.;Develop an individualized exercise prescription for aerobic and resistive training based on initial evaluation findings, risk stratification, comorbidities and participant's  personal goals.       Expected Outcomes Short Term: Increase workloads from initial exercise prescription for resistance, speed,  and METs.;Short Term: Perform resistance training exercises routinely during rehab and add in resistance training at home;Long Term: Improve cardiorespiratory fitness, muscular endurance and strength as measured by increased METs and functional capacity ( )       Able to understand and use rate of perceived exertion (RPE) scale Yes       Intervention Provide education and explanation on how to use RPE scale       Expected Outcomes Short Term: Able to use RPE daily in rehab to express subjective intensity level;Long Term:  Able to use RPE to guide intensity level when exercising independently       Able to understand and use Dyspnea scale Yes       Intervention Provide education and explanation on how to use Dyspnea scale       Expected Outcomes Short Term: Able to use Dyspnea scale daily in rehab to express subjective sense of shortness of breath during exertion;Long Term: Able to use Dyspnea scale to guide intensity level when exercising independently       Knowledge and understanding of Target Heart Rate Range (THRR) Yes       Intervention Provide education and explanation of THRR including how the numbers were predicted and where they are located for reference       Expected Outcomes Short Term: Able to state/look up THRR;Short Term: Able to use daily as guideline for intensity in rehab;Long Term: Able to use THRR to govern intensity when exercising independently       Understanding of Exercise Prescription Yes       Intervention Provide education, explanation, and written materials on patient's individual exercise prescription       Expected Outcomes Short Term: Able to explain program exercise prescription;Long Term: Able to explain home exercise prescription to exercise independently                Exercise Goals Re-Evaluation :  Exercise Goals Re-Evaluation     Row Name 01/03/24 0746             Exercise Goal Re-Evaluation   Exercise Goals Review Increase Physical  Activity;Able to understand and use Dyspnea scale;Understanding of Exercise Prescription;Increase Strength and Stamina;Knowledge and understanding of Target Heart Rate Range (THRR);Able to understand and use rate of perceived exertion (RPE) scale       Comments Pt to begin exercise 01/10/24. Will monitor for progressions.       Expected Outcomes Through exercise at rehab and home, the patient will decrease shortness of breath with daily activities and feel confident in carrying out an exercise regimen at home                Discharge Exercise Prescription (Final Exercise Prescription Changes):   Nutrition:  Target Goals: Understanding of nutrition guidelines, daily intake of sodium 1500mg , cholesterol 200mg , calories 30% from fat and 7% or less from saturated fats, daily to have 5 or more servings of fruits and vegetables.  Biometrics:  Pre Biometrics - 01/02/24 1507       Pre Biometrics   Grip Strength 11 kg              Nutrition Therapy Plan and Nutrition Goals:   Nutrition Assessments:  MEDIFICTS Score Key: >=70 Need to make dietary changes  40-70 Heart Healthy Diet <= 40 Therapeutic Level Cholesterol Diet   Picture Your Plate  Scores: <40 Unhealthy dietary pattern with much room for improvement. 41-50 Dietary pattern unlikely to meet recommendations for good health and room for improvement. 51-60 More healthful dietary pattern, with some room for improvement.  >60 Healthy dietary pattern, although there may be some specific behaviors that could be improved.    Nutrition Goals Re-Evaluation:   Nutrition Goals Discharge (Final Nutrition Goals Re-Evaluation):   Psychosocial: Target Goals: Acknowledge presence or absence of significant depression and/or stress, maximize coping skills, provide positive support system. Participant is able to verbalize types and ability to use techniques and skills needed for reducing stress and depression.  Initial Review &  Psychosocial Screening:  Initial Psych Review & Screening - 01/02/24 1334       Initial Review   Current issues with None Identified      Family Dynamics   Good Support System? Yes      Barriers   Psychosocial barriers to participate in program There are no identifiable barriers or psychosocial needs.      Screening Interventions   Interventions Encouraged to exercise    Expected Outcomes Short Term goal: Utilizing psychosocial counselor, staff and physician to assist with identification of specific Stressors or current issues interfering with healing process. Setting desired goal for each stressor or current issue identified.;Long Term Goal: Stressors or current issues are controlled or eliminated.;Short Term goal: Identification and review with participant of any Quality of Life or Depression concerns found by scoring the questionnaire.;Long Term goal: The participant improves quality of Life and PHQ9 Scores as seen by post scores and/or verbalization of changes             Quality of Life Scores:  Scores of 19 and below usually indicate a poorer quality of life in these areas.  A difference of  2-3 points is a clinically meaningful difference.  A difference of 2-3 points in the total score of the Quality of Life Index has been associated with significant improvement in overall quality of life, self-image, physical symptoms, and general health in studies assessing change in quality of life.  PHQ-9: Review Flowsheet  More data may exist      01/02/2024 09/03/2020 10/25/2017 07/20/2017 12/03/2014  Depression screen PHQ 2/9  Decreased Interest 0 0 0 0 0  Down, Depressed, Hopeless 0 0 0 0 0  PHQ - 2 Score 0 0 0 0 0  Altered sleeping 1 - - - -  Tired, decreased energy 0 - - - -  Change in appetite 0 - - - -  Feeling bad or failure about yourself  0 - - - -  Trouble concentrating 0 - - - -  Moving slowly or fidgety/restless 0 - - - -  Suicidal thoughts 0 - - - -  PHQ-9 Score 1 - -  - -  Difficult doing work/chores Not difficult at all - - - -   Interpretation of Total Score  Total Score Depression Severity:  1-4 = Minimal depression, 5-9 = Mild depression, 10-14 = Moderate depression, 15-19 = Moderately severe depression, 20-27 = Severe depression   Psychosocial Evaluation and Intervention:  Psychosocial Evaluation - 01/02/24 1335       Psychosocial Evaluation & Interventions   Interventions Encouraged to exercise with the program and follow exercise prescription    Comments Jocelyn [name] denies any psychosocial needs at this time    Expected Outcomes Jocelyn [name] will be free of any psy/soc barriers or concerns and will exhibit positive outlook with good coping skills.  Continue Psychosocial Services  No Follow up required             Psychosocial Re-Evaluation:  Psychosocial Re-Evaluation     Row Name 01/03/24 1610             Psychosocial Re-Evaluation   Current issues with None Identified       Comments No changes since orientation. Jocelyn [name] is scheduled to start the program on 01/10/24       Expected Outcomes Jocelyn [name] will exhibit positive outlook wtih good coping skills and be free of any psy/soc barriers or concerns       Interventions Encouraged to attend Pulmonary Rehabilitation for the exercise       Continue Psychosocial Services  No Follow up required                Psychosocial Discharge (Final Psychosocial Re-Evaluation):  Psychosocial Re-Evaluation - 01/03/24 1610       Psychosocial Re-Evaluation   Current issues with None Identified    Comments No changes since orientation. Jocelyn [name] is scheduled to start the program on 01/10/24    Expected Outcomes Jocelyn [name] will exhibit positive outlook wtih good coping skills and be free of any psy/soc barriers or concerns    Interventions Encouraged to attend Pulmonary Rehabilitation for the exercise    Continue Psychosocial Services  No Follow up required             Education: Education Goals:  Education classes will be provided on a weekly basis, covering required topics. Participant will state understanding/return demonstration of topics presented.  Learning Barriers/Preferences:  Learning Barriers/Preferences - 01/02/24 1529       Learning Barriers/Preferences   Learning Barriers Sight    Learning Preferences Pictoral;Group Instruction;Individual Instruction;Verbal Instruction;Written Material             Education Topics: Know Your Numbers Group instruction that is supported by a PowerPoint presentation. Instructor discusses importance of knowing and understanding resting, exercise, and post-exercise oxygen saturation, heart rate, and blood pressure. Oxygen saturation, heart rate, blood pressure, rating of perceived exertion, and dyspnea are reviewed along with a normal range for these values.    Exercise for the Pulmonary Patient Group instruction that is supported by a PowerPoint presentation. Instructor discusses benefits of exercise, core components of exercise, frequency, duration, and intensity of an exercise routine, importance of utilizing pulse oximetry during exercise, safety while exercising, and options of places to exercise outside of rehab.    MET Level  Group instruction provided by PowerPoint, verbal discussion, and written material to support subject matter. Instructor reviews what METs are and how to increase METs.    Pulmonary Medications Verbally interactive group education provided by instructor with focus on inhaled medications and proper administration.   Anatomy and Physiology of the Respiratory System Group instruction provided by PowerPoint, verbal discussion, and written material to support subject matter. Instructor reviews respiratory cycle and anatomical components of the respiratory system and their functions. Instructor also reviews differences in obstructive and restrictive respiratory diseases with examples of each.    Oxygen  Safety Group instruction provided by PowerPoint, verbal discussion, and written material to support subject matter. There is an overview of "What is Oxygen" and "Why do we need it".  Instructor also reviews how to create a safe environment for oxygen use, the importance of using oxygen as prescribed, and the risks of noncompliance. There is a brief discussion on traveling with oxygen and resources the patient may utilize.   Oxygen Use Group  instruction provided by PowerPoint, verbal discussion, and written material to discuss how supplemental oxygen is prescribed and different types of oxygen supply systems. Resources for more information are provided.    Breathing Techniques Group instruction that is supported by demonstration and informational handouts. Instructor discusses the benefits of pursed lip and diaphragmatic breathing and detailed demonstration on how to perform both.     Risk Factor Reduction Group instruction that is supported by a PowerPoint presentation. Instructor discusses the definition of a risk factor, different risk factors for pulmonary disease, and how the heart and lungs work together.   Pulmonary Diseases Group instruction provided by PowerPoint, verbal discussion, and written material to support subject matter. Instructor gives an overview of the different type of pulmonary diseases. There is also a discussion on risk factors and symptoms as well as ways to manage the diseases.   Stress and Energy Conservation Group instruction provided by PowerPoint, verbal discussion, and written material to support subject matter. Instructor gives an overview of stress and the impact it can have on the body. Instructor also reviews ways to reduce stress. There is also a discussion on energy conservation and ways to conserve energy throughout the day.   Warning Signs and Symptoms Group instruction provided by PowerPoint, verbal discussion, and written material to support subject  matter. Instructor reviews warning signs and symptoms of stroke, heart attack, cold and flu. Instructor also reviews ways to prevent the spread of infection.   Other Education Group or individual verbal, written, or video instructions that support the educational goals of the pulmonary rehab program. Flowsheet Row CARDIAC REHAB MAINTENANCE EXERCISE from 10/28/2017 in [facility]  Date 09/23/17  [[id number] Eating Survival Tips]  Educator RD  Instruction Review Code 2- Demonstrated Understanding        Knowledge Questionnaire Score:  Knowledge Questionnaire Score - 01/02/24 1425       Knowledge Questionnaire Score   Pre Score 16/18             Core Components/Risk Factors/Patient Goals at Admission:  Personal Goals and Risk Factors at Admission - 01/02/24 1434       Core Components/Risk Factors/Patient Goals on Admission   Improve shortness of breath with ADL's Yes    Intervention Provide education, individualized exercise plan and daily activity instruction to help decrease symptoms of SOB with activities of daily living.    Expected Outcomes Short Term: Improve cardiorespiratory fitness to achieve a reduction of symptoms when performing ADLs;Long Term: Be able to perform more ADLs without symptoms or delay the onset of symptoms             Core Components/Risk Factors/Patient Goals Review:   Goals and Risk Factor Review     Row Name 01/03/24 1612             Core Components/Risk Factors/Patient Goals Review   Personal Goals Review Improve shortness of breath with ADL's;Develop more efficient breathing techniques such as purse lipped breathing and diaphragmatic breathing and practicing self-pacing with activity.       Review Unable to assess, pt has not started the program yet. Jocelyn [name] is scheduled to start the program next week       Expected Outcomes Jocelyn [name] will improve her shortness of breath with ADLs and  develop more efficient breathing techniques such as purse lipped breathing and diaphragmatic breathing; and practicing self-pacing with activity  Core Components/Risk Factors/Patient Goals at Discharge (Final Review):   Goals and Risk Factor Review - 01/03/24 1612       Core Components/Risk Factors/Patient Goals Review   Personal Goals Review Improve shortness of breath with ADL's;Develop more efficient breathing techniques such as purse lipped breathing and diaphragmatic breathing and practicing self-pacing with activity.    Review Unable to assess, pt has not started the program yet. Jocelyn [name] is scheduled to start the program next week    Expected Outcomes Jocelyn [name] will improve her shortness of breath with ADLs and develop more efficient breathing techniques such as purse lipped breathing and diaphragmatic breathing; and practicing self-pacing with activity             ITP Comments: Pt is scheduled to begin exercise on 2/25. Recommend continued exercise, life style modification, education, and utilization of breathing techniques to increase stamina and strength, while also decreasing shortness of breath with exertion.  Dr. Mechele Collin is Medical Director for Pulmonary Rehab at [facility].

## 2024-01-05 ENCOUNTER — Telehealth: Payer: Self-pay | Admitting: Primary Care

## 2024-01-05 NOTE — Telephone Encounter (Signed)
The test page for the walk test needs to be signed and faxed from back in November. Needs to be signed and faxed today to [phone]

## 2024-01-06 ENCOUNTER — Telehealth (HOSPITAL_COMMUNITY): Payer: Self-pay | Admitting: *Deleted

## 2024-01-06 NOTE — Telephone Encounter (Signed)
Pt called to voice that she would like to be changed to Pulmonary Rehab 10:00 class instead of 1:00 class. Will change appts. Ethelda Chick BS, ACSM-CEP 01/06/2024 10:49 AM

## 2024-01-06 NOTE — Telephone Encounter (Signed)
PCC's, and you guys assist with this? Do you have a copy of the test page for the walk test? Is this something that can be printed? No contact number provided for [employer].

## 2024-01-06 NOTE — Telephone Encounter (Signed)
The test has been signed by NP [facility]. I faxed it over to Casimiro Needle with [employer] at [phone].

## 2024-01-09 ENCOUNTER — Telehealth: Payer: Self-pay | Admitting: Primary Care

## 2024-01-09 NOTE — Telephone Encounter (Signed)
 The form from [employer] just needs the signature and  dated  from [name] , oxygen walk test. Needs to be sent today. Just that page. Needs to be done today.

## 2024-01-09 NOTE — Telephone Encounter (Signed)
 I spoke with Casimiro Needle from [place] two hours ago. The paper has been signed already, as well as the signature for the test page. Just needed to be refaxed over to them. Nothing further needed.

## 2024-01-09 NOTE — Telephone Encounter (Signed)
 Do I have it? Happy to sign

## 2024-01-10 ENCOUNTER — Encounter (HOSPITAL_COMMUNITY)
Admission: RE | Admit: 2024-01-10 | Discharge: 2024-01-10 | Disposition: A | Discharge status: Home / Self Care | Payer: Medicare PPO | Source: Ambulatory Visit | Attending: Internal Medicine | Admitting: Internal Medicine

## 2024-01-10 DIAGNOSIS — J9611 Chronic respiratory failure with hypoxia: Secondary | ICD-10-CM | POA: Diagnosis not present

## 2024-01-10 DIAGNOSIS — I2699 Other pulmonary embolism without acute cor pulmonale: Secondary | ICD-10-CM | POA: Diagnosis not present

## 2024-01-10 LAB — GLUCOSE, CAPILLARY
Glucose-Capillary: 98 mg/dL (ref 70–99)
Glucose-Capillary: 90 mg/dL (ref 70–99)

## 2024-01-10 NOTE — Telephone Encounter (Signed)
 Thanks

## 2024-01-10 NOTE — Progress Notes (Signed)
 Daily Session Note  Patient Details  Name: Jocelyn [name] MRN: [id number] Date of Birth: 03/05/1956 Referring Provider:   Doristine Devoid Pulmonary Rehab Walk Test from 01/02/2024 in [facility] for Heart, Vascular, & Lung Health  Referring Provider Ramaswamy       Encounter Date: 01/10/2024  Check In:  Session Check In - 01/10/24 1131       Check-In   Supervising physician immediately available to respond to emergencies [facility] MD immediately available    Physician(s) Robin Searing, NP    Location [facility]    Staff Present [name], RN, BSN;Johnny Hale Bogus, MS, Exercise Physiologist;Casey Charlean Sanfilippo, MS, ACSM-CEP, Exercise Physiologist    Virtual Visit No    Medication changes reported     No    Fall or balance concerns reported    Yes    Tobacco Cessation No Change    Warm-up and Cool-down Performed as group-led instruction   only   Resistance Training Performed Yes    VAD Patient? No    PAD/SET Patient? No      Pain Assessment   Currently in Pain? No/denies    Multiple Pain Sites No             Capillary Blood Glucose: Results for orders placed or performed during the hospital encounter of 01/02/24 (from the past 24 hours)  Glucose, capillary     Status: None   Collection Time: 01/10/24 10:31 AM  Result Value Ref Range   Glucose-Capillary 98 70 - 99 mg/dL      Social History   Tobacco Use  Smoking Status Never  Smokeless Tobacco Never  Tobacco Comments   Daily Caffeine - 1  Exercise 2-3 times/weekly    Goals Met:  Proper associated with RPD/PD & O2 Sat Exercise tolerated well No report of concerns or symptoms today Strength training completed today  Goals Unmet:  Not Applicable  Comments: Service time is from 1026 to 1145.    Dr. Mechele Collin is Medical Director for Pulmonary Rehab at [facility].

## 2024-01-11 ENCOUNTER — Telehealth (HOSPITAL_COMMUNITY): Payer: Self-pay | Admitting: Pharmacy Technician

## 2024-01-11 ENCOUNTER — Other Ambulatory Visit (HOSPITAL_COMMUNITY): Payer: Self-pay

## 2024-01-11 NOTE — Telephone Encounter (Signed)
 Pharmacy Patient Advocate Encounter  Insurance verification completed.   The patient is insured through [employer] test claim for Eliquis. Currently a quantity of 180 is a 90 day supply and the co-pay is $80.  This test claim was processed through [facility]- copay amounts may vary at other pharmacies due to pharmacy/plan contracts, or as the patient moves through the different stages of their insurance plan.   Archer Asa, CPhT

## 2024-01-11 NOTE — Progress Notes (Signed)
 Paramedicine Encounter    Patient ID: Jocelyn [name], female    DOB: 1956/09/07, 68 y.o.   MRN: [id number]   Complaints-none   Edema-none   Compliance with meds-yes  Pill box filled-yes  If so, by whom-pt filled it and it was correct   Refills needed-none right now    Pt reports she is doing ok. She denies any complaints.  She started pulm rehab yesterday.  She reports her breathing is doing ok. No c/p, no dizziness.  She has about 3 wks left of eliquis, then she will need a refill-worried about that price. The synjardy may be costly as well. So will keep track of this.  She is back and forth to her home and staying here with her friend.  She reports still having to do things to the house before she can stay there.  She is back at work part time, feeling ok.  Not sleeping ok. Tossing and turning.  She is not waking up sob or gasping.  I see the orders for her 02 was faxed from pulm so [employer] should be reaching out to get that set up.  Would still like to follow her until she fully gets in her own home alone, right now she stays with her friend a lot, they are still doing some things to her home to make is safer.  She filled her own pill boxes and it was accurate and she is doing better with compliance.   BP 122/78   Pulse 68   Resp 15   Wt 146 lb (66.2 kg)   SpO2 97%   BMI 26.70 kg/m  Weight yesterday-148 @ rehab  Last visit weight-144   Patient Care Team: Renaye Rakers, MD as PCP - General (Family Medicine) Corky Crafts, MD as PCP - Cardiology (Cardiology)  Patient Active Problem List   Diagnosis Date Noted   Physical deconditioning 08/23/2023   Acute respiratory failure with hypoxia (HCC) 08/05/2023   Pulmonary embolism (HCC) 08/03/2023   Pulmonary edema 08/03/2023   Degenerative arthritis of knee 08/20/2021   Hx of pleural effusion    Dyspnea 08/16/2018   Fall on or from sidewalk curb, initial encounter 05/31/2018   History of acute inferior wall MI  08/22/2017   Diabetes mellitus (HCC)    S/P right coronary artery (RCA) stent placement    Overweight 05/05/2017   Diabetes mellitus type 2, controlled, without complications (HCC) 12/16/2016   Asthmatic bronchitis , chronic (HCC) 05/10/2016   Polymyositis (HCC) 09/15/2015   History of sarcoidosis 09/15/2015   Falling 03/13/2015   Edema 02/13/2014   Coronary artery disease involving native heart without angina pectoris 10/29/2013   Hyperlipidemia 10/29/2013   Iron (Fe) deficiency anemia 08/08/2013   Vitamin B 12 deficiency 08/08/2013   GERD (gastroesophageal reflux disease) 09/08/2011   Weakness 02/22/2011   Depression 02/22/2011   MENIERE'S DISEASE 04/30/2009   Vitamin D deficiency 02/05/2009   Venous (peripheral) insufficiency 12/12/2007   RENAL CALCULUS 12/12/2007   DIZZINESS 12/12/2007   HYPERCHOLESTEROLEMIA 09/16/2007   Anxiety state 09/16/2007   HYPERTENSION, BENIGN 09/16/2007   Allergic rhinitis 09/16/2007   IRRITABLE BOWEL SYNDROME 09/16/2007   Myalgia and myositis 09/16/2007    Current Outpatient Medications:    allopurinol (ZYLOPRIM) 100 MG tablet, Take 1 tablet by mouth daily as needed., Disp: , Rfl:    ALPRAZolam (XANAX) 0.5 MG tablet, Take 0.5 mg by mouth 3 (three) times daily., Disp: , Rfl:    apixaban (ELIQUIS) 5 MG TABS tablet,  Take 10 mg by mouth twice daily, then on 08/17/2023-switch to 5 mg by mouth twice daily., Disp: 180 tablet, Rfl: 3   azaTHIOprine (IMURAN) 50 MG tablet, Take 1 tablet by mouth daily., Disp: , Rfl:    Empagliflozin-metFORMIN HCl ER 25-1000 MG TB24, Take 1 tablet by mouth daily., Disp: , Rfl:    GEMTESA 75 MG TABS, Take 1 tablet by mouth daily., Disp: , Rfl:    icosapent Ethyl (VASCEPA) 1 g capsule, TAKE 2 CAPSULES(2 GRAMS) BY MOUTH TWICE DAILY, Disp: 360 capsule, Rfl: 3   metoprolol succinate (TOPROL-XL) 100 MG 24 hr tablet, Take 1 tablet (100 mg total) by mouth daily. Take with or immediately following a meal., Disp: 90 tablet, Rfl: 3    NEXLETOL 180 MG TABS, TAKE 1 TABLET BY MOUTH DAILY, Disp: 90 tablet, Rfl: 1   ONETOUCH VERIO test strip, CHECK BLOOD SUGAR TID AS DIRECTED, Disp: , Rfl:    pantoprazole (PROTONIX) 40 MG tablet, Take 1 tablet (40 mg total) by mouth daily., Disp: 30 tablet, Rfl: 0   PRALUENT 150 MG/ML SOAJ, ADMINISTER 1 ML UNDER THE SKIN EVERY 14 DAYS, Disp: 6 mL, Rfl: 3   spironolactone (ALDACTONE) 25 MG tablet, TAKE 1 TABLET BY MOUTH EVERY DAY, Disp: 90 tablet, Rfl: 2   benzonatate (TESSALON) 100 MG capsule, Take 1 capsule (100 mg total) by mouth 3 (three) times daily as needed for cough., Disp: 30 capsule, Rfl: 1   nitroGLYCERIN (NITROSTAT) 0.4 MG SL tablet, Place 1 tablet (0.4 mg total) under the tongue every 5 (five) minutes as needed for chest pain., Disp: 25 tablet, Rfl: 2 Allergies  Allergen Reactions   Actos [Pioglitazone] Other (See Comments)    REACTION: pt states INTOL w/ edema   Codeine Other (See Comments)    REACTION: vomiting   Januvia [Sitagliptin] Nausea Only   Lactose Intolerance (Gi) Diarrhea   Morphine Nausea Only and Nausea And Vomiting    REACTION: vomiting REACTION: vomiting   Repatha [Evolocumab]     WAS NOT EFFECTIVE IN LOWERING LDL      Social History   Socioeconomic History   Marital status: Single    Spouse name: Not on file   Number of children: 1   Years of education: Not on file   Highest education level: Master's degree (e.g., MA, MS, MEng, MEd, MSW, MBA)  Occupational History   Occupation: Retired Runner, broadcasting/film/video   Tobacco Use   Smoking status: Never   Smokeless tobacco: Never   Tobacco comments:    Daily Caffeine - 1  Exercise 2-3 times/weekly  Vaping Use   Vaping status: Never Used  Substance and Sexual Activity   Alcohol use: No    Alcohol/week: 0.0 standard drinks of alcohol   Drug use: No   Sexual activity: Yes    Birth control/protection: None  Other Topics Concern   Not on file  Social History Narrative   Exercises 2-3 times weekly   Caffeine Use: 1  daily (tea or Pepsi)   Lives alone in a one story home.  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  Retired Midwife.     Social Drivers of Corporate investment banker Strain: Low Risk  (08/05/2023)   Overall Financial Resource Strain (CARDIA)    Difficulty of Paying Living Expenses: Not hard at all  Food Insecurity: No Food Insecurity (08/05/2023)   Hunger Vital Sign    Worried About Running Out of Food in the Last Year: Never true    Ran Out of Food  in the Last Year: Never true  Transportation Needs: No Transportation Needs (08/05/2023)   PRAPARE - Administrator, Civil Service (Medical): No    Lack of Transportation (Non-Medical): No  Physical Activity: Not on file  Stress: No Stress Concern Present (08/05/2023)   [employer] of Occupational Health - Occupational Stress Questionnaire    Feeling of Stress : Not at all  Social Connections: Not on file  Intimate Partner Violence: Not At Risk (08/05/2023)   Humiliation, Afraid, Rape, and Kick questionnaire    Fear of Current or Ex-Partner: No    Emotionally Abused: No    Physically Abused: No    Sexually Abused: No    Physical Exam      Future Appointments  Date Time Provider Department Center  01/12/2024 10:15 AM [facility] UNDERGRAD [facility] None  01/17/2024 10:15 AM [facility] REH[facility] UNDERGRAD [facility] None  01/19/2024 10:15 AM [facility] REH[facility] UNDERGRAD [facility] None  01/24/2024 10:15 AM [facility] REH[facility] UNDERGRAD [facility] None  01/26/2024 10:15 AM [facility] REH[facility] UNDERGRAD [facility] None  01/30/2024  3:00 PM Kathleene Hazel, MD [facility]  01/31/2024 10:15 AM [facility] REH[facility] UNDERGRAD [facility] None  02/02/2024 10:15 AM [facility] REH[facility] UNDERGRAD [facility] None  02/07/2024 10:15 AM [facility]ARY REHAB UNDERGRAD [facility] None  02/09/2024 10:15 AM [facility]PUL[facility]ARY REHAB UNDERGRAD [facility] None  02/10/2024  2:30 PM [facility] PHARMACIST [facility]  02/14/2024 10:15 AM [facility]PULMONARY  REHAB UNDERGRAD [facility] None  02/16/2024 10:15 AM [facility]ARY REHAB UNDERGRAD [facility] None  02/21/2024 10:15 AM [facility] REHAB UNDERGRAD [facility] None  02/23/2024 10:15 AM [facility]UL[facility]ARY REHAB UNDERGRAD [facility] None  02/28/2024 10:15 AM [facility]ARY REHAB UNDERGRAD [facility] None  03/01/2024 10:15 AM [facility]UL[facility]ARY REHAB UNDERGRAD [facility] None  03/06/2024 10:15 AM [facility]MONARY REHAB UNDERGRAD [facility] None  03/08/2024 10:15 AM [facility]PULMONARY REHAB UNDERGRAD [facility] None  03/13/2024 10:15 AM [facility]ARY REHAB UNDERGRAD [facility] None  03/15/2024 10:15 AM [facility]ULMONARY REHAB UNDERGRAD [facility] None  03/20/2024 10:15 AM [facility]ULMONARY REHAB UNDERGRAD [facility] None  03/22/2024 10:15 AM [facility]PULMONARY REHAB UNDERGRAD [facility] None  03/27/2024 10:15 AM [facility]PULMONARY REHAB UNDERGRAD [facility] None  03/29/2024 10:15 AM [facility]PULMONARY REHAB UNDERGRAD [facility] None  06/06/2024  2:00 PM [facility] PA/NP [facility] None  07/30/2024  1:45 PM Windell Norfolk, MD [facility] None       Kerry Hough, Paramedic [phone] [facility] Paramedic  01/11/24

## 2024-01-12 ENCOUNTER — Encounter (HOSPITAL_COMMUNITY)
Admission: RE | Admit: 2024-01-12 | Discharge: 2024-01-12 | Disposition: A | Discharge status: Home / Self Care | Payer: Medicare PPO | Source: Ambulatory Visit | Attending: Internal Medicine | Admitting: Internal Medicine

## 2024-01-12 DIAGNOSIS — I2699 Other pulmonary embolism without acute cor pulmonale: Secondary | ICD-10-CM | POA: Diagnosis not present

## 2024-01-12 DIAGNOSIS — J9611 Chronic respiratory failure with hypoxia: Secondary | ICD-10-CM | POA: Diagnosis not present

## 2024-01-12 LAB — GLUCOSE, CAPILLARY
Glucose-Capillary: 110 mg/dL — ABNORMAL HIGH (ref 70–99)
Glucose-Capillary: 104 mg/dL — ABNORMAL HIGH (ref 70–99)

## 2024-01-12 NOTE — Progress Notes (Signed)
 Daily Session Note  Patient Details  Name: Jocelyn [name] MRN: [id number] Date of Birth: 01/07/1956 Referring Provider:   Doristine Devoid Pulmonary Rehab Walk Test from 01/02/2024 in [facility] for Heart, Vascular, & Lung Health  Referring Provider Ramaswamy       Encounter Date: 01/12/2024  Check In:  Session Check In - 01/12/24 1057       Check-In   Supervising physician immediately available to respond to emergencies [facility] MD immediately available    Physician(s) Bernadene Person, NP    Location [facility]    Staff Present [name], RN, BSN;Casey Smith, Zella Richer, MS, ACSM-CEP, Exercise Physiologist;Jetta Dan Humphreys BS, ACSM-CEP, Exercise Physiologist;Randi Idelle Crouch BS, ACSM-CEP, Exercise Physiologist;Samantha Belarus, RD, LDN    Virtual Visit No    Medication changes reported     No    Tobacco Cessation No Change    Warm-up and Cool-down Performed as group-led instruction    Resistance Training Performed Yes    VAD Patient? No    PAD/SET Patient? No      Pain Assessment   Currently in Pain? No/denies             Capillary Blood Glucose: Results for orders placed or performed during the hospital encounter of 01/10/24 (from the past 24 hours)  Glucose, capillary     Status: Abnormal   Collection Time: 01/12/24 10:22 AM  Result Value Ref Range   Glucose-Capillary 110 (H) 70 - 99 mg/dL  Glucose, capillary     Status: Abnormal   Collection Time: 01/12/24 11:48 AM  Result Value Ref Range   Glucose-Capillary 104 (H) 70 - 99 mg/dL      Social History   Tobacco Use  Smoking Status Never  Smokeless Tobacco Never  Tobacco Comments   Daily Caffeine - 1  Exercise 2-3 times/weekly    Goals Met:  Proper associated with RPD/PD & O2 Sat Exercise tolerated well No report of concerns or symptoms today Strength training completed today  Goals Unmet:  Not Applicable  Comments: Service time is from 1019 to 1150.    Dr. Mechele Collin is Medical Director for Pulmonary Rehab at [facility].

## 2024-01-13 DIAGNOSIS — I2699 Other pulmonary embolism without acute cor pulmonale: Secondary | ICD-10-CM | POA: Diagnosis not present

## 2024-01-17 ENCOUNTER — Encounter (HOSPITAL_COMMUNITY)
Admission: RE | Admit: 2024-01-17 | Discharge: 2024-01-17 | Disposition: A | Discharge status: Home / Self Care | Payer: Medicare PPO | Source: Ambulatory Visit | Attending: Internal Medicine | Admitting: Internal Medicine

## 2024-01-17 ENCOUNTER — Other Ambulatory Visit: Payer: Self-pay | Admitting: Interventional Cardiology

## 2024-01-17 VITALS — Wt 149.7 lb

## 2024-01-17 DIAGNOSIS — J9611 Chronic respiratory failure with hypoxia: Secondary | ICD-10-CM | POA: Insufficient documentation

## 2024-01-17 DIAGNOSIS — I2699 Other pulmonary embolism without acute cor pulmonale: Secondary | ICD-10-CM | POA: Diagnosis not present

## 2024-01-17 NOTE — Progress Notes (Signed)
 Daily Session Note  Patient Details  Name: Jocelyn [name] MRN: [id number] Date of Birth: June 26, 1956 Referring Provider:   Doristine Devoid Pulmonary Rehab Walk Test from 01/02/2024 in [facility] for Heart, Vascular, & Lung Health  Referring Provider Ramaswamy       Encounter Date: 01/17/2024  Check In:  Session Check In - 01/17/24 1027       Check-In   Supervising physician immediately available to respond to emergencies [facility] MD immediately available    Physician(s) Bernadene Person, NP    Location [facility]    Staff Present [name], RN, BSN;Casey Smith, Zella Richer, MS, ACSM-CEP, Exercise Physiologist;Randi Idelle Crouch BS, ACSM-CEP, Exercise Physiologist;Samantha Belarus, RD, LDN    Virtual Visit No    Medication changes reported     No    Fall or balance concerns reported    Yes    Tobacco Cessation No Change    Warm-up and Cool-down Performed as group-led instruction    Resistance Training Performed Yes    VAD Patient? No    PAD/SET Patient? No      Pain Assessment   Currently in Pain? No/denies    Multiple Pain Sites No             Capillary Blood Glucose: No results found for this or any previous visit (from the past 24 hours).   Exercise Prescription Changes - 01/17/24 1100       Response to Exercise   Blood Pressure (Admit) 110/74    Blood Pressure (Exercise) 130/70    Blood Pressure (Exit) 108/60    Heart Rate (Admit) 84 bpm    Heart Rate (Exercise) 84 bpm    Heart Rate (Exit) 76 bpm    Oxygen Saturation (Admit) 96 %    Oxygen Saturation (Exercise) 96 %    Oxygen Saturation (Exit) 97 %    Rating of Perceived Exertion (Exercise) 14    Perceived Dyspnea (Exercise) 2    Duration Continue with 30 min of aerobic exercise without signs/symptoms of physical distress.    Intensity THRR unchanged      Progression   Progression Continue to progress workloads to maintain intensity without signs/symptoms of physical  distress.      Resistance Training   Training Prescription Yes    Weight yellow bands    Reps 10-15    Time 10 Minutes      Recumbant Bike   Level 2    Minutes 15    METs 1.7      NuStep   Level 2    SPM 67    Minutes 15    METs 1.6             Social History   Tobacco Use  Smoking Status Never  Smokeless Tobacco Never  Tobacco Comments   Daily Caffeine - 1  Exercise 2-3 times/weekly    Goals Met:  Exercise tolerated well No report of concerns or symptoms today Strength training completed today  Goals Unmet:  Not Applicable  Comments: Service time is from 1010 to 1130    Dr. Mechele Collin is Medical Director for Pulmonary Rehab at [facility].

## 2024-01-18 DIAGNOSIS — J399 Disease of upper respiratory tract, unspecified: Secondary | ICD-10-CM | POA: Diagnosis not present

## 2024-01-18 DIAGNOSIS — E1169 Type 2 diabetes mellitus with other specified complication: Secondary | ICD-10-CM | POA: Diagnosis not present

## 2024-01-18 DIAGNOSIS — E782 Mixed hyperlipidemia: Secondary | ICD-10-CM | POA: Diagnosis not present

## 2024-01-18 DIAGNOSIS — I2692 Saddle embolus of pulmonary artery without acute cor pulmonale: Secondary | ICD-10-CM | POA: Diagnosis not present

## 2024-01-18 DIAGNOSIS — F330 Major depressive disorder, recurrent, mild: Secondary | ICD-10-CM | POA: Diagnosis not present

## 2024-01-18 DIAGNOSIS — I10 Essential (primary) hypertension: Secondary | ICD-10-CM | POA: Diagnosis not present

## 2024-01-18 DIAGNOSIS — M3320 Polymyositis, organ involvement unspecified: Secondary | ICD-10-CM | POA: Diagnosis not present

## 2024-01-18 DIAGNOSIS — G4700 Insomnia, unspecified: Secondary | ICD-10-CM | POA: Diagnosis not present

## 2024-01-19 ENCOUNTER — Encounter (HOSPITAL_COMMUNITY)
Admission: RE | Admit: 2024-01-19 | Discharge: 2024-01-19 | Disposition: A | Discharge status: Home / Self Care | Payer: Medicare PPO | Source: Ambulatory Visit | Attending: Internal Medicine | Admitting: Internal Medicine

## 2024-01-19 DIAGNOSIS — I2699 Other pulmonary embolism without acute cor pulmonale: Secondary | ICD-10-CM

## 2024-01-19 DIAGNOSIS — J9611 Chronic respiratory failure with hypoxia: Secondary | ICD-10-CM | POA: Diagnosis not present

## 2024-01-19 NOTE — Progress Notes (Signed)
 Daily Session Note  Patient Details  Name: Jocelyn [name] MRN: [id number] Date of Birth: May 11, 1956 Referring Provider:   Doristine Devoid Pulmonary Rehab Walk Test from 01/02/2024 in [facility] for Heart, Vascular, & Lung Health  Referring Provider Ramaswamy       Encounter Date: 01/19/2024  Check In:  Session Check In - 01/19/24 1031       Check-In   Supervising physician immediately available to respond to emergencies [facility] MD immediately available    Physician(s) Robin Searing, NP    Location [facility]    Staff Present [name], RN, BSN;Casey Katrinka Blazing, Zella Richer, MS, ACSM-CEP, Exercise Physiologist;Randi [facility], ACSM-CEP, Exercise Physiologist    Virtual Visit No    Medication changes reported     No    Fall or balance concerns reported    No    Tobacco Cessation No Change    Warm-up and Cool-down Performed as group-led instruction    Resistance Training Performed Yes    VAD Patient? No    PAD/SET Patient? No      Pain Assessment   Currently in Pain? No/denies             Capillary Blood Glucose: No results found for this or any previous visit (from the past 24 hours).    Social History   Tobacco Use  Smoking Status Never  Smokeless Tobacco Never  Tobacco Comments   Daily Caffeine - 1  Exercise 2-3 times/weekly    Goals Met:  Exercise tolerated well No report of concerns or symptoms today Strength training completed today  Goals Unmet:  Not Applicable  Comments: Service time is from 1017 to 1131    Dr. Mechele Collin is Medical Director for Pulmonary Rehab at [facility].

## 2024-01-19 NOTE — Telephone Encounter (Signed)
 This is a CHF pt

## 2024-01-24 ENCOUNTER — Other Ambulatory Visit (HOSPITAL_COMMUNITY): Payer: Self-pay

## 2024-01-24 ENCOUNTER — Encounter (HOSPITAL_COMMUNITY)
Admission: RE | Admit: 2024-01-24 | Discharge: 2024-01-24 | Disposition: A | Discharge status: Home / Self Care | Payer: Medicare PPO | Source: Ambulatory Visit | Attending: Internal Medicine

## 2024-01-24 DIAGNOSIS — I2699 Other pulmonary embolism without acute cor pulmonale: Secondary | ICD-10-CM

## 2024-01-24 DIAGNOSIS — J9611 Chronic respiratory failure with hypoxia: Secondary | ICD-10-CM | POA: Diagnosis not present

## 2024-01-24 NOTE — Progress Notes (Signed)
 Daily Session Note  Patient Details  Name: Jocelyn [name] MRN: [id number] Date of Birth: 10/25/1956 Referring Provider:   Doristine Devoid Pulmonary Rehab Walk Test from 01/02/2024 in [facility] for Heart, Vascular, & Lung Health  Referring Provider Ramaswamy       Encounter Date: 01/24/2024  Check In:  Session Check In - 01/24/24 1135       Check-In   Supervising physician immediately available to respond to emergencies [facility] MD immediately available    Physician(s) Tereso Newcomer, PA    Location [facility]    Staff Present Durel Salts, Zella Richer, MS, ACSM-CEP, Exercise Physiologist;Randi Dionisio Paschal, ACSM-CEP, Exercise Physiologist    Virtual Visit No    Medication changes reported     No    Fall or balance concerns reported    No    Tobacco Cessation No Change    Warm-up and Cool-down Performed as group-led instruction    Resistance Training Performed Yes    VAD Patient? No    PAD/SET Patient? No      Pain Assessment   Currently in Pain? No/denies    Multiple Pain Sites No             Capillary Blood Glucose: No results found for this or any previous visit (from the past 24 hours).    Social History   Tobacco Use  Smoking Status Never  Smokeless Tobacco Never  Tobacco Comments   Daily Caffeine - 1  Exercise 2-3 times/weekly    Goals Met:  Proper associated with RPD/PD & O2 Sat Independence with exercise equipment Exercise tolerated well No report of concerns or symptoms today Strength training completed today  Goals Unmet:  Not Applicable  Comments: Service time is from 1018 to 1126.    Dr. Mechele Collin is Medical Director for Pulmonary Rehab at [facility].

## 2024-01-25 ENCOUNTER — Other Ambulatory Visit (HOSPITAL_COMMUNITY): Payer: Self-pay

## 2024-01-26 ENCOUNTER — Other Ambulatory Visit (HOSPITAL_COMMUNITY): Payer: Self-pay

## 2024-01-26 ENCOUNTER — Encounter (HOSPITAL_COMMUNITY)
Admission: RE | Admit: 2024-01-26 | Discharge: 2024-01-26 | Disposition: A | Discharge status: Home / Self Care | Payer: Medicare PPO | Source: Ambulatory Visit | Attending: Internal Medicine | Admitting: Internal Medicine

## 2024-01-26 VITALS — Wt 149.7 lb

## 2024-01-26 DIAGNOSIS — I2699 Other pulmonary embolism without acute cor pulmonale: Secondary | ICD-10-CM | POA: Diagnosis not present

## 2024-01-26 DIAGNOSIS — J9611 Chronic respiratory failure with hypoxia: Secondary | ICD-10-CM

## 2024-01-26 NOTE — Progress Notes (Signed)
 Paramedicine Encounter    Patient ID: Jocelyn [name], female    DOB: July 23, 1956, 68 y.o.   MRN: [id number]   Complaints-none   Edema-slight to lower legs  Compliance with meds-yes  Pill box filled-yes  If so, by whom-she filled it   Refills needed-eliquis-she [facility] get [facility]-but may [facility] it to [facility]    Pt reports she is doing good. She is back at her house now pretty much full time. She is participating in pulm rehab, feels exhausted afterwards.  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  She did receive her 02 equipment and supplies. She has home concentrator and portable one. I reviewed all that equip with her as they just dropped it off and didn't explain the use or anything. She will be using this mainly at night time and PRN. Pulm rehab is monitoring her pulse ox during exercise and she denies it dropping.  She does has slight swelling to lower legs. She did eat japanese last night and is having leftovers today. So we talked about sodium restriction and causing increased swelling.  Also advised her to utilize her compression stockings esp when she is on her feet a lot.  No c/p, no dizziness.  She got up to look for something and she got off balance for a moment, she did jump up really fast. But it passed quickly.  Meds verified.  She is going to need refills on eliquis by next week. We talked about the process of getting [facility] or [facility] what she will need to do, but will need to do it asap since she runs out next week.  She has done much better with med compliance.  Will f/u in 2 wks unless needed before then.   BP (!) 118/52   Pulse 78   Resp 16   Wt 149 lb (67.6 kg)   SpO2 98%   BMI 27.25 kg/m  Weight yesterday-146 Last visit weight-146   Patient Care Team: Renaye Rakers, MD as PCP - General (Family Medicine) Corky Crafts,  MD as PCP - Cardiology (Cardiology)  Patient Active Problem List   Diagnosis Date Noted   Physical deconditioning 08/23/2023   Acute respiratory failure with hypoxia (HCC) 08/05/2023   Pulmonary embolism (HCC) 08/03/2023   Pulmonary edema 08/03/2023   Degenerative arthritis of knee 08/20/2021   Hx of pleural effusion    Dyspnea 08/16/2018   Fall on or from sidewalk curb, initial encounter 05/31/2018   History of acute inferior wall MI 08/22/2017   Diabetes mellitus (HCC)    S/P right coronary artery (RCA) stent placement    Overweight 05/05/2017   Diabetes mellitus type 2, controlled, without complications (HCC) 12/16/2016   Asthmatic bronchitis , chronic (HCC) 05/10/2016   Polymyositis (HCC) 09/15/2015   History of sarcoidosis 09/15/2015   Falling 03/13/2015   Edema 02/13/2014   Coronary artery disease involving native heart without angina pectoris 10/29/2013   Hyperlipidemia 10/29/2013   Iron (Fe) deficiency anemia 08/08/2013   Vitamin B 12 deficiency 08/08/2013   GERD (gastroesophageal reflux disease) 09/08/2011   Weakness 02/22/2011   Depression 02/22/2011   MENIERE'S DISEASE 04/30/2009   Vitamin D deficiency 02/05/2009   Venous (peripheral) insufficiency 12/12/2007   RENAL CALCULUS 12/12/2007   DIZZINESS 12/12/2007   HYPERCHOLESTEROLEMIA 09/16/2007   Anxiety state 09/16/2007   HYPERTENSION,  BENIGN 09/16/2007   Allergic rhinitis 09/16/2007   IRRITABLE BOWEL SYNDROME 09/16/2007   Myalgia and myositis 09/16/2007    Current Outpatient Medications:    allopurinol (ZYLOPRIM) 100 MG tablet, Take 1 tablet by mouth daily as needed., Disp: , Rfl:    ALPRAZolam (XANAX) 0.5 MG tablet, Take 0.5 mg by mouth 3 (three) times daily., Disp: , Rfl:    apixaban (ELIQUIS) 5 MG TABS tablet, Take 10 mg by mouth twice daily, then on 08/17/2023-switch to 5 mg by mouth twice daily., Disp: 180 tablet, Rfl: 3   azaTHIOprine (IMURAN) 50 MG tablet, Take 1 tablet by mouth daily., Disp: , Rfl:     Bempedoic Acid (NEXLETOL) 180 MG TABS, TAKE 1 TABLET BY MOUTH DAILY, Disp: 90 tablet, Rfl: 3   Empagliflozin-metFORMIN HCl ER 25-1000 MG TB24, Take 1 tablet by mouth daily., Disp: , Rfl:    GEMTESA 75 MG TABS, Take 1 tablet by mouth daily., Disp: , Rfl:    icosapent Ethyl (VASCEPA) 1 g capsule, TAKE 2 CAPSULES(2 GRAMS) BY MOUTH TWICE DAILY, Disp: 360 capsule, Rfl: 3   metoprolol succinate (TOPROL-XL) 100 MG 24 hr tablet, Take 1 tablet (100 mg total) by mouth daily. Take with or immediately following a meal., Disp: 90 tablet, Rfl: 3   nitroGLYCERIN (NITROSTAT) 0.4 MG SL tablet, Place 1 tablet (0.4 mg total) under the tongue every 5 (five) minutes as needed for chest pain., Disp: 25 tablet, Rfl: 2   ONETOUCH VERIO test strip, CHECK BLOOD SUGAR TID AS DIRECTED, Disp: , Rfl:    pantoprazole (PROTONIX) 40 MG tablet, Take 1 tablet (40 mg total) by mouth daily., Disp: 30 tablet, Rfl: 0   PRALUENT 150 MG/ML SOAJ, ADMINISTER 1 ML UNDER THE SKIN EVERY 14 DAYS, Disp: 6 mL, Rfl: 3   spironolactone (ALDACTONE) 25 MG tablet, TAKE 1 TABLET BY MOUTH EVERY DAY, Disp: 90 tablet, Rfl: 2   benzonatate (TESSALON) 100 MG capsule, Take 1 capsule (100 mg total) by mouth 3 (three) times daily as needed for cough. (Patient not taking: Reported on 01/26/2024), Disp: 30 capsule, Rfl: 1 Allergies  Allergen Reactions   Actos [Pioglitazone] Other (See Comments)    REACTION: pt states INTOL w/ edema   Codeine Other (See Comments)    REACTION: vomiting   Januvia [Sitagliptin] Nausea Only   Lactose Intolerance (Gi) Diarrhea   Morphine Nausea Only and Nausea And Vomiting    REACTION: vomiting REACTION: vomiting   Repatha [Evolocumab]     WAS NOT EFFECTIVE IN LOWERING LDL      Social History   Socioeconomic History   Marital status: Single    Spouse name: Not on file   Number of children: 1   Years of education: Not on file   Highest education level: Master's degree (e.g., MA, MS, MEng, MEd, MSW, MBA)  Occupational  History   Occupation: Retired Runner, broadcasting/film/video   Tobacco Use   Smoking status: Never   Smokeless tobacco: Never   Tobacco comments:    Daily Caffeine - 1  Exercise 2-3 times/weekly  Vaping Use   Vaping status: Never Used  Substance and Sexual Activity   Alcohol use: No    Alcohol/week: 0.0 standard drinks of alcohol   Drug use: No   Sexual activity: Yes    Birth control/protection: None  Other Topics Concern   Not on file  Social History Narrative   Exercises 2-3 times weekly   Caffeine Use: 1 daily (tea or Pepsi)   Lives alone in a  one story home.  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  Retired Midwife.     Social Drivers of Corporate investment banker Strain: Low Risk  (08/05/2023)   Overall Financial Resource Strain (CARDIA)    Difficulty of Paying Living Expenses: Not hard at all  Food Insecurity: No Food Insecurity (08/05/2023)   Hunger Vital Sign    Worried About Running Out of Food in the Last Year: Never true    Ran Out of Food in the Last Year: Never true  Transportation Needs: No Transportation Needs (08/05/2023)   PRAPARE - Administrator, Civil Service (Medical): No    Lack of Transportation (Non-Medical): No  Physical Activity: Not on file  Stress: No Stress Concern Present (08/05/2023)   [employer] of Occupational Health - Occupational Stress Questionnaire    Feeling of Stress : Not at all  Social Connections: Not on file  Intimate Partner Violence: Not At Risk (08/05/2023)   Humiliation, Afraid, Rape, and Kick questionnaire    Fear of Current or Ex-Partner: No    Emotionally Abused: No    Physically Abused: No    Sexually Abused: No    Physical Exam      Future Appointments  Date Time Provider Department Center  01/30/2024  3:00 PM Kathleene Hazel, MD [facility]  01/31/2024 10:15 AM [facility]ERGRAD [facility] None  02/02/2024 10:15 AM [facility] UND[facility]AD [facility] None  02/07/2024 10:15 AM [facility]  UNDERGRAD [facility] None  02/09/2024 10:15 AM [facility] UNDERGRAD [facility] None  02/10/2024  2:30 PM [facility] PHARMACIST [facility]  02/14/2024 10:15 AM [facility] UND[facility]GRAD [facility] None  02/16/2024 10:15 AM [facility] UNDERGRAD [facility] None  02/21/2024 10:15 AM [facility] UNDERGRAD [facility] None  02/23/2024 10:15 AM [facility] UNDERGRAD [facility] None  02/28/2024 10:15 AM [facility] UNDERGRAD [facility] None  03/01/2024 10:15 AM [facility]AD [facility] None  03/06/2024 10:15 AM [facility]AD [facility] None  03/08/2024 10:15 AM [facility] UNDERGRAD [facility] None  03/13/2024 10:15 AM [facility] UNDERGRAD [facility] None  03/15/2024 10:15 AM [facility] UNDERGRAD [facility] None  03/20/2024 10:15 AM [facility] UNDERGRAD [facility] None  03/22/2024 10:15 AM [facility] UNDERGRAD [facility] None  03/27/2024 10:15 AM [facility] UNDERGRAD [facility] None  03/29/2024 10:15 AM [facility] UNDERGRAD [facility] None  06/06/2024  2:00 PM [facility] PA/NP [facility] None  07/30/2024  1:45 PM Windell Norfolk, MD [facility] None       Kerry Hough, Paramedic [phone] [facility] Paramedic  01/26/24

## 2024-01-26 NOTE — Progress Notes (Signed)
 Daily Session Note  Patient Details  Name: Jocelyn [name] MRN: [id number] Date of Birth: [birth year] Referring Provider:   Doristine Devoid Pulmonary Rehab Walk Test from 01/02/2024 in [facility]  Referring Provider Ramaswamy       Encounter Date: 01/26/2024  Check In:  Session Check In - 01/26/24 1116       Check-In   Supervising physician immediately available to respond to emergencies [facility] MD immediately available    Physician(s) Edd Fabian, NP    Location [facility]    Staff Present Durel Salts, Zella Richer, MS, ACSM-CEP, Exercise Physiologist;Randi Dionisio Paschal, ACSM-CEP, Exercise Physiologist;Samantha Belarus, RD, LDN;Johnny Hale Bogus, MS, Exercise Physiologist    Virtual Visit No    Medication changes reported     No    Fall or balance concerns reported    No    Tobacco Cessation No Change    Warm-up and Cool-down Performed as group-led instruction    Resistance Training Performed Yes    VAD Patient? No    PAD/SET Patient? No      Pain Assessment   Currently in Pain? No/denies             Capillary Blood Glucose: No results found for this or any previous visit (from the past 24 hours).    Social History   Tobacco Use  Smoking Status Never  Smokeless Tobacco Never  Tobacco Comments   Daily Caffeine - 1  Exercise 2-3 times/weekly    Goals Met:  Proper associated with RPD/PD & O2 Sat Independence with exercise equipment Exercise tolerated well No report of concerns or symptoms today Strength training completed today  Goals Unmet:  Not Applicable  Comments: Service time is from 1022 to 1135.    Dr. Mechele Collin is Medical Director for Pulmonary Rehab at [facility].

## 2024-01-30 ENCOUNTER — Ambulatory Visit: Payer: Medicare PPO | Attending: Cardiovascular Disease | Admitting: Cardiovascular Disease

## 2024-01-30 ENCOUNTER — Encounter: Payer: Self-pay | Admitting: Cardiovascular Disease

## 2024-01-30 VITALS — BP 130/78 | HR 88 | Ht 62.0 in | Wt 140.0 lb

## 2024-01-30 DIAGNOSIS — I2510 Atherosclerotic heart disease of native coronary artery without angina pectoris: Secondary | ICD-10-CM

## 2024-01-30 DIAGNOSIS — I2699 Other pulmonary embolism without acute cor pulmonale: Secondary | ICD-10-CM | POA: Diagnosis not present

## 2024-01-30 DIAGNOSIS — I10 Essential (primary) hypertension: Secondary | ICD-10-CM

## 2024-01-30 DIAGNOSIS — E782 Mixed hyperlipidemia: Secondary | ICD-10-CM

## 2024-01-30 NOTE — Patient Instructions (Addendum)
Medication Instructions:  No changes *If you need a refill on your cardiac medications before your next appointment, please call your pharmacy*   Lab Work: none If you have labs (blood work) drawn today and your tests are completely normal, you will receive your results only by: MyChart Message (if you have MyChart) OR A paper copy in the mail If you have any lab test that is abnormal or we need to change your treatment, we will call you to review the results.   Testing/Procedures: none   Follow-Up: At [facility], you and your health needs are our priority.  As part of our continuing mission to provide you with exceptional heart care, we have created designated Provider Care Teams.  These Care Teams include your primary Cardiologist (physician) and Advanced Practice Providers (APPs -  Physician Assistants and Nurse Practitioners) who all work together to provide you with the care you need, when you need it.   Your next appointment:   12 month(s)  Provider:   Christopher McAlhany, MD      

## 2024-01-30 NOTE — Progress Notes (Signed)
 Chief Complaint  Patient presents with   Follow-up    CAD   History of Present Illness: 68 yo female with history of CAD, PE, anxiety, HTN, fibromyalgia, HLD, sleep apnea, DM, polymyositis and sarcoidosis who is here today for follow up. She has been followed in our office by Dr. Eldridge Dace. She had in inferior STEMI in 2018 and had a drug eluting stent placed in the distal RCA. Mild disease in the LAD. Nuclear stress test 08/02/23 with no ischemia. She was admitted to [facility] in September 2024 with a saddle pulmonary embolus with PEA arrest and underwent aspiration thrombectomy. Initial echo with reduced LV systolic function but repeat echo with normal LV function prior to discharge. Echo December 2024 with LVEF=60-65%. Mild RV dysfunction. Plans for lifelong anticoagulation. She was negative for antiphospholipid antibody syndrome, Factor V Leiden, and prothrombin gene mutation. She has been followed for RV failure in our Advanced Heart Failure clinic by Dr. Shirlee Latch and has f/u in their clinic in July 2025.   She is here today for follow up. The patient denies any chest pain, dyspnea, palpitations, lower extremity edema, orthopnea, PND, dizziness, near syncope or syncope.   Primary Care Physician: Renaye Rakers, MD   Past Medical History:  Diagnosis Date   Acute bronchitis    Allergic rhinitis, cause unspecified    Anemia    Anxiety    B12 deficiency    BV (bacterial vaginosis) 06/22/1996   Calculus of kidney    Calculus of kidney    Coronary atherosclerosis of unspecified type of vessel, native or graft    Dizziness    Essential hypertension, benign    Fibroid 2003   Fibromyalgia    H/O dysmenorrhea 2008   H/O varicella    Headache(784.0)    frequently   HSV-2 infection 2009   Hyperplastic colon polyp 05/16/2014   Irritable bowel syndrome    Meniere's disease, unspecified    Menses, irregular 2003   Myalgia and myositis, unspecified    Myocardial infarction (HCC)    Obstructive  sleep apnea (adult) (pediatric)    Perimenopausal symptoms 2003   Pure hypercholesterolemia    Sarcoidosis    Type II or unspecified type diabetes mellitus without mention of complication, not stated as uncontrolled    Unspecified venous (peripheral) insufficiency    Vitamin D deficiency    Vulvitis 2010   Yeast infection     Past Surgical History:  Procedure Laterality Date   COLONOSCOPY     CORONARY STENT INTERVENTION N/A 06/15/2017   Procedure: CORONARY STENT INTERVENTION;  Surgeon: Corky Crafts, MD;  [facility];  Service: Cardiovascular;  Laterality: N/A;   heart catherization     IR ANGIOGRAM PULMONARY BILATERAL SELECTIVE  08/04/2023   IR ANGIOGRAM SELECTIVE EACH ADDITIONAL VESSEL  08/04/2023   IR ANGIOGRAM SELECTIVE EACH ADDITIONAL VESSEL  08/04/2023   IR THROMBECT PRIM MECH INIT (INCLU) MOD SED  08/04/2023   IR US GUIDE VASC ACCESS RIGHT  08/04/2023   LEFT HEART CATH AND CORONARY ANGIOGRAPHY N/A 06/15/2017   Procedure: LEFT HEART CATH AND CORONARY ANGIOGRAPHY;  Surgeon: Corky Crafts, MD;  Location[facility];  Service: Cardiovascular;  Laterality: N/A;   RADIOLOGY WITH ANESTHESIA N/A 08/04/2023   Procedure: IR WITH ANESTHESIA;  Surgeon: Julieanne Cotton, MD;  Location: [facility] OR;  Service: Radiology;  Laterality: N/A;   TONSILLECTOMY      Current Outpatient Medications  Medication Sig Dispense Refill   allopurinol (ZYLOPRIM)  100 MG tablet Take 1 tablet by mouth daily as needed.     ALPRAZolam (XANAX) 0.5 MG tablet Take 0.5 mg by mouth 3 (three) times daily.     apixaban (ELIQUIS) 5 MG TABS tablet Take 10 mg by mouth twice daily, then on 08/17/2023-switch to 5 mg by mouth twice daily. 180 tablet 3   azaTHIOprine (IMURAN) 50 MG tablet Take 1 tablet by mouth daily.     Bempedoic Acid (NEXLETOL) 180 MG TABS TAKE 1 TABLET BY MOUTH DAILY 90 tablet 3   Empagliflozin-metFORMIN HCl ER 25-1000 MG TB24 Take 1 tablet by mouth daily.     GEMTESA 75 MG TABS  Take 1 tablet by mouth daily.     icosapent Ethyl (VASCEPA) 1 g capsule TAKE 2 CAPSULES(2 GRAMS) BY MOUTH TWICE DAILY 360 capsule 3   metoprolol succinate (TOPROL-XL) 100 MG 24 hr tablet Take 1 tablet (100 mg total) by mouth daily. Take with or immediately following a meal. 90 tablet 3   nitroGLYCERIN (NITROSTAT) 0.4 MG SL tablet Place 1 tablet (0.4 mg total) under the tongue every 5 (five) minutes as needed for chest pain. 25 tablet 2   ONETOUCH VERIO test strip CHECK BLOOD SUGAR TID AS DIRECTED     PRALUENT 150 MG/ML SOAJ ADMINISTER 1 ML UNDER THE SKIN EVERY 14 DAYS 6 mL 3   spironolactone (ALDACTONE) 25 MG tablet TAKE 1 TABLET BY MOUTH EVERY DAY 90 tablet 2   pantoprazole (PROTONIX) 40 MG tablet Take 1 tablet (40 mg total) by mouth daily. (Patient not taking: Reported on 01/30/2024) 30 tablet 0   No current facility-administered medications for this visit.    Allergies  Allergen Reactions   Actos [Pioglitazone] Other (See Comments)    REACTION: pt states INTOL w/ edema   Codeine Other (See Comments)    REACTION: vomiting   Januvia [Sitagliptin] Nausea Only   Lactose Intolerance (Gi) Diarrhea   Morphine Nausea Only and Nausea And Vomiting    REACTION: vomiting REACTION: vomiting   Repatha [Evolocumab]     WAS NOT EFFECTIVE IN LOWERING LDL    Social History   Socioeconomic History   Marital status: Single    Spouse name: Not on file   Number of children: 1   Years of education: Not on file   Highest education level: Master's degree (e.g., MA, MS, MEng, MEd, MSW, MBA)  Occupational History   Occupation: Retired Runner, broadcasting/film/video   Tobacco Use   Smoking status: Never   Smokeless tobacco: Never   Tobacco comments:    Daily Caffeine - 1  Exercise 2-3 times/weekly  Vaping Use   Vaping status: Never Used  Substance and Sexual Activity   Alcohol use: No    Alcohol/week: 0.0 standard drinks of alcohol   Drug use: No   Sexual activity: Yes    Birth control/protection: None  Other  Topics Concern   Not on file  Social History Narrative   Exercises 2-3 times weekly   Caffeine Use: 1 daily (tea or Pepsi)   Lives alone in a one story home.  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  Retired Midwife.     Social Drivers of Corporate investment banker Strain: Low Risk  (08/05/2023)   Overall Financial Resource Strain (CARDIA)    Difficulty of Paying Living Expenses: Not hard at all  Food Insecurity: No Food Insecurity (08/05/2023)   Hunger Vital Sign    Worried About Running Out of Food in the Last Year: Never true  Ran Out of Food in the Last Year: Never true  Transportation Needs: No Transportation Needs (08/05/2023)   PRAPARE - Administrator, Civil Service (Medical): No    Lack of Transportation (Non-Medical): No  Physical Activity: Not on file  Stress: No Stress Concern Present (08/05/2023)   [employer] of Occupational Health - Occupational Stress Questionnaire    Feeling of Stress : Not at all  Social Connections: Not on file  Intimate Partner Violence: Not At Risk (08/05/2023)   Humiliation, Afraid, Rape, and Kick questionnaire    Fear of Current or Ex-Partner: No    Emotionally Abused: No    Physically Abused: No    Sexually Abused: No    Family History  Adopted: Yes  Problem Relation Age of Onset   Other Other        ADOPTED   Colon cancer Neg Hx    Esophageal cancer Neg Hx    Rectal cancer Neg Hx    Stomach cancer Neg Hx     Review of Systems:  As stated in the HPI and otherwise negative.   BP 130/78   Pulse 88   Ht 5\' 2"  (1.575 m)   Wt 63.5 kg   SpO2 97%   BMI 25.61 kg/m   Physical Examination: General: Well developed, well nourished, NAD  HEENT: OP clear, mucus membranes moist  SKIN: warm, dry. No rashes. Neuro: No focal deficits  Musculoskeletal: Muscle strength 5/5 all ext  Psychiatric: Mood and affect normal  Neck: No JVD, no carotid bruits, no thyromegaly, no lymphadenopathy.  Lungs:Clear bilaterally, no wheezes,  rhonci, crackles Cardiovascular: Regular rate and rhythm. No murmurs, gallops or rubs. Abdomen:Soft. Bowel sounds present. Non-tender.  Extremities: No lower extremity edema. Pulses are 2 + in the bilateral DP/PT.  EKG:  EKG is not ordered today. The ekg ordered today demonstrates   Recent Labs: 08/10/2023: ALT 36; Magnesium 1.9 12/08/2023: B Natriuretic Peptide 19.2; BUN 27; Creatinine, Ser 1.39; Hemoglobin 11.9; Platelets 407; Potassium 4.9; Sodium 136   Lipid Panel    Component Value Date/Time   CHOL 217 (H) 12/08/2023 1453   CHOL 205 (H) 12/16/2022 1608   TRIG 245 (H) 12/08/2023 1453   HDL 27 (L) 12/08/2023 1453   HDL 38 (L) 12/16/2022 1608   CHOLHDL 8.0 12/08/2023 1453   VLDL 49 (H) 12/08/2023 1453   LDLCALC 141 (H) 12/08/2023 1453   LDLCALC 135 (H) 12/16/2022 1608   LDLDIRECT 137 (H) 12/16/2022 1608   LDLDIRECT 91.6 08/14/2014 0822     Wt Readings from Last 3 Encounters:  01/30/24 63.5 kg  01/26/24 67.6 kg  01/17/24 67.9 kg    Assessment and Plan:   1. CAD without angina: No chest pain. She is statin intolerant. Continue beta blocker and Praluent  2. Pulmonary embolism with RV failure: Mild RV dysfunction by echo in December 2024. Followed in the Advanced Heart Failure clinic by Dr. Shirlee Latch. Continue Eliquis, aldactone and Jardiance.   3. Hyperlipidemia: She is statin intolerant. She is now on Praluent, Nexletol, Vascepa. LDL 141 in January 2024. She has a previously arranged appt in the lipid clinic this week. Continue Praluent, Nexletol and Vascepa.   4. HTN: BP is well controlled. Continue beta blocker and aldactone  Labs/ tests ordered today include:  No orders of the defined types were placed in this encounter.  Disposition:   F/U with me in 12 months   Signed, Verne Carrow, MD, [facility] 01/30/2024 3:55 PM    [facility] [address] [place], [place], [place]  [address] Phone: [phone]; Fax: [phone]

## 2024-01-31 ENCOUNTER — Encounter (HOSPITAL_COMMUNITY)
Admission: RE | Admit: 2024-01-31 | Discharge: 2024-01-31 | Disposition: A | Discharge status: Home / Self Care | Payer: Medicare PPO | Source: Ambulatory Visit | Attending: Internal Medicine | Admitting: Internal Medicine

## 2024-01-31 ENCOUNTER — Other Ambulatory Visit (HOSPITAL_COMMUNITY): Payer: Self-pay

## 2024-01-31 VITALS — Wt 150.1 lb

## 2024-01-31 DIAGNOSIS — J9611 Chronic respiratory failure with hypoxia: Secondary | ICD-10-CM

## 2024-01-31 DIAGNOSIS — I2699 Other pulmonary embolism without acute cor pulmonale: Secondary | ICD-10-CM | POA: Diagnosis not present

## 2024-01-31 NOTE — Progress Notes (Signed)
 Daily Session Note  Patient Details  Name: Jocelyn [name] MRN: [id number] Date of Birth: Sep 24, 1956 Referring Provider:   Doristine Devoid Pulmonary Rehab Walk Test from 01/02/2024 in [facility]  Referring Provider Ramaswamy       Encounter Date: 01/31/2024  Check In:  Session Check In - 01/31/24 1101       Check-In   Supervising physician immediately available to respond to emergencies [facility] MD immediately available    Physician(s) Edd Fabian, NP    Location [facility]    Staff Present Durel Salts, Zella Richer, MS, ACSM-CEP, Exercise Physiologist;Randi Dionisio Paschal, ACSM-CEP, Exercise Physiologist;Samantha Belarus, RD, [place] Gray, RN, BSN    Virtual Visit No    Medication changes reported     No    Fall or balance concerns reported    No    Tobacco Cessation No Change    Warm-up and Cool-down Performed as group-led Writer Performed Yes    VAD Patient? No      Pain Assessment   Currently in Pain? No/denies             Capillary Blood Glucose: No results found for this or any previous visit (from the past 24 hours).   Exercise Prescription Changes - 01/31/24 1100       Response to Exercise   Blood Pressure (Admit) 112/58    Blood Pressure (Exercise) 102/60    Blood Pressure (Exit) 106/58    Heart Rate (Admit) 84 bpm    Heart Rate (Exercise) 89 bpm    Heart Rate (Exit) 74 bpm    Oxygen Saturation (Admit) 99 %    Oxygen Saturation (Exercise) 98 %    Oxygen Saturation (Exit) 97 %    Rating of Perceived Exertion (Exercise) 14    Perceived Dyspnea (Exercise) 2    Duration Continue with 30 min of aerobic exercise without signs/symptoms of physical distress.    Intensity THRR unchanged      Progression   Progression Continue to progress workloads to maintain intensity without signs/symptoms of physical distress.      Resistance Training   Training  Prescription Yes    Weight yellow bands    Reps 10-15    Time 10 Minutes      Recumbant Bike   Level 2    RPM 42    Watts 11    Minutes 15    METs 1.8      NuStep   Level 3    SPM 69    Minutes 15    METs 1.8             Social History   Tobacco Use  Smoking Status Never  Smokeless Tobacco Never  Tobacco Comments   Daily Caffeine - 1  Exercise 2-3 times/weekly    Goals Met:  Proper associated with RPD/PD & O2 Sat Independence with exercise equipment Exercise tolerated well No report of concerns or symptoms today Strength training completed today  Goals Unmet:  Not Applicable  Comments:  Service time is from 1026 to 1130.     Dr. Mechele Collin is Medical Director for Pulmonary Rehab at [facility].

## 2024-02-01 NOTE — Progress Notes (Signed)
 Pulmonary Individual Treatment Plan  Patient Details  Name: Jocelyn [name] MRN: [id number] Date of Birth: [birth year] Referring Provider:   Doristine Devoid Pulmonary Rehab Walk Test from 01/02/2024 in [facility]  Referring Provider Ramaswamy       Initial Encounter Date:  Flowsheet Row Pulmonary Rehab Walk Test from 01/02/2024 in [facility]  Date 01/02/24       Visit Diagnosis: Chronic respiratory failure with hypoxia (HCC)  Pulmonary embolism, unspecified chronicity, unspecified pulmonary embolism type, unspecified whether acute cor pulmonale present (HCC)  Patient's Home Medications on Admission:   Current Outpatient Medications:    allopurinol (ZYLOPRIM) 100 MG tablet, Take 1 tablet by mouth daily as needed., Disp: , Rfl:    ALPRAZolam (XANAX) 0.5 MG tablet, Take 0.5 mg by mouth 3 (three) times daily., Disp: , Rfl:    apixaban (ELIQUIS) 5 MG TABS tablet, Take 10 mg by mouth twice daily, then on 08/17/2023-switch to 5 mg by mouth twice daily., Disp: 180 tablet, Rfl: 3   azaTHIOprine (IMURAN) 50 MG tablet, Take 1 tablet by mouth daily., Disp: , Rfl:    Bempedoic Acid (NEXLETOL) 180 MG TABS, TAKE 1 TABLET BY MOUTH DAILY, Disp: 90 tablet, Rfl: 3   Empagliflozin-metFORMIN HCl ER 25-1000 MG TB24, Take 1 tablet by mouth daily., Disp: , Rfl:    GEMTESA 75 MG TABS, Take 1 tablet by mouth daily., Disp: , Rfl:    icosapent Ethyl (VASCEPA) 1 g capsule, TAKE 2 CAPSULES(2 GRAMS) BY MOUTH TWICE DAILY, Disp: 360 capsule, Rfl: 3   metoprolol succinate (TOPROL-XL) 100 MG 24 hr tablet, Take 1 tablet (100 mg total) by mouth daily. Take with or immediately following a meal., Disp: 90 tablet, Rfl: 3   nitroGLYCERIN (NITROSTAT) 0.4 MG SL tablet, Place 1 tablet (0.4 mg total) under the tongue every 5 (five) minutes as needed for chest pain., Disp: 25 tablet, Rfl: 2   ONETOUCH VERIO test  strip, CHECK BLOOD SUGAR TID AS DIRECTED, Disp: , Rfl:    pantoprazole (PROTONIX) 40 MG tablet, Take 1 tablet (40 mg total) by mouth daily. (Patient not taking: Reported on 01/30/2024), Disp: 30 tablet, Rfl: 0   PRALUENT 150 MG/ML SOAJ, ADMINISTER 1 ML UNDER THE SKIN EVERY 14 DAYS, Disp: 6 mL, Rfl: 3   spironolactone (ALDACTONE) 25 MG tablet, TAKE 1 TABLET BY MOUTH EVERY DAY, Disp: 90 tablet, Rfl: 2  Past Medical History: Past Medical History:  Diagnosis Date   Acute bronchitis    Allergic rhinitis, cause unspecified    Anemia    Anxiety    B12 deficiency    BV (bacterial vaginosis) 06/22/1996   Calculus of kidney    Calculus of kidney    Coronary atherosclerosis of unspecified type of vessel, native or graft    Dizziness    Essential hypertension, benign    Fibroid 2003   Fibromyalgia    H/O dysmenorrhea 2008   H/O varicella    Headache(784.0)    frequently   HSV-2 infection 2009   Hyperplastic colon polyp 05/16/2014   Irritable bowel syndrome    Meniere's disease, unspecified    Menses, irregular 2003   Myalgia and myositis, unspecified    Myocardial infarction (HCC)    Obstructive sleep apnea (adult) (pediatric)    Perimenopausal symptoms 2003   Pure hypercholesterolemia    Sarcoidosis    Type II or unspecified type diabetes mellitus without  mention of complication, not stated as uncontrolled    Unspecified venous (peripheral) insufficiency    Vitamin D deficiency    Vulvitis 2010   Yeast infection     Tobacco Use: Social History   Tobacco Use  Smoking Status Never  Smokeless Tobacco Never  Tobacco Comments   Daily Caffeine - 1  Exercise 2-3 times/weekly    Labs: Review Flowsheet  More data exists      Latest Ref Rng & Units 08/04/2023 08/05/2023 08/06/2023 08/07/2023 12/08/2023  Labs for ITP Cardiac and Pulmonary Rehab  Cholestrol 0 - 200 mg/dL - - - - 725   LDL (calc) 0 - 99 mg/dL - - - - 366   HDL-C >44 mg/dL - - - - 27   Trlycerides <150 mg/dL - - - - 034    PH, Arterial 7.35 - 7.45 7.494  7.184  7.105  6.837  7.445  - - -  PCO2 arterial 32 - 48 mmHg 27.7  20.6  28.2  38.6  36.1  - - -  Bicarbonate 20.0 - 28.0 mmol/L 21.2  8.1  8.9  6.5  24.7  - - -  TCO2 22 - 32 mmol/L 22  9  10  8  26   - - -  Acid-base deficit 0.0 - 2.0 mmol/L 1.0  19.0  19.0  26.0  - - - -  O2 Saturation % 66.7  57.8  100  99  99  96  99  69.2  70.6  79.7  -    Details       Multiple values from one day are sorted in reverse-chronological order         Capillary Blood Glucose: Lab Results  Component Value Date   GLUCAP 104 (H) 01/12/2024   GLUCAP 110 (H) 01/12/2024   GLUCAP 90 01/10/2024   GLUCAP 98 01/10/2024   GLUCAP 115 (H) 08/11/2023     Pulmonary Assessment Scores:  Pulmonary Assessment Scores     Row Name 01/02/24 1423         ADL UCSD   ADL Phase Entry     SOB Score total 39       CAT Score   CAT Score 16       mMRC Score   mMRC Score 3             UCSD: Self-administered rating of dyspnea associated with activities of daily living (ADLs) 6-point scale (0 = "not at all" to 5 = "maximal or unable to do because of breathlessness")  Scoring Scores range from 0 to 120.  Minimally important difference is 5 units  CAT: CAT can identify the health impairment of COPD patients and is better correlated with disease progression.  CAT has a scoring range of zero to 40. The CAT score is classified into four groups of low (less than 10), medium (10 - 20), high (21-30) and very high (31-40) based on the impact level of disease on health status. A CAT score over 10 suggests significant symptoms.  A worsening CAT score could be explained by an exacerbation, poor medication adherence, poor inhaler technique, or progression of COPD or comorbid conditions.  CAT MCID is 2 points  mMRC: mMRC (Modified Medical Research Council) Dyspnea Scale is used to assess the degree of baseline functional disability in patients of respiratory disease due to  dyspnea. No minimal important difference is established. A decrease in score of 1 point or greater is considered a positive change.  Pulmonary Function Assessment:  Pulmonary Function Assessment - 01/02/24 1338       Breath   Bilateral Breath Sounds Clear    Shortness of Breath Yes;Fear of Shortness of Breath;Limiting activity             Exercise Target Goals: Exercise Program Goal: Individual exercise prescription set using results from initial 6 min walk test and THRR while considering  patient's activity barriers and safety.   Exercise Prescription Goal: Initial exercise prescription builds to 30-45 minutes a day of aerobic activity, 2-3 days per week.  Home exercise guidelines will be given to patient during program as part of exercise prescription that the participant will acknowledge.  Activity Barriers & Risk Stratification:  Activity Barriers & Cardiac Risk Stratification - 01/02/24 1339       Activity Barriers & Cardiac Risk Stratification   Activity Barriers Joint Problems;History of Falls;Deconditioning;Muscular Weakness;Shortness of Breath             6 Minute Walk:  6 Minute Walk     Row Name 01/02/24 1507         6 Minute Walk   Phase Initial     Distance 1060 feet     Walk Time 6 minutes     # of Rest Breaks 2  brief rest 1:57-2:10, 4:42-4:50     MPH 2.01     METS 2.39     RPE 12     Perceived Dyspnea  1     VO2 Peak 8.35     Symptoms Yes (comment)     Comments fatigue     Resting HR 60 bpm     Resting BP 122/64     Resting Oxygen Saturation  100 %     Exercise Oxygen Saturation  during 6 min walk 100 %     Max Ex. HR 89 bpm     Max Ex. BP 136/60     2 Minute Post BP 150/70       Interval HR   1 Minute HR 83     2 Minute HR 89     3 Minute HR 84     4 Minute HR 89     5 Minute HR 84     6 Minute HR 86     2 Minute Post HR 59     Interval Heart Rate? Yes       Interval Oxygen   Interval Oxygen? Yes     Baseline Oxygen  Saturation % 100 %     1 Minute Oxygen Saturation % 100 %     1 Minute Liters of Oxygen 0 L     2 Minute Oxygen Saturation % 100 %     2 Minute Liters of Oxygen 0 L     3 Minute Oxygen Saturation % 100 %     3 Minute Liters of Oxygen 0 L     4 Minute Oxygen Saturation % 100 %     4 Minute Liters of Oxygen 0 L     5 Minute Oxygen Saturation % 100 %     5 Minute Liters of Oxygen 0 L     6 Minute Oxygen Saturation % 100 %     6 Minute Liters of Oxygen 0 L     2 Minute Post Oxygen Saturation % 100 %     2 Minute Post Liters of Oxygen 0 L  Oxygen Initial Assessment:  Oxygen Initial Assessment - 01/02/24 1337       Home Oxygen   Home Oxygen Device None    Sleep Oxygen Prescription None    Home Exercise Oxygen Prescription None    Home Resting Oxygen Prescription None      Initial 6 min Walk   Oxygen Used None      Program Oxygen Prescription   Program Oxygen Prescription None      Intervention   Short Term Goals To learn and understand importance of maintaining oxygen saturations>88%;To learn and understand importance of monitoring SPO2 with pulse oximeter and demonstrate accurate use of the pulse oximeter.;To learn and demonstrate proper pursed lip breathing techniques or other breathing techniques.     Long  Term Goals Exhibits proper breathing techniques, such as pursed lip breathing or other method taught during program session;Verbalizes importance of monitoring SPO2 with pulse oximeter and return demonstration;Maintenance of O2 saturations>88%             Oxygen Re-Evaluation:  Oxygen Re-Evaluation     Row Name 01/30/24 0909             Program Oxygen Prescription   Program Oxygen Prescription None         Home Oxygen   Home Oxygen Device None       Sleep Oxygen Prescription None       Home Exercise Oxygen Prescription None       Home Resting Oxygen Prescription None         Goals/Expected Outcomes   Short Term Goals To learn and  understand importance of maintaining oxygen saturations>88%;To learn and understand importance of monitoring SPO2 with pulse oximeter and demonstrate accurate use of the pulse oximeter.;To learn and demonstrate proper pursed lip breathing techniques or other breathing techniques.        Long  Term Goals Exhibits proper breathing techniques, such as pursed lip breathing or other method taught during program session;Verbalizes importance of monitoring SPO2 with pulse oximeter and return demonstration;Maintenance of O2 saturations>88%       Goals/Expected Outcomes Compliance and understanding of oxygen saturations monitoring and breathing techniques to decrease shortness of breath.                Oxygen Discharge (Final Oxygen Re-Evaluation):  Oxygen Re-Evaluation - 01/30/24 0909       Program Oxygen Prescription   Program Oxygen Prescription None      Home Oxygen   Home Oxygen Device None    Sleep Oxygen Prescription None    Home Exercise Oxygen Prescription None    Home Resting Oxygen Prescription None      Goals/Expected Outcomes   Short Term Goals To learn and understand importance of maintaining oxygen saturations>88%;To learn and understand importance of monitoring SPO2 with pulse oximeter and demonstrate accurate use of the pulse oximeter.;To learn and demonstrate proper pursed lip breathing techniques or other breathing techniques.     Long  Term Goals Exhibits proper breathing techniques, such as pursed lip breathing or other method taught during program session;Verbalizes importance of monitoring SPO2 with pulse oximeter and return demonstration;Maintenance of O2 saturations>88%    Goals/Expected Outcomes Compliance and understanding of oxygen saturations monitoring and breathing techniques to decrease shortness of breath.             Initial Exercise Prescription:  Initial Exercise Prescription - 01/02/24 1500       Date of Initial Exercise RX and Referring Provider    Date 01/02/24  Referring Provider Ramaswamy    Expected Discharge Date 03/29/24      NuStep   Level 1    SPM 60    Minutes 15    METs 2      Recumbant Elliptical   Level 1    RPM 60    Minutes 15    METs 2      Prescription Details   Frequency (times per week) 2    Duration Progress to 30 minutes of continuous aerobic without signs/symptoms of physical distress      Intensity   THRR 40-80% of Max Heartrate 60-121    Ratings of Perceived Exertion 11-13    Perceived Dyspnea 0-4      Progression   Progression Continue to progress workloads to maintain intensity without signs/symptoms of physical distress.      Resistance Training   Training Prescription Yes    Weight yellow bands    Reps 10-15             Perform Capillary Blood Glucose checks as needed.  Exercise Prescription Changes:   Exercise Prescription Changes     Row Name 01/17/24 1100 01/31/24 1100           Response to Exercise   Blood Pressure (Admit) 110/74 112/58      Blood Pressure (Exercise) 130/70 102/60      Blood Pressure (Exit) 108/60 106/58      Heart Rate (Admit) 84 bpm 84 bpm      Heart Rate (Exercise) 84 bpm 89 bpm      Heart Rate (Exit) 76 bpm 74 bpm      Oxygen Saturation (Admit) 96 % 99 %      Oxygen Saturation (Exercise) 96 % 98 %      Oxygen Saturation (Exit) 97 % 97 %      Rating of Perceived Exertion (Exercise) 14 14      Perceived Dyspnea (Exercise) 2 2      Duration Continue with 30 min of aerobic exercise without signs/symptoms of physical distress. Continue with 30 min of aerobic exercise without signs/symptoms of physical distress.      Intensity THRR unchanged THRR unchanged        Progression   Progression Continue to progress workloads to maintain intensity without signs/symptoms of physical distress. Continue to progress workloads to maintain intensity without signs/symptoms of physical distress.        Resistance Training   Training Prescription Yes Yes       Weight yellow bands yellow bands      Reps 10-15 10-15      Time 10 Minutes 10 Minutes        Recumbant Bike   Level 2 2      RPM -- 42      Watts -- 11      Minutes 15 15      METs 1.7 1.8        NuStep   Level 2 3      SPM 67 69      Minutes 15 15      METs 1.6 1.8               Exercise Comments:   Exercise Comments     Row Name 01/10/24 1515           Exercise Comments Kynzlee completed first day of exercise. She exercised for 15 min on the recumbent bike and Nustep. Ailah averaged 1.2 METs at level  1 on the recumbent bike and 1.3 METs at level 1 on the Nustep. She performed the warmup and cooldown standing with a few verbal cues. Discussed METs.                Exercise Goals and Review:   Exercise Goals     Row Name 01/02/24 1339             Exercise Goals   Increase Physical Activity Yes       Intervention Provide advice, education, support and counseling about physical activity/exercise needs.;Develop an individualized exercise prescription for aerobic and resistive training based on initial evaluation findings, risk stratification, comorbidities and participant's personal goals.       Expected Outcomes Short Term: Attend rehab on a regular basis to increase amount of physical activity.;Long Term: Exercising regularly at least 3-5 days a week.;Long Term: Add in home exercise to make exercise part of routine and to increase amount of physical activity.       Increase Strength and Stamina Yes       Intervention Provide advice, education, support and counseling about physical activity/exercise needs.;Develop an individualized exercise prescription for aerobic and resistive training based on initial evaluation findings, risk stratification, comorbidities and participant's personal goals.       Expected Outcomes Short Term: Increase workloads from initial exercise prescription for resistance, speed, and METs.;Short Term: Perform resistance training exercises  routinely during rehab and add in resistance training at home;Long Term: Improve cardiorespiratory fitness, muscular endurance and strength as measured by increased METs and functional capacity ( )       Able to understand and use rate of perceived exertion (RPE) scale Yes       Intervention Provide education and explanation on how to use RPE scale       Expected Outcomes Short Term: Able to use RPE daily in rehab to express subjective intensity level;Long Term:  Able to use RPE to guide intensity level when exercising independently       Able to understand and use Dyspnea scale Yes       Intervention Provide education and explanation on how to use Dyspnea scale       Expected Outcomes Short Term: Able to use Dyspnea scale daily in rehab to express subjective sense of shortness of breath during exertion;Long Term: Able to use Dyspnea scale to guide intensity level when exercising independently       Knowledge and understanding of Target Heart Rate Range (THRR) Yes       Intervention Provide education and explanation of THRR including how the numbers were predicted and where they are located for reference       Expected Outcomes Short Term: Able to state/look up THRR;Short Term: Able to use daily as guideline for intensity in rehab;Long Term: Able to use THRR to govern intensity when exercising independently       Understanding of Exercise Prescription Yes       Intervention Provide education, explanation, and written materials on patient's individual exercise prescription       Expected Outcomes Short Term: Able to explain program exercise prescription;Long Term: Able to explain home exercise prescription to exercise independently                Exercise Goals Re-Evaluation :  Exercise Goals Re-Evaluation     Row Name 01/03/24 0746 01/30/24 0906           Exercise Goal Re-Evaluation   Exercise Goals Review Increase Physical Activity;Able to  understand and use Dyspnea  scale;Understanding of Exercise Prescription;Increase Strength and Stamina;Knowledge and understanding of Target Heart Rate Range (THRR);Able to understand and use rate of perceived exertion (RPE) scale Increase Physical Activity;Able to understand and use Dyspnea scale;Understanding of Exercise Prescription;Increase Strength and Stamina;Knowledge and understanding of Target Heart Rate Range (THRR);Able to understand and use rate of perceived exertion (RPE) scale      Comments Pt to begin exercise 01/10/24. Will monitor for progressions. Tonja has completed 6 exercise sessions. She exercises for 15 min on the recumbent bike and Nustep. Vernia averages 2.0 METs at level 2 on the recumbent bike and 1.7 METs at level 2 on the Nustep. Kodi has increased her level for both exericse modes as METs have slightly increased. Will continue to monitor and progress as able.      Expected Outcomes Through exercise at rehab and home, the patient will decrease shortness of breath with daily activities and feel confident in carrying out an exercise regimen at home Through exercise at rehab and home, the patient will decrease shortness of breath with daily activities and feel confident in carrying out an exercise regimen at home               Discharge Exercise Prescription (Final Exercise Prescription Changes):  Exercise Prescription Changes - 01/31/24 1100       Response to Exercise   Blood Pressure (Admit) 112/58    Blood Pressure (Exercise) 102/60    Blood Pressure (Exit) 106/58    Heart Rate (Admit) 84 bpm    Heart Rate (Exercise) 89 bpm    Heart Rate (Exit) 74 bpm    Oxygen Saturation (Admit) 99 %    Oxygen Saturation (Exercise) 98 %    Oxygen Saturation (Exit) 97 %    Rating of Perceived Exertion (Exercise) 14    Perceived Dyspnea (Exercise) 2    Duration Continue with 30 min of aerobic exercise without signs/symptoms of physical distress.    Intensity THRR unchanged      Progression    Progression Continue to progress workloads to maintain intensity without signs/symptoms of physical distress.      Resistance Training   Training Prescription Yes    Weight yellow bands    Reps 10-15    Time 10 Minutes      Recumbant Bike   Level 2    RPM 42    Watts 11    Minutes 15    METs 1.8      NuStep   Level 3    SPM 69    Minutes 15    METs 1.8             Nutrition:  Target Goals: Understanding of nutrition guidelines, daily intake of sodium 1500mg , cholesterol 200mg , calories 30% from fat and 7% or less from saturated fats, daily to have 5 or more servings of fruits and vegetables.  Biometrics:  Pre Biometrics - 01/02/24 1507       Pre Biometrics   Grip Strength 11 kg              Nutrition Therapy Plan and Nutrition Goals:  Nutrition Therapy & Goals - 01/25/24 1050       Nutrition Therapy   Diet Heart Healthy diet      Personal Nutrition Goals   Nutrition Goal Patient to improve diet quality by using the plate method as a guide for meal planning to include lean protein/plant protein, fruits, vegetables, whole grains, nonfat dairy as  part of a well-balanced diet.    Comments Goals in progress. Bevelyn has medical history of sarcoidosis, CAD, DM2, pulmonary embolism, hyperlipidemia. She continues regular follow-up with cardiology; lipid panel is not at goal. 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 A1c remains well controlled. She has maintained her weight since starting with our program. Patient will benefit from participation in pulmonary rehab for nutrition, exercise, and lifestyle modification.      Intervention Plan   Intervention Prescribe, educate and counsel regarding individualized specific dietary modifications aiming towards targeted core components such as weight, hypertension, lipid management, diabetes, heart failure and other comorbidities.;Nutrition handout(s) given to patient.    Expected  Outcomes Short Term Goal: Understand basic principles of dietary content, such as calories, fat, sodium, cholesterol and nutrients.;Long Term Goal: Adherence to prescribed nutrition plan.             Nutrition Assessments:  MEDIFICTS Score Key: >=70 Need to make dietary changes  40-70 Heart Healthy Diet <= 40 Therapeutic Level Cholesterol Diet   Picture Your Plate Scores: <13 Unhealthy dietary pattern with much room for improvement. 41-50 Dietary pattern unlikely to meet recommendations for good health and room for improvement. 51-60 More healthful dietary pattern, with some room for improvement.  >60 Healthy dietary pattern, although there may be some specific behaviors that could be improved.    Nutrition Goals Re-Evaluation:  Nutrition Goals Re-Evaluation     Row Name 01/25/24 1050             Goals   Current Weight 148 lb 13 oz (67.5 kg)       Comment cholesterol 217, triglycerides 245, HDL 27, LDL 141       Expected Outcome Goals in progress. Justyce has medical history of sarcoidosis, CAD, DM2, pulmonary embolism, hyperlipidemia. She continues regular follow-up with cardiology; lipid panel is not at goal. 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 A1c remains well controlled. She has maintained her weight since starting with our program. Patient will benefit from participation in pulmonary rehab for nutrition, exercise, and lifestyle modification.                Nutrition Goals Discharge (Final Nutrition Goals Re-Evaluation):  Nutrition Goals Re-Evaluation - 01/25/24 1050       Goals   Current Weight 148 lb 13 oz (67.5 kg)    Comment cholesterol 217, triglycerides 245, HDL 27, LDL 141    Expected Outcome Goals in progress. Ivania has medical history of sarcoidosis, CAD, DM2, pulmonary embolism, hyperlipidemia. She continues regular follow-up with cardiology; lipid panel is not at goal. 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 A1c remains well controlled. She has maintained her weight since starting with our program. Patient will benefit from participation in pulmonary rehab for nutrition, exercise, and lifestyle modification.             Psychosocial: Target Goals: Acknowledge presence or absence of significant depression and/or stress, maximize coping skills, provide positive support system. Participant is able to verbalize types and ability to use techniques and skills needed for reducing stress and depression.  Initial Review & Psychosocial Screening:  Initial Psych Review & Screening - 01/02/24 1334       Initial Review   Current issues with None Identified      Family Dynamics   Good Support System? Yes      Barriers   Psychosocial barriers to participate  in program There are no identifiable barriers or psychosocial needs.      Screening Interventions   Interventions Encouraged to exercise    Expected Outcomes Short Term goal: Utilizing psychosocial counselor, staff and physician to assist with identification of specific Stressors or current issues interfering with healing process. Setting desired goal for each stressor or current issue identified.;Long Term Goal: Stressors or current issues are controlled or eliminated.;Short Term goal: Identification and review with participant of any Quality of Life or Depression concerns found by scoring the questionnaire.;Long Term goal: The participant improves quality of Life and PHQ9 Scores as seen by post scores and/or verbalization of changes             Quality of Life Scores:  Scores of 19 and below usually indicate a poorer quality of life in these areas.  A difference of  2-3 points is a clinically meaningful difference.  A difference of 2-3 points in the total score of the Quality of Life Index has been associated with significant improvement in overall quality of life, self-image, physical  symptoms, and general health in studies assessing change in quality of life.  PHQ-9: Review Flowsheet  More data may exist      01/02/2024 09/03/2020 10/25/2017 07/20/2017 12/03/2014  Depression screen PHQ 2/9  Decreased Interest 0 0 0 0 0  Down, Depressed, Hopeless 0 0 0 0 0  PHQ - 2 Score 0 0 0 0 0  Altered sleeping 1 - - - -  Tired, decreased energy 0 - - - -  Change in appetite 0 - - - -  Feeling bad or failure about yourself  0 - - - -  Trouble concentrating 0 - - - -  Moving slowly or fidgety/restless 0 - - - -  Suicidal thoughts 0 - - - -  PHQ-9 Score 1 - - - -  Difficult doing work/chores Not difficult at all - - - -   Interpretation of Total Score  Total Score Depression Severity:  1-4 = Minimal depression, 5-9 = Mild depression, 10-14 = Moderate depression, 15-19 = Moderately severe depression, 20-27 = Severe depression   Psychosocial Evaluation and Intervention:  Psychosocial Evaluation - 01/02/24 1335       Psychosocial Evaluation & Interventions   Interventions Encouraged to exercise with the program and follow exercise prescription    Comments Aevah denies any psychosocial needs at this time    Expected Outcomes Dalton will be free of any psy/soc barriers or concerns and will exhibit positive outlook with good coping skills.    Continue Psychosocial Services  No Follow up required             Psychosocial Re-Evaluation:  Psychosocial Re-Evaluation     Row Name 01/03/24 1610 01/25/24 1114           Psychosocial Re-Evaluation   Current issues with None Identified None Identified      Comments No changes since orientation. Erleen is scheduled to start the program on 01/10/24 Ranie denies any new psychosocial barriers or concerns at this time.      Expected Outcomes Dilara will exhibit positive outlook wtih good coping skills and be free of any psy/soc barriers or concerns For Kimala to participate in PR free of any psychosocial barriers or concerns       Interventions Encouraged to attend Pulmonary Rehabilitation for the exercise Encouraged to attend Pulmonary Rehabilitation for the exercise      Continue Psychosocial Services  No Follow up required  No Follow up required               Psychosocial Discharge (Final Psychosocial Re-Evaluation):  Psychosocial Re-Evaluation - 01/25/24 1114       Psychosocial Re-Evaluation   Current issues with None Identified    Comments Tammatha denies any new psychosocial barriers or concerns at this time.    Expected Outcomes For Jacolyn to participate in PR free of any psychosocial barriers or concerns    Interventions Encouraged to attend Pulmonary Rehabilitation for the exercise    Continue Psychosocial Services  No Follow up required             Education: Education Goals: Education classes will be provided on a weekly basis, covering required topics. Participant will state understanding/return demonstration of topics presented.  Learning Barriers/Preferences:  Learning Barriers/Preferences - 01/02/24 1529       Learning Barriers/Preferences   Learning Barriers Sight    Learning Preferences Pictoral;Group Instruction;Individual Instruction;Verbal Instruction;Written Material             Education Topics: Know Your Numbers Group instruction that is supported by a PowerPoint presentation. Instructor discusses importance of knowing and understanding resting, exercise, and post-exercise oxygen saturation, heart rate, and blood pressure. Oxygen saturation, heart rate, blood pressure, rating of perceived exertion, and dyspnea are reviewed along with a normal range for these values.    Exercise for the Pulmonary Patient Group instruction that is supported by a PowerPoint presentation. Instructor discusses benefits of exercise, core components of exercise, frequency, duration, and intensity of an exercise routine, importance of utilizing pulse oximetry during exercise, safety while  exercising, and options of places to exercise outside of rehab.    MET Level  Group instruction provided by PowerPoint, verbal discussion, and written material to support subject matter. Instructor reviews what METs are and how to increase METs.    Pulmonary Medications Verbally interactive group education provided by instructor with focus on inhaled medications and proper administration.   Anatomy and Physiology of the Respiratory System Group instruction provided by PowerPoint, verbal discussion, and written material to support subject matter. Instructor reviews respiratory cycle and anatomical components of the respiratory system and their functions. Instructor also reviews differences in obstructive and restrictive respiratory diseases with examples of each.  Flowsheet Row PULMONARY REHAB OTHER RESPIRATORY from 01/19/2024 in [facility]  Date 01/19/24  Educator RT  Instruction Review Code 1- Verbalizes Understanding       Oxygen Safety Group instruction provided by PowerPoint, verbal discussion, and written material to support subject matter. There is an overview of "What is Oxygen" and "Why do we need it".  Instructor also reviews how to create a safe environment for oxygen use, the importance of using oxygen as prescribed, and the risks of noncompliance. There is a brief discussion on traveling with oxygen and resources the patient may utilize.   Oxygen Use Group instruction provided by PowerPoint, verbal discussion, and written material to discuss how supplemental oxygen is prescribed and different types of oxygen supply systems. Resources for more information are provided.    Breathing Techniques Group instruction that is supported by demonstration and informational handouts. Instructor discusses the benefits of pursed lip and diaphragmatic breathing and detailed demonstration on how to perform both.     Risk Factor  Reduction Group instruction that is supported by a PowerPoint presentation. Instructor discusses the definition of a risk factor, different risk factors for pulmonary disease, and how the heart  and lungs work together.   Pulmonary Diseases Group instruction provided by PowerPoint, verbal discussion, and written material to support subject matter. Instructor gives an overview of the different type of pulmonary diseases. There is also a discussion on risk factors and symptoms as well as ways to manage the diseases. Flowsheet Row PULMONARY REHAB OTHER RESPIRATORY from 01/12/2024 in [facility], & Lung [facility]  Date 01/12/24  Educator RT  Instruction Review Code 1- Verbalizes Understanding       Stress and Energy Conservation Group instruction provided by PowerPoint, verbal discussion, and written material to support subject matter. Instructor gives an overview of stress and the impact it can have on the body. Instructor also reviews ways to reduce stress. There is also a discussion on energy conservation and ways to conserve energy throughout the day.   Warning Signs and Symptoms Group instruction provided by PowerPoint, verbal discussion, and written material to support subject matter. Instructor reviews warning signs and symptoms of stroke, heart attack, cold and flu. Instructor also reviews ways to prevent the spread of infection.   Other Education Group or individual verbal, written, or video instructions that support the educational goals of the pulmonary rehab program. Flowsheet Row PULMONARY REHAB OTHER RESPIRATORY from 01/26/2024 in [facility]  Date 01/26/24  Educator EP  Instruction Review Code 1- Verbalizes Understanding        Knowledge Questionnaire Score:  Knowledge Questionnaire Score - 01/02/24 1425       Knowledge Questionnaire Score   Pre Score 16/18              Core Components/Risk Factors/Patient Goals at Admission:  Personal Goals and Risk Factors at Admission - 01/02/24 1434       Core Components/Risk Factors/Patient Goals on Admission   Improve shortness of breath with ADL's Yes    Intervention Provide education, individualized exercise plan and daily activity instruction to help decrease symptoms of SOB with activities of daily living.    Expected Outcomes Short Term: Improve cardiorespiratory fitness to achieve a reduction of symptoms when performing ADLs;Long Term: Be able to perform more ADLs without symptoms or delay the onset of symptoms             Core Components/Risk Factors/Patient Goals Review:   Goals and Risk Factor Review     Row Name 01/03/24 1612 01/25/24 1116           Core Components/Risk Factors/Patient Goals Review   Personal Goals Review Improve shortness of breath with ADL's;Develop more efficient breathing techniques such as purse lipped breathing and diaphragmatic breathing and practicing self-pacing with activity. Improve shortness of breath with ADL's;Develop more efficient breathing techniques such as purse lipped breathing and diaphragmatic breathing and practicing self-pacing with activity.      Review Unable to assess, pt has not started the program yet. Breasia is scheduled to start the program next week Goal progressing for improving shortness of breath with ADL's. Ginette is currently able to maintain sats >88% on RA while exercising. She is currently exercising on the recumbant bike and the Nustep. She is gradually increasing her MET's. Goal progressing for developing more efficient breathing techniques such as purse lipped breathing and diaphragmatic breathing; and practicing self-pacing with activity. We will continue to monitor Wafaa's progress throughout the program.      Expected Outcomes Eimi will improve her shortness of breath with ADLs and develop more efficient breathing techniques  such as  purse lipped breathing and diaphragmatic breathing; and practicing self-pacing with activity Nesiah will improve her shortness of breath with ADLs and develop more efficient breathing techniques such as purse lipped breathing and diaphragmatic breathing; and practicing self-pacing with activity               Core Components/Risk Factors/Patient Goals at Discharge (Final Review):   Goals and Risk Factor Review - 01/25/24 1116       Core Components/Risk Factors/Patient Goals Review   Personal Goals Review Improve shortness of breath with ADL's;Develop more efficient breathing techniques such as purse lipped breathing and diaphragmatic breathing and practicing self-pacing with activity.    Review Goal progressing for improving shortness of breath with ADL's. Londan is currently able to maintain sats >88% on RA while exercising. She is currently exercising on the recumbant bike and the Nustep. She is gradually increasing her MET's. Goal progressing for developing more efficient breathing techniques such as purse lipped breathing and diaphragmatic breathing; and practicing self-pacing with activity. We will continue to monitor Ceira's progress throughout the program.    Expected Outcomes Jonella will improve her shortness of breath with ADLs and develop more efficient breathing techniques such as purse lipped breathing and diaphragmatic breathing; and practicing self-pacing with activity             ITP Comments:Pt is making expected progress toward Pulmonary Rehab goals after completing 7 session(s). Recommend continued exercise, life style modification, education, and utilization of breathing techniques to increase stamina and strength, while also decreasing shortness of breath with exertion.  Dr. Mechele Collin is Medical Director for Pulmonary Rehab at [facility].     Comments:

## 2024-02-02 ENCOUNTER — Encounter (HOSPITAL_COMMUNITY)
Admission: RE | Admit: 2024-02-02 | Discharge: 2024-02-02 | Disposition: A | Discharge status: Home / Self Care | Payer: Medicare PPO | Source: Ambulatory Visit | Attending: Internal Medicine | Admitting: Internal Medicine

## 2024-02-02 DIAGNOSIS — J9611 Chronic respiratory failure with hypoxia: Secondary | ICD-10-CM

## 2024-02-02 DIAGNOSIS — I2699 Other pulmonary embolism without acute cor pulmonale: Secondary | ICD-10-CM | POA: Diagnosis not present

## 2024-02-02 NOTE — Progress Notes (Signed)
 Daily Session Note  Patient Details  Name: Jocelyn [name] MRN: [id number] Date of Birth: 04-Feb-1956 Referring Provider:   Doristine Devoid Pulmonary Rehab Walk Test from 01/02/2024 in [facility], [facility]  Referring Provider Ramaswamy       Encounter Date: 02/02/2024  Check In:  Session Check In - 02/02/24 1027       Check-In   Supervising physician immediately available to respond to emergencies [facility] MD immediately available    Physician(s) Reather Littler, NP    Location [facility]    Staff Present Durel Salts, Zella Richer, MS, ACSM-CEP, Exercise Physiologist;Randi Idelle Crouch BS, ACSM-CEP, Exercise Physiologist;Samantha Belarus, RD, Dutch Gray, RN, BSN    Virtual Visit No    Medication changes reported     No    Fall or balance concerns reported    No    Tobacco Cessation No Change    Warm-up and Cool-down Performed as group-led Writer Performed Yes    VAD Patient? No    PAD/SET Patient? No      Pain Assessment   Currently in Pain? No/denies             Capillary Blood Glucose: No results found for this or any previous visit (from the past 24 hours).    Social History   Tobacco Use  Smoking Status Never  Smokeless Tobacco Never  Tobacco Comments   Daily Caffeine - 1  Exercise 2-3 times/weekly    Goals Met:  Exercise tolerated well No report of concerns or symptoms today Strength training completed today  Goals Unmet:  BP, low BP post exercise, 90/48, pt asymptomatic  Comments: Service time is from 1013 to 1132    Dr. Mechele Collin is Medical Director for Pulmonary Rehab at [facility].

## 2024-02-07 ENCOUNTER — Encounter (HOSPITAL_COMMUNITY): Payer: Medicare PPO

## 2024-02-07 ENCOUNTER — Telehealth (HOSPITAL_COMMUNITY): Payer: Self-pay

## 2024-02-07 ENCOUNTER — Encounter (HOSPITAL_COMMUNITY)
Admission: RE | Admit: 2024-02-07 | Discharge: 2024-02-07 | Disposition: A | Discharge status: Home / Self Care | Source: Ambulatory Visit | Attending: Internal Medicine | Admitting: Internal Medicine

## 2024-02-07 DIAGNOSIS — I2699 Other pulmonary embolism without acute cor pulmonale: Secondary | ICD-10-CM | POA: Diagnosis not present

## 2024-02-07 DIAGNOSIS — J9611 Chronic respiratory failure with hypoxia: Secondary | ICD-10-CM

## 2024-02-07 NOTE — Telephone Encounter (Signed)
 Patient called at 10am to request attending 1:15p class today instead of 10:15a class.

## 2024-02-07 NOTE — Progress Notes (Signed)
 Daily Session Note  Patient Details  Name: Jocelyn [name] MRN: [id number] Date of Birth: 04/29/1956 Referring Provider:   Doristine Devoid Pulmonary Rehab Walk Test from 01/02/2024 in [facility] for [facility], Vascular, & Lung Health  Referring Provider Ramaswamy       Encounter Date: 02/07/2024  Check In:  Session Check In - 02/07/24 1306       Check-In   Supervising physician immediately available to respond to emergencies [facility] MD immediately available    Physician(s) Ma Hillock, NP    Location [facility]    Staff Present Durel Salts, Zella Richer, MS, ACSM-CEP, Exercise Physiologist;Randi Dionisio Paschal, ACSM-CEP, Exercise Physiologist;Mary Gerre Scull, RN, BSN;Johnny Porter, MS, Exercise Physiologist;Samantha Belarus, RD, LDN    Virtual Visit No    Medication changes reported     No    Fall or balance concerns reported    No    Tobacco Cessation No Change    Warm-up and Cool-down Performed as group-led instruction    Resistance Training Performed Yes    VAD Patient? No    PAD/SET Patient? No      Pain Assessment   Currently in Pain? No/denies    Multiple Pain Sites No             Capillary Blood Glucose: No results found for this or any previous visit (from the past 24 hours).    Social History   Tobacco Use  Smoking Status Never  Smokeless Tobacco Never  Tobacco Comments   Daily Caffeine - 1  Exercise 2-3 times/weekly    Goals Met:  Proper associated with RPD/PD & O2 Sat Independence with exercise equipment Exercise tolerated well No report of concerns or symptoms today Strength training completed today  Goals Unmet:  Not Applicable  Comments: Service time is from 1314 to 1440.    Dr. Mechele Collin is Medical Director for Pulmonary Rehab at [facility].

## 2024-02-08 ENCOUNTER — Other Ambulatory Visit (HOSPITAL_COMMUNITY): Payer: Self-pay

## 2024-02-08 NOTE — Progress Notes (Unsigned)
 Paramedicine Encounter    Patient ID: Jocelyn [name], female    DOB: [birth year], 68 y.o.   MRN: [id number]   Complaints-none   Edema-none   Compliance with meds-yes   Pill box filled-yes  If so, by whom-pt filled it, it was accurate   Refills needed-none   Pt reports she is doing good. She denies any complaints.  She has not felt the need to use her 02.  She reports her breathing is doing ok. She denies c/p, no dizziness.  She is doing well with pulm rehab.  Pill boxes checked and accurate. She is doing much better with compliance with accountability now. She goes to lipid clinic this week so hopefully those numbers are much better. Will f/u in couple wks.   BP 124/72   Pulse 74   Resp 16   SpO2 99%  Weight yesterday-148 Last visit weight-149   Patient Care Team: Renaye Rakers, MD as PCP - General (Family Medicine) Kathleene Hazel, MD as PCP - Cardiology (Cardiology)  Patient Active Problem List   Diagnosis Date Noted   Physical deconditioning 08/23/2023   Acute respiratory failure with hypoxia (HCC) 08/05/2023   Pulmonary embolism (HCC) 08/03/2023   Pulmonary edema 08/03/2023   Degenerative arthritis of knee 08/20/2021   Hx of pleural effusion    Dyspnea 08/16/2018   Fall on or from sidewalk curb, initial encounter 05/31/2018   History of acute inferior wall MI 08/22/2017   Diabetes mellitus (HCC)    S/P right coronary artery (RCA) stent placement    Overweight 05/05/2017   Diabetes mellitus type 2, controlled, without complications (HCC) 12/16/2016   Asthmatic bronchitis , chronic (HCC) 05/10/2016   Polymyositis (HCC) 09/15/2015   History of sarcoidosis 09/15/2015   Falling 03/13/2015   Edema 02/13/2014   Coronary artery disease involving native heart without angina pectoris 10/29/2013   Hyperlipidemia 10/29/2013   Iron (Fe) deficiency anemia 08/08/2013   Vitamin B 12 deficiency 08/08/2013   GERD (gastroesophageal reflux disease) 09/08/2011    Weakness 02/22/2011   Depression 02/22/2011   MENIERE'S DISEASE 04/30/2009   Vitamin D deficiency 02/05/2009   Venous (peripheral) insufficiency 12/12/2007   RENAL CALCULUS 12/12/2007   DIZZINESS 12/12/2007   HYPERCHOLESTEROLEMIA 09/16/2007   Anxiety state 09/16/2007   HYPERTENSION, BENIGN 09/16/2007   Allergic rhinitis 09/16/2007   IRRITABLE BOWEL SYNDROME 09/16/2007   Myalgia and myositis 09/16/2007    Current Outpatient Medications:    allopurinol (ZYLOPRIM) 100 MG tablet, Take 1 tablet by mouth daily as needed., Disp: , Rfl:    ALPRAZolam (XANAX) 0.5 MG tablet, Take 0.5 mg by mouth 3 (three) times daily., Disp: , Rfl:    apixaban (ELIQUIS) 5 MG TABS tablet, Take 10 mg by mouth twice daily, then on 08/17/2023-switch to 5 mg by mouth twice daily., Disp: 180 tablet, Rfl: 3   azaTHIOprine (IMURAN) 50 MG tablet, Take 1 tablet by mouth daily., Disp: , Rfl:    Bempedoic Acid (NEXLETOL) 180 MG TABS, TAKE 1 TABLET BY MOUTH DAILY, Disp: 90 tablet, Rfl: 3   Empagliflozin-metFORMIN HCl ER 25-1000 MG TB24, Take 1 tablet by mouth daily., Disp: , Rfl:    GEMTESA 75 MG TABS, Take 1 tablet by mouth daily., Disp: , Rfl:    icosapent Ethyl (VASCEPA) 1 g capsule, TAKE 2 CAPSULES(2 GRAMS) BY MOUTH TWICE DAILY, Disp: 360 capsule, Rfl: 3   metoprolol succinate (TOPROL-XL) 100 MG 24 hr tablet, Take 1 tablet (100 mg total) by mouth daily. Take with or immediately  following a meal., Disp: 90 tablet, Rfl: 3   nitroGLYCERIN (NITROSTAT) 0.4 MG SL tablet, Place 1 tablet (0.4 mg total) under the tongue every 5 (five) minutes as needed for chest pain., Disp: 25 tablet, Rfl: 2   ONETOUCH VERIO test strip, CHECK BLOOD SUGAR TID AS DIRECTED, Disp: , Rfl:    pantoprazole (PROTONIX) 40 MG tablet, Take 1 tablet (40 mg total) by mouth daily., Disp: 30 tablet, Rfl: 0   PRALUENT 150 MG/ML SOAJ, ADMINISTER 1 ML UNDER THE SKIN EVERY 14 DAYS, Disp: 6 mL, Rfl: 3   spironolactone (ALDACTONE) 25 MG tablet, TAKE 1 TABLET BY MOUTH  EVERY DAY, Disp: 90 tablet, Rfl: 2 Allergies  Allergen Reactions   Actos [Pioglitazone] Other (See Comments)    REACTION: pt states INTOL w/ edema   Codeine Other (See Comments)    REACTION: vomiting   Januvia [Sitagliptin] Nausea Only   Lactose Intolerance (Gi) Diarrhea   Morphine Nausea Only and Nausea And Vomiting    REACTION: vomiting REACTION: vomiting   Repatha [Evolocumab]     WAS NOT EFFECTIVE IN LOWERING LDL      Social History   Socioeconomic History   Marital status: Single    Spouse name: Not on file   Number of children: 1   Years of education: Not on file   Highest education level: Master's degree (e.g., MA, MS, MEng, MEd, MSW, MBA)  Occupational History   Occupation: Retired Runner, broadcasting/film/video   Tobacco Use   Smoking status: Never   Smokeless tobacco: Never   Tobacco comments:    Daily Caffeine - 1  Exercise 2-3 times/weekly  Vaping Use   Vaping status: Never Used  Substance and Sexual Activity   Alcohol use: No    Alcohol/week: 0.0 standard drinks of alcohol   Drug use: No   Sexual activity: Yes    Birth control/protection: None  Other Topics Concern   Not on file  Social History Narrative   Exercises 2-3 times weekly   Caffeine Use: 1 daily (tea or Pepsi)   Lives alone in a one story home.  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  Retired Midwife.     Social Drivers of Corporate investment banker Strain: Low Risk  (08/05/2023)   Overall Financial Resource Strain (CARDIA)    Difficulty of Paying Living Expenses: Not hard at all  Food Insecurity: No Food Insecurity (08/05/2023)   Hunger Vital Sign    Worried About Running Out of Food in the Last Year: Never true    Ran Out of Food in the Last Year: Never true  Transportation Needs: No Transportation Needs (08/05/2023)   PRAPARE - Administrator, Civil Service (Medical): No    Lack of Transportation (Non-Medical): No  Physical Activity: Not on file  Stress: No Stress Concern Present (08/05/2023)    [employer] of Occupational Health - Occupational Stress Questionnaire    Feeling of Stress : Not at all  Social Connections: Not on file  Intimate Partner Violence: Not At Risk (08/05/2023)   Humiliation, Afraid, Rape, and Kick questionnaire    Fear of Current or Ex-Partner: No    Emotionally Abused: No    Physically Abused: No    Sexually Abused: No    Physical Exam      Future Appointments  Date Time Provider Department Center  02/09/2024 10:15 AM [facility]AD [facility] None  02/10/2024  2:30 PM [facility] PHARMACIST [facility]  02/14/2024 10:15 AM [facility] UNDERGRAD [facility] None  02/16/2024 10:15 AM [facility] UNDERGRAD [facility] None  02/21/2024 10:15 AM [facility] UND[facility]GRAD [facility] None  02/23/2024 10:15 AM [facility] UNDERGRAD [facility] None  02/28/2024 10:15 AM [facility] UNDERGRAD [facility] None  03/01/2024 10:15 AM [facility]GRAD [facility] None  03/06/2024 10:15 AM [facility]GRAD [facility] None  03/08/2024 10:15 AM [facility]GRAD [facility] None  03/13/2024 10:15 AM [facility] UNDERGRAD [facility] None  03/15/2024 10:15 AM [facility] UNDERGRAD [facility] None  03/20/2024 10:15 AM [facility]ERGRAD [facility] None  03/22/2024 10:15 AM [facility] UNDERGRAD [facility] None  03/27/2024 10:15 AM [facility] UNDERGRAD [facility] None  03/29/2024 10:15 AM [facility] UNDERGRAD [facility] None  06/06/2024  2:00 PM [facility] PA/NP [facility] None  07/30/2024  1:45 PM Windell Norfolk, MD [facility] None       Kerry Hough, Paramedic [phone] [facility] Par[facility]ic  02/08/24

## 2024-02-09 ENCOUNTER — Encounter (HOSPITAL_COMMUNITY)
Admission: RE | Admit: 2024-02-09 | Discharge: 2024-02-09 | Disposition: A | Discharge status: Home / Self Care | Payer: Medicare PPO | Source: Ambulatory Visit | Attending: Internal Medicine | Admitting: Internal Medicine

## 2024-02-09 DIAGNOSIS — I2699 Other pulmonary embolism without acute cor pulmonale: Secondary | ICD-10-CM

## 2024-02-09 DIAGNOSIS — J9611 Chronic respiratory failure with hypoxia: Secondary | ICD-10-CM

## 2024-02-09 NOTE — Progress Notes (Addendum)
 Daily Session Note  Patient Details  Name: Jocelyn [name] MRN: [id number] Date of Birth: June 22, 1956 Referring Provider:   Doristine Devoid Pulmonary Rehab Walk Test from 01/02/2024 in [facility]  Referring Provider Ramaswamy       Encounter Date: 02/09/2024  Check In:  Session Check In - 02/09/24 1151       Check-In   Supervising physician immediately available to respond to emergencies [facility] MD immediately available    Physician(s) Jari Favre, PA    Location [facility]    Staff Present Durel Salts, Zella Richer, MS, ACSM-CEP, Exercise Physiologist;Jaidah Lomax Dionisio Paschal, ACSM-CEP, Exercise Physiologist;Mary Gerre Scull, RN, BSN;Johnny Porter, MS, Exercise Physiologist;Samantha Belarus, RD, LDN    Virtual Visit No    Medication changes reported     No    Fall or balance concerns reported    No    Tobacco Cessation No Change    Warm-up and Cool-down Performed as group-led instruction    Resistance Training Performed Yes    VAD Patient? No    PAD/SET Patient? No      Pain Assessment   Currently in Pain? No/denies             Capillary Blood Glucose: No results found for this or any previous visit (from the past 24 hours).    Social History   Tobacco Use  Smoking Status Never  Smokeless Tobacco Never  Tobacco Comments   Daily Caffeine - 1  Exercise 2-3 times/weekly    Goals Met:  Independence with exercise equipment Exercise tolerated well No report of concerns or symptoms today Strength training completed today  Goals Unmet:  Not Applicable  Comments: Service time is from 1018 to 1135.    Dr. Mechele Collin is Medical Director for Pulmonary Rehab at [facility].

## 2024-02-10 ENCOUNTER — Ambulatory Visit: Payer: Medicare PPO | Attending: Internal Medicine | Admitting: Pharmacist

## 2024-02-10 DIAGNOSIS — E7800 Pure hypercholesterolemia, unspecified: Secondary | ICD-10-CM | POA: Diagnosis not present

## 2024-02-10 DIAGNOSIS — E785 Hyperlipidemia, unspecified: Secondary | ICD-10-CM | POA: Diagnosis not present

## 2024-02-10 DIAGNOSIS — I2699 Other pulmonary embolism without acute cor pulmonale: Secondary | ICD-10-CM | POA: Diagnosis not present

## 2024-02-10 NOTE — Assessment & Plan Note (Signed)
 Assessment: LDL-C on last check was above goal of less than 55, however patient had gotten off schedule with her Praluent injections She cannot take statins or Zetia On Praluent 150 mg, Nexletol and Vascepa There really is not any other cholesterol medication to add I suspect her LDL-C would be much better when we recheck it now that she has started taking it consistently I added a reminder in her calendar on her phone for her Praluent Participating pulmonary rehab twice a week  Plan: Continue Praluent 150 mg every 14 days, Nexletol 180 mg daily, Vascepa 2 g twice a day Recheck labs at the end of April or beginning of May

## 2024-02-10 NOTE — Patient Instructions (Addendum)
 Continue Praluent 150mg  every 14 days, Nexletol 180mg  daily, Vascepa 2g twice a day Set a reminder in your phone for Praluent  Please go for lab-work at the end of April, beginning of May. You can go to [facility] or any of the [facility] or our heart and vascular building on [facility].

## 2024-02-10 NOTE — Progress Notes (Signed)
 Patient ID: Jocelyn [name]                 DOB: [birth year]                    MRN: [id number]      HPI: Jocelyn [name] is a 68 y.o. female patient referred to lipid clinic by Dr. Clifton James. PMH is significant for  CAD, PE, anxiety, HTN, fibromyalgia, HLD, sleep apnea, DM, polymyositis and sarcoidosis. She had in inferior STEMI in 2018 and had a drug eluting stent placed in the distal RCA. Mild disease in the LAD. Nuclear stress test 08/02/23 with no ischemia. She was admitted to [facility] in September 2024 with a saddle pulmonary embolus with PEA arrest and underwent aspiration thrombectomy   Patient had increased CK with statins and increased LFTs with Zetia.  She had been on Repatha since 2017.  Her baseline LDL-C is around 200.  Nexletol was added in 2021.  LDL-C did not improve so she was changed to Praluent.  LDL-C came down to 91.  Her LDL-C 12/08/2023 was 141.  Patient reports getting off schedule for a while but was back on schedule now.  Patient presents today to lipid clinic.  She reports when she was in the hospital in September she got off schedule with her medications.  It was not until she got the labs done in January that she got back on schedule with her Praluent injections.  She now tries to give them on the days [name] from paramedicine comes to visit her.   Reports compliance with Nexletol and Vascepa.  Makes up her own med box and [name] checks them.  Has a healthwell grant to help with the cost of these.   Current Medications: Praluent 150mg  every 14 days, Nexletol 180mg  daily, Vascepa 2g twice a day Intolerances: Zetia (increased LFTs), statins (increased CK), Repatha (changed to Praluent) Risk Factors: CAD, diabetes LDL-C goal: <55 ApoB goal: <70  Diet: not much appetite Avoiding fast food Trying to get in fruits and veggies  Exercise: pulmonary rehab  Family History:  Family History  Adopted: Yes  Problem Relation Age of Onset   Other Other        ADOPTED   Colon cancer  Neg Hx    Esophageal cancer Neg Hx    Rectal cancer Neg Hx    Stomach cancer Neg Hx     Social History: no tobacco, no ETOH  Labs: Lipid Panel     Component Value Date/Time   CHOL 217 (H) 12/08/2023 1453   CHOL 205 (H) 12/16/2022 1608   TRIG 245 (H) 12/08/2023 1453   HDL 27 (L) 12/08/2023 1453   HDL 38 (L) 12/16/2022 1608   CHOLHDL 8.0 12/08/2023 1453   VLDL 49 (H) 12/08/2023 1453   LDLCALC 141 (H) 12/08/2023 1453   LDLCALC 135 (H) 12/16/2022 1608   LDLDIRECT 137 (H) 12/16/2022 1608   LDLDIRECT 91.6 08/14/2014 0822   LABVLDL 32 12/16/2022 1608    Past Medical History:  Diagnosis Date   Acute bronchitis    Allergic rhinitis, cause unspecified    Anemia    Anxiety    B12 deficiency    BV (bacterial vaginosis) 06/22/1996   Calculus of kidney    Calculus of kidney    Coronary atherosclerosis of unspecified type of vessel, native or graft    Dizziness    Essential hypertension, benign    Fibroid 2003   Fibromyalgia    H/O  dysmenorrhea 2008   H/O varicella    Headache(784.0)    frequently   HSV-2 infection 2009   Hyperplastic colon polyp 05/16/2014   Irritable bowel syndrome    Meniere's disease, unspecified    Menses, irregular 2003   Myalgia and myositis, unspecified    Myocardial infarction (HCC)    Obstructive sleep apnea (adult) (pediatric)    Perimenopausal symptoms 2003   Pure hypercholesterolemia    Sarcoidosis    Type II or unspecified type diabetes mellitus without mention of complication, not stated as uncontrolled    Unspecified venous (peripheral) insufficiency    Vitamin D deficiency    Vulvitis 2010   Yeast infection     Current Outpatient Medications on File Prior to Visit  Medication Sig Dispense Refill   allopurinol (ZYLOPRIM) 100 MG tablet Take 1 tablet by mouth daily as needed.     ALPRAZolam (XANAX) 0.5 MG tablet Take 0.5 mg by mouth 3 (three) times daily.     apixaban (ELIQUIS) 5 MG TABS tablet Take 10 mg by mouth twice daily, then on  08/17/2023-switch to 5 mg by mouth twice daily. 180 tablet 3   azaTHIOprine (IMURAN) 50 MG tablet Take 1 tablet by mouth daily.     Bempedoic Acid (NEXLETOL) 180 MG TABS TAKE 1 TABLET BY MOUTH DAILY 90 tablet 3   Empagliflozin-metFORMIN HCl ER 25-1000 MG TB24 Take 1 tablet by mouth daily.     GEMTESA 75 MG TABS Take 1 tablet by mouth daily.     icosapent Ethyl (VASCEPA) 1 g capsule TAKE 2 CAPSULES(2 GRAMS) BY MOUTH TWICE DAILY 360 capsule 3   metoprolol succinate (TOPROL-XL) 100 MG 24 hr tablet Take 1 tablet (100 mg total) by mouth daily. Take with or immediately following a meal. 90 tablet 3   nitroGLYCERIN (NITROSTAT) 0.4 MG SL tablet Place 1 tablet (0.4 mg total) under the tongue every 5 (five) minutes as needed for chest pain. 25 tablet 2   ONETOUCH VERIO test strip CHECK BLOOD SUGAR TID AS DIRECTED     pantoprazole (PROTONIX) 40 MG tablet Take 1 tablet (40 mg total) by mouth daily. 30 tablet 0   PRALUENT 150 MG/ML SOAJ ADMINISTER 1 ML UNDER THE SKIN EVERY 14 DAYS 6 mL 3   spironolactone (ALDACTONE) 25 MG tablet TAKE 1 TABLET BY MOUTH EVERY DAY 90 tablet 2   No current facility-administered medications on file prior to visit.    Allergies  Allergen Reactions   Actos [Pioglitazone] Other (See Comments)    REACTION: pt states INTOL w/ edema   Codeine Other (See Comments)    REACTION: vomiting   Januvia [Sitagliptin] Nausea Only   Lactose Intolerance (Gi) Diarrhea   Morphine Nausea Only and Nausea And Vomiting    REACTION: vomiting REACTION: vomiting   Repatha [Evolocumab]     WAS NOT EFFECTIVE IN LOWERING LDL    Assessment/Plan:  1. Hyperlipidemia -  HYPERCHOLESTEROLEMIA Assessment: LDL-C on last check was above goal of less than 55, however patient had gotten off schedule with her Praluent injections She cannot take statins or Zetia On Praluent 150 mg, Nexletol and Vascepa There really is not any other cholesterol medication to add I suspect her LDL-C would be much better  when we recheck it now that she has started taking it consistently I added a reminder in her calendar on her phone for her Praluent Participating pulmonary rehab twice a week  Plan: Continue Praluent 150 mg every 14 days, Nexletol 180 mg daily, Vascepa  2 g twice a day Recheck labs at the end of April or beginning of May   Cardiomyopathy: Patient asking if there was help with her Jardiance/metformin combination.  Will see if we can get health will grant for this particular drug or if we need to prescribe Jardiance and metformin separately  Thank you,  Olene Floss, Pharm.D, BCACP, CPP  [facility] [address], [place], [place] [address]  Phone: [phone]; Fax: [phone]

## 2024-02-13 ENCOUNTER — Telehealth: Payer: Self-pay

## 2024-02-13 ENCOUNTER — Other Ambulatory Visit (HOSPITAL_COMMUNITY): Payer: Self-pay

## 2024-02-13 MED ORDER — EMPAGLIFLOZIN 25 MG PO TABS: 25.0000 mg | ORAL_TABLET | Freq: Every day | ORAL | 3 refills | Status: DC

## 2024-02-13 MED ORDER — METFORMIN HCL ER (OSM) 1000 MG PO TB24: 1000.0000 mg | ORAL_TABLET | Freq: Every day | ORAL | 3 refills | Status: DC

## 2024-02-13 MED ORDER — METFORMIN HCL ER (MOD) 1000 MG PO TB24: 1000.0000 mg | ORAL_TABLET | Freq: Every day | ORAL | 3 refills | Status: DC

## 2024-02-13 NOTE — Telephone Encounter (Signed)
 Patient made aware of plan to split jardiance and metformin in order to use grant

## 2024-02-13 NOTE — Telephone Encounter (Signed)
-----   Message from Olene Floss sent at 02/10/2024  4:30 PM EDT ----- Can we see if the cardiomyopathy grant will work for [employer].  If not we will need to prescribe Jardiance and metformin separately.  On Jardiance for both cardiomyopathy and diabetes

## 2024-02-13 NOTE — Telephone Encounter (Signed)
 Patient Advocate Encounter   The patient was approved for a Healthwell grant that will help cover the cost of JARDIANCE Total amount awarded, $10,000.  Effective: 01/14/24 - 01/12/25   [id number] [id number]   Pharmacy provided with approval and processing information. Patient informed via Dorcas Carrow, CPhT  Pharmacy Patient Advocate Specialist  Direct Number: [phone] Fax: [phone]

## 2024-02-13 NOTE — Telephone Encounter (Signed)
 Done, please see separate encounter

## 2024-02-13 NOTE — Addendum Note (Signed)
 Addended by: Malena Peer D on: 02/13/2024 02:38 PM   Modules accepted: Orders

## 2024-02-13 NOTE — Telephone Encounter (Signed)
 Ok, I'm going to prescribe it separately for her then. Can you get healthwell for Jardiance?

## 2024-02-13 NOTE — Telephone Encounter (Signed)
 Checked the cardiomyopathy fund product list. I don't see Synjardy on it.

## 2024-02-14 ENCOUNTER — Encounter (HOSPITAL_COMMUNITY)
Admission: RE | Admit: 2024-02-14 | Discharge: 2024-02-14 | Disposition: A | Discharge status: Home / Self Care | Payer: Medicare PPO | Source: Ambulatory Visit | Attending: Internal Medicine | Admitting: Internal Medicine

## 2024-02-14 ENCOUNTER — Other Ambulatory Visit (HOSPITAL_COMMUNITY): Payer: Self-pay

## 2024-02-14 ENCOUNTER — Telehealth: Payer: Self-pay | Admitting: Pharmacy Technician

## 2024-02-14 VITALS — Wt 149.5 lb

## 2024-02-14 DIAGNOSIS — J9611 Chronic respiratory failure with hypoxia: Secondary | ICD-10-CM | POA: Insufficient documentation

## 2024-02-14 DIAGNOSIS — I2699 Other pulmonary embolism without acute cor pulmonale: Secondary | ICD-10-CM | POA: Insufficient documentation

## 2024-02-14 MED ORDER — METFORMIN HCL ER 500 MG PO TB24: 1000.0000 mg | ORAL_TABLET | Freq: Every day | ORAL | 3 refills | Status: DC

## 2024-02-14 NOTE — Progress Notes (Signed)
 Daily Session Note  Patient Details  Name: Jocelyn [name] MRN: [id number] Date of Birth: 09-25-1956 Referring Provider:   Doristine Devoid Pulmonary Rehab Walk Test from 01/02/2024 in [facility]  Referring Provider Ramaswamy       Encounter Date: 02/14/2024  Check In:  Session Check In - 02/14/24 1045       Check-In   Supervising physician immediately available to respond to emergencies [facility] MD immediately available    Physician(s) Jari Favre, PA    [facility]    Staff Present Durel Salts, Zella Richer, MS, ACSM-CEP, Exercise Physiologist;Randi Dionisio Paschal, ACSM-CEP, Exercise Physiologist;Mary Gerre Scull, RN, BSN;Johnny Porter, MS, Exercise Physiologist;Samantha Belarus, RD, LDN    Virtual Visit No    Medication changes reported     No    Fall or balance concerns reported    No    Tobacco Cessation No Change    Warm-up and Cool-down Performed as group-led instruction    Resistance Training Performed Yes    VAD Patient? No    PAD/SET Patient? No      Pain Assessment   Currently in Pain? No/denies    Multiple Pain Sites No             Capillary Blood Glucose: No results found for this or any previous visit (from the past 24 hours).   Exercise Prescription Changes - 02/14/24 1100       Response to Exercise   Blood Pressure (Admit) 110/64    Blood Pressure (Exercise) 112/60    Blood Pressure (Exit) 100/60    Heart Rate (Admit) 80 bpm    Heart Rate (Exercise) 89 bpm    Heart Rate (Exit) 82 bpm    Oxygen Saturation (Admit) 100 %    Oxygen Saturation (Exercise) 98 %    Oxygen Saturation (Exit) 99 %    Rating of Perceived Exertion (Exercise) 12    Perceived Dyspnea (Exercise) 1    Duration Continue with 30 min of aerobic exercise without signs/symptoms of physical distress.    Intensity THRR unchanged      Progression   Progression Continue to progress workloads to maintain intensity  without signs/symptoms of physical distress.      Resistance Training   Training Prescription Yes    Weight red bands    Reps 10-15    Time 10 Minutes      Recumbant Bike   Level 3    RPM 26    Watts 12    Minutes 15    METs 1.9      NuStep   Level 3    SPM 91    Minutes 15    METs 1.9             Social History   Tobacco Use  Smoking Status Never  Smokeless Tobacco Never  Tobacco Comments   Daily Caffeine - 1  Exercise 2-3 times/weekly    Goals Met:  Proper associated with RPD/PD & O2 Sat Independence with exercise equipment Exercise tolerated well No report of concerns or symptoms today Strength training completed today  Goals Unmet:  Not Applicable  Comments: Service time is from 1024 to 1132.    Dr. Mechele Collin is Medical Director for Pulmonary Rehab at [facility].

## 2024-02-14 NOTE — Telephone Encounter (Signed)
 Hi! This came over as needing a prior authorization in covermymeds. I did a test claim anyways and it said: try metformin first. Can this be changed to the other metformin?  Thank you!      I tried the glucophage metformin and it went through for 0.00

## 2024-02-14 NOTE — Telephone Encounter (Signed)
 I called her pharmacy and they said they were able to fill the metformin er 500mg  for 90 days for no charge    This was changed to metformin er 500mg  bid

## 2024-02-14 NOTE — Telephone Encounter (Signed)
 Called to let pt know she will need to take 2 metformin 500mg  Xr tablets daily since insurance wont cover the XR 1000

## 2024-02-16 ENCOUNTER — Encounter (HOSPITAL_COMMUNITY): Payer: Medicare PPO

## 2024-02-21 ENCOUNTER — Encounter (HOSPITAL_COMMUNITY)
Admission: RE | Admit: 2024-02-21 | Discharge: 2024-02-21 | Disposition: A | Discharge status: Home / Self Care | Payer: Medicare PPO | Source: Ambulatory Visit | Attending: Internal Medicine | Admitting: Internal Medicine

## 2024-02-21 DIAGNOSIS — I2699 Other pulmonary embolism without acute cor pulmonale: Secondary | ICD-10-CM

## 2024-02-21 DIAGNOSIS — J9611 Chronic respiratory failure with hypoxia: Secondary | ICD-10-CM

## 2024-02-21 NOTE — Progress Notes (Signed)
 Daily Session Note  Patient Details  Name: Jocelyn [name] MRN: [id number] Date of Birth: [birth year] Referring Provider:   Doristine Devoid Pulmonary Rehab Walk Test from 01/02/2024 in [facility]  Referring Provider Ramaswamy       Encounter Date: 02/21/2024  Check In:  Session Check In - 02/21/24 1108       Check-In   Supervising physician immediately available to respond to emergencies [facility] MD immediately available    Physician(s) Rise Paganini, NP    [facility]    Staff Present Durel Salts, Zella Richer, MS, ACSM-CEP, Exercise Physiologist;Randi Dionisio Paschal, ACSM-CEP, Exercise Physiologist;Mary Gerre Scull, RN, BSN;Johnny Porter, MS, Exercise Physiologist;Samantha Belarus, RD, LDN    Virtual Visit No    Medication changes reported     No    Fall or balance concerns reported    No    Tobacco Cessation No Change    Warm-up and Cool-down Performed as group-led instruction    Resistance Training Performed Yes    VAD Patient? No    PAD/SET Patient? No      Pain Assessment   Currently in Pain? No/denies    Multiple Pain Sites No             Capillary Blood Glucose: No results found for this or any previous visit (from the past 24 hours).    Social History   Tobacco Use  Smoking Status Never  Smokeless Tobacco Never  Tobacco Comments   Daily Caffeine - 1  Exercise 2-3 times/weekly    Goals Met:  Proper associated with RPD/PD & O2 Sat Independence with exercise equipment Exercise tolerated well No report of concerns or symptoms today Strength training completed today  Goals Unmet:  Not Applicable  Comments: Service time is from 1023 to 1125.    Dr. Mechele Collin is Medical Director for Pulmonary Rehab at [facility].

## 2024-02-23 ENCOUNTER — Encounter (HOSPITAL_COMMUNITY)
Admission: RE | Admit: 2024-02-23 | Discharge: 2024-02-23 | Disposition: A | Discharge status: Home / Self Care | Payer: Medicare PPO | Source: Ambulatory Visit | Attending: Internal Medicine | Admitting: Internal Medicine

## 2024-02-23 DIAGNOSIS — J9611 Chronic respiratory failure with hypoxia: Secondary | ICD-10-CM | POA: Diagnosis not present

## 2024-02-23 DIAGNOSIS — I2699 Other pulmonary embolism without acute cor pulmonale: Secondary | ICD-10-CM | POA: Diagnosis not present

## 2024-02-23 NOTE — Progress Notes (Signed)
 Daily Session Note  Patient Details  Name: Jocelyn [name] MRN: [id number] Date of Birth: [birth year] Referring Provider:   Doristine Devoid Pulmonary Rehab Walk Test from 01/02/2024 in [facility]  Referring Provider Ramaswamy       Encounter Date: 02/23/2024  Check In:  Session Check In - 02/23/24 1027       Check-In   Supervising physician immediately available to respond to emergencies [facility] MD immediately available    Physician(s) Robin Searing NP    [facility]    Staff Present Durel Salts, Zella Richer, MS, ACSM-CEP, Exercise Physiologist;Randi Dionisio Paschal, ACSM-CEP, Exercise Physiologist;Mary Gerre Scull, RN, BSN;Johnny Porter, MS, Exercise Physiologist;Samantha Belarus, RD, LDN    Virtual Visit No    Medication changes reported     No    Fall or balance concerns reported    No    Tobacco Cessation No Change    Warm-up and Cool-down Performed as group-led instruction    Resistance Training Performed Yes    VAD Patient? No      Pain Assessment   Currently in Pain? No/denies             Capillary Blood Glucose: No results found for this or any previous visit (from the past 24 hours).    Social History   Tobacco Use  Smoking Status Never  Smokeless Tobacco Never  Tobacco Comments   Daily Caffeine - 1  Exercise 2-3 times/weekly    Goals Met:  Proper associated with RPD/PD & O2 Sat Independence with exercise equipment Exercise tolerated well No report of concerns or symptoms today Strength training completed today  Goals Unmet:  Not Applicable  Comments: Service time is from 1018 to 1133.    Dr. Mechele Collin is Medical Director for Pulmonary Rehab at [facility].

## 2024-02-28 ENCOUNTER — Encounter (HOSPITAL_COMMUNITY)
Admission: RE | Admit: 2024-02-28 | Discharge: 2024-02-28 | Disposition: A | Discharge status: Home / Self Care | Payer: Medicare PPO | Source: Ambulatory Visit | Attending: Internal Medicine | Admitting: Internal Medicine

## 2024-02-28 VITALS — Wt 149.9 lb

## 2024-02-28 DIAGNOSIS — J9611 Chronic respiratory failure with hypoxia: Secondary | ICD-10-CM | POA: Diagnosis not present

## 2024-02-28 DIAGNOSIS — I2699 Other pulmonary embolism without acute cor pulmonale: Secondary | ICD-10-CM

## 2024-02-28 NOTE — Progress Notes (Signed)
 Daily Session Note  Patient Details  Name: Jocelyn [name] MRN: [id number] Date of Birth: 1956/07/31 Referring Provider:   Doristine Devoid Pulmonary Rehab Walk Test from 01/02/2024 in [facility]  Referring Provider Ramaswamy       Encounter Date: 02/28/2024  Check In:  Session Check In - 02/28/24 1033       Check-In   Supervising physician immediately available to respond to emergencies [facility] MD immediately available    Physician(s) Rise Paganini, NP    [facility]    Staff Present Durel Salts, Zella Richer, MS, ACSM-CEP, Exercise Physiologist;Randi Dionisio Paschal, ACSM-CEP, Exercise Physiologist;Mary Gerre Scull, RN, BSN    Virtual Visit No    Medication changes reported     No    Fall or balance concerns reported    No    Tobacco Cessation No Change    Warm-up and Cool-down Performed as group-led instruction    Resistance Training Performed Yes    VAD Patient? No    PAD/SET Patient? No      Pain Assessment   Currently in Pain? No/denies    Multiple Pain Sites No             Capillary Blood Glucose: No results found for this or any previous visit (from the past 24 hours).   Exercise Prescription Changes - 02/28/24 1200       Response to Exercise   Blood Pressure (Admit) 108/60    Blood Pressure (Exercise) 132/72    Blood Pressure (Exit) 105/62    Heart Rate (Admit) 78 bpm    Heart Rate (Exercise) 94 bpm    Heart Rate (Exit) 83 bpm    Oxygen Saturation (Admit) 98 %    Oxygen Saturation (Exercise) 98 %    Oxygen Saturation (Exit) 98 %    Rating of Perceived Exertion (Exercise) 13    Perceived Dyspnea (Exercise) 1    Duration Continue with 30 min of aerobic exercise without signs/symptoms of physical distress.    Intensity THRR unchanged      Progression   Progression Continue to progress workloads to maintain intensity without signs/symptoms of physical distress.      Resistance  Training   Training Prescription Yes    Weight red bands    Reps 10-15    Time 10 Minutes      Recumbant Bike   Level 3    RPM 37    Watts 17    Minutes 15    METs 2.2      NuStep   Level 3    SPM 110    Minutes 15    METs 1.9             Social History   Tobacco Use  Smoking Status Never  Smokeless Tobacco Never  Tobacco Comments   Daily Caffeine - 1  Exercise 2-3 times/weekly    Goals Met:  Proper associated with RPD/PD & O2 Sat Independence with exercise equipment Exercise tolerated well No report of concerns or symptoms today Strength training completed today  Goals Unmet:  Not Applicable  Comments: Service time is from 1006 to 1136.    Dr. Mechele Collin is Medical Director for Pulmonary Rehab at [facility].

## 2024-02-29 ENCOUNTER — Other Ambulatory Visit (HOSPITAL_COMMUNITY): Payer: Self-pay

## 2024-02-29 NOTE — Progress Notes (Signed)
 Pulmonary Individual Treatment Plan  Patient Details  Name: Jocelyn [name] MRN: [id number] Date of Birth: 03/28/1956 Referring Provider:   Gattis Kass Pulmonary Rehab Walk Test from 01/02/2024 in [facility]  Referring Provider Ramaswamy       Initial Encounter Date:  Flowsheet Row Pulmonary Rehab Walk Test from 01/02/2024 in [facility]  Date 01/02/24       Visit Diagnosis: Pulmonary embolism, unspecified chronicity, unspecified pulmonary embolism type, unspecified whether acute cor pulmonale present (HCC)  Chronic respiratory failure with hypoxia (HCC)  Patient's Home Medications on Admission:   Current Outpatient Medications:    allopurinol (ZYLOPRIM) 100 MG tablet, Take 1 tablet by mouth daily as needed., Disp: , Rfl:    ALPRAZolam (XANAX) 0.5 MG tablet, Take 0.5 mg by mouth 3 (three) times daily., Disp: , Rfl:    apixaban (ELIQUIS) 5 MG TABS tablet, Take 10 mg by mouth twice daily, then on 08/17/2023-switch to 5 mg by mouth twice daily., Disp: 180 tablet, Rfl: 3   azaTHIOprine (IMURAN) 50 MG tablet, Take 1 tablet by mouth daily., Disp: , Rfl:    Bempedoic Acid (NEXLETOL) 180 MG TABS, TAKE 1 TABLET BY MOUTH DAILY, Disp: 90 tablet, Rfl: 3   empagliflozin (JARDIANCE) 25 MG TABS tablet, Take 1 tablet (25 mg total) by mouth daily before breakfast., Disp: 90 tablet, Rfl: 3   GEMTESA 75 MG TABS, Take 1 tablet by mouth daily., Disp: , Rfl:    icosapent Ethyl (VASCEPA) 1 g capsule, TAKE 2 CAPSULES(2 GRAMS) BY MOUTH TWICE DAILY, Disp: 360 capsule, Rfl: 3   metFORMIN (GLUCOPHAGE-XR) 500 MG 24 hr tablet, Take 2 tablets (1,000 mg total) by mouth daily with breakfast., Disp: 180 tablet, Rfl: 3   metoprolol succinate (TOPROL-XL) 100 MG 24 hr tablet, Take 1 tablet (100 mg total) by mouth daily. Take with or immediately following a meal., Disp: 90 tablet, Rfl: 3   nitroGLYCERIN  (NITROSTAT) 0.4 MG SL tablet, Place 1 tablet (0.4 mg total) under the tongue every 5 (five) minutes as needed for chest pain., Disp: 25 tablet, Rfl: 2   ONETOUCH VERIO test strip, CHECK BLOOD SUGAR TID AS DIRECTED, Disp: , Rfl:    pantoprazole (PROTONIX) 40 MG tablet, Take 1 tablet (40 mg total) by mouth daily., Disp: 30 tablet, Rfl: 0   PRALUENT 150 MG/ML SOAJ, ADMINISTER 1 ML UNDER THE SKIN EVERY 14 DAYS, Disp: 6 mL, Rfl: 3   spironolactone (ALDACTONE) 25 MG tablet, TAKE 1 TABLET BY MOUTH EVERY DAY, Disp: 90 tablet, Rfl: 2  Past Medical History: Past Medical History:  Diagnosis Date   Acute bronchitis    Allergic rhinitis, cause unspecified    Anemia    Anxiety    B12 deficiency    BV (bacterial vaginosis) 06/22/1996   Calculus of kidney    Calculus of kidney    Coronary atherosclerosis of unspecified type of vessel, native or graft    Dizziness    Essential hypertension, benign    Fibroid 2003   Fibromyalgia    H/O dysmenorrhea 2008   H/O varicella    Headache(784.0)    frequently   HSV-2 infection 2009   Hyperplastic colon polyp 05/16/2014   Irritable bowel syndrome    Meniere's disease, unspecified    Menses, irregular 2003   Myalgia and myositis, unspecified    Myocardial infarction (HCC)    Obstructive sleep apnea (adult) (pediatric)  Perimenopausal symptoms 2003   Pure hypercholesterolemia    Sarcoidosis    Type II or unspecified type diabetes mellitus without mention of complication, not stated as uncontrolled    Unspecified venous (peripheral) insufficiency    Vitamin D deficiency    Vulvitis 2010   Yeast infection     Tobacco Use: Social History   Tobacco Use  Smoking Status Never  Smokeless Tobacco Never  Tobacco Comments   Daily Caffeine - 1  Exercise 2-3 times/weekly    Labs: Review Flowsheet  More data exists      Latest Ref Rng & Units 08/04/2023 08/05/2023 08/06/2023 08/07/2023 12/08/2023  Labs for ITP Cardiac and Pulmonary Rehab  Cholestrol 0 -  200 mg/dL - - - - 161   LDL (calc) 0 - 99 mg/dL - - - - 096   HDL-C >04 mg/dL - - - - 27   Trlycerides <150 mg/dL - - - - 540   PH, Arterial 7.35 - 7.45 7.494  7.184  7.105  6.837  7.445  - - -  PCO2 arterial 32 - 48 mmHg 27.7  20.6  28.2  38.6  36.1  - - -  Bicarbonate 20.0 - 28.0 mmol/L 21.2  8.1  8.9  6.5  24.7  - - -  TCO2 22 - 32 mmol/L 22  9  10  8  26   - - -  Acid-base deficit 0.0 - 2.0 mmol/L 1.0  19.0  19.0  26.0  - - - -  O2 Saturation % 66.7  57.8  100  99  99  96  99  69.2  70.6  79.7  -    Details       Multiple values from one day are sorted in reverse-chronological order         Capillary Blood Glucose: Lab Results  Component Value Date   GLUCAP 104 (H) 01/12/2024   GLUCAP 110 (H) 01/12/2024   GLUCAP 90 01/10/2024   GLUCAP 98 01/10/2024   GLUCAP 115 (H) 08/11/2023     Pulmonary Assessment Scores:  Pulmonary Assessment Scores     Row Name 01/02/24 1423         ADL UCSD   ADL Phase Entry     SOB Score total 39       CAT Score   CAT Score 16       mMRC Score   mMRC Score 3             UCSD: Self-administered rating of dyspnea associated with activities of daily living (ADLs) 6-point scale (0 = "not at all" to 5 = "maximal or unable to do because of breathlessness")  Scoring Scores range from 0 to 120.  Minimally important difference is 5 units  CAT: CAT can identify the health impairment of COPD patients and is better correlated with disease progression.  CAT has a scoring range of zero to 40. The CAT score is classified into four groups of low (less than 10), medium (10 - 20), high (21-30) and very high (31-40) based on the impact level of disease on health status. A CAT score over 10 suggests significant symptoms.  A worsening CAT score could be explained by an exacerbation, poor medication adherence, poor inhaler technique, or progression of COPD or comorbid conditions.  CAT MCID is 2 points  mMRC: mMRC (Modified Medical Research Council)  Dyspnea Scale is used to assess the degree of baseline functional disability in patients of respiratory disease due to  dyspnea. No minimal important difference is established. A decrease in score of 1 point or greater is considered a positive change.   Pulmonary Function Assessment:  Pulmonary Function Assessment - 01/02/24 1338       Breath   Bilateral Breath Sounds Clear    Shortness of Breath Yes;Fear of Shortness of Breath;Limiting activity             Exercise Target Goals: Exercise Program Goal: Individual exercise prescription set using results from initial 6 min walk test and THRR while considering  patient's activity barriers and safety.   Exercise Prescription Goal: Initial exercise prescription builds to 30-45 minutes a day of aerobic activity, 2-3 days per week.  Home exercise guidelines will be given to patient during program as part of exercise prescription that the participant will acknowledge.  Activity Barriers & Risk Stratification:  Activity Barriers & Cardiac Risk Stratification - 01/02/24 1339       Activity Barriers & Cardiac Risk Stratification   Activity Barriers Joint Problems;History of Falls;Deconditioning;Muscular Weakness;Shortness of Breath             6 Minute Walk:  6 Minute Walk     Row Name 01/02/24 1507         6 Minute Walk   Phase Initial     Distance 1060 feet     Walk Time 6 minutes     # of Rest Breaks 2  brief rest 1:57-2:10, 4:42-4:50     MPH 2.01     METS 2.39     RPE 12     Perceived Dyspnea  1     VO2 Peak 8.35     Symptoms Yes (comment)     Comments fatigue     Resting HR 60 bpm     Resting BP 122/64     Resting Oxygen Saturation  100 %     Exercise Oxygen Saturation  during 6 min walk 100 %     Max Ex. HR 89 bpm     Max Ex. BP 136/60     2 Minute Post BP 150/70       Interval HR   1 Minute HR 83     2 Minute HR 89     3 Minute HR 84     4 Minute HR 89     5 Minute HR 84     6 Minute HR 86     2  Minute Post HR 59     Interval Heart Rate? Yes       Interval Oxygen   Interval Oxygen? Yes     Baseline Oxygen Saturation % 100 %     1 Minute Oxygen Saturation % 100 %     1 Minute Liters of Oxygen 0 L     2 Minute Oxygen Saturation % 100 %     2 Minute Liters of Oxygen 0 L     3 Minute Oxygen Saturation % 100 %     3 Minute Liters of Oxygen 0 L     4 Minute Oxygen Saturation % 100 %     4 Minute Liters of Oxygen 0 L     5 Minute Oxygen Saturation % 100 %     5 Minute Liters of Oxygen 0 L     6 Minute Oxygen Saturation % 100 %     6 Minute Liters of Oxygen 0 L     2 Minute Post Oxygen Saturation % 100 %  2 Minute Post Liters of Oxygen 0 L              Oxygen Initial Assessment:  Oxygen Initial Assessment - 01/02/24 1337       Home Oxygen   Home Oxygen Device None    Sleep Oxygen Prescription None    Home Exercise Oxygen Prescription None    Home Resting Oxygen Prescription None      Initial 6 min Walk   Oxygen Used None      Program Oxygen Prescription   Program Oxygen Prescription None      Intervention   Short Term Goals To learn and understand importance of maintaining oxygen saturations>88%;To learn and understand importance of monitoring SPO2 with pulse oximeter and demonstrate accurate use of the pulse oximeter.;To learn and demonstrate proper pursed lip breathing techniques or other breathing techniques.     Long  Term Goals Exhibits proper breathing techniques, such as pursed lip breathing or other method taught during program session;Verbalizes importance of monitoring SPO2 with pulse oximeter and return demonstration;Maintenance of O2 saturations>88%             Oxygen Re-Evaluation:  Oxygen Re-Evaluation     Row Name 01/30/24 0909 02/22/24 1055           Program Oxygen Prescription   Program Oxygen Prescription None None        Home Oxygen   Home Oxygen Device None None      Sleep Oxygen Prescription None None      Home Exercise  Oxygen Prescription None None      Home Resting Oxygen Prescription None None        Goals/Expected Outcomes   Short Term Goals To learn and understand importance of maintaining oxygen saturations>88%;To learn and understand importance of monitoring SPO2 with pulse oximeter and demonstrate accurate use of the pulse oximeter.;To learn and demonstrate proper pursed lip breathing techniques or other breathing techniques.  To learn and understand importance of maintaining oxygen saturations>88%;To learn and understand importance of monitoring SPO2 with pulse oximeter and demonstrate accurate use of the pulse oximeter.;To learn and demonstrate proper pursed lip breathing techniques or other breathing techniques.       Long  Term Goals Exhibits proper breathing techniques, such as pursed lip breathing or other method taught during program session;Verbalizes importance of monitoring SPO2 with pulse oximeter and return demonstration;Maintenance of O2 saturations>88% Exhibits proper breathing techniques, such as pursed lip breathing or other method taught during program session;Verbalizes importance of monitoring SPO2 with pulse oximeter and return demonstration;Maintenance of O2 saturations>88%      Goals/Expected Outcomes Compliance and understanding of oxygen saturations monitoring and breathing techniques to decrease shortness of breath. Compliance and understanding of oxygen saturations monitoring and breathing techniques to decrease shortness of breath.               Oxygen Discharge (Final Oxygen Re-Evaluation):  Oxygen Re-Evaluation - 02/22/24 1055       Program Oxygen Prescription   Program Oxygen Prescription None      Home Oxygen   Home Oxygen Device None    Sleep Oxygen Prescription None    Home Exercise Oxygen Prescription None    Home Resting Oxygen Prescription None      Goals/Expected Outcomes   Short Term Goals To learn and understand importance of maintaining oxygen  saturations>88%;To learn and understand importance of monitoring SPO2 with pulse oximeter and demonstrate accurate use of the pulse oximeter.;To learn and demonstrate proper pursed lip breathing  techniques or other breathing techniques.     Long  Term Goals Exhibits proper breathing techniques, such as pursed lip breathing or other method taught during program session;Verbalizes importance of monitoring SPO2 with pulse oximeter and return demonstration;Maintenance of O2 saturations>88%    Goals/Expected Outcomes Compliance and understanding of oxygen saturations monitoring and breathing techniques to decrease shortness of breath.             Initial Exercise Prescription:  Initial Exercise Prescription - 01/02/24 1500       Date of Initial Exercise RX and Referring Provider   Date 01/02/24    Referring Provider Ramaswamy    Expected Discharge Date 03/29/24      NuStep   Level 1    SPM 60    Minutes 15    METs 2      Recumbant Elliptical   Level 1    RPM 60    Minutes 15    METs 2      Prescription Details   Frequency (times per week) 2    Duration Progress to 30 minutes of continuous aerobic without signs/symptoms of physical distress      Intensity   THRR 40-80% of Max Heartrate 60-121    Ratings of Perceived Exertion 11-13    Perceived Dyspnea 0-4      Progression   Progression Continue to progress workloads to maintain intensity without signs/symptoms of physical distress.      Resistance Training   Training Prescription Yes    Weight yellow bands    Reps 10-15             Perform Capillary Blood Glucose checks as needed.  Exercise Prescription Changes:   Exercise Prescription Changes     Row Name 01/17/24 1100 01/31/24 1100 02/14/24 1100 02/28/24 1200       Response to Exercise   Blood Pressure (Admit) 110/74 112/58 110/64 108/60    Blood Pressure (Exercise) 130/70 102/60 112/60 132/72    Blood Pressure (Exit) 108/60 106/58 100/60 105/62    Heart  Rate (Admit) 84 bpm 84 bpm 80 bpm 78 bpm    Heart Rate (Exercise) 84 bpm 89 bpm 89 bpm 94 bpm    Heart Rate (Exit) 76 bpm 74 bpm 82 bpm 83 bpm    Oxygen Saturation (Admit) 96 % 99 % 100 % 98 %    Oxygen Saturation (Exercise) 96 % 98 % 98 % 98 %    Oxygen Saturation (Exit) 97 % 97 % 99 % 98 %    Rating of Perceived Exertion (Exercise) 14 14 12 13     Perceived Dyspnea (Exercise) 2 2 1 1     Duration Continue with 30 min of aerobic exercise without signs/symptoms of physical distress. Continue with 30 min of aerobic exercise without signs/symptoms of physical distress. Continue with 30 min of aerobic exercise without signs/symptoms of physical distress. Continue with 30 min of aerobic exercise without signs/symptoms of physical distress.    Intensity THRR unchanged THRR unchanged THRR unchanged THRR unchanged      Progression   Progression Continue to progress workloads to maintain intensity without signs/symptoms of physical distress. Continue to progress workloads to maintain intensity without signs/symptoms of physical distress. Continue to progress workloads to maintain intensity without signs/symptoms of physical distress. Continue to progress workloads to maintain intensity without signs/symptoms of physical distress.      Resistance Training   Training Prescription Yes Yes Yes Yes    Weight yellow bands yellow bands red  bands red bands    Reps 10-15 10-15 10-15 10-15    Time 10 Minutes 10 Minutes 10 Minutes 10 Minutes      Recumbant Bike   Level 2 2 3 3     RPM -- 42 26 37    Watts -- 11 12 17     Minutes 15 15 15 15     METs 1.7 1.8 1.9 2.2      NuStep   Level 2 3 3 3     SPM 67 69 91 110    Minutes 15 15 15 15     METs 1.6 1.8 1.9 1.9             Exercise Comments:   Exercise Comments     Row Name 01/10/24 1515           Exercise Comments Naidelin completed first day of exercise. She exercised for 15 min on the recumbent bike and Nustep. Keamber averaged 1.2 METs at level  1 on the recumbent bike and 1.3 METs at level 1 on the Nustep. She performed the warmup and cooldown standing with a few verbal cues. Discussed METs.                Exercise Goals and Review:   Exercise Goals     Row Name 01/02/24 1339             Exercise Goals   Increase Physical Activity Yes       Intervention Provide advice, education, support and counseling about physical activity/exercise needs.;Develop an individualized exercise prescription for aerobic and resistive training based on initial evaluation findings, risk stratification, comorbidities and participant's personal goals.       Expected Outcomes Short Term: Attend rehab on a regular basis to increase amount of physical activity.;Long Term: Exercising regularly at least 3-5 days a week.;Long Term: Add in home exercise to make exercise part of routine and to increase amount of physical activity.       Increase Strength and Stamina Yes       Intervention Provide advice, education, support and counseling about physical activity/exercise needs.;Develop an individualized exercise prescription for aerobic and resistive training based on initial evaluation findings, risk stratification, comorbidities and participant's personal goals.       Expected Outcomes Short Term: Increase workloads from initial exercise prescription for resistance, speed, and METs.;Short Term: Perform resistance training exercises routinely during rehab and add in resistance training at home;Long Term: Improve cardiorespiratory fitness, muscular endurance and strength as measured by increased METs and functional capacity ( )       Able to understand and use rate of perceived exertion (RPE) scale Yes       Intervention Provide education and explanation on how to use RPE scale       Expected Outcomes Short Term: Able to use RPE daily in rehab to express subjective intensity level;Long Term:  Able to use RPE to guide intensity level when exercising  independently       Able to understand and use Dyspnea scale Yes       Intervention Provide education and explanation on how to use Dyspnea scale       Expected Outcomes Short Term: Able to use Dyspnea scale daily in rehab to express subjective sense of shortness of breath during exertion;Long Term: Able to use Dyspnea scale to guide intensity level when exercising independently       Knowledge and understanding of Target Heart Rate Range (THRR) Yes  Intervention Provide education and explanation of THRR including how the numbers were predicted and where they are located for reference       Expected Outcomes Short Term: Able to state/look up THRR;Short Term: Able to use daily as guideline for intensity in rehab;Long Term: Able to use THRR to govern intensity when exercising independently       Understanding of Exercise Prescription Yes       Intervention Provide education, explanation, and written materials on patient's individual exercise prescription       Expected Outcomes Short Term: Able to explain program exercise prescription;Long Term: Able to explain home exercise prescription to exercise independently                Exercise Goals Re-Evaluation :  Exercise Goals Re-Evaluation     Row Name 01/03/24 0746 01/30/24 0906 02/22/24 1051         Exercise Goal Re-Evaluation   Exercise Goals Review Increase Physical Activity;Able to understand and use Dyspnea scale;Understanding of Exercise Prescription;Increase Strength and Stamina;Knowledge and understanding of Target Heart Rate Range (THRR);Able to understand and use rate of perceived exertion (RPE) scale Increase Physical Activity;Able to understand and use Dyspnea scale;Understanding of Exercise Prescription;Increase Strength and Stamina;Knowledge and understanding of Target Heart Rate Range (THRR);Able to understand and use rate of perceived exertion (RPE) scale Increase Physical Activity;Able to understand and use Dyspnea  scale;Understanding of Exercise Prescription;Increase Strength and Stamina;Knowledge and understanding of Target Heart Rate Range (THRR);Able to understand and use rate of perceived exertion (RPE) scale     Comments Pt to begin exercise 01/10/24. Will monitor for progressions. Denyla has completed 6 exercise sessions. She exercises for 15 min on the recumbent bike and Nustep. Aamilah averages 2.0 METs at level 2 on the recumbent bike and 1.7 METs at level 2 on the Nustep. Athene has increased her level for both exericse modes as METs have slightly increased. Will continue to monitor and progress as able. Elysabeth has completed 12 exercise sessions. She exercises for 15 min on the recumbent bike and Nustep. Gladiola averages 2.3 METs at level 3 on the recumbent bike and 1.9 METs at level 3 on the Nustep. Shaima has increased her level for both exericse modes. She tolerates progressions well thus far. I have not discussed home exercise with her yet, but I plan to soon. Will continue to monitor and progress as able.     Expected Outcomes Through exercise at rehab and home, the patient will decrease shortness of breath with daily activities and feel confident in carrying out an exercise regimen at home Through exercise at rehab and home, the patient will decrease shortness of breath with daily activities and feel confident in carrying out an exercise regimen at home Through exercise at rehab and home, the patient will decrease shortness of breath with daily activities and feel confident in carrying out an exercise regimen at home              Discharge Exercise Prescription (Final Exercise Prescription Changes):  Exercise Prescription Changes - 02/28/24 1200       Response to Exercise   Blood Pressure (Admit) 108/60    Blood Pressure (Exercise) 132/72    Blood Pressure (Exit) 105/62    Heart Rate (Admit) 78 bpm    Heart Rate (Exercise) 94 bpm    Heart Rate (Exit) 83 bpm    Oxygen Saturation (Admit) 98 %     Oxygen Saturation (Exercise) 98 %    Oxygen Saturation (Exit)  98 %    Rating of Perceived Exertion (Exercise) 13    Perceived Dyspnea (Exercise) 1    Duration Continue with 30 min of aerobic exercise without signs/symptoms of physical distress.    Intensity THRR unchanged      Progression   Progression Continue to progress workloads to maintain intensity without signs/symptoms of physical distress.      Resistance Training   Training Prescription Yes    Weight red bands    Reps 10-15    Time 10 Minutes      Recumbant Bike   Level 3    RPM 37    Watts 17    Minutes 15    METs 2.2      NuStep   Level 3    SPM 110    Minutes 15    METs 1.9             Nutrition:  Target Goals: Understanding of nutrition guidelines, daily intake of sodium 1500mg , cholesterol 200mg , calories 30% from fat and 7% or less from saturated fats, daily to have 5 or more servings of fruits and vegetables.  Biometrics:  Pre Biometrics - 01/02/24 1507       Pre Biometrics   Grip Strength 11 kg              Nutrition Therapy Plan and Nutrition Goals:  Nutrition Therapy & Goals - 02/24/24 1456       Nutrition Therapy   Diet Heart Healthy diet      Personal Nutrition Goals   Nutrition Goal Patient to improve diet quality by using the plate method as a guide for meal planning to include lean protein/plant protein, fruits, vegetables, whole grains, nonfat dairy as part of a well-balanced diet.    Comments Goals in progress. Keslee has medical history of sarcoidosis, CAD, DM2, pulmonary embolism, hyperlipidemia. She continues regular follow-up with cardiology; lipid panel is not at goal. 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 A1c remains well controlled. She is up 1.7# since starting with our program. Answered patient questions and gave resources for easy, grab and go breakfasts.  Patient will benefit from participation in pulmonary rehab for  nutrition, exercise, and lifestyle modification.      Intervention Plan   Intervention Prescribe, educate and counsel regarding individualized specific dietary modifications aiming towards targeted core components such as weight, hypertension, lipid management, diabetes, heart failure and other comorbidities.;Nutrition handout(s) given to patient.    Expected Outcomes Short Term Goal: Understand basic principles of dietary content, such as calories, fat, sodium, cholesterol and nutrients.;Long Term Goal: Adherence to prescribed nutrition plan.             Nutrition Assessments:  MEDIFICTS Score Key: >=70 Need to make dietary changes  40-70 Heart Healthy Diet <= 40 Therapeutic Level Cholesterol Diet   Picture Your Plate Scores: <16 Unhealthy dietary pattern with much room for improvement. 41-50 Dietary pattern unlikely to meet recommendations for good health and room for improvement. 51-60 More healthful dietary pattern, with some room for improvement.  >60 Healthy dietary pattern, although there may be some specific behaviors that could be improved.    Nutrition Goals Re-Evaluation:  Nutrition Goals Re-Evaluation     Row Name 01/25/24 1050 02/24/24 1456           Goals   Current Weight 148 lb 13 oz (67.5 kg) 149 lb 14.6 oz (68 kg)      Comment cholesterol 217,  triglycerides 245, HDL 27, LDL 141 no new labs; most recent labs  cholesterol 217, triglycerides 245, HDL 27, LDL 141      Expected Outcome Goals in progress. Aubrionna has medical history of sarcoidosis, CAD, DM2, pulmonary embolism, hyperlipidemia. She continues regular follow-up with cardiology; lipid panel is not at goal. 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 A1c remains well controlled. She has maintained her weight since starting with our program. Patient will benefit from participation in pulmonary rehab for nutrition, exercise, and lifestyle modification. Goals in  progress. Carrieann has medical history of sarcoidosis, CAD, DM2, pulmonary embolism, hyperlipidemia. She continues regular follow-up with cardiology; lipid panel is not at goal. 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 A1c remains well controlled. She is up 1.7# since starting with our program. Answered patient questions and gave resources for easy, grab and go breakfasts. Patient will benefit from participation in pulmonary rehab for nutrition, exercise, and lifestyle modification.               Nutrition Goals Discharge (Final Nutrition Goals Re-Evaluation):  Nutrition Goals Re-Evaluation - 02/24/24 1456       Goals   Current Weight 149 lb 14.6 oz (68 kg)    Comment no new labs; most recent labs  cholesterol 217, triglycerides 245, HDL 27, LDL 141    Expected Outcome Goals in progress. Aven has medical history of sarcoidosis, CAD, DM2, pulmonary embolism, hyperlipidemia. She continues regular follow-up with cardiology; lipid panel is not at goal. 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 A1c remains well controlled. She is up 1.7# since starting with our program. Answered patient questions and gave resources for easy, grab and go breakfasts. Patient will benefit from participation in pulmonary rehab for nutrition, exercise, and lifestyle modification.             Psychosocial: Target Goals: Acknowledge presence or absence of significant depression and/or stress, maximize coping skills, provide positive support system. Participant is able to verbalize types and ability to use techniques and skills needed for reducing stress and depression.  Initial Review & Psychosocial Screening:  Initial Psych Review & Screening - 01/02/24 1334       Initial Review   Current issues with None Identified      Family Dynamics   Good Support System? Yes      Barriers   Psychosocial barriers to participate in  program There are no identifiable barriers or psychosocial needs.      Screening Interventions   Interventions Encouraged to exercise    Expected Outcomes Short Term goal: Utilizing psychosocial counselor, staff and physician to assist with identification of specific Stressors or current issues interfering with healing process. Setting desired goal for each stressor or current issue identified.;Long Term Goal: Stressors or current issues are controlled or eliminated.;Short Term goal: Identification and review with participant of any Quality of Life or Depression concerns found by scoring the questionnaire.;Long Term goal: The participant improves quality of Life and PHQ9 Scores as seen by post scores and/or verbalization of changes             Quality of Life Scores:  Scores of 19 and below usually indicate a poorer quality of life in these areas.  A difference of  2-3 points is a clinically meaningful difference.  A difference of 2-3 points in the total score of the Quality of Life Index has been associated with significant  improvement in overall quality of life, self-image, physical symptoms, and general health in studies assessing change in quality of life.  PHQ-9: Review Flowsheet  More data may exist      01/02/2024 09/03/2020 10/25/2017 07/20/2017 12/03/2014  Depression screen PHQ 2/9  Decreased Interest 0 0 0 0 0  Down, Depressed, Hopeless 0 0 0 0 0  PHQ - 2 Score 0 0 0 0 0  Altered sleeping 1 - - - -  Tired, decreased energy 0 - - - -  Change in appetite 0 - - - -  Feeling bad or failure about yourself  0 - - - -  Trouble concentrating 0 - - - -  Moving slowly or fidgety/restless 0 - - - -  Suicidal thoughts 0 - - - -  PHQ-9 Score 1 - - - -  Difficult doing work/chores Not difficult at all - - - -   Interpretation of Total Score  Total Score Depression Severity:  1-4 = Minimal depression, 5-9 = Mild depression, 10-14 = Moderate depression, 15-19 = Moderately severe  depression, 20-27 = Severe depression   Psychosocial Evaluation and Intervention:  Psychosocial Evaluation - 01/02/24 1335       Psychosocial Evaluation & Interventions   Interventions Encouraged to exercise with the program and follow exercise prescription    Comments Sanaa denies any psychosocial needs at this time    Expected Outcomes Clancy will be free of any psy/soc barriers or concerns and will exhibit positive outlook with good coping skills.    Continue Psychosocial Services  No Follow up required             Psychosocial Re-Evaluation:  Psychosocial Re-Evaluation     Row Name 01/03/24 1610 01/25/24 1114 02/20/24 1412         Psychosocial Re-Evaluation   Current issues with None Identified None Identified None Identified     Comments No changes since orientation. Dereonna is scheduled to start the program on 01/10/24 Kimari denies any new psychosocial barriers or concerns at this time. Dynisha denies any new psychosocial barriers or concerns at this time.     Expected Outcomes Krystale will exhibit positive outlook wtih good coping skills and be free of any psy/soc barriers or concerns For Caterine to participate in PR free of any psychosocial barriers or concerns For Annetta to participate in PR free of any psychosocial barriers or concerns     Interventions Encouraged to attend Pulmonary Rehabilitation for the exercise Encouraged to attend Pulmonary Rehabilitation for the exercise Encouraged to attend Pulmonary Rehabilitation for the exercise     Continue Psychosocial Services  No Follow up required No Follow up required No Follow up required              Psychosocial Discharge (Final Psychosocial Re-Evaluation):  Psychosocial Re-Evaluation - 02/20/24 1412       Psychosocial Re-Evaluation   Current issues with None Identified    Comments Arah denies any new psychosocial barriers or concerns at this time.    Expected Outcomes For Tinsleigh to participate in PR free of  any psychosocial barriers or concerns    Interventions Encouraged to attend Pulmonary Rehabilitation for the exercise    Continue Psychosocial Services  No Follow up required             Education: Education Goals: Education classes will be provided on a weekly basis, covering required topics. Participant will state understanding/return demonstration of topics presented.  Learning Barriers/Preferences:  Learning Barriers/Preferences - 01/02/24  1529       Learning Barriers/Preferences   Learning Barriers Sight    Learning Preferences Pictoral;Group Instruction;Individual Instruction;Verbal Instruction;Written Material             Education Topics: Know Your Numbers Group instruction that is supported by a PowerPoint presentation. Instructor discusses importance of knowing and understanding resting, exercise, and post-exercise oxygen saturation, heart rate, and blood pressure. Oxygen saturation, heart rate, blood pressure, rating of perceived exertion, and dyspnea are reviewed along with a normal range for these values.    Exercise for the Pulmonary Patient Group instruction that is supported by a PowerPoint presentation. Instructor discusses benefits of exercise, core components of exercise, frequency, duration, and intensity of an exercise routine, importance of utilizing pulse oximetry during exercise, safety while exercising, and options of places to exercise outside of rehab.  Flowsheet Row PULMONARY REHAB OTHER RESPIRATORY from 02/09/2024 in [facility]  Date 02/09/24  Educator EP  Instruction Review Code 1- Verbalizes Understanding       MET Level  Group instruction provided by PowerPoint, verbal discussion, and written material to support subject matter. Instructor reviews what METs are and how to increase METs.    Pulmonary Medications Verbally interactive group education provided by instructor with focus  on inhaled medications and proper administration. Flowsheet Row PULMONARY REHAB OTHER RESPIRATORY from 02/02/2024 in [facility]  Date 02/02/24  Educator RT  Instruction Review Code 1- Verbalizes Understanding       Anatomy and Physiology of the Respiratory System Group instruction provided by PowerPoint, verbal discussion, and written material to support subject matter. Instructor reviews respiratory cycle and anatomical components of the respiratory system and their functions. Instructor also reviews differences in obstructive and restrictive respiratory diseases with examples of each.  Flowsheet Row PULMONARY REHAB OTHER RESPIRATORY from 01/19/2024 in [facility], [facility]  Date 01/19/24  Educator RT  Instruction Review Code 1- Verbalizes Understanding       Oxygen Safety Group instruction provided by PowerPoint, verbal discussion, and written material to support subject matter. There is an overview of "What is Oxygen" and "Why do we need it".  Instructor also reviews how to create a safe environment for oxygen use, the importance of using oxygen as prescribed, and the risks of noncompliance. There is a brief discussion on traveling with oxygen and resources the patient may utilize. Flowsheet Row PULMONARY REHAB OTHER RESPIRATORY from 02/23/2024 in [facility], [facility]  Date 02/23/24  Educator RN  Instruction Review Code 1- Verbalizes Understanding       Oxygen Use Group instruction provided by PowerPoint, verbal discussion, and written material to discuss how supplemental oxygen is prescribed and different types of oxygen supply systems. Resources for more information are provided.    Breathing Techniques Group instruction that is supported by demonstration and informational handouts. Instructor discusses the benefits of pursed  lip and diaphragmatic breathing and detailed demonstration on how to perform both.     Risk Factor Reduction Group instruction that is supported by a PowerPoint presentation. Instructor discusses the definition of a risk factor, different risk factors for pulmonary disease, and how the heart and lungs work together.   Pulmonary Diseases Group instruction provided by PowerPoint, verbal discussion, and written material to support subject matter. Instructor gives an overview of the different type of pulmonary  diseases. There is also a discussion on risk factors and symptoms as well as ways to manage the diseases. Flowsheet Row PULMONARY REHAB OTHER RESPIRATORY from 01/12/2024 in [facility], [facility]  Date 01/12/24  Educator RT  Instruction Review Code 1- Verbalizes Understanding       Stress and Energy Conservation Group instruction provided by PowerPoint, verbal discussion, and written material to support subject matter. Instructor gives an overview of stress and the impact it can have on the body. Instructor also reviews ways to reduce stress. There is also a discussion on energy conservation and ways to conserve energy throughout the day.   Warning Signs and Symptoms Group instruction provided by PowerPoint, verbal discussion, and written material to support subject matter. Instructor reviews warning signs and symptoms of stroke, heart attack, cold and flu. Instructor also reviews ways to prevent the spread of infection.   Other Education Group or individual verbal, written, or video instructions that support the educational goals of the pulmonary rehab program. Flowsheet Row PULMONARY REHAB OTHER RESPIRATORY from 01/26/2024 in [facility], V[facility], & Lung Health  Date 01/26/24  Educator EP  Instruction Review Code 1- Verbalizes Understanding        Knowledge Questionnaire Score:  Knowledge  Questionnaire Score - 01/02/24 1425       Knowledge Questionnaire Score   Pre Score 16/18             Core Components/Risk Factors/Patient Goals at Admission:  Personal Goals and Risk Factors at Admission - 01/02/24 1434       Core Components/Risk Factors/Patient Goals on Admission   Improve shortness of breath with ADL's Yes    Intervention Provide education, individualized exercise plan and daily activity instruction to help decrease symptoms of SOB with activities of daily living.    Expected Outcomes Short Term: Improve cardiorespiratory fitness to achieve a reduction of symptoms when performing ADLs;Long Term: Be able to perform more ADLs without symptoms or delay the onset of symptoms             Core Components/Risk Factors/Patient Goals Review:   Goals and Risk Factor Review     Row Name 01/03/24 1612 01/25/24 1116 02/20/24 1412         Core Components/Risk Factors/Patient Goals Review   Personal Goals Review Improve shortness of breath with ADL's;Develop more efficient breathing techniques such as purse lipped breathing and diaphragmatic breathing and practicing self-pacing with activity. Improve shortness of breath with ADL's;Develop more efficient breathing techniques such as purse lipped breathing and diaphragmatic breathing and practicing self-pacing with activity. Improve shortness of breath with ADL's;Develop more efficient breathing techniques such as purse lipped breathing and diaphragmatic breathing and practicing self-pacing with activity.     Review Unable to assess, pt has not started the program yet. Mendy is scheduled to start the program next week Goal progressing for improving shortness of breath with ADL's. Lilias is currently able to maintain sats >88% on RA while exercising. She is currently exercising on the recumbant bike and the Nustep. She is gradually increasing her MET's. Goal progressing for developing more efficient breathing techniques such as  purse lipped breathing and diaphragmatic breathing; and practicing self-pacing with activity. We will continue to monitor Rexanna's progress throughout the program. Monthly review of patient's Core Components/Risk Factors/Patient Goals are as follows: Goal progressing for improving shortness of breath with ADL's. Jacquline is currently able to maintain sats >88% on RA while  exercising. She is currently exercising on the recumbant bike and the Nustep. Goal met for developing more efficient breathing techniques such as purse lipped breathing and diaphragmatic breathing; and practicing self-pacing with activity. She is able to demonstrate purse lip breathing when she gets short of breath. She also knows how to pace herself based on her rate of perceived exertion score. We will continue to monitor Leianne's progress throughout the program.     Expected Outcomes Verna will improve her shortness of breath with ADLs and develop more efficient breathing techniques such as purse lipped breathing and diaphragmatic breathing; and practicing self-pacing with activity Veda will improve her shortness of breath with ADLs and develop more efficient breathing techniques such as purse lipped breathing and diaphragmatic breathing; and practicing self-pacing with activity To  improve her shortness of breath with ADLs              Core Components/Risk Factors/Patient Goals at Discharge (Final Review):   Goals and Risk Factor Review - 02/20/24 1412       Core Components/Risk Factors/Patient Goals Review   Personal Goals Review Improve shortness of breath with ADL's;Develop more efficient breathing techniques such as purse lipped breathing and diaphragmatic breathing and practicing self-pacing with activity.    Review Monthly review of patient's Core Components/Risk Factors/Patient Goals are as follows: Goal progressing for improving shortness of breath with ADL's. Vaudie is currently able to maintain sats >88% on RA while  exercising. She is currently exercising on the recumbant bike and the Nustep. Goal met for developing more efficient breathing techniques such as purse lipped breathing and diaphragmatic breathing; and practicing self-pacing with activity. She is able to demonstrate purse lip breathing when she gets short of breath. She also knows how to pace herself based on her rate of perceived exertion score. We will continue to monitor Maisee's progress throughout the program.    Expected Outcomes To  improve her shortness of breath with ADLs             ITP Comments: Pt is making expected progress toward Pulmonary Rehab goals after completing 14 session(s). Recommend continued exercise, life style modification, education, and utilization of breathing techniques to increase stamina and strength, while also decreasing shortness of breath with exertion.  Dr. Genetta Kenning is Medical Director for Pulmonary Rehab at [facility].

## 2024-02-29 NOTE — Progress Notes (Unsigned)
 Paramedicine Encounter    Patient ID: Jocelyn [name], female    DOB: 11/28/1955, 68 y.o.   MRN: [id number]   Complaints-none   Edema-none   Compliance with meds-yes   Pill box filled-she fills  If so, by whom-patient   Refills needed-none    Pt reports she is doing good. She went to lipid clinic since I seen her last. She is due for labs in a few wks to check those lipids.  She reports getting tired at times and has to take a nap.  She is doing pulm rehab, she is tired when she leaves there and takes a nap, but she is doing ok.  She is sch for a few more wks of pulm rehab and not sure if she will get reassessed for more sessions or not.   Her synjardy got split up into the jardiance and metformin due to insurance reasons.  She is using her 02 at night time and feels that it is helping.  Not having to use it other times due to sob.   She reports her breathing is doing ok.  She has been waking up with dry nose a lot- I advised her to message her pulm doc that prescribed the 02 to put in an order for humidified 02 bottle.  Denies abd pains, no c/p. No dizziness.  Pt denies any edema noted-none seen.  No issues with refills at the moment. Think she has met her deductible as her last expensive med was $0.    BP 112/62   Pulse 68   Resp 16   Wt 147 lb (66.7 kg)   SpO2 98%   BMI 26.89 kg/m  Weight yesterday-? Last visit weight-148   Patient Care Team: Renaye Rakers, MD as PCP - General (Family Medicine) Kathleene Hazel, MD as PCP - Cardiology (Cardiology)  Patient Active Problem List   Diagnosis Date Noted   Physical deconditioning 08/23/2023   Acute respiratory failure with hypoxia (HCC) 08/05/2023   Pulmonary embolism (HCC) 08/03/2023   Pulmonary edema 08/03/2023   Degenerative arthritis of knee 08/20/2021   Hx of pleural effusion    Dyspnea 08/16/2018   Fall on or from sidewalk curb, initial encounter 05/31/2018   History of acute inferior wall MI 08/22/2017    Diabetes mellitus (HCC)    S/P right coronary artery (RCA) stent placement    Overweight 05/05/2017   Diabetes mellitus type 2, controlled, without complications (HCC) 12/16/2016   Asthmatic bronchitis , chronic (HCC) 05/10/2016   Polymyositis (HCC) 09/15/2015   History of sarcoidosis 09/15/2015   Falling 03/13/2015   Edema 02/13/2014   Coronary artery disease involving native heart without angina pectoris 10/29/2013   Hyperlipidemia 10/29/2013   Iron (Fe) deficiency anemia 08/08/2013   Vitamin B 12 deficiency 08/08/2013   GERD (gastroesophageal reflux disease) 09/08/2011   Weakness 02/22/2011   Depression 02/22/2011   MENIERE'S DISEASE 04/30/2009   Vitamin D deficiency 02/05/2009   Venous (peripheral) insufficiency 12/12/2007   RENAL CALCULUS 12/12/2007   DIZZINESS 12/12/2007   HYPERCHOLESTEROLEMIA 09/16/2007   Anxiety state 09/16/2007   HYPERTENSION, BENIGN 09/16/2007   Allergic rhinitis 09/16/2007   IRRITABLE BOWEL SYNDROME 09/16/2007   Myalgia and myositis 09/16/2007    Current Outpatient Medications:    allopurinol (ZYLOPRIM) 100 MG tablet, Take 1 tablet by mouth daily as needed., Disp: , Rfl:    ALPRAZolam (XANAX) 0.5 MG tablet, Take 0.5 mg by mouth 3 (three) times daily., Disp: , Rfl:    apixaban (  ELIQUIS) 5 MG TABS tablet, Take 10 mg by mouth twice daily, then on 08/17/2023-switch to 5 mg by mouth twice daily., Disp: 180 tablet, Rfl: 3   azaTHIOprine (IMURAN) 50 MG tablet, Take 1 tablet by mouth daily., Disp: , Rfl:    Bempedoic Acid (NEXLETOL) 180 MG TABS, TAKE 1 TABLET BY MOUTH DAILY, Disp: 90 tablet, Rfl: 3   empagliflozin (JARDIANCE) 25 MG TABS tablet, Take 1 tablet (25 mg total) by mouth daily before breakfast., Disp: 90 tablet, Rfl: 3   GEMTESA 75 MG TABS, Take 1 tablet by mouth daily., Disp: , Rfl:    icosapent Ethyl (VASCEPA) 1 g capsule, TAKE 2 CAPSULES(2 GRAMS) BY MOUTH TWICE DAILY, Disp: 360 capsule, Rfl: 3   metFORMIN (GLUCOPHAGE-XR) 500 MG 24 hr tablet,  Take 2 tablets (1,000 mg total) by mouth daily with breakfast., Disp: 180 tablet, Rfl: 3   metoprolol succinate (TOPROL-XL) 100 MG 24 hr tablet, Take 1 tablet (100 mg total) by mouth daily. Take with or immediately following a meal., Disp: 90 tablet, Rfl: 3   nitroGLYCERIN (NITROSTAT) 0.4 MG SL tablet, Place 1 tablet (0.4 mg total) under the tongue every 5 (five) minutes as needed for chest pain., Disp: 25 tablet, Rfl: 2   ONETOUCH VERIO test strip, CHECK BLOOD SUGAR TID AS DIRECTED, Disp: , Rfl:    pantoprazole (PROTONIX) 40 MG tablet, Take 1 tablet (40 mg total) by mouth daily., Disp: 30 tablet, Rfl: 0   PRALUENT 150 MG/ML SOAJ, ADMINISTER 1 ML UNDER THE SKIN EVERY 14 DAYS, Disp: 6 mL, Rfl: 3   spironolactone (ALDACTONE) 25 MG tablet, TAKE 1 TABLET BY MOUTH EVERY DAY, Disp: 90 tablet, Rfl: 2 Allergies  Allergen Reactions   Actos [Pioglitazone] Other (See Comments)    REACTION: pt states INTOL w/ edema   Codeine Other (See Comments)    REACTION: vomiting   Januvia [Sitagliptin] Nausea Only   Lactose Intolerance (Gi) Diarrhea   Morphine Nausea Only and Nausea And Vomiting    REACTION: vomiting REACTION: vomiting   Repatha [Evolocumab]     WAS NOT EFFECTIVE IN LOWERING LDL      Social History   Socioeconomic History   Marital status: Single    Spouse name: Not on file   Number of children: 1   Years of education: Not on file   Highest education level: Master's degree (e.g., MA, MS, MEng, MEd, MSW, MBA)  Occupational History   Occupation: Retired Runner, broadcasting/film/video   Tobacco Use   Smoking status: Never   Smokeless tobacco: Never   Tobacco comments:    Daily Caffeine - 1  Exercise 2-3 times/weekly  Vaping Use   Vaping status: Never Used  Substance and Sexual Activity   Alcohol use: No    Alcohol/week: 0.0 standard drinks of alcohol   Drug use: No   Sexual activity: Yes    Birth control/protection: None  Other Topics Concern   Not on file  Social History Narrative   Exercises 2-3  times weekly   Caffeine Use: 1 daily (tea or Pepsi)   Lives alone in a one story home.  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  Retired Midwife.     Social Drivers of Corporate investment banker Strain: Low Risk  (08/05/2023)   Overall Financial Resource Strain (CARDIA)    Difficulty of Paying Living Expenses: Not hard at all  Food Insecurity: No Food Insecurity (08/05/2023)   Hunger Vital Sign    Worried About Running Out of Food in the Last  Year: Never true    Ran Out of Food in the Last Year: Never true  Transportation Needs: No Transportation Needs (08/05/2023)   PRAPARE - Administrator, Civil Service (Medical): No    Lack of Transportation (Non-Medical): No  Physical Activity: Not on file  Stress: No Stress Concern Present (08/05/2023)   [employer] of Occupational Health - Occupational Stress Questionnaire    Feeling of Stress : Not at all  Social Connections: Not on file  Intimate Partner Violence: Not At Risk (08/05/2023)   Humiliation, Afraid, Rape, and Kick questionnaire    Fear of Current or Ex-Partner: No    Emotionally Abused: No    Physically Abused: No    Sexually Abused: No    Physical Exam      Future Appointments  Date Time Provider Department Center  03/01/2024 10:15 AM [facility]ER[facility]AD [facility] None  03/06/2024 10:15 AM [facility] None  03/08/2024 10:15 AM [facility] None  03/13/2024 10:15 AM [facility] None  03/15/2024 10:15 AM [facility]AD [facility] None  03/20/2024 10:15 AM [facility] None  03/22/2024 10:15 AM [facility] None  03/27/2024 10:15 AM [facility] None  03/29/2024 10:15 AM [facility] None  06/06/2024  2:00 PM [facility] PA/NP [facility] None  07/30/2024  1:45 PM Cassandra Cleveland, MD [facility] None       Alfonza Angry,  Paramedic [phone] [facility] Paramedic  02/29/24

## 2024-03-01 ENCOUNTER — Encounter (HOSPITAL_COMMUNITY)
Admission: RE | Admit: 2024-03-01 | Discharge: 2024-03-01 | Disposition: A | Discharge status: Home / Self Care | Payer: Medicare PPO | Source: Ambulatory Visit | Attending: Internal Medicine | Admitting: Internal Medicine

## 2024-03-01 DIAGNOSIS — I2699 Other pulmonary embolism without acute cor pulmonale: Secondary | ICD-10-CM | POA: Diagnosis not present

## 2024-03-01 DIAGNOSIS — J9611 Chronic respiratory failure with hypoxia: Secondary | ICD-10-CM

## 2024-03-01 NOTE — Progress Notes (Signed)
 Daily Session Note  Patient Details  Name: Jocelyn [name] MRN: [id number] Date of Birth: 1956/02/04 Referring Provider:   Gattis Kass Pulmonary Rehab Walk Test from 01/02/2024 in [facility] for Heart, Vascular, & Lung Health  Referring Provider Ramaswamy       Encounter Date: 03/01/2024  Check In:  Session Check In - 03/01/24 1026       Check-In   Supervising physician immediately available to respond to emergencies [facility] MD immediately available    Physician(s) Charles Connor, NP    Location [facility]    Staff Present Cindra Cree, Emilio Harder, MS, ACSM-CEP, Exercise Physiologist;Randi Rochelle Chu, ACSM-CEP, Exercise Physiologist;Mary Arlester Ladd, RN, Emerick Hanlon, MS, Exercise Physiologist;Johnny Alexia Angelucci, MS, Exercise Physiologist    Virtual Visit No    Medication changes reported     No    Fall or balance concerns reported    No    Tobacco Cessation No Change    Warm-up and Cool-down Performed as group-led instruction    Resistance Training Performed Yes    VAD Patient? No    PAD/SET Patient? No      Pain Assessment   Currently in Pain? No/denies    Multiple Pain Sites No             Capillary Blood Glucose: No results found for this or any previous visit (from the past 24 hours).    Social History   Tobacco Use  Smoking Status Never  Smokeless Tobacco Never  Tobacco Comments   Daily Caffeine - 1  Exercise 2-3 times/weekly    Goals Met:  Proper associated with RPD/PD & O2 Sat Exercise tolerated well No report of concerns or symptoms today Strength training completed today  Goals Unmet:  Not Applicable  Comments: Service time is from 1021 to 1140.    Dr. Genetta Kenning is Medical Director for Pulmonary Rehab at [facility].

## 2024-03-06 ENCOUNTER — Encounter (HOSPITAL_COMMUNITY)
Admission: RE | Admit: 2024-03-06 | Discharge: 2024-03-06 | Disposition: A | Discharge status: Home / Self Care | Payer: Medicare PPO | Source: Ambulatory Visit | Attending: Internal Medicine | Admitting: Internal Medicine

## 2024-03-06 DIAGNOSIS — I2699 Other pulmonary embolism without acute cor pulmonale: Secondary | ICD-10-CM | POA: Diagnosis not present

## 2024-03-06 DIAGNOSIS — J9611 Chronic respiratory failure with hypoxia: Secondary | ICD-10-CM | POA: Diagnosis not present

## 2024-03-06 NOTE — Progress Notes (Signed)
 Daily Session Note  Patient Details  Name: DELCIE RUPPERT MRN: [id number] Date of Birth: [birth year] Referring Provider:   Gattis Kass Pulmonary Rehab Walk Test from 01/02/2024 in [facility] for Heart, Vascular, & Lung Health  Referring Provider Ramaswamy       Encounter Date: 03/06/2024  Check In:  Session Check In - 03/06/24 1437       Check-In   Supervising physician immediately available to respond to emergencies [facility] MD immediately available    Physician(s) Marlana Silvan, NP    Location [facility]    Staff Present Cindra Cree, Emilio Harder, MS, ACSM-CEP, Exercise Physiologist;Randi Rochelle Chu, ACSM-CEP, Exercise Physiologist;Johnny Alexia Angelucci, MS, Exercise Physiologist    Virtual Visit No    Medication changes reported     No    Fall or balance concerns reported    No    Tobacco Cessation No Change    Warm-up and Cool-down Performed as group-led instruction    Resistance Training Performed Yes    VAD Patient? No    PAD/SET Patient? No      Pain Assessment   Currently in Pain? No/denies    Multiple Pain Sites No             Capillary Blood Glucose: No results found for this or any previous visit (from the past 24 hours).    Social History   Tobacco Use  Smoking Status Never  Smokeless Tobacco Never  Tobacco Comments   Daily Caffeine - 1  Exercise 2-3 times/weekly    Goals Met:  Proper associated with RPD/PD & O2 Sat Independence with exercise equipment Exercise tolerated well No report of concerns or symptoms today Strength training completed today  Goals Unmet:  Not Applicable  Comments: Service time is from 1320 to 1443.    Dr. Genetta Kenning is Medical Director for Pulmonary Rehab at [facility]

## 2024-03-07 DIAGNOSIS — Z79899 Other long term (current) drug therapy: Secondary | ICD-10-CM | POA: Diagnosis not present

## 2024-03-07 DIAGNOSIS — E663 Overweight: Secondary | ICD-10-CM | POA: Diagnosis not present

## 2024-03-07 DIAGNOSIS — Z6827 Body mass index (BMI) 27.0-27.9, adult: Secondary | ICD-10-CM | POA: Diagnosis not present

## 2024-03-07 DIAGNOSIS — M3322 Polymyositis with myopathy: Secondary | ICD-10-CM | POA: Diagnosis not present

## 2024-03-07 DIAGNOSIS — M25561 Pain in right knee: Secondary | ICD-10-CM | POA: Diagnosis not present

## 2024-03-08 ENCOUNTER — Encounter (HOSPITAL_COMMUNITY)
Admission: RE | Admit: 2024-03-08 | Discharge: 2024-03-08 | Disposition: A | Discharge status: Home / Self Care | Payer: Medicare PPO | Source: Ambulatory Visit | Attending: Internal Medicine | Admitting: Internal Medicine

## 2024-03-08 VITALS — Wt 149.3 lb

## 2024-03-08 DIAGNOSIS — J9611 Chronic respiratory failure with hypoxia: Secondary | ICD-10-CM | POA: Diagnosis not present

## 2024-03-08 DIAGNOSIS — I2699 Other pulmonary embolism without acute cor pulmonale: Secondary | ICD-10-CM

## 2024-03-08 NOTE — Progress Notes (Signed)
 Daily Session Note  Patient Details  Name: Jocelyn [name] MRN: [id number] Date of Birth: [birth year] Referring Provider:   Gattis Kass Pulmonary Rehab Walk Test from 01/02/2024 in [facility] for Heart, Vascular, & Lung Health  Referring Provider Ramaswamy       Encounter Date: 03/08/2024  Check In:  Session Check In - 03/08/24 1028       Check-In   Supervising physician immediately available to respond to emergencies [facility] MD immediately available    Physician(s) Lawana Pray, NP    Location [facility]    Staff Present Cindra Cree, Emilio Harder, MS, ACSM-CEP, Exercise Physiologist;[place] Rochelle Chu, ACSM-CEP, Exercise Physiologist;Mary Arlester Ladd, RN, BSN    Virtual Visit No    Medication changes reported     No    Fall or balance concerns reported    No    Tobacco Cessation No Change    Warm-up and Cool-down Performed as group-led instruction    Resistance Training Performed Yes    VAD Patient? No    PAD/SET Patient? No      Pain Assessment   Currently in Pain? No/denies    Multiple Pain Sites No             Capillary Blood Glucose: No results found for this or any previous visit (from the past 24 hours).    Social History   Tobacco Use  Smoking Status Never  Smokeless Tobacco Never  Tobacco Comments   Daily Caffeine - 1  Exercise 2-3 times/weekly    Goals Met:  Independence with exercise equipment Exercise tolerated well No report of concerns or symptoms today Strength training completed today  Goals Unmet:  Not Applicable  Comments: Service time is from 1022 to 1138.    Dr. Genetta Kenning is Medical Director for Pulmonary Rehab at [facility].

## 2024-03-09 DIAGNOSIS — Z1231 Encounter for screening mammogram for malignant neoplasm of breast: Secondary | ICD-10-CM | POA: Diagnosis not present

## 2024-03-12 ENCOUNTER — Emergency Department (HOSPITAL_BASED_OUTPATIENT_CLINIC_OR_DEPARTMENT_OTHER)
Admission: EM | Admit: 2024-03-12 | Discharge: 2024-03-12 | Disposition: A | Discharge status: Home / Self Care | Attending: Emergency Medicine | Admitting: Emergency Medicine

## 2024-03-12 ENCOUNTER — Telehealth (HOSPITAL_COMMUNITY): Payer: Self-pay

## 2024-03-12 ENCOUNTER — Encounter (HOSPITAL_BASED_OUTPATIENT_CLINIC_OR_DEPARTMENT_OTHER): Payer: Self-pay

## 2024-03-12 DIAGNOSIS — E1169 Type 2 diabetes mellitus with other specified complication: Secondary | ICD-10-CM | POA: Diagnosis not present

## 2024-03-12 DIAGNOSIS — F064 Anxiety disorder due to known physiological condition: Secondary | ICD-10-CM | POA: Diagnosis not present

## 2024-03-12 DIAGNOSIS — N189 Chronic kidney disease, unspecified: Secondary | ICD-10-CM | POA: Insufficient documentation

## 2024-03-12 DIAGNOSIS — N1832 Chronic kidney disease, stage 3b: Secondary | ICD-10-CM | POA: Diagnosis not present

## 2024-03-12 DIAGNOSIS — I2699 Other pulmonary embolism without acute cor pulmonale: Secondary | ICD-10-CM | POA: Diagnosis not present

## 2024-03-12 DIAGNOSIS — D649 Anemia, unspecified: Secondary | ICD-10-CM | POA: Diagnosis not present

## 2024-03-12 DIAGNOSIS — E875 Hyperkalemia: Secondary | ICD-10-CM | POA: Insufficient documentation

## 2024-03-12 DIAGNOSIS — F339 Major depressive disorder, recurrent, unspecified: Secondary | ICD-10-CM | POA: Diagnosis not present

## 2024-03-12 DIAGNOSIS — E785 Hyperlipidemia, unspecified: Secondary | ICD-10-CM | POA: Diagnosis not present

## 2024-03-12 DIAGNOSIS — I129 Hypertensive chronic kidney disease with stage 1 through stage 4 chronic kidney disease, or unspecified chronic kidney disease: Secondary | ICD-10-CM | POA: Diagnosis not present

## 2024-03-12 DIAGNOSIS — R799 Abnormal finding of blood chemistry, unspecified: Secondary | ICD-10-CM | POA: Diagnosis present

## 2024-03-12 LAB — CBC WITH DIFFERENTIAL/PLATELET
Abs Immature Granulocytes: 0.01 10*3/uL (ref 0.00–0.07)
Basophils Absolute: 0 10*3/uL (ref 0.0–0.1)
Basophils Relative: 1 %
Eosinophils Absolute: 0 10*3/uL (ref 0.0–0.5)
Eosinophils Relative: 1 %
HCT: 31.6 % — ABNORMAL LOW (ref 36.0–46.0)
Hemoglobin: 10.3 g/dL — ABNORMAL LOW (ref 12.0–15.0)
Immature Granulocytes: 0 %
Lymphocytes Relative: 36 %
Lymphs Abs: 1.8 10*3/uL (ref 0.7–4.0)
MCH: 33.2 pg (ref 26.0–34.0)
MCHC: 32.6 g/dL (ref 30.0–36.0)
MCV: 101.9 fL — ABNORMAL HIGH (ref 80.0–100.0)
Monocytes Absolute: 0.8 10*3/uL (ref 0.1–1.0)
Monocytes Relative: 16 %
Neutro Abs: 2.3 10*3/uL (ref 1.7–7.7)
Neutrophils Relative %: 46 %
Platelets: 251 10*3/uL (ref 150–400)
RBC: 3.1 MIL/uL — ABNORMAL LOW (ref 3.87–5.11)
RDW: 21.2 % — ABNORMAL HIGH (ref 11.5–15.5)
WBC: 4.9 10*3/uL (ref 4.0–10.5)
nRBC: 0.4 % — ABNORMAL HIGH (ref 0.0–0.2)

## 2024-03-12 LAB — URINALYSIS, ROUTINE W REFLEX MICROSCOPIC
Bilirubin Urine: NEGATIVE
Glucose, UA: 100 mg/dL — AB
Hgb urine dipstick: NEGATIVE
Ketones, ur: NEGATIVE mg/dL
Leukocytes,Ua: NEGATIVE
Nitrite: NEGATIVE
Protein, ur: NEGATIVE mg/dL
Specific Gravity, Urine: 1.013 (ref 1.005–1.030)
pH: 5.5 (ref 5.0–8.0)

## 2024-03-12 LAB — COMPREHENSIVE METABOLIC PANEL WITH GFR
ALT: 9 U/L (ref 0–44)
AST: 25 U/L (ref 15–41)
Albumin: 4.7 g/dL (ref 3.5–5.0)
Alkaline Phosphatase: 71 U/L (ref 38–126)
Anion gap: 12 (ref 5–15)
BUN: 35 mg/dL — ABNORMAL HIGH (ref 8–23)
CO2: 17 mmol/L — ABNORMAL LOW (ref 22–32)
Calcium: 10.1 mg/dL (ref 8.9–10.3)
Chloride: 111 mmol/L (ref 98–111)
Creatinine, Ser: 1.65 mg/dL — ABNORMAL HIGH (ref 0.44–1.00)
GFR, Estimated: 33 mL/min — ABNORMAL LOW (ref >60)
Glucose, Bld: 89 mg/dL (ref 70–99)
Potassium: 5.7 mmol/L — ABNORMAL HIGH (ref 3.5–5.1)
Sodium: 140 mmol/L (ref 135–145)
Total Bilirubin: 0.4 mg/dL (ref 0.0–1.2)
Total Protein: 7.4 g/dL (ref 6.5–8.1)

## 2024-03-12 MED ORDER — LOKELMA 10 G PO PACK: 10.0000 g | PACK | Freq: Two times a day (BID) | ORAL | 0 refills | Status: AC

## 2024-03-12 MED ORDER — ONDANSETRON HCL 4 MG/2ML IJ SOLN
4.0000 mg | Freq: Once | INTRAMUSCULAR | Status: AC
  Administered 2024-03-12: 4 mg via INTRAVENOUS
  Filled 2024-03-12: qty 2

## 2024-03-12 MED ORDER — SODIUM ZIRCONIUM CYCLOSILICATE 10 G PO PACK
10.0000 g | PACK | Freq: Every day | ORAL | Status: DC
  Administered 2024-03-12: 10 g via ORAL
  Filled 2024-03-12: qty 1

## 2024-03-12 MED ORDER — SODIUM CHLORIDE 0.9 % IV BOLUS: 1000.0000 mL | Freq: Once | INTRAVENOUS | Status: DC

## 2024-03-12 MED ORDER — ONDANSETRON HCL 4 MG PO TABS: 4.0000 mg | ORAL_TABLET | Freq: Four times a day (QID) | ORAL | 0 refills | Status: AC

## 2024-03-12 MED ORDER — SODIUM CHLORIDE 0.9 % IV BOLUS
1000.0000 mL | Freq: Once | INTRAVENOUS | Status: AC
  Administered 2024-03-12: 1000 mL via INTRAVENOUS

## 2024-03-12 NOTE — ED Notes (Signed)
Reviewed discharge instructions, medications, and home care with pt. Pt verbalized understanding and had no further questions. Pt exited ED without complications.

## 2024-03-12 NOTE — ED Triage Notes (Signed)
 Pt was advised by PCP to come to have labs redrawn- potassium was noted to be 6.2 on Wednesday. Pt denies symptoms other than fatigue "for a few days." Also "some" concern for kidney function

## 2024-03-12 NOTE — Telephone Encounter (Signed)
 Patient's daughter reached out to [facility]za Angry paramedicine in relation to her mothers labs that were drawn at another office on 03/08/24. Patient's potassium was 6.2, Hemoglobin of 9.8 and worsing Kidney function. Called the physicians office to get clarification of labs, spoke with a RN at that office and clarified that those labs were correct. No instruction was given to patient to report to ED for a a high potassium, they were more concerned about her hemoglobin. Spoke with [facility] FNP, to get advise on this issue. She stated to ask patient to report to either an urgent care of ED and to stop her spironolactone . Daughter relayed instructions back to me and verbalized understanding

## 2024-03-12 NOTE — ED Provider Notes (Signed)
 [place] EMERGENCY DEPARTMENT AT [facility] Provider Note   CSN: [id number] Arrival date & time: 03/12/24  1754     History Chief Complaint  Patient presents with   Abnormal Lab    Potassium- Wednesday    HPI Jocelyn [name] is a 68 y.o. female presenting for elevated potassium.  Outpatient potassium last week 6.2. Patient was referred to her PCP for further care and management.  However PCP received the referral this morning did not have office space for her and recommended she come straight to the ER for evaluation for hyperkalemia/renal insufficiency.  She has underlying CKD after cardiac arrest secondary to pulmonary embolism a year ago. She has had a very difficult time with her appetite in the outpatient setting with very poor p.o. intake. Patient's recorded medical, surgical, social, medication list and allergies were reviewed in the Snapshot window as part of the initial history.   Review of Systems   Review of Systems  Constitutional:  Positive for fatigue. Negative for chills and fever.  HENT:  Negative for ear pain and sore throat.   Eyes:  Negative for pain and visual disturbance.  Respiratory:  Negative for cough and shortness of breath.   Cardiovascular:  Negative for chest pain and palpitations.  Gastrointestinal:  Negative for abdominal pain and vomiting.  Genitourinary:  Negative for dysuria and hematuria.  Musculoskeletal:  Negative for arthralgias, back pain and myalgias.  Skin:  Negative for color change and rash.  Neurological:  Negative for seizures and syncope.  All other systems reviewed and are negative.   Physical Exam Updated Vital Signs BP (!) 132/95   Pulse 61   Temp 98.4 F (36.9 C)   Resp 17   SpO2 100%  Physical Exam Vitals and nursing note reviewed.  Constitutional:      General: She is not in acute distress.    Appearance: She is well-developed.  HENT:     Head: Normocephalic and atraumatic.  Eyes:      Conjunctiva/sclera: Conjunctivae normal.  Cardiovascular:     Rate and Rhythm: Normal rate and regular rhythm.     Heart sounds: No murmur heard. Pulmonary:     Effort: Pulmonary effort is normal. No respiratory distress.     Breath sounds: Normal breath sounds.  Abdominal:     General: There is no distension.     Palpations: Abdomen is soft.     Tenderness: There is no abdominal tenderness. There is no right CVA tenderness or left CVA tenderness.  Musculoskeletal:        General: No swelling or tenderness. Normal range of motion.     Cervical back: Neck supple.  Skin:    General: Skin is warm and dry.  Neurological:     General: No focal deficit present.     Mental Status: She is alert and oriented to person, place, and time. Mental status is at baseline.     Cranial Nerves: No cranial nerve deficit.      ED Course/ Medical Decision Making/ A&P    Procedures Procedures   Medications Ordered in ED Medications  sodium zirconium cyclosilicate (LOKELMA) packet 10 g (10 g Oral Given 03/12/24 2141)  ondansetron  (ZOFRAN ) injection 4 mg (has no administration in time range)  sodium chloride  0.9 % bolus 1,000 mL (1,000 mLs Intravenous New Bag/Given 03/12/24 2207)    Medical Decision Making:   Jocelyn [name] is a 68 y.o. female who presented to the ED today with chief complaint abnormal  labs detailed above.    Additional history discussed with patient's family/caregivers.  Patient placed on continuous vitals and telemetry monitoring while in ED which was reviewed periodically.  Complete initial physical exam performed, notably the patient  was hemodynamically stable no acute distress.    Reviewed and confirmed nursing documentation for past medical history, family history, social history.    Initial Assessment:   This is most consistent with an acute life/limb threatening illness complicated by underlying chronic conditions. Patient is presenting with hyperkalemia secondary to  renal insufficiency in the outpatient setting. States that the symptoms started when she had been having less to eat or drink than her baseline. She has increased her fluid intake over the past week and was sent in by her PCP for repeat lab draw. She denies any fevers chills nausea vomiting syncope shortness of breath. Overall for history of present illness and physical exam findings are most consistent with likely prerenal AKI causing renal insufficiency over her CKD.  Initial Plan:  Screening labs including CBC and Metabolic panel to evaluate for infectious or metabolic etiology of disease.  Urinalysis with reflex culture ordered to evaluate for UTI or relevant urologic/nephrologic pathology.  EKG to evaluate for cardiac pathology Objective evaluation as below reviewed   Initial Study Results:   Laboratory  Potassium is down trended to 5.7.   EKG EKG was reviewed independently. Rate, rhythm, axis, intervals all examined and without medically relevant abnormality. ST segments without concerns for elevations.    Reassessment and Plan:   She was called by her cardiologist today and told to hold her spironolactone  which will likely assist in further decreasing her potassium.  Additionally, we will start her on Lokelma twice daily, a low potassium diet, increased p.o. fluids as well as IV fluids while here in the emergency room plan for repeat lab check in 48 hours with her PCP.  EKG does not show any evidence of acute hyperkalemia or cardiac involvement of her symptoms.    Disposition:  I have considered need for hospitalization, however, considering all of the above, I believe this patient is stable for discharge at this time.  Patient/family educated about specific return precautions for given chief complaint and symptoms.  Patient/family educated about follow-up with PCP.     Patient/family expressed understanding of return precautions and need for follow-up. Patient spoken to regarding  all imaging and laboratory results and appropriate follow up for these results. All education provided in verbal form with additional information in written form. Time was allowed for answering of patient questions. Patient discharged.    Emergency Department Medication Summary:   Medications  sodium zirconium cyclosilicate (LOKELMA) packet 10 g (10 g Oral Given 03/12/24 2141)  ondansetron  (ZOFRAN ) injection 4 mg (has no administration in time range)  sodium chloride  0.9 % bolus 1,000 mL (1,000 mLs Intravenous New Bag/Given 03/12/24 2207)         Clinical Impression:  1. Hyperkalemia      Data Unavailable   Final Clinical Impression(s) / ED Diagnoses Final diagnoses:  Hyperkalemia    Rx / DC Orders ED Discharge Orders          Ordered    sodium zirconium cyclosilicate (LOKELMA) 10 g PACK packet  2 times daily        03/12/24 2309    ondansetron  (ZOFRAN ) 4 MG tablet  Every 6 hours        03/12/24 2309  Onetha Bile, MD 03/12/24 [phone]

## 2024-03-12 NOTE — Discharge Instructions (Addendum)
 You were seen today for high potassium. This can be a very dangerous condition and affects your heart. Your numbers are improving since her last sample to today.  However you need to see your primary care doctor and have repeat blood work done in 48 hours.  If you begin having palpitations or chest pain and then you need to come back to the emergency department for repeat lab checks more immediately.

## 2024-03-13 ENCOUNTER — Encounter (HOSPITAL_COMMUNITY): Admission: RE | Admit: 2024-03-13 | Payer: Medicare PPO | Source: Ambulatory Visit

## 2024-03-13 ENCOUNTER — Telehealth (HOSPITAL_COMMUNITY): Payer: Self-pay

## 2024-03-13 NOTE — Telephone Encounter (Signed)
 Received message. Pt stated she was in the ER majority of the night and will not be in today's session.

## 2024-03-14 ENCOUNTER — Other Ambulatory Visit (HOSPITAL_COMMUNITY): Payer: Self-pay

## 2024-03-14 MED ORDER — NEXLETOL 180 MG PO TABS: 1.0000 | ORAL_TABLET | Freq: Every day | ORAL | 3 refills | Status: AC

## 2024-03-14 NOTE — Progress Notes (Signed)
 Paramedicine Encounter    Patient ID: Jocelyn [name], female    DOB: [birth year], 68 y.o.   MRN: [id number]   Complaints-feels better   Edema-none   Compliance with meds-yes  Pill box filled-n/a  If so, by whom-pt fills it   Refills needed-n/a      Pt seen today for f/u from Mondays events of her lab from hematologist due to elevated K and low hemoglobin.  The ER wanted her to take lokelma for 7 days BID but her insurance denied this. I sent message to clinic to see if they will get her samples of this and see if she needs to come in for labs soon.  So she has not taken the lokelma since she left the ER.   Pt reports having a lot of diarrhea last night but feels better now.  She denies any bleeding issues.  No abd pain. No c/p. No dizziness.  She will still need to call kidney doc to get her appointment. She has been unable to reach them so I called while I was here and got thru. For some reason when she called, she kept getting a busy signal, it happened while I was here too.  Daughter reports the referral was sent back in August when she was d/c from hosp. Spoke with receptionist and since its been so long since her missed appoint (nov) they said she needs another referral. So sent message to triage ref that as well.   Appetite is meh- trying to increase fluids.   She has order for lipid panel and apolipoprotein B thru [facility]- if clinic wants her to come back for repeat labs soon then will see if they can add on these 2 also since she is a hard stick and to limit that if possible.   She is out of her nexletol , sent message to triage to see if this can be sent in as well.  She has been out of it for past few days.  B/P-110/64  HR-67 RR-15 [id number] Weight yesterday-149 Last visit weight-148  Patient Care Team: Jonathon Neighbors, MD as PCP - General (Family Medicine) Odie Benne, MD as PCP - Cardiology (Cardiology)  Patient Active Problem List   Diagnosis Date  Noted   Physical deconditioning 08/23/2023   Acute respiratory failure with hypoxia (HCC) 08/05/2023   Pulmonary embolism (HCC) 08/03/2023   Pulmonary edema 08/03/2023   Degenerative arthritis of knee 08/20/2021   Hx of pleural effusion    Dyspnea 08/16/2018   Fall on or from sidewalk curb, initial encounter 05/31/2018   History of acute inferior wall MI 08/22/2017   Diabetes mellitus (HCC)    S/P right coronary artery (RCA) stent placement    Overweight 05/05/2017   Diabetes mellitus type 2, controlled, without complications (HCC) 12/16/2016   Asthmatic bronchitis , chronic (HCC) 05/10/2016   Polymyositis (HCC) 09/15/2015   History of sarcoidosis 09/15/2015   Falling 03/13/2015   Edema 02/13/2014   Coronary artery disease involving native heart without angina pectoris 10/29/2013   Hyperlipidemia 10/29/2013   Iron (Fe) deficiency anemia 08/08/2013   Vitamin B 12 deficiency 08/08/2013   GERD (gastroesophageal reflux disease) 09/08/2011   Weakness 02/22/2011   Depression 02/22/2011   MENIERE'S DISEASE 04/30/2009   Vitamin D  deficiency 02/05/2009   Venous (peripheral) insufficiency 12/12/2007   RENAL CALCULUS 12/12/2007   DIZZINESS 12/12/2007   HYPERCHOLESTEROLEMIA 09/16/2007   Anxiety state 09/16/2007   HYPERTENSION, BENIGN 09/16/2007   Allergic rhinitis 09/16/2007  IRRITABLE BOWEL SYNDROME 09/16/2007   Myalgia and myositis 09/16/2007    Current Outpatient Medications:    allopurinol (ZYLOPRIM) 100 MG tablet, Take 1 tablet by mouth daily as needed., Disp: , Rfl:    ALPRAZolam  (XANAX ) 0.5 MG tablet, Take 0.5 mg by mouth 3 (three) times daily., Disp: , Rfl:    apixaban  (ELIQUIS ) 5 MG TABS tablet, Take 10 mg by mouth twice daily, then on 08/17/2023-switch to 5 mg by mouth twice daily., Disp: 180 tablet, Rfl: 3   azaTHIOprine  (IMURAN ) 50 MG tablet, Take 1 tablet by mouth daily., Disp: , Rfl:    Bempedoic Acid  (NEXLETOL ) 180 MG TABS, TAKE 1 TABLET BY MOUTH DAILY, Disp: 90 tablet,  Rfl: 3   empagliflozin  (JARDIANCE ) 25 MG TABS tablet, Take 1 tablet (25 mg total) by mouth daily before breakfast., Disp: 90 tablet, Rfl: 3   GEMTESA 75 MG TABS, Take 1 tablet by mouth daily., Disp: , Rfl:    icosapent  Ethyl (VASCEPA ) 1 g capsule, TAKE 2 CAPSULES(2 GRAMS) BY MOUTH TWICE DAILY, Disp: 360 capsule, Rfl: 3   metFORMIN  (GLUCOPHAGE -XR) 500 MG 24 hr tablet, Take 2 tablets (1,000 mg total) by mouth daily with breakfast., Disp: 180 tablet, Rfl: 3   metoprolol  succinate (TOPROL -XL) 100 MG 24 hr tablet, Take 1 tablet (100 mg total) by mouth daily. Take with or immediately following a meal., Disp: 90 tablet, Rfl: 3   nitroGLYCERIN  (NITROSTAT ) 0.4 MG SL tablet, Place 1 tablet (0.4 mg total) under the tongue every 5 (five) minutes as needed for chest pain., Disp: 25 tablet, Rfl: 2   ondansetron  (ZOFRAN ) 4 MG tablet, Take 1 tablet (4 mg total) by mouth every 6 (six) hours., Disp: 12 tablet, Rfl: 0   ONETOUCH VERIO test strip, CHECK BLOOD SUGAR TID AS DIRECTED, Disp: , Rfl:    pantoprazole  (PROTONIX ) 40 MG tablet, Take 1 tablet (40 mg total) by mouth daily., Disp: 30 tablet, Rfl: 0   PRALUENT  150 MG/ML SOAJ, ADMINISTER 1 ML UNDER THE SKIN EVERY 14 DAYS, Disp: 6 mL, Rfl: 3   sodium zirconium cyclosilicate (LOKELMA) 10 g PACK packet, Take 10 g by mouth 2 (two) times daily for 7 days., Disp: 14 packet, Rfl: 0   spironolactone  (ALDACTONE ) 25 MG tablet, TAKE 1 TABLET BY MOUTH EVERY DAY, Disp: 90 tablet, Rfl: 2 Allergies  Allergen Reactions   Actos [Pioglitazone] Other (See Comments)    REACTION: pt states INTOL w/ edema   Codeine  Other (See Comments)    REACTION: vomiting   Januvia [Sitagliptin] Nausea Only   Lactose Intolerance (Gi) Diarrhea   Morphine Nausea Only and Nausea And Vomiting    REACTION: vomiting REACTION: vomiting   Repatha  [Evolocumab ]     WAS NOT EFFECTIVE IN LOWERING LDL      Social History   Socioeconomic History   Marital status: Single    Spouse name: Not on file    Number of children: 1   Years of education: Not on file   Highest education level: Master's degree (e.g., MA, MS, MEng, MEd, MSW, MBA)  Occupational History   Occupation: Retired Runner, broadcasting/film/video   Tobacco Use   Smoking status: Never   Smokeless tobacco: Never   Tobacco comments:    Daily Caffeine - 1  Exercise 2-3 times/weekly  Vaping Use   Vaping status: Never Used  Substance and Sexual Activity   Alcohol use: No    Alcohol/week: 0.0 standard drinks of alcohol   Drug use: No   Sexual activity: Yes  Birth control/protection: None  Other Topics Concern   Not on file  Social History Narrative   Exercises 2-3 times weekly   Caffeine Use: 1 daily (tea or Pepsi)   Lives alone in a one story home.  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  Retired Midwife.     Social Drivers of Corporate investment banker Strain: Low Risk  (08/05/2023)   Overall Financial Resource Strain (CARDIA)    Difficulty of Paying Living Expenses: Not hard at all  Food Insecurity: No Food Insecurity (08/05/2023)   Hunger Vital Sign    Worried About Running Out of Food in the Last Year: Never true    Ran Out of Food in the Last Year: Never true  Transportation Needs: No Transportation Needs (08/05/2023)   PRAPARE - Administrator, Civil Service (Medical): No    Lack of Transportation (Non-Medical): No  Physical Activity: Not on file  Stress: No Stress Concern Present (08/05/2023)   [employer] of Occupational Health - Occupational Stress Questionnaire    Feeling of Stress : Not at all  Social Connections: Not on file  Intimate Partner Violence: Not At Risk (08/05/2023)   Humiliation, Afraid, Rape, and Kick questionnaire    Fear of Current or Ex-Partner: No    Emotionally Abused: No    Physically Abused: No    Sexually Abused: No    Physical Exam      Future Appointments  Date Time Provider Department Center  03/15/2024 10:15 AM [facility]AD [facility] None  03/20/2024 10:15 AM  [facility] None  03/22/2024 10:15 AM [facility] None  03/27/2024 10:15 AM [facility] None  03/29/2024 10:15 AM [facility] None  06/06/2024  2:00 PM [facility] PA/NP [facility] None  07/30/2024  1:45 PM Cassandra Cleveland, MD [facility] None       Alfonza Angry, Paramedic [phone] [facility] Paramedic  03/14/24

## 2024-03-15 ENCOUNTER — Encounter (HOSPITAL_COMMUNITY)
Admission: RE | Admit: 2024-03-15 | Discharge: 2024-03-15 | Disposition: A | Discharge status: Home / Self Care | Payer: Medicare PPO | Source: Ambulatory Visit | Attending: Internal Medicine | Admitting: Internal Medicine

## 2024-03-15 DIAGNOSIS — J9611 Chronic respiratory failure with hypoxia: Secondary | ICD-10-CM | POA: Diagnosis not present

## 2024-03-15 DIAGNOSIS — I2699 Other pulmonary embolism without acute cor pulmonale: Secondary | ICD-10-CM | POA: Insufficient documentation

## 2024-03-15 NOTE — Progress Notes (Signed)
 Daily Session Note  Patient Details  Name: Jocelyn [name] MRN: [id number] Date of Birth: 09-Nov-1956 Referring Provider:   Gattis Kass Pulmonary Rehab Walk Test from 01/02/2024 in [facility] for Heart, Vascular, & Lung Health  Referring Provider Ramaswamy       Encounter Date: 03/15/2024  Check In:  Session Check In - 03/15/24 1030       Check-In   Supervising physician immediately available to respond to emergencies [facility] MD immediately available    Physician(s) Koren Persons, NP    Location [facility]    Staff Present Cindra Cree, Emilio Harder, MS, ACSM-CEP, Exercise Physiologist;Randi Rochelle Chu, ACSM-CEP, Exercise Physiologist;Mary Arlester Ladd, RN, Mercedes Stake, RN, BSN    Virtual Visit No    Medication changes reported     No    Fall or balance concerns reported    No    Tobacco Cessation No Change    Warm-up and Cool-down Performed as group-led Writer Performed Yes    VAD Patient? No    PAD/SET Patient? No      Pain Assessment   Currently in Pain? No/denies    Pain Score 0-No pain    Multiple Pain Sites No             Capillary Blood Glucose: No results found for this or any previous visit (from the past 24 hours).    Social History   Tobacco Use  Smoking Status Never  Smokeless Tobacco Never  Tobacco Comments   Daily Caffeine - 1  Exercise 2-3 times/weekly    Goals Met:  Proper associated with RPD/PD & O2 Sat Independence with exercise equipment Exercise tolerated well No report of concerns or symptoms today Strength training completed today  Goals Unmet:  Not Applicable  Comments: Service time is from 1021 to 1141.    Dr. Genetta Kenning is Medical Director for Pulmonary Rehab at [facility].

## 2024-03-19 ENCOUNTER — Ambulatory Visit (HOSPITAL_COMMUNITY)
Admission: RE | Admit: 2024-03-19 | Discharge: 2024-03-19 | Disposition: A | Discharge status: Home / Self Care | Source: Ambulatory Visit | Attending: Internal Medicine | Admitting: Internal Medicine

## 2024-03-19 DIAGNOSIS — E876 Hypokalemia: Secondary | ICD-10-CM | POA: Insufficient documentation

## 2024-03-19 LAB — BASIC METABOLIC PANEL WITH GFR
Anion gap: 12 (ref 5–15)
BUN: 20 mg/dL (ref 8–23)
CO2: 19 mmol/L — ABNORMAL LOW (ref 22–32)
Calcium: 9.7 mg/dL (ref 8.9–10.3)
Chloride: 110 mmol/L (ref 98–111)
Creatinine, Ser: 1.31 mg/dL — ABNORMAL HIGH (ref 0.44–1.00)
GFR, Estimated: 44 mL/min — ABNORMAL LOW (ref >60)
Glucose, Bld: 119 mg/dL — ABNORMAL HIGH (ref 70–99)
Potassium: 4.2 mmol/L (ref 3.5–5.1)
Sodium: 141 mmol/L (ref 135–145)

## 2024-03-20 ENCOUNTER — Encounter (HOSPITAL_COMMUNITY)
Admission: RE | Admit: 2024-03-20 | Discharge: 2024-03-20 | Disposition: A | Discharge status: Home / Self Care | Payer: Medicare PPO | Source: Ambulatory Visit | Attending: Internal Medicine | Admitting: Internal Medicine

## 2024-03-20 DIAGNOSIS — L089 Local infection of the skin and subcutaneous tissue, unspecified: Secondary | ICD-10-CM | POA: Diagnosis not present

## 2024-03-20 DIAGNOSIS — Z6827 Body mass index (BMI) 27.0-27.9, adult: Secondary | ICD-10-CM | POA: Diagnosis not present

## 2024-03-20 DIAGNOSIS — S90561A Insect bite (nonvenomous), right ankle, initial encounter: Secondary | ICD-10-CM | POA: Diagnosis not present

## 2024-03-20 DIAGNOSIS — J9611 Chronic respiratory failure with hypoxia: Secondary | ICD-10-CM | POA: Diagnosis not present

## 2024-03-20 DIAGNOSIS — W57XXXA Bitten or stung by nonvenomous insect and other nonvenomous arthropods, initial encounter: Secondary | ICD-10-CM | POA: Diagnosis not present

## 2024-03-20 DIAGNOSIS — I2699 Other pulmonary embolism without acute cor pulmonale: Secondary | ICD-10-CM | POA: Diagnosis not present

## 2024-03-20 DIAGNOSIS — S70369A Insect bite (nonvenomous), unspecified thigh, initial encounter: Secondary | ICD-10-CM | POA: Diagnosis not present

## 2024-03-20 DIAGNOSIS — S90569A Insect bite (nonvenomous), unspecified ankle, initial encounter: Secondary | ICD-10-CM | POA: Diagnosis not present

## 2024-03-20 DIAGNOSIS — S70269A Insect bite (nonvenomous), unspecified hip, initial encounter: Secondary | ICD-10-CM | POA: Diagnosis not present

## 2024-03-20 DIAGNOSIS — S80869A Insect bite (nonvenomous), unspecified lower leg, initial encounter: Secondary | ICD-10-CM | POA: Diagnosis not present

## 2024-03-20 NOTE — Progress Notes (Signed)
 Daily Session Note  Patient Details  Name: Jocelyn [name] MRN: [id number] Date of Birth: 1956-02-13 Referring Provider:   Gattis Kass Pulmonary Rehab Walk Test from 01/02/2024 in [facility] for Heart, Vascular, & Lung Health  Referring Provider Ramaswamy       Encounter Date: 03/20/2024  Check In:  Session Check In - 03/20/24 0954       Check-In   Supervising physician immediately available to respond to emergencies [facility] MD immediately available    Physician(s) Slater Duncan, NP    Location [facility]    Staff Present Cindra Cree, Emilio Harder, MS, ACSM-CEP, Exercise Physiologist;Randi Rochelle Chu, ACSM-CEP, Exercise Physiologist;Johnny Alexia Angelucci, MS, Exercise Physiologist;Mary Arlester Ladd, RN, BSN    Virtual Visit No    Medication changes reported     No    Comments reviewed med list    Fall or balance concerns reported    No    Tobacco Cessation No Change    Warm-up and Cool-down Performed as group-led instruction    Resistance Training Performed Yes    VAD Patient? No    PAD/SET Patient? No      Pain Assessment   Currently in Pain? No/denies    Pain Score 0-No pain    Multiple Pain Sites No             Capillary Blood Glucose: Results for orders placed or performed during the hospital encounter of 03/19/24 (from the past 24 hours)  Basic Metabolic Panel (BMET)     Status: Abnormal   Collection Time: 03/19/24  2:19 PM  Result Value Ref Range   Sodium 141 135 - 145 mmol/L   Potassium 4.2 3.5 - 5.1 mmol/L   Chloride 110 98 - 111 mmol/L   CO2 19 (L) 22 - 32 mmol/L   Glucose, Bld 119 (H) 70 - 99 mg/dL   BUN 20 8 - 23 mg/dL   Creatinine, Ser 4.09 (H) 0.44 - 1.00 mg/dL   Calcium  9.7 8.9 - 10.3 mg/dL   GFR, Estimated 44 (L) >60 mL/min   Anion gap 12 5 - 15      Social History   Tobacco Use  Smoking Status Never  Smokeless Tobacco Never  Tobacco Comments   Daily Caffeine - 1  Exercise 2-3 times/weekly    Goals Met:   Proper associated with RPD/PD & O2 Sat Independence with exercise equipment Exercise tolerated well No report of concerns or symptoms today Strength training completed today  Goals Unmet:  Not Applicable  Comments: Service time is from 1025 to 1133.    Dr. Genetta Kenning is Medical Director for Pulmonary Rehab at [facility].

## 2024-03-21 ENCOUNTER — Other Ambulatory Visit (HOSPITAL_COMMUNITY): Payer: Self-pay

## 2024-03-22 ENCOUNTER — Encounter (HOSPITAL_COMMUNITY)
Admission: RE | Admit: 2024-03-22 | Discharge: 2024-03-22 | Disposition: A | Discharge status: Home / Self Care | Payer: Medicare PPO | Source: Ambulatory Visit | Attending: Internal Medicine | Admitting: Internal Medicine

## 2024-03-22 DIAGNOSIS — I2699 Other pulmonary embolism without acute cor pulmonale: Secondary | ICD-10-CM

## 2024-03-22 DIAGNOSIS — J9611 Chronic respiratory failure with hypoxia: Secondary | ICD-10-CM | POA: Diagnosis not present

## 2024-03-22 NOTE — Progress Notes (Signed)
 Daily Session Note  Patient Details  Name: Jocelyn [name] MRN: [id number] Date of Birth: 1956-09-27 Referring Provider:   Gattis Kass Pulmonary Rehab Walk Test from 01/02/2024 in [facility] for Heart, Vascular, & Lung Health  Referring Provider Ramaswamy       Encounter Date: 03/22/2024  Check In:  Session Check In - 03/22/24 1113       Check-In   Supervising physician immediately available to respond to emergencies [facility] MD immediately available    Physician(s) Levin Reamer, NP    Location [facility]    Staff Present Cindra Cree, Emilio Harder, MS, ACSM-CEP, Exercise Physiologist;Randi Rochelle Chu, ACSM-CEP, Exercise Physiologist;Mary Arlester Ladd, RN, Malvin Searing, MS, ACSM-CEP, CCRP, Exercise Physiologist    Virtual Visit No    Medication changes reported     No    Comments reviewed med list    Fall or balance concerns reported    No    Tobacco Cessation No Change    Warm-up and Cool-down Performed as group-led instruction    Resistance Training Performed Yes    VAD Patient? No    PAD/SET Patient? No      Pain Assessment   Currently in Pain? No/denies    Multiple Pain Sites No             Capillary Blood Glucose: No results found for this or any previous visit (from the past 24 hours).    Social History   Tobacco Use  Smoking Status Never  Smokeless Tobacco Never  Tobacco Comments   Daily Caffeine - 1  Exercise 2-3 times/weekly    Goals Met:  Proper associated with RPD/PD & O2 Sat Independence with exercise equipment Exercise tolerated well No report of concerns or symptoms today Strength training completed today  Goals Unmet:  Not Applicable  Comments: Service time is from 1020 to 1135.    Dr. Genetta Kenning is Medical Director for Pulmonary Rehab at [facility].

## 2024-03-27 ENCOUNTER — Encounter (HOSPITAL_COMMUNITY)
Admission: RE | Admit: 2024-03-27 | Discharge: 2024-03-27 | Disposition: A | Discharge status: Home / Self Care | Payer: Medicare PPO | Source: Ambulatory Visit | Attending: Internal Medicine

## 2024-03-27 VITALS — Wt 151.2 lb

## 2024-03-27 DIAGNOSIS — I2699 Other pulmonary embolism without acute cor pulmonale: Secondary | ICD-10-CM | POA: Diagnosis not present

## 2024-03-27 DIAGNOSIS — J9611 Chronic respiratory failure with hypoxia: Secondary | ICD-10-CM | POA: Diagnosis not present

## 2024-03-27 NOTE — Progress Notes (Signed)
 Home Exercise Prescription I have reviewed a Home Exercise Prescription with Jocelyn [name]. Jocelyn [name] is not currently exercising at home. I encouraged her to try walking around her home or walking around the grocery store 2 days/wk for 30 min/day. Jocelyn [name] agreed with my recommendations. I also encouraged Jocelyn [name] to ask about [employer] membership through her insurance complany. She voiced understanding. I am unsure how motivated Jocelyn [name] is to exercise on her own. The patient stated that their goals were to maintain current health. We reviewed exercise guidelines, target heart rate during exercise, RPE Scale, weather conditions, endpoints for exercise, warmup and cool down. The patient is encouraged to come to me with any questions. I will continue to follow up with the patient to assist them with progression and safety. Spent 15 min with patient discussing home exercise plan and goals  Jocelyn Huron, MS, ACSM-CEP 03/27/2024 3:24 PM

## 2024-03-27 NOTE — Progress Notes (Signed)
 Daily Session Note  Patient Details  Name: Jocelyn [name] MRN: [id number] Date of Birth: 1956/06/11 Referring Provider:   Gattis Kass Pulmonary Rehab Walk Test from 01/02/2024 in [facility] for Heart, Vascular, & Lung Health  Referring Provider Ramaswamy       Encounter Date: 03/27/2024  Check In:  Session Check In - 03/27/24 1122       Check-In   Supervising physician immediately available to respond to emergencies [facility] MD immediately available    Physician(s) Levin Reamer, NP    Location [facility]    Staff Present Cindra Cree, Emilio Harder, MS, ACSM-CEP, Exercise Physiologist;Randi Rochelle Chu, ACSM-CEP, Exercise Physiologist;Mary Arlester Ladd, RN, BSN;Samantha Belarus, RD, Evalyn Hillier, RN, BSN    Virtual Visit No    Medication changes reported     No    Fall or balance concerns reported    No    Tobacco Cessation No Change    Warm-up and Cool-down Performed as group-led Writer Performed Yes    VAD Patient? No    PAD/SET Patient? No      Pain Assessment   Currently in Pain? No/denies             Capillary Blood Glucose: No results found for this or any previous visit (from the past 24 hours).   Exercise Prescription Changes - 03/27/24 1100       Response to Exercise   Blood Pressure (Admit) 102/62    Blood Pressure (Exercise) 90/50    Blood Pressure (Exit) 100/58    Heart Rate (Admit) 79 bpm    Heart Rate (Exercise) 82 bpm    Heart Rate (Exit) 69 bpm    Oxygen  Saturation (Admit) 97 %    Oxygen  Saturation (Exercise) 97 %    Oxygen  Saturation (Exit) 97 %    Rating of Perceived Exertion (Exercise) 12    Perceived Dyspnea (Exercise) 1    Duration Continue with 30 min of aerobic exercise without signs/symptoms of physical distress.    Intensity THRR unchanged      Progression   Progression Continue to progress workloads to maintain intensity without signs/symptoms of physical  distress.      Resistance Training   Training Prescription Yes    Weight red bands    Reps 10-15    Time 10 Minutes      Recumbant Bike   Level 3    RPM 40    Minutes 15    METs 2.4      NuStep   Level 3    SPM 87    Minutes 15    METs 1.9             Social History   Tobacco Use  Smoking Status Never  Smokeless Tobacco Never  Tobacco Comments   Daily Caffeine - 1  Exercise 2-3 times/weekly    Goals Met:  Proper associated with RPD/PD & O2 Sat Exercise tolerated well No report of concerns or symptoms today Strength training completed today  Goals Unmet:  Not Applicable  Comments: Service time is from 1020 to 1126.    Dr. Genetta Kenning is Medical Director for Pulmonary Rehab at [facility].

## 2024-03-28 NOTE — Progress Notes (Signed)
 Pulmonary Individual Treatment Plan  Patient Details  Name: Jocelyn [name] MRN: [id number] Date of Birth: August 28, 1956 Referring Provider:   Gattis Kass Pulmonary Rehab Walk Test from 01/02/2024 in [facility]  Referring Provider Ramaswamy       Initial Encounter Date:  Flowsheet Row Pulmonary Rehab Walk Test from 01/02/2024 in [facility]  Date 01/02/24       Visit Diagnosis: Chronic respiratory failure with hypoxia (HCC)  Pulmonary embolism, unspecified chronicity, unspecified pulmonary embolism type, unspecified whether acute cor pulmonale present (HCC)  Patient's Home Medications on Admission:   Current Outpatient Medications:    allopurinol (ZYLOPRIM) 100 MG tablet, Take 1 tablet by mouth daily as needed., Disp: , Rfl:    ALPRAZolam  (XANAX ) 0.5 MG tablet, Take 0.5 mg by mouth 3 (three) times daily., Disp: , Rfl:    apixaban  (ELIQUIS ) 5 MG TABS tablet, Take 10 mg by mouth twice daily, then on 08/17/2023-switch to 5 mg by mouth twice daily., Disp: 180 tablet, Rfl: 3   azaTHIOprine  (IMURAN ) 50 MG tablet, Take 1 tablet by mouth daily., Disp: , Rfl:    Bempedoic Acid  (NEXLETOL ) 180 MG TABS, Take 1 tablet (180 mg total) by mouth daily., Disp: 90 tablet, Rfl: 3   empagliflozin  (JARDIANCE ) 25 MG TABS tablet, Take 1 tablet (25 mg total) by mouth daily before breakfast., Disp: 90 tablet, Rfl: 3   GEMTESA 75 MG TABS, Take 1 tablet by mouth daily., Disp: , Rfl:    icosapent  Ethyl (VASCEPA ) 1 g capsule, TAKE 2 CAPSULES(2 GRAMS) BY MOUTH TWICE DAILY, Disp: 360 capsule, Rfl: 3   metFORMIN  (GLUCOPHAGE -XR) 500 MG 24 hr tablet, Take 2 tablets (1,000 mg total) by mouth daily with breakfast., Disp: 180 tablet, Rfl: 3   metoprolol  succinate (TOPROL -XL) 100 MG 24 hr tablet, Take 1 tablet (100 mg total) by mouth daily. Take with or immediately following a meal., Disp: 90 tablet, Rfl:  3   nitroGLYCERIN  (NITROSTAT ) 0.4 MG SL tablet, Place 1 tablet (0.4 mg total) under the tongue every 5 (five) minutes as needed for chest pain., Disp: 25 tablet, Rfl: 2   ondansetron  (ZOFRAN ) 4 MG tablet, Take 1 tablet (4 mg total) by mouth every 6 (six) hours., Disp: 12 tablet, Rfl: 0   ONETOUCH VERIO test strip, CHECK BLOOD SUGAR TID AS DIRECTED, Disp: , Rfl:    pantoprazole  (PROTONIX ) 40 MG tablet, Take 1 tablet (40 mg total) by mouth daily., Disp: 30 tablet, Rfl: 0   PRALUENT  150 MG/ML SOAJ, ADMINISTER 1 ML UNDER THE SKIN EVERY 14 DAYS, Disp: 6 mL, Rfl: 3   spironolactone  (ALDACTONE ) 25 MG tablet, TAKE 1 TABLET BY MOUTH EVERY DAY (Patient not taking: Reported on 03/14/2024), Disp: 90 tablet, Rfl: 2  Past Medical History: Past Medical History:  Diagnosis Date   Acute bronchitis    Allergic rhinitis, cause unspecified    Anemia    Anxiety    B12 deficiency    BV (bacterial vaginosis) 06/22/1996   Calculus of kidney    Calculus of kidney    Coronary atherosclerosis of unspecified type of vessel, native or graft    Dizziness    Essential hypertension, benign    Fibroid 2003   Fibromyalgia    H/O dysmenorrhea 2008   H/O varicella    Headache(784.0)    frequently   HSV-2 infection 2009   Hyperplastic colon polyp 05/16/2014   Irritable bowel  syndrome    Meniere's disease, unspecified    Menses, irregular 2003   Myalgia and myositis, unspecified    Myocardial infarction (HCC)    Obstructive sleep apnea (adult) (pediatric)    Perimenopausal symptoms 2003   Pure hypercholesterolemia    Sarcoidosis    Type II or unspecified type diabetes mellitus without mention of complication, not stated as uncontrolled    Unspecified venous (peripheral) insufficiency    Vitamin D  deficiency    Vulvitis 2010   Yeast infection     Tobacco Use: Social History   Tobacco Use  Smoking Status Never  Smokeless Tobacco Never  Tobacco Comments   Daily Caffeine - 1  Exercise 2-3 times/weekly     Labs: Review Flowsheet  More data exists      Latest Ref Rng & Units 08/04/2023 08/05/2023 08/06/2023 08/07/2023 12/08/2023  Labs for ITP Cardiac and Pulmonary Rehab  Cholestrol 0 - 200 mg/dL - - - - 914   LDL (calc) 0 - 99 mg/dL - - - - 782   HDL-C >95 mg/dL - - - - 27   Trlycerides <150 mg/dL - - - - 621   PH, Arterial 7.35 - 7.45 7.494  7.184  7.105  6.837  7.445  - - -  PCO2 arterial 32 - 48 mmHg 27.7  20.6  28.2  38.6  36.1  - - -  Bicarbonate 20.0 - 28.0 mmol/L 21.2  8.1  8.9  6.5  24.7  - - -  TCO2 22 - 32 mmol/L 22  9  10  8  26   - - -  Acid-base deficit 0.0 - 2.0 mmol/L 1.0  19.0  19.0  26.0  - - - -  O2 Saturation % 66.7  57.8  100  99  99  96  99  69.2  70.6  79.7  -    Details       Multiple values from one day are sorted in reverse-chronological order         Capillary Blood Glucose: Lab Results  Component Value Date   GLUCAP 104 (H) 01/12/2024   GLUCAP 110 (H) 01/12/2024   GLUCAP 90 01/10/2024   GLUCAP 98 01/10/2024   GLUCAP 115 (H) 08/11/2023     Pulmonary Assessment Scores:  Pulmonary Assessment Scores     Row Name 01/02/24 1423         ADL UCSD   ADL Phase Entry     SOB Score total 39       CAT Score   CAT Score 16       mMRC Score   mMRC Score 3             UCSD: Self-administered rating of dyspnea associated with activities of daily living (ADLs) 6-point scale (0 = "not at all" to 5 = "maximal or unable to do because of breathlessness")  Scoring Scores range from 0 to 120.  Minimally important difference is 5 units  CAT: CAT can identify the health impairment of COPD patients and is better correlated with disease progression.  CAT has a scoring range of zero to 40. The CAT score is classified into four groups of low (less than 10), medium (10 - 20), high (21-30) and very high (31-40) based on the impact level of disease on health status. A CAT score over 10 suggests significant symptoms.  A worsening CAT score could be explained by  an exacerbation, poor medication adherence, poor inhaler technique, or progression of  COPD or comorbid conditions.  CAT MCID is 2 points  mMRC: mMRC (Modified Medical Research Council) Dyspnea Scale is used to assess the degree of baseline functional disability in patients of respiratory disease due to dyspnea. No minimal important difference is established. A decrease in score of 1 point or greater is considered a positive change.   Pulmonary Function Assessment:  Pulmonary Function Assessment - 01/02/24 1338       Breath   Bilateral Breath Sounds Clear    Shortness of Breath Yes;Fear of Shortness of Breath;Limiting activity             Exercise Target Goals: Exercise Program Goal: Individual exercise prescription set using results from initial 6 min walk test and THRR while considering  patient's activity barriers and safety.   Exercise Prescription Goal: Initial exercise prescription builds to 30-45 minutes a day of aerobic activity, 2-3 days per week.  Home exercise guidelines will be given to patient during program as part of exercise prescription that the participant will acknowledge.  Activity Barriers & Risk Stratification:  Activity Barriers & Cardiac Risk Stratification - 01/02/24 1339       Activity Barriers & Cardiac Risk Stratification   Activity Barriers Joint Problems;History of Falls;Deconditioning;Muscular Weakness;Shortness of Breath             6 Minute Walk:  6 Minute Walk     Row Name 01/02/24 1507         6 Minute Walk   Phase Initial     Distance 1060 feet     Walk Time 6 minutes     # of Rest Breaks 2  brief rest 1:57-2:10, 4:42-4:50     MPH 2.01     METS 2.39     RPE 12     Perceived Dyspnea  1     VO2 Peak 8.35     Symptoms Yes (comment)     Comments fatigue     Resting HR 60 bpm     Resting BP 122/64     Resting Oxygen  Saturation  100 %     Exercise Oxygen  Saturation  during 6 min walk 100 %     Max Ex. HR 89 bpm     Max Ex.  BP 136/60     2 Minute Post BP 150/70       Interval HR   1 Minute HR 83     2 Minute HR 89     3 Minute HR 84     4 Minute HR 89     5 Minute HR 84     6 Minute HR 86     2 Minute Post HR 59     Interval Heart Rate? Yes       Interval Oxygen    Interval Oxygen ? Yes     Baseline Oxygen  Saturation % 100 %     1 Minute Oxygen  Saturation % 100 %     1 Minute Liters of Oxygen  0 L     2 Minute Oxygen  Saturation % 100 %     2 Minute Liters of Oxygen  0 L     3 Minute Oxygen  Saturation % 100 %     3 Minute Liters of Oxygen  0 L     4 Minute Oxygen  Saturation % 100 %     4 Minute Liters of Oxygen  0 L     5 Minute Oxygen  Saturation % 100 %     5 Minute Liters of Oxygen   0 L     6 Minute Oxygen  Saturation % 100 %     6 Minute Liters of Oxygen  0 L     2 Minute Post Oxygen  Saturation % 100 %     2 Minute Post Liters of Oxygen  0 L              Oxygen  Initial Assessment:  Oxygen  Initial Assessment - 01/02/24 1337       Home Oxygen    Home Oxygen  Device None    Sleep Oxygen  Prescription None    Home Exercise Oxygen  Prescription None    Home Resting Oxygen  Prescription None      Initial 6 min Walk   Oxygen  Used None      Program Oxygen  Prescription   Program Oxygen  Prescription None      Intervention   Short Term Goals To learn and understand importance of maintaining oxygen  saturations>88%;To learn and understand importance of monitoring SPO2 with pulse oximeter and demonstrate accurate use of the pulse oximeter.;To learn and demonstrate proper pursed lip breathing techniques or other breathing techniques.     Long  Term Goals Exhibits proper breathing techniques, such as pursed lip breathing or other method taught during program session;Verbalizes importance of monitoring SPO2 with pulse oximeter and return demonstration;Maintenance of O2 saturations>88%             Oxygen  Re-Evaluation:  Oxygen  Re-Evaluation     Row Name 01/30/24 1610 02/22/24 1055 03/23/24 0946          Program Oxygen  Prescription   Program Oxygen  Prescription None None None       Home Oxygen    Home Oxygen  Device None None None     Sleep Oxygen  Prescription None None None     Home Exercise Oxygen  Prescription None None None     Home Resting Oxygen  Prescription None None None       Goals/Expected Outcomes   Short Term Goals To learn and understand importance of maintaining oxygen  saturations>88%;To learn and understand importance of monitoring SPO2 with pulse oximeter and demonstrate accurate use of the pulse oximeter.;To learn and demonstrate proper pursed lip breathing techniques or other breathing techniques.  To learn and understand importance of maintaining oxygen  saturations>88%;To learn and understand importance of monitoring SPO2 with pulse oximeter and demonstrate accurate use of the pulse oximeter.;To learn and demonstrate proper pursed lip breathing techniques or other breathing techniques.  To learn and understand importance of maintaining oxygen  saturations>88%;To learn and understand importance of monitoring SPO2 with pulse oximeter and demonstrate accurate use of the pulse oximeter.;To learn and demonstrate proper pursed lip breathing techniques or other breathing techniques.      Long  Term Goals Exhibits proper breathing techniques, such as pursed lip breathing or other method taught during program session;Verbalizes importance of monitoring SPO2 with pulse oximeter and return demonstration;Maintenance of O2 saturations>88% Exhibits proper breathing techniques, such as pursed lip breathing or other method taught during program session;Verbalizes importance of monitoring SPO2 with pulse oximeter and return demonstration;Maintenance of O2 saturations>88% Exhibits proper breathing techniques, such as pursed lip breathing or other method taught during program session;Verbalizes importance of monitoring SPO2 with pulse oximeter and return demonstration;Maintenance of O2 saturations>88%      Goals/Expected Outcomes Compliance and understanding of oxygen  saturations monitoring and breathing techniques to decrease shortness of breath. Compliance and understanding of oxygen  saturations monitoring and breathing techniques to decrease shortness of breath. Compliance and understanding of oxygen  saturations monitoring and breathing techniques to decrease shortness of breath.  Oxygen  Discharge (Final Oxygen  Re-Evaluation):  Oxygen  Re-Evaluation - 03/23/24 0946       Program Oxygen  Prescription   Program Oxygen  Prescription None      Home Oxygen    Home Oxygen  Device None    Sleep Oxygen  Prescription None    Home Exercise Oxygen  Prescription None    Home Resting Oxygen  Prescription None      Goals/Expected Outcomes   Short Term Goals To learn and understand importance of maintaining oxygen  saturations>88%;To learn and understand importance of monitoring SPO2 with pulse oximeter and demonstrate accurate use of the pulse oximeter.;To learn and demonstrate proper pursed lip breathing techniques or other breathing techniques.     Long  Term Goals Exhibits proper breathing techniques, such as pursed lip breathing or other method taught during program session;Verbalizes importance of monitoring SPO2 with pulse oximeter and return demonstration;Maintenance of O2 saturations>88%    Goals/Expected Outcomes Compliance and understanding of oxygen  saturations monitoring and breathing techniques to decrease shortness of breath.             Initial Exercise Prescription:  Initial Exercise Prescription - 01/02/24 1500       Date of Initial Exercise RX and Referring Provider   Date 01/02/24    Referring Provider Ramaswamy    Expected Discharge Date 03/29/24      NuStep   Level 1    SPM 60    Minutes 15    METs 2      Recumbant Elliptical   Level 1    RPM 60    Minutes 15    METs 2      Prescription Details   Frequency (times per week) 2    Duration Progress to  30 minutes of continuous aerobic without signs/symptoms of physical distress      Intensity   THRR 40-80% of Max Heartrate 60-121    Ratings of Perceived Exertion 11-13    Perceived Dyspnea 0-4      Progression   Progression Continue to progress workloads to maintain intensity without signs/symptoms of physical distress.      Resistance Training   Training Prescription Yes    Weight yellow bands    Reps 10-15             Perform Capillary Blood Glucose checks as needed.  Exercise Prescription Changes:   Exercise Prescription Changes     Row Name 01/17/24 1100 01/31/24 1100 02/14/24 1100 02/28/24 1200 03/08/24 1025     Response to Exercise   Blood Pressure (Admit) 110/74 112/58 110/64 108/60 104/60   Blood Pressure (Exercise) 130/70 102/60 112/60 132/72 --   Blood Pressure (Exit) 108/60 106/58 100/60 105/62 98/58   Heart Rate (Admit) 84 bpm 84 bpm 80 bpm 78 bpm 70 bpm   Heart Rate (Exercise) 84 bpm 89 bpm 89 bpm 94 bpm 84 bpm   Heart Rate (Exit) 76 bpm 74 bpm 82 bpm 83 bpm 66 bpm   Oxygen  Saturation (Admit) 96 % 99 % 100 % 98 % 100 %   Oxygen  Saturation (Exercise) 96 % 98 % 98 % 98 % 97 %   Oxygen  Saturation (Exit) 97 % 97 % 99 % 98 % 99 %   Rating of Perceived Exertion (Exercise) 14 14 12 13 14    Perceived Dyspnea (Exercise) 2 2 1 1 1    Duration Continue with 30 min of aerobic exercise without signs/symptoms of physical distress. Continue with 30 min of aerobic exercise without signs/symptoms of physical distress. Continue with 30  min of aerobic exercise without signs/symptoms of physical distress. Continue with 30 min of aerobic exercise without signs/symptoms of physical distress. Continue with 30 min of aerobic exercise without signs/symptoms of physical distress.   Intensity THRR unchanged THRR unchanged THRR unchanged THRR unchanged THRR unchanged     Progression   Progression Continue to progress workloads to maintain intensity without signs/symptoms of physical  distress. Continue to progress workloads to maintain intensity without signs/symptoms of physical distress. Continue to progress workloads to maintain intensity without signs/symptoms of physical distress. Continue to progress workloads to maintain intensity without signs/symptoms of physical distress. Continue to progress workloads to maintain intensity without signs/symptoms of physical distress.     Resistance Training   Training Prescription Yes Yes Yes Yes Yes   Weight yellow bands yellow bands red bands red bands red bands   Reps 10-15 10-15 10-15 10-15 10-15   Time 10 Minutes 10 Minutes 10 Minutes 10 Minutes 10 Minutes     Recumbant Bike   Level 2 2 3 3 3    RPM -- 42 26 37 --   Watts -- 11 12 17  --   Minutes 15 15 15 15 15    METs 1.7 1.8 1.9 2.2 2.1     NuStep   Level 2 3 3 3 3    SPM 67 69 91 110 98   Minutes 15 15 15 15 15    METs 1.6 1.8 1.9 1.9 2.4    Row Name 03/27/24 1100             Response to Exercise   Blood Pressure (Admit) 102/62       Blood Pressure (Exercise) 90/50       Blood Pressure (Exit) 100/58       Heart Rate (Admit) 79 bpm       Heart Rate (Exercise) 82 bpm       Heart Rate (Exit) 69 bpm       Oxygen  Saturation (Admit) 97 %       Oxygen  Saturation (Exercise) 97 %       Oxygen  Saturation (Exit) 97 %       Rating of Perceived Exertion (Exercise) 12       Perceived Dyspnea (Exercise) 1       Duration Continue with 30 min of aerobic exercise without signs/symptoms of physical distress.       Intensity THRR unchanged         Progression   Progression Continue to progress workloads to maintain intensity without signs/symptoms of physical distress.         Resistance Training   Training Prescription Yes       Weight red bands       Reps 10-15       Time 10 Minutes         Recumbant Bike   Level 3       RPM 40       Minutes 15       METs 2.4         NuStep   Level 3       SPM 87       Minutes 15       METs 1.9                 Exercise Comments:   Exercise Comments     Row Name 01/10/24 1515 03/27/24 1521         Exercise Comments Carmine completed first day of exercise. She  exercised for 15 min on the recumbent bike and Nustep. Jonet averaged 1.2 METs at level 1 on the recumbent bike and 1.3 METs at level 1 on the Nustep. She performed the warmup and cooldown standing with a few verbal cues. Discussed METs. Completed home exercise plan. Blaze is not currently exercising at home. I encouraged her to try walking around her home or walking around the grocery store 2 days/wk for 30 min/day. Palmer agreed with my recommendations. I also encouraged Tishanna to ask about [employer] membership through her insurance complany. She voiced understanding. I am unsure how motivated Amarachukwu is to exercise on her own.               Exercise Goals and Review:   Exercise Goals     Row Name 01/02/24 1339             Exercise Goals   Increase Physical Activity Yes       Intervention Provide advice, education, support and counseling about physical activity/exercise needs.;Develop an individualized exercise prescription for aerobic and resistive training based on initial evaluation findings, risk stratification, comorbidities and participant's personal goals.       Expected Outcomes Short Term: Attend rehab on a regular basis to increase amount of physical activity.;Long Term: Exercising regularly at least 3-5 days a week.;Long Term: Add in home exercise to make exercise part of routine and to increase amount of physical activity.       Increase Strength and Stamina Yes       Intervention Provide advice, education, support and counseling about physical activity/exercise needs.;Develop an individualized exercise prescription for aerobic and resistive training based on initial evaluation findings, risk stratification, comorbidities and participant's personal goals.       Expected Outcomes Short Term: Increase workloads from initial  exercise prescription for resistance, speed, and METs.;Short Term: Perform resistance training exercises routinely during rehab and add in resistance training at home;Long Term: Improve cardiorespiratory fitness, muscular endurance and strength as measured by increased METs and functional capacity ( )       Able to understand and use rate of perceived exertion (RPE) scale Yes       Intervention Provide education and explanation on how to use RPE scale       Expected Outcomes Short Term: Able to use RPE daily in rehab to express subjective intensity level;Long Term:  Able to use RPE to guide intensity level when exercising independently       Able to understand and use Dyspnea scale Yes       Intervention Provide education and explanation on how to use Dyspnea scale       Expected Outcomes Short Term: Able to use Dyspnea scale daily in rehab to express subjective sense of shortness of breath during exertion;Long Term: Able to use Dyspnea scale to guide intensity level when exercising independently       Knowledge and understanding of Target Heart Rate Range (THRR) Yes       Intervention Provide education and explanation of THRR including how the numbers were predicted and where they are located for reference       Expected Outcomes Short Term: Able to state/look up THRR;Short Term: Able to use daily as guideline for intensity in rehab;Long Term: Able to use THRR to govern intensity when exercising independently       Understanding of Exercise Prescription Yes       Intervention Provide education, explanation, and written materials on patient's individual exercise prescription  Expected Outcomes Short Term: Able to explain program exercise prescription;Long Term: Able to explain home exercise prescription to exercise independently                Exercise Goals Re-Evaluation :  Exercise Goals Re-Evaluation     Row Name 01/03/24 0746 01/30/24 0906 02/22/24 1051 03/23/24 0944        Exercise Goal Re-Evaluation   Exercise Goals Review Increase Physical Activity;Able to understand and use Dyspnea scale;Understanding of Exercise Prescription;Increase Strength and Stamina;Knowledge and understanding of Target Heart Rate Range (THRR);Able to understand and use rate of perceived exertion (RPE) scale Increase Physical Activity;Able to understand and use Dyspnea scale;Understanding of Exercise Prescription;Increase Strength and Stamina;Knowledge and understanding of Target Heart Rate Range (THRR);Able to understand and use rate of perceived exertion (RPE) scale Increase Physical Activity;Able to understand and use Dyspnea scale;Understanding of Exercise Prescription;Increase Strength and Stamina;Knowledge and understanding of Target Heart Rate Range (THRR);Able to understand and use rate of perceived exertion (RPE) scale Increase Physical Activity;Able to understand and use Dyspnea scale;Understanding of Exercise Prescription;Increase Strength and Stamina;Knowledge and understanding of Target Heart Rate Range (THRR);Able to understand and use rate of perceived exertion (RPE) scale    Comments Pt to begin exercise 01/10/24. Will monitor for progressions. Jaiyana has completed 6 exercise sessions. She exercises for 15 min on the recumbent bike and Nustep. Aizlyn averages 2.0 METs at level 2 on the recumbent bike and 1.7 METs at level 2 on the Nustep. Keyshla has increased her level for both exericse modes as METs have slightly increased. Will continue to monitor and progress as able. Shekira has completed 12 exercise sessions. She exercises for 15 min on the recumbent bike and Nustep. Norah averages 2.3 METs at level 3 on the recumbent bike and 1.9 METs at level 3 on the Nustep. Merlinda has increased her level for both exericse modes. She tolerates progressions well thus far. I have not discussed home exercise with her yet, but I plan to soon. Will continue to monitor and progress as able. Twyla has  completed 20 exercise sessions. She exercises for 15 min on the recumbent bike and Nustep. Leandria averages 2.4 METs at level 4 on the recumbent bike and 2.3 METs at level 3 on the Nustep. She performs the warmup and cooldown standing without limitations. Maerene has increased her level on the recumbent bike but not Nustep. Kailah has been hesistant to increase her level despite encouragement from staff. Will continue to monitor and progress as able.    Expected Outcomes Through exercise at rehab and home, the patient will decrease shortness of breath with daily activities and feel confident in carrying out an exercise regimen at home Through exercise at rehab and home, the patient will decrease shortness of breath with daily activities and feel confident in carrying out an exercise regimen at home Through exercise at rehab and home, the patient will decrease shortness of breath with daily activities and feel confident in carrying out an exercise regimen at home Through exercise at rehab and home, the patient will decrease shortness of breath with daily activities and feel confident in carrying out an exercise regimen at home             Discharge Exercise Prescription (Final Exercise Prescription Changes):  Exercise Prescription Changes - 03/27/24 1100       Response to Exercise   Blood Pressure (Admit) 102/62    Blood Pressure (Exercise) 90/50    Blood Pressure (Exit) 100/58  Heart Rate (Admit) 79 bpm    Heart Rate (Exercise) 82 bpm    Heart Rate (Exit) 69 bpm    Oxygen  Saturation (Admit) 97 %    Oxygen  Saturation (Exercise) 97 %    Oxygen  Saturation (Exit) 97 %    Rating of Perceived Exertion (Exercise) 12    Perceived Dyspnea (Exercise) 1    Duration Continue with 30 min of aerobic exercise without signs/symptoms of physical distress.    Intensity THRR unchanged      Progression   Progression Continue to progress workloads to maintain intensity without signs/symptoms of physical  distress.      Resistance Training   Training Prescription Yes    Weight red bands    Reps 10-15    Time 10 Minutes      Recumbant Bike   Level 3    RPM 40    Minutes 15    METs 2.4      NuStep   Level 3    SPM 87    Minutes 15    METs 1.9             Nutrition:  Target Goals: Understanding of nutrition guidelines, daily intake of sodium 1500mg , cholesterol 200mg , calories 30% from fat and 7% or less from saturated fats, daily to have 5 or more servings of fruits and vegetables.  Biometrics:  Pre Biometrics - 01/02/24 1507       Pre Biometrics   Grip Strength 11 kg              Nutrition Therapy Plan and Nutrition Goals:  Nutrition Therapy & Goals - 03/27/24 1143       Nutrition Therapy   Diet Heart Healthy diet      Personal Nutrition Goals   Nutrition Goal Patient to improve diet quality by using the plate method as a guide for meal planning to include lean protein/plant protein, fruits, vegetables, whole grains, nonfat dairy as part of a well-balanced diet.   goal in progress.   Comments Goals in progress. Kamri has medical history of sarcoidosis, CAD, DM2, pulmonary embolism, hyperlipidemia. She continues regular follow-up with cardiology; lipid panel is not at goal. She was awarded financial assistance for Vascepa , Praluent , Nexletol  in January 2025. A1c remains well controlled. She is up 2.2# since starting with our program. Answered patient questions and gave resources for easy, grab and go breakfasts.  Patient was hospitalized on 03/12/24 for hyperkalemia and spironolactone  was stopped; at that time gave education on low potassium intake. Potassium has returned to normal (5/5 labs K+ 4.2); recommended liberalization of potassium intake at that time. Patient will benefit from participation in pulmonary rehab for nutrition, exercise, and lifestyle modification.      Intervention Plan   Intervention Prescribe, educate and counsel regarding individualized  specific dietary modifications aiming towards targeted core components such as weight, hypertension, lipid management, diabetes, heart failure and other comorbidities.;Nutrition handout(s) given to patient.    Expected Outcomes Short Term Goal: Understand basic principles of dietary content, such as calories, fat, sodium, cholesterol and nutrients.;Long Term Goal: Adherence to prescribed nutrition plan.             Nutrition Assessments:  MEDIFICTS Score Key: >=70 Need to make dietary changes  40-70 Heart Healthy Diet <= 40 Therapeutic Level Cholesterol Diet   Picture Your Plate Scores: <01 Unhealthy dietary pattern with much room for improvement. 41-50 Dietary pattern unlikely to meet recommendations for good health and room for improvement. 51-60  More healthful dietary pattern, with some room for improvement.  >60 Healthy dietary pattern, although there may be some specific behaviors that could be improved.    Nutrition Goals Re-Evaluation:  Nutrition Goals Re-Evaluation     Row Name 01/25/24 1050 02/24/24 1456 03/27/24 1143         Goals   Current Weight 148 lb 13 oz (67.5 kg) 149 lb 14.6 oz (68 kg) 150 lb 5.7 oz (68.2 kg)     Comment cholesterol 217, triglycerides 245, HDL 27, LDL 141 no new labs; most recent labs  cholesterol 217, triglycerides 245, HDL 27, LDL 141 K+ WNL, other most recent labs cholesterol 217, triglycerides 245, HDL 27, LDL 141     Expected Outcome Goals in progress. Sulma has medical history of sarcoidosis, CAD, DM2, pulmonary embolism, hyperlipidemia. She continues regular follow-up with cardiology; lipid panel is not at goal. She was awarded financial assistance for Vascepa , Praluent , Nexletol  in January 2025. A1c remains well controlled. She has maintained her weight since starting with our program. Patient will benefit from participation in pulmonary rehab for nutrition, exercise, and lifestyle modification. Goals in progress. Chantall has medical history  of sarcoidosis, CAD, DM2, pulmonary embolism, hyperlipidemia. She continues regular follow-up with cardiology; lipid panel is not at goal. She was awarded financial assistance for Vascepa , Praluent , Nexletol  in January 2025. A1c remains well controlled. She is up 1.7# since starting with our program. Answered patient questions and gave resources for easy, grab and go breakfasts. Patient will benefit from participation in pulmonary rehab for nutrition, exercise, and lifestyle modification. Goals in progress. Warrene has medical history of sarcoidosis, CAD, DM2, pulmonary embolism, hyperlipidemia. She continues regular follow-up with cardiology; lipid panel is not at goal. She was awarded financial assistance for Vascepa , Praluent , Nexletol  in January 2025. A1c remains well controlled. She is up 2.2# since starting with our program. Answered patient questions and gave resources for easy, grab and go breakfasts. Patient was hospitalized on 03/12/24 for hyperkalemia and spironolactone  was stopped; at that time gave education on low potassium intake. Potassium has returned to normal (5/5 labs K+ 4.2); recommended liberalization of potassium intake at that time. Patient will benefit from participation in pulmonary rehab for nutrition, exercise, and lifestyle modification.              Nutrition Goals Discharge (Final Nutrition Goals Re-Evaluation):  Nutrition Goals Re-Evaluation - 03/27/24 1143       Goals   Current Weight 150 lb 5.7 oz (68.2 kg)    Comment K+ WNL, other most recent labs cholesterol 217, triglycerides 245, HDL 27, LDL 141    Expected Outcome Goals in progress. Lafreda has medical history of sarcoidosis, CAD, DM2, pulmonary embolism, hyperlipidemia. She continues regular follow-up with cardiology; lipid panel is not at goal. She was awarded financial assistance for Vascepa , Praluent , Nexletol  in January 2025. A1c remains well controlled. She is up 2.2# since starting with our program. Answered  patient questions and gave resources for easy, grab and go breakfasts. Patient was hospitalized on 03/12/24 for hyperkalemia and spironolactone  was stopped; at that time gave education on low potassium intake. Potassium has returned to normal (5/5 labs K+ 4.2); recommended liberalization of potassium intake at that time. Patient will benefit from participation in pulmonary rehab for nutrition, exercise, and lifestyle modification.             Psychosocial: Target Goals: Acknowledge presence or absence of significant depression and/or stress, maximize coping skills, provide positive support system. Participant is able  to verbalize types and ability to use techniques and skills needed for reducing stress and depression.  Initial Review & Psychosocial Screening:  Initial Psych Review & Screening - 01/02/24 1334       Initial Review   Current issues with None Identified      Family Dynamics   Good Support System? Yes      Barriers   Psychosocial barriers to participate in program There are no identifiable barriers or psychosocial needs.      Screening Interventions   Interventions Encouraged to exercise    Expected Outcomes Short Term goal: Utilizing psychosocial counselor, staff and physician to assist with identification of specific Stressors or current issues interfering with healing process. Setting desired goal for each stressor or current issue identified.;Long Term Goal: Stressors or current issues are controlled or eliminated.;Short Term goal: Identification and review with participant of any Quality of Life or Depression concerns found by scoring the questionnaire.;Long Term goal: The participant improves quality of Life and PHQ9 Scores as seen by post scores and/or verbalization of changes             Quality of Life Scores:  Scores of 19 and below usually indicate a poorer quality of life in these areas.  A difference of  2-3 points is a clinically meaningful difference.  A  difference of 2-3 points in the total score of the Quality of Life Index has been associated with significant improvement in overall quality of life, self-image, physical symptoms, and general health in studies assessing change in quality of life.  PHQ-9: Review Flowsheet  More data may exist      01/02/2024 09/03/2020 10/25/2017 07/20/2017 12/03/2014  Depression screen PHQ 2/9  Decreased Interest 0 0 0 0 0  Down, Depressed, Hopeless 0 0 0 0 0  PHQ - 2 Score 0 0 0 0 0  Altered sleeping 1 - - - -  Tired, decreased energy 0 - - - -  Change in appetite 0 - - - -  Feeling bad or failure about yourself  0 - - - -  Trouble concentrating 0 - - - -  Moving slowly or fidgety/restless 0 - - - -  Suicidal thoughts 0 - - - -  PHQ-9 Score 1 - - - -  Difficult doing work/chores Not difficult at all - - - -   Interpretation of Total Score  Total Score Depression Severity:  1-4 = Minimal depression, 5-9 = Mild depression, 10-14 = Moderate depression, 15-19 = Moderately severe depression, 20-27 = Severe depression   Psychosocial Evaluation and Intervention:  Psychosocial Evaluation - 01/02/24 1335       Psychosocial Evaluation & Interventions   Interventions Encouraged to exercise with the program and follow exercise prescription    Comments Markeria denies any psychosocial needs at this time    Expected Outcomes Divinity will be free of any psy/soc barriers or concerns and will exhibit positive outlook with good coping skills.    Continue Psychosocial Services  No Follow up required             Psychosocial Re-Evaluation:  Psychosocial Re-Evaluation     Row Name 01/03/24 1610 01/25/24 1114 02/20/24 1412 03/20/24 0855       Psychosocial Re-Evaluation   Current issues with None Identified None Identified None Identified None Identified    Comments No changes since orientation. Domonic is scheduled to start the program on 01/10/24 Smantha denies any new psychosocial barriers or concerns at this  time.  Oline denies any new psychosocial barriers or concerns at this time. Joon denies any new psychosocial barriers or concerns at this time.    Expected Outcomes Veeksha will exhibit positive outlook wtih good coping skills and be free of any psy/soc barriers or concerns For Meriyah to participate in PR free of any psychosocial barriers or concerns For Kentara to participate in PR free of any psychosocial barriers or concerns For Tawania to participate in PR free of any psychosocial barriers or concerns    Interventions Encouraged to attend Pulmonary Rehabilitation for the exercise Encouraged to attend Pulmonary Rehabilitation for the exercise Encouraged to attend Pulmonary Rehabilitation for the exercise Encouraged to attend Pulmonary Rehabilitation for the exercise    Continue Psychosocial Services  No Follow up required No Follow up required No Follow up required No Follow up required             Psychosocial Discharge (Final Psychosocial Re-Evaluation):  Psychosocial Re-Evaluation - 03/20/24 0855       Psychosocial Re-Evaluation   Current issues with None Identified    Comments Cherity denies any new psychosocial barriers or concerns at this time.    Expected Outcomes For Albirtha to participate in PR free of any psychosocial barriers or concerns    Interventions Encouraged to attend Pulmonary Rehabilitation for the exercise    Continue Psychosocial Services  No Follow up required             Education: Education Goals: Education classes will be provided on a weekly basis, covering required topics. Participant will state understanding/return demonstration of topics presented.  Learning Barriers/Preferences:  Learning Barriers/Preferences - 01/02/24 1529       Learning Barriers/Preferences   Learning Barriers Sight    Learning Preferences Pictoral;Group Instruction;Individual Instruction;Verbal Instruction;Written Material             Education Topics: Know Your  Numbers Group instruction that is supported by a PowerPoint presentation. Instructor discusses importance of knowing and understanding resting, exercise, and post-exercise oxygen  saturation, heart rate, and blood pressure. Oxygen  saturation, heart rate, blood pressure, rating of perceived exertion, and dyspnea are reviewed along with a normal range for these values.    Exercise for the Pulmonary Patient Group instruction that is supported by a PowerPoint presentation. Instructor discusses benefits of exercise, core components of exercise, frequency, duration, and intensity of an exercise routine, importance of utilizing pulse oximetry during exercise, safety while exercising, and options of places to exercise outside of rehab.  Flowsheet Row PULMONARY REHAB OTHER RESPIRATORY from 02/09/2024 in [facility] for Heart, Vascular, & Lung Health  Date 02/09/24  Educator EP  Instruction Review Code 1- Verbalizes Understanding       MET Level  Group instruction provided by PowerPoint, verbal discussion, and written material to support subject matter. Instructor reviews what METs are and how to increase METs.    Pulmonary Medications Verbally interactive group education provided by instructor with focus on inhaled medications and proper administration. Flowsheet Row PULMONARY REHAB OTHER RESPIRATORY from 02/02/2024 in [facility] for Heart, Vascular, & Lung Health  Date 02/02/24  Educator RT  Instruction Review Code 1- Verbalizes Understanding       Anatomy and Physiology of the Respiratory System Group instruction provided by PowerPoint, verbal discussion, and written material to support subject matter. Instructor reviews respiratory cycle and anatomical components of the respiratory system and their functions. Instructor also reviews differences in obstructive and restrictive respiratory diseases with examples of each.  Flowsheet Row PULMONARY  REHAB OTHER RESPIRATORY from 01/19/2024 in [facility] for Heart, Vascular, & Lung Health  Date 01/19/24  Educator RT  Instruction Review Code 1- Verbalizes Understanding       Oxygen  Safety Group instruction provided by PowerPoint, verbal discussion, and written material to support subject matter. There is an overview of "What is Oxygen " and "Why do we need it".  Instructor also reviews how to create a safe environment for oxygen  use, the importance of using oxygen  as prescribed, and the risks of noncompliance. There is a brief discussion on traveling with oxygen  and resources the patient may utilize. Flowsheet Row PULMONARY REHAB OTHER RESPIRATORY from 02/23/2024 in [facility] for Heart, Vascular, & Lung Health  Date 02/23/24  Educator RN  Instruction Review Code 1- Verbalizes Understanding       Oxygen  Use Group instruction provided by PowerPoint, verbal discussion, and written material to discuss how supplemental oxygen  is prescribed and different types of oxygen  supply systems. Resources for more information are provided.  Flowsheet Row PULMONARY REHAB OTHER RESPIRATORY from 03/01/2024 in [facility] for Heart, Vascular, & Lung Health  Date 03/01/24  Educator RT  Instruction Review Code 1- Verbalizes Understanding       Breathing Techniques Group instruction that is supported by demonstration and informational handouts. Instructor discusses the benefits of pursed lip and diaphragmatic breathing and detailed demonstration on how to perform both.  Flowsheet Row PULMONARY REHAB OTHER RESPIRATORY from 03/08/2024 in [facility] for Heart, Vascular, & Lung Health  Date 03/08/24  Educator RN  Instruction Review Code 1- Verbalizes Understanding        Risk Factor Reduction Group instruction that is supported by a PowerPoint presentation. Instructor discusses the definition of a risk factor,  different risk factors for pulmonary disease, and how the heart and lungs work together.   Pulmonary Diseases Group instruction provided by PowerPoint, verbal discussion, and written material to support subject matter. Instructor gives an overview of the different type of pulmonary diseases. There is also a discussion on risk factors and symptoms as well as ways to manage the diseases. Flowsheet Row PULMONARY REHAB OTHER RESPIRATORY from 01/12/2024 in [facility] for Heart, Vascular, & Lung Health  Date 01/12/24  Educator RT  Instruction Review Code 1- Verbalizes Understanding       Stress and Energy Conservation Group instruction provided by PowerPoint, verbal discussion, and written material to support subject matter. Instructor gives an overview of stress and the impact it can have on the body. Instructor also reviews ways to reduce stress. There is also a discussion on energy conservation and ways to conserve energy throughout the day. Flowsheet Row PULMONARY REHAB OTHER RESPIRATORY from 03/15/2024 in [facility] for Heart, Vascular, & Lung Health  Date 03/15/24  Educator RN  Instruction Review Code 1- Verbalizes Understanding       Warning Signs and Symptoms Group instruction provided by PowerPoint, verbal discussion, and written material to support subject matter. Instructor reviews warning signs and symptoms of stroke, heart attack, cold and flu. Instructor also reviews ways to prevent the spread of infection. Flowsheet Row PULMONARY REHAB OTHER RESPIRATORY from 03/22/2024 in [facility] for Heart, Vascular, & Lung Health  Date 03/22/24  Educator RN  Instruction Review Code 1- Verbalizes Understanding       Other Education Group or individual verbal, written, or video instructions that support  the educational goals of the pulmonary rehab program. Flowsheet Row PULMONARY REHAB OTHER RESPIRATORY from 01/26/2024  in [facility] for Heart, Vascular, & Lung Health  Date 01/26/24  Educator EP  Instruction Review Code 1- Verbalizes Understanding        Knowledge Questionnaire Score:  Knowledge Questionnaire Score - 01/02/24 1425       Knowledge Questionnaire Score   Pre Score 16/18             Core Components/Risk Factors/Patient Goals at Admission:  Personal Goals and Risk Factors at Admission - 01/02/24 1434       Core Components/Risk Factors/Patient Goals on Admission   Improve shortness of breath with ADL's Yes    Intervention Provide education, individualized exercise plan and daily activity instruction to help decrease symptoms of SOB with activities of daily living.    Expected Outcomes Short Term: Improve cardiorespiratory fitness to achieve a reduction of symptoms when performing ADLs;Long Term: Be able to perform more ADLs without symptoms or delay the onset of symptoms             Core Components/Risk Factors/Patient Goals Review:   Goals and Risk Factor Review     Row Name 01/03/24 1612 01/25/24 1116 02/20/24 1412 03/20/24 0903       Core Components/Risk Factors/Patient Goals Review   Personal Goals Review Improve shortness of breath with ADL's;Develop more efficient breathing techniques such as purse lipped breathing and diaphragmatic breathing and practicing self-pacing with activity. Improve shortness of breath with ADL's;Develop more efficient breathing techniques such as purse lipped breathing and diaphragmatic breathing and practicing self-pacing with activity. Improve shortness of breath with ADL's;Develop more efficient breathing techniques such as purse lipped breathing and diaphragmatic breathing and practicing self-pacing with activity. Improve shortness of breath with ADL's    Review Unable to assess, pt has not started the program yet. Kaliyan is scheduled to start the program next week Goal progressing for improving shortness of breath  with ADL's. Kenika is currently able to maintain sats >88% on RA while exercising. She is currently exercising on the recumbant bike and the Nustep. She is gradually increasing her MET's. Goal progressing for developing more efficient breathing techniques such as purse lipped breathing and diaphragmatic breathing; and practicing self-pacing with activity. We will continue to monitor Marylouise's progress throughout the program. Monthly review of patient's Core Components/Risk Factors/Patient Goals are as follows: Goal progressing for improving shortness of breath with ADL's. Marizza is currently able to maintain sats >88% on RA while exercising. She is currently exercising on the recumbant bike and the Nustep. Goal met for developing more efficient breathing techniques such as purse lipped breathing and diaphragmatic breathing; and practicing self-pacing with activity. She is able to demonstrate purse lip breathing when she gets short of breath. She also knows how to pace herself based on her rate of perceived exertion score. We will continue to monitor Nikiah's progress throughout the program. Monthly review of patient's Core Components/Risk Factors/Patient Goals are as follows: Goal progressing for improving shortness of breath with ADL's. Sharman is currently able to maintain sats >88% on RA while exercising. She is currently exercising on the recumbant bike and the Nustep.  We will continue to monitor Kalla's progress throughout the program.    Expected Outcomes Kaylanni will improve her shortness of breath with ADLs and develop more efficient breathing techniques such as purse lipped breathing and diaphragmatic breathing; and practicing self-pacing with activity Sarsha will improve her shortness of breath  with ADLs and develop more efficient breathing techniques such as purse lipped breathing and diaphragmatic breathing; and practicing self-pacing with activity To  improve her shortness of breath with ADLs To   improve her shortness of breath with ADLs             Core Components/Risk Factors/Patient Goals at Discharge (Final Review):   Goals and Risk Factor Review - 03/20/24 0903       Core Components/Risk Factors/Patient Goals Review   Personal Goals Review Improve shortness of breath with ADL's    Review Monthly review of patient's Core Components/Risk Factors/Patient Goals are as follows: Goal progressing for improving shortness of breath with ADL's. Taffi is currently able to maintain sats >88% on RA while exercising. She is currently exercising on the recumbant bike and the Nustep.  We will continue to monitor Seham's progress throughout the program.    Expected Outcomes To  improve her shortness of breath with ADLs             ITP Comments:Pt is making expected progress toward Pulmonary Rehab goals after completing 21 session(s). Recommend continued exercise, life style modification, education, and utilization of breathing techniques to increase stamina and strength, while also decreasing shortness of breath with exertion.  Dr. Genetta Kenning is Medical Director for Pulmonary Rehab at [facility].     Comments:

## 2024-03-29 ENCOUNTER — Telehealth (HOSPITAL_COMMUNITY): Payer: Self-pay

## 2024-03-29 ENCOUNTER — Encounter (HOSPITAL_COMMUNITY): Admission: RE | Admit: 2024-03-29 | Payer: Medicare PPO | Source: Ambulatory Visit

## 2024-03-29 NOTE — Telephone Encounter (Signed)
 Patient c/o for 10:15am class at 10:15am, will be in next Tuesday.

## 2024-04-02 DIAGNOSIS — L209 Atopic dermatitis, unspecified: Secondary | ICD-10-CM | POA: Diagnosis not present

## 2024-04-03 ENCOUNTER — Encounter (HOSPITAL_COMMUNITY)
Admission: RE | Admit: 2024-04-03 | Discharge: 2024-04-03 | Disposition: A | Discharge status: Home / Self Care | Source: Ambulatory Visit | Attending: Internal Medicine | Admitting: Internal Medicine

## 2024-04-03 ENCOUNTER — Encounter (HOSPITAL_COMMUNITY): Admission: RE | Admit: 2024-04-03 | Source: Ambulatory Visit

## 2024-04-03 DIAGNOSIS — J9611 Chronic respiratory failure with hypoxia: Secondary | ICD-10-CM

## 2024-04-03 DIAGNOSIS — I2699 Other pulmonary embolism without acute cor pulmonale: Secondary | ICD-10-CM

## 2024-04-03 NOTE — Progress Notes (Signed)
 Daily Session Note  Patient Details  Name: Jocelyn [name] MRN: [id number] Date of Birth: [birth year] Referring Provider:   Gattis Kass Pulmonary Rehab Walk Test from 01/02/2024 in [facility]  Referring Provider Ramaswamy       Encounter Date: 04/03/2024  Check In:  Session Check In - 04/03/24 1425       Check-In   Supervising physician immediately available to respond to emergencies [facility] MD immediately available    Physician(s) Palmer Bobo, NP    Location [facility]    Staff Present Cindra Cree, Emilio Harder, MS, ACSM-CEP, Exercise Physiologist;Randi Rochelle Chu, ACSM-CEP, Exercise Physiologist;Mary Arlester Ladd, RN, BSN    Virtual Visit No    Medication changes reported     No    Fall or balance concerns reported    No    Tobacco Cessation No Change    Warm-up and Cool-down Performed as group-led instruction    Resistance Training Performed Yes    VAD Patient? No    PAD/SET Patient? No      Pain Assessment   Currently in Pain? No/denies    Pain Score 0-No pain    Multiple Pain Sites No             Capillary Blood Glucose: No results found. However, due to the size of the patient record, not all encounters were searched. Please check Results Review for a complete set of results.    Social History   Tobacco Use  Smoking Status Never  Smokeless Tobacco Never  Tobacco Comments   Daily Caffeine - 1  Exercise 2-3 times/weekly    Goals Met:  Proper associated with RPD/PD & O2 Sat Independence with exercise equipment Exercise tolerated well No report of concerns or symptoms today Strength training completed today  Goals Unmet:  Not Applicable  Comments: Service time is from 1320 to 1440.    Dr. Genetta Kenning is Medical Director for Pulmonary Rehab at [facility].

## 2024-04-04 ENCOUNTER — Other Ambulatory Visit (HOSPITAL_COMMUNITY): Payer: Self-pay

## 2024-04-04 NOTE — Progress Notes (Signed)
 Paramedicine Encounter    Patient ID: Jocelyn [name], female    DOB: Jun 02, 1956, 68 y.o.   MRN: [id number]   Complaints-dealing with spider bites   Edema-none   Compliance with meds-yes   Pill box filled-yes  If so, by whom-pt filled it   Refills needed-n/a     Pt reports she has been dealing with some type of insect bites. Her grass is very tall in the yard and she has been out there.  Someone was suppose to come mow her grass.  She went to PCP on Monday for all this. This started on 5/6. She showed me pics and they are large places all over her body-legs, back, arms--and continue to get them. She has sprayed her home and car down to. She felt something bite her today and was able to squish it and seen it was a spider.  Her doc gave her antibiotic and prednisone  and cream to use.   She denies increased sob, no dizziness, no c/p.  She is becoming more active.  No more med changes.  Still doing pulm rehab.  She did have episode of sob on Sunday walking outside at a park.  No bleeding issues.  Meds verified.  No other issues or concerns at this time. Sent message to triage clinic ref another referral for kidney doc.    BP 108/70   Resp 16   Wt 143 lb (64.9 kg)   SpO2 99%   BMI 26.16 kg/m  Weight yesterday-Monday-145 Last visit weight-149  Patient Care Team: Jonathon Neighbors, MD as PCP - General (Family Medicine) Odie Benne, MD as PCP - Cardiology (Cardiology)  Patient Active Problem List   Diagnosis Date Noted   Physical deconditioning 08/23/2023   Acute respiratory failure with hypoxia (HCC) 08/05/2023   Pulmonary embolism (HCC) 08/03/2023   Pulmonary edema 08/03/2023   Degenerative arthritis of knee 08/20/2021   Hx of pleural effusion    Dyspnea 08/16/2018   Fall on or from sidewalk curb, initial encounter 05/31/2018   History of acute inferior wall MI 08/22/2017   Diabetes mellitus (HCC)    S/P right coronary artery (RCA) stent placement     Overweight 05/05/2017   Diabetes mellitus type 2, controlled, without complications (HCC) 12/16/2016   Asthmatic bronchitis , chronic (HCC) 05/10/2016   Polymyositis (HCC) 09/15/2015   History of sarcoidosis 09/15/2015   Falling 03/13/2015   Edema 02/13/2014   Coronary artery disease involving native heart without angina pectoris 10/29/2013   Hyperlipidemia 10/29/2013   Iron (Fe) deficiency anemia 08/08/2013   Vitamin B 12 deficiency 08/08/2013   GERD (gastroesophageal reflux disease) 09/08/2011   Weakness 02/22/2011   Depression 02/22/2011   MENIERE'S DISEASE 04/30/2009   Vitamin D  deficiency 02/05/2009   Venous (peripheral) insufficiency 12/12/2007   RENAL CALCULUS 12/12/2007   DIZZINESS 12/12/2007   HYPERCHOLESTEROLEMIA 09/16/2007   Anxiety state 09/16/2007   HYPERTENSION, BENIGN 09/16/2007   Allergic rhinitis 09/16/2007   IRRITABLE BOWEL SYNDROME 09/16/2007   Myalgia and myositis 09/16/2007    Current Outpatient Medications:    allopurinol (ZYLOPRIM) 100 MG tablet, Take 1 tablet by mouth daily as needed., Disp: , Rfl:    ALPRAZolam  (XANAX ) 0.5 MG tablet, Take 0.5 mg by mouth 3 (three) times daily., Disp: , Rfl:    apixaban  (ELIQUIS ) 5 MG TABS tablet, Take 10 mg by mouth twice daily, then on 08/17/2023-switch to 5 mg by mouth twice daily., Disp: 180 tablet, Rfl: 3   azaTHIOprine  (IMURAN ) 50 MG tablet,  Take 1 tablet by mouth daily., Disp: , Rfl:    Bempedoic Acid  (NEXLETOL ) 180 MG TABS, Take 1 tablet (180 mg total) by mouth daily., Disp: 90 tablet, Rfl: 3   empagliflozin  (JARDIANCE ) 25 MG TABS tablet, Take 1 tablet (25 mg total) by mouth daily before breakfast., Disp: 90 tablet, Rfl: 3   GEMTESA 75 MG TABS, Take 1 tablet by mouth daily., Disp: , Rfl:    icosapent  Ethyl (VASCEPA ) 1 g capsule, TAKE 2 CAPSULES(2 GRAMS) BY MOUTH TWICE DAILY, Disp: 360 capsule, Rfl: 3   metFORMIN  (GLUCOPHAGE -XR) 500 MG 24 hr tablet, Take 2 tablets (1,000 mg total) by mouth daily with breakfast., Disp:  180 tablet, Rfl: 3   metoprolol  succinate (TOPROL -XL) 100 MG 24 hr tablet, Take 1 tablet (100 mg total) by mouth daily. Take with or immediately following a meal., Disp: 90 tablet, Rfl: 3   nitroGLYCERIN  (NITROSTAT ) 0.4 MG SL tablet, Place 1 tablet (0.4 mg total) under the tongue every 5 (five) minutes as needed for chest pain., Disp: 25 tablet, Rfl: 2   ondansetron  (ZOFRAN ) 4 MG tablet, Take 1 tablet (4 mg total) by mouth every 6 (six) hours., Disp: 12 tablet, Rfl: 0   ONETOUCH VERIO test strip, CHECK BLOOD SUGAR TID AS DIRECTED, Disp: , Rfl:    pantoprazole  (PROTONIX ) 40 MG tablet, Take 1 tablet (40 mg total) by mouth daily., Disp: 30 tablet, Rfl: 0   PRALUENT  150 MG/ML SOAJ, ADMINISTER 1 ML UNDER THE SKIN EVERY 14 DAYS, Disp: 6 mL, Rfl: 3   spironolactone  (ALDACTONE ) 25 MG tablet, TAKE 1 TABLET BY MOUTH EVERY DAY, Disp: 90 tablet, Rfl: 2 Allergies  Allergen Reactions   Actos [Pioglitazone] Other (See Comments)    REACTION: pt states INTOL w/ edema   Codeine  Other (See Comments)    REACTION: vomiting   Januvia [Sitagliptin] Nausea Only   Lactose Intolerance (Gi) Diarrhea   Morphine Nausea Only and Nausea And Vomiting    REACTION: vomiting REACTION: vomiting   Repatha  [Evolocumab ]     WAS NOT EFFECTIVE IN LOWERING LDL      Social History   Socioeconomic History   Marital status: Single    Spouse name: Not on file   Number of children: 1   Years of education: Not on file   Highest education level: Master's degree (e.g., MA, MS, MEng, MEd, MSW, MBA)  Occupational History   Occupation: Retired Runner, broadcasting/film/video   Tobacco Use   Smoking status: Never   Smokeless tobacco: Never   Tobacco comments:    Daily Caffeine - 1  Exercise 2-3 times/weekly  Vaping Use   Vaping status: Never Used  Substance and Sexual Activity   Alcohol use: No    Alcohol/week: 0.0 standard drinks of alcohol   Drug use: No   Sexual activity: Yes    Birth control/protection: None  Other Topics Concern   Not on  file  Social History Narrative   Exercises 2-3 times weekly   Caffeine Use: 1 daily (tea or Pepsi)   Lives alone in a one story home.  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  Retired Midwife.     Social Drivers of Corporate investment banker Strain: Low Risk  (08/05/2023)   Overall Financial Resource Strain (CARDIA)    Difficulty of Paying Living Expenses: Not hard at all  Food Insecurity: No Food Insecurity (08/05/2023)   Hunger Vital Sign    Worried About Running Out of Food in the Last Year: Never true    Ran  Out of Food in the Last Year: Never true  Transportation Needs: No Transportation Needs (08/05/2023)   PRAPARE - Administrator, Civil Service (Medical): No    Lack of Transportation (Non-Medical): No  Physical Activity: Not on file  Stress: No Stress Concern Present (08/05/2023)   [employer] of Occupational Health - Occupational Stress Questionnaire    Feeling of Stress : Not at all  Social Connections: Not on file  Intimate Partner Violence: Not At Risk (08/05/2023)   Humiliation, Afraid, Rape, and Kick questionnaire    Fear of Current or Ex-Partner: No    Emotionally Abused: No    Physically Abused: No    Sexually Abused: No    Physical Exam      Future Appointments  Date Time Provider Department Center  04/12/2024  8:15 AM [facility] None  04/17/2024  8:15 AM [facility] None  06/06/2024  2:00 PM [facility] PA/NP [facility] None  07/30/2024  1:45 PM Cassandra Cleveland, MD [facility] None       Alfonza Angry, Paramedic [phone] [facility] Paramedic  04/04/24

## 2024-04-05 ENCOUNTER — Encounter (HOSPITAL_COMMUNITY): Admission: RE | Admit: 2024-04-05 | Source: Ambulatory Visit

## 2024-04-11 DIAGNOSIS — I2699 Other pulmonary embolism without acute cor pulmonale: Secondary | ICD-10-CM | POA: Diagnosis not present

## 2024-04-12 ENCOUNTER — Encounter (HOSPITAL_COMMUNITY)
Admission: RE | Admit: 2024-04-12 | Discharge: 2024-04-12 | Disposition: A | Discharge status: Home / Self Care | Source: Ambulatory Visit | Attending: Internal Medicine | Admitting: Internal Medicine

## 2024-04-12 DIAGNOSIS — J9611 Chronic respiratory failure with hypoxia: Secondary | ICD-10-CM | POA: Diagnosis not present

## 2024-04-12 DIAGNOSIS — I2699 Other pulmonary embolism without acute cor pulmonale: Secondary | ICD-10-CM | POA: Diagnosis not present

## 2024-04-12 NOTE — Progress Notes (Signed)
 Daily Session Note  Patient Details  Name: Jocelyn [name] MRN: [id number] Date of Birth: 1956/01/30 Referring Provider:   Gattis Kass Pulmonary Rehab Walk Test from 01/02/2024 in [facility]  Referring Provider Ramaswamy       Encounter Date: 04/12/2024  Check In:  Session Check In - 04/12/24 4098       Check-In   Supervising physician immediately available to respond to emergencies [facility] MD immediately available    Physician(s) Marlana Silvan, NP    Location [facility]    Staff Present Cindra Cree, Emilio Harder, MS, ACSM-CEP, Exercise Physiologist;Randi Rochelle Chu, ACSM-CEP, Exercise Physiologist;Mary Arlester Ladd, RN, BSN    Virtual Visit No    Medication changes reported     No    Fall or balance concerns reported    No    Tobacco Cessation No Change    Warm-up and Cool-down Performed as group-led instruction    Resistance Training Performed Yes    VAD Patient? No    PAD/SET Patient? No      Pain Assessment   Currently in Pain? No/denies    Pain Score 0-No pain    Multiple Pain Sites No             Capillary Blood Glucose: No results found. However, due to the size of the patient record, not all encounters were searched. Please check Results Review for a complete set of results.    Social History   Tobacco Use  Smoking Status Never  Smokeless Tobacco Never  Tobacco Comments   Daily Caffeine - 1  Exercise 2-3 times/weekly    Goals Met:  Proper associated with RPD/PD & O2 Sat Independence with exercise equipment Exercise tolerated well No report of concerns or symptoms today Strength training completed today  Goals Unmet:  Not Applicable  Comments: Service time is from 0816 to 0932.    Dr. Genetta Kenning is Medical Director for Pulmonary Rehab at [facility].

## 2024-04-17 ENCOUNTER — Encounter (HOSPITAL_COMMUNITY)
Admission: RE | Admit: 2024-04-17 | Discharge: 2024-04-17 | Disposition: A | Discharge status: Home / Self Care | Source: Ambulatory Visit | Attending: Internal Medicine | Admitting: Internal Medicine

## 2024-04-17 DIAGNOSIS — I2699 Other pulmonary embolism without acute cor pulmonale: Secondary | ICD-10-CM | POA: Insufficient documentation

## 2024-04-17 DIAGNOSIS — J9611 Chronic respiratory failure with hypoxia: Secondary | ICD-10-CM | POA: Diagnosis not present

## 2024-04-17 NOTE — Progress Notes (Signed)
 Daily Session Note  Patient Details  Name: Jocelyn [name] MRN: [id number] Date of Birth: September 29, 1956 Referring Provider:   Gattis Kass Pulmonary Rehab Walk Test from 01/02/2024 in [facility]  Referring Provider Ramaswamy       Encounter Date: 04/17/2024  Check In:  Session Check In - 04/17/24 0825       Check-In   Supervising physician immediately available to respond to emergencies [facility] MD immediately available    Physician(s) Koren Persons, NP    Location [facility]    Staff Present Cindra Cree, Emilio Harder, MS, ACSM-CEP, Exercise Physiologist;Randi Rochelle Chu, ACSM-CEP, Exercise Physiologist    Virtual Visit No    Medication changes reported     No    Fall or balance concerns reported    No    Tobacco Cessation No Change    Warm-up and Cool-down Performed as group-led instruction    Resistance Training Performed Yes    VAD Patient? No    PAD/SET Patient? No      Pain Assessment   Currently in Pain? No/denies    Multiple Pain Sites No             Capillary Blood Glucose: No results found. However, due to the size of the patient record, not all encounters were searched. Please check Results Review for a complete set of results.    Social History   Tobacco Use  Smoking Status Never  Smokeless Tobacco Never  Tobacco Comments   Daily Caffeine - 1  Exercise 2-3 times/weekly    Goals Met:  Proper associated with RPD/PD & O2 Sat Independence with exercise equipment Exercise tolerated well No report of concerns or symptoms today Strength training completed today  Goals Unmet:  Not Applicable  Comments: Service time is from 0809 to 0924.    Dr. Genetta Kenning is Medical Director for Pulmonary Rehab at [facility].

## 2024-04-18 ENCOUNTER — Other Ambulatory Visit (HOSPITAL_COMMUNITY): Payer: Self-pay

## 2024-04-18 NOTE — Progress Notes (Signed)
 Paramedicine Encounter    Patient ID: Jocelyn [name], female    DOB: 04/22/1956, 68 y.o.   MRN: [id number]   Complaints-denies   Edema-none   Compliance with meds-yes   Pill box filled-yes  If so, by whom-she does it  Refills needed-eliquis - she called it in today   Pt reports she is doing good.  She has not heard anything back ref kidney referral. Sent another message to clinic ref this.  She reports her breathing is doing ok. She has graduated from [employer]. She wants to keep doing exercise, I advised her to reach out to [employer] to see if she is eligible for silver sneakers.  No c/p, no dizziness, no bleeding issues.  Her weight is up 4lbs but she just finished prednisone .   She fills her own pill box without any issues.   Kidney referral is being sent back in per clinic.  Will f/u in a few wks.    BP 102/64   Pulse 82   Resp 16   Wt 147 lb (66.7 kg)   SpO2 98%   BMI 26.89 kg/m  Weight yesterday- Last visit weight-143  Patient Care Team: Jonathon Neighbors, MD as PCP - General (Family Medicine) Odie Benne, MD as PCP - Cardiology (Cardiology)  Patient Active Problem List   Diagnosis Date Noted   Physical deconditioning 08/23/2023   Acute respiratory failure with hypoxia (HCC) 08/05/2023   Pulmonary embolism (HCC) 08/03/2023   Pulmonary edema 08/03/2023   Degenerative arthritis of knee 08/20/2021   Hx of pleural effusion    Dyspnea 08/16/2018   Fall on or from sidewalk curb, initial encounter 05/31/2018   History of acute inferior wall MI 08/22/2017   Diabetes mellitus (HCC)    S/P right coronary artery (RCA) stent placement    Overweight 05/05/2017   Diabetes mellitus type 2, controlled, without complications (HCC) 12/16/2016   Asthmatic bronchitis , chronic (HCC) 05/10/2016   Polymyositis (HCC) 09/15/2015   History of sarcoidosis 09/15/2015   Falling 03/13/2015   Edema 02/13/2014   Coronary artery disease involving native heart without angina  pectoris 10/29/2013   Hyperlipidemia 10/29/2013   Iron (Fe) deficiency anemia 08/08/2013   Vitamin B 12 deficiency 08/08/2013   GERD (gastroesophageal reflux disease) 09/08/2011   Weakness 02/22/2011   Depression 02/22/2011   MENIERE'S DISEASE 04/30/2009   Vitamin D  deficiency 02/05/2009   Venous (peripheral) insufficiency 12/12/2007   RENAL CALCULUS 12/12/2007   DIZZINESS 12/12/2007   HYPERCHOLESTEROLEMIA 09/16/2007   Anxiety state 09/16/2007   HYPERTENSION, BENIGN 09/16/2007   Allergic rhinitis 09/16/2007   IRRITABLE BOWEL SYNDROME 09/16/2007   Myalgia and myositis 09/16/2007    Current Outpatient Medications:    allopurinol (ZYLOPRIM) 100 MG tablet, Take 1 tablet by mouth daily as needed., Disp: , Rfl:    ALPRAZolam  (XANAX ) 0.5 MG tablet, Take 0.5 mg by mouth 3 (three) times daily., Disp: , Rfl:    apixaban  (ELIQUIS ) 5 MG TABS tablet, Take 10 mg by mouth twice daily, then on 08/17/2023-switch to 5 mg by mouth twice daily., Disp: 180 tablet, Rfl: 3   azaTHIOprine  (IMURAN ) 50 MG tablet, Take 1 tablet by mouth daily., Disp: , Rfl:    Bempedoic Acid  (NEXLETOL ) 180 MG TABS, Take 1 tablet (180 mg total) by mouth daily., Disp: 90 tablet, Rfl: 3   empagliflozin  (JARDIANCE ) 25 MG TABS tablet, Take 1 tablet (25 mg total) by mouth daily before breakfast., Disp: 90 tablet, Rfl: 3   GEMTESA 75 MG TABS, Take  1 tablet by mouth daily., Disp: , Rfl:    icosapent  Ethyl (VASCEPA ) 1 g capsule, TAKE 2 CAPSULES(2 GRAMS) BY MOUTH TWICE DAILY, Disp: 360 capsule, Rfl: 3   metFORMIN  (GLUCOPHAGE -XR) 500 MG 24 hr tablet, Take 2 tablets (1,000 mg total) by mouth daily with breakfast., Disp: 180 tablet, Rfl: 3   metoprolol  succinate (TOPROL -XL) 100 MG 24 hr tablet, Take 1 tablet (100 mg total) by mouth daily. Take with or immediately following a meal., Disp: 90 tablet, Rfl: 3   PRALUENT  150 MG/ML SOAJ, ADMINISTER 1 ML UNDER THE SKIN EVERY 14 DAYS, Disp: 6 mL, Rfl: 3   nitroGLYCERIN  (NITROSTAT ) 0.4 MG SL tablet,  Place 1 tablet (0.4 mg total) under the tongue every 5 (five) minutes as needed for chest pain., Disp: 25 tablet, Rfl: 2   ondansetron  (ZOFRAN ) 4 MG tablet, Take 1 tablet (4 mg total) by mouth every 6 (six) hours., Disp: 12 tablet, Rfl: 0   ONETOUCH VERIO test strip, CHECK BLOOD SUGAR TID AS DIRECTED, Disp: , Rfl:    pantoprazole  (PROTONIX ) 40 MG tablet, Take 1 tablet (40 mg total) by mouth daily. (Patient not taking: Reported on 04/18/2024), Disp: 30 tablet, Rfl: 0   spironolactone  (ALDACTONE ) 25 MG tablet, TAKE 1 TABLET BY MOUTH EVERY DAY (Patient not taking: Reported on 04/18/2024), Disp: 90 tablet, Rfl: 2 Allergies  Allergen Reactions   Actos [Pioglitazone] Other (See Comments)    REACTION: pt states INTOL w/ edema   Codeine  Other (See Comments)    REACTION: vomiting   Januvia [Sitagliptin] Nausea Only   Lactose Intolerance (Gi) Diarrhea   Morphine Nausea Only and Nausea And Vomiting    REACTION: vomiting REACTION: vomiting   Repatha  [Evolocumab ]     WAS NOT EFFECTIVE IN LOWERING LDL      Social History   Socioeconomic History   Marital status: Single    Spouse name: Not on file   Number of children: 1   Years of education: Not on file   Highest education level: Master's degree (e.g., MA, MS, MEng, MEd, MSW, MBA)  Occupational History   Occupation: Retired Runner, broadcasting/film/video   Tobacco Use   Smoking status: Never   Smokeless tobacco: Never   Tobacco comments:    Daily Caffeine - 1  Exercise 2-3 times/weekly  Vaping Use   Vaping status: Never Used  Substance and Sexual Activity   Alcohol use: No    Alcohol/week: 0.0 standard drinks of alcohol   Drug use: No   Sexual activity: Yes    Birth control/protection: None  Other Topics Concern   Not on file  Social History Narrative   Exercises 2-3 times weekly   Caffeine Use: 1 daily (tea or Pepsi)   Lives alone in a one story home.  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  Retired Midwife.     Social Drivers of Corporate investment banker  Strain: Low Risk  (08/05/2023)   Overall Financial Resource Strain (CARDIA)    Difficulty of Paying Living Expenses: Not hard at all  Food Insecurity: No Food Insecurity (08/05/2023)   Hunger Vital Sign    Worried About Running Out of Food in the Last Year: Never true    Ran Out of Food in the Last Year: Never true  Transportation Needs: No Transportation Needs (08/05/2023)   PRAPARE - Administrator, Civil Service (Medical): No    Lack of Transportation (Non-Medical): No  Physical Activity: Not on file  Stress: No Stress Concern Present (08/05/2023)  [employer] of Occupational Health - Occupational Stress Questionnaire    Feeling of Stress : Not at all  Social Connections: Not on file  Intimate Partner Violence: Not At Risk (08/05/2023)   Humiliation, Afraid, Rape, and Kick questionnaire    Fear of Current or Ex-Partner: No    Emotionally Abused: No    Physically Abused: No    Sexually Abused: No    Physical Exam      Future Appointments  Date Time Provider Department Center  06/06/2024  2:00 PM [facility] PA/NP [facility] None  07/30/2024  1:45 PM Cassandra Cleveland, MD [facility] None       Alfonza Angry, Paramedic [phone] [facility] Paramedic  04/18/24

## 2024-04-18 NOTE — Progress Notes (Signed)
 Discharge Progress Report  Patient Details  Name: Jocelyn [name] MRN: [id number] Date of Birth: 1956/03/05 Referring Provider:   Gattis Kass Pulmonary Rehab Walk Test from 01/02/2024 in [facility]  Referring Provider Ramaswamy        Number of Visits: 24  Reason for Discharge:  Patient has met program and personal goals.  Smoking History:  Social History   Tobacco Use  Smoking Status Never  Smokeless Tobacco Never  Tobacco Comments   Daily Caffeine - 1  Exercise 2-3 times/weekly    Diagnosis:  Pulmonary embolism, unspecified chronicity, unspecified pulmonary embolism type, unspecified whether acute cor pulmonale present (HCC)  Chronic respiratory failure with hypoxia (HCC)  ADL [facility]:  Pulmonary Assessment Scores     Row Name 01/02/24 1423 04/17/24 0947       ADL [facility]   ADL Phase Entry Exit    SOB Score total 39 16      CAT Score   CAT Score 16 16      mMRC Score   mMRC Score 3 2             Initial Exercise Prescription:  Initial Exercise Prescription - 01/02/24 1500       Date of Initial Exercise RX and Referring Provider   Date 01/02/24    Referring Provider Ramaswamy    Expected Discharge Date 03/29/24      NuStep   Level 1    SPM 60    Minutes 15    METs 2      Recumbant Elliptical   Level 1    RPM 60    Minutes 15    METs 2      Prescription Details   Frequency (times per week) 2    Duration Progress to 30 minutes of continuous aerobic without signs/symptoms of physical distress      Intensity   THRR 40-80% of Max Heartrate 60-121    Ratings of Perceived Exertion 11-13    Perceived Dyspnea 0-4      Progression   Progression Continue to progress workloads to maintain intensity without signs/symptoms of physical distress.      Resistance Training   Training Prescription Yes    Weight yellow bands    Reps 10-15             Discharge Exercise Prescription  (Final Exercise Prescription Changes):  Exercise Prescription Changes - 03/27/24 1100       Response to Exercise   Blood Pressure (Admit) 102/62    Blood Pressure (Exercise) 90/50    Blood Pressure (Exit) 100/58    Heart Rate (Admit) 79 bpm    Heart Rate (Exercise) 82 bpm    Heart Rate (Exit) 69 bpm    Oxygen  Saturation (Admit) 97 %    Oxygen  Saturation (Exercise) 97 %    Oxygen  Saturation (Exit) 97 %    Rating of Perceived Exertion (Exercise) 12    Perceived Dyspnea (Exercise) 1    Duration Continue with 30 min of aerobic exercise without signs/symptoms of physical distress.    Intensity THRR unchanged      Progression   Progression Continue to progress workloads to maintain intensity without signs/symptoms of physical distress.      Resistance Training   Training Prescription Yes    Weight red bands    Reps 10-15    Time 10 Minutes      Recumbant Bike   Level 3  RPM 40    Minutes 15    METs 2.4      NuStep   Level 3    SPM 87    Minutes 15    METs 1.9             Functional Capacity:  6 Minute Walk     Row Name 01/02/24 1507 04/17/24 1518       6 Minute Walk   Phase Initial Discharge    Distance 1060 feet 1250 feet    Distance % Change -- 17.92 %    Distance Feet Change -- 190 ft    Walk Time 6 minutes 6 minutes    # of Rest Breaks 2  brief rest 1:57-2:10, 4:42-4:50 0    MPH 2.01 2.37    METS 2.39 2.85    RPE 12 12    Perceived Dyspnea  1 2    VO2 Peak 8.35 9.99    Symptoms Yes (comment) No    Comments fatigue --    Resting HR 60 bpm 67 bpm    Resting BP 122/64 112/70    Resting Oxygen  Saturation  100 % 99 %    Exercise Oxygen  Saturation  during 6 min walk 100 % 96 %    Max Ex. HR 89 bpm 100 bpm    Max Ex. BP 136/60 142/66    2 Minute Post BP 150/70 124/64      Interval HR   1 Minute HR 83 92    2 Minute HR 89 98    3 Minute HR 84 100    4 Minute HR 89 100    5 Minute HR 84 98    6 Minute HR 86 98    2 Minute Post HR 59 76     Interval Heart Rate? Yes Yes      Interval Oxygen    Interval Oxygen ? Yes Yes    Baseline Oxygen  Saturation % 100 % 99 %    1 Minute Oxygen  Saturation % 100 % 98 %    1 Minute Liters of Oxygen  0 L 0 L    2 Minute Oxygen  Saturation % 100 % 96 %    2 Minute Liters of Oxygen  0 L 0 L    3 Minute Oxygen  Saturation % 100 % 97 %    3 Minute Liters of Oxygen  0 L 0 L    4 Minute Oxygen  Saturation % 100 % 97 %    4 Minute Liters of Oxygen  0 L 0 L    5 Minute Oxygen  Saturation % 100 % 98 %    5 Minute Liters of Oxygen  0 L 0 L    6 Minute Oxygen  Saturation % 100 % 99 %    6 Minute Liters of Oxygen  0 L 0 L    2 Minute Post Oxygen  Saturation % 100 % 99 %    2 Minute Post Liters of Oxygen  0 L 0 L             Psychological, QOL, Others - Outcomes: PHQ 2/9:    04/17/2024    9:46 AM 01/02/2024    2:26 PM 09/03/2020   10:45 AM 10/25/2017   12:06 PM 07/20/2017    1:38 PM  Depression screen PHQ 2/9  Decreased Interest 0 0 0 0 0  Down, Depressed, Hopeless 0 0 0 0 0  PHQ - 2 Score 0 0 0 0 0  Altered sleeping 1 1  Tired, decreased energy 1 0     Change in appetite 0 0     Feeling bad or failure about yourself  0 0     Trouble concentrating 0 0     Moving slowly or fidgety/restless 0 0     Suicidal thoughts 0 0     PHQ-9 Score 2 1     Difficult doing work/chores Not difficult at all Not difficult at all       Quality of Life:   Personal Goals: Goals established at orientation with interventions provided to work toward goal.  Personal Goals and Risk Factors at Admission - 01/02/24 1434       Core Components/Risk Factors/Patient Goals on Admission   Improve shortness of breath with ADL's Yes    Intervention Provide education, individualized exercise plan and daily activity instruction to help decrease symptoms of SOB with activities of daily living.    Expected Outcomes Short Term: Improve cardiorespiratory fitness to achieve a reduction of symptoms when performing ADLs;Long Term: Be  able to perform more ADLs without symptoms or delay the onset of symptoms              Personal Goals Discharge:  Goals and Risk Factor Review     Row Name 01/03/24 1612 01/25/24 1116 02/20/24 1412 03/20/24 0903 04/18/24 0817     Core Components/Risk Factors/Patient Goals Review   Personal Goals Review Improve shortness of breath with ADL's;Develop more efficient breathing techniques such as purse lipped breathing and diaphragmatic breathing and practicing self-pacing with activity. Improve shortness of breath with ADL's;Develop more efficient breathing techniques such as purse lipped breathing and diaphragmatic breathing and practicing self-pacing with activity. Improve shortness of breath with ADL's;Develop more efficient breathing techniques such as purse lipped breathing and diaphragmatic breathing and practicing self-pacing with activity. Improve shortness of breath with ADL's Improve shortness of breath with ADL's   Review Unable to assess, pt has not started the program yet. Mackensey is scheduled to start the program next week Goal progressing for improving shortness of breath with ADL's. Jaidalyn is currently able to maintain sats >88% on RA while exercising. She is currently exercising on the recumbant bike and the Nustep. She is gradually increasing her MET's. Goal progressing for developing more efficient breathing techniques such as purse lipped breathing and diaphragmatic breathing; and practicing self-pacing with activity. We will continue to monitor Sharona's progress throughout the program. Monthly review of patient's Core Components/Risk Factors/Patient Goals are as follows: Goal progressing for improving shortness of breath with ADL's. Dezi is currently able to maintain sats >88% on RA while exercising. She is currently exercising on the recumbant bike and the Nustep. Goal met for developing more efficient breathing techniques such as purse lipped breathing and diaphragmatic breathing;  and practicing self-pacing with activity. She is able to demonstrate purse lip breathing when she gets short of breath. She also knows how to pace herself based on her rate of perceived exertion score. We will continue to monitor Janne's progress throughout the program. Monthly review of patient's Core Components/Risk Factors/Patient Goals are as follows: Goal progressing for improving shortness of breath with ADL's. Leani is currently able to maintain sats >88% on RA while exercising. She is currently exercising on the recumbant bike and the Nustep.  We will continue to monitor Jonathon's progress throughout the program. Azyriah graduated from [place] on 04/17/24. She had good attendance and met all her goals.   Expected Outcomes Brentley will improve her shortness of breath with  ADLs and develop more efficient breathing techniques such as purse lipped breathing and diaphragmatic breathing; and practicing self-pacing with activity Argusta will improve her shortness of breath with ADLs and develop more efficient breathing techniques such as purse lipped breathing and diaphragmatic breathing; and practicing self-pacing with activity To  improve her shortness of breath with ADLs To  improve her shortness of breath with ADLs To continue to exercise and modify her nutrition and lifestyle post graduation            Exercise Goals and Review:  Exercise Goals     Row Name 01/02/24 1339             Exercise Goals   Increase Physical Activity Yes       Intervention Provide advice, education, support and counseling about physical activity/exercise needs.;Develop an individualized exercise prescription for aerobic and resistive training based on initial evaluation findings, risk stratification, comorbidities and participant's personal goals.       Expected Outcomes Short Term: Attend rehab on a regular basis to increase amount of physical activity.;Long Term: Exercising regularly at least 3-5 days a week.;Long Term:  Add in home exercise to make exercise part of routine and to increase amount of physical activity.       Increase Strength and Stamina Yes       Intervention Provide advice, education, support and counseling about physical activity/exercise needs.;Develop an individualized exercise prescription for aerobic and resistive training based on initial evaluation findings, risk stratification, comorbidities and participant's personal goals.       Expected Outcomes Short Term: Increase workloads from initial exercise prescription for resistance, speed, and METs.;Short Term: Perform resistance training exercises routinely during rehab and add in resistance training at home;Long Term: Improve cardiorespiratory fitness, muscular endurance and strength as measured by increased METs and functional capacity ( )       Able to understand and use rate of perceived exertion (RPE) scale Yes       Intervention Provide education and explanation on how to use RPE scale       Expected Outcomes Short Term: Able to use RPE daily in rehab to express subjective intensity level;Long Term:  Able to use RPE to guide intensity level when exercising independently       Able to understand and use Dyspnea scale Yes       Intervention Provide education and explanation on how to use Dyspnea scale       Expected Outcomes Short Term: Able to use Dyspnea scale daily in rehab to express subjective sense of shortness of breath during exertion;Long Term: Able to use Dyspnea scale to guide intensity level when exercising independently       Knowledge and understanding of Target Heart Rate Range (THRR) Yes       Intervention Provide education and explanation of THRR including how the numbers were predicted and where they are located for reference       Expected Outcomes Short Term: Able to state/look up THRR;Short Term: Able to use daily as guideline for intensity in rehab;Long Term: Able to use THRR to govern intensity when exercising  independently       Understanding of Exercise Prescription Yes       Intervention Provide education, explanation, and written materials on patient's individual exercise prescription       Expected Outcomes Short Term: Able to explain program exercise prescription;Long Term: Able to explain home exercise prescription to exercise independently  Exercise Goals Re-Evaluation:  Exercise Goals Re-Evaluation     Row Name 01/03/24 0746 01/30/24 0906 02/22/24 1051 03/23/24 0944       Exercise Goal Re-Evaluation   Exercise Goals Review Increase Physical Activity;Able to understand and use Dyspnea scale;Understanding of Exercise Prescription;Increase Strength and Stamina;Knowledge and understanding of Target Heart Rate Range (THRR);Able to understand and use rate of perceived exertion (RPE) scale Increase Physical Activity;Able to understand and use Dyspnea scale;Understanding of Exercise Prescription;Increase Strength and Stamina;Knowledge and understanding of Target Heart Rate Range (THRR);Able to understand and use rate of perceived exertion (RPE) scale Increase Physical Activity;Able to understand and use Dyspnea scale;Understanding of Exercise Prescription;Increase Strength and Stamina;Knowledge and understanding of Target Heart Rate Range (THRR);Able to understand and use rate of perceived exertion (RPE) scale Increase Physical Activity;Able to understand and use Dyspnea scale;Understanding of Exercise Prescription;Increase Strength and Stamina;Knowledge and understanding of Target Heart Rate Range (THRR);Able to understand and use rate of perceived exertion (RPE) scale    Comments Pt to begin exercise 01/10/24. Will monitor for progressions. Yzabelle has completed 6 exercise sessions. She exercises for 15 min on the recumbent bike and Nustep. Bridgid averages 2.0 METs at level 2 on the recumbent bike and 1.7 METs at level 2 on the Nustep. Noreene has increased her level for both exericse  modes as METs have slightly increased. Will continue to monitor and progress as able. Navy has completed 12 exercise sessions. She exercises for 15 min on the recumbent bike and Nustep. Margaree averages 2.3 METs at level 3 on the recumbent bike and 1.9 METs at level 3 on the Nustep. Tillie has increased her level for both exericse modes. She tolerates progressions well thus far. I have not discussed home exercise with her yet, but I plan to soon. Will continue to monitor and progress as able. Gayle has completed 20 exercise sessions. She exercises for 15 min on the recumbent bike and Nustep. Cartier averages 2.4 METs at level 4 on the recumbent bike and 2.3 METs at level 3 on the Nustep. She performs the warmup and cooldown standing without limitations. Ismelda has increased her level on the recumbent bike but not Nustep. Aslyn has been hesistant to increase her level despite encouragement from staff. Will continue to monitor and progress as able.    Expected Outcomes Through exercise at rehab and home, the patient will decrease shortness of breath with daily activities and feel confident in carrying out an exercise regimen at home Through exercise at rehab and home, the patient will decrease shortness of breath with daily activities and feel confident in carrying out an exercise regimen at home Through exercise at rehab and home, the patient will decrease shortness of breath with daily activities and feel confident in carrying out an exercise regimen at home Through exercise at rehab and home, the patient will decrease shortness of breath with daily activities and feel confident in carrying out an exercise regimen at home             Nutrition & Weight - Outcomes:  Pre Biometrics - 01/02/24 1507       Pre Biometrics   Grip Strength 11 kg              Nutrition:  Nutrition Therapy & Goals - 03/27/24 1143       Nutrition Therapy   Diet Heart Healthy diet      Personal Nutrition Goals    Nutrition Goal Patient to improve diet quality by using the plate  method as a guide for meal planning to include lean protein/plant protein, fruits, vegetables, whole grains, nonfat dairy as part of a well-balanced diet.   goal in progress.   Comments Goals in progress. Milania has medical history of sarcoidosis, CAD, DM2, pulmonary embolism, hyperlipidemia. She continues regular follow-up with cardiology; lipid panel is not at goal. She was awarded financial assistance for Vascepa , Praluent , Nexletol  in January 2025. A1c remains well controlled. She is up 2.2# since starting with our program. Answered patient questions and gave resources for easy, grab and go breakfasts.  Patient was hospitalized on 03/12/24 for hyperkalemia and spironolactone  was stopped; at that time gave education on low potassium intake. Potassium has returned to normal (5/5 labs K+ 4.2); recommended liberalization of potassium intake at that time. Patient will benefit from participation in pulmonary rehab for nutrition, exercise, and lifestyle modification.      Intervention Plan   Intervention Prescribe, educate and counsel regarding individualized specific dietary modifications aiming towards targeted core components such as weight, hypertension, lipid management, diabetes, heart failure and other comorbidities.;Nutrition handout(s) given to patient.    Expected Outcomes Short Term Goal: Understand basic principles of dietary content, such as calories, fat, sodium, cholesterol and nutrients.;Long Term Goal: Adherence to prescribed nutrition plan.             Nutrition Discharge:   Education Questionnaire Score:  Knowledge Questionnaire Score - 04/17/24 0947       Knowledge Questionnaire Score   Post Score 17/18             Goals reviewed with patient; copy given to patient.

## 2024-05-05 DIAGNOSIS — H52223 Regular astigmatism, bilateral: Secondary | ICD-10-CM | POA: Diagnosis not present

## 2024-05-05 DIAGNOSIS — H524 Presbyopia: Secondary | ICD-10-CM | POA: Diagnosis not present

## 2024-05-05 DIAGNOSIS — E119 Type 2 diabetes mellitus without complications: Secondary | ICD-10-CM | POA: Diagnosis not present

## 2024-05-10 ENCOUNTER — Other Ambulatory Visit (HOSPITAL_COMMUNITY): Payer: Self-pay

## 2024-05-10 NOTE — Progress Notes (Signed)
 Paramedicine Encounter    Patient ID: Jocelyn [name], female    DOB: 11/18/1955, 68 y.o.   MRN: [id number]   Complaints-fighting head/sinus cold, sore throat  Edema-none   Compliance with meds-yes  Pill box filled-yes If so, by whom-pt filled it   Refills needed-n/a    Pt reports she is doing ok. She reports she has been fighting  a cold-head/sinus symptoms. No fevers.  But she feels better.  Appetite ok.  Sleeping ok- not sob upon laying flat.  Takes all meds.  She is going to go get labs for her lipid levels   She went to eye doc last week and she had a floater in her eye. Otherwise it was a good visit.   Pt denies c/p, no sob, no dizziness.  No issues or concerns right now.   BP 118/70   Pulse 80   Resp 16   Wt 147 lb (66.7 kg)   SpO2 97%   BMI 26.89 kg/m  Weight yesterday-? Last visit weight-147   Patient Care Team: Benjamine Aland, MD as PCP - General (Family Medicine) Verlin Lonni BIRCH, MD as PCP - Cardiology (Cardiology)  Patient Active Problem List   Diagnosis Date Noted   Physical deconditioning 08/23/2023   Acute respiratory failure with hypoxia (HCC) 08/05/2023   Pulmonary embolism (HCC) 08/03/2023   Pulmonary edema 08/03/2023   Degenerative arthritis of knee 08/20/2021   Hx of pleural effusion    Dyspnea 08/16/2018   Fall on or from sidewalk curb, initial encounter 05/31/2018   History of acute inferior wall MI 08/22/2017   Diabetes mellitus (HCC)    S/P right coronary artery (RCA) stent placement    Overweight 05/05/2017   Diabetes mellitus type 2, controlled, without complications (HCC) 12/16/2016   Asthmatic bronchitis , chronic (HCC) 05/10/2016   Polymyositis (HCC) 09/15/2015   History of sarcoidosis 09/15/2015   Falling 03/13/2015   Edema 02/13/2014   Coronary artery disease involving native heart without angina pectoris 10/29/2013   Hyperlipidemia 10/29/2013   Iron (Fe) deficiency anemia 08/08/2013   Vitamin B 12 deficiency  08/08/2013   GERD (gastroesophageal reflux disease) 09/08/2011   Weakness 02/22/2011   Depression 02/22/2011   MENIERE'S DISEASE 04/30/2009   Vitamin D  deficiency 02/05/2009   Venous (peripheral) insufficiency 12/12/2007   RENAL CALCULUS 12/12/2007   DIZZINESS 12/12/2007   HYPERCHOLESTEROLEMIA 09/16/2007   Anxiety state 09/16/2007   HYPERTENSION, BENIGN 09/16/2007   Allergic rhinitis 09/16/2007   IRRITABLE BOWEL SYNDROME 09/16/2007   Myalgia and myositis 09/16/2007    Current Outpatient Medications:    allopurinol (ZYLOPRIM) 100 MG tablet, Take 1 tablet by mouth daily as needed., Disp: , Rfl:    ALPRAZolam  (XANAX ) 0.5 MG tablet, Take 0.5 mg by mouth 3 (three) times daily., Disp: , Rfl:    apixaban  (ELIQUIS ) 5 MG TABS tablet, Take 10 mg by mouth twice daily, then on 08/17/2023-switch to 5 mg by mouth twice daily., Disp: 180 tablet, Rfl: 3   azaTHIOprine  (IMURAN ) 50 MG tablet, Take 1 tablet by mouth daily., Disp: , Rfl:    Bempedoic Acid  (NEXLETOL ) 180 MG TABS, Take 1 tablet (180 mg total) by mouth daily., Disp: 90 tablet, Rfl: 3   empagliflozin  (JARDIANCE ) 25 MG TABS tablet, Take 1 tablet (25 mg total) by mouth daily before breakfast., Disp: 90 tablet, Rfl: 3   GEMTESA 75 MG TABS, Take 1 tablet by mouth daily., Disp: , Rfl:    icosapent  Ethyl (VASCEPA ) 1 g capsule, TAKE 2 CAPSULES(2 GRAMS)  BY MOUTH TWICE DAILY, Disp: 360 capsule, Rfl: 3   metFORMIN  (GLUCOPHAGE -XR) 500 MG 24 hr tablet, Take 2 tablets (1,000 mg total) by mouth daily with breakfast., Disp: 180 tablet, Rfl: 3   metoprolol  succinate (TOPROL -XL) 100 MG 24 hr tablet, Take 1 tablet (100 mg total) by mouth daily. Take with or immediately following a meal., Disp: 90 tablet, Rfl: 3   ondansetron  (ZOFRAN ) 4 MG tablet, Take 1 tablet (4 mg total) by mouth every 6 (six) hours., Disp: 12 tablet, Rfl: 0   ONETOUCH VERIO test strip, CHECK BLOOD SUGAR TID AS DIRECTED, Disp: , Rfl:    PRALUENT  150 MG/ML SOAJ, ADMINISTER 1 ML UNDER THE SKIN  EVERY 14 DAYS, Disp: 6 mL, Rfl: 3   nitroGLYCERIN  (NITROSTAT ) 0.4 MG SL tablet, Place 1 tablet (0.4 mg total) under the tongue every 5 (five) minutes as needed for chest pain., Disp: 25 tablet, Rfl: 2   pantoprazole  (PROTONIX ) 40 MG tablet, Take 1 tablet (40 mg total) by mouth daily. (Patient not taking: Reported on 05/10/2024), Disp: 30 tablet, Rfl: 0   spironolactone  (ALDACTONE ) 25 MG tablet, TAKE 1 TABLET BY MOUTH EVERY DAY (Patient not taking: Reported on 05/10/2024), Disp: 90 tablet, Rfl: 2 Allergies  Allergen Reactions   Actos [Pioglitazone] Other (See Comments)    REACTION: pt states INTOL w/ edema   Codeine  Other (See Comments)    REACTION: vomiting   Januvia [Sitagliptin] Nausea Only   Lactose Intolerance (Gi) Diarrhea   Morphine Nausea Only and Nausea And Vomiting    REACTION: vomiting REACTION: vomiting   Repatha  [Evolocumab ]     WAS NOT EFFECTIVE IN LOWERING LDL      Social History   Socioeconomic History   Marital status: Single    Spouse name: Not on file   Number of children: 1   Years of education: Not on file   Highest education level: Master's degree (e.g., MA, MS, MEng, MEd, MSW, MBA)  Occupational History   Occupation: Retired Runner, broadcasting/film/video   Tobacco Use   Smoking status: Never   Smokeless tobacco: Never   Tobacco comments:    Daily Caffeine - 1  Exercise 2-3 times/weekly  Vaping Use   Vaping status: Never Used  Substance and Sexual Activity   Alcohol use: No    Alcohol/week: 0.0 standard drinks of alcohol   Drug use: No   Sexual activity: Yes    Birth control/protection: None  Other Topics Concern   Not on file  Social History Narrative   Exercises 2-3 times weekly   Caffeine Use: 1 daily (tea or Pepsi)   Lives alone in a one story home.  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  Retired Midwife.     Social Drivers of Corporate investment banker Strain: Low Risk  (08/05/2023)   Overall Financial Resource Strain (CARDIA)    Difficulty of Paying Living Expenses:  Not hard at all  Food Insecurity: No Food Insecurity (08/05/2023)   Hunger Vital Sign    Worried About Running Out of Food in the Last Year: Never true    Ran Out of Food in the Last Year: Never true  Transportation Needs: No Transportation Needs (08/05/2023)   PRAPARE - Administrator, Civil Service (Medical): No    Lack of Transportation (Non-Medical): No  Physical Activity: Not on file  Stress: No Stress Concern Present (08/05/2023)   [employer] of Occupational Health - Occupational Stress Questionnaire    Feeling of Stress : Not at all  Social Connections: Not on file  Intimate Partner Violence: Not At Risk (08/05/2023)   Humiliation, Afraid, Rape, and Kick questionnaire    Fear of Current or Ex-Partner: No    Emotionally Abused: No    Physically Abused: No    Sexually Abused: No    Physical Exam      Future Appointments  Date Time Provider Department Center  06/06/2024  2:00 PM [facility] PA/NP [facility] None  07/30/2024  1:45 PM Gregg Lek, MD [facility] None       [name], Paramedic [phone] [facility] Paramedic  05/10/24

## 2024-05-12 ENCOUNTER — Encounter (HOSPITAL_COMMUNITY): Payer: Self-pay | Admitting: Interventional Radiology

## 2024-05-12 DIAGNOSIS — I2699 Other pulmonary embolism without acute cor pulmonale: Secondary | ICD-10-CM | POA: Diagnosis not present

## 2024-06-02 ENCOUNTER — Other Ambulatory Visit: Payer: Self-pay | Admitting: Interventional Cardiology

## 2024-06-05 ENCOUNTER — Telehealth: Payer: Self-pay | Admitting: Pharmacy Technician

## 2024-06-05 ENCOUNTER — Telehealth (HOSPITAL_COMMUNITY): Payer: Self-pay

## 2024-06-05 ENCOUNTER — Other Ambulatory Visit (HOSPITAL_COMMUNITY): Payer: Self-pay

## 2024-06-05 NOTE — Telephone Encounter (Signed)
 Received this  Had to leave another message and explained its vascepa  not this

## 2024-06-05 NOTE — Telephone Encounter (Signed)
 Called and spoke to Pt's friend to confirm/remind patient of their appointment at the [facility] on 7/23\/25.   Appointment:   [x] Confirmed  [] Left mess   [] No answer/No voice mail  [] VM Full/unable to leave message  [] Phone not in service  Patient reminded to bring all medications and/or complete list.  Confirmed patient has transportation. Gave directions, instructed to utilize valet parking.

## 2024-06-05 NOTE — Telephone Encounter (Signed)
 Received notice that icosapent  is not covered. Per test claim brand vascepa  is covered at free copay. I called [facility] and had to [facility] a message for [facility] to [facility] brand

## 2024-06-06 ENCOUNTER — Encounter (HOSPITAL_COMMUNITY): Payer: Self-pay

## 2024-06-06 ENCOUNTER — Other Ambulatory Visit (HOSPITAL_COMMUNITY): Payer: Self-pay

## 2024-06-06 ENCOUNTER — Ambulatory Visit (HOSPITAL_COMMUNITY)
Admission: RE | Admit: 2024-06-06 | Discharge: 2024-06-06 | Disposition: A | Discharge status: Home / Self Care | Payer: Medicare PPO | Source: Ambulatory Visit | Attending: Family Medicine | Admitting: Family Medicine

## 2024-06-06 ENCOUNTER — Telehealth: Payer: Self-pay | Admitting: Pharmacy Technician

## 2024-06-06 ENCOUNTER — Ambulatory Visit (HOSPITAL_COMMUNITY): Payer: Self-pay | Admitting: Family Medicine

## 2024-06-06 VITALS — BP 140/78 | HR 65 | Ht 62.0 in | Wt 151.0 lb

## 2024-06-06 DIAGNOSIS — I50810 Right heart failure, unspecified: Secondary | ICD-10-CM

## 2024-06-06 DIAGNOSIS — Z8674 Personal history of sudden cardiac arrest: Secondary | ICD-10-CM | POA: Insufficient documentation

## 2024-06-06 DIAGNOSIS — Z79899 Other long term (current) drug therapy: Secondary | ICD-10-CM | POA: Insufficient documentation

## 2024-06-06 DIAGNOSIS — M25569 Pain in unspecified knee: Secondary | ICD-10-CM | POA: Insufficient documentation

## 2024-06-06 DIAGNOSIS — I2699 Other pulmonary embolism without acute cor pulmonale: Secondary | ICD-10-CM | POA: Diagnosis not present

## 2024-06-06 DIAGNOSIS — Z955 Presence of coronary angioplasty implant and graft: Secondary | ICD-10-CM | POA: Diagnosis not present

## 2024-06-06 DIAGNOSIS — I2510 Atherosclerotic heart disease of native coronary artery without angina pectoris: Secondary | ICD-10-CM | POA: Insufficient documentation

## 2024-06-06 DIAGNOSIS — Z7901 Long term (current) use of anticoagulants: Secondary | ICD-10-CM | POA: Insufficient documentation

## 2024-06-06 DIAGNOSIS — I110 Hypertensive heart disease with heart failure: Secondary | ICD-10-CM | POA: Diagnosis not present

## 2024-06-06 DIAGNOSIS — I252 Old myocardial infarction: Secondary | ICD-10-CM | POA: Insufficient documentation

## 2024-06-06 DIAGNOSIS — I10 Essential (primary) hypertension: Secondary | ICD-10-CM | POA: Diagnosis not present

## 2024-06-06 DIAGNOSIS — Z86711 Personal history of pulmonary embolism: Secondary | ICD-10-CM | POA: Insufficient documentation

## 2024-06-06 DIAGNOSIS — D509 Iron deficiency anemia, unspecified: Secondary | ICD-10-CM

## 2024-06-06 LAB — IRON AND TIBC
Iron: 55 ug/dL (ref 28–170)
Saturation Ratios: 12 % (ref 10.4–31.8)
TIBC: 452 ug/dL — ABNORMAL HIGH (ref 250–450)
UIBC: 397 ug/dL

## 2024-06-06 LAB — CBC
HCT: 40.4 % (ref 36.0–46.0)
Hemoglobin: 12.9 g/dL (ref 12.0–15.0)
MCH: 33.3 pg (ref 26.0–34.0)
MCHC: 31.9 g/dL (ref 30.0–36.0)
MCV: 104.4 fL — ABNORMAL HIGH (ref 80.0–100.0)
Platelets: 255 K/uL (ref 150–400)
RBC: 3.87 MIL/uL (ref 3.87–5.11)
RDW: 13 % (ref 11.5–15.5)
WBC: 4.1 K/uL (ref 4.0–10.5)
nRBC: 0 % (ref 0.0–0.2)

## 2024-06-06 LAB — LIPID PANEL
Cholesterol: 133 mg/dL (ref 0–200)
HDL: 35 mg/dL — ABNORMAL LOW (ref >40)
LDL Cholesterol: 53 mg/dL (ref 0–99)
Total CHOL/HDL Ratio: 3.8 ratio
Triglycerides: 224 mg/dL — ABNORMAL HIGH (ref <150)
VLDL: 45 mg/dL — ABNORMAL HIGH (ref 0–40)

## 2024-06-06 LAB — FERRITIN: Ferritin: 27 ng/mL (ref 11–307)

## 2024-06-06 LAB — BASIC METABOLIC PANEL WITH GFR
Anion gap: 10 (ref 5–15)
BUN: 29 mg/dL — ABNORMAL HIGH (ref 8–23)
CO2: 19 mmol/L — ABNORMAL LOW (ref 22–32)
Calcium: 9.8 mg/dL (ref 8.9–10.3)
Chloride: 106 mmol/L (ref 98–111)
Creatinine, Ser: 1.4 mg/dL — ABNORMAL HIGH (ref 0.44–1.00)
GFR, Estimated: 41 mL/min — ABNORMAL LOW (ref >60)
Glucose, Bld: 95 mg/dL (ref 70–99)
Potassium: 4.9 mmol/L (ref 3.5–5.1)
Sodium: 135 mmol/L (ref 135–145)

## 2024-06-06 LAB — BRAIN NATRIURETIC PEPTIDE: B Natriuretic Peptide: 18.5 pg/mL (ref 0.0–100.0)

## 2024-06-06 NOTE — Patient Instructions (Addendum)
 Good to see you today!  Labs done today, your results will be available in MyChart, we will contact you for abnormal readings.  Your physician has requested that you have an echocardiogram. Echocardiography is a painless test that uses sound waves to create images of your heart. It provides your doctor with information about the size and shape of your heart and how well your heart's chambers and valves are working. This procedure takes approximately one hour. There are no restrictions for this procedure. Please do NOT wear cologne, perfume, aftershave, or lotions (deodorant is allowed). Please arrive 15 minutes prior to your appointment time.  Please note: We ask at that you not bring children with you during ultrasound (echo/ vascular) testing. Due to room size and safety concerns, children are not allowed in the ultrasound rooms during exams. Our front office staff cannot provide observation of children in our lobby area while testing is being conducted. An adult accompanying a patient to their appointment will only be allowed in the ultrasound room at the discretion of the ultrasound technician under special circumstances. We apologize for any inconvenience.  You have been referred to hemology they will call to schedule an appointment  Your physician recommends that you schedule a follow-up appointment 6 months with echocardiogram(January) Call office in November to schedule an appointment  If you have any questions or concerns before your next appointment please send us  a message through [facility] or call our office at [phone].    TO LEAVE A MESSAGE FOR THE NURSE SELECT OPTION 2, PLEASE LEAVE A MESSAGE INCLUDING: YOUR NAME DATE OF BIRTH CALL BACK NUMBER REASON FOR CALL**this is important as we prioritize the call backs  YOU WILL RECEIVE A CALL BACK THE SAME DAY AS LONG AS YOU CALL BEFORE 4:00 PM At the [facility], you and your health needs are our priority. As part of  our continuing mission to provide you with exceptional heart care, we have created designated Provider Care Teams. These Care Teams include your primary Cardiologist (physician) and Advanced Practice Providers (APPs- Physician Assistants and Nurse Practitioners) who all work together to provide you with the care you need, when you need it.   You may see any of the following providers on your designated Care Team at your next follow up: Dr Toribio Fuel Dr Ezra Shuck Dr. Ria Commander Dr. Morene Brownie Amy Lenetta, NP Caffie Shed, [place] [facility] [place], [place] Beckey Coe, NP [place] Lee, NP Ellouise Class, NP Tinnie Redman, PharmD Jaun Bash, PharmD   Please be sure to bring in all your medications bottles to every appointment.    Thank you for choosing [facility]

## 2024-06-06 NOTE — Progress Notes (Signed)
 Paramedicine Encounter   Patient ID: Jocelyn [name] , female,   DOB: [birth year],67 y.o.,  MRN: [id number]   Met patient in clinic today with provider.  Weight @ clinic-151 B/P-152/90 P-62 [id number]   Has not had meds yet today.  She has noticed more fatigue been going on for the past week. She just got back from vacation last week.  She did a lot of walking during that visit.  Denies dizziness, she does get sob if walking too much or doing too much.  Some chest tightness at times.   Lipid panel done today and will be sent to the lipid clinic.  CBC and BMET   Back in 6 mths with ECHO dr rolan.  She is going to get the number back to hematologist to call to get back in with them.  She also needs to call back to kidney doc to get in with them.    Med coby [name], EMT-Paramedic [phone] 06/06/2024

## 2024-06-06 NOTE — Telephone Encounter (Signed)
   I called and spoke to the pharmacist they are ordering vascepa  and she doesn't know why we are getting these. She will make a note needs to be vascepa .

## 2024-06-06 NOTE — Progress Notes (Signed)
 [facility]   Primary Care: Benjamine Aland, MD HF Cardiologist: Dr. Rolan  HPI: 68 y.o. female w/ h/o inferior STEMI on 06/15/2017. She received a drug-eluting stent to her distal right coronary artery. She had mild LAD disease. Echo at that time showed normal LVEF 50-55%, RV normal.  She additionally has history of polymyositis (followed by Dr. Mai), sarcoidosis diagnosed by bronchoscopy in the 1990s (in remission), and asymptomatic meningioma.   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    Admitted 9/24 with PE. CT angio showed saddle pulmonary embolism as well as extensive bilateral emboli. She developed PEA arrest/obstructive shock. Achieved ROSC after ~5 min of CPR, Epi x 1 and TNK. Intubated, started on NE. Initial bedside echo showed LVEF ~30-35%, dilated RV w/ paradoxical septal motion and RV hypokinesis. Underwent aspiration thrombectomy of right and left main and lobar pulmonary arteries by IR. Repeat formal echo showed improved LVEF, 60-65%, + RV McConnell's sign. RV dilated mildly reduced systolic fx. Required milrinone  gtt. Drips eventually weaned.  Limited echo prior to discharge showed EF 60-65%, RV mildly dilated with mild HK. Will require lifelong AC. She was discharged home, weight 173 lbs. She was negative for antiphospholipid antibody syndrome, Factor V Leiden, and prothrombin gene mutation.   Repeat echo was done in 12/24, showing EF 60-65%, mild LVH, borderline RV dysfunction with hypokinesis at apex, no TR jet to estimate PA systolic pressure, IVC normal.   Today she returns for HF follow up with [name] with paramedicine. Overall feeling fine. She has occasional chest tightness. She is not SOB walking on flat ground. Denies palpitations, abnormal bleeding, dizziness, edema, or PND/Orthopnea. Appetite ok. Weight at home 147-149 pounds. Taking all medications. Followed by paramedicine.  ECG (personally reviewed): none ordered  today  Labs (9/24): K 3.7, creatinine 1.13 Labs (1/25): LDL 141 Labs (4/25): K 4.2, creatinine 1.31  PMH: 1. Polymyositis: On azathioprine  2. Sarcoidosis: Diagnosed by bronchoscopy in 1980s.  Now in remission.  3. CAD: Inferior STEMI in 8/18, DES to distal RCA.  - Cardiolite in 9/24 with no ischemia/infarction.  4. Meningioma: 6 mm meningioma noted on CT in 9/24, followed by neurology.  5. H/o Meniere's disease 6. Nephrolithiasis 7. Hyperlipidemia 8. Type 2 diabetes 9. Pulmonary embolus: Large saddle embolus in 9/24, no definite trigger.  Treated with mechanical thrombectomy.  Initial RV failure. Negative lupus anticoagulant, anticardiolipin antibody, Factor V Leiden, and prothrombin gene mutation.  - Echo (08/04/23): EF 60-65%, mild LVH, RVOT dilated and mildly reduced, + McConnell's sign. - Ltd echo (08/09/23): EF 60-65%, RV mildly dilated with mild HK. - Echo (12/24): EF 60-65%, mild LVH, borderline RV dysfunction with hypokinesis at apex, no TR jet to estimate PA systolic pressure, IVC normal.   Review of Systems: All systems reviewed and negative except as per HPI.   Current Outpatient Medications  Medication Sig Dispense Refill   ALPRAZolam  (XANAX ) 0.5 MG tablet Take 0.5 mg by mouth 3 (three) times daily.     apixaban  (ELIQUIS ) 5 MG TABS tablet Take 10 mg by mouth twice daily, then on 08/17/2023-switch to 5 mg by mouth twice daily. 180 tablet 3   azaTHIOprine  (IMURAN ) 50 MG tablet Take 1 tablet by mouth daily.     Bempedoic Acid  (NEXLETOL ) 180 MG TABS Take 1 tablet (180 mg total) by mouth daily. 90 tablet 3   GEMTESA 75 MG TABS Take 1 tablet by mouth daily.     icosapent  Ethyl (VASCEPA ) 1 g  capsule TAKE 2 CAPSULES(2 GRAMS) BY MOUTH TWICE DAILY 360 capsule 2   metoprolol  succinate (TOPROL -XL) 100 MG 24 hr tablet Take 1 tablet (100 mg total) by mouth daily. Take with or immediately following a meal. 90 tablet 3   nitroGLYCERIN  (NITROSTAT ) 0.4 MG SL tablet Place 1 tablet (0.4 mg  total) under the tongue every 5 (five) minutes as needed for chest pain. 25 tablet 2   ONETOUCH VERIO test strip CHECK BLOOD SUGAR TID AS DIRECTED     PRALUENT  150 MG/ML SOAJ ADMINISTER 1 ML UNDER THE SKIN EVERY 14 DAYS 6 mL 3   allopurinol (ZYLOPRIM) 100 MG tablet Take 1 tablet by mouth daily as needed. (Patient not taking: Reported on 06/06/2024)     empagliflozin  (JARDIANCE ) 25 MG TABS tablet Take 1 tablet (25 mg total) by mouth daily before breakfast. (Patient not taking: Reported on 06/06/2024) 90 tablet 3   metFORMIN  (GLUCOPHAGE -XR) 500 MG 24 hr tablet Take 2 tablets (1,000 mg total) by mouth daily with breakfast. (Patient not taking: Reported on 06/06/2024) 180 tablet 3   ondansetron  (ZOFRAN ) 4 MG tablet Take 1 tablet (4 mg total) by mouth every 6 (six) hours. (Patient not taking: Reported on 06/06/2024) 12 tablet 0   pantoprazole  (PROTONIX ) 40 MG tablet Take 1 tablet (40 mg total) by mouth daily. (Patient not taking: Reported on 06/06/2024) 30 tablet 0   spironolactone  (ALDACTONE ) 25 MG tablet TAKE 1 TABLET BY MOUTH EVERY DAY (Patient not taking: Reported on 06/06/2024) 90 tablet 2   No current facility-administered medications for this encounter.   Allergies  Allergen Reactions   Actos [Pioglitazone] Other (See Comments)    REACTION: pt states INTOL w/ edema   Codeine  Other (See Comments)    REACTION: vomiting   Januvia [Sitagliptin] Nausea Only   Lactose Intolerance (Gi) Diarrhea   Morphine Nausea Only and Nausea And Vomiting    REACTION: vomiting REACTION: vomiting   Repatha  [Evolocumab ]     WAS NOT EFFECTIVE IN LOWERING LDL   Social History   Socioeconomic History   Marital status: Single    Spouse name: Not on file   Number of children: 1   Years of education: Not on file   Highest education level: Master's degree (e.g., MA, MS, MEng, MEd, MSW, MBA)  Occupational History   Occupation: Retired Runner, broadcasting/film/video   Tobacco Use   Smoking status: Never   Smokeless tobacco: Never    Tobacco comments:    Daily Caffeine - 1  Exercise 2-3 times/weekly  Vaping Use   Vaping status: Never Used  Substance and Sexual Activity   Alcohol use: No    Alcohol/week: 0.0 standard drinks of alcohol   Drug use: No   Sexual activity: Yes    Birth control/protection: None  Other Topics Concern   Not on file  Social History Narrative   Exercises 2-3 times weekly   Caffeine Use: 1 daily (tea or Pepsi)   Lives alone in a one story home.  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  Retired Midwife.     Social Drivers of Corporate investment banker Strain: Low Risk  (08/05/2023)   Overall Financial Resource Strain (CARDIA)    Difficulty of Paying Living Expenses: Not hard at all  Food Insecurity: No Food Insecurity (08/05/2023)   Hunger Vital Sign    Worried About Running Out of Food in the Last Year: Never true    Ran Out of Food in the Last Year: Never true  Transportation Needs: No Transportation  Needs (08/05/2023)   PRAPARE - Administrator, Civil Service (Medical): No    Lack of Transportation (Non-Medical): No  Physical Activity: Not on file  Stress: No Stress Concern Present (08/05/2023)   [employer] of Occupational Health - Occupational Stress Questionnaire    Feeling of Stress : Not at all  Social Connections: Not on file  Intimate Partner Violence: Not At Risk (08/05/2023)   Humiliation, Afraid, Rape, and Kick questionnaire    Fear of Current or Ex-Partner: No    Emotionally Abused: No    Physically Abused: No    Sexually Abused: No   Family History  Adopted: Yes  Problem Relation Age of Onset   Other Other        ADOPTED   Colon cancer Neg Hx    Esophageal cancer Neg Hx    Rectal cancer Neg Hx    Stomach cancer Neg Hx    Wt Readings from Last 3 Encounters:  06/06/24 68.5 kg (151 lb)  05/10/24 66.7 kg (147 lb)  04/18/24 66.7 kg (147 lb)   BP (!) 152/90   Pulse 68   Ht 5' 2 (1.575 m)   Wt 68.5 kg (151 lb)   SpO2 97%   BMI 27.62 kg/m   PHYSICAL  EXAM: General:  NAD. No resp difficulty, walked into clinic HEENT: Normal Neck: Supple. No JVD. Cor: Regular rate & rhythm. No rubs, gallops or murmurs. Lungs: Clear Abdomen: Soft, nontender, nondistended.  Extremities: No cyanosis, clubbing, rash, edema Neuro: Alert & oriented x 3, moves all 4 extremities w/o difficulty. Affect pleasant.  ASSESSMENT & PLAN: 1. PE: Large, hemodynamically significant saddle embolus in 9/24. Had PEA arrest. Had thrombolytics then mechanical aspiration by IR. Trigger for PE uncertain, ambulation limited by knee pain but not sedentary.  No long trips, no known cancer, etc.  Does not know family history (adopted). Hypercoagulable work up negative--> Factor V Leiden, prothrombin gene mutation, and lupus anticoagulant panel. - Continue Eliquis  indefinitely. CBC & iron studies today. - Has appointment with hematology.  - She completed pulmonary rehab.  2. Chronic RV failure: Shock in 9/24 from large saddle embolus.  S/p thrombectomy and started on pressors/inotropes for RV shock. Now seems resolved.  Echo in 12/24 showed EF 60-65%, mild LVH, borderline RV dysfunction with hypokinesis at apex, no TR jet to estimate PA systolic pressure, IVC normal. Volume stable today. - She has not needed Lasix .  - Off spiro with hyperkalemia. - Continue SGLT2i (takes Synjardy 1000/25) - Check BNP.  - Update echo next visit 3. CAD: H/o inferior MI in 8/18 with DES to RCA.  Cardiolite in 9/24 showed no ischemia/infarction. No chest pain. - Continue Praluent , Nexletol , Vascepa . Followed by Lipid Clinic.  Check lipids today. - No ASA given Eliquis  use.  4. HTN: BP elevated, but typically sBP 110's at home. - No change in meds for now.   Follow up in 6 months with Dr. Rolan + echo.  Harlene HERO [facility] FNP-BC 06/06/2024

## 2024-06-07 ENCOUNTER — Other Ambulatory Visit (HOSPITAL_COMMUNITY): Payer: Self-pay

## 2024-06-07 DIAGNOSIS — I50810 Right heart failure, unspecified: Secondary | ICD-10-CM

## 2024-06-07 NOTE — Telephone Encounter (Addendum)
 Pt aware, agreeable, and verbalized understanding  She is agreeable to infusion. Orders in and Prior Auth sent to [facility]   ----- Message from Harlene CHRISTELLA Gainer sent at 06/06/2024  4:56 PM EDT ----- Tsat and ferritin are low. May be contributing to her symptoms. Please arrange iron infusion if she is agreeable.  LDL much improved. I will forward labs to Lipid Clinic ----- Message ----- From: Interface, Lab In [place] Sent: 06/06/2024   3:37 PM EDT To: Harlene CHRISTELLA Gainer, FNP

## 2024-06-11 DIAGNOSIS — I2699 Other pulmonary embolism without acute cor pulmonale: Secondary | ICD-10-CM | POA: Diagnosis not present

## 2024-06-13 DIAGNOSIS — E669 Obesity, unspecified: Secondary | ICD-10-CM | POA: Diagnosis not present

## 2024-06-13 DIAGNOSIS — E1151 Type 2 diabetes mellitus with diabetic peripheral angiopathy without gangrene: Secondary | ICD-10-CM | POA: Diagnosis not present

## 2024-06-13 DIAGNOSIS — I2510 Atherosclerotic heart disease of native coronary artery without angina pectoris: Secondary | ICD-10-CM | POA: Diagnosis not present

## 2024-06-13 DIAGNOSIS — D3502 Benign neoplasm of left adrenal gland: Secondary | ICD-10-CM | POA: Diagnosis not present

## 2024-06-19 DIAGNOSIS — E1122 Type 2 diabetes mellitus with diabetic chronic kidney disease: Secondary | ICD-10-CM | POA: Diagnosis not present

## 2024-06-19 DIAGNOSIS — E785 Hyperlipidemia, unspecified: Secondary | ICD-10-CM | POA: Diagnosis not present

## 2024-06-19 DIAGNOSIS — G4733 Obstructive sleep apnea (adult) (pediatric): Secondary | ICD-10-CM | POA: Diagnosis not present

## 2024-06-19 DIAGNOSIS — I2510 Atherosclerotic heart disease of native coronary artery without angina pectoris: Secondary | ICD-10-CM | POA: Diagnosis not present

## 2024-06-19 DIAGNOSIS — N1832 Chronic kidney disease, stage 3b: Secondary | ICD-10-CM | POA: Diagnosis not present

## 2024-06-19 DIAGNOSIS — N200 Calculus of kidney: Secondary | ICD-10-CM | POA: Diagnosis not present

## 2024-06-19 DIAGNOSIS — I129 Hypertensive chronic kidney disease with stage 1 through stage 4 chronic kidney disease, or unspecified chronic kidney disease: Secondary | ICD-10-CM | POA: Diagnosis not present

## 2024-06-27 ENCOUNTER — Other Ambulatory Visit (HOSPITAL_COMMUNITY): Payer: Self-pay

## 2024-06-27 NOTE — Progress Notes (Signed)
 Paramedicine Encounter    Patient ID: Jocelyn [name], female    DOB: Jul 21, 1956, 68 y.o.   MRN: [id number]   Complaints-none   Edema-none   Compliance with meds-yes  Pill box filled-yes  If so, by whom-pt filled herself   Refills needed-n/a   Pt reports she is doing ok. She has went to [place] is to go back in . She needs to sch PCP and rheum doc.  She has appoint with hematologist this month.  She denies increased sob, she does get sob if she over exerts herself. She denies dizziness. She reports chest tightness in her chest-she reported this was a few days ago at rest. She did not take ntg at this time. She reports center of chest tightness, no radiation of pain. No diaphoresis or sob with it. No n/v during this time, that lasted about an hour. I advised her to please call 911 if that happens again. She denies any spicy foods prior to this, but she does have hx of reflux. Told her to not pass off these symptoms like its nothing.   She is due for  ECHO in dec, will have to call in couple months to sch.  she has been doing ok, would prefer to keep her onboard for a few more months to see how her next echo goes and to see how these next speciality appoints go and go from there.  No issues with meds.  Taking her meds.  Meds verified.   Will f/u in 2 wks around 3-4.    BP 132/84   Pulse 70   Resp 16   SpO2 97%  Weight yesterday-148 on Sunday  Last visit weight-151 @ clinic   Patient Care Team: Benjamine Aland, MD as PCP - General (Family Medicine) Verlin Lonni BIRCH, MD as PCP - Cardiology (Cardiology)  Patient Active Problem List   Diagnosis Date Noted   Physical deconditioning 08/23/2023   Acute respiratory failure with hypoxia (HCC) 08/05/2023   Pulmonary embolism (HCC) 08/03/2023   Pulmonary edema 08/03/2023   Degenerative arthritis of knee 08/20/2021   Hx of pleural effusion    Dyspnea 08/16/2018   Fall on or from sidewalk curb, initial encounter  05/31/2018   History of acute inferior wall MI 08/22/2017   Diabetes mellitus (HCC)    S/P right coronary artery (RCA) stent placement    Overweight 05/05/2017   Diabetes mellitus type 2, controlled, without complications (HCC) 12/16/2016   Asthmatic bronchitis , chronic (HCC) 05/10/2016   Polymyositis (HCC) 09/15/2015   History of sarcoidosis 09/15/2015   Falling 03/13/2015   Edema 02/13/2014   Coronary artery disease involving native heart without angina pectoris 10/29/2013   Hyperlipidemia 10/29/2013   Iron (Fe) deficiency anemia 08/08/2013   Vitamin B 12 deficiency 08/08/2013   GERD (gastroesophageal reflux disease) 09/08/2011   Weakness 02/22/2011   Depression 02/22/2011   MENIERE'S DISEASE 04/30/2009   Vitamin D  deficiency 02/05/2009   Venous (peripheral) insufficiency 12/12/2007   RENAL CALCULUS 12/12/2007   DIZZINESS 12/12/2007   HYPERCHOLESTEROLEMIA 09/16/2007   Anxiety state 09/16/2007   HYPERTENSION, BENIGN 09/16/2007   Allergic rhinitis 09/16/2007   IRRITABLE BOWEL SYNDROME 09/16/2007   Myalgia and myositis 09/16/2007    Current Outpatient Medications:    ALPRAZolam  (XANAX ) 0.5 MG tablet, Take 0.5 mg by mouth 3 (three) times daily., Disp: , Rfl:    apixaban  (ELIQUIS ) 5 MG TABS tablet, Take 10 mg by mouth twice daily, then on 08/17/2023-switch to 5 mg by mouth  twice daily., Disp: 180 tablet, Rfl: 3   azaTHIOprine  (IMURAN ) 50 MG tablet, Take 1 tablet by mouth daily., Disp: , Rfl:    Bempedoic Acid  (NEXLETOL ) 180 MG TABS, Take 1 tablet (180 mg total) by mouth daily., Disp: 90 tablet, Rfl: 3   Empagliflozin -metFORMIN  HCl ER (SYNJARDY XR) 25-1000 MG TB24, Take 25-1,000 tablets by mouth daily., Disp: , Rfl:    GEMTESA 75 MG TABS, Take 1 tablet by mouth daily., Disp: , Rfl:    icosapent  Ethyl (VASCEPA ) 1 g capsule, TAKE 2 CAPSULES(2 GRAMS) BY MOUTH TWICE DAILY, Disp: 360 capsule, Rfl: 2   metoprolol  succinate (TOPROL -XL) 100 MG 24 hr tablet, Take 1 tablet (100 mg total) by  mouth daily. Take with or immediately following a meal., Disp: 90 tablet, Rfl: 3   nitroGLYCERIN  (NITROSTAT ) 0.4 MG SL tablet, Place 1 tablet (0.4 mg total) under the tongue every 5 (five) minutes as needed for chest pain., Disp: 25 tablet, Rfl: 2   ONETOUCH VERIO test strip, CHECK BLOOD SUGAR TID AS DIRECTED, Disp: , Rfl:    PRALUENT  150 MG/ML SOAJ, ADMINISTER 1 ML UNDER THE SKIN EVERY 14 DAYS, Disp: 6 mL, Rfl: 3   allopurinol (ZYLOPRIM) 100 MG tablet, Take 1 tablet by mouth daily as needed. (Patient not taking: Reported on 06/27/2024), Disp: , Rfl:    empagliflozin  (JARDIANCE ) 25 MG TABS tablet, Take 1 tablet (25 mg total) by mouth daily before breakfast. (Patient not taking: Reported on 06/27/2024), Disp: 90 tablet, Rfl: 3   metFORMIN  (GLUCOPHAGE -XR) 500 MG 24 hr tablet, Take 2 tablets (1,000 mg total) by mouth daily with breakfast. (Patient not taking: Reported on 06/27/2024), Disp: 180 tablet, Rfl: 3   ondansetron  (ZOFRAN ) 4 MG tablet, Take 1 tablet (4 mg total) by mouth every 6 (six) hours. (Patient not taking: Reported on 06/27/2024), Disp: 12 tablet, Rfl: 0   pantoprazole  (PROTONIX ) 40 MG tablet, Take 1 tablet (40 mg total) by mouth daily. (Patient not taking: Reported on 06/27/2024), Disp: 30 tablet, Rfl: 0   spironolactone  (ALDACTONE ) 25 MG tablet, TAKE 1 TABLET BY MOUTH EVERY DAY (Patient not taking: Reported on 06/27/2024), Disp: 90 tablet, Rfl: 2 Allergies  Allergen Reactions   Actos [Pioglitazone] Other (See Comments)    REACTION: pt states INTOL w/ edema   Codeine  Other (See Comments)    REACTION: vomiting   Januvia [Sitagliptin] Nausea Only   Lactose Intolerance (Gi) Diarrhea   Morphine Nausea Only and Nausea And Vomiting    REACTION: vomiting REACTION: vomiting   Repatha  [Evolocumab ]     WAS NOT EFFECTIVE IN LOWERING LDL      Social History   Socioeconomic History   Marital status: Single    Spouse name: Not on file   Number of children: 1   Years of education: Not on file    Highest education level: Master's degree (e.g., MA, MS, MEng, MEd, MSW, MBA)  Occupational History   Occupation: Retired Runner, broadcasting/film/video   Tobacco Use   Smoking status: Never   Smokeless tobacco: Never   Tobacco comments:    Daily Caffeine - 1  Exercise 2-3 times/weekly  Vaping Use   Vaping status: Never Used  Substance and Sexual Activity   Alcohol use: No    Alcohol/week: 0.0 standard drinks of alcohol   Drug use: No   Sexual activity: Yes    Birth control/protection: None  Other Topics Concern   Not on file  Social History Narrative   Exercises 2-3 times weekly   Caffeine Use:  1 daily (tea or Pepsi)   Lives alone in a one story home.  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  Retired Midwife.     Social Drivers of Corporate investment banker Strain: Low Risk  (08/05/2023)   Overall Financial Resource Strain (CARDIA)    Difficulty of Paying Living Expenses: Not hard at all  Food Insecurity: No Food Insecurity (08/05/2023)   Hunger Vital Sign    Worried About Running Out of Food in the Last Year: Never true    Ran Out of Food in the Last Year: Never true  Transportation Needs: No Transportation Needs (08/05/2023)   PRAPARE - Administrator, Civil Service (Medical): No    Lack of Transportation (Non-Medical): No  Physical Activity: Not on file  Stress: No Stress Concern Present (08/05/2023)   [employer] of Occupational Health - Occupational Stress Questionnaire    Feeling of Stress : Not at all  Social Connections: Not on file  Intimate Partner Violence: Not At Risk (08/05/2023)   Humiliation, Afraid, Rape, and Kick questionnaire    Fear of Current or Ex-Partner: No    Emotionally Abused: No    Physically Abused: No    Sexually Abused: No    Physical Exam      Future Appointments  Date Time Provider Department Center  07/09/2024  2:00 PM Autumn Millman, MD [facility] None  07/09/2024  2:30 PM [facility] PHLEBOTOMIST [facility] None  07/30/2024  1:45 PM Gregg Lek, MD [facility] None  09/18/2024  2:30 PM Geronimo Amel, MD [facility] None       [name], Paramedic [phone] [facility] Paramedic  06/27/24

## 2024-07-02 ENCOUNTER — Telehealth: Payer: Self-pay

## 2024-07-02 NOTE — Addendum Note (Signed)
 Addended by: BUELL POWELL HERO on: 07/02/2024 01:01 PM   Modules accepted: Orders

## 2024-07-02 NOTE — Telephone Encounter (Signed)
 Patient referred to infusion pharmacy team for ambulatory infusion of IV iron.  Insurance - Technical sales engineer of care - Site of care: [facility] Dx code - D50.9 IV Iron Therapy - Venofer 300 mg x 3 Infusion appointments - Scheduling team will schedule patient as soon as possible.    Thank you,  Norton Blush, PharmD Pharmacist [facility]

## 2024-07-02 NOTE — Telephone Encounter (Signed)
 Jocelyn [name], patient will be scheduled as soon as possible.  Auth Submission: NO AUTH NEEDED Site of care: Site of care: [facility] Payer: [employer] medicare Medication & CPT/J Code(s) submitted: Venofer (Iron Sucrose) J1756 Diagnosis Code:  Route of submission (phone, fax, portal):  Phone # Fax # Auth type: Buy/Bill PB Units/visits requested: 300mg  x 3 doses Reference number:  Approval from: 07/02/24 to 11/01/24

## 2024-07-09 ENCOUNTER — Encounter: Payer: Self-pay | Admitting: Oncology

## 2024-07-09 ENCOUNTER — Inpatient Hospital Stay

## 2024-07-09 ENCOUNTER — Inpatient Hospital Stay: Attending: Oncology | Admitting: Oncology

## 2024-07-09 VITALS — BP 145/80 | HR 70 | Temp 97.8°F | Resp 18 | Ht 62.0 in | Wt 152.6 lb

## 2024-07-09 DIAGNOSIS — Z79631 Long term (current) use of antimetabolite agent: Secondary | ICD-10-CM | POA: Insufficient documentation

## 2024-07-09 DIAGNOSIS — I110 Hypertensive heart disease with heart failure: Secondary | ICD-10-CM | POA: Insufficient documentation

## 2024-07-09 DIAGNOSIS — D509 Iron deficiency anemia, unspecified: Secondary | ICD-10-CM | POA: Diagnosis not present

## 2024-07-09 DIAGNOSIS — Z79899 Other long term (current) drug therapy: Secondary | ICD-10-CM | POA: Diagnosis not present

## 2024-07-09 DIAGNOSIS — I509 Heart failure, unspecified: Secondary | ICD-10-CM | POA: Insufficient documentation

## 2024-07-09 DIAGNOSIS — Z86711 Personal history of pulmonary embolism: Secondary | ICD-10-CM | POA: Insufficient documentation

## 2024-07-09 DIAGNOSIS — D6862 Lupus anticoagulant syndrome: Secondary | ICD-10-CM | POA: Diagnosis not present

## 2024-07-09 DIAGNOSIS — D7589 Other specified diseases of blood and blood-forming organs: Secondary | ICD-10-CM

## 2024-07-09 DIAGNOSIS — E119 Type 2 diabetes mellitus without complications: Secondary | ICD-10-CM | POA: Diagnosis not present

## 2024-07-09 DIAGNOSIS — Z7901 Long term (current) use of anticoagulants: Secondary | ICD-10-CM | POA: Insufficient documentation

## 2024-07-09 DIAGNOSIS — I2602 Saddle embolus of pulmonary artery with acute cor pulmonale: Secondary | ICD-10-CM

## 2024-07-09 DIAGNOSIS — Z7984 Long term (current) use of oral hypoglycemic drugs: Secondary | ICD-10-CM | POA: Insufficient documentation

## 2024-07-09 DIAGNOSIS — Z79624 Long term (current) use of inhibitors of nucleotide synthesis: Secondary | ICD-10-CM | POA: Diagnosis not present

## 2024-07-09 LAB — CMP (CANCER CENTER ONLY)
ALT: 12 U/L (ref 0–44)
AST: 24 U/L (ref 15–41)
Albumin: 4.2 g/dL (ref 3.5–5.0)
Alkaline Phosphatase: 75 U/L (ref 38–126)
Anion gap: 11 (ref 5–15)
BUN: 20 mg/dL (ref 8–23)
CO2: 20 mmol/L — ABNORMAL LOW (ref 22–32)
Calcium: 10.1 mg/dL (ref 8.9–10.3)
Chloride: 106 mmol/L (ref 98–111)
Creatinine: 1.37 mg/dL — ABNORMAL HIGH (ref 0.44–1.00)
GFR, Estimated: 42 mL/min — ABNORMAL LOW (ref >60)
Glucose, Bld: 94 mg/dL (ref 70–99)
Potassium: 4.4 mmol/L (ref 3.5–5.1)
Sodium: 137 mmol/L (ref 135–145)
Total Bilirubin: 0.4 mg/dL (ref 0.0–1.2)
Total Protein: 7 g/dL (ref 6.5–8.1)

## 2024-07-09 LAB — CBC WITH DIFFERENTIAL (CANCER CENTER ONLY)
Abs Immature Granulocytes: 0.01 K/uL (ref 0.00–0.07)
Basophils Absolute: 0 K/uL (ref 0.0–0.1)
Basophils Relative: 1 %
Eosinophils Absolute: 0.1 K/uL (ref 0.0–0.5)
Eosinophils Relative: 1 %
HCT: 37.1 % (ref 36.0–46.0)
Hemoglobin: 12.2 g/dL (ref 12.0–15.0)
Immature Granulocytes: 0 %
Lymphocytes Relative: 30 %
Lymphs Abs: 1.3 K/uL (ref 0.7–4.0)
MCH: 31.9 pg (ref 26.0–34.0)
MCHC: 32.9 g/dL (ref 30.0–36.0)
MCV: 97.1 fL (ref 80.0–100.0)
Monocytes Absolute: 0.4 K/uL (ref 0.1–1.0)
Monocytes Relative: 10 %
Neutro Abs: 2.5 K/uL (ref 1.7–7.7)
Neutrophils Relative %: 58 %
Platelet Count: 254 K/uL (ref 150–400)
RBC: 3.82 MIL/uL — ABNORMAL LOW (ref 3.87–5.11)
RDW: 12.9 % (ref 11.5–15.5)
WBC Count: 4.3 K/uL (ref 4.0–10.5)
nRBC: 0 % (ref 0.0–0.2)

## 2024-07-09 LAB — FOLATE: Folate: >40 ng/mL (ref >5.9)

## 2024-07-09 LAB — LACTATE DEHYDROGENASE: LDH: 167 U/L (ref 98–192)

## 2024-07-09 LAB — D-DIMER, QUANTITATIVE: D-Dimer, Quant: <0.27 ug{FEU}/mL (ref 0.00–0.50)

## 2024-07-09 LAB — VITAMIN B12: Vitamin B-12: 308 pg/mL (ref 180–914)

## 2024-07-09 NOTE — Assessment & Plan Note (Signed)
 Life-threatening saddle pulmonary embolism requiring mechanical thrombectomy and thrombolysis. Asymptomatic currently with no dyspnea or chest pain. Lifelong anticoagulation is necessary due to the unprovoked nature and risk of recurrence.  - Continue Eliquis  5 mg twice daily indefinitely  - Reviewed previous hypercoagulable workup which was grossly unremarkable including factor V Leiden mutation, prothrombin gene mutation, lupus anticoagulant, anticardiolipin antibodies.  Today we checked beta-2  glycoprotein antibodies to complete workup.  Rest of the thrombophilia workup will be deferred since it can be falsely abnormal given ongoing anticoagulation.  D-dimer was undetectable today.

## 2024-07-09 NOTE — Progress Notes (Signed)
 [facility]  HEMATOLOGY CLINIC CONSULTATION NOTE   PATIENT NAME: Jocelyn [name]   MR#: [id number] DOB: [birth year]  DATE OF SERVICE: 07/09/2024   REFERRING PROVIDER  [name], FNP  Patient Care Team: Benjamine Aland, MD as PCP - General (Family Medicine) Verlin Lonni BIRCH, MD as PCP - Cardiology (Cardiology) Gainer Harlene HERO, FNP (Cardiology)   REASON FOR CONSULTATION/ CHIEF COMPLAINT:  History of large saddle embolus diagnosed in September 2024.  Also has iron deficiency anemia.  ASSESSMENT & PLAN:  Jocelyn [name] is a 68 y.o. lady with a past medical history of CAD, chronic RV failure, sarcoidosis, hypertension, history of large saddle pulmonary embolus in September 2024 with PEA, treated with thrombolytics followed by mechanical aspiration, was referred to our service for evaluation, given her history of pulmonary embolism.    Hx of acute saddle pulmonary embolism in September 2024 Life-threatening saddle pulmonary embolism requiring mechanical thrombectomy and thrombolysis. Asymptomatic currently with no dyspnea or chest pain. Lifelong anticoagulation is necessary due to the unprovoked nature and risk of recurrence.  - Continue Eliquis  5 mg twice daily indefinitely  - Reviewed previous hypercoagulable workup which was grossly unremarkable including factor V Leiden mutation, prothrombin gene mutation, lupus anticoagulant, anticardiolipin antibodies.  Today we checked beta-2  glycoprotein antibodies to complete workup.  Rest of the thrombophilia workup will be deferred since it can be falsely abnormal given ongoing anticoagulation.  D-dimer was undetectable today.  Iron deficiency with evaluation for anemia Previous iron deficiency with low ferritin. Current labs show hemoglobin at 12.9 and ferritin in the low normal range. Persistent low energy may be related to iron deficiency. Iron infusion is scheduled but under evaluation due to apprehension  about the procedure. -Labs today continue to show normal hemoglobin.  MCV is now normal.  Iron studies pending.  We can probably skip IV iron infusions for her, especially given her apprehension regarding infusions and given normal iron labs.  She can continue oral iron supplements. - Order iron studies, B12, and folic acid levels - Call next Tuesday with lab results to determine if iron infusion is necessary - Consider holding off on iron infusion if labs are normal  Macrocytosis under evaluation for B12/folate deficiency and medication effect Macrocytosis with large red cell size. Possible causes include B12 or folate deficiency, or azathioprine  effect. - Order B12 and folic acid levels - Evaluate for medication effect from azathioprine   Sarcoidosis  Sarcoidosis managed with azathioprine , which may contribute to macrocytosis. Provides benefit for muscle weakness related to past statin use.  Follow-Up - Arrange phone appointment in one week to discuss lab results and determine need for iron infusion - Plan to see in six months, but may adjust based on lab results  I reviewed lab results and outside records for this visit and discussed relevant results with the patient. Diagnosis, plan of care and treatment options were also discussed in detail with the patient. Opportunity provided to ask questions and answers provided to her apparent satisfaction. Provided instructions to call our clinic with any problems, questions or concerns prior to return visit. I recommended to continue follow-up with PCP and sub-specialists. She verbalized understanding and agreed with the plan. No barriers to learning was detected.  Chinita Patten, MD  07/09/2024 4:54 PM  [facility] - A DEPT OF [facility] [address] [place] [address] Dept: [phone] Dept Fax: [phone]   HISTORY OF PRESENT ILLNESS:  Discussed the use of AI  scribe software for clinical note transcription with the patient, who gave verbal consent to proceed.  History of Present Illness Jocelyn [name] is a 68 year old female with a history of pulmonary embolism who presents for evaluation of iron deficiency and management of anticoagulation therapy. She was referred by [name], a cardiology nurse practitioner, for evaluation of iron deficiency and history of pulmonary embolism.  She has a history of a significant pulmonary embolism that occurred approximately a year ago. She experienced sudden symptoms while getting dressed, leading to an emergency hospital visit where a CT scan revealed the embolism. She was transferred to the ICU and underwent a mechanical thrombectomy and clot buster treatment after coding. She does not recall two weeks of the hospital stay. She is currently on Eliquis  5 mg twice daily for anticoagulation, having started with a higher dose of 10 mg initially.  She has a history of iron deficiency anemia, with previous low iron levels noted in April. Her current hemoglobin is 12.9, and her ferritin levels are on the lower side but within normal range. She experiences low energy. She has not yet started iron infusions and has never had an iron infusion before. She is concerned about needles and has not tried oral iron supplements.  Her past medical history includes muscle weakness attributed to statin use, for which she is on azathioprine  prescribed by her rheumatologist, Dr. Mai. She has regular monitoring of her CK levels to assess muscle damage.  She has undergone a mammogram recently and completed a Cologuard test within the past year, which was normal. Her last colonoscopy was in 2015. 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  No current chest pain, trouble breathing, or dizziness. She reports maintaining  her weight and eating well.     She denies fever, cough, diarrhea, or other infectious symptoms.  She denies epistaxis, bloody stool, melena, hematuria, bruising or other bleeding symptoms. She also denies unintentional weight loss, night sweats or other constitutional symptoms.  MEDICAL HISTORY Past Medical History:  Diagnosis Date   Acute bronchitis    Allergic rhinitis, cause unspecified    Anemia    Anxiety    B12 deficiency    BV (bacterial vaginosis) 06/22/1996   Calculus of kidney    Calculus of kidney    Coronary atherosclerosis of unspecified type of vessel, native or graft    Dizziness    Essential hypertension, benign    Fibroid 2003   Fibromyalgia    H/O dysmenorrhea 2008   H/O varicella    Headache(784.0)    frequently   HSV-2 infection 2009   Hyperplastic colon polyp 05/16/2014   Irritable bowel syndrome    Meniere's disease, unspecified    Menses, irregular 2003   Myalgia and myositis, unspecified    Myocardial infarction (HCC)    Obstructive sleep apnea (adult) (pediatric)    Perimenopausal symptoms 2003   Pure hypercholesterolemia    Sarcoidosis    Type II or unspecified type diabetes mellitus without mention of complication, not stated as uncontrolled    Unspecified venous (peripheral) insufficiency    Vitamin D  deficiency    Vulvitis 2010   Yeast infection      SURGICAL HISTORY Past Surgical History:  Procedure Laterality Date   COLONOSCOPY     CORONARY STENT INTERVENTION N/A 06/15/2017   Procedure: CORONARY  STENT INTERVENTION;  Surgeon: Dann Candyce RAMAN, MD;  Location[facility] INVASIVE CV LAB;  Service: Cardiovascular;  Laterality: N/A;   heart catherization     IR ANGIOGRAM PULMONARY BILATERAL SELECTIVE  08/04/2023   IR ANGIOGRAM SELECTIVE EACH ADDITIONAL VESSEL  08/04/2023   IR ANGIOGRAM SELECTIVE EACH ADDITIONAL VESSEL  08/04/2023   IR THROMBECT PRIM MECH INIT (INCLU) MOD SED  08/04/2023   IR US  GUIDE VASC ACCESS RIGHT  08/04/2023   LEFT HEART CATH  AND CORONARY ANGIOGRAPHY N/A 06/15/2017   Procedure: LEFT HEART CATH AND CORONARY ANGIOGRAPHY;  Surgeon: Dann Candyce RAMAN, MD;  Location: [facility] INVASIVE CV LAB;  Service: Cardiovascular;  Laterality: N/A;   RADIOLOGY WITH ANESTHESIA N/A 08/04/2023   Procedure: IR WITH ANESTHESIA;  Surgeon: Dolphus Carrion, MD;  Location: [facility] OR;  Service: Radiology;  Laterality: N/A;   TONSILLECTOMY       SOCIAL HISTORY: She reports that she has never smoked. She has never used smokeless tobacco. She reports that she does not drink alcohol and does not use drugs. Social History   Socioeconomic History   Marital status: Single    Spouse name: Not on file   Number of children: 1   Years of education: Not on file   Highest education level: Master's degree (e.g., MA, MS, MEng, MEd, MSW, MBA)  Occupational History   Occupation: Retired Runner, broadcasting/film/video   Tobacco Use   Smoking status: Never   Smokeless tobacco: Never   Tobacco comments:    Daily Caffeine - 1  Exercise 2-3 times/weekly  Vaping Use   Vaping status: Never Used  Substance and Sexual Activity   Alcohol use: No    Alcohol/week: 0.0 standard drinks of alcohol   Drug use: No   Sexual activity: Yes    Birth control/protection: None  Other Topics Concern   Not on file  Social History Narrative   Exercises 2-3 times weekly   Caffeine Use: 1 daily (tea or Pepsi)   Lives alone in a one story home.  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  Retired Midwife.     Social Drivers of Corporate investment banker Strain: Low Risk  (08/05/2023)   Overall Financial Resource Strain (CARDIA)    Difficulty of Paying Living Expenses: Not hard at all  Food Insecurity: No Food Insecurity (07/09/2024)   Hunger Vital Sign    Worried About Running Out of Food in the Last Year: Never true    Ran Out of Food in the Last Year: Never true  Transportation Needs: No Transportation Needs (07/09/2024)   PRAPARE - Administrator, Civil Service (Medical): No    Lack of  Transportation (Non-Medical): No  Physical Activity: Not on file  Stress: No Stress Concern Present (08/05/2023)   [employer] of Occupational Health - Occupational Stress Questionnaire    Feeling of Stress : Not at all  Social Connections: Not on file  Intimate Partner Violence: Not At Risk (07/09/2024)   Humiliation, Afraid, Rape, and Kick questionnaire    Fear of Current or Ex-Partner: No    Emotionally Abused: No    Physically Abused: No    Sexually Abused: No    FAMILY HISTORY: Her family history includes Other in an other family member. She was adopted.  CURRENT MEDICATIONS   Current Outpatient Medications  Medication Instructions   ALPRAZolam  (XANAX ) 0.5 mg, 3 times daily   apixaban  (ELIQUIS ) 5 MG TABS tablet Take 10 mg by mouth twice daily, then on 08/17/2023-switch to 5 mg  by mouth twice daily.   azaTHIOprine  (IMURAN ) 50 MG tablet 1 tablet, Daily   Empagliflozin -metFORMIN  HCl ER (SYNJARDY XR) 25-1000 MG TB24 25-1,000 tablets, Daily   GEMTESA 75 MG TABS 1 tablet, Daily   icosapent  Ethyl (VASCEPA ) 1 g capsule TAKE 2 CAPSULES(2 GRAMS) BY MOUTH TWICE DAILY   metoprolol  succinate (TOPROL -XL) 100 mg, Oral, Daily, Take with or immediately following a meal.   Nexletol  180 mg, Oral, Daily   nitroGLYCERIN  (NITROSTAT ) 0.4 mg, Sublingual, Every 5 min PRN   ondansetron  (ZOFRAN ) 4 mg, Oral, Every 6 hours   ONETOUCH VERIO test strip CHECK BLOOD SUGAR TID AS DIRECTED   pantoprazole  (PROTONIX ) 40 mg, Oral, Daily   PRALUENT  150 MG/ML SOAJ ADMINISTER 1 ML UNDER THE SKIN EVERY 14 DAYS     ALLERGIES  She is allergic to actos [pioglitazone], codeine , januvia [sitagliptin], lactose intolerance (gi), morphine, and repatha  [evolocumab ].  REVIEW OF SYSTEMS:  Review of Systems - Oncology   Rest of the pertinent review of systems is unremarkable except as mentioned above in HPI.  PHYSICAL EXAMINATION:     Onc Performance Status - 07/09/24 1400       ECOG Perf Status   ECOG Perf  Status Restricted in physically strenuous activity but ambulatory and able to carry out work of a light or sedentary nature, e.g., light house work, office work      KPS SCALE   KPS % SCORE Normal activity with effort, some s/s of disease          Vitals:   07/09/24 1415 07/09/24 1416  BP: (!) 155/86 (!) 145/80  Pulse: 70   Resp: 18   Temp: 97.8 F (36.6 C)   SpO2: 98%    Filed Weights   07/09/24 1415  Weight: 152 lb 9.6 oz (69.2 kg)    Physical Exam Constitutional:      General: She is not in acute distress.    Appearance: Normal appearance.  HENT:     Head: Normocephalic and atraumatic.  Cardiovascular:     Rate and Rhythm: Normal rate.     Heart sounds: Normal heart sounds.  Pulmonary:     Effort: Pulmonary effort is normal. No respiratory distress.     Breath sounds: Normal breath sounds.  Abdominal:     General: There is no distension.  Neurological:     General: No focal deficit present.     Mental Status: She is alert and oriented to person, place, and time.  Psychiatric:        Mood and Affect: Mood normal.        Behavior: Behavior normal.      LABORATORY DATA:   I have reviewed the data as listed.  Results for orders placed or performed in visit on 07/09/24  D-dimer, quantitative  Result Value Ref Range   D-Dimer, Quant <0.27 0.00 - 0.50 ug/mL-FEU  Lactate dehydrogenase  Result Value Ref Range   LDH 167 98 - 192 U/L  CMP (Cancer Center only)  Result Value Ref Range   Sodium 137 135 - 145 mmol/L   Potassium 4.4 3.5 - 5.1 mmol/L   Chloride 106 98 - 111 mmol/L   CO2 20 (L) 22 - 32 mmol/L   Glucose, Bld 94 70 - 99 mg/dL   BUN 20 8 - 23 mg/dL   Creatinine 8.62 (H) 9.55 - 1.00 mg/dL   Calcium  10.1 8.9 - 10.3 mg/dL   Total Protein 7.0 6.5 - 8.1 g/dL   Albumin 4.2 3.5 -  5.0 g/dL   AST 24 15 - 41 U/L   ALT 12 0 - 44 U/L   Alkaline Phosphatase 75 38 - 126 U/L   Total Bilirubin 0.4 0.0 - 1.2 mg/dL   GFR, Estimated 42 (L) >60 mL/min   Anion gap  11 5 - 15  CBC with Differential (Cancer Center Only)  Result Value Ref Range   WBC Count 4.3 4.0 - 10.5 K/uL   RBC 3.82 (L) 3.87 - 5.11 MIL/uL   Hemoglobin 12.2 12.0 - 15.0 g/dL   HCT 62.8 63.9 - 53.9 %   MCV 97.1 80.0 - 100.0 fL   MCH 31.9 26.0 - 34.0 pg   MCHC 32.9 30.0 - 36.0 g/dL   RDW 87.0 88.4 - 84.4 %   Platelet Count 254 150 - 400 K/uL   nRBC 0.0 0.0 - 0.2 %   Neutrophils Relative % 58 %   Neutro Abs 2.5 1.7 - 7.7 K/uL   Lymphocytes Relative 30 %   Lymphs Abs 1.3 0.7 - 4.0 K/uL   Monocytes Relative 10 %   Monocytes Absolute 0.4 0.1 - 1.0 K/uL   Eosinophils Relative 1 %   Eosinophils Absolute 0.1 0.0 - 0.5 K/uL   Basophils Relative 1 %   Basophils Absolute 0.0 0.0 - 0.1 K/uL   Immature Granulocytes 0 %   Abs Immature Granulocytes 0.01 0.00 - 0.07 K/uL     RADIOGRAPHIC STUDIES: No pertinent imaging studies available to review.  Orders Placed This Encounter  Procedures   CBC with Differential (Cancer Center Only)    Standing Status:   Future    Number of Occurrences:   1    Expiration Date:   07/09/2025   CMP (Cancer Center only)    Standing Status:   Future    Number of Occurrences:   1    Expiration Date:   07/09/2025   Lactate dehydrogenase    Standing Status:   Future    Number of Occurrences:   1    Expiration Date:   07/09/2025   D-dimer, quantitative    Standing Status:   Future    Number of Occurrences:   1    Expiration Date:   07/09/2025   Beta-2 -glycoprotein i abs, IgG/M/A    Standing Status:   Future    Number of Occurrences:   1    Expiration Date:   07/09/2025   Folate    Standing Status:   Future    Number of Occurrences:   1    Expiration Date:   07/09/2025   Vitamin B12    Standing Status:   Future    Number of Occurrences:   1    Expiration Date:   07/09/2025   Methylmalonic acid, serum    Standing Status:   Future    Number of Occurrences:   1    Expiration Date:   07/09/2025   Iron and TIBC    Standing Status:   Future    Expiration  Date:   07/09/2025   Ferritin    Standing Status:   Future    Expiration Date:   07/09/2025   Reticulocytes    Standing Status:   Future    Expiration Date:   07/09/2025    Future Appointments  Date Time Provider Department Center  07/17/2024  3:50 PM Lavalle Skoda, Chinita, MD [facility] None  07/20/2024  1:30 PM [facility] None  07/30/2024  1:45 PM Gregg Lek, MD [facility] None  09/18/2024  2:30 PM Geronimo Amel, MD [facility] None  01/08/2025  3:00 PM [facility] PHLEBOTOMIST [facility] None  01/08/2025  3:30 PM Kynnedy Carreno, Chinita, MD [facility] None    I spent a total of 55 minutes during this encounter with the patient including review of chart and various tests results, discussions about plan of care and coordination of care plan.  This document was completed utilizing speech recognition software. Grammatical errors, random word insertions, pronoun errors, and incomplete sentences are an occasional consequence of this system due to software limitations, ambient noise, and hardware issues. Any formal questions or concerns about the content, text or information contained within the body of this dictation should be directly addressed to the provider for clarification.

## 2024-07-10 ENCOUNTER — Ambulatory Visit

## 2024-07-11 ENCOUNTER — Telehealth (HOSPITAL_COMMUNITY): Payer: Self-pay

## 2024-07-11 LAB — BETA-2-GLYCOPROTEIN I ABS, IGG/M/A
Beta-2 Glyco I IgG: <9 GPI IgG units (ref 0–20)
Beta-2-Glycoprotein I IgA: <9 GPI IgA units (ref 0–25)
Beta-2-Glycoprotein I IgM: <9 GPI IgM units (ref 0–32)

## 2024-07-11 NOTE — Telephone Encounter (Signed)
 Pt reached out to ask about moving our appoint from this week to next week.  Will get her resch for next week.   [name], EMT-Paramedic  [phone] 07/11/2024

## 2024-07-12 DIAGNOSIS — I2699 Other pulmonary embolism without acute cor pulmonale: Secondary | ICD-10-CM | POA: Diagnosis not present

## 2024-07-15 LAB — METHYLMALONIC ACID, SERUM: Methylmalonic Acid, Quantitative: 181 nmol/L (ref 0–378)

## 2024-07-17 ENCOUNTER — Encounter: Payer: Self-pay | Admitting: Oncology

## 2024-07-17 ENCOUNTER — Inpatient Hospital Stay: Attending: Oncology | Admitting: Oncology

## 2024-07-17 DIAGNOSIS — D7589 Other specified diseases of blood and blood-forming organs: Secondary | ICD-10-CM

## 2024-07-17 DIAGNOSIS — I2602 Saddle embolus of pulmonary artery with acute cor pulmonale: Secondary | ICD-10-CM

## 2024-07-17 DIAGNOSIS — D509 Iron deficiency anemia, unspecified: Secondary | ICD-10-CM | POA: Diagnosis not present

## 2024-07-17 NOTE — Assessment & Plan Note (Signed)
 Previously she had macrocytosis and it could have been from azathioprine .  B12, folate, methylmalonic acid were all within normal limits.  Recent MCV was normal at 97.

## 2024-07-17 NOTE — Assessment & Plan Note (Signed)
 History of iron  deficiency with low ferritin. Current labs show hemoglobin at 12.2 and ferritin in the low normal range. Persistent low energy may be related to iron  deficiency. Iron  infusion is scheduled at infusion center on [address] later this week.

## 2024-07-17 NOTE — Assessment & Plan Note (Signed)
 Life-threatening saddle pulmonary embolism requiring mechanical thrombectomy and thrombolysis. Asymptomatic currently with no dyspnea or chest pain. Lifelong anticoagulation is necessary due to the unprovoked nature and risk of recurrence.  - Continue Eliquis  5 mg twice daily indefinitely  Reviewed previous hypercoagulable workup which was grossly unremarkable including factor V Leiden mutation, prothrombin gene mutation, lupus anticoagulant, anticardiolipin antibodies.  On her consultation with us  on 07/09/2024, beta-2  glycoprotein antibodies were negative.  D-dimer was undetectable.  Rest of the thrombophilia workup was deferred since it can be falsely abnormal given ongoing anticoagulation.

## 2024-07-17 NOTE — Progress Notes (Signed)
 [facility]  HEMATOLOGY-ONCOLOGY ELECTRONIC VISIT PROGRESS NOTE  PATIENT NAME: Jocelyn [name]   MR#: [id number] DOB: 01-12-1956  DATE OF SERVICE: 07/17/2024  Patient Care Team: Jocelyn Aland, MD as PCP - General (Family Medicine) Jocelyn Lonni BIRCH, MD as PCP - Cardiology (Cardiology) Taylor, Jocelyn HERO, FNP (Cardiology)  I connected with the patient via telephone conference and verified that I am speaking with the correct person using two identifiers. The patient's location is at home and I am providing care from the [facility].  I discussed the limitations, risks, security and privacy concerns of performing an evaluation and management service by e-visits and the availability of in person appointments. I also discussed with the patient that there may be a patient responsible charge related to this service. The patient expressed understanding and agreed to proceed.   ASSESSMENT & PLAN:   Jocelyn [name] is a 68 y.o. lady with a past medical history of CAD, chronic RV failure, sarcoidosis, hypertension, history of large saddle pulmonary embolus in September 2024 with PEA, treated with thrombolytics followed by mechanical aspiration, was referred to our service for evaluation in August 2025, given her history of pulmonary embolism.    Hx of acute saddle pulmonary embolism in September 2024 Life-threatening saddle pulmonary embolism requiring mechanical thrombectomy and thrombolysis. Asymptomatic currently with no dyspnea or chest pain. Lifelong anticoagulation is necessary due to the unprovoked nature and risk of recurrence.  - Continue Eliquis  5 mg twice daily indefinitely  Reviewed previous hypercoagulable workup which was grossly unremarkable including factor V Leiden mutation, prothrombin gene mutation, lupus anticoagulant, anticardiolipin antibodies.  On her consultation with us  on 07/09/2024, beta-2  glycoprotein antibodies were negative.  D-dimer was undetectable.   Rest of the thrombophilia workup was deferred since it can be falsely abnormal given ongoing anticoagulation.   Macrocytosis Previously she had macrocytosis and it could have been from azathioprine .  B12, folate, methylmalonic acid were all within normal limits.  Recent MCV was normal at 97.  Iron  (Fe) deficiency anemia History of iron  deficiency with low ferritin. Current labs show hemoglobin at 12.2 and ferritin in the low normal range. Persistent low energy may be related to iron  deficiency. Iron  infusion is scheduled at infusion center on [address] later this week.   I discussed the assessment and treatment plan with the patient. The patient was provided an opportunity to ask questions and all were answered. The patient agreed with the plan and demonstrated an understanding of the instructions. The patient was advised to call back or seek an in-person evaluation if the symptoms worsen or if the condition fails to improve as anticipated.    I spent 12 minutes over the phone with the patient reviewing test results, discuss management and coordination/planning of care.  Jocelyn Patten, MD 07/17/2024 4:25 PM [facility] - A DEPT OF [name] [address] [place] [address] Dept: [phone] Dept Fax: [phone]   INTERVAL HISTORY:  Please see above for problem oriented charting.  The purpose of today's discussion is to explain recent lab results and to formulate plan of care.  Discussed the use of AI scribe software for clinical note transcription with the patient, who gave verbal consent to proceed.  History of Present Illness Jocelyn [name] is a 68 year old female with a history of blood clots who presents for follow-up on recent lab results and management of anticoagulation therapy.  Recent blood work  showed normal blood counts, including hemoglobin at 12.2, normal white count, and platelet count.  Electrolytes and liver function tests were also normal. Creatinine level was 1.37, stable compared to a previous level of 1.4 in July.  She has a history of fluctuating red blood cell size, previously noted to be large, which prompted testing for vitamin B12 and other deficiencies. Current tests show normal red blood cell size at 97, and normal levels of vitamin B12, folic acid, and methylmalonic acid. She is currently on azathioprine , which may cause fluctuations in red blood cell size.  She is on Eliquis  for anticoagulation due to her history of blood clots. Recent D-dimer testing was undetectable.  She inquired about the possibility of an iron  infusion to address symptoms such as low energy, sleep issues, and leg cramps, although her recent iron  levels were not concerning. Previous investigations, including a beta-2  glycoprotein antibody test, did not reveal any mutations or abnormalities to explain her blood clot history. Other tests conducted during a prior hospitalization were also unremarkable.     SUMMARY OF HEMATOLOGY HISTORY:  She was referred by Jocelyn [name], a cardiology nurse practitioner, for evaluation of iron  deficiency and history of pulmonary embolism.   She has a history of a significant pulmonary embolism that occurred approximately a year ago. She experienced sudden symptoms while getting dressed, leading to an emergency hospital visit where a CT scan revealed the embolism. She was transferred to the ICU and underwent a mechanical thrombectomy and clot buster treatment after coding. She does not recall two weeks of the hospital stay. She is currently on Eliquis  5 mg twice daily for anticoagulation, having started with a higher dose of 10 mg initially.   She has a history of iron  deficiency anemia, with previous low iron  levels noted in April. Her recent hemoglobin was 12.9, and her ferritin levels were on the lower side but within normal range. She experiences low energy.     Her past medical history includes muscle weakness attributed to statin use, for which she is on azathioprine  prescribed by her rheumatologist, Dr. Mai. She has regular monitoring of her CK levels to assess muscle damage.   She has undergone a mammogram recently and completed a Cologuard test within the past year, which was normal. Her last colonoscopy was in 2015. 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   No current chest pain, trouble breathing, or dizziness. She reports maintaining her weight and eating well.  Life-threatening saddle pulmonary embolism requiring mechanical thrombectomy and thrombolysis. Asymptomatic currently with no dyspnea or chest pain. Lifelong anticoagulation is necessary due to the unprovoked nature and risk of recurrence.   Continue Eliquis  5 mg twice daily indefinitely   Reviewed previous hypercoagulable workup which was grossly unremarkable including factor V Leiden mutation, prothrombin gene mutation, lupus anticoagulant, anticardiolipin antibodies.  On her consultation with us  on 07/09/2024, beta-2  glycoprotein antibodies were negative.  D-dimer was undetectable.  Rest of the thrombophilia workup was deferred since it can be falsely abnormal given ongoing anticoagulation.   History of iron  deficiency with low ferritin. Current labs show hemoglobin at 12.2 and ferritin in the low normal range. Persistent low energy may be related to iron  deficiency. Iron  infusion is scheduled at infusion center on [address] later this week.  Previously she had macrocytosis and it could have been from azathioprine .  B12, folate, methylmalonic acid  were all within normal limits.  REVIEW OF SYSTEMS:    Review of Systems - Oncology  All other pertinent systems were reviewed with the patient and are negative.  I have reviewed the past medical history, past  surgical history, social history and family history with the patient and they are unchanged from previous note.  ALLERGIES:  She is allergic to actos [pioglitazone], codeine , januvia [sitagliptin], lactose intolerance (gi), morphine, and repatha  [evolocumab ].  MEDICATIONS:  Current Outpatient Medications  Medication Sig Dispense Refill   ALPRAZolam  (XANAX ) 0.5 MG tablet Take 0.5 mg by mouth 3 (three) times daily.     apixaban  (ELIQUIS ) 5 MG TABS tablet Take 10 mg by mouth twice daily, then on 08/17/2023-switch to 5 mg by mouth twice daily. 180 tablet 3   azaTHIOprine  (IMURAN ) 50 MG tablet Take 1 tablet by mouth daily.     Bempedoic Acid  (NEXLETOL ) 180 MG TABS Take 1 tablet (180 mg total) by mouth daily. 90 tablet 3   Empagliflozin -metFORMIN  HCl ER (SYNJARDY XR) 25-1000 MG TB24 Take 25-1,000 tablets by mouth daily.     GEMTESA 75 MG TABS Take 1 tablet by mouth daily.     icosapent  Ethyl (VASCEPA ) 1 g capsule TAKE 2 CAPSULES(2 GRAMS) BY MOUTH TWICE DAILY 360 capsule 2   metoprolol  succinate (TOPROL -XL) 100 MG 24 hr tablet Take 1 tablet (100 mg total) by mouth daily. Take with or immediately following a meal. 90 tablet 3   nitroGLYCERIN  (NITROSTAT ) 0.4 MG SL tablet Place 1 tablet (0.4 mg total) under the tongue every 5 (five) minutes as needed for chest pain. 25 tablet 2   ondansetron  (ZOFRAN ) 4 MG tablet Take 1 tablet (4 mg total) by mouth every 6 (six) hours. (Patient not taking: Reported on 07/09/2024) 12 tablet 0   ONETOUCH VERIO test strip CHECK BLOOD SUGAR TID AS DIRECTED     pantoprazole  (PROTONIX ) 40 MG tablet Take 1 tablet (40 mg total) by mouth daily. (Patient not taking: Reported on 07/09/2024) 30 tablet 0   PRALUENT  150 MG/ML SOAJ ADMINISTER 1 ML UNDER THE SKIN EVERY 14 DAYS 6 mL 3   No current facility-administered medications for this visit.    PHYSICAL EXAMINATION:   Onc Performance Status - 07/17/24 1600       ECOG Perf Status   ECOG Perf Status Restricted in physically  strenuous activity but ambulatory and able to carry out work of a light or sedentary nature, e.g., light house work, office work      KPS SCALE   KPS % SCORE Normal activity with effort, some s/s of disease          LABORATORY DATA:   I have reviewed the data as listed.  Recent Results (from the past 2160 hours)  Basic metabolic panel with GFR     Status: Abnormal   Collection Time: 06/06/24  3:19 PM  Result Value Ref Range   Sodium 135 135 - 145 mmol/L   Potassium 4.9 3.5 - 5.1 mmol/L   Chloride 106 98 - 111 mmol/L   CO2 19 (L) 22 - 32 mmol/L   Glucose, Bld 95 70 - 99 mg/dL    Comment: Glucose reference range applies only to samples taken after fasting for at least 8 hours.   BUN 29 (H) 8 - 23 mg/dL   Creatinine, Ser 8.59 (H) 0.44 - 1.00 mg/dL   Calcium  9.8 8.9 - 10.3 mg/dL   GFR, Estimated 41 (L) >60 mL/min    Comment: (NOTE) Calculated using  the CKD-EPI Creatinine Equation (2021)    Anion gap 10 5 - 15    Comment: Performed at [facility], [address], [place], [place] [address]  B Nat Peptide     Status: None   Collection Time: 06/06/24  3:19 PM  Result Value Ref Range   B Natriuretic Peptide 18.5 0.0 - 100.0 pg/mL    Comment: Performed at [facility], [address] [place], [place] [address]  Lipid panel     Status: Abnormal   Collection Time: 06/06/24  3:19 PM  Result Value Ref Range   Cholesterol 133 0 - 200 mg/dL   Triglycerides 775 (H) <150 mg/dL   HDL 35 (L) >59 mg/dL   Total CHOL/HDL Ratio 3.8 RATIO   VLDL 45 (H) 0 - 40 mg/dL   LDL Cholesterol 53 0 - 99 mg/dL    Comment:        Total Cholesterol/HDL:CHD Risk Coronary Heart Disease Risk Table                     Men   Women  1/2 Average Risk   3.4   3.3  Average Risk       5.0   4.4  2 X Average Risk   9.6   7.1  3 X Average Risk  23.4   11.0        Use the calculated Patient Ratio above and the CHD Risk Table to determine the patient's CHD Risk.        ATP III CLASSIFICATION  (LDL):  <100     mg/dL   Optimal  899-870  mg/dL   Near or Above                    Optimal  130-159  mg/dL   Borderline  839-810  mg/dL   High  >809     mg/dL   Very High Performed at [facility], [address] [place], [place] [address]   Ferritin     Status: None   Collection Time: 06/06/24  3:19 PM  Result Value Ref Range   Ferritin 27 11 - 307 ng/mL    Comment: Performed at [facility], [address], [place], [place] [address]  Iron  Binding Cap (TIBC)([facility])     Status: Abnormal   Collection Time: 06/06/24  3:19 PM  Result Value Ref Range   Iron  55 28 - 170 ug/dL   TIBC 547 (H) 749 - 549 ug/dL   Saturation Ratios 12 10.4 - 31.8 %   UIBC 397 ug/dL    Comment: Performed at [facility], [address] [place], [place] [address]  CBC     Status: Abnormal   Collection Time: 06/06/24  3:19 PM  Result Value Ref Range   WBC 4.1 4.0 - 10.5 K/uL   RBC 3.87 3.87 - 5.11 MIL/uL   Hemoglobin 12.9 12.0 - 15.0 g/dL   HCT 59.5 63.9 - 53.9 %   MCV 104.4 (H) 80.0 - 100.0 fL   MCH 33.3 26.0 - 34.0 pg   MCHC 31.9 30.0 - 36.0 g/dL   RDW 86.9 88.4 - 84.4 %   Platelets 255 150 - 400 K/uL   nRBC 0.0 0.0 - 0.2 %    Comment: Performed at [facility], [address], [place], [place] [address]  Methylmalonic acid, serum     Status: None  Collection Time: 07/09/24  3:12 PM  Result Value Ref Range   Methylmalonic Acid, Quantitative 181 0 - 378 nmol/L    Comment: (NOTE) This test was developed and its performance characteristics determined by [facility]. It has not been cleared or approved by the Food and Drug Administration. Performed At: [facility] [address] [place], [place] [id number] Jennette Shorter MD Ey:[id number]   Vitamin B12     Status: None   Collection Time: 07/09/24  3:12 PM  Result Value Ref Range   Vitamin B-12 308 180 - 914 pg/mL    Comment: (NOTE) This assay is not validated for testing neonatal or myeloproliferative  syndrome specimens for Vitamin B12 levels. Performed at [facility], [address], [place], [place] [address]   Folate     Status: None   Collection Time: 07/09/24  3:12 PM  Result Value Ref Range   Folate >40.0 >5.9 ng/mL    Comment: Performed at [facility], [address] [place], [place] [address]  Beta-2 -glycoprotein i abs, IgG/M/A     Status: None   Collection Time: 07/09/24  3:12 PM  Result Value Ref Range   Beta-2  Glyco I IgG <9 0 - 20 GPI IgG units   Beta-2 -Glycoprotein I IgM <9 0 - 32 GPI IgM units    Comment: (NOTE) Performed At: [facility] [address] [place], [place] [id number] Jennette Shorter MD Ey:[id number]    Beta-2 -Glycoprotein I IgA <9 0 - 25 GPI IgA units  D-dimer, quantitative     Status: None   Collection Time: 07/09/24  3:12 PM  Result Value Ref Range   D-Dimer, Quant <0.27 0.00 - 0.50 ug/mL-FEU    Comment: (NOTE) At the manufacturer cut-off value of 0.5 g/mL FEU, this assay has a negative predictive value of 95-100%.This assay is intended for use in conjunction with a clinical pretest probability (PTP) assessment model to exclude pulmonary embolism (PE) and deep venous thrombosis (DVT) in outpatients suspected of PE or DVT. Results should be correlated with clinical presentation. Performed at [facility], [address] [place], [place] [address]   Lactate dehydrogenase     Status: None   Collection Time: 07/09/24  3:12 PM  Result Value Ref Range   LDH 167 98 - 192 U/L    Comment: Performed at [facility], [address] [place], [place] [address]  CMP (Cancer Center only)     Status: Abnormal   Collection Time: 07/09/24  3:12 PM  Result Value Ref Range   Sodium 137 135 - 145 mmol/L   Potassium 4.4 3.5 - 5.1 mmol/L   Chloride 106 98 - 111 mmol/L   CO2 20 (L) 22 - 32 mmol/L   Glucose, Bld 94 70 - 99 mg/dL    Comment: Glucose reference range applies only to samples taken  after fasting for at least 8 hours.   BUN 20 8 - 23 mg/dL   Creatinine 8.62 (H) 9.55 - 1.00 mg/dL   Calcium  10.1 8.9 - 10.3 mg/dL   Total Protein 7.0 6.5 - 8.1 g/dL   Albumin 4.2 3.5 - 5.0 g/dL   AST 24 15 - 41 U/L   ALT 12 0 - 44 U/L   Alkaline Phosphatase 75 38 - 126 U/L   Total Bilirubin 0.4 0.0 - 1.2 mg/dL   GFR, Estimated 42 (L) >60 mL/min    Comment: (NOTE) Calculated using the CKD-EPI Creatinine Equation (2021)    Anion gap 11 5 -  15    Comment: Performed at [facility], [address] [place], [place] [address]  CBC with Differential (Cancer Center Only)     Status: Abnormal   Collection Time: 07/09/24  3:12 PM  Result Value Ref Range   WBC Count 4.3 4.0 - 10.5 K/uL   RBC 3.82 (L) 3.87 - 5.11 MIL/uL   Hemoglobin 12.2 12.0 - 15.0 g/dL   HCT 62.8 63.9 - 53.9 %   MCV 97.1 80.0 - 100.0 fL   MCH 31.9 26.0 - 34.0 pg   MCHC 32.9 30.0 - 36.0 g/dL   RDW 87.0 88.4 - 84.4 %   Platelet Count 254 150 - 400 K/uL   nRBC 0.0 0.0 - 0.2 %   Neutrophils Relative % 58 %   Neutro Abs 2.5 1.7 - 7.7 K/uL   Lymphocytes Relative 30 %   Lymphs Abs 1.3 0.7 - 4.0 K/uL   Monocytes Relative 10 %   Monocytes Absolute 0.4 0.1 - 1.0 K/uL   Eosinophils Relative 1 %   Eosinophils Absolute 0.1 0.0 - 0.5 K/uL   Basophils Relative 1 %   Basophils Absolute 0.0 0.0 - 0.1 K/uL   Immature Granulocytes 0 %   Abs Immature Granulocytes 0.01 0.00 - 0.07 K/uL    Comment: Performed at [facility], [address] [place], [place] [address]     RADIOGRAPHIC STUDIES:  No recent pertinent imaging studies available to review.  Orders Placed This Encounter  Procedures   CBC with Differential (Cancer Center Only)    Standing Status:   Future    Expected Date:   01/08/2025    Expiration Date:   04/08/2025   CMP (Cancer Center only)    Standing Status:   Future    Expected Date:   01/08/2025    Expiration Date:   04/08/2025   Iron  and TIBC    Standing Status:    Future    Expected Date:   01/08/2025    Expiration Date:   04/08/2025   Ferritin    Standing Status:   Future    Expected Date:   01/08/2025    Expiration Date:   04/08/2025   D-dimer, quantitative    Standing Status:   Future    Expected Date:   01/08/2025    Expiration Date:   07/17/2025     Future Appointments  Date Time Provider Department Center  07/20/2024  1:30 PM [facility] None  07/30/2024  1:45 PM Gregg Lek, MD [facility] None  09/18/2024  2:30 PM Geronimo Amel, MD [facility] [address]  01/08/2025  3:00 PM [facility] PHLEBOTOMIST [facility] None  01/08/2025  3:30 PM Avyonna Wagoner, Chinita, MD [facility] None    This document was completed utilizing speech recognition software. Grammatical errors, random word insertions, pronoun errors, and incomplete sentences are an occasional consequence of this system due to software limitations, ambient noise, and hardware issues. Any formal questions or concerns about the content, text or information contained within the body of this dictation should be directly addressed to the provider for clarification.

## 2024-07-18 ENCOUNTER — Other Ambulatory Visit (HOSPITAL_COMMUNITY): Payer: Self-pay

## 2024-07-18 MED ORDER — APIXABAN 5 MG PO TABS
5.0000 mg | ORAL_TABLET | Freq: Two times a day (BID) | ORAL | 3 refills | Status: AC
  Filled 2024-07-18: qty 180, 90d supply, fill #0
  Filled 2024-10-24: qty 180, 90d supply, fill #1

## 2024-07-18 NOTE — Progress Notes (Signed)
 Paramedicine Encounter    Patient ID: Jocelyn [name], female    DOB: 09/08/1956, 68 y.o.   MRN: [id number]   Complaints-tired-is due for iron  infusion this week   Edema-none   Compliance with meds-yes   Pill box filled-n/a If so, by whom-she does it herself   Refills needed-eliquis - sent  message to triage ref this refill- it was sent in [facility], so I called [facility] to get them to pull it over.    Pt reports she is doing ok, she report still tired a lot, she is due for iron  infusion on Friday.  She denies increased sob, no dizziness.  Pt went to hematologist-no changes.  She still needs to see the rheumatologist.  She has seen nephrology and wanted her back in 6 mths.  She is due back in heart clinic in [place] so she needs to call in nov to set that up with echo.   Meds verified.  She does her own pill box, no errors.  Will f/u in a couple wks.  BP 120/70   Pulse 65   Resp 16   SpO2 97%  Weight yesterday-149 Last visit weight-148  Patient Care Team: Benjamine Aland, MD as PCP - General (Family Medicine) Verlin Lonni BIRCH, MD as PCP - Cardiology (Cardiology) Glena Harlene HERO, FNP (Cardiology)  Patient Active Problem List   Diagnosis Date Noted   Macrocytosis 07/09/2024   Physical deconditioning 08/23/2023   Acute respiratory failure with hypoxia (HCC) 08/05/2023   Hx of acute saddle pulmonary embolism in September 2024 08/03/2023   Pulmonary edema 08/03/2023   Degenerative arthritis of knee 08/20/2021   Hx of pleural effusion    Dyspnea 08/16/2018   Fall on or from sidewalk curb, initial encounter 05/31/2018   History of acute inferior wall MI 08/22/2017   Diabetes mellitus (HCC)    S/P right coronary artery (RCA) stent placement    Overweight 05/05/2017   Diabetes mellitus type 2, controlled, without complications (HCC) 12/16/2016   Asthmatic bronchitis , chronic (HCC) 05/10/2016   Polymyositis (HCC) 09/15/2015   History of  sarcoidosis 09/15/2015   Falling 03/13/2015   Edema 02/13/2014   Coronary artery disease involving native heart without angina pectoris 10/29/2013   Hyperlipidemia 10/29/2013   Iron  (Fe) deficiency anemia 08/08/2013   Vitamin B 12 deficiency 08/08/2013   GERD (gastroesophageal reflux disease) 09/08/2011   Weakness 02/22/2011   Depression 02/22/2011   MENIERE'S DISEASE 04/30/2009   Vitamin D  deficiency 02/05/2009   Venous (peripheral) insufficiency 12/12/2007   RENAL CALCULUS 12/12/2007   DIZZINESS 12/12/2007   HYPERCHOLESTEROLEMIA 09/16/2007   Anxiety state 09/16/2007   HYPERTENSION, BENIGN 09/16/2007   Allergic rhinitis 09/16/2007   IRRITABLE BOWEL SYNDROME 09/16/2007   Myalgia and myositis 09/16/2007    Current Outpatient Medications:    ALPRAZolam  (XANAX ) 0.5 MG tablet, Take 0.5 mg by mouth 3 (three) times daily., Disp: , Rfl:    apixaban  (ELIQUIS ) 5 MG TABS tablet, Take 10 mg by mouth twice daily, then on 08/17/2023-switch to 5 mg by mouth twice daily., Disp: 180 tablet, Rfl: 3   azaTHIOprine  (IMURAN ) 50 MG tablet, Take 1 tablet by mouth daily., Disp: , Rfl:    Bempedoic Acid  (NEXLETOL ) 180 MG TABS, Take 1 tablet (180 mg total) by mouth daily., Disp: 90 tablet, Rfl: 3   Empagliflozin -metFORMIN  HCl ER (SYNJARDY XR) 25-1000 MG TB24, Take 25-1,000 tablets by mouth daily., Disp: , Rfl:    GEMTESA 75 MG TABS, Take 1  tablet by mouth daily., Disp: , Rfl:    icosapent  Ethyl (VASCEPA ) 1 g capsule, TAKE 2 CAPSULES(2 GRAMS) BY MOUTH TWICE DAILY, Disp: 360 capsule, Rfl: 2   metoprolol  succinate (TOPROL -XL) 100 MG 24 hr tablet, Take 1 tablet (100 mg total) by mouth daily. Take with or immediately following a meal., Disp: 90 tablet, Rfl: 3   pantoprazole  (PROTONIX ) 40 MG tablet, Take 1 tablet (40 mg total) by mouth daily., Disp: 30 tablet, Rfl: 0   PRALUENT  150 MG/ML SOAJ, ADMINISTER 1 ML UNDER THE SKIN EVERY 14 DAYS, Disp: 6 mL, Rfl: 3   nitroGLYCERIN  (NITROSTAT ) 0.4 MG SL tablet, Place 1  tablet (0.4 mg total) under the tongue every 5 (five) minutes as needed for chest pain., Disp: 25 tablet, Rfl: 2   ondansetron  (ZOFRAN ) 4 MG tablet, Take 1 tablet (4 mg total) by mouth every 6 (six) hours. (Patient not taking: Reported on 07/09/2024), Disp: 12 tablet, Rfl: 0   ONETOUCH VERIO test strip, CHECK BLOOD SUGAR TID AS DIRECTED, Disp: , Rfl:  Allergies  Allergen Reactions   Actos [Pioglitazone] Other (See Comments)    REACTION: pt states INTOL w/ edema   Codeine  Other (See Comments)    REACTION: vomiting   Januvia [Sitagliptin] Nausea Only   Lactose Intolerance (Gi) Diarrhea   Morphine Nausea Only and Nausea And Vomiting    REACTION: vomiting REACTION: vomiting   Repatha  [Evolocumab ]     WAS NOT EFFECTIVE IN LOWERING LDL      Social History   Socioeconomic History   Marital status: Single    Spouse name: Not on file   Number of children: 1   Years of education: Not on file   Highest education level: Master's degree (e.g., MA, MS, MEng, MEd, MSW, MBA)  Occupational History   Occupation: Retired Runner, broadcasting/film/video   Tobacco Use   Smoking status: Never   Smokeless tobacco: Never   Tobacco comments:    Daily Caffeine - 1  Exercise 2-3 times/weekly  Vaping Use   Vaping status: Never Used  Substance and Sexual Activity   Alcohol use: No    Alcohol/week: 0.0 standard drinks of alcohol   Drug use: No   Sexual activity: Yes    Birth control/protection: None  Other Topics Concern   Not on file  Social History Narrative   Exercises 2-3 times weekly   Caffeine Use: 1 daily (tea or Pepsi)   Lives alone in a one story home.  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  Retired Midwife.     Social Drivers of Corporate investment banker Strain: Low Risk  (08/05/2023)   Overall Financial Resource Strain (CARDIA)    Difficulty of Paying Living Expenses: Not hard at all  Food Insecurity: No Food Insecurity (07/09/2024)   Hunger Vital Sign    Worried About Running Out of Food in the Last Year:  Never true    Ran Out of Food in the Last Year: Never true  Transportation Needs: No Transportation Needs (07/09/2024)   PRAPARE - Administrator, Civil Service (Medical): No    Lack of Transportation (Non-Medical): No  Physical Activity: Not on file  Stress: No Stress Concern Present (08/05/2023)   [employer] of Occupational Health - Occupational Stress Questionnaire    Feeling of Stress : Not at all  Social Connections: Not on file  Intimate Partner Violence: Not At Risk (07/09/2024)   Humiliation, Afraid, Rape, and Kick questionnaire    Fear of Current or Ex-Partner: No  Emotionally Abused: No    Physically Abused: No    Sexually Abused: No    Physical Exam      Future Appointments  Date Time Provider Department Center  07/20/2024  1:30 PM [facility] None  07/30/2024  1:45 PM Gregg Lek, MD [facility] None  09/18/2024  2:30 PM Geronimo Amel, MD [facility] [address]  01/08/2025  3:00 PM [facility] PHLEBOTOMIST [facility] None  01/08/2025  3:30 PM Pasam, Chinita, MD [facility] None       [name], Paramedic [phone] [facility] Paramedic  07/18/24

## 2024-07-19 ENCOUNTER — Other Ambulatory Visit (HOSPITAL_COMMUNITY): Payer: Self-pay

## 2024-07-19 DIAGNOSIS — L708 Other acne: Secondary | ICD-10-CM | POA: Diagnosis not present

## 2024-07-19 DIAGNOSIS — L501 Idiopathic urticaria: Secondary | ICD-10-CM | POA: Diagnosis not present

## 2024-07-20 ENCOUNTER — Ambulatory Visit

## 2024-07-20 VITALS — BP 142/83 | HR 60 | Temp 98.2°F | Resp 16 | Ht 61.0 in | Wt 153.2 lb

## 2024-07-20 DIAGNOSIS — D500 Iron deficiency anemia secondary to blood loss (chronic): Secondary | ICD-10-CM | POA: Diagnosis not present

## 2024-07-20 MED ORDER — SODIUM CHLORIDE 0.9 % IV SOLN
300.0000 mg | Freq: Once | INTRAVENOUS | Status: AC
  Administered 2024-07-20: 300 mg via INTRAVENOUS
  Filled 2024-07-20: qty 15

## 2024-07-20 NOTE — Patient Instructions (Signed)

## 2024-07-20 NOTE — Progress Notes (Signed)
 Diagnosis: Iron  Deficiency Anemia  Provider:  Praveen Mannam MD  Procedure: IV Infusion  IV Type: Peripheral, IV Location: L Forearm  Venofer  (Iron  Sucrose), Dose: 300 mg  Infusion Start Time: 1430  Infusion Stop Time: 1603  Post Infusion IV Care: Observation period completed and Peripheral IV Discontinued  Discharge: Condition: Good, Destination: Home . AVS Provided  Performed by:  Quetzal Meany, RN

## 2024-07-30 ENCOUNTER — Ambulatory Visit: Payer: Medicare PPO | Admitting: Neurology

## 2024-07-30 ENCOUNTER — Encounter: Payer: Self-pay | Admitting: Neurology

## 2024-07-30 VITALS — BP 120/78 | HR 99 | Ht 61.0 in | Wt 151.0 lb

## 2024-07-30 DIAGNOSIS — D329 Benign neoplasm of meninges, unspecified: Secondary | ICD-10-CM

## 2024-07-30 DIAGNOSIS — R569 Unspecified convulsions: Secondary | ICD-10-CM

## 2024-07-30 DIAGNOSIS — R4189 Other symptoms and signs involving cognitive functions and awareness: Secondary | ICD-10-CM | POA: Diagnosis not present

## 2024-07-30 NOTE — Progress Notes (Signed)
 [facility]  PATIENT: Jocelyn [name] DOB: January 27, 1956  REQUESTING CLINICIAN: Benjamine Aland, MD HISTORY FROM: Patient and daughter  REASON FOR VISIT: Meningioma/Seizure like activity    HISTORICAL  CHIEF COMPLAINT:  Chief Complaint  Patient presents with   Follow-up    Pt in 12 alone Pt here for seizure f/u Pt states no questions or concerns for today's visit    INTERVAL HISTORY 07/30/2024 Patient presents today for follow-up, she is alone.  Daughter was available over the phone.  Since last visit she has been doing well, denies any seizure or seizure like activity.  Denies any headaches, denies any new focal weakness or numbness.  Overall she is doing well, she reports completing cardiac workup and feels well physically.  No other complaints or concerns.   HISTORY OF PRESENT ILLNESS:  This is 68 year old woman past medical history of coronary artery disease, sarcoidosis, hypertension, diabetes mellitus type 2, who is presenting after being admitted to hospital in September for shortness of breath and found to have a massive PE.  Her hospitalization was complicated by PEA arrest requiring CPR and intubation.  While she was in the ICU, she was noted to have seizure-like activity described as eyes rolled back and stiffening.  At that time, she did have electrolytes derangement.  Her routine EEG was diffusely slow.  Patient was thought to have a provoked seizure, and was not started on antiseizure medication.  During her workup, she was also found to have a 6 mm meningioma.  She denies any previous history of seizures, denies any seizure risk factors.  Since discharge from the hospital, she continues to improve, she is doing physical therapy and currently has no other complaints.  She is eager to restart driving.  For the meningioma, she denies any headaches, denies any change in her vision, denies any focal neurological deficit.  OTHER MEDICAL CONDITIONS: oronary artery  disease, sarcoidosis, hypertension, diabetes mellitus type 2   REVIEW OF SYSTEMS: Full 14 system review of systems performed and negative with exception of: As noted in the HPI   ALLERGIES: Allergies  Allergen Reactions   Actos [Pioglitazone] Other (See Comments)    REACTION: pt states INTOL w/ edema   Codeine  Other (See Comments)    REACTION: vomiting   Januvia [Sitagliptin] Nausea Only   Lactose Intolerance (Gi) Diarrhea   Morphine Nausea Only and Nausea And Vomiting    REACTION: vomiting REACTION: vomiting   Repatha  [Evolocumab ]     WAS NOT EFFECTIVE IN LOWERING LDL    HOME MEDICATIONS: Outpatient Medications Prior to Visit  Medication Sig Dispense Refill   ALPRAZolam  (XANAX ) 0.5 MG tablet Take 0.5 mg by mouth 3 (three) times daily.     apixaban  (ELIQUIS ) 5 MG TABS tablet Take 1 tablet (5 mg total) by mouth 2 (two) times daily. 180 tablet 3   azaTHIOprine  (IMURAN ) 50 MG tablet Take 1 tablet by mouth daily.     Bempedoic Acid  (NEXLETOL ) 180 MG TABS Take 1 tablet (180 mg total) by mouth daily. 90 tablet 3   Empagliflozin -metFORMIN  HCl ER (SYNJARDY XR) 25-1000 MG TB24 Take 25-1,000 tablets by mouth daily.     GEMTESA 75 MG TABS Take 1 tablet by mouth daily.     icosapent  Ethyl (VASCEPA ) 1 g capsule TAKE 2 CAPSULES(2 GRAMS) BY MOUTH TWICE DAILY 360 capsule 2   metoprolol  succinate (TOPROL -XL) 100 MG 24 hr tablet Take 1 tablet (100 mg total) by mouth daily. Take with or immediately following a meal.  90 tablet 3   nitroGLYCERIN  (NITROSTAT ) 0.4 MG SL tablet Place 1 tablet (0.4 mg total) under the tongue every 5 (five) minutes as needed for chest pain. 25 tablet 2   ondansetron  (ZOFRAN ) 4 MG tablet Take 1 tablet (4 mg total) by mouth every 6 (six) hours. 12 tablet 0   ONETOUCH VERIO test strip CHECK BLOOD SUGAR TID AS DIRECTED     pantoprazole  (PROTONIX ) 40 MG tablet Take 1 tablet (40 mg total) by mouth daily. 30 tablet 0   PRALUENT  150 MG/ML SOAJ ADMINISTER 1 ML UNDER THE SKIN EVERY  14 DAYS 6 mL 3   No facility-administered medications prior to visit.    PAST MEDICAL HISTORY: Past Medical History:  Diagnosis Date   Acute bronchitis    Allergic rhinitis, cause unspecified    Anemia    Anxiety    B12 deficiency    BV (bacterial vaginosis) 06/22/1996   Calculus of kidney    Calculus of kidney    Coronary atherosclerosis of unspecified type of vessel, native or graft    Dizziness    Essential hypertension, benign    Fibroid 2003   Fibromyalgia    H/O dysmenorrhea 2008   H/O varicella    Headache(784.0)    frequently   HSV-2 infection 2009   Hyperplastic colon polyp 05/16/2014   Irritable bowel syndrome    Meniere's disease, unspecified    Menses, irregular 2003   Myalgia and myositis, unspecified    Myocardial infarction (HCC)    Obstructive sleep apnea (adult) (pediatric)    Perimenopausal symptoms 2003   Pure hypercholesterolemia    Sarcoidosis    Type II or unspecified type diabetes mellitus without mention of complication, not stated as uncontrolled    Unspecified venous (peripheral) insufficiency    Vitamin D  deficiency    Vulvitis 2010   Yeast infection     PAST SURGICAL HISTORY: Past Surgical History:  Procedure Laterality Date   COLONOSCOPY     CORONARY STENT INTERVENTION N/A 06/15/2017   Procedure: CORONARY STENT INTERVENTION;  Surgeon: Dann Candyce RAMAN, MD;  Location: [facility];  Service: Cardiovascular;  Laterality: N/A;   heart catherization     IR ANGIOGRAM PULMONARY BILATERAL SELECTIVE  08/04/2023   IR ANGIOGRAM SELECTIVE EACH ADDITIONAL VESSEL  08/04/2023   IR ANGIOGRAM SELECTIVE EACH ADDITIONAL VESSEL  08/04/2023   IR THROMBECT PRIM MECH INIT (INCLU) MOD SED  08/04/2023   IR US  GUIDE VASC ACCESS RIGHT  08/04/2023   LEFT HEART CATH AND CORONARY ANGIOGRAPHY N/A 06/15/2017   Procedure: LEFT HEART CATH AND CORONARY ANGIOGRAPHY;  Surgeon: Dann Candyce RAMAN, MD;  Location: [facility];  Service: Cardiovascular;   Laterality: N/A;   RADIOLOGY WITH ANESTHESIA N/A 08/04/2023   Procedure: IR WITH ANESTHESIA;  Surgeon: Dolphus Carrion, MD;  Location: [facility] OR;  Service: Radiology;  Laterality: N/A;   TONSILLECTOMY      FAMILY HISTORY: Family History  Adopted: Yes  Problem Relation Age of Onset   Other Other        ADOPTED   Colon cancer Neg Hx    Esophageal cancer Neg Hx    Rectal cancer Neg Hx    Stomach cancer Neg Hx    Emphysema Neg Hx    Seizures Neg Hx     SOCIAL HISTORY: Social History   Socioeconomic History   Marital status: Single    Spouse name: Not on file   Number of children: 1   Years of education:  Not on file   Highest education level: Master's degree (e.g., MA, MS, MEng, MEd, MSW, MBA)  Occupational History   Occupation: Retired Runner, broadcasting/film/video   Tobacco Use   Smoking status: Never   Smokeless tobacco: Never   Tobacco comments:    Daily Caffeine - 1  Exercise 2-3 times/weekly  Vaping Use   Vaping status: Never Used  Substance and Sexual Activity   Alcohol use: No    Alcohol/week: 0.0 standard drinks of alcohol   Drug use: No   Sexual activity: Yes    Birth control/protection: None  Other Topics Concern   Not on file  Social History Narrative   Exercises 2-3 times weekly   Caffeine Use: 1 daily (tea or Pepsi)   Lives alone in a one story home.  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  Retired Midwife.     Social Drivers of Corporate investment banker Strain: Low Risk  (08/05/2023)   Overall Financial Resource Strain (CARDIA)    Difficulty of Paying Living Expenses: Not hard at all  Food Insecurity: No Food Insecurity (07/09/2024)   Hunger Vital Sign    Worried About Running Out of Food in the Last Year: Never true    Ran Out of Food in the Last Year: Never true  Transportation Needs: No Transportation Needs (07/09/2024)   PRAPARE - Administrator, Civil Service (Medical): No    Lack of Transportation (Non-Medical): No  Physical Activity: Not on file  Stress: No  Stress Concern Present (08/05/2023)   [employer] of Occupational Health - Occupational Stress Questionnaire    Feeling of Stress : Not at all  Social Connections: Not on file  Intimate Partner Violence: Not At Risk (07/09/2024)   Humiliation, Afraid, Rape, and Kick questionnaire    Fear of Current or Ex-Partner: No    Emotionally Abused: No    Physically Abused: No    Sexually Abused: No    PHYSICAL EXAM   GENERAL EXAM/CONSTITUTIONAL: Vitals:  Vitals:   07/30/24 1351  BP: 120/78  Pulse: 99  Weight: 151 lb (68.5 kg)  Height: 5' 1 (1.549 m)   Body mass index is 28.53 kg/m. Wt Readings from Last 3 Encounters:  07/30/24 151 lb (68.5 kg)  07/20/24 153 lb 3.2 oz (69.5 kg)  07/09/24 152 lb 9.6 oz (69.2 kg)   Patient is in no distress; well developed, nourished and groomed; neck is supple  MUSCULOSKELETAL: Gait, strength, tone, movements noted in Neurologic exam below  NEUROLOGIC: MENTAL STATUS:      No data to display         awake, alert, oriented to person, place and time recent and remote memory intact normal attention and concentration language fluent, comprehension intact, naming intact fund of knowledge appropriate  CRANIAL NERVE:  2nd, 3rd, 4th, 6th - Visual fields full to confrontation, extraocular muscles intact, no nystagmus 5th - facial sensation symmetric 7th - facial strength symmetric 8th - hearing intact 9th - palate elevates symmetrically, uvula midline 11th - shoulder shrug symmetric 12th - tongue protrusion midline  MOTOR:  normal bulk and tone, full strength in the BUE, BLE  SENSORY:  normal and symmetric to light touch  COORDINATION:  finger-nose-finger, fine finger movements normal  GAIT/STATION:  normal   DIAGNOSTIC DATA (LABS, IMAGING, TESTING) - I reviewed patient records, labs, notes, testing and imaging myself where available.  Lab Results  Component Value Date   WBC 4.3 07/09/2024   HGB 12.2 07/09/2024   HCT 37.1  07/09/2024   MCV 97.1 07/09/2024   PLT 254 07/09/2024      Component Value Date/Time   NA 137 07/09/2024 1512   NA 146 (H) 11/06/2019 1519   K 4.4 07/09/2024 1512   CL 106 07/09/2024 1512   CO2 20 (L) 07/09/2024 1512   GLUCOSE 94 07/09/2024 1512   BUN 20 07/09/2024 1512   BUN 13 11/06/2019 1519   CREATININE 1.37 (H) 07/09/2024 1512   CREATININE 0.83 06/01/2016 1254   CALCIUM  10.1 07/09/2024 1512   PROT 7.0 07/09/2024 1512   PROT 7.0 04/16/2020 1505   ALBUMIN 4.2 07/09/2024 1512   ALBUMIN 4.4 04/16/2020 1505   AST 24 07/09/2024 1512   ALT 12 07/09/2024 1512   ALKPHOS 75 07/09/2024 1512   BILITOT 0.4 07/09/2024 1512   GFRNONAA 42 (L) 07/09/2024 1512   GFRAA 105 11/06/2019 1519   Lab Results  Component Value Date   CHOL 133 06/06/2024   HDL 35 (L) 06/06/2024   LDLCALC 53 06/06/2024   LDLDIRECT 137 (H) 12/16/2022   TRIG 224 (H) 06/06/2024   CHOLHDL 3.8 06/06/2024   Lab Results  Component Value Date   HGBA1C 6.2 (H) 08/03/2023   Lab Results  Component Value Date   VITAMINB12 308 07/09/2024   Lab Results  Component Value Date   TSH 0.90 08/16/2018    MRI Brain 08/06/2023 1. No acute finding or correlate for seizure history. 2. Mild chronic white matter disease. 3. 6 mm meningioma along the left frontal convexity.   EEG 08/04/2023 Continuous slow, generalized   ASSESSMENT AND PLAN  67 y.o. year old female with coronary artery disease, sarcoidosis, hypertension, diabetes mellitus type 2, who is presenting for follow-up.  No seizure or seizure like activity since last visit.  Again explained to the patient this was likely a provoked seizure.   In terms of the meningioma, she denies any complaint of headaches, no focal weakness, no speech change, and no visual changes.  Informed patient that the size of the meningioma is small and I will not repeat the MRI at the moment.  Advised her to continue following up with PCP and return if her symptoms do get worse.   1.  Meningioma (HCC)   2. Seizure-like activity (HCC)   3. Subjective memory complaints      Patient Instructions  Continue current medications  Continue to follow up PCP  Return as needed   No orders of the defined types were placed in this encounter.   No orders of the defined types were placed in this encounter.   Return if symptoms worsen or fail to improve.  I have spent a total of 30 minutes dedicated to this patient today, preparing to see patient, performing a medically appropriate examination and evaluation, ordering tests and/or medications and procedures, and counseling and educating the patient/family/caregiver; independently interpreting result and communicating results to the family/patient/caregiver; and documenting clinical information in the electronic medical record.   Pastor Falling, MD 07/30/2024, 5:35 PM  [facility] [address] [place], [place] [address] [phone]

## 2024-07-30 NOTE — Patient Instructions (Addendum)
 Continue current medications  Continue to follow up PCP  Return as needed

## 2024-08-01 ENCOUNTER — Other Ambulatory Visit (HOSPITAL_COMMUNITY): Payer: Self-pay

## 2024-08-02 NOTE — Progress Notes (Signed)
 Paramedicine Encounter    Patient ID: Jocelyn [name], female    DOB: [birth year], 68 y.o.   MRN: [id number]   Complaints-denies  Edema-none  Compliance with meds-yes  Pill box filled-n/a  If so, by whom-herself  Refills needed-none     Pt reports she is doing ok. She denies c/p, no increased sob, no edema noted, no dizziness.  Taking meds.  Continuing the iron  infusions.  Pt denies any issues or concerns presently.   She does her own pill box and is doing well.   BP 128/72   Pulse 70   Resp 15   Wt 150 lb (68 kg)   SpO2 98%   BMI 28.34 kg/m  Weight yesterday-150 Last visit weight-149  Patient Care Team: Benjamine Aland, MD as PCP - General (Family Medicine) Verlin Lonni BIRCH, MD as PCP - Cardiology (Cardiology) Glena Harlene HERO, FNP (Cardiology)  Patient Active Problem List   Diagnosis Date Noted   Macrocytosis 07/09/2024   Physical deconditioning 08/23/2023   Acute respiratory failure with hypoxia (HCC) 08/05/2023   Hx of acute saddle pulmonary embolism in September 2024 08/03/2023   Pulmonary edema 08/03/2023   Degenerative arthritis of knee 08/20/2021   Hx of pleural effusion    Dyspnea 08/16/2018   Fall on or from sidewalk curb, initial encounter 05/31/2018   History of acute inferior wall MI 08/22/2017   Diabetes mellitus (HCC)    S/P right coronary artery (RCA) stent placement    Overweight 05/05/2017   Diabetes mellitus type 2, controlled, without complications (HCC) 12/16/2016   Asthmatic bronchitis , chronic (HCC) 05/10/2016   Polymyositis (HCC) 09/15/2015   History of sarcoidosis 09/15/2015   Falling 03/13/2015   Edema 02/13/2014   Coronary artery disease involving native heart without angina pectoris 10/29/2013   Hyperlipidemia 10/29/2013   Iron  (Fe) deficiency anemia 08/08/2013   Vitamin B 12 deficiency 08/08/2013   GERD (gastroesophageal reflux disease) 09/08/2011   Weakness 02/22/2011   Depression 02/22/2011   MENIERE'S DISEASE  04/30/2009   Vitamin D  deficiency 02/05/2009   Venous (peripheral) insufficiency 12/12/2007   RENAL CALCULUS 12/12/2007   DIZZINESS 12/12/2007   HYPERCHOLESTEROLEMIA 09/16/2007   Anxiety state 09/16/2007   HYPERTENSION, BENIGN 09/16/2007   Allergic rhinitis 09/16/2007   IRRITABLE BOWEL SYNDROME 09/16/2007   Myalgia and myositis 09/16/2007    Current Outpatient Medications:    ALPRAZolam  (XANAX ) 0.5 MG tablet, Take 0.5 mg by mouth 3 (three) times daily., Disp: , Rfl:    apixaban  (ELIQUIS ) 5 MG TABS tablet, Take 1 tablet (5 mg total) by mouth 2 (two) times daily., Disp: 180 tablet, Rfl: 3   azaTHIOprine  (IMURAN ) 50 MG tablet, Take 1 tablet by mouth daily., Disp: , Rfl:    Bempedoic Acid  (NEXLETOL ) 180 MG TABS, Take 1 tablet (180 mg total) by mouth daily., Disp: 90 tablet, Rfl: 3   Empagliflozin -metFORMIN  HCl ER (SYNJARDY XR) 25-1000 MG TB24, Take 25-1,000 tablets by mouth daily., Disp: , Rfl:    GEMTESA 75 MG TABS, Take 1 tablet by mouth daily., Disp: , Rfl:    icosapent  Ethyl (VASCEPA ) 1 g capsule, TAKE 2 CAPSULES(2 GRAMS) BY MOUTH TWICE DAILY, Disp: 360 capsule, Rfl: 2   metoprolol  succinate (TOPROL -XL) 100 MG 24 hr tablet, Take 1 tablet (100 mg total) by mouth daily. Take with or immediately following a meal., Disp: 90 tablet, Rfl: 3   nitroGLYCERIN  (NITROSTAT ) 0.4 MG SL tablet, Place 1 tablet (0.4 mg total) under the tongue every 5 (five) minutes as needed for  chest pain., Disp: 25 tablet, Rfl: 2   ondansetron  (ZOFRAN ) 4 MG tablet, Take 1 tablet (4 mg total) by mouth every 6 (six) hours., Disp: 12 tablet, Rfl: 0   ONETOUCH VERIO test strip, CHECK BLOOD SUGAR TID AS DIRECTED, Disp: , Rfl:    pantoprazole  (PROTONIX ) 40 MG tablet, Take 1 tablet (40 mg total) by mouth daily., Disp: 30 tablet, Rfl: 0   PRALUENT  150 MG/ML SOAJ, ADMINISTER 1 ML UNDER THE SKIN EVERY 14 DAYS, Disp: 6 mL, Rfl: 3 Allergies  Allergen Reactions   Actos [Pioglitazone] Other (See Comments)    REACTION: pt states  INTOL w/ edema   Codeine  Other (See Comments)    REACTION: vomiting   Januvia [Sitagliptin] Nausea Only   Lactose Intolerance (Gi) Diarrhea   Morphine Nausea Only and Nausea And Vomiting    REACTION: vomiting REACTION: vomiting   Repatha  [Evolocumab ]     WAS NOT EFFECTIVE IN LOWERING LDL      Social History   Socioeconomic History   Marital status: Single    Spouse name: Not on file   Number of children: 1   Years of education: Not on file   Highest education level: Master's degree (e.g., MA, MS, MEng, MEd, MSW, MBA)  Occupational History   Occupation: Retired Runner, broadcasting/film/video   Tobacco Use   Smoking status: Never   Smokeless tobacco: Never   Tobacco comments:    Daily Caffeine - 1  Exercise 2-3 times/weekly  Vaping Use   Vaping status: Never Used  Substance and Sexual Activity   Alcohol use: No    Alcohol/week: 0.0 standard drinks of alcohol   Drug use: No   Sexual activity: Yes    Birth control/protection: None  Other Topics Concern   Not on file  Social History Narrative   Exercises 2-3 times weekly   Caffeine Use: 1 daily (tea or Pepsi)   Lives alone in a one story home.  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  Retired Midwife.     Social Drivers of Corporate investment banker Strain: Low Risk  (08/05/2023)   Overall Financial Resource Strain (CARDIA)    Difficulty of Paying Living Expenses: Not hard at all  Food Insecurity: No Food Insecurity (07/09/2024)   Hunger Vital Sign    Worried About Running Out of Food in the Last Year: Never true    Ran Out of Food in the Last Year: Never true  Transportation Needs: No Transportation Needs (07/09/2024)   PRAPARE - Administrator, Civil Service (Medical): No    Lack of Transportation (Non-Medical): No  Physical Activity: Not on file  Stress: No Stress Concern Present (08/05/2023)   [employer] of Occupational Health - Occupational Stress Questionnaire    Feeling of Stress : Not at all  Social Connections: Not on  file  Intimate Partner Violence: Not At Risk (07/09/2024)   Humiliation, Afraid, Rape, and Kick questionnaire    Fear of Current or Ex-Partner: No    Emotionally Abused: No    Physically Abused: No    Sexually Abused: No    Physical Exam      Future Appointments  Date Time Provider Department Center  08/03/2024  1:30 PM [facility] None  08/17/2024  1:30 PM [facility] None  09/18/2024  2:30 PM Geronimo Amel, MD [facility] [address]  01/08/2025  3:00 PM [facility] PHLEBOTOMIST [facility] None  01/08/2025  3:30 PM Pasam, Chinita, MD [facility] None  [name], Paramedic [phone] 08/01/2024

## 2024-08-03 ENCOUNTER — Ambulatory Visit

## 2024-08-03 VITALS — BP 136/81 | HR 61 | Temp 97.9°F | Resp 18 | Ht 61.5 in | Wt 153.2 lb

## 2024-08-03 DIAGNOSIS — D509 Iron deficiency anemia, unspecified: Secondary | ICD-10-CM | POA: Diagnosis not present

## 2024-08-03 DIAGNOSIS — D500 Iron deficiency anemia secondary to blood loss (chronic): Secondary | ICD-10-CM

## 2024-08-03 MED ORDER — SODIUM CHLORIDE 0.9 % IV SOLN
300.0000 mg | Freq: Once | INTRAVENOUS | Status: AC
  Administered 2024-08-03: 300 mg via INTRAVENOUS
  Filled 2024-08-03: qty 15

## 2024-08-03 NOTE — Progress Notes (Signed)
 Diagnosis: Iron  Deficiency Anemia  Provider:  Praveen Mannam MD  Procedure: IV Infusion  IV Type: Peripheral, IV Location: R Forearm  Venofer  (Iron  Sucrose), Dose: 300 mg  Infusion Start Time: 1428  Infusion Stop Time: 1614  Post Infusion IV Care: Patient declined observation and Peripheral IV Discontinued  Discharge: Condition: Good, Destination: Home . AVS Provided  Performed by:  Linnae Rasool, RN

## 2024-08-12 DIAGNOSIS — I2699 Other pulmonary embolism without acute cor pulmonale: Secondary | ICD-10-CM | POA: Diagnosis not present

## 2024-08-15 ENCOUNTER — Other Ambulatory Visit (HOSPITAL_COMMUNITY): Payer: Self-pay | Admitting: Cardiology

## 2024-08-15 MED ORDER — METOPROLOL SUCCINATE ER 100 MG PO TB24: 100.0000 mg | ORAL_TABLET | Freq: Every day | ORAL | 3 refills | Status: AC

## 2024-08-17 ENCOUNTER — Ambulatory Visit (INDEPENDENT_AMBULATORY_CARE_PROVIDER_SITE_OTHER)

## 2024-08-17 VITALS — BP 145/81 | HR 58 | Temp 97.7°F | Resp 16 | Ht 61.5 in | Wt 154.8 lb

## 2024-08-17 DIAGNOSIS — D500 Iron deficiency anemia secondary to blood loss (chronic): Secondary | ICD-10-CM

## 2024-08-17 DIAGNOSIS — D509 Iron deficiency anemia, unspecified: Secondary | ICD-10-CM

## 2024-08-17 MED ORDER — SODIUM CHLORIDE 0.9 % IV SOLN
300.0000 mg | Freq: Once | INTRAVENOUS | Status: AC
  Administered 2024-08-17: 300 mg via INTRAVENOUS
  Filled 2024-08-17: qty 15

## 2024-08-17 NOTE — Progress Notes (Signed)
 Diagnosis: Iron  Deficiency Anemia  Provider:  Praveen Mannam MD  Procedure: IV Infusion  IV Type: Peripheral, IV Location: L Forearm  Venofer  (Iron  Sucrose), Dose: 300 mg  Infusion Start Time: 1415  Infusion Stop Time: 1555  Post Infusion IV Care: Observation period completed and Peripheral IV Discontinued  Discharge: Condition: Good, Destination: Home . AVS Provided  Performed by:  Leita FORBES Miles, LPN

## 2024-08-22 ENCOUNTER — Other Ambulatory Visit (HOSPITAL_COMMUNITY): Payer: Self-pay

## 2024-08-22 NOTE — Progress Notes (Signed)
 Paramedicine Encounter    Patient ID: Jocelyn [name], female    DOB: Jun 20, 1956, 68 y.o.   MRN: [id number]   Complaints-denies   Edema-denies  Compliance with meds-yes   Pill box filled-yes If so, by whom-patient   Refills needed-n/a   Pt reports she feels ok. She denies any issues or complaints at this time.  She had issues with metoprolol  getting refilled from pharmacy but its gotten handled.  No other issues with other meds.  She denies increased sob, no dizziness or c/p. No palpitations.  Weight stable no edema noted.  Will f/u in a few wks.     BP 130/80   Pulse 65   Resp 15   SpO2 97%  Weight yesterday-151 Last visit weight-150  Patient Care Team: Benjamine Aland, MD as PCP - General (Family Medicine) Verlin Lonni BIRCH, MD as PCP - Cardiology (Cardiology) Glena Harlene HERO, FNP (Cardiology)  Patient Active Problem List   Diagnosis Date Noted  . Macrocytosis 07/09/2024  . Physical deconditioning 08/23/2023  . Acute respiratory failure with hypoxia (HCC) 08/05/2023  . Hx of acute saddle pulmonary embolism in September 2024 08/03/2023  . Pulmonary edema 08/03/2023  . Degenerative arthritis of knee 08/20/2021  . Hx of pleural effusion   . Dyspnea 08/16/2018  . Fall on or from sidewalk curb, initial encounter 05/31/2018  . History of acute inferior wall MI 08/22/2017  . Diabetes mellitus (HCC)   . S/P right coronary artery (RCA) stent placement   . Overweight 05/05/2017  . Diabetes mellitus type 2, controlled, without complications (HCC) 12/16/2016  . Asthmatic bronchitis , chronic (HCC) 05/10/2016  . Polymyositis (HCC) 09/15/2015  . History of sarcoidosis 09/15/2015  . Falling 03/13/2015  . Edema 02/13/2014  . Coronary artery disease involving native heart without angina pectoris 10/29/2013  . Hyperlipidemia 10/29/2013  . Iron  (Fe) deficiency anemia 08/08/2013  . Vitamin B 12 deficiency 08/08/2013  . GERD (gastroesophageal reflux disease) 09/08/2011   . Weakness 02/22/2011  . Depression 02/22/2011  . MENIERE'S DISEASE 04/30/2009  . Vitamin D  deficiency 02/05/2009  . Venous (peripheral) insufficiency 12/12/2007  . RENAL CALCULUS 12/12/2007  . DIZZINESS 12/12/2007  . HYPERCHOLESTEROLEMIA 09/16/2007  . Anxiety state 09/16/2007  . HYPERTENSION, BENIGN 09/16/2007  . Allergic rhinitis 09/16/2007  . IRRITABLE BOWEL SYNDROME 09/16/2007  . Myalgia and myositis 09/16/2007    Current Outpatient Medications:  .  ALPRAZolam  (XANAX ) 0.5 MG tablet, Take 0.5 mg by mouth 3 (three) times daily., Disp: , Rfl:  .  apixaban  (ELIQUIS ) 5 MG TABS tablet, Take 1 tablet (5 mg total) by mouth 2 (two) times daily., Disp: 180 tablet, Rfl: 3 .  azaTHIOprine  (IMURAN ) 50 MG tablet, Take 1 tablet by mouth daily., Disp: , Rfl:  .  Bempedoic Acid  (NEXLETOL ) 180 MG TABS, Take 1 tablet (180 mg total) by mouth daily., Disp: 90 tablet, Rfl: 3 .  Empagliflozin -metFORMIN  HCl ER (SYNJARDY XR) 25-1000 MG TB24, Take 25-1,000 tablets by mouth daily., Disp: , Rfl:  .  GEMTESA 75 MG TABS, Take 1 tablet by mouth daily., Disp: , Rfl:  .  icosapent  Ethyl (VASCEPA ) 1 g capsule, TAKE 2 CAPSULES(2 GRAMS) BY MOUTH TWICE DAILY, Disp: 360 capsule, Rfl: 2 .  metoprolol  succinate (TOPROL -XL) 100 MG 24 hr tablet, Take 1 tablet (100 mg total) by mouth daily. Take with or immediately following a meal., Disp: 90 tablet, Rfl: 3 .  nitroGLYCERIN  (NITROSTAT ) 0.4 MG SL tablet, Place 1 tablet (0.4 mg total) under the tongue every 5 (five)  minutes as needed for chest pain., Disp: 25 tablet, Rfl: 2 .  ondansetron  (ZOFRAN ) 4 MG tablet, Take 1 tablet (4 mg total) by mouth every 6 (six) hours., Disp: 12 tablet, Rfl: 0 .  ONETOUCH VERIO test strip, CHECK BLOOD SUGAR TID AS DIRECTED, Disp: , Rfl:  .  pantoprazole  (PROTONIX ) 40 MG tablet, Take 1 tablet (40 mg total) by mouth daily., Disp: 30 tablet, Rfl: 0 .  PRALUENT  150 MG/ML SOAJ, ADMINISTER 1 ML UNDER THE SKIN EVERY 14 DAYS, Disp: 6 mL, Rfl:  3 Allergies  Allergen Reactions  . Actos [Pioglitazone] Other (See Comments)    REACTION: pt states INTOL w/ edema  . Codeine  Other (See Comments)    REACTION: vomiting  . Januvia [Sitagliptin] Nausea Only  . Lactose Intolerance (Gi) Diarrhea  . Morphine Nausea Only and Nausea And Vomiting    REACTION: vomiting REACTION: vomiting  . Repatha  [Evolocumab ]     WAS NOT EFFECTIVE IN LOWERING LDL      Social History   Socioeconomic History  . Marital status: Single    Spouse name: Not on file  . Number of children: 1  . Years of education: Not on file  . Highest education level: Master's degree (e.g., MA, MS, MEng, MEd, MSW, MBA)  Occupational History  . Occupation: Retired Runner, broadcasting/film/video   Tobacco Use  . Smoking status: Never  . Smokeless tobacco: Never  . Tobacco comments:    Daily Caffeine - 1  Exercise 2-3 times/weekly  Vaping Use  . Vaping status: Never Used  Substance and Sexual Activity  . Alcohol use: No    Alcohol/week: 0.0 standard drinks of alcohol  . Drug use: No  . Sexual activity: Yes    Birth control/protection: None  Other Topics Concern  . Not on file  Social History Narrative   Exercises 2-3 times weekly   Caffeine Use: 1 daily (tea or Pepsi)   Lives alone in a one story home.  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  Retired Midwife.     Social Drivers of Corporate investment banker Strain: Low Risk  (08/05/2023)   Overall Financial Resource Strain (CARDIA)   . Difficulty of Paying Living Expenses: Not hard at all  Food Insecurity: No Food Insecurity (07/09/2024)   Hunger Vital Sign   . Worried About Programme researcher, broadcasting/film/video in the Last Year: Never true   . Ran Out of Food in the Last Year: Never true  Transportation Needs: No Transportation Needs (07/09/2024)   PRAPARE - Transportation   . Lack of Transportation (Medical): No   . Lack of Transportation (Non-Medical): No  Physical Activity: Not on file  Stress: No Stress Concern Present (08/05/2023)   [employer] of Occupational Health - Occupational Stress Questionnaire   . Feeling of Stress : Not at all  Social Connections: Not on file  Intimate Partner Violence: Not At Risk (07/09/2024)   Humiliation, Afraid, Rape, and Kick questionnaire   . Fear of Current or Ex-Partner: No   . Emotionally Abused: No   . Physically Abused: No   . Sexually Abused: No    Physical Exam      Future Appointments  Date Time Provider Department Center  09/18/2024  2:30 PM Geronimo Amel, MD [facility] [address]  01/08/2025  3:00 PM [facility] PHLEBOTOMIST [facility] None  01/08/2025  3:30 PM Pasam, Chinita, MD [facility] None       [name], Paramedic [phone] [facility] Paramedic  08/22/24

## 2024-08-24 ENCOUNTER — Other Ambulatory Visit (HOSPITAL_COMMUNITY): Payer: Self-pay

## 2024-08-27 ENCOUNTER — Other Ambulatory Visit (HOSPITAL_COMMUNITY): Payer: Self-pay

## 2024-08-27 MED ORDER — NITROGLYCERIN 0.4 MG SL SUBL
0.4000 mg | SUBLINGUAL_TABLET | SUBLINGUAL | 2 refills | Status: AC | PRN
  Filled 2024-08-27: qty 75, 75d supply, fill #0

## 2024-08-30 ENCOUNTER — Other Ambulatory Visit: Payer: Self-pay | Admitting: Cardiovascular Disease

## 2024-08-30 DIAGNOSIS — L309 Dermatitis, unspecified: Secondary | ICD-10-CM | POA: Diagnosis not present

## 2024-08-30 DIAGNOSIS — Z955 Presence of coronary angioplasty implant and graft: Secondary | ICD-10-CM

## 2024-08-30 DIAGNOSIS — L501 Idiopathic urticaria: Secondary | ICD-10-CM | POA: Diagnosis not present

## 2024-08-30 DIAGNOSIS — I2510 Atherosclerotic heart disease of native coronary artery without angina pectoris: Secondary | ICD-10-CM

## 2024-08-30 DIAGNOSIS — E782 Mixed hyperlipidemia: Secondary | ICD-10-CM

## 2024-09-11 DIAGNOSIS — I2699 Other pulmonary embolism without acute cor pulmonale: Secondary | ICD-10-CM | POA: Diagnosis not present

## 2024-09-18 ENCOUNTER — Ambulatory Visit: Admitting: Internal Medicine

## 2024-09-18 ENCOUNTER — Encounter: Payer: Self-pay | Admitting: Internal Medicine

## 2024-09-18 VITALS — BP 112/68 | HR 63 | Temp 98.5°F | Ht 61.5 in | Wt 151.6 lb

## 2024-09-18 DIAGNOSIS — R5383 Other fatigue: Secondary | ICD-10-CM | POA: Diagnosis not present

## 2024-09-18 DIAGNOSIS — J9611 Chronic respiratory failure with hypoxia: Secondary | ICD-10-CM

## 2024-09-18 DIAGNOSIS — M3320 Polymyositis, organ involvement unspecified: Secondary | ICD-10-CM | POA: Diagnosis not present

## 2024-09-18 DIAGNOSIS — R0609 Other forms of dyspnea: Secondary | ICD-10-CM

## 2024-09-18 DIAGNOSIS — Z7185 Encounter for immunization safety counseling: Secondary | ICD-10-CM

## 2024-09-18 DIAGNOSIS — J329 Chronic sinusitis, unspecified: Secondary | ICD-10-CM | POA: Diagnosis not present

## 2024-09-18 DIAGNOSIS — I2699 Other pulmonary embolism without acute cor pulmonale: Secondary | ICD-10-CM

## 2024-09-18 DIAGNOSIS — Z86711 Personal history of pulmonary embolism: Secondary | ICD-10-CM

## 2024-09-18 MED ORDER — AZELASTINE HCL 0.1 % NA SOLN: 2.0000 | Freq: Two times a day (BID) | NASAL | 12 refills | Status: AC

## 2024-09-18 NOTE — Patient Instructions (Addendum)
 ICD-10-CM   1. DOE (dyspnea on exertion)  R06.09     2. Other fatigue  R53.83     3. Polymyositis (HCC)  M33.20     4. History of pulmonary embolism  Z86.711     5. Chronic sinusitis, unspecified location  J32.9     6. Vaccine counseling  Z71.85        -History of massive pulmonary embolism September 2024 with cardiopulmonary arrest.  Glad you are better right now and you are on Eliquis  life long without any bleeding episodes  - You are having residual shortness of breath and fatigue which is similar to preillness.  -You are also reporting chronic sinus congestion  -Noted that you have your flu shot with your primary care doctor.  Plan - Continue Eliquis ; lifelong according to hematology - Continue polymyositis treatment - Start Astelin nasal spray for your nasal congestion - Do echocardiogram in 6 months; - do high-resolution CT chest in 6 months - Avoid respiratory illness sick exposure and control your risk for respiratory infection  - be uptodate with all respiratory vaccines  - avoid sick contacts especially in areas of indoor clusters (churches, weddings, funerals, family gatherings, birthdays, planes, malls, indoor areas especially)   - mask in these areas   - discourage sick people from coming in close contact with you   Follow-up - 6 months or sooner if needed; with nurse practitioner if evaluation is normal at follow-up then you can be discharged

## 2024-09-18 NOTE — Progress Notes (Addendum)
 Previous [facility] pulmonary encounter: 06/13/19- Dr. Kassie     Ms. Jocelyn [name] is a 68 year old female with chronic bronchitis, hx of sarcoid diagnosed via bronchoscopy in 1980s with recurrence, OSA, HTN, CAD s/p DES to RCA in 2019, chronic diastolic heart failure and polymyositis previously on methotrexate and anxiety who presents for follow-up of shortness of breath.   Former Dr. Christi patient: Followed for annual episodes of acute bronchitis treated with antibiotics, inhalers and steroids. She has chronic dyspnea on exertion and is sedentary at baseline. Of note, during hospitalization 08/2019 found with left paramediastinal mass. Repeat imaging demonstrated complete resolution after initiating bronchodilator therapy so this abnormality likely represented atelectasis.   Since our last visit in 12/2018, her bronchodilator was changed to Advair. She does not feel any different, no worse or better. She reports her shortness of breath is minimal but states she limits her activity so not sure if she is doing well. Also ordered sleep study however not completed due to pandemic. Has not had to use her albuterol . She continues to report excessive daytime sleepiness and fatigue. Denies falling asleep when talking to people however will sleep easily if she is left alone and/or laying down. Her sleep hours are irregular and does not have a routine bed ritual. Considering returning to her part time job for limited hours at [employer].    Epworth: 9  04/22/2021 68 year old female, never smoked. PMH significant for HTN, CAD, mild OSA, Covid-19, asthmatic bronchitis, allergic rhinitis, GERD, IBS, type 2 DM. Patient of Dr. Kassie, last seen on 06/13/19.  Patient tested positive for covid on 6/3. She had symptoms of chills, HA, chest congested, dry cough and diarrhea started on Thursday. Sent in paxlovid .  No significant respiratory complaints. Cough and sore throat have improved. Very mild dyspnea when she  exerts herself. Diarrhea has slowed down some. She is having 4 loose stools a day, previously she was having diarrhea every hour. She is taking imodium as needed. She went to [facility] today for covid testing. She was told her heart rate was 51. She is having no vision changes or lightheadedness.   Mild sleep apnea: - She never had HST that was ordered back in 2020 d/t insurance coverage/cost  - Denies overt apnea symptoms     09/21/2023 Patient presents today for overdue follow-up. She is followed by our office for history of chronic bronchitis and mild OSA. Last seen in 2022 for Covid infection. Did not have repeat HST due to insurance coverage.     The patient, with a history of chronic bronchitis, mild sleep apnea, and polymyositis, presents for a follow-up visit after a recent hospitalization due to a pulmonary embolism. The patient experienced a cardiac arrest during the hospital stay and was successfully resuscitated. The patient reports confusion about the events surrounding the hospitalization and the subsequent follow-up care.  The patient's sleep apnea symptoms are reportedly minimal, with the patient waking up to use the bathroom at night but able to fall back asleep. The patient denies snoring or waking up gasping or choking. The patient is not currently interested in re-evaluating her sleep apnea with another sleep study.  Regarding the patient's chronic bronchitis, the patient was trialed off of the inhaler, Advair, about four years ago. The patient reports shortness of breath with exertion, which has been a persistent symptom since the hospitalization. The patient denies any bleeding while on Eliquis , a blood thinner prescribed after the pulmonary embolism.  The patient also  reports voice hoarseness since being intubated during the hospital stay. The patient's voice quality varies, sometimes being louder than other times. The patient has not seen an ear, nose, and throat doctor for  this issue.  The patient is also on Imuran  for polymyositis and is followed by a rheumatologist for this condition. The patient reports no changes in this condition.    OV 10/04/2023  Subjective:  Patient ID: Jocelyn [name], female , DOB: [birth year] , age 68 y.o. , MRN: [id number] , ADDRESS: [address] [place] [address] PCP Jocelyn Aland, MD Patient Care Team: Jocelyn Aland, MD as PCP - General (Family Medicine) Jocelyn Candyce RAMAN, MD as PCP - Cardiology (Cardiology)  This Provider for this visit: Treatment Team:  Attending Provider: Geronimo Amel, MD    10/04/2023 -   Chief Complaint  Patient presents with   Follow-up    Pt states she is f/u from hospital visit. No inhaler usage      HPI Jocelyn [name] 68 y.o. -presents with her daughter Jocelyn [name].  She is a former patient of Dr. Glendia [name].  She did see Dr. Slater Staff briefly in 2020.  After that not followed up much.  She has a longstanding history of polymyositis for which she sees Dr. Mai.  She has had this for 8 years she is on Imuran .  She believes it is in remission.  At baseline she never had any shortness of breath.  Infected 2020 CT scan of the chest that I personally visualized did not show any ILD [she is at risk for this because of prior history of remote sarcoidosis and also the polymyositis].  She is noted to have a history of coronary artery disease status post stent placement.  She also history of acid reflux.    She then was abruptly admitted 08/03/2023 with shortness of breath for 1 week.  Just prior to that she had undergone cardiac stressTest  She then was abruptly admitted 08/03/2023 with shortness of breath for 1 week.  Just prior to that she had undergone cardiac stress test without any problems.  She had a lactate of 7.66 L nasal cannula COVID-negative.  Massive pulmonary embolism saddle pulmonary emboli with RV/LV ratio 1.7.  Cardiac CPR requiring thrombolysis.  The thrombolysis was systemic.   She also underwent catheter directed therapy.  On 08/04/2019 for there are some concern for seizure activity.  Initial point-of-care ultrasound echo showed EF 35%.  But after thrombolysis the EF had improved in the hospital to 60 to 65%.  She was on milrinone .  She is finally discharged 08/11/2023.  She then followed up with nurse practitioner 09/21/2023.  She had anemia at the time of hospitalization.  We do not know the follow-up labs.  She did have follow-up CBC with Dr. Mai rheumatology yesterday.  She does not want to be stuck again because she is difficult veins.  I told him to follow-up with the rheumatologist about these results.  She is slowly convalescing.  She has been advised against driving.  She did see neurology yesterday Dr. Gregg.  I reviewed the notes.  She is not on antiseizure medications a 6 Miller meningioma was discovered in the hospital.  It appears to months of restriction has been advised with driving.  This would take her to mid March 2025.  She asked me my perspective.  I did tell her that from a pulm embolism perspective she can drive but needs to check with the neurologist because of the  restriction was on seizures.  Currently there is a chest wall hematoma on the right parasternal area.  She showed it to me and it is improving.  She says while she is getting better she still physically deconditioned.  She is going for physical therapy 1/week.  She is gaining her strength.  Memory prior to the PE episode is intact but not for the PE itself.  There is a little soreness in the chest that is improving she is moving slowly but it is also improving.  She and her daughter believes that the treatment for pulm embolism will be lifelong given the severity of this.  I did agree with this but also said that we would reassess in a year or 2.   She has upcoming cardiac echo and follow-up with cardiology Dr. Verlin in the month of December 2024.  CT Chest data from date: September  2024  - personally visualized and independently interpreted : Massive saddle emboli.  Personally interpreted yes.  No evidence of ILD. - my findings are: As below.  IMPRESSION: Extensive bilateral pulmonary emboli as described with evidence of right heart strain within RV/LV ratio of 1.74.   No other focal abnormality is noted.   Aortic Atherosclerosis (ICD10-I70.0).   Critical Value/emergent results were called by telephone at the time of interpretation on 08/03/2023 at 9:43 pm to Dr. CYNDEE DADA , who verbally acknowledged these results.     Electronically Signed   By: Oneil Devonshire M.D.   On: 08/03/2023 21:46    OV 09/18/2024  Subjective:  Patient ID: Jocelyn [name], female , DOB: 1956-05-08 , age 40 y.o. , MRN: [id number] , ADDRESS: [address] [place] [address] PCP Jocelyn Aland, MD Patient Care Team: Jocelyn Aland, MD as PCP - General (Family Medicine) Verlin Lonni BIRCH, MD as PCP - Cardiology (Cardiology) [place], Harlene HERO, FNP (Cardiology)  This Provider for this visit: Treatment Team:  Attending Provider: Geronimo Amel, MD    09/18/2024 -   Chief Complaint  Patient presents with   Follow-up    Pt states since LOV breathing has been okay SOB occurs with exertion Prod cough occurs occasionally ( phlegm yellow) also Dry cough occasionally      HPI Jocelyn [name] 68 y.o. - Jocelyn [name] is a 68 year old female with a history of life-threatening blood clot sept 2024 and polymyositis who presents for follow-up.  She has not experienced any hospitalizations or surgeries in the past year. She had an emergency room visit due to an issue with her potassium levels, which was resolved. She continues to take Eliquis  as prescribed for lifelong anticoagulation, and the bruising around her chest has resolved.  She experiences shortness of breath during activities such as walking or moving around too much. This symptom has been present since before  her blood clot and has not worsened. She reports that her shortness of breath has been present since before her blood clot and that she was diagnosed with polymyositis prior to the blood clot.  She works for a few hours a day at [employer] in different branches and she does feel fatigued.  But this is baseline.  She reports nasal congestion and postnasal drip, particularly in the mornings, leading to coughing. This is a new complaint   Otehrwise, No new health issues aside from shortness of breath with exertion and nasal congestion. Breathing is reported as stable.   Auct 2025: creat 1.37mg % and baseline mild high Aug 2025:  chgb 12.2 and norma       SIT STAND TEST - goal 15 times   09/18/2024    O2 used ra   PRobe - finter or forehead finger   Number sit and stand completed - goal 15 15   Time taken to complete Slow 1 min and 32 sec   Resting Pulse Ox/HR/Dyspnea  98% and 65/min and dyspnea of 3/10    Peak measures 95 % and 83/min and dyspnea of 5/10   Final Pulse Ox/HR 98% and 70/min and dyspnea of 4/10   Desaturated </= 88% no   Desaturated <= 3% points no   Got Tachycardic >/= 90/min no   Miscellaneous comments Slow and winded        has a past medical history of Acute bronchitis, Allergic rhinitis, cause unspecified, Anemia, Anxiety, B12 deficiency, BV (bacterial vaginosis) (06/22/1996), Calculus of kidney, Calculus of kidney, Coronary atherosclerosis of unspecified type of vessel, native or graft, Dizziness, Essential hypertension, benign, Fibroid (2003), Fibromyalgia, H/O dysmenorrhea (2008), H/O varicella, Headache(784.0), HSV-2 infection (2009), Hyperplastic colon polyp (05/16/2014), Irritable bowel syndrome, Meniere's disease, unspecified, Menses, irregular (2003), Myalgia and myositis, unspecified, Myocardial infarction (HCC), Obstructive sleep apnea (adult) (pediatric), Perimenopausal symptoms (2003), Pure hypercholesterolemia, Sarcoidosis, Type II or unspecified type  diabetes mellitus without mention of complication, not stated as uncontrolled, Unspecified venous (peripheral) insufficiency, Vitamin D  deficiency, Vulvitis (2010), and Yeast infection.   reports that she has never smoked. She has never used smokeless tobacco.  Past Surgical History:  Procedure Laterality Date   COLONOSCOPY     CORONARY STENT INTERVENTION N/A 06/15/2017   Procedure: CORONARY STENT INTERVENTION;  Surgeon: Jocelyn Candyce RAMAN, MD;  Location: [facility] INVASIVE CV LAB;  Service: Cardiovascular;  Laterality: N/A;   heart catherization     IR ANGIOGRAM PULMONARY BILATERAL SELECTIVE  08/04/2023   IR ANGIOGRAM SELECTIVE EACH ADDITIONAL VESSEL  08/04/2023   IR ANGIOGRAM SELECTIVE EACH ADDITIONAL VESSEL  08/04/2023   IR THROMBECT PRIM MECH INIT (INCLU) MOD SED  08/04/2023   IR US  GUIDE VASC ACCESS RIGHT  08/04/2023   LEFT HEART CATH AND CORONARY ANGIOGRAPHY N/A 06/15/2017   Procedure: LEFT HEART CATH AND CORONARY ANGIOGRAPHY;  Surgeon: Jocelyn Candyce RAMAN, MD;  Location: [facility] INVASIVE CV LAB;  Service: Cardiovascular;  Laterality: N/A;   RADIOLOGY WITH ANESTHESIA N/A 08/04/2023   Procedure: IR WITH ANESTHESIA;  Surgeon: Dolphus Carrion, MD;  Location: [facility] OR;  Service: Radiology;  Laterality: N/A;   TONSILLECTOMY      Allergies  Allergen Reactions   Actos [Pioglitazone] Other (See Comments)    REACTION: pt states INTOL w/ edema   Codeine  Other (See Comments)    REACTION: vomiting   Januvia [Sitagliptin] Nausea Only   Lactose Intolerance (Gi) Diarrhea   Morphine Nausea Only and Nausea And Vomiting    REACTION: vomiting REACTION: vomiting   Repatha  [Evolocumab ]     WAS NOT EFFECTIVE IN LOWERING LDL    Immunization History  Administered Date(s) Administered   Influenza Split 08/16/2011, 08/16/2012, 08/16/2013, 09/04/2014   Influenza Whole 09/16/2009, 08/15/2010   Influenza,inj,Quad PF,6+ Mos 08/30/2016, 06/16/2017, 10/23/2018, 09/14/2019   Influenza-Unspecified 08/19/2015    PFIZER(Purple Top)SARS-COV-2 Vaccination 01/17/2020, 02/13/2020   Pneumococcal Polysaccharide-23 11/15/2004   Tdap 02/22/2011    Family History  Adopted: Yes  Problem Relation Age of Onset   Other Other        ADOPTED   Colon cancer Neg Hx    Esophageal cancer Neg Hx    Rectal cancer Neg Hx  Stomach cancer Neg Hx    Emphysema Neg Hx    Seizures Neg Hx      Current Outpatient Medications:    ALPRAZolam  (XANAX ) 0.5 MG tablet, Take 0.5 mg by mouth 3 (three) times daily., Disp: , Rfl:    apixaban  (ELIQUIS ) 5 MG TABS tablet, Take 1 tablet (5 mg total) by mouth 2 (two) times daily., Disp: 180 tablet, Rfl: 3   azaTHIOprine  (IMURAN ) 50 MG tablet, Take 1 tablet by mouth daily., Disp: , Rfl:    azelastine (ASTELIN) 0.1 % nasal spray, Place 2 sprays into both nostrils 2 (two) times daily. Use in each nostril as directed, Disp: 30 mL, Rfl: 12   Bempedoic Acid  (NEXLETOL ) 180 MG TABS, Take 1 tablet (180 mg total) by mouth daily., Disp: 90 tablet, Rfl: 3   Empagliflozin -metFORMIN  HCl ER (SYNJARDY XR) 25-1000 MG TB24, Take 25-1,000 tablets by mouth daily., Disp: , Rfl:    GEMTESA 75 MG TABS, Take 1 tablet by mouth daily., Disp: , Rfl:    icosapent  Ethyl (VASCEPA ) 1 g capsule, TAKE 2 CAPSULES(2 GRAMS) BY MOUTH TWICE DAILY, Disp: 360 capsule, Rfl: 2   metoprolol  succinate (TOPROL -XL) 100 MG 24 hr tablet, Take 1 tablet (100 mg total) by mouth daily. Take with or immediately following a meal., Disp: 90 tablet, Rfl: 3   nitroGLYCERIN  (NITROSTAT ) 0.4 MG SL tablet, Place 1 tablet (0.4 mg total) under the tongue every 5 (five) minutes (up to 3 doses) as needed for chest pain., Disp: 75 each, Rfl: 2   ondansetron  (ZOFRAN ) 4 MG tablet, Take 1 tablet (4 mg total) by mouth every 6 (six) hours., Disp: 12 tablet, Rfl: 0   ONETOUCH VERIO test strip, CHECK BLOOD SUGAR TID AS DIRECTED, Disp: , Rfl:    PRALUENT  150 MG/ML SOAJ, ADMINISTER 1 ML UNDER THE SKIN EVERY 14 DAYS, Disp: 6 mL, Rfl: 3   pantoprazole   (PROTONIX ) 40 MG tablet, Take 1 tablet (40 mg total) by mouth daily. (Patient not taking: Reported on 09/18/2024), Disp: 30 tablet, Rfl: 0      Objective:   Vitals:   09/18/24 1448  BP: 112/68  Pulse: 63  Temp: 98.5 F (36.9 C)  TempSrc: Oral  SpO2: 98%  Weight: 151 lb 9.6 oz (68.8 kg)  Height: 5' 1.5 (1.562 m)    Estimated body mass index is 28.18 kg/m as calculated from the following:   Height as of this encounter: 5' 1.5 (1.562 m).   Weight as of this encounter: 151 lb 9.6 oz (68.8 kg).  @WEIGHTCHANGE @  [employer]   09/18/24 1448  Weight: 151 lb 9.6 oz (68.8 kg)     Physical Exam   General: No distress. Looks well O2 at rest: no Cane present: no Sitting in wheel chair: no Frail: no Obese: no Neuro: Alert and Oriented x 3. GCS 15. Speech normal Psych: Pleasant Resp:  Barrel Chest - no.  Wheeze - no, Crackles - no, No overt respiratory distress CVS: Normal heart sounds. Murmurs - no Ext: Stigmata of Connective Tissue Disease - no HEENT: Normal upper airway. PEERL +. No post nasal drip        Assessment/     Assessment & Plan DOE (dyspnea on exertion)  Other fatigue  Polymyositis (HCC)  History of pulmonary embolism  Chronic sinusitis, unspecified location  Vaccine counseling    PLAN Patient Instructions     ICD-10-CM   1. DOE (dyspnea on exertion)  R06.09     2. Other fatigue  R53.83  3. Polymyositis (HCC)  M33.20     4. History of pulmonary embolism  Z86.711     5. Chronic sinusitis, unspecified location  J32.9     6. Vaccine counseling  Z71.85        -History of massive pulmonary embolism September 2024 with cardiopulmonary arrest.  Glad you are better right now and you are on Eliquis  life long without any bleeding episodes  - You are having residual shortness of breath and fatigue which is similar to preillness.  -You are also reporting chronic sinus congestion  -Noted that you have your flu shot with your primary  care doctor.  Plan - Continue Eliquis ; lifelong according to hematology - Continue polymyositis treatment - Start Astelin nasal spray for your nasal congestion - Do echocardiogram in 6 months; - do high-resolution CT chest in 6 months - Avoid respiratory illness sick exposure and control your risk for respiratory infection  - be uptodate with all respiratory vaccines  - avoid sick contacts especially in areas of indoor clusters (churches, weddings, funerals, family gatherings, birthdays, planes, malls, indoor areas especially)   - mask in these areas   - discourage sick people from coming in close contact with you   Follow-up - 6 months or sooner if needed; with nurse practitioner if evaluation is normal at follow-up then you can be discharged    FOLLOWUP    Return in about 6 months (around 03/18/2025) for with any of the APPS, after HRCT chest and after echo.    SIGNATURE    Dr. Dorethia Cave, M.D., F.C.C.P,  Pulmonary and Critical Care Medicine Staff Physician, [facility] Director - Interstitial Lung Disease  Program  Pulmonary Fibrosis [facility] at [facility] [place], [place], [address]  Pager: [phone], If no answer or between  15:00h - 7:00h: call [phone] Telephone: [phone]  3:11 PM 09/18/2024

## 2024-09-25 DIAGNOSIS — E1169 Type 2 diabetes mellitus with other specified complication: Secondary | ICD-10-CM | POA: Diagnosis not present

## 2024-09-25 DIAGNOSIS — E559 Vitamin D deficiency, unspecified: Secondary | ICD-10-CM | POA: Diagnosis not present

## 2024-09-25 DIAGNOSIS — E782 Mixed hyperlipidemia: Secondary | ICD-10-CM | POA: Diagnosis not present

## 2024-09-25 DIAGNOSIS — F064 Anxiety disorder due to known physiological condition: Secondary | ICD-10-CM | POA: Diagnosis not present

## 2024-09-25 DIAGNOSIS — E875 Hyperkalemia: Secondary | ICD-10-CM | POA: Diagnosis not present

## 2024-09-25 DIAGNOSIS — F339 Major depressive disorder, recurrent, unspecified: Secondary | ICD-10-CM | POA: Diagnosis not present

## 2024-09-25 DIAGNOSIS — D649 Anemia, unspecified: Secondary | ICD-10-CM | POA: Diagnosis not present

## 2024-09-25 DIAGNOSIS — E785 Hyperlipidemia, unspecified: Secondary | ICD-10-CM | POA: Diagnosis not present

## 2024-09-25 DIAGNOSIS — I10 Essential (primary) hypertension: Secondary | ICD-10-CM | POA: Diagnosis not present

## 2024-10-03 ENCOUNTER — Other Ambulatory Visit (HOSPITAL_COMMUNITY): Payer: Self-pay

## 2024-10-03 NOTE — Progress Notes (Unsigned)
 Paramedicine Encounter    Patient ID: Jocelyn [name], female    DOB: [birth year], 68 y.o.   MRN: [id number]   Complaints-denies  Edema-none   Compliance with meds-yes  Pill box filled-yes  If so, by whom-pt fills it   Refills needed-none     Pt reports she is doing ok. She denies increased sob, no dizziness, no c/p.  Pt does report she gets sob if she exerts herself too much.  She does her own meds.  No issues with meds or anything.  She is due for echo and heart clinic appoint in [place]. Would like to continue f/u until the results from that.  No edema noted.  Will f/u in a few wks.    BP 122/70   Pulse 72   Resp 16   Wt 150 lb (68 kg)   SpO2 97%   BMI 27.88 kg/m  Weight yesterday-150 Last visit weight-151  Patient Care Team: Benjamine Aland, MD as PCP - General (Family Medicine) Verlin Lonni BIRCH, MD as PCP - Cardiology (Cardiology) Glena Harlene HERO, FNP (Cardiology)  Patient Active Problem List   Diagnosis Date Noted   Macrocytosis 07/09/2024   Physical deconditioning 08/23/2023   Acute respiratory failure with hypoxia (HCC) 08/05/2023   Hx of acute saddle pulmonary embolism in September 2024 08/03/2023   Pulmonary edema 08/03/2023   Degenerative arthritis of knee 08/20/2021   Hx of pleural effusion    Dyspnea 08/16/2018   Fall on or from sidewalk curb, initial encounter 05/31/2018   History of acute inferior wall MI 08/22/2017   Diabetes mellitus (HCC)    S/P right coronary artery (RCA) stent placement    Overweight 05/05/2017   Diabetes mellitus type 2, controlled, without complications (HCC) 12/16/2016   Asthmatic bronchitis , chronic (HCC) 05/10/2016   Polymyositis (HCC) 09/15/2015   History of sarcoidosis 09/15/2015   Falling 03/13/2015   Edema 02/13/2014   Coronary artery disease involving native heart without angina pectoris 10/29/2013   Hyperlipidemia 10/29/2013   Iron  (Fe) deficiency anemia 08/08/2013   Vitamin B 12 deficiency 08/08/2013    GERD (gastroesophageal reflux disease) 09/08/2011   Weakness 02/22/2011   Depression 02/22/2011   MENIERE'S DISEASE 04/30/2009   Vitamin D  deficiency 02/05/2009   Venous (peripheral) insufficiency 12/12/2007   RENAL CALCULUS 12/12/2007   DIZZINESS 12/12/2007   HYPERCHOLESTEROLEMIA 09/16/2007   Anxiety state 09/16/2007   HYPERTENSION, BENIGN 09/16/2007   Allergic rhinitis 09/16/2007   IRRITABLE BOWEL SYNDROME 09/16/2007   Myalgia and myositis 09/16/2007    Current Outpatient Medications:    ALPRAZolam  (XANAX ) 0.5 MG tablet, Take 0.5 mg by mouth 3 (three) times daily., Disp: , Rfl:    apixaban  (ELIQUIS ) 5 MG TABS tablet, Take 1 tablet (5 mg total) by mouth 2 (two) times daily., Disp: 180 tablet, Rfl: 3   azaTHIOprine  (IMURAN ) 50 MG tablet, Take 1 tablet by mouth daily., Disp: , Rfl:    azelastine  (ASTELIN ) 0.1 % nasal spray, Place 2 sprays into both nostrils 2 (two) times daily. Use in each nostril as directed, Disp: 30 mL, Rfl: 12   Bempedoic Acid  (NEXLETOL ) 180 MG TABS, Take 1 tablet (180 mg total) by mouth daily., Disp: 90 tablet, Rfl: 3   Empagliflozin -metFORMIN  HCl ER (SYNJARDY XR) 25-1000 MG TB24, Take 25-1,000 tablets by mouth daily., Disp: , Rfl:    GEMTESA 75 MG TABS, Take 1 tablet by mouth daily., Disp: , Rfl:    icosapent  Ethyl (VASCEPA ) 1 g capsule, TAKE 2 CAPSULES(2 GRAMS) BY MOUTH  TWICE DAILY, Disp: 360 capsule, Rfl: 2   metoprolol  succinate (TOPROL -XL) 100 MG 24 hr tablet, Take 1 tablet (100 mg total) by mouth daily. Take with or immediately following a meal., Disp: 90 tablet, Rfl: 3   nitroGLYCERIN  (NITROSTAT ) 0.4 MG SL tablet, Place 1 tablet (0.4 mg total) under the tongue every 5 (five) minutes (up to 3 doses) as needed for chest pain., Disp: 75 each, Rfl: 2   ondansetron  (ZOFRAN ) 4 MG tablet, Take 1 tablet (4 mg total) by mouth every 6 (six) hours., Disp: 12 tablet, Rfl: 0   ONETOUCH VERIO test strip, CHECK BLOOD SUGAR TID AS DIRECTED, Disp: , Rfl:    pantoprazole   (PROTONIX ) 40 MG tablet, Take 1 tablet (40 mg total) by mouth daily. (Patient not taking: Reported on 09/18/2024), Disp: 30 tablet, Rfl: 0   PRALUENT  150 MG/ML SOAJ, ADMINISTER 1 ML UNDER THE SKIN EVERY 14 DAYS, Disp: 6 mL, Rfl: 3 Allergies  Allergen Reactions   Actos [Pioglitazone] Other (See Comments)    REACTION: pt states INTOL w/ edema   Codeine  Other (See Comments)    REACTION: vomiting   Januvia [Sitagliptin] Nausea Only   Lactose Intolerance (Gi) Diarrhea   Morphine Nausea Only and Nausea And Vomiting    REACTION: vomiting REACTION: vomiting   Repatha  [Evolocumab ]     WAS NOT EFFECTIVE IN LOWERING LDL      Social History   Socioeconomic History   Marital status: Single    Spouse name: Not on file   Number of children: 1   Years of education: Not on file   Highest education level: Master's degree (e.g., MA, MS, MEng, MEd, MSW, MBA)  Occupational History   Occupation: Retired Runner, Broadcasting/film/video   Tobacco Use   Smoking status: Never   Smokeless tobacco: Never   Tobacco comments:    Daily Caffeine - 1  Exercise 2-3 times/weekly  Vaping Use   Vaping status: Never Used  Substance and Sexual Activity   Alcohol use: No    Alcohol/week: 0.0 standard drinks of alcohol   Drug use: No   Sexual activity: Yes    Birth control/protection: None  Other Topics Concern   Not on file  Social History Narrative   Exercises 2-3 times weekly   Caffeine Use: 1 daily (tea or Pepsi)   Lives alone in a one story home.  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  Retired midwife.     Social Drivers of Corporate Investment Banker Strain: Low Risk  (08/05/2023)   Overall Financial Resource Strain (CARDIA)    Difficulty of Paying Living Expenses: Not hard at all  Food Insecurity: No Food Insecurity (07/09/2024)   Hunger Vital Sign    Worried About Running Out of Food in the Last Year: Never true    Ran Out of Food in the Last Year: Never true  Transportation Needs: No Transportation Needs (07/09/2024)    PRAPARE - Administrator, Civil Service (Medical): No    Lack of Transportation (Non-Medical): No  Physical Activity: Not on file  Stress: No Stress Concern Present (08/05/2023)   [employer] of Occupational Health - Occupational Stress Questionnaire    Feeling of Stress : Not at all  Social Connections: Not on file  Intimate Partner Violence: Not At Risk (07/09/2024)   Humiliation, Afraid, Rape, and Kick questionnaire    Fear of Current or Ex-Partner: No    Emotionally Abused: No    Physically Abused: No    Sexually Abused: No  Physical Exam      Future Appointments  Date Time Provider Department Center  10/24/2024  9:00 AM Claudene Arthea HERO, DO [facility] None  01/08/2025  3:00 PM [facility] PHLEBOTOMIST [facility] None  01/08/2025  3:30 PM Pasam, Chinita, MD [facility] None       [name], Paramedic [phone] [facility] Paramedic  10/03/24

## 2024-10-12 DIAGNOSIS — I2699 Other pulmonary embolism without acute cor pulmonale: Secondary | ICD-10-CM | POA: Diagnosis not present

## 2024-10-15 ENCOUNTER — Ambulatory Visit

## 2024-10-15 ENCOUNTER — Encounter: Payer: Self-pay | Admitting: Family Medicine

## 2024-10-15 ENCOUNTER — Ambulatory Visit: Admitting: Family Medicine

## 2024-10-15 ENCOUNTER — Telehealth: Payer: Self-pay

## 2024-10-15 VITALS — BP 138/92 | HR 65 | Ht 61.5 in

## 2024-10-15 DIAGNOSIS — R079 Chest pain, unspecified: Secondary | ICD-10-CM

## 2024-10-15 DIAGNOSIS — G2589 Other specified extrapyramidal and movement disorders: Secondary | ICD-10-CM | POA: Insufficient documentation

## 2024-10-15 DIAGNOSIS — I517 Cardiomegaly: Secondary | ICD-10-CM | POA: Diagnosis not present

## 2024-10-15 DIAGNOSIS — M170 Bilateral primary osteoarthritis of knee: Secondary | ICD-10-CM | POA: Diagnosis not present

## 2024-10-15 DIAGNOSIS — R0989 Other specified symptoms and signs involving the circulatory and respiratory systems: Secondary | ICD-10-CM | POA: Diagnosis not present

## 2024-10-15 MED ORDER — PREDNISONE 20 MG PO TABS: 20.0000 mg | ORAL_TABLET | Freq: Every day | ORAL | 0 refills | Status: AC

## 2024-10-15 NOTE — Assessment & Plan Note (Signed)
 Scapular dyskinesis noted.  Discussed icing regimen and home exercises, patient which activities to do and which ones to avoid.  Increase activity slowly.  Patient given exercises.  Thoracic back pain noted with patient's history of congestive heart failure and pulmonary embolisms noted on chest x-ray to further evaluate.  Follow-up recommended.  6 to 8 weeks otherwise.

## 2024-10-15 NOTE — Patient Instructions (Addendum)
 Xray today We will get gel approved See me again in 2 months

## 2024-10-15 NOTE — Telephone Encounter (Signed)
 Paper received from [employer] regarding pt's concentrator and o2 machine. This is requiring a provider signature and lov notes. I will print and fax once this has been signed.

## 2024-10-15 NOTE — Progress Notes (Signed)
 [facility] [address] [place] [address] Phone: [phone] Subjective:    I'm seeing this patient by the request  of:  Benjamine Aland, MD  CC: Bilateral knee pain  YEP:Dlagzrupcz  Jocelyn [name] is a 68 y.o. female coming in with complaint of B knee pain. Patient states that R>L. Pain has become constant.   Also c/o pain in thoracic spine. Also feel tight. Pain can move into lumbar and cervical spine.  Patient states that it is kind of severe.  Continues to have some discomfort from time to time.  Has had a history of kidney stones but states that it is different.  Patient is also past medical history significant for pulmonary embolism as well as congestive heart failure.  Denies any shortness of breath.  Patient was taking Eliquis  regularly    Past Medical History:  Diagnosis Date   Acute bronchitis    Allergic rhinitis, cause unspecified    Anemia    Anxiety    B12 deficiency    BV (bacterial vaginosis) 06/22/1996   Calculus of kidney    Calculus of kidney    Coronary atherosclerosis of unspecified type of vessel, native or graft    Dizziness    Essential hypertension, benign    Fibroid 2003   Fibromyalgia    H/O dysmenorrhea 2008   H/O varicella    Headache(784.0)    frequently   HSV-2 infection 2009   Hyperplastic colon polyp 05/16/2014   Irritable bowel syndrome    Meniere's disease, unspecified    Menses, irregular 2003   Myalgia and myositis, unspecified    Myocardial infarction (HCC)    Obstructive sleep apnea (adult) (pediatric)    Perimenopausal symptoms 2003   Pure hypercholesterolemia    Sarcoidosis    Type II or unspecified type diabetes mellitus without mention of complication, not stated as uncontrolled    Unspecified venous (peripheral) insufficiency    Vitamin D  deficiency    Vulvitis 2010   Yeast infection    Past Surgical History:  Procedure Laterality Date   COLONOSCOPY     CORONARY STENT INTERVENTION N/A  06/15/2017   Procedure: CORONARY STENT INTERVENTION;  Surgeon: Dann Candyce RAMAN, MD;  Location: [facility];  Service: Cardiovascular;  Laterality: N/A;   heart catherization     IR ANGIOGRAM PULMONARY BILATERAL SELECTIVE  08/04/2023   IR ANGIOGRAM SELECTIVE EACH ADDITIONAL VESSEL  08/04/2023   IR ANGIOGRAM SELECTIVE EACH ADDITIONAL VESSEL  08/04/2023   IR THROMBECT PRIM MECH INIT (INCLU) MOD SED  08/04/2023   IR US  GUIDE VASC ACCESS RIGHT  08/04/2023   LEFT HEART CATH AND CORONARY ANGIOGRAPHY N/A 06/15/2017   Procedure: LEFT HEART CATH AND CORONARY ANGIOGRAPHY;  Surgeon: Dann Candyce RAMAN, MD;  Location: [facility];  Service: Cardiovascular;  Laterality: N/A;   RADIOLOGY WITH ANESTHESIA N/A 08/04/2023   Procedure: IR WITH ANESTHESIA;  Surgeon: Dolphus Carrion, MD;  Location: [facility] OR;  Service: Radiology;  Laterality: N/A;   TONSILLECTOMY     Social History   Socioeconomic History   Marital status: Single    Spouse name: Not on file   Number of children: 1   Years of education: Not on file   Highest education level: Master's degree (e.g., MA, MS, MEng, MEd, MSW, MBA)  Occupational History   Occupation: Retired Runner, Broadcasting/film/video   Tobacco Use   Smoking status: Never   Smokeless tobacco: Never   Tobacco comments:    Daily  Caffeine - 1  Exercise 2-3 times/weekly  Vaping Use   Vaping status: Never Used  Substance and Sexual Activity   Alcohol use: No    Alcohol/week: 0.0 standard drinks of alcohol   Drug use: No   Sexual activity: Yes    Birth control/protection: None  Other Topics Concern   Not on file  Social History Narrative   Exercises 2-3 times weekly   Caffeine Use: 1 daily (tea or Pepsi)   Lives alone in a one story home.  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  Retired midwife.     Social Drivers of Corporate Investment Banker Strain: Low Risk  (08/05/2023)   Overall Financial Resource Strain (CARDIA)    Difficulty of Paying Living Expenses: Not hard at all  Food  Insecurity: No Food Insecurity (07/09/2024)   Hunger Vital Sign    Worried About Running Out of Food in the Last Year: Never true    Ran Out of Food in the Last Year: Never true  Transportation Needs: No Transportation Needs (07/09/2024)   PRAPARE - Administrator, Civil Service (Medical): No    Lack of Transportation (Non-Medical): No  Physical Activity: Not on file  Stress: No Stress Concern Present (08/05/2023)   [employer] of Occupational Health - Occupational Stress Questionnaire    Feeling of Stress : Not at all  Social Connections: Not on file   Allergies  Allergen Reactions   Actos [Pioglitazone] Other (See Comments)    REACTION: pt states INTOL w/ edema   Codeine  Other (See Comments)    REACTION: vomiting   Januvia [Sitagliptin] Nausea Only   Lactose Intolerance (Gi) Diarrhea   Morphine Nausea Only and Nausea And Vomiting    REACTION: vomiting REACTION: vomiting   Repatha  [Evolocumab ]     WAS NOT EFFECTIVE IN LOWERING LDL   Family History  Adopted: Yes  Problem Relation Age of Onset   Other Other        ADOPTED   Colon cancer Neg Hx    Esophageal cancer Neg Hx    Rectal cancer Neg Hx    Stomach cancer Neg Hx    Emphysema Neg Hx    Seizures Neg Hx     Current Outpatient Medications (Endocrine & Metabolic):    Empagliflozin -metFORMIN  HCl ER (SYNJARDY XR) 25-1000 MG TB24, Take 25-1,000 tablets by mouth daily.   predniSONE  (DELTASONE ) 20 MG tablet, Take 1 tablet (20 mg total) by mouth daily with breakfast.  Current Outpatient Medications (Cardiovascular):    Bempedoic Acid  (NEXLETOL ) 180 MG TABS, Take 1 tablet (180 mg total) by mouth daily.   icosapent  Ethyl (VASCEPA ) 1 g capsule, TAKE 2 CAPSULES(2 GRAMS) BY MOUTH TWICE DAILY   metoprolol  succinate (TOPROL -XL) 100 MG 24 hr tablet, Take 1 tablet (100 mg total) by mouth daily. Take with or immediately following a meal.   nitroGLYCERIN  (NITROSTAT ) 0.4 MG SL tablet, Place 1 tablet (0.4 mg total) under  the tongue every 5 (five) minutes (up to 3 doses) as needed for chest pain.   PRALUENT  150 MG/ML SOAJ, ADMINISTER 1 ML UNDER THE SKIN EVERY 14 DAYS  Current Outpatient Medications (Respiratory):    azelastine  (ASTELIN ) 0.1 % nasal spray, Place 2 sprays into both nostrils 2 (two) times daily. Use in each nostril as directed   Current Outpatient Medications (Hematological):    apixaban  (ELIQUIS ) 5 MG TABS tablet, Take 1 tablet (5 mg total) by mouth 2 (two) times daily.  Current Outpatient Medications (Other):  ALPRAZolam  (XANAX ) 0.5 MG tablet, Take 0.5 mg by mouth 3 (three) times daily.   azaTHIOprine  (IMURAN ) 50 MG tablet, Take 1 tablet by mouth daily.   GEMTESA 75 MG TABS, Take 1 tablet by mouth daily.   ondansetron  (ZOFRAN ) 4 MG tablet, Take 1 tablet (4 mg total) by mouth every 6 (six) hours.   ONETOUCH VERIO test strip, CHECK BLOOD SUGAR TID AS DIRECTED   pantoprazole  (PROTONIX ) 40 MG tablet, Take 1 tablet (40 mg total) by mouth daily.   Reviewed prior external information including notes and imaging from  primary care provider As well as notes that were available from care everywhere and other healthcare systems.  Past medical history, social, surgical and family history all reviewed in electronic medical record.  No pertanent information unless stated regarding to the chief complaint.   Review of Systems:  No headache, visual changes, nausea, vomiting, diarrhea, constipation, dizziness, abdominal pain, skin rash, fevers, chills, night sweats, weight loss, swollen lymph nodes, body aches, joint swelling, chest pain, shortness of breath, mood changes. POSITIVE muscle aches  Objective  Blood pressure (!) 138/92, pulse 65, height 5' 1.5 (1.562 m), SpO2 100%.   General: No apparent distress alert and oriented x3 mood and affect normal, dressed appropriately.  HEENT: Pupils equal, extraocular movements intact  Respiratory: Patient's speak in full sentences and does not appear short  of breath  Cardiovascular: No lower extremity edema, non tender, no erythema   Bilateral knees do have arthritic changes noted.  Trace effusion noted to the knees bilaterally left greater than right.  Instability with valgus and varus force.  Mid back exam does not have any specific findings.  No midline tenderness.  Seems to be diffusely uncomfortable.  Some mild scapular dyskinesis noted.  After informed written and verbal consent, patient was seated on exam table. Right knee was prepped with alcohol swab and utilizing anterolateral approach, patient's right knee space was injected with 4:1  marcaine  0.5%: Kenalog  40mg /dL. Patient tolerated the procedure well without immediate complications.  After informed written and verbal consent, patient was seated on exam table. Left knee was prepped with alcohol swab and utilizing anterolateral approach, patient's left knee space was injected with 4:1  marcaine  0.5%: Kenalog  40mg /dL. Patient tolerated the procedure well without immediate complications.   Impression and Recommendations:      The above documentation has been reviewed and is accurate and complete Jacoya Bauman M Layden Caterino, DO

## 2024-10-15 NOTE — Assessment & Plan Note (Signed)
 Chronic problem with worsening symptoms.  Bilateral injections given again today.  Tolerated the procedure well, with discussed that we could consider the possibility of viscosupplementation at the beginning of next year.  Continue to be active, continue to keep weight under control.  Follow-up again in 6 to 8 weeks

## 2024-10-17 DIAGNOSIS — M3322 Polymyositis with myopathy: Secondary | ICD-10-CM | POA: Diagnosis not present

## 2024-10-18 ENCOUNTER — Ambulatory Visit: Payer: Self-pay | Admitting: Family Medicine

## 2024-10-18 DIAGNOSIS — Z130 Encounter for screening for diseases of the blood and blood-forming organs and certain disorders involving the immune mechanism: Secondary | ICD-10-CM | POA: Diagnosis not present

## 2024-10-19 DIAGNOSIS — M3322 Polymyositis with myopathy: Secondary | ICD-10-CM | POA: Diagnosis not present

## 2024-10-19 DIAGNOSIS — E663 Overweight: Secondary | ICD-10-CM | POA: Diagnosis not present

## 2024-10-19 DIAGNOSIS — Z6829 Body mass index (BMI) 29.0-29.9, adult: Secondary | ICD-10-CM | POA: Diagnosis not present

## 2024-10-19 DIAGNOSIS — Z79899 Other long term (current) drug therapy: Secondary | ICD-10-CM | POA: Diagnosis not present

## 2024-10-19 DIAGNOSIS — J948 Other specified pleural conditions: Secondary | ICD-10-CM | POA: Diagnosis not present

## 2024-10-19 DIAGNOSIS — M25561 Pain in right knee: Secondary | ICD-10-CM | POA: Diagnosis not present

## 2024-10-19 DIAGNOSIS — Z86711 Personal history of pulmonary embolism: Secondary | ICD-10-CM | POA: Diagnosis not present

## 2024-10-22 NOTE — Telephone Encounter (Signed)
 This has been signed, lov printed, and this has been faxed. NFN

## 2024-10-24 ENCOUNTER — Other Ambulatory Visit (HOSPITAL_COMMUNITY): Payer: Self-pay

## 2024-10-24 ENCOUNTER — Ambulatory Visit: Admitting: Family Medicine

## 2024-10-24 ENCOUNTER — Telehealth: Payer: Self-pay

## 2024-10-24 NOTE — Telephone Encounter (Signed)
 Patient ran for Monovisc for bilateral knees on 10/24/24. Case ID: [id number]. Pending approval.

## 2024-10-24 NOTE — Telephone Encounter (Signed)
-----   Message from Berwyn Posey sent at 10/15/2024  3:44 PM EST ----- Regarding: visco Can you please run patient for visco for both knees?   She had Monovisc in July 2024.   Thank you

## 2024-10-24 NOTE — Progress Notes (Signed)
 Paramedicine Encounter    Patient ID: Jocelyn [name], female    DOB: 1956-01-03, 68 y.o.   MRN: [id number]   Complaints-none  Edema-none  Compliance with meds-yes  Pill box filled-no If so, by whom-pt fills it   Refills needed-eliquis -she called to refill   Pt reports she is doing ok. She denies increased sob unless she walks way too far or over exerts herself too much.  She denies c/p, no dizziness,  no edema noted.  Taking all her meds. She has echo coming up in [place] and will need to get f/u with provider. She called when clinic told her to, to get appoint and was told their schedules are not out yet.   She went to see PCP yesterday. Wants her to take vit d. She also got injections in her knee recently.   Will f/u in a few wks-may be 3 wks with the holiday sch.    BP (!) 158/90   Pulse 70   Resp 16   SpO2 99%  Weight yesterday-? Last visit weight-150  Patient Care Team: Benjamine Aland, MD as PCP - General (Family Medicine) Verlin Lonni BIRCH, MD as PCP - Cardiology (Cardiology) Glena Harlene HERO, FNP (Cardiology)  Patient Active Problem List   Diagnosis Date Noted   Scapular dyskinesis 10/15/2024   Macrocytosis 07/09/2024   Physical deconditioning 08/23/2023   Acute respiratory failure with hypoxia (HCC) 08/05/2023   Hx of acute saddle pulmonary embolism in September 2024 08/03/2023   Pulmonary edema 08/03/2023   Degenerative arthritis of knee 08/20/2021   Hx of pleural effusion    Dyspnea 08/16/2018   Fall on or from sidewalk curb, initial encounter 05/31/2018   History of acute inferior wall MI 08/22/2017   Diabetes mellitus (HCC)    S/P right coronary artery (RCA) stent placement    Overweight 05/05/2017   Diabetes mellitus type 2, controlled, without complications (HCC) 12/16/2016   Asthmatic bronchitis , chronic (HCC) 05/10/2016   Polymyositis (HCC) 09/15/2015   History of sarcoidosis 09/15/2015   Falling 03/13/2015   Edema 02/13/2014   Coronary  artery disease involving native heart without angina pectoris 10/29/2013   Hyperlipidemia 10/29/2013   Iron  (Fe) deficiency anemia 08/08/2013   Vitamin B 12 deficiency 08/08/2013   GERD (gastroesophageal reflux disease) 09/08/2011   Weakness 02/22/2011   Depression 02/22/2011   MENIERE'S DISEASE 04/30/2009   Vitamin D  deficiency 02/05/2009   Venous (peripheral) insufficiency 12/12/2007   RENAL CALCULUS 12/12/2007   DIZZINESS 12/12/2007   HYPERCHOLESTEROLEMIA 09/16/2007   Anxiety state 09/16/2007   HYPERTENSION, BENIGN 09/16/2007   Allergic rhinitis 09/16/2007   IRRITABLE BOWEL SYNDROME 09/16/2007   Myalgia and myositis 09/16/2007    Current Outpatient Medications:    ALPRAZolam  (XANAX ) 0.5 MG tablet, Take 0.5 mg by mouth 3 (three) times daily., Disp: , Rfl:    apixaban  (ELIQUIS ) 5 MG TABS tablet, Take 1 tablet (5 mg total) by mouth 2 (two) times daily., Disp: 180 tablet, Rfl: 3   azaTHIOprine  (IMURAN ) 50 MG tablet, Take 1 tablet by mouth daily., Disp: , Rfl:    azelastine  (ASTELIN ) 0.1 % nasal spray, Place 2 sprays into both nostrils 2 (two) times daily. Use in each nostril as directed, Disp: 30 mL, Rfl: 12   Bempedoic Acid  (NEXLETOL ) 180 MG TABS, Take 1 tablet (180 mg total) by mouth daily., Disp: 90 tablet, Rfl: 3   Empagliflozin -metFORMIN  HCl ER (SYNJARDY XR) 25-1000 MG TB24, Take 25-1,000 tablets by mouth daily., Disp: , Rfl:  GEMTESA 75 MG TABS, Take 1 tablet by mouth daily., Disp: , Rfl:    icosapent  Ethyl (VASCEPA ) 1 g capsule, TAKE 2 CAPSULES(2 GRAMS) BY MOUTH TWICE DAILY, Disp: 360 capsule, Rfl: 2   metoprolol  succinate (TOPROL -XL) 100 MG 24 hr tablet, Take 1 tablet (100 mg total) by mouth daily. Take with or immediately following a meal., Disp: 90 tablet, Rfl: 3   nitroGLYCERIN  (NITROSTAT ) 0.4 MG SL tablet, Place 1 tablet (0.4 mg total) under the tongue every 5 (five) minutes (up to 3 doses) as needed for chest pain., Disp: 75 each, Rfl: 2   ondansetron  (ZOFRAN ) 4 MG  tablet, Take 1 tablet (4 mg total) by mouth every 6 (six) hours., Disp: 12 tablet, Rfl: 0   ONETOUCH VERIO test strip, CHECK BLOOD SUGAR TID AS DIRECTED, Disp: , Rfl:    pantoprazole  (PROTONIX ) 40 MG tablet, Take 1 tablet (40 mg total) by mouth daily., Disp: 30 tablet, Rfl: 0   PRALUENT  150 MG/ML SOAJ, ADMINISTER 1 ML UNDER THE SKIN EVERY 14 DAYS, Disp: 6 mL, Rfl: 3   predniSONE  (DELTASONE ) 20 MG tablet, Take 1 tablet (20 mg total) by mouth daily with breakfast., Disp: 5 tablet, Rfl: 0 Allergies  Allergen Reactions   Actos [Pioglitazone] Other (See Comments)    REACTION: pt states INTOL w/ edema   Codeine  Other (See Comments)    REACTION: vomiting   Januvia [Sitagliptin] Nausea Only   Lactose Intolerance (Gi) Diarrhea   Morphine Nausea Only and Nausea And Vomiting    REACTION: vomiting REACTION: vomiting   Repatha  [Evolocumab ]     WAS NOT EFFECTIVE IN LOWERING LDL      Social History   Socioeconomic History   Marital status: Single    Spouse name: Not on file   Number of children: 1   Years of education: Not on file   Highest education level: Master's degree (e.g., MA, MS, MEng, MEd, MSW, MBA)  Occupational History   Occupation: Retired Runner, Broadcasting/film/video   Tobacco Use   Smoking status: Never   Smokeless tobacco: Never   Tobacco comments:    Daily Caffeine - 1  Exercise 2-3 times/weekly  Vaping Use   Vaping status: Never Used  Substance and Sexual Activity   Alcohol use: No    Alcohol/week: 0.0 standard drinks of alcohol   Drug use: No   Sexual activity: Yes    Birth control/protection: None  Other Topics Concern   Not on file  Social History Narrative   Exercises 2-3 times weekly   Caffeine Use: 1 daily (tea or Pepsi)   Lives alone in a one story home.  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  Retired midwife.     Social Drivers of Corporate Investment Banker Strain: Low Risk  (08/05/2023)   Overall Financial Resource Strain (CARDIA)    Difficulty of Paying Living Expenses: Not  hard at all  Food Insecurity: No Food Insecurity (07/09/2024)   Hunger Vital Sign    Worried About Running Out of Food in the Last Year: Never true    Ran Out of Food in the Last Year: Never true  Transportation Needs: No Transportation Needs (07/09/2024)   PRAPARE - Administrator, Civil Service (Medical): No    Lack of Transportation (Non-Medical): No  Physical Activity: Not on file  Stress: No Stress Concern Present (08/05/2023)   [employer] of Occupational Health - Occupational Stress Questionnaire    Feeling of Stress : Not at all  Social Connections: Not  on file  Intimate Partner Violence: Not At Risk (07/09/2024)   Humiliation, Afraid, Rape, and Kick questionnaire    Fear of Current or Ex-Partner: No    Emotionally Abused: No    Physically Abused: No    Sexually Abused: No    Physical Exam      Future Appointments  Date Time Provider Department Center  11/16/2024  3:00 PM [facility] ECHO OP 1 [facility]  12/18/2024  3:00 PM Claudene Arthea HERO, DO [facility] None  01/08/2025  3:00 PM [facility] PHLEBOTOMIST [facility] None  01/08/2025  3:30 PM Pasam, Chinita, MD [facility] None       [name], Paramedic [phone] [facility] Paramedic  10/24/24

## 2024-10-25 NOTE — Telephone Encounter (Signed)
 Patient needs an appointment once medication is stocked.   [employer] approved for bilateral knees.  Deductible does not apply. Once the OOP has been met, patient is covered at 100%. Only one copay applies per date of service. Human requires the provider to refer to their fee schedule for the cost of the drug.   Prior authorization not required.   Confirmed with Tobias RAMAN ref# [id number]  Exp: 04/24/25

## 2024-10-26 NOTE — Telephone Encounter (Signed)
 Noted

## 2024-11-16 ENCOUNTER — Ambulatory Visit (HOSPITAL_COMMUNITY)
Admission: RE | Admit: 2024-11-16 | Discharge: 2024-11-16 | Disposition: A | Discharge status: Home / Self Care | Source: Ambulatory Visit | Attending: Internal Medicine | Admitting: Internal Medicine

## 2024-11-16 DIAGNOSIS — Z86711 Personal history of pulmonary embolism: Secondary | ICD-10-CM | POA: Diagnosis not present

## 2024-11-16 DIAGNOSIS — J329 Chronic sinusitis, unspecified: Secondary | ICD-10-CM | POA: Diagnosis not present

## 2024-11-16 DIAGNOSIS — I080 Rheumatic disorders of both mitral and aortic valves: Secondary | ICD-10-CM | POA: Insufficient documentation

## 2024-11-16 DIAGNOSIS — Z7185 Encounter for immunization safety counseling: Secondary | ICD-10-CM

## 2024-11-16 DIAGNOSIS — R5383 Other fatigue: Secondary | ICD-10-CM | POA: Insufficient documentation

## 2024-11-16 DIAGNOSIS — M3320 Polymyositis, organ involvement unspecified: Secondary | ICD-10-CM | POA: Diagnosis not present

## 2024-11-16 DIAGNOSIS — R0609 Other forms of dyspnea: Secondary | ICD-10-CM | POA: Insufficient documentation

## 2024-11-16 DIAGNOSIS — I517 Cardiomegaly: Secondary | ICD-10-CM | POA: Insufficient documentation

## 2024-11-16 LAB — ECHOCARDIOGRAM COMPLETE
Area-P 1/2: 3.01 cm2
Calc EF: 66.9 %
S' Lateral: 2.4 cm
Single Plane A2C EF: 68.4 %
Single Plane A4C EF: 65.5 %

## 2024-11-19 ENCOUNTER — Ambulatory Visit: Payer: Self-pay | Admitting: Internal Medicine

## 2024-11-19 NOTE — Progress Notes (Signed)
 Do discuss echo with cardiology. 3 mild abnormalities - > Mild heart muscle stiffness  - abnormal strain pattern  - mild leaky heart mitral valve

## 2024-11-28 ENCOUNTER — Other Ambulatory Visit (HOSPITAL_COMMUNITY): Payer: Self-pay

## 2024-11-28 NOTE — Progress Notes (Signed)
 Paramedicine Encounter    Patient ID: Jocelyn [name], female    DOB: Jan 30, 1956, 69 y.o.   MRN: [id number]   Complaints-fatigue/chest and back soreness off and on   Edema-none  Compliance with meds-yes  Pill box filled-yes she does it If so, by whom-herself   Refills needed-none   Pt reports she is doing ok. She has been having this discomfort of chest/back soreness-she shows it to me as sternal area of her chest that goes to her back. She thinks its arthritis. She has hx of sarcoidosis but can't remember how she felt at that time b/c its been so long ago.  This soreness has been going for several wks. It goes away on its own at times and sometimes she has to take tylenol .  She gets sob with exertion on long distances.  No sob with the soreness.  No cardiac pain. No dizziness, no edema noted.  She had ECHO done the other week.  She thinks the results went to pulm doc instead of dr rolan. She has appoint with them next week, so will send message to let them know she had this done prior to this visit.   I advised her to talk about this to her PCP and see if they can do blood work for blood clot and to see if the sarcoidosis is flaring up again.  She will f/u.   She also mentioned that her co-pays for her cholesterol meds are coming up pricey and thought she was on a grant. So sent pharmacy team message about that.  Her praluent  is coming up due for $128 and her nexletol  is $80.  She does her own pill box.  No other needs or issues at this time. Will continue to f/u.     CBG's -110s BP 120/70   Pulse 70   Resp 15   Wt 153 lb (69.4 kg)   SpO2 98%   BMI 28.44 kg/m  Weight yesterday-153 Last visit weight-150  Patient Care Team: Benjamine Aland, MD as PCP - General (Family Medicine) Verlin Lonni BIRCH, MD as PCP - Cardiology (Cardiology) Glena Harlene HERO, FNP (Cardiology)  Patient Active Problem List   Diagnosis Date Noted   Scapular dyskinesis 10/15/2024    Macrocytosis 07/09/2024   Physical deconditioning 08/23/2023   Acute respiratory failure with hypoxia (HCC) 08/05/2023   Hx of acute saddle pulmonary embolism in September 2024 08/03/2023   Pulmonary edema 08/03/2023   Degenerative arthritis of knee 08/20/2021   Hx of pleural effusion    Dyspnea 08/16/2018   Fall on or from sidewalk curb, initial encounter 05/31/2018   History of acute inferior wall MI 08/22/2017   Diabetes mellitus (HCC)    S/P right coronary artery (RCA) stent placement    Overweight 05/05/2017   Diabetes mellitus type 2, controlled, without complications (HCC) 12/16/2016   Asthmatic bronchitis , chronic (HCC) 05/10/2016   Polymyositis (HCC) 09/15/2015   History of sarcoidosis 09/15/2015   Falling 03/13/2015   Edema 02/13/2014   Coronary artery disease involving native heart without angina pectoris 10/29/2013   Hyperlipidemia 10/29/2013   Iron  (Fe) deficiency anemia 08/08/2013   Vitamin B 12 deficiency 08/08/2013   GERD (gastroesophageal reflux disease) 09/08/2011   Weakness 02/22/2011   Depression 02/22/2011   MENIERE'S DISEASE 04/30/2009   Vitamin D  deficiency 02/05/2009   Venous (peripheral) insufficiency 12/12/2007   RENAL CALCULUS 12/12/2007   DIZZINESS 12/12/2007   HYPERCHOLESTEROLEMIA 09/16/2007   Anxiety state 09/16/2007   HYPERTENSION, BENIGN 09/16/2007  Allergic rhinitis 09/16/2007   IRRITABLE BOWEL SYNDROME 09/16/2007   Myalgia and myositis 09/16/2007   Current Medications[1] Allergies[2]    Social History   Socioeconomic History   Marital status: Single    Spouse name: Not on file   Number of children: 1   Years of education: Not on file   Highest education level: Master's degree (e.g., MA, MS, MEng, MEd, MSW, MBA)  Occupational History   Occupation: Retired Runner, Broadcasting/film/video   Tobacco Use   Smoking status: Never   Smokeless tobacco: Never   Tobacco comments:    Daily Caffeine - 1  Exercise 2-3 times/weekly  Vaping Use   Vaping status:  Never Used  Substance and Sexual Activity   Alcohol use: No    Alcohol/week: 0.0 standard drinks of alcohol   Drug use: No   Sexual activity: Yes    Birth control/protection: None  Other Topics Concern   Not on file  Social History Narrative   Exercises 2-3 times weekly   Caffeine Use: 1 daily (tea or Pepsi)   Lives alone in a one story home.  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  Retired midwife.     Social Drivers of Health   Tobacco Use: Low Risk (10/15/2024)   Patient History    Smoking Tobacco Use: Never    Smokeless Tobacco Use: Never    Passive Exposure: Not on file  Financial Resource Strain: Low Risk (08/05/2023)   Overall Financial Resource Strain (CARDIA)    Difficulty of Paying Living Expenses: Not hard at all  Food Insecurity: No Food Insecurity (07/09/2024)   Epic    Worried About Radiation Protection Practitioner of Food in the Last Year: Never true    Ran Out of Food in the Last Year: Never true  Transportation Needs: No Transportation Needs (07/09/2024)   Epic    Lack of Transportation (Medical): No    Lack of Transportation (Non-Medical): No  Physical Activity: Not on file  Stress: No Stress Concern Present (08/05/2023)   [employer] of Occupational Health - Occupational Stress Questionnaire    Feeling of Stress : Not at all  Social Connections: Not on file  Intimate Partner Violence: Not At Risk (07/09/2024)   Epic    Fear of Current or Ex-Partner: No    Emotionally Abused: No    Physically Abused: No    Sexually Abused: No  Depression (PHQ2-9): Low Risk (07/09/2024)   Depression (PHQ2-9)    PHQ-2 Score: 0  Alcohol Screen: Low Risk (08/05/2023)   Alcohol Screen    Last Alcohol Screening Score (AUDIT): 0  Housing: Unknown (07/09/2024)   Epic    Unable to Pay for Housing in the Last Year: No    Number of Times Moved in the Last Year: Not on file    Homeless in the Last Year: No  Utilities: Not At Risk (07/09/2024)   Epic    Threatened with loss of utilities: No  Health  Literacy: Not on file    Physical Exam      Future Appointments  Date Time Provider Department Center  12/05/2024  2:40 PM Rolan Ezra RAMAN, MD [facility] None  12/18/2024  3:00 PM Claudene Arthea HERO, DO [facility] None  01/08/2025  3:00 PM [facility] PHLEBOTOMIST [facility] None  01/08/2025  3:30 PM Pasam, Chinita, MD [facility] None       [name], Paramedic [phone] [facility] Paramedic  11/28/2024     [1]  Current Outpatient Medications:    ALPRAZolam  (XANAX ) 0.5 MG tablet,  Take 0.5 mg by mouth 3 (three) times daily., Disp: , Rfl:    apixaban  (ELIQUIS ) 5 MG TABS tablet, Take 1 tablet (5 mg total) by mouth 2 (two) times daily., Disp: 180 tablet, Rfl: 3   azaTHIOprine  (IMURAN ) 50 MG tablet, Take 1 tablet by mouth daily., Disp: , Rfl:    azelastine  (ASTELIN ) 0.1 % nasal spray, Place 2 sprays into both nostrils 2 (two) times daily. Use in each nostril as directed, Disp: 30 mL, Rfl: 12   Bempedoic Acid  (NEXLETOL ) 180 MG TABS, Take 1 tablet (180 mg total) by mouth daily., Disp: 90 tablet, Rfl: 3   Empagliflozin -metFORMIN  HCl ER (SYNJARDY XR) 25-1000 MG TB24, Take 25-1,000 tablets by mouth daily., Disp: , Rfl:    GEMTESA 75 MG TABS, Take 1 tablet by mouth daily., Disp: , Rfl:    icosapent  Ethyl (VASCEPA ) 1 g capsule, TAKE 2 CAPSULES(2 GRAMS) BY MOUTH TWICE DAILY, Disp: 360 capsule, Rfl: 2   metoprolol  succinate (TOPROL -XL) 100 MG 24 hr tablet, Take 1 tablet (100 mg total) by mouth daily. Take with or immediately following a meal., Disp: 90 tablet, Rfl: 3   nitroGLYCERIN  (NITROSTAT ) 0.4 MG SL tablet, Place 1 tablet (0.4 mg total) under the tongue every 5 (five) minutes (up to 3 doses) as needed for chest pain., Disp: 75 each, Rfl: 2   ondansetron  (ZOFRAN ) 4 MG tablet, Take 1 tablet (4 mg total) by mouth every 6 (six) hours., Disp: 12 tablet, Rfl: 0   ONETOUCH VERIO test strip, CHECK BLOOD SUGAR TID AS DIRECTED, Disp: , Rfl:    pantoprazole  (PROTONIX ) 40 MG tablet, Take 1 tablet (40 mg  total) by mouth daily., Disp: 30 tablet, Rfl: 0   PRALUENT  150 MG/ML SOAJ, ADMINISTER 1 ML UNDER THE SKIN EVERY 14 DAYS, Disp: 6 mL, Rfl: 3   predniSONE  (DELTASONE ) 20 MG tablet, Take 1 tablet (20 mg total) by mouth daily with breakfast., Disp: 5 tablet, Rfl: 0 [2]  Allergies Allergen Reactions   Actos [Pioglitazone] Other (See Comments)    REACTION: pt states INTOL w/ edema   Codeine  Other (See Comments)    REACTION: vomiting   Januvia [Sitagliptin] Nausea Only   Lactose Intolerance (Gi) Diarrhea   Morphine Nausea Only and Nausea And Vomiting    REACTION: vomiting REACTION: vomiting   Repatha  [Evolocumab ]     WAS NOT EFFECTIVE IN LOWERING LDL

## 2024-11-29 ENCOUNTER — Other Ambulatory Visit (HOSPITAL_COMMUNITY): Payer: Self-pay

## 2024-11-29 ENCOUNTER — Telehealth (HOSPITAL_COMMUNITY): Payer: Self-pay

## 2024-11-29 NOTE — Telephone Encounter (Signed)
 Advanced Heart Failure Patient Advocate Encounter  The patient was approved for a [employer] grant that will help cover the cost of Nexletol , Praluent , Vascepa .  Total amount awarded, $2,500.  Effective: 11/17/2024 - 11/16/2025.  BIN [id number] PCN [id number] Group [id number] ID [id number]  Pharmacy provided with approval and processing information. Patient informed via paramedicine.  Rachel DEL, CPhT Rx Patient Advocate Phone: [phone]

## 2024-12-05 ENCOUNTER — Other Ambulatory Visit (HOSPITAL_COMMUNITY): Payer: Self-pay

## 2024-12-05 ENCOUNTER — Ambulatory Visit (HOSPITAL_COMMUNITY): Payer: Self-pay | Admitting: Cardiology

## 2024-12-05 ENCOUNTER — Encounter (HOSPITAL_COMMUNITY): Payer: Self-pay | Admitting: Cardiology

## 2024-12-05 ENCOUNTER — Ambulatory Visit (HOSPITAL_COMMUNITY)
Admission: RE | Admit: 2024-12-05 | Discharge: 2024-12-05 | Disposition: A | Discharge status: Home / Self Care | Source: Ambulatory Visit | Attending: Cardiology | Admitting: Cardiology

## 2024-12-05 VITALS — BP 134/80 | HR 62 | Wt 159.0 lb

## 2024-12-05 DIAGNOSIS — Z86711 Personal history of pulmonary embolism: Secondary | ICD-10-CM | POA: Insufficient documentation

## 2024-12-05 DIAGNOSIS — Z8674 Personal history of sudden cardiac arrest: Secondary | ICD-10-CM | POA: Insufficient documentation

## 2024-12-05 DIAGNOSIS — Z955 Presence of coronary angioplasty implant and graft: Secondary | ICD-10-CM | POA: Insufficient documentation

## 2024-12-05 DIAGNOSIS — M25569 Pain in unspecified knee: Secondary | ICD-10-CM | POA: Diagnosis not present

## 2024-12-05 DIAGNOSIS — I2510 Atherosclerotic heart disease of native coronary artery without angina pectoris: Secondary | ICD-10-CM | POA: Diagnosis not present

## 2024-12-05 DIAGNOSIS — Z7984 Long term (current) use of oral hypoglycemic drugs: Secondary | ICD-10-CM | POA: Insufficient documentation

## 2024-12-05 DIAGNOSIS — Z79899 Other long term (current) drug therapy: Secondary | ICD-10-CM | POA: Insufficient documentation

## 2024-12-05 DIAGNOSIS — D869 Sarcoidosis, unspecified: Secondary | ICD-10-CM | POA: Diagnosis not present

## 2024-12-05 DIAGNOSIS — I10 Essential (primary) hypertension: Secondary | ICD-10-CM | POA: Insufficient documentation

## 2024-12-05 DIAGNOSIS — R0600 Dyspnea, unspecified: Secondary | ICD-10-CM | POA: Insufficient documentation

## 2024-12-05 DIAGNOSIS — I252 Old myocardial infarction: Secondary | ICD-10-CM | POA: Insufficient documentation

## 2024-12-05 LAB — BASIC METABOLIC PANEL WITH GFR
Anion gap: 13 (ref 5–15)
BUN: 34 mg/dL — ABNORMAL HIGH (ref 8–23)
CO2: 21 mmol/L — ABNORMAL LOW (ref 22–32)
Calcium: 9.8 mg/dL (ref 8.9–10.3)
Chloride: 107 mmol/L (ref 98–111)
Creatinine, Ser: 1.43 mg/dL — ABNORMAL HIGH (ref 0.44–1.00)
GFR, Estimated: 39 mL/min — ABNORMAL LOW
Glucose, Bld: 89 mg/dL (ref 70–99)
Potassium: 4.9 mmol/L (ref 3.5–5.1)
Sodium: 141 mmol/L (ref 135–145)

## 2024-12-05 LAB — CBC
HCT: 41.9 % (ref 36.0–46.0)
Hemoglobin: 14.2 g/dL (ref 12.0–15.0)
MCH: 32.9 pg (ref 26.0–34.0)
MCHC: 33.9 g/dL (ref 30.0–36.0)
MCV: 97 fL (ref 80.0–100.0)
Platelets: 253 K/uL (ref 150–400)
RBC: 4.32 MIL/uL (ref 3.87–5.11)
RDW: 13.7 % (ref 11.5–15.5)
WBC: 5.8 K/uL (ref 4.0–10.5)
nRBC: 0.5 % — ABNORMAL HIGH (ref 0.0–0.2)

## 2024-12-05 LAB — LIPID PANEL
Cholesterol: 114 mg/dL (ref 0–200)
HDL: 46 mg/dL
LDL Cholesterol: 56 mg/dL (ref 0–99)
Total CHOL/HDL Ratio: 2.5 ratio
Triglycerides: 64 mg/dL
VLDL: 13 mg/dL (ref 0–40)

## 2024-12-05 LAB — PRO BRAIN NATRIURETIC PEPTIDE: Pro Brain Natriuretic Peptide: 167 pg/mL

## 2024-12-05 NOTE — Patient Instructions (Signed)
 Medication Changes:  None, continue current medications  Lab Work:  Labs done today, your results will be available in MyChart, we will contact you for abnormal readings.  Testing/Procedures:  You are scheduled for a Cardiopulmonary Exercise (CPX) Test as [facility] on: Date:      Time:   Expect to be in the lab for 2 hours. Please plan to arrive 30 minutes prior to your appointment. You may be asked to reschedule your test if you arrive 20 minutes or more after your scheduled appointment time.  Main Campus address: [address] [place], [place] [address] You may arrive to the Main Entrance A or Entrance C (free valet parking is available at both). -Main Entrance A (on [address]) :proceed to admitting for check in -Entrance C (on [employer]): proceed to [employer] parking or under hospital deck parking using this code _________  Check In: [facility] waiting room (1st floor)  General Instructions for the day of the test (Please follow all instructions from your physician): Refrain from ingesting a heavy meal, alcohol, or caffeine or using tobacco products within 2 hours of the test (DO NOT FAST for mare than 8 hours). You may have all other non-alcoholic, non -caffeinated beverage,a light snack (crackers,a piece of fruit, carrot sticks, toast bagel,etc) up to your appointment. Avoid significant exertion or exercise within 24 hours of your test. Be prepared to exercise and sweat. Your clothing should permit freedom of movement and include walking or running shoes. Women bring loose fitting short sleeved blouse.  This evaluation may be fatiguing and you may wish ti have someone accompany you to the assessment to drive you home afterward. Bring a list of your medications with you, including dosage and frequency you take the medications (  I.e.,once per day, twice per day, etc). Take all medications as prescribed, unless noted below or instructed to do so by your  physician.  Please do not take the following medications prior to your CPX:  _________________________________________________  _________________________________________________  Brief description of the test: A brief lung test will be performed. This will involve you taking deep breaths and blowing hard and fast through your mouth. During these , a clip will be on your nose and you will be breathing through a breathing device.   For the exercise portion of the test you will be walking on a treadmill, or riding a stationary bike, to your maximal effor or until symptoms such as chest pain, shortness of breath, leg pain or dizziness limit your exercise. You will be breathing in and out of a breathing device through your mouth (a clip will be on your nose again). Your heart rate, ECG, blood pressure, oxygen  saturations, breathing rate and depth, amount of oxygen  you consume and amount of carbon dioxide you produce will be measured and monitored throughout the exercise test.  If you need to cancel or reschedule your appointment please call [phone] If you have further questions please call your physician or Damien Nunnery at [phone]   Special Instructions // Education:  Do the following things EVERYDAY: Weigh yourself in the morning before breakfast. Write it down and keep it in a log. Take your medicines as prescribed Eat low salt foods--Limit salt (sodium) to 2000 mg per day.  Stay as active as you can everyday Limit all fluids for the day to less than 2 liters   Follow-Up in: 6 months   At the [facility], you and your health needs  are our priority. We have a designated team specialized in the treatment of Heart Failure. This Care Team includes your primary Heart Failure Specialized Cardiologist (physician), Advanced Practice Providers (APPs- Physician Assistants and Nurse Practitioners), and Pharmacist who all work together to provide you with the care you need,  when you need it.   You may see any of the following providers on your designated Care Team at your next follow up:  Dr. Toribio Fuel Dr. Ezra Shuck Dr. Odis Brownie Greig Mosses, NP Caffie Shed, [place] [facility] [place], [place] Beckey Coe, NP Jordan Lee, NP Tinnie Redman, PharmD   Please be sure to bring in all your medications bottles to every appointment.   Need to Contact Us :  If you have any questions or concerns before your next appointment please send us  a message through [facility] or call our office at [phone].    TO LEAVE A MESSAGE FOR THE NURSE SELECT OPTION 2, PLEASE LEAVE A MESSAGE INCLUDING: YOUR NAME DATE OF BIRTH CALL BACK NUMBER REASON FOR CALL**this is important as we prioritize the call backs  YOU WILL RECEIVE A CALL BACK THE SAME DAY AS LONG AS YOU CALL BEFORE 4:00 PM

## 2024-12-05 NOTE — Progress Notes (Signed)
 Paramedicine Encounter   Patient ID: Jocelyn [name] , female,   DOB: 1956/04/30,68 y.o.,  MRN: [id number]   Met patient in clinic today with provider.  Weight @ clinic-159 B/P-134/80 P-62 [id number]  Cardio-pulm stress test is going to be ordered due to her sob upon exertion.   ECHO-better/no new abnormalities  EKG-NSR   Med changes-none today   [name], EMT-Paramedic [phone] 12/05/2024

## 2024-12-06 NOTE — Progress Notes (Signed)
 "  [facility]   Primary Care: Benjamine Aland, MD HF Cardiologist: Dr. Rolan  HPI: 69 y.o. female w/ h/o inferior STEMI on 06/15/2017. She received a drug-eluting stent to her distal right coronary artery. She had mild LAD disease. Echo at that time showed normal LVEF 50-55%, RV normal.  She additionally has history of polymyositis (followed by Dr. Mai), sarcoidosis diagnosed by bronchoscopy in the 1990s (in remission), and asymptomatic meningioma.   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    Admitted 9/24 with PE. CT angio showed saddle pulmonary embolism as well as extensive bilateral emboli. She developed PEA arrest/obstructive shock. Achieved ROSC after ~5 min of CPR, Epi x 1 and TNK. Intubated, started on NE. Initial bedside echo showed LVEF ~30-35%, dilated RV w/ paradoxical septal motion and RV hypokinesis. Underwent aspiration thrombectomy of right and left main and lobar pulmonary arteries by IR. Repeat formal echo showed improved LVEF, 60-65%, + RV McConnell's sign. RV dilated mildly reduced systolic fx. Required milrinone  gtt. Drips eventually weaned.  Limited echo prior to discharge showed EF 60-65%, RV mildly dilated with mild HK. Will require lifelong AC. She was discharged home, weight 173 lbs. She was negative for antiphospholipid antibody syndrome, Factor V Leiden, and prothrombin gene mutation.   Repeat echo was done in 12/24, showing EF 60-65%, mild LVH, borderline RV dysfunction with hypokinesis at apex, no TR jet to estimate PA systolic pressure, IVC normal.  Echo in 1/26 showed EF 65-70%, mild LVH, normal RV, mild MR, IVC normal.   Today she returns for HF follow up with [name] with paramedicine. She is stable symptomatically.  Short of breath after walking about 1/2 block or walking up stairs.  No problems around the house/with ADLs. +Fatigue generally.  No BRBPR/melena.  No chest pain.  No lightheadedness.   ECG (personally  reviewed): NSR, poor RWP  Labs (9/24): K 3.7, creatinine 1.13 Labs (1/25): LDL 141 Labs (4/25): K 4.2, creatinine 1.31 Labs (8/25): K 4.4, creatinine 1.37, hgb 12.2  PMH: 1. Polymyositis: On azathioprine  2. Sarcoidosis: Diagnosed by bronchoscopy in 1980s.  Now in remission.  3. CAD: Inferior STEMI in 8/18, DES to distal RCA.  - Cardiolite in 9/24 with no ischemia/infarction.  4. Meningioma: 6 mm meningioma noted on CT in 9/24, followed by neurology.  5. H/o Meniere's disease 6. Nephrolithiasis 7. Hyperlipidemia 8. Type 2 diabetes 9. Pulmonary embolus: Large saddle embolus in 9/24, no definite trigger.  Treated with mechanical thrombectomy.  Initial RV failure. Negative lupus anticoagulant, anticardiolipin antibody, Factor V Leiden, and prothrombin gene mutation.  - Echo (08/04/23): EF 60-65%, mild LVH, RVOT dilated and mildly reduced, + McConnell's sign. - Ltd echo (08/09/23): EF 60-65%, RV mildly dilated with mild HK. - Echo (12/24): EF 60-65%, mild LVH, borderline RV dysfunction with hypokinesis at apex, no TR jet to estimate PA systolic pressure, IVC normal.  - Echo (1/26): 65-70%, mild LVH, normal RV, mild MR, IVC normal.   Review of Systems: All systems reviewed and negative except as per HPI.   Current Outpatient Medications  Medication Sig Dispense Refill   ALPRAZolam  (XANAX ) 0.5 MG tablet Take 0.5 mg by mouth 3 (three) times daily.     apixaban  (ELIQUIS ) 5 MG TABS tablet Take 1 tablet (5 mg total) by mouth 2 (two) times daily. 180 tablet 3   azaTHIOprine  (IMURAN ) 50 MG tablet Take 1 tablet by mouth daily.     azelastine  (ASTELIN ) 0.1 % nasal spray Place  2 sprays into both nostrils 2 (two) times daily. Use in each nostril as directed 30 mL 12   Bempedoic Acid  (NEXLETOL ) 180 MG TABS Take 1 tablet (180 mg total) by mouth daily. 90 tablet 3   Empagliflozin -metFORMIN  HCl ER (SYNJARDY XR) 25-1000 MG TB24 Take 25-1,000 tablets by mouth daily.     GEMTESA 75 MG TABS Take 1 tablet by  mouth daily.     icosapent  Ethyl (VASCEPA ) 1 g capsule TAKE 2 CAPSULES(2 GRAMS) BY MOUTH TWICE DAILY 360 capsule 2   metoprolol  succinate (TOPROL -XL) 100 MG 24 hr tablet Take 1 tablet (100 mg total) by mouth daily. Take with or immediately following a meal. 90 tablet 3   nitroGLYCERIN  (NITROSTAT ) 0.4 MG SL tablet Place 1 tablet (0.4 mg total) under the tongue every 5 (five) minutes (up to 3 doses) as needed for chest pain. 75 each 2   ONETOUCH VERIO test strip CHECK BLOOD SUGAR TID AS DIRECTED     PRALUENT  150 MG/ML SOAJ ADMINISTER 1 ML UNDER THE SKIN EVERY 14 DAYS 6 mL 3   predniSONE  (DELTASONE ) 20 MG tablet Take 1 tablet (20 mg total) by mouth daily with breakfast. 5 tablet 0   ondansetron  (ZOFRAN ) 4 MG tablet Take 1 tablet (4 mg total) by mouth every 6 (six) hours. (Patient not taking: Reported on 12/05/2024) 12 tablet 0   pantoprazole  (PROTONIX ) 40 MG tablet Take 1 tablet (40 mg total) by mouth daily. (Patient not taking: Reported on 12/05/2024) 30 tablet 0   No current facility-administered medications for this encounter.   Allergies  Allergen Reactions   Actos [Pioglitazone] Other (See Comments)    REACTION: pt states INTOL w/ edema   Codeine  Other (See Comments)    REACTION: vomiting   Januvia [Sitagliptin] Nausea Only   Lactose Intolerance (Gi) Diarrhea   Morphine Nausea Only and Nausea And Vomiting    REACTION: vomiting REACTION: vomiting   Repatha  [Evolocumab ]     WAS NOT EFFECTIVE IN LOWERING LDL   Social History   Socioeconomic History   Marital status: Single    Spouse name: Not on file   Number of children: 1   Years of education: Not on file   Highest education level: Master's degree (e.g., MA, MS, MEng, MEd, MSW, MBA)  Occupational History   Occupation: Retired Runner, Broadcasting/film/video   Tobacco Use   Smoking status: Never   Smokeless tobacco: Never   Tobacco comments:    Daily Caffeine - 1  Exercise 2-3 times/weekly  Vaping Use   Vaping status: Never Used  Substance and  Sexual Activity   Alcohol use: No    Alcohol/week: 0.0 standard drinks of alcohol   Drug use: No   Sexual activity: Yes    Birth control/protection: None  Other Topics Concern   Not on file  Social History Narrative   Exercises 2-3 times weekly   Caffeine Use: 1 daily (tea or Pepsi)   Lives alone in a one story home.  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  Retired midwife.     Social Drivers of Health   Tobacco Use: Low Risk (12/05/2024)   Patient History    Smoking Tobacco Use: Never    Smokeless Tobacco Use: Never    Passive Exposure: Not on file  Financial Resource Strain: Low Risk (08/05/2023)   Overall Financial Resource Strain (CARDIA)    Difficulty of Paying Living Expenses: Not hard at all  Food Insecurity: No Food Insecurity (07/09/2024)   Epic    Worried About  Running Out of Food in the Last Year: Never true    Ran Out of Food in the Last Year: Never true  Transportation Needs: No Transportation Needs (07/09/2024)   Epic    Lack of Transportation (Medical): No    Lack of Transportation (Non-Medical): No  Physical Activity: Not on file  Stress: No Stress Concern Present (08/05/2023)   [employer] of Occupational Health - Occupational Stress Questionnaire    Feeling of Stress : Not at all  Social Connections: Not on file  Intimate Partner Violence: Not At Risk (07/09/2024)   Epic    Fear of Current or Ex-Partner: No    Emotionally Abused: No    Physically Abused: No    Sexually Abused: No  Depression (PHQ2-9): Low Risk (07/09/2024)   Depression (PHQ2-9)    PHQ-2 Score: 0  Alcohol Screen: Low Risk (08/05/2023)   Alcohol Screen    Last Alcohol Screening Score (AUDIT): 0  Housing: Unknown (07/09/2024)   Epic    Unable to Pay for Housing in the Last Year: No    Number of Times Moved in the Last Year: Not on file    Homeless in the Last Year: No  Utilities: Not At Risk (07/09/2024)   Epic    Threatened with loss of utilities: No  Health Literacy: Not on file   Family  History  Adopted: Yes  Problem Relation Age of Onset   Other Other        ADOPTED   Colon cancer Neg Hx    Esophageal cancer Neg Hx    Rectal cancer Neg Hx    Stomach cancer Neg Hx    Emphysema Neg Hx    Seizures Neg Hx    Wt Readings from Last 3 Encounters:  12/05/24 72.1 kg (159 lb)  11/28/24 69.4 kg (153 lb)  10/03/24 68 kg (150 lb)   BP 134/80   Pulse 62   Wt 72.1 kg (159 lb)   SpO2 100%   BMI 29.56 kg/m   PHYSICAL EXAM: General: NAD Neck: No JVD, no thyromegaly or thyroid  nodule.  Lungs: Clear to auscultation bilaterally with normal respiratory effort. CV: Nondisplaced PMI.  Heart regular S1/S2, no S3/S4, no murmur.  No peripheral edema.  No carotid bruit.  Normal pedal pulses.  Abdomen: Soft, nontender, no hepatosplenomegaly, no distention.  Skin: Intact without lesions or rashes.  Neurologic: Alert and oriented x 3.  Psych: Normal affect. Extremities: No clubbing or cyanosis.  HEENT: Normal.   ASSESSMENT & PLAN: 1. PE: Large, hemodynamically significant saddle embolus in 9/24. Had PEA arrest. Had thrombolytics then mechanical aspiration by IR. Trigger for PE uncertain, ambulation limited by knee pain but not sedentary.  No long trips, no known cancer, etc.  Does not know family history (adopted). Hypercoagulable work up negative--> Factor V Leiden, prothrombin gene mutation, and lupus anticoagulant panel. - Continue Eliquis  indefinitely. CBC today.  2. Chronic RV failure: Shock in 9/24 from large saddle embolus.  S/p thrombectomy and started on pressors/inotropes for RV shock. Now seems resolved.  Echo in 12/24 showed EF 60-65%, mild LVH, borderline RV dysfunction with hypokinesis at apex, no TR jet to estimate PA systolic pressure, IVC normal. Echo in 1/26 showed 65-70%, mild LVH, normal RV, mild MR, IVC normal.  NYHA class II-III symptoms.  She is not volume overloaded on exam.  - I have a hard time explaining her dyspnea.  I will arrange for CPX.  - She does not  need Lasix .  -  Continue Jardiance .  - Check BNP.  3. CAD: H/o inferior MI in 8/18 with DES to RCA.  Cardiolite in 9/24 showed no ischemia/infarction. No chest pain. - Continue Praluent , Nexletol , Vascepa . Check lipids today. - No ASA given Eliquis  use.  4. HTN: BP controlled.    Follow up in 6 months with APP  I spent 31 minutes reviewing records, interviewing/examining patient, and managing orders.   Ezra Shuck 12/06/2024  "

## 2024-12-14 NOTE — Progress Notes (Unsigned)
 " [facility] [address] [place] [address] Phone: [phone] Subjective:   Jocelyn [name], am serving as a scribe for Dr. Arthea Claudene.  I'm seeing this patient by the request  of:  Benjamine Aland, MD  CC: Knee pain and back pain.  YEP:Dlagzrupcz  10/15/2024 Scapular dyskinesis noted. Discussed icing regimen and home exercises, patient which activities to do and which ones to avoid. Increase activity slowly. Patient given exercises. Thoracic back pain noted with patient's history of congestive heart failure and pulmonary embolisms noted on chest x-ray to further evaluate. Follow-up recommended. 6 to 8 weeks otherwise.  Chronic problem with worsening symptoms.  Bilateral injections given again today.  Tolerated the procedure well, with discussed that we could consider the possibility of viscosupplementation at the beginning of next year.  Continue to be active, continue to keep weight under control.  Follow-up again in 6 to 8 weeks     Updated 12/18/2024 Jocelyn [name] is a 69 y.o. female coming in with complaint of knee pain, back pain. Injections are helpful for her knees but they are still hurting. Back is doing better.    Since we have seen patient was seen by rheumatology for polymyositis and myopathy.    Past Medical History:  Diagnosis Date   Acute bronchitis    Allergic rhinitis, cause unspecified    Anemia    Anxiety    B12 deficiency    BV (bacterial vaginosis) 06/22/1996   Calculus of kidney    Calculus of kidney    Coronary atherosclerosis of unspecified type of vessel, native or graft    Dizziness    Essential hypertension, benign    Fibroid 2003   Fibromyalgia    H/O dysmenorrhea 2008   H/O varicella    Headache(784.0)    frequently   HSV-2 infection 2009   Hyperplastic colon polyp 05/16/2014   Irritable bowel syndrome    Meniere's disease, unspecified    Menses, irregular 2003   Myalgia and myositis, unspecified     Myocardial infarction (HCC)    Obstructive sleep apnea (adult) (pediatric)    Perimenopausal symptoms 2003   Pure hypercholesterolemia    Sarcoidosis    Type II or unspecified type diabetes mellitus without mention of complication, not stated as uncontrolled    Unspecified venous (peripheral) insufficiency    Vitamin D  deficiency    Vulvitis 2010   Yeast infection    Past Surgical History:  Procedure Laterality Date   COLONOSCOPY     CORONARY STENT INTERVENTION N/A 06/15/2017   Procedure: CORONARY STENT INTERVENTION;  Surgeon: Dann Candyce RAMAN, MD;  Location: [facility];  Service: Cardiovascular;  Laterality: N/A;   heart catherization     IR ANGIOGRAM PULMONARY BILATERAL SELECTIVE  08/04/2023   IR ANGIOGRAM SELECTIVE EACH ADDITIONAL VESSEL  08/04/2023   IR ANGIOGRAM SELECTIVE EACH ADDITIONAL VESSEL  08/04/2023   IR THROMBECT PRIM MECH INIT (INCLU) MOD SED  08/04/2023   IR US  GUIDE VASC ACCESS RIGHT  08/04/2023   LEFT HEART CATH AND CORONARY ANGIOGRAPHY N/A 06/15/2017   Procedure: LEFT HEART CATH AND CORONARY ANGIOGRAPHY;  Surgeon: Dann Candyce RAMAN, MD;  Location: [facility];  Service: Cardiovascular;  Laterality: N/A;   RADIOLOGY WITH ANESTHESIA N/A 08/04/2023   Procedure: IR WITH ANESTHESIA;  Surgeon: Dolphus Carrion, MD;  Location: [facility];  Service: Radiology;  Laterality: N/A;   TONSILLECTOMY     Social History   Socioeconomic History  Marital status: Single    Spouse name: Not on file   Number of children: 1   Years of education: Not on file   Highest education level: Master's degree (e.g., MA, MS, MEng, MEd, MSW, MBA)  Occupational History   Occupation: Retired Runner, Broadcasting/film/video   Tobacco Use   Smoking status: Never   Smokeless tobacco: Never   Tobacco comments:    Daily Caffeine - 1  Exercise 2-3 times/weekly  Vaping Use   Vaping status: Never Used  Substance and Sexual Activity   Alcohol use: No    Alcohol/week: 0.0 standard drinks of alcohol   Drug use:  No   Sexual activity: Yes    Birth control/protection: None  Other Topics Concern   Not on file  Social History Narrative   Exercises 2-3 times weekly   Caffeine Use: 1 daily (tea or Pepsi)   Lives alone in a one story home.  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  Retired midwife.     Social Drivers of Health   Tobacco Use: Low Risk (12/05/2024)   Patient History    Smoking Tobacco Use: Never    Smokeless Tobacco Use: Never    Passive Exposure: Not on file  Financial Resource Strain: Low Risk (08/05/2023)   Overall Financial Resource Strain (CARDIA)    Difficulty of Paying Living Expenses: Not hard at all  Food Insecurity: No Food Insecurity (07/09/2024)   Epic    Worried About Radiation Protection Practitioner of Food in the Last Year: Never true    Ran Out of Food in the Last Year: Never true  Transportation Needs: No Transportation Needs (07/09/2024)   Epic    Lack of Transportation (Medical): No    Lack of Transportation (Non-Medical): No  Physical Activity: Not on file  Stress: No Stress Concern Present (08/05/2023)   [employer] of Occupational Health - Occupational Stress Questionnaire    Feeling of Stress : Not at all  Social Connections: Not on file  Depression (PHQ2-9): Low Risk (07/09/2024)   Depression (PHQ2-9)    PHQ-2 Score: 0  Alcohol Screen: Low Risk (08/05/2023)   Alcohol Screen    Last Alcohol Screening Score (AUDIT): 0  Housing: Unknown (07/09/2024)   Epic    Unable to Pay for Housing in the Last Year: No    Number of Times Moved in the Last Year: Not on file    Homeless in the Last Year: No  Utilities: Not At Risk (07/09/2024)   Epic    Threatened with loss of utilities: No  Health Literacy: Not on file   Allergies[1] Family History  Adopted: Yes  Problem Relation Age of Onset   Other Other        ADOPTED   Colon cancer Neg Hx    Esophageal cancer Neg Hx    Rectal cancer Neg Hx    Stomach cancer Neg Hx    Emphysema Neg Hx    Seizures Neg Hx     Current Outpatient  Medications (Endocrine & Metabolic):    Empagliflozin -metFORMIN  HCl ER (SYNJARDY XR) 25-1000 MG TB24, Take 25-1,000 tablets by mouth daily.   predniSONE  (DELTASONE ) 20 MG tablet, Take 1 tablet (20 mg total) by mouth daily with breakfast.  Current Outpatient Medications (Cardiovascular):    Bempedoic Acid  (NEXLETOL ) 180 MG TABS, Take 1 tablet (180 mg total) by mouth daily.   icosapent  Ethyl (VASCEPA ) 1 g capsule, TAKE 2 CAPSULES(2 GRAMS) BY MOUTH TWICE DAILY   metoprolol  succinate (TOPROL -XL) 100 MG 24 hr tablet, Take  1 tablet (100 mg total) by mouth daily. Take with or immediately following a meal.   nitroGLYCERIN  (NITROSTAT ) 0.4 MG SL tablet, Place 1 tablet (0.4 mg total) under the tongue every 5 (five) minutes (up to 3 doses) as needed for chest pain.   PRALUENT  150 MG/ML SOAJ, ADMINISTER 1 ML UNDER THE SKIN EVERY 14 DAYS  Current Outpatient Medications (Respiratory):    azelastine  (ASTELIN ) 0.1 % nasal spray, Place 2 sprays into both nostrils 2 (two) times daily. Use in each nostril as directed  Current Outpatient Medications (Hematological):    apixaban  (ELIQUIS ) 5 MG TABS tablet, Take 1 tablet (5 mg total) by mouth 2 (two) times daily.  Current Outpatient Medications (Other):    ALPRAZolam  (XANAX ) 0.5 MG tablet, Take 0.5 mg by mouth 3 (three) times daily.   azaTHIOprine  (IMURAN ) 50 MG tablet, Take 1 tablet by mouth daily.   GEMTESA 75 MG TABS, Take 1 tablet by mouth daily.   ondansetron  (ZOFRAN ) 4 MG tablet, Take 1 tablet (4 mg total) by mouth every 6 (six) hours.   ONETOUCH VERIO test strip, CHECK BLOOD SUGAR TID AS DIRECTED   pantoprazole  (PROTONIX ) 40 MG tablet, Take 1 tablet (40 mg total) by mouth daily.   Reviewed prior external information including notes and imaging from  primary care provider As well as notes that were available from care everywhere and other healthcare systems.  Past medical history, social, surgical and family history all reviewed in electronic medical  record.  No pertanent information unless stated regarding to the chief complaint.   Review of Systems:  No headache, visual changes, nausea, vomiting, diarrhea, constipation, dizziness, abdominal pain, skin rash, fevers, chills, night sweats, weight loss, swollen lymph nodes, body aches, joint swelling, chest pain, shortness of breath, mood changes. POSITIVE muscle aches  Objective  Blood pressure 104/72, pulse 69, height 5' 1.5 (1.562 m), weight 158 lb (71.7 kg), SpO2 93%.   General: No apparent distress alert and oriented x3 mood and affect normal, dressed appropriately.  HEENT: Pupils equal, extraocular movements intact  Respiratory: Patient's speak in full sentences and does not appear short of breath  Cardiovascular: No lower extremity edema, non tender, no erythema  Knee exam does show arthritic changes noted.  Crepitus noted.   After informed written and verbal consent, patient was seated on exam table. Right knee was prepped with alcohol swab and utilizing anterolateral approach, patient's right knee space was injected with 48 mg per 3 mL of Monovisc (sodium hyaluronate) in a prefilled syringe was injected easily into the knee through a 22-gauge needle..Patient tolerated the procedure well without immediate complications.  After informed written and verbal consent, patient was seated on exam table. Left knee was prepped with alcohol swab and utilizing anterolateral approach, patient's left knee space was injected with 40 mg per 3 mL of Monovisc (sodium hyaluronate) in a prefilled syringe was injected easily into the knee through a 22-gauge needle..Patient tolerated the procedure well without immediate complications. Impression and Recommendations:    The above documentation has been reviewed and is accurate and complete Arthea CHRISTELLA Sharps, DO        [1]  Allergies Allergen Reactions   Actos [Pioglitazone] Other (See Comments)    REACTION: pt states INTOL w/ edema   Codeine  Other  (See Comments)    REACTION: vomiting   Januvia [Sitagliptin] Nausea Only   Lactose Intolerance (Gi) Diarrhea   Morphine Nausea Only and Nausea And Vomiting    REACTION: vomiting REACTION: vomiting  Repatha  [Evolocumab ]     WAS NOT EFFECTIVE IN LOWERING LDL   "

## 2024-12-17 ENCOUNTER — Encounter (HOSPITAL_COMMUNITY)

## 2024-12-18 ENCOUNTER — Ambulatory Visit: Admitting: Family Medicine

## 2024-12-18 ENCOUNTER — Encounter: Payer: Self-pay | Admitting: Family Medicine

## 2024-12-18 VITALS — BP 104/72 | HR 69 | Ht 61.5 in | Wt 158.0 lb

## 2024-12-18 DIAGNOSIS — M170 Bilateral primary osteoarthritis of knee: Secondary | ICD-10-CM

## 2024-12-18 MED ORDER — HYALURONAN 88 MG/4ML IX SOSY
176.0000 mg | PREFILLED_SYRINGE | Freq: Once | INTRA_ARTICULAR | Status: AC
  Administered 2024-12-18: 176 mg via INTRA_ARTICULAR

## 2024-12-18 NOTE — Assessment & Plan Note (Signed)
 Patient has responded well to this previously and hopeful that patient will respond again.  Last time was in July 2024.  Discussed with patient to continue to stay active.  Patient is doing well with her weight and then would like her to keep it closer to 150 pounds.  We discussed with patient about VMO strengthening.  Follow-up in 6 to 12 weeks otherwise.

## 2025-01-01 ENCOUNTER — Encounter (HOSPITAL_COMMUNITY)

## 2025-01-08 ENCOUNTER — Ambulatory Visit: Admitting: Oncology

## 2025-01-08 ENCOUNTER — Other Ambulatory Visit

## 2025-02-26 ENCOUNTER — Ambulatory Visit: Admitting: Family Medicine

## 2025-03-26 ENCOUNTER — Ambulatory Visit: Admitting: Internal Medicine

## 2025-06-04 ENCOUNTER — Ambulatory Visit (HOSPITAL_COMMUNITY)
